# Patient Record
Sex: Male | Born: 1940
Health system: Southern US, Community
[De-identification: ages and names within clinical notes are randomized; demographics above are authoritative.]

## PROBLEM LIST (undated history)

## (undated) DIAGNOSIS — K5790 Diverticulosis of intestine, part unspecified, without perforation or abscess without bleeding: Secondary | ICD-10-CM

## (undated) DIAGNOSIS — H269 Unspecified cataract: Secondary | ICD-10-CM

## (undated) DIAGNOSIS — I2510 Atherosclerotic heart disease of native coronary artery without angina pectoris: Secondary | ICD-10-CM

## (undated) DIAGNOSIS — R7303 Prediabetes: Secondary | ICD-10-CM

## (undated) DIAGNOSIS — F419 Anxiety disorder, unspecified: Secondary | ICD-10-CM

## (undated) DIAGNOSIS — K529 Noninfective gastroenteritis and colitis, unspecified: Secondary | ICD-10-CM

## (undated) DIAGNOSIS — M109 Gout, unspecified: Secondary | ICD-10-CM

## (undated) DIAGNOSIS — J849 Interstitial pulmonary disease, unspecified: Secondary | ICD-10-CM

## (undated) DIAGNOSIS — Z8619 Personal history of other infectious and parasitic diseases: Secondary | ICD-10-CM

## (undated) DIAGNOSIS — M1990 Unspecified osteoarthritis, unspecified site: Secondary | ICD-10-CM

## (undated) DIAGNOSIS — Z87442 Personal history of urinary calculi: Secondary | ICD-10-CM

## (undated) DIAGNOSIS — K141 Geographic tongue: Secondary | ICD-10-CM

## (undated) DIAGNOSIS — E039 Hypothyroidism, unspecified: Secondary | ICD-10-CM

## (undated) DIAGNOSIS — I739 Peripheral vascular disease, unspecified: Secondary | ICD-10-CM

## (undated) DIAGNOSIS — Z87898 Personal history of other specified conditions: Secondary | ICD-10-CM

## (undated) DIAGNOSIS — L509 Urticaria, unspecified: Secondary | ICD-10-CM

## (undated) DIAGNOSIS — Z9861 Coronary angioplasty status: Secondary | ICD-10-CM

## (undated) DIAGNOSIS — R000 Tachycardia, unspecified: Secondary | ICD-10-CM

## (undated) DIAGNOSIS — Z8719 Personal history of other diseases of the digestive system: Secondary | ICD-10-CM

## (undated) DIAGNOSIS — I219 Acute myocardial infarction, unspecified: Secondary | ICD-10-CM

## (undated) DIAGNOSIS — T783XXA Angioneurotic edema, initial encounter: Secondary | ICD-10-CM

## (undated) DIAGNOSIS — N400 Enlarged prostate without lower urinary tract symptoms: Secondary | ICD-10-CM

## (undated) DIAGNOSIS — Z9889 Other specified postprocedural states: Secondary | ICD-10-CM

## (undated) DIAGNOSIS — I10 Essential (primary) hypertension: Secondary | ICD-10-CM

## (undated) DIAGNOSIS — K219 Gastro-esophageal reflux disease without esophagitis: Secondary | ICD-10-CM

## (undated) DIAGNOSIS — E7800 Pure hypercholesterolemia, unspecified: Secondary | ICD-10-CM

## (undated) DIAGNOSIS — C4490 Unspecified malignant neoplasm of skin, unspecified: Secondary | ICD-10-CM

## (undated) DIAGNOSIS — C61 Malignant neoplasm of prostate: Secondary | ICD-10-CM

## (undated) DIAGNOSIS — E118 Type 2 diabetes mellitus with unspecified complications: Secondary | ICD-10-CM

## (undated) DIAGNOSIS — E079 Disorder of thyroid, unspecified: Secondary | ICD-10-CM

## (undated) DIAGNOSIS — I499 Cardiac arrhythmia, unspecified: Secondary | ICD-10-CM

## (undated) DIAGNOSIS — Z8546 Personal history of malignant neoplasm of prostate: Secondary | ICD-10-CM

## (undated) DIAGNOSIS — K5731 Diverticulosis of large intestine without perforation or abscess with bleeding: Secondary | ICD-10-CM

## (undated) HISTORY — DX: Anxiety disorder, unspecified: F41.9

## (undated) HISTORY — DX: Tachycardia, unspecified: R00.0

## (undated) HISTORY — DX: Diverticulosis of large intestine without perforation or abscess with bleeding: K57.31

## (undated) HISTORY — DX: Noninfective gastroenteritis and colitis, unspecified: K52.9

## (undated) HISTORY — DX: Personal history of other specified conditions: Z87.898

## (undated) HISTORY — DX: Atherosclerotic heart disease of native coronary artery without angina pectoris: I25.10

## (undated) HISTORY — DX: Diverticulosis of intestine, part unspecified, without perforation or abscess without bleeding: K57.90

## (undated) HISTORY — DX: Personal history of malignant neoplasm of prostate: Z85.46

## (undated) HISTORY — DX: Coronary angioplasty status: Z98.61

## (undated) HISTORY — DX: Unspecified cataract: H26.9

## (undated) HISTORY — PX: CATARACT EXTRACTION, BILATERAL: SHX1313

## (undated) HISTORY — DX: Acute myocardial infarction, unspecified: I21.9

## (undated) HISTORY — PX: APPENDECTOMY: SHX54

## (undated) HISTORY — DX: Personal history of other diseases of the digestive system: Z87.19

## (undated) HISTORY — PX: NASAL SINUS SURGERY: SHX719

## (undated) HISTORY — PX: PROSTATE BIOPSY: SHX241

## (undated) HISTORY — DX: Geographic tongue: K14.1

## (undated) HISTORY — PX: BACK SURGERY: SHX140

## (undated) HISTORY — DX: Angioneurotic edema, initial encounter: T78.3XXA

## (undated) HISTORY — DX: Urticaria, unspecified: L50.9

## (undated) HISTORY — DX: Essential (primary) hypertension: I10

## (undated) HISTORY — DX: Type 2 diabetes mellitus with unspecified complications: E11.8

## (undated) HISTORY — DX: Malignant neoplasm of prostate: C61

## (undated) HISTORY — DX: Unspecified osteoarthritis, unspecified site: M19.90

## (undated) HISTORY — DX: Gout, unspecified: M10.9

## (undated) HISTORY — PX: OTHER SURGICAL HISTORY: SHX169

---

## 2011-05-09 DIAGNOSIS — H259 Unspecified age-related cataract: Secondary | ICD-10-CM | POA: Diagnosis not present

## 2011-06-20 DIAGNOSIS — E039 Hypothyroidism, unspecified: Secondary | ICD-10-CM | POA: Diagnosis not present

## 2011-06-20 DIAGNOSIS — K589 Irritable bowel syndrome without diarrhea: Secondary | ICD-10-CM | POA: Diagnosis not present

## 2011-06-20 DIAGNOSIS — E785 Hyperlipidemia, unspecified: Secondary | ICD-10-CM | POA: Diagnosis not present

## 2011-06-20 DIAGNOSIS — Z6829 Body mass index (BMI) 29.0-29.9, adult: Secondary | ICD-10-CM | POA: Diagnosis not present

## 2011-07-31 DIAGNOSIS — Z6827 Body mass index (BMI) 27.0-27.9, adult: Secondary | ICD-10-CM | POA: Diagnosis not present

## 2011-07-31 DIAGNOSIS — J019 Acute sinusitis, unspecified: Secondary | ICD-10-CM | POA: Diagnosis not present

## 2011-07-31 DIAGNOSIS — J309 Allergic rhinitis, unspecified: Secondary | ICD-10-CM | POA: Diagnosis not present

## 2011-08-04 DIAGNOSIS — Z6826 Body mass index (BMI) 26.0-26.9, adult: Secondary | ICD-10-CM | POA: Diagnosis not present

## 2011-08-04 DIAGNOSIS — J209 Acute bronchitis, unspecified: Secondary | ICD-10-CM | POA: Diagnosis not present

## 2011-08-09 DIAGNOSIS — J209 Acute bronchitis, unspecified: Secondary | ICD-10-CM | POA: Diagnosis not present

## 2011-08-09 DIAGNOSIS — K1370 Unspecified lesions of oral mucosa: Secondary | ICD-10-CM | POA: Diagnosis not present

## 2011-08-09 DIAGNOSIS — Z6826 Body mass index (BMI) 26.0-26.9, adult: Secondary | ICD-10-CM | POA: Diagnosis not present

## 2011-08-15 DIAGNOSIS — K5900 Constipation, unspecified: Secondary | ICD-10-CM | POA: Diagnosis not present

## 2011-08-30 DIAGNOSIS — R079 Chest pain, unspecified: Secondary | ICD-10-CM | POA: Diagnosis not present

## 2011-08-30 DIAGNOSIS — J209 Acute bronchitis, unspecified: Secondary | ICD-10-CM | POA: Diagnosis not present

## 2011-08-31 DIAGNOSIS — Z6826 Body mass index (BMI) 26.0-26.9, adult: Secondary | ICD-10-CM | POA: Diagnosis not present

## 2011-08-31 DIAGNOSIS — R079 Chest pain, unspecified: Secondary | ICD-10-CM | POA: Diagnosis not present

## 2011-09-27 DIAGNOSIS — K1370 Unspecified lesions of oral mucosa: Secondary | ICD-10-CM | POA: Diagnosis not present

## 2011-09-27 DIAGNOSIS — Z6826 Body mass index (BMI) 26.0-26.9, adult: Secondary | ICD-10-CM | POA: Diagnosis not present

## 2011-11-07 DIAGNOSIS — K1370 Unspecified lesions of oral mucosa: Secondary | ICD-10-CM | POA: Diagnosis not present

## 2011-11-07 DIAGNOSIS — Z6827 Body mass index (BMI) 27.0-27.9, adult: Secondary | ICD-10-CM | POA: Diagnosis not present

## 2011-11-07 DIAGNOSIS — B370 Candidal stomatitis: Secondary | ICD-10-CM | POA: Diagnosis not present

## 2011-12-18 DIAGNOSIS — J384 Edema of larynx: Secondary | ICD-10-CM | POA: Diagnosis not present

## 2011-12-18 DIAGNOSIS — J387 Other diseases of larynx: Secondary | ICD-10-CM | POA: Diagnosis not present

## 2011-12-18 DIAGNOSIS — K1370 Unspecified lesions of oral mucosa: Secondary | ICD-10-CM | POA: Diagnosis not present

## 2011-12-18 DIAGNOSIS — Z8619 Personal history of other infectious and parasitic diseases: Secondary | ICD-10-CM | POA: Diagnosis not present

## 2011-12-18 DIAGNOSIS — R490 Dysphonia: Secondary | ICD-10-CM | POA: Diagnosis not present

## 2012-01-25 DIAGNOSIS — Z79899 Other long term (current) drug therapy: Secondary | ICD-10-CM | POA: Diagnosis not present

## 2012-01-25 DIAGNOSIS — Z0000 Encounter for general adult medical examination without abnormal findings: Secondary | ICD-10-CM | POA: Diagnosis not present

## 2012-01-25 DIAGNOSIS — Z125 Encounter for screening for malignant neoplasm of prostate: Secondary | ICD-10-CM | POA: Diagnosis not present

## 2012-01-25 DIAGNOSIS — E785 Hyperlipidemia, unspecified: Secondary | ICD-10-CM | POA: Diagnosis not present

## 2012-01-25 DIAGNOSIS — R7309 Other abnormal glucose: Secondary | ICD-10-CM | POA: Diagnosis not present

## 2012-01-25 DIAGNOSIS — Z1212 Encounter for screening for malignant neoplasm of rectum: Secondary | ICD-10-CM | POA: Diagnosis not present

## 2012-01-25 DIAGNOSIS — E039 Hypothyroidism, unspecified: Secondary | ICD-10-CM | POA: Diagnosis not present

## 2012-01-29 DIAGNOSIS — L821 Other seborrheic keratosis: Secondary | ICD-10-CM | POA: Diagnosis not present

## 2012-01-29 DIAGNOSIS — L578 Other skin changes due to chronic exposure to nonionizing radiation: Secondary | ICD-10-CM | POA: Diagnosis not present

## 2012-01-29 DIAGNOSIS — L570 Actinic keratosis: Secondary | ICD-10-CM | POA: Diagnosis not present

## 2012-05-09 DIAGNOSIS — R112 Nausea with vomiting, unspecified: Secondary | ICD-10-CM | POA: Diagnosis not present

## 2012-05-09 DIAGNOSIS — E039 Hypothyroidism, unspecified: Secondary | ICD-10-CM | POA: Diagnosis not present

## 2012-05-09 DIAGNOSIS — A045 Campylobacter enteritis: Secondary | ICD-10-CM | POA: Diagnosis not present

## 2012-05-09 DIAGNOSIS — R1115 Cyclical vomiting syndrome unrelated to migraine: Secondary | ICD-10-CM | POA: Diagnosis not present

## 2012-05-09 DIAGNOSIS — R110 Nausea: Secondary | ICD-10-CM | POA: Diagnosis not present

## 2012-05-09 DIAGNOSIS — K219 Gastro-esophageal reflux disease without esophagitis: Secondary | ICD-10-CM | POA: Diagnosis not present

## 2012-05-09 DIAGNOSIS — R5381 Other malaise: Secondary | ICD-10-CM | POA: Diagnosis not present

## 2012-05-09 DIAGNOSIS — R1084 Generalized abdominal pain: Secondary | ICD-10-CM | POA: Diagnosis not present

## 2012-05-09 DIAGNOSIS — A0472 Enterocolitis due to Clostridium difficile, not specified as recurrent: Secondary | ICD-10-CM | POA: Diagnosis not present

## 2012-05-09 DIAGNOSIS — K589 Irritable bowel syndrome without diarrhea: Secondary | ICD-10-CM | POA: Diagnosis not present

## 2012-05-09 DIAGNOSIS — R911 Solitary pulmonary nodule: Secondary | ICD-10-CM | POA: Diagnosis not present

## 2012-05-09 DIAGNOSIS — E876 Hypokalemia: Secondary | ICD-10-CM | POA: Diagnosis not present

## 2012-05-09 DIAGNOSIS — E860 Dehydration: Secondary | ICD-10-CM | POA: Diagnosis not present

## 2012-05-10 DIAGNOSIS — R1084 Generalized abdominal pain: Secondary | ICD-10-CM | POA: Diagnosis not present

## 2012-05-10 DIAGNOSIS — A0472 Enterocolitis due to Clostridium difficile, not specified as recurrent: Secondary | ICD-10-CM | POA: Diagnosis not present

## 2012-05-10 DIAGNOSIS — E039 Hypothyroidism, unspecified: Secondary | ICD-10-CM | POA: Diagnosis not present

## 2012-05-10 DIAGNOSIS — K589 Irritable bowel syndrome without diarrhea: Secondary | ICD-10-CM | POA: Diagnosis present

## 2012-05-10 DIAGNOSIS — R5381 Other malaise: Secondary | ICD-10-CM | POA: Diagnosis not present

## 2012-05-10 DIAGNOSIS — R1115 Cyclical vomiting syndrome unrelated to migraine: Secondary | ICD-10-CM | POA: Diagnosis not present

## 2012-05-10 DIAGNOSIS — A09 Infectious gastroenteritis and colitis, unspecified: Secondary | ICD-10-CM | POA: Diagnosis not present

## 2012-05-10 DIAGNOSIS — R197 Diarrhea, unspecified: Secondary | ICD-10-CM | POA: Diagnosis not present

## 2012-05-10 DIAGNOSIS — Z79899 Other long term (current) drug therapy: Secondary | ICD-10-CM | POA: Diagnosis not present

## 2012-05-10 DIAGNOSIS — K219 Gastro-esophageal reflux disease without esophagitis: Secondary | ICD-10-CM | POA: Diagnosis not present

## 2012-05-10 DIAGNOSIS — R112 Nausea with vomiting, unspecified: Secondary | ICD-10-CM | POA: Diagnosis not present

## 2012-05-10 DIAGNOSIS — K432 Incisional hernia without obstruction or gangrene: Secondary | ICD-10-CM | POA: Diagnosis present

## 2012-05-10 DIAGNOSIS — A045 Campylobacter enteritis: Secondary | ICD-10-CM | POA: Diagnosis not present

## 2012-05-10 DIAGNOSIS — N210 Calculus in bladder: Secondary | ICD-10-CM | POA: Diagnosis present

## 2012-05-10 DIAGNOSIS — E785 Hyperlipidemia, unspecified: Secondary | ICD-10-CM | POA: Diagnosis present

## 2012-05-10 DIAGNOSIS — E860 Dehydration: Secondary | ICD-10-CM | POA: Diagnosis present

## 2012-05-10 DIAGNOSIS — E876 Hypokalemia: Secondary | ICD-10-CM | POA: Diagnosis not present

## 2012-05-10 DIAGNOSIS — Z86010 Personal history of colonic polyps: Secondary | ICD-10-CM | POA: Diagnosis not present

## 2012-05-10 DIAGNOSIS — R110 Nausea: Secondary | ICD-10-CM | POA: Diagnosis not present

## 2012-05-10 DIAGNOSIS — R911 Solitary pulmonary nodule: Secondary | ICD-10-CM | POA: Diagnosis not present

## 2012-05-23 DIAGNOSIS — Z6826 Body mass index (BMI) 26.0-26.9, adult: Secondary | ICD-10-CM | POA: Diagnosis not present

## 2012-05-23 DIAGNOSIS — K5289 Other specified noninfective gastroenteritis and colitis: Secondary | ICD-10-CM | POA: Diagnosis not present

## 2012-05-23 DIAGNOSIS — K1370 Unspecified lesions of oral mucosa: Secondary | ICD-10-CM | POA: Diagnosis not present

## 2012-05-23 DIAGNOSIS — G4700 Insomnia, unspecified: Secondary | ICD-10-CM | POA: Diagnosis not present

## 2012-06-04 DIAGNOSIS — Z1211 Encounter for screening for malignant neoplasm of colon: Secondary | ICD-10-CM | POA: Diagnosis not present

## 2012-06-04 DIAGNOSIS — A0472 Enterocolitis due to Clostridium difficile, not specified as recurrent: Secondary | ICD-10-CM | POA: Diagnosis not present

## 2012-06-04 DIAGNOSIS — K589 Irritable bowel syndrome without diarrhea: Secondary | ICD-10-CM | POA: Diagnosis not present

## 2012-06-04 DIAGNOSIS — Z86010 Personal history of colonic polyps: Secondary | ICD-10-CM | POA: Diagnosis not present

## 2012-06-27 DIAGNOSIS — M79609 Pain in unspecified limb: Secondary | ICD-10-CM | POA: Diagnosis not present

## 2012-06-27 DIAGNOSIS — G4700 Insomnia, unspecified: Secondary | ICD-10-CM | POA: Diagnosis not present

## 2012-06-27 DIAGNOSIS — R7309 Other abnormal glucose: Secondary | ICD-10-CM | POA: Diagnosis not present

## 2012-06-27 DIAGNOSIS — E785 Hyperlipidemia, unspecified: Secondary | ICD-10-CM | POA: Diagnosis not present

## 2012-07-01 DIAGNOSIS — M24573 Contracture, unspecified ankle: Secondary | ICD-10-CM | POA: Diagnosis not present

## 2012-07-01 DIAGNOSIS — M722 Plantar fascial fibromatosis: Secondary | ICD-10-CM | POA: Diagnosis not present

## 2012-07-01 DIAGNOSIS — M24576 Contracture, unspecified foot: Secondary | ICD-10-CM | POA: Diagnosis not present

## 2012-07-01 DIAGNOSIS — M7730 Calcaneal spur, unspecified foot: Secondary | ICD-10-CM | POA: Diagnosis not present

## 2012-07-01 DIAGNOSIS — M7150 Other bursitis, not elsewhere classified, unspecified site: Secondary | ICD-10-CM | POA: Diagnosis not present

## 2012-07-11 DIAGNOSIS — K5730 Diverticulosis of large intestine without perforation or abscess without bleeding: Secondary | ICD-10-CM | POA: Diagnosis not present

## 2012-07-11 DIAGNOSIS — E039 Hypothyroidism, unspecified: Secondary | ICD-10-CM | POA: Diagnosis not present

## 2012-07-11 DIAGNOSIS — Z1211 Encounter for screening for malignant neoplasm of colon: Secondary | ICD-10-CM | POA: Diagnosis not present

## 2012-07-11 DIAGNOSIS — D126 Benign neoplasm of colon, unspecified: Secondary | ICD-10-CM | POA: Diagnosis not present

## 2012-07-11 DIAGNOSIS — Z86010 Personal history of colonic polyps: Secondary | ICD-10-CM | POA: Diagnosis not present

## 2012-07-11 DIAGNOSIS — R197 Diarrhea, unspecified: Secondary | ICD-10-CM | POA: Diagnosis not present

## 2012-07-11 DIAGNOSIS — E785 Hyperlipidemia, unspecified: Secondary | ICD-10-CM | POA: Diagnosis not present

## 2012-07-11 DIAGNOSIS — Z79899 Other long term (current) drug therapy: Secondary | ICD-10-CM | POA: Diagnosis not present

## 2012-07-11 DIAGNOSIS — K648 Other hemorrhoids: Secondary | ICD-10-CM | POA: Diagnosis not present

## 2012-07-11 DIAGNOSIS — K589 Irritable bowel syndrome without diarrhea: Secondary | ICD-10-CM | POA: Diagnosis not present

## 2012-08-08 DIAGNOSIS — L988 Other specified disorders of the skin and subcutaneous tissue: Secondary | ICD-10-CM | POA: Diagnosis not present

## 2012-08-08 DIAGNOSIS — Z6826 Body mass index (BMI) 26.0-26.9, adult: Secondary | ICD-10-CM | POA: Diagnosis not present

## 2012-08-08 DIAGNOSIS — R21 Rash and other nonspecific skin eruption: Secondary | ICD-10-CM | POA: Diagnosis not present

## 2012-09-06 DIAGNOSIS — Z6826 Body mass index (BMI) 26.0-26.9, adult: Secondary | ICD-10-CM | POA: Diagnosis not present

## 2012-09-06 DIAGNOSIS — R21 Rash and other nonspecific skin eruption: Secondary | ICD-10-CM | POA: Diagnosis not present

## 2012-09-09 DIAGNOSIS — I8310 Varicose veins of unspecified lower extremity with inflammation: Secondary | ICD-10-CM | POA: Diagnosis not present

## 2012-09-09 DIAGNOSIS — R609 Edema, unspecified: Secondary | ICD-10-CM | POA: Diagnosis not present

## 2012-09-14 DIAGNOSIS — L259 Unspecified contact dermatitis, unspecified cause: Secondary | ICD-10-CM | POA: Diagnosis not present

## 2012-09-24 DIAGNOSIS — Z6826 Body mass index (BMI) 26.0-26.9, adult: Secondary | ICD-10-CM | POA: Diagnosis not present

## 2012-09-24 DIAGNOSIS — R197 Diarrhea, unspecified: Secondary | ICD-10-CM | POA: Diagnosis not present

## 2012-10-22 DIAGNOSIS — A0472 Enterocolitis due to Clostridium difficile, not specified as recurrent: Secondary | ICD-10-CM | POA: Diagnosis not present

## 2012-10-22 DIAGNOSIS — K589 Irritable bowel syndrome without diarrhea: Secondary | ICD-10-CM | POA: Diagnosis not present

## 2012-10-22 DIAGNOSIS — K648 Other hemorrhoids: Secondary | ICD-10-CM | POA: Diagnosis not present

## 2012-10-27 ENCOUNTER — Emergency Department (HOSPITAL_COMMUNITY): Payer: Medicare Other

## 2012-10-27 ENCOUNTER — Emergency Department (HOSPITAL_COMMUNITY)
Admission: EM | Admit: 2012-10-27 | Discharge: 2012-10-28 | Disposition: A | Discharge status: Home / Self Care | Payer: Medicare Other | Attending: Emergency Medicine | Admitting: Emergency Medicine

## 2012-10-27 ENCOUNTER — Encounter (HOSPITAL_COMMUNITY): Payer: Self-pay | Admitting: Emergency Medicine

## 2012-10-27 DIAGNOSIS — K219 Gastro-esophageal reflux disease without esophagitis: Secondary | ICD-10-CM | POA: Diagnosis not present

## 2012-10-27 DIAGNOSIS — R5381 Other malaise: Secondary | ICD-10-CM | POA: Insufficient documentation

## 2012-10-27 DIAGNOSIS — R109 Unspecified abdominal pain: Secondary | ICD-10-CM

## 2012-10-27 DIAGNOSIS — E079 Disorder of thyroid, unspecified: Secondary | ICD-10-CM | POA: Diagnosis not present

## 2012-10-27 DIAGNOSIS — E7800 Pure hypercholesterolemia, unspecified: Secondary | ICD-10-CM | POA: Insufficient documentation

## 2012-10-27 DIAGNOSIS — R059 Cough, unspecified: Secondary | ICD-10-CM | POA: Insufficient documentation

## 2012-10-27 DIAGNOSIS — K4090 Unilateral inguinal hernia, without obstruction or gangrene, not specified as recurrent: Secondary | ICD-10-CM | POA: Diagnosis not present

## 2012-10-27 DIAGNOSIS — R05 Cough: Secondary | ICD-10-CM | POA: Insufficient documentation

## 2012-10-27 DIAGNOSIS — R5383 Other fatigue: Secondary | ICD-10-CM | POA: Insufficient documentation

## 2012-10-27 DIAGNOSIS — R079 Chest pain, unspecified: Secondary | ICD-10-CM | POA: Diagnosis not present

## 2012-10-27 DIAGNOSIS — IMO0002 Reserved for concepts with insufficient information to code with codable children: Secondary | ICD-10-CM | POA: Insufficient documentation

## 2012-10-27 DIAGNOSIS — R198 Other specified symptoms and signs involving the digestive system and abdomen: Secondary | ICD-10-CM | POA: Diagnosis not present

## 2012-10-27 DIAGNOSIS — R0789 Other chest pain: Secondary | ICD-10-CM | POA: Insufficient documentation

## 2012-10-27 DIAGNOSIS — N201 Calculus of ureter: Secondary | ICD-10-CM | POA: Diagnosis not present

## 2012-10-27 DIAGNOSIS — R1084 Generalized abdominal pain: Secondary | ICD-10-CM | POA: Diagnosis not present

## 2012-10-27 DIAGNOSIS — Z79899 Other long term (current) drug therapy: Secondary | ICD-10-CM | POA: Diagnosis not present

## 2012-10-27 HISTORY — DX: Disorder of thyroid, unspecified: E07.9

## 2012-10-27 HISTORY — DX: Gastro-esophageal reflux disease without esophagitis: K21.9

## 2012-10-27 HISTORY — DX: Benign prostatic hyperplasia without lower urinary tract symptoms: N40.0

## 2012-10-27 HISTORY — DX: Pure hypercholesterolemia, unspecified: E78.00

## 2012-10-27 LAB — HEPATIC FUNCTION PANEL
ALT: 26 U/L (ref 0–53)
AST: 24 U/L (ref 0–37)
Indirect Bilirubin: 0.4 mg/dL (ref 0.3–0.9)
Total Protein: 6.5 g/dL (ref 6.0–8.3)

## 2012-10-27 LAB — URINE MICROSCOPIC-ADD ON

## 2012-10-27 LAB — CBC
HCT: 48.4 % (ref 39.0–52.0)
Hemoglobin: 17 g/dL (ref 13.0–17.0)
MCH: 32.1 pg (ref 26.0–34.0)
MCHC: 35.1 g/dL (ref 30.0–36.0)
MCV: 91.5 fL (ref 78.0–100.0)
Platelets: 152 K/uL (ref 150–400)
RBC: 5.29 MIL/uL (ref 4.22–5.81)
RDW: 12.5 % (ref 11.5–15.5)
WBC: 6.5 10*3/uL (ref 4.0–10.5)

## 2012-10-27 LAB — BASIC METABOLIC PANEL
BUN: 16 mg/dL (ref 6–23)
CO2: 25 mEq/L (ref 19–32)
Chloride: 102 mEq/L (ref 96–112)
Creatinine, Ser: 1.01 mg/dL (ref 0.50–1.35)

## 2012-10-27 LAB — BASIC METABOLIC PANEL WITH GFR
Calcium: 9.1 mg/dL (ref 8.4–10.5)
GFR calc Af Amer: 82 mL/min — ABNORMAL LOW (ref >90)
GFR calc non Af Amer: 71 mL/min — ABNORMAL LOW (ref >90)
Glucose, Bld: 122 mg/dL — ABNORMAL HIGH (ref 70–99)
Potassium: 3.9 meq/L (ref 3.5–5.1)
Sodium: 136 meq/L (ref 135–145)

## 2012-10-27 LAB — LIPASE, BLOOD: Lipase: 30 U/L (ref 11–59)

## 2012-10-27 LAB — PRO B NATRIURETIC PEPTIDE: Pro B Natriuretic peptide (BNP): 35.7 pg/mL (ref 0–125)

## 2012-10-27 LAB — URINALYSIS, ROUTINE W REFLEX MICROSCOPIC
Bilirubin Urine: NEGATIVE
Hgb urine dipstick: NEGATIVE
Nitrite: NEGATIVE
Specific Gravity, Urine: 1.01 (ref 1.005–1.030)
pH: 7.5 (ref 5.0–8.0)

## 2012-10-27 MED ORDER — IOHEXOL 300 MG/ML  SOLN
100.0000 mL | Freq: Once | INTRAMUSCULAR | Status: AC | PRN
  Administered 2012-10-27: 100 mL via INTRAVENOUS

## 2012-10-27 NOTE — ED Notes (Signed)
PT. REPORTS LEFT LOWER CHEST PAIN WITH SLIGHT SOB , OCCASIONAL PRODUCTIVE COUGH ONSET LAST NIGHT , DENIES NAUSEA OR DIAPHORESIS .

## 2012-10-27 NOTE — ED Provider Notes (Signed)
CSN: [id number]     Arrival date & time 10/27/12  2053 History     First MD Initiated Contact with Patient 10/27/12 2204     Chief Complaint  Patient presents with  . Chest Pain   (Consider location/radiation/quality/duration/timing/severity/associated sxs/prior Treatment) Patient is a 72 y.o. male presenting with abdominal pain. The history is provided by the patient and the spouse.  Abdominal Pain This is a new (Left upper quadrant) problem. The current episode started yesterday (Last night). The problem occurs constantly. The problem has been gradually worsening. Associated symptoms include abdominal pain, a change in bowel habit (Patient took Maalox to improve symptoms, but has had no relief and now has soft stool), chest pain and coughing. Pertinent negatives include no anorexia, arthralgias, chills, congestion, diaphoresis, fatigue, fever, headaches, joint swelling, myalgias, nausea, neck pain, numbness, rash, sore throat, swollen glands, urinary symptoms, visual change, vomiting or weakness. Nothing aggravates the symptoms. He has tried nothing for the symptoms.    Past Medical History  Diagnosis Date  . Thyroid disease   . Prostate enlargement   . GERD (gastroesophageal reflux disease)   . High cholesterol    History reviewed. No pertinent past surgical history. No family history on file. History  Substance Use Topics  . Smoking status: Never Smoker   . Smokeless tobacco: Not on file  . Alcohol Use: No    Review of Systems  Constitutional: Negative for fever, chills, diaphoresis, activity change, appetite change and fatigue.  HENT: Negative for congestion, sore throat, rhinorrhea, sneezing, trouble swallowing and neck pain.   Eyes: Negative for pain and redness.  Respiratory: Positive for cough and chest tightness. Negative for choking, shortness of breath, wheezing and stridor.   Cardiovascular: Positive for chest pain. Negative for leg swelling.  Gastrointestinal:  Positive for abdominal pain and change in bowel habit (Patient took Maalox to improve symptoms, but has had no relief and now has soft stool). Negative for nausea, vomiting, diarrhea, constipation, blood in stool, abdominal distention, anal bleeding and anorexia.  Musculoskeletal: Negative for myalgias, back pain, joint swelling and arthralgias.  Skin: Negative for rash.  Neurological: Negative for dizziness, speech difficulty, weakness, light-headedness, numbness and headaches.  Hematological: Negative for adenopathy.  Psychiatric/Behavioral: Negative for confusion.    Allergies  Benadryl; Darvocet; and Darvon  Home Medications   Current Outpatient Rx  Name  Route  Sig  Dispense  Refill  . dutasteride (AVODART) 0.5 MG capsule   Oral   Take 0.5 mg by mouth every evening.         . fish oil-omega-3 fatty acids 1000 MG capsule   Oral   Take 1 g by mouth daily.         . fluticasone (VERAMYST) 27.5 MCG/SPRAY nasal spray   Nasal   Place 1 spray into the nose daily.         [name] levothyroxine (SYNTHROID, LEVOTHROID) 75 MCG tablet   Oral   Take 75 mcg by mouth daily before breakfast.         . nabumetone (RELAFEN) 750 MG tablet   Oral   Take 750 mg by mouth 2 (two) times daily.         [name] omeprazole (PRILOSEC) 20 MG capsule   Oral   Take 20 mg by mouth daily.         . Pitavastatin Calcium (LIVALO) 4 MG TABS   Oral   Take 4 mg by mouth every evening.         [name]  Probiotic Product (PROBIOTIC FORMULA PO)   Oral   Take 1 capsule by mouth daily.          BP 155/95  Pulse 80  Temp(Src) 98.3 F (36.8 C) (Oral)  Resp 12  SpO2 98% Physical Exam  Nursing note and vitals reviewed. Constitutional: He is oriented to person, place, and time. He appears well-developed and well-nourished. No distress.  HENT:  Head: Normocephalic and atraumatic.  Eyes: Conjunctivae and EOM are normal. Right eye exhibits no discharge. Left eye exhibits no discharge.  Neck: Normal range  of motion. Neck supple. No tracheal deviation present.  Cardiovascular: Normal rate, regular rhythm and normal heart sounds.  Exam reveals no friction rub.   No murmur heard. Pulmonary/Chest: Effort normal and breath sounds normal. No stridor. No respiratory distress. He has no wheezes. He has no rales. He exhibits no tenderness.  Abdominal: Soft. He exhibits no distension. There is tenderness (Generalized). There is guarding (voluntary). There is no rebound.  Neurological: He is alert and oriented to person, place, and time.  Skin: Skin is warm.  Psychiatric: He has a normal mood and affect.    ED Course   Procedures (including critical care time)  Labs Reviewed  BASIC METABOLIC PANEL - Abnormal; Notable for the following:    Glucose, Bld 122 (*)    GFR calc non Af Amer 71 (*)    GFR calc Af Amer 82 (*)    All other components within normal limits  URINALYSIS, ROUTINE W REFLEX MICROSCOPIC - Abnormal; Notable for the following:    APPearance CLOUDY (*)    Leukocytes, UA MODERATE (*)    All other components within normal limits  URINE MICROSCOPIC-ADD ON - Abnormal; Notable for the following:    Squamous Epithelial / LPF FEW (*)    Bacteria, UA FEW (*)    All other components within normal limits  URINE CULTURE  CBC  PRO B NATRIURETIC PEPTIDE  LIPASE, BLOOD  HEPATIC FUNCTION PANEL  POCT I-STAT TROPONIN I   Results for orders placed during the hospital encounter of 10/27/12  CBC      Result Value Range   WBC 6.5  4.0 - 10.5 K/uL   RBC 5.29  4.22 - 5.81 MIL/uL   Hemoglobin 17.0  13.0 - 17.0 g/dL   HCT 65.7  84.6 - 96.2 %   MCV 91.5  78.0 - 100.0 fL   MCH 32.1  26.0 - 34.0 pg   MCHC 35.1  30.0 - 36.0 g/dL   RDW 95.2  84.1 - 32.4 %   Platelets 152  150 - 400 K/uL  BASIC METABOLIC PANEL      Result Value Range   Sodium 136  135 - 145 mEq/L   Potassium 3.9  3.5 - 5.1 mEq/L   Chloride 102  96 - 112 mEq/L   CO2 25  19 - 32 mEq/L   Glucose, Bld 122 (*) 70 - 99 mg/dL   BUN  16  6 - 23 mg/dL   Creatinine, Ser 4.01  0.50 - 1.35 mg/dL   Calcium 9.1  8.4 - 02.7 mg/dL   GFR calc non Af Amer 71 (*) >90 mL/min   GFR calc Af Amer 82 (*) >90 mL/min  PRO B NATRIURETIC PEPTIDE      Result Value Range   Pro B Natriuretic peptide (BNP) 35.7  0 - 125 pg/mL  LIPASE, BLOOD      Result Value Range   Lipase 30  11 -  59 U/L  HEPATIC FUNCTION PANEL      Result Value Range   Total Protein 6.5  6.0 - 8.3 g/dL   Albumin 3.9  3.5 - 5.2 g/dL   AST 24  0 - 37 U/L   Herrera 26  0 - 53 U/L   Alkaline Phosphatase 83  39 - 117 U/L   Total Bilirubin 0.5  0.3 - 1.2 mg/dL   Bilirubin, Direct 0.1  0.0 - 0.3 mg/dL   Indirect Bilirubin 0.4  0.3 - 0.9 mg/dL  URINALYSIS, ROUTINE W REFLEX MICROSCOPIC      Result Value Range   Color, Urine YELLOW  YELLOW   APPearance CLOUDY (*) CLEAR   Specific Gravity, Urine 1.010  1.005 - 1.030   pH 7.5  5.0 - 8.0   Glucose, UA NEGATIVE  NEGATIVE mg/dL   Hgb urine dipstick NEGATIVE  NEGATIVE   Bilirubin Urine NEGATIVE  NEGATIVE   Ketones, ur NEGATIVE  NEGATIVE mg/dL   Protein, ur NEGATIVE  NEGATIVE mg/dL   Urobilinogen, UA 0.2  0.0 - 1.0 mg/dL   Nitrite NEGATIVE  NEGATIVE   Leukocytes, UA MODERATE (*) NEGATIVE  URINE MICROSCOPIC-ADD ON      Result Value Range   Squamous Epithelial / LPF FEW (*) RARE   WBC, UA 7-10  <3 WBC/hpf   RBC / HPF 0-2  <3 RBC/hpf   Bacteria, UA FEW (*) RARE  POCT I-STAT TROPONIN I      Result Value Range   Troponin i, poc 0.01  0.00 - 0.08 ng/mL   Comment 3             Dg Chest 2 View  10/27/2012   *RADIOLOGY REPORT*  Clinical Data: The chest and epigastric pain  CHEST - 2 VIEW  Comparison: None.  Findings:  Normal cardiac silhouette and mediastinal contours.  The lungs appear mildly hyperexpanded with flattening of the bilateral hemidiaphragms.  There is grossly symmetric biapical pleural parenchymal thickening.  No focal airspace opacity.  No pleural effusion or pneumothorax.  No evidence of edema.  No acute osseous  abnormalities.  Post right-sided rotator cuff repair  IMPRESSION: 1.  Mild hyperexpansion without acute cardiopulmonary disease. 2.  Grossly symmetric biapical pleural parenchymal thickening.   Original Report Authenticated By: Tacey Ruiz, MD   No diagnosis found.   Date: 10/27/2012  Rate: 98  Rhythm: normal sinus rhythm  QRS Axis: normal  Intervals: normal  ST/T Wave abnormalities: nonspecific ST/T changes  Conduction Disutrbances:none  Narrative Interpretation: NSR with nonspecific changes  Old EKG Reviewed: none available    MDM  Daniel Herrera 72 y.o. presents with weakness and left upper quadrant abdominal pain. Patient is asymptomatic. Cardiac risk factors include age and high cholesterol. Afebrile vital signs otherwise stable. Generalized abdominal tenderness palpation was some voluntary guarding. No rebound. Lipase negative. UA negative for UTI. White blood cell count within normal limits. Will obtain CT scan to rule out acute intra-abdominal etiology of symptoms.   CT scan not definitively suggestive of patient's symptoms. Stone noted in the bladder, which could have passed suggestive of his symptoms. Also noted some changes that could be consistent with an enteritis. No obstruction noted. Patient given Bentyl with improvement of symptoms. Repeat troponin negative. Low suspicion for ACS at this time but work up with cardiology warranted.  After having an extensive discussion with the patient and his family, he indicated that he would prefer to go home and followup with her cardiologist in the  next 48 hours. This is reasonable as patient has minimal risk factors for coronary artery disease and he has a reassuring EKG. he was given strong return cautions including worsening symptoms, chest pain, shortness of breath, or any other alarming or concerning symptoms or issues.  Labs, imaging, EKG reviewed. I dicussed this patient's care with my attending, Dr. Oletta Lamas.  Sena Hitch,  MD 10/28/12 [phone]

## 2012-10-27 NOTE — ED Notes (Signed)
Pt states that the pain he is having started yesterday at 20:00. Pt ate dinner yesterday at 17:00 and then had the pain in his left lower chest, left upper abdomen. Pt states that he tried milk of magnesia and went to the bathroom several times during the night but was still unable to sleep due to the pain. Pt states pain is constant.

## 2012-10-27 NOTE — ED Notes (Signed)
Pt given PO contrast for CT scan, told to call when finished.

## 2012-10-27 NOTE — ED Notes (Signed)
Pt finished CT water, CT aware.

## 2012-10-28 DIAGNOSIS — R109 Unspecified abdominal pain: Secondary | ICD-10-CM | POA: Diagnosis present

## 2012-10-28 LAB — POCT I-STAT TROPONIN I: Troponin i, poc: 0.02 ng/mL (ref 0.00–0.08)

## 2012-10-28 MED ORDER — MORPHINE SULFATE 2 MG/ML IJ SOLN
2.0000 mg | Freq: Once | INTRAMUSCULAR | Status: AC
  Administered 2012-10-28: 2 mg via INTRAVENOUS
  Filled 2012-10-28: qty 1

## 2012-10-28 MED ORDER — DICYCLOMINE HCL 10 MG PO CAPS
10.0000 mg | ORAL_CAPSULE | Freq: Once | ORAL | Status: AC
  Administered 2012-10-28: 10 mg via ORAL
  Filled 2012-10-28: qty 1

## 2012-10-28 MED ORDER — DICYCLOMINE HCL 20 MG PO TABS: 20.0000 mg | ORAL_TABLET | Freq: Two times a day (BID) | ORAL | Status: DC

## 2012-10-28 MED ORDER — MORPHINE SULFATE 4 MG/ML IJ SOLN: 4.0000 mg | Freq: Once | INTRAMUSCULAR | Status: DC

## 2012-10-28 NOTE — ED Provider Notes (Signed)
I saw and evaluated the patient, reviewed the resident's note and I agree with the findings and plan.  I reviewed and agree with ECG interpretation by Dr. Malen Gauze.  Pt with left reproducible upper epigastric pain, mild diffuse abd tenderness including RLQ.  Lungs clear.  No distress, not toxic appearing.  Thus abd/pelvis CT performed, no new acute abn's.  Results discussed with pt and family.  Troponin is negative times 2, serial.  Non specific mild ST depression without reciprocal changes noted.  Discussed observation admission versus close follow up with cardiology, Rx for pain for home.  Pt and family would like to be discharged to home.    Gavin Pound. Oletta Lamas, MD 10/28/12 [phone]

## 2012-10-29 LAB — URINE CULTURE
Colony Count: NO GROWTH
Culture: NO GROWTH

## 2012-11-14 DIAGNOSIS — Z1331 Encounter for screening for depression: Secondary | ICD-10-CM | POA: Diagnosis not present

## 2012-11-14 DIAGNOSIS — Z9181 History of falling: Secondary | ICD-10-CM | POA: Diagnosis not present

## 2012-11-14 DIAGNOSIS — K589 Irritable bowel syndrome without diarrhea: Secondary | ICD-10-CM | POA: Diagnosis not present

## 2012-11-14 DIAGNOSIS — Z6826 Body mass index (BMI) 26.0-26.9, adult: Secondary | ICD-10-CM | POA: Diagnosis not present

## 2012-11-14 DIAGNOSIS — R079 Chest pain, unspecified: Secondary | ICD-10-CM | POA: Diagnosis not present

## 2012-11-26 DIAGNOSIS — R0789 Other chest pain: Secondary | ICD-10-CM | POA: Diagnosis not present

## 2012-11-26 DIAGNOSIS — E7800 Pure hypercholesterolemia, unspecified: Secondary | ICD-10-CM | POA: Diagnosis not present

## 2012-11-26 DIAGNOSIS — R9431 Abnormal electrocardiogram [ECG] [EKG]: Secondary | ICD-10-CM | POA: Diagnosis not present

## 2012-12-10 DIAGNOSIS — G4700 Insomnia, unspecified: Secondary | ICD-10-CM | POA: Diagnosis not present

## 2012-12-10 DIAGNOSIS — Z6826 Body mass index (BMI) 26.0-26.9, adult: Secondary | ICD-10-CM | POA: Diagnosis not present

## 2012-12-18 DIAGNOSIS — R9431 Abnormal electrocardiogram [ECG] [EKG]: Secondary | ICD-10-CM | POA: Diagnosis not present

## 2012-12-18 DIAGNOSIS — R0789 Other chest pain: Secondary | ICD-10-CM | POA: Diagnosis not present

## 2012-12-19 ENCOUNTER — Encounter: Payer: Medicare Other | Admitting: Cardiovascular Disease

## 2012-12-30 DIAGNOSIS — E039 Hypothyroidism, unspecified: Secondary | ICD-10-CM | POA: Diagnosis not present

## 2012-12-30 DIAGNOSIS — E785 Hyperlipidemia, unspecified: Secondary | ICD-10-CM | POA: Diagnosis not present

## 2012-12-30 DIAGNOSIS — Z6826 Body mass index (BMI) 26.0-26.9, adult: Secondary | ICD-10-CM | POA: Diagnosis not present

## 2012-12-30 DIAGNOSIS — K589 Irritable bowel syndrome without diarrhea: Secondary | ICD-10-CM | POA: Diagnosis not present

## 2012-12-30 DIAGNOSIS — R7309 Other abnormal glucose: Secondary | ICD-10-CM | POA: Diagnosis not present

## 2013-05-01 DIAGNOSIS — H52209 Unspecified astigmatism, unspecified eye: Secondary | ICD-10-CM | POA: Diagnosis not present

## 2013-05-01 DIAGNOSIS — H01009 Unspecified blepharitis unspecified eye, unspecified eyelid: Secondary | ICD-10-CM | POA: Diagnosis not present

## 2013-05-01 DIAGNOSIS — H04129 Dry eye syndrome of unspecified lacrimal gland: Secondary | ICD-10-CM | POA: Diagnosis not present

## 2013-05-01 DIAGNOSIS — H259 Unspecified age-related cataract: Secondary | ICD-10-CM | POA: Diagnosis not present

## 2013-05-05 DIAGNOSIS — E785 Hyperlipidemia, unspecified: Secondary | ICD-10-CM | POA: Diagnosis not present

## 2013-05-05 DIAGNOSIS — R197 Diarrhea, unspecified: Secondary | ICD-10-CM | POA: Diagnosis not present

## 2013-05-05 DIAGNOSIS — G4700 Insomnia, unspecified: Secondary | ICD-10-CM | POA: Diagnosis not present

## 2013-05-05 DIAGNOSIS — Z6827 Body mass index (BMI) 27.0-27.9, adult: Secondary | ICD-10-CM | POA: Diagnosis not present

## 2013-05-05 DIAGNOSIS — E039 Hypothyroidism, unspecified: Secondary | ICD-10-CM | POA: Diagnosis not present

## 2013-05-06 DIAGNOSIS — L570 Actinic keratosis: Secondary | ICD-10-CM | POA: Diagnosis not present

## 2013-05-06 DIAGNOSIS — I8310 Varicose veins of unspecified lower extremity with inflammation: Secondary | ICD-10-CM | POA: Diagnosis not present

## 2013-05-06 DIAGNOSIS — L821 Other seborrheic keratosis: Secondary | ICD-10-CM | POA: Diagnosis not present

## 2013-09-10 DIAGNOSIS — L538 Other specified erythematous conditions: Secondary | ICD-10-CM | POA: Diagnosis not present

## 2013-09-10 DIAGNOSIS — B351 Tinea unguium: Secondary | ICD-10-CM | POA: Diagnosis not present

## 2013-09-17 DIAGNOSIS — L538 Other specified erythematous conditions: Secondary | ICD-10-CM | POA: Diagnosis not present

## 2013-10-10 DIAGNOSIS — Z6826 Body mass index (BMI) 26.0-26.9, adult: Secondary | ICD-10-CM | POA: Diagnosis not present

## 2013-10-10 DIAGNOSIS — R7309 Other abnormal glucose: Secondary | ICD-10-CM | POA: Diagnosis not present

## 2013-10-10 DIAGNOSIS — J069 Acute upper respiratory infection, unspecified: Secondary | ICD-10-CM | POA: Diagnosis not present

## 2013-10-10 DIAGNOSIS — J309 Allergic rhinitis, unspecified: Secondary | ICD-10-CM | POA: Diagnosis not present

## 2013-10-10 DIAGNOSIS — E785 Hyperlipidemia, unspecified: Secondary | ICD-10-CM | POA: Diagnosis not present

## 2013-10-16 DIAGNOSIS — I8310 Varicose veins of unspecified lower extremity with inflammation: Secondary | ICD-10-CM | POA: Diagnosis not present

## 2013-10-16 DIAGNOSIS — I83893 Varicose veins of bilateral lower extremities with other complications: Secondary | ICD-10-CM | POA: Diagnosis not present

## 2013-10-16 DIAGNOSIS — I872 Venous insufficiency (chronic) (peripheral): Secondary | ICD-10-CM | POA: Diagnosis not present

## 2013-10-17 DIAGNOSIS — R21 Rash and other nonspecific skin eruption: Secondary | ICD-10-CM | POA: Diagnosis not present

## 2013-10-22 DIAGNOSIS — Z23 Encounter for immunization: Secondary | ICD-10-CM | POA: Diagnosis not present

## 2013-11-10 DIAGNOSIS — L538 Other specified erythematous conditions: Secondary | ICD-10-CM | POA: Diagnosis not present

## 2013-11-10 DIAGNOSIS — L570 Actinic keratosis: Secondary | ICD-10-CM | POA: Diagnosis not present

## 2013-11-10 DIAGNOSIS — L821 Other seborrheic keratosis: Secondary | ICD-10-CM | POA: Diagnosis not present

## 2013-11-10 DIAGNOSIS — L578 Other skin changes due to chronic exposure to nonionizing radiation: Secondary | ICD-10-CM | POA: Diagnosis not present

## 2013-11-24 DIAGNOSIS — R918 Other nonspecific abnormal finding of lung field: Secondary | ICD-10-CM | POA: Diagnosis not present

## 2013-11-24 DIAGNOSIS — R042 Hemoptysis: Secondary | ICD-10-CM | POA: Diagnosis not present

## 2013-11-24 DIAGNOSIS — R5383 Other fatigue: Secondary | ICD-10-CM | POA: Diagnosis not present

## 2013-11-24 DIAGNOSIS — R5381 Other malaise: Secondary | ICD-10-CM | POA: Diagnosis not present

## 2013-11-24 DIAGNOSIS — J438 Other emphysema: Secondary | ICD-10-CM | POA: Diagnosis not present

## 2013-11-24 DIAGNOSIS — Z6826 Body mass index (BMI) 26.0-26.9, adult: Secondary | ICD-10-CM | POA: Diagnosis not present

## 2013-11-26 DIAGNOSIS — R9389 Abnormal findings on diagnostic imaging of other specified body structures: Secondary | ICD-10-CM | POA: Diagnosis not present

## 2013-11-26 DIAGNOSIS — I2510 Atherosclerotic heart disease of native coronary artery without angina pectoris: Secondary | ICD-10-CM | POA: Diagnosis not present

## 2013-11-26 DIAGNOSIS — R918 Other nonspecific abnormal finding of lung field: Secondary | ICD-10-CM | POA: Diagnosis not present

## 2013-11-26 DIAGNOSIS — J90 Pleural effusion, not elsewhere classified: Secondary | ICD-10-CM | POA: Diagnosis not present

## 2013-12-17 ENCOUNTER — Ambulatory Visit: Payer: Medicare Other | Admitting: *Deleted

## 2013-12-17 ENCOUNTER — Ambulatory Visit (INDEPENDENT_AMBULATORY_CARE_PROVIDER_SITE_OTHER): Payer: Medicare Other | Admitting: Pulmonary Disease

## 2013-12-17 ENCOUNTER — Encounter: Payer: Self-pay | Admitting: Pulmonary Disease

## 2013-12-17 VITALS — BP 144/70 | HR 60 | Ht 73.0 in | Wt 199.0 lb

## 2013-12-17 DIAGNOSIS — J84114 Acute interstitial pneumonitis: Secondary | ICD-10-CM | POA: Insufficient documentation

## 2013-12-17 DIAGNOSIS — J849 Interstitial pulmonary disease, unspecified: Secondary | ICD-10-CM

## 2013-12-17 DIAGNOSIS — J8410 Pulmonary fibrosis, unspecified: Secondary | ICD-10-CM

## 2013-12-17 HISTORY — DX: Interstitial pulmonary disease, unspecified: J84.9

## 2013-12-17 NOTE — Patient Instructions (Signed)
Blood work today Breathing test & CXR next visit Call if worse

## 2013-12-17 NOTE — Assessment & Plan Note (Signed)
Unclear cause of interstitial pneumonia at this time. 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 The pattern is not suggestive of  Idiopathic pulmonary fibrosis. The right lower lobe infiltrate does appear infectious, and his history of improvement with antibiotics is encouraging. Will obtain basic serology and PFTs He did not desaturate on exertion today.

## 2013-12-17 NOTE — Progress Notes (Signed)
Subjective:    Patient ID: Daniel Herrera, male    DOB: 1940/04/08, 73 y.o.   MRN: [id number]  HPI  Ref - [name] , [place], [facility]  73 year old never smoker presents for evaluation of abnormal CT imaging. He reports dyspnea and exertion for the past 6 months. He presented in 11/2013 with hemoptysis, reports less than 1 teaspoon of bright red blood on coughing that persisted for a few days. Chest x-ray suggested a nodular infiltrate in the left lung. CT chest without contrast showed interstitial prominence particularly apical with mild bronchiectasis, small patchy infiltrate was noted in the right lower lobe with a tiny effusion. He was treated with antibiotics and his dyspnea improved remarkably. He was given prednisone prior without relief. 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 He underwent evaluation many years ago in [place] and was told that he had scarring in his lungs. He is retired and does not report any other environmental exposures. No skin lesions or arthritis, hemoptysis has not recurred Past medical history-includes recommend C diff colitis and SVTs that he reports stop with vagal maneuvers.   Past Medical History  Diagnosis Date  . Thyroid disease   . Prostate enlargement   . GERD (gastroesophageal reflux disease)   . High cholesterol     Past Surgical History  Procedure Laterality Date  . Back surgery    . Nasal sinus surgery    . Appendectomy      Allergies  Allergen Reactions  . Benadryl [Diphenhydramine Hcl] Swelling  . Darvocet [Propoxyphene N-Acetaminophen]     hallucination  . Darvon [Propoxyphene Hcl]     Hallucination     History   Social History  . Marital Status: Married    Spouse Name: N/A    Number of Children: N/A  . Years of Education: N/A   Occupational History  . retired    Social History Main Topics  . Smoking status: Never Smoker     . Smokeless tobacco: Never Used  . Alcohol Use: No  . Drug Use: No  . Sexual Activity: Not on file   Other Topics Concern  . Not on file   Social History Narrative  . No narrative on file      Review of Systems  Constitutional: Negative for fever and unexpected weight change.  HENT: Negative for congestion, dental problem, ear pain, nosebleeds, postnasal drip, rhinorrhea, sinus pressure, sneezing, sore throat and trouble swallowing.   Eyes: Negative for redness and itching.  Respiratory: Positive for shortness of breath. Negative for cough, chest tightness and wheezing.   Cardiovascular: Negative for palpitations and leg swelling.  Gastrointestinal: Negative for nausea and vomiting.  Genitourinary: Negative for dysuria.  Musculoskeletal: Negative for joint swelling.  Skin: Negative for rash.  Neurological: Negative for headaches.  Hematological: Does not bruise/bleed easily.  Psychiatric/Behavioral: Negative for dysphoric mood. The patient is not nervous/anxious.        Objective:   Physical Exam  Gen. Pleasant, well-nourished, in no distress, normal affect ENT - no lesions, no post nasal drip Neck: No JVD, no thyromegaly, no carotid bruits Lungs: no use of accessory muscles, no dullness to percussion, clear without rales or rhonchi  Cardiovascular: Rhythm regular, heart sounds  normal, no murmurs or gallops, no peripheral edema Abdomen: soft and non-tender, no hepatosplenomegaly, BS normal. Musculoskeletal: No deformities, no cyanosis or clubbing Neuro:  alert, non focal  Assessment & Plan:

## 2013-12-18 LAB — COMPREHENSIVE METABOLIC PANEL
ALK PHOS: 70 U/L (ref 39–117)
ALT: 23 U/L (ref 0–53)
AST: 25 U/L (ref 0–37)
Albumin: 4.2 g/dL (ref 3.5–5.2)
BUN: 23 mg/dL (ref 6–23)
CALCIUM: 9.2 mg/dL (ref 8.4–10.5)
CHLORIDE: 108 meq/L (ref 96–112)
CO2: 27 mEq/L (ref 19–32)
CREATININE: 1.2 mg/dL (ref 0.4–1.5)
GFR: 62.07 mL/min (ref >60.00)
GLUCOSE: 93 mg/dL (ref 70–99)
Potassium: 4 mEq/L (ref 3.5–5.1)
Sodium: 142 mEq/L (ref 135–145)
Total Bilirubin: 1 mg/dL (ref 0.2–1.2)
Total Protein: 6.7 g/dL (ref 6.0–8.3)

## 2013-12-18 LAB — SEDIMENTATION RATE
SED RATE: 5 mm/h (ref 0–22)
Sed Rate: 6 mm/hr (ref 0–22)

## 2013-12-18 LAB — CBC WITH DIFFERENTIAL/PLATELET
BASOS PCT: 0.6 % (ref 0.0–3.0)
Basophils Absolute: 0 10*3/uL (ref 0.0–0.1)
EOS ABS: 0.1 10*3/uL (ref 0.0–0.7)
EOS PCT: 1.7 % (ref 0.0–5.0)
HCT: 43.8 % (ref 39.0–52.0)
HEMOGLOBIN: 14.8 g/dL (ref 13.0–17.0)
Lymphocytes Relative: 23 % (ref 12.0–46.0)
Lymphs Abs: 1.3 10*3/uL (ref 0.7–4.0)
MCHC: 33.7 g/dL (ref 30.0–36.0)
MCV: 94.1 fl (ref 78.0–100.0)
MONO ABS: 0.5 10*3/uL (ref 0.1–1.0)
Monocytes Relative: 8.2 % (ref 3.0–12.0)
NEUTROS ABS: 3.8 10*3/uL (ref 1.4–7.7)
Neutrophils Relative %: 66.5 % (ref 43.0–77.0)
Platelets: 181 10*3/uL (ref 150.0–400.0)
RBC: 4.66 Mil/uL (ref 4.22–5.81)
RDW: 13.4 % (ref 11.5–15.5)
WBC: 5.8 10*3/uL (ref 4.0–10.5)

## 2013-12-18 LAB — CYCLIC CITRUL PEPTIDE ANTIBODY, IGG: Cyclic Citrullin Peptide Ab: <2 U/mL (ref 0.0–5.0)

## 2013-12-18 NOTE — Progress Notes (Signed)
Quick Note:  Called spoke with patient, advised of lab results / recs as stated by RA. Pt verbalized his understanding and denied any questions. ______

## 2014-02-11 ENCOUNTER — Ambulatory Visit (INDEPENDENT_AMBULATORY_CARE_PROVIDER_SITE_OTHER)
Admission: RE | Admit: 2014-02-11 | Discharge: 2014-02-11 | Disposition: A | Discharge status: Home / Self Care | Payer: Medicare Other | Source: Ambulatory Visit | Attending: Pulmonary Disease | Admitting: Pulmonary Disease

## 2014-02-11 ENCOUNTER — Encounter: Payer: Self-pay | Admitting: Pulmonary Disease

## 2014-02-11 ENCOUNTER — Ambulatory Visit (INDEPENDENT_AMBULATORY_CARE_PROVIDER_SITE_OTHER): Payer: Medicare Other | Admitting: Pulmonary Disease

## 2014-02-11 ENCOUNTER — Other Ambulatory Visit: Payer: Self-pay | Admitting: Internal Medicine

## 2014-02-11 VITALS — BP 124/72 | HR 86 | Temp 97.1°F | Ht 72.0 in | Wt 198.0 lb

## 2014-02-11 DIAGNOSIS — J849 Interstitial pulmonary disease, unspecified: Secondary | ICD-10-CM

## 2014-02-11 DIAGNOSIS — J84114 Acute interstitial pneumonitis: Secondary | ICD-10-CM | POA: Diagnosis not present

## 2014-02-11 DIAGNOSIS — J449 Chronic obstructive pulmonary disease, unspecified: Secondary | ICD-10-CM | POA: Diagnosis not present

## 2014-02-11 LAB — PULMONARY FUNCTION TEST
DL/VA % pred: 78 %
DL/VA: 3.7 ml/min/mmHg/L
DLCO UNC % PRED: 65 %
DLCO unc: 22.99 ml/min/mmHg
FEF 25-75 POST: 3.73 L/s
FEF 25-75 Pre: 3.66 L/sec
FEF2575-%Change-Post: 1 %
FEF2575-%PRED-POST: 155 %
FEF2575-%PRED-PRE: 153 %
FEV1-%CHANGE-POST: 1 %
FEV1-%PRED-PRE: 105 %
FEV1-%Pred-Post: 107 %
FEV1-POST: 3.54 L
FEV1-PRE: 3.49 L
FEV1FVC-%CHANGE-POST: 4 %
FEV1FVC-%PRED-PRE: 110 %
FEV6-%CHANGE-POST: -2 %
FEV6-%PRED-POST: 99 %
FEV6-%Pred-Pre: 102 %
FEV6-Post: 4.24 L
FEV6-Pre: 4.37 L
FEV6FVC-%Pred-Post: 106 %
FEV6FVC-%Pred-Pre: 106 %
FVC-%Change-Post: -2 %
FVC-%PRED-POST: 93 %
FVC-%Pred-Pre: 96 %
FVC-POST: 4.24 L
FVC-Pre: 4.37 L
POST FEV1/FVC RATIO: 83 %
Post FEV6/FVC ratio: 100 %
Pre FEV1/FVC ratio: 80 %
Pre FEV6/FVC Ratio: 100 %
RV % PRED: 72 %
RV: 1.92 L
TLC % pred: 81 %
TLC: 6.08 L

## 2014-02-11 MED ORDER — PANTOPRAZOLE SODIUM 40 MG PO TBEC: 40.0000 mg | DELAYED_RELEASE_TABLET | Freq: Two times a day (BID) | ORAL | Status: DC

## 2014-02-11 NOTE — Progress Notes (Signed)
   Subjective:    Patient ID: Daniel Herrera, male    DOB: [birth year], 73 y.o.   MRN: [id number]  HPI  Ref - [name] , [place], [facility]  73 year old never smoker for FU of interstitial pneumonia He reports dyspnea onexertion x 6 months. He presented in 11/2013 with hemoptysis, reports less than 1 teaspoon of bright red blood on coughing that persisted for a few days. Chest x-ray suggested a nodular infiltrate in the left lung. CT chest without contrast showed interstitial prominence particularly apical with mild bronchiectasis, small patchy infiltrate was noted in the right lower lobe with a tiny effusion. He was treated with antibiotics and his dyspnea improved remarkably. He was given prednisone prior without relief. 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 He underwent evaluation many years ago in [place] and was told that he had scarring in his lungs. He is retired and does not report any other environmental exposures. No skin lesions or arthritis, hemoptysis has not recurred Past medical history-includes  C diff colitis and SVTs that he reports stop with vagal maneuvers.  ESR ,CCP neg PFTs 02/2014 - nml FEV1/ FVC & ratio, TLC 81%, DLCO 65% CXR -clear  C/o bad heartburn every night - not relieved by OTC prilosec  Review of Systems neg for any significant sore throat, dysphagia, itching, sneezing, nasal congestion or excess/ purulent secretions, fever, chills, sweats, unintended wt loss, pleuritic or exertional cp, hempoptysis, orthopnea pnd or change in chronic leg swelling. Also denies presyncope, palpitations, heartburn, abdominal pain, nausea, vomiting, diarrhea or change in bowel or urinary habits, dysuria,hematuria, rash, arthralgias, visual complaints, headache, numbness weakness or ataxia.     Objective:   Physical Exam  .exam3 Gen. Pleasant, well-nourished, in no distress ENT - no  lesions, no post nasal drip Neck: No JVD, no thyromegaly, no carotid bruits Lungs: no use of accessory muscles, no dullness to percussion, clear without rales or rhonchi  Cardiovascular: Rhythm regular, heart sounds  normal, no murmurs or gallops, no peripheral edema Musculoskeletal: No deformities, no cyanosis or clubbing        Assessment & Plan:

## 2014-02-11 NOTE — Patient Instructions (Signed)
Your lung function is near normal Rx for protonix 40 twice daily- take instead of prilosec x 6 wks  - until you see dr Lyndel Safe

## 2014-02-11 NOTE — Progress Notes (Signed)
PFT done today. 

## 2014-02-13 ENCOUNTER — Telehealth: Payer: Self-pay | Admitting: Pulmonary Disease

## 2014-02-13 NOTE — Telephone Encounter (Signed)
lmtcb

## 2014-02-14 NOTE — Assessment & Plan Note (Signed)
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

## 2014-02-16 NOTE — Telephone Encounter (Signed)
Called and spoke with pt and he stated that the insurance will not cover the pantoprazole.  He is requesting that RA call something else in.  RA please advise.   Thanks.    Allergies  Allergen Reactions  . Benadryl [Diphenhydramine Hcl] Swelling  . Darvocet [Propoxyphene N-Acetaminophen]     hallucination  . Darvon [Propoxyphene Hcl]     Hallucination     Current Outpatient Prescriptions on File Prior to Visit  Medication Sig Dispense Refill  . dicyclomine (BENTYL) 20 MG tablet Take 1 tablet (20 mg total) by mouth 2 (two) times daily. 20 tablet 0  . docusate sodium (COLACE) 100 MG capsule Take 100 mg by mouth daily.    [name] dutasteride (AVODART) 0.5 MG capsule Take 0.5 mg by mouth every evening.    . fish oil-omega-3 fatty acids 1000 MG capsule Take 1 g by mouth daily.    . fluticasone (VERAMYST) 27.5 MCG/SPRAY nasal spray Place 1 spray into the nose daily.    [name] levothyroxine (SYNTHROID, LEVOTHROID) 75 MCG tablet Take 75 mcg by mouth daily before breakfast.    . nabumetone (RELAFEN) 750 MG tablet Take 750 mg by mouth 2 (two) times daily.    [name] omeprazole (PRILOSEC) 20 MG capsule Take 20 mg by mouth daily.    . pantoprazole (PROTONIX) 40 MG tablet Take 1 tablet (40 mg total) by mouth 2 (two) times daily. 60 tablet 0  . Pitavastatin Calcium (LIVALO) 4 MG TABS Take 4 mg by mouth every evening.    . Probiotic Product (PROBIOTIC FORMULA PO) Take 1 capsule by mouth daily.     No current facility-administered medications on file prior to visit.

## 2014-02-16 NOTE — Telephone Encounter (Signed)
Called, spoke with pt -  Discussed below recs per Dr. Elsworth Soho.  Pt reports he is already taking omeprazole 20 mg bid, and it's not helping.  States he has heartburn "really bad qhs."  He would like to know if RA has additional recs.  Please advise.  Thank you.

## 2014-02-16 NOTE — Telephone Encounter (Signed)
Called spoke with pt. He reports his insurance does not cover generic protonix. He will call his GI doc for recs.

## 2014-02-16 NOTE — Telephone Encounter (Signed)
Can offer protonix  -this is generic Daniel Herrera should contact dr Lyndel Safe - his GI - office.

## 2014-02-16 NOTE — Telephone Encounter (Signed)
Omeprazole 20 bid

## 2014-04-03 DIAGNOSIS — I219 Acute myocardial infarction, unspecified: Secondary | ICD-10-CM

## 2014-04-03 DIAGNOSIS — I209 Angina pectoris, unspecified: Secondary | ICD-10-CM

## 2014-04-03 HISTORY — DX: Angina pectoris, unspecified: I20.9

## 2014-04-03 HISTORY — DX: Acute myocardial infarction, unspecified: I21.9

## 2014-04-16 DIAGNOSIS — R739 Hyperglycemia, unspecified: Secondary | ICD-10-CM | POA: Diagnosis not present

## 2014-04-16 DIAGNOSIS — M1990 Unspecified osteoarthritis, unspecified site: Secondary | ICD-10-CM | POA: Diagnosis not present

## 2014-04-16 DIAGNOSIS — E039 Hypothyroidism, unspecified: Secondary | ICD-10-CM | POA: Diagnosis not present

## 2014-04-16 DIAGNOSIS — R0600 Dyspnea, unspecified: Secondary | ICD-10-CM | POA: Diagnosis not present

## 2014-04-16 DIAGNOSIS — L959 Vasculitis limited to the skin, unspecified: Secondary | ICD-10-CM | POA: Diagnosis not present

## 2014-04-16 DIAGNOSIS — E785 Hyperlipidemia, unspecified: Secondary | ICD-10-CM | POA: Diagnosis not present

## 2014-04-16 DIAGNOSIS — Z125 Encounter for screening for malignant neoplasm of prostate: Secondary | ICD-10-CM | POA: Diagnosis not present

## 2014-04-16 DIAGNOSIS — K589 Irritable bowel syndrome without diarrhea: Secondary | ICD-10-CM | POA: Diagnosis not present

## 2014-04-16 DIAGNOSIS — B370 Candidal stomatitis: Secondary | ICD-10-CM | POA: Diagnosis not present

## 2014-04-16 DIAGNOSIS — Z6828 Body mass index (BMI) 28.0-28.9, adult: Secondary | ICD-10-CM | POA: Diagnosis not present

## 2014-05-06 DIAGNOSIS — D1801 Hemangioma of skin and subcutaneous tissue: Secondary | ICD-10-CM | POA: Diagnosis not present

## 2014-05-06 DIAGNOSIS — L570 Actinic keratosis: Secondary | ICD-10-CM | POA: Diagnosis not present

## 2014-05-14 DIAGNOSIS — R3915 Urgency of urination: Secondary | ICD-10-CM | POA: Diagnosis not present

## 2014-05-14 DIAGNOSIS — R972 Elevated prostate specific antigen [PSA]: Secondary | ICD-10-CM | POA: Diagnosis not present

## 2014-05-14 DIAGNOSIS — N401 Enlarged prostate with lower urinary tract symptoms: Secondary | ICD-10-CM | POA: Diagnosis not present

## 2014-05-14 DIAGNOSIS — R3912 Poor urinary stream: Secondary | ICD-10-CM | POA: Diagnosis not present

## 2014-05-15 DIAGNOSIS — H2513 Age-related nuclear cataract, bilateral: Secondary | ICD-10-CM | POA: Diagnosis not present

## 2014-05-15 DIAGNOSIS — H52203 Unspecified astigmatism, bilateral: Secondary | ICD-10-CM | POA: Diagnosis not present

## 2014-05-15 DIAGNOSIS — H5203 Hypermetropia, bilateral: Secondary | ICD-10-CM | POA: Diagnosis not present

## 2014-05-15 DIAGNOSIS — H524 Presbyopia: Secondary | ICD-10-CM | POA: Diagnosis not present

## 2014-06-09 DIAGNOSIS — H21561 Pupillary abnormality, right eye: Secondary | ICD-10-CM | POA: Diagnosis not present

## 2014-06-09 DIAGNOSIS — H2511 Age-related nuclear cataract, right eye: Secondary | ICD-10-CM | POA: Diagnosis not present

## 2014-06-09 DIAGNOSIS — H25011 Cortical age-related cataract, right eye: Secondary | ICD-10-CM | POA: Diagnosis not present

## 2014-06-09 DIAGNOSIS — H2181 Floppy iris syndrome: Secondary | ICD-10-CM | POA: Diagnosis not present

## 2014-06-09 DIAGNOSIS — H25811 Combined forms of age-related cataract, right eye: Secondary | ICD-10-CM | POA: Diagnosis not present

## 2014-06-16 DIAGNOSIS — H21562 Pupillary abnormality, left eye: Secondary | ICD-10-CM | POA: Diagnosis not present

## 2014-06-16 DIAGNOSIS — H25032 Anterior subcapsular polar age-related cataract, left eye: Secondary | ICD-10-CM | POA: Diagnosis not present

## 2014-06-16 DIAGNOSIS — H5703 Miosis: Secondary | ICD-10-CM | POA: Diagnosis not present

## 2014-06-16 DIAGNOSIS — H25012 Cortical age-related cataract, left eye: Secondary | ICD-10-CM | POA: Diagnosis not present

## 2014-06-16 DIAGNOSIS — H2512 Age-related nuclear cataract, left eye: Secondary | ICD-10-CM | POA: Diagnosis not present

## 2014-06-16 DIAGNOSIS — H25812 Combined forms of age-related cataract, left eye: Secondary | ICD-10-CM | POA: Diagnosis not present

## 2014-06-16 DIAGNOSIS — H2181 Floppy iris syndrome: Secondary | ICD-10-CM | POA: Diagnosis not present

## 2014-06-22 DIAGNOSIS — R972 Elevated prostate specific antigen [PSA]: Secondary | ICD-10-CM | POA: Diagnosis not present

## 2014-06-22 DIAGNOSIS — C61 Malignant neoplasm of prostate: Secondary | ICD-10-CM | POA: Diagnosis not present

## 2014-06-25 ENCOUNTER — Other Ambulatory Visit (HOSPITAL_COMMUNITY): Payer: Self-pay | Admitting: Urology

## 2014-06-25 DIAGNOSIS — C61 Malignant neoplasm of prostate: Secondary | ICD-10-CM

## 2014-07-13 ENCOUNTER — Encounter (HOSPITAL_COMMUNITY): Payer: Self-pay

## 2014-07-13 ENCOUNTER — Encounter (HOSPITAL_COMMUNITY)
Admission: RE | Admit: 2014-07-13 | Discharge: 2014-07-13 | Disposition: A | Discharge status: Home / Self Care | Payer: Medicare Other | Source: Ambulatory Visit | Attending: Urology | Admitting: Urology

## 2014-07-13 DIAGNOSIS — C61 Malignant neoplasm of prostate: Secondary | ICD-10-CM | POA: Diagnosis present

## 2014-07-13 MED ORDER — TECHNETIUM TC 99M MEDRONATE IV KIT
27.2000 | PACK | Freq: Once | INTRAVENOUS | Status: AC | PRN
  Administered 2014-07-13: 27.2 via INTRAVENOUS

## 2014-07-20 DIAGNOSIS — R972 Elevated prostate specific antigen [PSA]: Secondary | ICD-10-CM | POA: Diagnosis not present

## 2014-07-20 DIAGNOSIS — C61 Malignant neoplasm of prostate: Secondary | ICD-10-CM | POA: Diagnosis not present

## 2014-07-20 DIAGNOSIS — N210 Calculus in bladder: Secondary | ICD-10-CM | POA: Diagnosis not present

## 2014-07-20 DIAGNOSIS — K5730 Diverticulosis of large intestine without perforation or abscess without bleeding: Secondary | ICD-10-CM | POA: Diagnosis not present

## 2014-07-20 DIAGNOSIS — K4020 Bilateral inguinal hernia, without obstruction or gangrene, not specified as recurrent: Secondary | ICD-10-CM | POA: Diagnosis not present

## 2014-07-21 DIAGNOSIS — C61 Malignant neoplasm of prostate: Secondary | ICD-10-CM | POA: Diagnosis not present

## 2014-07-23 DIAGNOSIS — M1990 Unspecified osteoarthritis, unspecified site: Secondary | ICD-10-CM | POA: Diagnosis not present

## 2014-07-23 DIAGNOSIS — N400 Enlarged prostate without lower urinary tract symptoms: Secondary | ICD-10-CM | POA: Diagnosis not present

## 2014-07-23 DIAGNOSIS — Z6827 Body mass index (BMI) 27.0-27.9, adult: Secondary | ICD-10-CM | POA: Diagnosis not present

## 2014-07-23 DIAGNOSIS — I10 Essential (primary) hypertension: Secondary | ICD-10-CM | POA: Diagnosis not present

## 2014-08-03 ENCOUNTER — Telehealth: Payer: Self-pay | Admitting: Medical Oncology

## 2014-08-03 NOTE — Telephone Encounter (Signed)
I called pt to introduce myself as the Prostate Nurse Navigator and the Coordinator of the Prostate [place].  1. I confirmed with the patient he is aware of his referral to the clinic 5/13 arriving at 7:30am.  2. I discussed the format of the clinic and the physicians he will be seeing that day.  3. I discussed where the clinic is located and how to contact me.  4. I confirmed his address and informed him I would be mailing a packet of information and forms to be completed. I asked him to bring them with him the day of his appointment.   He voiced understanding of the above. I asked him to call me if he has any questions or concerns regarding his appointments or the forms he needs to complete.

## 2014-08-07 DIAGNOSIS — C61 Malignant neoplasm of prostate: Secondary | ICD-10-CM | POA: Insufficient documentation

## 2014-08-07 HISTORY — DX: Malignant neoplasm of prostate: C61

## 2014-08-07 NOTE — Progress Notes (Signed)
Radiation Oncology         [phone] ________________________________  Multidisciplinary Prostate Cancer Clinic  Initial Radiation Oncology Consultation  Name: Daniel Herrera MRN: [id number]  Date: 08/14/2014  DOB: Apr 10, 1940  FO:YDXA,JOI, NP  Raynelle Bring, MD   REFERRING PHYSICIAN: Raynelle Bring, MD  DIAGNOSIS: 74 y.o. gentleman with stage T1c adenocarcinoma of the prostate with a Gleason's score of 4+4 and a PSA of 8.8 (on finasteride)    ICD-9-CM ICD-10-CM   1. Malignant neoplasm of prostate [place] OF PRESENT ILLNESS::Daniel Herrera is a 74 y.o. gentleman.  He was noted to have an elevated PSA of 7.2 in January by his primary care physician.  Accordingly, he was referred for evaluation in urology by Dr. Janice Norrie and  digital rectal examination was performed at that time revealing no nodules.  The patient proceeded to transrectal ultrasound with 12 biopsies of the prostate on 07/31/14.  The prostate volume measured 22.18 cc.  Out of 12 core biopsies,11 were positive.  The maximum Gleason score was 4+4, and this was seen in the distribution shown below.    The patient reviewed the biopsy results with his urologist and he has kindly been referred today to the multidisciplinary prostate cancer clinic for presentation of pathology and radiology studies in our conference for discussion of potential radiation treatment options and clinical evaluation.  PREVIOUS RADIATION THERAPY: No  PAST MEDICAL HISTORY:  has a past medical history of Thyroid disease; Prostate enlargement; GERD (gastroesophageal reflux disease); and High cholesterol.    PAST SURGICAL HISTORY: Past Surgical History  Procedure Laterality Date  . Back surgery    . Nasal sinus surgery    . Appendectomy      FAMILY HISTORY: family history is not on file.  SOCIAL HISTORY:  reports that he has never smoked. He has never used smokeless tobacco. He reports that he does not drink alcohol or use illicit  drugs.  ALLERGIES: Benadryl; Darvocet; and Darvon  MEDICATIONS:  Current Outpatient Prescriptions  Medication Sig Dispense Refill  . dicyclomine (BENTYL) 20 MG tablet Take 1 tablet (20 mg total) by mouth 2 (two) times daily. 20 tablet 0  . docusate sodium (COLACE) 100 MG capsule Take 100 mg by mouth daily.    [name] dutasteride (AVODART) 0.5 MG capsule Take 0.5 mg by mouth every evening.    . fish oil-omega-3 fatty acids 1000 MG capsule Take 1 g by mouth daily.    . fluticasone (VERAMYST) 27.5 MCG/SPRAY nasal spray Place 1 spray into the nose daily.    [name] levothyroxine (SYNTHROID, LEVOTHROID) 75 MCG tablet Take 75 mcg by mouth daily before breakfast.    . nabumetone (RELAFEN) 750 MG tablet Take 750 mg by mouth 2 (two) times daily.    [name] omeprazole (PRILOSEC) 20 MG capsule Take 20 mg by mouth daily.    . Pitavastatin Calcium (LIVALO) 4 MG TABS Take 4 mg by mouth every evening.    . Probiotic Product (PROBIOTIC FORMULA PO) Take 1 capsule by mouth daily.     No current facility-administered medications for this encounter.    REVIEW OF SYSTEMS:  A 15 point review of systems is documented in the electronic medical record. This was obtained by the nursing staff. However, I reviewed this with the patient to discuss relevant findings and make appropriate changes.  A comprehensive review of systems was negative..  The patient completed an IPSS and IIEF questionnaire.  [name]   PHYSICAL EXAM: This patient  is in no acute distress.  He is alert and oriented.   He exhibits no respiratory distress or labored breathing.  He appears neurologically intact.  His mood is pleasant.  His affect is appropriate.  Please note the digital rectal exam findings described above.  KPS = 100  100 - Normal; no complaints; no evidence of disease. 90   - Able to carry on normal activity; minor signs or symptoms of disease. 80   - Normal activity with effort; some signs or symptoms of disease. 58   - Cares for self; unable to carry  on normal activity or to do active work. 60   - Requires occasional assistance, but is able to care for most of his personal needs. 50   - Requires considerable assistance and frequent medical care. 20   - Disabled; requires special care and assistance. 48   - Severely disabled; hospital admission is indicated although death not imminent. 85   - Very sick; hospital admission necessary; active supportive treatment necessary. 10   - Moribund; fatal processes progressing rapidly. 0     - Dead  Karnofsky DA, Abelmann [facility], Craver LS and Burchenal [facility] [phone]) The use of the nitrogen mustards in the palliative treatment of carcinoma: with particular reference to bronchogenic carcinoma Cancer 1 [id number]   LABORATORY DATA:  Lab Results  Component Value Date   WBC 5.8 12/17/2013   HGB 14.8 12/17/2013   HCT 43.8 12/17/2013   MCV 94.1 12/17/2013   PLT 181.0 12/17/2013   Lab Results  Component Value Date   NA 142 12/17/2013   K 4.0 12/17/2013   CL 108 12/17/2013   CO2 27 12/17/2013   Lab Results  Component Value Date   ALT 23 12/17/2013   AST 25 12/17/2013   ALKPHOS 70 12/17/2013   BILITOT 1.0 12/17/2013     RADIOGRAPHY: Nm Bone Scan Whole Body  07/13/2014   CLINICAL DATA:  Prostate cancer.  Staging.  EXAM: NUCLEAR MEDICINE WHOLE BODY BONE SCAN  TECHNIQUE: Whole body anterior and posterior images were obtained approximately 3 hours after intravenous injection of radiopharmaceutical.  RADIOPHARMACEUTICALS:  27.2 mCi Technetium-99 MDP  COMPARISON:  None.  FINDINGS: Ordinary minimal degenerative changes at the knees and shoulders. No skeletal activity to suggest osseous metastatic disease.  IMPRESSION: Negative for bone scan evidence of osseous metastatic disease.   Electronically Signed   By: Nelson Chimes M.D.   On: 07/13/2014 13:00      IMPRESSION: This gentleman is a 74 y.o. gentleman with stage T1c adenocarcinoma of the prostate with a Gleason's score of 4+4 and a PSA of 8.8 (on finasteride).   His T-Stage, Gleason's Score, and PSA put him into the high risk group.  Accordingly he is eligible for a variety of potential treatment options including prostatectomy and radiotherapy with androgen deprivation.  PLAN: Today I reviewed the findings and workup thus far.  We discussed the natural history of prostate cancer.  We reviewed the the implications of T-stage, Gleason's Score, and PSA on decision-making and outcomes in prostate cancer.  We discussed radiation treatment in the management of prostate cancer with regard to the logistics and delivery of external beam radiation treatment as well as the logistics and delivery of prostate brachytherapy.  We compared and contrasted each of these approaches and also compared these against prostatectomy.  The patient expressed interest in external beam radiotherapy.  I filled out a patient counseling form for him with relevant treatment diagrams and we retained a  copy for our records.   The patient is leaning toward prostate IMRT with 2 years androgen deprivation.  I will share my findings with Dr. Alinda Money.  He may prefer radiation in [place] with Dr. Orlene Erm.     I enjoyed meeting with him today, and will look forward to participating in the care of this very nice gentleman.  I spent 40 minutes face to face with the patient and more than 50% of that time was spent in counseling and/or coordination of care.   This document serves as a record of services personally performed by [name], MD. It was created on his behalf by Jeralene Peters, a trained medical scribe. The creation of this record is based on the scribe's personal observations and the provider's statements to them. This document has been checked and approved by the attending provider.      ------------------------------------------------  [name] [name], M.D.

## 2014-08-11 ENCOUNTER — Encounter: Payer: Self-pay | Admitting: Radiation Oncology

## 2014-08-11 NOTE — Progress Notes (Signed)
GU Location of Tumor / Histology: prostatic adenocarcinoma  If Prostate Cancer, Gleason Score is (4 + 4) and PSA is (8.80)  Daniel Herrera presented in 2008 with obstructive urinary symptoms.  Biopsies of prostate (if applicable) revealed:    Past/Anticipated interventions by urology, if any: prostate biopsy and referral to [facility]  Past/Anticipated interventions by medical oncology, if any: no  Weight changes, if any: no  Bowel/Bladder complaints, if any: urgency, frequency, nocturia x1, erectile dysfunction, urine stream stops and starts   Nausea/Vomiting, if any: no  Pain issues, if any:  no  SAFETY ISSUES:  Prior radiation? no  Pacemaker/ICD? no  Possible current pregnancy? no  Is the patient on methotrexate? no  Current Complaints / other details:  74 year old male. Married. Retired. Prostate volume 22.18 cc.

## 2014-08-12 ENCOUNTER — Ambulatory Visit (INDEPENDENT_AMBULATORY_CARE_PROVIDER_SITE_OTHER)
Admission: RE | Admit: 2014-08-12 | Discharge: 2014-08-12 | Disposition: A | Discharge status: Home / Self Care | Payer: Medicare Other | Source: Ambulatory Visit | Attending: Adult Health | Admitting: Adult Health

## 2014-08-12 ENCOUNTER — Encounter: Payer: Self-pay | Admitting: Adult Health

## 2014-08-12 ENCOUNTER — Ambulatory Visit (INDEPENDENT_AMBULATORY_CARE_PROVIDER_SITE_OTHER): Payer: Medicare Other | Admitting: Adult Health

## 2014-08-12 VITALS — BP 118/74 | HR 62 | Temp 97.7°F | Ht 73.0 in | Wt 203.2 lb

## 2014-08-12 DIAGNOSIS — J849 Interstitial pulmonary disease, unspecified: Secondary | ICD-10-CM

## 2014-08-12 DIAGNOSIS — J449 Chronic obstructive pulmonary disease, unspecified: Secondary | ICD-10-CM | POA: Diagnosis not present

## 2014-08-12 DIAGNOSIS — J84114 Acute interstitial pneumonitis: Secondary | ICD-10-CM | POA: Diagnosis not present

## 2014-08-12 NOTE — Patient Instructions (Signed)
May help to put fish oil in freezer.  Chest xray today . Follow up Dr. Elsworth Soho  In 1 year and As needed   Best of luck with Urology this week.

## 2014-08-12 NOTE — Progress Notes (Signed)
   Subjective:    Patient ID: Daniel Herrera, male    DOB: 1940-11-07, 74 y.o.   MRN: [id number]  HPI  Ref - [name] , [place], [facility]  74 year old never smoker for FU of interstitial pneumonia He reports dyspnea onexertion x 6 months. He presented in 11/2013 with hemoptysis, reports less than 1 teaspoon of bright red blood on coughing that persisted for a few days. Chest x-ray suggested a nodular infiltrate in the left lung. CT chest without contrast showed interstitial prominence particularly apical with mild bronchiectasis, small patchy infiltrate was noted in the right lower lobe with a tiny effusion. He was treated with antibiotics and his dyspnea improved remarkably. He was given prednisone prior without relief. 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 He underwent evaluation many years ago in [place] and was told that he had scarring in his lungs. He is retired and does not report any other environmental exposures. No skin lesions or arthritis, hemoptysis has not recurred Past medical history-includes  C diff colitis and SVTs that he reports stop with vagal maneuvers.  ESR ,CCP neg PFTs 02/2014 - nml FEV1/ FVC & ratio, TLC 81%, DLCO 65% CXR -clear  C/o bad heartburn every night - not relieved by OTC prilosec  08/12/2014 Follow up : Interstitial PNA  Pt returns for 6 month follow up  Says he is doing very well. Denies dyspnea.  Is very active on farm .  No longer working with chickens.  Spirometry today reviewed and shows nml lung function .  CXR today is clear .  Denies chest pain, orthopnea, edema or fever. Wt loss or hemoptysis .   Recently dx with prostate cancer , bone scan neg for mets.  Going to urology this week to discuss treatment options.    From [facility] .   Review of Systems neg for any significant sore throat, dysphagia, itching, sneezing, nasal congestion or excess/  purulent secretions, fever, chills, sweats, unintended wt loss, pleuritic or exertional cp, hempoptysis, orthopnea pnd or change in chronic leg swelling. Also denies presyncope, palpitations, heartburn, abdominal pain, nausea, vomiting, diarrhea or change in bowel or urinary habits, dysuria,hematuria, rash, arthralgias, visual complaints, headache, numbness weakness or ataxia.     Objective:   Physical Exam    Gen. Pleasant, well-nourished, in no distress, elderly  ENT - no lesions, no post nasal drip Neck: No JVD, no thyromegaly, no carotid bruits Lungs: no use of accessory muscles, no dullness to percussion, clear without rales or rhonchi  Cardiovascular: Rhythm regular, heart sounds  normal, no murmurs or gallops, no peripheral edema Musculoskeletal: No deformities, no cyanosis or clubbing   CXR 08/12/14  Clear  Reviewed independently.       Assessment & Plan:

## 2014-08-13 ENCOUNTER — Telehealth: Payer: Self-pay | Admitting: Medical Oncology

## 2014-08-13 NOTE — Assessment & Plan Note (Signed)
Remains resolved ? 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  Lung function on spirometry remains  normal  Use caution with fish oil .   Plan  Cont on current regimen  follow up Dr. Elsworth Soho  In 1 year .

## 2014-08-13 NOTE — Telephone Encounter (Signed)
I called Mr. Daniel Herrera to confirm his appointment for Prostate [facility] 5/13 arriving at 7:30am. He states he will be here and I reminding him to bring his completed paper work. He voiced understanding.    Cira Rue, RN, BSN, [place]  [phone] Fax [phone]

## 2014-08-14 ENCOUNTER — Ambulatory Visit (HOSPITAL_BASED_OUTPATIENT_CLINIC_OR_DEPARTMENT_OTHER): Payer: Medicare Other | Admitting: Oncology

## 2014-08-14 ENCOUNTER — Ambulatory Visit
Admission: RE | Admit: 2014-08-14 | Discharge: 2014-08-14 | Disposition: A | Discharge status: Home / Self Care | Payer: Medicare Other | Source: Ambulatory Visit | Attending: Radiation Oncology | Admitting: Radiation Oncology

## 2014-08-14 ENCOUNTER — Encounter: Payer: Self-pay | Admitting: Medical Oncology

## 2014-08-14 ENCOUNTER — Encounter: Payer: Self-pay | Admitting: *Deleted

## 2014-08-14 ENCOUNTER — Encounter: Payer: Self-pay | Admitting: Radiation Oncology

## 2014-08-14 VITALS — BP 168/92 | HR 77 | Temp 97.9°F | Resp 16 | Ht 73.0 in | Wt 203.6 lb

## 2014-08-14 DIAGNOSIS — R351 Nocturia: Secondary | ICD-10-CM | POA: Diagnosis not present

## 2014-08-14 DIAGNOSIS — Z8042 Family history of malignant neoplasm of prostate: Secondary | ICD-10-CM | POA: Diagnosis not present

## 2014-08-14 DIAGNOSIS — C61 Malignant neoplasm of prostate: Secondary | ICD-10-CM | POA: Insufficient documentation

## 2014-08-14 DIAGNOSIS — Z809 Family history of malignant neoplasm, unspecified: Secondary | ICD-10-CM | POA: Diagnosis not present

## 2014-08-14 HISTORY — DX: Personal history of other infectious and parasitic diseases: Z86.19

## 2014-08-14 HISTORY — DX: Unspecified malignant neoplasm of skin, unspecified: C44.90

## 2014-08-14 HISTORY — DX: Other specified postprocedural states: Z98.890

## 2014-08-14 HISTORY — DX: Personal history of other diseases of the digestive system: Z87.19

## 2014-08-14 NOTE — Progress Notes (Signed)
Reason for Referral: Prostate cancer.   HPI: 74 year old gentleman currently of [place] where he lived the majority of his life. He was found to have an elevated PSA of 7.2 in January 2016 after was elevated from 2.5 in 2013. He was evaluated by Dr. Izora Gala and underwent a biopsy on 06/25/2014 which showed high volume disease including a Gleason score 4+3 = 7 the majority of cores with 2 cores of Gleason score 4+4 = 8. He had 11 out of 12 cores involved with cancer. His PSA as mentioned was 8.8 but he was on Avodart was corrected PSA close to 14. His staging workup including a bone scan and CT scan did not show any evidence of metastatic disease. He is referred today to the prostate cancer multidisciplinary clinic to discuss options. Clinically he is asymptomatic other than slight nocturia and urgency. He does not report any hematuria or dysuria. Does not report any headaches or blurry vision. Does not report any fevers or chills or sweats. Does not report any chest pain or palpitation. Does not report any nausea or vomiting or abdominal pain. Has not reported any hematochezia or melena. Does not report any skeletal complaints. 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 The remaining review of systems unremarkable.   Past Medical History  Diagnosis Date  . Thyroid disease   . Prostate enlargement   . GERD (gastroesophageal reflux disease)   . High cholesterol   . Prostate cancer   . Anxiety   . Arthritis   . History of prolonged Q-T interval on ECG   . Personal history of digestive disease     gastric ulcer  . Gout   :  Past Surgical History  Procedure Laterality Date  . Back surgery    . Nasal sinus surgery    . Appendectomy    . Prostate biopsy    :   Current outpatient prescriptions:  .  dutasteride (AVODART) 0.5 MG capsule, Take 0.5 mg by mouth every evening., Disp: , Rfl:  .  fish oil-omega-3 fatty acids 1000 MG capsule,  Take 1 g by mouth daily., Disp: , Rfl:  .  fluticasone (VERAMYST) 27.5 MCG/SPRAY nasal spray, Place 1 spray into the nose daily., Disp: , Rfl:  .  levothyroxine (SYNTHROID, LEVOTHROID) 75 MCG tablet, Take 75 mcg by mouth daily before breakfast., Disp: , Rfl:  .  nabumetone (RELAFEN) 750 MG tablet, Take 750 mg by mouth 2 (two) times daily., Disp: , Rfl:  .  omeprazole (PRILOSEC) 20 MG capsule, Take 20 mg by mouth daily., Disp: , Rfl: :  Allergies  Allergen Reactions  . Benadryl [Diphenhydramine Hcl] Swelling  . Darvocet [Propoxyphene N-Acetaminophen]     hallucination  . Darvon [Propoxyphene Hcl]     Hallucination   :  Family History  Problem Relation Age of Onset  . Cancer Mother   . Cancer Father     prostate  :  History   Social History  . Marital Status: Married    Spouse Name: N/A  . Number of Children: N/A  . Years of Education: N/A   Occupational History  . retired    Social History Main Topics  . Smoking status: Never Smoker   . Smokeless tobacco: Never Used  . Alcohol Use: No  . Drug Use: No  . Sexual Activity: Not on file   Other Topics Concern  . Not on file   Social History Narrative  :  Pertinent items are noted in HPI.  Exam: ECOG 0 There were no vitals taken for this visit. General appearance: alert and cooperative Head: Normocephalic, without obvious abnormality Throat: lips, mucosa, and tongue normal; teeth and gums normal Neck: no adenopathy Back: negative Resp: clear to auscultation bilaterally Chest wall: no tenderness Cardio: regular rate and rhythm, S1, S2 normal, no murmur, click, rub or gallop GI: soft, non-tender; bowel sounds normal; no masses,  no organomegaly Extremities: extremities normal, atraumatic, no cyanosis or edema Pulses: 2+ and symmetric    Dg Chest 2 View  08/12/2014   CLINICAL DATA:  Interstitial lung disease.  EXAM: CHEST  2 VIEW  COMPARISON:  02/11/2014  FINDINGS: There is biapical fibrosis. The lungs are  hyperinflated likely secondary to COPD. There is no focal parenchymal opacity. There is no pleural effusion or pneumothorax. The heart and mediastinal contours are unremarkable.  The osseous structures are unremarkable.  IMPRESSION: No active cardiopulmonary disease.   Electronically Signed   By: Kathreen Devoid   On: 08/12/2014 13:03   EXAM: NUCLEAR MEDICINE WHOLE BODY BONE SCAN  TECHNIQUE: Whole body anterior and posterior images were obtained approximately 3 hours after intravenous injection of radiopharmaceutical.  RADIOPHARMACEUTICALS: 27.2 mCi Technetium-99 MDP  COMPARISON: None.  FINDINGS: Ordinary minimal degenerative changes at the knees and shoulders. No skeletal activity to suggest osseous metastatic disease.  IMPRESSION: Negative for bone scan evidence of osseous metastatic disease.   Assessment and Plan:   74 year old gentleman diagnosed with prostate cancer after presenting with a PSA of 8.8 and a Gleason score 4+4 = 8. He has high volume disease with 11 out of 12 cores involved of malignancy. Options treatment today were discussed as a part of the prostate cancer multidisciplinary clinic. His imaging studies as well as pathology slides were reviewed. Given his high-risk disease he would be a good candidate for radiation therapy with androgen deprivation. The surgery might leave him with long-term incontinence given the high-volume disease that he has. The logistics of hormonal therapy was discussed today including the complications. These would include hot flashes, weight gain, osteoporosis and potentially cardiovascular complications. He will hear from Dr. Alinda Money as well as Dr. [name] regarding radiation therapy and surgery as well. But  radiation therapy concomitantly with hormone therapy close to years obesity ideal option. I see no role for systemic chemotherapy at this time. He understands he has high risk disease and it could've developed metastatic disease and  certainly will require more systemic therapy at that time.

## 2014-08-14 NOTE — Progress Notes (Signed)
                               Care Plan Summary  Name: Daniel Herrera DOB: [birth year]    Your Medical Team:   Urologist -  Dr. Raynelle Bring, [facility]  Radiation Oncologist - Dr.Matthew Micki [facility]   Medical Oncologist - Dr. Zola Button, [place]  Recommendations: 1) Androgen Deprivation Therapy (hormone therapy) 2) Radiation 3) Surgery  * These recommendations are based on information available as of today's consult.      Recommendations may change depending on the results of further tests or exams.  Next Steps: 1) Will consider his options and contact me with decision.     When appointments need to be scheduled, you will be contacted by [facility] and/or [facility].  Questions?  Please do not hesitate to call Cira Rue, RN, BSN, CRNI at [phone] any questions or concerns.  Shirlean Mylar is your Oncology Nurse Navigator and is available to assist you while you're receiving your medical care at [facility].

## 2014-08-14 NOTE — Addendum Note (Signed)
Encounter addended by: Raynelle Bring, MD on: 08/14/2014  5:46 PM<BR>     Documentation filed: Clinical Notes

## 2014-08-14 NOTE — Addendum Note (Signed)
Encounter addended by: Heywood Footman, RN on: 08/14/2014  4:21 PM<BR>     Documentation filed: Chief Complaint Section, Vitals Section, Demographics Visit, Notes Section, Charges VN, Medications, Flowsheet VN

## 2014-08-14 NOTE — Progress Notes (Signed)
Retired. Married 56 years to [employer]. Three children, Renita, Katharine Look, and Remo Lipps. Reports heartburn. Reports right hand pain related to arthritis.

## 2014-08-14 NOTE — Consult Note (Signed)
Chief Complaint  Prostate Cancer   Reason For Visit  Reason for consult: To discuss treatment options for prostate cancer. Physician requesting consult: Dr. Lowella Bandy PCP: [name], NP Location of consult: [facility]   History of Present Illness      Mr. Schembri is a 74 year old gentleman with a history of atrial fibrillation, hypothyroidism, gastric ulcers, gout, hypercholesterolemia, and BPH treated with Avodart for over 15 years.  He is not anticoagulated and it appears that his atrial fibrillation and been transient during the hospitalization.  He does not have ongoing cardiology follow-up on a regular basis.  He was noted to have a rise in his PSA to 8.8 while on Avodart prompting urologic evaluation and a prostate needle biopsy by Dr. Janice Norrie on 06/22/14 that confirmed Gleason 4+4=8 adenocarcinoma with 11 out of 12 biopsy cores positive for malignancy with evidence of perineural invasion in multiple cores and extraprostatic extension at the right lateral base core.  His DRE was normal.  He had initially informed Dr. Janice Norrie that he had a paternal family history of prostate cancer.  However, more recent discussion with his sister has indicated that his father did not actually have prostate cancer.  His father died at age 33 of unrelated causes and his mother lived to be 28.  His staging studies include a bone scan (07/13/14) and CT scan (07/20/14) both without evidence of metastatic disease.   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  TNM stage: cT3a N0 M0 PSA: 8.8 (on 5 ARI) Gleason score: 4+4=8 Biopsy (06/22/14): 11/12 cores positive    Left: L lateral apex (40%, 3+4=7), L apex (30%, 4+3=7), L lateral mid (40%, 4+3=7), L mid (20%, 4+3=7), L lateral base (20%, 3+4=7, PNI) L base (10%, 4+3=7)    Right: R lateral apex (10%, 3+3=6), R mid (40%, 4+4=8), R lateral mid (70%, 4+3=7, PNI), R base (70%, 4+4=8, PNI), R lateral base (  60%, 4+3=7, PNI, EPE) Prostate volume: 22.2 cc  Urinary function: He has minimal lower urinary tract symptoms.  IPSS is 1. Erectile function: This is a low priority.  SHIM score is 5.   Past Medical History  1. History of Anxiety (F41.9)  2. History of Arthritis  3. History of atrial fibrillation (Z86.79)  4. History of gastric ulcer (Z87.19)  5. History of gout (Z87.39)  6. History of heartburn (Z87.898)  7. History of hypercholesterolemia (Z86.39)  8. History of Primary hypothyroidism (E03.9)    He has a history of basal cell skin cancers. His atrial fibrillation apparently was transient and he does not have ongoing follow-up care.   Surgical History  1. History of Back Surgery  2. History of Hernia Repair  3. History of Shoulder Surgery  4. History of Sinus Surgery  5. History of Surgery Of Male Genitalia Vasectomy  6. History of Varicose Vein Ligation   He has undergone an umbilical hernia repair.  He is unsure whether this was performed with mesh.   Current Meds  1. Avodart 0.5 MG Oral Capsule;  Therapy: (Recorded:11Feb2016) to Recorded  2. Fish Oil CAPS;  Therapy: (Recorded:11Feb2016) to Recorded  3. Levothyroxine Sodium 75 MCG Oral Tablet;  Therapy: (Recorded:11Feb2016) to Recorded  4. Livalo 4 MG Oral Tablet;  Therapy: (Recorded:11Feb2016) to Recorded  5. Nabumetone 750 MG Oral Tablet;  Therapy: (Recorded:11Feb2016) to Recorded  6. Omeprazole 20 MG Oral Capsule Delayed Release;  Therapy: (Recorded:11Feb2016) to Recorded  7. Veramyst  27.5 MCG/SPRAY Nasal Suspension;  Therapy: (Recorded:11Feb2016) to Recorded  Allergies  1. Benadryl Allergy CHEW  2. Darvon  Family History  1. Family history of Cancer : Mother  2. Family history of Heart Disease : Mother  Social History   Alcohol use (Z78.9)   Marital History - Currently Married   Married   Never a smoker   Retired  Review of Systems AU Complete-Male: Genitourinary, constitutional, skin, eye,  otolaryngeal, hematologic/lymphatic, cardiovascular, pulmonary, endocrine, musculoskeletal, gastrointestinal, neurological and psychiatric system(s) were reviewed and pertinent findings if present are noted and are otherwise negative.  Constitutional: no fever and no night sweats.  Hematologic/Lymphatic: no swollen glands.  Cardiovascular: no chest pain and no leg swelling.  Respiratory: no shortness of breath and no cough.    Physical Exam Constitutional: Well nourished and well developed . No acute distress.  ENT:. The ears and nose are normal in appearance.  Neck: The appearance of the neck is normal and no neck mass is present.  Pulmonary: No respiratory distress, normal respiratory rhythm and effort and clear bilateral breath sounds.  Cardiovascular: Heart rate and rhythm are normal . No peripheral edema.  Abdomen: The abdomen is soft and nontender. No masses are palpated. No CVA tenderness. No hernias are palpable. No hepatosplenomegaly noted.  Rectal: Rectal exam demonstrates normal sphincter tone, no tenderness and no masses. Prostate size is estimated to be 40 g. He does have significant nodularity of the right side of the prostate particular toward the base. Although there is no clear extraprostatic extension, this is still highly concerning for the possibility of extraprostatic disease. The prostate is not tender. The left seminal vesicle is nonpalpable. The right seminal vesicle is nonpalpable. The perineum is normal on inspection.  Lymphatics: The femoral and inguinal nodes are not enlarged or tender.  Skin: Normal skin turgor, no visible rash and no visible skin lesions.  Neuro/Psych:. Mood and affect are appropriate.    Results/Data  I have independently reviewed his medical records, PSA results, and pathology report.  We reviewed his pathology slides and his imaging studies including his bone scan and CT scan today in the multidisciplinary conference.     Assessment  1.  Adenocarcinoma of prostate (C61)  Discussion/Summary  1.  Locally advanced prostate cancer: I had a detailed discussion with Mr. Connors and his wife today regarding his prostate cancer and treatment options.  He has no definite evidence for metastatic disease and I did recommend therapy of curative intent.   The patient was counseled about the natural history of prostate cancer and the standard treatment options that are available for prostate cancer. It was explained to him how his age and life expectancy, clinical stage, Gleason score, and PSA affect his prognosis, the decision to proceed with additional staging studies, as well as how that information influences recommended treatment strategies. We discussed the roles for active surveillance, radiation therapy, surgical therapy, androgen deprivation, as well as ablative therapy options for the treatment of prostate cancer as appropriate to his individual cancer situation. We discussed the risks and benefits of these options with regard to their impact on cancer control and also in terms of potential adverse events, complications, and impact on quiality of life particularly related to urinary, bowel, and sexual function. The patient was encouraged to ask questions throughout the discussion today and all questions were answered to his stated satisfaction. In addition, the patient was provided with and/or directed to appropriate resources and literature for further education about prostate cancer  and treatment options.   He initially had been leaning toward proceeding with surgical therapy based on prior discussions and reading.  We discussed his options which included primary surgical therapy with a high likelihood of requiring adjuvant radiation/androgen deprivation versus primary radiotherapy with concomitant long-term androgen deprivation therapy.  We went into the potential pros and cons of each of these approaches in detail.  He understands that  surgical therapy would carry an increased risk of incontinence especially at his advanced age a potentially allow him to avoid androgen deprivation therapy.  He understands that he likely would require radiation therapy after surgery.  He understands that radiation therapy would be optimized with concomitant androgen deprivation therapy and we reviewed the potential side effects of this therapy.  I informed him that either approach would offer an equivalent chance at cure.  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  He will consider his options and understands my preference would be likely to leaning toward radiation therapy with long-term androgen deprivation after our discussion today.  If he did proceed with surgical therapy, but plan would be to perform a non-nerve sparing robot-assisted laparoscopic radical prostatectomy and bilateral pelvic lymphadenectomy considering his locally advanced disease.  He will see Dr. Alen Blew and Dr. [name] later this morning for further discussion.  Cc: Dr. [name] Dr. Zola Button Dr. Lowella Bandy Amy Manson Allan, NP  A total of 85 minutes were spent in the overall care of the patient today with 65 minutes in direct face to face consultation.    Signatures Electronically signed by : Raynelle Bring, M.D.; Aug 14 2014  5:43PM EST

## 2014-08-14 NOTE — Progress Notes (Signed)
[facility]  Prostate Clinic Psychosocial Distress Screening Clinical Social Work  Clinical Social Work met with pt and his daughter, at [facility] to offer support, introduce self and review distress screening protocol. The patient scored a 0 on the Psychosocial Distress Thermometer which indicates no distress. Pt shared he has not been concerned about his diagnosis as he reports to have excellent family support. He feels good about whatever his plan for treatment maybe. CSW discussed how distress could increase or decrease based on current concerns. Pt agrees to reach out as needed. CSW reviewed options for additional support; Prostate Support Group and Pt & Family Support Team. CSW discussed common emotions patients experience and coping techniques. Pt aware to reach out to CSW as needed and was provided contact information.   Loren Racer, [place] Worker [place]  [place] Phone: [phone] Fax: [phone]

## 2014-08-17 ENCOUNTER — Telehealth: Payer: Self-pay | Admitting: Medical Oncology

## 2014-08-17 DIAGNOSIS — C61 Malignant neoplasm of prostate: Secondary | ICD-10-CM | POA: Diagnosis not present

## 2014-08-17 DIAGNOSIS — Z5111 Encounter for antineoplastic chemotherapy: Secondary | ICD-10-CM | POA: Diagnosis not present

## 2014-08-17 NOTE — Telephone Encounter (Signed)
Mr. Daniel Herrera called and states that he would like to proceed with Androgen Deprivation Therapy and radiation. He asked if he could get his injection early Monday 5/16 before he leaves town for a week. I explained that I will contact Dr. Alinda Money and he will have Dr. Janice Norrie schedule him for the injection. I explained that this is not an emergency and if he can get it Monday it will be ok to get it when he returns. He states he really wanted to do he surgery but after talking with the physicians this morning, he feel like the ADT and radiation is the best way to go. I informed Dr. Alinda Money of patient's decision.

## 2014-08-17 NOTE — Progress Notes (Signed)
Reviewed & agree with plan  

## 2014-08-17 NOTE — Addendum Note (Signed)
Encounter addended by: [name], MD on: 08/17/2014 12:09 PM<BR>     Documentation filed: Flowsheet VN, Clinical Notes, Dx Association, Orders

## 2014-09-03 DIAGNOSIS — M1990 Unspecified osteoarthritis, unspecified site: Secondary | ICD-10-CM | POA: Diagnosis not present

## 2014-09-03 DIAGNOSIS — Z9181 History of falling: Secondary | ICD-10-CM | POA: Diagnosis not present

## 2014-09-03 DIAGNOSIS — Z6827 Body mass index (BMI) 27.0-27.9, adult: Secondary | ICD-10-CM | POA: Diagnosis not present

## 2014-09-03 DIAGNOSIS — I10 Essential (primary) hypertension: Secondary | ICD-10-CM | POA: Diagnosis not present

## 2014-09-10 ENCOUNTER — Telehealth: Payer: Self-pay | Admitting: Radiation Oncology

## 2014-09-10 NOTE — Telephone Encounter (Signed)
Spoke with Daniel Herrera with Dr. Janice Norrie and patient is scheduled for gold seed placement on 7/13; arrival time 9:45a; they will mail out information; patient's SIM appt is on 7/22 @ 10am. [name] is agreeable to both.

## 2014-09-11 ENCOUNTER — Other Ambulatory Visit: Payer: Self-pay | Admitting: Radiation Oncology

## 2014-09-11 DIAGNOSIS — C61 Malignant neoplasm of prostate: Secondary | ICD-10-CM

## 2014-09-14 ENCOUNTER — Telehealth: Payer: Self-pay | Admitting: Medical Oncology

## 2014-09-14 ENCOUNTER — Encounter: Payer: Self-pay | Admitting: Medical Oncology

## 2014-09-14 ENCOUNTER — Telehealth: Payer: Self-pay | Admitting: *Deleted

## 2014-09-14 NOTE — Telephone Encounter (Signed)
CALLED PATIENT TO INFORM OF APPT. FOR GOLD SEED PLACEMENT ON 10-14-14- ARRIVAL TIME - 9:45 AM @ DR. BORDEN'S OFFICE AND HIS SIM ON 10/23/14 @ 10 AM @ DR. MANNING'S OFFICE, SPOKE WITH PATIENT AND HE IS AWARE OF THESE APPTS.

## 2014-09-14 NOTE — Telephone Encounter (Signed)
Mr. Verstraete called with concerns about his radiation. He would like to get his CT sim here at [facility] and get his treatments in [place].I explained to him that he has to get his CT sim and treatments at the same place. He has decided that he wants to come to [facility] for all. We reviewed his appointments for gold seed placements with Dr. Alinda Money and CT sim with Dr. [name]. I asked him to call me back with any questions or concerns. He voiced understanding.   Cira Rue, RN, BSN, [place]  [phone]  Fax [phone]

## 2014-09-14 NOTE — Progress Notes (Signed)
Oncology Nurse Navigator Documentation  Oncology Nurse Navigator Flowsheets 09/14/2014  Referral date to RadOnc/MedOnc 08/14/2014  Navigator Encounter Type Telephone- I spoke with Daniel Herrera regarding his radiation. He had wanted to get his CT sim here at [facility] and get his treatments in [place].I explained to him that he has to get his CT sim and treatments at the same place. He has decided that he wants to come to [facility] for all. We reviewed his appointments for gold seed placements with Dr. Alinda Money and CT sim with Dr. [name]. I asked him to call me back with any questions or concerns. He voiced understanding.  Treatment Phase Other  Time Spent with Patient 15

## 2014-09-17 DIAGNOSIS — Z6827 Body mass index (BMI) 27.0-27.9, adult: Secondary | ICD-10-CM | POA: Diagnosis not present

## 2014-09-17 DIAGNOSIS — I10 Essential (primary) hypertension: Secondary | ICD-10-CM | POA: Diagnosis not present

## 2014-10-14 DIAGNOSIS — C61 Malignant neoplasm of prostate: Secondary | ICD-10-CM | POA: Diagnosis not present

## 2014-10-23 ENCOUNTER — Ambulatory Visit
Admission: RE | Admit: 2014-10-23 | Discharge: 2014-10-23 | Disposition: A | Discharge status: Home / Self Care | Payer: Medicare Other | Source: Ambulatory Visit | Attending: Radiation Oncology | Admitting: Radiation Oncology

## 2014-10-23 DIAGNOSIS — C61 Malignant neoplasm of prostate: Secondary | ICD-10-CM

## 2014-10-23 DIAGNOSIS — Z510 Encounter for antineoplastic radiation therapy: Secondary | ICD-10-CM | POA: Insufficient documentation

## 2014-10-23 NOTE — Progress Notes (Signed)
  Radiation Oncology         [phone] ________________________________  Name: MY MADARIAGA MRN: [id number]  Date: 10/23/2014  DOB: [birth year]  SIMULATION AND TREATMENT PLANNING NOTE    ICD-9-CM ICD-10-CM   1. Malignant neoplasm of prostate 185 C61     DIAGNOSIS:  74 y.o. gentleman with stage T1c adenocarcinoma of the prostate with a Gleason's score of 4+4 and a PSA of 8.8 (on finasteride)    ICD-9-CM ICD-10-CM   1. Malignant neoplasm of prostate 185 C61     NARRATIVE:  The patient was brought to the [facility].  Identity was confirmed.  All relevant records and images related to the planned course of therapy were reviewed.  The patient freely provided informed written consent to proceed with treatment after reviewing the details related to the planned course of therapy. The consent form was witnessed and verified by the simulation staff.  Then, the patient was set-up in a stable reproducible supine position for radiation therapy.  A vacuum lock pillow device was custom fabricated to position his legs in a reproducible immobilized position.  Then, I performed a urethrogram under sterile conditions to identify the prostatic apex.  CT images were obtained.  Surface markings were placed.  The CT images were loaded into the planning software.  Then the prostate target and avoidance structures including the rectum, bladder, bowel and hips were contoured.  Treatment planning then occurred.  The radiation prescription was entered and confirmed.  A total of one complex treatment device were fabricated. I have requested : Intensity Modulated Radiotherapy (IMRT) is medically necessary for this case for the following reason:  Rectal sparing.[name]  PLAN:  The patient will receive 75 Gy in 40 fractions with cone down after 45 Gy.  This document serves as a record of services personally performed by [name], MD. It was created on his behalf by Arlyce Harman, a trained medical  scribe. The creation of this record is based on the scribe's personal observations and the provider's statements to them. This document has been checked and approved by the attending provider.    ________________________________  [name]. [name], M.D.

## 2014-10-27 DIAGNOSIS — J309 Allergic rhinitis, unspecified: Secondary | ICD-10-CM | POA: Diagnosis not present

## 2014-10-27 DIAGNOSIS — R739 Hyperglycemia, unspecified: Secondary | ICD-10-CM | POA: Diagnosis not present

## 2014-10-27 DIAGNOSIS — C61 Malignant neoplasm of prostate: Secondary | ICD-10-CM | POA: Diagnosis not present

## 2014-10-27 DIAGNOSIS — I10 Essential (primary) hypertension: Secondary | ICD-10-CM | POA: Diagnosis not present

## 2014-10-27 DIAGNOSIS — Z6827 Body mass index (BMI) 27.0-27.9, adult: Secondary | ICD-10-CM | POA: Diagnosis not present

## 2014-10-27 DIAGNOSIS — M1990 Unspecified osteoarthritis, unspecified site: Secondary | ICD-10-CM | POA: Diagnosis not present

## 2014-10-28 DIAGNOSIS — R739 Hyperglycemia, unspecified: Secondary | ICD-10-CM | POA: Diagnosis not present

## 2014-10-30 DIAGNOSIS — C61 Malignant neoplasm of prostate: Secondary | ICD-10-CM | POA: Diagnosis not present

## 2014-10-30 DIAGNOSIS — Z510 Encounter for antineoplastic radiation therapy: Secondary | ICD-10-CM | POA: Diagnosis not present

## 2014-11-02 ENCOUNTER — Telehealth: Payer: Self-pay | Admitting: Medical Oncology

## 2014-11-02 NOTE — Telephone Encounter (Signed)
Oncology Nurse Navigator Documentation  Oncology Nurse Navigator Flowsheets 09/14/2014 11/02/2014  Referral date to RadOnc/MedOnc 08/14/2014 -  Navigator Encounter Type Telephone Telephone- Mr. Cabiness called asking about his insurance. He is worried what his 20% of the cost will be. I explained to him that I do not do anything with billing but I will a contact person for him to call. He will start his radiation  Treatments in the morning and I will bring this information to him. I explained that I am sure the [facility] will work with him regarding payments. He voiced understanding.  Barriers/Navigation Needs - Financial  Interventions - Other  Time Spent with Patient - 15

## 2014-11-03 ENCOUNTER — Ambulatory Visit
Admission: RE | Admit: 2014-11-03 | Discharge: 2014-11-03 | Disposition: A | Discharge status: Home / Self Care | Payer: Medicare Other | Source: Ambulatory Visit | Attending: Radiation Oncology | Admitting: Radiation Oncology

## 2014-11-03 ENCOUNTER — Encounter: Payer: Self-pay | Admitting: Medical Oncology

## 2014-11-03 DIAGNOSIS — C61 Malignant neoplasm of prostate: Secondary | ICD-10-CM | POA: Diagnosis not present

## 2014-11-03 DIAGNOSIS — Z510 Encounter for antineoplastic radiation therapy: Secondary | ICD-10-CM | POA: Diagnosis not present

## 2014-11-03 NOTE — Progress Notes (Signed)
Oncology Nurse Navigator Documentation  Oncology Nurse Navigator Flowsheets 09/14/2014 11/02/2014 11/03/2014  Referral date to RadOnc/MedOnc 08/14/2014 - -  Navigator Encounter Type Telephone Telephone -  Patient Visit Type - - Radonc  Treatment Phase - - First Radiation Tx- Daniel Herrera here for his first radiation treatment. He is accompanied by his wife and daughter. He is in great spirits without any questions or concerns. We reviewed the importance of having a comfortable full bladder.He states he is not sure if he remembered this but he great bladder filling today per Dr. Valere Dross. He voiced understanding. I asked him to call me if any questions or concerns.   Barriers/Navigation Needs - Financial -  Interventions - Other -  Support Groups/Services - - Friends and Family  Time Spent with Patient - 19 62

## 2014-11-04 ENCOUNTER — Ambulatory Visit
Admission: RE | Admit: 2014-11-04 | Discharge: 2014-11-04 | Disposition: A | Discharge status: Home / Self Care | Payer: Medicare Other | Source: Ambulatory Visit | Attending: Radiation Oncology | Admitting: Radiation Oncology

## 2014-11-04 DIAGNOSIS — Z510 Encounter for antineoplastic radiation therapy: Secondary | ICD-10-CM | POA: Diagnosis not present

## 2014-11-04 DIAGNOSIS — C61 Malignant neoplasm of prostate: Secondary | ICD-10-CM | POA: Diagnosis not present

## 2014-11-05 ENCOUNTER — Ambulatory Visit
Admission: RE | Admit: 2014-11-05 | Discharge: 2014-11-05 | Disposition: A | Discharge status: Home / Self Care | Payer: Medicare Other | Source: Ambulatory Visit | Attending: Radiation Oncology | Admitting: Radiation Oncology

## 2014-11-05 DIAGNOSIS — C61 Malignant neoplasm of prostate: Secondary | ICD-10-CM | POA: Diagnosis not present

## 2014-11-05 DIAGNOSIS — Z510 Encounter for antineoplastic radiation therapy: Secondary | ICD-10-CM | POA: Diagnosis not present

## 2014-11-06 ENCOUNTER — Ambulatory Visit
Admission: RE | Admit: 2014-11-06 | Discharge: 2014-11-06 | Disposition: A | Discharge status: Home / Self Care | Payer: Medicare Other | Source: Ambulatory Visit | Attending: Radiation Oncology | Admitting: Radiation Oncology

## 2014-11-06 ENCOUNTER — Encounter: Payer: Self-pay | Admitting: Radiation Oncology

## 2014-11-06 VITALS — BP 130/56 | HR 71 | Resp 16 | Wt 203.3 lb

## 2014-11-06 DIAGNOSIS — C61 Malignant neoplasm of prostate: Secondary | ICD-10-CM

## 2014-11-06 DIAGNOSIS — Z510 Encounter for antineoplastic radiation therapy: Secondary | ICD-10-CM | POA: Diagnosis not present

## 2014-11-06 NOTE — Addendum Note (Signed)
Encounter addended by: Heywood Footman, RN on: 11/06/2014  9:16 AM<BR>     Documentation filed: Chief Complaint Section, Notes Section

## 2014-11-06 NOTE — Progress Notes (Addendum)
Weight and vitals stable. Denies pain. Reports diarrhea and constipation related to effects of colitis. Reports nocturia x 3. Denies dysuria or hematuria.Oriented patient to staff and routine of the clinic. Provided patient with RADIATION THERAPY AND YOU handbook then, reviewed pertinent information. Educated patient reference potential side effects and management such as, fatigue, diarrhea, and urinary/bladder changes. Encouraged patient to contact this RN with future needs and provided my business card. Patient verbalized understanding of all reviewed.   BP 130/56 mmHg  Pulse 71  Resp 16  Wt 203 lb 4.8 oz (92.216 kg) Wt Readings from Last 3 Encounters:  11/06/14 203 lb 4.8 oz (92.216 kg)  08/14/14 203 lb 9.6 oz (92.352 kg)  08/12/14 203 lb 3.2 oz (92.171 kg)

## 2014-11-06 NOTE — Progress Notes (Signed)
   Department of Radiation Oncology  Phone:  [phone] Fax:        [phone]  Weekly Treatment Note    Name: Daniel Herrera Date: 11/06/2014 MRN: [id number] DOB: 1940/09/24   Current dose: 7.2 Gy  Current fraction: 4   MEDICATIONS: Current Outpatient Prescriptions  Medication Sig Dispense Refill  . clobetasol ointment (TEMOVATE) 0.05 % APPLY EXTERNALLLY AS DIRECTED  1  . dutasteride (AVODART) 0.5 MG capsule Take 0.5 mg by mouth every evening.    . fish oil-omega-3 fatty acids 1000 MG capsule Take 1 g by mouth daily.    . fluticasone (VERAMYST) 27.5 MCG/SPRAY nasal spray Place 1 spray into the nose daily.    [name] levofloxacin (LEVAQUIN) 500 MG tablet   0  . levothyroxine (SYNTHROID, LEVOTHROID) 75 MCG tablet Take 75 mcg by mouth daily before breakfast.    . lisinopril (PRINIVIL,ZESTRIL) 10 MG tablet TK 1 T PO  QD  6  . LIVALO 4 MG TABS     . nabumetone (RELAFEN) 750 MG tablet Take 750 mg by mouth 2 (two) times daily.    [name] omeprazole (PRILOSEC) 20 MG capsule Take 20 mg by mouth daily.    . VOLTAREN 1 % GEL APP EXT AA QID PRN  6   No current facility-administered medications for this encounter.     ALLERGIES: Benadryl; Darvocet; and Darvon   LABORATORY DATA:  Lab Results  Component Value Date   WBC 5.8 12/17/2013   HGB 14.8 12/17/2013   HCT 43.8 12/17/2013   MCV 94.1 12/17/2013   PLT 181.0 12/17/2013   Lab Results  Component Value Date   NA 142 12/17/2013   K 4.0 12/17/2013   CL 108 12/17/2013   CO2 27 12/17/2013   Lab Results  Component Value Date   ALT 23 12/17/2013   AST 25 12/17/2013   ALKPHOS 70 12/17/2013   BILITOT 1.0 12/17/2013     NARRATIVE: Daniel Herrera was seen today for weekly treatment management. The chart was checked and the patient's films were reviewed.  Weight and vitals stable. Denies pain. Reports diarrhea and constipation related to effects of colitis. Reports nocturia x 3. Denies dysuria or hematuria.  PHYSICAL  EXAMINATION: weight is 203 lb 4.8 oz (92.216 kg). His blood pressure is 130/56 and his pulse is 71. His respiration is 16.        ASSESSMENT: The patient is doing satisfactorily with treatment.  PLAN: We will continue with the patient's radiation treatment as planned.     ------------------------------------------------  Jodelle Gross, MD, PhD  This document serves as a record of services personally performed by Kyung Rudd, MD. It was created on his behalf by Derek Mound, a trained medical scribe. The creation of this record is based on the scribe's personal observations and the provider's statements to them. This document has been checked and approved by the attending provider.

## 2014-11-09 ENCOUNTER — Ambulatory Visit
Admission: RE | Admit: 2014-11-09 | Discharge: 2014-11-09 | Disposition: A | Discharge status: Home / Self Care | Payer: Medicare Other | Source: Ambulatory Visit | Attending: Radiation Oncology | Admitting: Radiation Oncology

## 2014-11-09 DIAGNOSIS — Z510 Encounter for antineoplastic radiation therapy: Secondary | ICD-10-CM | POA: Diagnosis not present

## 2014-11-09 DIAGNOSIS — C61 Malignant neoplasm of prostate: Secondary | ICD-10-CM | POA: Diagnosis not present

## 2014-11-10 ENCOUNTER — Ambulatory Visit
Admission: RE | Admit: 2014-11-10 | Discharge: 2014-11-10 | Disposition: A | Discharge status: Home / Self Care | Payer: Medicare Other | Source: Ambulatory Visit | Attending: Radiation Oncology | Admitting: Radiation Oncology

## 2014-11-10 DIAGNOSIS — H43811 Vitreous degeneration, right eye: Secondary | ICD-10-CM | POA: Diagnosis not present

## 2014-11-10 DIAGNOSIS — C61 Malignant neoplasm of prostate: Secondary | ICD-10-CM | POA: Diagnosis not present

## 2014-11-10 DIAGNOSIS — Z510 Encounter for antineoplastic radiation therapy: Secondary | ICD-10-CM | POA: Diagnosis not present

## 2014-11-11 ENCOUNTER — Ambulatory Visit
Admission: RE | Admit: 2014-11-11 | Discharge: 2014-11-11 | Disposition: A | Discharge status: Home / Self Care | Payer: Medicare Other | Source: Ambulatory Visit | Attending: Radiation Oncology | Admitting: Radiation Oncology

## 2014-11-11 DIAGNOSIS — Z510 Encounter for antineoplastic radiation therapy: Secondary | ICD-10-CM | POA: Diagnosis not present

## 2014-11-11 DIAGNOSIS — C61 Malignant neoplasm of prostate: Secondary | ICD-10-CM | POA: Diagnosis not present

## 2014-11-12 ENCOUNTER — Ambulatory Visit
Admission: RE | Admit: 2014-11-12 | Discharge: 2014-11-12 | Disposition: A | Discharge status: Home / Self Care | Payer: Medicare Other | Source: Ambulatory Visit | Attending: Radiation Oncology | Admitting: Radiation Oncology

## 2014-11-12 ENCOUNTER — Encounter: Payer: Self-pay | Admitting: Radiation Oncology

## 2014-11-12 VITALS — BP 156/90 | HR 78 | Resp 16 | Wt 205.1 lb

## 2014-11-12 DIAGNOSIS — Z510 Encounter for antineoplastic radiation therapy: Secondary | ICD-10-CM | POA: Diagnosis not present

## 2014-11-12 DIAGNOSIS — C61 Malignant neoplasm of prostate: Secondary | ICD-10-CM

## 2014-11-12 NOTE — Progress Notes (Signed)
  Radiation Oncology         [phone]   Name: Daniel Herrera MRN: [id number]   Date: 11/12/2014  DOB: 1940-08-04   Weekly Radiation Therapy Management    ICD-9-CM ICD-10-CM   1. Malignant neoplasm of prostate 185 C61     Current Dose: 14.4 Gy  Planned Dose:  75 Gy  Narrative The patient presents for routine under treatment assessment. BP slightly elevated. Denies pain. Denies dysuria. Reports nocturia every two hours last night. Reports that it was his first case of nocturia. He attributes the nocturia to consuming nectarines. Reports urgency. Denies any GI complaints. Denies fatigue. Reports he took AVODART for years, but his insurance stopped covering it so he is now taking finasteride. Patient questions if he should continue to take finasteride. Set-up films were reviewed. The chart was checked.  Physical Findings  weight is 205 lb 1.6 oz (93.033 kg). His blood pressure is 156/90 and his pulse is 78. His respiration is 16.  Weight essentially stable. No significant changes.  Impression The patient is tolerating radiation.  Plan Continue treatment as planned. I advised the pt to continue taking his finasteride as directed.   This document serves as a record of services personally performed by [name], MD. It was created on his behalf by [name], a trained medical scribe. The creation of this record is based on the scribe's personal observations and the provider's statements to them. This document has been checked and approved by the attending provider.      [name] [name], M.D.

## 2014-11-12 NOTE — Progress Notes (Signed)
Weight stable. BP slightly elevated. Denies pain. Denies dysuria. Reports nocturia every two hours last night. Reports urgency. Denies any GI complaints. Denies fatigue. Reports he took AVODART for years than, insurance stopped covering it so he is now taking finasteride. Patient questions if he should continue to take finasteride.  BP 156/90 mmHg  Pulse 78  Resp 16  Wt 205 lb 1.6 oz (93.033 kg) Wt Readings from Last 3 Encounters:  11/12/14 205 lb 1.6 oz (93.033 kg)  11/06/14 203 lb 4.8 oz (92.216 kg)  08/14/14 203 lb 9.6 oz (92.352 kg)

## 2014-11-13 ENCOUNTER — Ambulatory Visit
Admission: RE | Admit: 2014-11-13 | Discharge: 2014-11-13 | Disposition: A | Discharge status: Home / Self Care | Payer: Medicare Other | Source: Ambulatory Visit | Attending: Radiation Oncology | Admitting: Radiation Oncology

## 2014-11-13 DIAGNOSIS — C61 Malignant neoplasm of prostate: Secondary | ICD-10-CM | POA: Diagnosis not present

## 2014-11-13 DIAGNOSIS — Z510 Encounter for antineoplastic radiation therapy: Secondary | ICD-10-CM | POA: Diagnosis not present

## 2014-11-16 ENCOUNTER — Ambulatory Visit
Admission: RE | Admit: 2014-11-16 | Discharge: 2014-11-16 | Disposition: A | Discharge status: Home / Self Care | Payer: Medicare Other | Source: Ambulatory Visit | Attending: Radiation Oncology | Admitting: Radiation Oncology

## 2014-11-16 DIAGNOSIS — C61 Malignant neoplasm of prostate: Secondary | ICD-10-CM | POA: Diagnosis not present

## 2014-11-16 DIAGNOSIS — Z510 Encounter for antineoplastic radiation therapy: Secondary | ICD-10-CM | POA: Diagnosis not present

## 2014-11-17 ENCOUNTER — Ambulatory Visit
Admission: RE | Admit: 2014-11-17 | Discharge: 2014-11-17 | Disposition: A | Discharge status: Home / Self Care | Payer: Medicare Other | Source: Ambulatory Visit | Attending: Radiation Oncology | Admitting: Radiation Oncology

## 2014-11-17 DIAGNOSIS — C61 Malignant neoplasm of prostate: Secondary | ICD-10-CM | POA: Diagnosis not present

## 2014-11-17 DIAGNOSIS — Z510 Encounter for antineoplastic radiation therapy: Secondary | ICD-10-CM | POA: Diagnosis not present

## 2014-11-18 ENCOUNTER — Ambulatory Visit
Admission: RE | Admit: 2014-11-18 | Discharge: 2014-11-18 | Disposition: A | Discharge status: Home / Self Care | Payer: Medicare Other | Source: Ambulatory Visit | Attending: Radiation Oncology | Admitting: Radiation Oncology

## 2014-11-18 ENCOUNTER — Encounter: Payer: Self-pay | Admitting: Medical Oncology

## 2014-11-18 DIAGNOSIS — C61 Malignant neoplasm of prostate: Secondary | ICD-10-CM | POA: Diagnosis not present

## 2014-11-18 DIAGNOSIS — Z510 Encounter for antineoplastic radiation therapy: Secondary | ICD-10-CM | POA: Diagnosis not present

## 2014-11-18 NOTE — Progress Notes (Signed)
Oncology Nurse Navigator Documentation  Oncology Nurse Navigator Flowsheets 11/02/2014 11/03/2014 11/18/2014  Referral date to RadOnc/MedOnc - - -  Navigator Encounter Type Telephone - Treatment- Mr. Pettet states he is doing well except his has increased fatigue and some rectal irritation but other wise doing well. He has been staying in out of the heat and taking naps. We discussed these are normal side effects of the radiation. I asked him to call me with any questions or concerns. He voiced understanding.  Patient Visit Type - Radonc Radonc  Treatment Phase - First Radiation Tx Treatment  Barriers/Navigation Needs Financial - -  Interventions Other - -  Support Groups/Services - Friends and Family Friends and Family  Time Spent with Patient 05 18 33

## 2014-11-19 ENCOUNTER — Ambulatory Visit
Admission: RE | Admit: 2014-11-19 | Discharge: 2014-11-19 | Disposition: A | Discharge status: Home / Self Care | Payer: Medicare Other | Source: Ambulatory Visit | Attending: Radiation Oncology | Admitting: Radiation Oncology

## 2014-11-19 DIAGNOSIS — C61 Malignant neoplasm of prostate: Secondary | ICD-10-CM | POA: Diagnosis not present

## 2014-11-19 DIAGNOSIS — Z510 Encounter for antineoplastic radiation therapy: Secondary | ICD-10-CM | POA: Diagnosis not present

## 2014-11-20 ENCOUNTER — Encounter: Payer: Self-pay | Admitting: Radiation Oncology

## 2014-11-20 ENCOUNTER — Ambulatory Visit
Admission: RE | Admit: 2014-11-20 | Discharge: 2014-11-20 | Disposition: A | Discharge status: Home / Self Care | Payer: Medicare Other | Source: Ambulatory Visit | Attending: Radiation Oncology | Admitting: Radiation Oncology

## 2014-11-20 VITALS — BP 127/66 | HR 75 | Resp 16 | Wt 202.7 lb

## 2014-11-20 DIAGNOSIS — C61 Malignant neoplasm of prostate: Secondary | ICD-10-CM

## 2014-11-20 DIAGNOSIS — Z510 Encounter for antineoplastic radiation therapy: Secondary | ICD-10-CM | POA: Diagnosis not present

## 2014-11-20 NOTE — Progress Notes (Signed)
  Radiation Oncology         [phone]   Name: Daniel Herrera MRN: [id number]   Date: 11/20/2014  DOB: [birth year]   Weekly Radiation Therapy Management    ICD-9-CM ICD-10-CM   1. Malignant neoplasm of prostate 185 C61     Current Dose: 25.2 Gy  Planned Dose:  75 Gy  Narrative The patient presents for routine under treatment assessment. Weight and vitals stable. Denies pain. Reports diarrhea and small stool. Reports he is using nystatin for a fungal infection in his mouth. Reports a burning sensation in his ankles bilaterally when he stands up in the morning. He notes that the sensation goes away after he is up for a bit, he is currently putting ointment on it. He denies rash. Reports mild dysuria. Reports urinary urgency continues. Reports nocturia x 4. Reports mild fatigue. He is currently taking finasteride which he feels does not help as well as avodart. He has reservations about taking flomax because previously his eyes wouldn't dilate while on it. Set-up films were reviewed. The chart was checked.  Physical Findings  weight is 202 lb 11.2 oz (91.944 kg). His blood pressure is 127/66 and his pulse is 75. His respiration is 16 and oxygen saturation is 100%.  Weight essentially stable. No significant changes.  Impression The patient is tolerating radiation.  Plan Continue treatment as planned.    This document serves as a record of services personally performed by [name], MD. It was created on his behalf by Arlyce Harman, a trained medical scribe. The creation of this record is based on the scribe's personal observations and the provider's statements to them. This document has been checked and approved by the attending provider.        [name] [name], M.D.

## 2014-11-20 NOTE — Progress Notes (Signed)
Weight and vitals stable. Denies pain. Reports diarrhea. Reports he is using nystatin for a fungal infection in his mouth. Reports a burning sensation in his ankles when he stands up in the morning. Reports mild dysuria. Reports urinary urgency continues. Reports nocturia x 4. Reports mild fatigue.   BP 127/66 mmHg  Pulse 75  Resp 16  Wt 202 lb 11.2 oz (91.944 kg)  SpO2 100% Wt Readings from Last 3 Encounters:  11/20/14 202 lb 11.2 oz (91.944 kg)  11/12/14 205 lb 1.6 oz (93.033 kg)  11/06/14 203 lb 4.8 oz (92.216 kg)

## 2014-11-23 ENCOUNTER — Ambulatory Visit
Admission: RE | Admit: 2014-11-23 | Discharge: 2014-11-23 | Disposition: A | Discharge status: Home / Self Care | Payer: Medicare Other | Source: Ambulatory Visit | Attending: Radiation Oncology | Admitting: Radiation Oncology

## 2014-11-23 DIAGNOSIS — Z510 Encounter for antineoplastic radiation therapy: Secondary | ICD-10-CM | POA: Diagnosis not present

## 2014-11-23 DIAGNOSIS — C61 Malignant neoplasm of prostate: Secondary | ICD-10-CM | POA: Diagnosis not present

## 2014-11-24 ENCOUNTER — Ambulatory Visit
Admission: RE | Admit: 2014-11-24 | Discharge: 2014-11-24 | Disposition: A | Discharge status: Home / Self Care | Payer: Medicare Other | Source: Ambulatory Visit | Attending: Radiation Oncology | Admitting: Radiation Oncology

## 2014-11-24 ENCOUNTER — Encounter: Payer: Self-pay | Admitting: Medical Oncology

## 2014-11-24 DIAGNOSIS — C61 Malignant neoplasm of prostate: Secondary | ICD-10-CM | POA: Diagnosis not present

## 2014-11-24 DIAGNOSIS — Z510 Encounter for antineoplastic radiation therapy: Secondary | ICD-10-CM | POA: Diagnosis not present

## 2014-11-25 ENCOUNTER — Ambulatory Visit
Admission: RE | Admit: 2014-11-25 | Discharge: 2014-11-25 | Disposition: A | Discharge status: Home / Self Care | Payer: Medicare Other | Source: Ambulatory Visit | Attending: Radiation Oncology | Admitting: Radiation Oncology

## 2014-11-25 DIAGNOSIS — C61 Malignant neoplasm of prostate: Secondary | ICD-10-CM | POA: Diagnosis not present

## 2014-11-25 DIAGNOSIS — Z510 Encounter for antineoplastic radiation therapy: Secondary | ICD-10-CM | POA: Diagnosis not present

## 2014-11-26 ENCOUNTER — Ambulatory Visit
Admission: RE | Admit: 2014-11-26 | Discharge: 2014-11-26 | Disposition: A | Discharge status: Home / Self Care | Payer: Medicare Other | Source: Ambulatory Visit | Attending: Radiation Oncology | Admitting: Radiation Oncology

## 2014-11-26 ENCOUNTER — Encounter: Payer: Self-pay | Admitting: Radiation Oncology

## 2014-11-26 VITALS — BP 138/78 | HR 79 | Resp 16 | Wt 203.4 lb

## 2014-11-26 DIAGNOSIS — C61 Malignant neoplasm of prostate: Secondary | ICD-10-CM | POA: Diagnosis not present

## 2014-11-26 DIAGNOSIS — Z510 Encounter for antineoplastic radiation therapy: Secondary | ICD-10-CM | POA: Diagnosis not present

## 2014-11-26 MED ORDER — DUTASTERIDE 0.5 MG PO CAPS: 0.5000 mg | ORAL_CAPSULE | Freq: Every evening | ORAL | Status: DC

## 2014-11-26 NOTE — Progress Notes (Signed)
  Radiation Oncology         [phone]   Name: Daniel Herrera MRN: [id number]   Date: 11/26/2014  DOB: 11/08/1940   Weekly Radiation Therapy Management    ICD-9-CM ICD-10-CM   1. Malignant neoplasm of prostate 185 C61 dutasteride (AVODART) 0.5 MG capsule    Current Dose: 32.4 Gy  Planned Dose:  75 Gy  Narrative The patient presents for routine under treatment assessment. Weight and vitals stable. Denies pain. Reports diarrhea continue. Reports fungal infection in mouth is clearing. Reports burning of ankles has ceased. Reports mild dysuria continues but, is no worse. Reports urinary urgency continues. Reports rectal irritation related to effect of diarrhea. Plans to use Preparation H to alleviate this irritation. Reports nocturia every two hours. Reports mild fatigue. Patient believes finasteride does not help as much as previously prescribed avodart. Set-up films were reviewed. The chart was checked.  Physical Findings  weight is 203 lb 6.4 oz (92.262 kg). His blood pressure is 138/78 and his pulse is 79. His respiration is 16.  Weight essentially stable. No significant changes.  Impression The patient is tolerating radiation.  Plan Continue treatment as planned. I have advised the patient to try imodium A-D to alleviate his diarrhea. I have written a prescription for avodart 0.5 mg to be taken at night, per patient request. The patient will discontinue finasteride.    This document serves as a record of services personally performed by [name], MD. It was created on his behalf by Arlyce Harman, a trained medical scribe. The creation of this record is based on the scribe's personal observations and the provider's statements to them. This document has been checked and approved by the attending provider.        [name] [name], M.D.

## 2014-11-26 NOTE — Progress Notes (Signed)
Weight and vitals stable. Denies pain. Reports diarrhea continue. Reports fungal infection in mouth is clearing. Reports burning of ankles has ceased. Reports mild dysuria continues but, is no worse. Reports urinary urgency continues. Reports rectal irritation related to effect of diarrhea. Reports nocturia every two hours. Reports mild fatigue.  BP 138/78 mmHg  Pulse 79  Resp 16  Wt 203 lb 6.4 oz (92.262 kg) Wt Readings from Last 3 Encounters:  11/26/14 203 lb 6.4 oz (92.262 kg)  11/20/14 202 lb 11.2 oz (91.944 kg)  11/12/14 205 lb 1.6 oz (93.033 kg)

## 2014-11-27 ENCOUNTER — Ambulatory Visit
Admission: RE | Admit: 2014-11-27 | Discharge: 2014-11-27 | Disposition: A | Discharge status: Home / Self Care | Payer: Medicare Other | Source: Ambulatory Visit | Attending: Radiation Oncology | Admitting: Radiation Oncology

## 2014-11-27 ENCOUNTER — Telehealth: Payer: Self-pay | Admitting: Radiation Oncology

## 2014-11-27 DIAGNOSIS — Z510 Encounter for antineoplastic radiation therapy: Secondary | ICD-10-CM | POA: Diagnosis not present

## 2014-11-27 DIAGNOSIS — C61 Malignant neoplasm of prostate: Secondary | ICD-10-CM | POA: Diagnosis not present

## 2014-11-27 NOTE — Telephone Encounter (Signed)
Patient's insurance does not cover brand name Avodart. Prior authorization for brand name was denied. Generic Avodart was approved by Dr. [name]. Phoned Mickey at [facility] switching brand to generic.

## 2014-11-30 ENCOUNTER — Ambulatory Visit
Admission: RE | Admit: 2014-11-30 | Discharge: 2014-11-30 | Disposition: A | Discharge status: Home / Self Care | Payer: Medicare Other | Source: Ambulatory Visit | Attending: Radiation Oncology | Admitting: Radiation Oncology

## 2014-11-30 DIAGNOSIS — Z510 Encounter for antineoplastic radiation therapy: Secondary | ICD-10-CM | POA: Diagnosis not present

## 2014-11-30 DIAGNOSIS — C61 Malignant neoplasm of prostate: Secondary | ICD-10-CM | POA: Diagnosis not present

## 2014-12-01 ENCOUNTER — Ambulatory Visit
Admission: RE | Admit: 2014-12-01 | Discharge: 2014-12-01 | Disposition: A | Discharge status: Home / Self Care | Payer: Medicare Other | Source: Ambulatory Visit | Attending: Radiation Oncology | Admitting: Radiation Oncology

## 2014-12-01 DIAGNOSIS — G5602 Carpal tunnel syndrome, left upper limb: Secondary | ICD-10-CM | POA: Diagnosis not present

## 2014-12-01 DIAGNOSIS — C61 Malignant neoplasm of prostate: Secondary | ICD-10-CM | POA: Diagnosis not present

## 2014-12-01 DIAGNOSIS — Z510 Encounter for antineoplastic radiation therapy: Secondary | ICD-10-CM | POA: Diagnosis not present

## 2014-12-01 DIAGNOSIS — M65332 Trigger finger, left middle finger: Secondary | ICD-10-CM | POA: Diagnosis not present

## 2014-12-01 DIAGNOSIS — G5601 Carpal tunnel syndrome, right upper limb: Secondary | ICD-10-CM | POA: Diagnosis not present

## 2014-12-01 DIAGNOSIS — M65331 Trigger finger, right middle finger: Secondary | ICD-10-CM | POA: Diagnosis not present

## 2014-12-02 ENCOUNTER — Ambulatory Visit
Admission: RE | Admit: 2014-12-02 | Discharge: 2014-12-02 | Disposition: A | Discharge status: Home / Self Care | Payer: Medicare Other | Source: Ambulatory Visit | Attending: Radiation Oncology | Admitting: Radiation Oncology

## 2014-12-02 DIAGNOSIS — H26493 Other secondary cataract, bilateral: Secondary | ICD-10-CM | POA: Diagnosis not present

## 2014-12-02 DIAGNOSIS — H43811 Vitreous degeneration, right eye: Secondary | ICD-10-CM | POA: Diagnosis not present

## 2014-12-02 DIAGNOSIS — C61 Malignant neoplasm of prostate: Secondary | ICD-10-CM | POA: Diagnosis not present

## 2014-12-02 DIAGNOSIS — Z510 Encounter for antineoplastic radiation therapy: Secondary | ICD-10-CM | POA: Diagnosis not present

## 2014-12-03 ENCOUNTER — Encounter: Payer: Self-pay | Admitting: Medical Oncology

## 2014-12-03 ENCOUNTER — Ambulatory Visit
Admission: RE | Admit: 2014-12-03 | Discharge: 2014-12-03 | Disposition: A | Discharge status: Home / Self Care | Payer: Medicare Other | Source: Ambulatory Visit | Attending: Radiation Oncology | Admitting: Radiation Oncology

## 2014-12-03 DIAGNOSIS — Z510 Encounter for antineoplastic radiation therapy: Secondary | ICD-10-CM | POA: Diagnosis not present

## 2014-12-03 DIAGNOSIS — C61 Malignant neoplasm of prostate: Secondary | ICD-10-CM | POA: Diagnosis not present

## 2014-12-04 ENCOUNTER — Encounter: Payer: Self-pay | Admitting: Radiation Oncology

## 2014-12-04 ENCOUNTER — Ambulatory Visit
Admission: RE | Admit: 2014-12-04 | Discharge: 2014-12-04 | Disposition: A | Discharge status: Home / Self Care | Payer: Medicare Other | Source: Ambulatory Visit | Attending: Radiation Oncology | Admitting: Radiation Oncology

## 2014-12-04 VITALS — BP 146/89 | HR 68 | Resp 16 | Wt 198.2 lb

## 2014-12-04 DIAGNOSIS — C61 Malignant neoplasm of prostate: Secondary | ICD-10-CM | POA: Diagnosis not present

## 2014-12-04 DIAGNOSIS — Z510 Encounter for antineoplastic radiation therapy: Secondary | ICD-10-CM | POA: Diagnosis not present

## 2014-12-04 MED ORDER — HYDROCORTISONE 2.5 % RE CREA: 1.0000 "application " | TOPICAL_CREAM | Freq: Two times a day (BID) | RECTAL | Status: DC

## 2014-12-04 NOTE — Progress Notes (Signed)
Weight stable. BP slightly elevated. Reports a huge difference since starting Avodart on Monday. Reports last night he was up every hour voiding. Reports dysuria associated with urination. Denies hematuria. Reports soft stools. Reports diarrhea has resolved. Reports rectal irritation. Requesting prescription for hydrocortisone acetate 25 mg suppository. Reports fatigue.   BP 146/89 mmHg  Pulse 68  Resp 16  Wt 198 lb 3.2 oz (89.903 kg) Wt Readings from Last 3 Encounters:  12/04/14 198 lb 3.2 oz (89.903 kg)  11/26/14 203 lb 6.4 oz (92.262 kg)  11/20/14 202 lb 11.2 oz (91.944 kg)

## 2014-12-04 NOTE — Progress Notes (Signed)
  Radiation Oncology         [phone]   Name: Daniel Herrera MRN: [id number]   Date: 12/04/2014  DOB: May 20, 1940   Weekly Radiation Therapy Management    ICD-9-CM ICD-10-CM   1. Malignant neoplasm of prostate 185 C61     Current Dose: 43.2 Gy  Planned Dose:  75 Gy  Narrative The patient presents for routine under treatment assessment. Weight stable. BP slightly elevated. Reports a huge difference since starting Avodart on Monday. Reports last night he was up every hour voiding. Reports dysuria associated with urination. Denies hematuria. Reports soft stools. Reports diarrhea has resolved. Reports rectal irritation. Requesting prescription for hydrocortisone acetate 25 mg suppository. Reports fatigue. Patient reports that Tylenol makes his feet swell. Set-up films were reviewed. The chart was checked.  Physical Findings  weight is 198 lb 3.2 oz (89.903 kg). His blood pressure is 146/89 and his pulse is 68. His respiration is 16.  Weight essentially stable. No significant changes.  Impression The patient is tolerating radiation.  Plan Continue treatment as planned. I have advised that Aleve is okay to take for dysuria. I have prescribed Proctosol with hydrocortisone suppository 2x daily.   This document serves as a record of services personally performed by [name], MD. It was created on his behalf by Arlyce Harman, a trained medical scribe. The creation of this record is based on the scribe's personal observations and the provider's statements to them. This document has been checked and approved by the attending provider.        [name] [name], M.D.

## 2014-12-08 ENCOUNTER — Ambulatory Visit
Admission: RE | Admit: 2014-12-08 | Discharge: 2014-12-08 | Disposition: A | Discharge status: Home / Self Care | Payer: Medicare Other | Source: Ambulatory Visit | Attending: Radiation Oncology | Admitting: Radiation Oncology

## 2014-12-08 ENCOUNTER — Telehealth: Payer: Self-pay | Admitting: Medical Oncology

## 2014-12-08 DIAGNOSIS — C61 Malignant neoplasm of prostate: Secondary | ICD-10-CM | POA: Diagnosis not present

## 2014-12-08 DIAGNOSIS — Z510 Encounter for antineoplastic radiation therapy: Secondary | ICD-10-CM | POA: Diagnosis not present

## 2014-12-08 NOTE — Telephone Encounter (Signed)
Mr. Badie here for radiation this morning. He states that he has been having pain since he started his radiation. He has a kidney stone that was seen on his CT scan. He states the radiation therapist told him the pain could be the stone. He asked me what physician should he contact. I asked him to call Dr. Alinda Money and speak with his nurse. He voiced understanding.

## 2014-12-09 ENCOUNTER — Ambulatory Visit (INDEPENDENT_AMBULATORY_CARE_PROVIDER_SITE_OTHER): Payer: Self-pay | Admitting: Neurology

## 2014-12-09 ENCOUNTER — Ambulatory Visit (INDEPENDENT_AMBULATORY_CARE_PROVIDER_SITE_OTHER): Payer: Medicare Other | Admitting: Neurology

## 2014-12-09 ENCOUNTER — Ambulatory Visit
Admission: RE | Admit: 2014-12-09 | Discharge: 2014-12-09 | Disposition: A | Discharge status: Home / Self Care | Payer: Medicare Other | Source: Ambulatory Visit | Attending: Radiation Oncology | Admitting: Radiation Oncology

## 2014-12-09 DIAGNOSIS — C61 Malignant neoplasm of prostate: Secondary | ICD-10-CM | POA: Diagnosis not present

## 2014-12-09 DIAGNOSIS — G5603 Carpal tunnel syndrome, bilateral upper limbs: Secondary | ICD-10-CM

## 2014-12-09 DIAGNOSIS — R300 Dysuria: Secondary | ICD-10-CM | POA: Diagnosis not present

## 2014-12-09 DIAGNOSIS — G5601 Carpal tunnel syndrome, right upper limb: Secondary | ICD-10-CM | POA: Diagnosis not present

## 2014-12-09 DIAGNOSIS — Z510 Encounter for antineoplastic radiation therapy: Secondary | ICD-10-CM | POA: Diagnosis not present

## 2014-12-09 DIAGNOSIS — N401 Enlarged prostate with lower urinary tract symptoms: Secondary | ICD-10-CM | POA: Diagnosis not present

## 2014-12-09 DIAGNOSIS — Z0289 Encounter for other administrative examinations: Secondary | ICD-10-CM

## 2014-12-09 DIAGNOSIS — G5602 Carpal tunnel syndrome, left upper limb: Secondary | ICD-10-CM | POA: Diagnosis not present

## 2014-12-09 NOTE — Progress Notes (Signed)
  [facility]    Provider:  Dr Jaynee Eagles Referring Provider: [name], NP Primary Care Physician:  [name], NP   History:  Daniel Herrera is a 74 y.o. male here as a referral from Dr. Manson Allan for hand pain. Symptoms on the right include numbness and tingling in the first 4 digits. Symptoms in the left hand include numbness and tingling in just digit 3. Describes mostly pain and little weakness, no problems with grip. Has nocturnal awakenings for pain in the hands. No neck pain.   Summary:   Nerve Conduction Studies were performed on the bilateral upper extremities.  The right median APB motor nerve showed prolonged distal onset latency (10 ms, N<4.0) with decreased amplitude (0.84mV, N>3) The right Median 2nd Digit sensory nerve showed no response F Wave studies indicate that the right Median F wave has delayed latency(37, N<59ms).  The left median APB motor nerve showed prolonged distal onset latency (5.6 ms, N<4.0). The left Median 2nd Digit sensory nerve showed prolonged distal peak latency (5.0 ms, N<3.9).  F Wave studies indicate that the left Median F wave has delayed latency(38, N<73ms).   Bilateral Ulnar ADM motor nerves were within normal limits. F Wave studies indicate that the bilateral Ulnar F waves have normal latencies The bilateralUlnar 5th digit sensory nerves were within normal limits.  EMG needle study of selected bilateral upper extremity muscles was performed. The right Opponens Pollicis muscle showed increased spontaneous activity(positive sharp waves and fibrillation potentials), giant motor unit amplitude and diminished recruitment. The Left Opponens Pollicis muscle was within normal limits. The following muscles were normal bilaterally: Deltoid, Triceps, Pronator Teres, First Dorsal Interosseous, C7/C8 paraspinals.  Conclusion: This is an abnormal study. There is electrophysiologic evidence of right severe and left moderately-severe Carpal  Tunnel Syndrome. No suggestion of polyneuropathy or radiculopathy. Clinical correlation recommended.   Sarina Ill, MD  [facility] [address] [place], [place] [address]  Phone [phone] Fax [phone]

## 2014-12-10 ENCOUNTER — Ambulatory Visit
Admission: RE | Admit: 2014-12-10 | Discharge: 2014-12-10 | Disposition: A | Discharge status: Home / Self Care | Payer: Medicare Other | Source: Ambulatory Visit | Attending: Radiation Oncology | Admitting: Radiation Oncology

## 2014-12-10 ENCOUNTER — Encounter: Payer: Self-pay | Admitting: Radiation Oncology

## 2014-12-10 VITALS — BP 132/76 | HR 67 | Resp 16 | Wt 198.8 lb

## 2014-12-10 DIAGNOSIS — C61 Malignant neoplasm of prostate: Secondary | ICD-10-CM | POA: Diagnosis not present

## 2014-12-10 DIAGNOSIS — Z510 Encounter for antineoplastic radiation therapy: Secondary | ICD-10-CM | POA: Diagnosis not present

## 2014-12-10 NOTE — Progress Notes (Signed)
  Radiation Oncology         [phone]   Name: Daniel Herrera MRN: [id number]   Date: 12/10/2014  DOB: November 05, 1940   Weekly Radiation Therapy Management    ICD-9-CM ICD-10-CM   1. Malignant neoplasm of prostate 185 C61     Current Dose: 48.6 Gy  Planned Dose:  75 Gy  Narrative The patient presents for routine under treatment assessment. Weight and vitals stable. Reports dysuria continues without change. Denies hematuria. Reports nocturia x 4. Reports fatigue. Reports hydrocortisone cream is helping with rectal irritation. Reports some diarrhea this week. Reports rectal pain has resolved. Set-up films were reviewed. The chart was checked.  Physical Findings  weight is 198 lb 12.8 oz (90.175 kg). His blood pressure is 132/76 and his pulse is 67. His respiration is 16.  Weight essentially stable. No significant changes.  Impression The patient is tolerating radiation.  Plan Continue treatment as planned.    This document serves as a record of services personally performed by [name], MD. It was created on his behalf by Arlyce Harman, a trained medical scribe. The creation of this record is based on the scribe's personal observations and the provider's statements to them. This document has been checked and approved by the attending provider.        [name] [name], M.D.

## 2014-12-10 NOTE — Progress Notes (Signed)
Weight and vitals stable. Reports dysuria continues without change. Denies hematuria. Reports nocturia x 4. Reports fatigue. Reports hydrocortisone cream is helping with rectal irritation. Reports some diarrhea this week. Report rectal pain has resolved.  BP 132/76 mmHg  Pulse 67  Resp 16  Wt 198 lb 12.8 oz (90.175 kg) Wt Readings from Last 3 Encounters:  12/10/14 198 lb 12.8 oz (90.175 kg)  12/04/14 198 lb 3.2 oz (89.903 kg)  11/26/14 203 lb 6.4 oz (92.262 kg)

## 2014-12-11 ENCOUNTER — Ambulatory Visit
Admission: RE | Admit: 2014-12-11 | Discharge: 2014-12-11 | Disposition: A | Discharge status: Home / Self Care | Payer: Medicare Other | Source: Ambulatory Visit | Attending: Radiation Oncology | Admitting: Radiation Oncology

## 2014-12-11 DIAGNOSIS — C61 Malignant neoplasm of prostate: Secondary | ICD-10-CM | POA: Diagnosis not present

## 2014-12-11 DIAGNOSIS — Z510 Encounter for antineoplastic radiation therapy: Secondary | ICD-10-CM | POA: Diagnosis not present

## 2014-12-12 NOTE — Progress Notes (Signed)
See procedure note.

## 2014-12-13 NOTE — Procedures (Signed)
[facility]    Provider:  Dr Jaynee Eagles Referring Provider: [name], NP Primary Care Physician:  [name], NP   History:  Daniel Herrera is a 74 y.o. male here as a referral from Dr. Manson Allan for hand pain. Symptoms on the right include numbness and tingling in the first 4 digits. Symptoms in the left hand include numbness and tingling in just digit 3. Describes mostly pain and little weakness, no problems with grip. Has nocturnal awakenings for pain in the hands. No neck pain.   Summary:   Nerve Conduction Studies were performed on the bilateral upper extremities.  The right median APB motor nerve showed prolonged distal onset latency (10 ms, N<4.0) with decreased amplitude (0.23mV, N>3) The right Median 2nd Digit sensory nerve showed no response F Wave studies indicate that the right Median F wave has delayed latency(37, N<50ms).  The left median APB motor nerve showed prolonged distal onset latency (5.6 ms, N<4.0). The left Median 2nd Digit sensory nerve showed prolonged distal peak latency (5.0 ms, N<3.9).  F Wave studies indicate that the left Median F wave has delayed latency(38, N<51ms).   Bilateral Ulnar ADM motor nerves were within normal limits. F Wave studies indicate that the bilateral Ulnar F waves have normal latencies The bilateralUlnar 5th digit sensory nerves were within normal limits.  EMG needle study of selected bilateral upper extremity muscles was performed. The right Opponens Pollicis muscle showed increased spontaneous activity(positive sharp waves and fibrillation potentials), giant motor unit amplitude and diminished recruitment. The Left Opponens Pollicis muscle was within normal limits. The following muscles were normal bilaterally: Deltoid, Triceps, Pronator Teres, First Dorsal Interosseous, C7/C8 paraspinals.  Conclusion: This is an abnormal study. There is electrophysiologic evidence of right severe and left moderately-severe Carpal Tunnel  Syndrome. No suggestion of polyneuropathy or radiculopathy. Clinical correlation recommended.   Sarina Ill, MD  [facility] [address] [place], [place] [address]  Phone [phone] Fax [phone]

## 2014-12-14 ENCOUNTER — Ambulatory Visit
Admission: RE | Admit: 2014-12-14 | Discharge: 2014-12-14 | Disposition: A | Discharge status: Home / Self Care | Payer: Medicare Other | Source: Ambulatory Visit | Attending: Radiation Oncology | Admitting: Radiation Oncology

## 2014-12-14 ENCOUNTER — Encounter: Payer: Self-pay | Admitting: Medical Oncology

## 2014-12-14 DIAGNOSIS — Z510 Encounter for antineoplastic radiation therapy: Secondary | ICD-10-CM | POA: Diagnosis not present

## 2014-12-14 DIAGNOSIS — C61 Malignant neoplasm of prostate: Secondary | ICD-10-CM | POA: Diagnosis not present

## 2014-12-14 NOTE — Progress Notes (Signed)
Oncology Nurse Navigator Documentation  Oncology Nurse Navigator Flowsheets 12/03/2014 12/08/2014 12/14/2014  Referral date to RadOnc/MedOnc - - -  Navigator Encounter Type Treatment Treatment -  Patient Visit Type Radonc Radonc Radonc- Daniel Herrera saw [name], NP with [facility] last week with burning during urination.He told him if it worked he would be happy to call in a prescription.  He gave him some samples of Uribel. He states this has really helped him and asked if I would call Daniel Herrera and have him call him in a prescription. I called [facility] and they will give this message to Daniel Herrera and when it has been called in they will contact Daniel Herrera.  Treatment Phase Treatment Treatment Treatment  Barriers/Navigation Needs No barriers at this time No barriers at this time -  Interventions - Other Coordination of Care  Coordination of Care - - Other  Support Groups/Services Friends and Family Friends and Family Friends and Family  Time Spent with Patient [phone]

## 2014-12-15 ENCOUNTER — Ambulatory Visit
Admission: RE | Admit: 2014-12-15 | Discharge: 2014-12-15 | Disposition: A | Discharge status: Home / Self Care | Payer: Medicare Other | Source: Ambulatory Visit | Attending: Radiation Oncology | Admitting: Radiation Oncology

## 2014-12-15 DIAGNOSIS — C61 Malignant neoplasm of prostate: Secondary | ICD-10-CM | POA: Diagnosis not present

## 2014-12-15 DIAGNOSIS — Z510 Encounter for antineoplastic radiation therapy: Secondary | ICD-10-CM | POA: Diagnosis not present

## 2014-12-16 ENCOUNTER — Ambulatory Visit
Admission: RE | Admit: 2014-12-16 | Discharge: 2014-12-16 | Disposition: A | Discharge status: Home / Self Care | Payer: Medicare Other | Source: Ambulatory Visit | Attending: Radiation Oncology | Admitting: Radiation Oncology

## 2014-12-16 DIAGNOSIS — C61 Malignant neoplasm of prostate: Secondary | ICD-10-CM | POA: Diagnosis not present

## 2014-12-16 DIAGNOSIS — Z510 Encounter for antineoplastic radiation therapy: Secondary | ICD-10-CM | POA: Diagnosis not present

## 2014-12-17 ENCOUNTER — Ambulatory Visit
Admission: RE | Admit: 2014-12-17 | Discharge: 2014-12-17 | Disposition: A | Discharge status: Home / Self Care | Payer: Medicare Other | Source: Ambulatory Visit | Attending: Radiation Oncology | Admitting: Radiation Oncology

## 2014-12-17 DIAGNOSIS — Z510 Encounter for antineoplastic radiation therapy: Secondary | ICD-10-CM | POA: Diagnosis not present

## 2014-12-17 DIAGNOSIS — C61 Malignant neoplasm of prostate: Secondary | ICD-10-CM | POA: Diagnosis not present

## 2014-12-18 ENCOUNTER — Ambulatory Visit
Admission: RE | Admit: 2014-12-18 | Discharge: 2014-12-18 | Disposition: A | Discharge status: Home / Self Care | Payer: Medicare Other | Source: Ambulatory Visit | Attending: Radiation Oncology | Admitting: Radiation Oncology

## 2014-12-18 ENCOUNTER — Encounter: Payer: Self-pay | Admitting: Radiation Oncology

## 2014-12-18 VITALS — BP 153/82 | HR 75 | Temp 97.7°F | Resp 20 | Wt 200.1 lb

## 2014-12-18 DIAGNOSIS — Z510 Encounter for antineoplastic radiation therapy: Secondary | ICD-10-CM | POA: Diagnosis not present

## 2014-12-18 DIAGNOSIS — C61 Malignant neoplasm of prostate: Secondary | ICD-10-CM | POA: Diagnosis not present

## 2014-12-18 NOTE — Progress Notes (Signed)
  Radiation Oncology         [phone]   Name: Daniel Herrera MRN: [id number]   Date: 12/18/2014  DOB: July 29, 1940   Weekly Radiation Therapy Management    ICD-9-CM ICD-10-CM   1. Malignant neoplasm of prostate 185 C61    Malignant neoplasm of prostate  Current Dose: Reviewed  Planned Dose:  75 Gy  Narrative The patient presents for routine under treatment assessment for today's radiation oncology appointment. Thirty-three treatments have been completed at this point. "Last night I was up every hour. It's burning. I've had that problem since day one," the patient stated in regards to his most concerning symptom of problem starting urine flow. He confirmed that he cannot take the medication Floxmax because of ocular issues, so he is taking another medication. "The stream is not a problem, just in the penis right near the end, when it gets right there, it hurts really bad." His bowels have improved, his appetite is good, but he reports symptoms of fatigue. He visited Dr. Noberto Herrera for a "urinary test" and everything "was fine." "He gave me some samples of medicine that turned my urine real green," the patient stated in regards to Dr. Hayes Herrera visit and that this did not alleviate his symptom of burning in the tip of the penis. The patients voice was hoarse. The chart was checked. Set-up films were reviewed.  Physical Findings  weight is 200 lb 1.6 oz (90.765 kg). His oral temperature is 97.7 F (36.5 C). His blood pressure is 153/82 and his pulse is 75. His respiration is 20.  Weight essentially stable. There is no significant changes to the status of the paients overall health to be noted at this time.   Impression Daniel Herrera is a 74 year old gentleman presenting to clinic in regards to his malignant neoplasm of prostate. The patient is tolerating radiation. If the patients major complaint worsens or does not improve, moving forward, he may need to be evaluated for injury (upon placement of the  catheter), schedule a follow-up appointment with neurology, or evaluated for a urinary track infection.  Plan The patient is advised to continue treatment as planned. All vocalized questions and concerns have been addressed. The patient has been advised of healthy methods of management in regards to his vocalized symptoms. The patient is aware of his follow-up appointment with radiation oncology to take place next week, as scheduled. If the patient develops any further questions or concerns in regards to his treatment and recovery, he has been encouraged to contact Dr. [name], MD.    This document serves as a record of services personally performed by [name], MD. It was created on his behalf by Lenn Cal, a trained medical scribe. The creation of this record is based on the scribe's personal observations and the provider's statements to them. This document has been checked and approved by the attending provider.  ____________________________________________       [name]. [name], M.D.

## 2014-12-18 NOTE — Progress Notes (Addendum)
weekly rad tx prostate, hard time starting his urine, still dysuria since day one, nocturia last night every 1 hour, bowels have improved, appetite good, fatigued   8:32 AM BP 153/82 mmHg  Pulse 75  Temp(Src) 97.7 F (36.5 C) (Oral)  Resp 20  Wt 200 lb 1.6 oz (90.765 kg)  Wt Readings from Last 3 Encounters:  12/18/14 200 lb 1.6 oz (90.765 kg)  12/10/14 198 lb 12.8 oz (90.175 kg)  12/04/14 198 lb 3.2 oz (89.903 kg)

## 2014-12-21 ENCOUNTER — Ambulatory Visit
Admission: RE | Admit: 2014-12-21 | Discharge: 2014-12-21 | Disposition: A | Discharge status: Home / Self Care | Payer: Medicare Other | Source: Ambulatory Visit | Attending: Radiation Oncology | Admitting: Radiation Oncology

## 2014-12-21 DIAGNOSIS — Z510 Encounter for antineoplastic radiation therapy: Secondary | ICD-10-CM | POA: Diagnosis not present

## 2014-12-21 DIAGNOSIS — C61 Malignant neoplasm of prostate: Secondary | ICD-10-CM | POA: Diagnosis not present

## 2014-12-22 ENCOUNTER — Ambulatory Visit
Admission: RE | Admit: 2014-12-22 | Discharge: 2014-12-22 | Disposition: A | Discharge status: Home / Self Care | Payer: Medicare Other | Source: Ambulatory Visit | Attending: Radiation Oncology | Admitting: Radiation Oncology

## 2014-12-22 DIAGNOSIS — Z510 Encounter for antineoplastic radiation therapy: Secondary | ICD-10-CM | POA: Diagnosis not present

## 2014-12-22 DIAGNOSIS — C61 Malignant neoplasm of prostate: Secondary | ICD-10-CM | POA: Diagnosis not present

## 2014-12-23 ENCOUNTER — Ambulatory Visit
Admission: RE | Admit: 2014-12-23 | Discharge: 2014-12-23 | Disposition: A | Discharge status: Home / Self Care | Payer: Medicare Other | Source: Ambulatory Visit | Attending: Radiation Oncology | Admitting: Radiation Oncology

## 2014-12-23 DIAGNOSIS — Z510 Encounter for antineoplastic radiation therapy: Secondary | ICD-10-CM | POA: Diagnosis not present

## 2014-12-23 DIAGNOSIS — C61 Malignant neoplasm of prostate: Secondary | ICD-10-CM | POA: Diagnosis not present

## 2014-12-24 ENCOUNTER — Ambulatory Visit
Admission: RE | Admit: 2014-12-24 | Discharge: 2014-12-24 | Disposition: A | Discharge status: Home / Self Care | Payer: Medicare Other | Source: Ambulatory Visit | Attending: Radiation Oncology | Admitting: Radiation Oncology

## 2014-12-24 DIAGNOSIS — Z510 Encounter for antineoplastic radiation therapy: Secondary | ICD-10-CM | POA: Diagnosis not present

## 2014-12-24 DIAGNOSIS — C61 Malignant neoplasm of prostate: Secondary | ICD-10-CM | POA: Diagnosis not present

## 2014-12-25 ENCOUNTER — Ambulatory Visit
Admission: RE | Admit: 2014-12-25 | Discharge: 2014-12-25 | Disposition: A | Discharge status: Home / Self Care | Payer: Medicare Other | Source: Ambulatory Visit | Attending: Radiation Oncology | Admitting: Radiation Oncology

## 2014-12-25 ENCOUNTER — Encounter: Payer: Self-pay | Admitting: Radiation Oncology

## 2014-12-25 VITALS — BP 132/83 | HR 85 | Resp 16 | Wt 201.9 lb

## 2014-12-25 DIAGNOSIS — C61 Malignant neoplasm of prostate: Secondary | ICD-10-CM | POA: Diagnosis not present

## 2014-12-25 DIAGNOSIS — Z510 Encounter for antineoplastic radiation therapy: Secondary | ICD-10-CM | POA: Diagnosis not present

## 2014-12-25 NOTE — Progress Notes (Signed)
Weight and vitals stable. Reports dysuria is less with the aid of AZO. Denies hematuria. Reports nocturia x 3. Reports fatigue. Denies diarrhea. Reports rectal irritation continues. Reports he continues to use hydrocortisone and preparation H on affect area. Patient completes on Tuesday thus, one month follow up appointment card given.   BP 132/83 mmHg  Pulse 85  Resp 16  Wt 201 lb 14.4 oz (91.581 kg) Wt Readings from Last 3 Encounters:  12/25/14 201 lb 14.4 oz (91.581 kg)  12/18/14 200 lb 1.6 oz (90.765 kg)  12/10/14 198 lb 12.8 oz (90.175 kg)

## 2014-12-25 NOTE — Progress Notes (Signed)
  Radiation Oncology         [phone]   Name: Daniel Herrera MRN: [id number]   Date: 12/25/2014  DOB: 1940-10-27   Weekly Radiation Therapy Management    ICD-9-CM ICD-10-CM   1. Malignant neoplasm of prostate 185 C61    Malignant neoplasm of prostate  Current Dose: 71 Gy  Planned Dose:  75 Gy  Narrative The patient presents for routine under treatment assessment for today's radiation oncology appointment.  Reports dysuria is less with the aid of AZO. Denies hematuria and diarrhea. Reports nocturia x 3, rectal irritation continuing, and fatigue. Reports he continues to use hydrocortisone and preparation H on affected area. Patient completes on Tuesday, therefore a one month follow up appointment card was given.   The chart was checked. Set-up films were reviewed.  Physical Findings  weight is 201 lb 14.4 oz (91.581 kg). His blood pressure is 132/83 and his pulse is 85. His respiration is 16.  Weight essentially stable. There is no significant changes to the status of the paients overall health to be noted at this time.   Impression Daniel Herrera is a 74 year old gentleman presenting to clinic in regards to his malignant neoplasm of prostate. The patient is tolerating radiation. If the patients major complaint worsens or does not improve, moving forward, he may need to be evaluated for injury (upon placement of the catheter), schedule a follow-up appointment with neurology, or evaluated for a urinary track infection.  Plan The patient is advised to continue treatment as planned. All vocalized questions and concerns have been addressed. The patient has been advised of healthy methods of management in regards to his vocalized symptoms. The patient is aware of his follow-up appointment with radiation oncology to take place next week, as scheduled. If the patient develops any further questions or concerns in regards to his treatment and recovery, he has been encouraged to contact Dr. [name],  MD.    This document serves as a record of services personally performed by [name], MD. It was created on his behalf by [name], a trained medical scribe. The creation of this record is based on the scribe's personal observations and the [name]'s statements to them. This document has been checked and approved by the attending [name].  ____________________________________________       [name]. [name], M.D.

## 2014-12-28 ENCOUNTER — Ambulatory Visit
Admission: RE | Admit: 2014-12-28 | Discharge: 2014-12-28 | Disposition: A | Discharge status: Home / Self Care | Payer: Medicare Other | Source: Ambulatory Visit | Attending: Radiation Oncology | Admitting: Radiation Oncology

## 2014-12-28 DIAGNOSIS — C61 Malignant neoplasm of prostate: Secondary | ICD-10-CM | POA: Diagnosis not present

## 2014-12-28 DIAGNOSIS — Z510 Encounter for antineoplastic radiation therapy: Secondary | ICD-10-CM | POA: Diagnosis not present

## 2014-12-29 ENCOUNTER — Ambulatory Visit
Admission: RE | Admit: 2014-12-29 | Discharge: 2014-12-29 | Disposition: A | Discharge status: Home / Self Care | Payer: Medicare Other | Source: Ambulatory Visit | Attending: Radiation Oncology | Admitting: Radiation Oncology

## 2014-12-29 ENCOUNTER — Encounter: Payer: Self-pay | Admitting: Medical Oncology

## 2014-12-29 ENCOUNTER — Encounter: Payer: Self-pay | Admitting: Radiation Oncology

## 2014-12-29 DIAGNOSIS — Z510 Encounter for antineoplastic radiation therapy: Secondary | ICD-10-CM | POA: Diagnosis not present

## 2014-12-29 DIAGNOSIS — C61 Malignant neoplasm of prostate: Secondary | ICD-10-CM | POA: Diagnosis not present

## 2014-12-29 NOTE — Progress Notes (Signed)
Oncology Nurse Navigator Documentation  Oncology Nurse Navigator Flowsheets 12/08/2014 12/14/2014 12/29/2014  Referral date to RadOnc/MedOnc - - -  Navigator Encounter Type Treatment - Treatment  Patient Visit Type Radonc Radonc Radonc  Treatment Phase Treatment Treatment Final Radiation Tx- Daniel Herrera and his family celebrated the completion of his radiation treatments today with the ringing of the bell. He was so excited to be finished. He will follow up with Dr. [name] on 02/04/15. I asked him to call me with any questions or concerns. He voiced understanding.  Barriers/Navigation Needs No barriers at this time - No barriers at this time  Interventions Other Coordination of Care -  Coordination of Care - Other -  Support Groups/Services Friends and Family Friends and Family Friends and Family  Time Spent with Patient [phone]

## 2015-01-05 DIAGNOSIS — R531 Weakness: Secondary | ICD-10-CM | POA: Diagnosis not present

## 2015-01-12 DIAGNOSIS — D510 Vitamin B12 deficiency anemia due to intrinsic factor deficiency: Secondary | ICD-10-CM | POA: Diagnosis not present

## 2015-01-19 DIAGNOSIS — D510 Vitamin B12 deficiency anemia due to intrinsic factor deficiency: Secondary | ICD-10-CM | POA: Diagnosis not present

## 2015-01-26 DIAGNOSIS — D510 Vitamin B12 deficiency anemia due to intrinsic factor deficiency: Secondary | ICD-10-CM | POA: Diagnosis not present

## 2015-01-28 DIAGNOSIS — G5601 Carpal tunnel syndrome, right upper limb: Secondary | ICD-10-CM | POA: Diagnosis not present

## 2015-01-28 DIAGNOSIS — G5602 Carpal tunnel syndrome, left upper limb: Secondary | ICD-10-CM | POA: Diagnosis not present

## 2015-01-29 DIAGNOSIS — G5601 Carpal tunnel syndrome, right upper limb: Secondary | ICD-10-CM | POA: Diagnosis not present

## 2015-01-29 DIAGNOSIS — M65331 Trigger finger, right middle finger: Secondary | ICD-10-CM | POA: Diagnosis not present

## 2015-02-03 DIAGNOSIS — R739 Hyperglycemia, unspecified: Secondary | ICD-10-CM | POA: Diagnosis not present

## 2015-02-03 DIAGNOSIS — M1990 Unspecified osteoarthritis, unspecified site: Secondary | ICD-10-CM | POA: Diagnosis not present

## 2015-02-03 DIAGNOSIS — E785 Hyperlipidemia, unspecified: Secondary | ICD-10-CM | POA: Diagnosis not present

## 2015-02-03 DIAGNOSIS — E039 Hypothyroidism, unspecified: Secondary | ICD-10-CM | POA: Diagnosis not present

## 2015-02-03 DIAGNOSIS — R531 Weakness: Secondary | ICD-10-CM | POA: Diagnosis not present

## 2015-02-03 DIAGNOSIS — I4940 Unspecified premature depolarization: Secondary | ICD-10-CM | POA: Diagnosis not present

## 2015-02-03 DIAGNOSIS — G4700 Insomnia, unspecified: Secondary | ICD-10-CM | POA: Diagnosis not present

## 2015-02-03 DIAGNOSIS — D510 Vitamin B12 deficiency anemia due to intrinsic factor deficiency: Secondary | ICD-10-CM | POA: Diagnosis not present

## 2015-02-03 DIAGNOSIS — Z6827 Body mass index (BMI) 27.0-27.9, adult: Secondary | ICD-10-CM | POA: Diagnosis not present

## 2015-02-03 DIAGNOSIS — B370 Candidal stomatitis: Secondary | ICD-10-CM | POA: Diagnosis not present

## 2015-02-04 ENCOUNTER — Telehealth: Payer: Self-pay | Admitting: *Deleted

## 2015-02-04 ENCOUNTER — Ambulatory Visit
Admission: RE | Admit: 2015-02-04 | Discharge: 2015-02-04 | Disposition: A | Discharge status: Home / Self Care | Payer: Medicare Other | Source: Ambulatory Visit | Attending: Radiation Oncology | Admitting: Radiation Oncology

## 2015-02-04 ENCOUNTER — Encounter: Payer: Self-pay | Admitting: Radiation Oncology

## 2015-02-04 VITALS — BP 125/85 | HR 74 | Resp 16 | Wt 202.1 lb

## 2015-02-04 DIAGNOSIS — C61 Malignant neoplasm of prostate: Secondary | ICD-10-CM

## 2015-02-04 DIAGNOSIS — G5601 Carpal tunnel syndrome, right upper limb: Secondary | ICD-10-CM | POA: Diagnosis not present

## 2015-02-04 NOTE — Progress Notes (Signed)
Radiation Oncology         ([phone]) [phone] ________________________________  Name: Daniel Herrera MRN: [id number]  Date: 02/04/2015  DOB: [birth year]    Follow-Up Visit Note  CC: [name], NP  Raynelle Bring, MD  Diagnosis:    74 y.o. gentleman with stage T1c adenocarcinoma of the prostate with a Gleason's score of 4+4 and a PSA of 8.8 (on finasteride).    ICD-9-CM ICD-10-CM   1. Malignant neoplasm of prostate (HCC) 185 C61     Interval Since Last Radiation:  1  months  Narrative:  The patient returns today for routine follow-up.  Weight and vitals stable. Denies pain. Reports he had carpal tunnel surgery on his right hand last Friday. Reports his PCP determined he has an abnormal heart rate. Reports nocturia x 2. Denies dysuria, diarrhea, urgency or incontinence. Describes a strong steady urine stream. Continues to take Avodart. Reports diarrhea and rectal irritation have resolved. Reports his energy level is gradually improving. Currently, patient does not have a follow up appointment with his urologist.                              ALLERGIES:  is allergic to benadryl; darvocet; and darvon.  Meds: Current Outpatient Prescriptions  Medication Sig Dispense Refill  . clobetasol ointment (TEMOVATE) 0.05 % APPLY EXTERNALLLY AS DIRECTED  1  . dutasteride (AVODART) 0.5 MG capsule Take 1 capsule (0.5 mg total) by mouth every evening. 30 capsule 5  . fluticasone (VERAMYST) 27.5 MCG/SPRAY nasal spray Place 1 spray into the nose daily.    [name] levothyroxine (SYNTHROID, LEVOTHROID) 75 MCG tablet Take 75 mcg by mouth daily before breakfast.    . lisinopril (PRINIVIL,ZESTRIL) 10 MG tablet TK 1 T PO  QD  6  . LIVALO 4 MG TABS     . omeprazole (PRILOSEC) 20 MG capsule Take 20 mg by mouth daily.    . hydrocortisone (PROCTOSOL HC) 2.5 % rectal cream Place 1 application rectally 2 (two) times daily. (Patient not taking: Reported on 02/04/2015) 30 g 0  . nabumetone (RELAFEN) 750 MG tablet Take 750 mg by  mouth 2 (two) times daily.     No current facility-administered medications for this encounter.    Physical Findings: The patient is in no acute distress. Patient is alert and oriented.  weight is 202 lb 1.6 oz (91.672 kg). His blood pressure is 125/85 and his pulse is 74. His respiration is 16 and oxygen saturation is 100%. .  No significant changes.  Lab Findings: Lab Results  Component Value Date   WBC 5.8 12/17/2013   HGB 14.8 12/17/2013   HCT 43.8 12/17/2013   PLT 181.0 12/17/2013    Lab Results  Component Value Date   NA 142 12/17/2013   K 4.0 12/17/2013   CO2 27 12/17/2013   GLUCOSE 93 12/17/2013   BUN 23 12/17/2013   CREATININE 1.2 12/17/2013   BILITOT 1.0 12/17/2013   ALKPHOS 70 12/17/2013   AST 25 12/17/2013   ALT 23 12/17/2013   PROT 6.7 12/17/2013   ALBUMIN 4.2 12/17/2013   CALCIUM 9.2 12/17/2013    Radiographic Findings: No results found.  Impression:  The patient is recovering from the effects of radiation.  Plan:  He will continue to follow-up with urology for ongoing PSA determinations.  I will look forward to following his response through their correspondence, and be happy to participate in care if clinically indicated.  I  talked to the patient about what to expect in the future, including his risk for erectile dysfunction and rectal bleeding.  I encouraged him to call or return to the office if he has any question about his previous radiation or possible radiation effects.  He was comfortable with this plan.  _____________________________________  [name]. [name], M.D.   This document serves as a record of services personally performed by [name], MD. It was created on his behalf by Lendon Collar, a trained medical scribe. The creation of this record is based on the scribe's personal observations and the provider's statements to them. This document has been checked and approved by the attending provider.

## 2015-02-04 NOTE — Progress Notes (Addendum)
Weight and vitals stable. Denies pain. Reports he had carpal tunnel surgery on his right hand last Friday. Reports his PCP determined he has an abnormal heart rate. Reports nocturia x 2. Denies dysuria, diarrhea, urgency or incontinence. Describes a strong steady urine stream. Continues to take Avodart. Reports diarrhea and rectal irritation have resolved. Reports his energy level is gradually improving. Currently, patient does not have a follow up appointment with his urologist.   BP 125/85 mmHg  Pulse 74  Resp 16  Wt 202 lb 1.6 oz (91.672 kg)  SpO2 100% Wt Readings from Last 3 Encounters:  02/04/15 202 lb 1.6 oz (91.672 kg)  12/25/14 201 lb 14.4 oz (91.581 kg)  12/18/14 200 lb 1.6 oz (90.765 kg)

## 2015-02-04 NOTE — Telephone Encounter (Signed)
Called patient to inform of fu visit with Dr. Alinda Money on 03-03-15- arrival time - 1:30 pm, spoke with patient and he is aware of this appt.

## 2015-02-16 DIAGNOSIS — G5601 Carpal tunnel syndrome, right upper limb: Secondary | ICD-10-CM | POA: Diagnosis not present

## 2015-02-19 NOTE — Progress Notes (Signed)
  Radiation Oncology         [phone] ________________________________  Name: Daniel Herrera MRN: [id number]  Date: 12/29/2014  DOB: 1940/09/11  End of Treatment Note  Diagnosis:   ICD-9-CM ICD-10-CM    1. Malignant neoplasm of prostate 185 C61     DIAGNOSIS: 74 y.o. gentleman with stage T1c adenocarcinoma of the prostate with a Gleason's score of 4+4 and a PSA of 8.8 (on finasteride)     Indication for treatment:  Curative, Definitive Radiotherapy  Radiation treatment dates:  11/03/2014-12/29/2014  Site/dose:  1. The prostate, seminal vesicles, and pelvic lymph nodes were initially treated to 45 Gy in 25 fractions of 1.8 Gy  2. The prostate only was boosted to 75 Gy with 15 additional fractions of 2.0 Gy   Beams/energy:  1. The prostate, seminal vesicles, and pelvic lymph nodes were initially treated using VMAT radiotherapy delivering 6 megavolt photons. Image guidance was performed with CB- CT studies prior to each fraction. He was immobilized with a body fix lower extremity mold.  2. the prostate only was boosted using VMAT radiotherapy delivering 6 megavolt photons. Image guidance was performed with CB- CT studies prior to each fraction. He was immobilized with a body fix lower extremity mold.  Narrative: The patient tolerated radiation treatment relatively well.   The patient experienced some minor urinary irritation and modest fatigue.   Plan: The patient has completed radiation treatment. He will return to radiation oncology clinic for routine followup in one month. I advised him to call or return sooner if he has any questions or concerns related to his recovery or treatment. ________________________________  [name]. [name], M.D.   This document serves as a record of services personally performed by [name], MD. It was created on his behalf by Arlyce Harman, a trained medical scribe. The creation of this record is based on the scribe's personal  observations and the provider's statements to them. This document has been checked and approved by the attending provider.

## 2015-02-21 ENCOUNTER — Emergency Department (HOSPITAL_COMMUNITY): Payer: Medicare Other

## 2015-02-21 ENCOUNTER — Encounter (HOSPITAL_COMMUNITY)
Admission: EM | Disposition: A | Discharge status: Home / Self Care | Payer: Self-pay | Source: Home / Self Care | Attending: Cardiovascular Disease

## 2015-02-21 ENCOUNTER — Inpatient Hospital Stay (HOSPITAL_COMMUNITY)
Admission: EM | Admit: 2015-02-21 | Discharge: 2015-02-24 | DRG: 247 | Disposition: A | Discharge status: Home / Self Care | Payer: Medicare Other | Attending: Cardiovascular Disease | Admitting: Cardiovascular Disease

## 2015-02-21 ENCOUNTER — Encounter (HOSPITAL_COMMUNITY): Payer: Self-pay | Admitting: Emergency Medicine

## 2015-02-21 DIAGNOSIS — I213 ST elevation (STEMI) myocardial infarction of unspecified site: Secondary | ICD-10-CM

## 2015-02-21 DIAGNOSIS — I25119 Atherosclerotic heart disease of native coronary artery with unspecified angina pectoris: Secondary | ICD-10-CM

## 2015-02-21 DIAGNOSIS — I252 Old myocardial infarction: Secondary | ICD-10-CM | POA: Diagnosis present

## 2015-02-21 DIAGNOSIS — E785 Hyperlipidemia, unspecified: Secondary | ICD-10-CM

## 2015-02-21 DIAGNOSIS — Z85828 Personal history of other malignant neoplasm of skin: Secondary | ICD-10-CM | POA: Diagnosis not present

## 2015-02-21 DIAGNOSIS — E039 Hypothyroidism, unspecified: Secondary | ICD-10-CM | POA: Diagnosis present

## 2015-02-21 DIAGNOSIS — R9431 Abnormal electrocardiogram [ECG] [EKG]: Secondary | ICD-10-CM

## 2015-02-21 DIAGNOSIS — I214 Non-ST elevation (NSTEMI) myocardial infarction: Principal | ICD-10-CM | POA: Diagnosis present

## 2015-02-21 DIAGNOSIS — R0602 Shortness of breath: Secondary | ICD-10-CM | POA: Diagnosis not present

## 2015-02-21 DIAGNOSIS — I10 Essential (primary) hypertension: Secondary | ICD-10-CM

## 2015-02-21 DIAGNOSIS — I209 Angina pectoris, unspecified: Secondary | ICD-10-CM

## 2015-02-21 DIAGNOSIS — R079 Chest pain, unspecified: Secondary | ICD-10-CM | POA: Diagnosis not present

## 2015-02-21 DIAGNOSIS — Z8546 Personal history of malignant neoplasm of prostate: Secondary | ICD-10-CM | POA: Diagnosis not present

## 2015-02-21 DIAGNOSIS — Z955 Presence of coronary angioplasty implant and graft: Secondary | ICD-10-CM

## 2015-02-21 HISTORY — PX: CARDIAC CATHETERIZATION: SHX172

## 2015-02-21 HISTORY — DX: Essential (primary) hypertension: I10

## 2015-02-21 HISTORY — DX: Abnormal electrocardiogram (ECG) (EKG): R94.31

## 2015-02-21 HISTORY — DX: Atherosclerotic heart disease of native coronary artery without angina pectoris: I25.10

## 2015-02-21 HISTORY — DX: Hyperlipidemia, unspecified: E78.5

## 2015-02-21 LAB — CBC
HEMATOCRIT: 40 % (ref 39.0–52.0)
HEMOGLOBIN: 13.6 g/dL (ref 13.0–17.0)
MCH: 33.4 pg (ref 26.0–34.0)
MCHC: 34 g/dL (ref 30.0–36.0)
MCV: 98.3 fL (ref 78.0–100.0)
Platelets: 139 10*3/uL — ABNORMAL LOW (ref 150–400)
RBC: 4.07 MIL/uL — AB (ref 4.22–5.81)
RDW: 12.4 % (ref 11.5–15.5)
WBC: 3 10*3/uL — AB (ref 4.0–10.5)

## 2015-02-21 LAB — BASIC METABOLIC PANEL
Anion gap: 8 (ref 5–15)
BUN: 21 mg/dL — ABNORMAL HIGH (ref 6–20)
CO2: 24 mmol/L (ref 22–32)
Calcium: 9.2 mg/dL (ref 8.9–10.3)
Chloride: 110 mmol/L (ref 101–111)
Creatinine, Ser: 1.27 mg/dL — ABNORMAL HIGH (ref 0.61–1.24)
GFR calc Af Amer: >60 mL/min (ref >60)
GFR calc non Af Amer: 53 mL/min — ABNORMAL LOW (ref >60)
Glucose, Bld: 186 mg/dL — ABNORMAL HIGH (ref 65–99)
Potassium: 4 mmol/L (ref 3.5–5.1)
Sodium: 142 mmol/L (ref 135–145)

## 2015-02-21 LAB — I-STAT TROPONIN, ED: Troponin i, poc: 0.71 ng/mL (ref 0.00–0.08)

## 2015-02-21 SURGERY — LEFT HEART CATH
Anesthesia: General

## 2015-02-21 MED ORDER — HEPARIN SODIUM (PORCINE) 5000 UNIT/ML IJ SOLN
INTRAMUSCULAR | Status: AC
  Filled 2015-02-21: qty 1

## 2015-02-21 MED ORDER — NITROGLYCERIN 0.4 MG SL SUBL
0.4000 mg | SUBLINGUAL_TABLET | SUBLINGUAL | Status: AC | PRN
  Administered 2015-02-21 (×3): 0.4 mg via SUBLINGUAL
  Filled 2015-02-21: qty 1

## 2015-02-21 MED ORDER — ASPIRIN 325 MG PO TABS
325.0000 mg | ORAL_TABLET | Freq: Once | ORAL | Status: AC
Start: 2015-02-21 — End: 2015-02-21
  Administered 2015-02-21: 325 mg via ORAL
  Filled 2015-02-21: qty 1

## 2015-02-21 MED ORDER — BIVALIRUDIN 250 MG IV SOLR
INTRAVENOUS | Status: AC
  Filled 2015-02-21: qty 250

## 2015-02-21 MED ORDER — NITROGLYCERIN 1 MG/10 ML FOR IR/CATH LAB
INTRA_ARTERIAL | Status: AC
  Filled 2015-02-21: qty 10

## 2015-02-21 MED ORDER — BIVALIRUDIN BOLUS VIA INFUSION - CUPID
INTRAVENOUS | Status: DC | PRN
  Administered 2015-02-21: 68.7 mg via INTRAVENOUS

## 2015-02-21 MED ORDER — SODIUM CHLORIDE 0.9 % IV SOLN
50000.0000 ug | INTRAVENOUS | Status: DC | PRN
  Administered 2015-02-21: 4 ug/kg/min via INTRAVENOUS

## 2015-02-21 MED ORDER — LIDOCAINE HCL (PF) 1 % IJ SOLN
INTRAMUSCULAR | Status: AC
  Filled 2015-02-21: qty 30

## 2015-02-21 MED ORDER — HEPARIN SODIUM (PORCINE) 5000 UNIT/ML IJ SOLN
4000.0000 [IU] | Freq: Once | INTRAMUSCULAR | Status: AC
  Administered 2015-02-21: 4000 [IU] via INTRAVENOUS

## 2015-02-21 MED ORDER — HEPARIN (PORCINE) IN NACL 100-0.45 UNIT/ML-% IJ SOLN
1250.0000 [IU]/h | INTRAMUSCULAR | Status: DC
  Administered 2015-02-21: 1250 [IU]/h via INTRAVENOUS
  Filled 2015-02-21: qty 250

## 2015-02-21 MED ORDER — NITROGLYCERIN IN D5W 200-5 MCG/ML-% IV SOLN
10.0000 ug/min | INTRAVENOUS | Status: DC
  Administered 2015-02-21: 10 ug/min via INTRAVENOUS
  Filled 2015-02-21: qty 250

## 2015-02-21 MED ORDER — CANGRELOR BOLUS VIA INFUSION
INTRAVENOUS | Status: DC | PRN
  Administered 2015-02-21: 2748 ug via INTRAVENOUS

## 2015-02-21 MED ORDER — HEPARIN (PORCINE) IN NACL 2-0.9 UNIT/ML-% IJ SOLN
INTRAMUSCULAR | Status: AC
  Filled 2015-02-21: qty 1500

## 2015-02-21 MED ORDER — CANGRELOR TETRASODIUM 50 MG IV SOLR
INTRAVENOUS | Status: AC
  Filled 2015-02-21: qty 50

## 2015-02-21 SURGICAL SUPPLY — 17 items
BALLN EMERGE MR 2.0X12 (BALLOONS) ×3
BALLN ~~LOC~~ TREK RX 3.25X15 (BALLOONS) ×3 IMPLANT
BALLOON EMERGE MR 2.0X12 (BALLOONS) ×1 IMPLANT
CATH INFINITI 5FR MULTPACK ANG (CATHETERS) ×3 IMPLANT
CATH VISTA GUIDE 6FR XBLAD3.5 (CATHETERS) ×3 IMPLANT
CATH VISTA GUIDE 6FR XBLAD4 (CATHETERS) ×3 IMPLANT
KIT ENCORE 26 ADVANTAGE (KITS) ×3 IMPLANT
KIT HEART LEFT (KITS) ×3 IMPLANT
PACK CARDIAC CATHETERIZATION (CUSTOM PROCEDURE TRAY) ×3 IMPLANT
SHEATH PINNACLE 6F 10CM (SHEATH) ×3 IMPLANT
STENT SYNERGY DES 2.5X12 (Permanent Stent) ×3 IMPLANT
STENT SYNERGY DES 3X28 (Permanent Stent) ×3 IMPLANT
SYR MEDRAD MARK V 150ML (SYRINGE) ×3 IMPLANT
TRANSDUCER W/STOPCOCK (MISCELLANEOUS) ×3 IMPLANT
TUBING CIL FLEX 10 FLL-RA (TUBING) ×3 IMPLANT
WIRE ASAHI PROWATER 180CM (WIRE) ×3 IMPLANT
WIRE EMERALD 3MM-J .035X150CM (WIRE) ×3 IMPLANT

## 2015-02-21 NOTE — ED Notes (Signed)
This RN called pharmacy to send Heparin and was told they are walking it up now.

## 2015-02-21 NOTE — ED Notes (Signed)
Pt laying comfortably on ED stretcher with two family members at bedside and pt is being monitored by BP cuff, continuous pulse oximetry, 5 lead and zoll.

## 2015-02-21 NOTE — ED Provider Notes (Addendum)
CSN: [id number]     Arrival date & time 02/21/15  2155 History   First MD Initiated Contact with Patient 02/21/15 2206     Chief Complaint  Patient presents with  . Chest Pain     (Consider location/radiation/quality/duration/timing/severity/associated sxs/prior Treatment) The history is provided by the patient.  Daniel Herrera is a 74 y.o. male hx of HTN, HL, prostate cancer here with chest pain, shortness of breath. Patient states that is intermittent chest pain for the last 2 weeks. Patient has worsening pain across his left side is chest around an hour and a half ago and that is constant and not resolving. Patient states that he feels short of breath from the pain. Did not take anything for pain. No hx of CAD.    Past Medical History  Diagnosis Date  . Thyroid disease   . Prostate enlargement   . GERD (gastroesophageal reflux disease)   . High cholesterol   . Prostate cancer ([place])   . Anxiety   . Arthritis   . History of prolonged Q-T interval on ECG   . Personal history of digestive disease     gastric ulcer  . Gout   . Hx of Clostridium difficile infection   . Hx of umbilical hernia repair   . Skin cancer     basal cell carcinoma   Past Surgical History  Procedure Laterality Date  . Back surgery    . Nasal sinus surgery    . Appendectomy    . Prostate biopsy    . Cataract extraction, bilateral    . Right shoulder rotator cuff repair     Family History  Problem Relation Age of Onset  . Cancer Mother   . Cancer Father     prostate  . Cancer Sister    Social History  Substance Use Topics  . Smoking status: Never Smoker   . Smokeless tobacco: Never Used  . Alcohol Use: No    Review of Systems  Cardiovascular: Positive for chest pain.  All other systems reviewed and are negative.     Allergies  Benadryl; Darvocet; and Darvon  Home Medications   Prior to Admission medications   Medication Sig Start Date End Date Taking? Authorizing Provider   clobetasol ointment (TEMOVATE) 0.05 % APPLY EXTERNALLLY AS DIRECTED 10/27/14   Historical Provider, MD  dutasteride (AVODART) 0.5 MG capsule Take 1 capsule (0.5 mg total) by mouth every evening. 11/26/14   [name], MD  fluticasone (VERAMYST) 27.5 MCG/SPRAY nasal spray Place 1 spray into the nose daily.    Historical Provider, MD  hydrocortisone (PROCTOSOL HC) 2.5 % rectal cream Place 1 application rectally 2 (two) times daily. Patient not taking: Reported on 02/04/2015 12/04/14   [name], MD  levothyroxine (SYNTHROID, LEVOTHROID) 75 MCG tablet Take 75 mcg by mouth daily before breakfast.    Historical Provider, MD  lisinopril (PRINIVIL,ZESTRIL) 10 MG tablet TK 1 T PO  QD 09/17/14   Historical Provider, MD  LIVALO 4 MG TABS  11/05/14   Historical Provider, MD  nabumetone (RELAFEN) 750 MG tablet Take 750 mg by mouth 2 (two) times daily.    Historical Provider, MD  omeprazole (PRILOSEC) 20 MG capsule Take 20 mg by mouth daily.    Historical Provider, MD   BP 138/93 mmHg  Pulse 76  Temp(Src) 97.8 F (36.6 C) (Oral)  Resp 18  Ht 6\' 1"  (1.854 m)  Wt 202 lb (91.627 kg)  BMI 26.66 kg/m2  SpO2 98% Physical  Exam  Constitutional: He is oriented to person, place, and time.  Uncomfortable   HENT:  Head: Normocephalic.  Mouth/Throat: Oropharynx is clear and moist.  Eyes: Conjunctivae are normal. Pupils are equal, round, and reactive to light.  Neck: Normal range of motion. Neck supple.  Cardiovascular: Normal rate, regular rhythm and normal heart sounds.   Pulmonary/Chest: Effort normal and breath sounds normal. No respiratory distress. He has no wheezes. He has no rales. He exhibits no tenderness.  Abdominal: Soft. Bowel sounds are normal. He exhibits no distension. There is no tenderness. There is no rebound.  Musculoskeletal: Normal range of motion. He exhibits no edema or tenderness.  Neurological: He is alert and oriented to person, place, and time. No cranial nerve deficit.  Coordination normal.  Skin: Skin is warm and dry.  Psychiatric: He has a normal mood and affect. His behavior is normal. Judgment and thought content normal.  Nursing note and vitals reviewed.   ED Course  Procedures (including critical care time)  CRITICAL CARE Performed by: Darl Householder, DAVID   Total critical care time: 30 minutes  Critical care time was exclusive of separately billable procedures and treating other patients.  Critical care was necessary to treat or prevent imminent or life-threatening deterioration.  Critical care was time spent personally by me on the following activities: development of treatment plan with patient and/or surrogate as well as nursing, discussions with consultants, evaluation of patient's response to treatment, examination of patient, obtaining history from patient or surrogate, ordering and performing treatments and interventions, ordering and review of laboratory studies, ordering and review of radiographic studies, pulse oximetry and re-evaluation of patient's condition.   Labs Review Labs Reviewed  BASIC METABOLIC PANEL  CBC  I-STAT [place], ED    Imaging Review No results found. I have personally reviewed and evaluated these images and lab results as part of my medical decision-making.   EKG Interpretation   Date/Time:  Sunday February 21 2015 22:04:18 EST Ventricular Rate:  76 PR Interval:  194 QRS Duration: 82 QT Interval:  454 QTC Calculation: 510 R Axis:   -4 Text Interpretation:  Normal sinus rhythm ST \\T \ T wave abnormality,  consider anterolateral ischemia Prolonged QT Abnormal ECG TWI new since  previous  Confirmed by YAO  MD, DAVID ([id number]) on 02/21/2015 10:07:52 PM      MDM   Final diagnoses:  None   Daniel Herrera is a 74 y.o. male here with chest pain. Pain constant for the last hour or so. Initial EKG showed deep TWI V2, V3 that is new. Concerned for unstable angina vs STEMI.   10:47 PM Multiple repeat EKGs  now has more ST depressions V3, V4. Posterior lead showed STEMI V8. STEMI activated at 22:28. Ordered heparin, nitro drip. Consulted Dr. Gwenlyn Found, who recommend consult cardiology fellow. Dr. Eula Fried, who is at bedside. He discussed with Dr. Gwenlyn Found. Felt that he has likely anterior MI and will go emergently to cath. Trop 0.71 inittially     Wandra Arthurs, MD 02/21/15 2257  Wandra Arthurs, MD 02/22/15 [phone]

## 2015-02-21 NOTE — ED Notes (Signed)
Pt. reports intermittent pain across his chest onset last month worse today with mild SOB and emesis , denies diaphoresis .

## 2015-02-21 NOTE — ED Notes (Signed)
Dr. Qureshi at bedside.  

## 2015-02-21 NOTE — Progress Notes (Addendum)
ANTICOAGULATION CONSULT NOTE - Initial Consult  Pharmacy Consult for Heparin Indication: chest pain/ACS  Allergies  Allergen Reactions  . Benadryl [Diphenhydramine Hcl] Swelling  . Darvocet [Propoxyphene N-Acetaminophen]     hallucination  . Darvon [Propoxyphene Hcl]     Hallucination     Patient Measurements: Height: 6\' 1"  (185.4 cm) Weight: 202 lb (91.627 kg) IBW/kg (Calculated) : 79.9 Heparin Dosing Weight: 91 kg  Vital Signs: Temp: 97.8 F (36.6 C) (11/20 2205) Temp Source: Oral (11/20 2205) BP: 138/93 mmHg (11/20 2205) Pulse Rate: 76 (11/20 2205)  Labs: No results for input(s): HGB, HCT, PLT, APTT, LABPROT, INR, HEPARINUNFRC, CREATININE, CKTOTAL, CKMB, TROPONINI in the last 72 hours.  CrCl cannot be calculated (Patient has no serum creatinine result on file.).   Medical History: Past Medical History  Diagnosis Date  . Thyroid disease   . Prostate enlargement   . GERD (gastroesophageal reflux disease)   . High cholesterol   . Prostate cancer ([place])   . Anxiety   . Arthritis   . History of prolonged Q-T interval on ECG   . Personal history of digestive disease     gastric ulcer  . Gout   . Hx of Clostridium difficile infection   . Hx of umbilical hernia repair   . Skin cancer     basal cell carcinoma    Medications:  See electronic med rec  Assessment: 74 y.o. M presents with CP.  To begin heparin for r/o ACS. Now CODE STEMI called - being transferred to cath lab.  Goal of Therapy:  Heparin level 0.3-0.7 units/ml Monitor platelets by anticoagulation protocol: Yes   Plan:  Heparin IV bolus 4000 units Heparin gtt at 1250 units/hr Will f/u heparin level in 8 hours or f/u post cath Daily heparin level and CBC  Sherlon Handing, PharmD, BCPS Clinical pharmacist, pager [phone] 02/21/2015,10:29 PM

## 2015-02-21 NOTE — ED Notes (Signed)
Pt being transferred to cath lab now.

## 2015-02-21 NOTE — H&P (Signed)
CARDIOLOGY INPATIENT HISTORY AND PHYSICAL EXAMINATION NOTE  Patient ID: Daniel Herrera MRN: [id number], DOB/AGE: 1940/09/21   Admit date: 02/21/2015   Primary Physician: [name], NP Primary Cardiologist: none  Reason for admission: chest pain  HPI: This is a 74 y.o.Caucasian male with no known history of CAD but has history of hypertension, hyperlipidemia, and prostate cancer on treatment, recently completed radiotherapy who presented with chest pain.  Patient has been having episodes of chest pain for the last one month with more episodes of intermittent chest pain today in the morning. He developed more of chest pain starting at 8 PM when his daughter brought him to the ED. In the ED, he received 2 x tabs of NTG with some improvement of chest pain however he continued to have chest pain. His initial EKG showed T wave inversions in the V1 - V3. Cath lab was activated at that time. His initial troponin was elevated to 0.71.   Problem List: Past Medical History  Diagnosis Date  . Thyroid disease   . Prostate enlargement   . GERD (gastroesophageal reflux disease)   . High cholesterol   . Prostate cancer ([place])   . Anxiety   . Arthritis   . History of prolonged Q-T interval on ECG   . Personal history of digestive disease     gastric ulcer  . Gout   . Hx of Clostridium difficile infection   . Hx of umbilical hernia repair   . Skin cancer     basal cell carcinoma    Past Surgical History  Procedure Laterality Date  . Back surgery    . Nasal sinus surgery    . Appendectomy    . Prostate biopsy    . Cataract extraction, bilateral    . Right shoulder rotator cuff repair       Allergies:  Allergies  Allergen Reactions  . Benadryl [Diphenhydramine Hcl] Swelling  . Darvocet [Propoxyphene N-Acetaminophen]     hallucination  . Darvon [Propoxyphene Hcl]     Hallucination      Home Medications Current Facility-Administered Medications  Medication Dose Route Frequency  Provider Last Rate Last Dose  . heparin ADULT infusion 100 units/mL (25000 units/250 mL)  1,250 Units/hr Intravenous Continuous Franky Macho, RPH 12.5 mL/hr at 02/21/15 2241 1,250 Units/hr at 02/21/15 2241  . [MAR Hold] nitroGLYCERIN 50 mg in dextrose 5 % 250 mL (0.2 mg/mL) infusion  10-200 mcg/min Intravenous Titrated Wandra Arthurs, MD 3 mL/hr at 02/21/15 2251 10 mcg/min at 02/21/15 2251     Family History  Problem Relation Age of Onset  . Cancer Mother   . Cancer Father     prostate  . Cancer Sister      Social History   Social History  . Marital Status: Married    Spouse Name: N/A  . Number of Children: N/A  . Years of Education: N/A   Occupational History  . retired    Social History Main Topics  . Smoking status: Never Smoker   . Smokeless tobacco: Never Used  . Alcohol Use: No  . Drug Use: No  . Sexual Activity: Not on file   Other Topics Concern  . Not on file   Social History Narrative     Review of Systems: General: negative for chills, fever, night sweats or weight changes.  Cardiovascular: chest pain, negative for dyspnea on exertion, edema, orthopnea, palpitations, paroxysmal nocturnal dyspnea or shortness of breath  Dermatological: negative for rash Respiratory: negative for  cough or wheezing Urologic: negative for hematuria Abdominal: negative for nausea, vomiting, diarrhea, bright red blood per rectum, melena, or hematemesis Neurologic: negative for visual changes, syncope, or dizziness Endocrine: no diabetes, no hypothyroidism Immunological: no lymph adenopathy Psych: non homicidal/suicidal  Physical Exam: Vitals: BP 122/82 mmHg  Pulse 85  Temp(Src) 97.8 F (36.6 C) (Oral)  Resp 13  Ht 6\' 1"  (1.854 m)  Wt 91.627 kg (202 lb)  BMI 26.66 kg/m2  SpO2 98% General: not in acute distress Neck: JVP flat, neck supple Heart: regular rate and rhythm, S1, S2, no murmurs  Lungs: CTAB  GI: non tender, non distended, bowel sounds present Extremities:  no edema Neuro: AAO x 3  Psych: normal affect, no anxiety   Labs:   Results for orders placed or performed during the hospital encounter of 02/21/15 (from the past 24 hour(s))  Basic metabolic panel     Status: Abnormal   Collection Time: 02/21/15 10:32 PM  Result Value Ref Range   Sodium 142 135 - 145 mmol/L   Potassium 4.0 3.5 - 5.1 mmol/L   Chloride 110 101 - 111 mmol/L   CO2 24 22 - 32 mmol/L   Glucose, Bld 186 (H) 65 - 99 mg/dL   BUN 21 (H) 6 - 20 mg/dL   Creatinine, Ser 1.27 (H) 0.61 - 1.24 mg/dL   Calcium 9.2 8.9 - 10.3 mg/dL   GFR calc non Af Amer 53 (L) >60 mL/min   GFR calc Af Amer >60 >60 mL/min   Anion gap 8 5 - 15  CBC     Status: Abnormal   Collection Time: 02/21/15 10:32 PM  Result Value Ref Range   WBC 3.0 (L) 4.0 - 10.5 K/uL   RBC 4.07 (L) 4.22 - 5.81 MIL/uL   Hemoglobin 13.6 13.0 - 17.0 g/dL   HCT 40.0 39.0 - 52.0 %   MCV 98.3 78.0 - 100.0 fL   MCH 33.4 26.0 - 34.0 pg   MCHC 34.0 30.0 - 36.0 g/dL   RDW 12.4 11.5 - 15.5 %   Platelets 139 (L) 150 - 400 K/uL  I-stat troponin, ED     Status: Abnormal   Collection Time: 02/21/15 10:38 PM  Result Value Ref Range   Troponin i, poc 0.71 (HH) 0.00 - 0.08 ng/mL   Comment NOTIFIED PHYSICIAN    Comment 3             Radiology/Studies: Dg Chest Port 1 View  02/21/2015  CLINICAL DATA:  Intermittent chest pain onset last month but worse today. Shortness of breath and emesis. EXAM: PORTABLE CHEST 1 VIEW COMPARISON:  08/12/2014 FINDINGS: Emphysematous changes in the lungs. Normal heart size and pulmonary vascularity. No focal airspace disease or consolidation in the lungs. No blunting of costophrenic angles. No pneumothorax. Mediastinal contours appear intact. IMPRESSION: Emphysematous changes in the lungs. No evidence of active pulmonary disease. Electronically Signed   By: Lucienne Capers M.D.   On: 02/21/2015 22:39    EKG: normal sinus rhythm, T wave inversion  Echo: pending  Cardiac cath: pending  Medical  decision making:  Discussed care with the patient Discussed care with the physician on the phone Reviewed labs and imaging personally Reviewed prior records  ASSESSMENT AND PLAN:  This is a 74 y.o. male with no known history of CAD but has history of hypertension, hyperlipidemia, and prostate cancer on treatment, recently completed radiotherapy who presented with chest pain.   Active Problems:   NSTEMI (non-ST elevated myocardial infarction) ([place])  Abnormal EKG   Essential hypertension   Hyperlipidemia  NSTEMI with non specific ST changes and deep wellen's T wave inversions 1. Emergent cardiac catheterization and PCI 2. Risk stratify with HbA1c, lipid profile, TSH 3. Echocardiogram in the morning 4. Aspirin, antiplatelet, high dose statin and beta blocker if tolerated 5. Medication compliance emphasized to the patient  Hyperlipidemia Lipid profile, high dose statin   Hypertension, essential Beta blocker + home meds  Signed, Flossie Dibble, MD MS 02/21/2015, 11:24 PM

## 2015-02-21 NOTE — ED Notes (Signed)
[facility] contacted to page Code Stemi

## 2015-02-22 ENCOUNTER — Encounter (HOSPITAL_COMMUNITY): Payer: Self-pay | Admitting: Cardiovascular Disease

## 2015-02-22 DIAGNOSIS — I252 Old myocardial infarction: Secondary | ICD-10-CM

## 2015-02-22 DIAGNOSIS — E785 Hyperlipidemia, unspecified: Secondary | ICD-10-CM | POA: Diagnosis not present

## 2015-02-22 DIAGNOSIS — Z8546 Personal history of malignant neoplasm of prostate: Secondary | ICD-10-CM | POA: Diagnosis not present

## 2015-02-22 DIAGNOSIS — R079 Chest pain, unspecified: Secondary | ICD-10-CM | POA: Diagnosis not present

## 2015-02-22 DIAGNOSIS — Z85828 Personal history of other malignant neoplasm of skin: Secondary | ICD-10-CM | POA: Diagnosis not present

## 2015-02-22 DIAGNOSIS — I10 Essential (primary) hypertension: Secondary | ICD-10-CM | POA: Diagnosis not present

## 2015-02-22 DIAGNOSIS — I214 Non-ST elevation (NSTEMI) myocardial infarction: Secondary | ICD-10-CM | POA: Diagnosis not present

## 2015-02-22 DIAGNOSIS — E039 Hypothyroidism, unspecified: Secondary | ICD-10-CM | POA: Diagnosis not present

## 2015-02-22 HISTORY — DX: Old myocardial infarction: I25.2

## 2015-02-22 LAB — CBC
HEMATOCRIT: 35.9 % — AB (ref 39.0–52.0)
Hemoglobin: 12.3 g/dL — ABNORMAL LOW (ref 13.0–17.0)
MCH: 33.2 pg (ref 26.0–34.0)
MCHC: 34.3 g/dL (ref 30.0–36.0)
MCV: 97 fL (ref 78.0–100.0)
Platelets: 121 10*3/uL — ABNORMAL LOW (ref 150–400)
RBC: 3.7 MIL/uL — ABNORMAL LOW (ref 4.22–5.81)
RDW: 12.5 % (ref 11.5–15.5)
WBC: 3.8 10*3/uL — AB (ref 4.0–10.5)

## 2015-02-22 LAB — TROPONIN I
TROPONIN I: 10.74 ng/mL — AB (ref <0.031)
Troponin I: 6.71 ng/mL (ref <0.031)

## 2015-02-22 LAB — MRSA PCR SCREENING: MRSA by PCR: NEGATIVE

## 2015-02-22 LAB — BASIC METABOLIC PANEL
Anion gap: 6 (ref 5–15)
BUN: 17 mg/dL (ref 6–20)
CALCIUM: 8.7 mg/dL — AB (ref 8.9–10.3)
CO2: 22 mmol/L (ref 22–32)
CREATININE: 1.03 mg/dL (ref 0.61–1.24)
Chloride: 112 mmol/L — ABNORMAL HIGH (ref 101–111)
GFR calc non Af Amer: >60 mL/min (ref >60)
Glucose, Bld: 128 mg/dL — ABNORMAL HIGH (ref 65–99)
Potassium: 3.9 mmol/L (ref 3.5–5.1)
Sodium: 140 mmol/L (ref 135–145)

## 2015-02-22 LAB — POCT ACTIVATED CLOTTING TIME: Activated Clotting Time: 460 seconds

## 2015-02-22 MED ORDER — IOHEXOL 350 MG/ML SOLN
INTRAVENOUS | Status: DC | PRN
  Administered 2015-02-22: 225 mL via INTRA_ARTERIAL

## 2015-02-22 MED ORDER — PANTOPRAZOLE SODIUM 40 MG PO TBEC
80.0000 mg | DELAYED_RELEASE_TABLET | Freq: Every day | ORAL | Status: DC
  Administered 2015-02-22 – 2015-02-24 (×3): 80 mg via ORAL
  Filled 2015-02-22 (×3): qty 2

## 2015-02-22 MED ORDER — LISINOPRIL 10 MG PO TABS
10.0000 mg | ORAL_TABLET | Freq: Every day | ORAL | Status: DC
  Administered 2015-02-22 – 2015-02-24 (×3): 10 mg via ORAL
  Filled 2015-02-22 (×3): qty 1

## 2015-02-22 MED ORDER — ACETAMINOPHEN 325 MG PO TABS
650.0000 mg | ORAL_TABLET | ORAL | Status: DC | PRN
  Administered 2015-02-22 (×2): 650 mg via ORAL
  Filled 2015-02-22 (×2): qty 2

## 2015-02-22 MED ORDER — ATORVASTATIN CALCIUM 80 MG PO TABS: 80.0000 mg | ORAL_TABLET | Freq: Every day | ORAL | Status: DC

## 2015-02-22 MED ORDER — TICAGRELOR 90 MG PO TABS
ORAL_TABLET | ORAL | Status: AC
  Filled 2015-02-22: qty 2

## 2015-02-22 MED ORDER — SODIUM CHLORIDE 0.9 % IV SOLN
250.0000 mg | INTRAVENOUS | Status: DC | PRN
  Administered 2015-02-21 – 2015-02-22 (×2): 1.75 mg/kg/h via INTRAVENOUS

## 2015-02-22 MED ORDER — HYDROCORTISONE 2.5 % RE CREA
1.0000 "application " | TOPICAL_CREAM | Freq: Two times a day (BID) | RECTAL | Status: DC
  Filled 2015-02-22: qty 28.35

## 2015-02-22 MED ORDER — SODIUM CHLORIDE 0.9 % IJ SOLN: 3.0000 mL | INTRAMUSCULAR | Status: DC | PRN

## 2015-02-22 MED ORDER — FLUTICASONE PROPIONATE 50 MCG/ACT NA SUSP
1.0000 | Freq: Every day | NASAL | Status: DC
  Administered 2015-02-22 – 2015-02-24 (×4): 1 via NASAL
  Filled 2015-02-22: qty 16

## 2015-02-22 MED ORDER — NABUMETONE 750 MG PO TABS
750.0000 mg | ORAL_TABLET | Freq: Two times a day (BID) | ORAL | Status: DC
  Filled 2015-02-22 (×2): qty 1

## 2015-02-22 MED ORDER — HYDRALAZINE HCL 20 MG/ML IJ SOLN
10.0000 mg | INTRAMUSCULAR | Status: DC | PRN
  Administered 2015-02-22 (×2): 10 mg via INTRAVENOUS
  Filled 2015-02-22 (×2): qty 1

## 2015-02-22 MED ORDER — MORPHINE SULFATE (PF) 2 MG/ML IV SOLN: 2.0000 mg | INTRAVENOUS | Status: DC | PRN

## 2015-02-22 MED ORDER — SODIUM CHLORIDE 0.9 % WEIGHT BASED INFUSION
3.0000 mL/kg/h | INTRAVENOUS | Status: AC
  Administered 2015-02-22: 3 mL/kg/h via INTRAVENOUS

## 2015-02-22 MED ORDER — TICAGRELOR 90 MG PO TABS
90.0000 mg | ORAL_TABLET | Freq: Two times a day (BID) | ORAL | Status: DC
  Administered 2015-02-22 – 2015-02-24 (×5): 90 mg via ORAL
  Filled 2015-02-22 (×5): qty 1

## 2015-02-22 MED ORDER — NITROGLYCERIN 1 MG/10 ML FOR IR/CATH LAB
INTRA_ARTERIAL | Status: DC | PRN
  Administered 2015-02-22: 01:00:00

## 2015-02-22 MED ORDER — LEVOTHYROXINE SODIUM 75 MCG PO TABS
75.0000 ug | ORAL_TABLET | Freq: Every day | ORAL | Status: DC
  Administered 2015-02-22 – 2015-02-24 (×3): 75 ug via ORAL
  Filled 2015-02-22 (×3): qty 1

## 2015-02-22 MED ORDER — ASPIRIN 81 MG PO CHEW
81.0000 mg | CHEWABLE_TABLET | Freq: Every day | ORAL | Status: DC
  Administered 2015-02-22 – 2015-02-24 (×3): 81 mg via ORAL
  Filled 2015-02-22 (×3): qty 1

## 2015-02-22 MED ORDER — SODIUM CHLORIDE 0.9 % IV SOLN
1.7500 mg/kg/h | INTRAVENOUS | Status: AC
  Administered 2015-02-22 (×2): 1.75 mg/kg/h via INTRAVENOUS
  Filled 2015-02-22 (×4): qty 250

## 2015-02-22 MED ORDER — ATORVASTATIN CALCIUM 80 MG PO TABS
80.0000 mg | ORAL_TABLET | Freq: Every day | ORAL | Status: DC
  Administered 2015-02-22 – 2015-02-23 (×2): 80 mg via ORAL
  Filled 2015-02-22 (×2): qty 1

## 2015-02-22 MED ORDER — SODIUM CHLORIDE 0.9 % IJ SOLN
3.0000 mL | Freq: Two times a day (BID) | INTRAMUSCULAR | Status: DC
  Administered 2015-02-22 – 2015-02-24 (×4): 3 mL via INTRAVENOUS

## 2015-02-22 MED ORDER — ONDANSETRON HCL 4 MG/2ML IJ SOLN
4.0000 mg | Freq: Four times a day (QID) | INTRAMUSCULAR | Status: DC | PRN
  Administered 2015-02-22: 4 mg via INTRAVENOUS
  Filled 2015-02-22: qty 2

## 2015-02-22 MED ORDER — DUTASTERIDE 0.5 MG PO CAPS
0.5000 mg | ORAL_CAPSULE | Freq: Every evening | ORAL | Status: DC
  Administered 2015-02-22 – 2015-02-23 (×2): 0.5 mg via ORAL
  Filled 2015-02-22 (×3): qty 1

## 2015-02-22 MED ORDER — SODIUM CHLORIDE 0.9 % IV SOLN: 250.0000 mL | INTRAVENOUS | Status: DC | PRN

## 2015-02-22 MED ORDER — METOPROLOL TARTRATE 12.5 MG HALF TABLET
12.5000 mg | ORAL_TABLET | Freq: Two times a day (BID) | ORAL | Status: DC
  Administered 2015-02-22 – 2015-02-24 (×5): 12.5 mg via ORAL
  Filled 2015-02-22 (×5): qty 1

## 2015-02-22 MED ORDER — TICAGRELOR 90 MG PO TABS
ORAL_TABLET | ORAL | Status: DC | PRN
  Administered 2015-02-22: 180 mg via ORAL

## 2015-02-22 NOTE — Progress Notes (Signed)
CARDIAC REHAB PHASE I  MODE:  Ambulation: about 350 ft   POST:  Rate/Rhythm: 88 SR  BP:  Supine: 131/84  Sitting:   Standing:    SaO2:  1415-1510 Just walked with RN. I checked vitals. Pt denied CP. Tolerated well. To bed after walk. MI education completed with pt and wife except for ex ed and CRP 2. Discussed stent and importance of brilinta and he has brilinta card. Discussed MI restrictions, NTG use,diet, risk factors and understanding voiced.    Graylon Good, RN BSN  02/22/2015 3:06 PM

## 2015-02-22 NOTE — Progress Notes (Signed)
6  Fr sheath removed rfa and direct pressure held 20  minuites by Ellison Carwin.  Area now soft without oozing or hematoma.  Distal pulses present.  Pt teaching done with feedback.. Dsg applied and clean, dry and intact.

## 2015-02-22 NOTE — Progress Notes (Signed)
Subjective:  This AM, he denies any complaints, like chest discomfort or dyspnea. He reports intolerance to statin medications in the past, including Crestor, for which he was placed on pitovastatin but is agreeable to taking them so long as he is in the hospital.   Objective:  Vital Signs in the last 24 hours: Temp:  [97.5 F (36.4 C)-98.2 F (36.8 C)] 98 F (36.7 C) (11/21 0750) Pulse Rate:  [0-118] 89 (11/21 1000) Resp:  [0-55] 20 (11/21 1000) BP: (111-139)/(70-98) 133/78 mmHg (11/21 1000) SpO2:  [0 %-100 %] 99 % (11/21 1000) Weight:  [200 lb 9.9 oz (91 kg)-202 lb (91.627 kg)] 200 lb 9.9 oz (91 kg) (11/21 0102)  Intake/Output from previous day: 11/20 0701 - 11/21 0700 In: 1258.9 [I.V.:1258.9] Out: 550 [Urine:550]  Physical Exam: General: elderly Caucasian male, resting in bed, NAD HEENT: PERRL, EOMI, no scleral icterus, oropharynx clear, no JVD Cardiac: RRR, no rubs, murmurs or gallops Pulm: clear to auscultation bilaterally in the anterior lung fields, no wheezes, rales, or rhonchi Abd: soft, nontender, nondistended, BS present Ext: warm and well perfused, no pedal or tibial edema Neuro: responds to questions appropriately; moving all extremities freely  Lab Results:  Recent Labs  02/21/15 2232 02/22/15 0445  WBC 3.0* 3.8*  HGB 13.6 12.3*  PLT 139* 121*    Recent Labs  02/21/15 2232 02/22/15 0445  NA 142 140  K 4.0 3.9  CL 110 112*  CO2 24 22  GLUCOSE 186* 128*  BUN 21* 17  CREATININE 1.27* 1.03    Recent Labs  02/22/15 0445  TROPONINI 10.74*    Cardiac Studies:  Procedures    Left Heart Cath    Conclusion     Prox RCA to Mid RCA lesion, 40% stenosed.  Prox LAD to Mid LAD lesion, 80% stenosed.  Mid LAD lesion, 99% stenosed. Post intervention, there is a 0% residual stenosis.  Ost LAD to Prox LAD lesion, 95% stenosed. Post intervention, there is a 0% residual stenosis.  There is mild to moderate left ventricular systolic  dysfunction.  1st Mrg lesion, 70% stenosed.  Mid Cx lesion, 70% stenosed.     Tele: Occasional PVCs  Assessment/Plan:  Mr. Baton is a 74 year old male with h/o prostate cancer s/p treatment, HTN, HLD, hypothyroidism hospitalized for NSTEMI now s/p DES placement to RCA and LAD.  NSTEMI s/p DES placement to RCA and LAD: Cath findings as noted above. EKG this AM with improved T-wave inversions in V1-V6, avL and Q waves V1-V2. -Continue ASA, Brillinta -Continue Lipitor 80mg  -Start Toprol 12.5mg  twice daily -Echo, A1c, lipid panel pending  Hyperlipidemia: Lipid panel pending. Home medications include pitavastatin 4mg  daily. -Lipitor as noted above  HTN: BP 110s-120s/70s. Home meds include lisinopril 10mg . -Continue lisinopril and metoprolol as noted above.  Hypothyroidism: Home meds include Synthroid 66mcg.  -Continue home meds  Prostate cancer s/p treatment: Continue dutasteride and hydrocortisone.    Charlott Rakes, M.D. 02/22/2015, 10:29 AM   Patient seen, examined. Available data reviewed. Agree with findings, assessment, and plan as outlined by Dr Posey Pronto. Exam reveals an alert, oriented male in no distress. Lung fields are clear. Heart is regular rate and rhythm. There are no carotid bruits. There is no pretibial edema. The right groin site is clear. EKG and lab studies reviewed. Patient is status post PCI of the LAD in the setting of non-ST elevation infarction. As he just was admitted around midnight, will keep him in the CCU today, with plans to  transfer to a telemetry bed tomorrow morning. As long as no complications arise I would anticipate hospital discharge Wednesday morning. Medications reviewed and are appropriate. Inpatient cardiac rehabilitation consult, secondary risk reduction measures reinforced with the patient, will mobilize this afternoon after his bedrest is completed.  Sherren Mocha, M.D. 02/22/2015 11:51 AM

## 2015-02-22 NOTE — Care Management Note (Signed)
Case Management Note  Patient Details  Name: Daniel Herrera MRN: [id number] Date of Birth: [birth year]  Subjective/Objective:         Adm w mi           Action/Plan: lives w wife, pcp dr amy moon   Expected Discharge Date:                  Expected Discharge Plan:  Home/Self Care  In-House Referral:     Discharge planning Services  CM Consult, Medication Assistance  Post Acute Care Choice:    Choice offered to:     DME Arranged:    DME Agency:     HH Arranged:    [place] Agency:     Status of Service:     Medicare Important Message Given:    Date Medicare IM Given:    Medicare IM give by:    Date Additional Medicare IM Given:    Additional Medicare Important Message give by:     If discussed at [place] of Stay Meetings, dates discussed:    Additional Comments: gave pt 30day free brilinta card.  Lacretia Leigh, RN 02/22/2015, 9:59 AM

## 2015-02-22 NOTE — Care Management Note (Signed)
Case Management Note  Patient Details  Name: Daniel Herrera MRN: [id number] Date of Birth: 1941-01-07  Subjective/Objective:       Adm w stemi            Action/Plan: lives w wife   Expected Discharge Date:                  Expected Discharge Plan:     In-House Referral:     Discharge planning Services     Post Acute Care Choice:    Choice offered to:     DME Arranged:    DME Agency:     HH Arranged:    [place] Agency:     Status of Service:     Medicare Important Message Given:    Date Medicare IM Given:    Medicare IM give by:    Date Additional Medicare IM Given:    Additional Medicare Important Message give by:     If discussed at [place] of Stay Meetings, dates discussed:    Additional Comments: ur review done  Lacretia Leigh, RN 02/22/2015, 8:07 AM

## 2015-02-22 NOTE — Progress Notes (Signed)
CRITICAL VALUE ALERT  Critical value received:  Troponin 10.7  Date of notification:  02/22/2015  Time of notification:  0608   Critical value read back:Yes.    Nurse who received alert:  Hadassah Pais RN   Expected lab value MD will be made aware.

## 2015-02-22 NOTE — Interval H&P Note (Signed)
Cath Lab Visit (complete for each Cath Lab visit)  Clinical Evaluation Leading to the Procedure:   ACS: Yes.    Non-ACS:    Anginal Classification: CCS IV  Anti-ischemic medical therapy: No Therapy  Non-Invasive Test Results: No non-invasive testing performed  Prior CABG: No previous CABG      History and Physical Interval Note:  02/22/2015 12:28 AM  Daniel Herrera  has presented today for surgery, with the diagnosis of chest pain  The various methods of treatment have been discussed with the patient and family. After consideration of risks, benefits and other options for treatment, the patient has consented to  Procedure(s): Left Heart Cath (N/A) as a surgical intervention .  The patient's history has been reviewed, patient examined, no change in status, stable for surgery.  I have reviewed the patient's chart and labs.  Questions were answered to the patient's satisfaction.     Quay Burow

## 2015-02-22 NOTE — H&P (View-Only) (Signed)
ANTICOAGULATION CONSULT NOTE - Initial Consult  Pharmacy Consult for Heparin Indication: chest pain/ACS  Allergies  Allergen Reactions  . Benadryl [Diphenhydramine Hcl] Swelling  . Darvocet [Propoxyphene N-Acetaminophen]     hallucination  . Darvon [Propoxyphene Hcl]     Hallucination     Patient Measurements: Height: 6\' 1"  (185.4 cm) Weight: 202 lb (91.627 kg) IBW/kg (Calculated) : 79.9 Heparin Dosing Weight: 91 kg  Vital Signs: Temp: 97.8 F (36.6 C) (11/20 2205) Temp Source: Oral (11/20 2205) BP: 138/93 mmHg (11/20 2205) Pulse Rate: 76 (11/20 2205)  Labs: No results for input(s): HGB, HCT, PLT, APTT, LABPROT, INR, HEPARINUNFRC, CREATININE, CKTOTAL, CKMB, TROPONINI in the last 72 hours.  CrCl cannot be calculated (Patient has no serum creatinine result on file.).   Medical History: Past Medical History  Diagnosis Date  . Thyroid disease   . Prostate enlargement   . GERD (gastroesophageal reflux disease)   . High cholesterol   . Prostate cancer ([place])   . Anxiety   . Arthritis   . History of prolonged Q-T interval on ECG   . Personal history of digestive disease     gastric ulcer  . Gout   . Hx of Clostridium difficile infection   . Hx of umbilical hernia repair   . Skin cancer     basal cell carcinoma    Medications:  See electronic med rec  Assessment: 74 y.o. M presents with CP.  To begin heparin for r/o ACS. Now CODE STEMI called - being transferred to cath lab.  Goal of Therapy:  Heparin level 0.3-0.7 units/ml Monitor platelets by anticoagulation protocol: Yes   Plan:  Heparin IV bolus 4000 units Heparin gtt at 1250 units/hr Will f/u heparin level in 8 hours or f/u post cath Daily heparin level and CBC  Sherlon Handing, PharmD, BCPS Clinical pharmacist, pager [phone] 02/21/2015,10:29 PM

## 2015-02-23 ENCOUNTER — Inpatient Hospital Stay (HOSPITAL_COMMUNITY): Payer: Medicare Other

## 2015-02-23 DIAGNOSIS — R079 Chest pain, unspecified: Secondary | ICD-10-CM

## 2015-02-23 LAB — BASIC METABOLIC PANEL
Anion gap: 9 (ref 5–15)
BUN: 13 mg/dL (ref 6–20)
CALCIUM: 9.3 mg/dL (ref 8.9–10.3)
CO2: 22 mmol/L (ref 22–32)
CREATININE: 1.21 mg/dL (ref 0.61–1.24)
Chloride: 108 mmol/L (ref 101–111)
GFR calc Af Amer: >60 mL/min (ref >60)
GFR, EST NON AFRICAN AMERICAN: 56 mL/min — AB (ref >60)
GLUCOSE: 131 mg/dL — AB (ref 65–99)
Potassium: 3.7 mmol/L (ref 3.5–5.1)
Sodium: 139 mmol/L (ref 135–145)

## 2015-02-23 LAB — LIPID PANEL
Cholesterol: 149 mg/dL (ref 0–200)
HDL: 38 mg/dL — ABNORMAL LOW (ref >40)
LDL CALC: 83 mg/dL (ref 0–99)
Total CHOL/HDL Ratio: 3.9 RATIO
Triglycerides: 138 mg/dL (ref <150)
VLDL: 28 mg/dL (ref 0–40)

## 2015-02-23 LAB — CBC
HEMATOCRIT: 38.9 % — AB (ref 39.0–52.0)
Hemoglobin: 12.9 g/dL — ABNORMAL LOW (ref 13.0–17.0)
MCH: 32.4 pg (ref 26.0–34.0)
MCHC: 33.2 g/dL (ref 30.0–36.0)
MCV: 97.7 fL (ref 78.0–100.0)
PLATELETS: 146 10*3/uL — AB (ref 150–400)
RBC: 3.98 MIL/uL — ABNORMAL LOW (ref 4.22–5.81)
RDW: 12.4 % (ref 11.5–15.5)
WBC: 5.3 10*3/uL (ref 4.0–10.5)

## 2015-02-23 NOTE — Progress Notes (Signed)
CARDIAC REHAB PHASE I   PRE:  Rate/Rhythm: 80 SR  BP:  Supine: 127/82  Sitting:   Standing:    SaO2:   MODE:  Ambulation: 600 ft   POST:  Rate/Rhythm: 94 SR  BP:  Supine:147/90   Sitting:   Standing:    SaO2:  1037-1125 Pt walked 600 ft with steady gait. No CP. Tolerated well. Ex ed completed with pt and family with understanding voiced. Referring to [place] Phase 2 after discussing with pt.   Graylon Good, RN BSN  02/23/2015 11:24 AM

## 2015-02-23 NOTE — Progress Notes (Signed)
Transfer delayed due to cardiac rehab ambulating patient and 2 D Echo performed.

## 2015-02-23 NOTE — Progress Notes (Signed)
    Subjective:  Yesterday, he was able to ambulate out of bed with cardiac rehab. He denies any complaints, like chest discomfort or dyspnea.   Objective:  Vital Signs in the last 24 hours: Temp:  [97.5 F (36.4 C)-98.5 F (36.9 C)] 97.5 F (36.4 C) (11/22 0731) Pulse Rate:  [73-98] 73 (11/21 2300) Resp:  [12-22] 13 (11/22 0700) BP: (120-150)/(66-98) 137/77 mmHg (11/22 0643) SpO2:  [94 %-100 %] 96 % (11/22 0244)  Intake/Output from previous day: 11/21 0701 - 11/22 0700 In: 1050 [P.O.:1050] Out: 1450 [Urine:1450]  Physical Exam: General: elderly Caucasian male, resting in bed, NAD HEENT: PERRL, EOMI, no scleral icterus, oropharynx clear, no JVD Cardiac: RRR, no rubs, murmurs or gallops Pulm: clear to auscultation bilaterally, no wheezes, rales, or rhonchi Abd: soft, nontender, nondistended, BS present Ext: warm and well perfused, no pedal or tibial edema Neuro: responds to questions appropriately; moving all extremities freely  Lab Results:  Recent Labs  02/22/15 0445 02/23/15 0255  WBC 3.8* 5.3  HGB 12.3* 12.9*  PLT 121* 146*    Recent Labs  02/22/15 0445 02/23/15 0255  NA 140 139  K 3.9 3.7  CL 112* 108  CO2 22 22  GLUCOSE 128* 131*  BUN 17 13  CREATININE 1.03 1.21    Recent Labs  02/22/15 0445 02/22/15 1055  TROPONINI 10.74* 6.71*    Cardiac Studies:  Procedures    Left Heart Cath    Conclusion     Prox RCA to Mid RCA lesion, 40% stenosed.  Prox LAD to Mid LAD lesion, 80% stenosed.  Mid LAD lesion, 99% stenosed. Post intervention, there is a 0% residual stenosis.  Ost LAD to Prox LAD lesion, 95% stenosed. Post intervention, there is a 0% residual stenosis.  There is mild to moderate left ventricular systolic dysfunction.  1st Mrg lesion, 70% stenosed.  Mid Cx lesion, 70% stenosed.    Tele: Occasional PVCs   Assessment/Plan:  Mr. Soelberg is a 74 year old male with h/o prostate cancer s/p treatment, HTN, HLD,  hypothyroidism hospitalized for NSTEMI now s/p DES placement to RCA and LAD.  NSTEMI s/p DES placement to RCA and LAD: Cath findings as noted above. Telemetry reassuring. No symptoms as well.  -Continue ASA, Brillinta -Continue Lipitor 80mg  -Transfer to telemetry today -Echo, A1c pending  Hyperlipidemia: Lipid panel with total cholesterol 144, triglycerides 138, HDL 38, LDL 83. Home medications include pitavastatin 4mg  daily. -Lipitor as noted above  HTN: BP 120s-130s/70s-80s. Home meds include lisinopril 10mg . Tolerating metoprolol 12.5mg  twice daily as well. -Continue lisinopril and metoprolol as noted above.  Hypothyroidism: Home meds include Synthroid 82mcg.  -Continue home meds  Prostate cancer s/p treatment: Continue dutasteride and hydrocortisone.    Charlott Rakes, M.D. 02/23/2015, 8:40 AM   Patient seen, examined. Available data reviewed. Agree with findings, assessment, and plan as outlined by Dr Posey Pronto. The patient is independently interviewed and examined. Lung fields are clear. Heart is regular rate and rhythm without murmur. There is no peripheral edema. Family is at the bedside today. The patient is looking much better and he has no residual chest pain. He will transfer to a telemetry bed and I would anticipate discharge tomorrow morning. I have reviewed his medications and he will continue on aspirin, brilinta, lisinopril, metoprolol, and atorvastatin. He has not been able to tolerate traditional statin drugs in the past and has failed multiple agents. Will plan to resume Livalo at discharge.   Sherren Mocha, M.D. 02/23/2015 10:46 AM

## 2015-02-23 NOTE — Progress Notes (Signed)
Report called to [facility] [place] RN on unit 3W. Patient to be transferred via wheelchair to Rm [id number]. Family at bedside and made aware of transfer.

## 2015-02-23 NOTE — Progress Notes (Signed)
  Echocardiogram 2D Echocardiogram has been performed.  Daniel Herrera 02/23/2015, 11:59 AM

## 2015-02-24 ENCOUNTER — Encounter (HOSPITAL_COMMUNITY): Payer: Self-pay | Admitting: Student

## 2015-02-24 ENCOUNTER — Telehealth: Payer: Self-pay | Admitting: Nurse Practitioner

## 2015-02-24 LAB — CBC
HCT: 38.5 % — ABNORMAL LOW (ref 39.0–52.0)
HEMOGLOBIN: 12.8 g/dL — AB (ref 13.0–17.0)
MCH: 32.3 pg (ref 26.0–34.0)
MCHC: 33.2 g/dL (ref 30.0–36.0)
MCV: 97.2 fL (ref 78.0–100.0)
Platelets: 127 10*3/uL — ABNORMAL LOW (ref 150–400)
RBC: 3.96 MIL/uL — ABNORMAL LOW (ref 4.22–5.81)
RDW: 12.3 % (ref 11.5–15.5)
WBC: 4.1 10*3/uL (ref 4.0–10.5)

## 2015-02-24 LAB — BASIC METABOLIC PANEL
Anion gap: 8 (ref 5–15)
BUN: 17 mg/dL (ref 6–20)
CALCIUM: 9.4 mg/dL (ref 8.9–10.3)
CO2: 22 mmol/L (ref 22–32)
CREATININE: 1.16 mg/dL (ref 0.61–1.24)
Chloride: 108 mmol/L (ref 101–111)
GFR calc non Af Amer: 59 mL/min — ABNORMAL LOW (ref >60)
Glucose, Bld: 129 mg/dL — ABNORMAL HIGH (ref 65–99)
Potassium: 4 mmol/L (ref 3.5–5.1)
SODIUM: 138 mmol/L (ref 135–145)

## 2015-02-24 LAB — HEMOGLOBIN A1C
HEMOGLOBIN A1C: 5.6 % (ref 4.8–5.6)
MEAN PLASMA GLUCOSE: 114 mg/dL

## 2015-02-24 MED ORDER — TICAGRELOR 90 MG PO TABS: 90.0000 mg | ORAL_TABLET | Freq: Two times a day (BID) | ORAL | Status: DC

## 2015-02-24 MED ORDER — PRAVASTATIN SODIUM 40 MG PO TABS: 80.0000 mg | ORAL_TABLET | Freq: Every day | ORAL | Status: DC

## 2015-02-24 MED ORDER — METOPROLOL TARTRATE 25 MG PO TABS: 12.5000 mg | ORAL_TABLET | Freq: Two times a day (BID) | ORAL | Status: DC

## 2015-02-24 MED ORDER — ASPIRIN 81 MG PO CHEW: 81.0000 mg | CHEWABLE_TABLET | Freq: Every day | ORAL | Status: DC

## 2015-02-24 NOTE — Progress Notes (Signed)
Patient Name: Daniel Herrera Date of Encounter: 02/24/2015  Active Problems:   NSTEMI (non-ST elevated myocardial infarction) ([place])   Abnormal EKG   Essential hypertension   Hyperlipidemia   Non-STEMI (non-ST elevated myocardial infarction) [facility])    Primary Cardiologist: [facility] - Dr. Burt Knack Patient Profile: 74 yo male w/ PMH of prostate cancer s/p treatment, HTN, HLD, and hypothyroidism hospitalized on 02/21/2015 for NSTEMI.  SUBJECTIVE: Feeling well. Denies any chest pain or palpitations. Ready to go home.  OBJECTIVE Filed Vitals:   02/23/15 1221 02/23/15 1945 02/23/15 2112 02/24/15 0429  BP: 152/86 114/65 131/89 119/64  Pulse: 76 85 88 78  Temp: 98.3 F (36.8 C) 99.4 F (37.4 C)  99 F (37.2 C)  TempSrc: Oral Oral  Oral  Resp: 17     Height:      Weight:    194 lb 8 oz (88.225 kg)  SpO2: 99% 98%  98%   No intake or output data in the 24 hours ending 02/24/15 0824 Filed Weights   02/21/15 2201 02/22/15 0102 02/24/15 0429  Weight: 202 lb (91.627 kg) 200 lb 9.9 oz (91 kg) 194 lb 8 oz (88.225 kg)    PHYSICAL EXAM General: Well developed, well nourished, male in no acute distress. Head: Normocephalic, atraumatic.  Neck: Supple without bruits, JVD not elevated. Lungs:  Resp regular and unlabored, CTA without wheezing or rales. Heart: RRR, S1, S2, no S3, S4, or murmur; no rub. Abdomen: Soft, non-tender, non-distended with normoactive bowel sounds. No hepatomegaly. No rebound/guarding. No obvious abdominal masses. Extremities: No clubbing, cyanosis, or edema. Distal pedal pulses are 2+ bilaterally. Neuro: Alert and oriented X 3. Moves all extremities spontaneously. Psych: Normal affect.   LABS: CBC: Recent Labs  02/23/15 0255 02/24/15 0255  WBC 5.3 4.1  HGB 12.9* 12.8*  HCT 38.9* 38.5*  MCV 97.7 97.2  PLT 146* [id number]*   Basic Metabolic Panel: Recent Labs  02/23/15 0255 02/24/15 0255  NA 139 138  K 3.7 4.0  CL 108 108  CO2 22 22  GLUCOSE 131* 129*    BUN 13 17  CREATININE 1.21 1.16  CALCIUM 9.3 9.4   Cardiac Enzymes: Recent Labs  02/22/15 0445 02/22/15 1055  TROPONINI 10.74* 6.71*    Recent Labs  02/21/15 2238  TROPIPOC 0.71*   BNP: No results found for: BNP PRO B NATRIURETIC PEPTIDE (BNP)  Date/Time Value Ref Range Status  10/27/2012 09:11 PM 35.7 0 - 125 pg/mL Final   Hemoglobin A1C: Recent Labs  02/23/15 0255  HGBA1C 5.6   Fasting Lipid Panel: Recent Labs  02/23/15 0255  CHOL 149  HDL 38*  LDLCALC 83  TRIG 138  CHOLHDL 3.9   TELE:   NSR with rate in 70's - 80's.      Cardiac Catheterization:    Prox RCA to Mid RCA lesion, 40% stenosed.  Prox LAD to Mid LAD lesion, 80% stenosed.  Mid LAD lesion, 99% stenosed. Post intervention, there is a 0% residual stenosis.  Ost LAD to Prox LAD lesion, 95% stenosed. Post intervention, there is a 0% residual stenosis.  There is mild to moderate left ventricular systolic dysfunction.  1st Mrg lesion, 70% stenosed.  Mid Cx lesion, 70% stenosed.  IMPRESSION:successful proximal and mid LAD PCI and stenting using symmetry drug-eluting stents the setting of non-STEMI. The patient was pain-free by the time he reached the Cath Lab. He had an ejection fraction of 45-50% with mild to moderate anteroapical hypokinesia. He does have residual  circumflex obtuse marginal branch and distal AV groove circumflex disease which I plan to treat medically. He will be treated with usual post-MI pharmacology including beta blocker, antiplatelet therapy, ACE inhibitor and high-dose statin drug. He'll be fast tracked and discharged in 72 hours.  ECHO: Results pending  Current Medications:  . aspirin  81 mg Oral Daily  . atorvastatin  80 mg Oral q1800  . dutasteride  0.5 mg Oral QPM  . fluticasone  1 spray Each Nare Daily  . hydrocortisone  1 application Rectal BID  . levothyroxine  75 mcg Oral QAC breakfast  . lisinopril  10 mg Oral Daily  . metoprolol tartrate  12.5 mg Oral BID   . pantoprazole  80 mg Oral Daily  . sodium chloride  3 mL Intravenous Q12H  . ticagrelor  90 mg Oral BID      ASSESSMENT AND PLAN:  1. NSTEMI s/p DES placement to LAD:  - Cath findings as noted above.  - cyclic troponin values were 0.71, 10.74, and 6.71. -Continue ASA, Brillinta, ACE-I, BB, and statin. Plan to resume Livalo at time of discharge as he does not tolerate higher-dose statin medications. Has been given 30-day card for Brilinta. - A1c normal at 5.6. LDL 83 on 02/23/2015. HLD low at 38.  2. Hyperlipidemia:  - LDL 83 on 02/23/2015. HLD low at 38. - resume Livalo at discharge  3. HTN:  - BP has been 114/64 - 152/89 in the past 24 hours. - continue current medications  4. Hypothyroidism - continue home Synthroid 11mcg.   5. Prostate cancer s/p treatment:  - Continue dutasteride and hydrocortisone.   Appears stable for discharge. Close Cardiology follow-up on 03/03/2015 has been arranged.  Arna Medici , PA-C 8:24 AM 02/24/2015 Pager: [phone]  Patient seen, examined. Available data reviewed. Agree with findings, assessment, and plan as outlined by [place]. See my documentation from discharge summary this same date.   Sherren Mocha, M.D. 02/24/2015 10:18 AM

## 2015-02-24 NOTE — Telephone Encounter (Signed)
TCM call.  Office closed on 11/24 and 11/25.  The pt will need TCM call on 03/01/15.

## 2015-02-24 NOTE — Discharge Summary (Signed)
CARDIOLOGY DISCHARGE SUMMARY   Patient ID: Daniel Herrera MRN: [id number] DOB/AGE: [birth year] 74 y.o.  Admit date: 02/21/2015 Discharge date: 02/24/2015  PCP: [name], NP Primary Cardiologist: New - Dr. Burt Knack  Primary Discharge Diagnosis: NSTEMI (non-ST elevated myocardial infarction) [facility]) Secondary Discharge Diagnosis: Abnormal EKG, Essential hypertension, Hyperlipidemia  Consults: None  Procedures: Left Heart Catheterization, Coronary Angiography, Percutaneous Intervention, Transthoracic Echocardiogram  Hospital Course: Daniel Herrera is a 74 y.o. male with past medical history of HTN, HLD, and prostate cancer who presented to [facility] ED on 02/21/2015 for worsening chest pain. He reported having episodes of chest pain for the past month, but became progressively worse that evening. Upon arrival to the ED, he had noted TWI in V1-V3 and his initial troponin was elevated to 0.71.   He continued to have chest pain despite administration of NTG and his repeat EKG showed ST depressions in V3 and V4. He was then taken emergently to the catheterization lab. The risks and benefits of the procedure were discussed with the patient and he agreed to proceed. His catheterization showed 80% stenosis in the proximal-LAD, 90% stenosis in the mid-LAD, and 95% stenosis in the ost-proximal LAD. He underwent successful stenting of the mid-LAD with a DES. The full report is included below.  On the morning of 02/22/2015, he was doing well following his procedure. His right groin cath site was without evidence of a hematoma or bruit. He denied any chest pain or dyspnea. He had been started on ASA, Brilinta, Lipitor and Metoprolol following his cath and was tolerating the medications well. He ambulated over 350 ft with cardiac rehab without any difficulty.  He continued to do well on 02/23/2015 and was transferred from step-down to telemetry. He ambulated another 600 ft without anginal symptoms.  He reported being intolerant to high-dose statins in the past due to severe muscle aches which made it difficult for him to ambulate. It was decided he would be switched back to his home dose of Livalo at the time of discharge.  The patient was last examined by Dr. Burt Knack on 02/24/2015 and deemed stable for discharge. He will be discharged on ASA, Brilinta, BB, ACE-I, and a statin. The importance of medication compliance going forward was discussed with the patient. He was given a printed Rx for Rockne Menghini (one to sue with his coupon and the other with refills for the year). He will have close cardiology follow-up on 03/03/2015.  Labs:   Lab Results  Component Value Date   WBC 4.1 02/24/2015   HGB 12.8* 02/24/2015   HCT 38.5* 02/24/2015   MCV 97.2 02/24/2015   PLT 127* 02/24/2015     Recent Labs Lab 02/24/15 0255  NA 138  K 4.0  CL 108  CO2 22  BUN 17  CREATININE 1.16  CALCIUM 9.4  GLUCOSE 129*    Recent Labs  02/22/15 0445 02/22/15 1055  TROPONINI 10.74* 6.71*   Lipid Panel     Component Value Date/Time   CHOL 149 02/23/2015 0255   TRIG 138 02/23/2015 0255   HDL 38* 02/23/2015 0255   CHOLHDL 3.9 02/23/2015 0255   VLDL 28 02/23/2015 0255   LDLCALC 83 02/23/2015 0255    No results found for: BNP PRO B NATRIURETIC PEPTIDE (BNP)  Date/Time Value Ref Range Status  10/27/2012 09:11 PM 35.7 0 - 125 pg/mL Final   No results for input(s): INR in the last 72 hours.    Radiology:  Dg Chest Port 1  View: 02/21/2015  CLINICAL DATA:  Intermittent chest pain onset last month but worse today. Shortness of breath and emesis. EXAM: PORTABLE CHEST 1 VIEW COMPARISON:  08/12/2014 FINDINGS: Emphysematous changes in the lungs. Normal heart size and pulmonary vascularity. No focal airspace disease or consolidation in the lungs. No blunting of costophrenic angles. No pneumothorax. Mediastinal contours appear intact. IMPRESSION: Emphysematous changes in the lungs. No evidence of active  pulmonary disease. Electronically Signed   By: Lucienne Capers M.D.   On: 02/21/2015 22:39    Cardiac Cath: 02/21/2015  Prox RCA to Mid RCA lesion, 40% stenosed.  Prox LAD to Mid LAD lesion, 80% stenosed.  Mid LAD lesion, 99% stenosed. Post intervention, there is a 0% residual stenosis.  Ost LAD to Prox LAD lesion, 95% stenosed. Post intervention, there is a 0% residual stenosis.  There is mild to moderate left ventricular systolic dysfunction.  1st Mrg lesion, 70% stenosed.  Mid Cx lesion, 70% stenosed.  IMPRESSION:successful proximal and mid LAD PCI and stenting using symmetry drug-eluting stents the setting of non-STEMI. The patient was pain-free by the time he reached the Cath Lab. He had an ejection fraction of 45-50% with mild to moderate anteroapical hypokinesia. He does have residual circumflex obtuse marginal branch and distal AV groove circumflex disease which I plan to treat medically. He will be treated with usual post-MI pharmacology including beta blocker, antiplatelet therapy, ACE inhibitor and high-dose statin drug. He'll be fast tracked and discharged in 72 hours.  EKG: NSR with rate in 70's. TWI in Leads V1 - V3.  FOLLOW UP PLANS AND APPOINTMENTS Allergies  Allergen Reactions  . Benadryl [Diphenhydramine Hcl] Swelling  . Darvocet [Propoxyphene N-Acetaminophen]     hallucination  . Darvon [Propoxyphene Hcl]     Hallucination      Medication List    STOP taking these medications        nabumetone 750 MG tablet  Commonly known as:  RELAFEN      TAKE these medications        aspirin 81 MG chewable tablet  Chew 1 tablet (81 mg total) by mouth daily.     dutasteride 0.5 MG capsule  Commonly known as:  AVODART  Take 1 capsule (0.5 mg total) by mouth every evening.     fluticasone 27.5 MCG/SPRAY nasal spray  Commonly known as:  VERAMYST  Place 1 spray into both nostrils daily.     hydrocortisone 2.5 % rectal cream  Commonly known as:  PROCTOSOL HC    Place 1 application rectally 2 (two) times daily.     levothyroxine 75 MCG tablet  Commonly known as:  SYNTHROID, LEVOTHROID  Take 75 mcg by mouth daily before breakfast.     lisinopril 10 MG tablet  Commonly known as:  PRINIVIL,ZESTRIL  Take 10 mg by mouth daily.     LIVALO 4 MG Tabs  Generic drug:  Pitavastatin Calcium  Take 4 mg by mouth daily.     metoprolol tartrate 25 MG tablet  Commonly known as:  LOPRESSOR  Take 0.5 tablets (12.5 mg total) by mouth 2 (two) times daily.     omeprazole 20 MG capsule  Commonly known as:  PRILOSEC  Take 20 mg by mouth daily.     ticagrelor 90 MG Tabs tablet  Commonly known as:  BRILINTA  Take 1 tablet (90 mg total) by mouth 2 (two) times daily.        Discharge Instructions    Amb Referral to Cardiac Rehabilitation  Complete by:  As directed   Diagnosis:   Myocardial Infarction Comment - referring to [place] PCI            Follow-up Information    Follow up with Truitt Merle, NP On 03/03/2015.   Specialties:  Nurse Practitioner, Interventional Cardiology, Cardiology, Radiology   Why:  [facility] on 03/03/2015 at 2:00PM.   Contact information:   [place] [place] [address] [phone]       BRING ALL MEDICATIONS WITH YOU TO FOLLOW UP APPOINTMENTS  Time spent with patient to include physician time: 40 minutes Signed: Erma Heritage, PA 02/24/2015, 10:08 AM Co-Sign MD   Patient seen, examined. Available data reviewed. Agree with findings, assessment, and plan as outlined by Bernerd Pho, PA-C. Exam as below:  Pt is alert and oriented, NAD  blood pressure 119/64, heart rate 78 , oxygen saturation 98%, respirations 12 HEENT: normal Neck: JVP - normal Lungs: CTA bilaterally CV: RRR without murmur or gallop Abd: soft, NT Ext: no C/C/E Skin: warm/dry no rash   the patient is stable and ready for discharge. We reviewed his medical program. Follow-up has been  arranged. All questions are answered. His daughter is at the bedside with him.   Sherren Mocha, M.D. 02/24/2015 10:16 AM

## 2015-02-24 NOTE — Telephone Encounter (Signed)
New problem    Pt has 7-10 TCm w/Lori on 11.30.16 per [place] calling.

## 2015-02-24 NOTE — Discharge Instructions (Signed)

## 2015-02-24 NOTE — Care Management Note (Addendum)
Case Management Note  Patient Details  Name: Daniel Herrera MRN: [id number] Date of Birth: [birth year]  Subjective/Objective:   Pt admitted with NSTEMI            Action/Plan:  Pt is independent from home with wife.  Pt will be discharged home on Brilinta.  CM submitted benefit check.  Pt stated his preferred pharmacy is [facility] in [place], [place] contacted pharmacy has the inventory to fill 30 day script.  CM verified that pt still has Brilinta free 30 day card given on previous unit.   Expected Discharge Date:                  Expected Discharge Plan:  Home/Self Care  In-House Referral:     Discharge planning Services  CM Consult, Medication Assistance  Post Acute Care Choice:    Choice offered to:     DME Arranged:    DME Agency:     HH Arranged:    [place] Agency:     Status of Service:  Completed, signed off  Medicare Important Message Given:    Date Medicare IM Given:    Medicare IM give by:    Date Additional Medicare IM Given:    Additional Medicare Important Message give by:     If discussed at [place] of Stay Meetings, dates discussed:    Additional Comments: CM assessed pt.  Pt discharge prior to beneift check being completed.  CM contacted pt via phone and informed him: S/W [facility] @ [facility] # [phone]   BRILINTA 90 MG BID FOR 30 DAY SUPPLY   COVER- YES  CO-PAY - ZERO DOLLARS  PRIOR APPROVAL- NO  PHARMACY: [facility], NO:[id number], [place], [place], RN 02/24/2015, 10:47 AM

## 2015-03-01 NOTE — Telephone Encounter (Signed)
Patient contacted regarding discharge from [facility] on 02/24/15.  Patient understands to follow up with provider Ellen Henri on 03/03/15 at 11:30 at [facility]. Patient understands discharge instructions? yes Patient understands medications and regiment? yes Patient understands to bring all medications to this visit? yes  States he feels great.  No c/o chest pain. States his (R) groin cath site is without redness or swelling.  Reviewed all of his medications.  States he is taking as prescribed.  Advised to bring medications to office visit. No questions regarding his care.  Advised to call if has any questions prior to [place] on 11/30.

## 2015-03-03 ENCOUNTER — Encounter: Payer: Medicare Other | Admitting: Nurse Practitioner

## 2015-03-03 ENCOUNTER — Ambulatory Visit (INDEPENDENT_AMBULATORY_CARE_PROVIDER_SITE_OTHER): Payer: Medicare Other | Admitting: Cardiology

## 2015-03-03 ENCOUNTER — Encounter: Payer: Self-pay | Admitting: Cardiology

## 2015-03-03 ENCOUNTER — Other Ambulatory Visit: Payer: Self-pay | Admitting: Cardiology

## 2015-03-03 VITALS — BP 136/74 | HR 67 | Ht 73.0 in | Wt 200.4 lb

## 2015-03-03 DIAGNOSIS — I10 Essential (primary) hypertension: Secondary | ICD-10-CM

## 2015-03-03 DIAGNOSIS — R9431 Abnormal electrocardiogram [ECG] [EKG]: Secondary | ICD-10-CM

## 2015-03-03 DIAGNOSIS — C61 Malignant neoplasm of prostate: Secondary | ICD-10-CM | POA: Diagnosis not present

## 2015-03-03 MED ORDER — NITROGLYCERIN 0.4 MG SL SUBL: 0.4000 mg | SUBLINGUAL_TABLET | SUBLINGUAL | Status: DC | PRN

## 2015-03-03 NOTE — Telephone Encounter (Signed)
REFILL 

## 2015-03-03 NOTE — Patient Instructions (Signed)
Medication Instructions:  Your physician has recommended you make the following change in your medication:  1.  START Nitroglycerin as directed as needed   Labwork: NONE ORDERED  Testing/Procedures: NONE ORDERED  Follow-Up: Your physician recommends that you schedule a follow-up appointment in: [place]   Any Other Special Instructions Will Be Listed Below (If Applicable).     If you need a refill on your cardiac medications before your next appointment, please call your pharmacy.

## 2015-03-03 NOTE — Progress Notes (Signed)
03/03/2015 ZADOK GROMAN   10/11/1940  [id number]  Primary Physician [name], NP Primary Cardiologist: Dr. Burt Knack   Reason for Visit/CC: [facility] F/u for CAD/ s/p NSTEMI.   HPI:  Daniel Herrera is a 74 y.o. male with past medical history of HTN, HLD, and prostate cancer who presented to [facility] ED on 02/21/2015 for worsening chest pain. He reported having episodes of chest pain for the past month, but became progressively worse that evening. Upon arrival to the ED, he had noted TWI in V1-V3 and his initial troponin was elevated to 0.71.   He continued to have chest pain despite administration of NTG and his repeat EKG showed ST depressions in V3 and V4. He was then taken emergently to the catheterization lab. His catheterization showed 80% stenosis in the proximal-LAD, 90% stenosis in the mid-LAD, and 95% stenosis in the ost-proximal LAD. He underwent successful stenting of the proximal and mid-LAD with DES. EF was 45-50%. He had no recurrent CP and no post cath complications.   He was started on ASA, Brilinta, Livalo, Metoprolol and lisinopril following his cath.  He presents to clinic today for post hospital f/u.  He reports that he has done well since discharge. This is the best that he has felt in over 3 years. He denies any recurrent chest discomfort. No dyspnea. He reports full medication compliance and denies any intolerances. He denies any abnormal bleeding with dual antiplatelet therapy. His monthly co-pay for Kary Kos is affordable. He states that he did not receive a prescription for sublingual nitroglycerin time of discharge. He is interested in enrolling in outpatient cardiac rehabilitation.  Today in clinic, his EKG shows normal sinus rhythm. Heart rate is well controlled at 67 bpm. His blood pressure is well-controlled at 136/74.   Current Outpatient Prescriptions  Medication Sig Dispense Refill  . aspirin 81 MG chewable tablet Chew 1 tablet (81 mg total) by  mouth daily.    . clobetasol ointment (TEMOVATE) [id number] % Apply 1 application topically daily.  1  . dutasteride (AVODART) 0.5 MG capsule Take 1 capsule (0.5 mg total) by mouth every evening. 30 capsule 5  . fluticasone (VERAMYST) 27.5 MCG/SPRAY nasal spray Place 1 spray into both nostrils daily.     [name] levothyroxine (SYNTHROID, LEVOTHROID) 75 MCG tablet Take 75 mcg by mouth daily before breakfast.    . lisinopril (PRINIVIL,ZESTRIL) 10 MG tablet Take 10 mg by mouth daily.    . metoprolol tartrate (LOPRESSOR) 25 MG tablet Take 0.5 tablets (12.5 mg total) by mouth 2 (two) times daily. 60 tablet 6  . omeprazole (PRILOSEC) 20 MG capsule Take 20 mg by mouth daily.    . Pitavastatin Calcium (LIVALO) 4 MG TABS Take 4 mg by mouth daily.    . ticagrelor (BRILINTA) 90 MG TABS tablet Take 1 tablet (90 mg total) by mouth 2 (two) times daily. 60 tablet 10   No current facility-administered medications for this visit.    Allergies  Allergen Reactions  . Benadryl [Diphenhydramine Hcl] Swelling  . Darvocet [Propoxyphene N-Acetaminophen]     hallucination  . Darvon [Propoxyphene Hcl]     Hallucination     Social History   Social History  . Marital Status: Married    Spouse Name: N/A  . Number of Children: N/A  . Years of Education: N/A   Occupational History  . retired    Social History Main Topics  . Smoking status: Never Smoker   . Smokeless tobacco: Never Used  .  Alcohol Use: No  . Drug Use: No  . Sexual Activity: Not on file   Other Topics Concern  . Not on file   Social History Narrative     Review of Systems: General: negative for chills, fever, night sweats or weight changes.  Cardiovascular: negative for chest pain, dyspnea on exertion, edema, orthopnea, palpitations, paroxysmal nocturnal dyspnea or shortness of breath Dermatological: negative for rash Respiratory: negative for cough or wheezing Urologic: negative for hematuria Abdominal: negative for nausea, vomiting,  diarrhea, bright red blood per rectum, melena, or hematemesis Neurologic: negative for visual changes, syncope, or dizziness All other systems reviewed and are otherwise negative except as noted above.    Blood pressure 136/74, pulse 67, height 6\' 1"  (1.854 m), weight 200 lb 6.4 oz (90.901 kg).  General appearance: alert, cooperative and no distress Neck: no carotid bruit and no JVD Lungs: clear to auscultation bilaterally Heart: regular rate and rhythm, S1, S2 normal, no murmur, click, rub or gallop Extremities: no LEE Pulses: 2+ and symmetric Skin: warm and dry Neurologic: Grossly normal  EKG NSR. 67 bpm.   ASSESSMENT AND PLAN:   1. CAD: recent NSTEMI. Catheterization showed 80% stenosis in the proximal-LAD, 90% stenosis in the mid-LAD, and 95% stenosis in the ost-proximal LAD. He underwent successful stenting of the proximal and mid-LAD with DES. EF mildly reduced at 45-50%. He has remained stable without any recurrent anginal symptoms. No dyspnea. Volume is stable. Continue medical therapy for secondary prevention. Continue dual antiplatelet therapy with aspirin plus Brilinta for a minimum of one year given newly placed drug-eluting stents. Continue beta blocker, ACE inhibitor and statin therapy.   2. HTN: BP is well controlled on current regimen. Continue metoprolol and lisinopril.   3. HLD: LDL 83 mg/dL on recent lipid panel. Continue Livalo.   PLAN  Continue medical therapy for secondary prevention for CAD. Follow-up with Dr. Burt Knack in 6 weeks.  SIMMONS, BRITTAINY PA-C 03/03/2015 10:50 AM

## 2015-03-08 DIAGNOSIS — D510 Vitamin B12 deficiency anemia due to intrinsic factor deficiency: Secondary | ICD-10-CM | POA: Diagnosis not present

## 2015-03-09 DIAGNOSIS — M65331 Trigger finger, right middle finger: Secondary | ICD-10-CM | POA: Diagnosis not present

## 2015-03-09 DIAGNOSIS — G5601 Carpal tunnel syndrome, right upper limb: Secondary | ICD-10-CM | POA: Diagnosis not present

## 2015-03-15 ENCOUNTER — Telehealth: Payer: Self-pay | Admitting: Cardiovascular Disease

## 2015-03-15 ENCOUNTER — Encounter: Payer: Self-pay | Admitting: Physician Assistant

## 2015-03-15 ENCOUNTER — Ambulatory Visit (INDEPENDENT_AMBULATORY_CARE_PROVIDER_SITE_OTHER): Payer: Medicare Other | Admitting: Physician Assistant

## 2015-03-15 ENCOUNTER — Other Ambulatory Visit: Payer: Self-pay | Admitting: Physician Assistant

## 2015-03-15 ENCOUNTER — Ambulatory Visit (INDEPENDENT_AMBULATORY_CARE_PROVIDER_SITE_OTHER): Payer: Medicare Other

## 2015-03-15 VITALS — BP 122/80 | HR 81 | Ht 73.0 in | Wt 198.9 lb

## 2015-03-15 DIAGNOSIS — I10 Essential (primary) hypertension: Secondary | ICD-10-CM | POA: Diagnosis not present

## 2015-03-15 DIAGNOSIS — R002 Palpitations: Secondary | ICD-10-CM

## 2015-03-15 DIAGNOSIS — I2510 Atherosclerotic heart disease of native coronary artery without angina pectoris: Secondary | ICD-10-CM | POA: Diagnosis not present

## 2015-03-15 DIAGNOSIS — R9431 Abnormal electrocardiogram [ECG] [EKG]: Secondary | ICD-10-CM

## 2015-03-15 DIAGNOSIS — E785 Hyperlipidemia, unspecified: Secondary | ICD-10-CM

## 2015-03-15 HISTORY — DX: Palpitations: R00.2

## 2015-03-15 MED ORDER — METOPROLOL TARTRATE 25 MG PO TABS
25.0000 mg | ORAL_TABLET | Freq: Two times a day (BID) | ORAL | Status: DC
Start: 2015-03-15 — End: 2015-04-08

## 2015-03-15 NOTE — Telephone Encounter (Signed)
New pt of Dr. Gwenlyn Found, hospitalized for STEMI on 11/20. Left heart cath. Conclusions:  Prox RCA to Mid RCA lesion, 40% stenosed.  Prox LAD to Mid LAD lesion, 80% stenosed.  Mid LAD lesion, 99% stenosed. Post intervention, there is a 0% residual stenosis.  Ost LAD to Prox LAD lesion, 95% stenosed. Post intervention, there is a 0% residual stenosis.  There is mild to moderate left ventricular systolic dysfunction.  1st Mrg lesion, 70% stenosed.  Mid Cx lesion, 70% stenosed.   Pt saw [place] for f/u on 11/30. Notes no new problems or concerns until this Friday night, he began to feel heavy heart "fluttering" which has persisted since. No A Fib history noted.  He took BP and pulse today, obtained 145/90 w HR 63, repeat reading 150/80 HR 72  He reports that he can feel pulse speeding up and pausing, and that he is lightheaded on standing. Pt requested evaluation.  Openings on Bryan's schedule - per Mariann Laster due to cancellations, OK to add patient.  Called patient and added for today at 2pm, he was given instructions and directions for appt.  Routed to [place] for his review.

## 2015-03-15 NOTE — Telephone Encounter (Signed)
New message      Pt had and "episode" Friday with his heart beating irregular.  He is very weak and sob with exertion.  It has not happened again since Friday, but he want to talk to a nurse.  Please advise

## 2015-03-15 NOTE — Progress Notes (Signed)
Patient ID: Daniel Herrera, male   DOB: 08-Feb-1941, 74 y.o.   MRN: [id number]    Date:  03/15/2015   ID:  Daniel Herrera, DOB Aug 25, 1940, MRN [id number]  PCP:  [name], NP  Primary Cardiologist:  Burt Knack  Chief Complaint  Patient presents with  . Follow-up    pt states he has some chest pain and feels very weak, just feels like a tightness  . Shortness of Breath    still SOB, pt states that sometimes when he stands up that he has the feeling of passing out, thinks its because he is weak  . Edema    no edema     History of Present Illness: Daniel Herrera is a 74 y.o. male with past medical history of HTN, HLD, and prostate cancer who presented to [facility] ED on 02/21/2015 for worsening chest pain. He reported having episodes of chest pain for the past month, but became progressively worse that evening. Upon arrival to the ED, he had noted TWI in V1-V3 and his initial troponin was elevated to 0.71.   He continued to have chest pain despite administration of NTG and his repeat EKG showed ST depressions in V3 and V4. He was then taken emergently to the catheterization lab. His catheterization showed 80% stenosis in the proximal-LAD, 90% stenosis in the mid-LAD, and 95% stenosis in the ost-proximal LAD. He underwent successful stenting of the proximal and mid-LAD with DES. EF was 45-50%. He had no recurrent CP and no post cath complications. He was started on ASA, Brilinta, Livalo, Metoprolol and lisinopril following his cath.  Patient was seen post hospital follow-up on November 30 at that time he reported doing well since discharge. He said this is the best that he has felt in over 3 years.   He is here today with complaints of fluttering in his chest is occurred on Friday night. He was feeling weak, occasionally pass out, and has had shortness of breath. He also reports light flashing in his eyes.  Noncompliant with Brilinta is denied any chest pain.  He also denies nausea,  vomiting, fever, orthopnea, dizziness, PND, cough, congestion, abdominal pain, hematochezia, melena, lower extremity edema, claudication.  Wt Readings from Last 3 Encounters:  03/15/15 198 lb 14.4 oz (90.22 kg)  03/03/15 200 lb 6.4 oz (90.901 kg)  02/24/15 194 lb 8 oz (88.225 kg)     Past Medical History  Diagnosis Date  . Thyroid disease   . Prostate enlargement   . GERD (gastroesophageal reflux disease)   . High cholesterol   . Prostate cancer ([place])   . Anxiety   . Arthritis   . History of prolonged Q-T interval on ECG   . Personal history of digestive disease     gastric ulcer  . Gout   . Hx of Clostridium difficile infection   . Hx of umbilical hernia repair   . Skin cancer     basal cell carcinoma  . CAD (coronary artery disease)     a. 02/2015: DES to mid-LAD    Current Outpatient Prescriptions  Medication Sig Dispense Refill  . aspirin 81 MG chewable tablet Chew 1 tablet (81 mg total) by mouth daily.    . clobetasol ointment (TEMOVATE) [id number] % Apply 1 application topically daily.  1  . dutasteride (AVODART) 0.5 MG capsule Take 1 capsule (0.5 mg total) by mouth every evening. 30 capsule 5  . fluticasone (VERAMYST) 27.5 MCG/SPRAY nasal spray Place 1 spray into both nostrils  daily.     . levothyroxine (SYNTHROID, LEVOTHROID) 75 MCG tablet Take 75 mcg by mouth daily before breakfast.    . lisinopril (PRINIVIL,ZESTRIL) 10 MG tablet Take 10 mg by mouth daily.    . metoprolol tartrate (LOPRESSOR) 25 MG tablet Take 1 tablet (25 mg total) by mouth 2 (two) times daily. 60 tablet 6  . nitroGLYCERIN (NITROSTAT) 0.4 MG SL tablet DISSOLVE 1 TABLET UNDER TONGUE EVERY 5 MINUTES FOR 3 DOSES AS NEEDED CHEST PAIN. IF NO RELIEF CALL 911 75 tablet 2  . omeprazole (PRILOSEC) 20 MG capsule Take 20 mg by mouth daily.    . Pitavastatin Calcium (LIVALO) 4 MG TABS Take 4 mg by mouth daily.    . ticagrelor (BRILINTA) 90 MG TABS tablet Take 1 tablet (90 mg total) by mouth 2 (two) times daily. 60  tablet 10   No current facility-administered medications for this visit.    Allergies:    Allergies  Allergen Reactions  . Benadryl [Diphenhydramine Hcl] Swelling  . Darvocet [Propoxyphene N-Acetaminophen]     hallucination  . Darvon [Propoxyphene Hcl]     Hallucination     Social History:  The patient  reports that he has never smoked. He has never used smokeless tobacco. He reports that he does not drink alcohol or use illicit drugs.   Family history:   Family History  Problem Relation Age of Onset  . Cancer Mother   . Cancer Father     prostate  . Cancer Sister     ROS:  Please see the history of present illness.  All other systems reviewed and negative.   PHYSICAL EXAM: VS:  BP 122/80 mmHg  Pulse 81  Ht 6\' 1"  (1.854 m)  Wt 198 lb 14.4 oz (90.22 kg)  BMI 26.25 kg/m2 Well nourished, well developed, in no acute distress HEENT: Pupils are equal round react to light accommodation extraocular movements are intact.  Neck: no JVDNo cervical lymphadenopathy. Cardiac: Regular rate and rhythm without murmurs rubs or gallops. Lungs:  clear to auscultation bilaterally, no wheezing, rhonchi or rales Abd: soft, nontender, positive bowel sounds all quadrants, no hepatosplenomegaly Ext: no lower extremity edema.  2+ radial and dorsalis pedis pulses. Skin: warm and dry Neuro:  Grossly normal  EKG:  Sinus rhythm with regularly abberant conduction. T-wave inversion in V2 and 3    ASSESSMENT AND PLAN:  Problem List Items Addressed This Visit    Palpitations   Hyperlipidemia   Relevant Medications   metoprolol tartrate (LOPRESSOR) 25 MG tablet   Essential hypertension   Relevant Medications   metoprolol tartrate (LOPRESSOR) 25 MG tablet    Other Visit Diagnoses    Heart palpitations    -  Primary    Relevant Orders    EKG 12-Lead      Palpitations:  He's having some abberant complexes, which on his EKG are regular, but during exam were sporadic.  This is what he is  feeling.  I have ordered a 7 day event monitor to see if there is more to it and increased metoprolol to 25mg  BID.  Follow up in two weeks.  Essential HTN:  BP is well controlled.   CAD:  Some of his lateral TWI have resolved.  ASA, brilinta, BB, Statin.  No CP.

## 2015-03-15 NOTE — Patient Instructions (Addendum)
Your physician has recommended that you wear an event monitor. Event monitors are medical devices that record the heart's electrical activity. Doctors most often [place] these monitors to diagnose arrhythmias. Arrhythmias are problems with the speed or rhythm of the heartbeat. The monitor is a small, portable device. You can wear one while you do your normal daily activities. This is usually used to diagnose what is causing palpitations/syncope (passing out). This will be worn for 7 days and will be placed on at the [address] office.  Your physician has recommended you make the following change in your medication: the metoprolol has been increased to 1 tablet twice a day. A new prescription has been sent to [facility] to reflect this change.  Your physician recommends that you schedule a follow-up appointment in: 2 weeks with Dr. Burt Knack or Ellen Henri.

## 2015-03-22 DIAGNOSIS — H43813 Vitreous degeneration, bilateral: Secondary | ICD-10-CM | POA: Diagnosis not present

## 2015-03-22 DIAGNOSIS — H52203 Unspecified astigmatism, bilateral: Secondary | ICD-10-CM | POA: Diagnosis not present

## 2015-03-22 DIAGNOSIS — H26493 Other secondary cataract, bilateral: Secondary | ICD-10-CM | POA: Diagnosis not present

## 2015-03-22 DIAGNOSIS — Z961 Presence of intraocular lens: Secondary | ICD-10-CM | POA: Diagnosis not present

## 2015-04-01 ENCOUNTER — Telehealth: Payer: Self-pay | Admitting: Medical Oncology

## 2015-04-01 NOTE — Telephone Encounter (Signed)
Oncology Nurse Navigator Documentation  Oncology Nurse Navigator Flowsheets 12/14/2014 12/29/2014 04/01/2015  Referral date to RadOnc/MedOnc - - 08/14/2014  Navigator Encounter Type - Treatment Telephone;3 month I called Daniel Herrera 3 month post radiation treatments. He states he is doing well from the prostate cancer but had a heart attack 11/20. He has to have a cardiac catheter and 2 stents. He was doing better and then developed arrhythmias. He is currently wearing a heart monitor and hopes to get it off tomorrow.  He followed up with Dr. Alinda Money 11/30 and his PSA was 0.14. He continues his ADT and will continue to be followed by Dr. Alinda Money. I asked him to call me with any questions or concerns. He voiced understanding.  Patient Visit Type Radonc Radonc -  Treatment Phase Treatment Final Radiation Tx -  Barriers/Navigation Needs - No barriers at this time -  Interventions Coordination of Care - -  Coordination of Care Other - -  Support Groups/Services Friends and Family Friends and Family -  Time Spent with Patient [phone] 4

## 2015-04-02 ENCOUNTER — Ambulatory Visit (INDEPENDENT_AMBULATORY_CARE_PROVIDER_SITE_OTHER): Payer: Medicare Other | Admitting: Cardiology

## 2015-04-02 ENCOUNTER — Encounter: Payer: Self-pay | Admitting: Cardiology

## 2015-04-02 VITALS — BP 115/68 | HR 61 | Ht 73.0 in | Wt 198.1 lb

## 2015-04-02 DIAGNOSIS — R002 Palpitations: Secondary | ICD-10-CM | POA: Diagnosis not present

## 2015-04-02 LAB — CBC WITH DIFFERENTIAL/PLATELET
Basophils Absolute: 0 10*3/uL (ref 0.0–0.1)
Basophils Relative: 0 % (ref 0–1)
EOS ABS: 0 10*3/uL (ref 0.0–0.7)
EOS PCT: 1 % (ref 0–5)
HEMATOCRIT: 41.3 % (ref 39.0–52.0)
Hemoglobin: 14.1 g/dL (ref 13.0–17.0)
LYMPHS PCT: 16 % (ref 12–46)
Lymphs Abs: 0.7 10*3/uL (ref 0.7–4.0)
MCH: 32 pg (ref 26.0–34.0)
MCHC: 34.1 g/dL (ref 30.0–36.0)
MCV: 93.7 fL (ref 78.0–100.0)
MONO ABS: 0.4 10*3/uL (ref 0.1–1.0)
MPV: 8.5 fL — AB (ref 8.6–12.4)
Monocytes Relative: 9 % (ref 3–12)
Neutro Abs: 3 10*3/uL (ref 1.7–7.7)
Neutrophils Relative %: 74 % (ref 43–77)
PLATELETS: 157 10*3/uL (ref 150–400)
RBC: 4.41 MIL/uL (ref 4.22–5.81)
RDW: 13.2 % (ref 11.5–15.5)
WBC: 4.1 10*3/uL (ref 4.0–10.5)

## 2015-04-02 LAB — MAGNESIUM: MAGNESIUM: 2.3 mg/dL (ref 1.5–2.5)

## 2015-04-02 LAB — BASIC METABOLIC PANEL
BUN: 21 mg/dL (ref 7–25)
CO2: 26 mmol/L (ref 20–31)
CREATININE: 1.29 mg/dL — AB (ref 0.70–1.18)
Calcium: 9.2 mg/dL (ref 8.6–10.3)
Chloride: 106 mmol/L (ref 98–110)
GLUCOSE: 120 mg/dL — AB (ref 65–99)
Potassium: 4.1 mmol/L (ref 3.5–5.3)
Sodium: 140 mmol/L (ref 135–146)

## 2015-04-02 MED ORDER — METOPROLOL TARTRATE 25 MG PO TABS: 25.0000 mg | ORAL_TABLET | Freq: Three times a day (TID) | ORAL | Status: DC

## 2015-04-02 NOTE — Progress Notes (Signed)
04/02/2015 Daniel Herrera   05/18/1940  [id number]  Primary Physician [name], NP Primary Cardiologist: Dr. Burt Knack   Reason for Visit/CC: F/u for Palpitations  HPI: Daniel Herrera is a 74 y.o. male with past medical history of HTN, HLD, and prostate cancer who presented to [facility] ED on 02/21/2015 for worsening chest pain. He reported having episodes of chest pain for the past month, but became progressively worse that evening. Upon arrival to the ED, he had noted TWI in V1-V3 and his initial troponin was elevated to 0.71.   He continued to have chest pain despite administration of NTG and his repeat EKG showed ST depressions in V3 and V4. He was then taken emergently to the catheterization lab. His catheterization showed 80% stenosis in the proximal-LAD, 90% stenosis in the mid-LAD, and 95% stenosis in the ost-proximal LAD. He underwent successful stenting of the proximal and mid-LAD with DES. EF was 45-50%. He had no recurrent CP and no post cath complications. He was started on ASA, Brilinta, Livalo, Metoprolol and lisinopril following his cath.  I evaluated him on 03/03/15 for post hospital f/u and he was stable without recurrent CP. He called the office 2 weeks later and was seen by Tarri Fuller, PA-C, with a complaint of tachy palpitations and fatigue. Mr. Daniel Herrera increased his metoprolol to 25 mg daily and ordered a 7 day heart monitor.   The patient presents back to clinic for f/u. Monitored showed PVCs but no other arrhthymias. He does not note much improvement since he was last seen. He continues to have episodic tachy palpitations that feel irregular. He denies any associated angina. No dyspnea, syncope/ near syncope but he does note fatigue. He reports full medication compliance.    Current Outpatient Prescriptions  Medication Sig Dispense Refill  . aspirin 81 MG chewable tablet Chew 1 tablet (81 mg total) by mouth daily.    . clobetasol ointment (TEMOVATE) [id number] % Apply  1 application topically daily.  1  . dutasteride (AVODART) 0.5 MG capsule Take 1 capsule (0.5 mg total) by mouth every evening. 30 capsule 5  . fluticasone (VERAMYST) 27.5 MCG/SPRAY nasal spray Place 1 spray into both nostrils daily.     [name] levothyroxine (SYNTHROID, LEVOTHROID) 75 MCG tablet Take 75 mcg by mouth daily before breakfast.    . lisinopril (PRINIVIL,ZESTRIL) 10 MG tablet Take 10 mg by mouth daily.    . metoprolol tartrate (LOPRESSOR) 25 MG tablet Take 1 tablet (25 mg total) by mouth 2 (two) times daily. 60 tablet 6  . nitroGLYCERIN (NITROSTAT) 0.4 MG SL tablet DISSOLVE 1 TABLET UNDER TONGUE EVERY 5 MINUTES FOR 3 DOSES AS NEEDED CHEST PAIN. IF NO RELIEF CALL 911 75 tablet 2  . omeprazole (PRILOSEC) 20 MG capsule Take 20 mg by mouth daily.    . Pitavastatin Calcium (LIVALO) 4 MG TABS Take 4 mg by mouth daily.    . ticagrelor (BRILINTA) 90 MG TABS tablet Take 1 tablet (90 mg total) by mouth 2 (two) times daily. 60 tablet 10   No current facility-administered medications for this visit.    Allergies  Allergen Reactions  . Benadryl [Diphenhydramine Hcl] Swelling  . Darvocet [Propoxyphene N-Acetaminophen]     hallucination  . Darvon [Propoxyphene Hcl]     Hallucination     Social History   Social History  . Marital Status: Married    Spouse Name: N/A  . Number of Children: N/A  . Years of Education: N/A   Occupational History  .  retired    Social History Main Topics  . Smoking status: Never Smoker   . Smokeless tobacco: Never Used  . Alcohol Use: No  . Drug Use: No  . Sexual Activity: Not on file   Other Topics Concern  . Not on file   Social History Narrative     Review of Systems: General: negative for chills, fever, night sweats or weight changes.  Cardiovascular: negative for chest pain, dyspnea on exertion, edema, orthopnea, palpitations, paroxysmal nocturnal dyspnea or shortness of breath Dermatological: negative for rash Respiratory: negative for cough  or wheezing Urologic: negative for hematuria Abdominal: negative for nausea, vomiting, diarrhea, bright red blood per rectum, melena, or hematemesis Neurologic: negative for visual changes, syncope, or dizziness All other systems reviewed and are otherwise negative except as noted above.    Blood pressure 115/68, pulse 61, height 6\' 1"  (1.854 m), weight 198 lb 1.9 oz (89.867 kg).  General appearance: alert, cooperative and no distress Neck: no carotid bruit and no JVD Lungs: clear to auscultation bilaterally Heart: regular rate and rhythm and occasional PVCs Extremities: no LEE Pulses: 2+ and symmetric Skin: warm and dry Neurologic: Grossly normal  EKG not performed  ASSESSMENT AND PLAN:   2. Palpitations: recent cardiac monitor showed PVCs. No other arrhthymias. He continues to be symptomatic with his PVCs. He is on 25 mg of metoprolol BID. His BP today is 115/68 and pulse rate is 61 bpm. I've discussed case with Dr. Angelena Form, DOD. We will increase frequency of metoprolol to 25 mg TID in an effort to suppress his PVCs and reduce symptoms. Will also check a BMP, CBC and TSH (on Synthroid).   1. CAD: recent NSTEMI. Catheterization showed 80% stenosis in the proximal-LAD, 90% stenosis in the mid-LAD, and 95% stenosis in the ost-proximal LAD. He underwent successful stenting of the proximal and mid-LAD with DES. EF mildly reduced at 45-50%. He has remained stable without any recurrent anginal symptoms. No dyspnea. Volume is stable. Continue medical therapy for secondary prevention. Continue dual antiplatelet therapy with aspirin plus Brilinta for a minimum of one year given newly placed drug-eluting stents. Continue beta blocker, ACE inhibitor and statin therapy.   2. HTN: BP is well controlled on current regimen. Continue metoprolol and lisinopril.   3. HLD: LDL 83 mg/dL on recent lipid panel. Continue Livalo.   PLAN Continue medical therapy for secondary prevention for CAD. Increase  metoprolol to TID given symptomatic PVCs. F/u in 2 weeks to reassess symptoms and response to therapy.    Lyda Jester PA-C 04/02/2015 10:46 AM

## 2015-04-02 NOTE — Patient Instructions (Signed)
Medication Instructions:  Your physician has recommended you make the following change in your medication:  1. Start Metoprolol ( 25 mg ) three times a day   Labwork: Your physician recommends that you have lab work today: tsh/free t4/free t3/bmet/cbc/mag   Testing/Procedures: -None  Follow-Up: Your physician recommends that you keep your scheduled  follow-up appointment with Tarri Fuller, PA-C   Any Other Special Instructions Will Be Listed Below (If Applicable).     If you need a refill on your cardiac medications before your next appointment, please call your pharmacy.

## 2015-04-03 LAB — T4, FREE: Free T4: 1.34 ng/dL (ref 0.80–1.80)

## 2015-04-03 LAB — T3, FREE: T3 FREE: 2.6 pg/mL (ref 2.3–4.2)

## 2015-04-03 LAB — TSH: TSH: 0.988 u[IU]/mL (ref 0.350–4.500)

## 2015-04-08 ENCOUNTER — Ambulatory Visit (INDEPENDENT_AMBULATORY_CARE_PROVIDER_SITE_OTHER): Payer: Medicare Other | Admitting: Physician Assistant

## 2015-04-08 ENCOUNTER — Encounter: Payer: Self-pay | Admitting: Physician Assistant

## 2015-04-08 VITALS — BP 132/64 | HR 66 | Ht 73.0 in | Wt 204.4 lb

## 2015-04-08 DIAGNOSIS — R0789 Other chest pain: Secondary | ICD-10-CM

## 2015-04-08 DIAGNOSIS — R0602 Shortness of breath: Secondary | ICD-10-CM | POA: Diagnosis not present

## 2015-04-08 DIAGNOSIS — I2510 Atherosclerotic heart disease of native coronary artery without angina pectoris: Secondary | ICD-10-CM

## 2015-04-08 DIAGNOSIS — R002 Palpitations: Secondary | ICD-10-CM

## 2015-04-08 DIAGNOSIS — E785 Hyperlipidemia, unspecified: Secondary | ICD-10-CM

## 2015-04-08 DIAGNOSIS — I10 Essential (primary) hypertension: Secondary | ICD-10-CM | POA: Diagnosis not present

## 2015-04-08 DIAGNOSIS — R0600 Dyspnea, unspecified: Secondary | ICD-10-CM

## 2015-04-08 DIAGNOSIS — D510 Vitamin B12 deficiency anemia due to intrinsic factor deficiency: Secondary | ICD-10-CM | POA: Diagnosis not present

## 2015-04-08 DIAGNOSIS — I2583 Coronary atherosclerosis due to lipid rich plaque: Secondary | ICD-10-CM

## 2015-04-08 DIAGNOSIS — R079 Chest pain, unspecified: Secondary | ICD-10-CM

## 2015-04-08 NOTE — Progress Notes (Signed)
Patient ID: Daniel Herrera, male   DOB: October 09, 1940, 75 y.o.   MRN: [id number]    Date:  04/08/2015   ID:  Daniel Herrera, DOB 05/22/1940, MRN [id number]  PCP:  [name], NP  Primary Cardiologist:  Burt Knack   Chief Complaint  Patient presents with  . Follow-up    heart rhythm is off, tingling pain across chest, has shortness of breath, no edema, no pain in legs,no cramping in legs, occassional lightheadedness & dizziness     History of Present Illness: Daniel Herrera is a 75 y.o. male with past medical history of HTN, HLD, and prostate cancer who presented to [facility] ED on 02/21/2015 for worsening chest pain. He reported having episodes of chest pain for the past month, but became progressively worse that evening. Upon arrival to the ED, he had noted TWI in V1-V3 and his initial troponin was elevated to 0.71.   He continued to have chest pain despite administration of NTG and his repeat EKG showed ST depressions in V3 and V4. He was then taken emergently to the catheterization lab. His catheterization showed 80% stenosis in the proximal-LAD, 90% stenosis in the mid-LAD, and 95% stenosis in the ost-proximal LAD. He underwent successful stenting of the proximal and mid-LAD with DES. EF was 45-50%. He had no recurrent CP and no post cath complications. He was started on ASA, Brilinta, Livalo, Metoprolol and lisinopril following his cath.  Ms Rosita Fire, [facility], evaluated him on 03/03/15 for post hospital f/u and he was stable without recurrent CP. He called the office 2 weeks later and was seen by me with a complaint of tachy palpitations and fatigue. I increased his metoprolol to 25 mg daily and ordered a 7 day heart monitor.   The patient presented back to clinic for f/u on December 30. Monitored showed PVCs but no other arrhthymias. He did not note much improvement since he was last seen. He continues to have episodic tachy palpitations that feel irregular. He denies any associated angina. No  dyspnea, syncope/ near syncope but he does note fatigue. He reports full medication compliance. Metoprolol was increased to 3 times daily.  CBC and basic metabolic panel were okay.  TSH free T4 and T3 were within normal limits.    Patient is now here for follow-up.  He still reports not much improvement in his symptoms. He is getting extremely fatigued and tired particularly when he walks on his treadmill which he can only do 10 minutes of. He is also developing 6 out of 10 chest tightness which resolves within about 30 minutes after stopping the treadmill. The interesting thing is that he felt really good for the first 20 days after his LAD stenting and then that's when he started to feeling fatigued, short of breath with chest tightness.  He also reports having an episode of dizziness which was severe enough that he fell and hit his elbow. He has a bruise but no residual problems with his elbow and he did not pass out. He did check his blood pressure and it was normal. Objective brought a blood pressure diary for the last 4 days and they range from 105/56-127/62.    The patient currently denies nausea, vomiting, fever, orthopnea, PND, cough, congestion, abdominal pain, hematochezia, melena, lower extremity edema, claudication.  Wt Readings from Last 3 Encounters:  04/08/15 204 lb 6 oz (92.704 kg)  04/02/15 198 lb 1.9 oz (89.867 kg)  03/15/15 198 lb 14.4 oz (90.22 kg)  Past Medical History  Diagnosis Date  . Thyroid disease   . Prostate enlargement   . GERD (gastroesophageal reflux disease)   . High cholesterol   . Prostate cancer ([place])   . Anxiety   . Arthritis   . History of prolonged Q-T interval on ECG   . Personal history of digestive disease     gastric ulcer  . Gout   . Hx of Clostridium difficile infection   . Hx of umbilical hernia repair   . Skin cancer     basal cell carcinoma  . CAD (coronary artery disease)     a. 02/2015: DES to mid-LAD    Current Outpatient  Prescriptions  Medication Sig Dispense Refill  . aspirin 81 MG chewable tablet Chew 1 tablet (81 mg total) by mouth daily.    . clobetasol ointment (TEMOVATE) [id number] % Apply 1 application topically daily.  1  . dutasteride (AVODART) 0.5 MG capsule Take 1 capsule (0.5 mg total) by mouth every evening. 30 capsule 5  . fluticasone (VERAMYST) 27.5 MCG/SPRAY nasal spray Place 1 spray into both nostrils daily.     [name] levothyroxine (SYNTHROID, LEVOTHROID) 75 MCG tablet Take 75 mcg by mouth daily before breakfast.    . lisinopril (PRINIVIL,ZESTRIL) 10 MG tablet Take 10 mg by mouth daily.    . metoprolol tartrate (LOPRESSOR) 25 MG tablet Take 1 tablet (25 mg total) by mouth 3 (three) times daily. 90 tablet 6  . nitroGLYCERIN (NITROSTAT) 0.4 MG SL tablet DISSOLVE 1 TABLET UNDER TONGUE EVERY 5 MINUTES FOR 3 DOSES AS NEEDED CHEST PAIN. IF NO RELIEF CALL 911 75 tablet 2  . omeprazole (PRILOSEC) 20 MG capsule Take 20 mg by mouth daily.    . Pitavastatin Calcium (LIVALO) 4 MG TABS Take 4 mg by mouth daily.    . ticagrelor (BRILINTA) 90 MG TABS tablet Take 1 tablet (90 mg total) by mouth 2 (two) times daily. 60 tablet 10   No current facility-administered medications for this visit.    Allergies:    Allergies  Allergen Reactions  . Benadryl [Diphenhydramine Hcl] Swelling  . Darvocet [Propoxyphene N-Acetaminophen]     hallucination  . Darvon [Propoxyphene Hcl]     Hallucination     Social History:  The patient  reports that he has never smoked. He has never used smokeless tobacco. He reports that he does not drink alcohol or use illicit drugs.   Family history:   Family History  Problem Relation Age of Onset  . Cancer Mother   . Cancer Father     prostate  . Cancer Sister     ROS:  Please see the history of present illness.  All other systems reviewed and negative.   PHYSICAL EXAM: VS:  BP 132/64 mmHg  Pulse 66  Ht 6\' 1"  (1.854 m)  Wt 204 lb 6 oz (92.704 kg)  BMI 26.97 kg/m2 Well  nourished, well developed, in no acute distress HEENT: Pupils are equal round react to light accommodation extraocular movements are intact.  Neck: no JVDNo cervical lymphadenopathy. Cardiac: Regular rate and rhythm without murmurs rubs or gallops. Lungs:  clear to auscultation bilaterally, no wheezing, rhonchi or rales Abd: soft, nontender, positive bowel sounds all quadrants, no hepatosplenomegaly Ext: no lower extremity edema.  2+ radial and dorsalis pedis pulses. Skin: warm and dry Neuro:  Grossly normal  EKG:  Sinus rhythm. Rates it 5 bpm, first-degree AV block, trigeminy/frequent PVCs visit T-wave inversion V1,2    ASSESSMENT AND PLAN:  Problem List Items Addressed This Visit    Palpitations   Hyperlipidemia   Essential hypertension   Dyspnea   Coronary artery disease   Chest pain    Other Visit Diagnoses    Chest tightness    -  Primary    Relevant Orders    EKG 12-Lead    Myocardial Perfusion Imaging    SOB (shortness of breath)        Relevant Orders    EKG 12-Lead    Myocardial Perfusion Imaging       Patient continues to have palpitations. He does have frequent PVCs however, not sure if they're to the extent that they would caused his severe fatigue, dyspnea. He is also having chest tightness which he quantifies as 6 out of 10 in intensity; worse on treadmill. It resolves about 30 minutes after she ceases exercise. He is status post stents to the proximal and mid LAD back in November and has 70% residual circumflex and obtuse marginal branches. It is interesting that he felt so good for the first 20 days and then had a change.  I'll continue his metoprolol 3 times a day will get a [employer]. Thyroid studies were normal as was CBC.he can take in the Brilinta as prescribed. Continue Statin as well as lisinopril.

## 2015-04-08 NOTE — Patient Instructions (Signed)
Your physician has requested that you have a lexiscan myoview. For further information please visit [email or web address]. Please follow instruction sheet, as given.  Your physician recommends that you schedule a follow-up appointment in: NEXT AVAILABLE WITH DR. Gwenlyn Found FOLLOWING THE STRESS TEST

## 2015-04-09 ENCOUNTER — Telehealth (HOSPITAL_COMMUNITY): Payer: Self-pay

## 2015-04-09 NOTE — Telephone Encounter (Signed)
Encounter complete. 

## 2015-04-13 ENCOUNTER — Inpatient Hospital Stay (HOSPITAL_COMMUNITY): Admission: RE | Admit: 2015-04-13 | Payer: Medicare Other | Source: Ambulatory Visit

## 2015-04-14 ENCOUNTER — Ambulatory Visit (HOSPITAL_COMMUNITY)
Admission: RE | Admit: 2015-04-14 | Discharge: 2015-04-14 | Disposition: A | Discharge status: Home / Self Care | Payer: Medicare Other | Source: Ambulatory Visit | Attending: Internal Medicine | Admitting: Internal Medicine

## 2015-04-14 ENCOUNTER — Telehealth: Payer: Self-pay | Admitting: *Deleted

## 2015-04-14 DIAGNOSIS — R0602 Shortness of breath: Secondary | ICD-10-CM

## 2015-04-14 DIAGNOSIS — R5383 Other fatigue: Secondary | ICD-10-CM | POA: Insufficient documentation

## 2015-04-14 DIAGNOSIS — R0789 Other chest pain: Secondary | ICD-10-CM | POA: Diagnosis not present

## 2015-04-14 DIAGNOSIS — R42 Dizziness and giddiness: Secondary | ICD-10-CM | POA: Diagnosis not present

## 2015-04-14 DIAGNOSIS — R0609 Other forms of dyspnea: Secondary | ICD-10-CM | POA: Diagnosis not present

## 2015-04-14 DIAGNOSIS — I10 Essential (primary) hypertension: Secondary | ICD-10-CM | POA: Insufficient documentation

## 2015-04-14 DIAGNOSIS — R002 Palpitations: Secondary | ICD-10-CM | POA: Diagnosis not present

## 2015-04-14 LAB — MYOCARDIAL PERFUSION IMAGING
CSEPPHR: 82 {beats}/min
NUC STRESS TID: 1.37
Rest HR: 60 {beats}/min
SDS: 1
SRS: 4
SSS: 4

## 2015-04-14 MED ORDER — TECHNETIUM TC 99M SESTAMIBI GENERIC - CARDIOLITE
30.6000 | Freq: Once | INTRAVENOUS | Status: AC | PRN
  Administered 2015-04-14: 31 via INTRAVENOUS

## 2015-04-14 MED ORDER — REGADENOSON 0.4 MG/5ML IV SOLN
0.4000 mg | Freq: Once | INTRAVENOUS | Status: AC
  Administered 2015-04-14: 0.4 mg via INTRAVENOUS

## 2015-04-14 MED ORDER — TECHNETIUM TC 99M SESTAMIBI GENERIC - CARDIOLITE
10.3000 | Freq: Once | INTRAVENOUS | Status: AC | PRN
  Administered 2015-04-14: 10 via INTRAVENOUS

## 2015-04-14 NOTE — Telephone Encounter (Signed)
-----   Message from Brett Canales, PA-C sent at 04/14/2015  3:28 PM EST ----- Please let the patient know his stress test was low risk.  Thanks, Tarri Fuller, [facility]

## 2015-04-14 NOTE — Telephone Encounter (Signed)
Called pt, per Tarri Fuller, PA-C, to inform him that his stress test was a low risk.  Pt verbalized understanding.

## 2015-04-22 ENCOUNTER — Telehealth: Payer: Self-pay | Admitting: Adult Health

## 2015-04-22 NOTE — Telephone Encounter (Signed)
I briefly spoke with [name] to make him aware of the survivorship program and how I may be involved in his care. He tells me that aside from his cardiac issues with his recent heart attack, he is doing very well.  Given that he is largely without complaints and cardiology is seeing him very often, he prefers to have me mail him a copy of his Survivorship Care Plan (SCP) in lieu of an in-person visit.  I encouraged him to call me with any questions or concerns, as I am happy to help in his continued journey as a cancer survivor.  He voiced understanding and appreciation for the call.  I will work on completing his SCP and get it mailed to him in the coming weeks.    Daniel Craze, NP [place] [phone]

## 2015-05-06 DIAGNOSIS — H26491 Other secondary cataract, right eye: Secondary | ICD-10-CM | POA: Diagnosis not present

## 2015-05-06 DIAGNOSIS — H2640 Unspecified secondary cataract: Secondary | ICD-10-CM | POA: Diagnosis not present

## 2015-05-10 DIAGNOSIS — I10 Essential (primary) hypertension: Secondary | ICD-10-CM | POA: Diagnosis not present

## 2015-05-10 DIAGNOSIS — E785 Hyperlipidemia, unspecified: Secondary | ICD-10-CM | POA: Diagnosis not present

## 2015-05-10 DIAGNOSIS — C61 Malignant neoplasm of prostate: Secondary | ICD-10-CM | POA: Diagnosis not present

## 2015-05-10 DIAGNOSIS — I2510 Atherosclerotic heart disease of native coronary artery without angina pectoris: Secondary | ICD-10-CM | POA: Diagnosis not present

## 2015-05-10 DIAGNOSIS — D510 Vitamin B12 deficiency anemia due to intrinsic factor deficiency: Secondary | ICD-10-CM | POA: Diagnosis not present

## 2015-05-10 DIAGNOSIS — B370 Candidal stomatitis: Secondary | ICD-10-CM | POA: Diagnosis not present

## 2015-05-10 DIAGNOSIS — R739 Hyperglycemia, unspecified: Secondary | ICD-10-CM | POA: Diagnosis not present

## 2015-05-10 DIAGNOSIS — Z6827 Body mass index (BMI) 27.0-27.9, adult: Secondary | ICD-10-CM | POA: Diagnosis not present

## 2015-05-13 ENCOUNTER — Encounter: Payer: Self-pay | Admitting: Adult Health

## 2015-05-13 DIAGNOSIS — H2640 Unspecified secondary cataract: Secondary | ICD-10-CM | POA: Diagnosis not present

## 2015-05-13 DIAGNOSIS — H26492 Other secondary cataract, left eye: Secondary | ICD-10-CM | POA: Diagnosis not present

## 2015-05-13 NOTE — Progress Notes (Signed)
The Survivorship Care Plan was mailed to Daniel Herrera as he reported not being able to come in to the Survivorship Clinic for an in-person visit at this time. A letter was mailed to him outlining the purpose of the content of the care plan, as well as encouraging him to reach out to me with any questions or concerns.  My business card was provided.  Additional resources regarding healthy eating, recommendations for exercise, [employer] program, and brochures for support services were also included in the mailed packet.  A shorter version of the care plan was also routed/faxed/mailed to [facility], NP, the patient's PCP.  I will not be placing any follow-up appointments to the Survivorship Clinic for Daniel Herrera, but I am happy to see him at any time in the future for any survivorship concerns that may arise. Thank you for allowing me to participate in his care!  Mike Craze, NP [place] [phone]

## 2015-05-21 ENCOUNTER — Ambulatory Visit: Payer: Medicare Other | Admitting: Cardiovascular Disease

## 2015-05-22 ENCOUNTER — Emergency Department (HOSPITAL_COMMUNITY): Payer: Medicare Other

## 2015-05-22 ENCOUNTER — Encounter (HOSPITAL_COMMUNITY): Payer: Self-pay | Admitting: Nurse Practitioner

## 2015-05-22 ENCOUNTER — Inpatient Hospital Stay (HOSPITAL_COMMUNITY)
Admission: EM | Admit: 2015-05-22 | Discharge: 2015-05-25 | DRG: 392 | Disposition: A | Discharge status: Home / Self Care | Payer: Medicare Other | Attending: Internal Medicine | Admitting: Internal Medicine

## 2015-05-22 DIAGNOSIS — I5022 Chronic systolic (congestive) heart failure: Secondary | ICD-10-CM | POA: Diagnosis present

## 2015-05-22 DIAGNOSIS — Z8546 Personal history of malignant neoplasm of prostate: Secondary | ICD-10-CM | POA: Diagnosis present

## 2015-05-22 DIAGNOSIS — N179 Acute kidney failure, unspecified: Secondary | ICD-10-CM | POA: Diagnosis present

## 2015-05-22 DIAGNOSIS — A041 Enterotoxigenic Escherichia coli infection: Secondary | ICD-10-CM

## 2015-05-22 DIAGNOSIS — E039 Hypothyroidism, unspecified: Secondary | ICD-10-CM | POA: Diagnosis present

## 2015-05-22 DIAGNOSIS — I2510 Atherosclerotic heart disease of native coronary artery without angina pectoris: Secondary | ICD-10-CM | POA: Diagnosis present

## 2015-05-22 DIAGNOSIS — C61 Malignant neoplasm of prostate: Secondary | ICD-10-CM | POA: Diagnosis not present

## 2015-05-22 DIAGNOSIS — R197 Diarrhea, unspecified: Secondary | ICD-10-CM | POA: Diagnosis present

## 2015-05-22 DIAGNOSIS — K529 Noninfective gastroenteritis and colitis, unspecified: Principal | ICD-10-CM | POA: Diagnosis present

## 2015-05-22 DIAGNOSIS — A047 Enterocolitis due to Clostridium difficile: Secondary | ICD-10-CM | POA: Diagnosis present

## 2015-05-22 DIAGNOSIS — K4020 Bilateral inguinal hernia, without obstruction or gangrene, not specified as recurrent: Secondary | ICD-10-CM | POA: Diagnosis present

## 2015-05-22 DIAGNOSIS — B9620 Unspecified Escherichia coli [E. coli] as the cause of diseases classified elsewhere: Secondary | ICD-10-CM | POA: Diagnosis not present

## 2015-05-22 DIAGNOSIS — K5730 Diverticulosis of large intestine without perforation or abscess without bleeding: Secondary | ICD-10-CM | POA: Diagnosis present

## 2015-05-22 DIAGNOSIS — R1084 Generalized abdominal pain: Secondary | ICD-10-CM | POA: Diagnosis not present

## 2015-05-22 DIAGNOSIS — Z9861 Coronary angioplasty status: Secondary | ICD-10-CM

## 2015-05-22 DIAGNOSIS — E119 Type 2 diabetes mellitus without complications: Secondary | ICD-10-CM | POA: Diagnosis present

## 2015-05-22 DIAGNOSIS — K449 Diaphragmatic hernia without obstruction or gangrene: Secondary | ICD-10-CM | POA: Diagnosis present

## 2015-05-22 DIAGNOSIS — Z85828 Personal history of other malignant neoplasm of skin: Secondary | ICD-10-CM

## 2015-05-22 DIAGNOSIS — R002 Palpitations: Secondary | ICD-10-CM | POA: Diagnosis present

## 2015-05-22 DIAGNOSIS — A498 Other bacterial infections of unspecified site: Secondary | ICD-10-CM | POA: Diagnosis present

## 2015-05-22 DIAGNOSIS — E118 Type 2 diabetes mellitus with unspecified complications: Secondary | ICD-10-CM | POA: Diagnosis present

## 2015-05-22 LAB — COMPREHENSIVE METABOLIC PANEL
ALK PHOS: 76 U/L (ref 38–126)
ALT: 32 U/L (ref 17–63)
ANION GAP: 14 (ref 5–15)
AST: 27 U/L (ref 15–41)
Albumin: 3.6 g/dL (ref 3.5–5.0)
BUN: 20 mg/dL (ref 6–20)
CALCIUM: 9 mg/dL (ref 8.9–10.3)
CO2: 19 mmol/L — AB (ref 22–32)
Chloride: 104 mmol/L (ref 101–111)
Creatinine, Ser: 1.49 mg/dL — ABNORMAL HIGH (ref 0.61–1.24)
GFR calc non Af Amer: 44 mL/min — ABNORMAL LOW (ref >60)
GFR, EST AFRICAN AMERICAN: 51 mL/min — AB (ref >60)
Glucose, Bld: 282 mg/dL — ABNORMAL HIGH (ref 65–99)
POTASSIUM: 4.4 mmol/L (ref 3.5–5.1)
SODIUM: 137 mmol/L (ref 135–145)
TOTAL PROTEIN: 5.9 g/dL — AB (ref 6.5–8.1)
Total Bilirubin: 0.6 mg/dL (ref 0.3–1.2)

## 2015-05-22 LAB — I-STAT TROPONIN, ED
Troponin i, poc: 0.01 ng/mL (ref 0.00–0.08)
Troponin i, poc: 0.01 ng/mL (ref 0.00–0.08)

## 2015-05-22 LAB — CBC
HCT: 49.6 % (ref 39.0–52.0)
HEMOGLOBIN: 17 g/dL (ref 13.0–17.0)
MCH: 31.4 pg (ref 26.0–34.0)
MCHC: 34.3 g/dL (ref 30.0–36.0)
MCV: 91.7 fL (ref 78.0–100.0)
Platelets: 181 10*3/uL (ref 150–400)
RBC: 5.41 MIL/uL (ref 4.22–5.81)
RDW: 13 % (ref 11.5–15.5)
WBC: 10.5 10*3/uL (ref 4.0–10.5)

## 2015-05-22 LAB — URINALYSIS, ROUTINE W REFLEX MICROSCOPIC
Bilirubin Urine: NEGATIVE
Glucose, UA: NEGATIVE mg/dL
Hgb urine dipstick: NEGATIVE
Ketones, ur: NEGATIVE mg/dL
Leukocytes, UA: NEGATIVE
NITRITE: NEGATIVE
Protein, ur: NEGATIVE mg/dL
SPECIFIC GRAVITY, URINE: 1.016 (ref 1.005–1.030)
pH: 5.5 (ref 5.0–8.0)

## 2015-05-22 LAB — LIPASE, BLOOD: Lipase: 24 U/L (ref 11–51)

## 2015-05-22 MED ORDER — ONDANSETRON HCL 4 MG/2ML IJ SOLN
4.0000 mg | Freq: Once | INTRAMUSCULAR | Status: AC
  Administered 2015-05-22: 4 mg via INTRAVENOUS
  Filled 2015-05-22: qty 2

## 2015-05-22 MED ORDER — IOHEXOL 300 MG/ML  SOLN
100.0000 mL | Freq: Once | INTRAMUSCULAR | Status: AC | PRN
  Administered 2015-05-22: 80 mL via INTRAVENOUS

## 2015-05-22 MED ORDER — MORPHINE SULFATE (PF) 4 MG/ML IV SOLN
4.0000 mg | Freq: Once | INTRAVENOUS | Status: AC
Start: 2015-05-22 — End: 2015-05-22
  Administered 2015-05-22: 4 mg via INTRAVENOUS
  Filled 2015-05-22: qty 1

## 2015-05-22 MED ORDER — SODIUM CHLORIDE 0.9 % IV BOLUS (SEPSIS)
1000.0000 mL | Freq: Once | INTRAVENOUS | Status: AC
  Administered 2015-05-22: 1000 mL via INTRAVENOUS

## 2015-05-22 MED ORDER — SODIUM CHLORIDE 0.9 % IV SOLN
INTRAVENOUS | Status: DC
  Administered 2015-05-22 – 2015-05-24 (×2): via INTRAVENOUS

## 2015-05-22 NOTE — ED Notes (Signed)
Pt was here last week for stent placement, he states hes had some intermittent chest pain since. Today, he began to haVe abd pain, n/v/d. Just pta, Took zofran with relief of vomiting but not the nausea/ A&Ox4, resp e/u

## 2015-05-22 NOTE — ED Notes (Signed)
Dr.Campos to triage to speak with pt.

## 2015-05-22 NOTE — ED Notes (Signed)
Spoke with Lab advised they would add Lipase to labs already sent.

## 2015-05-22 NOTE — ED Provider Notes (Signed)
CSN: [id number]     Arrival date & time 05/22/15  1834 History   First MD Initiated Contact with Patient 05/22/15 1950     Chief Complaint  Patient presents with  . Abdominal Pain    HPI The patient presented to the emergency room for evaluation of vomiting diarrhea and abdominal pain. The patient started having trouble with diarrhea this past week. He has had several episodes per day. However in the last day those episodes of this increased significantly. He is having loose stools approximately every hour. She also started having trouble with vomiting and nausea. He has vomited several times today. Associated with this vomiting is had diffuse abdominal pain as well as pain radiating up into his chest.  Patient does have a history of heart disease. He has been having chest pain ever since he had a stent placed in November. This chest pain today is not than those previous episodes.  He has a history of C. difficile denies any recent antibiotics. He has had prior abdominal surgery. Past Medical History  Diagnosis Date  . Thyroid disease   . Prostate enlargement   . GERD (gastroesophageal reflux disease)   . High cholesterol   . Prostate cancer ([place])   . Anxiety   . Arthritis   . History of prolonged Q-T interval on ECG   . Personal history of digestive disease     gastric ulcer  . Gout   . Hx of Clostridium difficile infection   . Hx of umbilical hernia repair   . Skin cancer     basal cell carcinoma  . CAD (coronary artery disease)     a. 02/2015: DES to mid-LAD   Past Surgical History  Procedure Laterality Date  . Back surgery    . Nasal sinus surgery    . Appendectomy    . Prostate biopsy    . Cataract extraction, bilateral    . Right shoulder rotator cuff repair    . Cardiac catheterization N/A 02/21/2015    Procedure: Left Heart Cath;  Surgeon: Lorretta Harp, MD;  Location: [facility];  Service: Cardiovascular;  Laterality: N/A;   Family History  Problem  Relation Age of Onset  . Cancer Mother   . Cancer Father     prostate  . Cancer Sister    Social History  Substance Use Topics  . Smoking status: Never Smoker   . Smokeless tobacco: Never Used  . Alcohol Use: No    Review of Systems  All other systems reviewed and are negative.     Allergies  Benadryl; Darvocet; Darvon; and Tylenol  Home Medications   Prior to Admission medications   Medication Sig Start Date End Date Taking? Authorizing Provider  aspirin 81 MG chewable tablet Chew 1 tablet (81 mg total) by mouth daily. 02/24/15  Yes Erma Heritage, PA  clobetasol ointment (TEMOVATE) [id number] % Apply 1 application topically daily as needed (cellulitis on legs).  01/26/15  Yes Historical Provider, MD  dutasteride (AVODART) 0.5 MG capsule Take 1 capsule (0.5 mg total) by mouth every evening. Patient taking differently: Take 0.5 mg by mouth at bedtime.  11/26/14  Yes [name], MD  fluticasone (VERAMYST) 27.5 MCG/SPRAY nasal spray Place 1 spray into both nostrils daily.    Yes Historical Provider, MD  levothyroxine (SYNTHROID, LEVOTHROID) 75 MCG tablet Take 75 mcg by mouth daily before breakfast.   Yes Historical Provider, MD  lisinopril (PRINIVIL,ZESTRIL) 10 MG tablet Take 10 mg by  mouth daily.   Yes Historical Provider, MD  metoprolol tartrate (LOPRESSOR) 25 MG tablet Take 1 tablet (25 mg total) by mouth 3 (three) times daily. Patient taking differently: Take 25 mg by mouth 2 (two) times daily.  04/02/15  Yes Brittainy Erie Noe, PA-C  Naproxen Sod-Diphenhydramine (ALEVE PM PO) Take 1 tablet by mouth at bedtime as needed (sleep).   Yes Historical Provider, MD  naproxen sodium (ALEVE) 220 MG tablet Take 220 mg by mouth daily as needed (pain).   Yes Historical Provider, MD  nitroGLYCERIN (NITROSTAT) 0.4 MG SL tablet DISSOLVE 1 TABLET UNDER TONGUE EVERY 5 MINUTES FOR 3 DOSES AS NEEDED CHEST PAIN. IF NO RELIEF CALL 911 03/03/15  Yes Brittainy Erie Noe, PA-C  nystatin  (MYCOSTATIN) 100000 UNIT/ML suspension Take 2.5 mLs by mouth at bedtime as needed (thrush). Swish and spit 05/10/15  Yes Historical Provider, MD  omeprazole (PRILOSEC) 20 MG capsule Take 20 mg by mouth 2 (two) times daily before a meal.    Yes Historical Provider, MD  Pitavastatin Calcium (LIVALO) 4 MG TABS Take 4 mg by mouth at bedtime.    Yes Historical Provider, MD  Polyethyl Glycol-Propyl Glycol (SYSTANE OP) Place 1 drop into both eyes 2 (two) times daily.   Yes Historical Provider, MD  ticagrelor (BRILINTA) 90 MG TABS tablet Take 1 tablet (90 mg total) by mouth 2 (two) times daily. 02/24/15  Yes Erma Heritage, PA  metFORMIN (GLUCOPHAGE-XR) 500 MG 24 hr tablet Take 500 mg by mouth daily as needed (take with the evening meal if CBG >120).  05/11/15   Historical Provider, MD   BP 120/95 mmHg  Pulse 90  Temp(Src) 97.8 F (36.6 C) (Oral)  Resp 14  SpO2 98% Physical Exam  Constitutional: He appears well-developed and well-nourished. No distress.  HENT:  Head: Normocephalic and atraumatic.  Right Ear: External ear normal.  Left Ear: External ear normal.  Eyes: Conjunctivae are normal. Right eye exhibits no discharge. Left eye exhibits no discharge. No scleral icterus.  Neck: Neck supple. No tracheal deviation present.  Cardiovascular: Normal rate, regular rhythm and intact distal pulses.   Pulmonary/Chest: Effort normal and breath sounds normal. No stridor. No respiratory distress. He has no wheezes. He has no rales.  Abdominal: Soft. He exhibits no distension and no mass. Bowel sounds are decreased. There is generalized tenderness. There is no rigidity, no rebound and no guarding. No hernia.  Musculoskeletal: He exhibits no edema or tenderness.  Neurological: He is alert. He has normal strength. No cranial nerve deficit (no facial droop, extraocular movements intact, no slurred speech) or sensory deficit. He exhibits normal muscle tone. He displays no seizure activity. Coordination normal.   Skin: Skin is warm and dry. No rash noted.  Psychiatric: He has a normal mood and affect.  Nursing note and vitals reviewed.   ED Course  Procedures (including critical care time) Labs Review Labs Reviewed  COMPREHENSIVE METABOLIC PANEL - Abnormal; Notable for the following:    CO2 19 (*)    Glucose, Bld 282 (*)    Creatinine, Ser 1.49 (*)    Total Protein 5.9 (*)    GFR calc non Af Amer 44 (*)    GFR calc Af Amer 51 (*)    All other components within normal limits  C DIFFICILE QUICK SCREEN W PCR REFLEX  CBC  URINALYSIS, ROUTINE W REFLEX MICROSCOPIC (NOT AT [facility])  LIPASE, BLOOD  I-STAT TROPOININ, ED  I-STAT TROPOININ, ED    Imaging Review Ct  Abdomen Pelvis W Contrast  05/23/2015  CLINICAL DATA:  Abdominal pain and diarrhea for 2 weeks, increasing over the last week. Coronary stent placed last week. EXAM: CT ABDOMEN AND PELVIS WITH CONTRAST TECHNIQUE: Multidetector CT imaging of the abdomen and pelvis was performed using the standard protocol following bolus administration of intravenous contrast. CONTRAST:  43mL OMNIPAQUE IOHEXOL 300 MG/ML  SOLN COMPARISON:  CT pelvis 07/20/2014. CT abdomen and pelvis 10/27/2012. FINDINGS: Mild dependent changes in lung bases. Small esophageal hiatal hernia. Diffuse fatty infiltration of the liver. No focal liver lesions. The gallbladder, pancreas, spleen, adrenal glands, abdominal aorta, inferior vena cava, and retroperitoneal lymph nodes are unremarkable. Multiple parapelvic cysts in the kidneys. No hydronephrosis or hydroureter. Renal nephrograms are symmetrical. Stomach and small bowel are mostly decompressed. Colon is mostly decompressed but is partially filled with fluid suggesting possible colitis. Free fluid demonstrated throughout the upper abdomen, percolating through the mesentery, along the pericolic gutters, and into the pelvis. This is likely ascites although reactive fluid could have this appearance as well. No free air in the abdomen.  Prominent visceral adipose tissues. Pelvis: The appendix is not identified. There is a stone in the bladder which was present previously. Mild bladder wall thickening possibly indicating cystitis. Left posterior bladder diverticulum. Prostate gland measures 3.6 cm diameter without significant enlargement. Scattered calcifications in the prostate. Prostate seed implants. No pelvic lymphadenopathy. Diverticulosis of the sigmoid colon. No obvious inflammatory changes of diverticulitis. Fluid along the sigmoid colon is probably related to the diffuse free fluid. Small right inguinal hernia containing fat and fluid. Small left inguinal hernia containing fat. Degenerative changes at the lumbosacral interspace. No destructive or sclerotic bone lesions appreciated. IMPRESSION: Decompressed but fluid-filled colon suggesting colitis. Diverticulosis of the sigmoid colon. No definite evidence of diverticulitis. Mild diffuse free fluid in the abdomen and pelvis may represent ascites or reactive fluid. No free air. Small esophageal hiatal hernia. Mild bladder wall thickening may indicate cystitis. Small bladder diverticulum. Parapelvic renal cysts. Bilateral inguinal hernias. Electronically Signed   By: Lucienne Capers M.D.   On: 05/23/2015 00:32   Dg Abd Acute W/chest  05/22/2015  CLINICAL DATA:  Generalized abdominal pain and diarrhea 2 weeks. Vomiting last night. EXAM: DG ABDOMEN ACUTE W/ 1V CHEST COMPARISON:  Chest x-ray 02/21/2015 and KUB 10/14/2014 FINDINGS: Lungs are adequately inflated without consolidation or effusion. Mild chronic changes over the lung apices. Cardiomediastinal silhouette and remainder of the chest is unchanged. Abdominal images demonstrate a relative paucity of bowel gas with mild fecal retention throughout the colon. No free peritoneal air. Minimal degenerate change of the spine and hips. IMPRESSION: Nonobstructive bowel gas pattern. No acute cardiopulmonary disease. Electronically Signed   By:  Marin Olp M.D.   On: 05/22/2015 21:07   I have personally reviewed and evaluated these images and lab results as part of my medical decision-making.   EKG Interpretation   Date/Time:  Saturday May 22 2015 18:51:29 EST Ventricular Rate:  119 PR Interval:  156 QRS Duration: 88 QT Interval:  328 QTC Calculation: 461 R Axis:   4 Text Interpretation:  Sinus tachycardia Left ventricular hypertrophy with  repolarization abnormality ST elevation consider inferior injury or acute  infarct Consider right ventricular involvement in acute inferior infarct  Abnormal ECG st elevation increased from prior tracing Reconfirmed by  Jhordan Kinter  MD-J, Rin Gorton [id number]) on 05/22/2015 8:18:31 PM      MDM   Final diagnoses:  Colitis    EKG does show changes.  First troponin normal.  Discussed case with Dr Scarlette Calico.  Does not feel EKG shows definite STEMI.    Will continue to evaluate  CT scan demonstrates colitis.  Free fluid noted in the abdomen suggestive of ascites or reactive fluid.  No evidence of perforation.  Pt has been treated with IV fluids, start abx.  Admit for further treatment.    Dorie Rank, MD 05/23/15 [phone]

## 2015-05-23 ENCOUNTER — Encounter (HOSPITAL_COMMUNITY): Payer: Self-pay | Admitting: *Deleted

## 2015-05-23 DIAGNOSIS — B9620 Unspecified Escherichia coli [E. coli] as the cause of diseases classified elsewhere: Secondary | ICD-10-CM | POA: Diagnosis present

## 2015-05-23 DIAGNOSIS — R197 Diarrhea, unspecified: Secondary | ICD-10-CM | POA: Diagnosis present

## 2015-05-23 DIAGNOSIS — K449 Diaphragmatic hernia without obstruction or gangrene: Secondary | ICD-10-CM | POA: Diagnosis present

## 2015-05-23 DIAGNOSIS — C61 Malignant neoplasm of prostate: Secondary | ICD-10-CM | POA: Diagnosis present

## 2015-05-23 DIAGNOSIS — I2510 Atherosclerotic heart disease of native coronary artery without angina pectoris: Secondary | ICD-10-CM | POA: Diagnosis not present

## 2015-05-23 DIAGNOSIS — E118 Type 2 diabetes mellitus with unspecified complications: Secondary | ICD-10-CM | POA: Diagnosis present

## 2015-05-23 DIAGNOSIS — Z85828 Personal history of other malignant neoplasm of skin: Secondary | ICD-10-CM | POA: Diagnosis not present

## 2015-05-23 DIAGNOSIS — E119 Type 2 diabetes mellitus without complications: Secondary | ICD-10-CM | POA: Diagnosis present

## 2015-05-23 DIAGNOSIS — K529 Noninfective gastroenteritis and colitis, unspecified: Secondary | ICD-10-CM | POA: Diagnosis not present

## 2015-05-23 DIAGNOSIS — K4020 Bilateral inguinal hernia, without obstruction or gangrene, not specified as recurrent: Secondary | ICD-10-CM | POA: Diagnosis present

## 2015-05-23 DIAGNOSIS — E039 Hypothyroidism, unspecified: Secondary | ICD-10-CM | POA: Diagnosis present

## 2015-05-23 DIAGNOSIS — I5022 Chronic systolic (congestive) heart failure: Secondary | ICD-10-CM | POA: Diagnosis not present

## 2015-05-23 DIAGNOSIS — Z8546 Personal history of malignant neoplasm of prostate: Secondary | ICD-10-CM | POA: Diagnosis present

## 2015-05-23 DIAGNOSIS — A498 Other bacterial infections of unspecified site: Secondary | ICD-10-CM | POA: Diagnosis not present

## 2015-05-23 DIAGNOSIS — A041 Enterotoxigenic Escherichia coli infection: Secondary | ICD-10-CM | POA: Diagnosis not present

## 2015-05-23 DIAGNOSIS — K5730 Diverticulosis of large intestine without perforation or abscess without bleeding: Secondary | ICD-10-CM | POA: Diagnosis present

## 2015-05-23 DIAGNOSIS — N179 Acute kidney failure, unspecified: Secondary | ICD-10-CM | POA: Diagnosis not present

## 2015-05-23 DIAGNOSIS — A047 Enterocolitis due to Clostridium difficile: Secondary | ICD-10-CM | POA: Diagnosis present

## 2015-05-23 DIAGNOSIS — Z9861 Coronary angioplasty status: Secondary | ICD-10-CM

## 2015-05-23 HISTORY — DX: Noninfective gastroenteritis and colitis, unspecified: K52.9

## 2015-05-23 LAB — COMPREHENSIVE METABOLIC PANEL
ALBUMIN: 3 g/dL — AB (ref 3.5–5.0)
ALT: 24 U/L (ref 17–63)
AST: 21 U/L (ref 15–41)
Alkaline Phosphatase: 60 U/L (ref 38–126)
Anion gap: 8 (ref 5–15)
BUN: 22 mg/dL — AB (ref 6–20)
CHLORIDE: 108 mmol/L (ref 101–111)
CO2: 23 mmol/L (ref 22–32)
CREATININE: 1.44 mg/dL — AB (ref 0.61–1.24)
Calcium: 8.6 mg/dL — ABNORMAL LOW (ref 8.9–10.3)
GFR calc Af Amer: 53 mL/min — ABNORMAL LOW (ref >60)
GFR, EST NON AFRICAN AMERICAN: 46 mL/min — AB (ref >60)
GLUCOSE: 197 mg/dL — AB (ref 65–99)
POTASSIUM: 4.8 mmol/L (ref 3.5–5.1)
SODIUM: 139 mmol/L (ref 135–145)
Total Bilirubin: 0.5 mg/dL (ref 0.3–1.2)
Total Protein: 4.7 g/dL — ABNORMAL LOW (ref 6.5–8.1)

## 2015-05-23 LAB — C DIFFICILE QUICK SCREEN W PCR REFLEX
C DIFFICILE (CDIFF) TOXIN: NEGATIVE
C Diff antigen: NEGATIVE
C Diff interpretation: NEGATIVE

## 2015-05-23 LAB — LIPID PANEL
CHOL/HDL RATIO: 3.9 ratio
Cholesterol: 116 mg/dL (ref 0–200)
HDL: 30 mg/dL — ABNORMAL LOW (ref >40)
LDL Cholesterol: 61 mg/dL (ref 0–99)
Triglycerides: 126 mg/dL (ref <150)
VLDL: 25 mg/dL (ref 0–40)

## 2015-05-23 LAB — CBC WITH DIFFERENTIAL/PLATELET
Basophils Absolute: 0 10*3/uL (ref 0.0–0.1)
Basophils Relative: 0 %
EOS ABS: 0 10*3/uL (ref 0.0–0.7)
EOS PCT: 0 %
HCT: 38.9 % — ABNORMAL LOW (ref 39.0–52.0)
HEMOGLOBIN: 12.9 g/dL — AB (ref 13.0–17.0)
LYMPHS ABS: 0.5 10*3/uL — AB (ref 0.7–4.0)
LYMPHS PCT: 10 %
MCH: 30.6 pg (ref 26.0–34.0)
MCHC: 33.2 g/dL (ref 30.0–36.0)
MCV: 92.4 fL (ref 78.0–100.0)
MONOS PCT: 6 %
Monocytes Absolute: 0.3 10*3/uL (ref 0.1–1.0)
Neutro Abs: 4.4 10*3/uL (ref 1.7–7.7)
Neutrophils Relative %: 85 %
PLATELETS: 137 10*3/uL — AB (ref 150–400)
RBC: 4.21 MIL/uL — AB (ref 4.22–5.81)
RDW: 13.2 % (ref 11.5–15.5)
WBC: 5.2 10*3/uL (ref 4.0–10.5)

## 2015-05-23 LAB — GLUCOSE, CAPILLARY
GLUCOSE-CAPILLARY: 112 mg/dL — AB (ref 65–99)
Glucose-Capillary: 93 mg/dL (ref 65–99)
Glucose-Capillary: 98 mg/dL (ref 65–99)
Glucose-Capillary: 90 mg/dL (ref 65–99)

## 2015-05-23 LAB — OCCULT BLOOD X 1 CARD TO LAB, STOOL: Fecal Occult Bld: POSITIVE — AB

## 2015-05-23 MED ORDER — PANTOPRAZOLE SODIUM 40 MG IV SOLR
40.0000 mg | INTRAVENOUS | Status: DC
  Administered 2015-05-23 – 2015-05-24 (×2): 40 mg via INTRAVENOUS
  Filled 2015-05-23 (×2): qty 40

## 2015-05-23 MED ORDER — TICAGRELOR 90 MG PO TABS
90.0000 mg | ORAL_TABLET | Freq: Two times a day (BID) | ORAL | Status: DC
  Administered 2015-05-23 – 2015-05-25 (×5): 90 mg via ORAL
  Filled 2015-05-23 (×5): qty 1

## 2015-05-23 MED ORDER — LEVOTHYROXINE SODIUM 75 MCG PO TABS
75.0000 ug | ORAL_TABLET | Freq: Every day | ORAL | Status: DC
  Administered 2015-05-23 – 2015-05-25 (×3): 75 ug via ORAL
  Filled 2015-05-23 (×5): qty 1

## 2015-05-23 MED ORDER — ONDANSETRON HCL 4 MG/2ML IJ SOLN
4.0000 mg | Freq: Four times a day (QID) | INTRAMUSCULAR | Status: DC | PRN
  Filled 2015-05-23: qty 2

## 2015-05-23 MED ORDER — METRONIDAZOLE IN NACL 5-0.79 MG/ML-% IV SOLN
500.0000 mg | Freq: Once | INTRAVENOUS | Status: AC
  Administered 2015-05-23: 500 mg via INTRAVENOUS

## 2015-05-23 MED ORDER — METRONIDAZOLE IN NACL 5-0.79 MG/ML-% IV SOLN
500.0000 mg | Freq: Three times a day (TID) | INTRAVENOUS | Status: DC
  Administered 2015-05-23 – 2015-05-24 (×4): 500 mg via INTRAVENOUS
  Filled 2015-05-23 (×5): qty 100

## 2015-05-23 MED ORDER — ASPIRIN 81 MG PO CHEW
81.0000 mg | CHEWABLE_TABLET | Freq: Every day | ORAL | Status: DC
  Administered 2015-05-23 – 2015-05-25 (×3): 81 mg via ORAL
  Filled 2015-05-23 (×3): qty 1

## 2015-05-23 MED ORDER — METOPROLOL TARTRATE 25 MG PO TABS
25.0000 mg | ORAL_TABLET | Freq: Two times a day (BID) | ORAL | Status: DC
  Administered 2015-05-23 – 2015-05-25 (×5): 25 mg via ORAL
  Filled 2015-05-23 (×5): qty 1

## 2015-05-23 MED ORDER — CIPROFLOXACIN IN D5W 400 MG/200ML IV SOLN
400.0000 mg | Freq: Once | INTRAVENOUS | Status: AC
  Administered 2015-05-23: 400 mg via INTRAVENOUS
  Filled 2015-05-23: qty 200

## 2015-05-23 MED ORDER — LISINOPRIL 10 MG PO TABS
10.0000 mg | ORAL_TABLET | Freq: Every day | ORAL | Status: DC
  Administered 2015-05-24 – 2015-05-25 (×2): 10 mg via ORAL
  Filled 2015-05-23 (×3): qty 1

## 2015-05-23 MED ORDER — PRAVASTATIN SODIUM 20 MG PO TABS
20.0000 mg | ORAL_TABLET | Freq: Every day | ORAL | Status: DC
  Administered 2015-05-23 – 2015-05-24 (×2): 20 mg via ORAL
  Filled 2015-05-23 (×2): qty 1

## 2015-05-23 MED ORDER — ENOXAPARIN SODIUM 40 MG/0.4ML ~~LOC~~ SOLN
40.0000 mg | Freq: Every day | SUBCUTANEOUS | Status: DC
  Administered 2015-05-23 – 2015-05-25 (×3): 40 mg via SUBCUTANEOUS
  Filled 2015-05-23 (×4): qty 0.4

## 2015-05-23 MED ORDER — CIPROFLOXACIN IN D5W 400 MG/200ML IV SOLN
400.0000 mg | Freq: Two times a day (BID) | INTRAVENOUS | Status: DC
  Administered 2015-05-23 – 2015-05-24 (×3): 400 mg via INTRAVENOUS
  Filled 2015-05-23 (×3): qty 200

## 2015-05-23 MED ORDER — NITROGLYCERIN 0.4 MG SL SUBL: 0.4000 mg | SUBLINGUAL_TABLET | SUBLINGUAL | Status: DC | PRN

## 2015-05-23 MED ORDER — ONDANSETRON HCL 4 MG/2ML IJ SOLN
4.0000 mg | Freq: Once | INTRAMUSCULAR | Status: AC
  Administered 2015-05-23: 4 mg via INTRAVENOUS

## 2015-05-23 MED ORDER — INSULIN ASPART 100 UNIT/ML ~~LOC~~ SOLN
0.0000 [IU] | Freq: Three times a day (TID) | SUBCUTANEOUS | Status: DC
Start: 2015-05-23 — End: 2015-05-25
  Administered 2015-05-25: 1 [IU] via SUBCUTANEOUS

## 2015-05-23 MED ORDER — MORPHINE SULFATE (PF) 2 MG/ML IV SOLN: 2.0000 mg | INTRAVENOUS | Status: DC | PRN

## 2015-05-23 MED ORDER — DUTASTERIDE 0.5 MG PO CAPS
0.5000 mg | ORAL_CAPSULE | Freq: Every day | ORAL | Status: DC
  Administered 2015-05-23 – 2015-05-24 (×2): 0.5 mg via ORAL
  Filled 2015-05-23 (×4): qty 1

## 2015-05-23 MED ORDER — ZOLPIDEM TARTRATE 5 MG PO TABS
5.0000 mg | ORAL_TABLET | Freq: Once | ORAL | Status: AC
  Administered 2015-05-24: 5 mg via ORAL
  Filled 2015-05-23: qty 1

## 2015-05-23 NOTE — Progress Notes (Signed)
New Admission Note:   Arrival Method: Via wheelchair from the ED Mental Orientation:  A & O x 4 Telemetry: Placed on Telemetry Box #12 - NSR Assessment: Completed Skin:  Intact except for ecchymosis to bilateral arms and round bruise to top of right thigh IV:  Right AC Pain: Denies Tubes:  None Safety Measures: Safety Fall Prevention Plan has been given, discussed and signed Admission: Completed [address] [place] Orientation: Patient has been orientated to the room, unit and staff.  Family:  From home with wife  Orders have been reviewed and implemented. Will continue to monitor the patient. Call light has been placed within reach and bed alarm has been activated.   Earleen Reaper RN- [place], [place] Phone number: [phone]

## 2015-05-23 NOTE — ED Notes (Signed)
Patient up to the [facility].  More loose stool with urine

## 2015-05-23 NOTE — ED Notes (Signed)
In CT at this time

## 2015-05-23 NOTE — H&P (Signed)
History and Physical  JR KOVACICH [id number] DOB: 05-22-1940 DOA: 05/22/2015  PCP: [name], NP   Chief Complaint: diarrhea and vomiting   History of Present Illness:  Patient is a 75 yo male with history of CAD, hypothyroidism, C.diff colitis who came with cc of diarrhea for the past week, frequent, non bloody, watery, with "very bad" smell , associated with chills, and periumbilical abdominal pain but without fever. He had non bloody vomiting yesterday. He has had chronic exertional chest pain since his last cath ( scheduled to see his cardiologist next month) but he is chest pain free now. No cough, dyspnea, dysuria. No other complaints.   Review of Systems:  CONSTITUTIONAL:  No night sweats.  No fatigue, malaise, lethargy.  No fever +chills. Eyes:  No visual changes.  No eye pain.  No eye discharge.   ENT:    No epistaxis.  No sinus pain.  No sore throat.  No ear pain.  No congestion. RESPIRATORY:  No cough.  No wheeze.  No hemoptysis.  No shortness of breath. CARDIOVASCULAR:  No chest pains.  No palpitations. GASTROINTESTINAL:  +abdominal pain.  +nausea +vomiting.  +diarrhea .  No hematemesis.  No hematochezia.  No melena. GENITOURINARY:  No urgency.  No frequency.  No dysuria.  No hematuria.  No obstructive symptoms.  No discharge.  No pain.  No significant abnormal bleeding. MUSCULOSKELETAL:  No musculoskeletal pain.  No joint swelling.  No arthritis. NEUROLOGICAL:  No confusion.  No weakness. No headache. No seizure. PSYCHIATRIC:  No depression. No anxiety. No suicidal ideation. SKIN:  No rashes.  No lesions.  No wounds. ENDOCRINE:  No unexplained weight loss.  No polydipsia.  No polyuria.  No polyphagia. HEMATOLOGIC:  No anemia.  No purpura.  No petechiae.  No bleeding.  ALLERGIC AND IMMUNOLOGIC:  No pruritus.  No swelling Other:  Past Medical and Surgical History:   Past Medical History  Diagnosis Date  . Thyroid disease   . Prostate enlargement   .  GERD (gastroesophageal reflux disease)   . High cholesterol   . Prostate cancer ([place])   . Anxiety   . Arthritis   . History of prolonged Q-T interval on ECG   . Personal history of digestive disease     gastric ulcer  . Gout   . Hx of Clostridium difficile infection   . Hx of umbilical hernia repair   . Skin cancer     basal cell carcinoma  . CAD (coronary artery disease)     a. 02/2015: DES to mid-LAD   Past Surgical History  Procedure Laterality Date  . Back surgery    . Nasal sinus surgery    . Appendectomy    . Prostate biopsy    . Cataract extraction, bilateral    . Right shoulder rotator cuff repair    . Cardiac catheterization N/A 02/21/2015    Procedure: Left Heart Cath;  Surgeon: Lorretta Harp, MD;  Location: [facility];  Service: Cardiovascular;  Laterality: N/A;    Social History:   reports that he has never smoked. He has never used smokeless tobacco. He reports that he does not drink alcohol or use illicit drugs.   Allergies  Allergen Reactions  . Benadryl [Diphenhydramine Hcl] Swelling    Facial swelling from high dose (tolerates Advil PM as needed)  . Darvocet [Propoxyphene N-Acetaminophen] Other (See Comments)    hallucination  . Darvon [Propoxyphene Hcl] Other (See Comments)    Hallucination   .  Tylenol [Acetaminophen] Swelling    Foot swelling    Family History  Problem Relation Age of Onset  . Cancer Mother   . Cancer Father     prostate  . Cancer Sister       Prior to Admission medications   Medication Sig Start Date End Date Taking? Authorizing Provider  aspirin 81 MG chewable tablet Chew 1 tablet (81 mg total) by mouth daily. 02/24/15  Yes Erma Heritage, PA  clobetasol ointment (TEMOVATE) [id number] % Apply 1 application topically daily as needed (cellulitis on legs).  01/26/15  Yes Historical Provider, MD  dutasteride (AVODART) 0.5 MG capsule Take 1 capsule (0.5 mg total) by mouth every evening. Patient taking differently:  Take 0.5 mg by mouth at bedtime.  11/26/14  Yes [name], MD  fluticasone (VERAMYST) 27.5 MCG/SPRAY nasal spray Place 1 spray into both nostrils daily.    Yes Historical Provider, MD  levothyroxine (SYNTHROID, LEVOTHROID) 75 MCG tablet Take 75 mcg by mouth daily before breakfast.   Yes Historical Provider, MD  lisinopril (PRINIVIL,ZESTRIL) 10 MG tablet Take 10 mg by mouth daily.   Yes Historical Provider, MD  metoprolol tartrate (LOPRESSOR) 25 MG tablet Take 1 tablet (25 mg total) by mouth 3 (three) times daily. Patient taking differently: Take 25 mg by mouth 2 (two) times daily.  04/02/15  Yes Brittainy Erie Noe, PA-C  Naproxen Sod-Diphenhydramine (ALEVE PM PO) Take 1 tablet by mouth at bedtime as needed (sleep).   Yes Historical Provider, MD  naproxen sodium (ALEVE) 220 MG tablet Take 220 mg by mouth daily as needed (pain).   Yes Historical Provider, MD  nitroGLYCERIN (NITROSTAT) 0.4 MG SL tablet DISSOLVE 1 TABLET UNDER TONGUE EVERY 5 MINUTES FOR 3 DOSES AS NEEDED CHEST PAIN. IF NO RELIEF CALL 911 03/03/15  Yes Brittainy Erie Noe, PA-C  nystatin (MYCOSTATIN) 100000 UNIT/ML suspension Take 2.5 mLs by mouth at bedtime as needed (thrush). Swish and spit 05/10/15  Yes Historical Provider, MD  omeprazole (PRILOSEC) 20 MG capsule Take 20 mg by mouth 2 (two) times daily before a meal.    Yes Historical Provider, MD  Pitavastatin Calcium (LIVALO) 4 MG TABS Take 4 mg by mouth at bedtime.    Yes Historical Provider, MD  Polyethyl Glycol-Propyl Glycol (SYSTANE OP) Place 1 drop into both eyes 2 (two) times daily.   Yes Historical Provider, MD  ticagrelor (BRILINTA) 90 MG TABS tablet Take 1 tablet (90 mg total) by mouth 2 (two) times daily. 02/24/15  Yes Erma Heritage, PA  metFORMIN (GLUCOPHAGE-XR) 500 MG 24 hr tablet Take 500 mg by mouth daily as needed (take with the evening meal if CBG >120).  05/11/15   Historical Provider, MD    Physical Exam: BP 120/95 mmHg  Pulse 90  Temp(Src) 97.8 F (36.6  C) (Oral)  Resp 14  SpO2 98%  GENERAL : Well developed, well nourished, alert and cooperative, and appears to be in no acute distress. HEAD: normocephalic. EYES: PERRL, EOMI.  THROAT: Oral cavity and pharynx normal.  NECK: Neck supple, CARDIAC: Normal S1 and S2. No S3, S4 or murmurs. Rhythm is regular.. LUNGS: Clear to auscultation  ABDOMEN: Positive bowel sounds. Soft, nondistended, mildly tender to palpation . No guarding or rebound. No masses. NEUROLOGICAL: The mental examination revealed the patient was oriented to person, place, and time.CN II-XII intact. SKIN: Skin normal color, texture and turgor with no lesions or eruptions.           Labs on Admission:  Reviewed.   Radiological Exams on Admission: Ct Abdomen Pelvis W Contrast  05/23/2015  CLINICAL DATA:  Abdominal pain and diarrhea for 2 weeks, increasing over the last week. Coronary stent placed last week. EXAM: CT ABDOMEN AND PELVIS WITH CONTRAST TECHNIQUE: Multidetector CT imaging of the abdomen and pelvis was performed using the standard protocol following bolus administration of intravenous contrast. CONTRAST:  35mL OMNIPAQUE IOHEXOL 300 MG/ML  SOLN COMPARISON:  CT pelvis 07/20/2014. CT abdomen and pelvis 10/27/2012. FINDINGS: Mild dependent changes in lung bases. Small esophageal hiatal hernia. Diffuse fatty infiltration of the liver. No focal liver lesions. The gallbladder, pancreas, spleen, adrenal glands, abdominal aorta, inferior vena cava, and retroperitoneal lymph nodes are unremarkable. Multiple parapelvic cysts in the kidneys. No hydronephrosis or hydroureter. Renal nephrograms are symmetrical. Stomach and small bowel are mostly decompressed. Colon is mostly decompressed but is partially filled with fluid suggesting possible colitis. Free fluid demonstrated throughout the upper abdomen, percolating through the mesentery, along the pericolic gutters, and into the pelvis. This is likely ascites although reactive fluid  could have this appearance as well. No free air in the abdomen. Prominent visceral adipose tissues. Pelvis: The appendix is not identified. There is a stone in the bladder which was present previously. Mild bladder wall thickening possibly indicating cystitis. Left posterior bladder diverticulum. Prostate gland measures 3.6 cm diameter without significant enlargement. Scattered calcifications in the prostate. Prostate seed implants. No pelvic lymphadenopathy. Diverticulosis of the sigmoid colon. No obvious inflammatory changes of diverticulitis. Fluid along the sigmoid colon is probably related to the diffuse free fluid. Small right inguinal hernia containing fat and fluid. Small left inguinal hernia containing fat. Degenerative changes at the lumbosacral interspace. No destructive or sclerotic bone lesions appreciated. IMPRESSION: Decompressed but fluid-filled colon suggesting colitis. Diverticulosis of the sigmoid colon. No definite evidence of diverticulitis. Mild diffuse free fluid in the abdomen and pelvis may represent ascites or reactive fluid. No free air. Small esophageal hiatal hernia. Mild bladder wall thickening may indicate cystitis. Small bladder diverticulum. Parapelvic renal cysts. Bilateral inguinal hernias. Electronically Signed   By: Lucienne Capers M.D.   On: 05/23/2015 00:32   Dg Abd Acute W/chest  05/22/2015  CLINICAL DATA:  Generalized abdominal pain and diarrhea 2 weeks. Vomiting last night. EXAM: DG ABDOMEN ACUTE W/ 1V CHEST COMPARISON:  Chest x-ray 02/21/2015 and KUB 10/14/2014 FINDINGS: Lungs are adequately inflated without consolidation or effusion. Mild chronic changes over the lung apices. Cardiomediastinal silhouette and remainder of the chest is unchanged. Abdominal images demonstrate a relative paucity of bowel gas with mild fecal retention throughout the colon. No free peritoneal air. Minimal degenerate change of the spine and hips. IMPRESSION: Nonobstructive bowel gas pattern.  No acute cardiopulmonary disease. Electronically Signed   By: Marin Olp M.D.   On: 05/22/2015 21:07    EKG:  Independently reviewed. No significant ST/T ischemic changes.   Assessment/Plan  Diarrhea/ vomiting/abdominal pain:  CT abdomen shows colitis Possible c.diff ( prior history of c.diff before) but no leukocytosis  Continue cipro and flagyl  IVF, keep NPO, zofran prn, morphine prn, PPI IV.   AKI:  Likely prerenal  Continue IVF  CAD:  Continue BB, asp, statin and brilinta.   DM: glycemic control on low dose correction.    DVT prophylaxis: [place] enoxaparin  GI prophylaxis: PPI  Code Status: Full     Gennaro Africa M.D [facility]

## 2015-05-23 NOTE — Progress Notes (Signed)
Utilization Review Completed.Kirstine Jacquin T2/19/2017  

## 2015-05-23 NOTE — Progress Notes (Signed)
[facility] PROGRESS NOTE  Daniel Herrera [id number] DOB: 29-Oct-1940 DOA: 05/22/2015 PCP: [name], NP HPI/Subjective: 75 yo WM PMHx Anxiety, Prolonged QT interval, CAD of native artery, MI, Chronic Systolic CHF, Hypothyroidism, C.diff Colitis, Basal Cell Carcinoma skin, Prostate cancer T1c Adenocarcinoma of the prostate with a Gleason's score of 4+4 and a PSA of 8.8 (on finasteride) S/P 40 fractions of external radiation. Per patient also received radiation seeding (unable to verify). Last treatment October 2016. Dr.Firas [facility] oncology, Dr [name] radiation oncologist.  Who came with cc of diarrhea for the past week, frequent, non bloody, watery, with "very bad" smell , associated with chills, and periumbilical abdominal pain but without fever. He had non bloody vomiting yesterday. He has had chronic exertional chest pain since his last cath ( scheduled to see his cardiologist next month) but he is chest pain free now. No cough, dyspnea, dysuria. No other complaints.   HPI/Subjective: 2/19 A/O 4, NAD. States diarrhea 3 years. Patient has had multiple reasons for acute cases of diarrhea to include positive C. difficile colitis, radiation colitis? States Chronic Diarrhea has never been fully worked up  Assessment/Plan: Chronic diarrhea/Prostate cancer -Colitis infectious vs radiation induced vs bacterial overgrowth -Stool osmolality pending -Stool sodium and potassium pending -Stool fecal fat pending -Consider GI consult; colonoscopy? -Fecal lactoferrin pending -GI panel by PCR pending  Vomiting/Abdominal pain:  -CT abdomen shows colitis -C. difficile negative  -Continue cipro and flagyl  -Normal saline 75 ml/hr   Chronic Systolic CHF -Strict I&O -Daily weight - Metoprolol 25 mg BID -Brilinta 90 mg BID  CAD native artery:  -See CHF -Pravastatin 20 mg daily  AKI:  -Likely prerenal  -Continue IVF  DM Type 2 unspecified complication -123456  pending -Lipid panel pending.    Code Status: Full Family Communication: Family present  Disposition Plan: Resolution/improvement diarrhea and colitis   Consultants:  NA  Procedures: 02/23/2015 echocardiogram;- Left ventricle:mildly dilated. Mild focal basal and mild concentric hypertrophy. -LVEF=45% to 50% 2/18 CT abdomen pelvis with contrast;:-Decompressed but fluid-filled colon suggesting colitis. -Diverticulosis of the sigmoid colon. No evidence diverticulitis.  -Esophageal hiatal hernia. -Mild bladder wall thickening may indicate cystitis. -Bilateral inguinal hernias.   Cultures 2/19 C. difficile negative 2/19 GI panel by PCR pending   Antibiotics: Ciprofloxacin 2/19>> Flagyl 2/19>>   DVT prophylaxis Lovenox    Objective: Filed Vitals:   05/23/15 0500 05/23/15 0600 05/23/15 0645 05/23/15 1000  BP: 103/57 108/62 125/94 123/80  Pulse: 72 74 71 72  Temp:   98.5 F (36.9 C) 98.2 F (36.8 C)  TempSrc:   Oral Oral  Resp: 18 13 18 14   Height:   6\' 1"  (1.854 m)   Weight:   90.674 kg (199 lb 14.4 oz)   SpO2: 97% 98% 99% 98%    Intake/Output Summary (Last 24 hours) at 05/23/15 1338 Last data filed at 05/23/15 0900  Gross per 24 hour  Intake    300 ml  Output    400 ml  Net   -100 ml   Filed Weights   05/23/15 0645  Weight: 90.674 kg (199 lb 14.4 oz)     Exam: General:A/O 4, NAD. No acute respiratory distress Eyes: Negative headache, eye pain, double vision,negative scleral hemorrhage ENT: Negative Runny nose, negative ear pain, negative tinnitus, negative gingival bleeding, Neck:  Negative scars, masses, torticollis, lymphadenopathy, JVD Lungs: Clear to auscultation bilaterally without wheezes or crackles Cardiovascular: Regular rate and rhythm without murmur gallop or rub normal S1 and S2 Abdomen:negative  abdominal pain, negative dysphagia, nondistended, positive soft, bowel sounds, no rebound, no ascites, no appreciable mass Extremities: No  significant cyanosis, clubbing, or edema bilateral lower extremities Psychiatric:  Negative depression, negative anxiety, negative fatigue, negative mania  Neurologic:  Cranial nerves II through XII intact, tongue/uvula midline, all extremities muscle strength 5/5, sensation intact throughout, negative dysarthria, negative expressive aphasia, negative receptive aphasia.     Data Reviewed: Basic Metabolic Panel:  Recent Labs Lab 05/22/15 1903 05/23/15 0442  NA 137 139  K 4.4 4.8  CL 104 108  CO2 19* 23  GLUCOSE 282* 197*  BUN 20 22*  CREATININE 1.49* 1.44*  CALCIUM 9.0 8.6*   Liver Function Tests:  Recent Labs Lab 05/22/15 1903 05/23/15 0442  AST 27 21  ALT 32 24  ALKPHOS 76 60  BILITOT 0.6 0.5  PROT 5.9* 4.7*  ALBUMIN 3.6 3.0*    Recent Labs Lab 05/22/15 1903  LIPASE 24   No results for input(s): AMMONIA in the last 168 hours. CBC:  Recent Labs Lab 05/22/15 1903 05/23/15 0442  WBC 10.5 5.2  NEUTROABS  --  4.4  HGB 17.0 12.9*  HCT 49.6 38.9*  MCV 91.7 92.4  PLT 181 137*   Cardiac Enzymes: No results for input(s): CKTOTAL, CKMB, CKMBINDEX, TROPONINI in the last 168 hours. BNP (last 3 results) No results for input(s): BNP in the last 8760 hours.  ProBNP (last 3 results) No results for input(s): PROBNP in the last 8760 hours.  CBG:  Recent Labs Lab 05/23/15 0739 05/23/15 1153  GLUCAP 112* 93    Recent Results (from the past 240 hour(s))  C difficile quick scan w PCR reflex     Status: None   Collection Time: 05/23/15 12:20 AM  Result Value Ref Range Status   C Diff antigen NEGATIVE NEGATIVE Final   C Diff toxin NEGATIVE NEGATIVE Final   C Diff interpretation Negative for toxigenic C. difficile  Final     Studies: Ct Abdomen Pelvis W Contrast  05/23/2015  CLINICAL DATA:  Abdominal pain and diarrhea for 2 weeks, increasing over the last week. Coronary stent placed last week. EXAM: CT ABDOMEN AND PELVIS WITH CONTRAST TECHNIQUE: Multidetector  CT imaging of the abdomen and pelvis was performed using the standard protocol following bolus administration of intravenous contrast. CONTRAST:  75mL OMNIPAQUE IOHEXOL 300 MG/ML  SOLN COMPARISON:  CT pelvis 07/20/2014. CT abdomen and pelvis 10/27/2012. FINDINGS: Mild dependent changes in lung bases. Small esophageal hiatal hernia. Diffuse fatty infiltration of the liver. No focal liver lesions. The gallbladder, pancreas, spleen, adrenal glands, abdominal aorta, inferior vena cava, and retroperitoneal lymph nodes are unremarkable. Multiple parapelvic cysts in the kidneys. No hydronephrosis or hydroureter. Renal nephrograms are symmetrical. Stomach and small bowel are mostly decompressed. Colon is mostly decompressed but is partially filled with fluid suggesting possible colitis. Free fluid demonstrated throughout the upper abdomen, percolating through the mesentery, along the pericolic gutters, and into the pelvis. This is likely ascites although reactive fluid could have this appearance as well. No free air in the abdomen. Prominent visceral adipose tissues. Pelvis: The appendix is not identified. There is a stone in the bladder which was present previously. Mild bladder wall thickening possibly indicating cystitis. Left posterior bladder diverticulum. Prostate gland measures 3.6 cm diameter without significant enlargement. Scattered calcifications in the prostate. Prostate seed implants. No pelvic lymphadenopathy. Diverticulosis of the sigmoid colon. No obvious inflammatory changes of diverticulitis. Fluid along the sigmoid colon is probably related to the diffuse free fluid.  Small right inguinal hernia containing fat and fluid. Small left inguinal hernia containing fat. Degenerative changes at the lumbosacral interspace. No destructive or sclerotic bone lesions appreciated. IMPRESSION: Decompressed but fluid-filled colon suggesting colitis. Diverticulosis of the sigmoid colon. No definite evidence of  diverticulitis. Mild diffuse free fluid in the abdomen and pelvis may represent ascites or reactive fluid. No free air. Small esophageal hiatal hernia. Mild bladder wall thickening may indicate cystitis. Small bladder diverticulum. Parapelvic renal cysts. Bilateral inguinal hernias. Electronically Signed   By: Lucienne Capers M.D.   On: 05/23/2015 00:32   Dg Abd Acute W/chest  05/22/2015  CLINICAL DATA:  Generalized abdominal pain and diarrhea 2 weeks. Vomiting last night. EXAM: DG ABDOMEN ACUTE W/ 1V CHEST COMPARISON:  Chest x-ray 02/21/2015 and KUB 10/14/2014 FINDINGS: Lungs are adequately inflated without consolidation or effusion. Mild chronic changes over the lung apices. Cardiomediastinal silhouette and remainder of the chest is unchanged. Abdominal images demonstrate a relative paucity of bowel gas with mild fecal retention throughout the colon. No free peritoneal air. Minimal degenerate change of the spine and hips. IMPRESSION: Nonobstructive bowel gas pattern. No acute cardiopulmonary disease. Electronically Signed   By: Marin Olp M.D.   On: 05/22/2015 21:07    Scheduled Meds: . aspirin  81 mg Oral Daily  . ciprofloxacin  400 mg Intravenous BID  . dutasteride  0.5 mg Oral QHS  . enoxaparin (LOVENOX) injection  40 mg Subcutaneous Daily  . insulin aspart  0-9 Units Subcutaneous TID WC  . levothyroxine  75 mcg Oral QAC breakfast  . lisinopril  10 mg Oral Daily  . metoprolol tartrate  25 mg Oral BID  . metronidazole  500 mg Intravenous Q8H  . pantoprazole (PROTONIX) IV  40 mg Intravenous Q24H  . pravastatin  20 mg Oral q1800  . ticagrelor  90 mg Oral BID   Continuous Infusions: . sodium chloride 125 mL/hr at 05/22/15 2228    Active Problems:   Colitis   Prostate cancer (HCC)   Chronic diarrhea of unknown origin   Acute diarrhea   Chronic systolic CHF (congestive heart failure) (HCC)   CAD in native artery   Acute kidney injury ([place])   Type 2 diabetes mellitus with  unspecified complications ([place])    Time spent: 40 minutes    Lorie Cleckley, [place] [facility] Pager [phone]. If 7PM-7AM, please contact night-coverage at [email or web address], password [facility] 05/23/2015, 1:38 PM  LOS: 0 days    Care during the described time interval was provided by me .  I have reviewed this patient's available data, including medical history, events of note, physical examination, and all test results as part of my evaluation. I have personally reviewed and interpreted all radiology studies.   Dia Crawford, MD [phone] Pager

## 2015-05-23 NOTE — ED Notes (Signed)
Patient requesting something to sleep.  Nothing ordered.  Stated he usually takes either Aleve or Advil PM to sleep and the doesn't usually help him sleep long.  Will follow up

## 2015-05-24 DIAGNOSIS — A041 Enterotoxigenic Escherichia coli infection: Secondary | ICD-10-CM

## 2015-05-24 DIAGNOSIS — A498 Other bacterial infections of unspecified site: Secondary | ICD-10-CM

## 2015-05-24 DIAGNOSIS — K529 Noninfective gastroenteritis and colitis, unspecified: Principal | ICD-10-CM

## 2015-05-24 DIAGNOSIS — N179 Acute kidney failure, unspecified: Secondary | ICD-10-CM

## 2015-05-24 DIAGNOSIS — A043 Enterohemorrhagic Escherichia coli infection: Secondary | ICD-10-CM

## 2015-05-24 DIAGNOSIS — I2510 Atherosclerotic heart disease of native coronary artery without angina pectoris: Secondary | ICD-10-CM

## 2015-05-24 DIAGNOSIS — I5022 Chronic systolic (congestive) heart failure: Secondary | ICD-10-CM

## 2015-05-24 DIAGNOSIS — R197 Diarrhea, unspecified: Secondary | ICD-10-CM

## 2015-05-24 HISTORY — DX: Other bacterial infections of unspecified site: A49.8

## 2015-05-24 HISTORY — DX: Enterotoxigenic Escherichia coli infection: A04.1

## 2015-05-24 LAB — GASTROINTESTINAL PANEL BY PCR, STOOL (REPLACES STOOL CULTURE)
ASTROVIRUS: NOT DETECTED
Adenovirus F40/41: NOT DETECTED
CYCLOSPORA CAYETANENSIS: NOT DETECTED
Campylobacter species: NOT DETECTED
Cryptosporidium: NOT DETECTED
E. COLI O157: NOT DETECTED
ENTAMOEBA HISTOLYTICA: NOT DETECTED
ENTEROAGGREGATIVE E COLI (EAEC): NOT DETECTED
ENTEROTOXIGENIC E COLI (ETEC): DETECTED — AB
Enteropathogenic E coli (EPEC): NOT DETECTED
GIARDIA LAMBLIA: NOT DETECTED
NOROVIRUS GI/GII: NOT DETECTED
Plesimonas shigelloides: NOT DETECTED
Rotavirus A: NOT DETECTED
SALMONELLA SPECIES: NOT DETECTED
SAPOVIRUS (I, II, IV, AND V): NOT DETECTED
Shiga like toxin producing E coli (STEC): DETECTED — AB
Shigella/Enteroinvasive E coli (EIEC): NOT DETECTED
VIBRIO CHOLERAE: NOT DETECTED
VIBRIO SPECIES: NOT DETECTED
Yersinia enterocolitica: NOT DETECTED

## 2015-05-24 LAB — COMPREHENSIVE METABOLIC PANEL
ALT: 23 U/L (ref 17–63)
ANION GAP: 8 (ref 5–15)
AST: 23 U/L (ref 15–41)
Albumin: 3.4 g/dL — ABNORMAL LOW (ref 3.5–5.0)
Alkaline Phosphatase: 55 U/L (ref 38–126)
BUN: 13 mg/dL (ref 6–20)
CHLORIDE: 110 mmol/L (ref 101–111)
CO2: 23 mmol/L (ref 22–32)
CREATININE: 1.19 mg/dL (ref 0.61–1.24)
Calcium: 9 mg/dL (ref 8.9–10.3)
GFR, EST NON AFRICAN AMERICAN: 58 mL/min — AB (ref >60)
Glucose, Bld: 98 mg/dL (ref 65–99)
POTASSIUM: 4.4 mmol/L (ref 3.5–5.1)
SODIUM: 141 mmol/L (ref 135–145)
Total Bilirubin: 0.5 mg/dL (ref 0.3–1.2)
Total Protein: 5.2 g/dL — ABNORMAL LOW (ref 6.5–8.1)

## 2015-05-24 LAB — LIPID PANEL
Cholesterol: 118 mg/dL (ref 0–200)
HDL: 32 mg/dL — AB (ref >40)
LDL CALC: 48 mg/dL (ref 0–99)
TRIGLYCERIDES: 189 mg/dL — AB (ref <150)
Total CHOL/HDL Ratio: 3.7 RATIO
VLDL: 38 mg/dL (ref 0–40)

## 2015-05-24 LAB — CBC WITH DIFFERENTIAL/PLATELET
Basophils Absolute: 0 10*3/uL (ref 0.0–0.1)
Basophils Relative: 0 %
EOS ABS: 0 10*3/uL (ref 0.0–0.7)
EOS PCT: 1 %
HCT: 38 % — ABNORMAL LOW (ref 39.0–52.0)
Hemoglobin: 12.8 g/dL — ABNORMAL LOW (ref 13.0–17.0)
LYMPHS ABS: 0.7 10*3/uL (ref 0.7–4.0)
LYMPHS PCT: 18 %
MCH: 31.8 pg (ref 26.0–34.0)
MCHC: 33.7 g/dL (ref 30.0–36.0)
MCV: 94.3 fL (ref 78.0–100.0)
MONO ABS: 0.4 10*3/uL (ref 0.1–1.0)
Monocytes Relative: 10 %
Neutro Abs: 2.7 10*3/uL (ref 1.7–7.7)
Neutrophils Relative %: 71 %
PLATELETS: 121 10*3/uL — AB (ref 150–400)
RBC: 4.03 MIL/uL — ABNORMAL LOW (ref 4.22–5.81)
RDW: 13.1 % (ref 11.5–15.5)
WBC: 3.9 10*3/uL — AB (ref 4.0–10.5)

## 2015-05-24 LAB — GLUCOSE, CAPILLARY
GLUCOSE-CAPILLARY: 89 mg/dL (ref 65–99)
Glucose-Capillary: 99 mg/dL (ref 65–99)
Glucose-Capillary: 118 mg/dL — ABNORMAL HIGH (ref 65–99)
Glucose-Capillary: 88 mg/dL (ref 65–99)

## 2015-05-24 LAB — HEMOGLOBIN A1C
HEMOGLOBIN A1C: 6.3 % — AB (ref 4.8–5.6)
Mean Plasma Glucose: 134 mg/dL

## 2015-05-24 LAB — C DIFFICILE QUICK SCREEN W PCR REFLEX
C DIFFICILE (CDIFF) TOXIN: NEGATIVE
C Diff antigen: NEGATIVE
C Diff interpretation: NEGATIVE

## 2015-05-24 LAB — MAGNESIUM: MAGNESIUM: 2 mg/dL (ref 1.7–2.4)

## 2015-05-24 MED ORDER — SACCHAROMYCES BOULARDII 250 MG PO CAPS
250.0000 mg | ORAL_CAPSULE | Freq: Two times a day (BID) | ORAL | Status: DC
  Administered 2015-05-24 – 2015-05-25 (×3): 250 mg via ORAL
  Filled 2015-05-24 (×3): qty 1

## 2015-05-24 MED ORDER — FAMOTIDINE 20 MG PO TABS: 20.0000 mg | ORAL_TABLET | Freq: Two times a day (BID) | ORAL | Status: DC | PRN

## 2015-05-24 MED ORDER — ONDANSETRON HCL 4 MG/2ML IJ SOLN: 4.0000 mg | Freq: Four times a day (QID) | INTRAMUSCULAR | Status: DC | PRN

## 2015-05-24 MED ORDER — ZOLPIDEM TARTRATE 5 MG PO TABS
5.0000 mg | ORAL_TABLET | Freq: Once | ORAL | Status: AC
  Administered 2015-05-24: 5 mg via ORAL
  Filled 2015-05-24: qty 1

## 2015-05-24 NOTE — Progress Notes (Signed)
[facility] PROGRESS NOTE  Daniel Herrera [id number] DOB: [birth year] DOA: 05/22/2015 PCP: [name], NP  Brief Summary  75 yo WM PMHx Anxiety, Prolonged QT interval, CAD of native artery, MI, Chronic Systolic CHF, Hypothyroidism, C.diff Colitis, Basal Cell Carcinoma skin, Prostate cancer T1c Adenocarcinoma of the prostate with a Gleason's score of 4+4 and a PSA of 8.8 (on finasteride) S/P 40 fractions of external radiation. Per patient also received radiation seeding (unable to verify). Last treatment October 2016. Dr.Firas [facility] oncology, Dr [name] radiation oncologist.  Who came with cc of diarrhea for the past week, frequent, non bloody, watery, with "very bad" smell , associated with chills, and periumbilical abdominal pain but without fever. He had non bloody vomiting yesterday. He has had chronic exertional chest pain since his last cath ( scheduled to see his cardiologist next month) but he is chest pain free now. No cough, dyspnea, dysuria. No other complaints.   Assessment/Plan  Acute on chronic diarrhea, initially suspicious for a viral infection superimposed on radiation proctitis, however, GI pathogen panel reported today was positive for Enterotoxigenic E. Coli and Shiga-like-toxin producing E. Coli.  This likely explains his abdominal pain and vomiting.   -  Occult positive to be expected with shiga-toxin -  Verified that health department will be notified within 24 hours by lab -  D/c antibiotics -  Supportive care with antiemetics until tolerating diet -  Daily CBC to monitor for MAHA, however, creatinine already trending down  For chronic diarrhea, recommend follow up with gastroenterology -  D/c metformin and omeprazole  -  Use famotidine prn -  TSH was wnl in Dec 2016  Prostate cancer s/p XRT -  F/u with urology and Dr. [name] from radiation oncology  Chronic Systolic CHF, stable, EF [id number] from ECHO in 02/2015 -Strict I&O not strictly  recorded -Daily weight, stable - Metoprolol 25 mg BID - resume ACEI once creatinine stable  CAD native artery:  -Pravastatin 20 mg daily (could consider escalating to high dose statin) -Continue Brilinta 90 mg BID -  Very worried about his exercise tolerance  Frequent PVC -  Tele:  NSR with frequent PVC > being followed by cardiology -  Patient does not want to increase to his prescribed dose of metoprolol because it makes him feel tired, but he also has frequent palpitations if he does not take the mediation -  Advised him to keep his appointment with Dr. Gwenlyn Found in early March and to continue his medications at the current dsoe for now  AKI, resolving with IVF.  Unlikely to have HUS given age -Likely prerenal  -  D/c IVF  DM Type 2 unspecified complication -123456 6.3  - given chronic diarrhea, would not recommend continuing metformin  Mild leukopenia, anemia and thrombocytopenia, likely acute phase reactant -  Repeat CBC in AM -  If thrombocytopenia and anemia drastically worsen, will send for smear for schistocytes and additional work up for DIC and HUS  Diet:  Full liquids and advance as tolerated Access:  PIV IVF:  off Proph:  lovenox  Code Status: full Family Communication: patient alone Disposition Plan: pending able to tolerate diet.  His stools have already started to slow down   Consultants:  none  02/23/2015 echocardiogram;- Left ventricle:mildly dilated. Mild focal basal and mild concentric hypertrophy. -LVEF=45% to 50% 2/18 CT abdomen pelvis with contrast;:-Decompressed but fluid-filled colon suggesting colitis. -Diverticulosis of the sigmoid colon. No evidence diverticulitis.  -Esophageal hiatal hernia. -Mild bladder wall thickening may  indicate cystitis. -Bilateral inguinal hernias.   Cultures 2/19 C. difficile negative 2/19 GI panel by PCR pending   Antibiotics: Ciprofloxacin 2/19>> 2/20 Flagyl 2/19>>2/20  HPI/Subjective:  Stools are slowing  down, 2 in the last 24 hours.  Was having very watery diarrhea prior to admission which was worse than his typical chronic diarrhea.  He also normally does not have vomiting or abdominal pains.    Objective: Filed Vitals:   05/23/15 2022 05/24/15 0500 05/24/15 0550 05/24/15 0805  BP: 127/69  123/76 130/64  Pulse: 69  64 77  Temp: 98 F (36.7 C)  97.8 F (36.6 C) 97.5 F (36.4 C)  TempSrc: Oral  Oral Oral  Resp: 14  16 18   Height:      Weight: 92.307 kg (203 lb 8 oz) 92.307 kg (203 lb 8 oz)    SpO2: 99%  100% 100%    Intake/Output Summary (Last 24 hours) at 05/24/15 1542 Last data filed at 05/24/15 1324  Gross per 24 hour  Intake   2160 ml  Output   1252 ml  Net    908 ml   Filed Weights   05/23/15 1336 05/23/15 2022 05/24/15 0500  Weight: 92.398 kg (203 lb 11.2 oz) 92.307 kg (203 lb 8 oz) 92.307 kg (203 lb 8 oz)   Body mass index is 26.85 kg/(m^2).  Exam:   General:  Adult male, No acute distress  HEENT:  NCAT, MMM  Cardiovascular:  RRR, nl S1, S2 no mrg, 2+ pulses, warm extremities  Respiratory:  CTAB, no increased WOB  Abdomen:   NABS, soft, NT/ND  MSK:   Normal tone and bulk, trace bilateral LEE  Neuro:  Grossly intact  Data Reviewed: Basic Metabolic Panel:  Recent Labs Lab 05/22/15 1903 05/23/15 0442 05/24/15 0725  NA 137 139 141  K 4.4 4.8 4.4  CL 104 108 110  CO2 19* 23 23  GLUCOSE 282* 197* 98  BUN 20 22* 13  CREATININE 1.49* 1.44* 1.19  CALCIUM 9.0 8.6* 9.0  MG  --   --  2.0   Liver Function Tests:  Recent Labs Lab 05/22/15 1903 05/23/15 0442 05/24/15 0725  AST 27 21 23   ALT 32 24 23  ALKPHOS 76 60 55  BILITOT 0.6 0.5 0.5  PROT 5.9* 4.7* 5.2*  ALBUMIN 3.6 3.0* 3.4*    Recent Labs Lab 05/22/15 1903  LIPASE 24   No results for input(s): AMMONIA in the last 168 hours. CBC:  Recent Labs Lab 05/22/15 1903 05/23/15 0442 05/24/15 0725  WBC 10.5 5.2 3.9*  NEUTROABS  --  4.4 2.7  HGB 17.0 12.9* 12.8*  HCT 49.6 38.9* 38.0*   MCV 91.7 92.4 94.3  PLT 181 137* 121*    Recent Results (from the past 240 hour(s))  C difficile quick scan w PCR reflex     Status: None   Collection Time: 05/23/15 12:20 AM  Result Value Ref Range Status   C Diff antigen NEGATIVE NEGATIVE Final   C Diff toxin NEGATIVE NEGATIVE Final   C Diff interpretation Negative for toxigenic C. difficile  Final  C difficile quick scan w PCR reflex     Status: None   Collection Time: 05/23/15  8:18 PM  Result Value Ref Range Status   C Diff antigen NEGATIVE NEGATIVE Final   C Diff toxin NEGATIVE NEGATIVE Final   C Diff interpretation Negative for toxigenic C. difficile  Final  Gastrointestinal Panel by PCR , Stool  Status: Abnormal   Collection Time: 05/23/15  8:18 PM  Result Value Ref Range Status   Campylobacter species NOT DETECTED NOT DETECTED Final   Plesimonas shigelloides NOT DETECTED NOT DETECTED Final   Salmonella species NOT DETECTED NOT DETECTED Final   Yersinia enterocolitica NOT DETECTED NOT DETECTED Final   Vibrio species NOT DETECTED NOT DETECTED Final   Vibrio cholerae NOT DETECTED NOT DETECTED Final   Enteroaggregative E coli (EAEC) NOT DETECTED NOT DETECTED Final   Enteropathogenic E coli (EPEC) NOT DETECTED NOT DETECTED Final   Enterotoxigenic E coli (ETEC) DETECTED (A) NOT DETECTED Final    Comment: CRITICAL RESULT CALLED TO, READ BACK BY AND VERIFIED WITH: GINA HICKS AT 1516 05/24/15 BY [facility]    Shiga like toxin producing E coli (STEC) DETECTED (A) NOT DETECTED Final    Comment: CRITICAL RESULT CALLED TO, READ BACK BY AND VERIFIED WITH: GINA HICKS AT 1516 05/24/15 BY [facility]    E. coli O157 NOT DETECTED NOT DETECTED Final   Shigella/Enteroinvasive E coli (EIEC) NOT DETECTED NOT DETECTED Final   Cryptosporidium NOT DETECTED NOT DETECTED Final   Cyclospora cayetanensis NOT DETECTED NOT DETECTED Final   Entamoeba histolytica NOT DETECTED NOT DETECTED Final   Giardia lamblia NOT DETECTED NOT DETECTED Final   Adenovirus  F40/41 NOT DETECTED NOT DETECTED Final   Astrovirus NOT DETECTED NOT DETECTED Final   Norovirus GI/GII NOT DETECTED NOT DETECTED Final   Rotavirus A NOT DETECTED NOT DETECTED Final   Sapovirus (I, II, IV, and V) NOT DETECTED NOT DETECTED Final     Studies: Ct Abdomen Pelvis W Contrast  05/23/2015  CLINICAL DATA:  Abdominal pain and diarrhea for 2 weeks, increasing over the last week. Coronary stent placed last week. EXAM: CT ABDOMEN AND PELVIS WITH CONTRAST TECHNIQUE: Multidetector CT imaging of the abdomen and pelvis was performed using the standard protocol following bolus administration of intravenous contrast. CONTRAST:  37mL OMNIPAQUE IOHEXOL 300 MG/ML  SOLN COMPARISON:  CT pelvis 07/20/2014. CT abdomen and pelvis 10/27/2012. FINDINGS: Mild dependent changes in lung bases. Small esophageal hiatal hernia. Diffuse fatty infiltration of the liver. No focal liver lesions. The gallbladder, pancreas, spleen, adrenal glands, abdominal aorta, inferior vena cava, and retroperitoneal lymph nodes are unremarkable. Multiple parapelvic cysts in the kidneys. No hydronephrosis or hydroureter. Renal nephrograms are symmetrical. Stomach and small bowel are mostly decompressed. Colon is mostly decompressed but is partially filled with fluid suggesting possible colitis. Free fluid demonstrated throughout the upper abdomen, percolating through the mesentery, along the pericolic gutters, and into the pelvis. This is likely ascites although reactive fluid could have this appearance as well. No free air in the abdomen. Prominent visceral adipose tissues. Pelvis: The appendix is not identified. There is a stone in the bladder which was present previously. Mild bladder wall thickening possibly indicating cystitis. Left posterior bladder diverticulum. Prostate gland measures 3.6 cm diameter without significant enlargement. Scattered calcifications in the prostate. Prostate seed implants. No pelvic lymphadenopathy.  Diverticulosis of the sigmoid colon. No obvious inflammatory changes of diverticulitis. Fluid along the sigmoid colon is probably related to the diffuse free fluid. Small right inguinal hernia containing fat and fluid. Small left inguinal hernia containing fat. Degenerative changes at the lumbosacral interspace. No destructive or sclerotic bone lesions appreciated. IMPRESSION: Decompressed but fluid-filled colon suggesting colitis. Diverticulosis of the sigmoid colon. No definite evidence of diverticulitis. Mild diffuse free fluid in the abdomen and pelvis may represent ascites or reactive fluid. No free air. Small esophageal hiatal hernia. Mild  bladder wall thickening may indicate cystitis. Small bladder diverticulum. Parapelvic renal cysts. Bilateral inguinal hernias. Electronically Signed   By: Lucienne Capers M.D.   On: 05/23/2015 00:32   Dg Abd Acute W/chest  05/22/2015  CLINICAL DATA:  Generalized abdominal pain and diarrhea 2 weeks. Vomiting last night. EXAM: DG ABDOMEN ACUTE W/ 1V CHEST COMPARISON:  Chest x-ray 02/21/2015 and KUB 10/14/2014 FINDINGS: Lungs are adequately inflated without consolidation or effusion. Mild chronic changes over the lung apices. Cardiomediastinal silhouette and remainder of the chest is unchanged. Abdominal images demonstrate a relative paucity of bowel gas with mild fecal retention throughout the colon. No free peritoneal air. Minimal degenerate change of the spine and hips. IMPRESSION: Nonobstructive bowel gas pattern. No acute cardiopulmonary disease. Electronically Signed   By: Marin Olp M.D.   On: 05/22/2015 21:07    Scheduled Meds: . aspirin  81 mg Oral Daily  . dutasteride  0.5 mg Oral QHS  . enoxaparin (LOVENOX) injection  40 mg Subcutaneous Daily  . insulin aspart  0-9 Units Subcutaneous TID WC  . levothyroxine  75 mcg Oral QAC breakfast  . lisinopril  10 mg Oral Daily  . metoprolol tartrate  25 mg Oral BID  . pantoprazole (PROTONIX) IV  40 mg  Intravenous Q24H  . pravastatin  20 mg Oral q1800  . saccharomyces boulardii  250 mg Oral BID  . ticagrelor  90 mg Oral BID   Continuous Infusions: . sodium chloride 75 mL/hr at 05/24/15 0919    Active Problems:   Colitis   Prostate cancer (HCC)   Chronic diarrhea of unknown origin   Acute diarrhea   Chronic systolic CHF (congestive heart failure) (HCC)   CAD in native artery   Acute kidney injury ([place])   Type 2 diabetes mellitus with unspecified complications ([place])    Time spent: 30 min    Vitaly Wanat, [place] [facility] Pager [phone]. If 7PM-7AM, please contact night-coverage at [email or web address], password [facility] 05/24/2015, 3:42 PM  LOS: 1 day

## 2015-05-25 DIAGNOSIS — A498 Other bacterial infections of unspecified site: Secondary | ICD-10-CM

## 2015-05-25 LAB — CBC WITH DIFFERENTIAL/PLATELET
BASOS PCT: 1 %
Basophils Absolute: 0 10*3/uL (ref 0.0–0.1)
EOS ABS: 0 10*3/uL (ref 0.0–0.7)
EOS PCT: 1 %
HCT: 41.8 % (ref 39.0–52.0)
HEMOGLOBIN: 14 g/dL (ref 13.0–17.0)
LYMPHS PCT: 14 %
Lymphs Abs: 0.5 10*3/uL — ABNORMAL LOW (ref 0.7–4.0)
MCH: 30.8 pg (ref 26.0–34.0)
MCHC: 33.5 g/dL (ref 30.0–36.0)
MCV: 92.1 fL (ref 78.0–100.0)
Monocytes Absolute: 0.3 10*3/uL (ref 0.1–1.0)
Monocytes Relative: 9 %
NEUTROS ABS: 2.6 10*3/uL (ref 1.7–7.7)
Neutrophils Relative %: 75 %
PLATELETS: 145 10*3/uL — AB (ref 150–400)
RBC: 4.54 MIL/uL (ref 4.22–5.81)
RDW: 13 % (ref 11.5–15.5)
WBC: 3.5 10*3/uL — ABNORMAL LOW (ref 4.0–10.5)

## 2015-05-25 LAB — HEMOGLOBIN A1C
HEMOGLOBIN A1C: 6.4 % — AB (ref 4.8–5.6)
MEAN PLASMA GLUCOSE: 137 mg/dL

## 2015-05-25 LAB — BASIC METABOLIC PANEL
Anion gap: 13 (ref 5–15)
BUN: 12 mg/dL (ref 6–20)
CHLORIDE: 105 mmol/L (ref 101–111)
CO2: 23 mmol/L (ref 22–32)
CREATININE: 1.3 mg/dL — AB (ref 0.61–1.24)
Calcium: 9.8 mg/dL (ref 8.9–10.3)
GFR calc Af Amer: 60 mL/min — ABNORMAL LOW (ref >60)
GFR calc non Af Amer: 52 mL/min — ABNORMAL LOW (ref >60)
GLUCOSE: 166 mg/dL — AB (ref 65–99)
POTASSIUM: 3.9 mmol/L (ref 3.5–5.1)
Sodium: 141 mmol/L (ref 135–145)

## 2015-05-25 LAB — FECAL LACTOFERRIN, QUANT: Fecal Lactoferrin: NEGATIVE

## 2015-05-25 LAB — MAGNESIUM: MAGNESIUM: 2 mg/dL (ref 1.7–2.4)

## 2015-05-25 LAB — GLUCOSE, CAPILLARY
GLUCOSE-CAPILLARY: 111 mg/dL — AB (ref 65–99)
Glucose-Capillary: 147 mg/dL — ABNORMAL HIGH (ref 65–99)

## 2015-05-25 MED ORDER — METOPROLOL TARTRATE 25 MG PO TABS: 25.0000 mg | ORAL_TABLET | Freq: Two times a day (BID) | ORAL | Status: DC

## 2015-05-25 MED ORDER — FAMOTIDINE 20 MG PO TABS: 20.0000 mg | ORAL_TABLET | Freq: Two times a day (BID) | ORAL | Status: DC | PRN

## 2015-05-25 MED ORDER — SACCHAROMYCES BOULARDII 250 MG PO CAPS
250.0000 mg | ORAL_CAPSULE | Freq: Two times a day (BID) | ORAL | Status: DC
Start: 2015-05-25 — End: 2018-01-02

## 2015-05-25 NOTE — Discharge Summary (Addendum)
Physician Discharge Summary  Daniel Herrera [id number] DOB: 1940/12/19 DOA: 05/22/2015  PCP: [name], NP  Admit date: 05/22/2015 Discharge date: 05/25/2015  Recommendations for Outpatient Follow-up:  1. Follow up with gastroenterology for chronic diarrhea and possible early radiation proctitis.   2. Follow up with Dr. Gwenlyn Found regarding ongoing symptomatic PVCs but poor tolerance for beta blocker.   3. PCP in 1-2 weeks for repeat BMP and CBC.   Discharge Diagnoses:  Principal Problem:   Enterotoxigenic Escherichia coli infection Active Problems:   Palpitations   Colitis   Prostate cancer (HCC)   Chronic diarrhea of unknown origin   Acute diarrhea   Chronic systolic CHF (congestive heart failure) (HCC)   CAD in native artery   Acute kidney injury ([place])   Type 2 diabetes mellitus with unspecified complications (HCC)   STEC (Shiga toxin-producing Escherichia coli) infection   Discharge Condition: stable, improved  Diet recommendation:  Diabetic diet  Wt Readings from Last 3 Encounters:  05/25/15 89.994 kg (198 lb 6.4 oz)  04/14/15 92.534 kg (204 lb)  04/08/15 92.704 kg (204 lb 6 oz)    History of present illness:   75 yo WM PMHx Anxiety, Prolonged QT interval, CAD of native artery, MI, Chronic Systolic CHF, Hypothyroidism, C.diff Colitis, Basal Cell Carcinoma skin, Prostate cancer T1c Adenocarcinoma of the prostate with a Gleason's score of 4+4 and a PSA of 8.8 (on finasteride) S/P 40 fractions of external radiation. Per patient, also received radiation seeding (unable to verify). Last treatment October 2016. Dr.[facility]iras [facility] oncology, Dr [name] radiation oncologist.  Although he has chronic watery diarrhea, his symptoms had worsened a few days prior to admission with frequent, nonbloody, watery, malodorous diarrhea with chills, abdominal pain and nausea and vomiting.    Hospital Course:   Acute on chronic diarrhea, initially suspicious for a viral infection  superimposed on radiation proctitis.  CT demonstrated decompressed but fluid filled colon with diverticulosis, and mild bladder wall thickening, but no evidence of colitis or diverticulitis.  He was started on ciprofloxacin and flagyl empirically upon admission, however, his antibiotics were discontinued because his C. Diff PCR was negative and his GI pathogen panel demonstrated Enterotoxigenic E. Coli and Shiga-like-toxin producing E. Coli. He was occult positive, consistent with Shiga-like toxin producing E. Coli (STEC) infection.  This infection likely explains his acute abdominal pain and vomiting. [place] and has some exposure directly and indirectly with the animals.  The results of his PCR were reported to the health department by the lab.  Although he had some mild AKI on admission, his creatinine trended down with IVF and he did not demonstrated signs of MAHA/HUS.  He was able to tolerate soft foods and his stools had slowed down prior to discharge.    For chronic diarrhea, recommend follow up with gastroenterology.  Recommended stopping metformin and omeprazole.  He may use famotidine prn.  TSH was wnl in Dec 2016  Prostate cancer s/p XRT,  F/u with urology and Dr. [name] from radiation oncology  Chronic Systolic CHF, stable, EF [id number] from ECHO in 02/2015, continued metoprolol 25 mg BID which is what he had been taking at home despite a recommendation to increase that dose by his cardiologist.  Resumed ACEI.    CAD native artery, chest pain free.  Continued pravastatin 20 mg daily (could consider escalating to high dose statin), BB, and Brilinta 90 mg BID.  Patient worried about his exercise tolerance.  Could he resume cardiac rehabilitation?  Frequent PVC, Tele: NSR with frequent PVC.  Continued metoprolol and advised him to keep his appointment with Dr. Gwenlyn Found in early March.  AKI, resolving with IVF. Unlikely to have HUS given age.    DM Type 2 unspecified  complication, 123456 6.3, given chronic diarrhea, discontinued metformin  Mild leukopenia, anemia and thrombocytopenia, likely acute phase reactants.  Anemia resolved.  Platelets have almost completely recovered at [place].  WBC continuing to trend down.  Recommend repeat CBC by PCP.     Consultants:  none  02/23/2015 echocardiogram;- Left ventricle:mildly dilated. Mild focal basal and mild concentric hypertrophy. -LVEF=45% to 50% 2/18 CT abdomen pelvis with contrast;:-Decompressed but fluid-filled colon suggesting colitis. -Diverticulosis of the sigmoid colon. No evidence diverticulitis.  -Esophageal hiatal hernia. -Mild bladder wall thickening may indicate cystitis. -Bilateral inguinal hernias.   Cultures 2/19 C. difficile negative 2/19 GI panel by PCR    Antibiotics: Ciprofloxacin 2/19>> 2/20 Flagyl 2/19>>2/20  Discharge Exam: Filed Vitals:   05/25/15 0500 05/25/15 1111  BP: 127/77 132/78  Pulse: 66 72  Temp: 98.5 F (36.9 C) 98.2 F (36.8 C)  Resp: 20 18   Filed Vitals:   05/24/15 0805 05/24/15 1802 05/25/15 0500 05/25/15 1111  BP: 130/64 135/77 127/77 132/78  Pulse: 77 60 66 72  Temp: 97.5 F (36.4 C) 97.2 F (36.2 C) 98.5 F (36.9 C) 98.2 F (36.8 C)  TempSrc: Oral Oral Oral Oral  Resp: 18 18 20 18   Height:      Weight:   89.994 kg (198 lb 6.4 oz)   SpO2: 100% 100% 97% 100%     General: Adult male, No acute distress  HEENT: NCAT, MMM  Cardiovascular: RRR with occasional missed/dropped beats/PVC, nl S1, S2 no mrg, 2+ pulses, warm extremities  Respiratory: CTAB, no increased WOB  Abdomen: hyperactive BS, soft, NT/ND  MSK: Normal tone and bulk, trace bilateral LEE  Neuro: Grossly intact  Discharge Instructions      Discharge Instructions    Call MD for:  difficulty breathing, headache or visual disturbances    Complete by:  As directed      Call MD for:  extreme fatigue    Complete by:  As directed      Call MD for:  hives     Complete by:  As directed      Call MD for:  persistant dizziness or light-headedness    Complete by:  As directed      Call MD for:  persistant nausea and vomiting    Complete by:  As directed      Call MD for:  severe uncontrolled pain    Complete by:  As directed      Call MD for:  temperature >100.4    Complete by:  As directed      Diet Carb Modified    Complete by:  As directed      Discharge instructions    Complete by:  As directed   Please use tylenol (acetaminophen) as needed for pain and if you need a medication to help you sleep, you may use benadryl (diphenhydramine) or the combination of the diphenhydramine with acetaminophen.  Please stop your omeprazole and narprosyn for now.  Take a probiotic regularly and follow up with your primary care doctor and gastroenterologist in the next few weeks.     Increase activity slowly    Complete by:  As directed             Medication List  STOP taking these medications        ALEVE 220 MG tablet  Generic drug:  naproxen sodium     ALEVE PM PO     metFORMIN 500 MG 24 hr tablet  Commonly known as:  GLUCOPHAGE-XR     omeprazole 20 MG capsule  Commonly known as:  PRILOSEC      TAKE these medications        aspirin 81 MG chewable tablet  Chew 1 tablet (81 mg total) by mouth daily.     clobetasol ointment 0.05 %  Commonly known as:  TEMOVATE  Apply 1 application topically daily as needed (cellulitis on legs).     dutasteride 0.5 MG capsule  Commonly known as:  AVODART  Take 1 capsule (0.5 mg total) by mouth every evening.     famotidine 20 MG tablet  Commonly known as:  PEPCID  Take 1 tablet (20 mg total) by mouth 2 (two) times daily as needed for heartburn or indigestion.     fluticasone 27.5 MCG/SPRAY nasal spray  Commonly known as:  VERAMYST  Place 1 spray into both nostrils daily.     levothyroxine 75 MCG tablet  Commonly known as:  SYNTHROID, LEVOTHROID  Take 75 mcg by mouth daily before breakfast.      lisinopril 10 MG tablet  Commonly known as:  PRINIVIL,ZESTRIL  Take 10 mg by mouth daily.     LIVALO 4 MG Tabs  Generic drug:  Pitavastatin Calcium  Take 4 mg by mouth at bedtime.     metoprolol tartrate 25 MG tablet  Commonly known as:  LOPRESSOR  Take 1 tablet (25 mg total) by mouth 2 (two) times daily.     nitroGLYCERIN 0.4 MG SL tablet  Commonly known as:  NITROSTAT  DISSOLVE 1 TABLET UNDER TONGUE EVERY 5 MINUTES FOR 3 DOSES AS NEEDED CHEST PAIN. IF NO RELIEF CALL 911     nystatin 100000 UNIT/ML suspension  Commonly known as:  MYCOSTATIN  Take 2.5 mLs by mouth at bedtime as needed (thrush). Swish and spit     saccharomyces boulardii 250 MG capsule  Commonly known as:  FLORASTOR  Take 1 capsule (250 mg total) by mouth 2 (two) times daily.     SYSTANE OP  Place 1 drop into both eyes 2 (two) times daily.     ticagrelor 90 MG Tabs tablet  Commonly known as:  BRILINTA  Take 1 tablet (90 mg total) by mouth 2 (two) times daily.       Follow-up Information    Follow up with [facility], NP. Schedule an appointment as soon as possible for a visit in 2 weeks.   Specialty:  Internal Medicine   Contact information:   [place], [place] [address] [phone]       Follow up with Quay Burow, MD On 06/01/2015.   Specialties:  Cardiology, Radiology   Contact information:   [address] [place] [address] [phone]       Follow up with [facility], MD. Schedule an appointment as soon as possible for a visit in 3 weeks.   Specialty:  Specialist   Why:  [phone]   Contact information:   [place]  [address] [phone]        The results of significant diagnostics from this hospitalization (including imaging, microbiology, ancillary and laboratory) are listed below for reference.    Significant Diagnostic Studies: Ct Abdomen Pelvis W Contrast  05/23/2015  CLINICAL DATA:  Abdominal pain and  diarrhea for 2 weeks, increasing over the last week. Coronary stent placed last week. EXAM: CT ABDOMEN AND PELVIS WITH CONTRAST TECHNIQUE: Multidetector CT imaging of the abdomen and pelvis was performed using the standard protocol following bolus administration of intravenous contrast. CONTRAST:  27mL OMNIPAQUE IOHEXOL 300 MG/ML  SOLN COMPARISON:  CT pelvis 07/20/2014. CT abdomen and pelvis 10/27/2012. FINDINGS: Mild dependent changes in lung bases. Small esophageal hiatal hernia. Diffuse fatty infiltration of the liver. No focal liver lesions. The gallbladder, pancreas, spleen, adrenal glands, abdominal aorta, inferior vena cava, and retroperitoneal lymph nodes are unremarkable. Multiple parapelvic cysts in the kidneys. No hydronephrosis or hydroureter. Renal nephrograms are symmetrical. Stomach and small bowel are mostly decompressed. Colon is mostly decompressed but is partially filled with fluid suggesting possible colitis. Free fluid demonstrated throughout the upper abdomen, percolating through the mesentery, along the pericolic gutters, and into the pelvis. This is likely ascites although reactive fluid could have this appearance as well. No free air in the abdomen. Prominent visceral adipose tissues. Pelvis: The appendix is not identified. There is a stone in the bladder which was present previously. Mild bladder wall thickening possibly indicating cystitis. Left posterior bladder diverticulum. Prostate gland measures 3.6 cm diameter without significant enlargement. Scattered calcifications in the prostate. Prostate seed implants. No pelvic lymphadenopathy. Diverticulosis of the sigmoid colon. No obvious inflammatory changes of diverticulitis. Fluid along the sigmoid colon is probably related to the diffuse free fluid. Small right inguinal hernia containing fat and fluid. Small left inguinal hernia containing fat. Degenerative changes at the lumbosacral interspace. No destructive or sclerotic bone lesions  appreciated. IMPRESSION: Decompressed but fluid-filled colon suggesting colitis. Diverticulosis of the sigmoid colon. No definite evidence of diverticulitis. Mild diffuse free fluid in the abdomen and pelvis may represent ascites or reactive fluid. No free air. Small esophageal hiatal hernia. Mild bladder wall thickening may indicate cystitis. Small bladder diverticulum. Parapelvic renal cysts. Bilateral inguinal hernias. Electronically Signed   By: Lucienne Capers M.D.   On: 05/23/2015 00:32   Dg Abd Acute W/chest  05/22/2015  CLINICAL DATA:  Generalized abdominal pain and diarrhea 2 weeks. Vomiting last night. EXAM: DG ABDOMEN ACUTE W/ 1V CHEST COMPARISON:  Chest x-ray 02/21/2015 and KUB 10/14/2014 FINDINGS: Lungs are adequately inflated without consolidation or effusion. Mild chronic changes over the lung apices. Cardiomediastinal silhouette and remainder of the chest is unchanged. Abdominal images demonstrate a relative paucity of bowel gas with mild fecal retention throughout the colon. No free peritoneal air. Minimal degenerate change of the spine and hips. IMPRESSION: Nonobstructive bowel gas pattern. No acute cardiopulmonary disease. Electronically Signed   By: Marin Olp M.D.   On: 05/22/2015 21:07    Microbiology: Recent Results (from the past 240 hour(s))  C difficile quick scan w PCR reflex     Status: None   Collection Time: 05/23/15 12:20 AM  Result Value Ref Range Status   C Diff antigen NEGATIVE NEGATIVE Final   C Diff toxin NEGATIVE NEGATIVE Final   C Diff interpretation Negative for toxigenic C. difficile  Final  C difficile quick scan w PCR reflex     Status: None   Collection Time: 05/23/15  8:18 PM  Result Value Ref Range Status   C Diff antigen NEGATIVE NEGATIVE Final   C Diff toxin NEGATIVE NEGATIVE Final   C Diff interpretation Negative for toxigenic C. difficile  Final  Gastrointestinal Panel by PCR , Stool     Status:  Abnormal   Collection Time: 05/23/15  8:18 PM   Result Value Ref Range Status   Campylobacter species NOT DETECTED NOT DETECTED Final   Plesimonas shigelloides NOT DETECTED NOT DETECTED Final   Salmonella species NOT DETECTED NOT DETECTED Final   Yersinia enterocolitica NOT DETECTED NOT DETECTED Final   Vibrio species NOT DETECTED NOT DETECTED Final   Vibrio cholerae NOT DETECTED NOT DETECTED Final   Enteroaggregative E coli (EAEC) NOT DETECTED NOT DETECTED Final   Enteropathogenic E coli (EPEC) NOT DETECTED NOT DETECTED Final   Enterotoxigenic E coli (ETEC) DETECTED (A) NOT DETECTED Final    Comment: CRITICAL RESULT CALLED TO, READ BACK BY AND VERIFIED WITH: GINA HICKS AT 1516 05/24/15 BY [facility]    Shiga like toxin producing E coli (STEC) DETECTED (A) NOT DETECTED Final    Comment: CRITICAL RESULT CALLED TO, READ BACK BY AND VERIFIED WITH: GINA HICKS AT 1516 05/24/15 BY [facility]    E. coli O157 NOT DETECTED NOT DETECTED Final   Shigella/Enteroinvasive E coli (EIEC) NOT DETECTED NOT DETECTED Final   Cryptosporidium NOT DETECTED NOT DETECTED Final   Cyclospora cayetanensis NOT DETECTED NOT DETECTED Final   Entamoeba histolytica NOT DETECTED NOT DETECTED Final   Giardia lamblia NOT DETECTED NOT DETECTED Final   Adenovirus F40/41 NOT DETECTED NOT DETECTED Final   Astrovirus NOT DETECTED NOT DETECTED Final   Norovirus GI/GII NOT DETECTED NOT DETECTED Final   Rotavirus A NOT DETECTED NOT DETECTED Final   Sapovirus (I, II, IV, and V) NOT DETECTED NOT DETECTED Final     Labs: Basic Metabolic Panel:  Recent Labs Lab 05/22/15 1903 05/23/15 0442 05/24/15 0725 05/25/15 0925  NA 137 139 141 141  K 4.4 4.8 4.4 3.9  CL 104 108 110 105  CO2 19* 23 23 23   GLUCOSE 282* 197* 98 166*  BUN 20 22* 13 12  CREATININE 1.49* 1.44* 1.19 1.30*  CALCIUM 9.0 8.6* 9.0 9.8  MG  --   --  2.0 2.0   Liver Function Tests:  Recent Labs Lab 05/22/15 1903 05/23/15 0442 05/24/15 0725  AST 27 21 23   ALT 32 24 23  ALKPHOS 76 60 55  BILITOT 0.6 0.5 0.5   PROT 5.9* 4.7* 5.2*  ALBUMIN 3.6 3.0* 3.4*    Recent Labs Lab 05/22/15 1903  LIPASE 24   No results for input(s): AMMONIA in the last 168 hours. CBC:  Recent Labs Lab 05/22/15 1903 05/23/15 0442 05/24/15 0725 05/25/15 0925  WBC 10.5 5.2 3.9* 3.5*  NEUTROABS  --  4.4 2.7 2.6  HGB 17.0 12.9* 12.8* 14.0  HCT 49.6 38.9* 38.0* 41.8  MCV 91.7 92.4 94.3 92.1  PLT 181 137* 121* 145*   Cardiac Enzymes: No results for input(s): CKTOTAL, CKMB, CKMBINDEX, TROPONINI in the last 168 hours. BNP: BNP (last 3 results) No results for input(s): BNP in the last 8760 hours.  ProBNP (last 3 results) No results for input(s): PROBNP in the last 8760 hours.  CBG:  Recent Labs Lab 05/24/15 1115 05/24/15 1650 05/24/15 2202 05/25/15 0803 05/25/15 1144  GLUCAP 118* 88 89 111* 147*    Time coordinating discharge: 35 minutes  Signed:  Eain Mullendore  [facility] 05/25/2015, 1:29 PM

## 2015-06-01 DIAGNOSIS — A498 Other bacterial infections of unspecified site: Secondary | ICD-10-CM | POA: Diagnosis not present

## 2015-06-01 DIAGNOSIS — A09 Infectious gastroenteritis and colitis, unspecified: Secondary | ICD-10-CM | POA: Diagnosis not present

## 2015-06-08 ENCOUNTER — Ambulatory Visit (INDEPENDENT_AMBULATORY_CARE_PROVIDER_SITE_OTHER): Payer: Medicare Other | Admitting: Cardiovascular Disease

## 2015-06-08 ENCOUNTER — Encounter: Payer: Self-pay | Admitting: Cardiovascular Disease

## 2015-06-08 VITALS — BP 138/86 | HR 66 | Ht 73.0 in | Wt 198.0 lb

## 2015-06-08 DIAGNOSIS — I2510 Atherosclerotic heart disease of native coronary artery without angina pectoris: Secondary | ICD-10-CM | POA: Diagnosis not present

## 2015-06-08 DIAGNOSIS — R002 Palpitations: Secondary | ICD-10-CM | POA: Diagnosis not present

## 2015-06-08 DIAGNOSIS — I10 Essential (primary) hypertension: Secondary | ICD-10-CM

## 2015-06-08 DIAGNOSIS — E785 Hyperlipidemia, unspecified: Secondary | ICD-10-CM

## 2015-06-08 DIAGNOSIS — I214 Non-ST elevation (NSTEMI) myocardial infarction: Secondary | ICD-10-CM

## 2015-06-08 NOTE — Assessment & Plan Note (Signed)
History of hypertension blood pressure measured at 138/86. He is on lisinopril and metoprolol. Continue current meds at current dosing

## 2015-06-08 NOTE — Assessment & Plan Note (Addendum)
History of hyperlipidemia on Livalo with recent lipid profile performed 05/24/15 revealed a total cholesterol 118, LDL 48 and HDL of 32

## 2015-06-08 NOTE — Assessment & Plan Note (Signed)
History of non-STEMI status post cardiac catheterization by myself 02/22/15 angiographically documented high-grade proximal and mid LAD disease as well as disease in the first obtuse marginal branch and mid to distal AV groove circumflex. I put in 2 drug-eluting stents in his proximal mid LAD. His EF was 45-50% at that time. Subsequent 2-D echo performed 02/23/15 revealed an EF of 45-50% and subsequent Myoview stress test performed 04/14/15 to determine physiological significance of his remaining CAD was nonischemic. He denies chest pain or shortness of breath.

## 2015-06-08 NOTE — Patient Instructions (Addendum)
Medication Instructions:  Your physician recommends that you continue on your current medications as directed. Please refer to the Current Medication list given to you today.   Labwork: none  Testing/Procedures: none  Follow-Up: We request that you follow-up in: 6 months with Tenny Craw, PA and in 12 months with Dr Andria Rhein will receive a reminder letter in the mail two months in advance. If you don't receive a letter, please call our office to schedule the follow-up appointment.    Any Other Special Instructions Will Be Listed Below (If Applicable).     If you need a refill on your cardiac medications before your next appointment, please call your pharmacy.

## 2015-06-08 NOTE — Progress Notes (Signed)
06/08/2015 [name]   06/04/1940  [id number]  Primary Physician [name], NP Primary Cardiologist: Lorretta Harp MD Renae Gloss   HPI:  Daniel Herrera is a 75 year old gentleman without prior heart disease. Does have prostate cancer. He's had been having chest pain for a month, worse yesterday and prolonged the day of presentation 02/22/15.[name] He presented to the emergency room where his EKG showed anterolateral T-wave inversion. His troponin level was 0.7. He was given sublingual nitroglycerin with some improvement in his symptoms but because of ongoing chest pain he was decided to bring him to the Cath Lab for urgent angiography and potential intervention. Problems include history of hypertension and hyperlipidemia. I brought him to the cath lab revealing high-grade proximal and mid LAD disease which I stented using 2 drug-eluting stents. He did have moderate first obtuse marginal branch and mid to distal AV groove circumflex disease with an EF of 45-50%. Myoview stress test performed 04/14/15 was nonischemic. He has complained of palpitations and a event monitor showed only PVCs. He currently denies chest pain or shortness of breath. He remains on antibiotic therapy with aspirin and Brilenta. His lipid profile is excellent on statin therapy.   Current Outpatient Prescriptions  Medication Sig Dispense Refill  . aspirin 81 MG chewable tablet Chew 1 tablet (81 mg total) by mouth daily.    . clobetasol ointment (TEMOVATE) [id number] % Apply 1 application topically daily as needed (cellulitis on legs).   1  . dutasteride (AVODART) 0.5 MG capsule Take 1 capsule (0.5 mg total) by mouth every evening. (Patient taking differently: Take 0.5 mg by mouth at bedtime. ) 30 capsule 5  . famotidine (PEPCID) 20 MG tablet Take 1 tablet (20 mg total) by mouth 2 (two) times daily as needed for heartburn or indigestion. 60 tablet 0  . fluticasone (VERAMYST) 27.5 MCG/SPRAY nasal spray Place 1 spray  into both nostrils daily.     [name] levothyroxine (SYNTHROID, LEVOTHROID) 75 MCG tablet Take 75 mcg by mouth daily before breakfast.    . lisinopril (PRINIVIL,ZESTRIL) 10 MG tablet Take 10 mg by mouth daily.    . metoprolol tartrate (LOPRESSOR) 25 MG tablet Take 1 tablet (25 mg total) by mouth 2 (two) times daily. 90 tablet 6  . nitroGLYCERIN (NITROSTAT) 0.4 MG SL tablet DISSOLVE 1 TABLET UNDER TONGUE EVERY 5 MINUTES FOR 3 DOSES AS NEEDED CHEST PAIN. IF NO RELIEF CALL 911 75 tablet 2  . nystatin (MYCOSTATIN) 100000 UNIT/ML suspension Take 2.5 mLs by mouth at bedtime as needed (thrush). Swish and spit  1  . Pitavastatin Calcium (LIVALO) 4 MG TABS Take 4 mg by mouth at bedtime.     Vladimir Faster Glycol-Propyl Glycol (SYSTANE OP) Place 1 drop into both eyes 2 (two) times daily.    [name] saccharomyces boulardii (FLORASTOR) 250 MG capsule Take 1 capsule (250 mg total) by mouth 2 (two) times daily. 60 capsule 3  . ticagrelor (BRILINTA) 90 MG TABS tablet Take 1 tablet (90 mg total) by mouth 2 (two) times daily. 60 tablet 10   No current facility-administered medications for this visit.    Allergies  Allergen Reactions  . Benadryl [Diphenhydramine Hcl] Swelling    Facial swelling from high dose (tolerates Advil PM as needed)  . Darvocet [Propoxyphene N-Acetaminophen] Other (See Comments)    hallucination  . Darvon [Propoxyphene Hcl] Other (See Comments)    Hallucination   . Tylenol [Acetaminophen] Swelling    Foot swelling    Social History  Social History  . Marital Status: Married    Spouse Name: N/A  . Number of Children: N/A  . Years of Education: N/A   Occupational History  . retired    Social History Main Topics  . Smoking status: Never Smoker   . Smokeless tobacco: Never Used  . Alcohol Use: No  . Drug Use: No  . Sexual Activity: Not on file   Other Topics Concern  . Not on file   Social History Narrative     Review of Systems: General: negative for chills, fever, night  sweats or weight changes.  Cardiovascular: negative for chest pain, dyspnea on exertion, edema, orthopnea, palpitations, paroxysmal nocturnal dyspnea or shortness of breath Dermatological: negative for rash Respiratory: negative for cough or wheezing Urologic: negative for hematuria Abdominal: negative for nausea, vomiting, diarrhea, bright red blood per rectum, melena, or hematemesis Neurologic: negative for visual changes, syncope, or dizziness All other systems reviewed and are otherwise negative except as noted above.    Blood pressure 138/86, pulse 66, height 6\' 1"  (1.854 m), weight 198 lb (89.812 kg).  General appearance: alert and no distress Neck: no adenopathy, no carotid bruit, no JVD, supple, symmetrical, trachea midline and thyroid not enlarged, symmetric, no tenderness/mass/nodules Lungs: clear to auscultation bilaterally Heart: regular rate and rhythm, S1, S2 normal, no murmur, click, rub or gallop Extremities: extremities normal, atraumatic, no cyanosis or edema  EKG not performed today  ASSESSMENT AND PLAN:   NSTEMI (non-ST elevated myocardial infarction) ([place]) History of non-STEMI status post cardiac catheterization by myself 02/22/15 angiographically documented high-grade proximal and mid LAD disease as well as disease in the first obtuse marginal branch and mid to distal AV groove circumflex. I put in 2 drug-eluting stents in his proximal mid LAD. His EF was 45-50% at that time. Subsequent 2-D echo performed 02/23/15 revealed an EF of 45-50% and subsequent Myoview stress test performed 04/14/15 to determine physiological significance of his remaining CAD was nonischemic. He denies chest pain or shortness of breath.  Essential hypertension History of hypertension blood pressure measured at 138/86. He is on lisinopril and metoprolol. Continue current meds at current dosing  Hyperlipidemia History of hyperlipidemia on Livalo with recent lipid profile performed 05/24/15  revealed a total cholesterol 118, LDL 48 and HDL of 32  Palpitations History of palpitations with recent event monitor showing only PVCs. These are intermittent. He is on a beta blocker.      Lorretta Harp MD FACP,FACC,FAHA, [facility] 06/08/2015 3:54 PM

## 2015-06-08 NOTE — Assessment & Plan Note (Signed)
History of palpitations with recent event monitor showing only PVCs. These are intermittent. He is on a beta blocker.

## 2015-06-09 DIAGNOSIS — A058 Other specified bacterial foodborne intoxications: Secondary | ICD-10-CM | POA: Diagnosis not present

## 2015-06-09 DIAGNOSIS — I10 Essential (primary) hypertension: Secondary | ICD-10-CM | POA: Diagnosis not present

## 2015-06-09 DIAGNOSIS — E119 Type 2 diabetes mellitus without complications: Secondary | ICD-10-CM | POA: Diagnosis not present

## 2015-06-09 DIAGNOSIS — I2510 Atherosclerotic heart disease of native coronary artery without angina pectoris: Secondary | ICD-10-CM | POA: Diagnosis not present

## 2015-06-09 DIAGNOSIS — R351 Nocturia: Secondary | ICD-10-CM | POA: Diagnosis not present

## 2015-06-09 DIAGNOSIS — D510 Vitamin B12 deficiency anemia due to intrinsic factor deficiency: Secondary | ICD-10-CM | POA: Diagnosis not present

## 2015-08-11 DIAGNOSIS — I10 Essential (primary) hypertension: Secondary | ICD-10-CM | POA: Diagnosis not present

## 2015-08-11 DIAGNOSIS — E119 Type 2 diabetes mellitus without complications: Secondary | ICD-10-CM | POA: Diagnosis not present

## 2015-08-11 DIAGNOSIS — L959 Vasculitis limited to the skin, unspecified: Secondary | ICD-10-CM | POA: Diagnosis not present

## 2015-08-11 DIAGNOSIS — R1013 Epigastric pain: Secondary | ICD-10-CM | POA: Diagnosis not present

## 2015-08-11 DIAGNOSIS — M1990 Unspecified osteoarthritis, unspecified site: Secondary | ICD-10-CM | POA: Diagnosis not present

## 2015-08-11 DIAGNOSIS — E039 Hypothyroidism, unspecified: Secondary | ICD-10-CM | POA: Diagnosis not present

## 2015-08-11 DIAGNOSIS — D510 Vitamin B12 deficiency anemia due to intrinsic factor deficiency: Secondary | ICD-10-CM | POA: Diagnosis not present

## 2015-08-11 DIAGNOSIS — E785 Hyperlipidemia, unspecified: Secondary | ICD-10-CM | POA: Diagnosis not present

## 2015-08-11 DIAGNOSIS — Z6826 Body mass index (BMI) 26.0-26.9, adult: Secondary | ICD-10-CM | POA: Diagnosis not present

## 2015-08-16 ENCOUNTER — Ambulatory Visit (INDEPENDENT_AMBULATORY_CARE_PROVIDER_SITE_OTHER): Payer: Medicare Other | Admitting: Pulmonary Disease

## 2015-08-16 ENCOUNTER — Encounter: Payer: Self-pay | Admitting: Pulmonary Disease

## 2015-08-16 VITALS — BP 134/82 | HR 59 | Ht 73.0 in | Wt 197.6 lb

## 2015-08-16 DIAGNOSIS — I2510 Atherosclerotic heart disease of native coronary artery without angina pectoris: Secondary | ICD-10-CM | POA: Diagnosis not present

## 2015-08-16 DIAGNOSIS — J84114 Acute interstitial pneumonitis: Secondary | ICD-10-CM | POA: Diagnosis not present

## 2015-08-16 NOTE — Patient Instructions (Signed)
Scarring in your lungs is stable from 2015 Call us for worsening cough, pneumonias or breathing

## 2015-08-16 NOTE — Assessment & Plan Note (Signed)
Scarring in your lungs is stable from 2015 -no evidence of disease progression over the last 2 years Call us for worsening cough, pneumonias or breathing  I reviewed his CT abdomen from February 2017

## 2015-08-16 NOTE — Progress Notes (Signed)
   Subjective:    Patient ID: Daniel Herrera, male    DOB: 05-28-1940, 75 y.o.   MRN: [id number]  HPI  Ref - [name] , [place], [facility]  75 year old never smoker for FU of interstitial pneumonia He reports dyspnea onexertion x 6 months. He presented in 11/2013 with hemoptysis, reports less than 1 teaspoon of bright red blood on coughing that persisted for a few days. Chest x-ray suggested a nodular infiltrate in the left lung.     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 He underwent evaluation many years ago in [place] and was told that he had scarring in his lungs. He is retired and does not report any other environmental exposures. No skin lesions or arthritis, hemoptysis has not recurred Past medical history-includes  C diff colitis and SVTs that he reports stop with vagal maneuvers.    08/16/2015  Chief Complaint  Patient presents with  . Follow-up    Pt feeling fine, no complaints.   One-year follow-up He denies cough wheezing dyspnea or recurrent pneumonia He was admitted for colitis in February 2017 and review of CT abdomen films-shows that the lung bases are clear   He was diagnosed in 2016  with prostate cancer , bone scan neg for mets.   Heartburn continues to be an issue for him  Significant tests/ events  CT chest without contrast 11/2013  showed interstitial prominence particularly apical with mild bronchiectasis, small patchy infiltrate was noted in the right lower lobe with a tiny effusion. ESR ,CCP neg PFTs 02/2014 - nml FEV1/ FVC & ratio, TLC 81%, DLCO 65%  Review of Systems Patient denies significant dyspnea,cough, hemoptysis,  chest pain, palpitations, pedal edema, orthopnea, paroxysmal nocturnal dyspnea, lightheadedness, nausea, vomiting, abdominal or  leg pains      Objective:   Physical Exam  Gen. Pleasant, well-nourished, in no distress ENT - no lesions,  no post nasal drip Neck: No JVD, no thyromegaly, no carotid bruits Lungs: no use of accessory muscles, no dullness to percussion, clear without rales or rhonchi  Cardiovascular: Rhythm regular, heart sounds  normal, no murmurs or gallops, no peripheral edema Musculoskeletal: No deformities, no cyanosis or clubbing        Assessment & Plan:

## 2015-09-08 DIAGNOSIS — C61 Malignant neoplasm of prostate: Secondary | ICD-10-CM | POA: Diagnosis not present

## 2015-11-18 DIAGNOSIS — E785 Hyperlipidemia, unspecified: Secondary | ICD-10-CM | POA: Diagnosis not present

## 2015-11-18 DIAGNOSIS — E119 Type 2 diabetes mellitus without complications: Secondary | ICD-10-CM | POA: Diagnosis not present

## 2015-11-18 DIAGNOSIS — J309 Allergic rhinitis, unspecified: Secondary | ICD-10-CM | POA: Diagnosis not present

## 2015-11-18 DIAGNOSIS — M1990 Unspecified osteoarthritis, unspecified site: Secondary | ICD-10-CM | POA: Diagnosis not present

## 2015-11-18 DIAGNOSIS — I10 Essential (primary) hypertension: Secondary | ICD-10-CM | POA: Diagnosis not present

## 2015-11-18 DIAGNOSIS — Z79899 Other long term (current) drug therapy: Secondary | ICD-10-CM | POA: Diagnosis not present

## 2015-11-18 DIAGNOSIS — I2510 Atherosclerotic heart disease of native coronary artery without angina pectoris: Secondary | ICD-10-CM | POA: Diagnosis not present

## 2015-12-24 ENCOUNTER — Telehealth: Payer: Self-pay | Admitting: Cardiovascular Disease

## 2015-12-24 NOTE — Telephone Encounter (Signed)
Spoke with pt, he is having a nose bleed about once weekly when he blows his nose. He also reports a lot of bruising on his arms and legs and also he has hoarseness all the time. He is wanting a follow up appointment to discuss changing from the brilinta to something else because it is approaching one year from his intervention. Follow up scheduled with PA on a day dr berry is in the office. asvised pt to use saline nasal spray for his nose bleeds. He reports he is on a nasal spray for allergies.

## 2015-12-24 NOTE — Telephone Encounter (Signed)
New Message  Pt call requesting to speak with RN. Pt states he would like to be seen by Dr. Gwenlyn Found as soon as possible. Pt states he is having nose bleeds. Pt also states every time he touches his skin he gets black bruises. Pt states he is on blood thinner, but is not normal to him and would like to be seen. Please call back to discuss

## 2015-12-28 ENCOUNTER — Encounter: Payer: Self-pay | Admitting: Physician Assistant

## 2015-12-28 ENCOUNTER — Ambulatory Visit (INDEPENDENT_AMBULATORY_CARE_PROVIDER_SITE_OTHER): Payer: Medicare Other | Admitting: Physician Assistant

## 2015-12-28 VITALS — BP 130/80 | HR 63 | Ht 73.0 in | Wt 189.8 lb

## 2015-12-28 DIAGNOSIS — I493 Ventricular premature depolarization: Secondary | ICD-10-CM | POA: Diagnosis not present

## 2015-12-28 DIAGNOSIS — I2510 Atherosclerotic heart disease of native coronary artery without angina pectoris: Secondary | ICD-10-CM

## 2015-12-28 DIAGNOSIS — R238 Other skin changes: Secondary | ICD-10-CM

## 2015-12-28 DIAGNOSIS — R233 Spontaneous ecchymoses: Secondary | ICD-10-CM

## 2015-12-28 NOTE — Progress Notes (Signed)
Cardiology Office Note   Date:  12/28/2015   ID:  Daniel Herrera, DOB 06-23-1940, MRN [id number]  PCP:  Daniel Peace, NP  Cardiologist:  Dr Gwenlyn Found, 06/2015  Rosaria Ferries, PA-C   Chief Complaint  Patient presents with  . Follow-up    pt to discuss side effects of blood thinners and having nose bleeds    History of Present Illness: Daniel Herrera is a 75 y.o. male with a history of NSTEMI 02/2015 w/ DES LAD, OM1, EF 45-50% by echo. MV 04/2015 w/ nonischemic, HTN, HLD, PVCs, prostate CA T1c rx w/ XRT, thyroid dz, prolonged QT  Daniel Herrera presents for Cardiology assessment and management  At first Daniel Herrera was doing pretty well. When summer came, he started spending more time outside. He notes that he bruises very easily and is 100. Easily as well. He is wearing compression socks and regular socks over those, but still gets wounds on his legs and multiple bruises. He also has multiple bruised areas on his arms. He feels like he can barely touch his skin and nipple bleed. He was living with all of this, but then developed intermittent nosebleeds. He states he will get a nosebleed approximately once a week. The nosebleed we will not happen in response to any trauma, he has woken up with them at times as well. He feels it is difficult to get the bleeding stopped.  He has not had chest pain or shortness of breath. 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 He is doing well with this.  He still gets frequent PVCs. He will feel the palpitations is 3 normal beats and then skipping a beat. At times, he will have the setting frequently that he feels bad. When he feels bad, he checks his blood pressure and his systolic blood pressure is 90. He will rest and then the next day will feel better. He drinks 3- 1/2 L bottles of green tea daily, no other source of caffeine. He does not use over-the-counter cold medications or other stimulants.  He is  compliant with his medications. He has not been lightheaded or dizzy. He has not had hematuria, melena, or gum bleeding. No bleeding with brushing his teeth.   Past Medical History:  Diagnosis Date  . Anxiety   . Arthritis   . CAD (coronary artery disease)    a. 02/2015: DES to mid-LAD  . GERD (gastroesophageal reflux disease)   . Gout   . High cholesterol   . History of prolonged Q-T interval on ECG   . Hx of Clostridium difficile infection   . Hx of umbilical hernia repair   . Personal history of digestive disease    gastric ulcer  . Prostate cancer ([place])   . Prostate enlargement   . Skin cancer    basal cell carcinoma  . Thyroid disease     Past Surgical History:  Procedure Laterality Date  . APPENDECTOMY    . BACK SURGERY    . CARDIAC CATHETERIZATION N/A 02/21/2015   Procedure: Left Heart Cath;  Surgeon: Lorretta Harp, MD;  Location: [facility];  Service: Cardiovascular;  Laterality: N/A;  . CATARACT EXTRACTION, BILATERAL    . NASAL SINUS SURGERY    . PROSTATE BIOPSY    . right shoulder rotator cuff repair      Current Outpatient Prescriptions  Medication Sig Dispense Refill  . aspirin 81 MG chewable tablet Chew  1 tablet (81 mg total) by mouth daily.    . clobetasol ointment (TEMOVATE) [id number] % Apply 1 application topically daily as needed (cellulitis on legs).   1  . dutasteride (AVODART) 0.5 MG capsule Take 1 capsule (0.5 mg total) by mouth every evening. (Patient taking differently: Take 0.5 mg by mouth at bedtime. ) 30 capsule 5  . famotidine (PEPCID) 20 MG tablet Take 1 tablet (20 mg total) by mouth 2 (two) times daily as needed for heartburn or indigestion. 60 tablet 0  . fluticasone (FLONASE) 50 MCG/ACT nasal spray Place 1 spray into both nostrils daily.    [name] levothyroxine (SYNTHROID, LEVOTHROID) 75 MCG tablet Take 75 mcg by mouth daily before breakfast.    . lisinopril (PRINIVIL,ZESTRIL) 10 MG tablet Take 10 mg by mouth daily.    . metoprolol tartrate  (LOPRESSOR) 25 MG tablet Take 1 tablet (25 mg total) by mouth 2 (two) times daily. 90 tablet 6  . nitroGLYCERIN (NITROSTAT) 0.4 MG SL tablet DISSOLVE 1 TABLET UNDER TONGUE EVERY 5 MINUTES FOR 3 DOSES AS NEEDED CHEST PAIN. IF NO RELIEF CALL 911 75 tablet 2  . nystatin (MYCOSTATIN) 100000 UNIT/ML suspension Take 2.5 mLs by mouth at bedtime as needed (thrush). Swish and spit  1  . oxybutynin (DITROPAN) 5 MG tablet TK 1 T PO  QHS FOR FREQUENT URINATION  1  . Pitavastatin Calcium (LIVALO) 4 MG TABS Take 4 mg by mouth at bedtime.     Daniel Herrera Glycol-Propyl Glycol (SYSTANE OP) Place 1 drop into both eyes 2 (two) times daily.    [name] saccharomyces boulardii (FLORASTOR) 250 MG capsule Take 1 capsule (250 mg total) by mouth 2 (two) times daily. 60 capsule 3  . ticagrelor (BRILINTA) 90 MG TABS tablet Take 1 tablet (90 mg total) by mouth 2 (two) times daily. 60 tablet 10   No current facility-administered medications for this visit.     Allergies:   Benadryl [diphenhydramine hcl]; Darvocet [propoxyphene n-acetaminophen]; Darvon [propoxyphene hcl]; and Tylenol [acetaminophen]    Social History:  The patient  reports that he has never smoked. He has never used smokeless tobacco. He reports that he does not drink alcohol or use drugs.   Family History:  The patient's family history includes Cancer in his father, mother, and sister.    ROS:  Please see the history of present illness. All other systems are reviewed and negative.    PHYSICAL EXAM: VS:  BP 130/80 (BP Location: Right Arm, Patient Position: Sitting, Cuff Size: Normal)   Pulse 63   Ht 6\' 1"  (1.854 m)   Wt 189 lb 12.8 oz (86.1 kg)   SpO2 99%   BMI 25.04 kg/m  , BMI Body mass index is 25.04 kg/m. GEN: Well nourished, well developed, male in no acute distress  HEENT: normal for age  Neck: no JVD, no carotid bruit, no masses Cardiac: RRR; no murmur, no rubs, or gallops Respiratory:  clear to auscultation bilaterally, normal work of  breathing GI: soft, nontender, nondistended, + BS MS: no deformity or atrophy; no edema; distal pulses are 2+ in all 4 extremities   Skin: warm and dry, no rash Neuro:  Strength and sensation are intact Psych: euthymic mood, full affect   EKG:  EKG is ordered today. The ekg ordered today demonstrates sinus rhythm, rate 63 with a first-degree AV block, P-R interval 210 ms Previous P-R intervals 198 ms when not tachycardic   Recent Labs: 04/02/2015: TSH 0.988 05/24/2015: ALT 23 05/25/2015: BUN  12; Creatinine, Ser 1.30; Hemoglobin 14.0; Magnesium 2.0; Platelets 145; Potassium 3.9; Sodium 141    Lipid Panel    Component Value Date/Time   CHOL 118 05/24/2015 0725   TRIG 189 (H) 05/24/2015 0725   HDL 32 (L) 05/24/2015 0725   CHOLHDL 3.7 05/24/2015 0725   VLDL 38 05/24/2015 0725   LDLCALC 48 05/24/2015 0725     Wt Readings from Last 3 Encounters:  12/28/15 189 lb 12.8 oz (86.1 kg)  08/16/15 197 lb 9.6 oz (89.6 kg)  06/08/15 198 lb (89.8 kg)     Other studies Reviewed: Additional studies/ records that were reviewed today include: Hospital records, office notes and testing.  ASSESSMENT AND PLAN:  1.  CAD: He is having no ongoing ischemic symptoms. He is to continue aspirin, metoprolol, lisinopril, pitavastatin, nitroglycerin when necessary and Brilinta.  2. Easy bruising: He has had several nosebleeds, and has several areas of ecchymosis, with minimal to no trauma per the patient. He does not use over-the-counter NSAIDs and takes no aspirin besides his 81 mg tablet. I offered a switch from Brilinta to Plavix, but at one year, he may be able to come off the Brilinta completely. At this time, he prefers to continue the Brilinta for now, call us if the bleeding gets any worse, and discuss with Dr. Gwenlyn Found stopping the Brilinta completely after year. Conjunctiva are pink and he does not have any signs or symptoms of anemia.  3. PVCs: Sometimes they are happening frequently enough to give  him symptoms. Most the time they do not. I offered him an increase in his metoprolol dose to 37.5 mg twice a day, taking one tablet 3 times a day, or taking an extra tablet as needed when he gets the frequent palpitations although his blood pressure may not tolerate it. At this time, he prefers not to make any medication changes. If he continues to get episodes where the PVCs are happening frequently enough that his blood pressure drops, we will get an event monitor and increase his beta blocker.  4. Hyperlipidemia: He is on for tablet a statin and this is followed by his primary care physician. He had lab work done recently and is requested to get that back stop to [place] so we may have a copy.   Current medicines are reviewed at length with the patient today.  The patient does not have concerns regarding medicines.  The following changes have been made:  no change  Labs/ tests ordered today include:  No orders of the defined types were placed in this encounter.    Disposition:   FU with Dr. Gwenlyn Found in November to consider discontinuing Brilinta or changing it to Plavix.  Daniel Herrera  12/28/2015 8:18 AM    [place] Phone: [phone]; Fax: [phone]  This note was written with the assistance of speech recognition software. Please excuse any transcriptional errors.

## 2015-12-28 NOTE — Patient Instructions (Signed)
Medications:  Your physician recommends that you continue on your current medications as directed. Please refer to the Current Medication list given to you today.   Other Instructions:  Please have your Primary doctor fax your recent labwork including cholesterol to [phone] ATTN: Rosaria Ferries, PA-C/ Dr. Gwenlyn Found.   Follow-Up:  Your physician recommends that you schedule a follow-up appointment in: November with Dr. Gwenlyn Found. If you need a refill on your cardiac medications before your next appointment, please call your pharmacy.

## 2016-01-02 DIAGNOSIS — L259 Unspecified contact dermatitis, unspecified cause: Secondary | ICD-10-CM | POA: Diagnosis not present

## 2016-02-09 ENCOUNTER — Encounter: Payer: Self-pay | Admitting: *Deleted

## 2016-02-15 ENCOUNTER — Ambulatory Visit (INDEPENDENT_AMBULATORY_CARE_PROVIDER_SITE_OTHER): Payer: Medicare Other | Admitting: Cardiovascular Disease

## 2016-02-15 ENCOUNTER — Encounter: Payer: Self-pay | Admitting: Cardiovascular Disease

## 2016-02-15 VITALS — BP 138/80 | HR 55 | Ht 73.0 in | Wt 193.0 lb

## 2016-02-15 DIAGNOSIS — I2583 Coronary atherosclerosis due to lipid rich plaque: Secondary | ICD-10-CM | POA: Diagnosis not present

## 2016-02-15 DIAGNOSIS — I10 Essential (primary) hypertension: Secondary | ICD-10-CM

## 2016-02-15 DIAGNOSIS — Z5181 Encounter for therapeutic drug level monitoring: Secondary | ICD-10-CM

## 2016-02-15 DIAGNOSIS — I214 Non-ST elevation (NSTEMI) myocardial infarction: Secondary | ICD-10-CM | POA: Diagnosis not present

## 2016-02-15 DIAGNOSIS — E7800 Pure hypercholesterolemia, unspecified: Secondary | ICD-10-CM

## 2016-02-15 DIAGNOSIS — Z7902 Long term (current) use of antithrombotics/antiplatelets: Secondary | ICD-10-CM | POA: Diagnosis not present

## 2016-02-15 DIAGNOSIS — I2510 Atherosclerotic heart disease of native coronary artery without angina pectoris: Secondary | ICD-10-CM | POA: Diagnosis not present

## 2016-02-15 MED ORDER — CLOPIDOGREL BISULFATE 75 MG PO TABS: 75.0000 mg | ORAL_TABLET | Freq: Every day | ORAL | 3 refills | Status: DC

## 2016-02-15 NOTE — Patient Instructions (Signed)
Medication Instructions: STOP Brilinta  Start Plavix 75 mg daily.   Labwork: Your physician recommends that you have lab work in: 2 weeks at [facility]P2Y12. If you have any problems, please call [phone].   Follow-Up: Your physician wants you to follow-up in: 1 year with Dr. Gwenlyn Found. You will receive a reminder letter in the mail two months in advance. If you don't receive a letter, please call our office to schedule the follow-up appointment.   If you need a refill on your cardiac medications before your next appointment, please call your pharmacy.

## 2016-02-15 NOTE — Assessment & Plan Note (Signed)
History of hyperlipidemia on statin therapy with recent blood work performed by his PCP 11/19/15 revealed a total cholesterol 159, LDL 83 and HDL of 39.

## 2016-02-15 NOTE — Assessment & Plan Note (Signed)
History of CAD status post non-STEMI 02/22/15. He was brought urgently to the cath lab revealing high-grade proximal and mid LAD disease which I stented using 2 drug-eluting stents. He did have moderate first obtuse marginal branch and mid to distal AV groove circumflex disease with an EF of 45-50%. Myoview stress test performed 04/14/15 was nonischemic. He denies chest pain or shortness of breath. He currently is on low-dose aspirin and Brilenta. Since it's been a year since his intervention I'm going to discontinue Brilenta and begin him on Plavix 75 mg daily. We will check a verify now test to assess platelet reactivity to Plavix in 2-3 weeks.

## 2016-02-15 NOTE — Assessment & Plan Note (Signed)
History of hypertension blood pressure measured at 138/80. He is on lisinopril and metoprolol. Continue current meds are current dosing

## 2016-02-15 NOTE — Progress Notes (Signed)
02/15/2016 Daniel Herrera   1941-02-02  [id number]  Primary Physician Daniel Peace, NP Primary Cardiologist: Daniel Harp MD Daniel Herrera  HPI:  Daniel Herrera is a 75 year old gentleman without prior heart disease who I last saw in the office 06/08/15. He does have prostate cancer and has undergone radiation therapy followed by Daniel Herrera.[name] He's had been having chest pain for a month, worse  the day of presentation 02/22/15. He presented to the emergency room where his EKG showed anterolateral T-wave inversion. His troponin level was 0.7. He was given sublingual nitroglycerin with some improvement in his symptoms but because of ongoing chest pain he was decided to bring him to the Cath Lab for urgent angiography and potential intervention. Problems include history of hypertension and hyperlipidemia. I brought him to the cath lab revealing high-grade proximal and mid LAD disease which I stented using 2 drug-eluting stents. He did have moderate first obtuse marginal branch and mid to distal AV groove circumflex disease with an EF of 45-50%. Myoview stress test performed 04/14/15 was nonischemic. He has complained of palpitations and a event monitor showed only PVCs. He currently denies chest pain or shortness of breath. He remains on dual antiplatelet therapy with aspirin and Brilenta. His lipid profile is excellent on statin therapy recently checked by his PCP 11/21/15 revealed a total cholesterol 159, LDL 37 and HDL of 39.   Current Outpatient Prescriptions  Medication Sig Dispense Refill  . aspirin 81 MG chewable tablet Chew 1 tablet (81 mg total) by mouth daily.    . clobetasol ointment (TEMOVATE) [id number] % Apply 1 application topically daily as needed (cellulitis on legs).   1  . dutasteride (AVODART) 0.5 MG capsule Take 1 capsule (0.5 mg total) by mouth every evening. (Patient taking differently: Take 0.5 mg by mouth at bedtime. ) 30 capsule 5  . famotidine (PEPCID) 20 MG tablet  Take 1 tablet (20 mg total) by mouth 2 (two) times daily as needed for heartburn or indigestion. 60 tablet 0  . fluticasone (FLONASE) 50 MCG/ACT nasal spray Place 1 spray into both nostrils daily.    [name] levothyroxine (SYNTHROID, LEVOTHROID) 75 MCG tablet Take 75 mcg by mouth daily before breakfast.    . lisinopril (PRINIVIL,ZESTRIL) 10 MG tablet Take 10 mg by mouth daily.    . metoprolol tartrate (LOPRESSOR) 25 MG tablet Take 1 tablet (25 mg total) by mouth 2 (two) times daily. 90 tablet 6  . nitroGLYCERIN (NITROSTAT) 0.4 MG SL tablet DISSOLVE 1 TABLET UNDER TONGUE EVERY 5 MINUTES FOR 3 DOSES AS NEEDED CHEST PAIN. IF NO RELIEF CALL 911 75 tablet 2  . nystatin (MYCOSTATIN) 100000 UNIT/ML suspension Take 2.5 mLs by mouth at bedtime as needed (thrush). Swish and spit  1  . oxybutynin (DITROPAN) 5 MG tablet TK 1 T PO  QHS FOR FREQUENT URINATION  1  . Pitavastatin Calcium (LIVALO) 4 MG TABS Take 4 mg by mouth at bedtime.     Daniel Herrera Glycol-Propyl Glycol (SYSTANE OP) Place 1 drop into both eyes 2 (two) times daily.    [name] saccharomyces boulardii (FLORASTOR) 250 MG capsule Take 1 capsule (250 mg total) by mouth 2 (two) times daily. 60 capsule 3  . clopidogrel (PLAVIX) 75 MG tablet Take 1 tablet (75 mg total) by mouth daily. 90 tablet 3   No current facility-administered medications for this visit.     Allergies  Allergen Reactions  . Benadryl [Diphenhydramine Hcl] Swelling    Facial swelling  from high dose (tolerates Advil PM as needed)  . Darvocet [Propoxyphene N-Acetaminophen] Other (See Comments)    hallucination  . Darvon [Propoxyphene Hcl] Other (See Comments)    Hallucination   . Tylenol [Acetaminophen] Swelling    Foot swelling    Social History   Social History  . Marital status: Married    Spouse name: N/A  . Number of children: N/A  . Years of education: N/A   Occupational History  . retired    Social History Main Topics  . Smoking status: Never Smoker  . Smokeless  tobacco: Never Used  . Alcohol use No  . Drug use: No  . Sexual activity: Not on file   Other Topics Concern  . Not on file   Social History Narrative  . No narrative on file     Review of Systems: General: negative for chills, fever, night sweats or weight changes.  Cardiovascular: negative for chest pain, dyspnea on exertion, edema, orthopnea, palpitations, paroxysmal nocturnal dyspnea or shortness of breath Dermatological: negative for rash Respiratory: negative for cough or wheezing Urologic: negative for hematuria Abdominal: negative for nausea, vomiting, diarrhea, bright red blood per rectum, melena, or hematemesis Neurologic: negative for visual changes, syncope, or dizziness All other systems reviewed and are otherwise negative except as noted above.    Blood pressure 138/80, pulse (!) 55, height 6\' 1"  (1.854 m), weight 193 lb (87.5 kg).  General appearance: alert and no distress Neck: no adenopathy, no carotid bruit, no JVD, supple, symmetrical, trachea midline and thyroid not enlarged, symmetric, no tenderness/mass/nodules Lungs: clear to auscultation bilaterally Heart: regular rate and rhythm, S1, S2 normal, no murmur, click, rub or gallop Extremities: extremities normal, atraumatic, no cyanosis or edema  EKG not performed today  ASSESSMENT AND PLAN:   NSTEMI (non-ST elevated myocardial infarction) ([place]) History of CAD status post non-STEMI 02/22/15. He was brought urgently to the cath lab revealing high-grade proximal and mid LAD disease which I stented using 2 drug-eluting stents. He did have moderate first obtuse marginal branch and mid to distal AV groove circumflex disease with an EF of 45-50%. Myoview stress test performed 04/14/15 was nonischemic. He denies chest pain or shortness of breath. He currently is on low-dose aspirin and Brilenta. Since it's been a year since his intervention I'm going to discontinue Brilenta and begin him on Plavix 75 mg daily. We will  check a verify now test to assess platelet reactivity to Plavix in 2-3 weeks.  Essential hypertension History of hypertension blood pressure measured at 138/80. He is on lisinopril and metoprolol. Continue current meds are current dosing  Hyperlipidemia History of hyperlipidemia on statin therapy with recent blood work performed by his PCP 11/19/15 revealed a total cholesterol 159, LDL 83 and HDL of 39.      Daniel Harp MD FACP,FACC,FAHA, [facility] 02/15/2016 8:22 AM

## 2016-02-28 DIAGNOSIS — K219 Gastro-esophageal reflux disease without esophagitis: Secondary | ICD-10-CM | POA: Diagnosis not present

## 2016-02-28 DIAGNOSIS — E039 Hypothyroidism, unspecified: Secondary | ICD-10-CM | POA: Diagnosis not present

## 2016-02-28 DIAGNOSIS — Z1389 Encounter for screening for other disorder: Secondary | ICD-10-CM | POA: Diagnosis not present

## 2016-02-28 DIAGNOSIS — R531 Weakness: Secondary | ICD-10-CM | POA: Diagnosis not present

## 2016-02-28 DIAGNOSIS — Z2821 Immunization not carried out because of patient refusal: Secondary | ICD-10-CM | POA: Diagnosis not present

## 2016-02-28 DIAGNOSIS — D510 Vitamin B12 deficiency anemia due to intrinsic factor deficiency: Secondary | ICD-10-CM | POA: Diagnosis not present

## 2016-02-28 DIAGNOSIS — E785 Hyperlipidemia, unspecified: Secondary | ICD-10-CM | POA: Diagnosis not present

## 2016-02-28 DIAGNOSIS — I10 Essential (primary) hypertension: Secondary | ICD-10-CM | POA: Diagnosis not present

## 2016-02-28 DIAGNOSIS — E119 Type 2 diabetes mellitus without complications: Secondary | ICD-10-CM | POA: Diagnosis not present

## 2016-03-01 ENCOUNTER — Other Ambulatory Visit (HOSPITAL_COMMUNITY)
Admission: RE | Admit: 2016-03-01 | Discharge: 2016-03-01 | Disposition: A | Discharge status: Home / Self Care | Payer: Medicare Other | Source: Ambulatory Visit | Attending: Cardiovascular Disease | Admitting: Cardiovascular Disease

## 2016-03-01 DIAGNOSIS — Z5181 Encounter for therapeutic drug level monitoring: Secondary | ICD-10-CM | POA: Insufficient documentation

## 2016-03-01 DIAGNOSIS — Z7902 Long term (current) use of antithrombotics/antiplatelets: Secondary | ICD-10-CM | POA: Insufficient documentation

## 2016-03-01 LAB — PLATELET INHIBITION P2Y12: PLATELET FUNCTION P2Y12: 184 [PRU] — AB (ref 194–418)

## 2016-03-06 DIAGNOSIS — I8310 Varicose veins of unspecified lower extremity with inflammation: Secondary | ICD-10-CM | POA: Diagnosis not present

## 2016-03-06 DIAGNOSIS — L821 Other seborrheic keratosis: Secondary | ICD-10-CM | POA: Diagnosis not present

## 2016-03-06 DIAGNOSIS — L570 Actinic keratosis: Secondary | ICD-10-CM | POA: Diagnosis not present

## 2016-03-06 DIAGNOSIS — L578 Other skin changes due to chronic exposure to nonionizing radiation: Secondary | ICD-10-CM | POA: Diagnosis not present

## 2016-03-09 ENCOUNTER — Telehealth: Payer: Self-pay | Admitting: Cardiovascular Disease

## 2016-03-09 MED ORDER — PANTOPRAZOLE SODIUM 40 MG PO TBEC: 40.0000 mg | DELAYED_RELEASE_TABLET | Freq: Every day | ORAL | 11 refills | Status: DC

## 2016-03-09 NOTE — Telephone Encounter (Signed)
Left detailed message with pt daughter to call back--OK per DPR.  Received fax from [facility] stating pt is taking omeprazole and Plavix.  Per Erasmo Downer, pt needs to switch from Omeprazole (if taking) to pantoprazole 40 mg daily. Drug Interaction between Plavix and omeprazole.

## 2016-03-09 NOTE — Telephone Encounter (Signed)
Spoke to Mr. Basquez. He stated he is taking Omeprazole, but agreed to switching to pantoprazole per Erasmo Downer.  Sent in RX for Pantoprazole 40 mg daily to [facility] in [place].

## 2016-03-15 DIAGNOSIS — C61 Malignant neoplasm of prostate: Secondary | ICD-10-CM | POA: Diagnosis not present

## 2016-03-22 DIAGNOSIS — R351 Nocturia: Secondary | ICD-10-CM | POA: Diagnosis not present

## 2016-03-22 DIAGNOSIS — C61 Malignant neoplasm of prostate: Secondary | ICD-10-CM | POA: Diagnosis not present

## 2016-03-22 DIAGNOSIS — N401 Enlarged prostate with lower urinary tract symptoms: Secondary | ICD-10-CM | POA: Diagnosis not present

## 2016-03-22 DIAGNOSIS — R3915 Urgency of urination: Secondary | ICD-10-CM | POA: Diagnosis not present

## 2016-05-16 DIAGNOSIS — E039 Hypothyroidism, unspecified: Secondary | ICD-10-CM | POA: Diagnosis not present

## 2016-05-16 DIAGNOSIS — R590 Localized enlarged lymph nodes: Secondary | ICD-10-CM | POA: Diagnosis not present

## 2016-05-19 DIAGNOSIS — R599 Enlarged lymph nodes, unspecified: Secondary | ICD-10-CM | POA: Diagnosis not present

## 2016-05-19 DIAGNOSIS — R590 Localized enlarged lymph nodes: Secondary | ICD-10-CM | POA: Diagnosis not present

## 2016-05-30 DIAGNOSIS — Z0000 Encounter for general adult medical examination without abnormal findings: Secondary | ICD-10-CM | POA: Diagnosis not present

## 2016-05-30 DIAGNOSIS — Z1389 Encounter for screening for other disorder: Secondary | ICD-10-CM | POA: Diagnosis not present

## 2016-05-30 DIAGNOSIS — Z125 Encounter for screening for malignant neoplasm of prostate: Secondary | ICD-10-CM | POA: Diagnosis not present

## 2016-05-30 DIAGNOSIS — Z9181 History of falling: Secondary | ICD-10-CM | POA: Diagnosis not present

## 2016-05-30 DIAGNOSIS — Z136 Encounter for screening for cardiovascular disorders: Secondary | ICD-10-CM | POA: Diagnosis not present

## 2016-06-06 DIAGNOSIS — E785 Hyperlipidemia, unspecified: Secondary | ICD-10-CM | POA: Diagnosis not present

## 2016-06-06 DIAGNOSIS — M3500 Sicca syndrome, unspecified: Secondary | ICD-10-CM | POA: Diagnosis not present

## 2016-06-06 DIAGNOSIS — M1990 Unspecified osteoarthritis, unspecified site: Secondary | ICD-10-CM | POA: Diagnosis not present

## 2016-06-06 DIAGNOSIS — I10 Essential (primary) hypertension: Secondary | ICD-10-CM | POA: Diagnosis not present

## 2016-06-06 DIAGNOSIS — E119 Type 2 diabetes mellitus without complications: Secondary | ICD-10-CM | POA: Diagnosis not present

## 2016-06-06 DIAGNOSIS — I4940 Unspecified premature depolarization: Secondary | ICD-10-CM | POA: Diagnosis not present

## 2016-06-06 DIAGNOSIS — D510 Vitamin B12 deficiency anemia due to intrinsic factor deficiency: Secondary | ICD-10-CM | POA: Diagnosis not present

## 2016-06-26 DIAGNOSIS — H43813 Vitreous degeneration, bilateral: Secondary | ICD-10-CM | POA: Diagnosis not present

## 2016-06-26 DIAGNOSIS — M3500 Sicca syndrome, unspecified: Secondary | ICD-10-CM | POA: Diagnosis not present

## 2016-06-26 DIAGNOSIS — H52203 Unspecified astigmatism, bilateral: Secondary | ICD-10-CM | POA: Diagnosis not present

## 2016-06-26 DIAGNOSIS — Z961 Presence of intraocular lens: Secondary | ICD-10-CM | POA: Diagnosis not present

## 2016-07-12 DIAGNOSIS — Z6827 Body mass index (BMI) 27.0-27.9, adult: Secondary | ICD-10-CM | POA: Diagnosis not present

## 2016-07-12 DIAGNOSIS — R591 Generalized enlarged lymph nodes: Secondary | ICD-10-CM | POA: Diagnosis not present

## 2016-07-12 DIAGNOSIS — E663 Overweight: Secondary | ICD-10-CM | POA: Diagnosis not present

## 2016-07-12 DIAGNOSIS — M150 Primary generalized (osteo)arthritis: Secondary | ICD-10-CM | POA: Diagnosis not present

## 2016-09-20 DIAGNOSIS — C61 Malignant neoplasm of prostate: Secondary | ICD-10-CM | POA: Diagnosis not present

## 2016-09-27 DIAGNOSIS — R351 Nocturia: Secondary | ICD-10-CM | POA: Diagnosis not present

## 2016-09-27 DIAGNOSIS — N401 Enlarged prostate with lower urinary tract symptoms: Secondary | ICD-10-CM | POA: Diagnosis not present

## 2016-09-27 DIAGNOSIS — C61 Malignant neoplasm of prostate: Secondary | ICD-10-CM | POA: Diagnosis not present

## 2016-10-18 DIAGNOSIS — E785 Hyperlipidemia, unspecified: Secondary | ICD-10-CM | POA: Diagnosis not present

## 2016-10-18 DIAGNOSIS — I2510 Atherosclerotic heart disease of native coronary artery without angina pectoris: Secondary | ICD-10-CM | POA: Diagnosis not present

## 2016-10-18 DIAGNOSIS — E119 Type 2 diabetes mellitus without complications: Secondary | ICD-10-CM | POA: Diagnosis not present

## 2016-10-18 DIAGNOSIS — E039 Hypothyroidism, unspecified: Secondary | ICD-10-CM | POA: Diagnosis not present

## 2016-10-18 DIAGNOSIS — M3500 Sicca syndrome, unspecified: Secondary | ICD-10-CM | POA: Diagnosis not present

## 2016-10-18 DIAGNOSIS — D510 Vitamin B12 deficiency anemia due to intrinsic factor deficiency: Secondary | ICD-10-CM | POA: Diagnosis not present

## 2016-10-18 DIAGNOSIS — I10 Essential (primary) hypertension: Secondary | ICD-10-CM | POA: Diagnosis not present

## 2016-10-31 DIAGNOSIS — M25551 Pain in right hip: Secondary | ICD-10-CM | POA: Diagnosis not present

## 2017-01-22 DIAGNOSIS — R091 Pleurisy: Secondary | ICD-10-CM | POA: Diagnosis not present

## 2017-01-22 DIAGNOSIS — J069 Acute upper respiratory infection, unspecified: Secondary | ICD-10-CM | POA: Diagnosis not present

## 2017-02-03 ENCOUNTER — Other Ambulatory Visit: Payer: Self-pay | Admitting: Cardiovascular Disease

## 2017-02-14 ENCOUNTER — Ambulatory Visit (INDEPENDENT_AMBULATORY_CARE_PROVIDER_SITE_OTHER): Payer: Medicare Other | Admitting: Cardiovascular Disease

## 2017-02-14 ENCOUNTER — Encounter: Payer: Self-pay | Admitting: Cardiovascular Disease

## 2017-02-14 DIAGNOSIS — I214 Non-ST elevation (NSTEMI) myocardial infarction: Secondary | ICD-10-CM | POA: Diagnosis not present

## 2017-02-14 DIAGNOSIS — E7800 Pure hypercholesterolemia, unspecified: Secondary | ICD-10-CM

## 2017-02-14 NOTE — Assessment & Plan Note (Signed)
History of essential hypertension blood pressure is 114/62. He is on lisinopril and metoprolol. Continue current meds occurred dosing.

## 2017-02-14 NOTE — Assessment & Plan Note (Signed)
History of CAD status post non-STEMI presenting 02/22/15 with chest pain, EKG changes are mildly positive troponins. I took the cath lab and found high-grade proximal and mid LAD disease which I stented using 2 drug-eluting stents. He did have moderate first obtuse marginal branch and mid to distal AV groove circumflex disease with an EF of 45-50%. Subsequent Myoview stress test performed 04/14/15 was nonischemic and he denies chest pain or shortness of breath.

## 2017-02-14 NOTE — Patient Instructions (Signed)
Medication Instructions:  Continue current medications  If you need a refill on your cardiac medications before your next appointment, please call your pharmacy.  Labwork: None Ordered   Testing/Procedures: None Ordered  Follow-Up: Your physician wants you to follow-up in: 1 Year. You should receive a reminder letter in the mail two months in advance. If you do not receive a letter, please call our office [phone].    Thank you for choosing [facility]!!      

## 2017-02-14 NOTE — Progress Notes (Signed)
02/14/2017 Daniel Herrera   [birth year]  [id number]  Primary Physician [name], NP Primary Cardiologist: Lorretta Harp MD FACP, [place], [place], [place]  HPI:  Daniel Herrera is a 76 y.o.  gentleman without prior heart disease who I last saw in the office 02/15/16. He does have prostate cancer and has undergone radiation therapy followed by Dr. Alinda Money.[name] He's had been having chest pain for a month, worse  the day of presentation 02/22/15. He presented to the emergency room where his EKG showed anterolateral T-wave inversion. His troponin level was 0.7. He was given sublingual nitroglycerin with some improvement in his symptoms but because of ongoing chest pain he was decided to bring him to the Cath Lab for urgent angiography and potential intervention. Problems include history of hypertension and hyperlipidemia. I brought him to the cath lab revealing high-grade proximal and mid LAD disease which I stented using 2 drug-eluting stents. He did have moderate first obtuse marginal branch and mid to distal AV groove circumflex disease with an EF of 45-50%. Myoview stress test performed 04/14/15 was nonischemic. He has complained of palpitations and a event monitor showed only PVCs. He currently denies chest pain or shortness of breath. He remains on dual antiplatelet therapy with aspirin and clopidogrel.   Current Meds  Medication Sig  . aspirin 81 MG chewable tablet Chew 1 tablet (81 mg total) by mouth daily.  . clobetasol ointment (TEMOVATE) 7.41 % Apply 1 application topically daily as needed (cellulitis on legs).   . clopidogrel (PLAVIX) 75 MG tablet TAKE 1 TABLET(75 MG) BY MOUTH DAILY  . famotidine (PEPCID) 20 MG tablet Take 1 tablet (20 mg total) by mouth 2 (two) times daily as needed for heartburn or indigestion.  . fluticasone (FLONASE) 50 MCG/ACT nasal spray Place 1 spray into both nostrils daily.  [name] levothyroxine (SYNTHROID, LEVOTHROID) 75 MCG tablet Take 75 mcg by mouth daily before  breakfast.  . lisinopril (PRINIVIL,ZESTRIL) 10 MG tablet Take 10 mg by mouth daily.  . metoprolol tartrate (LOPRESSOR) 25 MG tablet Take 1 tablet (25 mg total) by mouth 2 (two) times daily.  . nabumetone (RELAFEN) 750 MG tablet Take 1 tablet 2 (two) times daily by mouth.  . nitroGLYCERIN (NITROSTAT) 0.4 MG SL tablet DISSOLVE 1 TABLET UNDER TONGUE EVERY 5 MINUTES FOR 3 DOSES AS NEEDED CHEST PAIN. IF NO RELIEF CALL 911  . nystatin (MYCOSTATIN) 100000 UNIT/ML suspension Take 2.5 mLs by mouth at bedtime as needed (thrush). Swish and spit  . pantoprazole (PROTONIX) 40 MG tablet Take 1 tablet (40 mg total) by mouth daily.  . Pitavastatin Calcium (LIVALO) 4 MG TABS Take 4 mg by mouth at bedtime.   Vladimir Faster Glycol-Propyl Glycol (SYSTANE OP) Place 1 drop into both eyes 2 (two) times daily.  [name] saccharomyces boulardii (FLORASTOR) 250 MG capsule Take 1 capsule (250 mg total) by mouth 2 (two) times daily.     Allergies  Allergen Reactions  . Benadryl [Diphenhydramine Hcl] Swelling    Facial swelling from high dose (tolerates Advil PM as needed)  . Darvocet [Propoxyphene N-Acetaminophen] Other (See Comments)    hallucination  . Darvon [Propoxyphene Hcl] Other (See Comments)    Hallucination   . Tylenol [Acetaminophen] Swelling    Foot swelling    Social History   Socioeconomic History  . Marital status: Married    Spouse name: Not on file  . Number of children: Not on file  . Years of education: Not on file  .  Highest education level: Not on file  Social Needs  . Financial resource strain: Not on file  . Food insecurity - worry: Not on file  . Food insecurity - inability: Not on file  . Transportation needs - medical: Not on file  . Transportation needs - non-medical: Not on file  Occupational History  . Occupation: retired  Tobacco Use  . Smoking status: Never Smoker  . Smokeless tobacco: Never Used  Substance and Sexual Activity  . Alcohol use: No  . Drug use: No  . Sexual  activity: Not on file  Other Topics Concern  . Not on file  Social History Narrative  . Not on file     Review of Systems: General: negative for chills, fever, night sweats or weight changes.  Cardiovascular: negative for chest pain, dyspnea on exertion, edema, orthopnea, palpitations, paroxysmal nocturnal dyspnea or shortness of breath Dermatological: negative for rash Respiratory: negative for cough or wheezing Urologic: negative for hematuria Abdominal: negative for nausea, vomiting, diarrhea, bright red blood per rectum, melena, or hematemesis Neurologic: negative for visual changes, syncope, or dizziness All other systems reviewed and are otherwise negative except as noted above.    Blood pressure 114/62, pulse (!) 54, height 6\' 1"  (1.854 m), weight 200 lb 9.6 oz (91 kg).  General appearance: alert and no distress Neck: no adenopathy, no carotid bruit, no JVD, supple, symmetrical, trachea midline and thyroid not enlarged, symmetric, no tenderness/mass/nodules Lungs: clear to auscultation bilaterally Heart: regular rate and rhythm, S1, S2 normal, no murmur, click, rub or gallop Extremities: extremities normal, atraumatic, no cyanosis or edema Pulses: 2+ and symmetric Skin: Skin color, texture, turgor normal. No rashes or lesions Neurologic: Alert and oriented X 3, normal strength and tone. Normal symmetric reflexes. Normal coordination and gait  EKG sinus bradycardia 54 without ST or T-wave changes. I personally reviewed this EKG.  ASSESSMENT AND PLAN:   NSTEMI (non-ST elevated myocardial infarction) ([place]) History of CAD status post non-STEMI presenting 02/22/15 with chest pain, EKG changes are mildly positive troponins. I took the cath lab and found high-grade proximal and mid LAD disease which I stented using 2 drug-eluting stents. He did have moderate first obtuse marginal branch and mid to distal AV groove circumflex disease with an EF of 45-50%. Subsequent Myoview stress  test performed 04/14/15 was nonischemic and he denies chest pain or shortness of breath.  Essential hypertension History of essential hypertension blood pressure is 114/62. He is on lisinopril and metoprolol. Continue current meds occurred dosing.  Hyperlipidemia History of hyperlipidemia on statin therapy followed by his PCP      Lorretta Harp MD [facility], [facility] 02/14/2017 10:01 AM

## 2017-02-14 NOTE — Assessment & Plan Note (Signed)
History of hyperlipidemia on statin therapy followed by his PCP 

## 2017-02-28 DIAGNOSIS — K589 Irritable bowel syndrome without diarrhea: Secondary | ICD-10-CM | POA: Diagnosis not present

## 2017-02-28 DIAGNOSIS — E785 Hyperlipidemia, unspecified: Secondary | ICD-10-CM | POA: Diagnosis not present

## 2017-02-28 DIAGNOSIS — Z2821 Immunization not carried out because of patient refusal: Secondary | ICD-10-CM | POA: Diagnosis not present

## 2017-02-28 DIAGNOSIS — I10 Essential (primary) hypertension: Secondary | ICD-10-CM | POA: Diagnosis not present

## 2017-02-28 DIAGNOSIS — M1990 Unspecified osteoarthritis, unspecified site: Secondary | ICD-10-CM | POA: Diagnosis not present

## 2017-02-28 DIAGNOSIS — K219 Gastro-esophageal reflux disease without esophagitis: Secondary | ICD-10-CM | POA: Diagnosis not present

## 2017-02-28 DIAGNOSIS — Z6827 Body mass index (BMI) 27.0-27.9, adult: Secondary | ICD-10-CM | POA: Diagnosis not present

## 2017-02-28 DIAGNOSIS — E119 Type 2 diabetes mellitus without complications: Secondary | ICD-10-CM | POA: Diagnosis not present

## 2017-04-18 DIAGNOSIS — L570 Actinic keratosis: Secondary | ICD-10-CM | POA: Diagnosis not present

## 2017-04-18 DIAGNOSIS — C44319 Basal cell carcinoma of skin of other parts of face: Secondary | ICD-10-CM | POA: Diagnosis not present

## 2017-04-18 DIAGNOSIS — L578 Other skin changes due to chronic exposure to nonionizing radiation: Secondary | ICD-10-CM | POA: Diagnosis not present

## 2017-04-18 DIAGNOSIS — L821 Other seborrheic keratosis: Secondary | ICD-10-CM | POA: Diagnosis not present

## 2017-04-23 DIAGNOSIS — Z6827 Body mass index (BMI) 27.0-27.9, adult: Secondary | ICD-10-CM | POA: Diagnosis not present

## 2017-04-23 DIAGNOSIS — J069 Acute upper respiratory infection, unspecified: Secondary | ICD-10-CM | POA: Diagnosis not present

## 2017-04-25 DIAGNOSIS — C61 Malignant neoplasm of prostate: Secondary | ICD-10-CM | POA: Diagnosis not present

## 2017-05-01 ENCOUNTER — Ambulatory Visit (INDEPENDENT_AMBULATORY_CARE_PROVIDER_SITE_OTHER): Payer: Medicare Other | Admitting: Pulmonary Disease

## 2017-05-01 ENCOUNTER — Ambulatory Visit (INDEPENDENT_AMBULATORY_CARE_PROVIDER_SITE_OTHER)
Admission: RE | Admit: 2017-05-01 | Discharge: 2017-05-01 | Disposition: A | Discharge status: Home / Self Care | Payer: Medicare Other | Source: Ambulatory Visit | Attending: Pulmonary Disease | Admitting: Pulmonary Disease

## 2017-05-01 ENCOUNTER — Encounter: Payer: Self-pay | Admitting: Pulmonary Disease

## 2017-05-01 VITALS — BP 114/72 | HR 65 | Ht 73.0 in | Wt 200.6 lb

## 2017-05-01 DIAGNOSIS — J849 Interstitial pulmonary disease, unspecified: Secondary | ICD-10-CM

## 2017-05-01 DIAGNOSIS — J84114 Acute interstitial pneumonitis: Secondary | ICD-10-CM | POA: Diagnosis not present

## 2017-05-01 DIAGNOSIS — I5022 Chronic systolic (congestive) heart failure: Secondary | ICD-10-CM | POA: Diagnosis not present

## 2017-05-01 DIAGNOSIS — R0602 Shortness of breath: Secondary | ICD-10-CM | POA: Diagnosis not present

## 2017-05-01 DIAGNOSIS — R05 Cough: Secondary | ICD-10-CM | POA: Diagnosis not present

## 2017-05-01 NOTE — Assessment & Plan Note (Signed)
Appears stable, no evidence of overt heart failure, no cough on lisinopril

## 2017-05-01 NOTE — Progress Notes (Signed)
   Subjective:    Patient ID: Daniel Herrera, male    DOB: Jun 01, 1940, 77 y.o.   MRN: [id number]  HPI  77 yo never smoker for FU of interstitial pneumonia  Past medical history-includes  prostate CA,C diff colitis and SVTs that he reports stop with vagal maneuvers.  He presented in 2015 with dyspnea for a few months, PFTs were normal except for mildly decreased DLCO .  [facility] showed mild interstitial prominence. CT abdomen from 05/2015 showed some dependent changes in both lung bases he has done well and has not required oxygen.  He is just getting over a chest cold, was given Augmentin seems to be helping. Denies cough, medication review shows lisinopril and metoprolol   Significant tests/ events  CT chest without contrast 11/2013  showed interstitial prominence particularly apical with mild bronchiectasis, small patchy infiltrate was noted in the right lower lobe with a tiny effusion. ESR ,CCP neg PFTs 02/2014 - nml FEV1/ FVC & ratio, TLC 81%, DLCO 65%    Review of Systems Patient denies significant dyspnea,cough, hemoptysis,  chest pain, palpitations, pedal edema, orthopnea, paroxysmal nocturnal dyspnea, lightheadedness, nausea, vomiting, abdominal or  leg pains      Objective:   Physical Exam   Gen. Pleasant, well-nourished, in no distress ENT - no thrush, no post nasal drip Neck: No JVD, no thyromegaly, no carotid bruits Lungs: no use of accessory muscles, no dullness to percussion, rt basal rales , no rhonchi  Cardiovascular: Rhythm regular, heart sounds  normal, no murmurs or gallops, no peripheral edema Musculoskeletal: No deformities, no cyanosis or clubbing         Assessment & Plan:

## 2017-05-01 NOTE — Assessment & Plan Note (Signed)
Chest x-ray today. Scarring in  lungs seems to be stable

## 2017-05-01 NOTE — Patient Instructions (Signed)
Chest x-ray today. Scarring in your lungs seems to be stable

## 2017-05-02 DIAGNOSIS — R351 Nocturia: Secondary | ICD-10-CM | POA: Diagnosis not present

## 2017-05-02 DIAGNOSIS — C61 Malignant neoplasm of prostate: Secondary | ICD-10-CM | POA: Diagnosis not present

## 2017-05-04 ENCOUNTER — Other Ambulatory Visit: Payer: Self-pay | Admitting: Cardiovascular Disease

## 2017-05-07 NOTE — Telephone Encounter (Signed)
Rx(s) sent to pharmacy electronically.  

## 2017-05-28 DIAGNOSIS — Z1331 Encounter for screening for depression: Secondary | ICD-10-CM | POA: Diagnosis not present

## 2017-05-28 DIAGNOSIS — E785 Hyperlipidemia, unspecified: Secondary | ICD-10-CM | POA: Diagnosis not present

## 2017-05-28 DIAGNOSIS — Z1211 Encounter for screening for malignant neoplasm of colon: Secondary | ICD-10-CM | POA: Diagnosis not present

## 2017-05-28 DIAGNOSIS — Z136 Encounter for screening for cardiovascular disorders: Secondary | ICD-10-CM | POA: Diagnosis not present

## 2017-05-28 DIAGNOSIS — Z9181 History of falling: Secondary | ICD-10-CM | POA: Diagnosis not present

## 2017-05-28 DIAGNOSIS — Z125 Encounter for screening for malignant neoplasm of prostate: Secondary | ICD-10-CM | POA: Diagnosis not present

## 2017-05-28 DIAGNOSIS — Z0000 Encounter for general adult medical examination without abnormal findings: Secondary | ICD-10-CM | POA: Diagnosis not present

## 2017-06-06 ENCOUNTER — Encounter: Payer: Self-pay | Admitting: Gastroenterology

## 2017-06-29 DIAGNOSIS — H04123 Dry eye syndrome of bilateral lacrimal glands: Secondary | ICD-10-CM | POA: Diagnosis not present

## 2017-06-29 DIAGNOSIS — H52203 Unspecified astigmatism, bilateral: Secondary | ICD-10-CM | POA: Diagnosis not present

## 2017-06-29 DIAGNOSIS — Z961 Presence of intraocular lens: Secondary | ICD-10-CM | POA: Diagnosis not present

## 2017-06-29 DIAGNOSIS — H43813 Vitreous degeneration, bilateral: Secondary | ICD-10-CM | POA: Diagnosis not present

## 2017-07-04 DIAGNOSIS — J309 Allergic rhinitis, unspecified: Secondary | ICD-10-CM | POA: Diagnosis not present

## 2017-07-04 DIAGNOSIS — K589 Irritable bowel syndrome without diarrhea: Secondary | ICD-10-CM | POA: Diagnosis not present

## 2017-07-04 DIAGNOSIS — E039 Hypothyroidism, unspecified: Secondary | ICD-10-CM | POA: Diagnosis not present

## 2017-07-04 DIAGNOSIS — L089 Local infection of the skin and subcutaneous tissue, unspecified: Secondary | ICD-10-CM | POA: Diagnosis not present

## 2017-07-04 DIAGNOSIS — E119 Type 2 diabetes mellitus without complications: Secondary | ICD-10-CM | POA: Diagnosis not present

## 2017-07-04 DIAGNOSIS — I10 Essential (primary) hypertension: Secondary | ICD-10-CM | POA: Diagnosis not present

## 2017-07-04 DIAGNOSIS — I2510 Atherosclerotic heart disease of native coronary artery without angina pectoris: Secondary | ICD-10-CM | POA: Diagnosis not present

## 2017-07-04 DIAGNOSIS — N400 Enlarged prostate without lower urinary tract symptoms: Secondary | ICD-10-CM | POA: Diagnosis not present

## 2017-07-04 DIAGNOSIS — E785 Hyperlipidemia, unspecified: Secondary | ICD-10-CM | POA: Diagnosis not present

## 2017-07-04 DIAGNOSIS — M1990 Unspecified osteoarthritis, unspecified site: Secondary | ICD-10-CM | POA: Diagnosis not present

## 2017-07-17 ENCOUNTER — Ambulatory Visit: Payer: Medicare Other | Admitting: Gastroenterology

## 2017-07-27 DIAGNOSIS — J069 Acute upper respiratory infection, unspecified: Secondary | ICD-10-CM | POA: Diagnosis not present

## 2017-07-27 DIAGNOSIS — K1379 Other lesions of oral mucosa: Secondary | ICD-10-CM | POA: Diagnosis not present

## 2017-07-28 ENCOUNTER — Inpatient Hospital Stay (HOSPITAL_COMMUNITY): Payer: Medicare Other

## 2017-07-28 ENCOUNTER — Encounter (HOSPITAL_COMMUNITY): Payer: Self-pay | Admitting: *Deleted

## 2017-07-28 ENCOUNTER — Observation Stay (HOSPITAL_COMMUNITY): Payer: Medicare Other | Admitting: Certified Registered"

## 2017-07-28 ENCOUNTER — Encounter (HOSPITAL_COMMUNITY)
Admission: EM | Disposition: A | Discharge status: Home Health | Payer: Self-pay | Source: Home / Self Care | Attending: Otolaryngology

## 2017-07-28 ENCOUNTER — Inpatient Hospital Stay (HOSPITAL_COMMUNITY)
Admission: EM | Admit: 2017-07-28 | Discharge: 2017-08-03 | DRG: 916 | Disposition: A | Discharge status: Home Health | Payer: Medicare Other | Attending: Internal Medicine | Admitting: Internal Medicine

## 2017-07-28 ENCOUNTER — Other Ambulatory Visit: Payer: Self-pay

## 2017-07-28 DIAGNOSIS — Z885 Allergy status to narcotic agent status: Secondary | ICD-10-CM | POA: Diagnosis not present

## 2017-07-28 DIAGNOSIS — K219 Gastro-esophageal reflux disease without esophagitis: Secondary | ICD-10-CM | POA: Diagnosis present

## 2017-07-28 DIAGNOSIS — Z7951 Long term (current) use of inhaled steroids: Secondary | ICD-10-CM

## 2017-07-28 DIAGNOSIS — T783XXD Angioneurotic edema, subsequent encounter: Secondary | ICD-10-CM | POA: Diagnosis not present

## 2017-07-28 DIAGNOSIS — Z4659 Encounter for fitting and adjustment of other gastrointestinal appliance and device: Secondary | ICD-10-CM

## 2017-07-28 DIAGNOSIS — Z01818 Encounter for other preprocedural examination: Secondary | ICD-10-CM | POA: Diagnosis not present

## 2017-07-28 DIAGNOSIS — I252 Old myocardial infarction: Secondary | ICD-10-CM | POA: Diagnosis not present

## 2017-07-28 DIAGNOSIS — Z886 Allergy status to analgesic agent status: Secondary | ICD-10-CM | POA: Diagnosis not present

## 2017-07-28 DIAGNOSIS — Z79890 Hormone replacement therapy (postmenopausal): Secondary | ICD-10-CM

## 2017-07-28 DIAGNOSIS — R7989 Other specified abnormal findings of blood chemistry: Secondary | ICD-10-CM | POA: Diagnosis present

## 2017-07-28 DIAGNOSIS — R229 Localized swelling, mass and lump, unspecified: Secondary | ICD-10-CM | POA: Diagnosis not present

## 2017-07-28 DIAGNOSIS — I10 Essential (primary) hypertension: Secondary | ICD-10-CM | POA: Diagnosis not present

## 2017-07-28 DIAGNOSIS — N400 Enlarged prostate without lower urinary tract symptoms: Secondary | ICD-10-CM | POA: Diagnosis present

## 2017-07-28 DIAGNOSIS — J9690 Respiratory failure, unspecified, unspecified whether with hypoxia or hypercapnia: Secondary | ICD-10-CM | POA: Diagnosis not present

## 2017-07-28 DIAGNOSIS — Z9049 Acquired absence of other specified parts of digestive tract: Secondary | ICD-10-CM

## 2017-07-28 DIAGNOSIS — M1990 Unspecified osteoarthritis, unspecified site: Secondary | ICD-10-CM | POA: Diagnosis present

## 2017-07-28 DIAGNOSIS — C61 Malignant neoplasm of prostate: Secondary | ICD-10-CM | POA: Diagnosis not present

## 2017-07-28 DIAGNOSIS — Z8546 Personal history of malignant neoplasm of prostate: Secondary | ICD-10-CM | POA: Diagnosis not present

## 2017-07-28 DIAGNOSIS — Z888 Allergy status to other drugs, medicaments and biological substances status: Secondary | ICD-10-CM

## 2017-07-28 DIAGNOSIS — E785 Hyperlipidemia, unspecified: Secondary | ICD-10-CM | POA: Diagnosis present

## 2017-07-28 DIAGNOSIS — R0689 Other abnormalities of breathing: Secondary | ICD-10-CM

## 2017-07-28 DIAGNOSIS — Z4682 Encounter for fitting and adjustment of non-vascular catheter: Secondary | ICD-10-CM | POA: Diagnosis not present

## 2017-07-28 DIAGNOSIS — Z8042 Family history of malignant neoplasm of prostate: Secondary | ICD-10-CM | POA: Diagnosis not present

## 2017-07-28 DIAGNOSIS — X58XXXA Exposure to other specified factors, initial encounter: Secondary | ICD-10-CM | POA: Diagnosis present

## 2017-07-28 DIAGNOSIS — M109 Gout, unspecified: Secondary | ICD-10-CM | POA: Diagnosis present

## 2017-07-28 DIAGNOSIS — E119 Type 2 diabetes mellitus without complications: Secondary | ICD-10-CM | POA: Diagnosis not present

## 2017-07-28 DIAGNOSIS — Z85828 Personal history of other malignant neoplasm of skin: Secondary | ICD-10-CM

## 2017-07-28 DIAGNOSIS — J384 Edema of larynx: Secondary | ICD-10-CM | POA: Diagnosis present

## 2017-07-28 DIAGNOSIS — T464X5A Adverse effect of angiotensin-converting-enzyme inhibitors, initial encounter: Secondary | ICD-10-CM | POA: Diagnosis present

## 2017-07-28 DIAGNOSIS — D841 Defects in the complement system: Secondary | ICD-10-CM | POA: Diagnosis present

## 2017-07-28 DIAGNOSIS — Z7902 Long term (current) use of antithrombotics/antiplatelets: Secondary | ICD-10-CM

## 2017-07-28 DIAGNOSIS — Z8711 Personal history of peptic ulcer disease: Secondary | ICD-10-CM | POA: Diagnosis not present

## 2017-07-28 DIAGNOSIS — R0603 Acute respiratory distress: Secondary | ICD-10-CM

## 2017-07-28 DIAGNOSIS — I110 Hypertensive heart disease with heart failure: Secondary | ICD-10-CM | POA: Diagnosis not present

## 2017-07-28 DIAGNOSIS — Z9861 Coronary angioplasty status: Secondary | ICD-10-CM

## 2017-07-28 DIAGNOSIS — J392 Other diseases of pharynx: Secondary | ICD-10-CM | POA: Diagnosis present

## 2017-07-28 DIAGNOSIS — T783XXA Angioneurotic edema, initial encounter: Secondary | ICD-10-CM | POA: Diagnosis not present

## 2017-07-28 DIAGNOSIS — E039 Hypothyroidism, unspecified: Secondary | ICD-10-CM | POA: Diagnosis present

## 2017-07-28 DIAGNOSIS — I2510 Atherosclerotic heart disease of native coronary artery without angina pectoris: Secondary | ICD-10-CM | POA: Diagnosis not present

## 2017-07-28 DIAGNOSIS — Z79899 Other long term (current) drug therapy: Secondary | ICD-10-CM

## 2017-07-28 DIAGNOSIS — I5022 Chronic systolic (congestive) heart failure: Secondary | ICD-10-CM | POA: Diagnosis not present

## 2017-07-28 DIAGNOSIS — Z430 Encounter for attention to tracheostomy: Secondary | ICD-10-CM | POA: Diagnosis not present

## 2017-07-28 HISTORY — DX: Defects in the complement system: D84.1

## 2017-07-28 LAB — CBC
HCT: 48.1 % (ref 39.0–52.0)
HEMATOCRIT: 39.1 % (ref 39.0–52.0)
HEMOGLOBIN: 16.3 g/dL (ref 13.0–17.0)
HEMOGLOBIN: 13.3 g/dL (ref 13.0–17.0)
MCH: 32.7 pg (ref 26.0–34.0)
MCH: 32.7 pg (ref 26.0–34.0)
MCHC: 33.9 g/dL (ref 30.0–36.0)
MCHC: 34 g/dL (ref 30.0–36.0)
MCV: 96.4 fL (ref 78.0–100.0)
MCV: 96.1 fL (ref 78.0–100.0)
Platelets: 168 10*3/uL (ref 150–400)
Platelets: 124 10*3/uL — ABNORMAL LOW (ref 150–400)
RBC: 4.99 MIL/uL (ref 4.22–5.81)
RBC: 4.07 MIL/uL — AB (ref 4.22–5.81)
RDW: 13 % (ref 11.5–15.5)
RDW: 12.8 % (ref 11.5–15.5)
WBC: 8.3 10*3/uL (ref 4.0–10.5)
WBC: 6.5 10*3/uL (ref 4.0–10.5)

## 2017-07-28 LAB — URINALYSIS, ROUTINE W REFLEX MICROSCOPIC
Bilirubin Urine: NEGATIVE
Glucose, UA: NEGATIVE mg/dL
Hgb urine dipstick: NEGATIVE
Ketones, ur: 5 mg/dL — AB
Leukocytes, UA: NEGATIVE
Nitrite: NEGATIVE
Protein, ur: NEGATIVE mg/dL
Specific Gravity, Urine: 1.013 (ref 1.005–1.030)
pH: 5 (ref 5.0–8.0)

## 2017-07-28 LAB — MRSA PCR SCREENING: MRSA by PCR: NEGATIVE

## 2017-07-28 LAB — POCT I-STAT 3, ART BLOOD GAS (G3+)
Acid-base deficit: 3 mmol/L — ABNORMAL HIGH (ref 0.0–2.0)
Bicarbonate: 19.3 mmol/L — ABNORMAL LOW (ref 20.0–28.0)
O2 Saturation: 98 %
Patient temperature: 97.9
TCO2: 20 mmol/L — ABNORMAL LOW (ref 22–32)
pCO2 arterial: 25.8 mmHg — ABNORMAL LOW (ref 32.0–48.0)
pH, Arterial: 7.481 — ABNORMAL HIGH (ref 7.350–7.450)
pO2, Arterial: 98 mmHg (ref 83.0–108.0)

## 2017-07-28 LAB — CREATININE, SERUM
CREATININE: 1.01 mg/dL (ref 0.61–1.24)
GFR calc Af Amer: >60 mL/min (ref >60)
GFR calc non Af Amer: >60 mL/min (ref >60)

## 2017-07-28 LAB — BASIC METABOLIC PANEL
Anion gap: 10 (ref 5–15)
BUN: 19 mg/dL (ref 6–20)
CHLORIDE: 103 mmol/L (ref 101–111)
CO2: 25 mmol/L (ref 22–32)
CREATININE: 1.36 mg/dL — AB (ref 0.61–1.24)
Calcium: 9.1 mg/dL (ref 8.9–10.3)
GFR, EST AFRICAN AMERICAN: 55 mL/min — AB (ref >60)
GFR, EST NON AFRICAN AMERICAN: 48 mL/min — AB (ref >60)
Glucose, Bld: 121 mg/dL — ABNORMAL HIGH (ref 65–99)
Potassium: 3.9 mmol/L (ref 3.5–5.1)
SODIUM: 138 mmol/L (ref 135–145)

## 2017-07-28 LAB — GLUCOSE, CAPILLARY: Glucose-Capillary: 189 mg/dL — ABNORMAL HIGH (ref 65–99)

## 2017-07-28 LAB — TRIGLYCERIDES: Triglycerides: 60 mg/dL (ref <150)

## 2017-07-28 SURGERY — EXAM UNDER ANESTHESIA
Anesthesia: General

## 2017-07-28 MED ORDER — LEVOTHYROXINE SODIUM 100 MCG IV SOLR
75.0000 ug | Freq: Every day | INTRAVENOUS | Status: DC
  Administered 2017-07-28: 75 ug via INTRAVENOUS
  Filled 2017-07-28 (×2): qty 5

## 2017-07-28 MED ORDER — 0.9 % SODIUM CHLORIDE (POUR BTL) OPTIME
TOPICAL | Status: DC | PRN
  Administered 2017-07-28: 1000 mL

## 2017-07-28 MED ORDER — MIDAZOLAM HCL 2 MG/2ML IJ SOLN
INTRAMUSCULAR | Status: AC
  Filled 2017-07-28: qty 2

## 2017-07-28 MED ORDER — ONDANSETRON HCL 4 MG/2ML IJ SOLN: 4.0000 mg | Freq: Four times a day (QID) | INTRAMUSCULAR | Status: DC | PRN

## 2017-07-28 MED ORDER — ROCURONIUM BROMIDE 10 MG/ML (PF) SYRINGE
PREFILLED_SYRINGE | INTRAVENOUS | Status: AC
  Filled 2017-07-28: qty 5

## 2017-07-28 MED ORDER — PROPOFOL 10 MG/ML IV BOLUS
INTRAVENOUS | Status: AC
  Filled 2017-07-28: qty 20

## 2017-07-28 MED ORDER — RACEPINEPHRINE HCL 2.25 % IN NEBU
0.5000 mL | INHALATION_SOLUTION | RESPIRATORY_TRACT | Status: DC | PRN
  Filled 2017-07-28: qty 0.5

## 2017-07-28 MED ORDER — FAMOTIDINE IN NACL 20-0.9 MG/50ML-% IV SOLN
20.0000 mg | Freq: Two times a day (BID) | INTRAVENOUS | Status: DC
  Administered 2017-07-28 – 2017-08-01 (×8): 20 mg via INTRAVENOUS
  Filled 2017-07-28 (×8): qty 50

## 2017-07-28 MED ORDER — AMLODIPINE BESYLATE 5 MG PO TABS
5.0000 mg | ORAL_TABLET | Freq: Every day | ORAL | Status: DC
  Administered 2017-07-29 – 2017-08-03 (×6): 5 mg via ORAL
  Filled 2017-07-28 (×7): qty 1

## 2017-07-28 MED ORDER — LIDOCAINE HCL 4 % EX SOLN
Freq: Once | CUTANEOUS | Status: DC
  Filled 2017-07-28: qty 50

## 2017-07-28 MED ORDER — CHLORHEXIDINE GLUCONATE 0.12% ORAL RINSE (MEDLINE KIT): 15.0000 mL | Freq: Two times a day (BID) | OROMUCOSAL | Status: DC

## 2017-07-28 MED ORDER — FLUTICASONE PROPIONATE 50 MCG/ACT NA SUSP
1.0000 | Freq: Every day | NASAL | Status: DC
  Administered 2017-07-29 – 2017-08-03 (×5): 1 via NASAL
  Filled 2017-07-28: qty 16

## 2017-07-28 MED ORDER — CHLORHEXIDINE GLUCONATE 0.12% ORAL RINSE (MEDLINE KIT)
15.0000 mL | Freq: Two times a day (BID) | OROMUCOSAL | Status: DC
  Administered 2017-07-28 – 2017-08-03 (×7): 15 mL via OROMUCOSAL

## 2017-07-28 MED ORDER — ROCURONIUM BROMIDE 10 MG/ML (PF) SYRINGE
PREFILLED_SYRINGE | INTRAVENOUS | Status: DC | PRN
  Administered 2017-07-28: 50 mg via INTRAVENOUS

## 2017-07-28 MED ORDER — ENOXAPARIN SODIUM 40 MG/0.4ML ~~LOC~~ SOLN
40.0000 mg | SUBCUTANEOUS | Status: DC
  Filled 2017-07-28: qty 0.4

## 2017-07-28 MED ORDER — PROPOFOL 500 MG/50ML IV EMUL
INTRAVENOUS | Status: DC | PRN
  Administered 2017-07-28: 50 ug/kg/min via INTRAVENOUS

## 2017-07-28 MED ORDER — DEXAMETHASONE SODIUM PHOSPHATE 10 MG/ML IJ SOLN
10.0000 mg | Freq: Once | INTRAMUSCULAR | Status: AC
  Administered 2017-07-28: 10 mg via INTRAVENOUS
  Filled 2017-07-28: qty 1

## 2017-07-28 MED ORDER — ENOXAPARIN SODIUM 40 MG/0.4ML ~~LOC~~ SOLN
40.0000 mg | SUBCUTANEOUS | Status: DC
  Administered 2017-07-28 – 2017-08-02 (×6): 40 mg via SUBCUTANEOUS
  Filled 2017-07-28 (×7): qty 0.4

## 2017-07-28 MED ORDER — PROPOFOL 1000 MG/100ML IV EMUL
5.0000 ug/kg/min | INTRAVENOUS | Status: DC
  Administered 2017-07-28 – 2017-07-29 (×7): 50 ug/kg/min via INTRAVENOUS
  Administered 2017-07-29: 39.933 ug/kg/min via INTRAVENOUS
  Administered 2017-07-30: 36 ug/kg/min via INTRAVENOUS
  Administered 2017-07-30: 39.933 ug/kg/min via INTRAVENOUS
  Filled 2017-07-28 (×10): qty 100

## 2017-07-28 MED ORDER — DEXAMETHASONE SODIUM PHOSPHATE 10 MG/ML IJ SOLN: 10.0000 mg | Freq: Four times a day (QID) | INTRAMUSCULAR | Status: DC

## 2017-07-28 MED ORDER — DEXAMETHASONE SODIUM PHOSPHATE 10 MG/ML IJ SOLN
10.0000 mg | Freq: Four times a day (QID) | INTRAMUSCULAR | Status: DC
  Administered 2017-07-28 (×2): 10 mg via INTRAVENOUS
  Filled 2017-07-28 (×2): qty 1

## 2017-07-28 MED ORDER — LEVOTHYROXINE SODIUM 75 MCG PO TABS: 75.0000 ug | ORAL_TABLET | Freq: Every day | ORAL | Status: DC

## 2017-07-28 MED ORDER — DEXAMETHASONE SODIUM PHOSPHATE 10 MG/ML IJ SOLN
10.0000 mg | Freq: Four times a day (QID) | INTRAMUSCULAR | Status: AC
  Administered 2017-07-28: 10 mg via INTRAVENOUS
  Filled 2017-07-28: qty 1

## 2017-07-28 MED ORDER — LABETALOL HCL 5 MG/ML IV SOLN: 5.0000 mg | INTRAVENOUS | Status: DC | PRN

## 2017-07-28 MED ORDER — CLOPIDOGREL BISULFATE 75 MG PO TABS
75.0000 mg | ORAL_TABLET | Freq: Every day | ORAL | Status: DC
  Filled 2017-07-28: qty 1

## 2017-07-28 MED ORDER — METOPROLOL TARTRATE 5 MG/5ML IV SOLN
5.0000 mg | Freq: Four times a day (QID) | INTRAVENOUS | Status: DC
  Administered 2017-07-28 – 2017-08-02 (×6): 5 mg via INTRAVENOUS
  Filled 2017-07-28 (×7): qty 5

## 2017-07-28 MED ORDER — METOPROLOL TARTRATE 25 MG PO TABS: 25.0000 mg | ORAL_TABLET | Freq: Two times a day (BID) | ORAL | Status: DC

## 2017-07-28 MED ORDER — FAMOTIDINE IN NACL 20-0.9 MG/50ML-% IV SOLN
20.0000 mg | Freq: Once | INTRAVENOUS | Status: AC
Start: 2017-07-28 — End: 2017-07-28
  Administered 2017-07-28: 20 mg via INTRAVENOUS
  Filled 2017-07-28: qty 50

## 2017-07-28 MED ORDER — LIDOCAINE-EPINEPHRINE 1 %-1:100000 IJ SOLN
INTRAMUSCULAR | Status: AC
  Filled 2017-07-28: qty 1

## 2017-07-28 MED ORDER — SODIUM CHLORIDE 0.9 % IV SOLN
INTRAVENOUS | Status: DC
  Administered 2017-07-28: 16:00:00 via INTRAVENOUS

## 2017-07-28 MED ORDER — FENTANYL CITRATE (PF) 250 MCG/5ML IJ SOLN
INTRAMUSCULAR | Status: AC
  Filled 2017-07-28: qty 5

## 2017-07-28 MED ORDER — RACEPINEPHRINE HCL 2.25 % IN NEBU
0.5000 mL | INHALATION_SOLUTION | Freq: Once | RESPIRATORY_TRACT | Status: AC
  Administered 2017-07-28: 0.5 mL via RESPIRATORY_TRACT
  Filled 2017-07-28: qty 0.5

## 2017-07-28 MED ORDER — SODIUM CHLORIDE 0.9 % IV SOLN: 250.0000 mL | INTRAVENOUS | Status: DC | PRN

## 2017-07-28 MED ORDER — ORAL CARE MOUTH RINSE
15.0000 mL | OROMUCOSAL | Status: DC
  Administered 2017-07-28 – 2017-07-30 (×19): 15 mL via OROMUCOSAL

## 2017-07-28 MED ORDER — FAMOTIDINE IN NACL 20-0.9 MG/50ML-% IV SOLN
20.0000 mg | Freq: Two times a day (BID) | INTRAVENOUS | Status: DC
  Administered 2017-07-28: 20 mg via INTRAVENOUS

## 2017-07-28 MED ORDER — CLOPIDOGREL BISULFATE 75 MG PO TABS
75.0000 mg | ORAL_TABLET | Freq: Every day | ORAL | Status: DC
  Administered 2017-07-28 – 2017-08-03 (×7): 75 mg
  Filled 2017-07-28 (×6): qty 1

## 2017-07-28 MED ORDER — ORAL CARE MOUTH RINSE: 15.0000 mL | Freq: Four times a day (QID) | OROMUCOSAL | Status: DC

## 2017-07-28 SURGICAL SUPPLY — 38 items
BLADE CLIPPER SURG (BLADE) IMPLANT
BLADE SURG 15 STRL LF DISP TIS (BLADE) IMPLANT
BLADE SURG 15 STRL SS (BLADE)
CANISTER SUCT 3000ML PPV (MISCELLANEOUS) ×2 IMPLANT
CLEANER TIP ELECTROSURG 2X2 (MISCELLANEOUS) ×2 IMPLANT
COVER SURGICAL LIGHT HANDLE (MISCELLANEOUS) IMPLANT
CRADLE DONUT ADULT HEAD (MISCELLANEOUS) IMPLANT
DECANTER SPIKE VIAL GLASS SM (MISCELLANEOUS) ×2 IMPLANT
DRAPE HALF SHEET 40X57 (DRAPES) ×2 IMPLANT
ELECT COATED BLADE 2.86 ST (ELECTRODE) IMPLANT
ELECT REM PT RETURN 9FT ADLT (ELECTROSURGICAL)
ELECTRODE REM PT RTRN 9FT ADLT (ELECTROSURGICAL) IMPLANT
GAUZE PETROLATUM 1 X8 (GAUZE/BANDAGES/DRESSINGS) IMPLANT
GAUZE SPONGE 4X4 16PLY XRAY LF (GAUZE/BANDAGES/DRESSINGS) IMPLANT
GLOVE BIO SURGEON STRL SZ 6.5 (GLOVE) IMPLANT
GOWN STRL REUS W/ TWL LRG LVL3 (GOWN DISPOSABLE) ×1 IMPLANT
GOWN STRL REUS W/TWL LRG LVL3 (GOWN DISPOSABLE) ×1
HOLDER TRACH TUBE VELCRO 19.5 (MISCELLANEOUS) IMPLANT
KIT BASIN OR (CUSTOM PROCEDURE TRAY) IMPLANT
KIT SUCTION CATH 14FR (SUCTIONS) IMPLANT
KIT TURNOVER KIT B (KITS) ×2 IMPLANT
NEEDLE HYPO 25GX1X1/2 BEV (NEEDLE) ×2 IMPLANT
NS IRRIG 1000ML POUR BTL (IV SOLUTION) ×2 IMPLANT
PACK GENERAL/GYN (CUSTOM PROCEDURE TRAY) ×2 IMPLANT
PAD ARMBOARD 7.5X6 YLW CONV (MISCELLANEOUS) ×2 IMPLANT
PENCIL BUTTON HOLSTER BLD 10FT (ELECTRODE) IMPLANT
SPONGE DRAIN TRACH 4X4 STRL 2S (GAUZE/BANDAGES/DRESSINGS) IMPLANT
SPONGE INTESTINAL PEANUT (DISPOSABLE) IMPLANT
SUT SILK 0 FSL (SUTURE) IMPLANT
SUT SILK 2 0 REEL (SUTURE) IMPLANT
SUT SILK 2 0 SH CR/8 (SUTURE) IMPLANT
SUT VIC AB 2-0 FS1 27 (SUTURE) IMPLANT
SYR 20ML ECCENTRIC (SYRINGE) IMPLANT
SYR BULB 3OZ (MISCELLANEOUS) ×2 IMPLANT
SYR CONTROL 10ML LL (SYRINGE) ×2 IMPLANT
TRAY ENT MC OR (CUSTOM PROCEDURE TRAY) IMPLANT
TUBE CONNECTING 12X1/4 (SUCTIONS) ×4 IMPLANT
WATER STERILE IRR 1000ML POUR (IV SOLUTION) ×2 IMPLANT

## 2017-07-28 NOTE — Anesthesia Preprocedure Evaluation (Signed)
Anesthesia Evaluation  Patient identified by MRN, date of birth, ID band Patient awake    Reviewed: Allergy & Precautions, NPO status , Patient's Chart, lab work & pertinent test results  History of Anesthesia Complications Negative for: history of anesthetic complications  Airway Mallampati: IV  TM Distance: >3 FB Neck ROM: Limited    Dental  (+) Teeth Intact   Pulmonary shortness of breath,  Angioedema    breath sounds clear to auscultation       Cardiovascular hypertension, Pt. on medications and Pt. on home beta blockers + CAD, + Past MI, + Cardiac Stents and +CHF   Rhythm:Regular     Neuro/Psych PSYCHIATRIC DISORDERS Anxiety negative neurological ROS     GI/Hepatic GERD  ,  Endo/Other  diabetesHypothyroidism   Renal/GU      Musculoskeletal  (+) Arthritis ,   Abdominal   Peds  Hematology   Anesthesia Other Findings Lip edema and hoarse voice,   Reproductive/Obstetrics                             Anesthesia Physical Anesthesia Plan  ASA: III  Anesthesia Plan: General   Post-op Pain Management:    Induction:   PONV Risk Score and Plan: 2 and Treatment may vary due to age or medical condition  Airway Management Planned: Fiberoptic Intubation Planned and Awake Intubation Planned  Additional Equipment:   Intra-op Plan:   Post-operative Plan: Post-operative intubation/ventilation  Informed Consent: I have reviewed the patients History and Physical, chart, labs and discussed the procedure including the risks, benefits and alternatives for the proposed anesthesia with the patient or authorized representative who has indicated his/her understanding and acceptance.   Dental advisory given  Plan Discussed with: CRNA and Surgeon  Anesthesia Plan Comments:         Anesthesia Quick Evaluation

## 2017-07-28 NOTE — Progress Notes (Signed)
Paged CCM  Pearline Cables, MD to let him know that his patient is now back from being intubated in the OR and that I needed orders. Dr. Pearline Cables en route to the bedside. Verbal order for ABG in 1 hr. Condom cath placed. OG placed to catch patient up on his medications from ED. Chest xray placed to verify placement of ET tube and OG tube. Will continue to monitor closely.  Lucius Conn, RN

## 2017-07-28 NOTE — Anesthesia Procedure Notes (Signed)
Procedure Name: Awake intubation Date/Time: 07/28/2017 2:34 PM Performed by: Oleta Mouse, MD Pre-anesthesia Checklist: Patient identified, Emergency Drugs available, Suction available, Patient being monitored and Timeout performed Patient Re-evaluated:Patient Re-evaluated prior to induction Preoxygenation: Pre-oxygenation with 100% oxygen Tube size: 7.5 mm Number of attempts: 1 Airway Equipment and Method: Fiberoptic brochoscope Placement Confirmation: ETT inserted through vocal cords under direct vision,  positive ETCO2 and breath sounds checked- equal and bilateral Secured at: 23 cm Tube secured with: Tape Dental Injury: Teeth and Oropharynx as per pre-operative assessment  Comments: Pretreated with 4% lido x 16ml nebulized

## 2017-07-28 NOTE — Progress Notes (Addendum)
In formed Daniel Cables, MD about patients abg results. MD verbal order to change the rate on the vent to 14 breaths per minute. Will change and monitor closely. Respiratory therapy made aware.  Lucius Conn, RN

## 2017-07-28 NOTE — Procedures (Signed)
Procedure: Transnasal Flexible Laryngoscopy  Preoperative Diagnosis: angioedema Postoperative Diagnosis: same Procedure: Transnasal fiberoptic laryngoscopy.  Estimated Blood Loss: 0 mL.  Complications: None.  Findings: The nasal cavity and nasopharynx are unremarkable. There are no suspicious findings in the nasopharynx or Fossa of [place].  The soft palate and lingual surface of epiglottis demonstrate improved edema. The left AE fold has a very mildly worsening angioedema, but the airway remains widely patent and the cords readily visible. There is no pooling of secretions or aspiration.  Description of Procedure: With the patient in the sitting position, topical  Afrin-lidocaine mixture in an atomizer was applied to the nose. The scope was passed through the nose. Examination was carried out of the nose, nasopharynx, oropharynx, hypopharynx, and larynx with findings as noted above. Scope was removed.  The patient tolerated the procedure well.

## 2017-07-28 NOTE — Consult Note (Signed)
PULMONARY / CRITICAL CARE MEDICINE   Name: Daniel Herrera MRN: [id number] DOB: 12-16-1940    ADMISSION DATE:  07/28/2017 CONSULTATION DATE: 07/28/2017  REFERRING MD: ER  CHIEF COMPLAINT: Dyspnea  HISTORY OF PRESENT ILLNESS:        This is a 77 year old with a history of coronary disease and prostate cancer who presents complaining of dyspnea.  He tells me that he began having some cough for several days followed but by what felt like ulcers in his mouth.  He was seen by primary care physician yesterday who prescribed cough syrup however he had increasing mouth swelling and decided not to take it.  He presented this morning with an increasing sense of swelling in his mouth and change in his vocalization.  He has not had any difficulties in swallowing his own secretions.       He has been examined by ENT feels that there is some boggy angioedema of the soft palate, the left area retinal fold the lingual surface of the epiglottis.  Should be noted that Daniel Herrera has been taking lisinopril for a very long time.  He has had no change in his dose and he has had no new medicines introduced.  He did not have any associated urticaria or wheezing.  There is no family history of angioedema.  PAST MEDICAL HISTORY :  He  has a past medical history of Anxiety, Arthritis, CAD (coronary artery disease), GERD (gastroesophageal reflux disease), Gout, High cholesterol, History of prolonged Q-T interval on ECG, Clostridium difficile infection, umbilical hernia repair, Personal history of digestive disease, Prostate cancer ([place]), Prostate enlargement, Skin cancer, and Thyroid disease.  PAST SURGICAL HISTORY: He  has a past surgical history that includes Back surgery; Nasal sinus surgery; Appendectomy; Prostate biopsy; Cataract extraction, bilateral; right shoulder rotator cuff repair; and Cardiac catheterization (N/A, 02/21/2015).  Allergies  Allergen Reactions  . Lisinopril Swelling    Facial and lip  swelling. And throat swelling.   . Benadryl [Diphenhydramine Hcl] Swelling    Facial swelling from high dose (tolerates Advil PM as needed)  . Darvocet [Propoxyphene N-Acetaminophen] Other (See Comments)    hallucination  . Darvon [Propoxyphene Hcl] Other (See Comments)    Hallucination   . Tylenol [Acetaminophen] Swelling    Foot swelling    No current facility-administered medications on file prior to encounter.    Current Outpatient Medications on File Prior to Encounter  Medication Sig  . clobetasol ointment (TEMOVATE) 9.74 % Apply 1 application topically daily as needed (cellulitis on legs).   . clopidogrel (PLAVIX) 75 MG tablet TAKE 1 TABLET(75 MG) BY MOUTH DAILY  . famotidine (PEPCID) 20 MG tablet Take 1 tablet (20 mg total) by mouth 2 (two) times daily as needed for heartburn or indigestion.  [name] levothyroxine (SYNTHROID, LEVOTHROID) 75 MCG tablet Take 75 mcg by mouth daily before breakfast.  . lisinopril (PRINIVIL,ZESTRIL) 10 MG tablet Take 10 mg by mouth daily.  . metoprolol tartrate (LOPRESSOR) 25 MG tablet Take 1 tablet (25 mg total) by mouth 2 (two) times daily.  . nabumetone (RELAFEN) 750 MG tablet Take 1 tablet by mouth daily.   . pantoprazole (PROTONIX) 40 MG tablet Take 1 tablet (40 mg total) by mouth daily.  . Pitavastatin Calcium (LIVALO) 4 MG TABS Take 4 mg by mouth at bedtime.   Vladimir Faster Glycol-Propyl Glycol (SYSTANE OP) Place 1 drop into both eyes 2 (two) times daily.  [name] saccharomyces boulardii (FLORASTOR) 250 MG capsule Take 1 capsule (250 mg total)  by mouth 2 (two) times daily.  . fluticasone (FLONASE) 50 MCG/ACT nasal spray Place 1 spray into both nostrils daily.  . nitroGLYCERIN (NITROSTAT) 0.4 MG SL tablet DISSOLVE 1 TABLET UNDER TONGUE EVERY 5 MINUTES FOR 3 DOSES AS NEEDED CHEST PAIN. IF NO RELIEF CALL 911  . nystatin (MYCOSTATIN) 100000 UNIT/ML suspension Take 2.5 mLs by mouth at bedtime as needed (thrush). Swish and spit    FAMILY HISTORY:  His  indicated that his mother is deceased. He indicated that his father is deceased. He indicated that the status of his sister is unknown. He indicated that his maternal grandmother is deceased. He indicated that his maternal grandfather is deceased. He indicated that his paternal grandmother is deceased. He indicated that his paternal grandfather is deceased.   SOCIAL HISTORY: He  reports that he has never smoked. He has never used smokeless tobacco. He reports that he does not drink alcohol or use drugs.  REVIEW OF SYSTEMS:   He is generally sedentary.  He has no history of CNS disease.  He does have a history of mild COPD, and a "spot" on his lung which is been stable for many years.  He has a history of coronary artery disease and under went stent to the mid LAD on 02/21/2015.  He tells me he has had exertional chest pain which is been stable since that time.  So has a history of paroxysmal.  He is concerned that he has suprapubic discomfort and it should be noted that he has had radiation to his prostate in the past and a prior ventral hernia repair.  Is currently not having difficulties with dysuria and he has no difficulty with urinary continence but tells me that he frequently has rectal incontinence when he micturates.  He has been diagnosed with C. difficile in the past but is never had any protracted course of antibiotics.  SUBJECTIVE:  As above  VITAL SIGNS: BP (!) 113/91   Pulse 75   Temp 98.2 F (36.8 C) (Oral)   Resp 13   Ht 6\' 1"  (1.854 m)   Wt 193 lb (87.5 kg)   SpO2 99%   BMI 25.46 kg/m   HEMODYNAMICS:    VENTILATOR SETTINGS:    INTAKE / OUTPUT: No intake/output data recorded.  PHYSICAL EXAMINATION: General: He is breathing comfortably on room air but has an obviously raspy voice. Neuro: He is alert and appropriate x3 and moving all limbs. HEENT: I do not appreciate any erythema of the upper airway and the visualized portion of the airway is class  I. Cardiovascular: S1 and S2 are regular without murmur rub or gallop Lungs: Respirations are unlabored, there is no JVD.  There is symmetric air movement, no wheezes. Abdomen: The abdomen is soft without any organomegaly masses or tenderness specifically I do not detect any suprapubic tenderness or mass.   Musculoskeletal: No edema   LABS:  BMET Recent Labs  Lab 07/28/17 0720  NA 138  K 3.9  CL 103  CO2 25  BUN 19  CREATININE 1.36*  GLUCOSE 121*    Electrolytes Recent Labs  Lab 07/28/17 0720  CALCIUM 9.1    CBC Recent Labs  Lab 07/28/17 0720  WBC 8.3  HGB 16.3  HCT 48.1  PLT 168    Coag's No results for input(s): APTT, INR in the last 168 hours.  Sepsis Markers No results for input(s): LATICACIDVEN, PROCALCITON, O2SATVEN in the last 168 hours.  ABG No results for input(s): PHART, PCO2ART, PO2ART  in the last 168 hours.  Liver Enzymes No results for input(s): AST, ALT, ALKPHOS, BILITOT, ALBUMIN in the last 168 hours.  Cardiac Enzymes No results for input(s): TROPONINI, PROBNP in the last 168 hours.  Glucose No results for input(s): GLUCAP in the last 168 hours.  Imaging No results found.   DISCUSSION:     This is a 77 year old who presents with dyspnea and finding of angioedema on examination of the upper airway.  He has been on lisinopril for many years and this is likely the provoking agent.  Of ordered a modest dose of her vascular replacement agent, and ordered as needed C make epinephrine, digital Pepcid and scheduled Decadron.  Should he develop any difficulties with swallowing or controlling his secretions or difficulties with dyspnea we will need to reevaluate his airway and be placing him in ICU for observation overnight.  In addition the patient has complaints of suprapubic discomfort, with a history of ventral hernia repair and radiation of the prostate.  Urinalysis and stool guaiac have been ordered as first steps in evaluation.  I am most  concerned that he has radiation proctitis   Lars Masson, MD [place] Pager: [phone]  07/28/2017, 12:33 PM

## 2017-07-28 NOTE — ED Provider Notes (Signed)
[facility] Provider Note   CSN: [id number] Arrival date & time: 07/28/17  0708     History   Chief Complaint Chief Complaint  Patient presents with  . Angioedema    HPI Daniel Herrera is a 77 y.o. male.  HPI   Presents with concern for lip swelling and sensation of throat swelling Lip swelling began yesterday Has been on lisinopril for 2-3 years No new medications, no new over the counter medications or supplements, no new foods Reports swelling began on portion of lip initially, then continued to worse, tried ice and didn't work Administrator, [employer] had mouth and lip swelling but this morning felt like having throat swelling. Now feels tongue or throat swelling Yesterday went to doctor for cough but did not fill rx Reports difficulty swallowing No shortness of breath Has had abdominal pain ond off for long time, lower abdominal pain. Hx of chronic problems. Had diarrhea Tuesday, gets it frequently. Abdominal pain is currently sharp lower abdomen present for several days. No fevers or continuing diarrhea. No vomiting.  Past Medical History:  Diagnosis Date  . Anxiety   . Arthritis   . CAD (coronary artery disease)    a. 02/2015: DES to mid-LAD  . GERD (gastroesophageal reflux disease)   . Gout   . High cholesterol   . History of prolonged Q-T interval on ECG   . Hx of Clostridium difficile infection   . Hx of umbilical hernia repair   . Personal history of digestive disease    gastric ulcer  . Prostate cancer ([place])   . Prostate enlargement   . Skin cancer    basal cell carcinoma  . Thyroid disease     Patient Active Problem List   Diagnosis Date Noted  . Angioedema 07/28/2017  . Enterotoxigenic Escherichia coli infection 05/24/2015  . STEC (Shiga toxin-producing Escherichia coli) infection 05/24/2015  . Colitis 05/23/2015  . Prostate cancer ([place])   . Chronic diarrhea of unknown origin   . Chronic systolic CHF (congestive heart failure) ([place])    . CAD in native artery   . Type 2 diabetes mellitus with unspecified complications ([place])   . Coronary artery disease 04/08/2015  . Chest pain 04/08/2015  . Dyspnea 04/08/2015  . Palpitations 03/15/2015  . Non-STEMI (non-ST elevated myocardial infarction) ([place]) 02/22/2015  . NSTEMI (non-ST elevated myocardial infarction) ([place]) 02/21/2015  . Abnormal EKG 02/21/2015  . Essential hypertension 02/21/2015  . Hyperlipidemia 02/21/2015  . Malignant neoplasm of prostate ([place]) 08/07/2014  . Acute interstitial pneumonitis ([place]) 12/17/2013    Past Surgical History:  Procedure Laterality Date  . APPENDECTOMY    . BACK SURGERY    . CARDIAC CATHETERIZATION N/A 02/21/2015   Procedure: Left Heart Cath;  Surgeon: Lorretta Harp, MD;  Location: [facility];  Service: Cardiovascular;  Laterality: N/A;  . CATARACT EXTRACTION, BILATERAL    . NASAL SINUS SURGERY    . PROSTATE BIOPSY    . right shoulder rotator cuff repair          Home Medications    Prior to Admission medications   Medication Sig Start Date End Date Taking? Authorizing Provider  clobetasol ointment (TEMOVATE) 0.26 % Apply 1 application topically daily as needed (cellulitis on legs).  01/26/15  Yes [provider]  clopidogrel (PLAVIX) 75 MG tablet TAKE 1 TABLET(75 MG) BY MOUTH DAILY 05/07/17  Yes Lorretta Harp, MD  famotidine (PEPCID) 20 MG tablet Take 1 tablet (20 mg total) by  mouth 2 (two) times daily as needed for heartburn or indigestion. 05/25/15  Yes Short, Noah Delaine, MD  levothyroxine (SYNTHROID, LEVOTHROID) 75 MCG tablet Take 75 mcg by mouth daily before breakfast.   Yes [provider]  lisinopril (PRINIVIL,ZESTRIL) 10 MG tablet Take 10 mg by mouth daily.   Yes [provider]  metoprolol tartrate (LOPRESSOR) 25 MG tablet Take 1 tablet (25 mg total) by mouth 2 (two) times daily. 05/25/15  Yes Short, Noah Delaine, MD  nabumetone (RELAFEN) 750 MG tablet Take 1 tablet by mouth daily.   01/29/17  Yes [provider]  pantoprazole (PROTONIX) 40 MG tablet Take 1 tablet (40 mg total) by mouth daily. 03/09/16  Yes Lorretta Harp, MD  Pitavastatin Calcium (LIVALO) 4 MG TABS Take 4 mg by mouth at bedtime.    Yes [provider]  Polyethyl Glycol-Propyl Glycol (SYSTANE OP) Place 1 drop into both eyes 2 (two) times daily.   Yes [provider]  saccharomyces boulardii (FLORASTOR) 250 MG capsule Take 1 capsule (250 mg total) by mouth 2 (two) times daily. 05/25/15  Yes Short, Noah Delaine, MD  fluticasone (FLONASE) 50 MCG/ACT nasal spray Place 1 spray into both nostrils daily.    [provider]  nitroGLYCERIN (NITROSTAT) 0.4 MG SL tablet DISSOLVE 1 TABLET UNDER TONGUE EVERY 5 MINUTES FOR 3 DOSES AS NEEDED CHEST PAIN. IF NO RELIEF CALL 911 03/03/15   Lyda Jester M, PA-C  nystatin (MYCOSTATIN) 100000 UNIT/ML suspension Take 2.5 mLs by mouth at bedtime as needed (thrush). Swish and spit 05/10/15   [provider]    Family History Family History  Problem Relation Age of Onset  . Cancer Mother   . Cancer Father        prostate  . Cancer Sister     Social History Social History   Tobacco Use  . Smoking status: Never Smoker  . Smokeless tobacco: Never Used  Substance Use Topics  . Alcohol use: No  . Drug use: No     Allergies   Lisinopril; Benadryl [diphenhydramine hcl]; Darvocet [propoxyphene n-acetaminophen]; Darvon [propoxyphene hcl]; and Tylenol [acetaminophen]   Review of Systems Review of Systems  Constitutional: Negative for fever.  HENT: Positive for trouble swallowing. Negative for sore throat.   Eyes: Negative for visual disturbance.  Respiratory: Positive for cough (2-3 weeks). Negative for shortness of breath.   Cardiovascular: Negative for chest pain.  Gastrointestinal: Positive for diarrhea (tuesday afternoon and yesterday, has it often with chronic abdominal symptoms). Negative for abdominal pain (chronic no  new changes).  Genitourinary: Negative for difficulty urinating.  Musculoskeletal: Negative for back pain and neck stiffness.  Skin: Negative for rash.  Neurological: Negative for syncope and headaches.     Physical Exam Updated Vital Signs BP 109/71   Pulse 88   Temp 97.8 F (36.6 C) (Axillary)   Resp 14   Ht '6\' 1"'  (1.854 m)   Wt 88.9 kg (195 lb 15.8 oz)   SpO2 99%   BMI 25.86 kg/m   Physical Exam  Constitutional: He is oriented to person, place, and time. He appears well-developed and well-nourished. No distress.  HENT:  Head: Normocephalic and atraumatic.  Posterior pharyngeal swelling with uvular base exposed, otherwise inability to visualize uvula, mild tongue swelling, moderate-severe lip swelling No pooling of secretions  Eyes: Conjunctivae and EOM are normal.  Neck: Normal range of motion.  No stridor  Cardiovascular: Normal rate, regular rhythm, normal heart sounds and intact distal pulses. Exam reveals no gallop and  no friction rub.  No murmur heard. Pulmonary/Chest: Effort normal and breath sounds normal. No respiratory distress. He has no wheezes. He has no rales.  Abdominal: Soft. He exhibits no distension. There is no tenderness. There is no guarding.  Musculoskeletal: He exhibits no edema.  Neurological: He is alert and oriented to person, place, and time.  Skin: Skin is warm and dry. He is not diaphoretic.  Nursing note and vitals reviewed.    ED Treatments / Results  Labs (all labs ordered are listed, but only abnormal results are displayed) Labs Reviewed  BASIC METABOLIC PANEL - Abnormal; Notable for the following components:      Result Value   Glucose, Bld 121 (*)    Creatinine, Ser 1.36 (*)    GFR calc non Af Amer 48 (*)    GFR calc Af Amer 55 (*)    All other components within normal limits  URINALYSIS, ROUTINE W REFLEX MICROSCOPIC - Abnormal; Notable for the following components:   Ketones, ur 5 (*)    All other components within normal  limits  CBC - Abnormal; Notable for the following components:   RBC 4.07 (*)    Platelets 124 (*)    All other components within normal limits  GLUCOSE, CAPILLARY - Abnormal; Notable for the following components:   Glucose-Capillary 189 (*)    All other components within normal limits  POCT I-STAT 3, ART BLOOD GAS (G3+) - Abnormal; Notable for the following components:   pH, Arterial 7.481 (*)    pCO2 arterial 25.8 (*)    Bicarbonate 19.3 (*)    TCO2 20 (*)    Acid-base deficit 3.0 (*)    All other components within normal limits  MRSA PCR SCREENING  URINE CULTURE  CBC  CREATININE, SERUM  TRIGLYCERIDES  OCCULT BLOOD X 1 CARD TO LAB, STOOL  BLOOD GAS, ARTERIAL  COMPREHENSIVE METABOLIC PANEL  CBC    EKG EKG Interpretation  Date/Time:  Saturday July 28 2017 07:21:22 EDT Ventricular Rate:  83 PR Interval:    QRS Duration: 102 QT Interval:  380 QTC Calculation: 447 R Axis:   -25 Text Interpretation:  Sinus rhythm Abnormal R-wave progression, early transition Left ventricular hypertrophy When compared with ECG of 05/22/2015, HEART RATE has decreased Confirmed by Delora Fuel ([id number]) on 07/28/2017 7:52:38 AM   Radiology Dg Chest Port 1 View  Result Date: 07/28/2017 CLINICAL DATA:  Status post intubation EXAM: PORTABLE CHEST 1 VIEW COMPARISON:  Chest radiograph 05/01/2017 FINDINGS: ET tube terminates in the mid trachea. Normal cardiac and mediastinal contours. No consolidative pulmonary opacities. No pleural effusion or pneumothorax. Bibasilar scarring. Right greater than left apical pleuroparenchymal thickening. IMPRESSION: No acute cardiopulmonary process. Electronically Signed   By: Lovey Newcomer M.D.   On: 07/28/2017 16:17   Dg Abd Portable 1v  Result Date: 07/28/2017 CLINICAL DATA:  NG tube placement. EXAM: PORTABLE ABDOMEN - 1 VIEW COMPARISON:  05/22/2015 FINDINGS: Nasogastric tube passes below the diaphragm. Tip projects in the expected location of the distal stomach or  first portion of the duodenum. IMPRESSION: Well-positioned nasogastric tube. Electronically Signed   By: Lajean Manes M.D.   On: 07/28/2017 16:39    Procedures .Critical Care Performed by: Gareth Morgan, MD Authorized by: Gareth Morgan, MD   Critical care provider statement:    Critical care time (minutes):  30   Critical care was necessary to treat or prevent imminent or life-threatening deterioration of the following conditions:  Respiratory failure   Critical care was  time spent personally by me on the following activities:  Discussions with consultants, evaluation of patient's response to treatment, examination of patient, review of old charts, re-evaluation of patient's condition and ordering and review of laboratory studies   (including critical care time)  Medications Ordered in ED Medications  famotidine (PEPCID) IVPB 20 mg premix ( Intravenous MAR Unhold 07/28/17 1451)  levothyroxine (SYNTHROID, LEVOTHROID) injection 75 mcg (75 mcg Intravenous Given 07/28/17 1543)  metoprolol tartrate (LOPRESSOR) injection 5 mg (5 mg Intravenous Not Given 07/28/17 1658)  enoxaparin (LOVENOX) injection 40 mg (40 mg Subcutaneous Given 07/28/17 1548)  0.9 %  sodium chloride infusion ( Intravenous New Bag/Given 07/28/17 1547)  fluticasone (FLONASE) 50 MCG/ACT nasal spray 1 spray (1 spray Each Nare Not Given 07/28/17 1557)  Racepinephrine HCl 2.25 % nebulizer solution 0.5 mL ( Nebulization MAR Unhold 07/28/17 1451)  0.9 %  sodium chloride infusion ( Intravenous Continued from Pre-op 07/28/17 1521)  ondansetron (ZOFRAN) injection 4 mg ( Intravenous MAR Unhold 07/28/17 1451)  amLODipine (NORVASC) tablet 5 mg (5 mg Oral Not Given 07/28/17 1558)  labetalol (NORMODYNE,TRANDATE) injection 5 mg ( Intravenous MAR Unhold 07/28/17 1451)  dexamethasone (DECADRON) injection 10 mg (has no administration in time range)  clopidogrel (PLAVIX) tablet 75 mg (75 mg Per Tube Given 07/28/17 1658)  propofol (DIPRIVAN) 1000  MG/100ML infusion (50 mcg/kg/min  88.9 kg Intravenous New Bag/Given 07/28/17 1754)  chlorhexidine gluconate (MEDLINE KIT) (PERIDEX) 0.12 % solution 15 mL (has no administration in time range)  MEDLINE mouth rinse (has no administration in time range)  dexamethasone (DECADRON) injection 10 mg (10 mg Intravenous Given 07/28/17 0755)  famotidine (PEPCID) IVPB 20 mg premix (0 mg Intravenous Stopped 07/28/17 0834)  Racepinephrine HCl 2.25 % nebulizer solution 0.5 mL (0.5 mLs Nebulization Given 07/28/17 0944)  dexamethasone (DECADRON) injection 10 mg (10 mg Intravenous Given 07/28/17 0944)     Initial Impression / Assessment and Plan / ED Course  I have reviewed the triage vital signs and the nursing notes.  Pertinent labs & imaging results that were available during my care of the patient were reviewed by me and considered in my medical decision making (see chart for details).     77 year old male with a history of coronary artery disease on lisinopril, presents with concern for lip and throat swelling.  Patient presents with posterior pharyngeal swelling, lip swelling, and dysphonia and dysphasia.  He has no drooling or pooling of secretions, no shortness of breath, no stridor on exam.  He does not have symptoms that suggest anaphylaxis.  There is no sign of infection, Ludwig angina or other.  History and physical exam are most consistent with ACE inhibitor induced angioedema.  He is given 10 mg of IV Decadron.  He reports an allergy to Benadryl. Given famotidine IV.  ICU physician aware of patient. ENT consulted Dr. Blenda Nicely who came to bedside and performed bedside flex scope.  Second scope to be performed in one hour. At this time plan to admit to hospitalist step down if no decline in status.   Patient with slight worsening swelling on repeat bedside scope with ENT.  Will admit to ICU for closer monitoring.  Patient on my reevaluation reports pain with swallowing now, has decreased visible  posterior pharyngeal swelling. No stridor. Will continue to monitor.   While patient waiting for ICU bed, additional scope performed showing worsening swelling of AE folds.  Anesthesia contacted and patient taken to the OR with anesthesia and ENT for fiberoptic intubation.  Final Clinical Impressions(s) / ED Diagnoses   Final diagnoses:  Angioedema, initial encounter    ED Discharge Orders    None       Gareth Morgan, MD 07/28/17 1941

## 2017-07-28 NOTE — Op Note (Addendum)
Patient taken to OR with myself and anesthesia. Patient awake fiberoptically transorally intubated per Anesthsia, Dr. Ermalene Postin. I did not have to make any interventions.   PLAN:   Patient is to remain intubated to allow angioedema to resolve. Please do not let anesthetic wear off. This is a CRITICAL AIRWAY.   Please continue IV decadron 10mg , one dose this evening, and two doses tomorrow.   Will plan for trial extubation on Monday. Please make NPO at midnight on Sunday evening.   Helayne [place]in[place], MD

## 2017-07-28 NOTE — Progress Notes (Signed)
Patient arrived to unit at 13:46.   Lucius Conn, RN

## 2017-07-28 NOTE — H&P (Signed)
TRH H&P   Patient Demographics:    Daniel Herrera, is a 77 y.o. male  MRN: [id number]   DOB - October 07, 1940  Admit Date - 07/28/2017  Outpatient Primary MD for the patient is [name], NP  Referring MD/NP/PA:  Billy Fischer  Outpatient Specialists:     Patient coming from: home  Chief Complaint  Patient presents with  . Angioedema      HPI:    Daniel Herrera  is a 77 y.o. male, w CAD, Hyperlipidemia, Prostate cancer, Gout, ILD , presents with c/o lips swelling yesterday and then sensation of tongue and  throat swelling today.  He has been on lisinopril for some time.   In Ed, ENT consulted and scoped the patient, => moderate angioedema.   Wbc 8.3, Hgb 16.3, Plt 168 Na 138, K 3.9, Bun 19, Creatinine 1.36  Pt will be admitted for angioedema related to ACE-i     Review of systems:    In addition to the HPI above,  No Fever-chills, No Headache, No changes with Vision or hearing, No problems swallowing food or Liquids, No Chest pain, Cough or Shortness of Breath, No Abdominal pain, No Nausea or Vommitting, Bowel movements are regular, No Blood in stool or Urine, No dysuria, No new skin rashes or bruises, No new joints pains-aches,  No new weakness, tingling, numbness in any extremity, No recent weight gain or loss, No polyuria, polydypsia or polyphagia, No significant Mental Stressors.  A full 10 point Review of Systems was done, except as stated above, all other Review of Systems were negative.   With Past History of the following :    Past Medical History:  Diagnosis Date  . Anxiety   . Arthritis   . CAD (coronary artery disease)    a. 02/2015: DES to mid-LAD  . GERD (gastroesophageal reflux disease)   . Gout   . High cholesterol   . History of prolonged Q-T interval on ECG   . Hx of Clostridium difficile infection   . Hx of umbilical hernia repair    . Personal history of digestive disease    gastric ulcer  . Prostate cancer ([name])   . Prostate enlargement   . Skin cancer    basal cell carcinoma  . Thyroid disease       Past Surgical History:  Procedure Laterality Date  . APPENDECTOMY    . BACK SURGERY    . CARDIAC CATHETERIZATION N/A 02/21/2015   Procedure: Left Heart Cath;  Surgeon: Lorretta Harp, MD;  Location: [facility];  Service: Cardiovascular;  Laterality: N/A;  . CATARACT EXTRACTION, BILATERAL    . NASAL SINUS SURGERY    . PROSTATE BIOPSY    . right shoulder rotator cuff repair        Social History:     Social History   Tobacco Use  . Smoking status:  Never Smoker  . Smokeless tobacco: Never Used  Substance Use Topics  . Alcohol use: No     Lives - at home  Mobility - walks by self   Family History :     Family History  Problem Relation Age of Onset  . Cancer Mother   . Cancer Father        prostate  . Cancer Sister       Home Medications:   Prior to Admission medications   Medication Sig Start Date End Date Taking? Authorizing Provider  amoxicillin-clavulanate (AUGMENTIN) 875-125 MG tablet TK 1 T PO BID 04/23/17   [provider]  clobetasol ointment (TEMOVATE) 2.72 % Apply 1 application topically daily as needed (cellulitis on legs).  01/26/15   [provider]  clopidogrel (PLAVIX) 75 MG tablet TAKE 1 TABLET(75 MG) BY MOUTH DAILY 05/07/17   Lorretta Harp, MD  famotidine (PEPCID) 20 MG tablet Take 1 tablet (20 mg total) by mouth 2 (two) times daily as needed for heartburn or indigestion. 05/25/15   Janece Canterbury, MD  fluticasone (FLONASE) 50 MCG/ACT nasal spray Place 1 spray into both nostrils daily.    [provider]  levothyroxine (SYNTHROID, LEVOTHROID) 75 MCG tablet Take 75 mcg by mouth daily before breakfast.    [provider]  lisinopril (PRINIVIL,ZESTRIL) 10 MG tablet Take 10 mg by mouth daily.    [provider]  metoprolol  tartrate (LOPRESSOR) 25 MG tablet Take 1 tablet (25 mg total) by mouth 2 (two) times daily. 05/25/15   Janece Canterbury, MD  nabumetone (RELAFEN) 750 MG tablet Take 1 tablet 2 (two) times daily by mouth. 01/29/17   [provider]  nitroGLYCERIN (NITROSTAT) 0.4 MG SL tablet DISSOLVE 1 TABLET UNDER TONGUE EVERY 5 MINUTES FOR 3 DOSES AS NEEDED CHEST PAIN. IF NO RELIEF CALL 911 03/03/15   Lyda Jester M, PA-C  nystatin (MYCOSTATIN) 100000 UNIT/ML suspension Take 2.5 mLs by mouth at bedtime as needed (thrush). Swish and spit 05/10/15   [provider]  pantoprazole (PROTONIX) 40 MG tablet Take 1 tablet (40 mg total) by mouth daily. 03/09/16   Lorretta Harp, MD  Pitavastatin Calcium (LIVALO) 4 MG TABS Take 4 mg by mouth at bedtime.     [provider]  Polyethyl Glycol-Propyl Glycol (SYSTANE OP) Place 1 drop into both eyes 2 (two) times daily.    [provider]  saccharomyces boulardii (FLORASTOR) 250 MG capsule Take 1 capsule (250 mg total) by mouth 2 (two) times daily. 05/25/15   Janece Canterbury, MD     Allergies:     Allergies  Allergen Reactions  . Benadryl [Diphenhydramine Hcl] Swelling    Facial swelling from high dose (tolerates Advil PM as needed)  . Darvocet [Propoxyphene N-Acetaminophen] Other (See Comments)    hallucination  . Darvon [Propoxyphene Hcl] Other (See Comments)    Hallucination   . Tylenol [Acetaminophen] Swelling    Foot swelling     Physical Exam:   Vitals  Blood pressure (!) 150/83, pulse 74, temperature 98.2 F (36.8 C), temperature source Oral, resp. rate 12, height 6\' 1"  (1.854 m), weight 87.5 kg (193 lb), SpO2 99 %.   1. General lying in bed in NAD  2. Normal affect and insight, Not Suicidal or Homicidal, Awake Alert, Oriented X 3.  3. No F.N deficits, ALL C.Nerves Intact, Strength 5/5 all 4 extremities, Sensation intact all 4 extremities, Plantars down going.  4. Ears and Eyes appear Normal, Conjunctivae  clear, PERRLA. Moist Oral Mucosa., + lip swelling, upper and lower, and slight swelling of back of oropharynx  5. Supple Neck, No JVD, No cervical lymphadenopathy appriciated, No Carotid Bruits.  NO STRIDOR  6. Symmetrical Chest wall movement, Good air movement bilaterally, CTAB.  7. RRR, No Gallops, Rubs or Murmurs, No Parasternal Heave.  8. Positive Bowel Sounds, Abdomen Soft, No tenderness, No organomegaly appriciated,No rebound -guarding or rigidity.  9.  No Cyanosis, Normal Skin Turgor, No Skin Rash or Bruise.  10. Good muscle tone,  joints appear normal , no effusions, Normal ROM.  11. No Palpable Lymph Nodes in Neck or Axillae     Data Review:    CBC Recent Labs  Lab 07/28/17 0720  WBC 8.3  HGB 16.3  HCT 48.1  PLT 168  MCV 96.4  MCH 32.7  MCHC 33.9  RDW 13.0   ------------------------------------------------------------------------------------------------------------------  Chemistries  Recent Labs  Lab 07/28/17 0720  NA 138  K 3.9  CL 103  CO2 25  GLUCOSE 121*  BUN 19  CREATININE 1.36*  CALCIUM 9.1   ------------------------------------------------------------------------------------------------------------------ estimated creatinine clearance is 49.8 mL/min (A) (by C-G formula based on SCr of 1.36 mg/dL (H)). ------------------------------------------------------------------------------------------------------------------ No results for input(s): TSH, T4TOTAL, T3FREE, THYROIDAB in the last 72 hours.  Invalid input(s): FREET3  Coagulation profile No results for input(s): INR, PROTIME in the last 168 hours. ------------------------------------------------------------------------------------------------------------------- No results for input(s): DDIMER in the last 72 hours. -------------------------------------------------------------------------------------------------------------------  Cardiac Enzymes No results for input(s): CKMB, TROPONINI,  MYOGLOBIN in the last 168 hours.  Invalid input(s): CK ------------------------------------------------------------------------------------------------------------------ No results found for: BNP   ---------------------------------------------------------------------------------------------------------------  Urinalysis    Component Value Date/Time   COLORURINE YELLOW 05/22/2015 2110   APPEARANCEUR CLEAR 05/22/2015 2110   LABSPEC 1.016 05/22/2015 2110   PHURINE 5.5 05/22/2015 2110   GLUCOSEU NEGATIVE 05/22/2015 2110   HGBUR NEGATIVE 05/22/2015 2110   [place] NEGATIVE 05/22/2015 2110   KETONESUR NEGATIVE 05/22/2015 2110   PROTEINUR NEGATIVE 05/22/2015 2110   UROBILINOGEN 0.2 10/27/2012 2246   NITRITE NEGATIVE 05/22/2015 2110   LEUKOCYTESUR NEGATIVE 05/22/2015 2110    ----------------------------------------------------------------------------------------------------------------   Imaging Results:    No results found.     Assessment & Plan:    Principal Problem:   Angioedema    Angioedema Decadron 10mg  iv q6h  pepcid 20mg  iv bid ENT consult, much appreciated  Hypertension Lopressor 5mg  iv q6h   Gerd pepcid iv  Hypothyroidism Levothyroxine 75 micrograms iv qday  CAD Hold plavix, resume when can swallow  DVT Prophylaxis   Lovenox - SCDs  AM Labs Ordered, also please review Full Orders  Family Communication: Admission, patients condition and plan of care including tests being ordered have been discussed with the patient  who indicate understanding and agree with the plan and Code Status.  Code Status FU LL CODE  Likely DC to  home  Condition GUARDED    Consults called:  ENT  Admission status: observation   Time spent in minutes : 45   Jani Gravel M.D on 07/28/2017 at 10:06 AM  Between 7am to 7pm - Pager - [phone]  . After 7pm go to [email or web address] - password [facility]  [facility] - Office  [phone]

## 2017-07-28 NOTE — Procedures (Signed)
Procedure: Transnasal Flexible Laryngoscopy  Preoperative Diagnosis: angioedema Postoperative Diagnosis: same Procedure: Transnasal fiberoptic laryngoscopy.  Estimated Blood Loss: 0 mL.  Complications: None.  Findings: The nasal cavity and nasopharynx are unremarkable. There are no suspicious findings in the nasopharynx or Fossa of Rosenmller. The soft palate, posterior pharyngeal walls, and epiglottis demonstrate improved edema compared to two hours ago. The supraglottic swelling, however has progressed. There is angioedema of bilateral AE folds and false folds, with slight hooding over the vocal cords but still with a visible airway, though partial viewing of the true cords is available now.    Description of Procedure: With the patient in the sitting position, topical  Afrin-lidocaine mixture in an atomizer was applied to the nose. The scope was passed through the nose. Examination was carried out of the nose, nasopharynx, oropharynx, hypopharynx, and larynx with findings as noted above. Scope was removed.  The patient tolerated the procedure well.    Plan:  I have discussed an elective awake fiberoptic intubation with possible tracheotomy to be done in the operating room with the patient and his family. Risks and benefits discussed. They are in agreement with plan. Patient in ICU now. Anesthesia and OR contacted.   Helayne Seminole

## 2017-07-28 NOTE — Anesthesia Postprocedure Evaluation (Signed)
Anesthesia Post Note  Patient: Daniel Herrera  Procedure(s) Performed: EXAM UNDER ANESTHESIA; FIBEROPTIC INTUBATION (N/A )     Patient location during evaluation: SICU Anesthesia Type: General Level of consciousness: sedated Pain management: pain level controlled Vital Signs Assessment: post-procedure vital signs reviewed and stable Respiratory status: patient remains intubated per anesthesia plan Cardiovascular status: stable Postop Assessment: no apparent nausea or vomiting Anesthetic complications: no    Last Vitals:  Vitals:   07/28/17 1530 07/28/17 1545  BP: 107/74 116/82  Pulse:    Resp: 18 18  Temp:    SpO2: 99% 99%    Last Pain:  Vitals:   07/28/17 1342  TempSrc:   PainSc: 0-No pain                 Ramir Malerba

## 2017-07-28 NOTE — Progress Notes (Signed)
Pt transported to OR for intubation

## 2017-07-28 NOTE — Transfer of Care (Signed)
Immediate Anesthesia Transfer of Care Note  Patient: Daniel Herrera  Procedure(s) Performed: EXAM UNDER ANESTHESIA; FIBEROPTIC INTUBATION (N/A )  Patient Location: ICU  Anesthesia Type:General  Level of Consciousness: unresponsive and Patient remains intubated per anesthesia plan  Airway & Oxygen Therapy: Patient remains intubated per anesthesia plan and Patient placed on Ventilator (see vital sign flow sheet for setting)  Post-op Assessment: Report given to RN and Post -op Vital signs reviewed and stable  Post vital signs: Reviewed and stable  Last Vitals:  Vitals Value Taken Time  BP 103/78 07/28/2017  3:15 PM  Temp    Pulse    Resp 18 07/28/2017  3:18 PM  SpO2 99 % 07/28/2017  3:18 PM  Vitals shown include unvalidated device data.  Last Pain:  Vitals:   07/28/17 1342  TempSrc:   PainSc: 0-No pain      Patients Stated Pain Goal: 0 (09/62/83 6629)  Complications: No apparent anesthesia complications

## 2017-07-28 NOTE — Consult Note (Signed)
[name]  Referring Physician: Dr. Billy Fischer Primary Care Physician: [name], NP Patient Location at Initial Consult: Emergency Department Chief Complaint/Reason for Consult: angioedema  History of Presenting Illness:  History obtained from the patient Daniel Herrera is a  77 y.o. male presenting with  lip swelling.  The problem began acutely last night and became worse. It began with upper lip swelling and then he began to have some difficulty with throat pain. He has had some pain with swallowing, but is able to tolerate his own secretions. There is no stridor or shortness of breath. He takes lisinopril for HTN at home, most recently took it last night. Never had this problem in the past. Has received 10mg  IV decadron and a dose of famotidine. He is allergic to benadryl. On room air. Lips have improved since arrival, as has his speech articulation.   Here today with his son.   Past Medical History:  Diagnosis Date  . Anxiety   . Arthritis   . CAD (coronary artery disease)    a. 02/2015: DES to mid-LAD  . GERD (gastroesophageal reflux disease)   . Gout   . High cholesterol   . History of prolonged Q-T interval on ECG   . Hx of Clostridium difficile infection   . Hx of umbilical hernia repair   . Personal history of digestive disease    gastric ulcer  . Prostate cancer ([place])   . Prostate enlargement   . Skin cancer    basal cell carcinoma  . Thyroid disease     Past Surgical History:  Procedure Laterality Date  . APPENDECTOMY    . BACK SURGERY    . CARDIAC CATHETERIZATION N/A 02/21/2015   Procedure: Left Heart Cath;  Surgeon: Lorretta Harp, MD;  Location: [facility];  Service: Cardiovascular;  Laterality: N/A;  . CATARACT EXTRACTION, BILATERAL    . NASAL SINUS SURGERY    . PROSTATE BIOPSY    . right shoulder rotator cuff repair      Family History  Problem Relation Age of Onset  . Cancer Mother    . Cancer Father        prostate  . Cancer Sister     Social History   Socioeconomic History  . Marital status: Married    Spouse name: Not on file  . Number of children: Not on file  . Years of education: Not on file  . Highest education level: Not on file  Occupational History  . Occupation: retired  Scientific laboratory technician  . Financial resource strain: Not on file  . Food insecurity:    Worry: Not on file    Inability: Not on file  . Transportation needs:    Medical: Not on file    Non-medical: Not on file  Tobacco Use  . Smoking status: Never Smoker  . Smokeless tobacco: Never Used  Substance and Sexual Activity  . Alcohol use: No  . Drug use: No  . Sexual activity: Not on file  Lifestyle  . Physical activity:    Days per week: Not on file    Minutes per session: Not on file  . Stress: Not on file  Relationships  . Social connections:    Talks on phone: Not on file    Gets together: Not on file    Attends religious service: Not on file    Active member of club or organization: Not on file    Attends  meetings of clubs or organizations: Not on file    Relationship status: Not on file  Other Topics Concern  . Not on file  Social History Narrative  . Not on file    No current facility-administered medications on file prior to encounter.    Current Outpatient Medications on File Prior to Encounter  Medication Sig Dispense Refill  . amoxicillin-clavulanate (AUGMENTIN) 875-125 MG tablet TK 1 T PO BID  0  . clobetasol ointment (TEMOVATE) 5.28 % Apply 1 application topically daily as needed (cellulitis on legs).   1  . clopidogrel (PLAVIX) 75 MG tablet TAKE 1 TABLET(75 MG) BY MOUTH DAILY 90 tablet 2  . famotidine (PEPCID) 20 MG tablet Take 1 tablet (20 mg total) by mouth 2 (two) times daily as needed for heartburn or indigestion. 60 tablet 0  . fluticasone (FLONASE) 50 MCG/ACT nasal spray Place 1 spray into both nostrils daily.    [name] levothyroxine (SYNTHROID, LEVOTHROID) 75  MCG tablet Take 75 mcg by mouth daily before breakfast.    . lisinopril (PRINIVIL,ZESTRIL) 10 MG tablet Take 10 mg by mouth daily.    . metoprolol tartrate (LOPRESSOR) 25 MG tablet Take 1 tablet (25 mg total) by mouth 2 (two) times daily. 90 tablet 6  . nabumetone (RELAFEN) 750 MG tablet Take 1 tablet 2 (two) times daily by mouth.  1  . nitroGLYCERIN (NITROSTAT) 0.4 MG SL tablet DISSOLVE 1 TABLET UNDER TONGUE EVERY 5 MINUTES FOR 3 DOSES AS NEEDED CHEST PAIN. IF NO RELIEF CALL 911 75 tablet 2  . nystatin (MYCOSTATIN) 100000 UNIT/ML suspension Take 2.5 mLs by mouth at bedtime as needed (thrush). Swish and spit  1  . pantoprazole (PROTONIX) 40 MG tablet Take 1 tablet (40 mg total) by mouth daily. 30 tablet 11  . Pitavastatin Calcium (LIVALO) 4 MG TABS Take 4 mg by mouth at bedtime.     Vladimir Faster Glycol-Propyl Glycol (SYSTANE OP) Place 1 drop into both eyes 2 (two) times daily.    [name] saccharomyces boulardii (FLORASTOR) 250 MG capsule Take 1 capsule (250 mg total) by mouth 2 (two) times daily. 60 capsule 3    Allergies  Allergen Reactions  . Benadryl [Diphenhydramine Hcl] Swelling    Facial swelling from high dose (tolerates Advil PM as needed)  . Darvocet [Propoxyphene N-Acetaminophen] Other (See Comments)    hallucination  . Darvon [Propoxyphene Hcl] Other (See Comments)    Hallucination   . Tylenol [Acetaminophen] Swelling    Foot swelling     Review of Systems: Completed, negative except for above HPI   OBJECTIVE: Vital Signs: Vitals:   07/28/17 0800 07/28/17 0911  BP: (!) 154/109 (!) 150/83  Pulse: 75 74  Resp: 11 12  Temp:    SpO2: 99% 99%    I&O  Intake/Output Summary (Last 24 hours) at 07/28/2017 0935 Last data filed at 07/28/2017 0834 Gross per 24 hour  Intake 50 ml  Output -  Net 50 ml    Physical Exam General: Well developed, well nourished. No acute distress. Voice strong, no dypshonia  Head/Face: Normocephalic, atraumatic. No scars or lesions. No sinus  tenderness. Facial nerve intact and equal bilaterally. No facial lacerations. Salivary glands non tender and without palpable masses  Eyes: Globes well positioned, no proptosis Lids: No periorbital edema/ecchymosis. No lid laceration Conjunctiva: No chemosis, hemorrhage PERRL, EOMI  Ears: No gross deformity. Normal external canal. Tympanic membrane intact bilaterally  Hearing: Normal speech reception.  Nose: No gross deformity or lesions. No purulent discharge.  Septum midline. No turbinate hypertrophy.  Mouth/Oropharynx: Lips with mild edema on upper lip. Dentition good. The soft palate and uvula demonstrate boggy edema bilaterally. Tongue demonstrates geographic tongue lesions, no mucosal ulcerations. No FOM swelling.   Larynx: See TFL  Nasopharynx: See TFL  Neck: Trachea midline. No masses. No thyromegaly or nodules palpated. No crepitus.  Lymphatic: No lymphadenopathy in the neck.  Respiratory: No stridor or distress. On room air. Tolerating secretions well.   Cardiovascular: Regular rate and rhythm.  Extremities: No edema or cyanosis. Warm and well-perfused.  Skin: No scars or lesions on face or neck.  Neurologic: CN II-XII intact. Moving all extremities without gross abnormality.  Other:      Labs: Lab Results  Component Value Date   WBC 8.3 07/28/2017   HGB 16.3 07/28/2017   HCT 48.1 07/28/2017   PLT 168 07/28/2017   CHOL 118 05/24/2015   TRIG 189 (H) 05/24/2015   HDL 32 (L) 05/24/2015   ALT 23 05/24/2015   AST 23 05/24/2015   NA 138 07/28/2017   K 3.9 07/28/2017   CL 103 07/28/2017   CREATININE 1.36 (H) 07/28/2017   BUN 19 07/28/2017   CO2 25 07/28/2017   TSH 0.988 04/02/2015   HGBA1C 6.4 (H) 05/24/2015     Review of Ancillary Data / Diagnostic Tests: Labs reviewed Discussed case with Dr. Billy Fischer.   Procedure: Transnasal Flexible Laryngoscopy  Preoperative Diagnosis: angioedema Postoperative Diagnosis: same Procedure: Transnasal fiberoptic laryngoscopy.   Estimated Blood Loss: 0 mL.  Complications: None.  Findings: The nasal cavity and nasopharynx are unremarkable. There are no suspicious findings in the nasopharynx or Fossa of Rosenmller. The soft palate bilaterally demonstrates boggy angioedema. The laryngeal surface of the epiglottis appears normal. The lingual surface of the epiglottis demonstrates mild angioedema. Posterior pharyngeal walls with mild angioedema. The left AE fold demonstrates mild angioedema. The airway remains patent. The bilateral false and true folds are patent. There is no pooling of secretions or aspiration.  Description of Procedure: With the patient in the sitting position, topical  Afrin-lidocaine mixture in an atomizer was applied to the nose. The scope was passed through the nose. Examination was carried out of the nose, nasopharynx, oropharynx, hypopharynx, and larynx with findings as noted above. Scope was removed.  The patient tolerated the procedure well.      ASSESSMENT:  77 y.o. male with lisinopril-related angioedema. Oropharynx demonstrates moderate angioedema based on scope examination.   RECOMMENDATIONS: -Patient is to receive another dose of IV Decadron now. Patient needs to complete a series of at least 3-4 doses of 10mg  IV decadron -Racemic epinephrine -Continue H2 blocker -I will repeat TFL examination in 1 hour, sooner if symptoms worsen. At this point, no acute airway interventions are required. Patient may wait to be transferred until repeat scope examination -Internal Medicine/Intensivist to determine alternate HTN medications -Keep NPO for now   Gavin Pound, MD  [facility], [place] Office phone [phone]

## 2017-07-28 NOTE — ED Triage Notes (Signed)
Pt reports lips and face started to swell on Friday . Pt came today Ed this AM because his throat felt like swelling started. Pt is currently taking lisinopril . Last dose of Lisinopril was 2000 last night. Pt A/O and maintaining air way.  Pt also reports all to benadryl.

## 2017-07-29 DIAGNOSIS — T783XXD Angioneurotic edema, subsequent encounter: Secondary | ICD-10-CM

## 2017-07-29 LAB — COMPREHENSIVE METABOLIC PANEL
ALT: 20 U/L (ref 17–63)
AST: 18 U/L (ref 15–41)
Albumin: 3 g/dL — ABNORMAL LOW (ref 3.5–5.0)
Alkaline Phosphatase: 54 U/L (ref 38–126)
Anion gap: 11 (ref 5–15)
BUN: 23 mg/dL — ABNORMAL HIGH (ref 6–20)
CO2: 21 mmol/L — ABNORMAL LOW (ref 22–32)
Calcium: 8.7 mg/dL — ABNORMAL LOW (ref 8.9–10.3)
Chloride: 106 mmol/L (ref 101–111)
Creatinine, Ser: 1.26 mg/dL — ABNORMAL HIGH (ref 0.61–1.24)
GFR calc Af Amer: >60 mL/min (ref >60)
GFR calc non Af Amer: 52 mL/min — ABNORMAL LOW (ref >60)
Glucose, Bld: 171 mg/dL — ABNORMAL HIGH (ref 65–99)
Potassium: 4 mmol/L (ref 3.5–5.1)
Sodium: 138 mmol/L (ref 135–145)
Total Bilirubin: 0.7 mg/dL (ref 0.3–1.2)
Total Protein: 5.1 g/dL — ABNORMAL LOW (ref 6.5–8.1)

## 2017-07-29 LAB — CBC
HCT: 39 % (ref 39.0–52.0)
Hemoglobin: 13.2 g/dL (ref 13.0–17.0)
MCH: 32 pg (ref 26.0–34.0)
MCHC: 33.8 g/dL (ref 30.0–36.0)
MCV: 94.7 fL (ref 78.0–100.0)
Platelets: 139 10*3/uL — ABNORMAL LOW (ref 150–400)
RBC: 4.12 MIL/uL — ABNORMAL LOW (ref 4.22–5.81)
RDW: 12.5 % (ref 11.5–15.5)
WBC: 4.9 10*3/uL (ref 4.0–10.5)

## 2017-07-29 LAB — GLUCOSE, CAPILLARY: Glucose-Capillary: 157 mg/dL — ABNORMAL HIGH (ref 65–99)

## 2017-07-29 MED ORDER — LEVOTHYROXINE SODIUM 75 MCG PO TABS
75.0000 ug | ORAL_TABLET | Freq: Every day | ORAL | Status: DC
  Administered 2017-07-29 – 2017-08-03 (×6): 75 ug
  Filled 2017-07-29 (×7): qty 1

## 2017-07-29 MED ORDER — KCL IN DEXTROSE-NACL 30-5-0.45 MEQ/L-%-% IV SOLN
INTRAVENOUS | Status: DC
  Administered 2017-07-29 – 2017-07-31 (×4): via INTRAVENOUS
  Filled 2017-07-29 (×6): qty 1000

## 2017-07-29 MED ORDER — DEXAMETHASONE SODIUM PHOSPHATE 10 MG/ML IJ SOLN
10.0000 mg | Freq: Three times a day (TID) | INTRAMUSCULAR | Status: AC
  Administered 2017-07-29 – 2017-07-30 (×2): 10 mg via INTRAVENOUS
  Filled 2017-07-29 (×2): qty 1

## 2017-07-29 NOTE — Progress Notes (Signed)
PULMONARY / CRITICAL CARE MEDICINE   Name: Daniel Herrera MRN: [id number] DOB: 12-26-1940    ADMISSION DATE:  07/28/2017 CONSULTATION DATE:  07/28/2017  REFERRING MD:  Daniel Herrera (ED)  CHIEF COMPLAINT:  Dyspnea  HISTORY OF PRESENT ILLNESS:   This is a 77 year old with a history of coronary disease and prostate cancer who presents complaining of dyspnea.  He began having some cough for several days followed but by what felt like ulcers in his mouth.  He was seen by primary care physician the day pta who prescribed cough syrup however he had increasing mouth swelling and decided not to take it.  He presented on the AM of admission with an increasing sense of swelling in his mouth and change in his vocalization.  He had not had any difficulties in swallowing his own secretions.       He was examined by ENT who felt that there was some boggy angioedema of the soft palate, the left area retinal fold the lingual surface of the epiglottis.  Should be noted that Daniel Herrera has been taking lisinopril for a very long time.  He has had no change in his dose and he has had no new medicines introduced.  He did not have any associated urticaria or wheezing.  There is no family history of angioedema He underwent repeat laryngoscopy by ENT two hours after their initial examination and found that the soft palate, posterior pharyngeal walls, and epiglottis demonstrated improved edema compared to two hours prior. The supraglottic swelling, however had progressed. There was angioedema of bilateral AE folds and false folds, with slight hooding over the vocal cords but still with a visible airway, though partial viewing of the true cords was available. Based on this changed finding, it was decided to electively intubate the patient in the OR with plans to continue IV Decadron and planned extubation on 4/29.  PAST MEDICAL HISTORY :  He  has a past medical history of Anxiety, Arthritis, CAD (coronary artery disease),  GERD (gastroesophageal reflux disease), Gout, High cholesterol, History of prolonged Q-T interval on ECG, Clostridium difficile infection, umbilical hernia repair, Personal history of digestive disease, Prostate cancer ([place]), Prostate enlargement, Skin cancer, and Thyroid disease.  PAST SURGICAL HISTORY: He  has a past surgical history that includes Back surgery; Nasal sinus surgery; Appendectomy; Prostate biopsy; Cataract extraction, bilateral; right shoulder rotator cuff repair; and Cardiac catheterization (N/A, 02/21/2015).  Allergies  Allergen Reactions  . Lisinopril Swelling    Facial and lip swelling. And throat swelling.   . Benadryl [Diphenhydramine Hcl] Swelling    Facial swelling from high dose (tolerates Advil PM as needed)  . Darvocet [Propoxyphene N-Acetaminophen] Other (See Comments)    hallucination  . Darvon [Propoxyphene Hcl] Other (See Comments)    Hallucination   . Tylenol [Acetaminophen] Swelling    Foot swelling    No current facility-administered medications on file prior to encounter.    Current Outpatient Medications on File Prior to Encounter  Medication Sig  . clobetasol ointment (TEMOVATE) 9.41 % Apply 1 application topically daily as needed (cellulitis on legs).   . clopidogrel (PLAVIX) 75 MG tablet TAKE 1 TABLET(75 MG) BY MOUTH DAILY  . famotidine (PEPCID) 20 MG tablet Take 1 tablet (20 mg total) by mouth 2 (two) times daily as needed for heartburn or indigestion.  [name] levothyroxine (SYNTHROID, LEVOTHROID) 75 MCG tablet Take 75 mcg by mouth daily before breakfast.  . lisinopril (PRINIVIL,ZESTRIL) 10 MG tablet Take 10 mg by mouth daily.  [name]  metoprolol tartrate (LOPRESSOR) 25 MG tablet Take 1 tablet (25 mg total) by mouth 2 (two) times daily.  . nabumetone (RELAFEN) 750 MG tablet Take 1 tablet by mouth daily.   . pantoprazole (PROTONIX) 40 MG tablet Take 1 tablet (40 mg total) by mouth daily.  . Pitavastatin Calcium (LIVALO) 4 MG TABS Take 4 mg by mouth at  bedtime.   Daniel Herrera Glycol-Propyl Glycol (SYSTANE OP) Place 1 drop into both eyes 2 (two) times daily.  [name] saccharomyces boulardii (FLORASTOR) 250 MG capsule Take 1 capsule (250 mg total) by mouth 2 (two) times daily.  . fluticasone (FLONASE) 50 MCG/ACT nasal spray Place 1 spray into both nostrils daily.  . nitroGLYCERIN (NITROSTAT) 0.4 MG SL tablet DISSOLVE 1 TABLET UNDER TONGUE EVERY 5 MINUTES FOR 3 DOSES AS NEEDED CHEST PAIN. IF NO RELIEF CALL 911  . nystatin (MYCOSTATIN) 100000 UNIT/ML suspension Take 2.5 mLs by mouth at bedtime as needed (thrush). Swish and spit    FAMILY HISTORY:  His indicated that his mother is deceased. He indicated that his father is deceased. He indicated that the status of his sister is unknown. He indicated that his maternal grandmother is deceased. He indicated that his maternal grandfather is deceased. He indicated that his paternal grandmother is deceased. He indicated that his paternal grandfather is deceased.   SOCIAL HISTORY: He  reports that he has never smoked. He has never used smokeless tobacco. He reports that he does not drink alcohol or use drugs.  SUBJECTIVE:  Patient is sedated on propofol  VITAL SIGNS: BP (!) 151/82   Pulse 81   Temp 97.7 F (36.5 C) (Oral)   Resp 14   Ht 6\' 1"  (1.854 m)   Wt 90.4 kg (199 lb 4.7 oz)   SpO2 100%   BMI 26.29 kg/m   VENTILATOR SETTINGS: Vent Mode: PRVC FiO2 (%):  [40 %] 40 % Set Rate:  [14 bmp-18 bmp] 14 bmp Vt Set:  [600 mL] 600 mL PEEP:  [5 cmH20] 5 cmH20 Plateau Pressure:  [10 cmH20-16 cmH20] 15 cmH20  INTAKE / OUTPUT: I/O last 3 completed shifts: In: 1526.8 [I.V.:1426.8; IV Piggyback:100] Out: 1000 [Urine:1000]  PHYSICAL EXAMINATION: General:  WD/WD WM no apparent respiratory distress Neuro:  Unable to evaluate due to sedation HEENT:  Small pupils but RRL; ETT in place Cardiovascular:  Nl S1 and S2. No m/r/g Lungs:  Clear bilaterally Abdomen:  Supple, no guarding, +BS, no  organomegaly Musculoskeletal:  No active joints Skin:  No C/C/E  LABS:  BMET Recent Labs  Lab 07/28/17 0720 07/28/17 1332 07/29/17 0458  NA 138  --  138  K 3.9  --  4.0  CL 103  --  106  CO2 25  --  21*  BUN 19  --  23*  CREATININE 1.36* 1.01 1.26*  GLUCOSE 121*  --  171*    Electrolytes Recent Labs  Lab 07/28/17 0720 07/29/17 0458  CALCIUM 9.1 8.7*    CBC Recent Labs  Lab 07/28/17 0720 07/28/17 1332 07/29/17 0458  WBC 8.3 6.5 4.9  HGB 16.3 13.3 13.2  HCT 48.1 39.1 39.0  PLT 168 124* 139*    Coag's No results for input(s): APTT, INR in the last 168 hours.  Sepsis Markers No results for input(s): LATICACIDVEN, PROCALCITON, O2SATVEN in the last 168 hours.  ABG Recent Labs  Lab 07/28/17 1629  PHART 7.481*  PCO2ART 25.8*  PO2ART 98.0    Liver Enzymes Recent Labs  Lab 07/29/17 0458  AST  18  ALT 20  ALKPHOS 54  BILITOT 0.7  ALBUMIN 3.0*    Cardiac Enzymes No results for input(s): TROPONINI, PROBNP in the last 168 hours.  Glucose Recent Labs  Lab 07/28/17 1616 07/29/17 0742  GLUCAP 189* 157*    Imaging Dg Chest Port 1 View  Result Date: 07/28/2017 CLINICAL DATA:  Status post intubation EXAM: PORTABLE CHEST 1 VIEW COMPARISON:  Chest radiograph 05/01/2017 FINDINGS: ET tube terminates in the mid trachea. Normal cardiac and mediastinal contours. No consolidative pulmonary opacities. No pleural effusion or pneumothorax. Bibasilar scarring. Right greater than left apical pleuroparenchymal thickening. IMPRESSION: No acute cardiopulmonary process. Electronically Signed   By: Lovey Newcomer M.D.   On: 07/28/2017 16:17   Dg Abd Portable 1v  Result Date: 07/28/2017 CLINICAL DATA:  NG tube placement. EXAM: PORTABLE ABDOMEN - 1 VIEW COMPARISON:  05/22/2015 FINDINGS: Nasogastric tube passes below the diaphragm. Tip projects in the expected location of the distal stomach or first portion of the duodenum. IMPRESSION: Well-positioned nasogastric tube.  Electronically Signed   By: Lajean Manes M.D.   On: 07/28/2017 16:39     STUDIES:  Laryngoscopy  CULTURES: None  ANTIBIOTICS: None  SIGNIFICANT EVENTS: Increasing angioedema of the upper airway requiring intubation to protect  LINES/TUBES: ETT Foley Periph IV  DISCUSSION: Angioedema of the upper airway possibly related to lisinopril, although he had been on this med for years and had no recent increase in dosage. ENT plan is for trial extubation tomorrow.  ASSESSMENT / PLAN:  PULMONARY A: No acute pulm problem P:   Allow to wake up today on vent with SBT but not extubate  CARDIOVASCULAR A:  No issues P:  Telemetry  RENAL A:   Mild azotemia P:   Increase fluids slightly and change to maintenance D5 1/2 NS  GASTROINTESTINAL A:   No acute problem P:   Prophylaxis while on vent  HEMATOLOGIC A:   Nl H&H P:  Trend  ENDOCRINE A:   Hx hypothyroid   P:   Cont Synthroid   NEUROLOGIC A:   Sedation to minimize trauma to airway P:   RASS goal: -1   Critical care time: 30 min  Pulmonary and [place] Pager: [phone]  07/29/2017, 8:16 AM

## 2017-07-30 ENCOUNTER — Encounter (HOSPITAL_COMMUNITY): Payer: Self-pay | Admitting: Certified Registered Nurse Anesthetist

## 2017-07-30 ENCOUNTER — Inpatient Hospital Stay (HOSPITAL_COMMUNITY): Payer: Medicare Other | Admitting: Certified Registered Nurse Anesthetist

## 2017-07-30 ENCOUNTER — Encounter (HOSPITAL_COMMUNITY)
Admission: EM | Disposition: A | Discharge status: Home Health | Payer: Self-pay | Source: Home / Self Care | Attending: Otolaryngology

## 2017-07-30 ENCOUNTER — Other Ambulatory Visit: Payer: Self-pay

## 2017-07-30 DIAGNOSIS — R0689 Other abnormalities of breathing: Secondary | ICD-10-CM

## 2017-07-30 DIAGNOSIS — E119 Type 2 diabetes mellitus without complications: Secondary | ICD-10-CM | POA: Diagnosis not present

## 2017-07-30 DIAGNOSIS — I2510 Atherosclerotic heart disease of native coronary artery without angina pectoris: Secondary | ICD-10-CM | POA: Diagnosis not present

## 2017-07-30 DIAGNOSIS — Z430 Encounter for attention to tracheostomy: Secondary | ICD-10-CM | POA: Diagnosis not present

## 2017-07-30 DIAGNOSIS — I252 Old myocardial infarction: Secondary | ICD-10-CM | POA: Diagnosis not present

## 2017-07-30 SURGERY — CREATION, TRACHEOSTOMY
Anesthesia: General

## 2017-07-30 MED ORDER — ATROPINE SULFATE 1 MG/10ML IJ SOSY
PREFILLED_SYRINGE | INTRAMUSCULAR | Status: AC
  Filled 2017-07-30: qty 10

## 2017-07-30 MED ORDER — DEXAMETHASONE SODIUM PHOSPHATE 10 MG/ML IJ SOLN
10.0000 mg | Freq: Four times a day (QID) | INTRAMUSCULAR | Status: DC
  Filled 2017-07-30 (×2): qty 1

## 2017-07-30 MED ORDER — LIDOCAINE VISCOUS 2 % MT SOLN
15.0000 mL | Freq: Once | OROMUCOSAL | Status: AC
  Administered 2017-07-30: 15 mL via OROMUCOSAL
  Filled 2017-07-30: qty 15

## 2017-07-30 MED ORDER — ORAL CARE MOUTH RINSE: 15.0000 mL | OROMUCOSAL | Status: DC

## 2017-07-30 MED ORDER — MIDAZOLAM HCL 2 MG/2ML IJ SOLN
INTRAMUSCULAR | Status: AC
  Filled 2017-07-30: qty 2

## 2017-07-30 MED ORDER — DEXAMETHASONE SODIUM PHOSPHATE 10 MG/ML IJ SOLN
10.0000 mg | Freq: Once | INTRAMUSCULAR | Status: AC
  Administered 2017-07-30: 10 mg via INTRAVENOUS
  Filled 2017-07-30: qty 1

## 2017-07-30 MED ORDER — ORAL CARE MOUTH RINSE: 15.0000 mL | Freq: Four times a day (QID) | OROMUCOSAL | Status: DC

## 2017-07-30 NOTE — Progress Notes (Signed)
Aljishi, MD verbal order to change pt's fluid rate to 50 ml/hr.   Lucius Conn, RN

## 2017-07-30 NOTE — Procedures (Signed)
Procedure Note  Pre-operative Diagnosis: angioedema  Post-operative Diagnosis: same  Anesthesia:none/local only  Endoscopy Type:  Flexible Laryngoscopy with extubation  Procedure Details:   The patient was placed in the sitting position.  After topical anesthesia and decongestion, the 4 mm laryngoscope was passed.  The nasal cavities, nasopharynx, oropharynx, hypopharynx, and larynx were all examined.  The 7.5 ETT was removed over a Cook catheter. Vocal cords were examined during respiration and phonation  without edema. There was mild posterior pharyngeal edema, improved from 2 days ago.  True cords without edema. Epiglottic edema 90% resolved. Cook catheter was removed. Patient monitored by anesthesia and myself at the bedside and tolerated extubation well.   Condition: Stable.  Patient tolerated procedure well.  Complications: None

## 2017-07-30 NOTE — Progress Notes (Signed)
Pt requesting abdominal CT while he is in the hospital. Patient states, "I had diarrhea for 3 days before I came in."   Lucius Conn, RN

## 2017-07-30 NOTE — Progress Notes (Signed)
PULMONARY / CRITICAL CARE MEDICINE   Name: Daniel Herrera MRN: [id number] DOB: [birth year]    ADMISSION DATE:  07/28/2017 CONSULTATION DATE:  07/28/2017  REFERRING MD:  Billy Fischer (ED)  Interval Hx: Remains intubated, sedation is weaned down following commands. Family at bedside. Has good cuff leak   CHIEF COMPLAINT:  Dyspnea  HISTORY OF PRESENT ILLNESS:   This is a 77 year old with a history of coronary disease and prostate cancer who presents complaining of dyspnea.  He began having some cough for several days followed but by what felt like ulcers in his mouth.  He was seen by primary care physician the day pta who prescribed cough syrup however he had increasing mouth swelling and decided not to take it.  He presented on the AM of admission with an increasing sense of swelling in his mouth and change in his vocalization.  He had not had any difficulties in swallowing his own secretions.       He was examined by ENT who felt that there was some boggy angioedema of the soft palate, the left area retinal fold the lingual surface of the epiglottis.  Should be noted that Daniel Herrera has been taking lisinopril for a very long time.  He has had no change in his dose and he has had no new medicines introduced.  He did not have any associated urticaria or wheezing.  There is no family history of angioedema He underwent repeat laryngoscopy by ENT two hours after their initial examination and found that the soft palate, posterior pharyngeal walls, and epiglottis demonstrated improved edema compared to two hours prior. The supraglottic swelling, however had progressed. There was angioedema of bilateral AE folds and false folds, with slight hooding over the vocal cords but still with a visible airway, though partial viewing of the true cords was available. Based on this changed finding, it was decided to electively intubate the patient in the OR with plans to continue IV Decadron and planned extubation on  4/29.  PAST MEDICAL HISTORY :  He  has a past medical history of Anxiety, Arthritis, CAD (coronary artery disease), GERD (gastroesophageal reflux disease), Gout, High cholesterol, History of prolonged Q-T interval on ECG, Clostridium difficile infection, umbilical hernia repair, Personal history of digestive disease, Prostate cancer ([place]), Prostate enlargement, Skin cancer, and Thyroid disease.  PAST SURGICAL HISTORY: He  has a past surgical history that includes Back surgery; Nasal sinus surgery; Appendectomy; Prostate biopsy; Cataract extraction, bilateral; right shoulder rotator cuff repair; and Cardiac catheterization (N/A, 02/21/2015).  Allergies  Allergen Reactions  . Lisinopril Swelling    Facial and lip swelling. And throat swelling.   . Benadryl [Diphenhydramine Hcl] Swelling    Facial swelling from high dose (tolerates Advil PM as needed)  . Darvocet [Propoxyphene N-Acetaminophen] Other (See Comments)    hallucination  . Darvon [Propoxyphene Hcl] Other (See Comments)    Hallucination   . Tylenol [Acetaminophen] Swelling    Foot swelling    No current facility-administered medications on file prior to encounter.    Current Outpatient Medications on File Prior to Encounter  Medication Sig  . clobetasol ointment (TEMOVATE) 4.74 % Apply 1 application topically daily as needed (cellulitis on legs).   . clopidogrel (PLAVIX) 75 MG tablet TAKE 1 TABLET(75 MG) BY MOUTH DAILY  . famotidine (PEPCID) 20 MG tablet Take 1 tablet (20 mg total) by mouth 2 (two) times daily as needed for heartburn or indigestion.  [name] levothyroxine (SYNTHROID, LEVOTHROID) 75 MCG tablet Take 75 mcg  by mouth daily before breakfast.  . lisinopril (PRINIVIL,ZESTRIL) 10 MG tablet Take 10 mg by mouth daily.  . metoprolol tartrate (LOPRESSOR) 25 MG tablet Take 1 tablet (25 mg total) by mouth 2 (two) times daily.  . nabumetone (RELAFEN) 750 MG tablet Take 1 tablet by mouth daily.   . pantoprazole (PROTONIX) 40 MG  tablet Take 1 tablet (40 mg total) by mouth daily.  . Pitavastatin Calcium (LIVALO) 4 MG TABS Take 4 mg by mouth at bedtime.   Vladimir Faster Glycol-Propyl Glycol (SYSTANE OP) Place 1 drop into both eyes 2 (two) times daily.  [name] saccharomyces boulardii (FLORASTOR) 250 MG capsule Take 1 capsule (250 mg total) by mouth 2 (two) times daily.  . fluticasone (FLONASE) 50 MCG/ACT nasal spray Place 1 spray into both nostrils daily.  . nitroGLYCERIN (NITROSTAT) 0.4 MG SL tablet DISSOLVE 1 TABLET UNDER TONGUE EVERY 5 MINUTES FOR 3 DOSES AS NEEDED CHEST PAIN. IF NO RELIEF CALL 911  . nystatin (MYCOSTATIN) 100000 UNIT/ML suspension Take 2.5 mLs by mouth at bedtime as needed (thrush). Swish and spit    FAMILY HISTORY:  His indicated that his mother is deceased. He indicated that his father is deceased. He indicated that the status of his sister is unknown. He indicated that his maternal grandmother is deceased. He indicated that his maternal grandfather is deceased. He indicated that his paternal grandmother is deceased. He indicated that his paternal grandfather is deceased.   SOCIAL HISTORY: He  reports that he has never smoked. He has never used smokeless tobacco. He reports that he does not drink alcohol or use drugs.  SUBJECTIVE:  Patient is sedated on propofol  VITAL SIGNS: BP (!) 154/88   Pulse 61   Temp (!) 97.5 F (36.4 C) (Oral)   Resp 14   Ht 6\' 1"  (1.854 m)   Wt 89.2 kg (196 lb 10.4 oz)   SpO2 100%   BMI 25.94 kg/m   VENTILATOR SETTINGS: Vent Mode: PRVC FiO2 (%):  [40 %] 40 % Set Rate:  [14 bmp] 14 bmp Vt Set:  [600 mL] 600 mL PEEP:  [5 cmH20] 5 cmH20 Plateau Pressure:  [15 cmH20-16 cmH20] 15 cmH20  INTAKE / OUTPUT: I/O last 3 completed shifts: In: 4268.7 [I.V.:4088.7; NG/GT:30; IV Piggyback:150] Out: 2800 [Urine:2600; Emesis/NG output:200]  PHYSICAL EXAMINATION: General:  Acutely ill intubated  Neuro:  Following commands moving all extremities  HEENT:  Small pupils but RRL;  ETT in place tongue mildly swollen  Cardiovascular:  Nl S1 and S2. No m/r/g Lungs:  Clear bilaterally Abdomen:  Supple, no guarding, +BS, no organomegaly Musculoskeletal:  No active joints Skin:  No C/C/E  LABS:  BMET Recent Labs  Lab 07/28/17 0720 07/28/17 1332 07/29/17 0458  NA 138  --  138  K 3.9  --  4.0  CL 103  --  106  CO2 25  --  21*  BUN 19  --  23*  CREATININE 1.36* 1.01 1.26*  GLUCOSE 121*  --  171*    Electrolytes Recent Labs  Lab 07/28/17 0720 07/29/17 0458  CALCIUM 9.1 8.7*    CBC Recent Labs  Lab 07/28/17 0720 07/28/17 1332 07/29/17 0458  WBC 8.3 6.5 4.9  HGB 16.3 13.3 13.2  HCT 48.1 39.1 39.0  PLT 168 124* 139*    Coag's No results for input(s): APTT, INR in the last 168 hours.  Sepsis Markers No results for input(s): LATICACIDVEN, PROCALCITON, O2SATVEN in the last 168 hours.  ABG Recent Labs  Lab 07/28/17 1629  PHART 7.481*  PCO2ART 25.8*  PO2ART 98.0    Liver Enzymes Recent Labs  Lab 07/29/17 0458  AST 18  ALT 20  ALKPHOS 54  BILITOT 0.7  ALBUMIN 3.0*    Cardiac Enzymes No results for input(s): TROPONINI, PROBNP in the last 168 hours.  Glucose Recent Labs  Lab 07/28/17 1616 07/29/17 0742  GLUCAP 189* 157*    Imaging No results found.   STUDIES:  Laryngoscopy  CULTURES: None  ANTIBIOTICS: None  SIGNIFICANT EVENTS: Increasing angioedema of the upper airway requiring intubation to protect  LINES/TUBES: ETT Foley Periph IV  DISCUSSION: Angioedema of the upper airway possibly related to lisinopril, although he had been on this med for years and had no recent increase in dosage. ENT plan is for trial extubation tomorrow.  ASSESSMENT / PLAN:  PULMONARY A: Acute respiratory insufficiency requiring mechanical ventilation  angioedema P:   Wean sedation off today Cuff leak positive Trial of extubation today with ENT presence  Restart decadron   CARDIOVASCULAR A:  No issues P:   Telemetry  RENAL A:   Mild azotemia P:   On IVF   GASTROINTESTINAL A:   No acute problem P:   Prophylaxis while on vent  HEMATOLOGIC A:   Nl H&H P:  Trend  ENDOCRINE A:   Hx hypothyroid   P:   Cont Synthroid   NEUROLOGIC A:   Wean sedation  P:   RASS goal: -1     Pulmonary and Critical Care Medicine [facility] Pager: [phone]  07/30/2017, 9:27 AM

## 2017-07-30 NOTE — Anesthesia Preprocedure Evaluation (Signed)
Anesthesia Evaluation  Patient identified by MRN, date of birth, ID band Patient awake    Reviewed: Allergy & Precautions, NPO status , Patient's Chart, lab work & pertinent test results  History of Anesthesia Complications Negative for: history of anesthetic complications  Airway Mallampati: IV  TM Distance: >3 FB Neck ROM: Limited    Dental  (+) Teeth Intact   Pulmonary shortness of breath,  Angioedema    breath sounds clear to auscultation       Cardiovascular hypertension, Pt. on medications and Pt. on home beta blockers + CAD, + Past MI, + Cardiac Stents and +CHF   Rhythm:Regular     Neuro/Psych PSYCHIATRIC DISORDERS Anxiety negative neurological ROS     GI/Hepatic GERD  ,  Endo/Other  diabetesHypothyroidism   Renal/GU      Musculoskeletal  (+) Arthritis ,   Abdominal   Peds  Hematology   Anesthesia Other Findings Lip edema and hoarse voice,   Reproductive/Obstetrics                             Anesthesia Physical  Anesthesia Plan  ASA: III  Anesthesia Plan:    Post-op Pain Management:    Induction:   PONV Risk Score and Plan: Treatment may vary due to age or medical condition  Airway Management Planned: Natural Airway, Nasal Cannula and Simple Face Mask  Additional Equipment:   Intra-op Plan:   Post-operative Plan: Extubation in OR  Informed Consent: I have reviewed the patients History and Physical, chart, labs and discussed the procedure including the risks, benefits and alternatives for the proposed anesthesia with the patient or authorized representative who has indicated his/her understanding and acceptance.   Dental advisory given  Plan Discussed with: CRNA, Surgeon and Anesthesiologist  Anesthesia Plan Comments: (TRIAL EXTUBATION IN OR)        Anesthesia Quick Evaluation

## 2017-07-30 NOTE — Progress Notes (Signed)
ENT Consult Progress Note  Subjective:  Minimal vent settings, remains calm RT to perform cuff leak this morning.  Beginning propofol weaning this morning  Objective: Vitals:   07/30/17 0802 07/30/17 0803  BP:    Pulse:    Resp: 14 14  Temp:    SpO2: 100% 100%    Physical Exam: CONSTITUTIONAL: well developed, nourished, no distress, intubated and sedated CARDIOVASCULAR: normal rate and regular rhythm PULMONARY/CHEST WALL: effort normal and no stridor, no stertor, no dysphonia 7.5 ETT in place with cuff leak around it Vent Mode: PRVC FiO2 (%):  [40 %] 40 % Set Rate:  [14 bmp] 14 bmp Vt Set:  [600 mL] 600 mL PEEP:  [5 cmH20] 5 cmH20 Plateau Pressure:  [15 cmH20-16 cmH20] 15 cmH20  HENT: Head : normocephalic and atraumatic Nose: nose normal and no purulence Mouth/Throat:  Mouth: uvula midline, soft palate edema improving, but not completely resolved Throat: oropharynx clear and moist Mucous membranes: normal EYES: conjunctiva normal, EOM normal and PERRL NECK: supple, trachea normal and no thyromegaly or cervical LAD   Data Review/Consults/Discussions: Discussed with Critical Care Team  CXR report reviewed- IMPRESSION: No acute cardiopulmonary process.  Procedure: Transnasal Flexible Laryngoscopy  Preoperative Diagnosis: angioedema Postoperative Diagnosis: same Procedure: Transnasal fiberoptic laryngoscopy.  Estimated Blood Loss: 0 mL.  Complications: None.  Findings: The nasal cavity and nasopharynx are unremarkable. There are no suspicious findings in the nasopharynx or Fossa of [place]. The tongue base, pharyngeal walls, piriform sinuses, vallecula, epiglottis region are normal in appearance. Cannot fully visualize AE or false folds 2/2 secretions and tube size.   Description of Procedure: With the patient in the sitting position, topical  Afrin-lidocaine mixture in an atomizer was applied to the nose. The scope was passed through the nose. Examination was  carried out of the nose, nasopharynx, oropharynx, hypopharynx, and larynx with findings as noted above. Scope was removed.  The patient tolerated the procedure well.    Assessment: Daniel Herrera 77 y.o. male who presents with lisinopril-related angioedema with significant supraglottic edema requiring emergent awake fiberoptic intubation in the OR.   Plan:  -Remains on famotidine -Completed 2 doses 10mg  IV decadron in last 24 hours -RT demonstrated large cuff leak this morning.  -Continue to wean sedation as tolerated -Will go to the OR at 11:30am for trial extubation.  *please have consent form at bedside.    Thank you for involving [facility] in the care of this patient. Should you need further assistance, please call our office at [phone].    Gavin Pound, MD

## 2017-07-31 LAB — CBC WITH DIFFERENTIAL/PLATELET
Basophils Absolute: 0 10*3/uL (ref 0.0–0.1)
Basophils Relative: 0 %
EOS PCT: 0 %
Eosinophils Absolute: 0 10*3/uL (ref 0.0–0.7)
HCT: 39.7 % (ref 39.0–52.0)
Hemoglobin: 13.3 g/dL (ref 13.0–17.0)
Lymphocytes Relative: 7 %
Lymphs Abs: 0.5 10*3/uL — ABNORMAL LOW (ref 0.7–4.0)
MCH: 32 pg (ref 26.0–34.0)
MCHC: 33.5 g/dL (ref 30.0–36.0)
MCV: 95.7 fL (ref 78.0–100.0)
Monocytes Absolute: 0.6 10*3/uL (ref 0.1–1.0)
Monocytes Relative: 8 %
Neutro Abs: 6.6 10*3/uL (ref 1.7–7.7)
Neutrophils Relative %: 85 %
PLATELETS: 156 10*3/uL (ref 150–400)
RBC: 4.15 MIL/uL — AB (ref 4.22–5.81)
RDW: 12.5 % (ref 11.5–15.5)
WBC: 7.7 10*3/uL (ref 4.0–10.5)

## 2017-07-31 LAB — BASIC METABOLIC PANEL
Anion gap: 8 (ref 5–15)
BUN: 17 mg/dL (ref 6–20)
CHLORIDE: 106 mmol/L (ref 101–111)
CO2: 24 mmol/L (ref 22–32)
Calcium: 9 mg/dL (ref 8.9–10.3)
Creatinine, Ser: 1.14 mg/dL (ref 0.61–1.24)
GFR calc Af Amer: >60 mL/min (ref >60)
GFR, EST NON AFRICAN AMERICAN: 59 mL/min — AB (ref >60)
GLUCOSE: 141 mg/dL — AB (ref 65–99)
POTASSIUM: 4.8 mmol/L (ref 3.5–5.1)
Sodium: 138 mmol/L (ref 135–145)

## 2017-07-31 LAB — TRIGLYCERIDES: Triglycerides: 324 mg/dL — ABNORMAL HIGH (ref <150)

## 2017-07-31 NOTE — Progress Notes (Signed)
PULMONARY / CRITICAL CARE MEDICINE   Name: Daniel Herrera MRN: [id number] DOB: 19-Sep-1940    ADMISSION DATE:  07/28/2017 CONSULTATION DATE:  07/28/2017  REFERRING MD:  Billy Fischer (ED)  Interval Hx: Extubated yesterday doing well sitting on a chair at the side of the bed. No complaints   CHIEF COMPLAINT:  Dyspnea  HISTORY OF PRESENT ILLNESS:   This is a 77 year old with a history of coronary disease and prostate cancer who presents complaining of dyspnea.  He began having some cough for several days followed but by what felt like ulcers in his mouth.  He was seen by primary care physician the day pta who prescribed cough syrup however he had increasing mouth swelling and decided not to take it.  He presented on the AM of admission with an increasing sense of swelling in his mouth and change in his vocalization.  He had not had any difficulties in swallowing his own secretions.       He was examined by ENT who felt that there was some boggy angioedema of the soft palate, the left area retinal fold the lingual surface of the epiglottis.  Should be noted that Daniel Herrera has been taking lisinopril for a very long time.  He has had no change in his dose and he has had no new medicines introduced.  He did not have any associated urticaria or wheezing.  There is no family history of angioedema He underwent repeat laryngoscopy by ENT two hours after their initial examination and found that the soft palate, posterior pharyngeal walls, and epiglottis demonstrated improved edema compared to two hours prior. The supraglottic swelling, however had progressed. There was angioedema of bilateral AE folds and false folds, with slight hooding over the vocal cords but still with a visible airway, though partial viewing of the true cords was available. Based on this changed finding, it was decided to electively intubate the patient in the OR with plans to continue IV Decadron and planned extubation on  4/29.  PAST MEDICAL HISTORY :  He  has a past medical history of Anxiety, Arthritis, CAD (coronary artery disease), GERD (gastroesophageal reflux disease), Gout, High cholesterol, History of prolonged Q-T interval on ECG, Clostridium difficile infection, umbilical hernia repair, Personal history of digestive disease, Prostate cancer ([place]), Prostate enlargement, Skin cancer, and Thyroid disease.  PAST SURGICAL HISTORY: He  has a past surgical history that includes Back surgery; Nasal sinus surgery; Appendectomy; Prostate biopsy; Cataract extraction, bilateral; right shoulder rotator cuff repair; and Cardiac catheterization (N/A, 02/21/2015).  Allergies  Allergen Reactions  . Lisinopril Swelling    Facial and lip swelling. And throat swelling.   . Benadryl [Diphenhydramine Hcl] Swelling    Facial swelling from high dose (tolerates Advil PM as needed)  . Darvocet [Propoxyphene N-Acetaminophen] Other (See Comments)    hallucination  . Darvon [Propoxyphene Hcl] Other (See Comments)    Hallucination   . Tylenol [Acetaminophen] Swelling    Foot swelling    No current facility-administered medications on file prior to encounter.    Current Outpatient Medications on File Prior to Encounter  Medication Sig  . clobetasol ointment (TEMOVATE) 3.55 % Apply 1 application topically daily as needed (cellulitis on legs).   . clopidogrel (PLAVIX) 75 MG tablet TAKE 1 TABLET(75 MG) BY MOUTH DAILY  . famotidine (PEPCID) 20 MG tablet Take 1 tablet (20 mg total) by mouth 2 (two) times daily as needed for heartburn or indigestion.  [name] levothyroxine (SYNTHROID, LEVOTHROID) 75 MCG tablet Take 75  mcg by mouth daily before breakfast.  . lisinopril (PRINIVIL,ZESTRIL) 10 MG tablet Take 10 mg by mouth daily.  . metoprolol tartrate (LOPRESSOR) 25 MG tablet Take 1 tablet (25 mg total) by mouth 2 (two) times daily.  . nabumetone (RELAFEN) 750 MG tablet Take 1 tablet by mouth daily.   . pantoprazole (PROTONIX) 40 MG  tablet Take 1 tablet (40 mg total) by mouth daily.  . Pitavastatin Calcium (LIVALO) 4 MG TABS Take 4 mg by mouth at bedtime.   Vladimir Faster Glycol-Propyl Glycol (SYSTANE OP) Place 1 drop into both eyes 2 (two) times daily.  [name] saccharomyces boulardii (FLORASTOR) 250 MG capsule Take 1 capsule (250 mg total) by mouth 2 (two) times daily.  . fluticasone (FLONASE) 50 MCG/ACT nasal spray Place 1 spray into both nostrils daily.  . nitroGLYCERIN (NITROSTAT) 0.4 MG SL tablet DISSOLVE 1 TABLET UNDER TONGUE EVERY 5 MINUTES FOR 3 DOSES AS NEEDED CHEST PAIN. IF NO RELIEF CALL 911  . nystatin (MYCOSTATIN) 100000 UNIT/ML suspension Take 2.5 mLs by mouth at bedtime as needed (thrush). Swish and spit    FAMILY HISTORY:  His indicated that his mother is deceased. He indicated that his father is deceased. He indicated that the status of his sister is unknown. He indicated that his maternal grandmother is deceased. He indicated that his maternal grandfather is deceased. He indicated that his paternal grandmother is deceased. He indicated that his paternal grandfather is deceased.   SOCIAL HISTORY: He  reports that he has never smoked. He has never used smokeless tobacco. He reports that he does not drink alcohol or use drugs.  SUBJECTIVE:  Patient is sedated on propofol  VITAL SIGNS: BP 108/74   Pulse 61   Temp 97.8 F (36.6 C) (Oral)   Resp 15   Ht 6\' 1"  (1.854 m)   Wt 88.4 kg (194 lb 14.2 oz)   SpO2 100%   BMI 25.71 kg/m   VENTILATOR SETTINGS:    INTAKE / OUTPUT: I/O last 3 completed shifts: In: 3682.2 [P.O.:350; I.V.:3202.2; NG/GT:30; IV Piggyback:100] Out: 1607 [Urine:3925; Emesis/NG output:220]  PHYSICAL EXAMINATION: General:  On [place] alert not in distress  Neuro:  Alert oriented x3 Following commands moving all extremities power normal  HEENT: tongue normal  Cardiovascular:  Nl S1 and S2. No m/r/g Lungs:  Clear bilaterally Abdomen:  Supple, no guarding, +BS, no  organomegaly Musculoskeletal:  No active joints Skin:  No C/C/E  LABS:  BMET Recent Labs  Lab 07/28/17 0720 07/28/17 1332 07/29/17 0458 07/31/17 0221  NA 138  --  138 138  K 3.9  --  4.0 4.8  CL 103  --  106 106  CO2 25  --  21* 24  BUN 19  --  23* 17  CREATININE 1.36* 1.01 1.26* 1.14  GLUCOSE 121*  --  171* 141*    Electrolytes Recent Labs  Lab 07/28/17 0720 07/29/17 0458 07/31/17 0221  CALCIUM 9.1 8.7* 9.0    CBC Recent Labs  Lab 07/28/17 1332 07/29/17 0458 07/31/17 0221  WBC 6.5 4.9 7.7  HGB 13.3 13.2 13.3  HCT 39.1 39.0 39.7  PLT 124* 139* 156    Coag's No results for input(s): APTT, INR in the last 168 hours.  Sepsis Markers No results for input(s): LATICACIDVEN, PROCALCITON, O2SATVEN in the last 168 hours.  ABG Recent Labs  Lab 07/28/17 1629  PHART 7.481*  PCO2ART 25.8*  PO2ART 98.0    Liver Enzymes Recent Labs  Lab 07/29/17 0458  AST  18  ALT 20  ALKPHOS 54  BILITOT 0.7  ALBUMIN 3.0*    Cardiac Enzymes No results for input(s): TROPONINI, PROBNP in the last 168 hours.  Glucose Recent Labs  Lab 07/28/17 1616 07/29/17 0742  GLUCAP 189* 157*    Imaging No results found.   STUDIES:  Laryngoscopy  CULTURES: None  ANTIBIOTICS: None  SIGNIFICANT EVENTS: Increasing angioedema of the upper airway requiring intubation to protect  LINES/TUBES: ETT Foley Periph IV  DISCUSSION: Angioedema of the upper airway possibly related to lisinopril, although he had been on this med for years and had no recent increase in dosage.   ASSESSMENT / PLAN:  PULMONARY A: Acute respiratory insufficiency now off mechanical ventilation  angioedema P:   Doing well  Stable  No complaints  CARDIOVASCULAR A:  No issues P:  Telemetry  RENAL A:   Mild azotemia P:   On IVF   GASTROINTESTINAL A:   No acute problem P:     HEMATOLOGIC A:   Nl H&H P:  Trend  ENDOCRINE A:   Hx hypothyroid   P:   Cont Synthroid    NEUROLOGIC A:   Wean sedation  P:   RASS goal: -1  Patient is stable from critical care standpoint. We appreciate the consult and we will sign off. Please reconsult if needed.      Pulmonary and [place] Pager: [phone]  07/31/2017, 9:32 AM

## 2017-08-01 MED ORDER — MENTHOL 3 MG MT LOZG
1.0000 | LOZENGE | OROMUCOSAL | Status: DC | PRN
  Administered 2017-08-01 – 2017-08-02 (×3): 3 mg via ORAL
  Filled 2017-08-01 (×3): qty 9

## 2017-08-01 MED ORDER — WHITE PETROLATUM EX OINT
TOPICAL_OINTMENT | CUTANEOUS | Status: AC
  Filled 2017-08-01: qty 28.35

## 2017-08-01 NOTE — Progress Notes (Signed)
PULMONARY / CRITICAL CARE MEDICINE   Name: Daniel Herrera MRN: [id number] DOB: 04-Mar-1941    ADMISSION DATE:  07/28/2017 CONSULTATION DATE:  07/28/2017  REFERRING MD:  Billy Fischer (ED)  Interval Hx: Extubated yesterday doing well sitting on a chair at the side of the bed. No complaints   CHIEF COMPLAINT:  Dyspnea  HISTORY OF PRESENT ILLNESS:   This is a 77 year old with a history of coronary disease and prostate cancer who presents complaining of dyspnea.  He began having some cough for several days followed but by what felt like ulcers in his mouth.  He was seen by primary care physician the day pta who prescribed cough syrup however he had increasing mouth swelling and decided not to take it.  He presented on the AM of admission with an increasing sense of swelling in his mouth and change in his vocalization.  He had not had any difficulties in swallowing his own secretions.       He was examined by ENT who felt that there was some boggy angioedema of the soft palate, the left area retinal fold the lingual surface of the epiglottis.  Should be noted that [name] has been taking lisinopril for a very long time.  He has had no change in his dose and he has had no new medicines introduced.  He did not have any associated urticaria or wheezing.  There is no family history of angioedema He underwent repeat laryngoscopy by ENT two hours after their initial examination and found that the soft palate, posterior pharyngeal walls, and epiglottis demonstrated improved edema compared to two hours prior. The supraglottic swelling, however had progressed. There was angioedema of bilateral AE folds and false folds, with slight hooding over the vocal cords but still with a visible airway, though partial viewing of the true cords was available. Based on this changed finding, it was decided to electively intubate the patient in the OR with plans to continue IV Decadron and planned extubation on  4/29.  PAST ME SUBJECTIVE:  Awake alert no acute distress will transfer to floor  VITAL SIGNS: BP 140/90   Pulse 61   Temp 97.7 F (36.5 C) (Oral)   Resp 14   Ht 6\' 1"  (1.854 m)   Wt 89.4 kg (197 lb 1.5 oz)   SpO2 100%   BMI 26.00 kg/m   VENTILATOR SETTINGS:    INTAKE / OUTPUT: I/O last 3 completed shifts: In: 2250 [P.O.:350; I.V.:1750; IV Piggyback:150] Out: 4000 [Urine:4000]  PHYSICAL EXAMINATION: General: Well-nourished well-developed male no acute distress HEENT: MM pink/moist Neuro: Intact CV: s1s2 rrr, no m/r/g PULM: even/non-labored, lungs bilaterally clear, mild hoarseness XV:QMGQ, non-tender, bsx4 active  Extremities: warm/dry, negative edema  Skin: no rashes or lesions   LABS:  BMET Recent Labs  Lab 07/28/17 0720 07/28/17 1332 07/29/17 0458 07/31/17 0221  NA 138  --  138 138  K 3.9  --  4.0 4.8  CL 103  --  106 106  CO2 25  --  21* 24  BUN 19  --  23* 17  CREATININE 1.36* 1.01 1.26* 1.14  GLUCOSE 121*  --  171* 141*    Electrolytes Recent Labs  Lab 07/28/17 0720 07/29/17 0458 07/31/17 0221  CALCIUM 9.1 8.7* 9.0    CBC Recent Labs  Lab 07/28/17 1332 07/29/17 0458 07/31/17 0221  WBC 6.5 4.9 7.7  HGB 13.3 13.2 13.3  HCT 39.1 39.0 39.7  PLT 124* 139* 156    Coag's No results  for input(s): APTT, INR in the last 168 hours.  Sepsis Markers No results for input(s): LATICACIDVEN, PROCALCITON, O2SATVEN in the last 168 hours.  ABG Recent Labs  Lab 07/28/17 1629  PHART 7.481*  PCO2ART 25.8*  PO2ART 98.0    Liver Enzymes Recent Labs  Lab 07/29/17 0458  AST 18  ALT 20  ALKPHOS 54  BILITOT 0.7  ALBUMIN 3.0*    Cardiac Enzymes No results for input(s): TROPONINI, PROBNP in the last 168 hours.  Glucose Recent Labs  Lab 07/28/17 1616 07/29/17 0742  GLUCAP 189* 157*    Imaging No results found.   STUDIES:  Laryngoscopy  CULTURES: None  ANTIBIOTICS: None  SIGNIFICANT EVENTS: Increasing angioedema of  the upper airway requiring intubation to protect 08/01/2017 transfer to floor  LINES/TUBES: ETT out Foley Periph IV  DISCUSSION: Angioedema of the upper airway possibly related to lisinopril, although he had been on this med for years and had no recent increase in dosage.  08/01/2017 hemodynamically stable no airway issues to be transferred to the floor  ASSESSMENT / PLAN:  PULMONARY A: Acute respiratory insufficiency now off mechanical ventilation  angioedema P:   No acute distress Stable  CARDIOVASCULAR A:  No issues P:  No issues  RENAL A:   Mild azotemia P:   IV fluids as needed  GASTROINTESTINAL A:   No acute problem P:   GI protection  HEMATOLOGIC A:   Nl H&H P:  Trend  ENDOCRINE A:   Hx hypothyroid   P:    Synthroid  NEUROLOGIC A:   Neurologic intact no need for sedation P:   0  08/01/2017 patient is stable.  We will move him to the floor and triad will pick him up on 08/02/2017    [facility] Jaquaya Coyle ACNP Maryanna Shape PCCM Pager [phone] till 1 pm If no answer page [phone] 08/01/2017, 12:44 PM

## 2017-08-01 NOTE — Op Note (Addendum)
I was present for Anesthesia performing an awake transoral flexible intubation. Dr. Ermalene Postin. I did not perform the procedure but was present for assistance and on standby for possible trach, which was not needed.   Helayne Seminole, MD

## 2017-08-01 NOTE — Evaluation (Signed)
Physical Therapy Evaluation Patient Details Name: Daniel Herrera MRN: [id number] DOB: 08-01-1940 Today's Date: 08/01/2017   History of Present Illness  Pt is a 77 y.o. male admitted 07/28/17 with increased mouth swelling. ENT felt that there was some boggy angioedema of the soft palate,the left area retinal fold the lingual surface of the epiglottis. Pt intubated 4/27-29. PMH includes CAD, gout, arthritis, prostate CA, thyroid disease.     Clinical Impression  Pt presents with an overall decrease in functional mobility secondary to above. PTA, pt indep and lives with wife; enjoys being active with yardwork. Today, pt required min guard-minA for multiple sit<>stands and short amb distance with RW. Pt with decreased activity tolerance and easily fatigued. C/o "haziness" upon standing, which subsided while marching in place (see BP values below). HR up to 130s while standing. Discussed recommendation for SNF vs. HHPT services upon d/c; pt motivated for SNF-level therapies to maximize functional mobility and independence prior to return home. Will follow acutely to address established goals.   Supine BP 139/89 Sitting BP 151/90 Standing BP 118/93 Standing 3-min (post-marching) BP 133/77    Follow Up Recommendations SNF;Supervision for mobility/OOB    Equipment Recommendations  Rolling walker with 5" wheels    Recommendations for Other Services       Precautions / Restrictions Precautions Precautions: Fall Restrictions Weight Bearing Restrictions: No      Mobility  Bed Mobility Overal bed mobility: Needs Assistance Bed Mobility: Supine to Sit     Supine to sit: Min guard     General bed mobility comments: Increased time and effort; no physical assist required  Transfers Overall transfer level: Needs assistance Equipment used: Rolling walker (2 wheeled) Transfers: Sit to/from Stand Sit to Stand: Min assist;Min guard         General transfer comment: Performed  sit<>stand 3x with initial minA to assist trunk elevation, progressing to min guard; repeated cues each trial for correct hand placement. Performed ~60 sec marching due to drop in BP upon standing; pt asymptomatic. Easily fatigued requiring 2x seated rest break with prolonged standing  Ambulation/Gait Ambulation/Gait assistance: Min guard Ambulation Distance (Feet): 5 Feet Assistive device: Rolling walker (2 wheeled) Gait Pattern/deviations: Step-to pattern;Leaning posteriorly Gait velocity: Decreased   General Gait Details: Slow, slightly unsteady amb to recliner with RW and min guard for balance. Pt declining further amb distance secondary to fatigue  Stairs            Wheelchair Mobility    Modified Rankin (Stroke Patients Only)       Balance Overall balance assessment: Needs assistance   Sitting balance-Leahy Scale: Good       Standing balance-Leahy Scale: Poor Standing balance comment: Reliant on UE support                             Pertinent Vitals/Pain Pain Assessment: Faces Faces Pain Scale: Hurts little more Pain Location: Throat, head Pain Descriptors / Indicators: Sore;Headache Pain Intervention(s): Monitored during session    Home Living Family/patient expects to be discharged to:: Private residence Living Arrangements: Spouse/significant other Available Help at Discharge: Family;Available 24 hours/day Type of Home: House Home Access: Stairs to enter Entrance Stairs-Rails: Right Entrance Stairs-Number of Steps: 2 Home Layout: Two level;Laundry or work area in basement;Able to live on main level with [employer]: None      Prior Function Level of Independence: Independent         Comments: Enjoys  being active with yardwork     Hand Dominance        Extremity/Trunk Assessment   Upper Extremity Assessment Upper Extremity Assessment: Overall WFL for tasks assessed    Lower Extremity Assessment Lower  Extremity Assessment: Generalized weakness    Cervical / Trunk Assessment Cervical / Trunk Assessment: Kyphotic  Communication   Communication: No difficulties  Cognition Arousal/Alertness: Awake/alert Behavior During Therapy: WFL for tasks assessed/performed Overall Cognitive Status: No family/caregiver present to determine baseline cognitive functioning Area of Impairment: Problem solving                             Problem Solving: Requires verbal cues        General Comments General comments (skin integrity, edema, etc.): SpO2 >90% on RA, HR up to 131 standing    Exercises     Assessment/Plan    PT Assessment Patient needs continued PT services  PT Problem List Decreased strength;Decreased activity tolerance;Decreased balance;Decreased mobility;Decreased knowledge of use of DME;Cardiopulmonary status limiting activity       PT Treatment Interventions DME instruction;Gait training;Stair training;Functional mobility training;Therapeutic activities;Therapeutic exercise;Balance training;Patient/family education    PT Goals (Current goals can be found in the Care Plan section)  Acute Rehab PT Goals Patient Stated Goal: Get stronger at SNF prior to return home PT Goal Formulation: With patient Time For Goal Achievement: 08/15/17 Potential to Achieve Goals: Good    Frequency Min 2X/week   Barriers to discharge        Co-evaluation               AM-PAC PT "6 Clicks" Daily Activity  Outcome Measure Difficulty turning over in bed (including adjusting bedclothes, sheets and blankets)?: A Little Difficulty moving from lying on back to sitting on the side of the bed? : A Little Difficulty sitting down on and standing up from a chair with arms (e.g., wheelchair, bedside commode, etc,.)?: Unable Help needed moving to and from a bed to chair (including a wheelchair)?: A Little Help needed walking in hospital room?: A Little Help needed climbing 3-5 steps  with a railing? : A Lot 6 Click Score: 15    End of Session Equipment Utilized During Treatment: Gait belt Activity Tolerance: Patient tolerated treatment well;Patient limited by fatigue Patient left: in chair;with call bell/phone within reach Nurse Communication: Mobility status PT Visit Diagnosis: Other abnormalities of gait and mobility (R26.89)    Time: 0737-1062 PT Time Calculation (min) (ACUTE ONLY): 35 min   Charges:   PT Evaluation $PT Eval Moderate Complexity: 1 Mod PT Treatments $Therapeutic Activity: 8-22 mins   PT G Codes:       Mabeline Caras, PT, DPT Acute Rehab Services  Pager: [phone] 08/01/2017, 5:29 PM

## 2017-08-02 ENCOUNTER — Inpatient Hospital Stay (HOSPITAL_COMMUNITY): Payer: Medicare Other

## 2017-08-02 DIAGNOSIS — I2510 Atherosclerotic heart disease of native coronary artery without angina pectoris: Secondary | ICD-10-CM

## 2017-08-02 DIAGNOSIS — E039 Hypothyroidism, unspecified: Secondary | ICD-10-CM

## 2017-08-02 DIAGNOSIS — I10 Essential (primary) hypertension: Secondary | ICD-10-CM

## 2017-08-02 LAB — BASIC METABOLIC PANEL
ANION GAP: 8 (ref 5–15)
BUN: 16 mg/dL (ref 6–20)
CO2: 28 mmol/L (ref 22–32)
Calcium: 9 mg/dL (ref 8.9–10.3)
Chloride: 103 mmol/L (ref 101–111)
Creatinine, Ser: 1.26 mg/dL — ABNORMAL HIGH (ref 0.61–1.24)
GFR calc Af Amer: >60 mL/min (ref >60)
GFR, EST NON AFRICAN AMERICAN: 52 mL/min — AB (ref >60)
GLUCOSE: 130 mg/dL — AB (ref 65–99)
POTASSIUM: 4.1 mmol/L (ref 3.5–5.1)
Sodium: 139 mmol/L (ref 135–145)

## 2017-08-02 LAB — PHOSPHORUS: Phosphorus: 3.9 mg/dL (ref 2.5–4.6)

## 2017-08-02 LAB — MAGNESIUM: Magnesium: 1.9 mg/dL (ref 1.7–2.4)

## 2017-08-02 MED ORDER — METOPROLOL TARTRATE 12.5 MG HALF TABLET
12.5000 mg | ORAL_TABLET | Freq: Two times a day (BID) | ORAL | Status: DC
  Administered 2017-08-02 – 2017-08-03 (×3): 12.5 mg via ORAL
  Filled 2017-08-02 (×3): qty 1

## 2017-08-02 NOTE — Care Management Note (Signed)
Case Management Note Marvetta Gibbons RN, BSN Unit 4E-Case Manager-- [place] coverage [phone]  Patient Details  Name: GEO SLONE MRN: [id number] Date of Birth: 1940-07-28  Subjective/Objective:  Pt admitted with angioedema- intubated on admit- ext. 4/29                  Action/Plan: PTA pt lived at home- PT eval pending- CM to follow for recommendations   Expected Discharge Date:                  Expected Discharge Plan:  Skilled Nursing Facility  In-House Referral:  Clinical Social Work  Discharge planning Services  CM Consult  Post Acute Care Choice:    Choice offered to:     DME Arranged:    DME Agency:     HH Arranged:    [place] Agency:     Status of Service:  In process, will continue to follow  If discussed at Long Length of Stay Meetings, dates discussed:  5/2  Discharge Disposition:   Additional Comments:  08/02/17- 1030- Oiva Dibari RN, CM- per PT eval recommendation for SNF- CSW to see pt for possible placement- spoke with 2W CM and updated on pt (need to watch for transition to orals and tolerating diet for transition readiness per [facility] mtg discussion).   Dawayne Patricia, RN 08/02/2017, 10:29 AM

## 2017-08-02 NOTE — Clinical Social Work Note (Signed)
Clinical Social Work Assessment  Patient Details  Name: Daniel Herrera MRN: [id number] Date of Birth: Sep 12, 1940  Date of referral:  08/02/17               Reason for consult:  Facility Placement                Permission sought to share information with:  Chartered certified accountant granted to share information::  Yes, Verbal Permission Granted  Name::     Merchant navy officer::  SNF  Relationship::  dtr  Contact Information:     Housing/Transportation Living arrangements for the past 2 months:  [place] of Information:  Patient Patient Interpreter Needed:  None Criminal Activity/Legal Involvement Pertinent to Current Situation/Hospitalization:  No - Comment as needed Significant Relationships:  Adult Children Lives with:    Do you feel safe going back to the place where you live?  No Need for family participation in patient care:  No (Coment)  Care giving concerns:  Pt lives at home without assistance- currently weak after acute hospital stay unsure if he can manage at home.   Social Worker assessment / plan:  CSW spoke with pt concerning PT recommendation for SNF.  Explained SNF and SNF referral process.  Employment status:  Retired Forensic scientist:  Medicare PT Recommendations:  [place] / Referral to community resources:  [place]  Patient/Family's Response to care:  Pt agreeable to CSW looking into SNF but is interested in home services if possible.  States sister will be living with him for awhile and can provide physical assistance.  Patient/Family's Understanding of and Emotional Response to Diagnosis, Current Treatment, and Prognosis:  Pt very pleasant and optimistic regarding recovery.  Emotional Assessment Appearance:  Appears stated age Attitude/Demeanor/Rapport:    Affect (typically observed):  Pleasant, Appropriate Orientation:  Oriented to Self, Oriented to Place, Oriented  to  Time, Oriented to Situation Alcohol / Substance use:  Not Applicable Psych involvement (Current and /or in the community):  No (Comment)  Discharge Needs  Concerns to be addressed:  Care Coordination Readmission within the last 30 days:  No Current discharge risk:  Physical Impairment Barriers to Discharge:  Continued Medical Work up   Jorge Ny, LCSW 08/02/2017, 4:04 PM

## 2017-08-02 NOTE — [place] FL2 (Signed)
[place] MEDICAID FL2 LEVEL OF CARE SCREENING TOOL     IDENTIFICATION  Patient Name: Daniel Herrera Birthdate: [birth year] Sex: male Admission Date (Current Location): 07/28/2017  [facility] and [place] Number:  Herbalist and Address:  [facility], [place] [address], [place], [place] [address]      Provider Number: [id number]  Attending Physician Name and Address:  Desiree Hane, MD  Relative Name and Phone Number:       Current Level of Care: Hospital Recommended Level of Care: [place] Prior Approval Number:    Date Approved/Denied:   PASRR Number: [id number]  Discharge Plan: SNF    Current Diagnoses: Patient Active Problem List   Diagnosis Date Noted  . Acute respiratory insufficiency   . Angioedema 07/28/2017  . Enterotoxigenic Escherichia coli infection 05/24/2015  . STEC (Shiga toxin-producing Escherichia coli) infection 05/24/2015  . Colitis 05/23/2015  . Prostate cancer ([place])   . Chronic diarrhea of unknown origin   . Chronic systolic CHF (congestive heart failure) ([place])   . CAD in native artery   . Type 2 diabetes mellitus with unspecified complications ([place])   . Coronary artery disease 04/08/2015  . Chest pain 04/08/2015  . Dyspnea 04/08/2015  . Palpitations 03/15/2015  . Non-STEMI (non-ST elevated myocardial infarction) ([place]) 02/22/2015  . NSTEMI (non-ST elevated myocardial infarction) ([place]) 02/21/2015  . Abnormal EKG 02/21/2015  . Essential hypertension 02/21/2015  . Hyperlipidemia 02/21/2015  . Malignant neoplasm of prostate ([place]) 08/07/2014  . Acute interstitial pneumonitis ([place]) 12/17/2013    Orientation RESPIRATION BLADDER Height & Weight     Self, Situation, Time, Place  Normal Continent Weight: 188 lb 4.4 oz (85.4 kg) Height:  6' 1" (185.4 cm)  BEHAVIORAL SYMPTOMS/MOOD NEUROLOGICAL BOWEL NUTRITION STATUS      Continent Diet(see DC summary)  AMBULATORY STATUS COMMUNICATION OF NEEDS Skin    Limited Assist Verbally Normal                       Personal Care Assistance Level of Assistance  Bathing, Dressing Bathing Assistance: Limited assistance   Dressing Assistance: Limited assistance     Functional Limitations Info             SPECIAL CARE FACTORS FREQUENCY  PT (By licensed PT), OT (By licensed OT)     PT Frequency: 5/wk OT Frequency: 5/wk            Contractures      Additional Factors Info  Code Status, Allergies Code Status Info: FULL Allergies Info: Lisinopril, Benadryl Diphenhydramine Hcl, Darvocet Propoxyphene N-acetaminophen, Darvon Propoxyphene Hcl, Tylenol Acetaminophen           Current Medications (08/02/2017):  This is the current hospital active medication list Current Facility-Administered Medications  Medication Dose Route Frequency Provider Last Rate Last Dose  . 0.9 %  sodium chloride infusion  250 mL Intravenous PRN Helayne Seminole, MD      . amLODipine (NORVASC) tablet 5 mg  5 mg Oral Daily Helayne Seminole, MD   5 mg at 08/02/17 0843  . chlorhexidine gluconate (MEDLINE KIT) (PERIDEX) 0.12 % solution 15 mL  15 mL Mouth Rinse BID Helayne Seminole, MD   15 mL at 08/02/17 0848  . clopidogrel (PLAVIX) tablet 75 mg  75 mg Per Tube Daily Helayne Seminole, MD   75 mg at 08/02/17 0843  . enoxaparin (LOVENOX) injection 40 mg  40 mg Subcutaneous Q24H Marcellino,  San Jetty, MD   40 mg at 08/01/17 1453  . fluticasone (FLONASE) 50 MCG/ACT nasal spray 1 spray  1 spray Each Nare Daily Helayne Seminole, MD   1 spray at 08/02/17 [phone]  . labetalol (NORMODYNE,TRANDATE) injection 5 mg  5 mg Intravenous Q2H PRN Helayne Seminole, MD      . levothyroxine (SYNTHROID, LEVOTHROID) tablet 75 mcg  75 mcg Per Tube QAC breakfast Helayne Seminole, MD   75 mcg at 08/02/17 0530  . menthol-cetylpyridinium (CEPACOL) lozenge 3 mg  1 lozenge Oral PRN Fuller Plan A, MD   3 mg at 08/02/17 0450  . metoprolol tartrate (LOPRESSOR)  injection 5 mg  5 mg Intravenous Q6H Marcellino, San Jetty, MD   5 mg at 08/02/17 0450  . Racepinephrine HCl 2.25 % nebulizer solution 0.5 mL  0.5 mL Nebulization Q4H PRN Marcellino, San Jetty, MD         Discharge Medications: Please see discharge summary for a list of discharge medications.  Relevant Imaging Results:  Relevant Lab Results:   Additional Information SS#: [id number]  Jorge Ny, LCSW

## 2017-08-02 NOTE — Care Management Important Message (Signed)
Important Message  Patient Details  Name: Daniel Herrera MRN: [id number] Date of Birth: Jul 11, 1940   Medicare Important Message Given:  Yes    Orbie Pyo 08/02/2017, 1:27 PM

## 2017-08-02 NOTE — Progress Notes (Signed)
PROGRESS NOTE  Daniel Herrera YNW:[id number] DOB: 08-24-1940 DOA: 07/28/2017 PCP: [name], NP  HPI  Daniel Herrera is a 77 y.o. year old male with medical history significant for CAD, prostate cancer who presented on 07/28/2017 with mild swelling difficulty swallowing,  and was found to have angioedema suspect related to lisinopril. No hives or urticaria on presentation. Given IV pepci, IV decadron 10 mg x3on initial presentation before undergoing elective intubation on 4/27-->extubated 4/29. Monitored in ICU and transferred to floor on 5/2  Interval History Transferred out of ICU to regular floor overnight after extubation. No acute events overnight   ROS: no SOB or CP. No dsyphagia    Subjective Ready to eat regular food. Some sore throat no dyspnea  Assessment/Plan: Principal Problem:   Angioedema Active Problems:   Essential hypertension   CAD in native artery   Acute respiratory insufficiency   #Angioedema, possibly related to chronic lisinopril, resolved Elective intubation on 4/27 for airway protection, tolerated extubation on 4/29 with improvement in posterior pharyngeal swelling on ENT evaluation. Remained stable since. Clear liquid diet, advance to regular diet  #Hypothyroidism, stable.  Continue Synthroid  # CAD. No chest pain. Continue plavix  #HTN. Controlled. Continue amlodipine 5 mg, IV labetalol PRN, holding home lisinopril in light of angioedema. Home metoprolol ( currently on IV) will resume as expect able to tolerate oral meds now   #GERD, stable. Resume home protonix  Code Status: FULL    Family Communication: Family updated at bedside  Disposition Plan: PT recommends SNF. Family able to provide 24 h assistance   Consultants:  ENT, Critical Care  Procedures: Intubated 4/27 Flexible Laryngoscopy with extubation 4/29- mild posterior pharyngeal edema(improved from prior)    Antimicrobials:  none    Cultures:  none  Telemetry:No  DVT prophylaxis:  Lovenox   Objective: Vitals:   08/01/17 2319 08/02/17 0530 08/02/17 0734 08/02/17 0843  BP: 112/72  95/66 (!) 142/71  Pulse: 75  76   Resp: 20  20   Temp: 98.7 F (37.1 C)  98.1 F (36.7 C)   TempSrc: Oral     SpO2: 96%  99%   Weight:  85.4 kg (188 lb 4.4 oz)    Height:        Intake/Output Summary (Last 24 hours) at 08/02/2017 1049 Last data filed at 08/02/2017 0525 Gross per 24 hour  Intake 518 ml  Output 1040 ml  Net -522 ml   Filed Weights   07/31/17 0500 08/01/17 0500 08/02/17 0530  Weight: 88.4 kg (194 lb 14.2 oz) 89.4 kg (197 lb 1.5 oz) 85.4 kg (188 lb 4.4 oz)    Exam:  Constitutional:normal appearing pleasant elderly male Eyes: EOMI, anicteric, normal conjunctivae ENMT: Oropharynx with moist mucous membranes, normal dentition, no visualized swelling Cardiovascular: RRR no MRGs, with no peripheral edema Respiratory: Normal respiratory effort on room air, clear breath sounds  Skin: No rash or ulcers. Without skin tenting  Neurologic: Grossly no focal neuro deficit. Psychiatric:Appropriate affect, and mood. Mental status AAOx3  Data Reviewed: CBC: Recent Labs  Lab 07/28/17 0720 07/28/17 1332 07/29/17 0458 07/31/17 0221  WBC 8.3 6.5 4.9 7.7  NEUTROABS  --   --   --  6.6  HGB 16.3 13.3 13.2 13.3  HCT 48.1 39.1 39.0 39.7  MCV 96.4 96.1 94.7 95.7  PLT 168 124* 139* 657   Basic Metabolic Panel: Recent Labs  Lab 07/28/17 0720 07/28/17 1332 07/29/17 0458 07/31/17 0221 08/02/17 0205  NA 138  --  138 138 139  K 3.9  --  4.0 4.8 4.1  CL 103  --  106 106 103  CO2 25  --  21* 24 28  GLUCOSE 121*  --  171* 141* 130*  BUN 19  --  23* 17 16  CREATININE 1.36* 1.01 1.26* 1.14 1.26*  CALCIUM 9.1  --  8.7* 9.0 9.0  MG  --   --   --   --  1.9  PHOS  --   --   --   --  3.9   GFR: Estimated Creatinine Clearance: 53.7 mL/min (A) (by C-G formula based on SCr of 1.26 mg/dL (H)). Liver Function  Tests: Recent Labs  Lab 07/29/17 0458  AST 18  ALT 20  ALKPHOS 54  BILITOT 0.7  PROT 5.1*  ALBUMIN 3.0*   No results for input(s): LIPASE, AMYLASE in the last 168 hours. No results for input(s): AMMONIA in the last 168 hours. Coagulation Profile: No results for input(s): INR, PROTIME in the last 168 hours. Cardiac Enzymes: No results for input(s): CKTOTAL, CKMB, CKMBINDEX, TROPONINI in the last 168 hours. BNP (last 3 results) No results for input(s): PROBNP in the last 8760 hours. HbA1C: No results for input(s): HGBA1C in the last 72 hours. CBG: Recent Labs  Lab 07/28/17 1616 07/29/17 0742  GLUCAP 189* 157*   Lipid Profile: Recent Labs    07/31/17 1529  TRIG 324*   Thyroid Function Tests: No results for input(s): TSH, T4TOTAL, FREET4, T3FREE, THYROIDAB in the last 72 hours. Anemia Panel: No results for input(s): VITAMINB12, FOLATE, FERRITIN, TIBC, IRON, RETICCTPCT in the last 72 hours. Urine analysis:    Component Value Date/Time   COLORURINE YELLOW 07/28/2017 1402   APPEARANCEUR CLEAR 07/28/2017 1402   LABSPEC 1.013 07/28/2017 1402   PHURINE 5.0 07/28/2017 1402   GLUCOSEU NEGATIVE 07/28/2017 1402   HGBUR NEGATIVE 07/28/2017 1402   BILIRUBINUR NEGATIVE 07/28/2017 1402   KETONESUR 5 (A) 07/28/2017 1402   PROTEINUR NEGATIVE 07/28/2017 1402   UROBILINOGEN 0.2 10/27/2012 2246   NITRITE NEGATIVE 07/28/2017 1402   LEUKOCYTESUR NEGATIVE 07/28/2017 1402   Sepsis Labs: '@LABRCNTIP' (procalcitonin:4,lacticidven:4)  ) Recent Results (from the past 240 hour(s))  MRSA PCR Screening     Status: None   Collection Time: 07/28/17  2:03 PM  Result Value Ref Range Status   MRSA by PCR NEGATIVE NEGATIVE Final    Comment:        The GeneXpert MRSA Assay (FDA approved for NASAL specimens only), is one component of a comprehensive MRSA colonization surveillance program. It is not intended to diagnose MRSA infection nor to guide or monitor treatment for MRSA  infections. Performed at [facility], [place] [address], [place], [place] [address]       Studies: Dg Chest Port 1 View  Result Date: 08/02/2017 CLINICAL DATA:  Respiratory distress EXAM: PORTABLE CHEST 1 VIEW COMPARISON:  07/28/2017 FINDINGS: Large lung volumes. Stable biapical pleural based scar like appearance. Mild reticulation at the bases, chronic especially when compared to 2016 chest x-ray. There is no edema, consolidation, effusion, or pneumothorax. Normal heart size and mediastinal contours. Coronary stenting. IMPRESSION: No evidence of active disease when compared to priors. Electronically Signed   By: Monte Fantasia M.D.   On: 08/02/2017 09:10    Scheduled Meds: . amLODipine  5 mg Oral Daily  . chlorhexidine gluconate (MEDLINE KIT)  15 mL Mouth Rinse BID  . clopidogrel  75 mg Per Tube Daily  . enoxaparin (LOVENOX) injection  40 mg Subcutaneous Q24H  . fluticasone  1 spray Each Nare Daily  . levothyroxine  75 mcg Per Tube QAC breakfast  . metoprolol tartrate  5 mg Intravenous Q6H    Continuous Infusions: . sodium chloride       LOS: 5 days     Desiree Hane, MD [facility] Pager [phone]  If 7PM-7AM, please contact night-coverage [email or web address] Password [facility] 08/02/2017, 10:49 AM

## 2017-08-02 NOTE — Progress Notes (Deleted)
RT called to assess patient.  Patient satting 86-89 on 8L Venturi mask, RR in the 30's , fine crackles heard on auscultation.  RT turned up Venti Mask to 12L 50% Patient still satting 89-91.  RT then placed Patient on 15L Non-rebreather.  Patients Sats steady between 92-94% with an increased WOB. Lasix have been ordered by MD.  RT will continue to monitor.

## 2017-08-02 NOTE — Care Management Note (Signed)
Case Management Note  Patient Details  Name: Daniel Herrera MRN: [id number] Date of Birth: 1940/10/06  Subjective/Objective:   NCM spoke with patient, NCM was informed by CSW and MD that patient wants to go home with [facility].  NCM offered choice to patient from the [employer], he chose [facility] for HHPT, he states he does not want a Therapist, sports , or OT, he just wants HHPT.  Referral given to [facility] with [facility] for HH[facility].  Soc will begin 24-48 hrs post dc.                  Action/Plan: DC home with HHPT with [facility] when ready, will need HHPT orders with face to face when dc.  Expected Discharge Date:                  Expected Discharge Plan:  [place]  In-House Referral:  Clinical Social Work  Discharge planning Services  CM Consult  Post Acute Care Choice:  Home Health Choice offered to:  Patient  DME Arranged:    DME Agency:     HH Arranged:  PT HH Agency:  [facility]  Status of Service:  Completed, signed off  If discussed at [place] of Stay Meetings, dates discussed:    Additional Comments:  Zenon Mayo, RN 08/02/2017, 3:36 PM

## 2017-08-03 DIAGNOSIS — Z01818 Encounter for other preprocedural examination: Secondary | ICD-10-CM

## 2017-08-03 MED ORDER — AMLODIPINE BESYLATE 5 MG PO TABS: 5.0000 mg | ORAL_TABLET | Freq: Every day | ORAL | 0 refills | Status: DC

## 2017-08-03 NOTE — Discharge Summary (Signed)
Discharge Summary  Daniel Herrera FHL:[id number] DOB: 01/25/1941  PCP: [name], NP  Admit date: 07/28/2017 Discharge date:    Time spent: < 25 minutes  Admitted From: Home Disposition:  Home  Recommendations for Outpatient Follow-up:  1. Follow up with PCP in 1-2 weeks.  2. Discontinue further Lisinopril to avoid angioedema in future 3. Added amlodipine for HTN control    Home Health:HHPT Equipment/Devices:Rolling walker  Discharge Diagnoses:  Active Hospital Problems   Diagnosis Date Noted  . Angioedema 07/28/2017  . Acute respiratory insufficiency   . CAD in native artery   . Essential hypertension 02/21/2015    Resolved Hospital Problems  No resolved problems to display.    Discharge Condition: Stable CODE STATUS:FULL Diet recommendation: Heart Healthy    Vitals:   08/03/17 0815 08/03/17 1122  BP: (!) 123/95 139/89  Pulse: (!) 105 87  Resp: 20   Temp: 98.8 F (37.1 C)   SpO2: 97%     History of present illness:  Daniel Herrera is a 77 y.o. year old male with medical history significant for, prostate cancer who presented on 07/28/2017 with mild swelling difficulty swallowing,  and was found to have angioedema suspect related to lisinopril. Remaining hospital course addressed in problem based format below:   Hospital Course:  Principal Problem:   Angioedema Active Problems:   Essential hypertension   CAD in native artery   Acute respiratory insufficiency  1.  Angioedema.  Resolved suspect most likely related to chronic lisinopril use.  Lisinopril was discontinued during hospitalization.  Patient was monitored during elective intubation for airway protection given significant posterior pharyngeal swelling on ENT evaluation.  After extubation patient was monitored over 24 hours with no worsening swelling.  Patient to continue off of lisinopril on discharge.  Able to tolerate regular diet prior to leaving hospital with resolution of symptoms  2.   Hypertension.  Discontinue lisinopril.  Continue amlodipine and metoprolol on discharge.   Antibiotics: None  Microbiology: None  Consultations:  ENT, critical care   Procedures/Studies: Intubated 4/27  Flexible Laryngoscopy with extubation 4/29- mild posterior pharyngeal edema(improved from prior)   Dg Chest Port 1 View  Result Date: 08/02/2017 CLINICAL DATA:  Respiratory distress EXAM: PORTABLE CHEST 1 VIEW COMPARISON:  07/28/2017 FINDINGS: Large lung volumes. Stable biapical pleural based scar like appearance. Mild reticulation at the bases, chronic especially when compared to 2016 chest x-ray. There is no edema, consolidation, effusion, or pneumothorax. Normal heart size and mediastinal contours. Coronary stenting. IMPRESSION: No evidence of active disease when compared to priors. Electronically Signed   By: Monte Fantasia M.D.   On: 08/02/2017 09:10   Dg Chest Port 1 View  Result Date: 07/28/2017 CLINICAL DATA:  Status post intubation EXAM: PORTABLE CHEST 1 VIEW COMPARISON:  Chest radiograph 05/01/2017 FINDINGS: ET tube terminates in the mid trachea. Normal cardiac and mediastinal contours. No consolidative pulmonary opacities. No pleural effusion or pneumothorax. Bibasilar scarring. Right greater than left apical pleuroparenchymal thickening. IMPRESSION: No acute cardiopulmonary process. Electronically Signed   By: Lovey Newcomer M.D.   On: 07/28/2017 16:17   Dg Abd Portable 1v  Result Date: 07/28/2017 CLINICAL DATA:  NG tube placement. EXAM: PORTABLE ABDOMEN - 1 VIEW COMPARISON:  05/22/2015 FINDINGS: Nasogastric tube passes below the diaphragm. Tip projects in the expected location of the distal stomach or first portion of the duodenum. IMPRESSION: Well-positioned nasogastric tube. Electronically Signed   By: Lajean Manes M.D.   On: 07/28/2017 16:39  Discharge Exam: BP 139/89   Pulse 87   Temp 98.8 F (37.1 C) (Oral)   Resp 20   Ht 6\' 1"  (1.854 m)   Wt 85.6 kg (188 lb  11.2 oz)   SpO2 97%   BMI 24.90 kg/m   General: Lying in bed, no apparent distress Eyes: EOMI, anicteric ENT: Oral Mucosa clear and moist, no visualized swelling in oropharynx Cardiovascular: regular rate and rhythm, no murmurs, rubs or gallops, Peripheral Pulses Present, no edema, no JVD Respiratory: Normal respiratory effort on room air, lungs clear to auscultation bilaterally Abdomen: soft, non-distended, non-tender, normal bowel sounds Skin: No Rash Musculoskeletal:Good ROM, no contractures. Normal muscle tone Neurologic: Grossly no focal neuro deficit.Mental status AAOx3, speech normal, Psychiatric:Appropriate affect, and mood   Discharge Instructions You were cared for by a hospitalist during your hospital stay. If you have any questions about your discharge medications or the care you received while you were in the hospital after you are discharged, you can call the unit and asked to speak with the hospitalist on call if the hospitalist that took care of you is not available. Once you are discharged, your primary care physician will handle any further medical issues. Please note that NO REFILLS for any discharge medications will be authorized once you are discharged, as it is imperative that you return to your primary care physician (or establish a relationship with a primary care physician if you do not have one) for your aftercare needs so that they can reassess your need for medications and monitor your lab values.  Discharge Instructions    Diet - low sodium heart healthy   Complete by:  As directed    Increase activity slowly   Complete by:  As directed      Allergies as of 08/03/2017      Reactions   Lisinopril Swelling   Facial and lip swelling. And throat swelling.    Benadryl [diphenhydramine Hcl] Swelling   Facial swelling from high dose (tolerates Advil PM as needed)   Darvocet [propoxyphene N-acetaminophen] Other (See Comments)   hallucination   Darvon  [propoxyphene Hcl] Other (See Comments)   Hallucination   Tylenol [acetaminophen] Swelling   Foot swelling      Medication List    STOP taking these medications   lisinopril 10 MG tablet Commonly known as:  PRINIVIL,ZESTRIL     TAKE these medications   amLODipine 5 MG tablet Commonly known as:  NORVASC Take 1 tablet (5 mg total) by mouth daily. Start taking on:  08/04/2017   clobetasol ointment 0.05 % Commonly known as:  TEMOVATE Apply 1 application topically daily as needed (cellulitis on legs).   clopidogrel 75 MG tablet Commonly known as:  PLAVIX TAKE 1 TABLET(75 MG) BY MOUTH DAILY   famotidine 20 MG tablet Commonly known as:  PEPCID Take 1 tablet (20 mg total) by mouth 2 (two) times daily as needed for heartburn or indigestion.   fluticasone 50 MCG/ACT nasal spray Commonly known as:  FLONASE Place 1 spray into both nostrils daily.   levothyroxine 75 MCG tablet Commonly known as:  SYNTHROID, LEVOTHROID Take 75 mcg by mouth daily before breakfast.   LIVALO 4 MG Tabs Generic drug:  Pitavastatin Calcium Take 4 mg by mouth at bedtime.   metoprolol tartrate 25 MG tablet Commonly known as:  LOPRESSOR Take 1 tablet (25 mg total) by mouth 2 (two) times daily.   nabumetone 750 MG tablet Commonly known as:  RELAFEN Take 1 tablet by  mouth daily.   nitroGLYCERIN 0.4 MG SL tablet Commonly known as:  NITROSTAT DISSOLVE 1 TABLET UNDER TONGUE EVERY 5 MINUTES FOR 3 DOSES AS NEEDED CHEST PAIN. IF NO RELIEF CALL 911   nystatin 100000 UNIT/ML suspension Commonly known as:  MYCOSTATIN Take 2.5 mLs by mouth at bedtime as needed (thrush). Swish and spit   pantoprazole 40 MG tablet Commonly known as:  PROTONIX Take 1 tablet (40 mg total) by mouth daily.   saccharomyces boulardii 250 MG capsule Commonly known as:  FLORASTOR Take 1 capsule (250 mg total) by mouth 2 (two) times daily.   SYSTANE OP Place 1 drop into both eyes 2 (two) times daily.      Allergies  Allergen  Reactions  . Lisinopril Swelling    Facial and lip swelling. And throat swelling.   . Benadryl [Diphenhydramine Hcl] Swelling    Facial swelling from high dose (tolerates Advil PM as needed)  . Darvocet [Propoxyphene N-Acetaminophen] Other (See Comments)    hallucination  . Darvon [Propoxyphene Hcl] Other (See Comments)    Hallucination   . Tylenol [Acetaminophen] Swelling    Foot swelling      The results of significant diagnostics from this hospitalization (including imaging, microbiology, ancillary and laboratory) are listed below for reference.    Significant Diagnostic Studies: Dg Chest Port 1 View  Result Date: 08/02/2017 CLINICAL DATA:  Respiratory distress EXAM: PORTABLE CHEST 1 VIEW COMPARISON:  07/28/2017 FINDINGS: Large lung volumes. Stable biapical pleural based scar like appearance. Mild reticulation at the bases, chronic especially when compared to 2016 chest x-ray. There is no edema, consolidation, effusion, or pneumothorax. Normal heart size and mediastinal contours. Coronary stenting. IMPRESSION: No evidence of active disease when compared to priors. Electronically Signed   By: Monte Fantasia M.D.   On: 08/02/2017 09:10   Dg Chest Port 1 View  Result Date: 07/28/2017 CLINICAL DATA:  Status post intubation EXAM: PORTABLE CHEST 1 VIEW COMPARISON:  Chest radiograph 05/01/2017 FINDINGS: ET tube terminates in the mid trachea. Normal cardiac and mediastinal contours. No consolidative pulmonary opacities. No pleural effusion or pneumothorax. Bibasilar scarring. Right greater than left apical pleuroparenchymal thickening. IMPRESSION: No acute cardiopulmonary process. Electronically Signed   By: Lovey Newcomer M.D.   On: 07/28/2017 16:17   Dg Abd Portable 1v  Result Date: 07/28/2017 CLINICAL DATA:  NG tube placement. EXAM: PORTABLE ABDOMEN - 1 VIEW COMPARISON:  05/22/2015 FINDINGS: Nasogastric tube passes below the diaphragm. Tip projects in the expected location of the distal  stomach or first portion of the duodenum. IMPRESSION: Well-positioned nasogastric tube. Electronically Signed   By: Lajean Manes M.D.   On: 07/28/2017 16:39    Microbiology: Recent Results (from the past 240 hour(s))  MRSA PCR Screening     Status: None   Collection Time: 07/28/17  2:03 PM  Result Value Ref Range Status   MRSA by PCR NEGATIVE NEGATIVE Final    Comment:        The GeneXpert MRSA Assay (FDA approved for NASAL specimens only), is one component of a comprehensive MRSA colonization surveillance program. It is not intended to diagnose MRSA infection nor to guide or monitor treatment for MRSA infections. Performed at [facility], [place] [address], [place], [place] [address]      Labs: Basic Metabolic Panel: Recent Labs  Lab 07/28/17 0720 07/28/17 1332 07/29/17 0458 07/31/17 0221 08/02/17 0205  NA 138  --  138 138 139  K 3.9  --  4.0 4.8 4.1  CL 103  --  106 106 103  CO2 25  --  21* 24 28  GLUCOSE 121*  --  171* 141* 130*  BUN 19  --  23* 17 16  CREATININE 1.36* 1.01 1.26* 1.14 1.26*  CALCIUM 9.1  --  8.7* 9.0 9.0  MG  --   --   --   --  1.9  PHOS  --   --   --   --  3.9   Liver Function Tests: Recent Labs  Lab 07/29/17 0458  AST 18  ALT 20  ALKPHOS 54  BILITOT 0.7  PROT 5.1*  ALBUMIN 3.0*   No results for input(s): LIPASE, AMYLASE in the last 168 hours. No results for input(s): AMMONIA in the last 168 hours. CBC: Recent Labs  Lab 07/28/17 0720 07/28/17 1332 07/29/17 0458 07/31/17 0221  WBC 8.3 6.5 4.9 7.7  NEUTROABS  --   --   --  6.6  HGB 16.3 13.3 13.2 13.3  HCT 48.1 39.1 39.0 39.7  MCV 96.4 96.1 94.7 95.7  PLT 168 124* 139* 156   Cardiac Enzymes: No results for input(s): CKTOTAL, CKMB, CKMBINDEX, TROPONINI in the last 168 hours. BNP: BNP (last 3 results) No results for input(s): BNP in the last 8760 hours.  ProBNP (last 3 results) No results for input(s): PROBNP in the last 8760 hours.  CBG: Recent Labs  Lab  07/28/17 1616 07/29/17 0742  GLUCAP 189* 157*       Signed:  Desiree Hane, MD [facility] 08/03/2017, 11:38 AM

## 2017-08-03 NOTE — Progress Notes (Addendum)
Physical Therapy Treatment & Discharge Patient Details Name: Daniel Herrera MRN: [id number] DOB: [birth year] Today's Date: 08/03/2017    History of Present Illness Pt is a 77 y.o. male admitted 07/28/17 with increased mouth swelling. ENT felt that there was some boggy angioedema of the soft palate,the left area retinal fold the lingual surface of the epiglottis. Pt intubated 4/27-29. PMH includes CAD, gout, arthritis, prostate CA, thyroid disease.    PT Comments    Pt has progressed very well with mobility. Amb 600' mod indep with RW; can amb throughout room with intermittent UE support and supervision for safety. Will have 24/7 assist available at home. Pt has met short-term acute PT goals. Recommend HHPT services to maximize functional mobility and independence. Will d/c acute PT.    Follow Up Recommendations  Home health PT;Supervision - Intermittent     Equipment Recommendations  Rolling walker with 5" wheels    Recommendations for Other Services       Precautions / Restrictions Precautions Precautions: Fall Restrictions Weight Bearing Restrictions: No    Mobility  Bed Mobility Overal bed mobility: Independent                Transfers Overall transfer level: Modified independent Equipment used: Rolling walker (2 wheeled) Transfers: Sit to/from Stand              Ambulation/Gait Ambulation/Gait assistance: Modified independent (Device/Increase time) Ambulation Distance (Feet): 600 Feet Assistive device: Rolling walker (2 wheeled) Gait Pattern/deviations: Step-through pattern;Decreased stride length Gait velocity: Decreased       Stairs Stairs: (Pt reports confidence ascending 2 steps into home)           Wheelchair Mobility    Modified Rankin (Stroke Patients Only)       Balance Overall balance assessment: Needs assistance   Sitting balance-Leahy Scale: Good       Standing balance-Leahy Scale: Fair Standing balance comment: Can  amb short distance with no DME; stability improved with RW                            Cognition Arousal/Alertness: Awake/alert Behavior During Therapy: WFL for tasks assessed/performed Overall Cognitive Status: Within Functional Limits for tasks assessed                                        Exercises      General Comments        Pertinent Vitals/Pain Pain Assessment: No/denies pain    Home Living                      Prior Function            PT Goals (current goals can now be found in the care plan section) Progress towards PT goals: Goals met/education completed, patient discharged from PT    Frequency    Min 3X/week      PT Plan Discharge plan needs to be updated;Frequency needs to be updated    Co-evaluation              AM-PAC PT "6 Clicks" Daily Activity  Outcome Measure  Difficulty turning over in bed (including adjusting bedclothes, sheets and blankets)?: None Difficulty moving from lying on back to sitting on the side of the bed? : None Difficulty sitting down on and standing up from a chair  with arms (e.g., wheelchair, bedside commode, etc,.)?: None Help needed moving to and from a bed to chair (including a wheelchair)?: A Little Help needed walking in hospital room?: A Little Help needed climbing 3-5 steps with a railing? : A Little 6 Click Score: 21    End of Session Equipment Utilized During Treatment: Gait belt Activity Tolerance: Patient tolerated treatment well Patient left: in chair;with call bell/phone within reach Nurse Communication: Mobility status PT Visit Diagnosis: Other abnormalities of gait and mobility (R26.89)     Time: 6387-5643 PT Time Calculation (min) (ACUTE ONLY): 22 min  Charges:  $Gait Training: 8-22 mins                    G Codes:      Mabeline Caras, PT, DPT Acute Rehab Services  Pager: [place] 08/03/2017, 1:11 PM

## 2017-08-03 NOTE — Consult Note (Signed)
            [facility] CM Primary Care Navigator  08/03/2017  ELIGAH ANELLO 1940-06-14 [id number]    Went to seepatient at the bedside to identify possible discharge needsbutshe was already dischargedperRNreport. Patient was discharged home today with home health services.  Per MD note, patientpresented with complaint of lips swelling with sensation of tongue and throat swelling. Patient was admitted for angioedema (suspect related to Lisinopril).  Primary care provider's officeis listed as providingtransition of care (TOC)follow-up.   Patient has discharge instruction to follow-up withprimary care provider in 1- 2 weeks after discharge.                                                            For additional questions please contact:  Edwena Felty A. Mollee Neer, BSN, RN-BC [facility] PRIMARY CARE Navigator Cell: [phone]

## 2017-08-07 ENCOUNTER — Ambulatory Visit: Payer: Medicare Other | Admitting: Gastroenterology

## 2017-08-09 DIAGNOSIS — I10 Essential (primary) hypertension: Secondary | ICD-10-CM | POA: Diagnosis not present

## 2017-08-09 DIAGNOSIS — Z79899 Other long term (current) drug therapy: Secondary | ICD-10-CM | POA: Diagnosis not present

## 2017-08-09 DIAGNOSIS — E538 Deficiency of other specified B group vitamins: Secondary | ICD-10-CM | POA: Diagnosis not present

## 2017-08-09 DIAGNOSIS — T783XXA Angioneurotic edema, initial encounter: Secondary | ICD-10-CM | POA: Diagnosis not present

## 2017-08-17 DIAGNOSIS — J309 Allergic rhinitis, unspecified: Secondary | ICD-10-CM | POA: Diagnosis not present

## 2017-08-17 DIAGNOSIS — R21 Rash and other nonspecific skin eruption: Secondary | ICD-10-CM | POA: Diagnosis not present

## 2017-08-31 ENCOUNTER — Ambulatory Visit: Payer: Medicare Other | Admitting: Gastroenterology

## 2017-09-12 ENCOUNTER — Encounter: Payer: Self-pay | Admitting: Allergy and Immunology

## 2017-09-12 ENCOUNTER — Encounter

## 2017-09-12 ENCOUNTER — Ambulatory Visit (INDEPENDENT_AMBULATORY_CARE_PROVIDER_SITE_OTHER): Payer: Medicare Other | Admitting: Allergy and Immunology

## 2017-09-12 VITALS — BP 142/80 | HR 63 | Temp 97.7°F | Resp 20 | Ht 70.0 in | Wt 201.6 lb

## 2017-09-12 DIAGNOSIS — D72810 Lymphocytopenia: Secondary | ICD-10-CM | POA: Diagnosis not present

## 2017-09-12 DIAGNOSIS — J849 Interstitial pulmonary disease, unspecified: Secondary | ICD-10-CM | POA: Diagnosis not present

## 2017-09-12 DIAGNOSIS — J3089 Other allergic rhinitis: Secondary | ICD-10-CM

## 2017-09-12 DIAGNOSIS — T783XXA Angioneurotic edema, initial encounter: Secondary | ICD-10-CM

## 2017-09-12 DIAGNOSIS — K219 Gastro-esophageal reflux disease without esophagitis: Secondary | ICD-10-CM | POA: Diagnosis not present

## 2017-09-12 DIAGNOSIS — L500 Allergic urticaria: Secondary | ICD-10-CM

## 2017-09-12 MED ORDER — FAMOTIDINE 20 MG PO TABS: 40.0000 mg | ORAL_TABLET | Freq: Every day | ORAL | 5 refills | Status: DC

## 2017-09-12 MED ORDER — AZELASTINE HCL 0.1 % NA SOLN: 1.0000 | Freq: Two times a day (BID) | NASAL | 5 refills | Status: DC

## 2017-09-12 MED ORDER — PANTOPRAZOLE SODIUM 40 MG PO TBEC: 40.0000 mg | DELAYED_RELEASE_TABLET | Freq: Two times a day (BID) | ORAL | 5 refills | Status: DC

## 2017-09-12 MED ORDER — MONTELUKAST SODIUM 10 MG PO TABS: 10.0000 mg | ORAL_TABLET | Freq: Every day | ORAL | 5 refills | Status: DC

## 2017-09-12 MED ORDER — AZELASTINE HCL 0.1 % NA SOLN: 2.0000 | Freq: Two times a day (BID) | NASAL | 5 refills | Status: DC

## 2017-09-12 NOTE — Progress Notes (Signed)
Dear Daniel Herrera,  Thank you for referring Daniel Herrera to the [facility] on 09/12/2017.   Below is a summation of this patient's evaluation and recommendations.  Thank you for your referral. I will keep you informed about this patient's response to treatment.   If you have any questions please do not hesitate to contact me.   Sincerely,  Jiles Prows, MD Allergy / Immunology [place]   ______________________________________________________________________    NEW PATIENT NOTE  Referring Provider: [name], NP Primary Provider: [name], NP Date of office visit: 09/12/2017    Subjective:   Chief Complaint:  Daniel Herrera (DOB: October 20, 1940) is a 77 y.o. male who presents to the clinic on 09/12/2017 with a chief complaint of Urticaria and Allergic Reaction .     HPI: Daniel Herrera presents to this clinic in evaluation of several distinct issues.  First, he has developed a rather significant oral facial swelling episode requiring intubation this spring while utilizing lisinopril.  As well, about 6 years ago while having dental work performed he developed swelling of his mouth for which he was given a shot of Benadryl and then developed very significant swelling of his oral facial cavity requiring hospitalization again for several days.  He also has a very long history of developing hand swelling and foot swelling for which he apparently went to [facility] at around the age of 27 in investigation of this issue but no diagnosis was ever provided for this problem except for possible pressure induced angioedema..  It does appear that he resolved his hand swelling and foot swelling around the age of 24.  Second, he has chronic raspy voice and coughing that has been significantly worse since his intubation this spring.  He wakes up in the morning and he has a cough and gagging.  He feels as though  there is constant drainage in his throat.  He has seen an ENT doctor about 2 years ago who apparently looked at his throat and told him that everything was okay.  He does have gastroesophageal reflux disease and is presently using a combination of a proton pump inhibitor and an H2 receptor blocker.  He has seen Dr. Lyndel Safe for an upper endoscopy.  He does consume green tea all day.  Third, he has a "tongue coating and soreness in his mouth" all the time.  He uses nystatin swish and spit on a daily basis.  Fourth, he has nasal congestion and sneezing for which he uses nasal fluticasone on a daily basis which does help this issue.  He does not have any anosmia or ugly nasal discharge or recurrent sinus infections.  There is not really an obvious provoking factor giving rise to this issue.  Fifth, he sees Dr. Elsworth Soho for what sounds like interstitial lung disease manifested as coughing.  He has been given inhalers in the past which have never helped him.  Odors appear to precipitate his coughing episodes and he feels as though he is choking when he gets around very strong odors.  Sixth, he had hives about 3 weeks ago requiring the administration of systemic steroids with no associated systemic or constitutional symptoms.  His lesions did not heal with scar or hyperpigmentation and resolved within a few days.  There was no obvious provoking factor giving rise to this issue.  Past Medical History:  Diagnosis Date  . Angio-edema   .  Anxiety   . Arthritis   . CAD (coronary artery disease)    a. 02/2015: DES to mid-LAD  . GERD (gastroesophageal reflux disease)   . Gout   . High cholesterol   . History of prolonged Q-T interval on ECG   . Hx of Clostridium difficile infection   . Hx of umbilical hernia repair   . Personal history of digestive disease    gastric ulcer  . Prostate cancer ([place])   . Prostate enlargement   . Skin cancer    basal cell carcinoma  . Thyroid disease   . Urticaria     Past  Surgical History:  Procedure Laterality Date  . APPENDECTOMY    . BACK SURGERY    . CARDIAC CATHETERIZATION N/A 02/21/2015   Procedure: Left Heart Cath;  Surgeon: Lorretta Harp, MD;  Location: [facility];  Service: Cardiovascular;  Laterality: N/A;  . CATARACT EXTRACTION, BILATERAL    . NASAL SINUS SURGERY    . PROSTATE BIOPSY    . right shoulder rotator cuff repair      Allergies as of 09/12/2017      Reactions   Lisinopril Swelling   Facial and lip swelling. And throat swelling.    Benadryl [diphenhydramine Hcl] Swelling   Facial swelling from high dose (tolerates Advil PM as needed)   Darvocet [propoxyphene N-acetaminophen] Other (See Comments)   hallucination   Darvon [propoxyphene Hcl] Other (See Comments)   Hallucination   Tylenol [acetaminophen] Swelling   Foot swelling      Medication List      amLODipine 5 MG tablet Commonly known as:  NORVASC Take 1 tablet (5 mg total) by mouth daily.   clobetasol ointment 0.05 % Commonly known as:  TEMOVATE Apply 1 application topically daily as needed (cellulitis on legs).   clopidogrel 75 MG tablet Commonly known as:  PLAVIX TAKE 1 TABLET(75 MG) BY MOUTH DAILY   EPINEPHrine 0.3 mg/0.3 mL Soaj injection Commonly known as:  EPI-PEN INJECT INTRAMUSCULARLY AS DIRECTED   famotidine 20 MG tablet Commonly known as:  PEPCID Take 1 tablet (20 mg total) by mouth 2 (two) times daily as needed for heartburn or indigestion.   fluticasone 50 MCG/ACT nasal spray Commonly known as:  FLONASE Place 1 spray into both nostrils daily.   levothyroxine 75 MCG tablet Commonly known as:  SYNTHROID, LEVOTHROID Take 75 mcg by mouth daily before breakfast.   LIVALO 4 MG Tabs Generic drug:  Pitavastatin Calcium Take 4 mg by mouth at bedtime.   metoprolol tartrate 25 MG tablet Commonly known as:  LOPRESSOR Take 1 tablet (25 mg total) by mouth 2 (two) times daily.   nabumetone 750 MG tablet Commonly known as:  RELAFEN Take 1  tablet by mouth daily.   nitroGLYCERIN 0.4 MG SL tablet Commonly known as:  NITROSTAT DISSOLVE 1 TABLET UNDER TONGUE EVERY 5 MINUTES FOR 3 DOSES AS NEEDED CHEST PAIN. IF NO RELIEF CALL 911   nystatin 100000 UNIT/ML suspension Commonly known as:  MYCOSTATIN Take 2.5 mLs by mouth at bedtime as needed (thrush). Swish and spit   pantoprazole 40 MG tablet Commonly known as:  PROTONIX Take 1 tablet (40 mg total) by mouth daily.   saccharomyces boulardii 250 MG capsule Commonly known as:  FLORASTOR Take 1 capsule (250 mg total) by mouth 2 (two) times daily.   SYSTANE OP Place 1 drop into both eyes 2 (two) times daily.        Review of systems negative  except as noted in HPI / PMHx or noted below:  Review of Systems  Constitutional: Negative.   HENT: Negative.   Eyes: Negative.   Respiratory: Negative.   Cardiovascular: Negative.   Gastrointestinal: Negative.   Genitourinary: Negative.   Musculoskeletal: Negative.   Skin: Negative.   Neurological: Negative.   Endo/Heme/Allergies: Negative.   Psychiatric/Behavioral: Negative.     Family History  Problem Relation Age of Onset  . Cancer Mother   . Cancer Father        prostate  . Cancer Sister     Social History   Socioeconomic History  . Marital status: Married    Spouse name: Not on file  . Number of children: Not on file  . Years of education: Not on file  . Highest education level: Not on file  Occupational History  . Occupation: retired  Scientific laboratory technician  . Financial resource strain: Not on file  . Food insecurity:    Worry: Not on file    Inability: Not on file  . Transportation needs:    Medical: Not on file    Non-medical: Not on file  Tobacco Use  . Smoking status: Never Smoker  . Smokeless tobacco: Never Used  Substance and Sexual Activity  . Alcohol use: No  . Drug use: No  . Sexual activity: Not on file  Lifestyle  . Physical activity:    Days per week: Not on file    Minutes per session: Not  on file  . Stress: Not on file  Relationships  . Social connections:    Talks on phone: Not on file    Gets together: Not on file    Attends religious service: Not on file    Active member of club or organization: Not on file    Attends meetings of clubs or organizations: Not on file    Relationship status: Not on file  . Intimate partner violence:    Fear of current or ex partner: Not on file    Emotionally abused: Not on file    Physically abused: Not on file    Forced sexual activity: Not on file  Other Topics Concern  . Not on file  Social History Narrative  . Not on file    Environmental and Social history  Lives in a house with a dry environment, no animals located inside the household, carpet in the bedroom, plastic on the bed, plastic on the pillow, and no smokers located inside the household.  Objective:   Vitals:   09/12/17 0853  BP: (!) 142/80  Pulse: 63  Resp: 20  Temp: 97.7 F (36.5 C)  SpO2: 96%   Height: 5\' 10"  (177.8 cm) Weight: 201 lb 9.6 oz (91.4 kg)  Physical Exam  Constitutional:  Very raspy voice, throat clearing  HENT:  Head: Normocephalic. Head is without right periorbital erythema and without left periorbital erythema.  Right Ear: Tympanic membrane, external ear and ear canal normal.  Left Ear: Tympanic membrane, external ear and ear canal normal.  Nose: Nose normal. No mucosal edema or rhinorrhea.  Mouth/Throat: Oropharynx is clear and moist and mucous membranes are normal. No oropharyngeal exudate.  Eyes: Pupils are equal, round, and reactive to light. Conjunctivae and lids are normal.  Neck: Trachea normal. No tracheal deviation present. No thyromegaly present.  Cardiovascular: Normal rate, regular rhythm, S1 normal, S2 normal and normal heart sounds.  No murmur heard. Pulmonary/Chest: Effort normal. No stridor. No respiratory distress. He has no wheezes. He  has no rales. He exhibits no tenderness.  Abdominal: Soft. He exhibits no  distension and no mass. There is no hepatosplenomegaly. There is no tenderness. There is no rebound and no guarding.  Musculoskeletal: He exhibits no edema or tenderness.  Lymphadenopathy:       Head (right side): No tonsillar adenopathy present.       Head (left side): No tonsillar adenopathy present.    He has no cervical adenopathy.    He has no axillary adenopathy.  Neurological: He is alert.  Skin: Rash (Multiple ecchymotic areas forearms and upper arm) noted. He is not diaphoretic. No erythema. No pallor. Nails show no clubbing.    Diagnostics: Allergy skin tests were performed.  He demonstrated hypersensitivity against house dust mite.  Spirometry was performed and demonstrated an FEV1 of 2.95 @ 114 % of predicted. FEV1/FVC = 0.87  Results of blood tests obtained 29 July 2017 identified normal hepatic and renal function, total protein 5.1 g/DL ,WBC 4.9, Absolute eosinophils 0, absolute lymphocyte 500, hemoglobin 13.2, platelet 139   Assessment and Plan:    1. Angioedema, initial encounter   2. Allergic urticaria   3. Other allergic rhinitis   4. LPRD (laryngopharyngeal reflux disease)   5. ILD (interstitial lung disease) ([place])   6. Lymphopenia     1.  Allergen avoidance measures  2.  Treat and prevent inflammation:   A.  Flonase - 1 spray each nostril twice a day  B.  Azelastine - 1 spray each nostril twice a day  C.  Montelukast 10 mg - 1 tablet 1 time per day  3.  Treat and prevent reflux:   A.  Slowly eliminate all caffeine consumption  B.  Increase pantoprazole 40 mg twice a day  C.  Increase famotidine 40 mg in evening  4.  If needed:   A.  Nasal saline spray  B.  OTC antihistamine - loratadine 10 mg 1 time per day  C.  OTC Mucinex DM - 2 tablets twice a day  D.  EpiPen  5.  Blood -C1 esterase inhibitor level and function, C4, C1q, CBC w/diff, SPEP with Immunofixation, ANA w/reflex  6.  Return to clinic in 4 weeks or earlier if problem  Daniel Herrera has had  several episodes of angioedema throughout his life and although his most recent episode was probably tied up with ACE inhibitor use we will obtain studies looking for a complement abnormality that may account for his recurrent episodes.  As well, he does have lymphopenia detected this spring and we will follow that up with the blood tests noted above.  He had an isolated episode of urticaria within the past month with unknown etiologic factor.  This did not appear to be atopic in nature based upon his skin test results today.  I have given him some anti-inflammatory agents for his respiratory tract as noted above and his history is very consistent with LPR for which we will treat him aggressively for reflux as noted above.  His ILD is being followed by Dr. Elsworth Soho and does not require any further evaluation or treatment at this point.  I will see him back in this clinic in 4 weeks or earlier if there is a problem.  Jiles Prows, MD Allergy / Immunology [facility]

## 2017-09-12 NOTE — Patient Instructions (Addendum)
  1.  Allergen avoidance measures  2.  Treat and prevent inflammation:   A.  Flonase - 1 spray each nostril twice a day  B.  Azelastine - 1 spray each nostril twice a day  C.  Montelukast 10 mg - 1 tablet 1 time per day  3.  Treat and prevent reflux:   A.  Slowly eliminate all caffeine consumption  B.  Increase pantoprazole 40 mg twice a day  C.  Increase famotidine 40 mg in evening  4.  If needed:   A.  Nasal saline spray  B.  OTC antihistamine - loratadine 10 mg 1 time per day  C.  OTC Mucinex DM - 2 tablets twice a day  D.  EpiPen  5.  Blood -C1 esterase inhibitor level and function, C4, C1q, CBC w/diff, SPEP with Immunofixation, ANA w/reflex  6.  Return to clinic in 4 weeks or earlier if problem

## 2017-09-13 ENCOUNTER — Encounter: Payer: Self-pay | Admitting: Allergy and Immunology

## 2017-09-17 ENCOUNTER — Other Ambulatory Visit: Payer: Self-pay | Admitting: *Deleted

## 2017-09-17 DIAGNOSIS — D841 Defects in the complement system: Secondary | ICD-10-CM

## 2017-09-18 DIAGNOSIS — T783XXA Angioneurotic edema, initial encounter: Secondary | ICD-10-CM | POA: Diagnosis not present

## 2017-09-18 LAB — PROTEIN ELECTROPHORESIS, SERUM, WITH REFLEX
A/G RATIO SPE: 1.6 (ref 0.7–1.7)
ALBUMIN ELP: 3.9 g/dL (ref 2.9–4.4)
ALPHA 2: 0.8 g/dL (ref 0.4–1.0)
Alpha 1: 0.3 g/dL (ref 0.0–0.4)
BETA: 1 g/dL (ref 0.7–1.3)
GAMMA GLOBULIN: 0.4 g/dL (ref 0.4–1.8)
Globulin, Total: 2.5 g/dL (ref 2.2–3.9)
TOTAL PROTEIN: 6.4 g/dL (ref 6.0–8.5)

## 2017-09-18 LAB — COMPLEMENT COMPONENT C1Q: COMPLEMENT C1Q: 17.3 mg/dL (ref 11.8–23.8)

## 2017-09-18 LAB — CBC WITH DIFFERENTIAL/PLATELET
BASOS ABS: 0 10*3/uL (ref 0.0–0.2)
Basos: 0 %
EOS (ABSOLUTE): 0.1 10*3/uL (ref 0.0–0.4)
EOS: 2 %
HEMATOCRIT: 46.3 % (ref 37.5–51.0)
HEMOGLOBIN: 15.4 g/dL (ref 13.0–17.7)
IMMATURE GRANS (ABS): 0 10*3/uL (ref 0.0–0.1)
Immature Granulocytes: 0 %
LYMPHS ABS: 1 10*3/uL (ref 0.7–3.1)
LYMPHS: 20 %
MCH: 31.8 pg (ref 26.6–33.0)
MCHC: 33.3 g/dL (ref 31.5–35.7)
MCV: 96 fL (ref 79–97)
MONOCYTES: 10 %
Monocytes Absolute: 0.5 10*3/uL (ref 0.1–0.9)
NEUTROS ABS: 3.5 10*3/uL (ref 1.4–7.0)
Neutrophils: 68 %
Platelets: 172 10*3/uL (ref 150–450)
RBC: 4.85 x10E6/uL (ref 4.14–5.80)
RDW: 14 % (ref 12.3–15.4)
WBC: 5.2 10*3/uL (ref 3.4–10.8)

## 2017-09-18 LAB — C1 ESTERASE INHIBITOR: C1INH SerPl-mCnc: 79 mg/dL — ABNORMAL HIGH (ref 21–39)

## 2017-09-18 LAB — C1 ESTERASE INHIBITOR, FUNCTIONAL: C1 ESTERASE INH FUNCT: 26 %{normal} — AB

## 2017-09-18 LAB — ANA W/REFLEX: Anti Nuclear Antibody(ANA): NEGATIVE

## 2017-09-18 LAB — C4 COMPLEMENT: Complement C4, Serum: 11 mg/dL — ABNORMAL LOW (ref 14–44)

## 2017-09-18 NOTE — Addendum Note (Signed)
Addended by: Orlene Erm on: 09/18/2017 09:31 AM   Modules accepted: Orders

## 2017-09-21 LAB — C4 COMPLEMENT: COMPLEMENT C4, SERUM: 11 mg/dL — AB (ref 14–44)

## 2017-09-21 LAB — C1 ESTERASE INHIBITOR: C1INH SerPl-mCnc: 73 mg/dL — ABNORMAL HIGH (ref 21–39)

## 2017-09-21 LAB — C1 ESTERASE INHIBITOR, FUNCTIONAL: C1INH Functional/C1INH Total MFr SerPl: 16 %mean normal — ABNORMAL LOW

## 2017-09-21 NOTE — Addendum Note (Signed)
Addended by: Herbie Drape on: 09/21/2017 04:51 PM   Modules accepted: Orders

## 2017-09-24 NOTE — Addendum Note (Signed)
Addended by: Lucrezia Starch I on: 09/24/2017 07:36 AM   Modules accepted: Orders

## 2017-09-26 ENCOUNTER — Encounter: Payer: Self-pay | Admitting: *Deleted

## 2017-10-16 ENCOUNTER — Encounter: Payer: Self-pay | Admitting: Allergy and Immunology

## 2017-10-16 ENCOUNTER — Ambulatory Visit (INDEPENDENT_AMBULATORY_CARE_PROVIDER_SITE_OTHER): Payer: Medicare Other | Admitting: Allergy and Immunology

## 2017-10-16 VITALS — BP 148/86 | HR 80 | Resp 20

## 2017-10-16 DIAGNOSIS — L500 Allergic urticaria: Secondary | ICD-10-CM | POA: Diagnosis not present

## 2017-10-16 DIAGNOSIS — J849 Interstitial pulmonary disease, unspecified: Secondary | ICD-10-CM | POA: Diagnosis not present

## 2017-10-16 DIAGNOSIS — D841 Defects in the complement system: Secondary | ICD-10-CM

## 2017-10-16 DIAGNOSIS — J3089 Other allergic rhinitis: Secondary | ICD-10-CM

## 2017-10-16 DIAGNOSIS — K219 Gastro-esophageal reflux disease without esophagitis: Secondary | ICD-10-CM | POA: Diagnosis not present

## 2017-10-16 NOTE — Patient Instructions (Addendum)
  1.  Allergen avoidance measures - Dust Mite  2.  Continue to Treat and prevent inflammation:    A.  Flonase - 1 spray each nostril twice a day  B.  Azelastine - 1 spray each nostril twice a day  3.  Continue to Treat and prevent reflux:   A.  Slowly eliminate all caffeine consumption  B.  pantoprazole 40 mg twice a day  C.  famotidine 40 mg in evening  4.  If needed:   A.  Nasal saline spray  B.  OTC antihistamine - loratadine 10 mg 1 time per day  C.  OTC Mucinex DM - 2 tablets twice a day  D.  Firazyr injection for swelling reaction  5. Return to clinic in 8 weeks or earlier if problem  6. Obtain fall flu vaccine

## 2017-10-16 NOTE — Progress Notes (Signed)
Follow-up Note  Referring Provider: [name], NP Primary Provider: [name], NP Date of Office Visit: 10/16/2017  Subjective:   Daniel Herrera (DOB: Aug 09, 1940) is a 77 y.o. male who returns to the [facility] on 10/16/2017 in re-evaluation of the following:  HPI: Daniel Herrera returns to this clinic in reevaluation of his angioedema, LPR, and a history of interstitial lung disease and allergic rhinitis and urticaria.  His last visit to this clinic was his initial evaluation of 12 September 2017.  During subsequent evaluation he had blood test results consistent with type II hereditary angioedema and we have been attempting to start him on icatibant injections to be used as needed as his swelling reactions are relatively intermittent.  To date, there has been a logistical problem obtaining this medication.  We started therapy for LPR during his last evaluation and he has noticed a rather significant improvement regarding his chronic cough and his throat clearing and his hoarseness.  We had him aggressively treat his upper airway inflammatory condition and that has also improved as well and he has very little nasal congestion and sneezing.  He has not been having any issues with hives.   Allergies as of 10/16/2017      Reactions   Lisinopril Swelling   Facial and lip swelling. And throat swelling.    Benadryl [diphenhydramine Hcl] Swelling   Facial swelling from high dose (tolerates Advil PM as needed)   Darvocet [propoxyphene N-acetaminophen] Other (See Comments)   hallucination   Darvon [propoxyphene Hcl] Other (See Comments)   Hallucination   Tylenol [acetaminophen] Swelling   Foot swelling      Medication List      azelastine 0.1 % nasal spray Commonly known as:  ASTELIN Place 1 spray into both nostrils 2 (two) times daily.   clobetasol ointment 0.05 % Commonly known as:  TEMOVATE Apply 1 application topically daily as needed (cellulitis on legs).     clopidogrel 75 MG tablet Commonly known as:  PLAVIX TAKE 1 TABLET(75 MG) BY MOUTH DAILY   EPINEPHrine 0.3 mg/0.3 mL Soaj injection Commonly known as:  EPI-PEN INJECT INTRAMUSCULARLY AS DIRECTED   famotidine 20 MG tablet Commonly known as:  PEPCID Take 2 tablets (40 mg total) by mouth at bedtime.   fluticasone 50 MCG/ACT nasal spray Commonly known as:  FLONASE Place 1 spray into both nostrils daily.   levothyroxine 75 MCG tablet Commonly known as:  SYNTHROID, LEVOTHROID Take 75 mcg by mouth daily before breakfast.   LIVALO 4 MG Tabs Generic drug:  Pitavastatin Calcium Take 4 mg by mouth at bedtime.   metoprolol tartrate 25 MG tablet Commonly known as:  LOPRESSOR Take 1 tablet (25 mg total) by mouth 2 (two) times daily.   montelukast 10 MG tablet Commonly known as:  SINGULAIR Take 1 tablet (10 mg total) by mouth at bedtime.   nabumetone 750 MG tablet Commonly known as:  RELAFEN Take 1 tablet by mouth daily.   nitroGLYCERIN 0.4 MG SL tablet Commonly known as:  NITROSTAT DISSOLVE 1 TABLET UNDER TONGUE EVERY 5 MINUTES FOR 3 DOSES AS NEEDED CHEST PAIN. IF NO RELIEF CALL 911   nystatin 100000 UNIT/ML suspension Commonly known as:  MYCOSTATIN Take 2.5 mLs by mouth at bedtime as needed (thrush). Swish and spit   pantoprazole 40 MG tablet Commonly known as:  PROTONIX Take 1 tablet (40 mg total) by mouth 2 (two) times daily.   saccharomyces boulardii 250 MG capsule Commonly  known as:  FLORASTOR Take 1 capsule (250 mg total) by mouth 2 (two) times daily.   SYSTANE OP Place 1 drop into both eyes 2 (two) times daily.       Past Medical History:  Diagnosis Date  . Angio-edema   . Anxiety   . Arthritis   . CAD (coronary artery disease)    a. 02/2015: DES to mid-LAD  . GERD (gastroesophageal reflux disease)   . Gout   . High cholesterol   . History of prolonged Q-T interval on ECG   . Hx of Clostridium difficile infection   . Hx of umbilical hernia repair   .  Personal history of digestive disease    gastric ulcer  . Prostate cancer ([place])   . Prostate enlargement   . Skin cancer    basal cell carcinoma  . Thyroid disease   . Urticaria     Past Surgical History:  Procedure Laterality Date  . APPENDECTOMY    . BACK SURGERY    . CARDIAC CATHETERIZATION N/A 02/21/2015   Procedure: Left Heart Cath;  Surgeon: Lorretta Harp, MD;  Location: [facility];  Service: Cardiovascular;  Laterality: N/A;  . CATARACT EXTRACTION, BILATERAL    . NASAL SINUS SURGERY    . PROSTATE BIOPSY    . right shoulder rotator cuff repair      Review of systems negative except as noted in HPI / PMHx or noted below:  Review of Systems  Constitutional: Negative.   HENT: Negative.   Eyes: Negative.   Respiratory: Negative.   Cardiovascular: Negative.   Gastrointestinal: Negative.   Genitourinary: Negative.   Musculoskeletal: Negative.   Skin: Negative.   Neurological: Negative.   Endo/Heme/Allergies: Negative.   Psychiatric/Behavioral: Negative.      Objective:   Vitals:   10/16/17 0849  BP: (!) 148/86  Pulse: 80  Resp: 20          Physical Exam  Constitutional:  Slightly raspy voice  HENT:  Head: Normocephalic.  Right Ear: Tympanic membrane, external ear and ear canal normal.  Left Ear: Tympanic membrane, external ear and ear canal normal.  Nose: Nose normal. No mucosal edema or rhinorrhea.  Mouth/Throat: Uvula is midline, oropharynx is clear and moist and mucous membranes are normal. No oropharyngeal exudate.  Eyes: Conjunctivae are normal.  Neck: Trachea normal. No tracheal tenderness present. No tracheal deviation present. No thyromegaly present.  Cardiovascular: Normal rate, regular rhythm, S1 normal, S2 normal and normal heart sounds.  No murmur heard. Pulmonary/Chest: Breath sounds normal. No stridor. No respiratory distress. He has no wheezes. He has no rales.  Musculoskeletal: He exhibits no edema.  Lymphadenopathy:        Head (right side): No tonsillar adenopathy present.       Head (left side): No tonsillar adenopathy present.    He has no cervical adenopathy.  Neurological: He is alert.  Skin: No rash noted. He is not diaphoretic. No erythema. Nails show no clubbing.    Diagnostics:    Results of blood tests obtained 12 September 2017 identified C1 esterase inhibitor function 26%, C1 esterase inhibitor level 79 mg/DL, C4 11 mg/DL, C1q 17.3 mg/DL, normal SPEP without M spike, WBC 5.2, absolute eosinophil 100, absolute lymphocyte 1000, hemoglobin 15.4, platelet 172.  Results of blood tests obtained 18 September 2017 identified C1 esterase inhibitor function 16%, C1 esterase inhibitor level 73 mg/DL, C4 11 mg/DL  Assessment and Plan:   1. Hereditary angioedema type 2 ([place])  2. Allergic urticaria   3. Other allergic rhinitis   4. LPRD (laryngopharyngeal reflux disease)   5. ILD (interstitial lung disease) ([place])     1.  Allergen avoidance measures - Dust Mite  2.  Continue to Treat and prevent inflammation:    A.  Flonase - 1 spray each nostril twice a day  B.  Azelastine - 1 spray each nostril twice a day  3.  Continue to Treat and prevent reflux:   A.  Slowly eliminate all caffeine consumption  B.  pantoprazole 40 mg twice a day  C.  famotidine 40 mg in evening  4.  If needed:   A.  Nasal saline spray  B.  OTC antihistamine - loratadine 10 mg 1 time per day  C.  OTC Mucinex DM - 2 tablets twice a day  D.  Firazyr injection for swelling reaction  5. Return to clinic in 8 weeks or earlier if problem  6. Obtain fall flu vaccine  Daniel Herrera has type II hereditary angioedema.  I had a long talk with him today about this condition.  He needs to be very careful about traumatic events including manipulation of his oral cavity with dental work.  He should use icatibant injection if he ever develops a swelling reaction.  Epinephrine will not help these forms of swelling.  If his frequency of swelling episodes  increases then we may need to place him on a preventative therapy.  I have asked him to discuss this issue with all of his children and extended family so they can be screened for this inherited disorder.  His cough and throat issue and nose issue appears to be coming under very good control with therapy directed against respiratory tract inflammation and reflux as noted above.  I would like to see him back in his clinic after he utilizes a full 12 weeks of therapy and thus I will see him back in this clinic in 8 weeks.  Allena Katz, MD Allergy / Immunology [place]

## 2017-10-23 ENCOUNTER — Ambulatory Visit (INDEPENDENT_AMBULATORY_CARE_PROVIDER_SITE_OTHER): Payer: Medicare Other | Admitting: Allergy and Immunology

## 2017-10-23 ENCOUNTER — Encounter: Payer: Self-pay | Admitting: Allergy and Immunology

## 2017-10-23 VITALS — BP 136/86 | HR 68 | Resp 16

## 2017-10-23 DIAGNOSIS — J849 Interstitial pulmonary disease, unspecified: Secondary | ICD-10-CM

## 2017-10-23 DIAGNOSIS — K219 Gastro-esophageal reflux disease without esophagitis: Secondary | ICD-10-CM

## 2017-10-23 DIAGNOSIS — D841 Defects in the complement system: Secondary | ICD-10-CM

## 2017-10-23 DIAGNOSIS — J3089 Other allergic rhinitis: Secondary | ICD-10-CM

## 2017-10-23 DIAGNOSIS — L500 Allergic urticaria: Secondary | ICD-10-CM | POA: Diagnosis not present

## 2017-10-23 MED ORDER — FAMOTIDINE 40 MG PO TABS: 40.0000 mg | ORAL_TABLET | Freq: Every evening | ORAL | 5 refills | Status: DC

## 2017-10-23 NOTE — Patient Instructions (Addendum)
  1.  Start Takhzryo every 14 days. First dose ASAP  2.  Continue to Treat and prevent inflammation:    A.  Flonase - 1 spray each nostril twice a day  B.  Azelastine - 1 spray each nostril twice a day  3.  Continue to Treat and prevent reflux:   A.  Slowly eliminate all caffeine consumption  B.  pantoprazole 40 mg twice a day  C.  famotidine 40 mg in evening  4.  If needed:   A.  Nasal saline spray  B.  OTC antihistamine - loratadine 10 mg 1 time per day  C.  OTC Mucinex DM - 2 tablets twice a day  D.  Firazyr injection for swelling reaction  5. Return to clinic in 8 weeks or earlier if problem

## 2017-10-23 NOTE — Progress Notes (Signed)
Follow-up Note  Referring Provider: [name], NP Primary Provider: [name], NP Date of Office Visit: 10/23/2017  Subjective:   Daniel Herrera (DOB: 23-May-1940) is a 77 y.o. male who returns to the [place] on 10/23/2017 in re-evaluation of the following:  HPI: [name] returns to this clinic in reevaluation of his type II hereditary angioedema, LPR, history of interstitial lung disease and allergic rhinitis and urticaria.  His last visit to this clinic was 16 October 2017.  On further questioning of his recurrent angioedema he now admits to having lots of episodes of angioedema.  The issue is that he just does not tell anyone about his angioedema.  He has had a lifelong history of recurrent episodes of neck and face swelling and recurrent issues with scrotal swelling and hand swelling.  He spends more days than not with some degree of swelling.  He had face and neck swelling starting last Tuesday that became pretty significant to the point that he was thinking about going to the emergency room but fortunately over the course of the past 36 hours or so his swelling has improved.  He finally submitted his paperwork for the Firazyr injection that we were going to use for what appeared to be intermittent angioedema upon initial evaluation.  However, he has not acquired this medication yet.  In addition, he has had this right neck pain that developed over the course of the past few days.  It only appears to occur when he turns his neck to the right.  He has had no other associated symptoms with this issue.  All his other issues are under good control as noted during his last visit of 16 October 2017.  Allergies as of 10/23/2017      Reactions   Lisinopril Swelling   Facial and lip swelling. And throat swelling.    Benadryl [diphenhydramine Hcl] Swelling   Facial swelling from high dose (tolerates Advil PM as needed)   Darvocet [propoxyphene N-acetaminophen] Other (See  Comments)   hallucination   Darvon [propoxyphene Hcl] Other (See Comments)   Hallucination   Tylenol [acetaminophen] Swelling   Foot swelling      Medication List      azelastine 0.1 % nasal spray Commonly known as:  ASTELIN Place 1 spray into both nostrils 2 (two) times daily.   clobetasol ointment 0.05 % Commonly known as:  TEMOVATE Apply 1 application topically daily as needed (cellulitis on legs).   clopidogrel 75 MG tablet Commonly known as:  PLAVIX TAKE 1 TABLET(75 MG) BY MOUTH DAILY   EPINEPHrine 0.3 mg/0.3 mL Soaj injection Commonly known as:  EPI-PEN INJECT INTRAMUSCULARLY AS DIRECTED   famotidine 40 MG tablet Commonly known as:  PEPCID Take 1 tablet (40 mg total) by mouth every evening.   fluticasone 50 MCG/ACT nasal spray Commonly known as:  FLONASE Place 1 spray into both nostrils daily.   levothyroxine 75 MCG tablet Commonly known as:  SYNTHROID, LEVOTHROID Take 75 mcg by mouth daily before breakfast.   LIVALO 4 MG Tabs Generic drug:  Pitavastatin Calcium Take 4 mg by mouth at bedtime.   metoprolol tartrate 25 MG tablet Commonly known as:  LOPRESSOR Take 1 tablet (25 mg total) by mouth 2 (two) times daily.   montelukast 10 MG tablet Commonly known as:  SINGULAIR Take 1 tablet (10 mg total) by mouth at bedtime.   nabumetone 750 MG tablet Commonly known as:  RELAFEN Take 1 tablet by  mouth daily.   nitroGLYCERIN 0.4 MG SL tablet Commonly known as:  NITROSTAT DISSOLVE 1 TABLET UNDER TONGUE EVERY 5 MINUTES FOR 3 DOSES AS NEEDED CHEST PAIN. IF NO RELIEF CALL 911   nystatin 100000 UNIT/ML suspension Commonly known as:  MYCOSTATIN Take 2.5 mLs by mouth at bedtime as needed (thrush). Swish and spit   pantoprazole 40 MG tablet Commonly known as:  PROTONIX Take 1 tablet (40 mg total) by mouth 2 (two) times daily.   saccharomyces boulardii 250 MG capsule Commonly known as:  FLORASTOR Take 1 capsule (250 mg total) by mouth 2 (two) times daily.     SYSTANE OP Place 1 drop into both eyes 2 (two) times daily.       Past Medical History:  Diagnosis Date  . Angio-edema   . Anxiety   . Arthritis   . CAD (coronary artery disease)    a. 02/2015: DES to mid-LAD  . GERD (gastroesophageal reflux disease)   . Gout   . High cholesterol   . History of prolonged Q-T interval on ECG   . Hx of Clostridium difficile infection   . Hx of umbilical hernia repair   . Personal history of digestive disease    gastric ulcer  . Prostate cancer ([place])   . Prostate enlargement   . Skin cancer    basal cell carcinoma  . Thyroid disease   . Urticaria     Past Surgical History:  Procedure Laterality Date  . APPENDECTOMY    . BACK SURGERY    . CARDIAC CATHETERIZATION N/A 02/21/2015   Procedure: Left Heart Cath;  Surgeon: Lorretta Harp, MD;  Location: [facility];  Service: Cardiovascular;  Laterality: N/A;  . CATARACT EXTRACTION, BILATERAL    . NASAL SINUS SURGERY    . PROSTATE BIOPSY    . right shoulder rotator cuff repair      Review of systems negative except as noted in HPI / PMHx or noted below:  Review of Systems  Constitutional: Negative.   HENT: Negative.   Eyes: Negative.   Respiratory: Negative.   Cardiovascular: Negative.   Gastrointestinal: Negative.   Genitourinary: Negative.   Musculoskeletal: Negative.   Skin: Negative.   Neurological: Negative.   Endo/Heme/Allergies: Negative.   Psychiatric/Behavioral: Negative.      Objective:   Vitals:   10/23/17 0926  BP: 136/86  Pulse: 68  Resp: 16          Physical Exam  HENT:  Head: Normocephalic.  Right Ear: Tympanic membrane, external ear and ear canal normal.  Left Ear: Tympanic membrane, external ear and ear canal normal.  Nose: Nose normal. No mucosal edema or rhinorrhea.  Mouth/Throat: Uvula is midline, oropharynx is clear and moist and mucous membranes are normal. No oropharyngeal exudate.  Eyes: Conjunctivae are normal.  Neck: Trachea  normal. No tracheal tenderness present. No tracheal deviation present. No thyromegaly present.  Cardiovascular: Normal rate, regular rhythm, S1 normal, S2 normal and normal heart sounds.  No murmur heard. Pulmonary/Chest: Breath sounds normal. No stridor. No respiratory distress. He has no wheezes. He has no rales.  Musculoskeletal: He exhibits no edema.  Lymphadenopathy:       Head (right side): No tonsillar adenopathy present.       Head (left side): No tonsillar adenopathy present.    He has no cervical adenopathy.  Neurological: He is alert.  Skin: No rash noted. He is not diaphoretic. No erythema. Nails show no clubbing.    Diagnostics:  none  Assessment and Plan:   1. Hereditary angioedema type 2 ([place])   2. Allergic urticaria   3. Other allergic rhinitis   4. LPRD (laryngopharyngeal reflux disease)   5. ILD (interstitial lung disease) ([place])     1.  Start Takhzryo every 14 days. First dose ASAP  2.  Continue to Treat and prevent inflammation:    A.  Flonase - 1 spray each nostril twice a day  B.  Azelastine - 1 spray each nostril twice a day  3.  Continue to Treat and prevent reflux:   A.  Slowly eliminate all caffeine consumption  B.  pantoprazole 40 mg twice a day  C.  famotidine 40 mg in evening  4.  If needed:   A.  Nasal saline spray  B.  OTC antihistamine - loratadine 10 mg 1 time per day  C.  OTC Mucinex DM - 2 tablets twice a day  D.  Firazyr injection for swelling reaction  5. Return to clinic in 8 weeks or earlier if problem  [name]'s presentation has changed based upon a retrospective analysis of how often he actually does develop swelling and we now need to place him on a controller agent for his type II hereditary angioedema and we will try to get him on Takhzryo as soon as possible to be utilized every 14 days.  He can still use his Firazyr for breakthrough episodes of angioedema.  Allena Katz, MD Allergy / Immunology [place]

## 2017-10-24 ENCOUNTER — Encounter: Payer: Self-pay | Admitting: Allergy and Immunology

## 2017-11-05 DIAGNOSIS — E119 Type 2 diabetes mellitus without complications: Secondary | ICD-10-CM | POA: Diagnosis not present

## 2017-11-05 DIAGNOSIS — E785 Hyperlipidemia, unspecified: Secondary | ICD-10-CM | POA: Diagnosis not present

## 2017-11-05 DIAGNOSIS — Z1339 Encounter for screening examination for other mental health and behavioral disorders: Secondary | ICD-10-CM | POA: Diagnosis not present

## 2017-11-05 DIAGNOSIS — I2510 Atherosclerotic heart disease of native coronary artery without angina pectoris: Secondary | ICD-10-CM | POA: Diagnosis not present

## 2017-11-14 DIAGNOSIS — L821 Other seborrheic keratosis: Secondary | ICD-10-CM | POA: Diagnosis not present

## 2017-11-14 DIAGNOSIS — C44311 Basal cell carcinoma of skin of nose: Secondary | ICD-10-CM | POA: Diagnosis not present

## 2017-11-14 DIAGNOSIS — L578 Other skin changes due to chronic exposure to nonionizing radiation: Secondary | ICD-10-CM | POA: Diagnosis not present

## 2017-11-14 DIAGNOSIS — L570 Actinic keratosis: Secondary | ICD-10-CM | POA: Diagnosis not present

## 2017-11-16 ENCOUNTER — Telehealth: Payer: Self-pay

## 2017-11-16 NOTE — Telephone Encounter (Signed)
Mr. Rabine called letting Dr. Neldon Mc know that he was receiving training from a representative of [employer] contracted through [facility] named Robinson for [place] and Icatibant. Mrs. Earnest Bailey stated Mr. Hoeffner woke up this AM with facial swelling and neck swelling. She also stated once he receive his first injection of Icatibant his swelling decreased by the PM. Patient has two injections left and it seems to be helping with his swelling. Patient is taking Takhzryo for every 14 days for self maintenance. Please advise if patient needs to continue his 2nd dose of Icatibant and any further doses. Can call Mr Dalzell with the info.

## 2017-11-18 NOTE — Telephone Encounter (Signed)
I think the issue is if he does not respond to [facility] then we need to switch him to another agent. He should NOT be having swelling if this medication works. When did he receive first dose and when did he receive second dose?

## 2017-11-19 NOTE — Telephone Encounter (Signed)
He said he has follow-up appt and will stay with plan until seen by Dr Neldon Mc unless somwthing else comes up and at that time will contact clinic

## 2017-11-19 NOTE — Telephone Encounter (Signed)
T/C to patient he advised he has had 2 doses of the Takhzyro and not any issues.  He thinks that facial swelling was not from his HAE but he had been to dermatologist that day and had some spots frozen on face/neck and thinks that attributed to his swelling episode

## 2017-11-19 NOTE — Telephone Encounter (Signed)
Will certainly trauma can precipitate a swelling episode.  Just let him know that we will hold the course and see what happens as we move forward the next several weeks to months.

## 2017-11-27 DIAGNOSIS — C61 Malignant neoplasm of prostate: Secondary | ICD-10-CM | POA: Diagnosis not present

## 2017-12-04 DIAGNOSIS — R351 Nocturia: Secondary | ICD-10-CM | POA: Diagnosis not present

## 2017-12-04 DIAGNOSIS — C61 Malignant neoplasm of prostate: Secondary | ICD-10-CM | POA: Diagnosis not present

## 2017-12-12 ENCOUNTER — Ambulatory Visit (INDEPENDENT_AMBULATORY_CARE_PROVIDER_SITE_OTHER): Payer: Medicare Other | Admitting: Allergy and Immunology

## 2017-12-12 ENCOUNTER — Encounter: Payer: Self-pay | Admitting: Allergy and Immunology

## 2017-12-12 VITALS — BP 102/62 | HR 69 | Resp 16 | Ht 73.0 in | Wt 204.8 lb

## 2017-12-12 DIAGNOSIS — K219 Gastro-esophageal reflux disease without esophagitis: Secondary | ICD-10-CM | POA: Diagnosis not present

## 2017-12-12 DIAGNOSIS — J3089 Other allergic rhinitis: Secondary | ICD-10-CM

## 2017-12-12 DIAGNOSIS — D841 Defects in the complement system: Secondary | ICD-10-CM | POA: Diagnosis not present

## 2017-12-12 MED ORDER — PANTOPRAZOLE SODIUM 40 MG PO TBEC: 40.0000 mg | DELAYED_RELEASE_TABLET | Freq: Two times a day (BID) | ORAL | 5 refills | Status: DC

## 2017-12-12 MED ORDER — FAMOTIDINE 40 MG PO TABS: 40.0000 mg | ORAL_TABLET | Freq: Every evening | ORAL | 5 refills | Status: DC

## 2017-12-12 MED ORDER — AZELASTINE HCL 0.1 % NA SOLN: 1.0000 | Freq: Two times a day (BID) | NASAL | 5 refills | Status: DC

## 2017-12-12 MED ORDER — FLUTICASONE PROPIONATE 50 MCG/ACT NA SUSP: 1.0000 | Freq: Every day | NASAL | 4 refills | Status: DC

## 2017-12-12 NOTE — Progress Notes (Signed)
Follow-up Note  Referring Provider: [name], NP Primary Provider: [name], NP Date of Office Visit: 12/12/2017  Subjective:   Daniel Herrera (DOB: 24-Feb-1941) is a 77 y.o. male who returns to the [facility] [place] on 12/12/2017 in re-evaluation of the following:  HPI: Daniel Herrera returns to this clinic in reevaluation of his type II hereditary angioedema treated with [employer] as well as allergic rhinitis and LPR.  His last visit to this clinic was 23 October 2017.  He has not had any swelling episodes.  When he wakes up in the morning he notices a little bit of redness on his face and on his skin of his neck but no swelling.  He has not had any oral pharyngeal swelling or swelling of his extremities.  He continues on [name] every 2 weeks  His nose has really been doing quite well.  He has not been having good control of his reflux.  He still has some throat clearing and mucus in his throat and raspy voice and he does have regurgitation.  He has eliminated all caffeine consumption and is using pantoprazole and famotidine consistently.  Allergies as of 12/12/2017      Reactions   Lisinopril Swelling   Facial and lip swelling. And throat swelling.    Benadryl [diphenhydramine Hcl] Swelling   Facial swelling from high dose (tolerates Advil PM as needed)   Darvocet [propoxyphene N-acetaminophen] Other (See Comments)   hallucination   Darvon [propoxyphene Hcl] Other (See Comments)   Hallucination   Tylenol [acetaminophen] Swelling   Foot swelling      Medication List      azelastine 0.1 % nasal spray Commonly known as:  ASTELIN Place 1 spray into both nostrils 2 (two) times daily.   clobetasol ointment 0.05 % Commonly known as:  TEMOVATE Apply 1 application topically daily as needed (cellulitis on legs).   clopidogrel 75 MG tablet Commonly known as:  PLAVIX TAKE 1 TABLET(75 MG) BY MOUTH DAILY   EPINEPHrine 0.3 mg/0.3 mL Soaj injection Commonly known as:   EPI-PEN INJECT INTRAMUSCULARLY AS DIRECTED   famotidine 40 MG tablet Commonly known as:  PEPCID Take 1 tablet (40 mg total) by mouth every evening.   fluticasone 50 MCG/ACT nasal spray Commonly known as:  FLONASE Place 1 spray into both nostrils daily.   levothyroxine 75 MCG tablet Commonly known as:  SYNTHROID, LEVOTHROID Take 75 mcg by mouth daily before breakfast.   LIVALO 4 MG Tabs Generic drug:  Pitavastatin Calcium Take 4 mg by mouth at bedtime.   metoprolol tartrate 25 MG tablet Commonly known as:  LOPRESSOR Take 1 tablet (25 mg total) by mouth 2 (two) times daily.   montelukast 10 MG tablet Commonly known as:  SINGULAIR Take 1 tablet (10 mg total) by mouth at bedtime.   nabumetone 750 MG tablet Commonly known as:  RELAFEN Take 1 tablet by mouth daily.   nitroGLYCERIN 0.4 MG SL tablet Commonly known as:  NITROSTAT DISSOLVE 1 TABLET UNDER TONGUE EVERY 5 MINUTES FOR 3 DOSES AS NEEDED CHEST PAIN. IF NO RELIEF CALL 911   nystatin 100000 UNIT/ML suspension Commonly known as:  MYCOSTATIN Take 2.5 mLs by mouth at bedtime as needed (thrush). Swish and spit   pantoprazole 40 MG tablet Commonly known as:  PROTONIX Take 1 tablet (40 mg total) by mouth 2 (two) times daily.   saccharomyces boulardii 250 MG capsule Commonly known as:  FLORASTOR Take 1 capsule (250 mg total)  by mouth 2 (two) times daily.   SYSTANE OP Place 1 drop into both eyes 2 (two) times daily.       Past Medical History:  Diagnosis Date  . Angio-edema   . Anxiety   . Arthritis   . CAD (coronary artery disease)    a. 02/2015: DES to mid-LAD  . GERD (gastroesophageal reflux disease)   . Gout   . High cholesterol   . History of prolonged Q-T interval on ECG   . Hx of Clostridium difficile infection   . Hx of umbilical hernia repair   . Personal history of digestive disease    gastric ulcer  . Prostate cancer ([place])   . Prostate enlargement   . Skin cancer    basal cell carcinoma  .  Thyroid disease   . Urticaria     Past Surgical History:  Procedure Laterality Date  . APPENDECTOMY    . BACK SURGERY    . CARDIAC CATHETERIZATION N/A 02/21/2015   Procedure: Left Heart Cath;  Surgeon: Lorretta Harp, MD;  Location: [place] [facility];  Service: Cardiovascular;  Laterality: N/A;  . CATARACT EXTRACTION, BILATERAL    . NASAL SINUS SURGERY    . PROSTATE BIOPSY    . right shoulder rotator cuff repair      Review of systems negative except as noted in HPI / PMHx or noted below:  Review of Systems  Constitutional: Negative.   HENT: Negative.   Eyes: Negative.   Respiratory: Negative.   Cardiovascular: Negative.   Gastrointestinal: Negative.   Genitourinary: Negative.   Musculoskeletal: Negative.   Skin: Negative.   Neurological: Negative.   Endo/Heme/Allergies: Negative.   Psychiatric/Behavioral: Negative.      Objective:   Vitals:   12/12/17 0952  BP: 102/62  Pulse: 69  Resp: 16  SpO2: 97%   Height: 6\' 1"  (185.4 cm)  Weight: 204 lb 12.8 oz (92.9 kg)   Physical Exam  Constitutional:  Raspy voice  HENT:  Head: Normocephalic.  Right Ear: Tympanic membrane, external ear and ear canal normal.  Left Ear: Tympanic membrane, external ear and ear canal normal.  Nose: Nose normal. No mucosal edema or rhinorrhea.  Mouth/Throat: Uvula is midline, oropharynx is clear and moist and mucous membranes are normal. No oropharyngeal exudate.  Eyes: Conjunctivae are normal.  Neck: Trachea normal. No tracheal tenderness present. No tracheal deviation present. No thyromegaly present.  Cardiovascular: Normal rate, regular rhythm, S1 normal, S2 normal and normal heart sounds.  No murmur heard. Pulmonary/Chest: Breath sounds normal. No stridor. No respiratory distress. He has no wheezes. He has no rales.  Musculoskeletal: He exhibits no edema.  Lymphadenopathy:       Head (right side): No tonsillar adenopathy present.       Head (left side): No tonsillar adenopathy  present.    He has no cervical adenopathy.  Neurological: He is alert.  Skin: No rash noted. He is not diaphoretic. No erythema. Nails show no clubbing.    Diagnostics: none  Assessment and Plan:   1. Hereditary angioedema type 2 ([place])   2. Perennial allergic rhinitis   3. LPRD (laryngopharyngeal reflux disease)     1.  Continue Takhzryo every 14 days.   2.  Continue to Treat and prevent inflammation:    A.  Flonase - 1 spray each nostril twice a day  B.  Azelastine - 1 spray each nostril twice a day  3.  Continue to Treat and prevent reflux:  A.  Slowly eliminate all caffeine consumption  B.  pantoprazole 40 mg twice a day  C.  famotidine 40 mg in evening  4.  If needed:   A.  Nasal saline spray  B.  OTC antihistamine - loratadine 10 mg 1 time per day  C.  OTC Mucinex DM - 2 tablets twice a day  D.  Firazyr injection for swelling reaction  5. Evaluation with ENT for LPR  6. Return to clinic in December 2019 or earlier if problem  7. 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  His nose appears to be doing well.  His throat and LPR are still active.  We will have his throat evaluated by ENT to make sure were not dealing with some other significant issue other than LPR.  I will see him back in this clinic in December 2019 or earlier if there is a problem.  Allena Katz, MD Allergy / Immunology [place]

## 2017-12-12 NOTE — Patient Instructions (Addendum)
  1.  Continue Takhzryo every 14 days.   2.  Continue to Treat and prevent inflammation:    A.  Flonase - 1 spray each nostril twice a day  B.  Azelastine - 1 spray each nostril twice a day  3.  Continue to Treat and prevent reflux:   A.  Slowly eliminate all caffeine consumption  B.  pantoprazole 40 mg twice a day  C.  famotidine 40 mg in evening  4.  If needed:   A.  Nasal saline spray  B.  OTC antihistamine - loratadine 10 mg 1 time per day  C.  OTC Mucinex DM - 2 tablets twice a day  D.  Firazyr injection for swelling reaction  5. Evaluation with ENT for LPR  6. Return to clinic in December 2019 or earlier if problem  7. Obtain fall flu vaccine

## 2017-12-13 ENCOUNTER — Encounter: Payer: Self-pay | Admitting: Allergy and Immunology

## 2017-12-13 ENCOUNTER — Telehealth: Payer: Self-pay

## 2017-12-13 NOTE — Telephone Encounter (Signed)
-----   Message from Rosalio Loud, [place] sent at 12/12/2017 10:31 AM EDT ----- Regarding: Referral Dr. Neldon Mc would like patient to see ENT for LPR. Please and thank you

## 2017-12-13 NOTE — Telephone Encounter (Signed)
Referral has been placed in proficient for ENT.

## 2017-12-21 ENCOUNTER — Encounter (HOSPITAL_COMMUNITY): Payer: Self-pay | Admitting: Emergency Medicine

## 2017-12-21 ENCOUNTER — Emergency Department (HOSPITAL_COMMUNITY): Payer: Medicare Other

## 2017-12-21 ENCOUNTER — Other Ambulatory Visit: Payer: Self-pay

## 2017-12-21 ENCOUNTER — Observation Stay (HOSPITAL_COMMUNITY)
Admission: EM | Admit: 2017-12-21 | Discharge: 2017-12-23 | Disposition: A | Discharge status: Home / Self Care | Payer: Medicare Other | Attending: Internal Medicine | Admitting: Internal Medicine

## 2017-12-21 DIAGNOSIS — E039 Hypothyroidism, unspecified: Secondary | ICD-10-CM | POA: Diagnosis not present

## 2017-12-21 DIAGNOSIS — Z955 Presence of coronary angioplasty implant and graft: Secondary | ICD-10-CM | POA: Diagnosis not present

## 2017-12-21 DIAGNOSIS — Z79899 Other long term (current) drug therapy: Secondary | ICD-10-CM | POA: Insufficient documentation

## 2017-12-21 DIAGNOSIS — Z7901 Long term (current) use of anticoagulants: Secondary | ICD-10-CM | POA: Insufficient documentation

## 2017-12-21 DIAGNOSIS — Z8719 Personal history of other diseases of the digestive system: Secondary | ICD-10-CM | POA: Insufficient documentation

## 2017-12-21 DIAGNOSIS — Z85828 Personal history of other malignant neoplasm of skin: Secondary | ICD-10-CM | POA: Insufficient documentation

## 2017-12-21 DIAGNOSIS — Z886 Allergy status to analgesic agent status: Secondary | ICD-10-CM | POA: Insufficient documentation

## 2017-12-21 DIAGNOSIS — F419 Anxiety disorder, unspecified: Secondary | ICD-10-CM | POA: Diagnosis not present

## 2017-12-21 DIAGNOSIS — N400 Enlarged prostate without lower urinary tract symptoms: Secondary | ICD-10-CM | POA: Insufficient documentation

## 2017-12-21 DIAGNOSIS — Z923 Personal history of irradiation: Secondary | ICD-10-CM | POA: Insufficient documentation

## 2017-12-21 DIAGNOSIS — K921 Melena: Secondary | ICD-10-CM

## 2017-12-21 DIAGNOSIS — Z66 Do not resuscitate: Secondary | ICD-10-CM | POA: Insufficient documentation

## 2017-12-21 DIAGNOSIS — I700 Atherosclerosis of aorta: Secondary | ICD-10-CM | POA: Diagnosis not present

## 2017-12-21 DIAGNOSIS — E118 Type 2 diabetes mellitus with unspecified complications: Secondary | ICD-10-CM | POA: Diagnosis present

## 2017-12-21 DIAGNOSIS — K219 Gastro-esophageal reflux disease without esophagitis: Secondary | ICD-10-CM | POA: Diagnosis not present

## 2017-12-21 DIAGNOSIS — T783XXD Angioneurotic edema, subsequent encounter: Secondary | ICD-10-CM

## 2017-12-21 DIAGNOSIS — Z8546 Personal history of malignant neoplasm of prostate: Secondary | ICD-10-CM | POA: Diagnosis not present

## 2017-12-21 DIAGNOSIS — E1122 Type 2 diabetes mellitus with diabetic chronic kidney disease: Secondary | ICD-10-CM | POA: Diagnosis not present

## 2017-12-21 DIAGNOSIS — N183 Chronic kidney disease, stage 3 unspecified: Secondary | ICD-10-CM

## 2017-12-21 DIAGNOSIS — I10 Essential (primary) hypertension: Secondary | ICD-10-CM | POA: Diagnosis present

## 2017-12-21 DIAGNOSIS — I5022 Chronic systolic (congestive) heart failure: Secondary | ICD-10-CM | POA: Insufficient documentation

## 2017-12-21 DIAGNOSIS — I130 Hypertensive heart and chronic kidney disease with heart failure and stage 1 through stage 4 chronic kidney disease, or unspecified chronic kidney disease: Secondary | ICD-10-CM | POA: Insufficient documentation

## 2017-12-21 DIAGNOSIS — D841 Defects in the complement system: Secondary | ICD-10-CM | POA: Diagnosis present

## 2017-12-21 DIAGNOSIS — K922 Gastrointestinal hemorrhage, unspecified: Secondary | ICD-10-CM | POA: Diagnosis present

## 2017-12-21 DIAGNOSIS — E7800 Pure hypercholesterolemia, unspecified: Secondary | ICD-10-CM | POA: Diagnosis not present

## 2017-12-21 DIAGNOSIS — R05 Cough: Secondary | ICD-10-CM | POA: Diagnosis not present

## 2017-12-21 DIAGNOSIS — K625 Hemorrhage of anus and rectum: Principal | ICD-10-CM | POA: Insufficient documentation

## 2017-12-21 DIAGNOSIS — K5730 Diverticulosis of large intestine without perforation or abscess without bleeding: Secondary | ICD-10-CM | POA: Diagnosis not present

## 2017-12-21 DIAGNOSIS — M109 Gout, unspecified: Secondary | ICD-10-CM | POA: Diagnosis not present

## 2017-12-21 DIAGNOSIS — I2510 Atherosclerotic heart disease of native coronary artery without angina pectoris: Secondary | ICD-10-CM | POA: Insufficient documentation

## 2017-12-21 DIAGNOSIS — Z888 Allergy status to other drugs, medicaments and biological substances status: Secondary | ICD-10-CM | POA: Insufficient documentation

## 2017-12-21 HISTORY — DX: Gastrointestinal hemorrhage, unspecified: K92.2

## 2017-12-21 HISTORY — DX: Melena: K92.1

## 2017-12-21 HISTORY — DX: Chronic kidney disease, stage 3 unspecified: N18.30

## 2017-12-21 LAB — CBC WITH DIFFERENTIAL/PLATELET
ABS IMMATURE GRANULOCYTES: 0 10*3/uL (ref 0.0–0.1)
Abs Immature Granulocytes: 0 10*3/uL (ref 0.0–0.1)
BASOS ABS: 0 10*3/uL (ref 0.0–0.1)
BASOS ABS: 0 10*3/uL (ref 0.0–0.1)
BASOS PCT: 1 %
BASOS PCT: 1 %
EOS ABS: 0.1 10*3/uL (ref 0.0–0.7)
Eosinophils Absolute: 0.1 10*3/uL (ref 0.0–0.7)
Eosinophils Relative: 2 %
Eosinophils Relative: 1 %
HCT: 43.6 % (ref 39.0–52.0)
HEMATOCRIT: 41.6 % (ref 39.0–52.0)
HEMOGLOBIN: 13.6 g/dL (ref 13.0–17.0)
HEMOGLOBIN: 14.1 g/dL (ref 13.0–17.0)
Immature Granulocytes: 1 %
Immature Granulocytes: 1 %
LYMPHS PCT: 21 %
Lymphocytes Relative: 16 %
Lymphs Abs: 0.8 10*3/uL (ref 0.7–4.0)
Lymphs Abs: 1.2 10*3/uL (ref 0.7–4.0)
MCH: 31.5 pg (ref 26.0–34.0)
MCH: 31.1 pg (ref 26.0–34.0)
MCHC: 32.7 g/dL (ref 30.0–36.0)
MCHC: 32.3 g/dL (ref 30.0–36.0)
MCV: 96.3 fL (ref 78.0–100.0)
MCV: 96.2 fL (ref 78.0–100.0)
MONO ABS: 0.4 10*3/uL (ref 0.1–1.0)
Monocytes Absolute: 0.6 10*3/uL (ref 0.1–1.0)
Monocytes Relative: 9 %
Monocytes Relative: 10 %
NEUTROS ABS: 3.4 10*3/uL (ref 1.7–7.7)
NEUTROS PCT: 73 %
Neutro Abs: 3.9 10*3/uL (ref 1.7–7.7)
Neutrophils Relative %: 66 %
PLATELETS: 197 10*3/uL (ref 150–400)
Platelets: 176 10*3/uL (ref 150–400)
RBC: 4.32 MIL/uL (ref 4.22–5.81)
RBC: 4.53 MIL/uL (ref 4.22–5.81)
RDW: 12 % (ref 11.5–15.5)
RDW: 12.1 % (ref 11.5–15.5)
WBC: 4.7 10*3/uL (ref 4.0–10.5)
WBC: 5.8 10*3/uL (ref 4.0–10.5)

## 2017-12-21 LAB — COMPREHENSIVE METABOLIC PANEL
ALBUMIN: 3.8 g/dL (ref 3.5–5.0)
ALK PHOS: 87 U/L (ref 38–126)
ALT: 24 U/L (ref 0–44)
AST: 29 U/L (ref 15–41)
Anion gap: 10 (ref 5–15)
BILIRUBIN TOTAL: 0.7 mg/dL (ref 0.3–1.2)
BUN: 24 mg/dL — AB (ref 8–23)
CALCIUM: 8.8 mg/dL — AB (ref 8.9–10.3)
CO2: 21 mmol/L — ABNORMAL LOW (ref 22–32)
Chloride: 112 mmol/L — ABNORMAL HIGH (ref 98–111)
Creatinine, Ser: 1.46 mg/dL — ABNORMAL HIGH (ref 0.61–1.24)
GFR calc Af Amer: 51 mL/min — ABNORMAL LOW (ref >60)
GFR calc non Af Amer: 44 mL/min — ABNORMAL LOW (ref >60)
GLUCOSE: 169 mg/dL — AB (ref 70–99)
Potassium: 3.8 mmol/L (ref 3.5–5.1)
Sodium: 143 mmol/L (ref 135–145)
TOTAL PROTEIN: 5.8 g/dL — AB (ref 6.5–8.1)

## 2017-12-21 LAB — POC OCCULT BLOOD, ED: FECAL OCCULT BLD: POSITIVE — AB

## 2017-12-21 LAB — PROTIME-INR
INR: 0.97
Prothrombin Time: 12.8 seconds (ref 11.4–15.2)

## 2017-12-21 LAB — ABO/RH: ABO/RH(D): O NEG

## 2017-12-21 LAB — PREPARE RBC (CROSSMATCH)

## 2017-12-21 MED ORDER — SODIUM CHLORIDE 0.9 % IV BOLUS
1000.0000 mL | Freq: Once | INTRAVENOUS | Status: AC
  Administered 2017-12-21: 1000 mL via INTRAVENOUS

## 2017-12-21 MED ORDER — IOHEXOL 300 MG/ML  SOLN
100.0000 mL | Freq: Once | INTRAMUSCULAR | Status: AC | PRN
  Administered 2017-12-21: 100 mL via INTRAVENOUS

## 2017-12-21 MED ORDER — SODIUM CHLORIDE 0.9% IV SOLUTION: Freq: Once | INTRAVENOUS | Status: DC

## 2017-12-21 NOTE — H&P (Signed)
History and Physical    ALF DOYLE OMV:[id number] DOB: 1940/11/28 DOA: 12/21/2017  PCP: [name], NP  Patient coming from: home   Chief Complaint: rectal bleeding  HPI: Daniel Herrera is a 77 y.o. male with medical history significant for hypothyroid, prostate cancer s/p radiation and seeding, gout, cad/nstemi s/p DEX x2 2016, sCHF ef 45-50% 2016, hereditary angioedema, ckd, who presents w/ above.  Denies history rectal bleeding. No pain. Began this afternoon. Has had 6 moderate volume brb bowel movements. No diarrhea. No vomiting. No abdominal pain. Takes clopidogrel for stents placed in 2016. History colonoscopy "at least several" years ago, unaware of any abnormalities. No drinking or drug use. No daily nsaids. No chest pain or palpitations or fatigue.  ED Course: 1 L NS, labs, gi consult  Review of Systems: As per HPI otherwise 10 point review of systems negative.    Past Medical History:  Diagnosis Date  . Angio-edema   . Anxiety   . Arthritis   . CAD (coronary artery disease)    a. 02/2015: DES to mid-LAD  . GERD (gastroesophageal reflux disease)   . Gout   . High cholesterol   . History of prolonged Q-T interval on ECG   . Hx of Clostridium difficile infection   . Hx of umbilical hernia repair   . Personal history of digestive disease    gastric ulcer  . Prostate cancer ([place])   . Prostate enlargement   . Skin cancer    basal cell carcinoma  . Thyroid disease   . Urticaria     Past Surgical History:  Procedure Laterality Date  . APPENDECTOMY    . BACK SURGERY    . CARDIAC CATHETERIZATION N/A 02/21/2015   Procedure: Left Heart Cath;  Surgeon: Lorretta Harp, MD;  Location: [facility];  Service: Cardiovascular;  Laterality: N/A;  . CATARACT EXTRACTION, BILATERAL    . NASAL SINUS SURGERY    . PROSTATE BIOPSY    . right shoulder rotator cuff repair       reports that he has never smoked. He has never used smokeless tobacco. He reports  that he does not drink alcohol or use drugs.  Allergies  Allergen Reactions  . Lisinopril Swelling    Facial and lip swelling. And throat swelling.   . Benadryl [Diphenhydramine Hcl] Swelling    Facial swelling from high dose (tolerates Advil PM as needed)  . Darvocet [Propoxyphene N-Acetaminophen] Other (See Comments)    hallucination  . Darvon [Propoxyphene Hcl] Other (See Comments)    Hallucination   . Tylenol [Acetaminophen] Swelling    Foot swelling    Family History  Problem Relation Age of Onset  . Cancer Mother   . Cancer Father        prostate  . Cancer Sister     Prior to Admission medications   Medication Sig Start Date End Date Taking? Authorizing Provider  azelastine (ASTELIN) 0.1 % nasal spray Place 1 spray into both nostrils 2 (two) times daily. 12/12/17  Yes Kozlow, Donnamarie Poag, MD  clopidogrel (PLAVIX) 75 MG tablet TAKE 1 TABLET(75 MG) BY MOUTH DAILY Patient taking differently: Take 75 mg by mouth daily.  05/07/17  Yes Lorretta Harp, MD  EPINEPHrine 0.3 mg/0.3 mL IJ SOAJ injection Inject 0.3 mg into the muscle as needed (for allergic reaction).  08/21/17  Yes [provider]  famotidine (PEPCID) 40 MG tablet Take 1 tablet (40 mg total) by mouth every evening. 12/12/17  Yes Kozlow, Donnamarie Poag, MD  fluticasone (FLONASE) 50 MCG/ACT nasal spray Place 1 spray into both nostrils daily. 12/12/17  Yes Kozlow, Donnamarie Poag, MD  levothyroxine (SYNTHROID, LEVOTHROID) 75 MCG tablet Take 75 mcg by mouth daily before breakfast.   Yes [provider]  metoprolol tartrate (LOPRESSOR) 25 MG tablet Take 1 tablet (25 mg total) by mouth 2 (two) times daily. 05/25/15  Yes Short, Nguyen Todorov Delaine, MD  montelukast (SINGULAIR) 10 MG tablet Take 1 tablet (10 mg total) by mouth at bedtime. 09/12/17  Yes Kozlow, Donnamarie Poag, MD  nitroGLYCERIN (NITROSTAT) 0.4 MG SL tablet DISSOLVE 1 TABLET UNDER TONGUE EVERY 5 MINUTES FOR 3 DOSES AS NEEDED CHEST PAIN. IF NO RELIEF CALL 911 Patient taking differently:  Place 0.4 mg under the tongue every 5 (five) minutes as needed for chest pain.  03/03/15  Yes Simmons, Brittainy M, PA-C  pantoprazole (PROTONIX) 40 MG tablet Take 1 tablet (40 mg total) by mouth 2 (two) times daily. 12/12/17  Yes Kozlow, Donnamarie Poag, MD  Pitavastatin Calcium (LIVALO) 4 MG TABS Take 4 mg by mouth at bedtime.    Yes [provider]  Polyethyl Glycol-Propyl Glycol (SYSTANE OP) Place 1 drop into both eyes 2 (two) times daily.   Yes [provider]  saccharomyces boulardii (FLORASTOR) 250 MG capsule Take 1 capsule (250 mg total) by mouth 2 (two) times daily. Patient not taking: Reported on 12/21/2017 05/25/15   Janece Canterbury, MD    Physical Exam: Vitals:   12/21/17 2003 12/21/17 2131 12/21/17 2145 12/21/17 2200  BP: (!) 157/108 (!) 154/93 (!) 156/92 (!) 158/86  Pulse: 79 75 69 73  Resp: 20 16 14 12   Temp: 98.2 F (36.8 C)     TempSrc: Oral     SpO2: 98% 100% 100% 100%  Weight:      Height:        Constitutional: No acute distress Head: Atraumatic Eyes: Conjunctiva clear ENM: Moist mucous membranes. Normal dentition.  Neck: Supple Respiratory: Clear to auscultation bilaterally, no wheezing/rales/rhonchi. Normal respiratory effort. No accessory muscle use. . Cardiovascular: Regular rate and rhythm. No murmurs/rubs/gallops. Abdomen: Non-tender, non-distended. No masses. No rebound or guarding. Positive bowel sounds. Musculoskeletal: No joint deformity upper and lower extremities. Normal ROM, no contractures. Normal muscle tone.  Skin: No rashes, lesions, or ulcers.  Extremities: No peripheral edema. Palpable peripheral pulses. Neurologic: Alert, moving all 4 extremities. Psychiatric: Normal insight and judgement.   Labs on Admission: I have personally reviewed following labs and imaging studies  CBC: Recent Labs  Lab 12/21/17 1630 12/21/17 2131  WBC 4.7 5.8  NEUTROABS 3.4 3.9  HGB 13.6 14.1  HCT 41.6 43.6  MCV 96.3 96.2  PLT 176 789   Basic  Metabolic Panel: Recent Labs  Lab 12/21/17 1630  NA 143  K 3.8  CL 112*  CO2 21*  GLUCOSE 169*  BUN 24*  CREATININE 1.46*  CALCIUM 8.8*   GFR: Estimated Creatinine Clearance: 45 mL/min (A) (by C-G formula based on SCr of 1.46 mg/dL (H)). Liver Function Tests: Recent Labs  Lab 12/21/17 1630  AST 29  ALT 24  ALKPHOS 87  BILITOT 0.7  PROT 5.8*  ALBUMIN 3.8   No results for input(s): LIPASE, AMYLASE in the last 168 hours. No results for input(s): AMMONIA in the last 168 hours. Coagulation Profile: Recent Labs  Lab 12/21/17 1630  INR 0.97   Cardiac Enzymes: No results for input(s): CKTOTAL, CKMB, CKMBINDEX, TROPONINI in the last 168 hours. BNP (last 3 results)  No results for input(s): PROBNP in the last 8760 hours. HbA1C: No results for input(s): HGBA1C in the last 72 hours. CBG: No results for input(s): GLUCAP in the last 168 hours. Lipid Profile: No results for input(s): CHOL, HDL, LDLCALC, TRIG, CHOLHDL, LDLDIRECT in the last 72 hours. Thyroid Function Tests: No results for input(s): TSH, T4TOTAL, FREET4, T3FREE, THYROIDAB in the last 72 hours. Anemia Panel: No results for input(s): VITAMINB12, FOLATE, FERRITIN, TIBC, IRON, RETICCTPCT in the last 72 hours. Urine analysis:    Component Value Date/Time   COLORURINE YELLOW 07/28/2017 1402   APPEARANCEUR CLEAR 07/28/2017 1402   LABSPEC 1.013 07/28/2017 1402   PHURINE 5.0 07/28/2017 1402   GLUCOSEU NEGATIVE 07/28/2017 1402   HGBUR NEGATIVE 07/28/2017 1402   BILIRUBINUR NEGATIVE 07/28/2017 1402   KETONESUR 5 (A) 07/28/2017 1402   PROTEINUR NEGATIVE 07/28/2017 1402   UROBILINOGEN 0.2 10/27/2012 2246   NITRITE NEGATIVE 07/28/2017 1402   LEUKOCYTESUR NEGATIVE 07/28/2017 1402    Radiological Exams on Admission: Dg Chest 2 View  Result Date: 12/21/2017 CLINICAL DATA:  Productive cough EXAM: CHEST - 2 VIEW COMPARISON:  08/02/2017, 07/28/2017 FINDINGS: Diffuse bronchitic changes. No focal consolidation or  effusion. Stable cardiomediastinal silhouette. No pneumothorax. Biapical pleural and parenchymal scarring. Surgical changes in the right humeral head. IMPRESSION: 1. Findings suspicious for acute on chronic bronchitic changes. No focal pulmonary infiltrate is seen. Electronically Signed   By: Donavan Foil M.D.   On: 12/21/2017 18:21   Ct Abdomen Pelvis W Contrast  Result Date: 12/21/2017 CLINICAL DATA:  Pt st's he went to use the bathroom and had bright red bleeding coming from rectum. Pt is currently on blood thinners. Pt denies any abd pain or discomfort EXAM: CT ABDOMEN AND PELVIS WITH CONTRAST TECHNIQUE: Multidetector CT imaging of the abdomen and pelvis was performed using the standard protocol following bolus administration of intravenous contrast. CONTRAST:  75 mL OMNIPAQUE IOHEXOL 300 MG/ML  SOLN COMPARISON:  05/23/2015 FINDINGS: Lower chest: No acute abnormality. Hepatobiliary: No focal liver abnormality is seen. No gallstones, gallbladder wall thickening, or biliary dilatation. Pancreas: Unremarkable. No pancreatic ductal dilatation or surrounding inflammatory changes. Spleen: Normal in size without focal abnormality. Adrenals/Urinary Tract: No adrenal masses. Bilateral renal cortical thinning. Bilateral renal sinus cysts. No stones. No hydronephrosis. Symmetric renal enhancement and excretion. Normal ureters. 8 mm dependent stone in the bladder. Left posterior bladder diverticulum. Stone is larger than it was on the prior CT. Diverticulum is stable. Stomach/Bowel: Small hiatal hernia. Stomach otherwise unremarkable. Normal small bowel. There are numerous colonic diverticula predominantly along the sigmoid and descending colon. No evidence of diverticulitis. No colonic wall thickening or other inflammatory process. Vascular/Lymphatic: Aortic atherosclerosis. No enlarged abdominal or pelvic lymph nodes. Reproductive: Prostate normal in size. Other: No abdominal wall hernia or abnormality. No  abdominopelvic ascites. Musculoskeletal: No fracture or acute finding. No osteoblastic or osteolytic lesions. IMPRESSION: 1. No acute findings within the abdomen or pelvis. 2. Multiple colonic diverticula most prominent along the sigmoid and lower descending colon. No evidence of diverticulitis 3. Bilateral renal sinus cysts. 4. 8 mm dependent bladder stone. Small stable left posterior bladder diverticulum. 5. Aortic atherosclerosis. Electronically Signed   By: Lajean Manes M.D.   On: 12/21/2017 18:30     Assessment/Plan Active Problems:   NSTEMI (non-ST elevated myocardial infarction) [facility])   Essential hypertension   Coronary artery disease   Type 2 diabetes mellitus with unspecified complications (HCC)   Angioedema   Hematochezia   CKD (chronic kidney disease) stage 3,  GFR 30-59 ml/min (HCC)   GI bleed   # Acute GI bleed - likely lower. Hemodynamically stable, H/H still wnl, creatinine at baseline. Plavix likely contributory.  - have asked nursing to place 2nd IV - ns @ 100/hr - appreciate GI recs - NPO - ppi - 2 units prbcs on hold - tele - as has been several years since DES placement, likely indefinite hold - hold home bb for now, re- start once stabilized  # T2DM - diet controlled, a1c 6.4 05/2015. Initial glucose 169 - am glucose  # CAD # NSTEMI # Hx DES x2 - holding home metop and plavix as above  # hereditary angioedema - followed by heme. Takes lanadelumab every 2 wks, last dose was 9/13    DVT prophylaxis: scds, holding on pharmacologic Code Status: dnr, confirmed w/ patient  Family Communication: wife  Disposition Plan: tbd  Consults called: GI  Admission status: tele    Desma Maxim MD [facility] Pager [phone]  If 7PM-7AM, please contact night-coverage [email or web address] Password [email or web address]  12/21/2017, 11:00 PM

## 2017-12-21 NOTE — ED Provider Notes (Signed)
[facility] EMERGENCY DEPARTMENT Provider Note   CSN: [id number] Arrival date & time: 12/21/17  1500     History   Chief Complaint Chief Complaint  Patient presents with  . Rectal Bleeding    HPI Daniel Herrera is a 77 y.o. male hx of CAD, HL, here presenting with possible lower GI bleed.  Patient states that he had 2 episodes of bright red blood per rectum prior to arrival.  Denies any abdominal pain.  Patient states that he is currently on Plavix due to previous CAD with stents.  Denies being on any blood thinners.  He had a colonoscopy previously with Dr. Lyndel Safe from [facility]. His primary care doctor is in [place].  Patient denies any previous history of GI bleeds. Patient had 4 more episodes of bright red blood per rectum.   The history is provided by the patient.    Past Medical History:  Diagnosis Date  . Angio-edema   . Anxiety   . Arthritis   . CAD (coronary artery disease)    a. 02/2015: DES to mid-LAD  . GERD (gastroesophageal reflux disease)   . Gout   . High cholesterol   . History of prolonged Q-T interval on ECG   . Hx of Clostridium difficile infection   . Hx of umbilical hernia repair   . Personal history of digestive disease    gastric ulcer  . Prostate cancer ([place])   . Prostate enlargement   . Skin cancer    basal cell carcinoma  . Thyroid disease   . Urticaria     Patient Active Problem List   Diagnosis Date Noted  . Acute respiratory insufficiency   . Angioedema 07/28/2017  . Enterotoxigenic Escherichia coli infection 05/24/2015  . STEC (Shiga toxin-producing Escherichia coli) infection 05/24/2015  . Colitis 05/23/2015  . Prostate cancer ([place])   . Chronic diarrhea of unknown origin   . Chronic systolic CHF (congestive heart failure) ([place])   . CAD in native artery   . Type 2 diabetes mellitus with unspecified complications ([place])   . Coronary artery disease 04/08/2015  . Chest pain 04/08/2015  . Dyspnea 04/08/2015    . Palpitations 03/15/2015  . Non-STEMI (non-ST elevated myocardial infarction) ([place]) 02/22/2015  . NSTEMI (non-ST elevated myocardial infarction) ([place]) 02/21/2015  . Abnormal EKG 02/21/2015  . Essential hypertension 02/21/2015  . Hyperlipidemia 02/21/2015  . Malignant neoplasm of prostate ([place]) 08/07/2014  . Acute interstitial pneumonitis ([place]) 12/17/2013    Past Surgical History:  Procedure Laterality Date  . APPENDECTOMY    . BACK SURGERY    . CARDIAC CATHETERIZATION N/A 02/21/2015   Procedure: Left Heart Cath;  Surgeon: Lorretta Harp, MD;  Location: [facility];  Service: Cardiovascular;  Laterality: N/A;  . CATARACT EXTRACTION, BILATERAL    . NASAL SINUS SURGERY    . PROSTATE BIOPSY    . right shoulder rotator cuff repair          Home Medications    Prior to Admission medications   Medication Sig Start Date End Date Taking? Authorizing Provider  azelastine (ASTELIN) 0.1 % nasal spray Place 1 spray into both nostrils 2 (two) times daily. 12/12/17  Yes Kozlow, Donnamarie Poag, MD  clopidogrel (PLAVIX) 75 MG tablet TAKE 1 TABLET(75 MG) BY MOUTH DAILY Patient taking differently: Take 75 mg by mouth daily.  05/07/17  Yes Lorretta Harp, MD  EPINEPHrine 0.3 mg/0.3 mL IJ SOAJ injection Inject 0.3 mg into the muscle as  needed (for allergic reaction).  08/21/17  Yes [provider]  famotidine (PEPCID) 40 MG tablet Take 1 tablet (40 mg total) by mouth every evening. 12/12/17  Yes Kozlow, Donnamarie Poag, MD  fluticasone (FLONASE) 50 MCG/ACT nasal spray Place 1 spray into both nostrils daily. 12/12/17  Yes Kozlow, Donnamarie Poag, MD  levothyroxine (SYNTHROID, LEVOTHROID) 75 MCG tablet Take 75 mcg by mouth daily before breakfast.   Yes [provider]  metoprolol tartrate (LOPRESSOR) 25 MG tablet Take 1 tablet (25 mg total) by mouth 2 (two) times daily. 05/25/15  Yes Short, Noah Delaine, MD  montelukast (SINGULAIR) 10 MG tablet Take 1 tablet (10 mg total) by mouth at bedtime. 09/12/17  Yes  Kozlow, Donnamarie Poag, MD  nitroGLYCERIN (NITROSTAT) 0.4 MG SL tablet DISSOLVE 1 TABLET UNDER TONGUE EVERY 5 MINUTES FOR 3 DOSES AS NEEDED CHEST PAIN. IF NO RELIEF CALL 911 Patient taking differently: Place 0.4 mg under the tongue every 5 (five) minutes as needed for chest pain.  03/03/15  Yes Simmons, Brittainy M, PA-C  pantoprazole (PROTONIX) 40 MG tablet Take 1 tablet (40 mg total) by mouth 2 (two) times daily. 12/12/17  Yes Kozlow, Donnamarie Poag, MD  Pitavastatin Calcium (LIVALO) 4 MG TABS Take 4 mg by mouth at bedtime.    Yes [provider]  Polyethyl Glycol-Propyl Glycol (SYSTANE OP) Place 1 drop into both eyes 2 (two) times daily.   Yes [provider]  saccharomyces boulardii (FLORASTOR) 250 MG capsule Take 1 capsule (250 mg total) by mouth 2 (two) times daily. Patient not taking: Reported on 12/21/2017 05/25/15   Janece Canterbury, MD    Family History Family History  Problem Relation Age of Onset  . Cancer Mother   . Cancer Father        prostate  . Cancer Sister     Social History Social History   Tobacco Use  . Smoking status: Never Smoker  . Smokeless tobacco: Never Used  Substance Use Topics  . Alcohol use: No  . Drug use: No     Allergies   Lisinopril; Benadryl [diphenhydramine hcl]; Darvocet [propoxyphene n-acetaminophen]; Darvon [propoxyphene hcl]; and Tylenol [acetaminophen]   Review of Systems Review of Systems  Gastrointestinal: Positive for blood in stool.  All other systems reviewed and are negative.    Physical Exam Updated Vital Signs BP (!) 158/86   Pulse 73   Temp 98.2 F (36.8 C) (Oral)   Resp 12   Ht 6' (1.829 m)   Wt 90.7 kg   SpO2 100%   BMI 27.12 kg/m   Physical Exam  Constitutional: He is oriented to person, place, and time. He appears well-developed.  HENT:  Head: Normocephalic.  Mouth/Throat: Oropharynx is clear and moist.  Eyes: Pupils are equal, round, and reactive to light. Conjunctivae and EOM are normal.  Neck:  Normal range of motion. Neck supple.  Cardiovascular: Normal rate, regular rhythm and normal heart sounds.  Pulmonary/Chest: Effort normal and breath sounds normal. No stridor. No respiratory distress.  Abdominal: Soft. Bowel sounds are normal. He exhibits no distension. There is no tenderness.  Genitourinary:  Genitourinary Comments: Rectal- bright red blood on exam. No obvious hemorrhoid   Musculoskeletal: Normal range of motion.  Neurological: He is alert and oriented to person, place, and time. No cranial nerve deficit. Coordination normal.  Skin: Skin is warm.  Psychiatric: He has a normal mood and affect.  Nursing note and vitals reviewed.    ED Treatments / Results  Labs (all labs  ordered are listed, but only abnormal results are displayed) Labs Reviewed  COMPREHENSIVE METABOLIC PANEL - Abnormal; Notable for the following components:      Result Value   Chloride 112 (*)    CO2 21 (*)    Glucose, Bld 169 (*)    BUN 24 (*)    Creatinine, Ser 1.46 (*)    Calcium 8.8 (*)    Total Protein 5.8 (*)    GFR calc non Af Amer 44 (*)    GFR calc Af Amer 51 (*)    All other components within normal limits  POC OCCULT BLOOD, ED - Abnormal; Notable for the following components:   Fecal Occult Bld POSITIVE (*)    All other components within normal limits  CBC WITH DIFFERENTIAL/PLATELET  PROTIME-INR  CBC WITH DIFFERENTIAL/PLATELET  TYPE AND SCREEN  ABO/RH  PREPARE RBC (CROSSMATCH)    EKG None  Radiology Dg Chest 2 View  Result Date: 12/21/2017 CLINICAL DATA:  Productive cough EXAM: CHEST - 2 VIEW COMPARISON:  08/02/2017, 07/28/2017 FINDINGS: Diffuse bronchitic changes. No focal consolidation or effusion. Stable cardiomediastinal silhouette. No pneumothorax. Biapical pleural and parenchymal scarring. Surgical changes in the right humeral head. IMPRESSION: 1. Findings suspicious for acute on chronic bronchitic changes. No focal pulmonary infiltrate is seen. Electronically Signed    By: Donavan Foil M.D.   On: 12/21/2017 18:21   Ct Abdomen Pelvis W Contrast  Result Date: 12/21/2017 CLINICAL DATA:  Pt st's he went to use the bathroom and had bright red bleeding coming from rectum. Pt is currently on blood thinners. Pt denies any abd pain or discomfort EXAM: CT ABDOMEN AND PELVIS WITH CONTRAST TECHNIQUE: Multidetector CT imaging of the abdomen and pelvis was performed using the standard protocol following bolus administration of intravenous contrast. CONTRAST:  75 mL OMNIPAQUE IOHEXOL 300 MG/ML  SOLN COMPARISON:  05/23/2015 FINDINGS: Lower chest: No acute abnormality. Hepatobiliary: No focal liver abnormality is seen. No gallstones, gallbladder wall thickening, or biliary dilatation. Pancreas: Unremarkable. No pancreatic ductal dilatation or surrounding inflammatory changes. Spleen: Normal in size without focal abnormality. Adrenals/Urinary Tract: No adrenal masses. Bilateral renal cortical thinning. Bilateral renal sinus cysts. No stones. No hydronephrosis. Symmetric renal enhancement and excretion. Normal ureters. 8 mm dependent stone in the bladder. Left posterior bladder diverticulum. Stone is larger than it was on the prior CT. Diverticulum is stable. Stomach/Bowel: Small hiatal hernia. Stomach otherwise unremarkable. Normal small bowel. There are numerous colonic diverticula predominantly along the sigmoid and descending colon. No evidence of diverticulitis. No colonic wall thickening or other inflammatory process. Vascular/Lymphatic: Aortic atherosclerosis. No enlarged abdominal or pelvic lymph nodes. Reproductive: Prostate normal in size. Other: No abdominal wall hernia or abnormality. No abdominopelvic ascites. Musculoskeletal: No fracture or acute finding. No osteoblastic or osteolytic lesions. IMPRESSION: 1. No acute findings within the abdomen or pelvis. 2. Multiple colonic diverticula most prominent along the sigmoid and lower descending colon. No evidence of diverticulitis 3.  Bilateral renal sinus cysts. 4. 8 mm dependent bladder stone. Small stable left posterior bladder diverticulum. 5. Aortic atherosclerosis. Electronically Signed   By: Lajean Manes M.D.   On: 12/21/2017 18:30    Procedures Procedures (including critical care time)  Medications Ordered in ED Medications  0.9 %  sodium chloride infusion (Manually program via Guardrails IV Fluids) (has no administration in time range)  iohexol (OMNIPAQUE) 300 MG/ML solution 100 mL (100 mLs Intravenous Contrast Given 12/21/17 1755)  sodium chloride 0.9 % bolus 1,000 mL (1,000 mLs Intravenous New Bag/Given 12/21/17  2138)     Initial Impression / Assessment and Plan / ED Course  I have reviewed the triage vital signs and the nursing notes.  Pertinent labs & imaging results that were available during my care of the patient were reviewed by me and considered in my medical decision making (see chart for details).     Daniel Herrera is a 77 y.o. male here with bright red blood per rectum. Likely lower GI bleed. Will get labs, CT ab/pel.   10:39 PM CT showed diverticulosis no diverticulitis. Hg 13. I called Dr. Oletta Lamas from GI to see patient. Will admit for lower GI bleed.     Final Clinical Impressions(s) / ED Diagnoses   Final diagnoses:  Lower GI bleed    ED Discharge Orders    None       Drenda Freeze, MD 12/21/17 2239

## 2017-12-21 NOTE — ED Notes (Signed)
Dr. Wouk at bedside 

## 2017-12-21 NOTE — ED Provider Notes (Signed)
Patient placed in Quick Look pathway, seen and evaluated   Chief Complaint: rectal bleeding  HPI:  Daniel Herrera is a 77 y.o. male who presents to the ED with sudden onset of lower abdominal cramping followed by rectal bleeding. Patient reports that before he could get to the bathroom he had blood running down his legs and all in his clothes. He reports cleaning up and putting on a depends and going back outside. While outside patient reports having another episode. Patient reports that he passed blood clots that were black. Patient also reports having a productive cough x 3 weeks with brown sputum. Patient currently taking Plavix.   ROS: GI: abdominal cramping  Rectal bleeding  Physical Exam:  BP (!) 142/88   Pulse 85   Temp 98.1 F (36.7 C) (Oral)   Resp 16   Ht 6' (1.829 m)   Wt 90.7 kg   SpO2 97%   BMI 27.12 kg/m    Gen: No distress  Neuro: Awake and Alert  Skin: Warm and dry  Abdomen: non tender on palpation    Initiation of care has begun. The patient has been counseled on the process, plan, and necessity for staying for the completion/evaluation, and the remainder of the medical screening examination    Ashley Murrain, NP 12/21/17 1629    Drenda Freeze, MD 12/21/17 [id number]

## 2017-12-21 NOTE — ED Triage Notes (Addendum)
Pt st's he went to use the bathroom and had bright red bleeding coming from rectum.  Pt is currently on blood thinners.  Pt denies any abd pain or discomfort

## 2017-12-22 ENCOUNTER — Other Ambulatory Visit: Payer: Self-pay

## 2017-12-22 DIAGNOSIS — I10 Essential (primary) hypertension: Secondary | ICD-10-CM

## 2017-12-22 DIAGNOSIS — K922 Gastrointestinal hemorrhage, unspecified: Secondary | ICD-10-CM

## 2017-12-22 DIAGNOSIS — N183 Chronic kidney disease, stage 3 (moderate): Secondary | ICD-10-CM

## 2017-12-22 DIAGNOSIS — T783XXA Angioneurotic edema, initial encounter: Secondary | ICD-10-CM

## 2017-12-22 LAB — BASIC METABOLIC PANEL
ANION GAP: 7 (ref 5–15)
BUN: 18 mg/dL (ref 8–23)
CHLORIDE: 112 mmol/L — AB (ref 98–111)
CO2: 22 mmol/L (ref 22–32)
CREATININE: 1.18 mg/dL (ref 0.61–1.24)
Calcium: 8.2 mg/dL — ABNORMAL LOW (ref 8.9–10.3)
GFR calc non Af Amer: 57 mL/min — ABNORMAL LOW (ref >60)
Glucose, Bld: 114 mg/dL — ABNORMAL HIGH (ref 70–99)
Potassium: 3.6 mmol/L (ref 3.5–5.1)
SODIUM: 141 mmol/L (ref 135–145)

## 2017-12-22 LAB — CBC
HEMATOCRIT: 34.8 % — AB (ref 39.0–52.0)
HEMOGLOBIN: 11.6 g/dL — AB (ref 13.0–17.0)
MCH: 31.3 pg (ref 26.0–34.0)
MCHC: 33.3 g/dL (ref 30.0–36.0)
MCV: 93.8 fL (ref 78.0–100.0)
PLATELETS: 152 10*3/uL (ref 150–400)
RBC: 3.71 MIL/uL — AB (ref 4.22–5.81)
RDW: 12.1 % (ref 11.5–15.5)
WBC: 3.7 10*3/uL — AB (ref 4.0–10.5)

## 2017-12-22 MED ORDER — LEVOTHYROXINE SODIUM 75 MCG PO TABS
75.0000 ug | ORAL_TABLET | Freq: Every day | ORAL | Status: DC
  Administered 2017-12-22 – 2017-12-23 (×2): 75 ug via ORAL
  Filled 2017-12-22 (×2): qty 1

## 2017-12-22 MED ORDER — PANTOPRAZOLE SODIUM 40 MG PO TBEC
40.0000 mg | DELAYED_RELEASE_TABLET | Freq: Two times a day (BID) | ORAL | Status: DC
  Administered 2017-12-22 – 2017-12-23 (×2): 40 mg via ORAL
  Filled 2017-12-22 (×3): qty 1

## 2017-12-22 MED ORDER — MONTELUKAST SODIUM 10 MG PO TABS
10.0000 mg | ORAL_TABLET | Freq: Every day | ORAL | Status: DC
  Administered 2017-12-22 (×2): 10 mg via ORAL
  Filled 2017-12-22 (×2): qty 1

## 2017-12-22 MED ORDER — POLYVINYL ALCOHOL 1.4 % OP SOLN
Freq: Two times a day (BID) | OPHTHALMIC | Status: DC
  Administered 2017-12-22 (×2): via OPHTHALMIC
  Administered 2017-12-22: 1 [drp] via OPHTHALMIC
  Administered 2017-12-23: 11:00:00 via OPHTHALMIC
  Filled 2017-12-22: qty 15

## 2017-12-22 MED ORDER — SODIUM CHLORIDE 0.9 % IV SOLN
INTRAVENOUS | Status: DC
  Administered 2017-12-22: 12:00:00 via INTRAVENOUS

## 2017-12-22 MED ORDER — METOPROLOL TARTRATE 25 MG PO TABS
25.0000 mg | ORAL_TABLET | Freq: Two times a day (BID) | ORAL | Status: DC
  Administered 2017-12-22 – 2017-12-23 (×2): 25 mg via ORAL
  Filled 2017-12-22 (×3): qty 1

## 2017-12-22 MED ORDER — FLUTICASONE PROPIONATE 50 MCG/ACT NA SUSP
1.0000 | Freq: Every day | NASAL | Status: DC
  Administered 2017-12-22 – 2017-12-23 (×3): 1 via NASAL
  Filled 2017-12-22: qty 16

## 2017-12-22 MED ORDER — SODIUM CHLORIDE 0.9 % IV SOLN
INTRAVENOUS | Status: DC
  Administered 2017-12-22: 01:00:00 via INTRAVENOUS

## 2017-12-22 MED ORDER — NITROGLYCERIN 0.4 MG SL SUBL: 0.4000 mg | SUBLINGUAL_TABLET | SUBLINGUAL | Status: DC | PRN

## 2017-12-22 MED ORDER — PANTOPRAZOLE SODIUM 40 MG IV SOLR
40.0000 mg | Freq: Two times a day (BID) | INTRAVENOUS | Status: DC
  Administered 2017-12-22 (×2): 40 mg via INTRAVENOUS
  Filled 2017-12-22 (×2): qty 40

## 2017-12-22 MED ORDER — FLUTICASONE PROPIONATE 50 MCG/ACT NA SUSP
1.0000 | Freq: Every day | NASAL | Status: DC
  Filled 2017-12-22: qty 16

## 2017-12-22 MED ORDER — AZELASTINE HCL 0.1 % NA SOLN
1.0000 | Freq: Two times a day (BID) | NASAL | Status: DC
  Administered 2017-12-22 – 2017-12-23 (×4): 1 via NASAL
  Filled 2017-12-22: qty 30

## 2017-12-22 NOTE — Care Management Obs Status (Signed)
[place] NOTIFICATION   Patient Details  Name: Daniel Herrera MRN: [id number] Date of Birth: 1941-01-26   Medicare Observation Status Notification Given:  Yes    Zenon Mayo, RN 12/22/2017, 12:35 PM

## 2017-12-22 NOTE — Care Management CC44 (Signed)
Condition Code 44 Documentation Completed  Patient Details  Name: Daniel Herrera MRN: [id number] Date of Birth: [birth year]   Condition Code 44 given:  Yes Patient signature on Condition Code 44 notice:  Yes Documentation of 2 MD's agreement:  Yes Code 44 added to claim:  Yes    Zenon Mayo, RN 12/22/2017, 12:35 PM

## 2017-12-22 NOTE — Progress Notes (Signed)
Daniel Herrera was admitted to 5w19 from the ED via stretcher.  The patient ambulated from the stretcher to the bed without incident.  The patient is alert and oriented x4.  Bed is in the lowest position, bed alarm activated.  Call bell and telephone are within reach.  Explained to patient how to use call bell and telephone, patient indicated understanding.  Admission booklet given.  Patient denies any further needs at this time.  Will continue to monitor.

## 2017-12-22 NOTE — Progress Notes (Signed)
@IPLOG @        PROGRESS NOTE                                                                                                                                                                                                             Patient Demographics:    Daniel Herrera, is a 77 y.o. male, DOB - March 20, 1941, VEL:[id number]  Admit date - 12/21/2017   Admitting Physician Gwynne Edinger, MD  Outpatient Primary MD for the patient is [name], NP  LOS - 1  Chief Complaint  Patient presents with  . Rectal Bleeding       Brief Narrative  Daniel Herrera is a 77 y.o. male with medical history significant for hypothyroid, prostate cancer s/p radiation and seeding, gout, cad/nstemi s/p DEX x2 2016, sCHF ef 45-50% 2016, hereditary angioedema, ckd, who presented with multiple episodes of bright red blood in stool ongoing for 6 to 10 hours.   Subjective:    Daniel Herrera today has, No headache, No chest pain, No abdominal pain - No Nausea, No new weakness tingling or numbness, No Cough - SOB.     Assessment  & Plan :     1.  Bright red blood per rectum.  This most likely is internal hemorrhoids or diverticular bleed, no significant drop in H&H at all, hemodynamically stable, placed on clear liquid diet and oral PPI, monitor H&H, no transfusion needed, continue to hold Plavix.  GI on board.  2.  History of CAD with DES several years ago.  Resume beta-blocker, Plavix currently on hold due to #1 above.  No acute issues.  3.  DM type II.  Diet controlled.  4.  Hereditary angioedema.  No acute issues, takes lanadelumab every 2 wks.     Family Communication  :  None  Code Status :  Full  Disposition Plan  :  Home in am  Consults  :  GI  Procedures  :    CT - 1. No acute findings within the abdomen or pelvis. 2. Multiple colonic diverticula most prominent along the sigmoid and lower descending colon. No evidence of diverticulitis 3. Bilateral renal sinus cysts. 4. 8 mm  dependent bladder stone. Small stable left posterior bladder diverticulum. 5. Aortic atherosclerosis  DVT Prophylaxis  :   SCDs    Lab Results  Component Value Date   PLT 152 12/22/2017    Diet :  Diet Order            Diet clear liquid  Room service appropriate? Yes; Fluid consistency: Thin  Diet effective now               Inpatient Medications Scheduled Meds: . azelastine  1 spray Each Nare BID  . fluticasone  1 spray Each Nare Daily  . levothyroxine  75 mcg Oral QAC breakfast  . metoprolol tartrate  25 mg Oral BID  . montelukast  10 mg Oral QHS  . pantoprazole  40 mg Oral BID  . polyvinyl alcohol   Both Eyes BID   Continuous Infusions: PRN Meds:.nitroGLYCERIN  Antibiotics  :   Anti-infectives (From admission, onward)   None          Objective:   Vitals:   12/21/17 2345 12/22/17 0023 12/22/17 0024 12/22/17 0603  BP: (!) 147/89 (!) 169/110 (!) 156/82 (!) 146/88  Pulse: 70 73 66 74  Resp: 11 19  18   Temp:  98.1 F (36.7 C)  97.6 F (36.4 C)  TempSrc:  Oral  Oral  SpO2: 100% 99% 100% 98%  Weight:   91.2 kg   Height:   6\' 1"  (1.854 m)     Wt Readings from Last 3 Encounters:  12/22/17 91.2 kg  12/12/17 92.9 kg  09/12/17 91.4 kg     Intake/Output Summary (Last 24 hours) at 12/22/2017 1205 Last data filed at 12/22/2017 0305 Gross per 24 hour  Intake 181.5 ml  Output -  Net 181.5 ml     Physical Exam  Awake Alert, Oriented X 3, No new F.N deficits, Normal affect [place].AT,PERRAL Supple Neck,No JVD, No cervical lymphadenopathy appriciated.  Symmetrical Chest wall movement, Good air movement bilaterally, CTAB RRR,No Gallops,Rubs or new Murmurs, No Parasternal Heave +ve B.Sounds, Abd Soft, No tenderness, No organomegaly appriciated, No rebound - guarding or rigidity. No Cyanosis, Clubbing or edema, No new Rash or bruise       Data Review:    CBC Recent Labs  Lab 12/21/17 1630 12/21/17 2131 12/22/17 0624  WBC 4.7 5.8 3.7*  HGB 13.6 14.1  11.6*  HCT 41.6 43.6 34.8*  PLT 176 197 152  MCV 96.3 96.2 93.8  MCH 31.5 31.1 31.3  MCHC 32.7 32.3 33.3  RDW 12.0 12.1 12.1  LYMPHSABS 0.8 1.2  --   MONOABS 0.4 0.6  --   EOSABS 0.1 0.1  --   BASOSABS 0.0 0.0  --     Chemistries  Recent Labs  Lab 12/21/17 1630 12/22/17 0624  NA 143 141  K 3.8 3.6  CL 112* 112*  CO2 21* 22  GLUCOSE 169* 114*  BUN 24* 18  CREATININE 1.46* 1.18  CALCIUM 8.8* 8.2*  AST 29  --   ALT 24  --   ALKPHOS 87  --   BILITOT 0.7  --    ------------------------------------------------------------------------------------------------------------------ No results for input(s): CHOL, HDL, LDLCALC, TRIG, CHOLHDL, LDLDIRECT in the last 72 hours.  Lab Results  Component Value Date   HGBA1C 6.4 (H) 05/24/2015   ------------------------------------------------------------------------------------------------------------------ No results for input(s): TSH, T4TOTAL, T3FREE, THYROIDAB in the last 72 hours.  Invalid input(s): FREET3 ------------------------------------------------------------------------------------------------------------------ No results for input(s): VITAMINB12, FOLATE, FERRITIN, TIBC, IRON, RETICCTPCT in the last 72 hours.  Coagulation profile Recent Labs  Lab 12/21/17 1630  INR 0.97    No results for input(s): DDIMER in the last 72 hours.  Cardiac Enzymes No results for input(s): CKMB, TROPONINI, MYOGLOBIN in the last 168 hours.  Invalid input(s): CK ------------------------------------------------------------------------------------------------------------------ No results found for: BNP  Micro Results No results found for  this or any previous visit (from the past 240 hour(s)).  Radiology Reports Dg Chest 2 View  Result Date: 12/21/2017 CLINICAL DATA:  Productive cough EXAM: CHEST - 2 VIEW COMPARISON:  08/02/2017, 07/28/2017 FINDINGS: Diffuse bronchitic changes. No focal consolidation or effusion. Stable cardiomediastinal  silhouette. No pneumothorax. Biapical pleural and parenchymal scarring. Surgical changes in the right humeral head. IMPRESSION: 1. Findings suspicious for acute on chronic bronchitic changes. No focal pulmonary infiltrate is seen. Electronically Signed   By: Donavan Foil M.D.   On: 12/21/2017 18:21   Ct Abdomen Pelvis W Contrast  Result Date: 12/21/2017 CLINICAL DATA:  Pt st's he went to use the bathroom and had bright red bleeding coming from rectum. Pt is currently on blood thinners. Pt denies any abd pain or discomfort EXAM: CT ABDOMEN AND PELVIS WITH CONTRAST TECHNIQUE: Multidetector CT imaging of the abdomen and pelvis was performed using the standard protocol following bolus administration of intravenous contrast. CONTRAST:  75 mL OMNIPAQUE IOHEXOL 300 MG/ML  SOLN COMPARISON:  05/23/2015 FINDINGS: Lower chest: No acute abnormality. Hepatobiliary: No focal liver abnormality is seen. No gallstones, gallbladder wall thickening, or biliary dilatation. Pancreas: Unremarkable. No pancreatic ductal dilatation or surrounding inflammatory changes. Spleen: Normal in size without focal abnormality. Adrenals/Urinary Tract: No adrenal masses. Bilateral renal cortical thinning. Bilateral renal sinus cysts. No stones. No hydronephrosis. Symmetric renal enhancement and excretion. Normal ureters. 8 mm dependent stone in the bladder. Left posterior bladder diverticulum. Stone is larger than it was on the prior CT. Diverticulum is stable. Stomach/Bowel: Small hiatal hernia. Stomach otherwise unremarkable. Normal small bowel. There are numerous colonic diverticula predominantly along the sigmoid and descending colon. No evidence of diverticulitis. No colonic wall thickening or other inflammatory process. Vascular/Lymphatic: Aortic atherosclerosis. No enlarged abdominal or pelvic lymph nodes. Reproductive: Prostate normal in size. Other: No abdominal wall hernia or abnormality. No abdominopelvic ascites. Musculoskeletal: No  fracture or acute finding. No osteoblastic or osteolytic lesions. IMPRESSION: 1. No acute findings within the abdomen or pelvis. 2. Multiple colonic diverticula most prominent along the sigmoid and lower descending colon. No evidence of diverticulitis 3. Bilateral renal sinus cysts. 4. 8 mm dependent bladder stone. Small stable left posterior bladder diverticulum. 5. Aortic atherosclerosis. Electronically Signed   By: Lajean Manes M.D.   On: 12/21/2017 18:30    Time Spent in minutes  30   Lala Lund M.D on 12/22/2017 at 12:05 PM  To page go to [email or web address] - password [email or web address]

## 2017-12-22 NOTE — Consult Note (Signed)
[facility] CONSULT Reason for consult: Hematochezia Referring Physician: [name], NP.  Primary GI: From [place].  Daniel Herrera is an 77 y.o. male.  HPI: He has a medical history significant for prostate cancer with prior radiation, history of CAD with history of congestive heart failure with ejection fraction 45 to 50%.  He apparently has had C. difficile in the past as well as previous ulcers.  He had a colonoscopy he states about 5 years ago down in [place] was told everything was okay.  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  He has stents in his heart and is on chronic clopidogrel.  He has had reflux in the ulcers and is on chronic famotidine.He also takes pantoprazole 40 mg twice daily given to him by Dr. Neldon Mc.  He takes chronic saccharomyces boulardii for prevention of C. difficile.  The patient's C1 esterase defect is treated with the medication calledTakzryo given to him by Dr. Neldon Mc He was referred from [facility] because of rectal bleeding 2 episodes of bright red blood.  He is on the Plavix but does not take any other blood thinners.  He has had no abdominal pain.  CT scan of the abdomen done in the emergency room last night showed significant diverticular disease of the left colonPatient's hemoglobin upon arrival in the emergency room was 13.6 and is down to 11.6 this morning he has had no further bleeding and has not had a transfusion.. Past Medical History:  Diagnosis Date  . Angio-edema   . Anxiety   . Arthritis   . CAD (coronary artery disease)    a. 02/2015: DES to mid-LAD  . GERD (gastroesophageal reflux disease)   . Gout   . High cholesterol   . History of prolonged Q-T interval on ECG   . Hx of Clostridium difficile infection   . Hx of  umbilical hernia repair   . Personal history of digestive disease    gastric ulcer  . Prostate cancer ([place])   . Prostate enlargement   . Skin cancer    basal cell carcinoma  . Thyroid disease   . Urticaria     Past Surgical History:  Procedure Laterality Date  . APPENDECTOMY    . BACK SURGERY    . CARDIAC CATHETERIZATION N/A 02/21/2015   Procedure: Left Heart Cath;  Surgeon: Lorretta Harp, MD;  Location: [facility];  Service: Cardiovascular;  Laterality: N/A;  . CATARACT EXTRACTION, BILATERAL    . NASAL SINUS SURGERY    . PROSTATE BIOPSY    . right shoulder rotator cuff repair      Family History  Problem Relation Age of Onset  . Cancer Mother   . Cancer Father        prostate  . Cancer Sister     Social History:  reports that he has never smoked. He has never used smokeless tobacco. He reports that he does not drink alcohol or use drugs.  Allergies:  Allergies  Allergen Reactions  . Lisinopril Swelling    Facial and lip swelling. And throat swelling.   . Benadryl [Diphenhydramine Hcl] Swelling    Facial swelling from high dose (tolerates Advil PM as needed)  . Darvocet [Propoxyphene N-Acetaminophen] Other (See Comments)    hallucination  . Darvon [Propoxyphene Hcl] Other (  See Comments)    Hallucination   . Tylenol [Acetaminophen] Swelling    Foot swelling    Medications; Prior to Admission medications   Medication Sig Start Date End Date Taking? Authorizing Provider  azelastine (ASTELIN) 0.1 % nasal spray Place 1 spray into both nostrils 2 (two) times daily. 12/12/17  Yes Kozlow, Donnamarie Poag, MD  clopidogrel (PLAVIX) 75 MG tablet TAKE 1 TABLET(75 MG) BY MOUTH DAILY Patient taking differently: Take 75 mg by mouth daily.  05/07/17  Yes Lorretta Harp, MD  EPINEPHrine 0.3 mg/0.3 mL IJ SOAJ injection Inject 0.3 mg into the muscle as needed (for allergic reaction).  08/21/17  Yes [provider]  famotidine (PEPCID) 40 MG tablet Take 1 tablet (40 mg  total) by mouth every evening. 12/12/17  Yes Kozlow, Donnamarie Poag, MD  fluticasone (FLONASE) 50 MCG/ACT nasal spray Place 1 spray into both nostrils daily. 12/12/17  Yes Kozlow, Donnamarie Poag, MD  levothyroxine (SYNTHROID, LEVOTHROID) 75 MCG tablet Take 75 mcg by mouth daily before breakfast.   Yes [provider]  metoprolol tartrate (LOPRESSOR) 25 MG tablet Take 1 tablet (25 mg total) by mouth 2 (two) times daily. 05/25/15  Yes Short, Noah Delaine, MD  montelukast (SINGULAIR) 10 MG tablet Take 1 tablet (10 mg total) by mouth at bedtime. 09/12/17  Yes Kozlow, Donnamarie Poag, MD  nitroGLYCERIN (NITROSTAT) 0.4 MG SL tablet DISSOLVE 1 TABLET UNDER TONGUE EVERY 5 MINUTES FOR 3 DOSES AS NEEDED CHEST PAIN. IF NO RELIEF CALL 911 Patient taking differently: Place 0.4 mg under the tongue every 5 (five) minutes as needed for chest pain.  03/03/15  Yes Simmons, Brittainy M, PA-C  pantoprazole (PROTONIX) 40 MG tablet Take 1 tablet (40 mg total) by mouth 2 (two) times daily. 12/12/17  Yes Kozlow, Donnamarie Poag, MD  Pitavastatin Calcium (LIVALO) 4 MG TABS Take 4 mg by mouth at bedtime.    Yes [provider]  Polyethyl Glycol-Propyl Glycol (SYSTANE OP) Place 1 drop into both eyes 2 (two) times daily.   Yes [provider]  saccharomyces boulardii (FLORASTOR) 250 MG capsule Take 1 capsule (250 mg total) by mouth 2 (two) times daily. Patient not taking: Reported on 12/21/2017 05/25/15   Janece Canterbury, MD   . sodium chloride   Intravenous Once  . azelastine  1 spray Each Nare BID  . fluticasone  1 spray Each Nare Daily  . levothyroxine  75 mcg Oral QAC breakfast  . montelukast  10 mg Oral QHS  . pantoprazole  40 mg Intravenous Q12H  . polyvinyl alcohol   Both Eyes BID   PRN Meds  Results for orders placed or performed during the hospital encounter of 12/21/17 (from the past 48 hour(s))  CBC with Differential/Platelet     Status: None   Collection Time: 12/21/17  4:30 PM  Result Value Ref Range   WBC 4.7 4.0 -  10.5 K/uL   RBC 4.32 4.22 - 5.81 MIL/uL   Hemoglobin 13.6 13.0 - 17.0 g/dL   HCT 41.6 39.0 - 52.0 %   MCV 96.3 78.0 - 100.0 fL   MCH 31.5 26.0 - 34.0 pg   MCHC 32.7 30.0 - 36.0 g/dL   RDW 12.0 11.5 - 15.5 %   Platelets 176 150 - 400 K/uL   Neutrophils Relative % 73 %   Neutro Abs 3.4 1.7 - 7.7 K/uL   Lymphocytes Relative 16 %   Lymphs Abs 0.8 0.7 - 4.0 K/uL   Monocytes Relative 9 %  Monocytes Absolute 0.4 0.1 - 1.0 K/uL   Eosinophils Relative 2 %   Eosinophils Absolute 0.1 0.0 - 0.7 K/uL   Basophils Relative 1 %   Basophils Absolute 0.0 0.0 - 0.1 K/uL   Immature Granulocytes 1 %   Abs Immature Granulocytes 0.0 0.0 - 0.1 K/uL    Comment: Performed at [facility] [address] [place], [place] [address]  Comprehensive metabolic panel     Status: Abnormal   Collection Time: 12/21/17  4:30 PM  Result Value Ref Range   Sodium 143 135 - 145 mmol/L   Potassium 3.8 3.5 - 5.1 mmol/L   Chloride 112 (H) 98 - 111 mmol/L   CO2 21 (L) 22 - 32 mmol/L   Glucose, Bld 169 (H) 70 - 99 mg/dL   BUN 24 (H) 8 - 23 mg/dL   Creatinine, Ser 1.46 (H) 0.61 - 1.24 mg/dL   Calcium 8.8 (L) 8.9 - 10.3 mg/dL   Total Protein 5.8 (L) 6.5 - 8.1 g/dL   Albumin 3.8 3.5 - 5.0 g/dL   AST 29 15 - 41 U/L   ALT 24 0 - 44 U/L   Alkaline Phosphatase 87 38 - 126 U/L   Total Bilirubin 0.7 0.3 - 1.2 mg/dL   GFR calc non Af Amer 44 (L) >60 mL/min   GFR calc Af Amer 51 (L) >60 mL/min    Comment: (NOTE) The eGFR has been calculated using the CKD EPI equation. This calculation has not been validated in all clinical situations. eGFR's persistently <60 mL/min signify possible Chronic Kidney Disease.    Anion gap 10 5 - 15    Comment: Performed at [facility] [address], [place], [place] [address]  Protime-INR     Status: None   Collection Time: 12/21/17  4:30 PM  Result Value Ref Range   Prothrombin Time 12.8 11.4 - 15.2 seconds   INR 0.97     Comment: Performed at [facility] [address] [place], [place] [address]  Type and screen [place]     Status: None (Preliminary result)   Collection Time: 12/21/17  4:30 PM  Result Value Ref Range   ABO/RH(D) O NEG    Antibody Screen NEG    Sample Expiration      12/24/2017 Performed at [facility], [place] [address], [place], [place] [address]    Unit Number [id number]    Blood Component Type RED CELLS,LR    Unit division 00    Status of Unit ALLOCATED    Transfusion Status OK TO TRANSFUSE    Crossmatch Result Compatible    Unit Number [id number]    Blood Component Type RBC LR PHER2    Unit division 00    Status of Unit ALLOCATED    Transfusion Status OK TO TRANSFUSE    Crossmatch Result Compatible   ABO/Rh     Status: None   Collection Time: 12/21/17  4:30 PM  Result Value Ref Range   ABO/RH(D)      O NEG Performed at [facility] [address], [place], [place] [address]   CBC with Differential/Platelet     Status: None   Collection Time: 12/21/17  9:31 PM  Result Value Ref Range   WBC 5.8 4.0 - 10.5 K/uL   RBC 4.53 4.22 - 5.81 MIL/uL   Hemoglobin 14.1 13.0 - 17.0 g/dL   HCT 43.6 39.0 - 52.0 %  MCV 96.2 78.0 - 100.0 fL   MCH 31.1 26.0 - 34.0 pg   MCHC 32.3 30.0 - 36.0 g/dL   RDW 12.1 11.5 - 15.5 %   Platelets 197 150 - 400 K/uL   Neutrophils Relative % 66 %   Neutro Abs 3.9 1.7 - 7.7 K/uL   Lymphocytes Relative 21 %   Lymphs Abs 1.2 0.7 - 4.0 K/uL   Monocytes Relative 10 %   Monocytes Absolute 0.6 0.1 - 1.0 K/uL   Eosinophils Relative 1 %   Eosinophils Absolute 0.1 0.0 - 0.7 K/uL   Basophils Relative 1 %   Basophils Absolute 0.0 0.0 - 0.1 K/uL   Immature Granulocytes 1 %   Abs Immature Granulocytes 0.0 0.0 - 0.1 K/uL    Comment: Performed at [facility] [address] [place], [place] [address]  POC occult blood, ED     Status: Abnormal   Collection Time: 12/21/17  9:38 PM  Result Value Ref Range   Fecal Occult Bld POSITIVE (A) NEGATIVE   Prepare RBC     Status: None   Collection Time: 12/21/17 10:43 PM  Result Value Ref Range   Order Confirmation      ORDER PROCESSED BY BLOOD BANK Performed at [facility], [place] [address] [place], [place] [address]   Basic metabolic panel     Status: Abnormal   Collection Time: 12/22/17  6:24 AM  Result Value Ref Range   Sodium 141 135 - 145 mmol/L   Potassium 3.6 3.5 - 5.1 mmol/L   Chloride 112 (H) 98 - 111 mmol/L   CO2 22 22 - 32 mmol/L   Glucose, Bld 114 (H) 70 - 99 mg/dL   BUN 18 8 - 23 mg/dL   Creatinine, Ser 1.18 0.61 - 1.24 mg/dL   Calcium 8.2 (L) 8.9 - 10.3 mg/dL   GFR calc non Af Amer 57 (L) >60 mL/min   GFR calc Af Amer >60 >60 mL/min    Comment: (NOTE) The eGFR has been calculated using the CKD EPI equation. This calculation has not been validated in all clinical situations. eGFR's persistently <60 mL/min signify possible Chronic Kidney Disease.    Anion gap 7 5 - 15    Comment: Performed at [facility] [address] [place], [place] [address]  CBC     Status: Abnormal   Collection Time: 12/22/17  6:24 AM  Result Value Ref Range   WBC 3.7 (L) 4.0 - 10.5 K/uL   RBC 3.71 (L) 4.22 - 5.81 MIL/uL   Hemoglobin 11.6 (L) 13.0 - 17.0 g/dL   HCT 34.8 (L) 39.0 - 52.0 %   MCV 93.8 78.0 - 100.0 fL   MCH 31.3 26.0 - 34.0 pg   MCHC 33.3 30.0 - 36.0 g/dL   RDW 12.1 11.5 - 15.5 %   Platelets 152 150 - 400 K/uL    Comment: Performed at [facility], [place] [address], [place], [place] [address]    Dg Chest 2 View  Result Date: 12/21/2017 CLINICAL DATA:  Productive cough EXAM: CHEST - 2 VIEW COMPARISON:  08/02/2017, 07/28/2017 FINDINGS: Diffuse bronchitic changes. No focal consolidation or effusion. Stable cardiomediastinal silhouette. No pneumothorax. Biapical pleural and parenchymal scarring. Surgical changes in the right humeral head. IMPRESSION: 1. Findings suspicious for acute on chronic bronchitic changes. No focal pulmonary infiltrate is seen.  Electronically Signed   By: Donavan Foil M.D.   On: 12/21/2017 18:21   Ct  Abdomen Pelvis W Contrast  Result Date: 12/21/2017 CLINICAL DATA:  Pt st's he went to use the bathroom and had bright red bleeding coming from rectum. Pt is currently on blood thinners. Pt denies any abd pain or discomfort EXAM: CT ABDOMEN AND PELVIS WITH CONTRAST TECHNIQUE: Multidetector CT imaging of the abdomen and pelvis was performed using the standard protocol following bolus administration of intravenous contrast. CONTRAST:  75 mL OMNIPAQUE IOHEXOL 300 MG/ML  SOLN COMPARISON:  05/23/2015 FINDINGS: Lower chest: No acute abnormality. Hepatobiliary: No focal liver abnormality is seen. No gallstones, gallbladder wall thickening, or biliary dilatation. Pancreas: Unremarkable. No pancreatic ductal dilatation or surrounding inflammatory changes. Spleen: Normal in size without focal abnormality. Adrenals/Urinary Tract: No adrenal masses. Bilateral renal cortical thinning. Bilateral renal sinus cysts. No stones. No hydronephrosis. Symmetric renal enhancement and excretion. Normal ureters. 8 mm dependent stone in the bladder. Left posterior bladder diverticulum. Stone is larger than it was on the prior CT. Diverticulum is stable. Stomach/Bowel: Small hiatal hernia. Stomach otherwise unremarkable. Normal small bowel. There are numerous colonic diverticula predominantly along the sigmoid and descending colon. No evidence of diverticulitis. No colonic wall thickening or other inflammatory process. Vascular/Lymphatic: Aortic atherosclerosis. No enlarged abdominal or pelvic lymph nodes. Reproductive: Prostate normal in size. Other: No abdominal wall hernia or abnormality. No abdominopelvic ascites. Musculoskeletal: No fracture or acute finding. No osteoblastic or osteolytic lesions. IMPRESSION: 1. No acute findings within the abdomen or pelvis. 2. Multiple colonic diverticula most prominent along the sigmoid and lower descending colon. No  evidence of diverticulitis 3. Bilateral renal sinus cysts. 4. 8 mm dependent bladder stone. Small stable left posterior bladder diverticulum. 5. Aortic atherosclerosis. Electronically Signed   By: Lajean Manes M.D.   On: 12/21/2017 18:30               Blood pressure (!) 146/88, pulse 74, temperature 97.6 F (36.4 C), temperature source Oral, resp. rate 18, height _0  (1.854 m), weight 91.2 kg, SpO2 98 %.  Physical exam:   General--white male in no acute distress ENT--nonicteric no signs in the throat or neck of angioedema Neck--no signs of angioedema Heart--regular rate and rhythm without murmurs or gallops Lungs--clear Abdomen--soft and nontender Psych--alert and oriented answers questions appropriately   Assessment: 1.  Hematochezia.  This is almost certainly due to hemorrhoids or more likely diverticulosis.  His hemoglobin is relatively stable.  He did have a colonoscopy about 5 years ago and reports that this was okay. 2.  History of hereditary angioedema.  He has had significant problems with airway compromise but seems to be doing fine now. 3.  CAD with history of MI 4.  Diabetes type 2  Plan: We will go ahead and give him clear liquids.  He appears quite stable on I would not do another colonoscopy given the fact that he just had one 5 years ago and that his hemoglobin is stable.  Should he drop the hemoglobin much we could always cleaning out and do another:.  We will follow him with you.   Nancy Fetter 12/22/2017, 8:33 AM   This note was created using voice recognition software and minor errors may Have occurred unintentionally. Pager: [phone] If no answer or after hours call [phone]

## 2017-12-23 DIAGNOSIS — K922 Gastrointestinal hemorrhage, unspecified: Secondary | ICD-10-CM | POA: Diagnosis not present

## 2017-12-23 LAB — TYPE AND SCREEN
ABO/RH(D): O NEG
ANTIBODY SCREEN: NEGATIVE
Unit division: 0
Unit division: 0

## 2017-12-23 LAB — BPAM RBC
BLOOD PRODUCT EXPIRATION DATE: 201910192359
Blood Product Expiration Date: 201910192359
ISSUE DATE / TIME: 201909211105
UNIT TYPE AND RH: 9500
Unit Type and Rh: 9500

## 2017-12-23 LAB — CBC
HEMATOCRIT: 34.2 % — AB (ref 39.0–52.0)
HEMOGLOBIN: 11.6 g/dL — AB (ref 13.0–17.0)
MCH: 31.8 pg (ref 26.0–34.0)
MCHC: 33.9 g/dL (ref 30.0–36.0)
MCV: 93.7 fL (ref 78.0–100.0)
Platelets: 154 10*3/uL (ref 150–400)
RBC: 3.65 MIL/uL — ABNORMAL LOW (ref 4.22–5.81)
RDW: 12 % (ref 11.5–15.5)
WBC: 3.2 10*3/uL — ABNORMAL LOW (ref 4.0–10.5)

## 2017-12-23 MED ORDER — CETIRIZINE HCL 10 MG PO CAPS: 1.0000 | ORAL_CAPSULE | Freq: Every evening | ORAL | 0 refills | Status: DC | PRN

## 2017-12-23 MED ORDER — CLOPIDOGREL BISULFATE 75 MG PO TABS: 75.0000 mg | ORAL_TABLET | Freq: Every day | ORAL | Status: DC

## 2017-12-23 MED ORDER — POLYETHYLENE GLYCOL 3350 17 G PO PACK: 17.0000 g | PACK | Freq: Every day | ORAL | 0 refills | Status: DC

## 2017-12-23 MED ORDER — DOCUSATE SODIUM 100 MG PO CAPS: 100.0000 mg | ORAL_CAPSULE | Freq: Every day | ORAL | 0 refills | Status: AC

## 2017-12-23 MED ORDER — PANTOPRAZOLE SODIUM 40 MG PO TBEC: 40.0000 mg | DELAYED_RELEASE_TABLET | Freq: Two times a day (BID) | ORAL | 0 refills | Status: DC

## 2017-12-23 NOTE — Discharge Instructions (Signed)
Follow with Primary MD Manson Allan, [name], NP in 3 days   Get CBC, CMP, by Primary MD or SNF MD in 3 days    Activity: As tolerated with Full fall precautions use walker/cane & assistance as needed  Disposition Home    Diet: Heart Healthy    For Heart failure patients - Check your Weight same time everyday, if you gain over 2 pounds, or you develop in leg swelling, experience more shortness of breath or chest pain, call your Primary MD immediately. Follow Cardiac Low Salt Diet and 1.5 lit/day fluid restriction.  Special Instructions: If you have smoked or chewed Tobacco  in the last 2 yrs please stop smoking, stop any regular Alcohol  and or any Recreational drug use.  On your next visit with your primary care physician please Get Medicines reviewed and adjusted.  Please request your Prim.MD to go over all Hospital Tests and Procedure/Radiological results at the follow up, please get all Hospital records sent to your Prim MD by signing hospital release before you go home.  If you experience worsening of your admission symptoms, develop shortness of breath, life threatening emergency, suicidal or homicidal thoughts you must seek medical attention immediately by calling 911 or calling your MD immediately  if symptoms less severe.  You Must read complete instructions/literature along with all the possible adverse reactions/side effects for all the Medicines you take and that have been prescribed to you. Take any new Medicines after you have completely understood and accpet all the possible adverse reactions/side effects.

## 2017-12-23 NOTE — Discharge Summary (Signed)
Daniel Herrera EQA:[id number] DOB: 09-Oct-1940 DOA: 12/21/2017  PCP: [name], NP  Admit date: 12/21/2017  Discharge date: 12/24/2017  Admitted From: Home   Disposition:  Home   Recommendations for Outpatient Follow-up:   Follow up with PCP in 1-2 weeks  PCP Please obtain BMP/CBC, 2 view CXR in 1week,  (see Discharge instructions)   PCP Please follow up on the following pending results:    Home Health: None   Equipment/Devices: None  Consultations: GI Discharge Condition: Stable   CODE STATUS: Full   Diet Recommendation: Heart Healthy   Diet Order            Diet - low sodium heart healthy               Chief Complaint  Patient presents with  . Rectal Bleeding     Brief history of present illness from the day of admission and additional interim summary    Daniel C Montgomeryis a 77 y.o.malewith medical history significant forhypothyroid, prostate cancer s/p radiation and seeding, gout, cad/nstemi s/p DEX x2 2016, sCHF ef 45-50% 2016, hereditary angioedema, ckd, who presented with multiple episodes of bright red blood in stool ongoing for 6 to 10 hours.                                                                 Hospital Course      1.  Bright red blood per rectum.  This most likely is internal hemorrhoids or diverticular bleed, no significant drop in H&H at all, hemodynamically stable, seen by GI, H&H remained stable without requiring any transfusions, no further bleeding will be discharged home with Lasix to be held for a week from the time of discharge, case discussed with GI physician Dr. Oletta Lamas, patient will be placed on stool softeners and discharged home with outpatient PCP follow-up and monitoring.  2.  History of CAD with DES several years ago.  Resume beta-blocker, Plavix  currently on hold due to #1 above.  No acute issues.  3.  DM type II.  Diet controlled.  4.  Hereditary angioedema.  No acute issues, takes lanadelumab every 2 wks.    Discharge diagnosis     Active Problems:   NSTEMI (non-ST elevated myocardial infarction) [facility])   Essential hypertension   Coronary artery disease   Type 2 diabetes mellitus with unspecified complications (HCC)   Angioedema   Hematochezia   CKD (chronic kidney disease) stage 3, GFR 30-59 ml/min (HCC)   GI bleed   Lower GI bleed    Discharge instructions    Discharge Instructions    Diet - low sodium heart healthy   Complete by:  As directed    Discharge instructions   Complete by:  As directed    Follow with Primary  MD Manson Allan, [name], NP in 3 days   Get CBC, CMP, by Primary MD or SNF MD in 3 days    Activity: As tolerated with Full fall precautions use walker/cane & assistance as needed  Disposition Home    Diet: Heart Healthy    For Heart failure patients - Check your Weight same time everyday, if you gain over 2 pounds, or you develop in leg swelling, experience more shortness of breath or chest pain, call your Primary MD immediately. Follow Cardiac Low Salt Diet and 1.5 lit/day fluid restriction.  Special Instructions: If you have smoked or chewed Tobacco  in the last 2 yrs please stop smoking, stop any regular Alcohol  and or any Recreational drug use.  On your next visit with your primary care physician please Get Medicines reviewed and adjusted.  Please request your Prim.MD to go over all Hospital Tests and Procedure/Radiological results at the follow up, please get all Hospital records sent to your Prim MD by signing hospital release before you go home.  If you experience worsening of your admission symptoms, develop shortness of breath, life threatening emergency, suicidal or homicidal thoughts you must seek medical attention immediately by calling 911 or calling your MD immediately  if symptoms  less severe.  You Must read complete instructions/literature along with all the possible adverse reactions/side effects for all the Medicines you take and that have been prescribed to you. Take any new Medicines after you have completely understood and accpet all the possible adverse reactions/side effects.   Increase activity slowly   Complete by:  As directed       Discharge Medications   Allergies as of 12/23/2017      Reactions   Lisinopril Swelling   Facial and lip swelling. And throat swelling.    Benadryl [diphenhydramine Hcl] Swelling   Facial swelling from high dose (tolerates Advil PM as needed)   Darvocet [propoxyphene N-acetaminophen] Other (See Comments)   hallucination   Darvon [propoxyphene Hcl] Other (See Comments)   Hallucination   Tylenol [acetaminophen] Swelling   Foot swelling      Medication List    STOP taking these medications   famotidine 40 MG tablet Commonly known as:  PEPCID     TAKE these medications   azelastine 0.1 % nasal spray Commonly known as:  ASTELIN Place 1 spray into both nostrils 2 (two) times daily.   Cetirizine HCl 10 MG Caps Take 1 capsule (10 mg total) by mouth at bedtime and may repeat dose one time if needed.   clopidogrel 75 MG tablet Commonly known as:  PLAVIX Take 1 tablet (75 mg total) by mouth daily. Start taking on:  12/26/2017 What changed:    See the new instructions.  These instructions start on 12/26/2017. If you are unsure what to do until then, ask your doctor or other care provider.   docusate sodium 100 MG capsule Commonly known as:  COLACE Take 1 capsule (100 mg total) by mouth daily.   EPINEPHrine 0.3 mg/0.3 mL Soaj injection Commonly known as:  EPI-PEN Inject 0.3 mg into the muscle as needed (for allergic reaction).   fluticasone 50 MCG/ACT nasal spray Commonly known as:  FLONASE Place 1 spray into both nostrils daily.   levothyroxine 75 MCG tablet Commonly known as:  SYNTHROID, LEVOTHROID Take  75 mcg by mouth daily before breakfast.   LIVALO 4 MG Tabs Generic drug:  Pitavastatin Calcium Take 4 mg by mouth at bedtime.   metoprolol tartrate 25  MG tablet Commonly known as:  LOPRESSOR Take 1 tablet (25 mg total) by mouth 2 (two) times daily.   montelukast 10 MG tablet Commonly known as:  SINGULAIR Take 1 tablet (10 mg total) by mouth at bedtime.   nitroGLYCERIN 0.4 MG SL tablet Commonly known as:  NITROSTAT DISSOLVE 1 TABLET UNDER TONGUE EVERY 5 MINUTES FOR 3 DOSES AS NEEDED CHEST PAIN. IF NO RELIEF CALL 911 What changed:  See the new instructions.   pantoprazole 40 MG tablet Commonly known as:  PROTONIX Take 1 tablet (40 mg total) by mouth 2 (two) times daily.   polyethylene glycol packet Commonly known as:  MIRALAX / GLYCOLAX Take 17 g by mouth daily.   saccharomyces boulardii 250 MG capsule Commonly known as:  FLORASTOR Take 1 capsule (250 mg total) by mouth 2 (two) times daily.   SYSTANE OP Place 1 drop into both eyes 2 (two) times daily.       Follow-up Information    [name], [name], NP. Schedule an appointment as soon as possible for a visit in 3 day(s).   Specialty:  Internal Medicine Why:  and your GI MD of choice in 1 week Contact information: [place] [phone]           Major procedures and Radiology Reports - PLEASE review detailed and final reports thoroughly  -        Dg Chest 2 View  Result Date: 12/21/2017 CLINICAL DATA:  Productive cough EXAM: CHEST - 2 VIEW COMPARISON:  08/02/2017, 07/28/2017 FINDINGS: Diffuse bronchitic changes. No focal consolidation or effusion. Stable cardiomediastinal silhouette. No pneumothorax. Biapical pleural and parenchymal scarring. Surgical changes in the right humeral head. IMPRESSION: 1. Findings suspicious for acute on chronic bronchitic changes. No focal pulmonary infiltrate is seen. Electronically Signed   By: Donavan Foil M.D.   On: 12/21/2017 18:21   Ct  Abdomen Pelvis W Contrast  Result Date: 12/21/2017 CLINICAL DATA:  Pt st's he went to use the bathroom and had bright red bleeding coming from rectum. Pt is currently on blood thinners. Pt denies any abd pain or discomfort EXAM: CT ABDOMEN AND PELVIS WITH CONTRAST TECHNIQUE: Multidetector CT imaging of the abdomen and pelvis was performed using the standard protocol following bolus administration of intravenous contrast. CONTRAST:  75 mL OMNIPAQUE IOHEXOL 300 MG/ML  SOLN COMPARISON:  05/23/2015 FINDINGS: Lower chest: No acute abnormality. Hepatobiliary: No focal liver abnormality is seen. No gallstones, gallbladder wall thickening, or biliary dilatation. Pancreas: Unremarkable. No pancreatic ductal dilatation or surrounding inflammatory changes. Spleen: Normal in size without focal abnormality. Adrenals/Urinary Tract: No adrenal masses. Bilateral renal cortical thinning. Bilateral renal sinus cysts. No stones. No hydronephrosis. Symmetric renal enhancement and excretion. Normal ureters. 8 mm dependent stone in the bladder. Left posterior bladder diverticulum. Stone is larger than it was on the prior CT. Diverticulum is stable. Stomach/Bowel: Small hiatal hernia. Stomach otherwise unremarkable. Normal small bowel. There are numerous colonic diverticula predominantly along the sigmoid and descending colon. No evidence of diverticulitis. No colonic wall thickening or other inflammatory process. Vascular/Lymphatic: Aortic atherosclerosis. No enlarged abdominal or pelvic lymph nodes. Reproductive: Prostate normal in size. Other: No abdominal wall hernia or abnormality. No abdominopelvic ascites. Musculoskeletal: No fracture or acute finding. No osteoblastic or osteolytic lesions. IMPRESSION: 1. No acute findings within the abdomen or pelvis. 2. Multiple colonic diverticula most prominent along the sigmoid and lower descending colon. No evidence of diverticulitis 3. Bilateral renal sinus cysts. 4. 8  mm dependent bladder  stone. Small stable left posterior bladder diverticulum. 5. Aortic atherosclerosis. Electronically Signed   By: Lajean Manes M.D.   On: 12/21/2017 18:30    Micro Results    No results found for this or any previous visit (from the past 240 hour(s)).  Today   Subjective    Daniel Herrera today has no headache,no chest abdominal pain,no new weakness tingling or numbness, feels much better wants to go home today.    Objective   Blood pressure 117/83, pulse 67, temperature 98 F (36.7 C), temperature source Oral, resp. rate 18, height 6\' 1"  (1.854 m), weight 91.2 kg, SpO2 98 %.  No intake or output data in the 24 hours ending 12/24/17 1403  Exam Awake Alert, Oriented x 3, No new F.N deficits, Normal affect [place].AT,PERRAL Supple Neck,No JVD, No cervical lymphadenopathy appriciated.  Symmetrical Chest wall movement, Good air movement bilaterally, CTAB RRR,No Gallops,Rubs or new Murmurs, No Parasternal Heave +ve B.Sounds, Abd Soft, Non tender, No organomegaly appriciated, No rebound -guarding or rigidity. No Cyanosis, Clubbing or edema, No new Rash or bruise   Data Review   CBC w Diff:  Lab Results  Component Value Date   WBC 3.2 (L) 12/23/2017   HGB 11.6 (L) 12/23/2017   HGB 15.4 09/12/2017   HCT 34.2 (L) 12/23/2017   HCT 46.3 09/12/2017   PLT 154 12/23/2017   PLT 172 09/12/2017   LYMPHOPCT 21 12/21/2017   MONOPCT 10 12/21/2017   EOSPCT 1 12/21/2017   BASOPCT 1 12/21/2017    CMP:  Lab Results  Component Value Date   NA 141 12/22/2017   K 3.6 12/22/2017   CL 112 (H) 12/22/2017   CO2 22 12/22/2017   BUN 18 12/22/2017   CREATININE 1.18 12/22/2017   CREATININE 1.29 (H) 04/02/2015   PROT 5.8 (L) 12/21/2017   PROT 6.4 09/12/2017   ALBUMIN 3.8 12/21/2017   BILITOT 0.7 12/21/2017   ALKPHOS 87 12/21/2017   AST 29 12/21/2017   ALT 24 12/21/2017  .   Total Time in preparing paper work, data evaluation and todays exam - 77 minutes  Lala Lund M.D on 12/24/2017  at 2:03 PM  [facility]   Office  [phone]

## 2017-12-25 DIAGNOSIS — Z79899 Other long term (current) drug therapy: Secondary | ICD-10-CM | POA: Diagnosis not present

## 2017-12-25 DIAGNOSIS — E538 Deficiency of other specified B group vitamins: Secondary | ICD-10-CM | POA: Diagnosis not present

## 2017-12-25 DIAGNOSIS — K625 Hemorrhage of anus and rectum: Secondary | ICD-10-CM | POA: Diagnosis not present

## 2017-12-25 DIAGNOSIS — J069 Acute upper respiratory infection, unspecified: Secondary | ICD-10-CM | POA: Diagnosis not present

## 2017-12-25 DIAGNOSIS — Z6827 Body mass index (BMI) 27.0-27.9, adult: Secondary | ICD-10-CM | POA: Diagnosis not present

## 2017-12-31 ENCOUNTER — Telehealth: Payer: Self-pay | Admitting: Cardiovascular Disease

## 2017-12-31 NOTE — Telephone Encounter (Signed)
New Message:     Patient stated he was told to stop taking the medication: clopidogrel (PLAVIX) 75 MG tablet   He has a questions if he should stop and if he should schedule a sooner appointment

## 2017-12-31 NOTE — Telephone Encounter (Signed)
Returned pt call.[email or web address] 

## 2018-01-01 NOTE — Telephone Encounter (Signed)
Spoke with pt who states he was recently discharged from the hospital on 12/23/17 for a GI and instructed to stop Plavix until his is seen by his Cardiologist. Pt reports he has not had anymore bleeding and is scheduled to see Dr. Gwenlyn Found on 02/19/18. Appointment moved up to tomorrow 01/02/18 at 8 am with Kerin Ransom, PA for follow up.

## 2018-01-01 NOTE — Telephone Encounter (Signed)
Called no answer , left message to call back   patient recently in hospital for gi bleed. See d/c note

## 2018-01-02 ENCOUNTER — Ambulatory Visit (INDEPENDENT_AMBULATORY_CARE_PROVIDER_SITE_OTHER): Payer: Medicare Other | Admitting: Cardiology

## 2018-01-02 ENCOUNTER — Encounter: Payer: Self-pay | Admitting: Cardiology

## 2018-01-02 VITALS — BP 124/72 | HR 72 | Ht 73.0 in | Wt 205.0 lb

## 2018-01-02 DIAGNOSIS — K922 Gastrointestinal hemorrhage, unspecified: Secondary | ICD-10-CM

## 2018-01-02 DIAGNOSIS — Z9861 Coronary angioplasty status: Secondary | ICD-10-CM | POA: Diagnosis not present

## 2018-01-02 DIAGNOSIS — E785 Hyperlipidemia, unspecified: Secondary | ICD-10-CM

## 2018-01-02 DIAGNOSIS — N183 Chronic kidney disease, stage 3 unspecified: Secondary | ICD-10-CM

## 2018-01-02 DIAGNOSIS — I2510 Atherosclerotic heart disease of native coronary artery without angina pectoris: Secondary | ICD-10-CM | POA: Diagnosis not present

## 2018-01-02 DIAGNOSIS — Z01818 Encounter for other preprocedural examination: Secondary | ICD-10-CM

## 2018-01-02 DIAGNOSIS — I10 Essential (primary) hypertension: Secondary | ICD-10-CM | POA: Diagnosis not present

## 2018-01-02 DIAGNOSIS — K921 Melena: Secondary | ICD-10-CM | POA: Diagnosis not present

## 2018-01-02 DIAGNOSIS — I252 Old myocardial infarction: Secondary | ICD-10-CM | POA: Diagnosis not present

## 2018-01-02 HISTORY — DX: Encounter for other preprocedural examination: Z01.818

## 2018-01-02 MED ORDER — ASPIRIN EC 81 MG PO TBEC: 81.0000 mg | DELAYED_RELEASE_TABLET | Freq: Every day | ORAL | 3 refills | Status: DC

## 2018-01-02 NOTE — Assessment & Plan Note (Signed)
NSTEMI treated with LAD PCI with DES x 03 Feb 2015 Myoview low risk Nov 2017. Pt is active- no recent chest pain

## 2018-01-02 NOTE — Assessment & Plan Note (Signed)
Admitted Sept 2019 with lower GI bleeding felt to be secondary to hemorrhoids

## 2018-01-02 NOTE — Assessment & Plan Note (Signed)
Controlled.  

## 2018-01-02 NOTE — Progress Notes (Signed)
01/02/2018 Daniel Herrera   1941/02/27  [id number]  Primary Physician [name], NP Primary Cardiologist: Dr Gwenlyn Found  HPI:  Daniel Herrera 77 y/o male, [place] in [place], followed by Dr Gwenlyn Found with a history of NSTEMI Nov 2016 treated with LADS DES x 2. He had residual moderate CFX disease and an EF of 45-50%. His LOV was Nov 2018. He was hospitalized this September for lower GI bleeding felt to be secondary to hemorrhoids. Since then his ASA and Plavix have been stopped.  He has done well since discharge- no bleeding. He will need to have a colonoscopy with Dr Lyndel Safe. The patient asked if he could stay off his Plavix. He has issues with easy bruising as well as suspected hemorrhoidal bleeding. He says he doesn't tolerate ASA well but is agreeable to take a coated baby ASA daily. From a cardiac standpoint he has done well, he denies any chest pain. 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    Current Outpatient Medications  Medication Sig Dispense Refill  . azelastine (ASTELIN) 0.1 % nasal spray Place 1 spray into both nostrils 2 (two) times daily. 30 mL 5  . Cetirizine HCl (ZYRTEC ALLERGY) 10 MG CAPS Take 1 capsule (10 mg total) by mouth at bedtime and may repeat dose one time if needed. 30 capsule 0  . docusate sodium (COLACE) 100 MG capsule Take 1 capsule (100 mg total) by mouth daily. 30 capsule 0  . EPINEPHrine 0.3 mg/0.3 mL IJ SOAJ injection Inject 0.3 mg into the muscle as needed (for allergic reaction).   0  . fluticasone (FLONASE) 50 MCG/ACT nasal spray Place 1 spray into both nostrils daily. 16 g 4  . levothyroxine (SYNTHROID, LEVOTHROID) 75 MCG tablet Take 75 mcg by mouth daily before breakfast.    . metoprolol tartrate (LOPRESSOR) 25 MG tablet Take 1 tablet (25 mg total) by mouth 2 (two) times daily. 90 tablet 6  . montelukast (SINGULAIR) 10 MG tablet Take 1 tablet (10 mg total) by mouth at bedtime. 30 tablet 5  . nitroGLYCERIN (NITROSTAT) 0.4 MG SL  tablet DISSOLVE 1 TABLET UNDER TONGUE EVERY 5 MINUTES FOR 3 DOSES AS NEEDED CHEST PAIN. IF NO RELIEF CALL 911 75 tablet 2  . pantoprazole (PROTONIX) 40 MG tablet Take 1 tablet (40 mg total) by mouth 2 (two) times daily. 60 tablet 0  . Pitavastatin Calcium (LIVALO) 4 MG TABS Take 4 mg by mouth at bedtime.     Vladimir Faster Glycol-Propyl Glycol (SYSTANE OP) Place 1 drop into both eyes 2 (two) times daily.    . polyethylene glycol (MIRALAX) packet Take 17 g by mouth daily. 28 each 0  . aspirin EC 81 MG tablet Take 1 tablet (81 mg total) by mouth daily. 90 tablet 3   No current facility-administered medications for this visit.     Allergies  Allergen Reactions  . Lisinopril Swelling    Facial and lip swelling. And throat swelling.   . Benadryl [Diphenhydramine Hcl] Swelling    Facial swelling from high dose (tolerates Advil PM as needed)  . Darvocet [Propoxyphene N-Acetaminophen] Other (See Comments)    hallucination  . Darvon [Propoxyphene Hcl] Other (See Comments)    Hallucination   . Tylenol [Acetaminophen] Swelling    Foot swelling    Past Medical History:  Diagnosis Date  . Angio-edema   . Anxiety   . Arthritis   . CAD (coronary artery disease)  a. 02/2015: DES to mid-LAD  . GERD (gastroesophageal reflux disease)   . Gout   . High cholesterol   . History of prolonged Q-T interval on ECG   . Hx of Clostridium difficile infection   . Hx of umbilical hernia repair   . Personal history of digestive disease    gastric ulcer  . Prostate cancer ([place])   . Prostate enlargement   . Skin cancer    basal cell carcinoma  . Thyroid disease   . Urticaria     Social History   Socioeconomic History  . Marital status: Married    Spouse name: Not on file  . Number of children: Not on file  . Years of education: Not on file  . Highest education level: Not on file  Occupational History  . Occupation: retired  Scientific laboratory technician  . Financial resource strain: Not on file  . Food  insecurity:    Worry: Not on file    Inability: Not on file  . Transportation needs:    Medical: Not on file    Non-medical: Not on file  Tobacco Use  . Smoking status: Never Smoker  . Smokeless tobacco: Never Used  Substance and Sexual Activity  . Alcohol use: No  . Drug use: No  . Sexual activity: Not on file  Lifestyle  . Physical activity:    Days per week: Not on file    Minutes per session: Not on file  . Stress: Not on file  Relationships  . Social connections:    Talks on phone: Not on file    Gets together: Not on file    Attends religious service: Not on file    Active member of club or organization: Not on file    Attends meetings of clubs or organizations: Not on file    Relationship status: Not on file  . Intimate partner violence:    Fear of current or ex partner: Not on file    Emotionally abused: Not on file    Physically abused: Not on file    Forced sexual activity: Not on file  Other Topics Concern  . Not on file  Social History Narrative  . Not on file     Family History  Problem Relation Age of Onset  . Cancer Mother   . Cancer Father        prostate  . Cancer Sister      Review of Systems: General: negative for chills, fever, night sweats or weight changes.  Cardiovascular: negative for chest pain, dyspnea on exertion, edema, orthopnea, palpitations, paroxysmal nocturnal dyspnea or shortness of breath Dermatological: negative for rash Respiratory: negative for cough or wheezing Urologic: negative for hematuria Abdominal: negative for nausea, vomiting, diarrhea, bright red blood per rectum, melena, or hematemesis Neurologic: negative for visual changes, syncope, or dizziness All other systems reviewed and are otherwise negative except as noted above.    Blood pressure 124/72, pulse 72, height 6\' 1"  (1.854 m), weight 205 lb (93 kg), SpO2 98 %.  General appearance: alert, cooperative and no distress Neck: no carotid bruit and no  JVD Lungs: clear to auscultation bilaterally Heart: regular rate and rhythm Extremities: no edema Skin: warm and dry Neurologic: Grossly normal  EKG NSR, SB 57  ASSESSMENT AND PLAN:   Hematochezia Admitted Sept 2019 with lower GI bleeding felt to be secondary to hemorrhoids  CAD S/P percutaneous coronary angioplasty NSTEMI treated with LAD PCI with DES x 03 Feb 2015 Myoview low  risk Nov 2017. Pt is active- no recent chest pain  CKD (chronic kidney disease) stage 3, GFR 30-59 ml/min (HCC) GFR 57  Essential hypertension Controlled  Dyslipidemia, goal LDL below 70 On Livalo- followed by PCP  Pre-operative clearance Cleared from cardiac standpoint for colonoscopy   PLAN  OK to stop ASA.  Cleared for colonoscopy. He is agreeable to try a baby ASA daily, I stressed the importance of being on either an ASA or Plavix. F/U Dr Gwenlyn Found in one year.  Kerin Ransom PA-C 01/02/2018 8:26 AM

## 2018-01-02 NOTE — Patient Instructions (Addendum)
Medication Instructions:  Start Aspirin 81 mg daily  Follow-Up: Your physician wants you to follow-up in: 12 months with Dr. Gwenlyn Found. You will receive a reminder letter in the mail two months in advance. If you don't receive a letter, please call our office to schedule the follow-up appointment.   Any Other Special Instructions Will Be Listed Below (If Applicable).     If you need a refill on your cardiac medications before your next appointment, please call your pharmacy.

## 2018-01-02 NOTE — Assessment & Plan Note (Signed)
GFR 57 

## 2018-01-02 NOTE — Assessment & Plan Note (Signed)
Cleared from cardiac standpoint for colonoscopy

## 2018-01-02 NOTE — Assessment & Plan Note (Signed)
On Livalo- followed by PCP

## 2018-01-02 NOTE — Addendum Note (Signed)
Addended by: Paulla Dolly on: 01/02/2018 08:38 AM   Modules accepted: Orders

## 2018-01-07 NOTE — Progress Notes (Signed)
Cleared by cardiology. Please see the note

## 2018-01-08 DIAGNOSIS — J069 Acute upper respiratory infection, unspecified: Secondary | ICD-10-CM | POA: Diagnosis not present

## 2018-01-08 DIAGNOSIS — I10 Essential (primary) hypertension: Secondary | ICD-10-CM | POA: Diagnosis not present

## 2018-01-08 DIAGNOSIS — K625 Hemorrhage of anus and rectum: Secondary | ICD-10-CM | POA: Diagnosis not present

## 2018-01-08 DIAGNOSIS — Z6828 Body mass index (BMI) 28.0-28.9, adult: Secondary | ICD-10-CM | POA: Diagnosis not present

## 2018-01-10 ENCOUNTER — Encounter: Payer: Self-pay | Admitting: Gastroenterology

## 2018-01-10 ENCOUNTER — Ambulatory Visit (INDEPENDENT_AMBULATORY_CARE_PROVIDER_SITE_OTHER): Payer: Medicare Other | Admitting: Gastroenterology

## 2018-01-10 VITALS — BP 112/78 | HR 75 | Ht 73.0 in | Wt 206.2 lb

## 2018-01-10 DIAGNOSIS — Z86010 Personal history of colonic polyps: Secondary | ICD-10-CM

## 2018-01-10 DIAGNOSIS — K625 Hemorrhage of anus and rectum: Secondary | ICD-10-CM | POA: Diagnosis not present

## 2018-01-10 NOTE — Patient Instructions (Addendum)
If you are age 79 or older, your body mass index should be between 23-30. Your Body mass index is 27.21 kg/m. If this is out of the aforementioned range listed, please consider follow up with your Primary Care Provider.  If you are age 25 or younger, your body mass index should be between 19-25. Your Body mass index is 27.21 kg/m. If this is out of the aformentioned range listed, please consider follow up with your Primary Care Provider.   You will be due for a recall colonoscopy in 04/2018. We will send you a reminder in the mail when it gets closer to that time.   Thank you,  Dr. Jackquline Denmark

## 2018-01-10 NOTE — Progress Notes (Signed)
IMPRESSION and PLAN:    #1. Rectal bleeding -resolved. (likely due to diverticular bleed, taken off plavix by cardiology). Neg colon 07/2012 for diverticular disease. #2. Diarrhea (better) #3.H/O polyps     Plan: - CBC results from Amy done yesterday. - Proceed with colonoscopy with miralax.  I have discussed the risks and benefits.  The risks including risk of perforation requiring laparotomy, bleeding after polypectomy requiring blood transfusions and risks of anesthesia/sedation were discussed.  Rare risks of missing colorectal neoplasms were also discussed.  Alternatives were given.  Patient is fully aware and agrees to proceed. All the questions were answered. Colonoscopy will be scheduled in upcoming days.  Patient is to report immediately if there is any significant weight loss or excessive bleeding until then. Consent forms were given for review. - Stop miralax. - Can continue probiotics. - Minimize antibiotics for sinusitis as much as possible.  Watch for recurrent C. difficile. - If still with problems, stop stool sofners.  HPI:    Chief Complaint:   Daniel Herrera is a 77 y.o. male  FU from admission to [facility] 2 weeks ago with lower GI bleeding thought to be diverticular bleed.  He was managed conservatively.  Hemoglobin remained stable. Did drop from 14 to 11.  Has been seen by cardiology for follow-up visit- Plavix has been switched to aspirin. No further bleeding. Taking Augmentin for acute sinusitis. The diarrhea is better after probiotics Denies having any GI complaints currently. No abdominal pain No fever or chills.  Previous records reviewed: -Colonoscopy 07/11/2012: Pancolonic diverticulosis predominantly in the left colon, small internal hemorrhoids.  Otherwise normal colonoscopy.  No radiation-induced changes in the rectum.  Has previous history of colonic polyps.   Past Medical History:  Diagnosis Date  . Angio-edema   . Anxiety   . Arthritis    . CAD (coronary artery disease)    a. 02/2015: DES to mid-LAD  . GERD (gastroesophageal reflux disease)   . Gout   . High cholesterol   . History of prolonged Q-T interval on ECG   . Hx of Clostridium difficile infection   . Hx of umbilical hernia repair   . Personal history of digestive disease    gastric ulcer  . Prostate cancer ([place])   . Prostate enlargement   . Skin cancer    basal cell carcinoma  . Thyroid disease   . Urticaria     Current Outpatient Medications  Medication Sig Dispense Refill  . aspirin EC 81 MG tablet Take 1 tablet (81 mg total) by mouth daily. 90 tablet 3  . azelastine (ASTELIN) 0.1 % nasal spray Place 1 spray into both nostrils 2 (two) times daily. 30 mL 5  . Cetirizine HCl (ZYRTEC ALLERGY) 10 MG CAPS Take 1 capsule (10 mg total) by mouth at bedtime and may repeat dose one time if needed. 30 capsule 0  . docusate sodium (COLACE) 100 MG capsule Take 1 capsule (100 mg total) by mouth daily. 30 capsule 0  . EPINEPHrine 0.3 mg/0.3 mL IJ SOAJ injection Inject 0.3 mg into the muscle as needed (for allergic reaction).   0  . fluticasone (FLONASE) 50 MCG/ACT nasal spray Place 1 spray into both nostrils daily. 16 g 4  . levothyroxine (SYNTHROID, LEVOTHROID) 75 MCG tablet Take 75 mcg by mouth daily before breakfast.    . metoprolol tartrate (LOPRESSOR) 25 MG tablet Take 1 tablet (25 mg total) by mouth 2 (two) times daily. 90 tablet 6  .  montelukast (SINGULAIR) 10 MG tablet Take 1 tablet (10 mg total) by mouth at bedtime. 30 tablet 5  . nitroGLYCERIN (NITROSTAT) 0.4 MG SL tablet DISSOLVE 1 TABLET UNDER TONGUE EVERY 5 MINUTES FOR 3 DOSES AS NEEDED CHEST PAIN. IF NO RELIEF CALL 911 75 tablet 2  . pantoprazole (PROTONIX) 40 MG tablet Take 1 tablet (40 mg total) by mouth 2 (two) times daily. 60 tablet 0  . Pitavastatin Calcium (LIVALO) 4 MG TABS Take 4 mg by mouth at bedtime.     Vladimir Faster Glycol-Propyl Glycol (SYSTANE OP) Place 1 drop into both eyes 2 (two) times  daily.    . polyethylene glycol (MIRALAX) packet Take 17 g by mouth daily. 28 each 0   No current facility-administered medications for this visit.     Past Surgical History:  Procedure Laterality Date  . APPENDECTOMY    . BACK SURGERY    . CARDIAC CATHETERIZATION N/A 02/21/2015   Procedure: Left Heart Cath;  Surgeon: Lorretta Harp, MD;  Location: [place] [facility];  Service: Cardiovascular;  Laterality: N/A;  . CATARACT EXTRACTION, BILATERAL    . NASAL SINUS SURGERY    . PROSTATE BIOPSY    . right shoulder rotator cuff repair      Family History  Problem Relation Age of Onset  . Cancer Mother   . Cancer Father        prostate  . Cancer Sister     Social History   Tobacco Use  . Smoking status: Never Smoker  . Smokeless tobacco: Never Used  Substance Use Topics  . Alcohol use: No  . Drug use: No    Allergies  Allergen Reactions  . Lisinopril Swelling    Facial and lip swelling. And throat swelling.   . Benadryl [Diphenhydramine Hcl] Swelling    Facial swelling from high dose (tolerates Advil PM as needed)  . Darvocet [Propoxyphene N-Acetaminophen] Other (See Comments)    hallucination  . Darvon [Propoxyphene Hcl] Other (See Comments)    Hallucination   . Tylenol [Acetaminophen] Swelling    Foot swelling     Review of Systems: All systems reviewed and negative except where noted in HPI.    Physical Exam:     BP 112/78   Pulse 75   Ht 6\' 1"  (1.854 m)   Wt 206 lb 4 oz (93.6 kg)   SpO2 97%   BMI 27.21 kg/m  @WEIGHTLAST3 @ GENERAL:  Alert, oriented, cooperative, not in acute distress. PSYCH: :Pleasant, normal mood and affect. HEENT:  conjunctiva pink, mucous membranes moist, neck supple without masses. No jaundice. CARDIAC:  S1 S2 normal. No murmers. PULM: Normal respiratory effort, lungs CTA bilaterally, no wheezing. ABDOMEN: Inspection: No visible peristalsis, no abnormal pulsations, skin normal.  Palpation/percussion: Soft, nontender,  nondistended, no rigidity, no abnormal dullness to percussion, no hepatosplenomegaly and no palpable abdominal masses.  Auscultation: Normal bowel sounds, no abdominal bruits. Rectal exam: Deferred SKIN:  turgor, no lesions seen. Musculoskeletal:  Normal muscle tone, normal strength. NEURO: Alert and oriented x 3, no focal neurologic deficits.   Data Reviewed: I have personally reviewed following labs and imaging studies CBC Latest Ref Rng & Units 12/23/2017 12/22/2017 12/21/2017  WBC 4.0 - 10.5 K/uL 3.2(L) 3.7(L) 5.8  Hemoglobin 13.0 - 17.0 g/dL 11.6(L) 11.6(L) 14.1  Hematocrit 39.0 - 52.0 % 34.2(L) 34.8(L) 43.6  Platelets 150 - 400 K/uL 154 152 197   CMP Latest Ref Rng & Units 12/22/2017 12/21/2017 09/12/2017  Glucose 70 - 99 mg/dL  114(H) 169(H) -  BUN 8 - 23 mg/dL 18 24(H) -  Creatinine 0.61 - 1.24 mg/dL 1.18 1.46(H) -  Sodium 135 - 145 mmol/L 141 143 -  Potassium 3.5 - 5.1 mmol/L 3.6 3.8 -  Chloride 98 - 111 mmol/L 112(H) 112(H) -  CO2 22 - 32 mmol/L 22 21(L) -  Calcium 8.9 - 10.3 mg/dL 8.2(L) 8.8(L) -  Total Protein 6.5 - 8.1 g/dL - 5.8(L) 6.4  Total Bilirubin 0.3 - 1.2 mg/dL - 0.7 -  Alkaline Phos 38 - 126 U/L - 87 -  AST 15 - 41 U/L - 29 -  ALT 0 - 44 U/L - 24 -       Radiology Studies: Dg Chest 2 View  Result Date: 12/21/2017 CLINICAL DATA:  Productive cough EXAM: CHEST - 2 VIEW COMPARISON:  08/02/2017, 07/28/2017 FINDINGS: Diffuse bronchitic changes. No focal consolidation or effusion. Stable cardiomediastinal silhouette. No pneumothorax. Biapical pleural and parenchymal scarring. Surgical changes in the right humeral head. IMPRESSION: 1. Findings suspicious for acute on chronic bronchitic changes. No focal pulmonary infiltrate is seen. Electronically Signed   By: Donavan Foil M.D.   On: 12/21/2017 18:21   Ct Abdomen Pelvis W Contrast  Result Date: 12/21/2017 CLINICAL DATA:  Pt st's he went to use the bathroom and had bright red bleeding coming from rectum. Pt is currently  on blood thinners. Pt denies any abd pain or discomfort EXAM: CT ABDOMEN AND PELVIS WITH CONTRAST TECHNIQUE: Multidetector CT imaging of the abdomen and pelvis was performed using the standard protocol following bolus administration of intravenous contrast. CONTRAST:  75 mL OMNIPAQUE IOHEXOL 300 MG/ML  SOLN COMPARISON:  05/23/2015 FINDINGS: Lower chest: No acute abnormality. Hepatobiliary: No focal liver abnormality is seen. No gallstones, gallbladder wall thickening, or biliary dilatation. Pancreas: Unremarkable. No pancreatic ductal dilatation or surrounding inflammatory changes. Spleen: Normal in size without focal abnormality. Adrenals/Urinary Tract: No adrenal masses. Bilateral renal cortical thinning. Bilateral renal sinus cysts. No stones. No hydronephrosis. Symmetric renal enhancement and excretion. Normal ureters. 8 mm dependent stone in the bladder. Left posterior bladder diverticulum. Stone is larger than it was on the prior CT. Diverticulum is stable. Stomach/Bowel: Small hiatal hernia. Stomach otherwise unremarkable. Normal small bowel. There are numerous colonic diverticula predominantly along the sigmoid and descending colon. No evidence of diverticulitis. No colonic wall thickening or other inflammatory process. Vascular/Lymphatic: Aortic atherosclerosis. No enlarged abdominal or pelvic lymph nodes. Reproductive: Prostate normal in size. Other: No abdominal wall hernia or abnormality. No abdominopelvic ascites. Musculoskeletal: No fracture or acute finding. No osteoblastic or osteolytic lesions. IMPRESSION: 1. No acute findings within the abdomen or pelvis. 2. Multiple colonic diverticula most prominent along the sigmoid and lower descending colon. No evidence of diverticulitis 3. Bilateral renal sinus cysts. 4. 8 mm dependent bladder stone. Small stable left posterior bladder diverticulum. 5. Aortic atherosclerosis. Electronically Signed   By: Lajean Manes M.D.   On: 12/21/2017 18:30   CT  reviewed independently-diverticular disease    Hancel Ion,MD 01/10/2018, 3:14 PM   CC [name], [name], NP

## 2018-01-17 ENCOUNTER — Telehealth: Payer: Self-pay | Admitting: Allergy and Immunology

## 2018-01-17 NOTE — Telephone Encounter (Signed)
Pt called and wanted to talk with dr Neldon Mc about the Daniel Herrera injection bacause of the swelling. [phone].

## 2018-01-17 NOTE — Telephone Encounter (Signed)
Called and spoke with Daniel Herrera, he stated that yesterday he was having swelling in his left leg and some swelling in his face. He is continuing to use the [place] every 14 days. He did want to call and advise that he gave himself his first injection of Firazyr yesterday and wanted to let Dr. Neldon Mc know that he had started his first injection. He did state today that his swelling in his leg and face today were much better and he has tolerated the first injection of Firazyr well. He does think that his swelling was more attributed from Antibiotics and Mucinex DM this time rather than his Hereditary Angioedema.

## 2018-01-17 NOTE — Telephone Encounter (Signed)
Spoke to patient and advised him to make sure he keeps doses on hand. He currently has 2-3 doses on hand.  Also advised if he had increase in swelling reactions to let us know also.

## 2018-01-17 NOTE — Telephone Encounter (Signed)
OK, Please inform patient that he should always have a firzyr injectable available in case he ever does develop significant swelling.

## 2018-01-23 DIAGNOSIS — R0982 Postnasal drip: Secondary | ICD-10-CM | POA: Diagnosis not present

## 2018-01-23 DIAGNOSIS — R490 Dysphonia: Secondary | ICD-10-CM | POA: Diagnosis not present

## 2018-01-23 DIAGNOSIS — J381 Polyp of vocal cord and larynx: Secondary | ICD-10-CM | POA: Diagnosis not present

## 2018-01-23 DIAGNOSIS — J310 Chronic rhinitis: Secondary | ICD-10-CM | POA: Diagnosis not present

## 2018-01-30 NOTE — Telephone Encounter (Signed)
Visit was scheduled for 01/23/2018 with Dr Benjamine Mola

## 2018-02-09 ENCOUNTER — Emergency Department (HOSPITAL_COMMUNITY)
Admission: EM | Admit: 2018-02-09 | Discharge: 2018-02-09 | Disposition: A | Discharge status: Home / Self Care | Payer: Medicare Other | Attending: Emergency Medicine | Admitting: Emergency Medicine

## 2018-02-09 ENCOUNTER — Encounter (HOSPITAL_COMMUNITY): Payer: Self-pay | Admitting: *Deleted

## 2018-02-09 ENCOUNTER — Other Ambulatory Visit: Payer: Self-pay

## 2018-02-09 ENCOUNTER — Emergency Department (HOSPITAL_COMMUNITY): Payer: Medicare Other

## 2018-02-09 DIAGNOSIS — W01198A Fall on same level from slipping, tripping and stumbling with subsequent striking against other object, initial encounter: Secondary | ICD-10-CM | POA: Insufficient documentation

## 2018-02-09 DIAGNOSIS — Z8546 Personal history of malignant neoplasm of prostate: Secondary | ICD-10-CM | POA: Diagnosis not present

## 2018-02-09 DIAGNOSIS — N183 Chronic kidney disease, stage 3 (moderate): Secondary | ICD-10-CM | POA: Diagnosis not present

## 2018-02-09 DIAGNOSIS — S42032A Displaced fracture of lateral end of left clavicle, initial encounter for closed fracture: Secondary | ICD-10-CM | POA: Diagnosis not present

## 2018-02-09 DIAGNOSIS — I252 Old myocardial infarction: Secondary | ICD-10-CM | POA: Insufficient documentation

## 2018-02-09 DIAGNOSIS — F419 Anxiety disorder, unspecified: Secondary | ICD-10-CM | POA: Diagnosis not present

## 2018-02-09 DIAGNOSIS — R9431 Abnormal electrocardiogram [ECG] [EKG]: Secondary | ICD-10-CM | POA: Diagnosis not present

## 2018-02-09 DIAGNOSIS — Z7982 Long term (current) use of aspirin: Secondary | ICD-10-CM | POA: Diagnosis not present

## 2018-02-09 DIAGNOSIS — W19XXXA Unspecified fall, initial encounter: Secondary | ICD-10-CM

## 2018-02-09 DIAGNOSIS — Z79899 Other long term (current) drug therapy: Secondary | ICD-10-CM | POA: Diagnosis not present

## 2018-02-09 DIAGNOSIS — E1122 Type 2 diabetes mellitus with diabetic chronic kidney disease: Secondary | ICD-10-CM | POA: Insufficient documentation

## 2018-02-09 DIAGNOSIS — Y998 Other external cause status: Secondary | ICD-10-CM | POA: Insufficient documentation

## 2018-02-09 DIAGNOSIS — S0990XA Unspecified injury of head, initial encounter: Secondary | ICD-10-CM | POA: Diagnosis not present

## 2018-02-09 DIAGNOSIS — Z85828 Personal history of other malignant neoplasm of skin: Secondary | ICD-10-CM | POA: Diagnosis not present

## 2018-02-09 DIAGNOSIS — Z23 Encounter for immunization: Secondary | ICD-10-CM | POA: Diagnosis not present

## 2018-02-09 DIAGNOSIS — Y929 Unspecified place or not applicable: Secondary | ICD-10-CM | POA: Insufficient documentation

## 2018-02-09 DIAGNOSIS — Y9389 Activity, other specified: Secondary | ICD-10-CM | POA: Diagnosis not present

## 2018-02-09 DIAGNOSIS — I129 Hypertensive chronic kidney disease with stage 1 through stage 4 chronic kidney disease, or unspecified chronic kidney disease: Secondary | ICD-10-CM | POA: Insufficient documentation

## 2018-02-09 DIAGNOSIS — S0993XA Unspecified injury of face, initial encounter: Secondary | ICD-10-CM | POA: Diagnosis not present

## 2018-02-09 DIAGNOSIS — S199XXA Unspecified injury of neck, initial encounter: Secondary | ICD-10-CM | POA: Diagnosis not present

## 2018-02-09 DIAGNOSIS — S0003XA Contusion of scalp, initial encounter: Secondary | ICD-10-CM | POA: Insufficient documentation

## 2018-02-09 DIAGNOSIS — I2510 Atherosclerotic heart disease of native coronary artery without angina pectoris: Secondary | ICD-10-CM | POA: Insufficient documentation

## 2018-02-09 DIAGNOSIS — S4992XA Unspecified injury of left shoulder and upper arm, initial encounter: Secondary | ICD-10-CM | POA: Diagnosis present

## 2018-02-09 LAB — CBC WITH DIFFERENTIAL/PLATELET
Abs Immature Granulocytes: 0.02 10*3/uL (ref 0.00–0.07)
Basophils Absolute: 0 10*3/uL (ref 0.0–0.1)
Basophils Relative: 1 %
Eosinophils Absolute: 0.1 10*3/uL (ref 0.0–0.5)
Eosinophils Relative: 3 %
HCT: 39.8 % (ref 39.0–52.0)
Hemoglobin: 13 g/dL (ref 13.0–17.0)
Immature Granulocytes: 1 %
Lymphocytes Relative: 12 %
Lymphs Abs: 0.5 10*3/uL — ABNORMAL LOW (ref 0.7–4.0)
MCH: 30.9 pg (ref 26.0–34.0)
MCHC: 32.7 g/dL (ref 30.0–36.0)
MCV: 94.5 fL (ref 80.0–100.0)
Monocytes Absolute: 0.4 10*3/uL (ref 0.1–1.0)
Monocytes Relative: 9 %
Neutro Abs: 3.1 10*3/uL (ref 1.7–7.7)
Neutrophils Relative %: 74 %
Platelets: 150 10*3/uL (ref 150–400)
RBC: 4.21 MIL/uL — ABNORMAL LOW (ref 4.22–5.81)
RDW: 13 % (ref 11.5–15.5)
WBC: 4.2 10*3/uL (ref 4.0–10.5)
nRBC: 0 % (ref 0.0–0.2)

## 2018-02-09 LAB — BASIC METABOLIC PANEL
Anion gap: 8 (ref 5–15)
BUN: 21 mg/dL (ref 8–23)
CO2: 20 mmol/L — ABNORMAL LOW (ref 22–32)
Calcium: 8.7 mg/dL — ABNORMAL LOW (ref 8.9–10.3)
Chloride: 111 mmol/L (ref 98–111)
Creatinine, Ser: 1.38 mg/dL — ABNORMAL HIGH (ref 0.61–1.24)
GFR calc Af Amer: 55 mL/min — ABNORMAL LOW (ref >60)
GFR calc non Af Amer: 47 mL/min — ABNORMAL LOW (ref >60)
Glucose, Bld: 173 mg/dL — ABNORMAL HIGH (ref 70–99)
Potassium: 3.3 mmol/L — ABNORMAL LOW (ref 3.5–5.1)
Sodium: 139 mmol/L (ref 135–145)

## 2018-02-09 MED ORDER — TETANUS-DIPHTH-ACELL PERTUSSIS 5-2.5-18.5 LF-MCG/0.5 IM SUSP
0.5000 mL | Freq: Once | INTRAMUSCULAR | Status: AC
  Administered 2018-02-09: 0.5 mL via INTRAMUSCULAR
  Filled 2018-02-09: qty 0.5

## 2018-02-09 MED ORDER — NAPROXEN 375 MG PO TABS: 375.0000 mg | ORAL_TABLET | Freq: Two times a day (BID) | ORAL | 0 refills | Status: DC

## 2018-02-09 MED ORDER — ACETAMINOPHEN 500 MG PO TABS: 500.0000 mg | ORAL_TABLET | Freq: Four times a day (QID) | ORAL | 0 refills | Status: DC | PRN

## 2018-02-09 MED ORDER — FENTANYL CITRATE (PF) 100 MCG/2ML IJ SOLN
50.0000 ug | Freq: Once | INTRAMUSCULAR | Status: AC
  Administered 2018-02-09: 50 ug via INTRAVENOUS
  Filled 2018-02-09: qty 2

## 2018-02-09 NOTE — ED Notes (Signed)
Pt verbalized understanding of discharge paperwork, prescriptions and follow-up care 

## 2018-02-09 NOTE — Discharge Instructions (Addendum)
You may want to consult with your doctor for which pain medications will be best for your condition.  Please follow-up with your orthopedic doctor, Dr. Ninfa Linden, or with orthopedic doctor for further evaluation of your clavicle fracture.  Wear your sling at all times except when bathing.  Please return the emergency department if you develop any new or worsening symptoms.

## 2018-02-09 NOTE — ED Triage Notes (Signed)
Pt reports he fell coming out of the utility building this Am. Pt reports he hit the Lt side of head and Lt shoulder. Pt reports no LOC with fall.  Pt also has  Hereditary angioedema and needs Firazyr  Injections for swelling.

## 2018-02-09 NOTE — ED Notes (Signed)
Patient transported to X-ray 

## 2018-02-09 NOTE — ED Provider Notes (Signed)
[facility] EMERGENCY DEPARTMENT Provider Note   CSN: [id number] Arrival date & time: 02/09/18  1201     History   Chief Complaint Chief Complaint  Patient presents with  . Fall    HPI Daniel Herrera is a 77 y.o. male with history of CAD, GERD, gout, hereditary angioedema who presents following fall.  Patient reports he believes he tripped and fell while trying to get out of the car and hit his left side of the car and fell to the ground.  He did not lose consciousness.  He has pain to his head and left shoulder.  He also skinned his left knee.  He denies feeling dizzy or lightheaded prior to falling.  He reports he has cataracts and wears glasses that make him have difficulty seeing where he is walking sometimes.  He has most of the pain in his left shoulder.  No medication given prior to arrival.  Tetanus is not up-to-date.  He denies any chest pain, shortness of breath, abdominal pain, nausea, vomiting, neck, back pain.  HPI  Past Medical History:  Diagnosis Date  . Angio-edema   . Anxiety   . Arthritis   . CAD (coronary artery disease)    a. 02/2015: DES to mid-LAD  . GERD (gastroesophageal reflux disease)   . Gout   . High cholesterol   . History of prolonged Q-T interval on ECG   . Hx of Clostridium difficile infection   . Hx of umbilical hernia repair   . Personal history of digestive disease    gastric ulcer  . Prostate cancer ([place])   . Prostate enlargement   . Skin cancer    basal cell carcinoma  . Thyroid disease   . Urticaria     Patient Active Problem List   Diagnosis Date Noted  . Pre-operative clearance 01/02/2018  . Hematochezia 12/21/2017  . CKD (chronic kidney disease) stage 3, GFR 30-59 ml/min (HCC) 12/21/2017  . GI bleed 12/21/2017  . Acute respiratory insufficiency   . Angioedema 07/28/2017  . Enterotoxigenic Escherichia coli infection 05/24/2015  . STEC (Shiga toxin-producing Escherichia coli) infection 05/24/2015  .  Colitis 05/23/2015  . History of prostate cancer   . Chronic diarrhea of unknown origin   . CAD S/P percutaneous coronary angioplasty   . Type 2 diabetes mellitus with unspecified complications ([place])   . Chest pain 04/08/2015  . Dyspnea 04/08/2015  . Palpitations 03/15/2015  . History of non-ST elevation myocardial infarction (NSTEMI) 02/22/2015  . Abnormal EKG 02/21/2015  . Essential hypertension 02/21/2015  . Dyslipidemia, goal LDL below 70 02/21/2015  . Malignant neoplasm of prostate ([place]) 08/07/2014  . Acute interstitial pneumonitis ([place]) 12/17/2013    Past Surgical History:  Procedure Laterality Date  . APPENDECTOMY    . BACK SURGERY    . CARDIAC CATHETERIZATION N/A 02/21/2015   Procedure: Left Heart Cath;  Surgeon: Lorretta Harp, MD;  Location: [facility];  Service: Cardiovascular;  Laterality: N/A;  . CATARACT EXTRACTION, BILATERAL    . NASAL SINUS SURGERY    . PROSTATE BIOPSY    . right shoulder rotator cuff repair          Home Medications    Prior to Admission medications   Medication Sig Start Date End Date Taking? Authorizing Provider  acetaminophen (TYLENOL) 500 MG tablet Take 1 tablet (500 mg total) by mouth every 6 (six) hours as needed. 02/09/18   Frederica Kuster, PA-C  aspirin EC 81  MG tablet Take 1 tablet (81 mg total) by mouth daily. 01/02/18   Erlene Quan, PA-C  azelastine (ASTELIN) 0.1 % nasal spray Place 1 spray into both nostrils 2 (two) times daily. 12/12/17   Kozlow, Donnamarie Poag, MD  Cetirizine HCl (ZYRTEC ALLERGY) 10 MG CAPS Take 1 capsule (10 mg total) by mouth at bedtime and may repeat dose one time if needed. 12/23/17   Thurnell Lose, MD  EPINEPHrine 0.3 mg/0.3 mL IJ SOAJ injection Inject 0.3 mg into the muscle as needed (for allergic reaction).  08/21/17   [provider]  fluticasone (FLONASE) 50 MCG/ACT nasal spray Place 1 spray into both nostrils daily. 12/12/17   Kozlow, Donnamarie Poag, MD  levothyroxine (SYNTHROID, LEVOTHROID) 75  MCG tablet Take 75 mcg by mouth daily before breakfast.    [provider]  metoprolol tartrate (LOPRESSOR) 25 MG tablet Take 1 tablet (25 mg total) by mouth 2 (two) times daily. 05/25/15   Janece Canterbury, MD  montelukast (SINGULAIR) 10 MG tablet Take 1 tablet (10 mg total) by mouth at bedtime. 09/12/17   Kozlow, Donnamarie Poag, MD  naproxen (NAPROSYN) 375 MG tablet Take 1 tablet (375 mg total) by mouth 2 (two) times daily. 02/09/18   Velvet Moomaw, Bea Graff, PA-C  nitroGLYCERIN (NITROSTAT) 0.4 MG SL tablet DISSOLVE 1 TABLET UNDER TONGUE EVERY 5 MINUTES FOR 3 DOSES AS NEEDED CHEST PAIN. IF NO RELIEF CALL 911 03/03/15   Lyda Jester M, PA-C  pantoprazole (PROTONIX) 40 MG tablet Take 1 tablet (40 mg total) by mouth 2 (two) times daily. 12/23/17   Thurnell Lose, MD  Pitavastatin Calcium (LIVALO) 4 MG TABS Take 4 mg by mouth at bedtime.     [provider]  Polyethyl Glycol-Propyl Glycol (SYSTANE OP) Place 1 drop into both eyes 2 (two) times daily.    [provider]  polyethylene glycol (MIRALAX) packet Take 17 g by mouth daily. 12/23/17   Thurnell Lose, MD    Family History Family History  Problem Relation Age of Onset  . Cancer Mother   . Cancer Father        prostate  . Cancer Sister     Social History Social History   Tobacco Use  . Smoking status: Never Smoker  . Smokeless tobacco: Never Used  Substance Use Topics  . Alcohol use: No  . Drug use: No     Allergies   Lisinopril; Benadryl [diphenhydramine hcl]; Darvocet [propoxyphene n-acetaminophen]; Darvon [propoxyphene hcl]; and Tylenol [acetaminophen]   Review of Systems Review of Systems  Constitutional: Negative for chills and fever.  HENT: Positive for facial swelling. Negative for sore throat.   Respiratory: Negative for shortness of breath.   Cardiovascular: Negative for chest pain.  Gastrointestinal: Negative for abdominal pain, nausea and vomiting.  Genitourinary: Negative for dysuria.    Musculoskeletal: Positive for arthralgias and joint swelling. Negative for back pain.  Skin: Negative for rash and wound.  Neurological: Positive for headaches. Negative for syncope.  Psychiatric/Behavioral: The patient is not nervous/anxious.      Physical Exam Updated Vital Signs BP (!) 171/87   Pulse 61   Temp (!) 97.4 F (36.3 C) (Oral)   Resp 19   Ht 6\' 1"  (1.854 m)   Wt 97.1 kg   SpO2 100%   BMI 28.24 kg/m   Physical Exam  Constitutional: He appears well-developed and well-nourished. No distress.  HENT:  Head: Normocephalic and atraumatic.  Mouth/Throat: Oropharynx is clear and moist. No oropharyngeal exudate.  Large hematoma to the occipital through the crown head with tenderness; no break in skin Swelling to the left preauricular area with some underlying tenderness  Eyes: Pupils are equal, round, and reactive to light. Conjunctivae and EOM are normal. Right eye exhibits no discharge. Left eye exhibits no discharge. No scleral icterus.  Neck: Normal range of motion. Neck supple. No thyromegaly present.  Cardiovascular: Normal rate, regular rhythm, normal heart sounds and intact distal pulses. Exam reveals no gallop and no friction rub.  No murmur heard. Pulmonary/Chest: Effort normal and breath sounds normal. No stridor. No respiratory distress. He has no wheezes. He has no rales.  Abdominal: Soft. Bowel sounds are normal. He exhibits no distension. There is no tenderness. There is no rebound and no guarding.  Musculoskeletal: He exhibits no edema.  Swelling and tenderness over the left shoulder No midline cervical, thoracic, or lumbar tenderness  Lymphadenopathy:    He has no cervical adenopathy.  Neurological: He is alert. Coordination normal.  CN 3-12 intact; normal sensation throughout; 5/5 strength in all 4 extremities; equal bilateral grip strength  Skin: Skin is warm and dry. No rash noted. He is not diaphoretic. No pallor.  Very superficial, small abrasion  to the left knee without underlying bony tenderness  Psychiatric: He has a normal mood and affect.  Nursing note and vitals reviewed.    ED Treatments / Results  Labs (all labs ordered are listed, but only abnormal results are displayed) Labs Reviewed  BASIC METABOLIC PANEL - Abnormal; Notable for the following components:      Result Value   Potassium 3.3 (*)    CO2 20 (*)    Glucose, Bld 173 (*)    Creatinine, Ser 1.38 (*)    Calcium 8.7 (*)    GFR calc non Af Amer 47 (*)    GFR calc Af Amer 55 (*)    All other components within normal limits  CBC WITH DIFFERENTIAL/PLATELET - Abnormal; Notable for the following components:   RBC 4.21 (*)    Lymphs Abs 0.5 (*)    All other components within normal limits    EKG EKG Interpretation  Date/Time:  Saturday February 09 2018 12:14:19 EST Ventricular Rate:  60 PR Interval:    QRS Duration: 108 QT Interval:  425 QTC Calculation: 425 R Axis:   -36 Text Interpretation:  Sinus rhythm Borderline prolonged PR interval Left axis deviation Abnormal R-wave progression, early transition Baseline wander in lead(s) II III aVF similar to prior 4/19 Confirmed by Aletta Edouard [phone]) on 02/09/2018 12:18:50 PM   Radiology Dg Clavicle Left  Result Date: 02/09/2018 CLINICAL DATA:  Acute left shoulder pain after fall. EXAM: LEFT CLAVICLE - 2+ VIEWS COMPARISON:  None. FINDINGS: Moderately displaced and comminuted distal left clavicular fracture is noted. IMPRESSION: Moderately displaced and comminuted distal left clavicular fracture. Electronically Signed   By: Marijo Conception, M.D.   On: 02/09/2018 14:29   Ct Head Wo Contrast  Result Date: 02/09/2018 CLINICAL DATA:  Head injury after fall.  No loss of consciousness. EXAM: CT HEAD WITHOUT CONTRAST CT MAXILLOFACIAL WITHOUT CONTRAST CT CERVICAL SPINE WITHOUT CONTRAST TECHNIQUE: Multidetector CT imaging of the head, cervical spine, and maxillofacial structures were performed using the standard  protocol without intravenous contrast. Multiplanar CT image reconstructions of the cervical spine and maxillofacial structures were also generated. COMPARISON:  None. FINDINGS: CT HEAD FINDINGS Brain: Mild diffuse cortical atrophy is noted. Mild chronic ischemic white matter disease is noted. No mass effect or midline  shift is noted. Ventricular size is within normal limits. There is no evidence of mass lesion, hemorrhage or acute infarction. Vascular: No hyperdense vessel or unexpected calcification. Skull: Normal. Negative for fracture or focal lesion. Other: Large left parietal scalp hematoma is noted. CT MAXILLOFACIAL FINDINGS Osseous: No fracture or mandibular dislocation. No destructive process. Orbits: Negative. No traumatic or inflammatory finding. Sinuses: Bilateral maxillary sinusitis is noted. Soft tissues: Negative. CT CERVICAL SPINE FINDINGS Alignment: Normal. Skull base and vertebrae: No acute fracture. No primary bone lesion or focal pathologic process. Soft tissues and spinal canal: No prevertebral fluid or swelling. No visible canal hematoma. Disc levels: Severe degenerative disc disease is noted at C4-5, C5-6 and C6-7 with anterior posterior osteophyte formation. Upper chest: Negative. Other: None. IMPRESSION: Mild diffuse cortical atrophy. Mild chronic ischemic white matter disease. Large left parietal scalp hematoma. No acute intracranial abnormality seen. Bilateral maxillary sinusitis is noted. No traumatic injury seen in maxillofacial region. Severe multilevel degenerative disc disease. No acute abnormality seen in the cervical spine. Electronically Signed   By: Marijo Conception, M.D.   On: 02/09/2018 14:47   Ct Cervical Spine Wo Contrast  Result Date: 02/09/2018 CLINICAL DATA:  Head injury after fall.  No loss of consciousness. EXAM: CT HEAD WITHOUT CONTRAST CT MAXILLOFACIAL WITHOUT CONTRAST CT CERVICAL SPINE WITHOUT CONTRAST TECHNIQUE: Multidetector CT imaging of the head, cervical spine,  and maxillofacial structures were performed using the standard protocol without intravenous contrast. Multiplanar CT image reconstructions of the cervical spine and maxillofacial structures were also generated. COMPARISON:  None. FINDINGS: CT HEAD FINDINGS Brain: Mild diffuse cortical atrophy is noted. Mild chronic ischemic white matter disease is noted. No mass effect or midline shift is noted. Ventricular size is within normal limits. There is no evidence of mass lesion, hemorrhage or acute infarction. Vascular: No hyperdense vessel or unexpected calcification. Skull: Normal. Negative for fracture or focal lesion. Other: Large left parietal scalp hematoma is noted. CT MAXILLOFACIAL FINDINGS Osseous: No fracture or mandibular dislocation. No destructive process. Orbits: Negative. No traumatic or inflammatory finding. Sinuses: Bilateral maxillary sinusitis is noted. Soft tissues: Negative. CT CERVICAL SPINE FINDINGS Alignment: Normal. Skull base and vertebrae: No acute fracture. No primary bone lesion or focal pathologic process. Soft tissues and spinal canal: No prevertebral fluid or swelling. No visible canal hematoma. Disc levels: Severe degenerative disc disease is noted at C4-5, C5-6 and C6-7 with anterior posterior osteophyte formation. Upper chest: Negative. Other: None. IMPRESSION: Mild diffuse cortical atrophy. Mild chronic ischemic white matter disease. Large left parietal scalp hematoma. No acute intracranial abnormality seen. Bilateral maxillary sinusitis is noted. No traumatic injury seen in maxillofacial region. Severe multilevel degenerative disc disease. No acute abnormality seen in the cervical spine. Electronically Signed   By: Marijo Conception, M.D.   On: 02/09/2018 14:47   Dg Shoulder Left  Result Date: 02/09/2018 CLINICAL DATA:  Left shoulder pain after fall. EXAM: LEFT SHOULDER - 2+ VIEW COMPARISON:  None. FINDINGS: Moderately displaced and comminuted distal left clavicular fracture is  noted. Visualized ribs are unremarkable. Glenohumeral joint appears normal. Visualized humerus appears normal. IMPRESSION: Moderately displaced and comminuted distal left clavicular fracture. Electronically Signed   By: Marijo Conception, M.D.   On: 02/09/2018 14:29   Ct Maxillofacial Wo Contrast  Result Date: 02/09/2018 CLINICAL DATA:  Head injury after fall.  No loss of consciousness. EXAM: CT HEAD WITHOUT CONTRAST CT MAXILLOFACIAL WITHOUT CONTRAST CT CERVICAL SPINE WITHOUT CONTRAST TECHNIQUE: Multidetector CT imaging of the head, cervical spine, and  maxillofacial structures were performed using the standard protocol without intravenous contrast. Multiplanar CT image reconstructions of the cervical spine and maxillofacial structures were also generated. COMPARISON:  None. FINDINGS: CT HEAD FINDINGS Brain: Mild diffuse cortical atrophy is noted. Mild chronic ischemic white matter disease is noted. No mass effect or midline shift is noted. Ventricular size is within normal limits. There is no evidence of mass lesion, hemorrhage or acute infarction. Vascular: No hyperdense vessel or unexpected calcification. Skull: Normal. Negative for fracture or focal lesion. Other: Large left parietal scalp hematoma is noted. CT MAXILLOFACIAL FINDINGS Osseous: No fracture or mandibular dislocation. No destructive process. Orbits: Negative. No traumatic or inflammatory finding. Sinuses: Bilateral maxillary sinusitis is noted. Soft tissues: Negative. CT CERVICAL SPINE FINDINGS Alignment: Normal. Skull base and vertebrae: No acute fracture. No primary bone lesion or focal pathologic process. Soft tissues and spinal canal: No prevertebral fluid or swelling. No visible canal hematoma. Disc levels: Severe degenerative disc disease is noted at C4-5, C5-6 and C6-7 with anterior posterior osteophyte formation. Upper chest: Negative. Other: None. IMPRESSION: Mild diffuse cortical atrophy. Mild chronic ischemic white matter disease. Large  left parietal scalp hematoma. No acute intracranial abnormality seen. Bilateral maxillary sinusitis is noted. No traumatic injury seen in maxillofacial region. Severe multilevel degenerative disc disease. No acute abnormality seen in the cervical spine. Electronically Signed   By: Marijo Conception, M.D.   On: 02/09/2018 14:47    Procedures Procedures (including critical care time)  Medications Ordered in ED Medications  fentaNYL (SUBLIMAZE) injection 50 mcg (50 mcg Intravenous Given 02/09/18 1308)  Tdap (BOOSTRIX) injection 0.5 mL (0.5 mLs Intramuscular Given 02/09/18 1308)     Initial Impression / Assessment and Plan / ED Course  I have reviewed the triage vital signs and the nursing notes.  Pertinent labs & imaging results that were available during my care of the patient were reviewed by me and considered in my medical decision making (see chart for details).  Clinical Course as of Feb 10 1712  Sat Feb 09, 4030  [id number] 77 year old male had a mechanical fall getting out of his car which he struck his head and face and left shoulder.  Is complaining of a moderate amount of left shoulder pain and he feels is probably broken.  He has a history of some angioedema and is got some swelling on his face.  He is needed ICU level of care and intubation in the past will need to be watched for this.   [MB]    Clinical Course User Index [MB] Hayden Rasmussen, MD    Patient presenting after fall with left shoulder pain.  Patient found to have left clavicular fracture.  He is neurovascularly intact.  I discussed patient case with orthopedic doctor on-call, Dr. Ninfa Linden, who advised sling and outpatient follow-up.  Labs are stable for the patient.  CT head, C-spine, maxillofacial shows large, left parietal hematoma, but otherwise no acute findings.  Patient has difficulty tolerating pain medication due to his angioedema, however he believes he tolerates Aleve and Tylenol.  Also suggested ice.  Patient to  follow-up with outpatient orthopedics.  Arm sling provided.  Return precautions discussed.  Patient understands and agrees with plan.  Patient vitals stable throughout ED course and discharged in satisfactory condition.  Patient also evaluated by my attending, Dr. Melina Copa, who guided the patient's management and agrees with plan.  Final Clinical Impressions(s) / ED Diagnoses   Final diagnoses:  Closed displaced fracture of acromial end of left  clavicle, initial encounter    ED Discharge Orders         Ordered    naproxen (NAPROSYN) 375 MG tablet  2 times daily     02/09/18 1536    acetaminophen (TYLENOL) 500 MG tablet  Every 6 hours PRN     02/09/18 1536           Kellee Sittner, Bea Graff, PA-C 02/09/18 1714    Hayden Rasmussen, MD 02/10/18 [phone]

## 2018-02-11 ENCOUNTER — Telehealth (INDEPENDENT_AMBULATORY_CARE_PROVIDER_SITE_OTHER): Payer: Self-pay | Admitting: Radiology

## 2018-02-11 NOTE — Telephone Encounter (Signed)
Patient aware of the below message  

## 2018-02-11 NOTE — Telephone Encounter (Signed)
We certainly need to see him before making a final decision about whether or not surgery is appropriate.  A lot will depend on his clinical exam in terms of if there is any issues with the skin or soft tissue around the clavicle and what type of pain he is having.  A lot of clavicles can heal completely without any surgery at all and usually in his age group we would try to avoid surgery at all costs unless there is an issue.  We will see what he looks like tomorrow.

## 2018-02-11 NOTE — Telephone Encounter (Signed)
No surgery correct?

## 2018-02-11 NOTE — Telephone Encounter (Signed)
Patient was seen at [facility] ED over the weekend for clavicle fracture. Appointment made for Tuesday morning at [place] to follow up with Dr. Ninfa Linden. Patient would like to know, if possible, if Dr. Ninfa Linden could look at his x-rays and advise whether he is going to have to have surgery?  CB for patient [phone].

## 2018-02-12 ENCOUNTER — Ambulatory Visit (INDEPENDENT_AMBULATORY_CARE_PROVIDER_SITE_OTHER): Payer: Medicare Other | Admitting: Orthopaedic Surgery

## 2018-02-12 ENCOUNTER — Encounter (INDEPENDENT_AMBULATORY_CARE_PROVIDER_SITE_OTHER): Payer: Self-pay | Admitting: Orthopaedic Surgery

## 2018-02-12 DIAGNOSIS — I2510 Atherosclerotic heart disease of native coronary artery without angina pectoris: Secondary | ICD-10-CM | POA: Diagnosis not present

## 2018-02-12 DIAGNOSIS — S42022A Displaced fracture of shaft of left clavicle, initial encounter for closed fracture: Secondary | ICD-10-CM | POA: Diagnosis not present

## 2018-02-12 DIAGNOSIS — Z9861 Coronary angioplasty status: Secondary | ICD-10-CM | POA: Diagnosis not present

## 2018-02-12 NOTE — Progress Notes (Signed)
Office Visit Note   Patient: Daniel Herrera           Date of Birth: May 14, 1940           MRN: [id number] Visit Date: 02/12/2018              Requested by: [name], NP [place], [place] [id number] PCP: [name], NP   Assessment & Plan: Visit Diagnoses:  1. Closed displaced fracture of shaft of left clavicle, initial encounter     Plan: This is a fracture that can be treated conservatively.  He agrees with this as well.  He understands he is going to hurting for a while and he should use his body as a guide in terms of increasing activities.  He will wear the sling and come in and out of it as comfort allows.  He said he is fine from a pain medication standpoint.  All question concerns were answered and addressed.  I will see him back in 3 weeks with a repeat 2 views of his left clavicle.  Follow-Up Instructions: Return in about 3 weeks (around 03/05/2018).   Orders:  No orders of the defined types were placed in this encounter.  No orders of the defined types were placed in this encounter.     Procedures: No procedures performed   Clinical Data: No additional findings.   Subjective: Chief Complaint  Patient presents with  . 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  He sustained a  mechanical fall when he tripped getting out of a car on 02/09/2018.  He  was found to have a lateral clavicle fracture with no significant soft tissue compromise.  Is been very painful to him and he is in the sling this is doing well overall.  He is been putting some type of length cement on his shoulder at night and it helps him sleep.  He denies any neck pain and denies any numbness and tingling in his hand.  He denies any other injuries from the fall.  He only reports left shoulder pain.  HPI  Review of Systems He currently denies any headache, chest pain, shortness of breath, fever, chills, nausea, vomiting.  Objective: Vital Signs: There were no vitals taken for this visit.  Physical Exam Is alert and oriented x3 and in no acute distress Ortho Exam Examination of his left shoulder does show bruising around the shoulder itself.  There is no soft tissue compromise from the clavicle fracture.  There is pain over the fracture site itself.  His neck exam is normal and from the elbow down to his left hand his exam is normal. Specialty Comments:  No specialty comments available.  Imaging: No results found. X-rays independently reviewed on the canopy system of the left shoulder and clavicle show a comminuted clavicle fracture but this lateral.  The AC joint is congruent.  PMFS History: Patient Active Problem List   Diagnosis Date Noted  . Pre-operative clearance 01/02/2018  . Hematochezia 12/21/2017  . CKD (chronic kidney disease) stage 3, GFR 30-59 ml/min (HCC) 12/21/2017  . GI bleed 12/21/2017  . Acute respiratory insufficiency   . Angioedema 07/28/2017  . Enterotoxigenic Escherichia coli infection 05/24/2015  . STEC (Shiga toxin-producing Escherichia coli) infection 05/24/2015  . Colitis 05/23/2015  . History of prostate cancer   . Chronic diarrhea of unknown origin   . CAD S/P percutaneous coronary angioplasty   . Type 2 diabetes mellitus with unspecified complications ([place])   .  Chest pain 04/08/2015  . Dyspnea 04/08/2015  . Palpitations 03/15/2015  . History of non-ST elevation myocardial infarction (NSTEMI) 02/22/2015  . Abnormal EKG 02/21/2015  . Essential hypertension 02/21/2015  . Dyslipidemia, goal LDL below 70 02/21/2015  . Malignant neoplasm of prostate ([place]) 08/07/2014  . Acute interstitial pneumonitis ([place]) 12/17/2013   Past Medical History:  Diagnosis Date  . Angio-edema   . Anxiety   . Arthritis   . CAD (coronary artery disease)    a. 02/2015: DES to mid-LAD  . GERD (gastroesophageal reflux disease)   . Gout   . High cholesterol   . History of prolonged Q-T interval on ECG   . Hx of Clostridium difficile infection   . Hx of umbilical hernia repair   . Personal history of digestive disease    gastric ulcer  . Prostate cancer ([place])   . Prostate enlargement   . Skin cancer    basal cell carcinoma  . Thyroid disease   . Urticaria     Family History  Problem Relation Age of Onset  . Cancer Mother   . Cancer Father        prostate  . Cancer Sister     Past Surgical History:  Procedure Laterality Date  . APPENDECTOMY    . BACK SURGERY    . CARDIAC CATHETERIZATION N/A 02/21/2015   Procedure: Left Heart Cath;  Surgeon: Lorretta Harp, MD;  Location: [facility];  Service: Cardiovascular;  Laterality: N/A;  . CATARACT EXTRACTION, BILATERAL    . NASAL SINUS SURGERY    . PROSTATE BIOPSY    .  right shoulder rotator cuff repair     Social History   Occupational History  . Occupation: retired  Tobacco Use  . Smoking status: Never Smoker  . Smokeless tobacco: Never Used  Substance and Sexual Activity  . Alcohol use: No  . Drug use: No  . Sexual activity: Not on file

## 2018-02-19 ENCOUNTER — Ambulatory Visit: Payer: Medicare Other | Admitting: Cardiovascular Disease

## 2018-03-12 ENCOUNTER — Ambulatory Visit (INDEPENDENT_AMBULATORY_CARE_PROVIDER_SITE_OTHER): Payer: Medicare Other | Admitting: Allergy and Immunology

## 2018-03-12 ENCOUNTER — Encounter: Payer: Self-pay | Admitting: Allergy and Immunology

## 2018-03-12 VITALS — BP 110/68 | HR 62 | Resp 18

## 2018-03-12 DIAGNOSIS — J3089 Other allergic rhinitis: Secondary | ICD-10-CM

## 2018-03-12 DIAGNOSIS — J849 Interstitial pulmonary disease, unspecified: Secondary | ICD-10-CM | POA: Diagnosis not present

## 2018-03-12 DIAGNOSIS — D841 Defects in the complement system: Secondary | ICD-10-CM

## 2018-03-12 DIAGNOSIS — Z9861 Coronary angioplasty status: Secondary | ICD-10-CM | POA: Diagnosis not present

## 2018-03-12 DIAGNOSIS — K219 Gastro-esophageal reflux disease without esophagitis: Secondary | ICD-10-CM

## 2018-03-12 DIAGNOSIS — I2510 Atherosclerotic heart disease of native coronary artery without angina pectoris: Secondary | ICD-10-CM

## 2018-03-12 NOTE — Patient Instructions (Signed)
  1.  Continue Takhzryo every 14 days.   2.  Continue to Treat and prevent inflammation:    A.  Flonase - 1 spray each nostril twice a day  B.  Azelastine - 1 spray each nostril twice a day  3.  Continue to Treat and prevent reflux:   A.  Slowly eliminate all caffeine consumption  B.  pantoprazole 40 mg twice a day  C.  famotidine 40 mg in evening  D.  OTC Mylanta  4.  If needed:   A.  Nasal saline spray  B.  OTC antihistamine - loratadine 10 mg 1 time per day  C.  OTC Mucinex DM - 2 tablets twice a day  D.  Firazyr injection for swelling reaction  5. Return to clinic in 6 months or earlier if problem

## 2018-03-12 NOTE — Progress Notes (Signed)
Follow-up Note  Referring Provider: [name], NP Primary Provider: [name], NP Date of Office Visit: 03/12/2018  Subjective:   Daniel Herrera (DOB: 1940/11/09) is a 77 y.o. male who returns to the [facility] on 03/12/2018 in re-evaluation of the following:  HPI: Daniel Herrera presents to this clinic in reevaluation of his type II hereditary angioedema treated with [employer] every 2 weeks and issues with allergic rhinitis and LPR and history of interstitial lung disease.  His last visit to this clinic was 11 September 2017.  He fractured his left shoulder and did develop localized swelling after that trauma requiring him to use Firazyr.  As well, he did have another episode of slight facial and leg swelling requiring him to use another injection of Firazyr mid October 2019.  Otherwise, he has not had any significant swelling reactions and does not appear to have any adverse effects from utilizing his biological agent.  He still occasionally gets some issues with nasal congestion and nose blowing but overall is doing better while utilizing his to nasal sprays.  His reflux is better at this point in time while utilizing a combination of proton pump inhibitor and H2 receptor blocker and adding in Mylanta.  He did have a ENT evaluation of his throat and apparently he has vocal cord nodules giving rise to some of his raspiness.  He refuses to receive the flu vaccine.  Allergies as of 03/12/2018      Reactions   Lisinopril Swelling   Facial and lip swelling. And throat swelling.    Benadryl [diphenhydramine Hcl] Swelling   Facial swelling from high dose (tolerates Advil PM as needed)   Darvocet [propoxyphene N-acetaminophen] Other (See Comments)   hallucination   Darvon [propoxyphene Hcl] Other (See Comments)   Hallucination   Tylenol [acetaminophen] Swelling   Foot swelling      Medication List      acetaminophen 500 MG tablet Commonly known as:  TYLENOL Take 1  tablet (500 mg total) by mouth every 6 (six) hours as needed.   aspirin EC 81 MG tablet Take 1 tablet (81 mg total) by mouth daily.   azelastine 0.1 % nasal spray Commonly known as:  ASTELIN Place 1 spray into both nostrils 2 (two) times daily.   Cetirizine HCl 10 MG Caps Take 1 capsule (10 mg total) by mouth at bedtime and may repeat dose one time if needed.   EPINEPHrine 0.3 mg/0.3 mL Soaj injection Commonly known as:  EPI-PEN Inject 0.3 mg into the muscle as needed (for allergic reaction).   FIRAZYR 30 MG/3ML injection Generic drug:  icatibant Inject 30 mg into the skin once as needed (edema).   fluticasone 50 MCG/ACT nasal spray Commonly known as:  FLONASE Place 1 spray into both nostrils daily.   levothyroxine 75 MCG tablet Commonly known as:  SYNTHROID, LEVOTHROID Take 75 mcg by mouth daily before breakfast.   LIVALO 4 MG Tabs Generic drug:  Pitavastatin Calcium Take 4 mg by mouth at bedtime.   metoprolol tartrate 25 MG tablet Commonly known as:  LOPRESSOR Take 1 tablet (25 mg total) by mouth 2 (two) times daily.   montelukast 10 MG tablet Commonly known as:  SINGULAIR Take 1 tablet (10 mg total) by mouth at bedtime.   naproxen 375 MG tablet Commonly known as:  NAPROSYN Take 1 tablet (375 mg total) by mouth 2 (two) times daily.   nitroGLYCERIN 0.4 MG SL tablet Commonly known as:  NITROSTAT DISSOLVE 1 TABLET UNDER TONGUE EVERY 5 MINUTES FOR 3 DOSES AS NEEDED CHEST PAIN. IF NO RELIEF CALL 911   pantoprazole 40 MG tablet Commonly known as:  PROTONIX Take 1 tablet (40 mg total) by mouth 2 (two) times daily.   polyethylene glycol packet Commonly known as:  MIRALAX / GLYCOLAX Take 17 g by mouth daily.   SYSTANE OP Place 1 drop into both eyes 2 (two) times daily.   UNABLE TO FIND Takhzryo injected into the skin every 2 weeks       Past Medical History:  Diagnosis Date  . Angio-edema   . Anxiety   . Arthritis   . CAD (coronary artery disease)    a.  02/2015: DES to mid-LAD  . GERD (gastroesophageal reflux disease)   . Gout   . High cholesterol   . History of prolonged Q-T interval on ECG   . Hx of Clostridium difficile infection   . Hx of umbilical hernia repair   . Personal history of digestive disease    gastric ulcer  . Prostate cancer ([place])   . Prostate enlargement   . Skin cancer    basal cell carcinoma  . Thyroid disease   . Urticaria     Past Surgical History:  Procedure Laterality Date  . APPENDECTOMY    . BACK SURGERY    . CARDIAC CATHETERIZATION N/A 02/21/2015   Procedure: Left Heart Cath;  Surgeon: Lorretta Harp, MD;  Location: [facility];  Service: Cardiovascular;  Laterality: N/A;  . CATARACT EXTRACTION, BILATERAL    . NASAL SINUS SURGERY    . PROSTATE BIOPSY    . right shoulder rotator cuff repair      Review of systems negative except as noted in HPI / PMHx or noted below:  Review of Systems  Constitutional: Negative.   HENT: Negative.   Eyes: Negative.   Respiratory: Negative.   Cardiovascular: Negative.   Gastrointestinal: Negative.   Genitourinary: Negative.   Musculoskeletal: Negative.   Skin: Negative.   Neurological: Negative.   Endo/Heme/Allergies: Negative.   Psychiatric/Behavioral: Negative.      Objective:   Vitals:   03/12/18 1035  BP: 110/68  Pulse: 62  Resp: 18  SpO2: 98%          Physical Exam  HENT:  Head: Normocephalic.  Right Ear: Tympanic membrane, external ear and ear canal normal.  Left Ear: Tympanic membrane, external ear and ear canal normal.  Nose: Nose normal. No mucosal edema or rhinorrhea.  Mouth/Throat: Uvula is midline, oropharynx is clear and moist and mucous membranes are normal. No oropharyngeal exudate.  Eyes: Conjunctivae are normal.  Neck: Trachea normal. No tracheal tenderness present. No tracheal deviation present. No thyromegaly present.  Cardiovascular: Normal rate, regular rhythm, S1 normal, S2 normal and normal heart sounds.  No  murmur heard. Pulmonary/Chest: Breath sounds normal. No stridor. No respiratory distress. He has no wheezes. He has no rales.  Musculoskeletal: He exhibits no edema.  Left arm brace  Lymphadenopathy:       Head (right side): No tonsillar adenopathy present.       Head (left side): No tonsillar adenopathy present.    He has no cervical adenopathy.  Neurological: He is alert.  Skin: No rash noted. He is not diaphoretic. No erythema. Nails show no clubbing.    Diagnostics: none   Assessment and Plan:   1. Hereditary angioedema type 2 ([place])   2. Perennial allergic rhinitis   3. LPRD (  laryngopharyngeal reflux disease)   4. ILD (interstitial lung disease) ([place])     1.  Continue Takhzryo every 14 days.   2.  Continue to Treat and prevent inflammation:    A.  Flonase - 1 spray each nostril twice a day  B.  Azelastine - 1 spray each nostril twice a day  3.  Continue to Treat and prevent reflux:   A.  Slowly eliminate all caffeine consumption  B.  pantoprazole 40 mg twice a day  C.  famotidine 40 mg in evening  D.  OTC Mylanta  4.  If needed:   A.  Nasal saline spray  B.  OTC antihistamine - loratadine 10 mg 1 time per day  C.  OTC Mucinex DM - 2 tablets twice a day  D.  Firazyr injection for swelling reaction  5. Return to clinic in 6 months or earlier if problem  Daniel Herrera is doing relatively well regarding all of his issues including his type II hereditary angioedema and his allergic rhinitis and LPR and he will maintain his current plan as noted above to address these issues.  I have not addressed his interstitial lung disease as he sees Dr. Elsworth Soho for this issue.  I will see him back in this clinic in 6 months or earlier if there is a problem.  Allena Katz, MD [facility]

## 2018-03-13 ENCOUNTER — Ambulatory Visit (INDEPENDENT_AMBULATORY_CARE_PROVIDER_SITE_OTHER): Payer: Medicare Other

## 2018-03-13 ENCOUNTER — Encounter: Payer: Self-pay | Admitting: Allergy and Immunology

## 2018-03-13 ENCOUNTER — Ambulatory Visit (INDEPENDENT_AMBULATORY_CARE_PROVIDER_SITE_OTHER): Payer: Medicare Other | Admitting: Orthopaedic Surgery

## 2018-03-13 ENCOUNTER — Encounter (INDEPENDENT_AMBULATORY_CARE_PROVIDER_SITE_OTHER): Payer: Self-pay | Admitting: Orthopaedic Surgery

## 2018-03-13 DIAGNOSIS — S42022D Displaced fracture of shaft of left clavicle, subsequent encounter for fracture with routine healing: Secondary | ICD-10-CM | POA: Diagnosis not present

## 2018-03-13 NOTE — Progress Notes (Signed)
The patient is a very active 77 year old gentleman who is 5 weeks status post a left clavicle fracture.  He does wear a sling when he sleeps at night and occasionally during the day.  He says is not too painful unless he reaches way overhead or behind him.  He is very active 77 year old gentleman.  On exam, there is no soft tissue compromise of the left shoulder at all with the clavicle fracture is.  He is got good motion motion of the shoulder as well.  2 views of the left clavicle are seen and show comminution of the fracture with no significant displacement.  It is difficult to tell if there is been interval healing.  Based on clinical exam he is having healing though.  He can come out of the sling in the next week or 2 as comfort allows.  He will continue increase his activities as well as comfort allows.  We will see him back for potentially final visit in 6 weeks with a repeat 3 views of his left clavicle.

## 2018-03-14 DIAGNOSIS — E785 Hyperlipidemia, unspecified: Secondary | ICD-10-CM | POA: Diagnosis not present

## 2018-03-14 DIAGNOSIS — E119 Type 2 diabetes mellitus without complications: Secondary | ICD-10-CM | POA: Diagnosis not present

## 2018-03-14 DIAGNOSIS — M3500 Sicca syndrome, unspecified: Secondary | ICD-10-CM | POA: Diagnosis not present

## 2018-03-14 DIAGNOSIS — I2510 Atherosclerotic heart disease of native coronary artery without angina pectoris: Secondary | ICD-10-CM | POA: Diagnosis not present

## 2018-03-19 DIAGNOSIS — L918 Other hypertrophic disorders of the skin: Secondary | ICD-10-CM | POA: Diagnosis not present

## 2018-03-19 DIAGNOSIS — C44311 Basal cell carcinoma of skin of nose: Secondary | ICD-10-CM | POA: Diagnosis not present

## 2018-03-19 DIAGNOSIS — L219 Seborrheic dermatitis, unspecified: Secondary | ICD-10-CM | POA: Diagnosis not present

## 2018-03-19 DIAGNOSIS — L570 Actinic keratosis: Secondary | ICD-10-CM | POA: Diagnosis not present

## 2018-03-25 DIAGNOSIS — Z23 Encounter for immunization: Secondary | ICD-10-CM | POA: Diagnosis not present

## 2018-04-03 DIAGNOSIS — I509 Heart failure, unspecified: Secondary | ICD-10-CM | POA: Insufficient documentation

## 2018-04-03 HISTORY — DX: Heart failure, unspecified: I50.9

## 2018-04-06 DIAGNOSIS — Z23 Encounter for immunization: Secondary | ICD-10-CM | POA: Diagnosis not present

## 2018-04-24 ENCOUNTER — Ambulatory Visit (INDEPENDENT_AMBULATORY_CARE_PROVIDER_SITE_OTHER): Payer: Medicare Other | Admitting: Orthopaedic Surgery

## 2018-04-24 ENCOUNTER — Ambulatory Visit (INDEPENDENT_AMBULATORY_CARE_PROVIDER_SITE_OTHER): Payer: Medicare Other

## 2018-04-24 ENCOUNTER — Encounter (INDEPENDENT_AMBULATORY_CARE_PROVIDER_SITE_OTHER): Payer: Self-pay | Admitting: Orthopaedic Surgery

## 2018-04-24 DIAGNOSIS — S42022D Displaced fracture of shaft of left clavicle, subsequent encounter for fracture with routine healing: Secondary | ICD-10-CM

## 2018-04-24 NOTE — Progress Notes (Signed)
The patient is here today for continued follow-up for lateral clavicle fracture he 78 years old.  He denies any shoulder pain at all at this point but says the shoulder does pop on him but he has no issues with it.  On exam his range of motion is full in his left shoulder.  There is some pain to stressing the lateral clavicle of the left shoulder but there is no soft tissue compromise and there is some slight popping in that area.  X-rays of the left clavicle show a comminuted lateral clavicle fracture with some interval healing.  At this point since he is pain-free he will follow-up as needed.  There is no issues with him and on his not an athletic 78 year old and he says is not bothering his day-to-day activities.

## 2018-05-02 ENCOUNTER — Telehealth: Payer: Self-pay

## 2018-05-02 NOTE — Telephone Encounter (Signed)
Patient called requesting information about his Haegarda injection and how to set that up.  After consulting with Eustaquio Maize, we have the patients contact information and are going to reach out to [name] to see if she has information on this patient.  Patient is aware that someone will be calling him back with information.

## 2018-05-14 DIAGNOSIS — L570 Actinic keratosis: Secondary | ICD-10-CM | POA: Diagnosis not present

## 2018-05-14 DIAGNOSIS — L821 Other seborrheic keratosis: Secondary | ICD-10-CM | POA: Diagnosis not present

## 2018-05-14 DIAGNOSIS — L219 Seborrheic dermatitis, unspecified: Secondary | ICD-10-CM | POA: Diagnosis not present

## 2018-05-14 DIAGNOSIS — L578 Other skin changes due to chronic exposure to nonionizing radiation: Secondary | ICD-10-CM | POA: Diagnosis not present

## 2018-05-15 ENCOUNTER — Telehealth: Payer: Self-pay | Admitting: Gastroenterology

## 2018-05-15 NOTE — Telephone Encounter (Signed)
Pt called wanting to schedule his colonoscopy but he was under the impression that it needed to be at the hospital. Could you pls advise on scheduling? Pt is aware that Dr. Lyndel Safe will be back next week and we will contact him then.

## 2018-05-16 ENCOUNTER — Ambulatory Visit (INDEPENDENT_AMBULATORY_CARE_PROVIDER_SITE_OTHER): Payer: Medicare Other | Admitting: Pulmonary Disease

## 2018-05-16 ENCOUNTER — Encounter: Payer: Self-pay | Admitting: Pulmonary Disease

## 2018-05-16 DIAGNOSIS — D841 Defects in the complement system: Secondary | ICD-10-CM | POA: Diagnosis not present

## 2018-05-16 DIAGNOSIS — J84114 Acute interstitial pneumonitis: Secondary | ICD-10-CM | POA: Diagnosis not present

## 2018-05-16 DIAGNOSIS — J849 Interstitial pulmonary disease, unspecified: Secondary | ICD-10-CM

## 2018-05-16 NOTE — Telephone Encounter (Signed)
Please review previous message and advise 

## 2018-05-16 NOTE — Progress Notes (Signed)
   Subjective:    Patient ID: Daniel Herrera, male    DOB: 01-05-1941, 78 y.o.   MRN: [id number]  HPI  78 yo never smoker for FU of interstitial pneumonia  Past medical history-includes prostate CA,C diff colitis and SVTs that stop with vagal maneuvers, had a jittery angioedema    Chief Complaint  Patient presents with  . Follow-up    1 yr f/u for ILD. States his breathing has been ok since last visit.    He presented in 2015 with dyspnea for a few months, PFTs were normal except for mildly decreased DLCO .  CT chest at [facility] showed mild interstitial prominence. This was activated to check chicken farm exposure or GERD related pneumonitis  CT abdomen from 05/2015 showed  dependent changes in both lung bases and mild bronchiectasis. He has been well from a pulmonary standpoint complains of mild dyspnea but had an uneventful year otherwise. He had a significant episode of angioedema requiring hospitalization to the ICU, has follow-up with allergist Kozlow, saw ENT and was told that he had vocal nodules but did not need surgery Lisinopril was stopped  He also had a fall and left shoulder fracture   Significant tests/ events reviewed CT chest without contrast 11/2013 showed interstitial prominence particularly apical with mild bronchiectasis, small patchy infiltrate was noted in the right lower lobe with a tiny effusion. ESR ,CCP neg PFTs 02/2014 - nml FEV1/ FVC & ratio, TLC 81%, DLCO 65%  Review of Systems Patient denies significant dyspnea,cough, hemoptysis,  chest pain, palpitations, pedal edema, orthopnea, paroxysmal nocturnal dyspnea, lightheadedness, nausea, vomiting, abdominal or  leg pains      Objective:   Physical Exam  Gen. Pleasant, well-nourished, in no distress ENT - no thrush, no pallor/icterus,no post nasal drip Neck: No JVD, no thyromegaly, no carotid bruits Lungs: no use of accessory muscles, no dullness to percussion, fine rt basal rales no rhonchi    Cardiovascular: Rhythm regular, heart sounds  normal, no murmurs or gallops, no peripheral edema Musculoskeletal: No deformities, no cyanosis or clubbing        Assessment & Plan:

## 2018-05-16 NOTE — Assessment & Plan Note (Signed)
Given persistent right lower lobe crackles, will pursue another high-resolution CT chest to clarify whether there is any ILD or not.  He may have mild bronchiectasis on his prior CT abdomen. His acute episode in 2015 seems to have been related to an acute pneumonitis

## 2018-05-16 NOTE — Assessment & Plan Note (Signed)
Lisinopril has been stopped

## 2018-05-16 NOTE — Patient Instructions (Signed)
Schedule high-resolution CT chest without contrast

## 2018-05-20 ENCOUNTER — Other Ambulatory Visit: Payer: Self-pay | Admitting: *Deleted

## 2018-05-20 ENCOUNTER — Other Ambulatory Visit: Payer: Self-pay

## 2018-05-20 ENCOUNTER — Encounter (HOSPITAL_COMMUNITY): Payer: Self-pay | Admitting: *Deleted

## 2018-05-20 ENCOUNTER — Emergency Department (HOSPITAL_COMMUNITY): Payer: Medicare Other

## 2018-05-20 ENCOUNTER — Inpatient Hospital Stay (HOSPITAL_COMMUNITY)
Admission: EM | Admit: 2018-05-20 | Discharge: 2018-05-30 | DRG: 234 | Disposition: A | Discharge status: Home Health | Payer: Medicare Other | Attending: Surgery | Admitting: Surgery

## 2018-05-20 DIAGNOSIS — I480 Paroxysmal atrial fibrillation: Secondary | ICD-10-CM | POA: Diagnosis present

## 2018-05-20 DIAGNOSIS — D62 Acute posthemorrhagic anemia: Secondary | ICD-10-CM | POA: Diagnosis not present

## 2018-05-20 DIAGNOSIS — I129 Hypertensive chronic kidney disease with stage 1 through stage 4 chronic kidney disease, or unspecified chronic kidney disease: Secondary | ICD-10-CM | POA: Diagnosis present

## 2018-05-20 DIAGNOSIS — D696 Thrombocytopenia, unspecified: Secondary | ICD-10-CM | POA: Diagnosis present

## 2018-05-20 DIAGNOSIS — I214 Non-ST elevation (NSTEMI) myocardial infarction: Secondary | ICD-10-CM | POA: Diagnosis present

## 2018-05-20 DIAGNOSIS — Z7952 Long term (current) use of systemic steroids: Secondary | ICD-10-CM

## 2018-05-20 DIAGNOSIS — Z955 Presence of coronary angioplasty implant and graft: Secondary | ICD-10-CM

## 2018-05-20 DIAGNOSIS — I2510 Atherosclerotic heart disease of native coronary artery without angina pectoris: Secondary | ICD-10-CM

## 2018-05-20 DIAGNOSIS — Z791 Long term (current) use of non-steroidal anti-inflammatories (NSAID): Secondary | ICD-10-CM

## 2018-05-20 DIAGNOSIS — Z01810 Encounter for preprocedural cardiovascular examination: Secondary | ICD-10-CM

## 2018-05-20 DIAGNOSIS — E7800 Pure hypercholesterolemia, unspecified: Secondary | ICD-10-CM | POA: Diagnosis not present

## 2018-05-20 DIAGNOSIS — K219 Gastro-esophageal reflux disease without esophagitis: Secondary | ICD-10-CM | POA: Diagnosis present

## 2018-05-20 DIAGNOSIS — R079 Chest pain, unspecified: Secondary | ICD-10-CM | POA: Diagnosis not present

## 2018-05-20 DIAGNOSIS — Z85828 Personal history of other malignant neoplasm of skin: Secondary | ICD-10-CM

## 2018-05-20 DIAGNOSIS — Z951 Presence of aortocoronary bypass graft: Secondary | ICD-10-CM

## 2018-05-20 DIAGNOSIS — E039 Hypothyroidism, unspecified: Secondary | ICD-10-CM | POA: Diagnosis not present

## 2018-05-20 DIAGNOSIS — Z79890 Hormone replacement therapy (postmenopausal): Secondary | ICD-10-CM

## 2018-05-20 DIAGNOSIS — E1122 Type 2 diabetes mellitus with diabetic chronic kidney disease: Secondary | ICD-10-CM | POA: Diagnosis present

## 2018-05-20 DIAGNOSIS — R51 Headache: Secondary | ICD-10-CM | POA: Diagnosis not present

## 2018-05-20 DIAGNOSIS — Z7982 Long term (current) use of aspirin: Secondary | ICD-10-CM

## 2018-05-20 DIAGNOSIS — N400 Enlarged prostate without lower urinary tract symptoms: Secondary | ICD-10-CM | POA: Diagnosis present

## 2018-05-20 DIAGNOSIS — E785 Hyperlipidemia, unspecified: Secondary | ICD-10-CM | POA: Diagnosis present

## 2018-05-20 DIAGNOSIS — Y831 Surgical operation with implant of artificial internal device as the cause of abnormal reaction of the patient, or of later complication, without mention of misadventure at the time of the procedure: Secondary | ICD-10-CM | POA: Diagnosis present

## 2018-05-20 DIAGNOSIS — T82855A Stenosis of coronary artery stent, initial encounter: Secondary | ICD-10-CM | POA: Diagnosis not present

## 2018-05-20 DIAGNOSIS — Z79899 Other long term (current) drug therapy: Secondary | ICD-10-CM

## 2018-05-20 DIAGNOSIS — N183 Chronic kidney disease, stage 3 (moderate): Secondary | ICD-10-CM | POA: Diagnosis present

## 2018-05-20 DIAGNOSIS — Z8546 Personal history of malignant neoplasm of prostate: Secondary | ICD-10-CM

## 2018-05-20 DIAGNOSIS — E781 Pure hyperglyceridemia: Secondary | ICD-10-CM | POA: Diagnosis present

## 2018-05-20 DIAGNOSIS — D841 Defects in the complement system: Secondary | ICD-10-CM | POA: Diagnosis present

## 2018-05-20 LAB — BASIC METABOLIC PANEL
Anion gap: 10 (ref 5–15)
BUN: 22 mg/dL (ref 8–23)
CHLORIDE: 106 mmol/L (ref 98–111)
CO2: 20 mmol/L — ABNORMAL LOW (ref 22–32)
CREATININE: 1.59 mg/dL — AB (ref 0.61–1.24)
Calcium: 9.3 mg/dL (ref 8.9–10.3)
GFR calc Af Amer: 47 mL/min — ABNORMAL LOW (ref >60)
GFR calc non Af Amer: 41 mL/min — ABNORMAL LOW (ref >60)
Glucose, Bld: 240 mg/dL — ABNORMAL HIGH (ref 70–99)
POTASSIUM: 3.7 mmol/L (ref 3.5–5.1)
SODIUM: 136 mmol/L (ref 135–145)

## 2018-05-20 LAB — CBC
HEMATOCRIT: 45.5 % (ref 39.0–52.0)
HEMOGLOBIN: 14.6 g/dL (ref 13.0–17.0)
MCH: 29.6 pg (ref 26.0–34.0)
MCHC: 32.1 g/dL (ref 30.0–36.0)
MCV: 92.1 fL (ref 80.0–100.0)
Platelets: 142 10*3/uL — ABNORMAL LOW (ref 150–400)
RBC: 4.94 MIL/uL (ref 4.22–5.81)
RDW: 12.8 % (ref 11.5–15.5)
WBC: 5.1 10*3/uL (ref 4.0–10.5)
nRBC: 0 % (ref 0.0–0.2)

## 2018-05-20 LAB — I-STAT TROPONIN, ED: Troponin i, poc: 0.04 ng/mL (ref 0.00–0.08)

## 2018-05-20 MED ORDER — SODIUM CHLORIDE 0.9% FLUSH
3.0000 mL | Freq: Once | INTRAVENOUS | Status: AC
  Administered 2018-05-23: 3 mL via INTRAVENOUS

## 2018-05-20 MED ORDER — EPINEPHRINE 0.3 MG/0.3ML IJ SOAJ: 0.3000 mg | INTRAMUSCULAR | 0 refills | Status: DC | PRN

## 2018-05-20 NOTE — Telephone Encounter (Signed)
Just by reviewing his chart-should qualify for [place] for colon Can you also run it by Jenny Reichmann Art therapist) to make sure Thx

## 2018-05-20 NOTE — Telephone Encounter (Signed)
Will you please review this patient's chart and inform if the patient can have his procedure at [facility] or if it needs to be scheduled at [facility]?

## 2018-05-20 NOTE — Telephone Encounter (Signed)
Brianna,  This pt is cleared for anesthetic care at [facility].  Thanks,  Osvaldo Angst CRNA

## 2018-05-20 NOTE — Telephone Encounter (Signed)
[facility] advised patient had stated to them that his epipen had expired and he is set receive his Haegarda to get started on. I called patient and advised him I would send rx to [facility]

## 2018-05-20 NOTE — ED Triage Notes (Signed)
Pt started having sharp centralized chest pain 2 hours ago that has improved with medication. Reports SOB at the time of the pain. Hx of MI, takes 81mg  ASA daily

## 2018-05-21 DIAGNOSIS — E785 Hyperlipidemia, unspecified: Secondary | ICD-10-CM | POA: Diagnosis not present

## 2018-05-21 DIAGNOSIS — Z8546 Personal history of malignant neoplasm of prostate: Secondary | ICD-10-CM | POA: Diagnosis not present

## 2018-05-21 DIAGNOSIS — Z951 Presence of aortocoronary bypass graft: Secondary | ICD-10-CM | POA: Diagnosis not present

## 2018-05-21 DIAGNOSIS — Z7952 Long term (current) use of systemic steroids: Secondary | ICD-10-CM | POA: Diagnosis not present

## 2018-05-21 DIAGNOSIS — I2510 Atherosclerotic heart disease of native coronary artery without angina pectoris: Secondary | ICD-10-CM | POA: Diagnosis not present

## 2018-05-21 DIAGNOSIS — Z79899 Other long term (current) drug therapy: Secondary | ICD-10-CM | POA: Diagnosis not present

## 2018-05-21 DIAGNOSIS — R000 Tachycardia, unspecified: Secondary | ICD-10-CM | POA: Diagnosis not present

## 2018-05-21 DIAGNOSIS — I214 Non-ST elevation (NSTEMI) myocardial infarction: Principal | ICD-10-CM

## 2018-05-21 DIAGNOSIS — N400 Enlarged prostate without lower urinary tract symptoms: Secondary | ICD-10-CM | POA: Diagnosis present

## 2018-05-21 DIAGNOSIS — R079 Chest pain, unspecified: Secondary | ICD-10-CM | POA: Diagnosis not present

## 2018-05-21 DIAGNOSIS — I25110 Atherosclerotic heart disease of native coronary artery with unstable angina pectoris: Secondary | ICD-10-CM | POA: Diagnosis not present

## 2018-05-21 DIAGNOSIS — Z85828 Personal history of other malignant neoplasm of skin: Secondary | ICD-10-CM | POA: Diagnosis not present

## 2018-05-21 DIAGNOSIS — D62 Acute posthemorrhagic anemia: Secondary | ICD-10-CM | POA: Diagnosis not present

## 2018-05-21 DIAGNOSIS — N183 Chronic kidney disease, stage 3 (moderate): Secondary | ICD-10-CM | POA: Diagnosis not present

## 2018-05-21 DIAGNOSIS — D72819 Decreased white blood cell count, unspecified: Secondary | ICD-10-CM | POA: Diagnosis present

## 2018-05-21 DIAGNOSIS — E7800 Pure hypercholesterolemia, unspecified: Secondary | ICD-10-CM | POA: Diagnosis not present

## 2018-05-21 DIAGNOSIS — E1122 Type 2 diabetes mellitus with diabetic chronic kidney disease: Secondary | ICD-10-CM | POA: Diagnosis not present

## 2018-05-21 DIAGNOSIS — E1159 Type 2 diabetes mellitus with other circulatory complications: Secondary | ICD-10-CM | POA: Diagnosis not present

## 2018-05-21 DIAGNOSIS — I129 Hypertensive chronic kidney disease with stage 1 through stage 4 chronic kidney disease, or unspecified chronic kidney disease: Secondary | ICD-10-CM | POA: Diagnosis not present

## 2018-05-21 DIAGNOSIS — R51 Headache: Secondary | ICD-10-CM | POA: Diagnosis not present

## 2018-05-21 DIAGNOSIS — Z79890 Hormone replacement therapy (postmenopausal): Secondary | ICD-10-CM | POA: Diagnosis not present

## 2018-05-21 DIAGNOSIS — T82855A Stenosis of coronary artery stent, initial encounter: Secondary | ICD-10-CM | POA: Diagnosis not present

## 2018-05-21 DIAGNOSIS — J9811 Atelectasis: Secondary | ICD-10-CM | POA: Diagnosis not present

## 2018-05-21 DIAGNOSIS — E039 Hypothyroidism, unspecified: Secondary | ICD-10-CM | POA: Diagnosis not present

## 2018-05-21 DIAGNOSIS — I340 Nonrheumatic mitral (valve) insufficiency: Secondary | ICD-10-CM | POA: Diagnosis not present

## 2018-05-21 DIAGNOSIS — Y831 Surgical operation with implant of artificial internal device as the cause of abnormal reaction of the patient, or of later complication, without mention of misadventure at the time of the procedure: Secondary | ICD-10-CM | POA: Diagnosis present

## 2018-05-21 DIAGNOSIS — K219 Gastro-esophageal reflux disease without esophagitis: Secondary | ICD-10-CM | POA: Diagnosis present

## 2018-05-21 DIAGNOSIS — D696 Thrombocytopenia, unspecified: Secondary | ICD-10-CM | POA: Diagnosis present

## 2018-05-21 DIAGNOSIS — Z7951 Long term (current) use of inhaled steroids: Secondary | ICD-10-CM | POA: Diagnosis not present

## 2018-05-21 DIAGNOSIS — I480 Paroxysmal atrial fibrillation: Secondary | ICD-10-CM | POA: Diagnosis present

## 2018-05-21 DIAGNOSIS — Z01810 Encounter for preprocedural cardiovascular examination: Secondary | ICD-10-CM | POA: Diagnosis not present

## 2018-05-21 DIAGNOSIS — Z7982 Long term (current) use of aspirin: Secondary | ICD-10-CM | POA: Diagnosis not present

## 2018-05-21 DIAGNOSIS — J90 Pleural effusion, not elsewhere classified: Secondary | ICD-10-CM | POA: Diagnosis not present

## 2018-05-21 DIAGNOSIS — Z955 Presence of coronary angioplasty implant and graft: Secondary | ICD-10-CM | POA: Diagnosis not present

## 2018-05-21 HISTORY — DX: Non-ST elevation (NSTEMI) myocardial infarction: I21.4

## 2018-05-21 LAB — I-STAT TROPONIN, ED: Troponin i, poc: 0.59 ng/mL (ref 0.00–0.08)

## 2018-05-21 LAB — TROPONIN I
Troponin I: 0.59 ng/mL (ref <0.03)
Troponin I: 0.38 ng/mL (ref <0.03)

## 2018-05-21 LAB — TSH: TSH: 3.205 u[IU]/mL (ref 0.350–4.500)

## 2018-05-21 LAB — HEPARIN LEVEL (UNFRACTIONATED): Heparin Unfractionated: 0.48 IU/mL (ref 0.30–0.70)

## 2018-05-21 MED ORDER — SODIUM CHLORIDE 0.9 % WEIGHT BASED INFUSION
1.0000 mL/kg/h | INTRAVENOUS | Status: DC
  Administered 2018-05-21 – 2018-05-22 (×2): 1 mL/kg/h via INTRAVENOUS

## 2018-05-21 MED ORDER — PANTOPRAZOLE SODIUM 40 MG PO TBEC
40.0000 mg | DELAYED_RELEASE_TABLET | Freq: Two times a day (BID) | ORAL | Status: DC
  Administered 2018-05-21 – 2018-05-23 (×5): 40 mg via ORAL
  Filled 2018-05-21 (×5): qty 1

## 2018-05-21 MED ORDER — SODIUM CHLORIDE 0.9 % IV SOLN: 250.0000 mL | INTRAVENOUS | Status: DC | PRN

## 2018-05-21 MED ORDER — SODIUM CHLORIDE 0.9 % IV SOLN
INTRAVENOUS | Status: DC
  Administered 2018-05-21 – 2018-05-24 (×3): via INTRAVENOUS

## 2018-05-21 MED ORDER — SODIUM CHLORIDE 0.9% FLUSH: 3.0000 mL | INTRAVENOUS | Status: DC | PRN

## 2018-05-21 MED ORDER — LEVOTHYROXINE SODIUM 75 MCG PO TABS
75.0000 ug | ORAL_TABLET | Freq: Every day | ORAL | Status: DC
  Administered 2018-05-22 – 2018-05-30 (×9): 75 ug via ORAL
  Filled 2018-05-21 (×9): qty 1

## 2018-05-21 MED ORDER — ASPIRIN 81 MG PO CHEW
81.0000 mg | CHEWABLE_TABLET | ORAL | Status: AC
  Administered 2018-05-22: 81 mg via ORAL
  Filled 2018-05-21: qty 1

## 2018-05-21 MED ORDER — HEPARIN BOLUS VIA INFUSION
4000.0000 [IU] | Freq: Once | INTRAVENOUS | Status: AC
  Administered 2018-05-21: 4000 [IU] via INTRAVENOUS
  Filled 2018-05-21: qty 4000

## 2018-05-21 MED ORDER — FLUTICASONE PROPIONATE 50 MCG/ACT NA SUSP
1.0000 | Freq: Every day | NASAL | Status: DC
  Administered 2018-05-22 – 2018-05-30 (×8): 1 via NASAL
  Filled 2018-05-21 (×2): qty 16

## 2018-05-21 MED ORDER — ASPIRIN 81 MG PO CHEW
324.0000 mg | CHEWABLE_TABLET | Freq: Once | ORAL | Status: AC
  Administered 2018-05-21: 324 mg via ORAL
  Filled 2018-05-21: qty 4

## 2018-05-21 MED ORDER — AZELASTINE HCL 0.1 % NA SOLN
1.0000 | Freq: Two times a day (BID) | NASAL | Status: DC
  Administered 2018-05-21 – 2018-05-30 (×11): 1 via NASAL
  Filled 2018-05-21 (×2): qty 30

## 2018-05-21 MED ORDER — SODIUM CHLORIDE 0.9% FLUSH: 3.0000 mL | Freq: Two times a day (BID) | INTRAVENOUS | Status: DC

## 2018-05-21 MED ORDER — NITROGLYCERIN 0.4 MG SL SUBL: 0.4000 mg | SUBLINGUAL_TABLET | SUBLINGUAL | Status: DC | PRN

## 2018-05-21 MED ORDER — HEPARIN SODIUM (PORCINE) 5000 UNIT/ML IJ SOLN: 4000.0000 [IU] | Freq: Once | INTRAMUSCULAR | Status: DC

## 2018-05-21 MED ORDER — PRAVASTATIN SODIUM 40 MG PO TABS
80.0000 mg | ORAL_TABLET | Freq: Every day | ORAL | Status: DC
  Administered 2018-05-21 – 2018-05-29 (×8): 80 mg via ORAL
  Filled 2018-05-21 (×8): qty 2

## 2018-05-21 MED ORDER — ONDANSETRON HCL 4 MG/2ML IJ SOLN: 4.0000 mg | Freq: Four times a day (QID) | INTRAMUSCULAR | Status: DC | PRN

## 2018-05-21 MED ORDER — METOPROLOL TARTRATE 25 MG PO TABS
25.0000 mg | ORAL_TABLET | Freq: Two times a day (BID) | ORAL | Status: DC
  Administered 2018-05-21 – 2018-05-23 (×5): 25 mg via ORAL
  Filled 2018-05-21 (×5): qty 1

## 2018-05-21 MED ORDER — HEPARIN (PORCINE) 25000 UT/250ML-% IV SOLN
1200.0000 [IU]/h | INTRAVENOUS | Status: DC
  Administered 2018-05-21 – 2018-05-22 (×2): 1200 [IU]/h via INTRAVENOUS
  Filled 2018-05-21 (×2): qty 250

## 2018-05-21 MED ORDER — ASPIRIN EC 81 MG PO TBEC
81.0000 mg | DELAYED_RELEASE_TABLET | Freq: Every day | ORAL | Status: DC
  Administered 2018-05-22 – 2018-05-23 (×2): 81 mg via ORAL
  Filled 2018-05-21 (×2): qty 1

## 2018-05-21 MED ORDER — NABUMETONE 500 MG PO TABS
750.0000 mg | ORAL_TABLET | Freq: Every day | ORAL | Status: DC
  Administered 2018-05-22: 750 mg via ORAL
  Filled 2018-05-21: qty 2

## 2018-05-21 MED ORDER — ONDANSETRON HCL 4 MG PO TABS: 4.0000 mg | ORAL_TABLET | Freq: Four times a day (QID) | ORAL | Status: DC | PRN

## 2018-05-21 NOTE — H&P (Addendum)
CARDIOLOGY HISTORY AND PHYSICAL   Patient ID: Daniel Herrera MRN: [id number]  DOB/AGE: [id number] 80 y.o. Admit date: 05/20/2018  Primary Care Physician: [name], NP Primary Cardiologist: Dr. Quay Burow  Clinical Summary Daniel Herrera is a 78 y.o.male with known CAD status post drug-eluting stent to the mid and proximal LAD in 2016 who presented to the emergency department with severe substernal chest pressure. He states that on 2/17 around 5 PM he began to experience substernal chest pain associated with palpitations. He took his blood pressure and heart rate and both were elevated. He does not remember the exact numbers. Then called his daughter who brought him to the emergency department for further evaluation. The pain persisted until approximately 11 o'clock last night when spontaneously resolved. He is not had any recurrence of the pain since then. His initial troponin will in triage was negative however repeat troponin nine hours later was elevated to 0.59.  He has been under a lot of stress recently with one daughter being diagnosed with cancer and the other in critical condition from bilateral pneumonia. He denies any exertional shortness of breath or chest pain at baseline. He is typically able to do everything he wants without any pain or discomfort. He is a never smoker or drinker. He takes all his medications consistently.  Allergies  Allergen Reactions  . Lisinopril Swelling    Facial and lip swelling. And throat swelling.   . Benadryl [Diphenhydramine Hcl] Swelling    Facial swelling from high dose (tolerates Advil PM as needed)  . Darvocet [Propoxyphene N-Acetaminophen] Other (See Comments)    hallucination  . Darvon [Propoxyphene Hcl] Other (See Comments)    Hallucination   . Tylenol [Acetaminophen] Swelling    Foot swelling   Home Medications No current facility-administered medications on file prior to encounter.    Current Outpatient Medications on  File Prior to Encounter  Medication Sig Dispense Refill  . aspirin EC 81 MG tablet Take 1 tablet (81 mg total) by mouth daily. 90 tablet 3  . azelastine (ASTELIN) 0.1 % nasal spray Place 1 spray into both nostrils 2 (two) times daily. 30 mL 5  . EPINEPHrine 0.3 mg/0.3 mL IJ SOAJ injection Inject 0.3 mLs (0.3 mg total) into the muscle as needed (for allergic reaction). 2 Device 0  . fluticasone (FLONASE) 50 MCG/ACT nasal spray Place 1 spray into both nostrils daily. 16 g 4  . icatibant (FIRAZYR) 30 MG/3ML injection Inject 30 mg into the skin once as needed (edema).    [name] ketoconazole (NIZORAL) 2 % shampoo Apply 1 application topically 2 (two) times daily.    [name] levothyroxine (SYNTHROID, LEVOTHROID) 75 MCG tablet Take 75 mcg by mouth daily before breakfast.    . metoprolol tartrate (LOPRESSOR) 25 MG tablet Take 1 tablet (25 mg total) by mouth 2 (two) times daily. 90 tablet 6  . nabumetone (RELAFEN) 750 MG tablet Take 1 tablet by mouth daily.    . nitroGLYCERIN (NITROSTAT) 0.4 MG SL tablet DISSOLVE 1 TABLET UNDER TONGUE EVERY 5 MINUTES FOR 3 DOSES AS NEEDED CHEST PAIN. IF NO RELIEF CALL 911 (Patient taking differently: Place 0.4 mg under the tongue every 5 (five) minutes as needed for chest pain. ) 75 tablet 2  . pantoprazole (PROTONIX) 40 MG tablet Take 1 tablet (40 mg total) by mouth 2 (two) times daily. 60 tablet 0  . Pitavastatin Calcium (LIVALO) 4 MG TABS Take 4 mg by mouth at bedtime.     [name]  Polyethyl Glycol-Propyl Glycol (SYSTANE OP) Place 1 drop into both eyes 2 (two) times daily.    [name] triamcinolone cream (KENALOG) 0.5 % Apply 1 application topically daily.    [name] UNABLE TO FIND [place] injected into the skin every 2 weeks    . Cetirizine HCl (ZYRTEC ALLERGY) 10 MG CAPS Take 1 capsule (10 mg total) by mouth at bedtime and may repeat dose one time if needed. (Patient not taking: Reported on 05/21/2018) 30 capsule 0  . montelukast (SINGULAIR) 10 MG tablet Take 1 tablet (10 mg total) by mouth at  bedtime. (Patient not taking: Reported on 05/21/2018) 30 tablet 5  . naproxen (NAPROSYN) 375 MG tablet Take 1 tablet (375 mg total) by mouth 2 (two) times daily. (Patient not taking: Reported on 05/21/2018) 20 tablet 0  . polyethylene glycol (MIRALAX) packet Take 17 g by mouth daily. (Patient not taking: Reported on 05/21/2018) 28 each 0   Scheduled Medications . sodium chloride flush  3 mL Intravenous Once    Infusions . sodium chloride 75 mL/hr at 05/21/18 0910  . heparin 1,200 Units/hr (05/21/18 0919)     Past Medical History:  Diagnosis Date  . Angio-edema   . Anxiety   . Arthritis   . CAD (coronary artery disease)    a. 02/2015: DES to mid-LAD  . GERD (gastroesophageal reflux disease)   . Gout   . High cholesterol   . History of prolonged Q-T interval on ECG   . Hx of Clostridium difficile infection   . Hx of umbilical hernia repair   . Personal history of digestive disease    gastric ulcer  . Prostate cancer ([place])   . Prostate enlargement   . Skin cancer    basal cell carcinoma  . Thyroid disease   . Urticaria    Past Surgical History:  Procedure Laterality Date  . APPENDECTOMY    . BACK SURGERY    . CARDIAC CATHETERIZATION N/A 02/21/2015   Procedure: Left Heart Cath;  Surgeon: Lorretta Harp, MD;  Location: [place] [facility];  Service: Cardiovascular;  Laterality: N/A;  . CATARACT EXTRACTION, BILATERAL    . NASAL SINUS SURGERY    . PROSTATE BIOPSY    . right shoulder rotator cuff repair     Family History  Problem Relation Age of Onset  . Cancer Mother   . Cancer Father        prostate  . Cancer Sister    Social History Daniel Herrera reports that he has never smoked. He has never used smokeless tobacco. Daniel Herrera reports no history of alcohol use.  Review of Systems Otherwise reviewed and negative except as outlined.  Physical Examination Temp:  [97.6 F (36.4 C)-97.7 F (36.5 C)] 97.7 F (36.5 C) (02/18 0531) Pulse Rate:  [65-107] 69  (02/18 0929) Resp:  [8-26] 9 (02/18 1000) BP: (124-157)/(71-96) 152/84 (02/18 1000) SpO2:  [97 %-100 %] 98 % (02/18 0929) No intake or output data in the 24 hours ending 05/21/18 1027  Gen: No acute distress. HEENT: Conjunctiva and lids normal, oropharynx clear with moist mucosa. Neck: Supple, no elevated JVP or carotid bruits, no thyromegaly. Lungs: Clear to auscultation, nonlabored breathing at rest. Cardiac: Regular rate and rhythm, no S3 or significant systolic murmur, no pericardial rub. Abdomen: Soft, nontender, no hepatomegaly, bowel sounds present, no guarding or rebound. Extremities: Trace LLE edmea, distal pulses 2+. Skin: Warm and dry. Musculoskeletal: No kyphosis. Neuropsychiatric: Alert and oriented x3, affect grossly appropriate.  Lab Results  Basic Metabolic Panel: Recent Labs  Lab 05/20/18 2240  NA 136  K 3.7  CL 106  CO2 20*  GLUCOSE 240*  BUN 22  CREATININE 1.59*  CALCIUM 9.3   Liver Function Tests: No results for input(s): AST, ALT, ALKPHOS, BILITOT, PROT, ALBUMIN in the last 168 hours.  CBC: Recent Labs  Lab 05/20/18 2240  WBC 5.1  HGB 14.6  HCT 45.5  MCV 92.1  PLT 142*   Cardiac Enzymes: No results for input(s): CKTOTAL, CKMB, CKMBINDEX, TROPONINI in the last 168 hours.  BNP: Invalid input(s): POCBNP  Radiology Dg Chest 2 View  Result Date: 05/20/2018 CLINICAL DATA:  Chest pain EXAM: CHEST - 2 VIEW COMPARISON:  12/21/2017, 03/13/2018 FINDINGS: Diffuse reticular opacity consistent with chronic interstitial disease. No acute consolidation or effusion. Normal cardiomediastinal silhouette. No pneumothorax. Apical pleural and parenchymal scarring. Displaced fracture involving the distal left clavicle. IMPRESSION: No active cardiopulmonary disease. Displaced fracture involving the distal left clavicle, noted on comparison study from 03/13/2018 Electronically Signed   By: Donavan Foil M.D.   On: 05/20/2018 23:13   Prior Cardiac Testing/Procedures:     Left Heart Cath 02/21/2015   Prox RCA to Mid RCA lesion, 40% stenosed.  Prox LAD to Mid LAD lesion, 80% stenosed.  Mid LAD lesion, 99% stenosed. Post intervention, there is a 0% residual stenosis.  Ost LAD to Prox LAD lesion, 95% stenosed. Post intervention, there is a 0% residual stenosis.  There is mild to moderate left ventricular systolic dysfunction.  1st Mrg lesion, 70% stenosed.  Mid Cx lesion, 70% stenosed.  ECG: Normal sinus rhythm with left ventricular hypertrophy. No acute ST elevation, new T-wave immersion, or new conduction abnormalities. Stable compared to prior.  Impression and Recommendations  Daniel Herrera is a 78 y.o.male with known CAD status post drug-eluting stent to the mid and proximal LAD in 2016 who presented to the emergency department with severe substernal chest pressure.  Elevated Troponin  CAD s/p DES to the mid and proximal LAD in 2016 - Initial EKG without ischemic changes - Point-of-care troponin elevated to 0.59 - Started on heparin in the ED  - Continue ASA and metoprolol tartrate 25 mg BID - Not on lisinopril due to angioedema  - Repeat echocardiogram  - Patient reports a history of paroxysmal atrial fibrillation. Prior to the onset of his chest pain he did note palpitations and a heart rate of around 150 bpm. If the cardiac cath is negative he will need outpatient EKG monitoring to assess for arrhythmias.  - Patient is high risk for NSTE-ACS and would warrant left heart cath for diagnostic and therapeutic purposes.   CKD Stage II-III - Creatinine at baseline  - Will need pre- and post cath hydration   Hypertension  - Continue Metoprolol Tartrate 25 mg BID   HLD - Continue pitavastatin  Will discuss case further with Dr. Tamala Julian.   Signed: Ina Homes, MD 05/21/2018, 10:27 AM

## 2018-05-21 NOTE — ED Provider Notes (Signed)
[facility] EMERGENCY DEPARTMENT Provider Note   CSN: [id number] Arrival date & time: 05/20/18  2221    History   Chief Complaint Chief Complaint  Patient presents with  . Chest Pain    HPI Daniel Herrera is a 78 y.o. male.     Patient followed by Dr. Alvester Chou from cardiology.  Patient status post cardiac stent and cardiac catheterization 2016.  Patient with onset of substernal chest pain at 1700 yesterday lasted until about 2300 this evening.  Associated with palpitations and rapid heart rate.  And also associated with some shortness of breath.  Since 2300 patient's been completely asymptomatic.  Patient's initial troponin in the waiting room was normal.  Initial EKG without any acute changes.  Patient is remained asymptomatic since the pain resolved at 2300.     Past Medical History:  Diagnosis Date  . Angio-edema   . Anxiety   . Arthritis   . CAD (coronary artery disease)    a. 02/2015: DES to mid-LAD  . GERD (gastroesophageal reflux disease)   . Gout   . High cholesterol   . History of prolonged Q-T interval on ECG   . Hx of Clostridium difficile infection   . Hx of umbilical hernia repair   . Personal history of digestive disease    gastric ulcer  . Prostate cancer ([place])   . Prostate enlargement   . Skin cancer    basal cell carcinoma  . Thyroid disease   . Urticaria     Patient Active Problem List   Diagnosis Date Noted  . Pre-operative clearance 01/02/2018  . Hematochezia 12/21/2017  . CKD (chronic kidney disease) stage 3, GFR 30-59 ml/min (HCC) 12/21/2017  . GI bleed 12/21/2017  . Hereditary angioedema type 2 ([place]) 07/28/2017  . Enterotoxigenic Escherichia coli infection 05/24/2015  . STEC (Shiga toxin-producing Escherichia coli) infection 05/24/2015  . Colitis 05/23/2015  . History of prostate cancer   . Chronic diarrhea of unknown origin   . CAD S/P percutaneous coronary angioplasty   . Type 2 diabetes mellitus with unspecified  complications ([place])   . Chest pain 04/08/2015  . Dyspnea 04/08/2015  . Palpitations 03/15/2015  . History of non-ST elevation myocardial infarction (NSTEMI) 02/22/2015  . Abnormal EKG 02/21/2015  . Essential hypertension 02/21/2015  . Dyslipidemia, goal LDL below 70 02/21/2015  . Malignant neoplasm of prostate ([place]) 08/07/2014  . Acute interstitial pneumonitis ([place]) 12/17/2013    Past Surgical History:  Procedure Laterality Date  . APPENDECTOMY    . BACK SURGERY    . CARDIAC CATHETERIZATION N/A 02/21/2015   Procedure: Left Heart Cath;  Surgeon: Lorretta Harp, MD;  Location: [place] [facility];  Service: Cardiovascular;  Laterality: N/A;  . CATARACT EXTRACTION, BILATERAL    . NASAL SINUS SURGERY    . PROSTATE BIOPSY    . right shoulder rotator cuff repair          Home Medications    Prior to Admission medications   Medication Sig Start Date End Date Taking? Authorizing Provider  aspirin EC 81 MG tablet Take 1 tablet (81 mg total) by mouth daily. 01/02/18  Yes Kilroy, Luke K, PA-C  azelastine (ASTELIN) 0.1 % nasal spray Place 1 spray into both nostrils 2 (two) times daily. 12/12/17  Yes Kozlow, Donnamarie Poag, MD  EPINEPHrine 0.3 mg/0.3 mL IJ SOAJ injection Inject 0.3 mLs (0.3 mg total) into the muscle as needed (for allergic reaction). 05/20/18  Yes Kozlow, Donnamarie Poag, MD  fluticasone (FLONASE) 50 MCG/ACT nasal spray Place 1 spray into both nostrils daily. 12/12/17  Yes Kozlow, Donnamarie Poag, MD  icatibant [facility]) 30 MG/3ML injection Inject 30 mg into the skin once as needed (edema).   Yes [provider]  ketoconazole (NIZORAL) 2 % shampoo Apply 1 application topically 2 (two) times daily. 05/14/18  Yes [provider]  levothyroxine (SYNTHROID, LEVOTHROID) 75 MCG tablet Take 75 mcg by mouth daily before breakfast.   Yes [provider]  metoprolol tartrate (LOPRESSOR) 25 MG tablet Take 1 tablet (25 mg total) by mouth 2 (two) times daily. 05/25/15  Yes Short, Noah Delaine,  MD  nabumetone (RELAFEN) 750 MG tablet Take 1 tablet by mouth daily. 05/08/18  Yes [provider]  nitroGLYCERIN (NITROSTAT) 0.4 MG SL tablet DISSOLVE 1 TABLET UNDER TONGUE EVERY 5 MINUTES FOR 3 DOSES AS NEEDED CHEST PAIN. IF NO RELIEF CALL 911 Patient taking differently: Place 0.4 mg under the tongue every 5 (five) minutes as needed for chest pain.  03/03/15  Yes Simmons, Brittainy M, PA-C  pantoprazole (PROTONIX) 40 MG tablet Take 1 tablet (40 mg total) by mouth 2 (two) times daily. 12/23/17  Yes Thurnell Lose, MD  Pitavastatin Calcium (LIVALO) 4 MG TABS Take 4 mg by mouth at bedtime.    Yes [provider]  Polyethyl Glycol-Propyl Glycol (SYSTANE OP) Place 1 drop into both eyes 2 (two) times daily.   Yes [provider]  triamcinolone cream (KENALOG) 0.5 % Apply 1 application topically daily. 04/06/18  Yes [provider]  UNABLE TO [place] injected into the skin every 2 weeks   Yes [provider]  Cetirizine HCl (ZYRTEC ALLERGY) 10 MG CAPS Take 1 capsule (10 mg total) by mouth at bedtime and may repeat dose one time if needed. Patient not taking: Reported on 05/21/2018 12/23/17   Thurnell Lose, MD  montelukast (SINGULAIR) 10 MG tablet Take 1 tablet (10 mg total) by mouth at bedtime. Patient not taking: Reported on 05/21/2018 09/12/17   Jiles Prows, MD  naproxen (NAPROSYN) 375 MG tablet Take 1 tablet (375 mg total) by mouth 2 (two) times daily. Patient not taking: Reported on 05/21/2018 02/09/18   Frederica Kuster, PA-C  polyethylene glycol [facility]) packet Take 17 g by mouth daily. Patient not taking: Reported on 05/21/2018 12/23/17   Thurnell Lose, MD    Family History Family History  Problem Relation Age of Onset  . Cancer Mother   . Cancer Father        prostate  . Cancer Sister     Social History Social History   Tobacco Use  . Smoking status: Never Smoker  . Smokeless tobacco: Never Used  Substance Use Topics  .  Alcohol use: No  . Drug use: No     Allergies   Lisinopril; Benadryl [diphenhydramine hcl]; Darvocet [propoxyphene n-acetaminophen]; Darvon [propoxyphene hcl]; and Tylenol [acetaminophen]   Review of Systems Review of Systems  Constitutional: Negative for chills and fever.  HENT: Negative for rhinorrhea and sore throat.   Eyes: Negative for visual disturbance.  Respiratory: Positive for shortness of breath. Negative for cough.   Cardiovascular: Positive for chest pain and palpitations. Negative for leg swelling.  Gastrointestinal: Negative for abdominal pain, diarrhea, nausea and vomiting.  Genitourinary: Negative for dysuria.  Musculoskeletal: Negative for back pain and neck pain.  Skin: Negative for rash.  Neurological: Negative for dizziness, light-headedness and headaches.  Hematological: Does not bruise/bleed easily.  Psychiatric/Behavioral: Negative for confusion.  Physical Exam Updated Vital Signs BP (!) 143/84 (BP Location: Right Arm)   Pulse 65   Temp 97.7 F (36.5 C) (Oral)   Resp 18   SpO2 98%   Physical Exam Vitals signs and nursing note reviewed.  Constitutional:      Appearance: Normal appearance. He is well-developed.  HENT:     Head: Normocephalic and atraumatic.     Mouth/Throat:     Mouth: Mucous membranes are moist.  Eyes:     Extraocular Movements: Extraocular movements intact.     Conjunctiva/sclera: Conjunctivae normal.     Pupils: Pupils are equal, round, and reactive to light.  Neck:     Musculoskeletal: Normal range of motion and neck supple.  Cardiovascular:     Rate and Rhythm: Normal rate and regular rhythm.     Heart sounds: Normal heart sounds. No murmur.  Pulmonary:     Effort: Pulmonary effort is normal. No respiratory distress.     Breath sounds: Normal breath sounds.  Abdominal:     General: Bowel sounds are normal.     Palpations: Abdomen is soft.     Tenderness: There is no abdominal tenderness.  Musculoskeletal: Normal  range of motion.        General: No swelling.  Skin:    General: Skin is warm and dry.  Neurological:     General: No focal deficit present.     Mental Status: He is alert and oriented to person, place, and time.      ED Treatments / Results  Labs (all labs ordered are listed, but only abnormal results are displayed) Labs Reviewed  BASIC METABOLIC PANEL - Abnormal; Notable for the following components:      Result Value   CO2 20 (*)    Glucose, Bld 240 (*)    Creatinine, Ser 1.59 (*)    GFR calc non Af Amer 41 (*)    GFR calc Af Amer 47 (*)    All other components within normal limits  CBC - Abnormal; Notable for the following components:   Platelets 142 (*)    All other components within normal limits  I-STAT TROPONIN, ED - Abnormal; Notable for the following components:   Troponin i, poc 0.59 (*)    All other components within normal limits  I-STAT TROPONIN, ED    EKG EKG Interpretation  Date/Time:  Monday May 20 2018 22:26:15 EST Ventricular Rate:  108 PR Interval:  214 QRS Duration: 94 QT Interval:  328 QTC Calculation: 439 R Axis:   -22 Text Interpretation:  Sinus tachycardia with 1st degree A-V block Incomplete right bundle branch block Left ventricular hypertrophy Abnormal ECG Confirmed by Fredia Sorrow [phone]) on 05/21/2018 7:20:37 AM   Radiology Dg Chest 2 View  Result Date: 05/20/2018 CLINICAL DATA:  Chest pain EXAM: CHEST - 2 VIEW COMPARISON:  12/21/2017, 03/13/2018 FINDINGS: Diffuse reticular opacity consistent with chronic interstitial disease. No acute consolidation or effusion. Normal cardiomediastinal silhouette. No pneumothorax. Apical pleural and parenchymal scarring. Displaced fracture involving the distal left clavicle. IMPRESSION: No active cardiopulmonary disease. Displaced fracture involving the distal left clavicle, noted on comparison study from 03/13/2018 Electronically Signed   By: Donavan Foil M.D.   On: 05/20/2018 23:13     Procedures Procedures (including critical care time)  CRITICAL CARE Performed by: Fredia Sorrow Total critical care time: 30 minutes Critical care time was exclusive of separately billable procedures and treating other patients. Critical care was necessary to treat or prevent imminent or  life-threatening deterioration. Critical care was time spent personally by me on the following activities: development of treatment plan with patient and/or surrogate as well as nursing, discussions with consultants, evaluation of patient's response to treatment, examination of patient, obtaining history from patient or surrogate, ordering and performing treatments and interventions, ordering and review of laboratory studies, ordering and review of radiographic studies, pulse oximetry and re-evaluation of patient's condition.   Medications Ordered in ED Medications  sodium chloride flush (NS) 0.9 % injection 3 mL (has no administration in time range)  aspirin chewable tablet 324 mg (has no administration in time range)  heparin injection 4,000 Units (has no administration in time range)  heparin ADULT infusion 100 units/mL (25000 units/237mL sodium chloride 0.45%) (has no administration in time range)  0.9 %  sodium chloride infusion (has no administration in time range)     Initial Impression / Assessment and Plan / ED Course  I have reviewed the triage vital signs and the nursing notes.  Pertinent labs & imaging results that were available during my care of the patient were reviewed by me and considered in my medical decision making (see chart for details).        Patient with known history of coronary disease.  Status post stent and cardiac catheterization in 2016.  Patient with onset of rapid heart rate palpitations and chest pain at around 1700 yesterday.  It stopped around 2300.  Is been pain-free since no palpitations since.  Initial troponin when he first got here at around 2200 was  normal.  Repeat here this morning is elevated at 0.5.  Patient still asymptomatic.  Will start on heparin.  We will give aspirin.  And will talk to cardiology.   Final Clinical Impressions(s) / ED Diagnoses   Final diagnoses:  NSTEMI (non-ST elevated myocardial infarction) [facility])    ED Discharge Orders    None       Fredia Sorrow, MD 05/21/18 [phone]

## 2018-05-21 NOTE — ED Notes (Signed)
Cardiac PA into speak to pt

## 2018-05-21 NOTE — Progress Notes (Signed)
ANTICOAGULATION CONSULT NOTE - Initial Consult  Pharmacy Consult for heparin Indication: chest pain/ACS  Allergies  Allergen Reactions  . Lisinopril Swelling    Facial and lip swelling. And throat swelling.   . Benadryl [Diphenhydramine Hcl] Swelling    Facial swelling from high dose (tolerates Advil PM as needed)  . Darvocet [Propoxyphene N-Acetaminophen] Other (See Comments)    hallucination  . Darvon [Propoxyphene Hcl] Other (See Comments)    Hallucination   . Tylenol [Acetaminophen] Swelling    Foot swelling    Patient Measurements:   Heparin Dosing Weight: 94.7 kg  Vital Signs: Temp: 97.7 F (36.5 C) (02/18 0531) Temp Source: Oral (02/18 0531) BP: 143/84 (02/18 0531) Pulse Rate: 65 (02/18 0531)  Labs: Recent Labs    05/20/18 2240  HGB 14.6  HCT 45.5  PLT 142*  CREATININE 1.59*    Estimated Creatinine Clearance: 42.6 mL/min (A) (by C-G formula based on SCr of 1.59 mg/dL (H)).   Medical History: Past Medical History:  Diagnosis Date  . Angio-edema   . Anxiety   . Arthritis   . CAD (coronary artery disease)    a. 02/2015: DES to mid-LAD  . GERD (gastroesophageal reflux disease)   . Gout   . High cholesterol   . History of prolonged Q-T interval on ECG   . Hx of Clostridium difficile infection   . Hx of umbilical hernia repair   . Personal history of digestive disease    gastric ulcer  . Prostate cancer ([place])   . Prostate enlargement   . Skin cancer    basal cell carcinoma  . Thyroid disease   . Urticaria     Assessment: 78 yo M presents with CP. Pharmacy consulted for heparin. Hgb good, plts borderline low at 142.  Goal of Therapy:  Heparin level 0.3-0.7 units/ml Monitor platelets by anticoagulation protocol: Yes   Plan:  Give heparin 4,000 unit bolus Start heparin gtt at 1,200 units/hr Monitor daily heparin level, CBC, s/s of bleed   Donis Kotowski J 05/21/2018,8:33 AM

## 2018-05-21 NOTE — Progress Notes (Signed)
[place] for heparin Indication: chest pain/ACS  Allergies  Allergen Reactions  . Lisinopril Swelling    Facial and lip swelling. And throat swelling.   . Benadryl [Diphenhydramine Hcl] Swelling    Facial swelling from high dose (tolerates Advil PM as needed)  . Darvocet [Propoxyphene N-Acetaminophen] Other (See Comments)    hallucination  . Darvon [Propoxyphene Hcl] Other (See Comments)    Hallucination   . Tylenol [Acetaminophen] Swelling    Foot swelling    Patient Measurements: Height: 6\' 1"  (185.4 cm) Weight: 203 lb 8 oz (92.3 kg) IBW/kg (Calculated) : 79.9 Heparin Dosing Weight: 94.7 kg  Vital Signs: Temp: 97.8 F (36.6 C) (02/18 1900) Temp Source: Oral (02/18 1900) BP: 163/97 (02/18 1900) Pulse Rate: 65 (02/18 1730)  Labs: Recent Labs    05/20/18 2240 05/21/18 1058 05/21/18 1643 05/21/18 1700  HGB 14.6  --   --   --   HCT 45.5  --   --   --   PLT 142*  --   --   --   HEPARINUNFRC  --   --   --  0.48  CREATININE 1.59*  --   --   --   TROPONINI  --  0.59* 0.38*  --     Estimated Creatinine Clearance: 42.6 mL/min (A) (by C-G formula based on SCr of 1.59 mg/dL (H)).   Medical History: Past Medical History:  Diagnosis Date  . Angio-edema   . Anxiety   . Arthritis   . CAD (coronary artery disease)    a. 02/2015: DES to mid-LAD  . GERD (gastroesophageal reflux disease)   . Gout   . High cholesterol   . History of prolonged Q-T interval on ECG   . Hx of Clostridium difficile infection   . Hx of umbilical hernia repair   . Personal history of digestive disease    gastric ulcer  . Prostate cancer ([place])   . Prostate enlargement   . Skin cancer    basal cell carcinoma  . Thyroid disease   . Urticaria     Assessment: 78 yo M presents with CP. Pharmacy consulted for heparin. Hgb good, plts borderline low at 142.  Initial heparin level at goal this evening. No bleeding issues noted. Recheck level with am labs.    Goal of Therapy:  Heparin level 0.3-0.7 units/ml Monitor platelets by anticoagulation protocol: Yes   Plan:  Continue heparin gtt at 1,200 units/hr Monitor daily heparin level, CBC, s/s of bleed  Erin Hearing PharmD., BCPS Clinical Pharmacist 05/21/2018 7:54 PM

## 2018-05-21 NOTE — ED Notes (Signed)
Karen(SR)Lunch Tray Ordered @ 1120-per Cindy, RN-called by Levada Dy

## 2018-05-21 NOTE — ED Notes (Signed)
Attempted report 

## 2018-05-21 NOTE — ED Notes (Signed)
Pt is npo 

## 2018-05-22 ENCOUNTER — Other Ambulatory Visit: Payer: Self-pay | Admitting: *Deleted

## 2018-05-22 ENCOUNTER — Encounter (HOSPITAL_COMMUNITY): Payer: Self-pay | Admitting: Interventional Cardiology

## 2018-05-22 ENCOUNTER — Inpatient Hospital Stay (HOSPITAL_COMMUNITY): Payer: Medicare Other

## 2018-05-22 ENCOUNTER — Encounter (HOSPITAL_COMMUNITY)
Admission: EM | Disposition: A | Discharge status: Home Health | Payer: Self-pay | Source: Home / Self Care | Attending: Interventional Cardiology

## 2018-05-22 ENCOUNTER — Other Ambulatory Visit (HOSPITAL_COMMUNITY): Payer: Medicare Other

## 2018-05-22 DIAGNOSIS — E785 Hyperlipidemia, unspecified: Secondary | ICD-10-CM

## 2018-05-22 DIAGNOSIS — E1159 Type 2 diabetes mellitus with other circulatory complications: Secondary | ICD-10-CM

## 2018-05-22 DIAGNOSIS — I25110 Atherosclerotic heart disease of native coronary artery with unstable angina pectoris: Secondary | ICD-10-CM

## 2018-05-22 DIAGNOSIS — I2510 Atherosclerotic heart disease of native coronary artery without angina pectoris: Secondary | ICD-10-CM

## 2018-05-22 DIAGNOSIS — R079 Chest pain, unspecified: Secondary | ICD-10-CM

## 2018-05-22 DIAGNOSIS — Z01810 Encounter for preprocedural cardiovascular examination: Secondary | ICD-10-CM

## 2018-05-22 HISTORY — PX: LEFT HEART CATH AND CORONARY ANGIOGRAPHY: CATH118249

## 2018-05-22 LAB — ECHOCARDIOGRAM COMPLETE
HEIGHTINCHES: 73 in
Weight: 3259.2 oz

## 2018-05-22 LAB — CBC
HCT: 39.5 % (ref 39.0–52.0)
Hemoglobin: 13 g/dL (ref 13.0–17.0)
MCH: 29.9 pg (ref 26.0–34.0)
MCHC: 32.9 g/dL (ref 30.0–36.0)
MCV: 90.8 fL (ref 80.0–100.0)
NRBC: 0 % (ref 0.0–0.2)
Platelets: 143 10*3/uL — ABNORMAL LOW (ref 150–400)
RBC: 4.35 MIL/uL (ref 4.22–5.81)
RDW: 12.8 % (ref 11.5–15.5)
WBC: 3.8 10*3/uL — ABNORMAL LOW (ref 4.0–10.5)

## 2018-05-22 LAB — BASIC METABOLIC PANEL
ANION GAP: 6 (ref 5–15)
BUN: 19 mg/dL (ref 8–23)
CO2: 26 mmol/L (ref 22–32)
Calcium: 8.8 mg/dL — ABNORMAL LOW (ref 8.9–10.3)
Chloride: 109 mmol/L (ref 98–111)
Creatinine, Ser: 1.38 mg/dL — ABNORMAL HIGH (ref 0.61–1.24)
GFR calc Af Amer: 56 mL/min — ABNORMAL LOW (ref >60)
GFR calc non Af Amer: 48 mL/min — ABNORMAL LOW (ref >60)
Glucose, Bld: 139 mg/dL — ABNORMAL HIGH (ref 70–99)
Potassium: 4 mmol/L (ref 3.5–5.1)
Sodium: 141 mmol/L (ref 135–145)

## 2018-05-22 LAB — TROPONIN I: Troponin I: 0.25 ng/mL (ref <0.03)

## 2018-05-22 LAB — HEPARIN LEVEL (UNFRACTIONATED): Heparin Unfractionated: 0.38 IU/mL (ref 0.30–0.70)

## 2018-05-22 SURGERY — LEFT HEART CATH AND CORONARY ANGIOGRAPHY
Anesthesia: LOCAL

## 2018-05-22 MED ORDER — HEPARIN SODIUM (PORCINE) 1000 UNIT/ML IJ SOLN
INTRAMUSCULAR | Status: AC
  Filled 2018-05-22: qty 1

## 2018-05-22 MED ORDER — HEPARIN (PORCINE) IN NACL 1000-0.9 UT/500ML-% IV SOLN
INTRAVENOUS | Status: AC
  Filled 2018-05-22: qty 1000

## 2018-05-22 MED ORDER — FENTANYL CITRATE (PF) 100 MCG/2ML IJ SOLN
INTRAMUSCULAR | Status: DC | PRN
  Administered 2018-05-22: 25 ug via INTRAVENOUS

## 2018-05-22 MED ORDER — MIDAZOLAM HCL 2 MG/2ML IJ SOLN
INTRAMUSCULAR | Status: DC | PRN
  Administered 2018-05-22: 2 mg via INTRAVENOUS

## 2018-05-22 MED ORDER — HEPARIN SODIUM (PORCINE) 1000 UNIT/ML IJ SOLN
INTRAMUSCULAR | Status: DC | PRN
  Administered 2018-05-22: 3500 [IU] via INTRAVENOUS

## 2018-05-22 MED ORDER — HEPARIN (PORCINE) IN NACL 1000-0.9 UT/500ML-% IV SOLN
INTRAVENOUS | Status: DC | PRN
  Administered 2018-05-22 (×2): 500 mL

## 2018-05-22 MED ORDER — VERAPAMIL HCL 2.5 MG/ML IV SOLN
INTRAVENOUS | Status: AC
  Filled 2018-05-22: qty 2

## 2018-05-22 MED ORDER — IOHEXOL 350 MG/ML SOLN
INTRAVENOUS | Status: DC | PRN
  Administered 2018-05-22: 45 mL via INTRA_ARTERIAL

## 2018-05-22 MED ORDER — LIDOCAINE HCL (PF) 1 % IJ SOLN
INTRAMUSCULAR | Status: DC | PRN
  Administered 2018-05-22: 2 mL

## 2018-05-22 MED ORDER — ONDANSETRON HCL 4 MG/2ML IJ SOLN: 4.0000 mg | Freq: Four times a day (QID) | INTRAMUSCULAR | Status: DC | PRN

## 2018-05-22 MED ORDER — ACETAMINOPHEN 325 MG PO TABS: 650.0000 mg | ORAL_TABLET | ORAL | Status: DC | PRN

## 2018-05-22 MED ORDER — SODIUM CHLORIDE 0.9 % IV SOLN: 250.0000 mL | INTRAVENOUS | Status: DC | PRN

## 2018-05-22 MED ORDER — SODIUM CHLORIDE 0.9 % IV SOLN: INTRAVENOUS | Status: AC

## 2018-05-22 MED ORDER — SODIUM CHLORIDE 0.9% FLUSH: 3.0000 mL | INTRAVENOUS | Status: DC | PRN

## 2018-05-22 MED ORDER — LIDOCAINE HCL (PF) 1 % IJ SOLN
INTRAMUSCULAR | Status: AC
  Filled 2018-05-22: qty 30

## 2018-05-22 MED ORDER — VERAPAMIL HCL 2.5 MG/ML IV SOLN
INTRAVENOUS | Status: DC | PRN
  Administered 2018-05-22: 10 mL via INTRA_ARTERIAL

## 2018-05-22 MED ORDER — MIDAZOLAM HCL 2 MG/2ML IJ SOLN
INTRAMUSCULAR | Status: AC
  Filled 2018-05-22: qty 2

## 2018-05-22 MED ORDER — FENTANYL CITRATE (PF) 100 MCG/2ML IJ SOLN
INTRAMUSCULAR | Status: AC
  Filled 2018-05-22: qty 2

## 2018-05-22 MED ORDER — SODIUM CHLORIDE 0.9% FLUSH
3.0000 mL | Freq: Two times a day (BID) | INTRAVENOUS | Status: DC
  Administered 2018-05-23 (×2): 3 mL via INTRAVENOUS

## 2018-05-22 MED ORDER — HEPARIN (PORCINE) 25000 UT/250ML-% IV SOLN
1400.0000 [IU]/h | INTRAVENOUS | Status: DC
  Administered 2018-05-22: 1200 [IU]/h via INTRAVENOUS
  Administered 2018-05-24: 1400 [IU]/h via INTRAVENOUS
  Filled 2018-05-22 (×3): qty 250

## 2018-05-22 SURGICAL SUPPLY — 9 items
CATH 5FR JL3.5 JR4 ANG PIG MP (CATHETERS) ×2 IMPLANT
DEVICE RAD COMP TR BAND LRG (VASCULAR PRODUCTS) ×2 IMPLANT
GLIDESHEATH SLEND SS 6F .021 (SHEATH) ×2 IMPLANT
GUIDEWIRE INQWIRE 1.5J.035X260 (WIRE) ×1 IMPLANT
INQWIRE 1.5J .035X260CM (WIRE) ×2
KIT HEART LEFT (KITS) ×2 IMPLANT
PACK CARDIAC CATHETERIZATION (CUSTOM PROCEDURE TRAY) ×2 IMPLANT
TRANSDUCER W/STOPCOCK (MISCELLANEOUS) ×2 IMPLANT
TUBING CIL FLEX 10 FLL-RA (TUBING) ×2 IMPLANT

## 2018-05-22 NOTE — Consult Note (Signed)
[name] Medical Record #[id number] Date of Birth: 15-Apr-1940  Referring: No ref. provider found Primary Care: [name], NP Primary Cardiologist:Jonathan Gwenlyn Found, MD  Chief Complaint:    Chief Complaint  Patient presents with  . Chest Pain    History of Present Illness:   Patient is a 78 year old male with a known history of coronary artery disease status post drug-eluting stent to the mid and proximal LAD in 2016 who presented to the emergency department with severe substernal chest pressure.  On 2/17 at approximately 5 PM he began to experience substernal chest pain associated with palpitations.  He took his blood pressure and heart rate and they were both elevated.  Did not know the exact numbers.  He is brought to the emergency room for further evaluation.  The pain persisted for several hours and spontaneously resolved.  Troponins were noted to be elevated and peaked at 0.59.  It is currently trending down. He was admitted for further evaluation treatment to include cardiology consultation.  It was felt he should undergo cardiac catheterization which was done on today's date and findings are as listed below.  He has severe three-vessel coronary artery disease and we are asked to see in cardiothoracic surgical consultation for consideration of CABG.  Echocardiogram has been performed and is listed below.  Section fraction is 60 to 65%.He has a history of hypercholesterolemia and hypertension.  He says that his blood pressure is poorly controlled..  Does not peer to be on any statins.  He has never smoked cigarettes.He is under significant amount of recent family stress with a daughter who was diagnosed with cancer and another who is in critical condition from bilateral pneumonia.  His baseline activity tolerance is good with imtermit shortness  of breath but no chest pain.      Current Activity/ Functional Status: Patient is independent with mobility/ambulation, transfers, ADL's, IADL's.   Zubrod Score: At the time of surgery this patient's most appropriate activity status/level should be described as: []     0    Normal activity, no symptoms [x]     1    Restricted in physical strenuous activity but ambulatory, able to do out light work []     2    Ambulatory and capable of self care, unable to do work activities, up and about                 more than 50%  Of the time                            []     3    Only limited self care, in bed greater than 50% of waking hours []     4    Completely disabled, no self care, confined to bed or chair []     5    Moribund  Past Medical History:  Diagnosis Date  . Angio-edema   . Anxiety   . Arthritis   . CAD (coronary artery disease)    a. 02/2015: DES to mid-LAD  . GERD (gastroesophageal reflux disease)   . Gout   . High cholesterol   . History of prolonged Q-T interval on ECG   . Hx of Clostridium difficile  infection   . Hx of umbilical hernia repair   . Personal history of digestive disease    gastric ulcer  . Prostate cancer ([place])   . Prostate enlargement   . Skin cancer    basal cell carcinoma  . Thyroid disease   . Urticaria     Past Surgical History:  Procedure Laterality Date  . APPENDECTOMY    . BACK SURGERY    . CARDIAC CATHETERIZATION N/A 02/21/2015   Procedure: Left Heart Cath;  Surgeon: Lorretta Harp, MD;  Location: [facility];  Service: Cardiovascular;  Laterality: N/A;  . CATARACT EXTRACTION, BILATERAL    . NASAL SINUS SURGERY    . PROSTATE BIOPSY    . right shoulder rotator cuff repair      Social History   Tobacco Use  Smoking Status Never Smoker  Smokeless Tobacco Never Used    Social History   Substance and Sexual Activity  Alcohol Use No     Allergies  Allergen Reactions  . Lisinopril Swelling    Facial and lip swelling. And throat swelling.   . Benadryl  [Diphenhydramine Hcl] Swelling    Facial swelling from high dose (tolerates Advil PM as needed)  . Darvocet [Propoxyphene N-Acetaminophen] Other (See Comments)    hallucination  . Darvon [Propoxyphene Hcl] Other (See Comments)    Hallucination   . Tylenol [Acetaminophen] Swelling    Foot swelling    Current Facility-Administered Medications  Medication Dose Route Frequency Provider Last Rate Last Dose  . 0.9 %  sodium chloride infusion   Intravenous Continuous Jettie Booze, MD 94.7 mL/hr at 05/22/18 0600    . 0.9 %  sodium chloride infusion   Intravenous Continuous Jettie Booze, MD 100 mL/hr at 05/22/18 1030    . 0.9 %  sodium chloride infusion  250 mL Intravenous PRN Jettie Booze, MD      . acetaminophen (TYLENOL) tablet 650 mg  650 mg Oral Q4H PRN Jettie Booze, MD      . aspirin EC tablet 81 mg  81 mg Oral Daily Jettie Booze, MD   81 mg at 05/22/18 1046  . azelastine (ASTELIN) 0.1 % nasal spray 1 spray  1 spray Each Nare BID Jettie Booze, MD   1 spray at 05/22/18 1048  . fluticasone (FLONASE) 50 MCG/ACT nasal spray 1 spray  1 spray Each Nare Daily Jettie Booze, MD   1 spray at 05/22/18 1048  . heparin ADULT infusion 100 units/mL (25000 units/272mL sodium chloride 0.45%)  1,200 Units/hr Intravenous Continuous Kris Mouton, RPH      . levothyroxine (SYNTHROID, LEVOTHROID) tablet 75 mcg  75 mcg Oral QAC breakfast Jettie Booze, MD   75 mcg at 05/22/18 0540  . metoprolol tartrate (LOPRESSOR) tablet 25 mg  25 mg Oral BID Jettie Booze, MD   25 mg at 05/22/18 1046  . nitroGLYCERIN (NITROSTAT) SL tablet 0.4 mg  0.4 mg Sublingual Q5 min PRN Jettie Booze, MD      . ondansetron [facility]) injection 4 mg  4 mg Intravenous Q6H PRN Jettie Booze, MD      . ondansetron [facility]) tablet 4 mg  4 mg Oral Q6H PRN Jettie Booze, MD      . pantoprazole (PROTONIX) EC tablet 40 mg  40 mg Oral BID Jettie Booze,  MD   40 mg at 05/22/18 1046  . pravastatin (PRAVACHOL) tablet 80 mg  80 mg Oral  [place], MD   80 mg at 05/21/18 1902  . sodium chloride flush (NS) 0.9 % injection 3 mL  3 mL Intravenous Once Larae Grooms S, MD      . sodium chloride flush (NS) 0.9 % injection 3 mL  3 mL Intravenous Q12H Larae Grooms S, MD      . sodium chloride flush (NS) 0.9 % injection 3 mL  3 mL Intravenous PRN Jettie Booze, MD        Medications Prior to Admission  Medication Sig Dispense Refill Last Dose  . aspirin EC 81 MG tablet Take 1 tablet (81 mg total) by mouth daily. 90 tablet 3 05/20/2018 at 0700  . azelastine (ASTELIN) 0.1 % nasal spray Place 1 spray into both nostrils 2 (two) times daily. 30 mL 5 05/20/2018 at Unknown time  . EPINEPHrine 0.3 mg/0.3 mL IJ SOAJ injection Inject 0.3 mLs (0.3 mg total) into the muscle as needed (for allergic reaction). 2 Device 0 unk  . fluticasone (FLONASE) 50 MCG/ACT nasal spray Place 1 spray into both nostrils daily. 16 g 4 05/20/2018 at Unknown time  . icatibant (FIRAZYR) 30 MG/3ML injection Inject 30 mg into the skin once as needed (edema).   Past Month at Unknown time  . ketoconazole (NIZORAL) 2 % shampoo Apply 1 application topically 2 (two) times daily.   05/20/2018 at Unknown time  . levothyroxine (SYNTHROID, LEVOTHROID) 75 MCG tablet Take 75 mcg by mouth daily before breakfast.   05/20/2018 at Unknown time  . metoprolol tartrate (LOPRESSOR) 25 MG tablet Take 1 tablet (25 mg total) by mouth 2 (two) times daily. 90 tablet 6 05/20/2018 at 1900  . nabumetone (RELAFEN) 750 MG tablet Take 1 tablet by mouth daily.   05/20/2018 at Unknown time  . nitroGLYCERIN (NITROSTAT) 0.4 MG SL tablet DISSOLVE 1 TABLET UNDER TONGUE EVERY 5 MINUTES FOR 3 DOSES AS NEEDED CHEST PAIN. IF NO RELIEF CALL 911 (Patient taking differently: Place 0.4 mg under the tongue every 5 (five) minutes as needed for chest pain. ) 75 tablet 2 unk  . pantoprazole (PROTONIX) 40 MG tablet  Take 1 tablet (40 mg total) by mouth 2 (two) times daily. 60 tablet 0 05/20/2018 at Unknown time  . Pitavastatin Calcium (LIVALO) 4 MG TABS Take 4 mg by mouth at bedtime.    05/20/2018 at Unknown time  . Polyethyl Glycol-Propyl Glycol (SYSTANE OP) Place 1 drop into both eyes 2 (two) times daily.   05/20/2018 at Unknown time  . triamcinolone cream (KENALOG) 0.5 % Apply 1 application topically daily.   05/20/2018 at Unknown time  . UNABLE TO FIND Takhzryo injected into the skin every 2 weeks   Past Month at Unknown time  . Cetirizine HCl (ZYRTEC ALLERGY) 10 MG CAPS Take 1 capsule (10 mg total) by mouth at bedtime and may repeat dose one time if needed. (Patient not taking: Reported on 05/21/2018) 30 capsule 0 Not Taking at Unknown time  . montelukast (SINGULAIR) 10 MG tablet Take 1 tablet (10 mg total) by mouth at bedtime. (Patient not taking: Reported on 05/21/2018) 30 tablet 5 Not Taking at Unknown time  . naproxen (NAPROSYN) 375 MG tablet Take 1 tablet (375 mg total) by mouth 2 (two) times daily. (Patient not taking: Reported on 05/21/2018) 20 tablet 0 Not Taking at Unknown time  . polyethylene glycol (MIRALAX) packet Take 17 g by mouth daily. (Patient not taking: Reported on 05/21/2018) 28 each 0 Not Taking at Unknown time  Family History  Problem Relation Age of Onset  . Cancer Mother   . Cancer Father        prostate  . Cancer Sister      Review of Systems:   Review of Systems  Constitutional: Positive for malaise/fatigue. Negative for chills, diaphoresis, fever and weight loss.  HENT: Positive for congestion, hearing loss, nosebleeds, sinus pain and tinnitus. Negative for ear discharge, ear pain and sore throat.   Eyes: Negative for blurred vision, double vision, photophobia, pain, discharge and redness.       Cataract implant  Respiratory: Positive for sputum production and shortness of breath. Negative for cough, hemoptysis, wheezing and stridor.   Cardiovascular: Positive for chest  pain, palpitations, claudication and leg swelling. Negative for orthopnea and PND.  Gastrointestinal: Positive for blood in stool and diarrhea. Negative for abdominal pain, constipation, heartburn, melena, nausea and vomiting.       GI bleed in 2019- source not identified at that time  Genitourinary: Positive for flank pain and frequency. Negative for dysuria, hematuria and urgency.       "hundreds" of kidney stones  Musculoskeletal: Positive for back pain, falls, joint pain and neck pain. Negative for myalgias.       Broke collr bone last November after a fall  Skin: Positive for itching. Negative for rash.       + basal cell skin cancer  Neurological: Negative for dizziness, tingling, tremors, sensory change, speech change, focal weakness, seizures, loss of consciousness, weakness and headaches.  Endo/Heme/Allergies: Positive for environmental allergies.  Psychiatric/Behavioral: Negative.         Physical Exam: BP (!) 143/84 (BP Location: Left Arm)   Pulse 69   Temp (!) 97.5 F (36.4 C) (Axillary)   Resp 20   Ht 6\' 1"  (1.854 m)   Wt 92.4 kg   SpO2 98%   BMI 26.87 kg/m    Physical Exam  Constitutional: No distress.  HENT:  Nose: No nasal discharge.  Mouth/Throat: Dentition is normal. No dental caries. Oropharynx is clear. Pharynx is normal.  Eyes: Pupils are equal, round, and reactive to light. Conjunctivae are normal.  Neck: Normal range of motion and thyroid normal. Neck supple. No JVD present. No neck adenopathy. No thyromegaly present.  Cardiovascular: Regular rhythm, S1 normal, S2 normal and normal heart sounds. Exam reveals decreased pulses. Exam reveals no gallop.  No murmur heard. Pulses:      Dorsalis pedis pulses are 1+ on the right side and 1+ on the left side.       Posterior tibial pulses are 1+ on the right side and 1+ on the left side.  Spider veins and some varicosities  Pulmonary/Chest: Breath sounds normal. He has no wheezes. He has no rales. He exhibits  no tenderness.  Abdominal: Soft. Bowel sounds are normal. He exhibits no distension and no mass. There is no hepatomegaly. There is no abdominal tenderness.  Musculoskeletal: Normal range of motion.        General: No tenderness, deformity or edema.  Neurological: He is alert and oriented to person, place, and time. He has normal motor skills.  Skin: Skin is warm and dry. No rash noted. No cyanosis. No jaundice or pallor. Nails show no clubbing.    Diagnostic Studies & Laboratory data:   Conclusion     Previously placed Prox LAD to Mid LAD stent (unknown type) is widely patent.  1st Mrg lesion is 70% stenosed.  Prox RCA to Mid RCA lesion is 75%  stenosed.  Prox Cx lesion is 100% stenosed.  Ost LAD to Prox LAD lesion is 75% stenosed.  Mid LAD lesion is 50% stenosed.  LV end diastolic pressure is normal.  There is no aortic valve stenosis.   Three vessel CAD.  Plan for surgery consultation for CABG.  Check echo.  Consider restarting IV heparin after 8 hours if wrist site is ok.    Indications   Non-ST elevation (NSTEMI) myocardial infarction ([place]) [I21.4 (ICD-10-CM)]  Procedural Details   Technical Details The risks, benefits, and details of the procedure were explained to the patient. The patient verbalized understanding and wanted to proceed. Informed written consent was obtained.  PROCEDURE TECHNIQUE: After Xylocaine anesthesia a 33F slender sheath was placed in the right radial artery with a single anterior needle wall stick. IV Heparin was given. Right coronary angiography was done using a Judkins R4 guide catheter. Left coronary angiography was done using a Judkins L3.5 guide catheter. Left heart cath was done using a JR4 catheter. A TR band was used for hemostasis.  Contrast: 35 cc    Estimated blood loss <50 mL.   During this procedure medications were administered to achieve and maintain moderate conscious sedation while the patient's heart rate, blood  pressure, and oxygen saturation were continuously monitored and I was present face-to-face 100% of this time.  Medications  (Filter: Administrations occurring from 05/22/18 0819 to 05/22/18 0908)  Medication Rate/Dose/Volume Action  Date Time   midazolam (VERSED) injection (mg) 2 mg Given 05/22/18 0831   Total dose as of 05/22/18 1310        2 mg        fentaNYL (SUBLIMAZE) injection (mcg) 25 mcg Given 05/22/18 0831   Total dose as of 05/22/18 1310        25 mcg        Heparin (Porcine) in NaCl 1000-0.9 UT/500ML-% SOLN (mL) 500 mL Given 05/22/18 0832   Total dose as of 05/22/18 1310 500 mL Given 0832   1,000 mL        lidocaine (PF) (XYLOCAINE) 1 % injection (mL) 2 mL Given 05/22/18 0841   Total dose as of 05/22/18 1310        2 mL        Radial Cocktail/Verapamil only (mL) 10 mL Given 05/22/18 0842   Total dose as of 05/22/18 1310        10 mL        heparin injection (Units) 3,500 Units Given 05/22/18 0845   Total dose as of 05/22/18 1310        3,500 Units        iohexol (OMNIPAQUE) 350 MG/ML injection (mL) 45 mL Given 05/22/18 0859   Total dose as of 05/22/18 1310        45 mL        Sedation Time   Sedation Time Physician-1: 27 minutes 35 seconds  Complications   Complications documented before study signed (05/22/2018 9:14 AM EST)    No complications were associated with this study.  Documented by Jettie Booze, MD - 05/22/2018 9:13 AM EST    Coronary Findings   Diagnostic  Dominance: Right  Left Anterior Descending  Ost LAD to Prox LAD lesion 75% stenosed  Ost LAD to Prox LAD lesion is 75% stenosed. The lesion was previously treated using a drug eluting stent over 2 years ago.  Prox LAD to Mid LAD lesion 0% stenosed  Previously placed Prox LAD to Mid LAD  stent (unknown type) is widely patent.  Mid LAD lesion 50% stenosed  Mid LAD lesion is 50% stenosed.  Left Circumflex  Collaterals  Dist Cx filled by collaterals from Post Atrio.    Prox Cx lesion 100%  stenosed  Prox Cx lesion is 100% stenosed.  First Obtuse Marginal Branch  1st Mrg lesion 70% stenosed  1st Mrg lesion is 70% stenosed.  Right Coronary Artery  Prox RCA to Mid RCA lesion 75% stenosed  Prox RCA to Mid RCA lesion is 75% stenosed.  Intervention   No interventions have been documented.  Left Heart   Left Ventricle LV end diastolic pressure is normal.  Aortic Valve There is no aortic valve stenosis.  Coronary Diagrams   Diagnostic  Dominance: Right    Intervention   Patient Name:   Daniel Herrera Date of Exam: 05/22/2018 Medical Rec #:  [id number]          Height:       73.0 in Accession #:    [id number]         Weight:       203.7 lb Date of Birth:  [birth year]          BSA:          2.17 m Patient Age:    55 years           BP:           143/84 mmHg Patient Gender: M                  HR:           63 bpm. Exam Location:  Inpatient    Procedure: 2D Echo  Indications:    Chest Pain 786.50 / R07.9   History:        Patient has prior history of Echocardiogram examinations, most                 recent 02/23/2015. CAD and NSTEMI, Palpitations; Signs/Symptoms:                 Dyspnea; Risk Factors: Diabetes and Hypertension. CKD.   Sonographer:    Talmage Coin Referring Phys: [place]    1. The left ventricle has normal systolic function with an ejection fraction of 60-65%. The cavity size was normal. Left ventricular diastolic parameters were normal.  2. The right ventricle has normal systolic function. The cavity was normal. There is no increase in right ventricular wall thickness.  3. The mitral valve is normal in structure. Mild thickening of the mitral valve leaflet.  4. The tricuspid valve is normal in structure.  5. The aortic valve is tricuspid Mild thickening of the aortic valve Mild calcification of the aortic valve.  6. The pulmonic valve was normal in structure.  7. Mild ascending aortic root dilatation 3.8  cm.  FINDINGS  Left Ventricle: The left ventricle has normal systolic function, with an ejection fraction of 60-65%. The cavity size was normal. There is no increase in left ventricular wall thickness. Left ventricular diastolic parameters were normal Right Ventricle: The right ventricle has normal systolic function. The cavity was normal. There is no increase in right ventricular wall thickness.     Left Atrium: left atrial size was normal in size Right Atrium: right atrial size was normal in size.   Interatrial Septum: No atrial level shunt detected by color flow Doppler. Pericardium: There is no evidence of pericardial effusion. Mitral Valve: The  mitral valve is normal in structure. Mild thickening of the mitral valve leaflet. Mitral valve regurgitation is trivial by color flow Doppler. Tricuspid Valve: The tricuspid valve is normal in structure. Tricuspid valve regurgitation was not visualized by color flow Doppler. Aortic Valve: The aortic valve is tricuspid Mild thickening of the aortic valve and Mild calcification of the aortic valve Aortic valve regurgitation was not visualized by color flow Doppler. There is no evidence of aortic valve stenosis. Pulmonic Valve: The pulmonic valve was normal in structure. Pulmonic valve regurgitation is not visualized by color flow Doppler. Aorta: Mild ascending aortic root dilatation 3.8 cm. Venous: The inferior vena cava is normal in size with greater than 50% respiratory variability.   LEFT VENTRICLE PLAX 2D (Teich) LV EF:          69.2 %   Diastology LVIDd:          4.40 cm  LV e' lateral:   7.05 cm/s LVIDs:          2.70 cm  LV E/e' lateral: 9.5 LV PW:          1.10 cm  LV e' medial:    5.19 cm/s LV IVS:         1.10 cm  LV E/e' medial:  12.9 LVOT diam:      2.30 cm LV SV:          61 ml LVOT Area:      4.15 cm  RIGHT VENTRICLE RV S prime:     11.70 cm/s TAPSE (M-mode): 1.6 cm  LEFT ATRIUM             Index       RIGHT ATRIUM            Index LA diam:        3.60 cm 1.66 cm/m  RA Area:     13.90 cm LA Vol (A2C):   63.6 ml 29.33 ml/m RA Volume:   31.60 ml  14.57 ml/m LA Vol (A4C):   42.7 ml 19.69 ml/m LA Biplane Vol: 51.8 ml 23.89 ml/m  AORTIC VALVE LVOT Vmax:   86.80 cm/s LVOT Vmean:  56.100 cm/s LVOT VTI:    0.202 m   AORTA Ao Root diam: 3.10 cm  MITRAL VALVE MV Area (PHT): 2.24 cm MV PHT:        98.02 msec MV Decel Time: 338 msec MV E velocity: 66.80 cm/s MV A velocity: 87.40 cm/s MV E/A ratio:  0.76    Jenkins Rouge MD Electronically signed by Jenkins Rouge MD Signature Date/Time: 05/22/2018/1:12:00 PM         Recent Radiology Findings:   Dg Chest 2 View  Result Date: 05/20/2018 CLINICAL DATA:  Chest pain EXAM: CHEST - 2 VIEW COMPARISON:  12/21/2017, 03/13/2018 FINDINGS: Diffuse reticular opacity consistent with chronic interstitial disease. No acute consolidation or effusion. Normal cardiomediastinal silhouette. No pneumothorax. Apical pleural and parenchymal scarring. Displaced fracture involving the distal left clavicle. IMPRESSION: No active cardiopulmonary disease. Displaced fracture involving the distal left clavicle, noted on comparison study from 03/13/2018 Electronically Signed   By: Donavan Foil M.D.   On: 05/20/2018 23:13     I have independently reviewed the above radiologic studies and discussed with the patient   Recent Lab Findings: Lab Results  Component Value Date   WBC 3.8 (L) 05/22/2018   HGB 13.0 05/22/2018   HCT 39.5 05/22/2018   PLT 143 (L) 05/22/2018   GLUCOSE 139 (H) 05/22/2018  CHOL 118 05/24/2015   TRIG 324 (H) 07/31/2017   HDL 32 (L) 05/24/2015   LDLCALC 48 05/24/2015   ALT 24 12/21/2017   AST 29 12/21/2017   NA 141 05/22/2018   K 4.0 05/22/2018   CL 109 05/22/2018   CREATININE 1.38 (H) 05/22/2018   BUN 19 05/22/2018   CO2 26 05/22/2018   TSH 3.205 05/21/2018   INR 0.97 12/21/2017   HGBA1C 6.4 (H) 05/24/2015      Assessment / Plan: Severe  three-vessel coronary artery disease with positive troponin I Hypertension Hypothyroidism History of prostate cancer History of prolonged QT interval on EKG History of hypercholesterolemia(hypertriglyceridemia), low HDL History of kidney stones History of gout Street of GERD Arthritis History of angioedema/urticaria Elevated hemoglobin A1c, 6.4 Thrombocytopenia 143,000 Low WBC count 3.8  Plan: CABG    I  spent 60 minutes counseling the patient face to face.   John Giovanni, PA-C 05/22/2018 1:08 PM

## 2018-05-22 NOTE — Plan of Care (Signed)
  Problem: Education: Goal: Understanding of CV disease, CV risk reduction, and recovery process will improve Outcome: Progressing Goal: Individualized Educational Video(s) Outcome: Progressing   Problem: Activity: Goal: Ability to return to baseline activity level will improve Outcome: Progressing   

## 2018-05-22 NOTE — Interval H&P Note (Signed)
Cath Lab Visit (complete for each Cath Lab visit)  Clinical Evaluation Leading to the Procedure:   ACS: Yes.    Non-ACS:    Anginal Classification: CCS IV  Anti-ischemic medical therapy: Minimal Therapy (1 class of medications)  Non-Invasive Test Results: No non-invasive testing performed  Prior CABG: No previous CABG      History and Physical Interval Note:  05/22/2018 8:23 AM  Daniel Herrera  has presented today for surgery, with the diagnosis of cp  The various methods of treatment have been discussed with the patient and family. After consideration of risks, benefits and other options for treatment, the patient has consented to  Procedure(s): LEFT HEART CATH AND CORONARY ANGIOGRAPHY (N/A) as a surgical intervention .  The patient's history has been reviewed, patient examined, no change in status, stable for surgery.  I have reviewed the patient's chart and labs.  Questions were answered to the patient's satisfaction.     Larae Grooms

## 2018-05-22 NOTE — Progress Notes (Signed)
  Echocardiogram 2D Echocardiogram has been performed.  Daniel Herrera 05/22/2018, 12:03 PM

## 2018-05-22 NOTE — Progress Notes (Signed)
[place] for heparin Indication: chest pain/ACS  Allergies  Allergen Reactions  . Lisinopril Swelling    Facial and lip swelling. And throat swelling.   . Benadryl [Diphenhydramine Hcl] Swelling    Facial swelling from high dose (tolerates Advil PM as needed)  . Darvocet [Propoxyphene N-Acetaminophen] Other (See Comments)    hallucination  . Darvon [Propoxyphene Hcl] Other (See Comments)    Hallucination   . Tylenol [Acetaminophen] Swelling    Foot swelling    Patient Measurements: Height: 6\' 1"  (185.4 cm) Weight: 203 lb 11.2 oz (92.4 kg) IBW/kg (Calculated) : 79.9 Heparin Dosing Weight: 94.7 kg  Vital Signs: Temp: 97.8 F (36.6 C) (02/19 0527) Temp Source: Oral (02/19 0527) BP: 143/84 (02/19 0859) Pulse Rate: 65 (02/19 0859)  Labs: Recent Labs    05/20/18 2240 05/21/18 1058 05/21/18 1643 05/21/18 1700 05/22/18 0027  HGB 14.6  --   --   --  13.0  HCT 45.5  --   --   --  39.5  PLT 142*  --   --   --  143*  HEPARINUNFRC  --   --   --  0.48 0.38  CREATININE 1.59*  --   --   --  1.38*  TROPONINI  --  0.59* 0.38*  --  0.25*    Estimated Creatinine Clearance: 49.1 mL/min (A) (by C-G formula based on SCr of 1.38 mg/dL (H)).   Medical History: Past Medical History:  Diagnosis Date  . Angio-edema   . Anxiety   . Arthritis   . CAD (coronary artery disease)    a. 02/2015: DES to mid-LAD  . GERD (gastroesophageal reflux disease)   . Gout   . High cholesterol   . History of prolonged Q-T interval on ECG   . Hx of Clostridium difficile infection   . Hx of umbilical hernia repair   . Personal history of digestive disease    gastric ulcer  . Prostate cancer ([place])   . Prostate enlargement   . Skin cancer    basal cell carcinoma  . Thyroid disease   . Urticaria     Assessment: 78 yo M presents with CP. He is now s/p cath with 3VCAD and for CABG consult. Pharmacy to restart heparin (sheath removed ~ 9am; TR band  applied) -hg= 13.0, plt= 143  Goal of Therapy:  Heparin level 0.3-0.7 units/ml Monitor platelets by anticoagulation protocol: Yes   Plan:  Restart heparin gtt at 1,200 units/hr at 5pm Monitor daily heparin level, CBC  Hildred Laser, PharmD Clinical Pharmacist **Pharmacist phone directory can now be found on [email or web address] ([email or web address]).  Listed under [place].

## 2018-05-22 NOTE — Progress Notes (Signed)
Pre CABG has been completed.   Preliminary results in CV Proc.   Daniel Herrera 05/22/2018 4:07 PM

## 2018-05-22 NOTE — Progress Notes (Signed)
TCTS consulted for CABG evaluation. °

## 2018-05-22 NOTE — Progress Notes (Addendum)
The patient has been seen in conjunction with Daniel Homes, MD. All aspects of care have been considered and discussed. The patient has been personally interviewed, examined, and all clinical data has been reviewed.   Severe three-vessel disease including total occlusion of the mid to distal circumflex, high-grade obstruction first obtuse marginal, in-stent restenosis in proximal/ostial LAD stent, and significant progression with proximal segmental 70% RCA.  T CTS consultation to consider surgical revascularization.  Discontinue nonsteroidal anti-inflammatory therapy.  Could potentially discharge home and be treated electively if needed.  I believe his presentation was precipitated by an arrhythmia, possibly atrial fibrillation.  This is not yet been documented.   Progress Note  Patient Name: Daniel Herrera Date of Encounter: 05/22/2018  Primary Cardiologist: Daniel Burow, MD   Subjective   Patient feeling well s/p cath. He is not having any CP or SOB. Discussed the results of his left heart cath. Voices understanding.   Of note his daughter is in the ICU in [place] with PNA and he tells me that her situation is not looking good.   Inpatient Medications    Scheduled Meds: . aspirin EC  81 mg Oral Daily  . azelastine  1 spray Each Nare BID  . fluticasone  1 spray Each Nare Daily  . levothyroxine  75 mcg Oral QAC breakfast  . metoprolol tartrate  25 mg Oral BID  . nabumetone  750 mg Oral Daily  . pantoprazole  40 mg Oral BID  . pravastatin  80 mg Oral q1800  . sodium chloride flush  3 mL Intravenous Once   Continuous Infusions: . sodium chloride 94.7 mL/hr at 05/22/18 0600  . heparin 1,200 Units/hr (05/22/18 0354)   PRN Meds: nitroGLYCERIN, ondansetron **OR** ondansetron (ZOFRAN) IV   Vital Signs    Vitals:   05/22/18 0844 05/22/18 0849 05/22/18 0854 05/22/18 0859  BP: 139/86 138/84 (!) 158/87 (!) 143/84  Pulse: 66 66 64 65  Resp: 11 12 (!) 71 18    Temp:      TempSrc:      SpO2: 99% 97% 98% 99%  Weight:      Height:        Intake/Output Summary (Last 24 hours) at 05/22/2018 0941 Last data filed at 05/22/2018 0600 Gross per 24 hour  Intake 995.34 ml  Output 1350 ml  Net -354.66 ml   Filed Weights   05/21/18 1856 05/22/18 0527  Weight: 92.3 kg 92.4 kg    Telemetry    NSR - Personally Reviewed  ECG    NSR with left axis deviation - Personally Reviewed  Physical Exam   Today's Vitals   05/22/18 0854 05/22/18 0855 05/22/18 0859 05/22/18 0901  BP: (!) 158/87  (!) 143/84   Pulse: 64  65   Resp: (!) 71  18   Temp:      TempSrc:      SpO2: 98%  99%   Weight:      Height:      PainSc:  Asleep  0-No pain   Body mass index is 26.87 kg/m.  GEN: No acute distress.   Neck: No JVD Cardiac: RRR, no murmurs, rubs, or gallops.  Respiratory: Clear to auscultation bilaterally. GI: Soft, nontender, non-distended  MS: No edema; No deformity. Neuro:  Nonfocal  Psych: Normal affect   Labs    Chemistry Recent Labs  Lab 05/20/18 2240 05/22/18 0027  NA 136 141  K 3.7 4.0  CL 106 109  CO2 20* 26  GLUCOSE 240* 139*  BUN 22 19  CREATININE 1.59* 1.38*  CALCIUM 9.3 8.8*  GFRNONAA 41* 48*  GFRAA 47* 56*  ANIONGAP 10 6    Hematology Recent Labs  Lab 05/20/18 2240 05/22/18 0027  WBC 5.1 3.8*  RBC 4.94 4.35  HGB 14.6 13.0  HCT 45.5 39.5  MCV 92.1 90.8  MCH 29.6 29.9  MCHC 32.1 32.9  RDW 12.8 12.8  PLT 142* 143*   Cardiac Enzymes Recent Labs  Lab 05/21/18 1058 05/21/18 1643 05/22/18 0027  TROPONINI 0.59* 0.38* 0.25*    Recent Labs  Lab 05/20/18 2251 05/21/18 0811  TROPIPOC 0.04 0.59*     BNPNo results for input(s): BNP, PROBNP in the last 168 hours.   DDimer No results for input(s): DDIMER in the last 168 hours.   Radiology    Dg Chest 2 View  Result Date: 05/20/2018 CLINICAL DATA:  Chest pain EXAM: CHEST - 2 VIEW COMPARISON:  12/21/2017, 03/13/2018 FINDINGS: Diffuse reticular opacity  consistent with chronic interstitial disease. No acute consolidation or effusion. Normal cardiomediastinal silhouette. No pneumothorax. Apical pleural and parenchymal scarring. Displaced fracture involving the distal left clavicle. IMPRESSION: No active cardiopulmonary disease. Displaced fracture involving the distal left clavicle, noted on comparison study from 03/13/2018 Electronically Signed   By: Donavan Foil M.D.   On: 05/20/2018 23:13   Cardiac Studies   Left Heart Cath 05/22/2018  Previously placed Prox LAD to Mid LAD stent (unknown type) is widely patent.  1st Mrg lesion is 70% stenosed.  Prox RCA to Mid RCA lesion is 75% stenosed.  Prox Cx lesion is 100% stenosed.  Ost LAD to Prox LAD lesion is 75% stenosed.  Mid LAD lesion is 50% stenosed.  LV end diastolic pressure is normal.  There is no aortic valve stenosis.  Patient Profile     Daniel Herrera is a 78 y.o.male with known CAD status post drug-eluting stent to the mid and proximal LAD in 2016 who presented to the emergency department with severe substernal chest pressure.  Assessment & Plan    Elevated Troponin  CAD s/p DES to the mid and proximal LAD in 2016 - Chest pain improved.  - S/p left heart cath that illustrated multi-vessel disease  - Restart heparin ggt 8 hours post cath  - Continue ASA and metoprolol tartrate 25 mg BID - Not on lisinopril due to angioedema  - Hold off on DAPT until CT surgery evaluation  - Consult CT surgery for evaluation of CABG  CKD Stage II-III - Creatinine at baseline  - Post cath hydration   Hypertension  - Continue Metoprolol Tartrate 25 mg BID   HLD - Continue pitavastatin - Will need to change to high intensity statin   Will discuss case further with Dr. Tamala Herrera.   For questions or updates, please contact Daniel Herrera Please consult [email or web address] for contact info under Cardiology/STEMI.   Signed, Daniel Homes, MD  05/22/2018, 9:41 AM

## 2018-05-23 ENCOUNTER — Inpatient Hospital Stay (HOSPITAL_COMMUNITY): Payer: Medicare Other

## 2018-05-23 ENCOUNTER — Inpatient Hospital Stay (HOSPITAL_COMMUNITY): Payer: Self-pay

## 2018-05-23 ENCOUNTER — Encounter (HOSPITAL_COMMUNITY): Payer: Self-pay | Admitting: Anesthesiology

## 2018-05-23 DIAGNOSIS — Z01810 Encounter for preprocedural cardiovascular examination: Secondary | ICD-10-CM

## 2018-05-23 LAB — CBC
HCT: 38.6 % — ABNORMAL LOW (ref 39.0–52.0)
Hemoglobin: 12.7 g/dL — ABNORMAL LOW (ref 13.0–17.0)
MCH: 30 pg (ref 26.0–34.0)
MCHC: 32.9 g/dL (ref 30.0–36.0)
MCV: 91 fL (ref 80.0–100.0)
PLATELETS: 147 10*3/uL — AB (ref 150–400)
RBC: 4.24 MIL/uL (ref 4.22–5.81)
RDW: 12.8 % (ref 11.5–15.5)
WBC: 3.6 10*3/uL — AB (ref 4.0–10.5)
nRBC: 0 % (ref 0.0–0.2)

## 2018-05-23 LAB — SURGICAL PCR SCREEN
MRSA, PCR: NEGATIVE
Staphylococcus aureus: POSITIVE — AB

## 2018-05-23 LAB — URINALYSIS, ROUTINE W REFLEX MICROSCOPIC
Bilirubin Urine: NEGATIVE
Glucose, UA: NEGATIVE mg/dL
Hgb urine dipstick: NEGATIVE
Ketones, ur: NEGATIVE mg/dL
LEUKOCYTE UA: NEGATIVE
Nitrite: NEGATIVE
Protein, ur: NEGATIVE mg/dL
Specific Gravity, Urine: 1.008 (ref 1.005–1.030)
pH: 7 (ref 5.0–8.0)

## 2018-05-23 LAB — PULMONARY FUNCTION TEST
DL/VA % pred: 79 %
DL/VA: 3.05 ml/min/mmHg/L
DLCO COR: 16.58 ml/min/mmHg
DLCO UNC: 15.62 ml/min/mmHg
DLCO cor % pred: 61 %
DLCO unc % pred: 58 %
FEF 25-75 Post: 3.66 L/sec
FEF 25-75 Pre: 3.02 L/sec
FEF2575-%Change-Post: 21 %
FEF2575-%Pred-Post: 158 %
FEF2575-%Pred-Pre: 130 %
FEV1-%Change-Post: 3 %
FEV1-%PRED-POST: 100 %
FEV1-%Pred-Pre: 97 %
FEV1-Post: 3.33 L
FEV1-Pre: 3.22 L
FEV1FVC-%Change-Post: 2 %
FEV1FVC-%Pred-Pre: 110 %
FEV6-%Change-Post: 0 %
FEV6-%Pred-Post: 94 %
FEV6-%Pred-Pre: 94 %
FEV6-Post: 4.06 L
FEV6-Pre: 4.05 L
FEV6FVC-%Change-Post: 0 %
FEV6FVC-%Pred-Post: 105 %
FEV6FVC-%Pred-Pre: 106 %
FVC-%Change-Post: 0 %
FVC-%Pred-Post: 89 %
FVC-%Pred-Pre: 88 %
FVC-Post: 4.09 L
FVC-Pre: 4.07 L
PRE FEV6/FVC RATIO: 100 %
Post FEV1/FVC ratio: 81 %
Post FEV6/FVC ratio: 99 %
Pre FEV1/FVC ratio: 79 %

## 2018-05-23 LAB — BASIC METABOLIC PANEL
Anion gap: 6 (ref 5–15)
BUN: 16 mg/dL (ref 8–23)
CO2: 25 mmol/L (ref 22–32)
Calcium: 8.9 mg/dL (ref 8.9–10.3)
Chloride: 108 mmol/L (ref 98–111)
Creatinine, Ser: 1.49 mg/dL — ABNORMAL HIGH (ref 0.61–1.24)
GFR calc non Af Amer: 44 mL/min — ABNORMAL LOW (ref >60)
GFR, EST AFRICAN AMERICAN: 51 mL/min — AB (ref >60)
Glucose, Bld: 128 mg/dL — ABNORMAL HIGH (ref 70–99)
POTASSIUM: 4.1 mmol/L (ref 3.5–5.1)
Sodium: 139 mmol/L (ref 135–145)

## 2018-05-23 LAB — BLOOD GAS, ARTERIAL
Acid-base deficit: 0.8 mmol/L (ref 0.0–2.0)
Bicarbonate: 22.9 mmol/L (ref 20.0–28.0)
DRAWN BY: 51185
FIO2: 21
O2 Saturation: 98 %
Patient temperature: 98.6
pCO2 arterial: 34.8 mmHg (ref 32.0–48.0)
pH, Arterial: 7.433 (ref 7.350–7.450)
pO2, Arterial: 99.6 mmHg (ref 83.0–108.0)

## 2018-05-23 LAB — HEMOGLOBIN A1C
Hgb A1c MFr Bld: 6.5 % — ABNORMAL HIGH (ref 4.8–5.6)
MEAN PLASMA GLUCOSE: 140 mg/dL

## 2018-05-23 LAB — HEPARIN LEVEL (UNFRACTIONATED)
Heparin Unfractionated: 0.26 IU/mL — ABNORMAL LOW (ref 0.30–0.70)
Heparin Unfractionated: 0.5 IU/mL (ref 0.30–0.70)

## 2018-05-23 LAB — TYPE AND SCREEN
ABO/RH(D): O NEG
Antibody Screen: NEGATIVE

## 2018-05-23 LAB — APTT: aPTT: 104 seconds — ABNORMAL HIGH (ref 24–36)

## 2018-05-23 MED ORDER — NOREPINEPHRINE 4 MG/250ML-% IV SOLN
0.0000 ug/min | INTRAVENOUS | Status: DC
  Filled 2018-05-23: qty 250

## 2018-05-23 MED ORDER — PLASMA-LYTE 148 IV SOLN
INTRAVENOUS | Status: AC
  Administered 2018-05-24: 500 mL
  Filled 2018-05-23: qty 2.5

## 2018-05-23 MED ORDER — NITROGLYCERIN IN D5W 200-5 MCG/ML-% IV SOLN
2.0000 ug/min | INTRAVENOUS | Status: AC
  Administered 2018-05-24: 15 ug/min via INTRAVENOUS
  Filled 2018-05-23: qty 250

## 2018-05-23 MED ORDER — EPINEPHRINE PF 1 MG/ML IJ SOLN
0.0000 ug/min | INTRAVENOUS | Status: DC
  Filled 2018-05-23: qty 4

## 2018-05-23 MED ORDER — METOPROLOL TARTRATE 12.5 MG HALF TABLET
12.5000 mg | ORAL_TABLET | Freq: Once | ORAL | Status: AC
  Administered 2018-05-24: 12.5 mg via ORAL
  Filled 2018-05-23: qty 1

## 2018-05-23 MED ORDER — INSULIN REGULAR(HUMAN) IN NACL 100-0.9 UT/100ML-% IV SOLN
INTRAVENOUS | Status: AC
  Administered 2018-05-24: 1 [IU]/h via INTRAVENOUS
  Filled 2018-05-23: qty 100

## 2018-05-23 MED ORDER — POTASSIUM CHLORIDE 2 MEQ/ML IV SOLN
80.0000 meq | INTRAVENOUS | Status: DC
  Filled 2018-05-23: qty 40

## 2018-05-23 MED ORDER — TRANEXAMIC ACID 1000 MG/10ML IV SOLN
1.5000 mg/kg/h | INTRAVENOUS | Status: AC
  Administered 2018-05-24: 1.5 mg/kg/h via INTRAVENOUS
  Filled 2018-05-23: qty 25

## 2018-05-23 MED ORDER — MUPIROCIN 2 % EX OINT
1.0000 "application " | TOPICAL_OINTMENT | Freq: Two times a day (BID) | CUTANEOUS | Status: DC
  Administered 2018-05-23 – 2018-05-27 (×8): 1 via NASAL
  Filled 2018-05-23 (×3): qty 22

## 2018-05-23 MED ORDER — MAGNESIUM SULFATE 50 % IJ SOLN
40.0000 meq | INTRAMUSCULAR | Status: DC
  Filled 2018-05-23: qty 9.85

## 2018-05-23 MED ORDER — TEMAZEPAM 7.5 MG PO CAPS
15.0000 mg | ORAL_CAPSULE | Freq: Once | ORAL | Status: AC | PRN
  Administered 2018-05-23: 15 mg via ORAL
  Filled 2018-05-23: qty 2

## 2018-05-23 MED ORDER — CHLORHEXIDINE GLUCONATE CLOTH 2 % EX PADS: 6.0000 | MEDICATED_PAD | Freq: Once | CUTANEOUS | Status: DC

## 2018-05-23 MED ORDER — VANCOMYCIN HCL 10 G IV SOLR
1500.0000 mg | INTRAVENOUS | Status: AC
  Administered 2018-05-24: 1500 mg via INTRAVENOUS
  Filled 2018-05-23: qty 1500

## 2018-05-23 MED ORDER — DIAZEPAM 5 MG PO TABS
5.0000 mg | ORAL_TABLET | Freq: Once | ORAL | Status: AC
  Administered 2018-05-24: 5 mg via ORAL
  Filled 2018-05-23: qty 1

## 2018-05-23 MED ORDER — DEXMEDETOMIDINE HCL IN NACL 400 MCG/100ML IV SOLN
0.1000 ug/kg/h | INTRAVENOUS | Status: AC
  Administered 2018-05-24: .4 ug/kg/h via INTRAVENOUS
  Filled 2018-05-23: qty 100

## 2018-05-23 MED ORDER — SODIUM CHLORIDE 0.9 % IV SOLN
INTRAVENOUS | Status: DC
  Filled 2018-05-23: qty 30

## 2018-05-23 MED ORDER — BISACODYL 5 MG PO TBEC
5.0000 mg | DELAYED_RELEASE_TABLET | Freq: Once | ORAL | Status: AC
  Administered 2018-05-23: 5 mg via ORAL
  Filled 2018-05-23: qty 1

## 2018-05-23 MED ORDER — TRANEXAMIC ACID (OHS) PUMP PRIME SOLUTION
2.0000 mg/kg | INTRAVENOUS | Status: DC
  Filled 2018-05-23: qty 1.84

## 2018-05-23 MED ORDER — DOPAMINE-DEXTROSE 3.2-5 MG/ML-% IV SOLN
0.0000 ug/kg/min | INTRAVENOUS | Status: AC
  Administered 2018-05-24: 2 ug/kg/min via INTRAVENOUS
  Filled 2018-05-23: qty 250

## 2018-05-23 MED ORDER — PHENYLEPHRINE HCL-NACL 20-0.9 MG/250ML-% IV SOLN
30.0000 ug/min | INTRAVENOUS | Status: AC
  Administered 2018-05-24: 25 ug/min via INTRAVENOUS
  Filled 2018-05-23: qty 250

## 2018-05-23 MED ORDER — SODIUM CHLORIDE 0.9 % IV SOLN
750.0000 mg | INTRAVENOUS | Status: DC
  Filled 2018-05-23: qty 750

## 2018-05-23 MED ORDER — SODIUM CHLORIDE 0.9 % IV SOLN
1.5000 g | INTRAVENOUS | Status: AC
  Administered 2018-05-24: 1.5 g via INTRAVENOUS
  Filled 2018-05-23: qty 1.5

## 2018-05-23 MED ORDER — CHLORHEXIDINE GLUCONATE CLOTH 2 % EX PADS
6.0000 | MEDICATED_PAD | Freq: Once | CUTANEOUS | Status: AC
  Administered 2018-05-23: 6 via TOPICAL

## 2018-05-23 MED ORDER — TRANEXAMIC ACID (OHS) BOLUS VIA INFUSION
15.0000 mg/kg | INTRAVENOUS | Status: AC
  Administered 2018-05-24: 1383 mg via INTRAVENOUS
  Filled 2018-05-23: qty 1383

## 2018-05-23 MED ORDER — ALBUTEROL SULFATE (2.5 MG/3ML) 0.083% IN NEBU
2.5000 mg | INHALATION_SOLUTION | Freq: Once | RESPIRATORY_TRACT | Status: AC
  Administered 2018-05-23: 2.5 mg via RESPIRATORY_TRACT

## 2018-05-23 MED ORDER — MILRINONE LACTATE IN DEXTROSE 20-5 MG/100ML-% IV SOLN
0.3000 ug/kg/min | INTRAVENOUS | Status: DC
  Filled 2018-05-23: qty 100

## 2018-05-23 MED ORDER — CHLORHEXIDINE GLUCONATE 0.12 % MT SOLN
15.0000 mL | Freq: Once | OROMUCOSAL | Status: AC
  Administered 2018-05-24: 15 mL via OROMUCOSAL
  Filled 2018-05-23: qty 15

## 2018-05-23 NOTE — Progress Notes (Signed)
[place] for heparin Indication: 3V CAD, awaiting CABG  Allergies  Allergen Reactions  . Lisinopril Swelling    Facial and lip swelling. And throat swelling.   . Benadryl [Diphenhydramine Hcl] Swelling    Facial swelling from high dose (tolerates Advil PM as needed)  . Darvocet [Propoxyphene N-Acetaminophen] Other (See Comments)    hallucination  . Darvon [Propoxyphene Hcl] Other (See Comments)    Hallucination   . Tylenol [Acetaminophen] Swelling    Foot swelling    Patient Measurements: Height: 6\' 1"  (185.4 cm) Weight: 203 lb 11.2 oz (92.4 kg) IBW/kg (Calculated) : 79.9 Heparin Dosing Weight: 94.7 kg  Vital Signs: Temp: 97.6 F (36.4 C) (02/19 2118) Temp Source: Oral (02/19 2118) BP: 148/72 (02/19 2118) Pulse Rate: 58 (02/19 2118)  Labs: Recent Labs    05/20/18 2240 05/21/18 1058 05/21/18 1643 05/21/18 1700 05/22/18 0027 05/23/18 0110  HGB 14.6  --   --   --  13.0 12.7*  HCT 45.5  --   --   --  39.5 38.6*  PLT 142*  --   --   --  143* 147*  HEPARINUNFRC  --   --   --  0.48 0.38 0.26*  CREATININE 1.59*  --   --   --  1.38* 1.49*  TROPONINI  --  0.59* 0.38*  --  0.25*  --     Estimated Creatinine Clearance: 45.4 mL/min (A) (by C-G formula based on SCr of 1.49 mg/dL (H)).  Assessment: 78 yo M presents with CP. He is now s/p cath with 3VCAD and for CABG consult. Heparin restarted post-cath. Plan for surgery Friday morning.  Heparin level slightly subtherapeutic (0.26) on gtt at 1200 units/hr. CBC ok. No issues with line or bleeding reported per RN.  Goal of Therapy:  Heparin level 0.3-0.7 units/ml Monitor platelets by anticoagulation protocol: Yes   Plan:  Increase heparin gtt to 1400 units/hr  Will f/u 8hr heparin level  Sherlon Handing, PharmD, BCPS Clinical pharmacist  **Pharmacist phone directory can now be found on [email or web address] ([email or web address]).  Listed under [place]. 05/23/2018 1:55 AM

## 2018-05-23 NOTE — Anesthesia Preprocedure Evaluation (Addendum)
Anesthesia Evaluation  Patient identified by MRN, date of birth, ID band Patient awake    Reviewed: Allergy & Precautions, H&P , NPO status , Patient's Chart, lab work & pertinent test results  Airway Mallampati: II   Neck ROM: full    Dental   Pulmonary shortness of breath, pneumonia,    breath sounds clear to auscultation       Cardiovascular hypertension, Pt. on home beta blockers + CAD, + Past MI and + Cardiac Stents   Rhythm:regular Rate:Normal     Neuro/Psych Anxiety negative neurological ROS     GI/Hepatic Neg liver ROS, GERD  Medicated,  Endo/Other  diabetes, Type 2  Renal/GU Renal InsufficiencyRenal disease     Musculoskeletal  (+) Arthritis ,   Abdominal   Peds  Hematology negative hematology ROS (+)   Anesthesia Other Findings   Reproductive/Obstetrics                            Lab Results  Component Value Date   WBC 3.6 (L) 05/23/2018   HGB 12.7 (L) 05/23/2018   HCT 38.6 (L) 05/23/2018   MCV 91.0 05/23/2018   PLT 147 (L) 05/23/2018   Lab Results  Component Value Date   CREATININE 1.49 (H) 05/23/2018   BUN 16 05/23/2018   NA 139 05/23/2018   K 4.1 05/23/2018   CL 108 05/23/2018   CO2 25 05/23/2018   Lab Results  Component Value Date   INR 0.97 12/21/2017   Echo: 1. The left ventricle has normal systolic function with an ejection fraction of 60-65%. The cavity size was normal. Left ventricular diastolic parameters were normal.  2. The right ventricle has normal systolic function. The cavity was normal. There is no increase in right ventricular wall thickness.  3. The mitral valve is normal in structure. Mild thickening of the mitral valve leaflet.  4. The tricuspid valve is normal in structure.  5. The aortic valve is tricuspid Mild thickening of the aortic valve Mild calcification of the aortic valve.  6. The pulmonic valve was normal in structure.  7. Mild  ascending aortic root dilatation 3.8 cm.   Anesthesia Physical Anesthesia Plan  ASA: IV  Anesthesia Plan: General   Post-op Pain Management:    Induction: Intravenous  PONV Risk Score and Plan: 2 and Treatment may vary due to age or medical condition, Ondansetron, Dexamethasone and Midazolam  Airway Management Planned: Oral ETT  Additional Equipment: Arterial line, CVP, PA Cath, TEE and Ultrasound Guidance Line Placement  Intra-op Plan:   Post-operative Plan: Post-operative intubation/ventilation  Informed Consent: I have reviewed the patients History and Physical, chart, labs and discussed the procedure including the risks, benefits and alternatives for the proposed anesthesia with the patient or authorized representative who has indicated his/her understanding and acceptance.       Plan Discussed with: CRNA, Anesthesiologist and Surgeon  Anesthesia Plan Comments:        Anesthesia Quick Evaluation

## 2018-05-23 NOTE — Care Management Important Message (Signed)
Important Message  Patient Details  Name: Daniel Herrera MRN: [id number] Date of Birth: Feb 27, 1941   Medicare Important Message Given:  Yes    [name] P [place] 05/23/2018, 12:07 PM

## 2018-05-23 NOTE — Progress Notes (Addendum)
The patient has been seen in conjunction with Ina Homes, M.D.. All aspects of care have been considered and discussed. The patient has been personally interviewed, examined, and all clinical data has been reviewed.   Agree with contents of this note. Awaiting CABG on 05/24/2018.   Progress Note  Patient Name: Daniel Herrera Date of Encounter: 05/23/2018  Primary Cardiologist: Quay Burow, MD   Subjective   Feeling well this AM. No CP or SOB. Understands plan for CABG. No questions or concerns.   Inpatient Medications    Scheduled Meds: . aspirin EC  81 mg Oral Daily  . azelastine  1 spray Each Nare BID  . bisacodyl  5 mg Oral Once  . [START ON 05/24/2018] chlorhexidine  15 mL Mouth/Throat Once  . Chlorhexidine Gluconate Cloth  6 each Topical Once   And  . Chlorhexidine Gluconate Cloth  6 each Topical Once  . [START ON 05/24/2018] diazepam  5 mg Oral Once  . fluticasone  1 spray Each Nare Daily  . levothyroxine  75 mcg Oral QAC breakfast  . [START ON 05/24/2018] metoprolol tartrate  12.5 mg Oral Once  . metoprolol tartrate  25 mg Oral BID  . pantoprazole  40 mg Oral BID  . pravastatin  80 mg Oral q1800  . sodium chloride flush  3 mL Intravenous Once  . sodium chloride flush  3 mL Intravenous Q12H   Continuous Infusions: . sodium chloride 94.7 mL/hr at 05/22/18 0600  . sodium chloride    . heparin 1,400 Units/hr (05/23/18 0200)   PRN Meds: sodium chloride, acetaminophen, nitroGLYCERIN, ondansetron (ZOFRAN) IV, ondansetron **OR** [DISCONTINUED] ondansetron (ZOFRAN) IV, sodium chloride flush, temazepam   Vital Signs    Vitals:   05/22/18 0859 05/22/18 1212 05/22/18 2118 05/23/18 0608  BP: (!) 143/84 (!) 143/84 (!) 148/72 (!) 166/91  Pulse: 65 69 (!) 58 60  Resp: 18 20 20 16   Temp:  (!) 97.5 F (36.4 C) 97.6 F (36.4 C) 97.6 F (36.4 C)  TempSrc:  Axillary Oral Oral  SpO2: 99% 98% 98% 98%  Weight:    92.2 kg  Height:        Intake/Output Summary  (Last 24 hours) at 05/23/2018 0919 Last data filed at 05/23/2018 0500 Gross per 24 hour  Intake 717.18 ml  Output 950 ml  Net -232.82 ml   Filed Weights   05/21/18 1856 05/22/18 0527 05/23/18 0608  Weight: 92.3 kg 92.4 kg 92.2 kg    Telemetry    NSR - Personally Reviewed  ECG    NSR with left axis deviation - Personally Reviewed  Physical Exam   Today's Vitals   05/22/18 2030 05/22/18 2118 05/23/18 0608 05/23/18 0831  BP:  (!) 148/72 (!) 166/91   Pulse:  (!) 58 60   Resp:  20 16   Temp:  97.6 F (36.4 C) 97.6 F (36.4 C)   TempSrc:  Oral Oral   SpO2:  98% 98%   Weight:   92.2 kg   Height:      PainSc: 0-No pain   0-No pain   Body mass index is 26.82 kg/m.  GEN: No acute distress.   Neck: No JVD Cardiac: RRR, no murmurs, rubs, or gallops.  Respiratory: Clear to auscultation bilaterally. GI: Soft, nontender, non-distended  MS: No edema; No deformity. Neuro:  Nonfocal  Psych: Normal affect   Labs    Chemistry Recent Labs  Lab 05/20/18 2240 05/22/18 0027 05/23/18 0110  NA 136 141  139  K 3.7 4.0 4.1  CL 106 109 108  CO2 20* 26 25  GLUCOSE 240* 139* 128*  BUN 22 19 16   CREATININE 1.59* 1.38* 1.49*  CALCIUM 9.3 8.8* 8.9  GFRNONAA 41* 48* 44*  GFRAA 47* 56* 51*  ANIONGAP 10 6 6     Hematology Recent Labs  Lab 05/20/18 2240 05/22/18 0027 05/23/18 0110  WBC 5.1 3.8* 3.6*  RBC 4.94 4.35 4.24  HGB 14.6 13.0 12.7*  HCT 45.5 39.5 38.6*  MCV 92.1 90.8 91.0  MCH 29.6 29.9 30.0  MCHC 32.1 32.9 32.9  RDW 12.8 12.8 12.8  PLT 142* 143* 147*   Cardiac Enzymes Recent Labs  Lab 05/21/18 1058 05/21/18 1643 05/22/18 0027  TROPONINI 0.59* 0.38* 0.25*    Recent Labs  Lab 05/20/18 2251 05/21/18 0811  TROPIPOC 0.04 0.59*     BNPNo results for input(s): BNP, PROBNP in the last 168 hours.   DDimer No results for input(s): DDIMER in the last 168 hours.   Radiology    Vas US Doppler Pre Cabg  Result Date: 05/22/2018 PREOPERATIVE VASCULAR  EVALUATION  Indications:  Pre cabg. Risk Factors: Hypertension. Performing Technologist: Abram Sander RVS  Examination Guidelines: A complete evaluation includes B-mode imaging, spectral Doppler, color Doppler, and power Doppler as needed of all accessible portions of each vessel. Bilateral testing is considered an integral part of a complete examination. Limited examinations for reoccurring indications may be performed as noted.  Right Carotid Findings: +----------+--------+--------+--------+-----------+--------+           PSV cm/sEDV cm/sStenosisDescribe   Comments +----------+--------+--------+--------+-----------+--------+ CCA Prox  69      12              homogeneous         +----------+--------+--------+--------+-----------+--------+ CCA Distal62      16              homogeneous         +----------+--------+--------+--------+-----------+--------+ ICA Prox  62      16      1-39%   homogeneous         +----------+--------+--------+--------+-----------+--------+ ICA Distal68      23                                  +----------+--------+--------+--------+-----------+--------+ ECA       71      8                                   +----------+--------+--------+--------+-----------+--------+ Portions of this table do not appear on this page. +----------+--------+-------+--------+------------+           PSV cm/sEDV cmsDescribeArm Pressure +----------+--------+-------+--------+------------+ Subclavian93                     137          +----------+--------+-------+--------+------------+ +---------+--------+--+--------+--+---------+ VertebralPSV cm/s51EDV cm/s14Antegrade +---------+--------+--+--------+--+---------+ Left Carotid Findings: +----------+--------+--------+--------+-----------+--------+           PSV cm/sEDV cm/sStenosisDescribe   Comments +----------+--------+--------+--------+-----------+--------+ CCA Prox  79      20               homogeneous         +----------+--------+--------+--------+-----------+--------+ CCA Distal71      20              homogeneous         +----------+--------+--------+--------+-----------+--------+  ICA Prox  84      22      1-39%   homogeneous         +----------+--------+--------+--------+-----------+--------+ ICA Distal90      29                                  +----------+--------+--------+--------+-----------+--------+ ECA       82      10                                  +----------+--------+--------+--------+-----------+--------+ +----------+--------+--------+--------+------------+ SubclavianPSV cm/sEDV cm/sDescribeArm Pressure +----------+--------+--------+--------+------------+           86                      137          +----------+--------+--------+--------+------------+ +---------+--------+--+--------+--+---------+ VertebralPSV cm/s42EDV cm/s11Antegrade +---------+--------+--+--------+--+---------+  ABI Findings: +--------+------------------+-----+---------+--------+ Right   Rt Pressure (mmHg)IndexWaveform Comment  +--------+------------------+-----+---------+--------+ [id number]                    triphasic         +--------+------------------+-----+---------+--------+ PTA     157               1.15 triphasic         +--------+------------------+-----+---------+--------+ DP      152               1.11 triphasic         +--------+------------------+-----+---------+--------+ +--------+------------------+-----+---------+-------+ Left    Lt Pressure (mmHg)IndexWaveform Comment +--------+------------------+-----+---------+-------+ [id number]                    triphasic        +--------+------------------+-----+---------+-------+ PTA     174               1.27 triphasic        +--------+------------------+-----+---------+-------+ DP      157               1.15 triphasic         +--------+------------------+-----+---------+-------+ +-------+---------------+----------------+ ABI/TBIToday's ABI/TBIPrevious ABI/TBI +-------+---------------+----------------+ Right  1.15                            +-------+---------------+----------------+ Left   1.27                            +-------+---------------+----------------+  Right Doppler Findings: +--------+--------+-----+---------+--------+ Site    PressureIndexDoppler  Comments +--------+--------+-----+---------+--------+ [id number]          triphasic         +--------+--------+-----+---------+--------+ Radial               triphasic         +--------+--------+-----+---------+--------+ Ulnar                triphasic         +--------+--------+-----+---------+--------+  Left Doppler Findings: +--------+--------+-----+---------+--------+ Site    PressureIndexDoppler  Comments +--------+--------+-----+---------+--------+ [id number]          triphasic         +--------+--------+-----+---------+--------+ Radial               triphasic         +--------+--------+-----+---------+--------+ Ulnar  triphasic         +--------+--------+-----+---------+--------+  Summary: Right Carotid: Velocities in the right ICA are consistent with a 1-39% stenosis. Left Carotid: Velocities in the left ICA are consistent with a 1-39% stenosis. Vertebrals: Bilateral vertebral arteries demonstrate antegrade flow. Right ABI: Resting right ankle-brachial index is within normal range. No evidence of significant right lower extremity arterial disease. Left ABI: Resting left ankle-brachial index is within normal range. No evidence of significant left lower extremity arterial disease. Right Upper Extremity: Unable to obtain due to tr band. Left Upper Extremity: Doppler waveforms remain within normal limits with left radial compression. Doppler waveforms decrease 50% with left ulnar compression.   Electronically signed by Servando Snare MD on 05/22/2018 at 5:31:06 PM.    Final    Cardiac Studies   Left Heart Cath 05/22/2018  Previously placed Prox LAD to Mid LAD stent (unknown type) is widely patent.  1st Mrg lesion is 70% stenosed.  Prox RCA to Mid RCA lesion is 75% stenosed.  Prox Cx lesion is 100% stenosed.  Ost LAD to Prox LAD lesion is 75% stenosed.  Mid LAD lesion is 50% stenosed.  LV end diastolic pressure is normal.  There is no aortic valve stenosis.  Patient Profile     Mr. Rieth is a 78 y.o.male with known CAD status post drug-eluting stent to the mid and proximal LAD in 2016 who presented to the emergency department with severe substernal chest pressure.  Assessment & Plan    Elevated Troponin  CAD s/p DES to the mid and proximal LAD in 2016 - Chest pain improved.  - S/p left heart cath that illustrated multi-vessel disease  - Continue heparin ggt  - Continue ASA and metoprolol tartrate 25 mg BID - Not on lisinopril due to angioedema  - CT surgery planning for CABG on 05/24/2018  CKD Stage II-III - Creatinine at baseline   Hypertension  - Continue Metoprolol Tartrate 25 mg BID   HLD - Continue pitavastatin - Will need to change to high intensity statin   Will discuss case further with Dr. Tamala Julian.   For questions or updates, please contact [place] Please consult [email or web address] for contact info under Cardiology/STEMI.   Signed, Ina Homes, MD  05/23/2018, 9:19 AM

## 2018-05-23 NOTE — Progress Notes (Signed)
Bilateral lower extremity vein mapping completed. Refer to "CV Proc" under chart review to view preliminary results.  05/23/2018 11:01 AM Maudry Mayhew, MHA, RVT, RDCS, RDMS

## 2018-05-23 NOTE — Progress Notes (Signed)
1 Day Post-Op Procedure(s) (LRB): LEFT HEART CATH AND CORONARY ANGIOGRAPHY (N/A) Subjective:  No chest pain or shortness of breath overnight. On heparin drip.  Objective: Vital signs in last 24 hours: Temp:  [97.5 F (36.4 C)-97.6 F (36.4 C)] 97.6 F (36.4 C) (02/20 0608) Pulse Rate:  [58-69] 60 (02/20 3790) Cardiac Rhythm: Normal sinus rhythm (02/20 0700) Resp:  [11-71] 16 (02/20 0608) BP: (138-173)/(72-94) 166/91 (02/20 0608) SpO2:  [97 %-100 %] 98 % (02/20 0608) Weight:  [92.2 kg] 92.2 kg (02/20 0608)  Hemodynamic parameters for last 24 hours:    Intake/Output from previous day: 02/19 0701 - 02/20 0700 In: 717.2 [I.V.:717.2] Out: 950 [Urine:950] Intake/Output this shift: No intake/output data recorded.  General appearance: alert and cooperative Heart: regular rate and rhythm, S1, S2 normal, no murmur, click, rub or gallop Lungs: clear to auscultation bilaterally  Lab Results: Recent Labs    05/22/18 0027 05/23/18 0110  WBC 3.8* 3.6*  HGB 13.0 12.7*  HCT 39.5 38.6*  PLT 143* 147*   BMET:  Recent Labs    05/22/18 0027 05/23/18 0110  NA 141 139  K 4.0 4.1  CL 109 108  CO2 26 25  GLUCOSE 139* 128*  BUN 19 16  CREATININE 1.38* 1.49*  CALCIUM 8.8* 8.9    PT/INR: No results for input(s): LABPROT, INR in the last 72 hours. ABG    Component Value Date/Time   PHART 7.481 (H) 07/28/2017 1629   HCO3 19.3 (L) 07/28/2017 1629   TCO2 20 (L) 07/28/2017 1629   ACIDBASEDEF 3.0 (H) 07/28/2017 1629   O2SAT 98.0 07/28/2017 1629   CBG (last 3)  No results for input(s): GLUCAP in the last 72 hours.  Assessment/Plan: S/P Procedure(s) (LRB): LEFT HEART CATH AND CORONARY ANGIOGRAPHY (N/A)  Severe 3 vessel CAD presenting with NSTEMI. Plan CABG tomorrow. Will use bilateral IMA and probably left radial artery. Still does not have red armband on left arm although I signed radial artery order set last night. Will order again. I think it is ok to leave the current IV in  left arm but do not start new IV or draw blood from that arm. GSV mapping to be done today. Patient has no further questions about surgery.   LOS: 2 days    Gaye Pollack 05/23/2018

## 2018-05-23 NOTE — Care Management Note (Signed)
Case Management Note  Patient Details  Name: Daniel Herrera MRN: [id number] Date of Birth: 07-27-1940  Subjective/Objective:  Pt presented for Left heat cath- revealed severe 3 vessel CAD. Plan for CABG 05-24-18. PTA Independent from home with spouse. Per wife they have [employer].                Action/Plan: CM will continue to monitor post procedure to see if he needs anything to transition home.   Expected Discharge Date:                  Expected Discharge Plan:  [place]  In-House Referral:  NA  Discharge planning Services  CM Consult  Post Acute Care Choice:    Choice offered to:     DME Arranged:    DME Agency:     HH Arranged:    HH Agency:     Status of Service:  In process, will continue to follow  If discussed at Long Length of Stay Meetings, dates discussed:    Additional Comments:  Bethena Roys, RN 05/23/2018, 10:36 AM

## 2018-05-23 NOTE — Progress Notes (Signed)
[place] for heparin Indication: 3V CAD, awaiting CABG  Allergies  Allergen Reactions  . Lisinopril Swelling    Facial and lip swelling. And throat swelling.   . Benadryl [Diphenhydramine Hcl] Swelling    Facial swelling from high dose (tolerates Advil PM as needed)  . Darvocet [Propoxyphene N-Acetaminophen] Other (See Comments)    hallucination  . Darvon [Propoxyphene Hcl] Other (See Comments)    Hallucination   . Tylenol [Acetaminophen] Swelling    Foot swelling    Patient Measurements: Height: 6\' 1"  (185.4 cm) Weight: 203 lb 4.8 oz (92.2 kg) IBW/kg (Calculated) : 79.9 Heparin Dosing Weight: 94.7 kg  Vital Signs: Temp: 97.6 F (36.4 C) (02/20 0608) Temp Source: Oral (02/20 0608) BP: 166/91 (02/20 0608) Pulse Rate: 60 (02/20 0608)  Labs: Recent Labs    05/20/18 2240 05/21/18 1058 05/21/18 1643  05/22/18 0027 05/23/18 0110 05/23/18 0925  HGB 14.6  --   --   --  13.0 12.7*  --   HCT 45.5  --   --   --  39.5 38.6*  --   PLT 142*  --   --   --  143* 147*  --   HEPARINUNFRC  --   --   --    < > 0.38 0.26* 0.50  CREATININE 1.59*  --   --   --  1.38* 1.49*  --   TROPONINI  --  0.59* 0.38*  --  0.25*  --   --    < > = values in this interval not displayed.    Estimated Creatinine Clearance: 45.4 mL/min (A) (by C-G formula based on SCr of 1.49 mg/dL (H)).  Assessment: 78 yo M presents with CP. He is now s/p cath with 3VCAD and for CABG consult. Heparin restarted post-cath. Plan for surgery Friday morning.  Heparin level therapeutic, CBC stable, CABG tomorrow.  Goal of Therapy:  Heparin level 0.3-0.7 units/ml Monitor platelets by anticoagulation protocol: Yes   Plan:  -Continue heparin 1400 units/h -Daily heparin level and CBC  Arrie Senate, PharmD, BCPS Clinical Pharmacist [phone] Please check AMION for all [place] numbers 05/23/2018

## 2018-05-23 NOTE — Progress Notes (Signed)
CARDIAC REHAB PHASE I   PRE:  Rate/Rhythm: 78 SR    BP: sitting 134/80    SaO2: 93 RA  MODE:  Ambulation: 400 ft   POST:  Rate/Rhythm: 88 SR    BP: sitting 152/82     SaO2: 96 RA  Tolerated well, felt good. Discussed sternal precautions, IS (2500 mL), mobility post op, and d/c planning. Voiced understanding and his wife will be with him at d/c. Gave resources and preop video. Pt very pleasant.  [id number]   [place], ACSM 05/23/2018 2:57 PM

## 2018-05-24 ENCOUNTER — Inpatient Hospital Stay (HOSPITAL_COMMUNITY): Payer: Medicare Other | Admitting: Certified Registered"

## 2018-05-24 ENCOUNTER — Inpatient Hospital Stay (HOSPITAL_COMMUNITY)
Admission: EM | Disposition: A | Discharge status: Home Health | Payer: Self-pay | Source: Home / Self Care | Attending: Interventional Cardiology

## 2018-05-24 ENCOUNTER — Inpatient Hospital Stay (HOSPITAL_COMMUNITY): Payer: Medicare Other

## 2018-05-24 HISTORY — PX: TEE WITHOUT CARDIOVERSION: SHX5443

## 2018-05-24 HISTORY — PX: CORONARY ARTERY BYPASS GRAFT: SHX141

## 2018-05-24 HISTORY — PX: RADIAL ARTERY HARVEST: SHX5067

## 2018-05-24 LAB — POCT I-STAT 4, (NA,K, GLUC, HGB,HCT)
Glucose, Bld: 115 mg/dL — ABNORMAL HIGH (ref 70–99)
Glucose, Bld: 109 mg/dL — ABNORMAL HIGH (ref 70–99)
Glucose, Bld: 117 mg/dL — ABNORMAL HIGH (ref 70–99)
Glucose, Bld: 131 mg/dL — ABNORMAL HIGH (ref 70–99)
Glucose, Bld: 112 mg/dL — ABNORMAL HIGH (ref 70–99)
Glucose, Bld: 194 mg/dL — ABNORMAL HIGH (ref 70–99)
Glucose, Bld: 178 mg/dL — ABNORMAL HIGH (ref 70–99)
HCT: 35 % — ABNORMAL LOW (ref 39.0–52.0)
HCT: 33 % — ABNORMAL LOW (ref 39.0–52.0)
HCT: 34 % — ABNORMAL LOW (ref 39.0–52.0)
HCT: 34 % — ABNORMAL LOW (ref 39.0–52.0)
HCT: 29 % — ABNORMAL LOW (ref 39.0–52.0)
HCT: 28 % — ABNORMAL LOW (ref 39.0–52.0)
HCT: 33 % — ABNORMAL LOW (ref 39.0–52.0)
HEMOGLOBIN: 11.6 g/dL — AB (ref 13.0–17.0)
Hemoglobin: 11.9 g/dL — ABNORMAL LOW (ref 13.0–17.0)
Hemoglobin: 11.2 g/dL — ABNORMAL LOW (ref 13.0–17.0)
Hemoglobin: 11.6 g/dL — ABNORMAL LOW (ref 13.0–17.0)
Hemoglobin: 9.9 g/dL — ABNORMAL LOW (ref 13.0–17.0)
Hemoglobin: 9.5 g/dL — ABNORMAL LOW (ref 13.0–17.0)
Hemoglobin: 11.2 g/dL — ABNORMAL LOW (ref 13.0–17.0)
POTASSIUM: 3.9 mmol/L (ref 3.5–5.1)
Potassium: 4.2 mmol/L (ref 3.5–5.1)
Potassium: 4.4 mmol/L (ref 3.5–5.1)
Potassium: 4.5 mmol/L (ref 3.5–5.1)
Potassium: 4.9 mmol/L (ref 3.5–5.1)
Potassium: 5.8 mmol/L — ABNORMAL HIGH (ref 3.5–5.1)
Potassium: 4.8 mmol/L (ref 3.5–5.1)
SODIUM: 141 mmol/L (ref 135–145)
SODIUM: 135 mmol/L (ref 135–145)
Sodium: 140 mmol/L (ref 135–145)
Sodium: 139 mmol/L (ref 135–145)
Sodium: 140 mmol/L (ref 135–145)
Sodium: 138 mmol/L (ref 135–145)
Sodium: 138 mmol/L (ref 135–145)

## 2018-05-24 LAB — POCT I-STAT 7, (LYTES, BLD GAS, ICA,H+H)
Acid-base deficit: 2 mmol/L (ref 0.0–2.0)
Acid-base deficit: 4 mmol/L — ABNORMAL HIGH (ref 0.0–2.0)
Acid-base deficit: 2 mmol/L (ref 0.0–2.0)
Acid-base deficit: 3 mmol/L — ABNORMAL HIGH (ref 0.0–2.0)
Bicarbonate: 26.8 mmol/L (ref 20.0–28.0)
Bicarbonate: 22.4 mmol/L (ref 20.0–28.0)
Bicarbonate: 21.3 mmol/L (ref 20.0–28.0)
Bicarbonate: 22.5 mmol/L (ref 20.0–28.0)
Bicarbonate: 22 mmol/L (ref 20.0–28.0)
Calcium, Ion: 0.95 mmol/L — ABNORMAL LOW (ref 1.15–1.40)
Calcium, Ion: 1.1 mmol/L — ABNORMAL LOW (ref 1.15–1.40)
Calcium, Ion: 1.2 mmol/L (ref 1.15–1.40)
Calcium, Ion: 1.21 mmol/L (ref 1.15–1.40)
Calcium, Ion: 1.2 mmol/L (ref 1.15–1.40)
HCT: 24 % — ABNORMAL LOW (ref 39.0–52.0)
HCT: 29 % — ABNORMAL LOW (ref 39.0–52.0)
HCT: 33 % — ABNORMAL LOW (ref 39.0–52.0)
HCT: 32 % — ABNORMAL LOW (ref 39.0–52.0)
HCT: 32 % — ABNORMAL LOW (ref 39.0–52.0)
HEMOGLOBIN: 8.2 g/dL — AB (ref 13.0–17.0)
HEMOGLOBIN: 11.2 g/dL — AB (ref 13.0–17.0)
Hemoglobin: 9.9 g/dL — ABNORMAL LOW (ref 13.0–17.0)
Hemoglobin: 10.9 g/dL — ABNORMAL LOW (ref 13.0–17.0)
Hemoglobin: 10.9 g/dL — ABNORMAL LOW (ref 13.0–17.0)
O2 SAT: 100 %
O2 Saturation: 100 %
O2 Saturation: 99 %
O2 Saturation: 98 %
O2 Saturation: 99 %
PO2 ART: 395 mmHg — AB (ref 83.0–108.0)
PO2 ART: 101 mmHg (ref 83.0–108.0)
POTASSIUM: 3.9 mmol/L (ref 3.5–5.1)
Patient temperature: 35.9
Patient temperature: 37.1
Potassium: 4.9 mmol/L (ref 3.5–5.1)
Potassium: 4.8 mmol/L (ref 3.5–5.1)
Potassium: 4.7 mmol/L (ref 3.5–5.1)
Potassium: 4.7 mmol/L (ref 3.5–5.1)
SODIUM: 138 mmol/L (ref 135–145)
SODIUM: 140 mmol/L (ref 135–145)
Sodium: 138 mmol/L (ref 135–145)
Sodium: 140 mmol/L (ref 135–145)
Sodium: 139 mmol/L (ref 135–145)
TCO2: 28 mmol/L (ref 22–32)
TCO2: 24 mmol/L (ref 22–32)
TCO2: 22 mmol/L (ref 22–32)
TCO2: 24 mmol/L (ref 22–32)
TCO2: 23 mmol/L (ref 22–32)
pCO2 arterial: 53.7 mmHg — ABNORMAL HIGH (ref 32.0–48.0)
pCO2 arterial: 37.7 mmHg (ref 32.0–48.0)
pCO2 arterial: 38.1 mmHg (ref 32.0–48.0)
pCO2 arterial: 38.7 mmHg (ref 32.0–48.0)
pCO2 arterial: 38 mmHg (ref 32.0–48.0)
pH, Arterial: 7.307 — ABNORMAL LOW (ref 7.350–7.450)
pH, Arterial: 7.383 (ref 7.350–7.450)
pH, Arterial: 7.35 (ref 7.350–7.450)
pH, Arterial: 7.373 (ref 7.350–7.450)
pH, Arterial: 7.37 (ref 7.350–7.450)
pO2, Arterial: 176 mmHg — ABNORMAL HIGH (ref 83.0–108.0)
pO2, Arterial: 125 mmHg — ABNORMAL HIGH (ref 83.0–108.0)
pO2, Arterial: 122 mmHg — ABNORMAL HIGH (ref 83.0–108.0)

## 2018-05-24 LAB — CBC
HCT: 34.9 % — ABNORMAL LOW (ref 39.0–52.0)
HCT: 34.9 % — ABNORMAL LOW (ref 39.0–52.0)
HEMATOCRIT: 43.1 % (ref 39.0–52.0)
Hemoglobin: 14.8 g/dL (ref 13.0–17.0)
Hemoglobin: 11.8 g/dL — ABNORMAL LOW (ref 13.0–17.0)
Hemoglobin: 11.6 g/dL — ABNORMAL LOW (ref 13.0–17.0)
MCH: 31.2 pg (ref 26.0–34.0)
MCH: 30.9 pg (ref 26.0–34.0)
MCH: 30.1 pg (ref 26.0–34.0)
MCHC: 34.3 g/dL (ref 30.0–36.0)
MCHC: 33.8 g/dL (ref 30.0–36.0)
MCHC: 33.2 g/dL (ref 30.0–36.0)
MCV: 90.9 fL (ref 80.0–100.0)
MCV: 91.4 fL (ref 80.0–100.0)
MCV: 90.6 fL (ref 80.0–100.0)
NRBC: 0 % (ref 0.0–0.2)
Platelets: 181 10*3/uL (ref 150–400)
Platelets: 143 10*3/uL — ABNORMAL LOW (ref 150–400)
Platelets: 139 10*3/uL — ABNORMAL LOW (ref 150–400)
RBC: 4.74 MIL/uL (ref 4.22–5.81)
RBC: 3.82 MIL/uL — ABNORMAL LOW (ref 4.22–5.81)
RBC: 3.85 MIL/uL — ABNORMAL LOW (ref 4.22–5.81)
RDW: 13 % (ref 11.5–15.5)
RDW: 12.9 % (ref 11.5–15.5)
RDW: 13 % (ref 11.5–15.5)
WBC: 4.8 10*3/uL (ref 4.0–10.5)
WBC: 9.3 10*3/uL (ref 4.0–10.5)
WBC: 9 10*3/uL (ref 4.0–10.5)
nRBC: 0 % (ref 0.0–0.2)
nRBC: 0 % (ref 0.0–0.2)

## 2018-05-24 LAB — GLUCOSE, CAPILLARY
GLUCOSE-CAPILLARY: 109 mg/dL — AB (ref 70–99)
Glucose-Capillary: 120 mg/dL — ABNORMAL HIGH (ref 70–99)
Glucose-Capillary: 123 mg/dL — ABNORMAL HIGH (ref 70–99)
Glucose-Capillary: 113 mg/dL — ABNORMAL HIGH (ref 70–99)
Glucose-Capillary: 121 mg/dL — ABNORMAL HIGH (ref 70–99)
Glucose-Capillary: 120 mg/dL — ABNORMAL HIGH (ref 70–99)
Glucose-Capillary: 160 mg/dL — ABNORMAL HIGH (ref 70–99)

## 2018-05-24 LAB — ECHO INTRAOPERATIVE TEE
Height: 73 in
Weight: 3212.8 oz

## 2018-05-24 LAB — CREATININE, SERUM
CREATININE: 1.36 mg/dL — AB (ref 0.61–1.24)
GFR calc Af Amer: 57 mL/min — ABNORMAL LOW (ref >60)
GFR calc non Af Amer: 49 mL/min — ABNORMAL LOW (ref >60)

## 2018-05-24 LAB — BASIC METABOLIC PANEL
Anion gap: 11 (ref 5–15)
BUN: 16 mg/dL (ref 8–23)
CO2: 24 mmol/L (ref 22–32)
Calcium: 9.6 mg/dL (ref 8.9–10.3)
Chloride: 105 mmol/L (ref 98–111)
Creatinine, Ser: 1.52 mg/dL — ABNORMAL HIGH (ref 0.61–1.24)
GFR calc non Af Amer: 43 mL/min — ABNORMAL LOW (ref >60)
GFR, EST AFRICAN AMERICAN: 50 mL/min — AB (ref >60)
Glucose, Bld: 138 mg/dL — ABNORMAL HIGH (ref 70–99)
Potassium: 4.2 mmol/L (ref 3.5–5.1)
Sodium: 140 mmol/L (ref 135–145)

## 2018-05-24 LAB — MAGNESIUM: MAGNESIUM: 2.9 mg/dL — AB (ref 1.7–2.4)

## 2018-05-24 LAB — PROTIME-INR
INR: 1.36
PROTHROMBIN TIME: 16.6 s — AB (ref 11.4–15.2)

## 2018-05-24 LAB — PLATELET COUNT: Platelets: 137 K/uL — ABNORMAL LOW (ref 150–400)

## 2018-05-24 LAB — HEPARIN LEVEL (UNFRACTIONATED): Heparin Unfractionated: 0.8 IU/mL — ABNORMAL HIGH (ref 0.30–0.70)

## 2018-05-24 LAB — APTT: aPTT: 28 seconds (ref 24–36)

## 2018-05-24 SURGERY — CORONARY ARTERY BYPASS GRAFTING (CABG)
Anesthesia: General | Site: Chest

## 2018-05-24 MED ORDER — LACTATED RINGERS IV SOLN
INTRAVENOUS | Status: DC
  Administered 2018-05-24: 15:00:00 via INTRAVENOUS

## 2018-05-24 MED ORDER — MIDAZOLAM HCL (PF) 10 MG/2ML IJ SOLN
INTRAMUSCULAR | Status: AC
  Filled 2018-05-24: qty 2

## 2018-05-24 MED ORDER — PROPOFOL 10 MG/ML IV BOLUS
INTRAVENOUS | Status: AC
  Filled 2018-05-24: qty 20

## 2018-05-24 MED ORDER — NITROGLYCERIN 0.2 MG/ML ON CALL CATH LAB
INTRAVENOUS | Status: DC | PRN
  Administered 2018-05-24 (×2): 40 ug via INTRAVENOUS

## 2018-05-24 MED ORDER — DEXAMETHASONE SODIUM PHOSPHATE 10 MG/ML IJ SOLN
INTRAMUSCULAR | Status: DC | PRN
  Administered 2018-05-24: 10 mg via INTRAVENOUS

## 2018-05-24 MED ORDER — ARTIFICIAL TEARS OPHTHALMIC OINT
TOPICAL_OINTMENT | OPHTHALMIC | Status: DC | PRN
  Administered 2018-05-24: 1 via OPHTHALMIC

## 2018-05-24 MED ORDER — ONDANSETRON HCL 4 MG/2ML IJ SOLN
INTRAMUSCULAR | Status: AC
  Filled 2018-05-24: qty 2

## 2018-05-24 MED ORDER — MIDAZOLAM HCL 2 MG/2ML IJ SOLN: 2.0000 mg | INTRAMUSCULAR | Status: DC | PRN

## 2018-05-24 MED ORDER — HEPARIN SODIUM (PORCINE) 1000 UNIT/ML IJ SOLN
INTRAMUSCULAR | Status: AC
  Filled 2018-05-24: qty 1

## 2018-05-24 MED ORDER — ASPIRIN EC 325 MG PO TBEC
325.0000 mg | DELAYED_RELEASE_TABLET | Freq: Every day | ORAL | Status: DC
  Administered 2018-05-25 – 2018-05-28 (×3): 325 mg via ORAL
  Filled 2018-05-24 (×4): qty 1

## 2018-05-24 MED ORDER — SODIUM CHLORIDE 0.9 % IV SOLN
INTRAVENOUS | Status: DC | PRN
  Administered 2018-05-24: 750 mg via INTRAVENOUS

## 2018-05-24 MED ORDER — SODIUM CHLORIDE 0.9 % IV SOLN
INTRAVENOUS | Status: DC | PRN
  Administered 2018-05-24: 25 ug/min via INTRAVENOUS

## 2018-05-24 MED ORDER — METOCLOPRAMIDE HCL 5 MG/ML IJ SOLN
10.0000 mg | Freq: Four times a day (QID) | INTRAMUSCULAR | Status: AC
  Administered 2018-05-24 – 2018-05-25 (×4): 10 mg via INTRAVENOUS
  Filled 2018-05-24 (×3): qty 2

## 2018-05-24 MED ORDER — LACTATED RINGERS IV SOLN
INTRAVENOUS | Status: DC | PRN
  Administered 2018-05-24: 07:00:00 via INTRAVENOUS

## 2018-05-24 MED ORDER — BISACODYL 10 MG RE SUPP: 10.0000 mg | Freq: Every day | RECTAL | Status: DC

## 2018-05-24 MED ORDER — ROCURONIUM BROMIDE 50 MG/5ML IV SOSY
PREFILLED_SYRINGE | INTRAVENOUS | Status: AC
  Filled 2018-05-24: qty 20

## 2018-05-24 MED ORDER — ROCURONIUM BROMIDE 50 MG/5ML IV SOSY
PREFILLED_SYRINGE | INTRAVENOUS | Status: DC | PRN
  Administered 2018-05-24 (×2): 50 mg via INTRAVENOUS
  Administered 2018-05-24: 100 mg via INTRAVENOUS

## 2018-05-24 MED ORDER — BACITRACIN ZINC 500 UNIT/GM EX OINT
TOPICAL_OINTMENT | CUTANEOUS | Status: DC | PRN
  Administered 2018-05-24: 1 via TOPICAL

## 2018-05-24 MED ORDER — HOME MED STORE IN PYXIS
1.0000 | Status: DC | PRN
  Filled 2018-05-24: qty 1

## 2018-05-24 MED ORDER — DOPAMINE-DEXTROSE 3.2-5 MG/ML-% IV SOLN: 0.0000 ug/kg/min | INTRAVENOUS | Status: DC

## 2018-05-24 MED ORDER — CHLORHEXIDINE GLUCONATE 0.12 % MT SOLN: 15.0000 mL | Freq: Two times a day (BID) | OROMUCOSAL | Status: DC

## 2018-05-24 MED ORDER — SODIUM CHLORIDE 0.9 % IV SOLN: 250.0000 mL | INTRAVENOUS | Status: DC

## 2018-05-24 MED ORDER — INSULIN REGULAR BOLUS VIA INFUSION
0.0000 [IU] | Freq: Three times a day (TID) | INTRAVENOUS | Status: DC
  Filled 2018-05-24: qty 10

## 2018-05-24 MED ORDER — SODIUM CHLORIDE 0.9% FLUSH
3.0000 mL | Freq: Two times a day (BID) | INTRAVENOUS | Status: DC
  Administered 2018-05-25 – 2018-05-28 (×5): 3 mL via INTRAVENOUS

## 2018-05-24 MED ORDER — LACTATED RINGERS IV SOLN: 500.0000 mL | Freq: Once | INTRAVENOUS | Status: DC | PRN

## 2018-05-24 MED ORDER — HEMOSTATIC AGENTS (NO CHARGE) OPTIME
TOPICAL | Status: DC | PRN
  Administered 2018-05-24: 1 via TOPICAL

## 2018-05-24 MED ORDER — SODIUM CHLORIDE 0.45 % IV SOLN
INTRAVENOUS | Status: DC | PRN
  Administered 2018-05-24: 15:00:00 via INTRAVENOUS

## 2018-05-24 MED ORDER — DEXTROSE 50 % IV SOLN
INTRAVENOUS | Status: AC
  Filled 2018-05-24: qty 50

## 2018-05-24 MED ORDER — PROPOFOL 10 MG/ML IV BOLUS
INTRAVENOUS | Status: DC | PRN
  Administered 2018-05-24: 100 mg via INTRAVENOUS
  Administered 2018-05-24: 70 mg via INTRAVENOUS

## 2018-05-24 MED ORDER — DEXMEDETOMIDINE HCL IN NACL 200 MCG/50ML IV SOLN
0.0000 ug/kg/h | INTRAVENOUS | Status: DC
  Administered 2018-05-24: 0.3 ug/kg/h via INTRAVENOUS
  Filled 2018-05-24: qty 50

## 2018-05-24 MED ORDER — BISACODYL 5 MG PO TBEC
10.0000 mg | DELAYED_RELEASE_TABLET | Freq: Every day | ORAL | Status: DC
  Administered 2018-05-25 – 2018-05-28 (×4): 10 mg via ORAL
  Filled 2018-05-24 (×4): qty 2

## 2018-05-24 MED ORDER — MIDAZOLAM HCL 5 MG/5ML IJ SOLN
INTRAMUSCULAR | Status: DC | PRN
  Administered 2018-05-24: 2 mg via INTRAVENOUS
  Administered 2018-05-24: 1 mg via INTRAVENOUS
  Administered 2018-05-24: 3 mg via INTRAVENOUS
  Administered 2018-05-24: 2 mg via INTRAVENOUS

## 2018-05-24 MED ORDER — LACTATED RINGERS IV SOLN
INTRAVENOUS | Status: DC | PRN
  Administered 2018-05-24 (×2): via INTRAVENOUS

## 2018-05-24 MED ORDER — HEPARIN SODIUM (PORCINE) 1000 UNIT/ML IJ SOLN
INTRAMUSCULAR | Status: DC | PRN
  Administered 2018-05-24: 29000 [IU] via INTRAVENOUS
  Administered 2018-05-24: 3000 [IU] via INTRAVENOUS

## 2018-05-24 MED ORDER — PHENYLEPHRINE HCL-NACL 20-0.9 MG/250ML-% IV SOLN: 0.0000 ug/min | INTRAVENOUS | Status: DC

## 2018-05-24 MED ORDER — ORAL CARE MOUTH RINSE
15.0000 mL | Freq: Two times a day (BID) | OROMUCOSAL | Status: DC
  Administered 2018-05-24: 15 mL via OROMUCOSAL

## 2018-05-24 MED ORDER — ACETAMINOPHEN 650 MG RE SUPP
650.0000 mg | Freq: Once | RECTAL | Status: AC
  Administered 2018-05-24: 650 mg via RECTAL

## 2018-05-24 MED ORDER — SODIUM CHLORIDE (PF) 0.9 % IJ SOLN
INTRAMUSCULAR | Status: AC
  Filled 2018-05-24: qty 10

## 2018-05-24 MED ORDER — ACETAMINOPHEN 160 MG/5ML PO SOLN: 650.0000 mg | Freq: Once | ORAL | Status: AC

## 2018-05-24 MED ORDER — SODIUM CHLORIDE 0.9 % IV SOLN
INTRAVENOUS | Status: DC | PRN
  Administered 2018-05-24: 14:00:00 via INTRAVENOUS

## 2018-05-24 MED ORDER — ONDANSETRON HCL 4 MG/2ML IJ SOLN
INTRAMUSCULAR | Status: DC | PRN
  Administered 2018-05-24: 4 mg via INTRAVENOUS

## 2018-05-24 MED ORDER — ASPIRIN 81 MG PO CHEW
324.0000 mg | CHEWABLE_TABLET | Freq: Every day | ORAL | Status: DC
  Administered 2018-05-27: 324 mg
  Filled 2018-05-24: qty 4

## 2018-05-24 MED ORDER — POTASSIUM CHLORIDE 10 MEQ/50ML IV SOLN
10.0000 meq | INTRAVENOUS | Status: AC
  Administered 2018-05-24 (×3): 10 meq via INTRAVENOUS

## 2018-05-24 MED ORDER — SODIUM BICARBONATE 8.4 % IV SOLN
50.0000 meq | Freq: Once | INTRAVENOUS | Status: AC
  Administered 2018-05-24: 50 meq via INTRAVENOUS

## 2018-05-24 MED ORDER — SODIUM CHLORIDE 0.9 % IV SOLN
INTRAVENOUS | Status: DC
  Administered 2018-05-24: 15:00:00 via INTRAVENOUS

## 2018-05-24 MED ORDER — DEXAMETHASONE SODIUM PHOSPHATE 10 MG/ML IJ SOLN
INTRAMUSCULAR | Status: AC
  Filled 2018-05-24: qty 1

## 2018-05-24 MED ORDER — TRAMADOL HCL 50 MG PO TABS
50.0000 mg | ORAL_TABLET | ORAL | Status: DC | PRN
  Administered 2018-05-25: 100 mg via ORAL
  Filled 2018-05-24: qty 2

## 2018-05-24 MED ORDER — PANTOPRAZOLE SODIUM 40 MG PO TBEC
40.0000 mg | DELAYED_RELEASE_TABLET | Freq: Every day | ORAL | Status: DC
  Administered 2018-05-26 – 2018-05-28 (×3): 40 mg via ORAL
  Filled 2018-05-24 (×3): qty 1

## 2018-05-24 MED ORDER — LACTATED RINGERS IV SOLN
INTRAVENOUS | Status: DC
  Administered 2018-05-24: 19:00:00 via INTRAVENOUS

## 2018-05-24 MED ORDER — VANCOMYCIN HCL IN DEXTROSE 1-5 GM/200ML-% IV SOLN
1000.0000 mg | Freq: Once | INTRAVENOUS | Status: AC
  Administered 2018-05-24: 1000 mg via INTRAVENOUS

## 2018-05-24 MED ORDER — PHENYLEPHRINE 40 MCG/ML (10ML) SYRINGE FOR IV PUSH (FOR BLOOD PRESSURE SUPPORT)
PREFILLED_SYRINGE | INTRAVENOUS | Status: AC
  Filled 2018-05-24: qty 10

## 2018-05-24 MED ORDER — PHENYLEPHRINE 40 MCG/ML (10ML) SYRINGE FOR IV PUSH (FOR BLOOD PRESSURE SUPPORT)
PREFILLED_SYRINGE | INTRAVENOUS | Status: DC | PRN
  Administered 2018-05-24: 80 ug via INTRAVENOUS
  Administered 2018-05-24: 60 ug via INTRAVENOUS
  Administered 2018-05-24: 40 ug via INTRAVENOUS
  Administered 2018-05-24: 80 ug via INTRAVENOUS
  Administered 2018-05-24: 40 ug via INTRAVENOUS

## 2018-05-24 MED ORDER — INSULIN REGULAR(HUMAN) IN NACL 100-0.9 UT/100ML-% IV SOLN: INTRAVENOUS | Status: DC

## 2018-05-24 MED ORDER — CHLORHEXIDINE GLUCONATE 0.12 % MT SOLN
15.0000 mL | OROMUCOSAL | Status: AC
  Administered 2018-05-24: 15 mL via OROMUCOSAL

## 2018-05-24 MED ORDER — THROMBIN (RECOMBINANT) 20000 UNITS EX SOLR
CUTANEOUS | Status: AC
  Filled 2018-05-24: qty 20000

## 2018-05-24 MED ORDER — OXYCODONE HCL 5 MG PO TABS: 5.0000 mg | ORAL_TABLET | ORAL | Status: DC | PRN

## 2018-05-24 MED ORDER — SODIUM CHLORIDE 0.9% FLUSH: 3.0000 mL | INTRAVENOUS | Status: DC | PRN

## 2018-05-24 MED ORDER — METOPROLOL TARTRATE 25 MG/10 ML ORAL SUSPENSION: 12.5000 mg | Freq: Two times a day (BID) | ORAL | Status: DC

## 2018-05-24 MED ORDER — ICATIBANT ACETATE 30 MG/3ML ~~LOC~~ SOLN
30.0000 mg | SUBCUTANEOUS | Status: DC | PRN
  Administered 2018-05-24 – 2018-05-28 (×4): 30 mg via SUBCUTANEOUS

## 2018-05-24 MED ORDER — THROMBIN 20000 UNITS EX SOLR
OROMUCOSAL | Status: DC | PRN
  Administered 2018-05-24 (×3): 4 mL via TOPICAL

## 2018-05-24 MED ORDER — ACETAMINOPHEN 500 MG PO TABS: 1000.0000 mg | ORAL_TABLET | Freq: Four times a day (QID) | ORAL | Status: DC

## 2018-05-24 MED ORDER — PROTAMINE SULFATE 10 MG/ML IV SOLN
INTRAVENOUS | Status: DC | PRN
  Administered 2018-05-24: 100 mg via INTRAVENOUS
  Administered 2018-05-24 (×2): 50 mg via INTRAVENOUS
  Administered 2018-05-24: 120 mg via INTRAVENOUS

## 2018-05-24 MED ORDER — THROMBIN 20000 UNITS EX SOLR
CUTANEOUS | Status: DC | PRN
  Administered 2018-05-24: 20000 [IU] via TOPICAL

## 2018-05-24 MED ORDER — FENTANYL CITRATE (PF) 250 MCG/5ML IJ SOLN
INTRAMUSCULAR | Status: DC | PRN
  Administered 2018-05-24: 150 ug via INTRAVENOUS
  Administered 2018-05-24 (×2): 100 ug via INTRAVENOUS
  Administered 2018-05-24: 250 ug via INTRAVENOUS
  Administered 2018-05-24: 50 ug via INTRAVENOUS
  Administered 2018-05-24: 100 ug via INTRAVENOUS
  Administered 2018-05-24: 200 ug via INTRAVENOUS
  Administered 2018-05-24 (×3): 100 ug via INTRAVENOUS

## 2018-05-24 MED ORDER — ORAL CARE MOUTH RINSE
15.0000 mL | Freq: Two times a day (BID) | OROMUCOSAL | Status: DC
  Administered 2018-05-25 – 2018-05-27 (×3): 15 mL via OROMUCOSAL

## 2018-05-24 MED ORDER — MORPHINE SULFATE (PF) 2 MG/ML IV SOLN
1.0000 mg | INTRAVENOUS | Status: DC | PRN
  Administered 2018-05-24: 2 mg via INTRAVENOUS
  Administered 2018-05-24 – 2018-05-25 (×5): 4 mg via INTRAVENOUS
  Administered 2018-05-25 – 2018-05-27 (×11): 2 mg via INTRAVENOUS
  Filled 2018-05-24 (×3): qty 1
  Filled 2018-05-24: qty 2
  Filled 2018-05-24 (×3): qty 1
  Filled 2018-05-24: qty 2
  Filled 2018-05-24 (×6): qty 1
  Filled 2018-05-24 (×3): qty 2

## 2018-05-24 MED ORDER — 0.9 % SODIUM CHLORIDE (POUR BTL) OPTIME
TOPICAL | Status: DC | PRN
  Administered 2018-05-24: 5000 mL

## 2018-05-24 MED ORDER — ALBUMIN HUMAN 5 % IV SOLN
INTRAVENOUS | Status: DC | PRN
  Administered 2018-05-24: 13:00:00 via INTRAVENOUS

## 2018-05-24 MED ORDER — MAGNESIUM SULFATE 4 GM/100ML IV SOLN
4.0000 g | Freq: Once | INTRAVENOUS | Status: AC
  Administered 2018-05-24: 4 g via INTRAVENOUS
  Filled 2018-05-24: qty 100

## 2018-05-24 MED ORDER — PROTAMINE SULFATE 10 MG/ML IV SOLN
INTRAVENOUS | Status: AC
  Filled 2018-05-24: qty 5

## 2018-05-24 MED ORDER — DOUBLE ANTIBIOTIC 500-10000 UNIT/GM EX OINT
TOPICAL_OINTMENT | CUTANEOUS | Status: AC
  Filled 2018-05-24: qty 1

## 2018-05-24 MED ORDER — ALBUMIN HUMAN 5 % IV SOLN
250.0000 mL | INTRAVENOUS | Status: DC | PRN
  Administered 2018-05-24 (×2): 12.5 g via INTRAVENOUS

## 2018-05-24 MED ORDER — ONDANSETRON HCL 4 MG/2ML IJ SOLN: 4.0000 mg | Freq: Four times a day (QID) | INTRAMUSCULAR | Status: DC | PRN

## 2018-05-24 MED ORDER — FENTANYL CITRATE (PF) 250 MCG/5ML IJ SOLN
INTRAMUSCULAR | Status: AC
  Filled 2018-05-24: qty 25

## 2018-05-24 MED ORDER — NITROGLYCERIN IN D5W 200-5 MCG/ML-% IV SOLN: 0.0000 ug/min | INTRAVENOUS | Status: DC

## 2018-05-24 MED ORDER — PROTAMINE SULFATE 10 MG/ML IV SOLN
INTRAVENOUS | Status: AC
  Filled 2018-05-24: qty 25

## 2018-05-24 MED ORDER — ACETAMINOPHEN 160 MG/5ML PO SOLN: 1000.0000 mg | Freq: Four times a day (QID) | ORAL | Status: DC

## 2018-05-24 MED ORDER — BACITRACIN ZINC 500 UNIT/GM EX OINT
TOPICAL_OINTMENT | CUTANEOUS | Status: AC
  Filled 2018-05-24: qty 28.35

## 2018-05-24 MED ORDER — METOPROLOL TARTRATE 5 MG/5ML IV SOLN
2.5000 mg | INTRAVENOUS | Status: DC | PRN
  Administered 2018-05-26: 5 mg via INTRAVENOUS
  Filled 2018-05-24: qty 5

## 2018-05-24 MED ORDER — METOPROLOL TARTRATE 12.5 MG HALF TABLET
12.5000 mg | ORAL_TABLET | Freq: Two times a day (BID) | ORAL | Status: DC
  Administered 2018-05-25 (×2): 12.5 mg via ORAL
  Filled 2018-05-24 (×2): qty 1

## 2018-05-24 MED ORDER — FAMOTIDINE IN NACL 20-0.9 MG/50ML-% IV SOLN
20.0000 mg | Freq: Two times a day (BID) | INTRAVENOUS | Status: AC
  Administered 2018-05-24: 20 mg via INTRAVENOUS

## 2018-05-24 MED ORDER — DOCUSATE SODIUM 100 MG PO CAPS
200.0000 mg | ORAL_CAPSULE | Freq: Every day | ORAL | Status: DC
  Administered 2018-05-25 – 2018-05-28 (×4): 200 mg via ORAL
  Filled 2018-05-24 (×4): qty 2

## 2018-05-24 MED ORDER — SODIUM CHLORIDE 0.9 % IV SOLN
1.5000 g | Freq: Two times a day (BID) | INTRAVENOUS | Status: AC
  Administered 2018-05-24 – 2018-05-26 (×4): 1.5 g via INTRAVENOUS
  Filled 2018-05-24 (×4): qty 1.5

## 2018-05-24 SURGICAL SUPPLY — 98 items
BAG DECANTER FOR FLEXI CONT (MISCELLANEOUS) ×5 IMPLANT
BANDAGE ACE 4X5 VEL STRL LF (GAUZE/BANDAGES/DRESSINGS) ×5 IMPLANT
BANDAGE ACE 6X5 VEL STRL LF (GAUZE/BANDAGES/DRESSINGS) ×5 IMPLANT
BASKET HEART  (ORDER IN 25'S) (MISCELLANEOUS) ×1
BASKET HEART (ORDER IN 25'S) (MISCELLANEOUS) ×1
BASKET HEART (ORDER IN 25S) (MISCELLANEOUS) ×3 IMPLANT
BLADE STERNUM SYSTEM 6 (BLADE) ×5 IMPLANT
BLADE SURG 11 STRL SS (BLADE) ×5 IMPLANT
BLADE SURG 15 STRL LF DISP TIS (BLADE) ×3 IMPLANT
BLADE SURG 15 STRL SS (BLADE) ×2
BNDG GAUZE ELAST 4 BULKY (GAUZE/BANDAGES/DRESSINGS) ×5 IMPLANT
CANISTER SUCT 3000ML PPV (MISCELLANEOUS) ×5 IMPLANT
CATH ROBINSON RED A/P 18FR (CATHETERS) ×10 IMPLANT
CATH THORACIC 28FR (CATHETERS) ×5 IMPLANT
CATH THORACIC 36FR (CATHETERS) ×5 IMPLANT
CATH THORACIC 36FR RT ANG (CATHETERS) ×5 IMPLANT
CLIP VESOCCLUDE MED 24/CT (CLIP) IMPLANT
CLIP VESOCCLUDE SM WIDE 24/CT (CLIP) ×20 IMPLANT
COVER MAYO STAND STRL (DRAPES) ×5 IMPLANT
COVER SURGICAL LIGHT HANDLE (MISCELLANEOUS) ×5 IMPLANT
COVER WAND RF STERILE (DRAPES) ×5 IMPLANT
CRADLE DONUT ADULT HEAD (MISCELLANEOUS) ×5 IMPLANT
DRAPE CARDIOVASCULAR INCISE (DRAPES) ×2
DRAPE EXTREMITY T 121X128X90 (DISPOSABLE) ×5 IMPLANT
DRAPE HALF SHEET 40X57 (DRAPES) ×10 IMPLANT
DRAPE SLUSH/WARMER DISC (DRAPES) ×5 IMPLANT
DRAPE SRG 135X102X78XABS (DRAPES) ×3 IMPLANT
DRSG COVADERM 4X14 (GAUZE/BANDAGES/DRESSINGS) ×5 IMPLANT
ELECT CAUTERY BLADE 6.4 (BLADE) ×5 IMPLANT
ELECT REM PT RETURN 9FT ADLT (ELECTROSURGICAL) ×10
ELECTRODE REM PT RTRN 9FT ADLT (ELECTROSURGICAL) ×6 IMPLANT
FELT TEFLON 1X6 (MISCELLANEOUS) ×10 IMPLANT
GAUZE SPONGE 4X4 12PLY STRL (GAUZE/BANDAGES/DRESSINGS) ×10 IMPLANT
GAUZE XEROFORM 5X9 LF (GAUZE/BANDAGES/DRESSINGS) ×5 IMPLANT
GEL ULTRASOUND 20GR AQUASONIC (MISCELLANEOUS) ×5 IMPLANT
GLOVE BIO SURGEON STRL SZ 6 (GLOVE) ×25 IMPLANT
GLOVE BIO SURGEON STRL SZ 6.5 (GLOVE) ×20 IMPLANT
GLOVE BIO SURGEONS STRL SZ 6.5 (GLOVE) ×5
GLOVE BIOGEL PI IND STRL 6 (GLOVE) ×6 IMPLANT
GLOVE BIOGEL PI IND STRL 6.5 (GLOVE) ×15 IMPLANT
GLOVE BIOGEL PI INDICATOR 6 (GLOVE) ×4
GLOVE BIOGEL PI INDICATOR 6.5 (GLOVE) ×10
GLOVE EUDERMIC 7 POWDERFREE (GLOVE) ×10 IMPLANT
GOWN STRL REUS W/ TWL LRG LVL3 (GOWN DISPOSABLE) ×12 IMPLANT
GOWN STRL REUS W/ TWL XL LVL3 (GOWN DISPOSABLE) ×3 IMPLANT
GOWN STRL REUS W/TWL LRG LVL3 (GOWN DISPOSABLE) ×8
GOWN STRL REUS W/TWL XL LVL3 (GOWN DISPOSABLE) ×2
HARMONIC SHEARS 14CM COAG (MISCELLANEOUS) ×5 IMPLANT
HEMOSTAT POWDER SURGIFOAM 1G (HEMOSTASIS) ×15 IMPLANT
HEMOSTAT SURGICEL 2X14 (HEMOSTASIS) ×5 IMPLANT
IV CATH 22GX1 FEP (IV SOLUTION) ×5 IMPLANT
KIT BASIN OR (CUSTOM PROCEDURE TRAY) ×5 IMPLANT
KIT CATH CPB BARTLE (MISCELLANEOUS) ×5 IMPLANT
KIT SUCTION CATH 14FR (SUCTIONS) ×5 IMPLANT
KIT TURNOVER KIT B (KITS) ×5 IMPLANT
KIT VASOVIEW HEMOPRO 2 VH 4000 (KITS) ×5 IMPLANT
NS IRRIG 1000ML POUR BTL (IV SOLUTION) ×25 IMPLANT
PACK E OPEN HEART (SUTURE) ×5 IMPLANT
PACK OPEN HEART (CUSTOM PROCEDURE TRAY) ×5 IMPLANT
PAD ARMBOARD 7.5X6 YLW CONV (MISCELLANEOUS) ×10 IMPLANT
PAD ELECT DEFIB RADIOL ZOLL (MISCELLANEOUS) ×5 IMPLANT
PENCIL BUTTON HOLSTER BLD 10FT (ELECTRODE) ×5 IMPLANT
PUNCH AORTIC ROTATE  4.5MM 8IN (MISCELLANEOUS) ×5 IMPLANT
PUNCH AORTIC ROTATE 4.5MM 8IN (MISCELLANEOUS) ×5 IMPLANT
SET CARDIOPLEGIA MPS 5001102 (MISCELLANEOUS) ×5 IMPLANT
SHEARS HARMONIC 9CM CVD (BLADE) ×5 IMPLANT
SPONGE LAP 18X18 RF (DISPOSABLE) IMPLANT
SPONGE LAP 18X18 X RAY DECT (DISPOSABLE) ×5 IMPLANT
SPONGE LAP 4X18 RFD (DISPOSABLE) ×5 IMPLANT
STAPLER VISISTAT 35W (STAPLE) ×5 IMPLANT
SUT BONE WAX W31G (SUTURE) ×5 IMPLANT
SUT MNCRL AB 4-0 PS2 18 (SUTURE) ×5 IMPLANT
SUT PROLENE 3 0 SH DA (SUTURE) IMPLANT
SUT PROLENE 3 0 SH1 36 (SUTURE) ×5 IMPLANT
SUT PROLENE 6 0 CC (SUTURE) ×5 IMPLANT
SUT PROLENE 7 0 BV1 MDA (SUTURE) ×5 IMPLANT
SUT PROLENE 8 0 BV175 6 (SUTURE) ×10 IMPLANT
SUT SILK  1 MH (SUTURE) ×2
SUT SILK 1 MH (SUTURE) ×3 IMPLANT
SUT SILK 2 0 SH (SUTURE) ×5 IMPLANT
SUT SILK 2 0 SH CR/8 (SUTURE) ×5 IMPLANT
SUT STEEL 6MS V (SUTURE) ×10 IMPLANT
SUT STEEL SZ 6 DBL 3X14 BALL (SUTURE) IMPLANT
SUT VIC AB 1 CTX 36 (SUTURE) ×6
SUT VIC AB 1 CTX36XBRD ANBCTR (SUTURE) ×9 IMPLANT
SUT VIC AB 2-0 CT1 27 (SUTURE) ×2
SUT VIC AB 2-0 CT1 TAPERPNT 27 (SUTURE) ×3 IMPLANT
SUT VIC AB 3-0 X1 27 (SUTURE) ×5 IMPLANT
SYR 50ML SLIP (SYRINGE) ×5 IMPLANT
SYSTEM SAHARA CHEST DRAIN ATS (WOUND CARE) ×5 IMPLANT
TAPE CLOTH SOFT 2X10 (GAUZE/BANDAGES/DRESSINGS) ×5 IMPLANT
TAPE CLOTH SURG 4X10 WHT LF (GAUZE/BANDAGES/DRESSINGS) ×5 IMPLANT
TOWEL GREEN STERILE (TOWEL DISPOSABLE) ×5 IMPLANT
TOWEL GREEN STERILE FF (TOWEL DISPOSABLE) ×5 IMPLANT
TRAY FOLEY SLVR 16FR TEMP STAT (SET/KITS/TRAYS/PACK) ×5 IMPLANT
TUBING INSUFFLATION (TUBING) ×5 IMPLANT
UNDERPAD 30X30 (UNDERPADS AND DIAPERS) ×5 IMPLANT
WATER STERILE IRR 1000ML POUR (IV SOLUTION) ×10 IMPLANT

## 2018-05-24 NOTE — Anesthesia Procedure Notes (Addendum)
Procedure Name: Intubation Date/Time: 05/24/2018 8:02 AM Performed by: Imagene Riches, CRNA Pre-anesthesia Checklist: Patient identified, Emergency Drugs available, Suction available and Patient being monitored Patient Re-evaluated:Patient Re-evaluated prior to induction Oxygen Delivery Method: Circle System Utilized Preoxygenation: Pre-oxygenation with 100% oxygen Induction Type: IV induction Ventilation: Mask ventilation without difficulty and Oral airway inserted - appropriate to patient size Laryngoscope Size: Mac and 3 Grade View: Grade III Tube type: Oral Tube size: 8.0 mm Number of attempts: 1 Airway Equipment and Method: Stylet and Oral airway Placement Confirmation: ETT inserted through vocal cords under direct vision,  positive ETCO2 and breath sounds checked- equal and bilateral Secured at: 23 cm Tube secured with: Tape Dental Injury: Teeth and Oropharynx as per pre-operative assessment  Difficulty Due To: Difficult Airway- due to reduced neck mobility and Difficult Airway- due to anterior larynx Comments: First DL by Sharyn Dross, CRNA with Sabra Heck 3. Grade IV view. Patient mask ventilated. Second DL by Dr. Marcie Bal with Mac 3, grade 3 view. ETT passed through cords. BBS and + ETCO2.

## 2018-05-24 NOTE — Anesthesia Procedure Notes (Signed)
Arterial Line Insertion Start/End2/21/2020 6:40 AM, 05/24/2018 6:00 AM Performed by: Imagene Riches, CRNA, CRNA  Patient location: Pre-op. Preanesthetic checklist: patient identified, IV checked, site marked, risks and benefits discussed, surgical consent, monitors and equipment checked, pre-op evaluation, timeout performed and anesthesia consent Right was placed Catheter size: 20 G Hand hygiene performed  and maximum sterile barriers used  Allen's test indicative of satisfactory collateral circulation Attempts: 1 Procedure performed without using ultrasound guided technique. Following insertion, dressing applied and Biopatch. Post procedure assessment: normal  Patient tolerated the procedure well with no immediate complications.

## 2018-05-24 NOTE — Op Note (Addendum)
CARDIOVASCULAR SURGERY OPERATIVE NOTE  05/24/2018  Surgeon:  Gaye Pollack, MD  First Assistant: Ellwood Handler,  PA-C   Preoperative Diagnosis:  Severe multi-vessel coronary artery disease   Postoperative Diagnosis:  Same   Procedure:  1. Median Sternotomy 2. Extracorporeal circulation 3.   Coronary artery bypass grafting x 3   Left internal mammary artery graft to the LAD  Right internal mammary artery graft to the RCA  Left radial artery graft to the OM    Anesthesia:  General Endotracheal   Clinical History/Surgical Indication:   This 78 year old gentleman has a history of PCI with drug-eluting stents to the proximal and mid LAD in 2016.  He said that he felt great for about 1 month and then started slowly going downhill.  Over the past year he reports intermittent episodes of substernal chest discomfort and shortness of breath with exertion.  His stamina has decreased.  He is now admitted with acute onset of substernal chest pain that persisted for several hours and he was brought to the emergency room by his wife.  His troponin was elevated and peaked at 0.59.  Cardiac catheterization today shows a 75% ostial-proximal LAD stenosis in the area that was treated previously with a drug-eluting stent.  The other previously placed stent in the proximal and mid LAD was widely patent.  There is also about 50% mid LAD stenosis.  The proximal left circumflex was occluded after the first marginal branch with a 70% first marginal stenosis.  The right coronary artery has 75% proximal stenosis.  A 2D echocardiogram shows normal left ventricular ejection fraction of 60 to 65%.  There is no significant valvular abnormality.  I agree that coronary bypass graft surgery is the best treatment for this patient.  Unfortunately he has had prior surgical removal of varicose veins in both lower extremities  through multiple small incisions about 40 years ago and said that he underwent laser ablation of veins in both legs about 5 years ago which I have to think are probably the greater saphenous veins.  We will have vein mapping done tomorrow.  He needs 3 bypasses so I think we could use both internal mammary arteries and the left radial artery.  His upper extremity arterial Dopplers showed that the left hand is supplied by the ulnar artery and there is no change in the palmar arch flow with radial artery compression.  I will plan on doing surgery on Friday morning. I discussed the operative procedure with the patient and his daughter and son-in-law including alternatives, benefits and risks; including but not limited to bleeding, blood transfusion, infection, stroke, myocardial infarction, graft failure, heart block requiring a permanent pacemaker, organ dysfunction, and death.  Ruthell Rummage understands and agrees to proceed.    Preparation:  The patient was seen in the preoperative holding area and the correct patient, correct operation were confirmed with the patient after reviewing the medical record and catheterization. The consent was signed by me. Preoperative antibiotics were given. A pulmonary arterial line and radial arterial line were placed by the anesthesia team. The patient was taken back to the operating room and positioned supine on the operating room table. After being placed under general endotracheal anesthesia by the anesthesia team a foley catheter was placed. The neck, chest, abdomen, and both legs were prepped with betadine soap and solution and draped in the usual sterile manner. A surgical time-out was taken and the correct patient and operative procedure were confirmed with the nursing and  anesthesia staff.   Cardiopulmonary Bypass:  A median sternotomy was performed. The pericardium was opened in the midline. Right ventricular function appeared normal. The ascending aorta was of  normal size and had no palpable plaque. There were no contraindications to aortic cannulation or cross-clamping. The patient was fully systemically heparinized and the ACT was maintained > 400 sec. The proximal aortic arch was cannulated with a 20 F aortic cannula for arterial inflow. Venous cannulation was performed via the right atrial appendage using a two-staged venous cannula. An antegrade cardioplegia/vent cannula was inserted into the mid-ascending aorta. Aortic occlusion was performed with a single cross-clamp. Systemic cooling to 32 degrees Centigrade and topical cooling of the heart with iced saline were used. Hyperkalemic antegrade cold blood cardioplegia was used to induce diastolic arrest and was then given at about 20 minute intervals throughout the period of arrest to maintain myocardial temperature at or below 10 degrees centigrade. A temperature probe was inserted into the interventricular septum and an insulating pad was placed in the pericardium.   Left internal mammary artery harvest:  The left side of the sternum was retracted using the Rultract retractor. The left internal mammary artery was harvested as a pedicle graft. All side branches were clipped. It was a medium-sized vessel of good quality with excellent blood flow. It was ligated distally and divided. It was sprayed with topical papaverine solution to prevent vasospasm.  Right internal mammary artery harvest:  The right side of the sternum was retracted using the Rultract retractor. The right internal mammary artery was harvested as a pedicle graft. All side branches were clipped. It was a medium-sized vessel of good quality with excellent blood flow. It was ligated distally and divided. It was sprayed with topical papaverine solution to prevent vasospasm.  Left radial artery harvest:  The left radial artery was harvested through a longitudinal incision over the artery. It was a large artery of good quality. After exposing  it proximal to the wrist it was occluded and there was still a good doppler signal in the hand. The artery was exposed from the wrist to the antecubital fossa and the side branches divided with the harmonic scalpel. The patient was given 3000 units of heparin and after two minutes the artery was transected distally and the stump ligated with 2-0 silk suture. The proximal end was then divided and the stump suture ligated with 2-0 silk. The artery was flushed with papaverine saline solution and the large side branches clipped. The vessel was stored in a cup of papaverine saline solution until needed. The incision was closed in layers with 3-0 vicryl suture and staples used for the skin since the skin was so thin and fragile.  Coronary arteries:  The coronary arteries were examined.   LAD:  Large vessel that was heavily diseased proximally but the mid and distal vessel were free of disease.  LCX:  Large OM that had proximal disease but mid and distal vessel appeared free of disease. The distal LCX terminated as a second marginal that was long but very small caliber and not graftable.   RCA:  Large vessel in the mid portion with no disease.   Grafts:  1. LIMA to the LAD: 2.5 mm. It was sewn end to side using 8-0 prolene continuous suture. 2. RIMA to the RCA:  3.0 mm. It was sewn end to side using 8-0 prolene continuous suture. 3. Left radial artery to the OM:  1.75 mm. It was sewn end to side using  8-0 prolene continuous suture.   The proximal radial artery graft anastomosis was performed to the mid-ascending aorta using continuous 6-0 prolene suture. A graft marker was placed around the proximal anastomosis.   Completion:  The patient was rewarmed to 37 degrees Centigrade. The clamp was removed from the LIMA pedicle and there was rapid warming of the septum and return of ventricular fibrillation. The crossclamp was removed with a time of 83 minutes. There was spontaneous return of sinus  rhythm. The distal and proximal anastomoses were checked for hemostasis. The position of the grafts was satisfactory. Two temporary epicardial pacing wires were placed on the right atrium and two on the right ventricle. The patient was weaned from CPB without difficulty on dopamine 2 mcg which was started at the beginning of CPB for renal perfusion.  CPB time was 101 minutes. Cardiac output was 5 LPM. TEE showed normal LV systolic function. Heparin was fully reversed with protamine and the aortic and venous cannulas removed. Hemostasis was achieved. Mediastinal and left pleural drainage tubes were placed. The sternum was closed with  #6 stainless steel wires. The fascia was closed with continuous # 1 vicryl suture. The subcutaneous tissue was closed with 2-0 vicryl continuous suture. The skin was closed with 3-0 vicryl subcuticular suture. All sponge, needle, and instrument counts were reported correct at the end of the case. Dry sterile dressings were placed over the incisions and around the chest tubes which were connected to pleurevac suction. The patient was then transported to the surgical intensive care unit in stable condition.

## 2018-05-24 NOTE — Progress Notes (Signed)
Patient ID: Daniel Herrera, male   DOB: 1941/01/05, 78 y.o.   MRN: [id number]  TCTS Evening Rounds:   Hemodynamically stable  CI = 2.3  Extubated. Complained of some swelling around mouth and lips. He has hereditary angioedema. He was given his icatibant with improvement in how he felt. Suspect the swelling was due to ETT and TEE.  Urine output good  CT output low  CBC    Component Value Date/Time   WBC 9.0 05/24/2018 2053   RBC 3.85 (L) 05/24/2018 2053   HGB 11.6 (L) 05/24/2018 2053   HGB 15.4 09/12/2017 1034   HCT 34.9 (L) 05/24/2018 2053   HCT 46.3 09/12/2017 1034   PLT 139 (L) 05/24/2018 2053   PLT 172 09/12/2017 1034   MCV 90.6 05/24/2018 2053   MCV 96 09/12/2017 1034   MCH 30.1 05/24/2018 2053   MCHC 33.2 05/24/2018 2053   RDW 13.0 05/24/2018 2053   RDW 14.0 09/12/2017 1034   LYMPHSABS 0.5 (L) 02/09/2018 1246   LYMPHSABS 1.0 09/12/2017 1034   MONOABS 0.4 02/09/2018 1246   EOSABS 0.1 02/09/2018 1246   EOSABS 0.1 09/12/2017 1034   BASOSABS 0.0 02/09/2018 1246   BASOSABS 0.0 09/12/2017 1034     BMET    Component Value Date/Time   NA 139 05/24/2018 2047   K 4.7 05/24/2018 2047   CL 105 05/24/2018 0434   CO2 24 05/24/2018 0434   GLUCOSE 178 (H) 05/24/2018 1345   BUN 16 05/24/2018 0434   CREATININE 1.36 (H) 05/24/2018 2053   CREATININE 1.29 (H) 04/02/2015 1146   CALCIUM 9.6 05/24/2018 0434   GFRNONAA 49 (L) 05/24/2018 2053   GFRAA 57 (L) 05/24/2018 2053     A/P:  Stable postop course. Continue current plans

## 2018-05-24 NOTE — Anesthesia Procedure Notes (Signed)
Central Venous Catheter Insertion Performed by: Albertha Ghee, MD, anesthesiologist Start/End2/21/2020 7:11 AM, 05/24/2018 7:21 AM Patient location: Pre-op. Preanesthetic checklist: patient identified, IV checked, site marked, risks and benefits discussed, surgical consent, monitors and equipment checked, pre-op evaluation, timeout performed and anesthesia consent Position: Trendelenburg Lidocaine 1% used for infiltration and patient sedated Hand hygiene performed , maximum sterile barriers used  and Seldinger technique used Catheter size: 8.5 Fr Central line and PA cath was placed.Sheath introducer Swan type:thermodilation Procedure performed using ultrasound guided technique. Ultrasound Notes:anatomy identified, needle tip was noted to be adjacent to the nerve/plexus identified, no ultrasound evidence of intravascular and/or intraneural injection and image(s) printed for medical record Attempts: 1 Following insertion, line sutured, dressing applied and Biopatch. Post procedure assessment: blood return through all ports, free fluid flow and no air  Patient tolerated the procedure well with no immediate complications.

## 2018-05-24 NOTE — Procedures (Signed)
Extubation Procedure Note  Patient Details:   Name: Daniel Herrera DOB: [birth year] MRN: [id number]   Airway Documentation:  Airway 8 mm (Active)  Secured at (cm) 23 cm 05/24/2018  3:05 PM  Measured From Lips 05/24/2018  3:05 PM  Secured Location Left 05/24/2018  3:05 PM  Secured By Pink Tape 05/24/2018  3:05 PM   Vent end date: (not recorded) Vent end time: (not recorded)   Evaluation  O2 sats: stable throughout Complications: No apparent complications Patient did tolerate procedure well. Bilateral Breath Sounds: Clear   Yes  Patient had a NIF of -40 and a VC of 1.0 pre extubation. Patient extubated to 4L nasal canula. Oriented to time and place with productive cough. Daniel Herrera 05/24/2018, 6:51 PM

## 2018-05-24 NOTE — Brief Op Note (Signed)
05/20/2018 - 05/24/2018  7:40 PM  PATIENT:  Daniel Herrera  78 y.o. male  PRE-OPERATIVE DIAGNOSIS:  CAD  POST-OPERATIVE DIAGNOSIS:  CAD  PROCEDURE:  Procedure(s):  CORONARY ARTERY BYPASS GRAFTING x 3 -LIMA to LAD -RIMA to RCA -RADIAL ARTERY TO OM  OPEN LEFT RADIAL ARTERY HARVEST  TRANSESOPHAGEAL ECHOCARDIOGRAM (TEE) (N/A)  SURGEON:  Surgeon(s) and Role:    * Bartle, Fernande Boyden, MD - Primary  PHYSICIAN ASSISTANT: Laelah Siravo PA-C  ANESTHESIA:   general  EBL:  500 mL   BLOOD ADMINISTERED:CELLSAVER  DRAINS: Left and Right Pleural Chest Tube, Mediastinal Chest Drains   LOCAL MEDICATIONS USED:  NONE  SPECIMEN:  No Specimen  DISPOSITION OF SPECIMEN:  N/A  COUNTS:  YES  TOURNIQUET:  * No tourniquets in log *  DICTATION: .Dragon Dictation  PLAN OF CARE: Admit to inpatient   PATIENT DISPOSITION:  ICU - intubated and hemodynamically stable.   Delay start of Pharmacological VTE agent (>24hrs) due to surgical blood loss or risk of bleeding: yes

## 2018-05-24 NOTE — Transfer of Care (Signed)
Immediate Anesthesia Transfer of Care Note  Patient: Daniel Herrera  Procedure(s) Performed: CORONARY ARTERY BYPASS GRAFTING (CABG), ON PUMP, TIMES THREE, USING BILATERAL INTERNAL MAMMARY ARTERY AND HARVESTED LEFT RADIAL ARTERY (N/A Chest) RADIAL ARTERY HARVEST (Left Arm Lower) TRANSESOPHAGEAL ECHOCARDIOGRAM (TEE) (N/A )  Patient Location: ICU  Anesthesia Type:General  Level of Consciousness: Patient remains intubated per anesthesia plan  Airway & Oxygen Therapy: Patient remains intubated per anesthesia plan and Patient placed on Ventilator (see vital sign flow sheet for setting)  Post-op Assessment: Report given to RN and Post -op Vital signs reviewed and stable  Post vital signs: Reviewed and stable  Last Vitals:  Vitals Value Taken Time  BP    Temp    Pulse    Resp    SpO2      Last Pain:  Vitals:   05/24/18 0448  TempSrc: Oral  PainSc:          Complications: No apparent anesthesia complications

## 2018-05-24 NOTE — Progress Notes (Signed)
  Echocardiogram Echocardiogram Transesophageal has been performed.  Guadalupe Nickless L Androw 05/24/2018, 8:21 AM

## 2018-05-24 NOTE — Progress Notes (Signed)
Patient extubated at 1850 without difficulty.  Patient now complaining of lips starting to swell and panicking that this is the beginning of his anaphylaxis and that he needs his medicine.  Asked RN to call his family to have them to bring his medicine up for anaphylaxis.  Daughter Renita called and updated on status and that patient felt like his lips were swelling.  States she will bring medicine up for him.  RN noted that he does have swelling in his lips, but no more than what he did when he first came in from the OR.  Anesthesia noted that they had difficulty intubating him and that they nicked his lips during intubation.  Dr. Cyndia Bent also made aware of patient's status.

## 2018-05-25 ENCOUNTER — Inpatient Hospital Stay (HOSPITAL_COMMUNITY): Payer: Medicare Other

## 2018-05-25 LAB — CBC
HCT: 32.4 % — ABNORMAL LOW (ref 39.0–52.0)
HCT: 29.8 % — ABNORMAL LOW (ref 39.0–52.0)
Hemoglobin: 10.5 g/dL — ABNORMAL LOW (ref 13.0–17.0)
Hemoglobin: 9.6 g/dL — ABNORMAL LOW (ref 13.0–17.0)
MCH: 30 pg (ref 26.0–34.0)
MCH: 30.4 pg (ref 26.0–34.0)
MCHC: 32.4 g/dL (ref 30.0–36.0)
MCHC: 32.2 g/dL (ref 30.0–36.0)
MCV: 92.6 fL (ref 80.0–100.0)
MCV: 94.3 fL (ref 80.0–100.0)
Platelets: 139 10*3/uL — ABNORMAL LOW (ref 150–400)
Platelets: 133 10*3/uL — ABNORMAL LOW (ref 150–400)
RBC: 3.5 MIL/uL — ABNORMAL LOW (ref 4.22–5.81)
RBC: 3.16 MIL/uL — ABNORMAL LOW (ref 4.22–5.81)
RDW: 13.2 % (ref 11.5–15.5)
RDW: 13.5 % (ref 11.5–15.5)
WBC: 10.2 10*3/uL (ref 4.0–10.5)
WBC: 8.8 10*3/uL (ref 4.0–10.5)
nRBC: 0 % (ref 0.0–0.2)
nRBC: 0 % (ref 0.0–0.2)

## 2018-05-25 LAB — BASIC METABOLIC PANEL
ANION GAP: 8 (ref 5–15)
Anion gap: 8 (ref 5–15)
BUN: 19 mg/dL (ref 8–23)
BUN: 20 mg/dL (ref 8–23)
CO2: 23 mmol/L (ref 22–32)
CO2: 22 mmol/L (ref 22–32)
Calcium: 8.3 mg/dL — ABNORMAL LOW (ref 8.9–10.3)
Calcium: 8.5 mg/dL — ABNORMAL LOW (ref 8.9–10.3)
Chloride: 108 mmol/L (ref 98–111)
Chloride: 108 mmol/L (ref 98–111)
Creatinine, Ser: 1.35 mg/dL — ABNORMAL HIGH (ref 0.61–1.24)
Creatinine, Ser: 1.46 mg/dL — ABNORMAL HIGH (ref 0.61–1.24)
GFR calc Af Amer: 57 mL/min — ABNORMAL LOW (ref >60)
GFR calc Af Amer: 52 mL/min — ABNORMAL LOW (ref >60)
GFR calc non Af Amer: 50 mL/min — ABNORMAL LOW (ref >60)
GFR calc non Af Amer: 45 mL/min — ABNORMAL LOW (ref >60)
Glucose, Bld: 114 mg/dL — ABNORMAL HIGH (ref 70–99)
Glucose, Bld: 160 mg/dL — ABNORMAL HIGH (ref 70–99)
POTASSIUM: 4.4 mmol/L (ref 3.5–5.1)
Potassium: 4.5 mmol/L (ref 3.5–5.1)
Sodium: 139 mmol/L (ref 135–145)
Sodium: 138 mmol/L (ref 135–145)

## 2018-05-25 LAB — GLUCOSE, CAPILLARY
GLUCOSE-CAPILLARY: 118 mg/dL — AB (ref 70–99)
GLUCOSE-CAPILLARY: 126 mg/dL — AB (ref 70–99)
GLUCOSE-CAPILLARY: 160 mg/dL — AB (ref 70–99)
Glucose-Capillary: 104 mg/dL — ABNORMAL HIGH (ref 70–99)
Glucose-Capillary: 108 mg/dL — ABNORMAL HIGH (ref 70–99)
Glucose-Capillary: 112 mg/dL — ABNORMAL HIGH (ref 70–99)
Glucose-Capillary: 117 mg/dL — ABNORMAL HIGH (ref 70–99)
Glucose-Capillary: 118 mg/dL — ABNORMAL HIGH (ref 70–99)
Glucose-Capillary: 119 mg/dL — ABNORMAL HIGH (ref 70–99)
Glucose-Capillary: 119 mg/dL — ABNORMAL HIGH (ref 70–99)
Glucose-Capillary: 125 mg/dL — ABNORMAL HIGH (ref 70–99)
Glucose-Capillary: 126 mg/dL — ABNORMAL HIGH (ref 70–99)
Glucose-Capillary: 135 mg/dL — ABNORMAL HIGH (ref 70–99)
Glucose-Capillary: 155 mg/dL — ABNORMAL HIGH (ref 70–99)
Glucose-Capillary: 158 mg/dL — ABNORMAL HIGH (ref 70–99)
Glucose-Capillary: 178 mg/dL — ABNORMAL HIGH (ref 70–99)

## 2018-05-25 LAB — HEMOGLOBIN AND HEMATOCRIT, BLOOD
HCT: 29.5 % — ABNORMAL LOW (ref 39.0–52.0)
HEMOGLOBIN: 9.9 g/dL — AB (ref 13.0–17.0)

## 2018-05-25 LAB — MAGNESIUM
Magnesium: 2.7 mg/dL — ABNORMAL HIGH (ref 1.7–2.4)
Magnesium: 2.5 mg/dL — ABNORMAL HIGH (ref 1.7–2.4)

## 2018-05-25 MED ORDER — FUROSEMIDE 10 MG/ML IJ SOLN
20.0000 mg | Freq: Two times a day (BID) | INTRAMUSCULAR | Status: DC
  Administered 2018-05-25: 20 mg via INTRAVENOUS
  Filled 2018-05-25: qty 2

## 2018-05-25 MED ORDER — CHLORHEXIDINE GLUCONATE CLOTH 2 % EX PADS
6.0000 | MEDICATED_PAD | Freq: Every day | CUTANEOUS | Status: DC
  Administered 2018-05-25 – 2018-05-28 (×3): 6 via TOPICAL

## 2018-05-25 MED ORDER — INSULIN DETEMIR 100 UNIT/ML ~~LOC~~ SOLN
8.0000 [IU] | Freq: Two times a day (BID) | SUBCUTANEOUS | Status: DC
  Administered 2018-05-25 (×2): 8 [IU] via SUBCUTANEOUS
  Filled 2018-05-25 (×3): qty 0.08

## 2018-05-25 MED ORDER — TRAMADOL HCL 50 MG PO TABS
50.0000 mg | ORAL_TABLET | Freq: Four times a day (QID) | ORAL | Status: DC | PRN
  Administered 2018-05-27 – 2018-05-28 (×2): 50 mg via ORAL
  Filled 2018-05-25 (×2): qty 1

## 2018-05-25 MED ORDER — SODIUM CHLORIDE 0.9% FLUSH: 10.0000 mL | INTRAVENOUS | Status: DC | PRN

## 2018-05-25 MED ORDER — ISOSORBIDE MONONITRATE ER 30 MG PO TB24
30.0000 mg | ORAL_TABLET | Freq: Every day | ORAL | Status: DC
  Administered 2018-05-25 – 2018-05-30 (×6): 30 mg via ORAL
  Filled 2018-05-25 (×6): qty 1

## 2018-05-25 MED ORDER — SODIUM CHLORIDE 0.9% FLUSH
10.0000 mL | Freq: Two times a day (BID) | INTRAVENOUS | Status: DC
  Administered 2018-05-25 – 2018-05-28 (×4): 10 mL

## 2018-05-25 MED ORDER — INSULIN ASPART 100 UNIT/ML ~~LOC~~ SOLN
0.0000 [IU] | SUBCUTANEOUS | Status: DC
  Administered 2018-05-25: 2 [IU] via SUBCUTANEOUS
  Administered 2018-05-25: 4 [IU] via SUBCUTANEOUS
  Administered 2018-05-25 – 2018-05-26 (×4): 2 [IU] via SUBCUTANEOUS

## 2018-05-25 NOTE — Progress Notes (Signed)
Patient ID: Daniel Herrera, male   DOB: 23-Jan-1941, 78 y.o.   MRN: [id number] TCTS Evening Rounds:  Hemodynamically stable. Sinus rhythm Not much response to 20 mg lasix IV today and creat up slightly tonight to 1.46. It was 1.52 preop. Will follow for now. May need higher dose lasix.  BMET    Component Value Date/Time   NA 138 05/25/2018 1646   K 4.4 05/25/2018 1646   CL 108 05/25/2018 1646   CO2 22 05/25/2018 1646   GLUCOSE 160 (H) 05/25/2018 1646   BUN 20 05/25/2018 1646   CREATININE 1.46 (H) 05/25/2018 1646   CREATININE 1.29 (H) 04/02/2015 1146   CALCIUM 8.5 (L) 05/25/2018 1646   GFRNONAA 45 (L) 05/25/2018 1646   GFRAA 52 (L) 05/25/2018 1646   CBC    Component Value Date/Time   WBC 8.8 05/25/2018 1646   RBC 3.16 (L) 05/25/2018 1646   HGB 9.6 (L) 05/25/2018 1646   HGB 15.4 09/12/2017 1034   HCT 29.8 (L) 05/25/2018 1646   HCT 46.3 09/12/2017 1034   PLT 133 (L) 05/25/2018 1646   PLT 172 09/12/2017 1034   MCV 94.3 05/25/2018 1646   MCV 96 09/12/2017 1034   MCH 30.4 05/25/2018 1646   MCHC 32.2 05/25/2018 1646   RDW 13.5 05/25/2018 1646   RDW 14.0 09/12/2017 1034   LYMPHSABS 0.5 (L) 02/09/2018 1246   LYMPHSABS 1.0 09/12/2017 1034   MONOABS 0.4 02/09/2018 1246   EOSABS 0.1 02/09/2018 1246   EOSABS 0.1 09/12/2017 1034   BASOSABS 0.0 02/09/2018 1246   BASOSABS 0.0 09/12/2017 1034   Chest tubes out today.

## 2018-05-26 ENCOUNTER — Inpatient Hospital Stay (HOSPITAL_COMMUNITY): Payer: Medicare Other

## 2018-05-26 ENCOUNTER — Other Ambulatory Visit: Payer: Self-pay

## 2018-05-26 ENCOUNTER — Telehealth: Payer: Self-pay | Admitting: Allergy & Immunology

## 2018-05-26 LAB — CBC
HCT: 25.9 % — ABNORMAL LOW (ref 39.0–52.0)
Hemoglobin: 8.1 g/dL — ABNORMAL LOW (ref 13.0–17.0)
MCH: 29.9 pg (ref 26.0–34.0)
MCHC: 31.3 g/dL (ref 30.0–36.0)
MCV: 95.6 fL (ref 80.0–100.0)
Platelets: 115 10*3/uL — ABNORMAL LOW (ref 150–400)
RBC: 2.71 MIL/uL — ABNORMAL LOW (ref 4.22–5.81)
RDW: 13.5 % (ref 11.5–15.5)
WBC: 6.9 10*3/uL (ref 4.0–10.5)
nRBC: 0 % (ref 0.0–0.2)

## 2018-05-26 LAB — BASIC METABOLIC PANEL
Anion gap: 8 (ref 5–15)
BUN: 21 mg/dL (ref 8–23)
CO2: 21 mmol/L — ABNORMAL LOW (ref 22–32)
Calcium: 7.6 mg/dL — ABNORMAL LOW (ref 8.9–10.3)
Chloride: 106 mmol/L (ref 98–111)
Creatinine, Ser: 1.15 mg/dL (ref 0.61–1.24)
GFR calc Af Amer: >60 mL/min (ref >60)
GFR calc non Af Amer: >60 mL/min (ref >60)
Glucose, Bld: 117 mg/dL — ABNORMAL HIGH (ref 70–99)
Potassium: 4.1 mmol/L (ref 3.5–5.1)
Sodium: 135 mmol/L (ref 135–145)

## 2018-05-26 LAB — GLUCOSE, CAPILLARY
GLUCOSE-CAPILLARY: 148 mg/dL — AB (ref 70–99)
GLUCOSE-CAPILLARY: 141 mg/dL — AB (ref 70–99)
Glucose-Capillary: 122 mg/dL — ABNORMAL HIGH (ref 70–99)
Glucose-Capillary: 152 mg/dL — ABNORMAL HIGH (ref 70–99)
Glucose-Capillary: 145 mg/dL — ABNORMAL HIGH (ref 70–99)

## 2018-05-26 MED ORDER — METOPROLOL TARTRATE 25 MG/10 ML ORAL SUSPENSION
25.0000 mg | Freq: Two times a day (BID) | ORAL | Status: DC
  Filled 2018-05-26: qty 10

## 2018-05-26 MED ORDER — AMIODARONE LOAD VIA INFUSION
150.0000 mg | Freq: Once | INTRAVENOUS | Status: AC
  Administered 2018-05-26: 150 mg via INTRAVENOUS
  Filled 2018-05-26: qty 83.34

## 2018-05-26 MED ORDER — AMIODARONE HCL IN DEXTROSE 360-4.14 MG/200ML-% IV SOLN
60.0000 mg/h | INTRAVENOUS | Status: AC
  Administered 2018-05-26 (×2): 60 mg/h via INTRAVENOUS
  Filled 2018-05-26 (×2): qty 200

## 2018-05-26 MED ORDER — FUROSEMIDE 10 MG/ML IJ SOLN
40.0000 mg | Freq: Once | INTRAMUSCULAR | Status: AC
  Administered 2018-05-26: 40 mg via INTRAVENOUS
  Filled 2018-05-26: qty 4

## 2018-05-26 MED ORDER — INSULIN DETEMIR 100 UNIT/ML ~~LOC~~ SOLN
10.0000 [IU] | Freq: Every day | SUBCUTANEOUS | Status: DC
  Administered 2018-05-26 – 2018-05-28 (×3): 10 [IU] via SUBCUTANEOUS
  Filled 2018-05-26 (×3): qty 0.1

## 2018-05-26 MED ORDER — POTASSIUM CHLORIDE CRYS ER 20 MEQ PO TBCR
20.0000 meq | EXTENDED_RELEASE_TABLET | Freq: Two times a day (BID) | ORAL | Status: AC
  Administered 2018-05-26 (×2): 20 meq via ORAL
  Filled 2018-05-26 (×2): qty 1

## 2018-05-26 MED ORDER — METOPROLOL TARTRATE 25 MG PO TABS
25.0000 mg | ORAL_TABLET | Freq: Two times a day (BID) | ORAL | Status: DC
  Administered 2018-05-26 – 2018-05-28 (×5): 25 mg via ORAL
  Filled 2018-05-26 (×5): qty 1

## 2018-05-26 MED ORDER — AMIODARONE IV BOLUS ONLY 150 MG/100ML
150.0000 mg | Freq: Once | INTRAVENOUS | Status: DC
  Filled 2018-05-26: qty 100

## 2018-05-26 MED ORDER — INSULIN ASPART 100 UNIT/ML ~~LOC~~ SOLN
0.0000 [IU] | Freq: Three times a day (TID) | SUBCUTANEOUS | Status: DC
  Administered 2018-05-26 – 2018-05-27 (×3): 2 [IU] via SUBCUTANEOUS
  Administered 2018-05-27: 4 [IU] via SUBCUTANEOUS
  Administered 2018-05-27 – 2018-05-28 (×2): 2 [IU] via SUBCUTANEOUS

## 2018-05-26 MED ORDER — FE FUMARATE-B12-VIT C-FA-IFC PO CAPS
1.0000 | ORAL_CAPSULE | Freq: Two times a day (BID) | ORAL | Status: DC
  Administered 2018-05-26 – 2018-05-30 (×8): 1 via ORAL
  Filled 2018-05-26 (×8): qty 1

## 2018-05-26 MED ORDER — AMIODARONE HCL IN DEXTROSE 360-4.14 MG/200ML-% IV SOLN
30.0000 mg/h | INTRAVENOUS | Status: DC
  Administered 2018-05-27: 30 mg/h via INTRAVENOUS
  Filled 2018-05-26 (×2): qty 200

## 2018-05-26 NOTE — Progress Notes (Signed)
Patient ID: Daniel Herrera, male   DOB: 05-11-1940, 78 y.o.   MRN: [id number] TCTS Evening Rounds:  Hemodynamically stable but still in atrial fib with rate 120's. Will give him another bolus  of amiodarone and continue drip.  sats 92% on [place].

## 2018-05-26 NOTE — Progress Notes (Signed)
Patient experienced change in HR and rhythm to AFib with RVR (rate 110-150) after ambulating at approximately 0615 this am.  Amiodarone gtt started at 0730 after bolus. Over course of shift, patient remained in A-fib, with HR 100-120.  Patient states he "feels better".  B/P 110/60 - 130/70.  No respiratory SE noted.

## 2018-05-26 NOTE — Progress Notes (Signed)
  Amiodarone Drug - Drug Interaction Consult Note  Recommendations: - No medication changes warranted at this time.   - Amiodarone can interact with tramadol (ordered prn, has used once), monitor patient for respiratory depression if use increases.  - Of note, no expected interaction with home med icatibant (verified on UptoDate and Micromedex).   - Monitor for bradycardia and myalgias  Amiodarone is metabolized by the cytochrome P450 system and therefore has the potential to cause many drug interactions. Amiodarone has an average plasma half-life of 50 days (range 20 to 100 days).   There is potential for drug interactions to occur several weeks or months after stopping treatment and the onset of drug interactions may be slow after initiating amiodarone.   [x]  Statins: Increased risk of myopathy. Simvastatin- restrict dose to 20mg  daily. Other statins: counsel patients to report any muscle pain or weakness immediately.  []  Anticoagulants: Amiodarone can increase anticoagulant effect. Consider warfarin dose reduction. Patients should be monitored closely and the dose of anticoagulant altered accordingly, remembering that amiodarone levels take several weeks to stabilize.  []  Antiepileptics: Amiodarone can increase plasma concentration of phenytoin, the dose should be reduced. Note that small changes in phenytoin dose can result in large changes in levels. Monitor patient and counsel on signs of toxicity.  [x]  Beta blockers: increased risk of bradycardia, AV block and myocardial depression. Sotalol - avoid concomitant use.  []   Calcium channel blockers (diltiazem and verapamil): increased risk of bradycardia, AV block and myocardial depression.  []   Cyclosporine: Amiodarone increases levels of cyclosporine. Reduced dose of cyclosporine is recommended.  []  Digoxin dose should be halved when amiodarone is started.  []  Diuretics: increased risk of cardiotoxicity if hypokalemia  occurs.  []  Oral hypoglycemic agents (glyburide, glipizide, glimepiride): increased risk of hypoglycemia. Patient's glucose levels should be monitored closely when initiating amiodarone therapy.   []  Drugs that prolong the QT interval:  Torsades de pointes risk may be increased with concurrent use - avoid if possible.  Monitor QTc, also keep magnesium/potassium WNL if concurrent therapy can't be avoided. [name] Antibiotics: e.g. fluoroquinolones, erythromycin. . Antiarrhythmics: e.g. quinidine, procainamide, disopyramide, sotalol. . Antipsychotics: e.g. phenothiazines, haloperidol.  . Lithium, tricyclic antidepressants, and methadone. Thank You,   Claiborne Billings, PharmD PGY2 Cardiology Pharmacy Resident Please check AMION for all Pharmacist numbers by unit 05/26/2018 8:00 AM

## 2018-05-26 NOTE — Telephone Encounter (Signed)
I received a call from Mr. Messimer's daughter requesting refills on the [place]. Evidently they needed some of it after his bypass surgery. She does still have two injections left. I am unsure how to order over the weekend, but will route this to [name].  The patient's daughter mentioned that there was a mail order pharmacy that took care of this, but I do not see that as an option when I reorder it.   Salvatore Marvel, MD [facility]

## 2018-05-26 NOTE — Progress Notes (Signed)
2 Days Post-Op Procedure(s) (LRB): CORONARY ARTERY BYPASS GRAFTING (CABG), ON PUMP, TIMES THREE, USING BILATERAL INTERNAL MAMMARY ARTERY AND HARVESTED LEFT RADIAL ARTERY (N/A) RADIAL ARTERY HARVEST (Left) TRANSESOPHAGEAL ECHOCARDIOGRAM (TEE) (N/A) Subjective: Doesn't feel well today since in atrial fib. Ambulated this am and then went into rapid atrial fib. Started on amio.  Objective: Vital signs in last 24 hours: Temp:  [97.5 F (36.4 C)-98.7 F (37.1 C)] 98.7 F (37.1 C) (02/23 0755) Pulse Rate:  [73-117] 117 (02/23 0900) Cardiac Rhythm: Atrial fibrillation (02/23 0900) Resp:  [10-25] 25 (02/23 0900) BP: (104-146)/(49-86) 140/82 (02/23 0900) SpO2:  [90 %-100 %] 97 % (02/23 0900) Arterial Line BP: (147-180)/(59-73) 178/63 (02/22 1200) Weight:  [94.7 kg] 94.7 kg (02/23 0500)  Hemodynamic parameters for last 24 hours: PAP: (20-28)/(11-20) 28/15  Intake/Output from previous day: 02/22 0701 - 02/23 0700 In: 1770.5 [P.O.:1040; I.V.:530.5; IV Piggyback:200] Out: 1500 [Urine:1360; Chest Tube:140] Intake/Output this shift: Total I/O In: 120 [P.O.:120] Out: -   General appearance: alert and cooperative Neurologic: intact Heart: irregularly irregular rhythm Lungs: diminished breath sounds bibasilar Extremities: edema mild Wound: dressings dry  Lab Results: Recent Labs    05/25/18 1646 05/26/18 0400  WBC 8.8 6.9  HGB 9.6* 8.1*  HCT 29.8* 25.9*  PLT 133* 115*   BMET:  Recent Labs    05/25/18 1646 05/26/18 0400  NA 138 135  K 4.4 4.1  CL 108 106  CO2 22 21*  GLUCOSE 160* 117*  BUN 20 21  CREATININE 1.46* 1.15  CALCIUM 8.5* 7.6*    PT/INR:  Recent Labs    05/24/18 1530  LABPROT 16.6*  INR 1.36   ABG    Component Value Date/Time   PHART 7.370 05/24/2018 2047   HCO3 22.0 05/24/2018 2047   TCO2 23 05/24/2018 2047   ACIDBASEDEF 3.0 (H) 05/24/2018 2047   O2SAT 99.0 05/24/2018 2047   CBG (last 3)  Recent Labs    05/25/18 1915 05/25/18 2305  05/26/18 0328  GLUCAP 160* 155* 122*   CXR: bibasilar atelectasis and may be small effusions.  Assessment/Plan: S/P Procedure(s) (LRB): CORONARY ARTERY BYPASS GRAFTING (CABG), ON PUMP, TIMES THREE, USING BILATERAL INTERNAL MAMMARY ARTERY AND HARVESTED LEFT RADIAL ARTERY (N/A) RADIAL ARTERY HARVEST (Left) TRANSESOPHAGEAL ECHOCARDIOGRAM (TEE) (N/A)  POD 2 Hemodynamically stable   Postop atrial fib with RVR: started on amiodarone. Continue Lopressor.   Expected acute postop blood loss anemia: start iron and follow.  Mild volume excess. He is about 8 lbs over preop. Diurese and replace K+  DM: preop Hgb A1c was 6.5. No hx of DM. Will continue Levemir and SSI for now and decide about need for oral med vs diet modification alone. He has problems with a lot of meds so hesitant to start anything that he does not absolutely need.  Hyperlipidemia: on statin.  Continue Imdur for radial artery graft for 1 month.   Continue IS and ambulation.   LOS: 5 days    Gaye Pollack 05/26/2018

## 2018-05-27 ENCOUNTER — Telehealth: Payer: Self-pay | Admitting: *Deleted

## 2018-05-27 ENCOUNTER — Encounter (HOSPITAL_COMMUNITY): Payer: Self-pay | Admitting: Surgery

## 2018-05-27 DIAGNOSIS — I480 Paroxysmal atrial fibrillation: Secondary | ICD-10-CM

## 2018-05-27 LAB — CBC
HCT: 27.5 % — ABNORMAL LOW (ref 39.0–52.0)
Hemoglobin: 8.7 g/dL — ABNORMAL LOW (ref 13.0–17.0)
MCH: 29.8 pg (ref 26.0–34.0)
MCHC: 31.6 g/dL (ref 30.0–36.0)
MCV: 94.2 fL (ref 80.0–100.0)
Platelets: 124 10*3/uL — ABNORMAL LOW (ref 150–400)
RBC: 2.92 MIL/uL — ABNORMAL LOW (ref 4.22–5.81)
RDW: 13.5 % (ref 11.5–15.5)
WBC: 7.3 10*3/uL (ref 4.0–10.5)
nRBC: 0 % (ref 0.0–0.2)

## 2018-05-27 LAB — GLUCOSE, CAPILLARY
GLUCOSE-CAPILLARY: 147 mg/dL — AB (ref 70–99)
Glucose-Capillary: 170 mg/dL — ABNORMAL HIGH (ref 70–99)
Glucose-Capillary: 151 mg/dL — ABNORMAL HIGH (ref 70–99)
Glucose-Capillary: 140 mg/dL — ABNORMAL HIGH (ref 70–99)

## 2018-05-27 LAB — BASIC METABOLIC PANEL
Anion gap: 7 (ref 5–15)
BUN: 23 mg/dL (ref 8–23)
CO2: 24 mmol/L (ref 22–32)
CREATININE: 1.35 mg/dL — AB (ref 0.61–1.24)
Calcium: 7.8 mg/dL — ABNORMAL LOW (ref 8.9–10.3)
Chloride: 103 mmol/L (ref 98–111)
GFR calc Af Amer: 57 mL/min — ABNORMAL LOW (ref >60)
GFR calc non Af Amer: 50 mL/min — ABNORMAL LOW (ref >60)
Glucose, Bld: 189 mg/dL — ABNORMAL HIGH (ref 70–99)
Potassium: 3.9 mmol/L (ref 3.5–5.1)
Sodium: 134 mmol/L — ABNORMAL LOW (ref 135–145)

## 2018-05-27 MED ORDER — FUROSEMIDE 40 MG PO TABS
40.0000 mg | ORAL_TABLET | Freq: Every day | ORAL | Status: DC
  Administered 2018-05-27 – 2018-05-28 (×2): 40 mg via ORAL
  Filled 2018-05-27 (×2): qty 1

## 2018-05-27 MED ORDER — POTASSIUM CHLORIDE CRYS ER 20 MEQ PO TBCR
20.0000 meq | EXTENDED_RELEASE_TABLET | Freq: Every day | ORAL | Status: AC
  Administered 2018-05-28 – 2018-05-30 (×3): 20 meq via ORAL
  Filled 2018-05-27 (×3): qty 1

## 2018-05-27 MED ORDER — AMIODARONE HCL 200 MG PO TABS
400.0000 mg | ORAL_TABLET | Freq: Two times a day (BID) | ORAL | Status: DC
  Administered 2018-05-27 – 2018-05-30 (×7): 400 mg via ORAL
  Filled 2018-05-27 (×7): qty 2

## 2018-05-27 MED ORDER — POTASSIUM CHLORIDE CRYS ER 20 MEQ PO TBCR
20.0000 meq | EXTENDED_RELEASE_TABLET | Freq: Two times a day (BID) | ORAL | Status: AC
  Administered 2018-05-27 (×2): 20 meq via ORAL
  Filled 2018-05-27 (×2): qty 1

## 2018-05-27 MED FILL — Thrombin (Recombinant) For Soln 20000 Unit: CUTANEOUS | Qty: 1 | Status: AC

## 2018-05-27 NOTE — Progress Notes (Addendum)
Progress Note  Patient Name: Daniel Herrera Date of Encounter: 05/27/2018  Primary Cardiologist: Quay Burow, MD   Subjective   Patient feeling better this AM. Chest pain is 4/10. His SHOB has improved. He feels very weak.   Inpatient Medications    Scheduled Meds: . aspirin EC  325 mg Oral Daily   Or  . aspirin  324 mg Per Tube Daily  . azelastine  1 spray Each Nare BID  . bisacodyl  10 mg Oral Daily   Or  . bisacodyl  10 mg Rectal Daily  . Chlorhexidine Gluconate Cloth  6 each Topical Daily  . docusate sodium  200 mg Oral Daily  . ferrous [id number] C-folic acid  1 capsule Oral BID PC  . fluticasone  1 spray Each Nare Daily  . insulin aspart  0-24 Units Subcutaneous TID WC  . insulin detemir  10 Units Subcutaneous Daily  . isosorbide mononitrate  30 mg Oral Daily  . levothyroxine  75 mcg Oral QAC breakfast  . mouth rinse  15 mL Mouth Rinse q12n4p  . metoprolol tartrate  25 mg Oral BID   Or  . metoprolol tartrate  25 mg Per Tube BID  . mupirocin ointment  1 application Nasal BID  . pantoprazole  40 mg Oral Daily  . pravastatin  80 mg Oral q1800  . sodium chloride flush  10-40 mL Intracatheter Q12H  . sodium chloride flush  3 mL Intravenous Q12H   Continuous Infusions: . amiodarone 30 mg/hr (05/27/18 0657)  . amiodarone 600 mL/hr at 05/26/18 1746  . lactated ringers 20 mL/hr at 05/25/18 0300  . lactated ringers 10 mL/hr (05/26/18 1900)   PRN Meds: home med stored in pyxis, icatibant, metoprolol tartrate, morphine injection, ondansetron (ZOFRAN) IV, oxyCODONE, sodium chloride flush, sodium chloride flush, traMADol   Vital Signs    Vitals:   05/27/18 0650 05/27/18 0700 05/27/18 0800 05/27/18 0803  BP:  118/81 136/86   Pulse:  (!) 125 (!) 120   Resp:  (!) 21 19   Temp: 98.7 F (37.1 C)   98.7 F (37.1 C)  TempSrc: Oral   Oral  SpO2:  93% 94%   Weight:      Height:        Intake/Output Summary (Last 24 hours) at 05/27/2018 0853 Last data  filed at 05/27/2018 0800 Gross per 24 hour  Intake 1636.08 ml  Output 2210 ml  Net -573.92 ml   Filed Weights   05/25/18 0500 05/26/18 0500 05/27/18 0500  Weight: 94 kg 94.7 kg 93.4 kg   Telemetry    Currently in NSR but flipping in and out of A-fib - Personally Reviewed  ECG   No new EKG  Physical Exam   Today's Vitals   05/27/18 0650 05/27/18 0700 05/27/18 0800 05/27/18 0803  BP:  118/81 136/86   Pulse:  (!) 125 (!) 120   Resp:  (!) 21 19   Temp: 98.7 F (37.1 C)   98.7 F (37.1 C)  TempSrc: Oral   Oral  SpO2:  93% 94%   Weight:      Height:      PainSc:    2    Body mass index is 27.17 kg/m.  GEN: No acute distress.   Neck: No JVD Cardiac: RRR, no murmurs, rubs, or gallops. Bandage clean and dry  Respiratory: Clear to auscultation bilaterally. GI: Soft, nontender, non-distended  MS: No edema; No deformity. Neuro:  Nonfocal  Psych: Normal affect  Labs    Chemistry Recent Labs  Lab 05/25/18 1646 05/26/18 0400 05/27/18 0424  NA 138 135 134*  K 4.4 4.1 3.9  CL 108 106 103  CO2 22 21* 24  GLUCOSE 160* 117* 189*  BUN 20 21 23   CREATININE 1.46* 1.15 1.35*  CALCIUM 8.5* 7.6* 7.8*  GFRNONAA 45* >60 50*  GFRAA 52* >60 57*  ANIONGAP 8 8 7     Hematology Recent Labs  Lab 05/25/18 1646 05/26/18 0400 05/27/18 0424  WBC 8.8 6.9 7.3  RBC 3.16* 2.71* 2.92*  HGB 9.6* 8.1* 8.7*  HCT 29.8* 25.9* 27.5*  MCV 94.3 95.6 94.2  MCH 30.4 29.9 29.8  MCHC 32.2 31.3 31.6  RDW 13.5 13.5 13.5  PLT 133* 115* 124*   Cardiac Enzymes Recent Labs  Lab 05/21/18 1058 05/21/18 1643 05/22/18 0027  TROPONINI 0.59* 0.38* 0.25*    Recent Labs  Lab 05/20/18 2251 05/21/18 0811  TROPIPOC 0.04 0.59*    BNPNo results for input(s): BNP, PROBNP in the last 168 hours.   DDimer No results for input(s): DDIMER in the last 168 hours.   Radiology    Dg Chest Port 1 View  Result Date: 05/26/2018 CLINICAL DATA:  History of CABG. EXAM: PORTABLE CHEST 1 VIEW COMPARISON:   05/25/2018 FINDINGS: Postsurgical changes from CABG. Mediastinal/thoracic drains have been removed. The right IJ venous sheath remains. Mildly enlarged cardiac silhouette. Bilateral pleural effusions. Bibasilar atelectasis versus airspace consolidation. IMPRESSION: Bilateral pleural effusions and bibasilar atelectasis versus airspace consolidation. Electronically Signed   By: Fidela Salisbury M.D.   On: 05/26/2018 09:06   Cardiac Studies   Left Heart Cath 05/22/2018   Previously placed Prox LAD to Mid LAD stent (unknown type) is widely patent.  1st Mrg lesion is 70% stenosed.  Prox RCA to Mid RCA lesion is 75% stenosed.  Prox Cx lesion is 100% stenosed.  Ost LAD to Prox LAD lesion is 75% stenosed.  Mid LAD lesion is 50% stenosed.  LV end diastolic pressure is normal.  There is no aortic valve stenosis.  TEE 05/24/2018  Left Ventricle: The left ventricle has low normal systolic function, with an ejection fraction of 50-55%. The cavity size was normal. There is moderate concentric left ventricular hypertrophy. No evidence of left ventricular regional wall motion  abnormalities. Right Ventricle: The right ventricle has normal systolic function. The cavity was normal. There is no increase in right ventricular wall thickness. Left Atrium: Left atrial size was normal in size. The left atrial appendage is well visualized and there is no evidence of thrombus present. Right Atrium: Right atrial size was normal in size. Right atrial pressure is estimated at 10 mmHg. Interatrial Septum: Evidence of atrial level shunting detected by color flow Doppler. Agitated saline contrast was given intravenously to evaluate for intracardiac shunting. Saline contrast bubble study was positive, with evidence of interatrial  shunting. A small patent foramen ovale is detected with predominantly left to right shunting across the atrial septum. Pericardium: There is no evidence of pericardial effusion. Mitral  Valve: The mitral valve is normal in structure. Mitral valve regurgitation is mild by color flow Doppler. Tricuspid Valve: The tricuspid valve was normal in structure. Tricuspid valve regurgitation was not visualized by color flow Doppler. Aortic Valve: The aortic valve is normal in structure. Aortic valve regurgitation was not visualized by color flow Doppler. There is no stenosis of the aortic valve. Pulmonic Valve: The pulmonic valve was normal in structure. Pulmonic valve regurgitation is not visualized by color flow  Doppler. Aorta: The aortic root, ascending aorta and aortic arch are normal in size and structure.  Patient Profile     Mr.Montgomeryis a 78 y.o.malewith known CAD status post drug-eluting stent to the mid and proximal LAD in 2016 who presented to the emergency department with severe substernal chest pressure.  Assessment & Plan    CAD s/p CABG 08/03/8880 - Post-op complicated by A-fib. Currently in NSR on amiodarone  - Continue ASA and metoprolol tartrate 25 mg BID - Continue Imdur  - Continue pravastatin  - Not on lisinopril due to angioedema   PAF s/p CABG  - Currently in NSR  - On amiodarone   CKD Stage II-III - Creatinine at baseline   Hypertension  - Continue Metoprolol Tartrate 25 mg BID   HLD - Continue pravastatin   Will discuss case further with Dr. Irish Lack.   For questions or updates, please contact [facility] Please consult [email or web address] for contact info under Cardiology/STEMI.   Signed, Ina Homes, MD  05/27/2018, 8:53 AM    I have examined the patient and reviewed assessment and plan and discussed with patient.  Agree with above as stated.  Amio for AFib.  Will hopefully not be a longterm medicine.   Larae Grooms

## 2018-05-27 NOTE — Progress Notes (Signed)
3 Days Post-Op Procedure(s) (LRB): CORONARY ARTERY BYPASS GRAFTING (CABG), ON PUMP, TIMES THREE, USING BILATERAL INTERNAL MAMMARY ARTERY AND HARVESTED LEFT RADIAL ARTERY (N/A) RADIAL ARTERY HARVEST (Left) TRANSESOPHAGEAL ECHOCARDIOGRAM (TEE) (N/A) Subjective: Feels better today. Walked a little this am but legs felt weak. He did not walk much yesterday due to atrial fib.  Objective: Vital signs in last 24 hours: Temp:  [98.2 F (36.8 C)-98.7 F (37.1 C)] 98.7 F (37.1 C) (02/24 0803) Pulse Rate:  [75-130] 120 (02/24 0800) Cardiac Rhythm: Atrial fibrillation (02/24 0800) Resp:  [10-28] 19 (02/24 0800) BP: (96-150)/(57-97) 136/86 (02/24 0800) SpO2:  [90 %-95 %] 94 % (02/24 0800) Weight:  [93.4 kg] 93.4 kg (02/24 0500)  Hemodynamic parameters for last 24 hours:    Intake/Output from previous day: 02/23 0701 - 02/24 0700 In: 1739.4 [P.O.:620; I.V.:1119.4] Out: 2210 [Urine:2210] Intake/Output this shift: Total I/O In: 16.7 [I.V.:16.7] Out: -   General appearance: alert and cooperative Neurologic: intact Heart: regular rate and rhythm, S1, S2 normal, no murmur, click, rub or gallop Lungs: clear to auscultation bilaterally Extremities: edema mild Wound: dressings dry  Lab Results: Recent Labs    05/26/18 0400 05/27/18 0424  WBC 6.9 7.3  HGB 8.1* 8.7*  HCT 25.9* 27.5*  PLT 115* 124*   BMET:  Recent Labs    05/26/18 0400 05/27/18 0424  NA 135 134*  K 4.1 3.9  CL 106 103  CO2 21* 24  GLUCOSE 117* 189*  BUN 21 23  CREATININE 1.15 1.35*  CALCIUM 7.6* 7.8*    PT/INR:  Recent Labs    05/24/18 1530  LABPROT 16.6*  INR 1.36   ABG    Component Value Date/Time   PHART 7.370 05/24/2018 2047   HCO3 22.0 05/24/2018 2047   TCO2 23 05/24/2018 2047   ACIDBASEDEF 3.0 (H) 05/24/2018 2047   O2SAT 99.0 05/24/2018 2047   CBG (last 3)  Recent Labs    05/26/18 1639 05/26/18 2217 05/27/18 0648  GLUCAP 148* 141* 147*    Assessment/Plan: S/P Procedure(s)  (LRB): CORONARY ARTERY BYPASS GRAFTING (CABG), ON PUMP, TIMES THREE, USING BILATERAL INTERNAL MAMMARY ARTERY AND HARVESTED LEFT RADIAL ARTERY (N/A) RADIAL ARTERY HARVEST (Left) TRANSESOPHAGEAL ECHOCARDIOGRAM (TEE) (N/A)  POD 3  Hemodynamically stable   Postop atrial fib with RVR: started on amiodarone IV. He is in sinus at 80 this am so will start oral amio and DC drip 2 hrs later. Continue Lopressor. Check ECG tomorrow for intervals.  Expected acute postop blood loss anemia: Hgb improving. Continue iron.  Mild volume excess. He is about 5 lbs over preop. Diurese and replace K+  DM: preop Hgb A1c was 6.5. No hx of DM. Will continue Levemir and SSI for now and decide about need for oral med vs diet modification alone.   Hyperlipidemia: on statin.  Continue Imdur for radial artery graft for 1 month.   Continue IS and ambulation.  Plan transfer to 4E if he maintains sinus on oral amio.   LOS: 6 days    Gaye Pollack 05/27/2018

## 2018-05-27 NOTE — Telephone Encounter (Signed)
[name], please order and also check on status of Haegarda delivery and use.

## 2018-05-27 NOTE — Telephone Encounter (Signed)
Spoke to patient daughter to take Haegarda today to [facility] and message to Dr Tarri Abernethy regarding medication and dosing.  I advised him that patient may need help with infusing since this will be his first time with medication since changing from [facility] to [facility]

## 2018-05-27 NOTE — Telephone Encounter (Signed)
Daniel Herrera, I am the biological coordinator for the [facility] and I know you had previously talked with Dr. Neldon Mc regarding Daniel Herrera.  I just wanted to clarify Herrera's medication for his HAE.  He has been taking Firazyr for his acute attacks and I have given his daughter info for specialty pharmacy to reorder same.  His long term prophylaxis therapy was Asencion Islam until this year his insurance changed and he has to use Haegarda.  His first dose of Haegarda was delivered last week and nursing had not came out to instruct him on self infusing yet due to his hospitalization.  He will probably need help by nursing to infuse his new therapy subq since it is little different from the [facility].  His dose should be 5400 units every 3 days which will be around 62ml subq.  I have spoken to his daughter and she is planning on bringing same to the hospital later this afternoon.  If you can get Herrera started as soon as possible this may stop his acute attacks he has been experiencing since last week. If nursing has any questions regarding the infusion I can put them in contact with [facility].  If you have any questions feel free to contact me direct at [phone].   Arboriculturist

## 2018-05-27 NOTE — Telephone Encounter (Signed)
Patient has received his Haegarda last week to start but he had heart attack last week and open heart surgery and has been unable to start.  I did give number to daughter to refill his Daniel Herrera since she advised he has used 2 injections since his surgery.

## 2018-05-27 NOTE — Telephone Encounter (Signed)
Please inform patient that he needs to start [place], ASAP. Please make this happen today.

## 2018-05-27 NOTE — Progress Notes (Signed)
[facility] BRIEF SICU PROGRESS NOTE  3 Days Post-Op  S/P Procedure(s) (LRB): CORONARY ARTERY BYPASS GRAFTING (CABG), ON PUMP, TIMES THREE, USING BILATERAL INTERNAL MAMMARY ARTERY AND HARVESTED LEFT RADIAL ARTERY (N/A) RADIAL ARTERY HARVEST (Left) TRANSESOPHAGEAL ECHOCARDIOGRAM (TEE) (N/A)   Stable day Maintaining NSR Breathing comfortably on room air  Plan: Continue current plan  Rexene Alberts, MD 05/27/2018 6:28 PM

## 2018-05-27 NOTE — Telephone Encounter (Signed)
Thank you. I am going to pass your information along to pharmacy in case they have questions.

## 2018-05-27 NOTE — Progress Notes (Signed)
Spoke with Dr. Neldon Mc today. Patient with hereditary angioedema. Unfortunately he has not received the Haegarda in the mail. He does still have Takhzryo at home.   After speaking with Dr. Neldon Mc the patient will have his [employer] brought to the hospital and take this today. In the setting of an acute angioedema episode he should get Firazyr.

## 2018-05-28 DIAGNOSIS — Z951 Presence of aortocoronary bypass graft: Secondary | ICD-10-CM

## 2018-05-28 HISTORY — DX: Presence of aortocoronary bypass graft: Z95.1

## 2018-05-28 LAB — BASIC METABOLIC PANEL
Anion gap: 5 (ref 5–15)
BUN: 21 mg/dL (ref 8–23)
CO2: 24 mmol/L (ref 22–32)
Calcium: 7.8 mg/dL — ABNORMAL LOW (ref 8.9–10.3)
Chloride: 106 mmol/L (ref 98–111)
Creatinine, Ser: 1.19 mg/dL (ref 0.61–1.24)
GFR calc Af Amer: >60 mL/min (ref >60)
GFR calc non Af Amer: 58 mL/min — ABNORMAL LOW (ref >60)
GLUCOSE: 114 mg/dL — AB (ref 70–99)
Potassium: 3.5 mmol/L (ref 3.5–5.1)
Sodium: 135 mmol/L (ref 135–145)

## 2018-05-28 LAB — GLUCOSE, CAPILLARY
Glucose-Capillary: 116 mg/dL — ABNORMAL HIGH (ref 70–99)
Glucose-Capillary: 157 mg/dL — ABNORMAL HIGH (ref 70–99)
Glucose-Capillary: 135 mg/dL — ABNORMAL HIGH (ref 70–99)
Glucose-Capillary: 118 mg/dL — ABNORMAL HIGH (ref 70–99)

## 2018-05-28 MED ORDER — SODIUM CHLORIDE 0.9% FLUSH
3.0000 mL | Freq: Two times a day (BID) | INTRAVENOUS | Status: DC
  Administered 2018-05-28 – 2018-05-30 (×4): 3 mL via INTRAVENOUS

## 2018-05-28 MED ORDER — ONDANSETRON HCL 4 MG/2ML IJ SOLN
4.0000 mg | Freq: Four times a day (QID) | INTRAMUSCULAR | Status: DC | PRN
  Administered 2018-05-29: 4 mg via INTRAVENOUS
  Filled 2018-05-28: qty 2

## 2018-05-28 MED ORDER — METOPROLOL TARTRATE 25 MG PO TABS
25.0000 mg | ORAL_TABLET | Freq: Two times a day (BID) | ORAL | Status: DC
  Administered 2018-05-28 – 2018-05-30 (×4): 25 mg via ORAL
  Filled 2018-05-28 (×4): qty 1

## 2018-05-28 MED ORDER — ONDANSETRON HCL 4 MG PO TABS: 4.0000 mg | ORAL_TABLET | Freq: Four times a day (QID) | ORAL | Status: DC | PRN

## 2018-05-28 MED ORDER — INSULIN ASPART 100 UNIT/ML ~~LOC~~ SOLN
0.0000 [IU] | Freq: Three times a day (TID) | SUBCUTANEOUS | Status: DC
  Administered 2018-05-28: 2 [IU] via SUBCUTANEOUS

## 2018-05-28 MED ORDER — MOVING RIGHT ALONG BOOK
Freq: Once | Status: DC
  Filled 2018-05-28: qty 1

## 2018-05-28 MED ORDER — ASPIRIN EC 325 MG PO TBEC
325.0000 mg | DELAYED_RELEASE_TABLET | Freq: Every day | ORAL | Status: DC
  Administered 2018-05-29 – 2018-05-30 (×2): 325 mg via ORAL
  Filled 2018-05-28 (×2): qty 1

## 2018-05-28 MED ORDER — SODIUM CHLORIDE 0.9 % IV SOLN: 250.0000 mL | INTRAVENOUS | Status: DC | PRN

## 2018-05-28 MED ORDER — TRAMADOL HCL 50 MG PO TABS
50.0000 mg | ORAL_TABLET | Freq: Four times a day (QID) | ORAL | Status: DC | PRN
  Administered 2018-05-29: 50 mg via ORAL
  Filled 2018-05-28: qty 1

## 2018-05-28 MED ORDER — ACETAMINOPHEN 325 MG PO TABS: 650.0000 mg | ORAL_TABLET | Freq: Four times a day (QID) | ORAL | Status: DC | PRN

## 2018-05-28 MED ORDER — SODIUM CHLORIDE 0.9% FLUSH: 3.0000 mL | INTRAVENOUS | Status: DC | PRN

## 2018-05-28 MED ORDER — POTASSIUM CHLORIDE CRYS ER 20 MEQ PO TBCR: 20.0000 meq | EXTENDED_RELEASE_TABLET | Freq: Once | ORAL | Status: AC

## 2018-05-28 MED ORDER — PANTOPRAZOLE SODIUM 40 MG PO TBEC
40.0000 mg | DELAYED_RELEASE_TABLET | Freq: Every day | ORAL | Status: DC
  Administered 2018-05-29 – 2018-05-30 (×2): 40 mg via ORAL
  Filled 2018-05-28 (×2): qty 1

## 2018-05-28 MED FILL — Magnesium Sulfate Inj 50%: INTRAMUSCULAR | Qty: 10 | Status: AC

## 2018-05-28 MED FILL — Sodium Bicarbonate IV Soln 8.4%: INTRAVENOUS | Qty: 50 | Status: AC

## 2018-05-28 MED FILL — Mannitol IV Soln 20%: INTRAVENOUS | Qty: 500 | Status: AC

## 2018-05-28 MED FILL — Potassium Chloride Inj 2 mEq/ML: INTRAVENOUS | Qty: 40 | Status: AC

## 2018-05-28 MED FILL — Heparin Sodium (Porcine) Inj 1000 Unit/ML: INTRAMUSCULAR | Qty: 10 | Status: AC

## 2018-05-28 MED FILL — Sodium Chloride IV Soln 0.9%: INTRAVENOUS | Qty: 3000 | Status: AC

## 2018-05-28 MED FILL — Electrolyte-R (PH 7.4) Solution: INTRAVENOUS | Qty: 3000 | Status: AC

## 2018-05-28 MED FILL — Heparin Sodium (Porcine) Inj 1000 Unit/ML: INTRAMUSCULAR | Qty: 30 | Status: AC

## 2018-05-28 MED FILL — Lidocaine HCl(Cardiac) IV PF Soln Pref Syr 100 MG/5ML (2%): INTRAVENOUS | Qty: 5 | Status: AC

## 2018-05-28 NOTE — Progress Notes (Signed)
4 Days Post-Op Procedure(s) (LRB): CORONARY ARTERY BYPASS GRAFTING (CABG), ON PUMP, TIMES THREE, USING BILATERAL INTERNAL MAMMARY ARTERY AND HARVESTED LEFT RADIAL ARTERY (N/A) RADIAL ARTERY HARVEST (Left) TRANSESOPHAGEAL ECHOCARDIOGRAM (TEE) (N/A) Subjective: No complaints  Objective: Vital signs in last 24 hours: Temp:  [98.2 F (36.8 C)-98.7 F (37.1 C)] 98.2 F (36.8 C) (02/25 1113) Pulse Rate:  [76-118] 85 (02/25 1200) Cardiac Rhythm: Normal sinus rhythm (02/25 1200) Resp:  [7-25] 18 (02/25 1200) BP: (129-161)/(59-122) 140/83 (02/25 1200) SpO2:  [90 %-99 %] 96 % (02/25 1200) Weight:  [93 kg] 93 kg (02/25 0500)  Hemodynamic parameters for last 24 hours:    Intake/Output from previous day: 02/24 0701 - 02/25 0700 In: 66.7 [I.V.:66.7] Out: 1400 [Urine:1400] Intake/Output this shift: Total I/O In: -  Out: 200 [Urine:200]  General appearance: alert and cooperative Neurologic: intact Heart: regular rate and rhythm, S1, S2 normal, no murmur, click, rub or gallop Lungs: clear to auscultation bilaterally Extremities: extremities normal, atraumatic, no cyanosis or edema Wound: incisions ok  Lab Results: Recent Labs    05/26/18 0400 05/27/18 0424  WBC 6.9 7.3  HGB 8.1* 8.7*  HCT 25.9* 27.5*  PLT 115* 124*   BMET:  Recent Labs    05/27/18 0424 05/28/18 0500  NA 134* 135  K 3.9 3.5  CL 103 106  CO2 24 24  GLUCOSE 189* 114*  BUN 23 21  CREATININE 1.35* 1.19  CALCIUM 7.8* 7.8*    PT/INR: No results for input(s): LABPROT, INR in the last 72 hours. ABG    Component Value Date/Time   PHART 7.370 05/24/2018 2047   HCO3 22.0 05/24/2018 2047   TCO2 23 05/24/2018 2047   ACIDBASEDEF 3.0 (H) 05/24/2018 2047   O2SAT 99.0 05/24/2018 2047   CBG (last 3)  Recent Labs    05/27/18 2202 05/28/18 0657 05/28/18 1115  GLUCAP 140* 116* 157*    Assessment/Plan: S/P Procedure(s) (LRB): CORONARY ARTERY BYPASS GRAFTING (CABG), ON PUMP, TIMES THREE, USING BILATERAL  INTERNAL MAMMARY ARTERY AND HARVESTED LEFT RADIAL ARTERY (N/A) RADIAL ARTERY HARVEST (Left) TRANSESOPHAGEAL ECHOCARDIOGRAM (TEE) (N/A)  POD 4 Hemodynamically stable in sinus rhythm on amio and lopressor.  Postop atrial fib converted with amio.  Mild volume excess: continue lasix and KCL  DM: glucose under control. Will DC Levemir and continue SSI.  DC sleeve.   Transfer to 4E and continue mobilization.  Home in the next day or two if rhythm remains stable.   LOS: 7 days    Daniel Herrera 05/28/2018

## 2018-05-28 NOTE — Discharge Summary (Signed)
Physician Discharge Summary  Patient ID: Daniel Herrera MRN: [id number] DOB/AGE: 80-Jun-1942 80 y.o.  Admit date: 05/20/2018 Discharge date: 05/30/2018  Admission Diagnoses: Patient Active Problem List   Diagnosis Date Noted  . S/P CABG x 3 05/28/2018  . NSTEMI (non-ST elevated myocardial infarction) ([place]) 05/21/2018  . Tachycardia determined by examination of pulse   . Pre-operative clearance 01/02/2018  . Hematochezia 12/21/2017  . CKD (chronic kidney disease) stage 3, GFR 30-59 ml/min (HCC) 12/21/2017  . GI bleed 12/21/2017  . Hereditary angioedema type 2 ([place]) 07/28/2017  . Enterotoxigenic Escherichia coli infection 05/24/2015  . STEC (Shiga toxin-producing Escherichia coli) infection 05/24/2015  . Colitis 05/23/2015  . History of prostate cancer   . Chronic diarrhea of unknown origin   . CAD S/P percutaneous coronary angioplasty   . Type 2 diabetes mellitus with unspecified complications ([place])   . Chest pain 04/08/2015  . Dyspnea 04/08/2015  . Palpitations 03/15/2015  . History of non-ST elevation myocardial infarction (NSTEMI) 02/22/2015  . Abnormal EKG 02/21/2015  . Essential hypertension 02/21/2015  . Dyslipidemia, goal LDL below 70 02/21/2015  . Malignant neoplasm of prostate ([place]) 08/07/2014  . Acute interstitial pneumonitis ([place]) 12/17/2013    Discharge Diagnoses:  Patient Active Problem List   Diagnosis Date Noted  . S/P CABG x 3 05/28/2018  . NSTEMI (non-ST elevated myocardial infarction) ([place]) 05/21/2018  . Tachycardia determined by examination of pulse   . Pre-operative clearance 01/02/2018  . Hematochezia 12/21/2017  . CKD (chronic kidney disease) stage 3, GFR 30-59 ml/min (HCC) 12/21/2017  . GI bleed 12/21/2017  . Hereditary angioedema type 2 ([place]) 07/28/2017  . Enterotoxigenic Escherichia coli infection 05/24/2015  . STEC (Shiga toxin-producing Escherichia coli) infection 05/24/2015  . Colitis 05/23/2015  . History of prostate cancer   . Chronic  diarrhea of unknown origin   . CAD S/P percutaneous coronary angioplasty   . Type 2 diabetes mellitus with unspecified complications ([place])   . Chest pain 04/08/2015  . Dyspnea 04/08/2015  . Palpitations 03/15/2015  . History of non-ST elevation myocardial infarction (NSTEMI) 02/22/2015  . Abnormal EKG 02/21/2015  . Essential hypertension 02/21/2015  . Dyslipidemia, goal LDL below 70 02/21/2015  . Malignant neoplasm of prostate ([place]) 08/07/2014  . Acute interstitial pneumonitis ([place]) 12/17/2013    Discharged Condition:   History of Present Illness:   Patient is a 78 year old male with a known history of coronary artery disease status post drug-eluting stent to the mid and proximal LAD in 2016 who presented to the emergency department with severe substernal chest pressure.  On 2/17 at approximately 5 PM he began to experience substernal chest pain associated with palpitations.  He took his blood pressure and heart rate and they were both elevated.  Did not know the exact numbers.  He was brought to the emergency room for further evaluation.  The pain persisted for several hours and spontaneously resolved.  Troponins were noted to be elevated and peaked at 0.59. He was admitted for further evaluation treatment to include cardiology consultation.  It was felt he should undergo cardiac catheterization.  Study showed severe multivessel coronary artery disease with in-stent stenosis within the proximal LAD stent.  LV function was well preserved with EF of 60 to 65%.  We were asked to see in cardiothoracic surgical consultation for consideration of CABG.   Hospital Course:  Daniel Herrera remained stable over the next few days with no further chest pain.  He was taken to the operating room on 05/24/2018  where three-vessel coronary bypass grafting was accomplished.  Both the right and left internal mammary arteries were utilized as well as the left radial artery due to his history of bilateral lower  extremity venous ablation.  He tolerated the procedure well and separated from cardiopulmonary bypass on dopamine at renal dose only.  He was transferred to the ICU intubated and hemodynamically stable.  He was extubated routinely.  Vital signs and hemodynamics remained stable.  He developed atrial fibrillation on the first postoperative day and was started on amiodarone.  This resulted in conversion to back to sinus rhythm by the third postoperative day.  He was converted to oral amiodarone.  In addition he was maintained on oral Imdur postoperatively in order to optimize radial artery patency.  This should be continued for 30 days following surgery.  His glucose was monitored and sliding scale insulin was provided as appropriate.  On the fourth postoperative day, he was transferred to intermediate care on 4 E.  Diet and activity were advanced and well-tolerated.  He remains in NSR.  He requested home health nursing and PT which has been arranged.  His incisions are healing without evidence of infection.  He is medically stable for discharge home today.  Significant Diagnostic Studies:   Diagnostic  Dominance: Right  Left Anterior Descending  Ost LAD to Prox LAD lesion 75% stenosed  Ost LAD to Prox LAD lesion is 75% stenosed. The lesion was previously treated using a drug eluting stent over 2 years ago.  Prox LAD to Mid LAD lesion 0% stenosed  Previously placed Prox LAD to Mid LAD stent (unknown type) is widely patent.  Mid LAD lesion 50% stenosed  Mid LAD lesion is 50% stenosed.  Left Circumflex  Collaterals  Dist Cx filled by collaterals from Post Atrio.    Prox Cx lesion 100% stenosed  Prox Cx lesion is 100% stenosed.  First Obtuse Marginal Branch  1st Mrg lesion 70% stenosed  1st Mrg lesion is 70% stenosed.  Right Coronary Artery  Prox RCA to Mid RCA lesion 75% stenosed  Prox RCA to Mid RCA lesion is 75% stenosed.  Intervention   No interventions have been documented.  Left Heart    Left Ventricle LV end diastolic pressure is normal.  Aortic Valve There is no aortic valve stenosis.  Coronary Diagrams   Diagnostic  Dominance: Right       ECHOCARDIOGRAM REPORT       Patient Name:   Daniel Herrera Date of Exam: 05/22/2018 Medical Rec #:  [id number]          Height:       73.0 in Accession #:    [id number]         Weight:       203.7 lb Date of Birth:  [birth year]          BSA:          2.17 m Patient Age:    49 years           BP:           143/84 mmHg Patient Gender: M                  HR:           63 bpm. Exam Location:  Inpatient    Procedure: 2D Echo  Indications:    Chest Pain 786.50 / R07.9   History:        Patient has prior history of  Echocardiogram examinations, most                 recent 02/23/2015. CAD and NSTEMI, Palpitations; Signs/Symptoms:                 Dyspnea; Risk Factors: Diabetes and Hypertension. CKD.   Sonographer:    Talmage Coin Referring Phys: [place]    1. The left ventricle has normal systolic function with an ejection fraction of 60-65%. The cavity size was normal. Left ventricular diastolic parameters were normal.  2. The right ventricle has normal systolic function. The cavity was normal. There is no increase in right ventricular wall thickness.  3. The mitral valve is normal in structure. Mild thickening of the mitral valve leaflet.  4. The tricuspid valve is normal in structure.  5. The aortic valve is tricuspid Mild thickening of the aortic valve Mild calcification of the aortic valve.  6. The pulmonic valve was normal in structure.  7. Mild ascending aortic root dilatation 3.8 cm.  FINDINGS  Left Ventricle: The left ventricle has normal systolic function, with an ejection fraction of 60-65%. The cavity size was normal. There is no increase in left ventricular wall thickness. Left ventricular diastolic parameters were normal Right Ventricle: The right ventricle has normal  systolic function. The cavity was normal. There is no increase in right ventricular wall thickness.     Left Atrium: left atrial size was normal in size Right Atrium: right atrial size was normal in size.   Interatrial Septum: No atrial level shunt detected by color flow Doppler. Pericardium: There is no evidence of pericardial effusion. Mitral Valve: The mitral valve is normal in structure. Mild thickening of the mitral valve leaflet. Mitral valve regurgitation is trivial by color flow Doppler. Tricuspid Valve: The tricuspid valve is normal in structure. Tricuspid valve regurgitation was not visualized by color flow Doppler. Aortic Valve: The aortic valve is tricuspid Mild thickening of the aortic valve and Mild calcification of the aortic valve Aortic valve regurgitation was not visualized by color flow Doppler. There is no evidence of aortic valve stenosis. Pulmonic Valve: The pulmonic valve was normal in structure. Pulmonic valve regurgitation is not visualized by color flow Doppler. Aorta: Mild ascending aortic root dilatation 3.8 cm. Venous: The inferior vena cava is normal in size with greater than 50% respiratory variability.   LEFT VENTRICLE PLAX 2D (Te[place]) LV EF:          69.2 %   Diastology LVIDd:          4.40 cm  LV e' lateral:   7.05 cm/s LVIDs:          2.70 cm  LV E/e' lateral: 9.5 LV PW:          1.10 cm  LV e' medial:    5.19 cm/s LV IVS:         1.10 cm  LV E/e' medial:  12.9 LVOT diam:      2.30 cm LV SV:          61 ml LVOT Area:      4.15 cm  RIGHT VENTRICLE RV S prime:     11.70 cm/s TAPSE (M-mode): 1.6 cm  LEFT ATRIUM             Index       RIGHT ATRIUM           Index LA diam:        3.60 cm 1.66 cm/m  RA Area:     13.90 cm LA Vol (A2C):   63.6 ml 29.33 ml/m RA Volume:   31.60 ml  14.57 ml/m LA Vol (A4C):   42.7 ml 19.69 ml/m LA Biplane Vol: 51.8 ml 23.89 ml/m  AORTIC VALVE LVOT Vmax:   86.80 cm/s LVOT Vmean:  56.100 cm/s LVOT VTI:    0.202  m   AORTA Ao Root diam: 3.10 cm  MITRAL VALVE MV Area (PHT): 2.24 cm MV PHT:        98.02 msec MV Decel Time: 338 msec MV E velocity: 66.80 cm/s MV A velocity: 87.40 cm/s MV E/A ratio:  0.76    Jenkins Rouge MD Electronically signed by Jenkins Rouge MD Signature Date/Time: 05/22/2018/1:12:00 PM    Treatments: Surgery  CARDIOVASCULAR SURGERY OPERATIVE NOTE  05/24/2018  Surgeon:  Gaye Pollack, MD  First Assistant: Ellwood Handler,  PA-C   Preoperative Diagnosis:  Severe multi-vessel coronary artery disease   Postoperative Diagnosis:  Same   Procedure:  1. Median Sternotomy 2. Extracorporeal circulation 3.   Coronary artery bypass grafting x 3   Left internal mammary artery graft to the LAD  Right internal mammary artery graft to the RCA  Left radial artery graft to the OM   Discharge Exam: Blood pressure 131/76, pulse 81, temperature 98.2 F (36.8 C), temperature source Oral, resp. rate 18, height 6\' 1"  (1.854 m), weight 90.9 kg, SpO2 96 %.  General appearance: alert, cooperative and no distress Heart: regular rate and rhythm Lungs: clear to auscultation bilaterally Abdomen: soft, non-tender; bowel sounds normal; no masses,  no organomegaly Extremities: extremities normal, atraumatic, no cyanosis or edema Wound: clean and dry, staples in place of LUE, ecchymosis present  Discharge disposition: 01-Home or Self Care    Discharge Medications: Allergies as of 05/30/2018      Reactions   Lisinopril Swelling   Facial and lip swelling. And throat swelling.    Benadryl [diphenhydramine Hcl] Swelling   Facial swelling from high dose (tolerates Advil PM as needed)   Darvocet [propoxyphene N-acetaminophen] Other (See Comments)   hallucination   Darvon [propoxyphene Hcl] Other (See Comments)   Hallucination   Tylenol [acetaminophen] Swelling   Foot swelling      Medication List    TAKE these medications   acetaminophen 325 MG  tablet Commonly known as:  TYLENOL Take 2 tablets (650 mg total) by mouth every 6 (six) hours as needed for mild pain.   amiodarone 200 MG tablet Commonly known as:  PACERONE Take 1 tablet (200 mg total) by mouth 2 (two) times daily. For 7 days, then decrease to 200 mg daily   aspirin EC 81 MG tablet Take 1 tablet (81 mg total) by mouth daily.   azelastine 0.1 % nasal spray Commonly known as:  ASTELIN Place 1 spray into both nostrils 2 (two) times daily.   Cetirizine HCl 10 MG Caps Commonly known as:  ZYRTEC ALLERGY Take 1 capsule (10 mg total) by mouth at bedtime and may repeat dose one time if needed.   EPINEPHrine 0.3 mg/0.3 mL Soaj injection Commonly known as:  EPI-PEN Inject 0.3 mLs (0.3 mg total) into the muscle as needed (for allergic reaction).   fluticasone 50 MCG/ACT nasal spray Commonly known as:  FLONASE Place 1 spray into both nostrils daily.   HAEGARDA 3000 units Solr Generic drug:  C1 Esterase Inhibitor (Human) Inject 5,500 Units into the skin as directed. Every 2-5 days   isosorbide mononitrate 30 MG 24 hr  tablet Commonly known as:  IMDUR Take 1 tablet (30 mg total) by mouth daily.   ketoconazole 2 % shampoo Commonly known as:  NIZORAL Apply 1 application topically 2 (two) times daily.   levothyroxine 75 MCG tablet Commonly known as:  SYNTHROID, LEVOTHROID Take 75 mcg by mouth daily before breakfast.   LIVALO 4 MG Tabs Generic drug:  Pitavastatin Calcium Take 4 mg by mouth at bedtime.   metoprolol tartrate 25 MG tablet Commonly known as:  LOPRESSOR Take 1 tablet (25 mg total) by mouth 2 (two) times daily.   montelukast 10 MG tablet Commonly known as:  SINGULAIR Take 1 tablet (10 mg total) by mouth at bedtime.   nabumetone 750 MG tablet Commonly known as:  RELAFEN Take 1 tablet by mouth daily.   naproxen 375 MG tablet Commonly known as:  NAPROSYN Take 1 tablet (375 mg total) by mouth 2 (two) times daily.   nitroGLYCERIN 0.4 MG SL  tablet Commonly known as:  NITROSTAT DISSOLVE 1 TABLET UNDER TONGUE EVERY 5 MINUTES FOR 3 DOSES AS NEEDED CHEST PAIN. IF NO RELIEF CALL 911 What changed:  See the new instructions.   pantoprazole 40 MG tablet Commonly known as:  PROTONIX Take 1 tablet (40 mg total) by mouth 2 (two) times daily.   polyethylene glycol packet Commonly known as:  MIRALAX Take 17 g by mouth daily.   SYSTANE OP Place 1 drop into both eyes 2 (two) times daily.   traMADol 50 MG tablet Commonly known as:  ULTRAM Take 1 tablet (50 mg total) by mouth every 6 (six) hours as needed for moderate pain.   triamcinolone cream 0.5 % Commonly known as:  KENALOG Apply 1 application topically daily.   UNABLE TO FIND Takhzryo injected into the skin every 2 weeks      Follow-up Information    [facility] [place] Follow up on 06/06/2018.   Specialty:  Cardiothoracic Surgery Why:  Nurse visit for staple removal has been scheduled for Thursday, June 06, 2018 at 10:00am. Contact information: [place], [place] [phone]       [facility] Follow up on 06/24/2018.   Specialty:  Cardiothoracic Surgery Why:  Post-operative follow up has been scheduled for Monday 06/24/18 at 1:30pm.  Please arrive at 1:00pm for a chest X-ray to be done on the first floor of the same building. Contact information: [place], [place] [phone]       Erlene Quan, PA-C Follow up on 06/13/2018.   Specialties:  Cardiology, Radiology Why:  Cardiology follow up has been scheduled for Thursday 06/13/18 at 3:00pm. Contact information: [place] [address] [phone]          The patient has been discharged on:   1.Beta Blocker:  Yes [ X  ]                              No   [   ]                              If No,  reason:  2.Ace Inhibitor/ARB: Yes [   ]  No  [ X   ]                                     If No, reason:  3.Statin:   Yes [ X  ]                  No  [   ]                  If No, reason:  4.Ecasa:  Yes  [ X  ]                  No   [   ]                  If No, reason:    Signed:  Original note by Enid Cutter PA- C  Update by: Ellwood Handler, PA-C 05/30/2018, 8:56 AM

## 2018-05-28 NOTE — Discharge Instructions (Signed)

## 2018-05-28 NOTE — Progress Notes (Signed)
Patient ID: Daniel Herrera, male   DOB: 01-04-1941, 78 y.o.   MRN: [id number] EVENING ROUNDS NOTE :     [address]       [place] [address]             [phone]                 4 Days Post-Op Procedure(s) (LRB): CORONARY ARTERY BYPASS GRAFTING (CABG), ON PUMP, TIMES THREE, USING BILATERAL INTERNAL MAMMARY ARTERY AND HARVESTED LEFT RADIAL ARTERY (N/A) RADIAL ARTERY HARVEST (Left) TRANSESOPHAGEAL ECHOCARDIOGRAM (TEE) (N/A)  Total Length of Stay:  LOS: 7 days  BP 125/71   Pulse 78   Temp 97.8 F (36.6 C) (Oral)   Resp 17   Ht 6\' 1"  (1.854 m)   Wt 93 kg   SpO2 94%   BMI 27.06 kg/m   .Intake/Output      02/24 0701 - 02/25 0700 02/25 0701 - 02/26 0700   P.O.     I.V. (mL/kg) 66.7 (0.7)    IV Piggyback     Total Intake(mL/kg) 66.7 (0.7)    Urine (mL/kg/hr) 1400 (0.6) 650 (0.6)   Total Output 1400 650   Net -1333.3 -650          . sodium chloride       Lab Results  Component Value Date   WBC 7.3 05/27/2018   HGB 8.7 (L) 05/27/2018   HCT 27.5 (L) 05/27/2018   PLT 124 (L) 05/27/2018   GLUCOSE 114 (H) 05/28/2018   CHOL 118 05/24/2015   TRIG 324 (H) 07/31/2017   HDL 32 (L) 05/24/2015   LDLCALC 48 05/24/2015   ALT 24 12/21/2017   AST 29 12/21/2017   NA 135 05/28/2018   K 3.5 05/28/2018   CL 106 05/28/2018   CREATININE 1.19 05/28/2018   BUN 21 05/28/2018   CO2 24 05/28/2018   TSH 3.205 05/21/2018   INR 1.36 05/24/2018   HGBA1C 6.5 (H) 05/22/2018   Stable waiting for transfer to progressive care Stable day   Grace Isaac MD  Beeper [phone] Office [phone] 05/28/2018 6:55 PM

## 2018-05-29 LAB — GLUCOSE, CAPILLARY
Glucose-Capillary: 130 mg/dL — ABNORMAL HIGH (ref 70–99)
Glucose-Capillary: 128 mg/dL — ABNORMAL HIGH (ref 70–99)

## 2018-05-29 NOTE — Progress Notes (Signed)
5 Days Post-Op Procedure(s) (LRB): CORONARY ARTERY BYPASS GRAFTING (CABG), ON PUMP, TIMES THREE, USING BILATERAL INTERNAL MAMMARY ARTERY AND HARVESTED LEFT RADIAL ARTERY (N/A) RADIAL ARTERY HARVEST (Left) TRANSESOPHAGEAL ECHOCARDIOGRAM (TEE) (N/A) Subjective: Does not feel as well today as yesterday but did not sleep as well.  Objective: Vital signs in last 24 hours: Temp:  [97.8 F (36.6 C)-99.1 F (37.3 C)] 99.1 F (37.3 C) (02/26 0732) Pulse Rate:  [76-94] 78 (02/26 0500) Cardiac Rhythm: Normal sinus rhythm (02/26 0600) Resp:  [12-25] 17 (02/26 0600) BP: (110-160)/(59-90) 127/67 (02/26 0600) SpO2:  [90 %-99 %] 95 % (02/26 0600) Weight:  [91.3 kg] 91.3 kg (02/26 0500)  Hemodynamic parameters for last 24 hours:    Intake/Output from previous day: 02/25 0701 - 02/26 0700 In: -  Out: 750 [Urine:750] Intake/Output this shift: No intake/output data recorded.  General appearance: alert and cooperative Heart: regular rate and rhythm, S1, S2 normal, no murmur, click, rub or gallop Lungs: clear to auscultation bilaterally Extremities: extremities normal, atraumatic, no cyanosis or edema Wound: incisions ok  Lab Results: Recent Labs    05/27/18 0424  WBC 7.3  HGB 8.7*  HCT 27.5*  PLT 124*   BMET:  Recent Labs    05/27/18 0424 05/28/18 0500  NA 134* 135  K 3.9 3.5  CL 103 106  CO2 24 24  GLUCOSE 189* 114*  BUN 23 21  CREATININE 1.35* 1.19  CALCIUM 7.8* 7.8*    PT/INR: No results for input(s): LABPROT, INR in the last 72 hours. ABG    Component Value Date/Time   PHART 7.370 05/24/2018 2047   HCO3 22.0 05/24/2018 2047   TCO2 23 05/24/2018 2047   ACIDBASEDEF 3.0 (H) 05/24/2018 2047   O2SAT 99.0 05/24/2018 2047   CBG (last 3)  Recent Labs    05/28/18 2138 05/29/18 0648 05/29/18 0733  GLUCAP 118* 130* 128*    Assessment/Plan: S/P Procedure(s) (LRB): CORONARY ARTERY BYPASS GRAFTING (CABG), ON PUMP, TIMES THREE, USING BILATERAL INTERNAL MAMMARY ARTERY  AND HARVESTED LEFT RADIAL ARTERY (N/A) RADIAL ARTERY HARVEST (Left) TRANSESOPHAGEAL ECHOCARDIOGRAM (TEE) (N/A)  Hemodynamically stable in sinus rhythm. Continue amio and Lopressor. Will decrease dose to 200 bid at discharge for a week and then 200 daily.  DM: no hx but preop Hgb A1c was 6.5. He needs dietary education and modification and hopefully can be controlled with medication. Will need to follow up with his PCP.  Wt is below preop and no edema. Stop diuretic.   Awaiting bed on 4E. Plan home tomorrow with his wife. I would keep the staples in his arm and I will see in the office next week to remove.  Continue Imdur for one month for radial artery graft.   LOS: 8 days    Gaye Pollack 05/29/2018

## 2018-05-29 NOTE — Anesthesia Postprocedure Evaluation (Signed)
Anesthesia Post Note  Patient: Daniel Herrera  Procedure(s) Performed: CORONARY ARTERY BYPASS GRAFTING (CABG), ON PUMP, TIMES THREE, USING BILATERAL INTERNAL MAMMARY ARTERY AND HARVESTED LEFT RADIAL ARTERY (N/A Chest) RADIAL ARTERY HARVEST (Left Arm Lower) TRANSESOPHAGEAL ECHOCARDIOGRAM (TEE) (N/A )     Patient location during evaluation: SICU Anesthesia Type: General Level of consciousness: sedated Pain management: pain level controlled Vital Signs Assessment: post-procedure vital signs reviewed and stable Respiratory status: patient remains intubated per anesthesia plan Cardiovascular status: stable Postop Assessment: no apparent nausea or vomiting Anesthetic complications: no    Last Vitals:  Vitals:   05/29/18 0600 05/29/18 0732  BP: 127/67   Pulse:    Resp: 17   Temp:  37.3 C  SpO2: 95%     Last Pain:  Vitals:   05/29/18 0732  TempSrc: Oral  PainSc:                  [place] S

## 2018-05-29 NOTE — Progress Notes (Signed)
Pt arrived to 4E-26 from [facility] via wheelchair. Pt walked to chair. Pt given CHG bath. Tele applied, CCMD notified. Pt oriented to call bell, room, and bed. Call bell within reach. Pt tray ordered. VSS. Will continue to monitor.  Amanda Cockayne, RN

## 2018-05-30 ENCOUNTER — Other Ambulatory Visit (HOSPITAL_COMMUNITY): Payer: Self-pay | Admitting: Physician Assistant

## 2018-05-30 DIAGNOSIS — Z951 Presence of aortocoronary bypass graft: Secondary | ICD-10-CM

## 2018-05-30 MED ORDER — AMIODARONE HCL 200 MG PO TABS: 200.0000 mg | ORAL_TABLET | Freq: Two times a day (BID) | ORAL | 1 refills | Status: DC

## 2018-05-30 MED ORDER — ACETAMINOPHEN 325 MG PO TABS: 650.0000 mg | ORAL_TABLET | Freq: Four times a day (QID) | ORAL | Status: DC | PRN

## 2018-05-30 MED ORDER — ISOSORBIDE MONONITRATE ER 30 MG PO TB24: 30.0000 mg | ORAL_TABLET | Freq: Every day | ORAL | 0 refills | Status: DC

## 2018-05-30 MED ORDER — TRAMADOL HCL 50 MG PO TABS: 50.0000 mg | ORAL_TABLET | Freq: Four times a day (QID) | ORAL | 0 refills | Status: DC | PRN

## 2018-05-30 NOTE — Progress Notes (Signed)
CARDIAC REHAB PHASE I   Pt has been walking independently with RW, which he has at home. Ed completed with pt and family. Will refer to [place], ACSM 05/30/2018 1:52 PM

## 2018-05-30 NOTE — Progress Notes (Signed)
      [address]       [place] [address]             [phone]      6 Days Post-Op Procedure(s) (LRB): CORONARY ARTERY BYPASS GRAFTING (CABG), ON PUMP, TIMES THREE, USING BILATERAL INTERNAL MAMMARY ARTERY AND HARVESTED LEFT RADIAL ARTERY (N/A) RADIAL ARTERY HARVEST (Left) TRANSESOPHAGEAL ECHOCARDIOGRAM (TEE) (N/A)   Subjective:  Patient doing okay. Complains of headache which has been present since surgery.  He states its a little worse since surgery.  Objective: Vital signs in last 24 hours: Temp:  [98 F (36.7 C)-98.8 F (37.1 C)] 98.2 F (36.8 C) (02/27 0737) Pulse Rate:  [72-89] 81 (02/27 0737) Cardiac Rhythm: Normal sinus rhythm (02/27 0700) Resp:  [12-19] 18 (02/27 0737) BP: (122-154)/(73-88) 131/76 (02/27 0737) SpO2:  [93 %-97 %] 96 % (02/27 0737) Weight:  [90.9 kg] 90.9 kg (02/27 0343)  Intake/Output from previous day: 02/26 0701 - 02/27 0700 In: 1200 [P.O.:1200] Out: 850 [Urine:850]  General appearance: alert, cooperative and no distress Heart: regular rate and rhythm Lungs: clear to auscultation bilaterally Abdomen: soft, non-tender; bowel sounds normal; no masses,  no organomegaly Extremities: extremities normal, atraumatic, no cyanosis or edema Wound: clean and dry, staples in place of LUE, ecchymosis present  Lab Results: No results for input(s): WBC, HGB, HCT, PLT in the last 72 hours. BMET:  Recent Labs    05/28/18 0500  NA 135  K 3.5  CL 106  CO2 24  GLUCOSE 114*  BUN 21  CREATININE 1.19  CALCIUM 7.8*    PT/INR: No results for input(s): LABPROT, INR in the last 72 hours. ABG    Component Value Date/Time   PHART 7.370 05/24/2018 2047   HCO3 22.0 05/24/2018 2047   TCO2 23 05/24/2018 2047   ACIDBASEDEF 3.0 (H) 05/24/2018 2047   O2SAT 99.0 05/24/2018 2047   CBG (last 3)  Recent Labs    05/28/18 2138 05/29/18 0648 05/29/18 0733  GLUCAP 118* 130* 128*    Assessment/Plan: S/P Procedure(s) (LRB): CORONARY  ARTERY BYPASS GRAFTING (CABG), ON PUMP, TIMES THREE, USING BILATERAL INTERNAL MAMMARY ARTERY AND HARVESTED LEFT RADIAL ARTERY (N/A) RADIAL ARTERY HARVEST (Left) TRANSESOPHAGEAL ECHOCARDIOGRAM (TEE) (N/A)  1. CV- NSR, on Lopressor 25 mg BID, continue Imdur for radial artery graft 2. Pulm- no acute issues, off oxygen, continue IS 3. Renal- creatinine has been stable, no edema on exam, no lasix or potassium indicated 4. Mild Deconditioning-will arrange H/H 5. Dispo- patient stable, headache is chronic, likely worsened with Imdur, will only have a month of treatment will continue, d/c EPW today, will observe and if has no arrhyhthmia will d/c home today   LOS: 9 days    Ellwood Handler 05/30/2018

## 2018-05-30 NOTE — Progress Notes (Addendum)
Progress Note  Patient Name: Daniel Herrera Date of Encounter: 05/30/2018  Primary Cardiologist: Quay Burow, MD   Subjective   Ready to go home.   Inpatient Medications    Scheduled Meds: . amiodarone  400 mg Oral BID  . aspirin EC  325 mg Oral Daily  . azelastine  1 spray Each Nare BID  . ferrous [id number] C-folic acid  1 capsule Oral BID PC  . fluticasone  1 spray Each Nare Daily  . isosorbide mononitrate  30 mg Oral Daily  . levothyroxine  75 mcg Oral QAC breakfast  . metoprolol tartrate  25 mg Oral BID  . moving right along book   Does not apply Once  . pantoprazole  40 mg Oral QAC breakfast  . potassium chloride  20 mEq Oral Daily  . pravastatin  80 mg Oral q1800  . sodium chloride flush  3 mL Intravenous Q12H   Continuous Infusions: . sodium chloride     PRN Meds: sodium chloride, acetaminophen, icatibant, ondansetron **OR** ondansetron (ZOFRAN) IV, sodium chloride flush, traMADol   Vital Signs    Vitals:   05/29/18 2330 05/30/18 0338 05/30/18 0343 05/30/18 0737  BP:  (!) 146/76  131/76  Pulse:  86  81  Resp: 16 16  18   Temp:  98.4 F (36.9 C)  98.2 F (36.8 C)  TempSrc:  Oral  Oral  SpO2:  93%  96%  Weight:   90.9 kg   Height:        Intake/Output Summary (Last 24 hours) at 05/30/2018 0935 Last data filed at 05/30/2018 0700 Gross per 24 hour  Intake 960 ml  Output 750 ml  Net 210 ml   Last 3 Weights 05/30/2018 05/29/2018 05/28/2018  Weight (lbs) 200 lb 6.4 oz 201 lb 4.5 oz 205 lb 1.6 oz  Weight (kg) 90.9 kg 91.3 kg 93.033 kg      Telemetry    SR - Personally Reviewed  ECG    N/a - Personally Reviewed  Physical Exam   GEN: No acute distress.   Neck: No JVD Cardiac: RRR, no murmurs, rubs, or gallops. Clean, dry sternotomy incision  Respiratory: Clear to auscultation bilaterally. GI: Soft, nontender, non-distended  MS: No edema; No deformity. Staples to left forearm Neuro:  Nonfocal  Psych: Normal affect   Labs      Chemistry Recent Labs  Lab 05/26/18 0400 05/27/18 0424 05/28/18 0500  NA 135 134* 135  K 4.1 3.9 3.5  CL 106 103 106  CO2 21* 24 24  GLUCOSE 117* 189* 114*  BUN 21 23 21   CREATININE 1.15 1.35* 1.19  CALCIUM 7.6* 7.8* 7.8*  GFRNONAA >60 50* 58*  GFRAA >60 57* >60  ANIONGAP 8 7 5      Hematology Recent Labs  Lab 05/25/18 1646 05/26/18 0400 05/27/18 0424  WBC 8.8 6.9 7.3  RBC 3.16* 2.71* 2.92*  HGB 9.6* 8.1* 8.7*  HCT 29.8* 25.9* 27.5*  MCV 94.3 95.6 94.2  MCH 30.4 29.9 29.8  MCHC 32.2 31.3 31.6  RDW 13.5 13.5 13.5  PLT 133* 115* 124*    Cardiac EnzymesNo results for input(s): TROPONINI in the last 168 hours. No results for input(s): TROPIPOC in the last 168 hours.   BNPNo results for input(s): BNP, PROBNP in the last 168 hours.   DDimer No results for input(s): DDIMER in the last 168 hours.   Radiology    No results found.  Cardiac Studies   Cath: 05/22/2018   Previously  placed Prox LAD to Mid LAD stent (unknown type) is widely patent.  1st Mrg lesion is 70% stenosed.  Prox RCA to Mid RCA lesion is 75% stenosed.  Prox Cx lesion is 100% stenosed.  Ost LAD to Prox LAD lesion is 75% stenosed.  Mid LAD lesion is 50% stenosed.  LV end diastolic pressure is normal.  There is no aortic valve stenosis.   Three vessel CAD.  Plan for surgery consultation for CABG.  TTE: 05/22/2018  IMPRESSIONS    1. The left ventricle has normal systolic function with an ejection fraction of 60-65%. The cavity size was normal. Left ventricular diastolic parameters were normal.  2. The right ventricle has normal systolic function. The cavity was normal. There is no increase in right ventricular wall thickness.  3. The mitral valve is normal in structure. Mild thickening of the mitral valve leaflet.  4. The tricuspid valve is normal in structure.  5. The aortic valve is tricuspid Mild thickening of the aortic valve Mild calcification of the aortic valve.  6. The  pulmonic valve was normal in structure.  7. Mild ascending aortic root dilatation 3.8 cm.  Patient Profile     78 y.o. male with PMH of CAD, HTN, HL, GERD and angio-edema who presented with chest pain. Found to have multivessel CAD and underwent CABG.   Assessment & Plan    1. NSTEMI: s/p 3v CABG. Doing well and planned for discharge today with H/H.  -- medications include ASA, statin, BB. Given he was a NSTEMI, could consider DAPT with ASA/plavix. Will defer to attending.   2. Post op PAF: Converted with IV amiodarone. Now on 400mg  BID. Switched to 200mg  BID at discharge for 7 days, then 200mg  daily.   3. CKD III: stable at 1.19  4. HL: on statin  CARDIOLOGY RECOMMENDATIONS:  Discharge is anticipated in the next 48 hours. Recommendations for medications and follow up:  Discharge Medications: Noted above Continue medications as they are currently listed in the [facility]. Exceptions to the above:  none  Follow Up: The patient's Primary Cardiologist is Quay Burow, MD   Follow up in the office in 2 week(s). Appt made  Signed,  Reino Bellis, NP  9:44 AM 05/30/2018  [facility]   For questions or updates, please contact [place] Please consult [email or web address] for contact info under        Signed, Reino Bellis, NP  05/30/2018, 9:35 AM    I have examined the patient and reviewed assessment and plan and discussed with patient.  Agree with above as stated.  Would normally recommend DAPT with Plavix and aspirin.  He had severe bruising on plavix in the past.  WOuld consider addidng back plavix after his Hbg has recovered.  He is willing to take it for 1 year.    Back in NSR.  Amio for now for PAF.  Likely temporary.  Larae Grooms

## 2018-05-30 NOTE — Care Management Note (Signed)
Case Management Note Daniel Gibbons RN, BSN Transitions of Care Unit 4E- RN Case Manager [phone]  Patient Details  Name: Daniel Herrera MRN: [id number] Date of Birth: 08/31/1940  Subjective/Objective:    Pt admitted with NSTEMI, s/p CABG x3                Action/Plan: PTA pt lived at home with wife, plan to return home, orders have been placed for HHRN/PT. CM in to speak with pt at bedside. List provided to pt for [facility] choice Per CMS guidelines from medicare.gov website with star ratings (copy placed in shadow chart), per pt he does not have a preference really, and has picked [facility] to start with and given CM the freedom that if [facility] can not accept to move forward with the next agency. Pt reports that he has all needed DME at home. Call made to [facility] with [facility] for HHRN/PT referral- referral has been accepted.   Expected Discharge Date:  05/30/18               Expected Discharge Plan:  [place]  In-House Referral:  NA  Discharge planning Services  CM Consult  Post Acute Care Choice:  Home Health Choice offered to:  Patient  DME Arranged:    DME Agency:     HH Arranged:  RN, PT [place] Agency:  [facility]  Status of Service:  Completed, signed off  If discussed at [place] of Stay Meetings, dates discussed:    Discharge Disposition: home/home health   Additional Comments:  Dawayne Patricia, RN 05/30/2018, 11:36 AM

## 2018-05-31 ENCOUNTER — Encounter: Payer: Self-pay | Admitting: Gastroenterology

## 2018-06-01 DIAGNOSIS — D841 Defects in the complement system: Secondary | ICD-10-CM | POA: Diagnosis not present

## 2018-06-01 DIAGNOSIS — I2510 Atherosclerotic heart disease of native coronary artery without angina pectoris: Secondary | ICD-10-CM | POA: Diagnosis not present

## 2018-06-01 DIAGNOSIS — Z951 Presence of aortocoronary bypass graft: Secondary | ICD-10-CM | POA: Diagnosis not present

## 2018-06-01 DIAGNOSIS — Z8546 Personal history of malignant neoplasm of prostate: Secondary | ICD-10-CM | POA: Diagnosis not present

## 2018-06-01 DIAGNOSIS — Z7982 Long term (current) use of aspirin: Secondary | ICD-10-CM | POA: Diagnosis not present

## 2018-06-01 DIAGNOSIS — E785 Hyperlipidemia, unspecified: Secondary | ICD-10-CM | POA: Diagnosis not present

## 2018-06-01 DIAGNOSIS — I252 Old myocardial infarction: Secondary | ICD-10-CM | POA: Diagnosis not present

## 2018-06-01 DIAGNOSIS — Z48812 Encounter for surgical aftercare following surgery on the circulatory system: Secondary | ICD-10-CM | POA: Diagnosis not present

## 2018-06-01 DIAGNOSIS — E1122 Type 2 diabetes mellitus with diabetic chronic kidney disease: Secondary | ICD-10-CM | POA: Diagnosis not present

## 2018-06-01 DIAGNOSIS — I1310 Hypertensive heart and chronic kidney disease without heart failure, with stage 1 through stage 4 chronic kidney disease, or unspecified chronic kidney disease: Secondary | ICD-10-CM | POA: Diagnosis not present

## 2018-06-01 DIAGNOSIS — N183 Chronic kidney disease, stage 3 (moderate): Secondary | ICD-10-CM | POA: Diagnosis not present

## 2018-06-01 DIAGNOSIS — I4891 Unspecified atrial fibrillation: Secondary | ICD-10-CM | POA: Diagnosis not present

## 2018-06-01 DIAGNOSIS — Z9181 History of falling: Secondary | ICD-10-CM | POA: Diagnosis not present

## 2018-06-01 DIAGNOSIS — I214 Non-ST elevation (NSTEMI) myocardial infarction: Secondary | ICD-10-CM | POA: Diagnosis not present

## 2018-06-01 DIAGNOSIS — Z9489 Other transplanted organ and tissue status: Secondary | ICD-10-CM | POA: Diagnosis not present

## 2018-06-03 DIAGNOSIS — I1310 Hypertensive heart and chronic kidney disease without heart failure, with stage 1 through stage 4 chronic kidney disease, or unspecified chronic kidney disease: Secondary | ICD-10-CM | POA: Diagnosis not present

## 2018-06-03 DIAGNOSIS — Z48812 Encounter for surgical aftercare following surgery on the circulatory system: Secondary | ICD-10-CM | POA: Diagnosis not present

## 2018-06-03 DIAGNOSIS — I214 Non-ST elevation (NSTEMI) myocardial infarction: Secondary | ICD-10-CM | POA: Diagnosis not present

## 2018-06-03 DIAGNOSIS — E1122 Type 2 diabetes mellitus with diabetic chronic kidney disease: Secondary | ICD-10-CM | POA: Diagnosis not present

## 2018-06-03 DIAGNOSIS — I2510 Atherosclerotic heart disease of native coronary artery without angina pectoris: Secondary | ICD-10-CM | POA: Diagnosis not present

## 2018-06-03 DIAGNOSIS — I4891 Unspecified atrial fibrillation: Secondary | ICD-10-CM | POA: Diagnosis not present

## 2018-06-04 DIAGNOSIS — Z48812 Encounter for surgical aftercare following surgery on the circulatory system: Secondary | ICD-10-CM | POA: Diagnosis not present

## 2018-06-04 DIAGNOSIS — E1122 Type 2 diabetes mellitus with diabetic chronic kidney disease: Secondary | ICD-10-CM | POA: Diagnosis not present

## 2018-06-04 DIAGNOSIS — I1310 Hypertensive heart and chronic kidney disease without heart failure, with stage 1 through stage 4 chronic kidney disease, or unspecified chronic kidney disease: Secondary | ICD-10-CM | POA: Diagnosis not present

## 2018-06-04 DIAGNOSIS — I214 Non-ST elevation (NSTEMI) myocardial infarction: Secondary | ICD-10-CM | POA: Diagnosis not present

## 2018-06-04 DIAGNOSIS — I4891 Unspecified atrial fibrillation: Secondary | ICD-10-CM | POA: Diagnosis not present

## 2018-06-04 DIAGNOSIS — I2510 Atherosclerotic heart disease of native coronary artery without angina pectoris: Secondary | ICD-10-CM | POA: Diagnosis not present

## 2018-06-05 ENCOUNTER — Encounter: Payer: Medicare Other | Admitting: Gastroenterology

## 2018-06-06 ENCOUNTER — Ambulatory Visit (INDEPENDENT_AMBULATORY_CARE_PROVIDER_SITE_OTHER): Payer: Self-pay

## 2018-06-06 DIAGNOSIS — Z951 Presence of aortocoronary bypass graft: Secondary | ICD-10-CM

## 2018-06-06 DIAGNOSIS — Z4802 Encounter for removal of sutures: Secondary | ICD-10-CM

## 2018-06-06 NOTE — Progress Notes (Signed)
Removed 4 sutures from chest tube sites, no signs of infection. Removed 15 staples from left radial artery harvest site, every other staple. Their are 16 staples remaining and patient will return next week 06/13/18 to have the other staples removed. No signs of infection and patient tolerated well.

## 2018-06-07 ENCOUNTER — Other Ambulatory Visit: Payer: Self-pay | Admitting: *Deleted

## 2018-06-07 DIAGNOSIS — I214 Non-ST elevation (NSTEMI) myocardial infarction: Secondary | ICD-10-CM | POA: Diagnosis not present

## 2018-06-07 DIAGNOSIS — Z79899 Other long term (current) drug therapy: Secondary | ICD-10-CM | POA: Diagnosis not present

## 2018-06-07 DIAGNOSIS — Z951 Presence of aortocoronary bypass graft: Secondary | ICD-10-CM | POA: Diagnosis not present

## 2018-06-07 DIAGNOSIS — I2510 Atherosclerotic heart disease of native coronary artery without angina pectoris: Secondary | ICD-10-CM | POA: Diagnosis not present

## 2018-06-08 DIAGNOSIS — I1310 Hypertensive heart and chronic kidney disease without heart failure, with stage 1 through stage 4 chronic kidney disease, or unspecified chronic kidney disease: Secondary | ICD-10-CM | POA: Diagnosis not present

## 2018-06-08 DIAGNOSIS — I214 Non-ST elevation (NSTEMI) myocardial infarction: Secondary | ICD-10-CM | POA: Diagnosis not present

## 2018-06-08 DIAGNOSIS — I4891 Unspecified atrial fibrillation: Secondary | ICD-10-CM | POA: Diagnosis not present

## 2018-06-08 DIAGNOSIS — E1122 Type 2 diabetes mellitus with diabetic chronic kidney disease: Secondary | ICD-10-CM | POA: Diagnosis not present

## 2018-06-08 DIAGNOSIS — I2510 Atherosclerotic heart disease of native coronary artery without angina pectoris: Secondary | ICD-10-CM | POA: Diagnosis not present

## 2018-06-08 DIAGNOSIS — Z48812 Encounter for surgical aftercare following surgery on the circulatory system: Secondary | ICD-10-CM | POA: Diagnosis not present

## 2018-06-11 DIAGNOSIS — E1122 Type 2 diabetes mellitus with diabetic chronic kidney disease: Secondary | ICD-10-CM | POA: Diagnosis not present

## 2018-06-11 DIAGNOSIS — I4891 Unspecified atrial fibrillation: Secondary | ICD-10-CM | POA: Diagnosis not present

## 2018-06-11 DIAGNOSIS — I214 Non-ST elevation (NSTEMI) myocardial infarction: Secondary | ICD-10-CM | POA: Diagnosis not present

## 2018-06-11 DIAGNOSIS — I1310 Hypertensive heart and chronic kidney disease without heart failure, with stage 1 through stage 4 chronic kidney disease, or unspecified chronic kidney disease: Secondary | ICD-10-CM | POA: Diagnosis not present

## 2018-06-11 DIAGNOSIS — Z48812 Encounter for surgical aftercare following surgery on the circulatory system: Secondary | ICD-10-CM | POA: Diagnosis not present

## 2018-06-11 DIAGNOSIS — I2510 Atherosclerotic heart disease of native coronary artery without angina pectoris: Secondary | ICD-10-CM | POA: Diagnosis not present

## 2018-06-13 ENCOUNTER — Encounter: Payer: Self-pay | Admitting: Cardiology

## 2018-06-13 ENCOUNTER — Other Ambulatory Visit: Payer: Self-pay

## 2018-06-13 ENCOUNTER — Ambulatory Visit (INDEPENDENT_AMBULATORY_CARE_PROVIDER_SITE_OTHER): Payer: Self-pay

## 2018-06-13 ENCOUNTER — Ambulatory Visit (INDEPENDENT_AMBULATORY_CARE_PROVIDER_SITE_OTHER): Payer: Medicare Other | Admitting: Cardiology

## 2018-06-13 VITALS — BP 126/78 | HR 79 | Ht 73.0 in | Wt 192.2 lb

## 2018-06-13 DIAGNOSIS — Z9861 Coronary angioplasty status: Secondary | ICD-10-CM | POA: Diagnosis not present

## 2018-06-13 DIAGNOSIS — I480 Paroxysmal atrial fibrillation: Secondary | ICD-10-CM

## 2018-06-13 DIAGNOSIS — I2510 Atherosclerotic heart disease of native coronary artery without angina pectoris: Secondary | ICD-10-CM

## 2018-06-13 DIAGNOSIS — Z951 Presence of aortocoronary bypass graft: Secondary | ICD-10-CM | POA: Diagnosis not present

## 2018-06-13 DIAGNOSIS — Z4802 Encounter for removal of sutures: Secondary | ICD-10-CM

## 2018-06-13 HISTORY — DX: Paroxysmal atrial fibrillation: I48.0

## 2018-06-13 NOTE — Progress Notes (Signed)
Patient arrived for nurse visit to remove 14 staples post- procedure CABG x3 with Dr. Cyndia Bent on 05/24/2018.  Staples removed with no signs/ symptoms of infection noted.  Patient tolerated procedure well.  Placed 3 steri-strips to patient's forearm to prevent wound dehiscence.  Patient made aware to keep strips in place until they curl up and fall off.   Patient/ family instructed to keep the incision sites clean and dry.  Patient/ family acknowledged instructions given.   Advised patient if he noticed any changes to the incision site color with temperature (for example increase in drainage, swelling, redness, red streaks, puss, foul odor or smell) to call the office for further evaluation.  Patient acknowledged receipt.   Patient and family are aware of his follow-up appointment 06/24/2018.

## 2018-06-13 NOTE — Assessment & Plan Note (Signed)
LIMA-LAD, RIMA-RCA, and Lt radial to OM.

## 2018-06-13 NOTE — Patient Instructions (Addendum)
Medication Instructions:  Your physician recommends that you continue on your current medications as directed. Please refer to the Current Medication list given to you today. If you need a refill on your cardiac medications before your next appointment, please call your pharmacy.   Lab work: NONE  If you have labs (blood work) drawn today and your tests are completely normal, you will receive your results only by: [name] MyChart Message (if you have MyChart) OR . A paper copy in the mail If you have any lab test that is abnormal or we need to change your treatment, we will call you to review the results.  Testing/Procedures: NONE  Follow-Up: At [facility], you and your health needs are our priority.  As part of our continuing mission to provide you with exceptional heart care, we have created designated Provider Care Teams.  These Care Teams include your primary Cardiologist (physician) and Advanced Practice Providers (APPs -  Physician Assistants and Nurse Practitioners) who all work together to provide you with the care you need, when you need it.  . Your physician recommends that you schedule a follow-up appointment in: EARLY NEXT MONTH WITH DR Daniel Herrera   Any Other Special Instructions Will Be Listed Below (If Applicable).

## 2018-06-13 NOTE — Assessment & Plan Note (Signed)
Post CABG- discharged on Amiodarone, he will probably not need this long term

## 2018-06-13 NOTE — Progress Notes (Signed)
06/13/2018 Daniel Herrera   Mar 13, 1941  [id number]  Primary Physician [name], NP Primary Cardiologist: Dr Gwenlyn Found  HPI:  Pleasant 78 y/o male, [place] in [place], followed by Dr Gwenlyn Found with a history of NSTEMI Nov 2016 treated with LADS DES x 2.  At that time he had residual moderate CFX disease and an EF of 45-50%.  He was seen in the office in Oct 2019.  He was doing well though he had been off ASA and Plavix secondary to lower GI bleeding.  We resumed his ASA in Oct.  He presented to the ED 05/20/2018 [place] (troponin 0.5).  Cath showed progression of CAD.  He underwent CABG x 3 with an LIMA-LAD, RIMA-RCA, and Lt radial to OM.  He tolerated this well.  He did have PAF post op and was discharged on Amiodarone.  He is in the office today accompanied by his sister for follow-up.  He is doing well, he is actually surprised at how well he is doing.  He says he has not taken a pain pill.  He had a little constipation early on but that resolved.  His heart rhythm is regular.  His appetite is good.   Current Outpatient Medications  Medication Sig Dispense Refill  . amiodarone (PACERONE) 200 MG tablet Take 1 tablet (200 mg total) by mouth 2 (two) times daily. For 7 days, then decrease to 200 mg daily 60 tablet 1  . aspirin EC 81 MG tablet Take 1 tablet (81 mg total) by mouth daily. 90 tablet 3  . azelastine (ASTELIN) 0.1 % nasal spray Place 1 spray into both nostrils 2 (two) times daily. 30 mL 5  . C1 Esterase Inhibitor, Human, (HAEGARDA) 3000 units SOLR Inject 5,500 Units into the skin as directed. Every 2-5 days    . EPINEPHrine 0.3 mg/0.3 mL IJ SOAJ injection Inject 0.3 mLs (0.3 mg total) into the muscle as needed (for allergic reaction). 2 Device 0  . fluticasone (FLONASE) 50 MCG/ACT nasal spray Place 1 spray into both nostrils daily. 16 g 4  . isosorbide mononitrate (IMDUR) 30 MG 24 hr tablet Take 1 tablet (30 mg total) by mouth daily. 30 tablet 0  . ketoconazole (NIZORAL) 2 %  shampoo Apply 1 application topically 2 (two) times daily.    [name] levothyroxine (SYNTHROID, LEVOTHROID) 75 MCG tablet Take 75 mcg by mouth daily before breakfast.    . metoprolol tartrate (LOPRESSOR) 25 MG tablet Take 1 tablet (25 mg total) by mouth 2 (two) times daily. 90 tablet 6  . montelukast (SINGULAIR) 10 MG tablet Take 1 tablet (10 mg total) by mouth at bedtime. 30 tablet 5  . nabumetone (RELAFEN) 750 MG tablet Take 1 tablet by mouth daily.    . naproxen (NAPROSYN) 375 MG tablet Take 1 tablet (375 mg total) by mouth 2 (two) times daily. 20 tablet 0  . nitroGLYCERIN (NITROSTAT) 0.4 MG SL tablet DISSOLVE 1 TABLET UNDER TONGUE EVERY 5 MINUTES FOR 3 DOSES AS NEEDED CHEST PAIN. IF NO RELIEF CALL 911 (Patient taking differently: Place 0.4 mg under the tongue every 5 (five) minutes as needed for chest pain. ) 75 tablet 2  . pantoprazole (PROTONIX) 40 MG tablet Take 1 tablet (40 mg total) by mouth 2 (two) times daily. 60 tablet 0  . Pitavastatin Calcium (LIVALO) 4 MG TABS Take 4 mg by mouth at bedtime.     Vladimir Faster Glycol-Propyl Glycol (SYSTANE OP) Place 1 drop into both eyes  2 (two) times daily.    . polyethylene glycol (MIRALAX) packet Take 17 g by mouth daily. 28 each 0  . traMADol (ULTRAM) 50 MG tablet Take 1 tablet (50 mg total) by mouth every 6 (six) hours as needed for moderate pain. 30 tablet 0  . triamcinolone cream (KENALOG) 0.5 % Apply 1 application topically daily.    [name] UNABLE TO FIND Takhzryo injected into the skin every 2 weeks     No current facility-administered medications for this visit.     Allergies  Allergen Reactions  . Lisinopril Swelling    Facial and lip swelling. And throat swelling.   . Benadryl [Diphenhydramine Hcl] Swelling    Facial swelling from high dose (tolerates Advil PM as needed)  . Darvocet [Propoxyphene N-Acetaminophen] Other (See Comments)    hallucination  . Darvon [Propoxyphene Hcl] Other (See Comments)    Hallucination   . Tylenol  [Acetaminophen] Swelling    Foot swelling    Past Medical History:  Diagnosis Date  . Angio-edema   . Anxiety   . Arthritis   . CAD (coronary artery disease)    a. 02/2015: DES to mid-LAD  . GERD (gastroesophageal reflux disease)   . Gout   . High cholesterol   . History of prolonged Q-T interval on ECG   . Hx of Clostridium difficile infection   . Hx of umbilical hernia repair   . Personal history of digestive disease    gastric ulcer  . Prostate cancer ([place])   . Prostate enlargement   . Skin cancer    basal cell carcinoma  . Thyroid disease   . Urticaria     Social History   Socioeconomic History  . Marital status: Married    Spouse name: Not on file  . Number of children: Not on file  . Years of education: Not on file  . Highest education level: Not on file  Occupational History  . Occupation: retired  Scientific laboratory technician  . Financial resource strain: Not on file  . Food insecurity:    Worry: Not on file    Inability: Not on file  . Transportation needs:    Medical: Not on file    Non-medical: Not on file  Tobacco Use  . Smoking status: Never Smoker  . Smokeless tobacco: Never Used  Substance and Sexual Activity  . Alcohol use: No  . Drug use: No  . Sexual activity: Not on file  Lifestyle  . Physical activity:    Days per week: Not on file    Minutes per session: Not on file  . Stress: Not on file  Relationships  . Social connections:    Talks on phone: Not on file    Gets together: Not on file    Attends religious service: Not on file    Active member of club or organization: Not on file    Attends meetings of clubs or organizations: Not on file    Relationship status: Not on file  . Intimate partner violence:    Fear of current or ex partner: Not on file    Emotionally abused: Not on file    Physically abused: Not on file    Forced sexual activity: Not on file  Other Topics Concern  . Not on file  Social History Narrative  . Not on file      Family History  Problem Relation Age of Onset  . Cancer Mother   . Cancer Father  prostate  . Cancer Sister      Review of Systems: General: negative for chills, fever, night sweats or weight changes.  Cardiovascular: negative for chest pain, dyspnea on exertion, edema, orthopnea, palpitations, paroxysmal nocturnal dyspnea or shortness of breath Dermatological: negative for rash Respiratory: negative for cough or wheezing Urologic: negative for hematuria Abdominal: negative for nausea, vomiting, diarrhea, bright red blood per rectum, melena, or hematemesis Neurologic: negative for visual changes, syncope, or dizziness All other systems reviewed and are otherwise negative except as noted above.    Blood pressure 126/78, pulse 79, height 6\' 1"  (1.854 m), weight 192 lb 3.2 oz (87.2 kg).  General appearance: alert, cooperative and no distress Lungs: clear to auscultation bilaterally Heart: regular rate and rhythm Extremities: no edema, Lt radial site healing Skin: pale, warm and dry Neurologic: Grossly normal  EKG NSR, NSST changes  ASSESSMENT AND PLAN:   S/P CABG x 3  LIMA-LAD, RIMA-RCA, and Lt radial to OM.  PAF (paroxysmal atrial fibrillation) (HCC) Post CABG- discharged on Amiodarone, he will probably not need this long term   PLAN  Same Rx- F/U Dr Gwenlyn Found in April  Maia Handa PA-C 06/13/2018 3:21 PM

## 2018-06-14 DIAGNOSIS — I1310 Hypertensive heart and chronic kidney disease without heart failure, with stage 1 through stage 4 chronic kidney disease, or unspecified chronic kidney disease: Secondary | ICD-10-CM | POA: Diagnosis not present

## 2018-06-14 DIAGNOSIS — E1122 Type 2 diabetes mellitus with diabetic chronic kidney disease: Secondary | ICD-10-CM | POA: Diagnosis not present

## 2018-06-14 DIAGNOSIS — I4891 Unspecified atrial fibrillation: Secondary | ICD-10-CM | POA: Diagnosis not present

## 2018-06-14 DIAGNOSIS — I2510 Atherosclerotic heart disease of native coronary artery without angina pectoris: Secondary | ICD-10-CM | POA: Diagnosis not present

## 2018-06-14 DIAGNOSIS — Z48812 Encounter for surgical aftercare following surgery on the circulatory system: Secondary | ICD-10-CM | POA: Diagnosis not present

## 2018-06-14 DIAGNOSIS — I214 Non-ST elevation (NSTEMI) myocardial infarction: Secondary | ICD-10-CM | POA: Diagnosis not present

## 2018-06-15 ENCOUNTER — Emergency Department (HOSPITAL_COMMUNITY): Payer: Medicare Other

## 2018-06-15 ENCOUNTER — Encounter (HOSPITAL_COMMUNITY): Payer: Self-pay | Admitting: Emergency Medicine

## 2018-06-15 ENCOUNTER — Observation Stay (HOSPITAL_COMMUNITY)
Admission: EM | Admit: 2018-06-15 | Discharge: 2018-06-16 | Disposition: A | Discharge status: Home / Self Care | Payer: Medicare Other | Attending: Internal Medicine | Admitting: Internal Medicine

## 2018-06-15 ENCOUNTER — Other Ambulatory Visit: Payer: Self-pay

## 2018-06-15 DIAGNOSIS — Z951 Presence of aortocoronary bypass graft: Secondary | ICD-10-CM

## 2018-06-15 DIAGNOSIS — I4891 Unspecified atrial fibrillation: Secondary | ICD-10-CM | POA: Diagnosis not present

## 2018-06-15 DIAGNOSIS — I480 Paroxysmal atrial fibrillation: Secondary | ICD-10-CM | POA: Diagnosis present

## 2018-06-15 DIAGNOSIS — R079 Chest pain, unspecified: Secondary | ICD-10-CM | POA: Diagnosis not present

## 2018-06-15 DIAGNOSIS — I25810 Atherosclerosis of coronary artery bypass graft(s) without angina pectoris: Secondary | ICD-10-CM | POA: Diagnosis not present

## 2018-06-15 DIAGNOSIS — C61 Malignant neoplasm of prostate: Secondary | ICD-10-CM | POA: Diagnosis not present

## 2018-06-15 DIAGNOSIS — N183 Chronic kidney disease, stage 3 unspecified: Secondary | ICD-10-CM | POA: Diagnosis present

## 2018-06-15 DIAGNOSIS — R0789 Other chest pain: Secondary | ICD-10-CM | POA: Diagnosis not present

## 2018-06-15 DIAGNOSIS — Z8546 Personal history of malignant neoplasm of prostate: Secondary | ICD-10-CM | POA: Insufficient documentation

## 2018-06-15 DIAGNOSIS — R42 Dizziness and giddiness: Secondary | ICD-10-CM | POA: Diagnosis not present

## 2018-06-15 LAB — I-STAT TROPONIN, ED: TROPONIN I, POC: 0.01 ng/mL (ref 0.00–0.08)

## 2018-06-15 LAB — BASIC METABOLIC PANEL
Anion gap: 7 (ref 5–15)
BUN: 23 mg/dL (ref 8–23)
CALCIUM: 8.7 mg/dL — AB (ref 8.9–10.3)
CO2: 24 mmol/L (ref 22–32)
Chloride: 105 mmol/L (ref 98–111)
Creatinine, Ser: 1.53 mg/dL — ABNORMAL HIGH (ref 0.61–1.24)
GFR calc Af Amer: 49 mL/min — ABNORMAL LOW (ref >60)
GFR calc non Af Amer: 43 mL/min — ABNORMAL LOW (ref >60)
Glucose, Bld: 183 mg/dL — ABNORMAL HIGH (ref 70–99)
Potassium: 4 mmol/L (ref 3.5–5.1)
SODIUM: 136 mmol/L (ref 135–145)

## 2018-06-15 LAB — CBC
HCT: 36.1 % — ABNORMAL LOW (ref 39.0–52.0)
Hemoglobin: 11.2 g/dL — ABNORMAL LOW (ref 13.0–17.0)
MCH: 29.1 pg (ref 26.0–34.0)
MCHC: 31 g/dL (ref 30.0–36.0)
MCV: 93.8 fL (ref 80.0–100.0)
Platelets: 273 10*3/uL (ref 150–400)
RBC: 3.85 MIL/uL — ABNORMAL LOW (ref 4.22–5.81)
RDW: 13.4 % (ref 11.5–15.5)
WBC: 6.3 10*3/uL (ref 4.0–10.5)
nRBC: 0 % (ref 0.0–0.2)

## 2018-06-15 MED ORDER — MELATONIN 3 MG PO TABS
3.0000 mg | ORAL_TABLET | Freq: Every day | ORAL | Status: DC
  Administered 2018-06-15: 3 mg via ORAL
  Filled 2018-06-15: qty 1

## 2018-06-15 MED ORDER — ONDANSETRON HCL 4 MG/2ML IJ SOLN: 4.0000 mg | Freq: Four times a day (QID) | INTRAMUSCULAR | Status: DC | PRN

## 2018-06-15 MED ORDER — PANTOPRAZOLE SODIUM 40 MG PO TBEC
40.0000 mg | DELAYED_RELEASE_TABLET | Freq: Two times a day (BID) | ORAL | Status: AC
  Administered 2018-06-15 – 2018-06-16 (×2): 40 mg via ORAL
  Filled 2018-06-15 (×2): qty 1

## 2018-06-15 MED ORDER — ACETAMINOPHEN 325 MG PO TABS: 650.0000 mg | ORAL_TABLET | ORAL | Status: DC | PRN

## 2018-06-15 MED ORDER — SODIUM CHLORIDE 0.9% FLUSH: 3.0000 mL | Freq: Once | INTRAVENOUS | Status: DC

## 2018-06-15 MED ORDER — ENOXAPARIN SODIUM 100 MG/ML ~~LOC~~ SOLN
1.0000 mg/kg | Freq: Two times a day (BID) | SUBCUTANEOUS | Status: DC
  Administered 2018-06-15 – 2018-06-16 (×2): 90 mg via SUBCUTANEOUS
  Filled 2018-06-15 (×2): qty 0.9

## 2018-06-15 NOTE — Consult Note (Signed)
Cardiology Consultation:   Patient ID: AAKASH HOLLOMON MRN: [id number]; DOB: 1940/06/17  Admit date: 06/15/2018 Date of Consult: 06/15/2018  Primary Care Provider: [name], NP Primary Cardiologist: Quay Burow, MD    Patient Profile:   RANDI COLLEGE is a 78 y.o. male with a hx of CAD with multiple prior PCI and recent CABG, HFrEF, HLD, who is being seen today for the evaluation of chest pain at the request of Dr. Truman Hayward.  History of Present Illness:   HARSHAL SIRMON is a 78 y.o. male with a hx of CAD with multiple prior PCI and recent CABG, HFrEF, HLD, who is being seen today for the evaluation of chest pain.   The patient has CAD and was hospitalized in February 2020 with NSTEMI. LHC showed progressive CAD, and he underwent CABG on 05/20/18 with LIMA-LAD, RIMA-RCA, and Lt radial to OM.  He tolerated this well.  He did have PAF post op and was discharged on Amiodarone. He was seen for follow up in clinic on 06/13/18 and was doing well.   He presented to the ED today with chest pain. In the ED, HR 70s, SBP 130s. ECG showed NSR with first degree AV block, incomplete RBBB, LAFB, borderline inferior Q waves (minimal R). Troponin negative x1. Cr 1.53 (last measured to be 1.19). ED physician performed bedside US that was described to show small pericardial effusion. He is being admitted to the hospitalist service, and cardiology was consulted for further recommendations.  On my evaluation, the patient is resting comfortably in bed. He states that yesterday he had a short episode of sharp chest pain in his upper chest that resolved spontaneously. Today he had another episode of sharp chest pain in his lower chest that never completely resolved. He states that this pain is different from his prior angina and comes without trigger. He tried NTG x2 without improvement. He denies all associated symptoms.   Past Medical History:  Diagnosis Date  . Angio-edema   . Anxiety   . Arthritis    . CAD (coronary artery disease)    a. 02/2015: DES to mid-LAD  . GERD (gastroesophageal reflux disease)   . Gout   . High cholesterol   . History of prolonged Q-T interval on ECG   . Hx of Clostridium difficile infection   . Hx of umbilical hernia repair   . Personal history of digestive disease    gastric ulcer  . Prostate cancer ([place])   . Prostate enlargement   . Skin cancer    basal cell carcinoma  . Thyroid disease   . Urticaria     Past Surgical History:  Procedure Laterality Date  . APPENDECTOMY    . BACK SURGERY    . CARDIAC CATHETERIZATION N/A 02/21/2015   Procedure: Left Heart Cath;  Surgeon: Lorretta Harp, MD;  Location: [facility];  Service: Cardiovascular;  Laterality: N/A;  . CATARACT EXTRACTION, BILATERAL    . CORONARY ARTERY BYPASS GRAFT N/A 05/24/2018   Procedure: CORONARY ARTERY BYPASS GRAFTING (CABG), ON PUMP, TIMES THREE, USING BILATERAL INTERNAL MAMMARY ARTERY AND HARVESTED LEFT RADIAL ARTERY;  Surgeon: Gaye Pollack, MD;  Location: [facility];  Service: Open Heart Surgery;  Laterality: N/A;  . LEFT HEART CATH AND CORONARY ANGIOGRAPHY N/A 05/22/2018   Procedure: LEFT HEART CATH AND CORONARY ANGIOGRAPHY;  Surgeon: Jettie Booze, MD;  Location: [facility];  Service: Cardiovascular;  Laterality: N/A;  . NASAL SINUS SURGERY    .  PROSTATE BIOPSY    . RADIAL ARTERY HARVEST Left 05/24/2018   Procedure: RADIAL ARTERY HARVEST;  Surgeon: Gaye Pollack, MD;  Location: [facility];  Service: Open Heart Surgery;  Laterality: Left;  . right shoulder rotator cuff repair    . TEE WITHOUT CARDIOVERSION N/A 05/24/2018   Procedure: TRANSESOPHAGEAL ECHOCARDIOGRAM (TEE);  Surgeon: Gaye Pollack, MD;  Location: [facility];  Service: Open Heart Surgery;  Laterality: N/A;     Home Medications:  Prior to Admission medications   Medication Sig Start Date End Date Taking? Authorizing Provider  amiodarone (PACERONE) 200 MG tablet Take 1 tablet (200 mg total) by mouth 2  (two) times daily. For 7 days, then decrease to 200 mg daily 05/30/18   Barrett, Lodema Hong, PA-C  aspirin EC 81 MG tablet Take 1 tablet (81 mg total) by mouth daily. 01/02/18   Erlene Quan, PA-C  azelastine (ASTELIN) 0.1 % nasal spray Place 1 spray into both nostrils 2 (two) times daily. 12/12/17   Kozlow, Donnamarie Poag, MD  C1 Esterase Inhibitor, Human, (HAEGARDA) 3000 units SOLR Inject 5,500 Units into the skin as directed. Every 2-5 days    [provider]  EPINEPHrine 0.3 mg/0.3 mL IJ SOAJ injection Inject 0.3 mLs (0.3 mg total) into the muscle as needed (for allergic reaction). 05/20/18   Kozlow, Donnamarie Poag, MD  fluticasone (FLONASE) 50 MCG/ACT nasal spray Place 1 spray into both nostrils daily. 12/12/17   Kozlow, Donnamarie Poag, MD  isosorbide mononitrate (IMDUR) 30 MG 24 hr tablet Take 1 tablet (30 mg total) by mouth daily. 05/30/18   Barrett, Erin R, PA-C  ketoconazole (NIZORAL) 2 % shampoo Apply 1 application topically 2 (two) times daily. 05/14/18   [provider]  levothyroxine (SYNTHROID, LEVOTHROID) 75 MCG tablet Take 75 mcg by mouth daily before breakfast.    [provider]  metoprolol tartrate (LOPRESSOR) 25 MG tablet Take 1 tablet (25 mg total) by mouth 2 (two) times daily. 05/25/15   Janece Canterbury, MD  montelukast (SINGULAIR) 10 MG tablet Take 1 tablet (10 mg total) by mouth at bedtime. 09/12/17   Kozlow, Donnamarie Poag, MD  nabumetone (RELAFEN) 750 MG tablet Take 1 tablet by mouth daily. 05/08/18   [provider]  naproxen (NAPROSYN) 375 MG tablet Take 1 tablet (375 mg total) by mouth 2 (two) times daily. 02/09/18   Law, Bea Graff, PA-C  nitroGLYCERIN (NITROSTAT) 0.4 MG SL tablet DISSOLVE 1 TABLET UNDER TONGUE EVERY 5 MINUTES FOR 3 DOSES AS NEEDED CHEST PAIN. IF NO RELIEF CALL 911 Patient taking differently: Place 0.4 mg under the tongue every 5 (five) minutes as needed for chest pain.  03/03/15   Lyda Jester M, PA-C  pantoprazole (PROTONIX) 40 MG tablet Take 1 tablet (40  mg total) by mouth 2 (two) times daily. 12/23/17   Thurnell Lose, MD  Pitavastatin Calcium (LIVALO) 4 MG TABS Take 4 mg by mouth at bedtime.     [provider]  Polyethyl Glycol-Propyl Glycol (SYSTANE OP) Place 1 drop into both eyes 2 (two) times daily.    [provider]  polyethylene glycol (MIRALAX) packet Take 17 g by mouth daily. 12/23/17   Thurnell Lose, MD  traMADol (ULTRAM) 50 MG tablet Take 1 tablet (50 mg total) by mouth every 6 (six) hours as needed for moderate pain. 05/30/18   Barrett, Erin R, PA-C  triamcinolone cream (KENALOG) 0.5 % Apply 1 application topically daily. 04/06/18   [provider]  Karen Kays  TO FIND Takhzryo injected into the skin every 2 weeks    [provider]    Inpatient Medications: Scheduled Meds: . sodium chloride flush  3 mL Intravenous Once   Continuous Infusions:  PRN Meds:   Allergies:    Allergies  Allergen Reactions  . Lisinopril Swelling    Facial and lip swelling. And throat swelling.   . Benadryl [Diphenhydramine Hcl] Swelling    Facial swelling from high dose (tolerates Advil PM as needed)  . Darvocet [Propoxyphene N-Acetaminophen] Other (See Comments)    hallucination  . Darvon [Propoxyphene Hcl] Other (See Comments)    Hallucination   . Tylenol [Acetaminophen] Swelling    Foot swelling    Social History:   Social History   Socioeconomic History  . Marital status: Married    Spouse name: Not on file  . Number of children: Not on file  . Years of education: Not on file  . Highest education level: Not on file  Occupational History  . Occupation: retired  Scientific laboratory technician  . Financial resource strain: Not on file  . Food insecurity:    Worry: Not on file    Inability: Not on file  . Transportation needs:    Medical: Not on file    Non-medical: Not on file  Tobacco Use  . Smoking status: Never Smoker  . Smokeless tobacco: Never Used  Substance and Sexual Activity  . Alcohol use: No   . Drug use: No  . Sexual activity: Not on file  Lifestyle  . Physical activity:    Days per week: Not on file    Minutes per session: Not on file  . Stress: Not on file  Relationships  . Social connections:    Talks on phone: Not on file    Gets together: Not on file    Attends religious service: Not on file    Active member of club or organization: Not on file    Attends meetings of clubs or organizations: Not on file    Relationship status: Not on file  . Intimate partner violence:    Fear of current or ex partner: Not on file    Emotionally abused: Not on file    Physically abused: Not on file    Forced sexual activity: Not on file  Other Topics Concern  . Not on file  Social History Narrative  . Not on file    Family History:     Family History  Problem Relation Age of Onset  . Cancer Mother   . Cancer Father        prostate  . Cancer Sister    Melodie Bouillon of any family history of heart disease.   ROS:  Please see the history of present illness.  All other ROS reviewed and negative.     Physical Exam/Data:   Vitals:   06/15/18 1707 06/15/18 1730 06/15/18 1745 06/15/18 1745  BP: 129/88 130/84 130/72   Pulse: 78 78 76   Resp: 14 14 19    Temp: 98.5 F (36.9 C)     TempSrc: Oral     SpO2: 99% 98% 98%   Weight:    87.5 kg  Height:    6\' 1"  (1.854 m)   No intake or output data in the 24 hours ending 06/15/18 1854 Last 3 Weights 06/15/2018 06/13/2018 05/30/2018  Weight (lbs) 193 lb 192 lb 3.2 oz 200 lb 6.4 oz  Weight (kg) 87.544 kg 87.181 kg 90.9 kg  Body mass index is 25.46 kg/m.  General:  Well nourished, well developed, in no acute distress  HEENT: wearing mask Neck: no JVD Cardiac:  normal S1, S2; RRR; no murmur   Lungs:  clear to auscultation bilaterally, no wheezing, rhonchi or rales  Abd: soft, nontender  Ext: no edema Musculoskeletal:  No deformities, BUE and BLE strength normal and equal Skin: warm and dry. Well healing sternotomy scar.  Neuro:   No focal abnormalities noted Psych:  Normal affect   EKG:  The EKG was personally reviewed and demonstrates:  NSR with first degree AV block, incomplete RBBB, LAFB, borderline inferior Q waves (minimal R).  Telemetry:  Telemetry was personally reviewed and demonstrates:  NSR with no events-  Relevant CV Studies:  TTE 05/2018: 1. The left ventricle has normal systolic function with an ejection fraction of 60-65%. The cavity size was normal. Left ventricular diastolic parameters were normal.  2. The right ventricle has normal systolic function. The cavity was normal. There is no increase in right ventricular wall thickness.  3. The mitral valve is normal in structure. Mild thickening of the mitral valve leaflet.  4. The tricuspid valve is normal in structure.  5. The aortic valve is tricuspid Mild thickening of the aortic valve Mild calcification of the aortic valve.  6. The pulmonic valve was normal in structure.  7. Mild ascending aortic root dilatation 3.8 cm.  [place] 05/2018:  Previously placed Prox LAD to Mid LAD stent (unknown type) is widely patent.  1st Mrg lesion is 70% stenosed.  Prox RCA to Mid RCA lesion is 75% stenosed.  Prox Cx lesion is 100% stenosed.  Ost LAD to Prox LAD lesion is 75% stenosed.  Mid LAD lesion is 50% stenosed.  LV end diastolic pressure is normal.  There is no aortic valve stenosis.  Laboratory Data:  Chemistry Recent Labs  Lab 06/15/18 1717  NA 136  K 4.0  CL 105  CO2 24  GLUCOSE 183*  BUN 23  CREATININE 1.53*  CALCIUM 8.7*  GFRNONAA 43*  GFRAA 49*  ANIONGAP 7    No results for input(s): PROT, ALBUMIN, AST, ALT, ALKPHOS, BILITOT in the last 168 hours. Hematology Recent Labs  Lab 06/15/18 1717  WBC 6.3  RBC 3.85*  HGB 11.2*  HCT 36.1*  MCV 93.8  MCH 29.1  MCHC 31.0  RDW 13.4  PLT 273   Cardiac EnzymesNo results for input(s): TROPONINI in the last 168 hours.  Recent Labs  Lab 06/15/18 1722  TROPIPOC 0.01    BNPNo  results for input(s): BNP, PROBNP in the last 168 hours.  DDimer No results for input(s): DDIMER in the last 168 hours.  Radiology/Studies:  Dg Chest 2 View  Result Date: 06/15/2018 CLINICAL DATA:  Left chest pain for 1 day. Status post CABG on 05/24/2018. EXAM: CHEST - 2 VIEW COMPARISON:  05/26/2018. FINDINGS: Normal sized heart with an interval decrease in size. Tortuous aorta. Post CABG changes. The right jugular catheter sheath has been removed. The lungs are hyperexpanded with stable mild prominence of the interstitial markings and biapical pleural and parenchymal scarring. No residual atelectasis or pleural fluid is seen. Diffuse osteopenia. Right shoulder fixation anchor. IMPRESSION: 1. No acute abnormality. 2. Stable changes of COPD. Electronically Signed   By: Claudie Revering M.D.   On: 06/15/2018 18:27    Assessment and Plan:   Chest pain CAD with recent CABG The patient has CAD with recent uneventful CABG. He presents with new chest pain that  is different than his prior chest pain and atypical for angina by description. ECG shows no acute abnormalities, and troponin is negative x1. His pain is therefore unlikely to be due to ACS. Query possible pericarditis (especially given ED report of small effusion), although he has no significant rub or classic ECG findings. His pain may be due to other MSK etiology in the setting of his sternotomy. At this time, rule out of ACS is reasonable with evaluation for other causes of symptoms. -Continue to trend troponin -Can consider treatment for pericarditis, although will need to use caution with NSAIDS, colchicine given AoCKD -Continue home ASA -Continue pitavastatin; he states he has not tolerated many other statins in the past -Continue isosorbide -Continue metoprolol   Pericardial effusion ED physician performed bedside echo and described very small pericardial effusion. This is likely due to postoperative changes. He is HD stable, which is  reassuring against tamponade. -Formal echocardiogram ordered  AF Currently in NSR -Continue amiodarone -Not currently on anticoagulation. Should discuss this in outpatient setting. - HFrEF with normalization of EF -Continue above management  HLD -Continue pitavastatin       For questions or updates, please contact [facility] Please consult [email or web address] for contact info under     Signed, Nila Nephew, MD  06/15/2018 6:54 PM

## 2018-06-15 NOTE — ED Triage Notes (Signed)
Pt c/o left sided chest pain x 1 day. Denies shortness of breath. Hx CABG 2/21.

## 2018-06-15 NOTE — H&P (Signed)
History and Physical   Daniel Herrera:[id number] DOB: [birth year] DOA: 06/15/2018  Referring MD/NP/PA: Dr. Shirlyn Goltz  PCP: [name], NP   Outpatient Specialists: Dr Daneen Schick, Cardiology   Patient coming from: Home  Chief Complaint: Chest pain  HPI: Daniel Herrera is a 78 y.o. male with medical history significant of Coronary artery disease status post coronary artery bypass graft x3 recently, history of GERD, paroxysmal atrial fibrillation, osteoarthritis, anxiety disorder, hyperlipidemia, prostate cancer and hypothyroidism who had coronary artery bypass grafting about a month ago.  Patient has done very well postoperatively and has been discharged home.  He actually went for his postop visit 2 days ago.  Today he started experiencing left-sided chest pain and pressure.  Associated with some nausea.  Pain is in the left chest.  Slight radiation.  Denied any diaphoresis.  It was described as abdominal pain.  Patient was seen in the ER and initial enzymes and EKG appeared to be stable. .  ED Course: Temperature 98.5 blood pressure 152/80 pulse 84, respiratory rate of 90 and oxygen sat 98% room air.  CBC essentially within normal except for hemoglobin 11.3.  Electrolytes showed creatinine of 1.53 and calcium of 8.7.  Chest x-ray showed no acute findings.  Bedside ultrasound showed minimal pericardial effusion.  Cardiology consulted and patient is being admitted for evaluation.  Review of Systems: As per HPI otherwise 10 point review of systems negative.    Past Medical History:  Diagnosis Date  . Angio-edema   . Anxiety   . Arthritis   . CAD (coronary artery disease)    a. 02/2015: DES to mid-LAD  . GERD (gastroesophageal reflux disease)   . Gout   . High cholesterol   . History of prolonged Q-T interval on ECG   . Hx of Clostridium difficile infection   . Hx of umbilical hernia repair   . Personal history of digestive disease    gastric ulcer  . Prostate cancer ([place])    . Prostate enlargement   . Skin cancer    basal cell carcinoma  . Thyroid disease   . Urticaria     Past Surgical History:  Procedure Laterality Date  . APPENDECTOMY    . BACK SURGERY    . CARDIAC CATHETERIZATION N/A 02/21/2015   Procedure: Left Heart Cath;  Surgeon: Lorretta Harp, MD;  Location: [facility];  Service: Cardiovascular;  Laterality: N/A;  . CATARACT EXTRACTION, BILATERAL    . CORONARY ARTERY BYPASS GRAFT N/A 05/24/2018   Procedure: CORONARY ARTERY BYPASS GRAFTING (CABG), ON PUMP, TIMES THREE, USING BILATERAL INTERNAL MAMMARY ARTERY AND HARVESTED LEFT RADIAL ARTERY;  Surgeon: Gaye Pollack, MD;  Location: [facility];  Service: Open Heart Surgery;  Laterality: N/A;  . LEFT HEART CATH AND CORONARY ANGIOGRAPHY N/A 05/22/2018   Procedure: LEFT HEART CATH AND CORONARY ANGIOGRAPHY;  Surgeon: Jettie Booze, MD;  Location: [facility] [facility];  Service: Cardiovascular;  Laterality: N/A;  . NASAL SINUS SURGERY    . PROSTATE BIOPSY    . RADIAL ARTERY HARVEST Left 05/24/2018   Procedure: RADIAL ARTERY HARVEST;  Surgeon: Gaye Pollack, MD;  Location: [facility];  Service: Open Heart Surgery;  Laterality: Left;  . right shoulder rotator cuff repair    . TEE WITHOUT CARDIOVERSION N/A 05/24/2018   Procedure: TRANSESOPHAGEAL ECHOCARDIOGRAM (TEE);  Surgeon: Gaye Pollack, MD;  Location: [place];  Service: Open Heart Surgery;  Laterality: N/A;     reports that  he has never smoked. He has never used smokeless tobacco. He reports that he does not drink alcohol or use drugs.  Allergies  Allergen Reactions  . Lisinopril Swelling    Facial and lip swelling. And throat swelling.   . Benadryl [Diphenhydramine Hcl] Swelling    Facial swelling from high dose (tolerates Advil PM as needed)  . Darvocet [Propoxyphene N-Acetaminophen] Other (See Comments)    hallucination  . Darvon [Propoxyphene Hcl] Other (See Comments)    Hallucination   . Tylenol [Acetaminophen] Swelling    Foot  swelling    Family History  Problem Relation Age of Onset  . Cancer Mother   . Cancer Father        prostate  . Cancer Sister      Prior to Admission medications   Medication Sig Start Date End Date Taking? Authorizing Provider  amiodarone (PACERONE) 200 MG tablet Take 1 tablet (200 mg total) by mouth 2 (two) times daily. For 7 days, then decrease to 200 mg daily Patient taking differently: Take 200 mg by mouth daily.  05/30/18  Yes Barrett, Lodema Hong, PA-C  aspirin EC 81 MG tablet Take 1 tablet (81 mg total) by mouth daily. 01/02/18  Yes Kilroy, Luke K, PA-C  azelastine (ASTELIN) 0.1 % nasal spray Place 1 spray into both nostrils 2 (two) times daily. 12/12/17  Yes Kozlow, Donnamarie Poag, MD  C1 Esterase Inhibitor, Human, (HAEGARDA) 3000 units SOLR Inject 5,500 Units into the skin as directed. Every 2-5 days   Yes [provider]  EPINEPHrine 0.3 mg/0.3 mL IJ SOAJ injection Inject 0.3 mLs (0.3 mg total) into the muscle as needed (for allergic reaction). 05/20/18  Yes Kozlow, Donnamarie Poag, MD  fluticasone (FLONASE) 50 MCG/ACT nasal spray Place 1 spray into both nostrils daily. 12/12/17  Yes Kozlow, Donnamarie Poag, MD  isosorbide mononitrate (IMDUR) 30 MG 24 hr tablet Take 1 tablet (30 mg total) by mouth daily. 05/30/18  Yes Barrett, Erin R, PA-C  ketoconazole (NIZORAL) 2 % shampoo Apply 1 application topically 2 (two) times daily. 05/14/18  Yes [provider]  levothyroxine (SYNTHROID, LEVOTHROID) 75 MCG tablet Take 75 mcg by mouth daily before breakfast.   Yes [provider]  metoprolol tartrate (LOPRESSOR) 25 MG tablet Take 1 tablet (25 mg total) by mouth 2 (two) times daily. 05/25/15  Yes Short, Noah Delaine, MD  montelukast (SINGULAIR) 10 MG tablet Take 1 tablet (10 mg total) by mouth at bedtime. 09/12/17  Yes Kozlow, Donnamarie Poag, MD  nabumetone (RELAFEN) 750 MG tablet Take 1 tablet by mouth daily. 05/08/18  Yes [provider]  naproxen (NAPROSYN) 375 MG tablet Take 1 tablet (375 mg total)  by mouth 2 (two) times daily. 02/09/18  Yes Law, [place] M, PA-C  nitroGLYCERIN (NITROSTAT) 0.4 MG SL tablet DISSOLVE 1 TABLET UNDER TONGUE EVERY 5 MINUTES FOR 3 DOSES AS NEEDED CHEST PAIN. IF NO RELIEF CALL 911 Patient taking differently: Place 0.4 mg under the tongue every 5 (five) minutes as needed for chest pain.  03/03/15  Yes Simmons, Brittainy M, PA-C  OVER THE COUNTER MEDICATION Take 1-2 capsules by mouth See admin instructions. Health Plus - Super Colon Cleanse 1 capsule in the morning and 2 capsules at night as needed for bowel movement   Yes [provider]  pantoprazole (PROTONIX) 40 MG tablet Take 1 tablet (40 mg total) by mouth 2 (two) times daily. 12/23/17  Yes Thurnell Lose, MD  Pitavastatin Calcium (LIVALO) 4 MG TABS Take 4 mg  by mouth at bedtime.    Yes [provider]  Polyethyl Glycol-Propyl Glycol (SYSTANE OP) Place 1 drop into both eyes 2 (two) times daily.   Yes [provider]  traMADol (ULTRAM) 50 MG tablet Take 1 tablet (50 mg total) by mouth every 6 (six) hours as needed for moderate pain. 05/30/18  Yes Barrett, Erin R, PA-C  triamcinolone cream (KENALOG) 0.5 % Apply 1 application topically daily. 04/06/18  Yes [provider]  UNABLE TO [place] injected into the skin every 2 weeks   Yes [provider]  polyethylene glycol (MIRALAX) packet Take 17 g by mouth daily. Patient not taking: Reported on 06/15/2018 12/23/17   Thurnell Lose, MD    Physical Exam: Vitals:   06/15/18 1745 06/15/18 1745 06/15/18 1900 06/15/18 2104  BP: 130/72  125/78 (!) 152/80  Pulse: 76  81 84  Resp: 19  18 18   Temp:    98.1 F (36.7 C)  TempSrc:    Oral  SpO2: 98%  99% 99%  Weight:  87.5 kg    Height:  6\' 1"  (1.854 m)        Constitutional: NAD, calm, comfortable Vitals:   06/15/18 1745 06/15/18 1745 06/15/18 1900 06/15/18 2104  BP: 130/72  125/78 (!) 152/80  Pulse: 76  81 84  Resp: 19  18 18   Temp:    98.1 F (36.7 C)   TempSrc:    Oral  SpO2: 98%  99% 99%  Weight:  87.5 kg    Height:  6\' 1"  (1.854 m)     Eyes: PERRL, lids and conjunctivae normal ENMT: Mucous membranes are moist. Posterior pharynx clear of any exudate or lesions.Normal dentition.  Neck: normal, supple, no masses, no thyromegaly Respiratory: CABG scar visible, reproducible chest wall pain.   Clear to auscultation bilaterally, no wheezing, no crackles. Normal respiratory effort. No accessory muscle use.  Cardiovascular: Regular rate and rhythm, no murmurs / rubs / gallops. No extremity edema. 2+ pedal pulses. No carotid bruits.  Abdomen: no tenderness, no masses palpated. No hepatosplenomegaly. Bowel sounds positive.  Musculoskeletal: no clubbing / cyanosis. No joint deformity upper and lower extremities. Good ROM, no contractures. Normal muscle tone.  Skin: no rashes, lesions, ulcers. No induration Neurologic: CN 2-12 grossly intact. Sensation intact, DTR normal. Strength 5/5 in all 4.  Psychiatric: Normal judgment and insight. Alert and oriented x 3. Normal mood.     Labs on Admission: I have personally reviewed following labs and imaging studies  CBC: Recent Labs  Lab 06/15/18 1717  WBC 6.3  HGB 11.2*  HCT 36.1*  MCV 93.8  PLT 790   Basic Metabolic Panel: Recent Labs  Lab 06/15/18 1717  NA 136  K 4.0  CL 105  CO2 24  GLUCOSE 183*  BUN 23  CREATININE 1.53*  CALCIUM 8.7*   GFR: Estimated Creatinine Clearance: 44.2 mL/min (A) (by C-G formula based on SCr of 1.53 mg/dL (H)). Liver Function Tests: No results for input(s): AST, ALT, ALKPHOS, BILITOT, PROT, ALBUMIN in the last 168 hours. No results for input(s): LIPASE, AMYLASE in the last 168 hours. No results for input(s): AMMONIA in the last 168 hours. Coagulation Profile: No results for input(s): INR, PROTIME in the last 168 hours. Cardiac Enzymes: No results for input(s): CKTOTAL, CKMB, CKMBINDEX, TROPONINI in the last 168 hours. BNP (last 3 results) No results  for input(s): PROBNP in the last 8760 hours. HbA1C: No results for input(s): HGBA1C in the last 72 hours.  CBG: No results for input(s): GLUCAP in the last 168 hours. Lipid Profile: No results for input(s): CHOL, HDL, LDLCALC, TRIG, CHOLHDL, LDLDIRECT in the last 72 hours. Thyroid Function Tests: No results for input(s): TSH, T4TOTAL, FREET4, T3FREE, THYROIDAB in the last 72 hours. Anemia Panel: No results for input(s): VITAMINB12, FOLATE, FERRITIN, TIBC, IRON, RETICCTPCT in the last 72 hours. Urine analysis:    Component Value Date/Time   COLORURINE STRAW (A) 05/23/2018 1511   APPEARANCEUR CLEAR 05/23/2018 1511   LABSPEC 1.008 05/23/2018 1511   PHURINE 7.0 05/23/2018 1511   GLUCOSEU NEGATIVE 05/23/2018 1511   HGBUR NEGATIVE 05/23/2018 1511   BILIRUBINUR NEGATIVE 05/23/2018 1511   KETONESUR NEGATIVE 05/23/2018 1511   PROTEINUR NEGATIVE 05/23/2018 1511   UROBILINOGEN 0.2 10/27/2012 2246   NITRITE NEGATIVE 05/23/2018 1511   LEUKOCYTESUR NEGATIVE 05/23/2018 1511   Sepsis Labs: @LABRCNTIP (procalcitonin:4,lacticidven:4) )No results found for this or any previous visit (from the past 240 hour(s)).   Radiological Exams on Admission: Dg Chest 2 View  Result Date: 06/15/2018 CLINICAL DATA:  Left chest pain for 1 day. Status post CABG on 05/24/2018. EXAM: CHEST - 2 VIEW COMPARISON:  05/26/2018. FINDINGS: Normal sized heart with an interval decrease in size. Tortuous aorta. Post CABG changes. The right jugular catheter sheath has been removed. The lungs are hyperexpanded with stable mild prominence of the interstitial markings and biapical pleural and parenchymal scarring. No residual atelectasis or pleural fluid is seen. Diffuse osteopenia. Right shoulder fixation anchor. IMPRESSION: 1. No acute abnormality. 2. Stable changes of COPD. Electronically Signed   By: Claudie Revering M.D.   On: 06/15/2018 18:27    EKG: Independently reviewed.  It showed normal sinus rhythm with a rate of 77,  prolonged PR interval.  Bifascicular block and ST elevation in the inferior leads which is not new.  Assessment/Plan Principal Problem:   Chest pain Active Problems:   Malignant neoplasm of prostate (HCC)   CKD (chronic kidney disease) stage 3, GFR 30-59 ml/min (HCC)   S/P CABG x 3   PAF (paroxysmal atrial fibrillation) (HCC)     #1 chest pain: Suspected Dressler syndrome versus pericarditis.  Patient has pericardial fluid apparently on bedside echo.  Will admit the patient for observation.  Cycle enzymes.  Check proper echocardiogram in the morning.  Cardiology consulted and will follow recommendations.  #2 paroxysmal atrial fibrillation: Currently in sinus rhythm.  Continue monitoring.  #3 status post coronary artery bypass graft: Patient is still recovering from his graft that happened about a month ago.  Vascular consultation if needed.  #4 history of prostate cancer: Currently stable.   DVT prophylaxis: Lovenox Code Status: Full code Family Communication: Wife and daughter at bedside Disposition Plan: Home Consults called: Cardiology Admission status: Observation  Severity of Illness: The appropriate patient status for this patient is OBSERVATION. Observation status is judged to be reasonable and necessary in order to provide the required intensity of service to ensure the patient's safety. The patient's presenting symptoms, physical exam findings, and initial radiographic and laboratory data in the context of their medical condition is felt to place them at decreased risk for further clinical deterioration. Furthermore, it is anticipated that the patient will be medically stable for discharge from the hospital within 2 midnights of admission. The following factors support the patient status of observation.   " The patient's presenting symptoms include chest pain. " The physical exam findings include reproducible chest wall pain. " The initial radiographic and laboratory data  are abnormal EKG.  Barbette Merino MD [facility] Pager [phone]  If 7PM-7AM, please contact night-coverage [email or web address] Password [email or web address]  06/15/2018, 10:03 PM

## 2018-06-15 NOTE — ED Notes (Signed)
Report attempted 

## 2018-06-15 NOTE — ED Notes (Signed)
ED TO INPATIENT HANDOFF REPORT  ED Nurse Name and Phone #: Benjamine Mola [phone]  S Name/Age/Gender Daniel Herrera 78 y.o. male Room/Bed: 045C/045C  Code Status   Code Status: Full Code  Home/SNF/Other Home Patient oriented to: self, place, time and situation Is this baseline? Yes   Triage Complete: Triage complete  Chief Complaint Chest Pain, Heart Surgery 2/21  Triage Note Pt c/o left sided chest pain x 1 day. Denies shortness of breath. Hx CABG 2/21.   Allergies Allergies  Allergen Reactions  . Lisinopril Swelling    Facial and lip swelling. And throat swelling.   . Benadryl [Diphenhydramine Hcl] Swelling    Facial swelling from high dose (tolerates Advil PM as needed)  . Darvocet [Propoxyphene N-Acetaminophen] Other (See Comments)    hallucination  . Darvon [Propoxyphene Hcl] Other (See Comments)    Hallucination   . Tylenol [Acetaminophen] Swelling    Foot swelling    Level of Care/Admitting Diagnosis ED Disposition    ED Disposition Condition [facility]: [place] [[id number]]  Level of Care: Telemetry Cardiac [103]  I expect the patient will be discharged within 24 hours: Yes  LOW acuity---Tx typically complete <24 hrs---ACUTE conditions typically can be evaluated <24 hours---LABS likely to return to acceptable levels <24 hours---IS near functional baseline---EXPECTED to return to current living arrangement---NOT newly hypoxic: Meets criteria for 5C-Observation unit  Diagnosis: Chest pain [295284]  Admitting Physician: Elwyn Reach [[id number]]  Attending Physician: Elwyn Reach [[id number]]  PT Class (Do Not Modify): Observation [104]  PT Acc Code (Do Not Modify): Observation [10022]       B Medical/Surgery History Past Medical History:  Diagnosis Date  . Angio-edema   . Anxiety   . Arthritis   . CAD (coronary artery disease)    a. 02/2015: DES to mid-LAD  . GERD (gastroesophageal reflux disease)   . Gout    . High cholesterol   . History of prolonged Q-T interval on ECG   . Hx of Clostridium difficile infection   . Hx of umbilical hernia repair   . Personal history of digestive disease    gastric ulcer  . Prostate cancer ([place])   . Prostate enlargement   . Skin cancer    basal cell carcinoma  . Thyroid disease   . Urticaria    Past Surgical History:  Procedure Laterality Date  . APPENDECTOMY    . BACK SURGERY    . CARDIAC CATHETERIZATION N/A 02/21/2015   Procedure: Left Heart Cath;  Surgeon: Lorretta Harp, MD;  Location: [facility];  Service: Cardiovascular;  Laterality: N/A;  . CATARACT EXTRACTION, BILATERAL    . CORONARY ARTERY BYPASS GRAFT N/A 05/24/2018   Procedure: CORONARY ARTERY BYPASS GRAFTING (CABG), ON PUMP, TIMES THREE, USING BILATERAL INTERNAL MAMMARY ARTERY AND HARVESTED LEFT RADIAL ARTERY;  Surgeon: Gaye Pollack, MD;  Location: [facility];  Service: Open Heart Surgery;  Laterality: N/A;  . LEFT HEART CATH AND CORONARY ANGIOGRAPHY N/A 05/22/2018   Procedure: LEFT HEART CATH AND CORONARY ANGIOGRAPHY;  Surgeon: Jettie Booze, MD;  Location: [place] [facility];  Service: Cardiovascular;  Laterality: N/A;  . NASAL SINUS SURGERY    . PROSTATE BIOPSY    . RADIAL ARTERY HARVEST Left 05/24/2018   Procedure: RADIAL ARTERY HARVEST;  Surgeon: Gaye Pollack, MD;  Location: [facility];  Service: Open Heart Surgery;  Laterality: Left;  . right shoulder rotator cuff repair    .  TEE WITHOUT CARDIOVERSION N/A 05/24/2018   Procedure: TRANSESOPHAGEAL ECHOCARDIOGRAM (TEE);  Surgeon: Gaye Pollack, MD;  Location: [place];  Service: Open Heart Surgery;  Laterality: N/A;     A IV Location/Drains/Wounds Patient Lines/Drains/Airways Status   Active Line/Drains/Airways    Name:   Placement date:   Placement time:   Site:   Days:   Peripheral IV 05/24/18 Right;Lateral Forearm   05/24/18    0540    Forearm   22   Peripheral IV 06/15/18 Right Antecubital   06/15/18    1911     Antecubital   less than 1   Incision (Closed) 05/24/18 Arm Left   05/24/18    1042     22   Incision (Closed) 05/24/18 Chest Other (Comment)   05/24/18    1231     22          Intake/Output Last 24 hours No intake or output data in the 24 hours ending 06/15/18 1917  Labs/Imaging Results for orders placed or performed during the hospital encounter of 06/15/18 (from the past 48 hour(s))  Basic metabolic panel     Status: Abnormal   Collection Time: 06/15/18  5:17 PM  Result Value Ref Range   Sodium 136 135 - 145 mmol/L   Potassium 4.0 3.5 - 5.1 mmol/L   Chloride 105 98 - 111 mmol/L   CO2 24 22 - 32 mmol/L   Glucose, Bld 183 (H) 70 - 99 mg/dL   BUN 23 8 - 23 mg/dL   Creatinine, Ser 1.53 (H) 0.61 - 1.24 mg/dL   Calcium 8.7 (L) 8.9 - 10.3 mg/dL   GFR calc non Af Amer 43 (L) >60 mL/min   GFR calc Af Amer 49 (L) >60 mL/min   Anion gap 7 5 - 15    Comment: Performed at [facility], [address] [place], [place] [address]  CBC     Status: Abnormal   Collection Time: 06/15/18  5:17 PM  Result Value Ref Range   WBC 6.3 4.0 - 10.5 K/uL   RBC 3.85 (L) 4.22 - 5.81 MIL/uL   Hemoglobin 11.2 (L) 13.0 - 17.0 g/dL   HCT 36.1 (L) 39.0 - 52.0 %   MCV 93.8 80.0 - 100.0 fL   MCH 29.1 26.0 - 34.0 pg   MCHC 31.0 30.0 - 36.0 g/dL   RDW 13.4 11.5 - 15.5 %   Platelets 273 150 - 400 K/uL   nRBC 0.0 0.0 - 0.2 %    Comment: Performed at [facility], [employer] [address] [place], [place] [address]  I-stat troponin, ED     Status: None   Collection Time: 06/15/18  5:22 PM  Result Value Ref Range   Troponin i, poc 0.01 0.00 - 0.08 ng/mL   Comment 3            Comment: Due to the release kinetics of cTnI, a negative result within the first hours of the onset of symptoms does not rule out myocardial infarction with certainty. If myocardial infarction is still suspected, repeat the test at appropriate intervals.    Dg Chest 2 View  Result Date: 06/15/2018 CLINICAL DATA:  Left chest  pain for 1 day. Status post CABG on 05/24/2018. EXAM: CHEST - 2 VIEW COMPARISON:  05/26/2018. FINDINGS: Normal sized heart with an interval decrease in size. Tortuous aorta. Post CABG changes. The right jugular catheter sheath has been removed. The lungs are hyperexpanded with stable mild  prominence of the interstitial markings and biapical pleural and parenchymal scarring. No residual atelectasis or pleural fluid is seen. Diffuse osteopenia. Right shoulder fixation anchor. IMPRESSION: 1. No acute abnormality. 2. Stable changes of COPD. Electronically Signed   By: Claudie Revering M.D.   On: 06/15/2018 18:27    Pending Labs Unresulted Labs (From admission, onward)    Start     Ordered   06/22/18 0500  Creatinine, serum  (enoxaparin (LOVENOX) full dose)  Weekly,   R    Comments:  while on enoxaparin therapy.    06/15/18 1855   06/15/18 1855  CBC  (enoxaparin (LOVENOX) full dose)  Once,   R    Comments:  Baseline for enoxaparin therapy IF NOT ALREADY DRAWN.  Notify MD if PLT < 100 K.    06/15/18 1855   06/15/18 1855  Creatinine, serum  (enoxaparin (LOVENOX) full dose)  Once,   R    Comments:  Baseline for enoxaparin therapy IF NOT ALREADY DRAWN.    06/15/18 1855          Vitals/Pain Today's Vitals   06/15/18 1745 06/15/18 1745 06/15/18 1900 06/15/18 1912  BP: 130/72  125/78   Pulse: 76  81   Resp: 19  18   Temp:      TempSrc:      SpO2: 98%  99%   Weight:  87.5 kg    Height:  6\' 1"  (1.854 m)    PainSc:    6     Isolation Precautions No active isolations  Medications Medications  sodium chloride flush (NS) 0.9 % injection 3 mL (has no administration in time range)  acetaminophen (TYLENOL) tablet 650 mg (has no administration in time range)  ondansetron (ZOFRAN) injection 4 mg (has no administration in time range)  enoxaparin (LOVENOX) injection 90 mg (has no administration in time range)    Mobility walks Low fall risk   Focused Assessments Cardiac Assessment  Handoff:  Cardiac Rhythm: Normal sinus rhythm Lab Results  Component Value Date   TROPONINI 0.25 ([place]) 05/22/2018   No results found for: DDIMER Does the Patient currently have chest pain? No     R Recommendations: See Admitting Provider Note  Report given to:   Additional Notes:

## 2018-06-15 NOTE — ED Provider Notes (Signed)
[facility] EMERGENCY DEPARTMENT Provider Note   CSN: [id number] Arrival date & time: 06/15/18  1650    History   Chief Complaint Chief Complaint  Patient presents with   Chest Pain    HPI Daniel Herrera is a 78 y.o. male w/ PMH of CAD s/p CABGx3, PAF and GERD presenting with chest pain. He was in his usual state of health when he experienced brief episode of sharp chest pain on upper chest while resting in his chair last night. The pain passed without any intervention. This morning he began to experience dull pain under his left breast and pain worsened despite taking two of his Nitrostat. He describes it as sharp pain on his left chest without radiation worsened with coughing. He denies any fevers, chills, diaphoresis, dyspnea, nausea, or vomiting. He mentions that he was able to participate in cardiac rehab yesterday without any difficulty. HPI: A 78 year old patient with a history of peripheral artery disease and hypertension presents for evaluation of chest pain. Initial onset of pain was more than 6 hours ago. The patient's chest pain is well-localized, is sharp and is not worse with exertion. The patient's chest pain is middle- or left-sided, is not described as heaviness/pressure/tightness and does not radiate to the arms/jaw/neck. The patient does not complain of nausea and denies diaphoresis. The patient has no history of stroke, has not smoked in the past 90 days, denies any history of treated diabetes, has no relevant family history of coronary artery disease (first degree relative at less than age 68), has no history of hypercholesterolemia and does not have an elevated BMI (>=30).   Past Medical History:  Diagnosis Date   Angio-edema    Anxiety    Arthritis    CAD (coronary artery disease)    a. 02/2015: DES to mid-LAD   GERD (gastroesophageal reflux disease)    Gout    High cholesterol    History of prolonged Q-T interval on ECG    Hx of  Clostridium difficile infection    Hx of umbilical hernia repair    Personal history of digestive disease    gastric ulcer   Prostate cancer ([place])    Prostate enlargement    Skin cancer    basal cell carcinoma   Thyroid disease    Urticaria    Patient Active Problem List   Diagnosis Date Noted   PAF (paroxysmal atrial fibrillation) ([place]) 06/13/2018   S/P CABG x 3 05/28/2018   NSTEMI (non-ST elevated myocardial infarction) ([place]) 05/21/2018   Tachycardia determined by examination of pulse    Pre-operative clearance 01/02/2018   Hematochezia 12/21/2017   CKD (chronic kidney disease) stage 3, GFR 30-59 ml/min (HCC) 12/21/2017   GI bleed 12/21/2017   Hereditary angioedema type 2 ([place]) 07/28/2017   Enterotoxigenic Escherichia coli infection 05/24/2015   STEC (Shiga toxin-producing Escherichia coli) infection 05/24/2015   Colitis 05/23/2015   History of prostate cancer    Chronic diarrhea of unknown origin    CAD S/P percutaneous coronary angioplasty    Type 2 diabetes mellitus with unspecified complications ([place])    Chest pain 04/08/2015   Dyspnea 04/08/2015   Palpitations 03/15/2015   History of non-ST elevation myocardial infarction (NSTEMI) 02/22/2015   Abnormal EKG 02/21/2015   Essential hypertension 02/21/2015   Dyslipidemia, goal LDL below 70 02/21/2015   Malignant neoplasm of prostate ([place]) 08/07/2014   Acute interstitial pneumonitis ([place]) 12/17/2013    Past Surgical History:  Procedure Laterality Date  APPENDECTOMY     BACK SURGERY     CARDIAC CATHETERIZATION N/A 02/21/2015   Procedure: Left Heart Cath;  Surgeon: Lorretta Harp, MD;  Location: [facility];  Service: Cardiovascular;  Laterality: N/A;   CATARACT EXTRACTION, BILATERAL     CORONARY ARTERY BYPASS GRAFT N/A 05/24/2018   Procedure: CORONARY ARTERY BYPASS GRAFTING (CABG), ON PUMP, TIMES THREE, USING BILATERAL INTERNAL MAMMARY ARTERY AND HARVESTED LEFT RADIAL  ARTERY;  Surgeon: Gaye Pollack, MD;  Location: [facility];  Service: Open Heart Surgery;  Laterality: N/A;   LEFT HEART CATH AND CORONARY ANGIOGRAPHY N/A 05/22/2018   Procedure: LEFT HEART CATH AND CORONARY ANGIOGRAPHY;  Surgeon: Jettie Booze, MD;  Location: [facility];  Service: Cardiovascular;  Laterality: N/A;   NASAL SINUS SURGERY     PROSTATE BIOPSY     RADIAL ARTERY HARVEST Left 05/24/2018   Procedure: RADIAL ARTERY HARVEST;  Surgeon: Gaye Pollack, MD;  Location: [facility];  Service: Open Heart Surgery;  Laterality: Left;   right shoulder rotator cuff repair     TEE WITHOUT CARDIOVERSION N/A 05/24/2018   Procedure: TRANSESOPHAGEAL ECHOCARDIOGRAM (TEE);  Surgeon: Gaye Pollack, MD;  Location: [facility];  Service: Open Heart Surgery;  Laterality: N/A;        Home Medications    Prior to Admission medications   Medication Sig Start Date End Date Taking? Authorizing Provider  amiodarone (PACERONE) 200 MG tablet Take 1 tablet (200 mg total) by mouth 2 (two) times daily. For 7 days, then decrease to 200 mg daily Patient taking differently: Take 200 mg by mouth daily.  05/30/18  Yes Barrett, Lodema Hong, PA-C  aspirin EC 81 MG tablet Take 1 tablet (81 mg total) by mouth daily. 01/02/18  Yes Kilroy, Luke K, PA-C  azelastine (ASTELIN) 0.1 % nasal spray Place 1 spray into both nostrils 2 (two) times daily. 12/12/17  Yes Kozlow, Donnamarie Poag, MD  C1 Esterase Inhibitor, Human, (HAEGARDA) 3000 units SOLR Inject 5,500 Units into the skin as directed. Every 2-5 days   Yes [provider]  EPINEPHrine 0.3 mg/0.3 mL IJ SOAJ injection Inject 0.3 mLs (0.3 mg total) into the muscle as needed (for allergic reaction). 05/20/18  Yes Kozlow, Donnamarie Poag, MD  fluticasone (FLONASE) 50 MCG/ACT nasal spray Place 1 spray into both nostrils daily. 12/12/17  Yes Kozlow, Donnamarie Poag, MD  isosorbide mononitrate (IMDUR) 30 MG 24 hr tablet Take 1 tablet (30 mg total) by mouth daily. 05/30/18  Yes Barrett, Erin R, PA-C    ketoconazole (NIZORAL) 2 % shampoo Apply 1 application topically 2 (two) times daily. 05/14/18  Yes [provider]  levothyroxine (SYNTHROID, LEVOTHROID) 75 MCG tablet Take 75 mcg by mouth daily before breakfast.   Yes [provider]  metoprolol tartrate (LOPRESSOR) 25 MG tablet Take 1 tablet (25 mg total) by mouth 2 (two) times daily. 05/25/15  Yes Short, Noah Delaine, MD  montelukast (SINGULAIR) 10 MG tablet Take 1 tablet (10 mg total) by mouth at bedtime. 09/12/17  Yes Kozlow, Donnamarie Poag, MD  nabumetone (RELAFEN) 750 MG tablet Take 1 tablet by mouth daily. 05/08/18  Yes [provider]  naproxen (NAPROSYN) 375 MG tablet Take 1 tablet (375 mg total) by mouth 2 (two) times daily. 02/09/18  Yes Law, [place] M, PA-C  nitroGLYCERIN (NITROSTAT) 0.4 MG SL tablet DISSOLVE 1 TABLET UNDER TONGUE EVERY 5 MINUTES FOR 3 DOSES AS NEEDED CHEST PAIN. IF NO RELIEF CALL 911 Patient taking differently: Place 0.4  mg under the tongue every 5 (five) minutes as needed for chest pain.  03/03/15  Yes Simmons, Brittainy M, PA-C  OVER THE COUNTER MEDICATION Take 1-2 capsules by mouth See admin instructions. Health Plus - Super Colon Cleanse 1 capsule in the morning and 2 capsules at night as needed for bowel movement   Yes [provider]  pantoprazole (PROTONIX) 40 MG tablet Take 1 tablet (40 mg total) by mouth 2 (two) times daily. 12/23/17  Yes Thurnell Lose, MD  Pitavastatin Calcium (LIVALO) 4 MG TABS Take 4 mg by mouth at bedtime.    Yes [provider]  Polyethyl Glycol-Propyl Glycol (SYSTANE OP) Place 1 drop into both eyes 2 (two) times daily.   Yes [provider]  traMADol (ULTRAM) 50 MG tablet Take 1 tablet (50 mg total) by mouth every 6 (six) hours as needed for moderate pain. 05/30/18  Yes Barrett, Erin R, PA-C  triamcinolone cream (KENALOG) 0.5 % Apply 1 application topically daily. 04/06/18  Yes [provider]  UNABLE TO [place] injected into the  skin every 2 weeks   Yes [provider]  polyethylene glycol (MIRALAX) packet Take 17 g by mouth daily. Patient not taking: Reported on 06/15/2018 12/23/17   Thurnell Lose, MD    Family History Family History  Problem Relation Age of Onset   Cancer Mother    Cancer Father        prostate   Cancer Sister     Social History Social History   Tobacco Use   Smoking status: Never Smoker   Smokeless tobacco: Never Used  Substance Use Topics   Alcohol use: No   Drug use: No     Allergies   Lisinopril; Benadryl [diphenhydramine hcl]; Darvocet [propoxyphene n-acetaminophen]; Darvon [propoxyphene hcl]; and Tylenol [acetaminophen]   Review of Systems Review of Systems  Constitutional: Negative for chills, fatigue and fever.  Respiratory: Negative for cough, shortness of breath and wheezing.   Cardiovascular: Positive for chest pain. Negative for palpitations and leg swelling.  Gastrointestinal: Negative for constipation, diarrhea, nausea and vomiting.  Neurological: Positive for light-headedness and headaches. Negative for dizziness, weakness and numbness.  All other systems reviewed and are negative.    Physical Exam Updated Vital Signs BP (!) 152/80 (BP Location: Right Arm)    Pulse 84    Temp 98.1 F (36.7 C) (Oral)    Resp 18    Ht 6\' 1"  (1.854 m)    Wt 87.5 kg    SpO2 99%    BMI 25.46 kg/m   Physical Exam Constitutional:      General: He is not in acute distress.    Appearance: He is normal weight.  HENT:     Head: Normocephalic and atraumatic.  Neck:     Musculoskeletal: Normal range of motion and neck supple.     Vascular: No JVD.  Cardiovascular:     Rate and Rhythm: Normal rate and regular rhythm.     Heart sounds: Heart sounds are distant. No murmur. No friction rub.  Pulmonary:     Effort: Pulmonary effort is normal.     Breath sounds: Normal breath sounds. No wheezing or rhonchi.  Chest:     Chest wall: Tenderness (point tenderness to  palpation on LSB) present.  Abdominal:     General: Bowel sounds are normal.     Palpations: Abdomen is soft.  Musculoskeletal: Normal range of motion.     Right lower leg: No edema.  Left lower leg: No edema.  Skin:    General: Skin is warm and dry.     Comments: Surgical scars with tenderness to palpation without surrounding erythema, edema or drainage  Neurological:     Mental Status: He is alert.    ED Treatments / Results  Labs (all labs ordered are listed, but only abnormal results are displayed) Labs Reviewed  BASIC METABOLIC PANEL - Abnormal; Notable for the following components:      Result Value   Glucose, Bld 183 (*)    Creatinine, Ser 1.53 (*)    Calcium 8.7 (*)    GFR calc non Af Amer 43 (*)    GFR calc Af Amer 49 (*)    All other components within normal limits  CBC - Abnormal; Notable for the following components:   RBC 3.85 (*)    Hemoglobin 11.2 (*)    HCT 36.1 (*)    All other components within normal limits  I-STAT TROPONIN, ED    EKG None No significant change from prior  Radiology Dg Chest 2 View  Result Date: 06/15/2018 CLINICAL DATA:  Left chest pain for 1 day. Status post CABG on 05/24/2018. EXAM: CHEST - 2 VIEW COMPARISON:  05/26/2018. FINDINGS: Normal sized heart with an interval decrease in size. Tortuous aorta. Post CABG changes. The right jugular catheter sheath has been removed. The lungs are hyperexpanded with stable mild prominence of the interstitial markings and biapical pleural and parenchymal scarring. No residual atelectasis or pleural fluid is seen. Diffuse osteopenia. Right shoulder fixation anchor. IMPRESSION: 1. No acute abnormality. 2. Stable changes of COPD. Electronically Signed   By: Claudie Revering M.D.   On: 06/15/2018 18:27    Procedures Procedures (including critical care time)  Medications Ordered in ED Medications  sodium chloride flush (NS) 0.9 % injection 3 mL (has no administration in time range)  acetaminophen  (TYLENOL) tablet 650 mg (has no administration in time range)  ondansetron (ZOFRAN) injection 4 mg (has no administration in time range)  enoxaparin (LOVENOX) injection 90 mg (has no administration in time range)     Initial Impression / Assessment and Plan / ED Course  I have reviewed the triage vital signs and the nursing notes.  Pertinent labs & imaging results that were available during my care of the patient were reviewed by me and considered in my medical decision making (see chart for details).  Mr.Suski is a 78 yo M w/ PMH of CAD s/p CABG on 05/24/18 presenting with chest pain. Appears atypical in nature but patient is high risk due to recent CABG. POC ultrasound performed showing mild pericardial effusion. Normal trops and EKG similar to prior. Will consult cardiology.  Discussed with cardiology about the case. Requesting admission to medicine with cards as consult.  Final Clinical Impressions(s) / ED Diagnoses   Final diagnoses:  Nonspecific chest pain   Mr.Severe is a 78 yo M w/ PMH of CAD s/p CABG on 05/24/18 presenting with atypical chest pain. Will be admit to medicine with cardiology as consult.  ED Discharge Orders    None       Mosetta Anis, MD 06/15/18 2200    Drenda Freeze, MD 06/15/18 [phone]

## 2018-06-16 ENCOUNTER — Observation Stay (HOSPITAL_BASED_OUTPATIENT_CLINIC_OR_DEPARTMENT_OTHER): Payer: Medicare Other

## 2018-06-16 DIAGNOSIS — I480 Paroxysmal atrial fibrillation: Secondary | ICD-10-CM | POA: Diagnosis not present

## 2018-06-16 DIAGNOSIS — I340 Nonrheumatic mitral (valve) insufficiency: Secondary | ICD-10-CM | POA: Diagnosis not present

## 2018-06-16 DIAGNOSIS — Z951 Presence of aortocoronary bypass graft: Secondary | ICD-10-CM | POA: Diagnosis not present

## 2018-06-16 DIAGNOSIS — E785 Hyperlipidemia, unspecified: Secondary | ICD-10-CM | POA: Diagnosis not present

## 2018-06-16 DIAGNOSIS — I25708 Atherosclerosis of coronary artery bypass graft(s), unspecified, with other forms of angina pectoris: Secondary | ICD-10-CM

## 2018-06-16 DIAGNOSIS — R079 Chest pain, unspecified: Secondary | ICD-10-CM

## 2018-06-16 DIAGNOSIS — I313 Pericardial effusion (noninflammatory): Secondary | ICD-10-CM | POA: Diagnosis not present

## 2018-06-16 LAB — ECHOCARDIOGRAM COMPLETE
Height: 73 in
WEIGHTICAEL: 3040 [oz_av]

## 2018-06-16 MED ORDER — AMIODARONE HCL 200 MG PO TABS: 200.0000 mg | ORAL_TABLET | Freq: Every day | ORAL | 1 refills | Status: DC

## 2018-06-16 MED ORDER — LEVOTHYROXINE SODIUM 75 MCG PO TABS
75.0000 ug | ORAL_TABLET | Freq: Every day | ORAL | Status: DC
  Administered 2018-06-16: 75 ug via ORAL
  Filled 2018-06-16: qty 1

## 2018-06-16 NOTE — Progress Notes (Signed)
  Echocardiogram 2D Echocardiogram has been performed.  Daniel Herrera 06/16/2018, 11:06 AM

## 2018-06-16 NOTE — Progress Notes (Signed)
Progress Note  Patient Name: Daniel Herrera Date of Encounter: 06/16/2018  Primary Cardiologist: Quay Burow, MD   Subjective   He denies chest pain, palpitations, and shortness of breath.  He is eager to eat and go home.  Inpatient Medications    Scheduled Meds: . enoxaparin (LOVENOX) injection  1 mg/kg Subcutaneous Q12H  . Melatonin  3 mg Oral QHS  . sodium chloride flush  3 mL Intravenous Once   Continuous Infusions:  PRN Meds: acetaminophen, ondansetron (ZOFRAN) IV   Vital Signs    Vitals:   06/15/18 1900 06/15/18 2104 06/16/18 0455 06/16/18 0459  BP: 125/78 (!) 152/80  108/62  Pulse: 81 84  64  Resp: 18 18 17    Temp:  98.1 F (36.7 C)  98.6 F (37 C)  TempSrc:  Oral  Oral  SpO2: 99% 99%  98%  Weight:    86.2 kg  Height:        Intake/Output Summary (Last 24 hours) at 06/16/2018 1218 Last data filed at 06/16/2018 0800 Gross per 24 hour  Intake -  Output 80 ml  Net -80 ml   Filed Weights   06/15/18 1745 06/16/18 0459  Weight: 87.5 kg 86.2 kg    Telemetry    Sinus rhythm with excess artifact- Personally Reviewed  ECG    No new tracings- Personally Reviewed  Physical Exam   GEN: No acute distress.   Neck: No JVD Cardiac: RRR, no murmurs, rubs, or gallops.  Respiratory: Clear to auscultation bilaterally. GI: Soft, nontender, non-distended  MS: No edema; No deformity. Neuro:  Nonfocal  Psych: Normal affect   Labs    Chemistry Recent Labs  Lab 06/15/18 1717  NA 136  K 4.0  CL 105  CO2 24  GLUCOSE 183*  BUN 23  CREATININE 1.53*  CALCIUM 8.7*  GFRNONAA 43*  GFRAA 49*  ANIONGAP 7     Hematology Recent Labs  Lab 06/15/18 1717  WBC 6.3  RBC 3.85*  HGB 11.2*  HCT 36.1*  MCV 93.8  MCH 29.1  MCHC 31.0  RDW 13.4  PLT 273    Cardiac EnzymesNo results for input(s): TROPONINI in the last 168 hours.  Recent Labs  Lab 06/15/18 1722  TROPIPOC 0.01     BNPNo results for input(s): BNP, PROBNP in the last 168 hours.   DDimer No results for input(s): DDIMER in the last 168 hours.   Radiology    Dg Chest 2 View  Result Date: 06/15/2018 CLINICAL DATA:  Left chest pain for 1 day. Status post CABG on 05/24/2018. EXAM: CHEST - 2 VIEW COMPARISON:  05/26/2018. FINDINGS: Normal sized heart with an interval decrease in size. Tortuous aorta. Post CABG changes. The right jugular catheter sheath has been removed. The lungs are hyperexpanded with stable mild prominence of the interstitial markings and biapical pleural and parenchymal scarring. No residual atelectasis or pleural fluid is seen. Diffuse osteopenia. Right shoulder fixation anchor. IMPRESSION: 1. No acute abnormality. 2. Stable changes of COPD. Electronically Signed   By: Claudie Revering M.D.   On: 06/15/2018 18:27    Cardiac Studies   Echocardiogram 06/16/2018:  IMPRESSIONS    1. The left ventricle has normal systolic function with an ejection fraction of 60-65%. The cavity size was normal. Left ventricular diastolic Doppler parameters are consistent with impaired relaxation. Indeterminate filling pressures No evidence of  left ventricular regional wall motion abnormalities.  2. The right ventricle has normal systolic function. The cavity was normal. There is  no increase in right ventricular wall thickness.  3. The mitral valve is grossly normal.  4. The tricuspid valve is grossly normal.  5. The aortic valve is grossly normal.  6. The aortic root is normal in size and structure.  7. No significant pericardial effusion. Prominent pericardial fat pad. No hemodynamic compromise.  Patient Profile     78 y.o. male with coronary artery disease and recent CABG who was admitted with chest pain.  Assessment & Plan    1.  Chest pain with recent CABG: Symptoms have resolved.  They were atypical for an ischemic etiology.  No signs of pericarditis on ECG or physical exam.  Initial troponin was normal.  Continue aspirin, metoprolol, nitrates, and statin therapy at  time of discharge.  2.  Pericardial effusion: ED physician performed a bedside echo and described a very small pericardial effusion.  He is hemodynamically stable.  Full 2D echocardiogram with Doppler does not demonstrate any significant effusion or constriction.  There is also no evidence of tamponade physiology.  3.  Atrial fibrillation: Currently in normal sinus rhythm and on amiodarone.  Not currently on anticoagulation.  4.  Hyperlipidemia: Continue statin.   [facility] will sign off.   Medication Recommendations: Continue outpatient cardiac medication regimen Other recommendations (labs, testing, etc): None Follow up as an outpatient: We will arrange  For questions or updates, please contact [facility] Please consult [email or web address] for contact info under Cardiology/STEMI.      Signed, Kate Sable, MD  06/16/2018, 12:18 PM

## 2018-06-16 NOTE — Care Management Obs Status (Signed)
[place] NOTIFICATION   Patient Details  Name: Daniel Herrera MRN: [id number] Date of Birth: 10/28/1940   Medicare Observation Status Notification Given:  Yes    Royston Bake, RN 06/16/2018, 12:20 PM

## 2018-06-16 NOTE — Discharge Summary (Signed)
Physician Discharge Summary  Daniel Herrera ASN:[id number] DOB: 01/16/1941 DOA: 06/15/2018  PCP: [name], NP  Admit date: 06/15/2018 Discharge date: 06/16/2018  Time spent: 45 minutes  Recommendations for Outpatient Follow-up:  1. Cardiology will contact for follow up appointment  Discharge Diagnoses:  Principal Problem:   Chest pain Active Problems:   Malignant neoplasm of prostate (HCC)   CKD (chronic kidney disease) stage 3, GFR 30-59 ml/min (HCC)   S/P CABG x 3   PAF (paroxysmal atrial fibrillation) ([place])   Discharge Condition: stable pain free  Diet recommendation: heart healthy  Filed Weights   06/15/18 1745 06/16/18 0459  Weight: 87.5 kg 86.2 kg    History of present illness:  78 y.o. male with coronary artery disease and recent CABG who was admitted with chest pain. Developed left sided chest pain/pressure. No nausea/diaphoresis or sob.  Hospital Course:  #1 chest pain: pain free at discharge. Evaluated by cardiology who opine chest pain atypical for ischemic etiology with no signs of pericarditis on ECG or physical exam. Trop negative. Recommended continuing asa, BB, nitrites and statin.   #2 paroxysmal atrial fibrillation: remained in sinus rhythm.  Continue monitoring.  #3 status post coronary artery bypass graft: stable. Staples healing  #4 history of prostate cancer: Currently stable  #5. Pericardial effusion. Evidently ED MD performed bedside echo and described small pericardial effusion. 2decho does no demonstrate any significant effusion or constriction per cardiology  Procedures: Echo with 1. The left ventricle has normal systolic function with an ejection fraction of 60-65%. The cavity size was normal. Left ventricular diastolic Doppler parameters are consistent with impaired relaxation. Indeterminate filling pressures No evidence of  left ventricular regional wall motion abnormalities. 2. The right ventricle has normal systolic function. The  cavity was normal. There is no increase in right ventricular wall thickness. 3. The mitral valve is grossly normal. 4. The tricuspid valve is grossly normal. 5. The aortic valve is grossly normal. 6. The aortic root is normal in size and structure. 7. No significant pericardial effusion. Prominent pericardial fat pad. No hemodynamic compromise.  Consultations:  [facility] cardiology  Discharge Exam: Vitals:   06/16/18 0455 06/16/18 0459  BP:  108/62  Pulse:  64  Resp: 17   Temp:  98.6 F (37 C)  SpO2:  98%    General: awake alert oriented x3 Cardiovascular: rrr no mgr no LE edema Respiratory: normal effort BS clear bilaterally no wheeze  Discharge Instructions    Allergies as of 06/16/2018      Reactions   Lisinopril Swelling   Facial and lip swelling. And throat swelling.    Benadryl [diphenhydramine Hcl] Swelling   Facial swelling from high dose (tolerates Advil PM as needed)   Darvocet [propoxyphene N-acetaminophen] Other (See Comments)   hallucination   Darvon [propoxyphene Hcl] Other (See Comments)   Hallucination   Tylenol [acetaminophen] Swelling   Foot swelling      Medication List    STOP taking these medications   polyethylene glycol packet Commonly known as:  MiraLax     TAKE these medications   amiodarone 200 MG tablet Commonly known as:  PACERONE Take 1 tablet (200 mg total) by mouth daily.   aspirin EC 81 MG tablet Take 1 tablet (81 mg total) by mouth daily.   azelastine 0.1 % nasal spray Commonly known as:  ASTELIN Place 1 spray into both nostrils 2 (two) times daily.   EPINEPHrine 0.3 mg/0.3 mL Soaj injection Commonly known as:  EPI-PEN  Inject 0.3 mLs (0.3 mg total) into the muscle as needed (for allergic reaction).   fluticasone 50 MCG/ACT nasal spray Commonly known as:  FLONASE Place 1 spray into both nostrils daily.   Haegarda 3000 units Solr Generic drug:  C1 Esterase Inhibitor (Human) Inject 5,500 Units into the skin as  directed. Every 2-5 days   isosorbide mononitrate 30 MG 24 hr tablet Commonly known as:  IMDUR Take 1 tablet (30 mg total) by mouth daily.   ketoconazole 2 % shampoo Commonly known as:  NIZORAL Apply 1 application topically 2 (two) times daily.   levothyroxine 75 MCG tablet Commonly known as:  SYNTHROID, LEVOTHROID Take 75 mcg by mouth daily before breakfast.   Livalo 4 MG Tabs Generic drug:  Pitavastatin Calcium Take 4 mg by mouth at bedtime.   metoprolol tartrate 25 MG tablet Commonly known as:  LOPRESSOR Take 1 tablet (25 mg total) by mouth 2 (two) times daily.   montelukast 10 MG tablet Commonly known as:  SINGULAIR Take 1 tablet (10 mg total) by mouth at bedtime.   nabumetone 750 MG tablet Commonly known as:  RELAFEN Take 1 tablet by mouth daily.   naproxen 375 MG tablet Commonly known as:  NAPROSYN Take 1 tablet (375 mg total) by mouth 2 (two) times daily.   nitroGLYCERIN 0.4 MG SL tablet Commonly known as:  NITROSTAT DISSOLVE 1 TABLET UNDER TONGUE EVERY 5 MINUTES FOR 3 DOSES AS NEEDED CHEST PAIN. IF NO RELIEF CALL 911 What changed:  See the new instructions.   OVER THE COUNTER MEDICATION Take 1-2 capsules by mouth See admin instructions. Health Plus - Super Colon Cleanse 1 capsule in the morning and 2 capsules at night as needed for bowel movement   pantoprazole 40 MG tablet Commonly known as:  PROTONIX Take 1 tablet (40 mg total) by mouth 2 (two) times daily.   SYSTANE OP Place 1 drop into both eyes 2 (two) times daily.   traMADol 50 MG tablet Commonly known as:  ULTRAM Take 1 tablet (50 mg total) by mouth every 6 (six) hours as needed for moderate pain.   triamcinolone cream 0.5 % Commonly known as:  KENALOG Apply 1 application topically daily.   UNABLE TO FIND Takhzryo injected into the skin every 2 weeks      Allergies  Allergen Reactions  . Lisinopril Swelling    Facial and lip swelling. And throat swelling.   . Benadryl [Diphenhydramine  Hcl] Swelling    Facial swelling from high dose (tolerates Advil PM as needed)  . Darvocet [Propoxyphene N-Acetaminophen] Other (See Comments)    hallucination  . Darvon [Propoxyphene Hcl] Other (See Comments)    Hallucination   . Tylenol [Acetaminophen] Swelling    Foot swelling      The results of significant diagnostics from this hospitalization (including imaging, microbiology, ancillary and laboratory) are listed below for reference.    Significant Diagnostic Studies: Dg Chest 2 View  Result Date: 06/15/2018 CLINICAL DATA:  Left chest pain for 1 day. Status post CABG on 05/24/2018. EXAM: CHEST - 2 VIEW COMPARISON:  05/26/2018. FINDINGS: Normal sized heart with an interval decrease in size. Tortuous aorta. Post CABG changes. The right jugular catheter sheath has been removed. The lungs are hyperexpanded with stable mild prominence of the interstitial markings and biapical pleural and parenchymal scarring. No residual atelectasis or pleural fluid is seen. Diffuse osteopenia. Right shoulder fixation anchor. IMPRESSION: 1. No acute abnormality. 2. Stable changes of COPD. Electronically Signed   By:  Claudie Revering M.D.   On: 06/15/2018 18:27   Dg Chest 2 View  Result Date: 05/20/2018 CLINICAL DATA:  Chest pain EXAM: CHEST - 2 VIEW COMPARISON:  12/21/2017, 03/13/2018 FINDINGS: Diffuse reticular opacity consistent with chronic interstitial disease. No acute consolidation or effusion. Normal cardiomediastinal silhouette. No pneumothorax. Apical pleural and parenchymal scarring. Displaced fracture involving the distal left clavicle. IMPRESSION: No active cardiopulmonary disease. Displaced fracture involving the distal left clavicle, noted on comparison study from 03/13/2018 Electronically Signed   By: Donavan Foil M.D.   On: 05/20/2018 23:13   Dg Chest Port 1 View  Result Date: 05/26/2018 CLINICAL DATA:  History of CABG. EXAM: PORTABLE CHEST 1 VIEW COMPARISON:  05/25/2018 FINDINGS: Postsurgical  changes from CABG. Mediastinal/thoracic drains have been removed. The right IJ venous sheath remains. Mildly enlarged cardiac silhouette. Bilateral pleural effusions. Bibasilar atelectasis versus airspace consolidation. IMPRESSION: Bilateral pleural effusions and bibasilar atelectasis versus airspace consolidation. Electronically Signed   By: Fidela Salisbury M.D.   On: 05/26/2018 09:06   Dg Chest Port 1 View  Result Date: 05/25/2018 CLINICAL DATA:  Status post CABG x3. EXAM: PORTABLE CHEST 1 VIEW COMPARISON:  05/24/2018 FINDINGS: Right IJ Swan-Ganz catheter unchanged with tip at pulmonary outflow tract. Mediastinal drain. Left chest tube. Extubation. Removal of nasogastric tube. Right chest tube unchanged. Cardiomegaly accentuated by AP portable technique. Tortuous thoracic aorta. Trace left pleural fluid or thickening, similar. No pneumothorax. No congestive failure. Improved left base atelectasis. IMPRESSION: Removal of some support apparatus, as detailed above. Similar trace left pleural fluid or thickening with improved left base atelectasis. Electronically Signed   By: Abigail Miyamoto M.D.   On: 05/25/2018 08:44   Dg Chest Port 1 View  Result Date: 05/24/2018 CLINICAL DATA:  Status post CABG x3.  Status post intubation. EXAM: PORTABLE CHEST 1 VIEW COMPARISON:  May 20, 2018 FINDINGS: Patient status post prior median sternotomy and CABG. Endotracheal tube is identified with distal tip 6.5 cm from carina. Mediastinal drain and bilateral chest tubes are in good position. Swan-Ganz catheter is identified in good position. Nasogastric tube is identified with distal tip probably at the GE junction/ proximal stomach. There is no pneumothorax. Mild left lung base atelectasis with left pleural effusion is identified. Bony structures are stable. IMPRESSION: Left supporting devices as described. No pneumothorax. Mild left lung base atelectasis with left pleural effusion. Electronically Signed   By: Abelardo Diesel M.D.   On: 05/24/2018 15:59   Vas [place] Lower Extremity Saphenous Vein Mapping  Result Date: 05/23/2018 LOWER EXTREMITY VEIN MAPPING Indications:        Pre-op Risk Factors:       Coronary artery disease. Other Risk Factors: History of bilateral lower extremity venous ablation.  Performing Technologist: Maudry Mayhew MHA, RDMS, RVT, RDCS  Examination Guidelines: A complete evaluation includes B-mode imaging, spectral Doppler, color Doppler, and power Doppler as needed of all accessible portions of each vessel. Bilateral testing is considered an integral part of a complete examination. Limited examinations for reoccurring indications may be performed as noted. +----------------+-----------+--------------+----------------+--------------+ RT Diameter (cm)RT FindingsVaricose Vein LT Diameter (cm) LT Findings   +----------------+-----------+--------------+----------------+--------------+       0.20                 Proximal thigh                not visualized +----------------+-----------+--------------+----------------+--------------+       0.23       branching   Mid thigh  not visualized +----------------+-----------+--------------+----------------+--------------+       0.19       branching  Distal thigh                 not visualized +----------------+-----------+--------------+----------------+--------------+       0.17                      Knee           0.17                     +----------------+-----------+--------------+----------------+--------------+       0.17                   Prox calf         0.24        branching    +----------------+-----------+--------------+----------------+--------------+       0.22       branching    Mid calf         0.16                     +----------------+-----------+--------------+----------------+--------------+       0.30       branching  Distal calf        0.19                      +----------------+-----------+--------------+----------------+--------------+ Summary:   Native GSV was not visualized bilaterally.   A superficial vein, likely a varicose vein, was visualized  bilaterally, and were noted to be tortuous with multiple branches.  *See table(s) above for measurements and observations. Diagnosing physician: Harold Barban MD Electronically signed by Harold Barban MD on 05/23/2018 at 4:10:49 PM.    Final    Vas US Doppler Pre Cabg  Result Date: 05/22/2018 PREOPERATIVE VASCULAR EVALUATION  Indications:  Pre cabg. Risk Factors: Hypertension. Performing Technologist: Abram Sander RVS  Examination Guidelines: A complete evaluation includes B-mode imaging, spectral Doppler, color Doppler, and power Doppler as needed of all accessible portions of each vessel. Bilateral testing is considered an integral part of a complete examination. Limited examinations for reoccurring indications may be performed as noted.  Right Carotid Findings: +----------+--------+--------+--------+-----------+--------+           PSV cm/sEDV cm/sStenosisDescribe   Comments +----------+--------+--------+--------+-----------+--------+ CCA Prox  69      12              homogeneous         +----------+--------+--------+--------+-----------+--------+ CCA Distal62      16              homogeneous         +----------+--------+--------+--------+-----------+--------+ ICA Prox  62      16      1-39%   homogeneous         +----------+--------+--------+--------+-----------+--------+ ICA Distal68      23                                  +----------+--------+--------+--------+-----------+--------+ ECA       71      8                                   +----------+--------+--------+--------+-----------+--------+ Portions of this table do not appear on this page. +----------+--------+-------+--------+------------+  PSV cm/sEDV cmsDescribeArm Pressure  +----------+--------+-------+--------+------------+ Subclavian93                     137          +----------+--------+-------+--------+------------+ +---------+--------+--+--------+--+---------+ VertebralPSV cm/s51EDV cm/s14Antegrade +---------+--------+--+--------+--+---------+ Left Carotid Findings: +----------+--------+--------+--------+-----------+--------+           PSV cm/sEDV cm/sStenosisDescribe   Comments +----------+--------+--------+--------+-----------+--------+ CCA Prox  79      20              homogeneous         +----------+--------+--------+--------+-----------+--------+ CCA Distal71      20              homogeneous         +----------+--------+--------+--------+-----------+--------+ ICA Prox  84      22      1-39%   homogeneous         +----------+--------+--------+--------+-----------+--------+ ICA Distal90      29                                  +----------+--------+--------+--------+-----------+--------+ ECA       82      10                                  +----------+--------+--------+--------+-----------+--------+ +----------+--------+--------+--------+------------+ SubclavianPSV cm/sEDV cm/sDescribeArm Pressure +----------+--------+--------+--------+------------+           86                      137          +----------+--------+--------+--------+------------+ +---------+--------+--+--------+--+---------+ VertebralPSV cm/s42EDV cm/s11Antegrade +---------+--------+--+--------+--+---------+  ABI Findings: +--------+------------------+-----+---------+--------+ Right   Rt Pressure (mmHg)IndexWaveform Comment  +--------+------------------+-----+---------+--------+ [id number]                    triphasic         +--------+------------------+-----+---------+--------+ PTA     157               1.15 triphasic         +--------+------------------+-----+---------+--------+ DP      152               1.11  triphasic         +--------+------------------+-----+---------+--------+ +--------+------------------+-----+---------+-------+ Left    Lt Pressure (mmHg)IndexWaveform Comment +--------+------------------+-----+---------+-------+ [id number]                    triphasic        +--------+------------------+-----+---------+-------+ PTA     174               1.27 triphasic        +--------+------------------+-----+---------+-------+ DP      157               1.15 triphasic        +--------+------------------+-----+---------+-------+ +-------+---------------+----------------+ ABI/TBIToday's ABI/TBIPrevious ABI/TBI +-------+---------------+----------------+ Right  1.15                            +-------+---------------+----------------+ Left   1.27                            +-------+---------------+----------------+  Right Doppler Findings: +--------+--------+-----+---------+--------+ Site    PressureIndexDoppler  Comments +--------+--------+-----+---------+--------+ [id number]          triphasic         +--------+--------+-----+---------+--------+  Radial               triphasic         +--------+--------+-----+---------+--------+ Ulnar                triphasic         +--------+--------+-----+---------+--------+  Left Doppler Findings: +--------+--------+-----+---------+--------+ Site    PressureIndexDoppler  Comments +--------+--------+-----+---------+--------+ [id number]          triphasic         +--------+--------+-----+---------+--------+ Radial               triphasic         +--------+--------+-----+---------+--------+ Ulnar                triphasic         +--------+--------+-----+---------+--------+  Summary: Right Carotid: Velocities in the right ICA are consistent with a 1-39% stenosis. Left Carotid: Velocities in the left ICA are consistent with a 1-39% stenosis. Vertebrals: Bilateral vertebral arteries demonstrate  antegrade flow. Right ABI: Resting right ankle-brachial index is within normal range. No evidence of significant right lower extremity arterial disease. Left ABI: Resting left ankle-brachial index is within normal range. No evidence of significant left lower extremity arterial disease. Right Upper Extremity: Unable to obtain due to tr band. Left Upper Extremity: Doppler waveforms remain within normal limits with left radial compression. Doppler waveforms decrease 50% with left ulnar compression.  Electronically signed by Servando Snare MD on 05/22/2018 at 5:31:06 PM.    Final     Microbiology: No results found for this or any previous visit (from the past 240 hour(s)).   Labs: Basic Metabolic Panel: Recent Labs  Lab 06/15/18 1717  NA 136  K 4.0  CL 105  CO2 24  GLUCOSE 183*  BUN 23  CREATININE 1.53*  CALCIUM 8.7*   Liver Function Tests: No results for input(s): AST, ALT, ALKPHOS, BILITOT, PROT, ALBUMIN in the last 168 hours. No results for input(s): LIPASE, AMYLASE in the last 168 hours. No results for input(s): AMMONIA in the last 168 hours. CBC: Recent Labs  Lab 06/15/18 1717  WBC 6.3  HGB 11.2*  HCT 36.1*  MCV 93.8  PLT 273   Cardiac Enzymes: No results for input(s): CKTOTAL, CKMB, CKMBINDEX, TROPONINI in the last 168 hours. BNP: BNP (last 3 results) No results for input(s): BNP in the last 8760 hours.  ProBNP (last 3 results) No results for input(s): PROBNP in the last 8760 hours.  CBG: No results for input(s): GLUCAP in the last 168 hours.     SignedRadene Gunning NP [facility] 06/16/2018, 1:16 PM

## 2018-06-17 DIAGNOSIS — I4891 Unspecified atrial fibrillation: Secondary | ICD-10-CM | POA: Diagnosis not present

## 2018-06-17 DIAGNOSIS — I214 Non-ST elevation (NSTEMI) myocardial infarction: Secondary | ICD-10-CM | POA: Diagnosis not present

## 2018-06-17 DIAGNOSIS — I1310 Hypertensive heart and chronic kidney disease without heart failure, with stage 1 through stage 4 chronic kidney disease, or unspecified chronic kidney disease: Secondary | ICD-10-CM | POA: Diagnosis not present

## 2018-06-17 DIAGNOSIS — E1122 Type 2 diabetes mellitus with diabetic chronic kidney disease: Secondary | ICD-10-CM | POA: Diagnosis not present

## 2018-06-17 DIAGNOSIS — I2510 Atherosclerotic heart disease of native coronary artery without angina pectoris: Secondary | ICD-10-CM | POA: Diagnosis not present

## 2018-06-17 DIAGNOSIS — Z48812 Encounter for surgical aftercare following surgery on the circulatory system: Secondary | ICD-10-CM | POA: Diagnosis not present

## 2018-06-18 DIAGNOSIS — I214 Non-ST elevation (NSTEMI) myocardial infarction: Secondary | ICD-10-CM | POA: Diagnosis not present

## 2018-06-18 DIAGNOSIS — I4891 Unspecified atrial fibrillation: Secondary | ICD-10-CM | POA: Diagnosis not present

## 2018-06-18 DIAGNOSIS — I2510 Atherosclerotic heart disease of native coronary artery without angina pectoris: Secondary | ICD-10-CM | POA: Diagnosis not present

## 2018-06-18 DIAGNOSIS — Z48812 Encounter for surgical aftercare following surgery on the circulatory system: Secondary | ICD-10-CM | POA: Diagnosis not present

## 2018-06-18 DIAGNOSIS — E1122 Type 2 diabetes mellitus with diabetic chronic kidney disease: Secondary | ICD-10-CM | POA: Diagnosis not present

## 2018-06-18 DIAGNOSIS — I1310 Hypertensive heart and chronic kidney disease without heart failure, with stage 1 through stage 4 chronic kidney disease, or unspecified chronic kidney disease: Secondary | ICD-10-CM | POA: Diagnosis not present

## 2018-06-20 ENCOUNTER — Telehealth: Payer: Self-pay | Admitting: Physician Assistant

## 2018-06-20 NOTE — Telephone Encounter (Signed)
      [address]       [place] [address]             [phone]    [name]  [id number]      Today, I had the pleasure of speaking with Daniel Herrera  2-3 weeks status post  CABG x 3   Overall, the patient is doing well.  He did have an episode of chest pain that prompted the patient to report to the ED on 3/14.  He was admitted for observation.  Echocardiogram was obtained and showed normal EF and no evidence of pericardial effusion.  CXR was also obtained and I personally reviewed showed no evidence of pleural effusion, sternal wires were intact, on evidence of pneumonia or pleural effusion.  The patient states he is doing well and has had no further episodes of chest discomfort.    1. Incisions- healing well, no evidence infection.  Patient states have healed beautifully 2. Denies any episodes of fevers 3. Denies any further chest pain, no shortness of breath, palpitations, dizziness 4. Ambulation- several times per day.  He is ambulating without difficulty.   Patient is working with home PT.  Home health nursing is also monitoring the patient.    All questions were answered to the patient's satisfaction. The patient is aware that they may call our office if new concerns arise. Our phone number was provided to the patient for convenience.    Ellwood Handler, PA-C

## 2018-06-22 DIAGNOSIS — I4891 Unspecified atrial fibrillation: Secondary | ICD-10-CM | POA: Diagnosis not present

## 2018-06-22 DIAGNOSIS — E1122 Type 2 diabetes mellitus with diabetic chronic kidney disease: Secondary | ICD-10-CM | POA: Diagnosis not present

## 2018-06-22 DIAGNOSIS — I214 Non-ST elevation (NSTEMI) myocardial infarction: Secondary | ICD-10-CM | POA: Diagnosis not present

## 2018-06-22 DIAGNOSIS — I1310 Hypertensive heart and chronic kidney disease without heart failure, with stage 1 through stage 4 chronic kidney disease, or unspecified chronic kidney disease: Secondary | ICD-10-CM | POA: Diagnosis not present

## 2018-06-22 DIAGNOSIS — I2510 Atherosclerotic heart disease of native coronary artery without angina pectoris: Secondary | ICD-10-CM | POA: Diagnosis not present

## 2018-06-22 DIAGNOSIS — Z48812 Encounter for surgical aftercare following surgery on the circulatory system: Secondary | ICD-10-CM | POA: Diagnosis not present

## 2018-06-23 ENCOUNTER — Other Ambulatory Visit: Payer: Self-pay | Admitting: Physician Assistant

## 2018-06-24 ENCOUNTER — Ambulatory Visit: Payer: Medicare Other

## 2018-06-24 ENCOUNTER — Other Ambulatory Visit: Payer: Self-pay | Admitting: Cardiovascular Disease

## 2018-06-29 DIAGNOSIS — I214 Non-ST elevation (NSTEMI) myocardial infarction: Secondary | ICD-10-CM | POA: Diagnosis not present

## 2018-06-29 DIAGNOSIS — I4891 Unspecified atrial fibrillation: Secondary | ICD-10-CM | POA: Diagnosis not present

## 2018-06-29 DIAGNOSIS — E1122 Type 2 diabetes mellitus with diabetic chronic kidney disease: Secondary | ICD-10-CM | POA: Diagnosis not present

## 2018-06-29 DIAGNOSIS — I1310 Hypertensive heart and chronic kidney disease without heart failure, with stage 1 through stage 4 chronic kidney disease, or unspecified chronic kidney disease: Secondary | ICD-10-CM | POA: Diagnosis not present

## 2018-06-29 DIAGNOSIS — Z48812 Encounter for surgical aftercare following surgery on the circulatory system: Secondary | ICD-10-CM | POA: Diagnosis not present

## 2018-06-29 DIAGNOSIS — I2510 Atherosclerotic heart disease of native coronary artery without angina pectoris: Secondary | ICD-10-CM | POA: Diagnosis not present

## 2018-07-10 ENCOUNTER — Telehealth: Payer: Self-pay | Admitting: Physician Assistant

## 2018-07-10 NOTE — Telephone Encounter (Signed)
      [address]       [place] [address]             [phone]    WILLIA LAMPERT  [id number]      Today, I had the pleasure of speaking with Mr. Strozier who is 8 weeks status post CABG x 3 to see how the patient's post-operative period was going.   Overall, the patient is doing very well. He was working in his garden at the time of this call. He is pleased with his progress and had no problems to discuss.  He denies pain, palpitations,  or shortness of breath. His appetite is good and he is walking without limitations. His incisions are healing without signs of complication. He denies any lower extremity swelling. Medications were reviewed and no changes were made.  He could likely discontinue his amiodarone that was started post operatively for atrial fibrillation but he has not had cardiology follow up yet.  He is tolerating the amiodarone fine and I think it is prudent to continue it until he can be evaluated to assure he is maintaining SR.  Sternal precautions were reviewed. All questions answered.  He understands the reasons for delaying his office visit minimize risk of COVID-19 exposure. He also was assured that we were available to assist him should any problems arise before his next visit   Antony Odea, PA-C

## 2018-07-16 ENCOUNTER — Ambulatory Visit: Payer: Medicare Other

## 2018-07-17 ENCOUNTER — Telehealth: Payer: Self-pay | Admitting: *Deleted

## 2018-07-17 ENCOUNTER — Encounter: Payer: Self-pay | Admitting: *Deleted

## 2018-07-17 NOTE — Telephone Encounter (Signed)
   Cardiac Questionnaire:    Since your last visit or hospitalization:    1. Have you been having new or worsening chest pain? NO   2. Have you been having new or worsening shortness of breath? NO 3. Have you been having new or worsening leg swelling, wt gain, or increase in abdominal girth (pants fitting more tightly)? NO   4. Have you had any passing out spells? NO    *A YES to any of these questions would result in the appointment being kept. *If all the answers to these questions are NO, we should indicate that given the current situation regarding the worldwide coronarvirus pandemic, at the recommendation of the CDC, we are looking to limit gatherings in our waiting area, and thus will reschedule their appointment beyond four weeks from today.   _____________   COVID-19 Pre-Screening Questions:  . Do you currently have a fever? NO . Have you recently travelled on a cruise, internationally, or to [place], [place], [place], [place], [place], or [place], [place] ([employer])? NO . Have you been in contact with someone that is currently pending confirmation of Covid19 testing or has been confirmed to have the Covid19 virus?  NO Are you currently experiencing fatigue or cough?NO          

## 2018-07-18 ENCOUNTER — Telehealth: Payer: Self-pay | Admitting: Cardiovascular Disease

## 2018-07-18 NOTE — Telephone Encounter (Signed)
Smartphone/no computer/consent obtained/pre reg complete/dc/04.14.2020

## 2018-07-19 ENCOUNTER — Encounter: Payer: Self-pay | Admitting: Cardiovascular Disease

## 2018-07-19 ENCOUNTER — Telehealth (INDEPENDENT_AMBULATORY_CARE_PROVIDER_SITE_OTHER): Payer: Medicare Other | Admitting: Cardiovascular Disease

## 2018-07-19 ENCOUNTER — Telehealth: Payer: Self-pay

## 2018-07-19 DIAGNOSIS — E1169 Type 2 diabetes mellitus with other specified complication: Secondary | ICD-10-CM | POA: Diagnosis not present

## 2018-07-19 DIAGNOSIS — Z6827 Body mass index (BMI) 27.0-27.9, adult: Secondary | ICD-10-CM | POA: Diagnosis not present

## 2018-07-19 DIAGNOSIS — E785 Hyperlipidemia, unspecified: Secondary | ICD-10-CM

## 2018-07-19 DIAGNOSIS — I2510 Atherosclerotic heart disease of native coronary artery without angina pectoris: Secondary | ICD-10-CM

## 2018-07-19 DIAGNOSIS — C61 Malignant neoplasm of prostate: Secondary | ICD-10-CM | POA: Diagnosis not present

## 2018-07-19 DIAGNOSIS — Z951 Presence of aortocoronary bypass graft: Secondary | ICD-10-CM

## 2018-07-19 DIAGNOSIS — Z9861 Coronary angioplasty status: Secondary | ICD-10-CM | POA: Diagnosis not present

## 2018-07-19 DIAGNOSIS — I252 Old myocardial infarction: Secondary | ICD-10-CM | POA: Diagnosis not present

## 2018-07-19 DIAGNOSIS — I480 Paroxysmal atrial fibrillation: Secondary | ICD-10-CM | POA: Diagnosis not present

## 2018-07-19 DIAGNOSIS — I10 Essential (primary) hypertension: Secondary | ICD-10-CM | POA: Diagnosis not present

## 2018-07-19 DIAGNOSIS — Z9181 History of falling: Secondary | ICD-10-CM | POA: Diagnosis not present

## 2018-07-19 NOTE — Patient Instructions (Signed)
Medication Instructions:  Your physician recommends that you continue on your current medications as directed. Please refer to the Current Medication list given to you today.  If you need a refill on your cardiac medications before your next appointment, please call your pharmacy.   Lab work: NONE If you have labs (blood work) drawn today and your tests are completely normal, you will receive your results only by: [name] MyChart Message (if you have MyChart) OR . A paper copy in the mail If you have any lab test that is abnormal or we need to change your treatment, we will call you to review the results.  Testing/Procedures: NONE  Follow-Up: At [facility], you and your health needs are our priority.  As part of our continuing mission to provide you with exceptional heart care, we have created designated Provider Care Teams.  These Care Teams include your primary Cardiologist (physician) and Advanced Practice Providers (APPs -  Physician Assistants and Nurse Practitioners) who all work together to provide you with the care you need, when you need it. You will need a follow up appointment in 3 months with Dr. Gwenlyn Found.

## 2018-07-19 NOTE — Progress Notes (Signed)
Virtual Visit via Telephone Note   This visit type was conducted due to national recommendations for restrictions regarding the COVID-19 Pandemic (e.g. social distancing) in an effort to limit this patient's exposure and mitigate transmission in our community.  Due to his co-morbid illnesses, this patient is at least at moderate risk for complications without adequate follow up.  This format is felt to be most appropriate for this patient at this time.  The patient did not have access to video technology/had technical difficulties with video requiring transitioning to audio format only (telephone).  All issues noted in this document were discussed and addressed.  No physical exam could be performed with this format.  Please refer to the patient's chart for his  consent to telehealth for [facility]   Evaluation Performed:  Follow-up visit  Date:  07/19/2018   ID:  Daniel Herrera, Daniel Herrera 07-Feb-1941, MRN [id number]  Patient Location: Home Provider Location: Office  PCP:  [name], NP  Cardiologist:  Quay Burow, MD  Electrophysiologist:  None   Chief Complaint: Chest pain  History of Present Illness:    Daniel Herrera is a 78 y.o.   married Caucasian male father of 3 children, grandfather of 6 grandchildren without prior heart diseasewho I last saw in the office  02/14/2017. He doeshave prostate cancerand has undergone radiation therapy followed by Dr. Alinda Money.[name] He's had been having chest pain for a month, worse the day of presentation 02/22/15. He presented to the emergency room where his EKG showed anterolateral T-wave inversion. His troponin level was 0.7. He was given sublingual nitroglycerin with some improvement in his symptoms but because of ongoing chest pain he was decided to bring him to the Cath Lab for urgent angiography and potential intervention. Problems include history of hypertension and hyperlipidemia. I brought him to the cath lab revealing high-grade proximal  and mid LAD disease which I stented using 2 drug-eluting stents. He did have moderate first obtuse marginal branch and mid to distal AV groove circumflex disease with an EF of 45-50%. Myoview stress test performed 04/14/15 was nonischemic. He has complained of palpitations and a event monitor showed only PVCs.   He had an admission for unstable angina back in February and underwent cardiac catheterization by Dr. Irish Lack revealing three-vessel disease.  2 days later he underwent CABG x3 by Dr. Cyndia Bent with a LIMA to his LAD, left radial to an OM branch and a RIMA to the RCA.  His EF was normal.  He had some PAF on postop day 1 and was treated with amiodarone.  His postop course was otherwise fairly unremarkable.  He was recently seen in the ER 06/15/2018 for atypical chest pain and we were consulted.  His enzymes were negative and he was discharged home the following day.  He says that he feels the best he is felt in years since his bypass operation.  He currently denies chest pain or shortness of breath.  The patient does not have symptoms concerning for COVID-19 infection (fever, chills, cough, or new shortness of breath).    Past Medical History:  Diagnosis Date  . Angio-edema   . Anxiety   . Arthritis   . CAD (coronary artery disease)    a. 02/2015: DES to mid-LAD  . GERD (gastroesophageal reflux disease)   . Gout   . High cholesterol   . History of prolonged Q-T interval on ECG   . Hx of Clostridium difficile infection   . Hx of umbilical  hernia repair   . Personal history of digestive disease    gastric ulcer  . Prostate cancer (Diagonal)   . Prostate enlargement   . Skin cancer    basal cell carcinoma  . Thyroid disease   . Urticaria    Past Surgical History:  Procedure Laterality Date  . APPENDECTOMY    . BACK SURGERY    . CARDIAC CATHETERIZATION N/A 02/21/2015   Procedure: Left Heart Cath;  Surgeon: Lorretta Harp, MD;  Location: [place] [facility];  Service: Cardiovascular;   Laterality: N/A;  . CATARACT EXTRACTION, BILATERAL    . CORONARY ARTERY BYPASS GRAFT N/A 05/24/2018   Procedure: CORONARY ARTERY BYPASS GRAFTING (CABG), ON PUMP, TIMES THREE, USING BILATERAL INTERNAL MAMMARY ARTERY AND HARVESTED LEFT RADIAL ARTERY;  Surgeon: Gaye Pollack, MD;  Location: [place];  Service: Open Heart Surgery;  Laterality: N/A;  . LEFT HEART CATH AND CORONARY ANGIOGRAPHY N/A 05/22/2018   Procedure: LEFT HEART CATH AND CORONARY ANGIOGRAPHY;  Surgeon: Jettie Booze, MD;  Location: [place] [facility];  Service: Cardiovascular;  Laterality: N/A;  . NASAL SINUS SURGERY    . PROSTATE BIOPSY    . RADIAL ARTERY HARVEST Left 05/24/2018   Procedure: RADIAL ARTERY HARVEST;  Surgeon: Gaye Pollack, MD;  Location: [place];  Service: Open Heart Surgery;  Laterality: Left;  . right shoulder rotator cuff repair    . TEE WITHOUT CARDIOVERSION N/A 05/24/2018   Procedure: TRANSESOPHAGEAL ECHOCARDIOGRAM (TEE);  Surgeon: Gaye Pollack, MD;  Location: [place];  Service: Open Heart Surgery;  Laterality: N/A;     Current Meds  Medication Sig  . amiodarone (PACERONE) 200 MG tablet Take 1 tablet (200 mg total) by mouth daily.  [name] aspirin EC 81 MG tablet Take 1 tablet (81 mg total) by mouth daily.  [name] azelastine (ASTELIN) 0.1 % nasal spray Place 1 spray into both nostrils 2 (two) times daily.  [name] EPINEPHrine 0.3 mg/0.3 mL IJ SOAJ injection Inject 0.3 mLs (0.3 mg total) into the muscle as needed (for allergic reaction).  . fluticasone (FLONASE) 50 MCG/ACT nasal spray Place 1 spray into both nostrils daily.  . isosorbide mononitrate (IMDUR) 30 MG 24 hr tablet TAKE 1 TABLET(30 MG) BY MOUTH DAILY  . ketoconazole (NIZORAL) 2 % shampoo Apply 1 application topically 2 (two) times daily.  [name] levothyroxine (SYNTHROID, LEVOTHROID) 75 MCG tablet Take 75 mcg by mouth daily before breakfast.  . metoprolol tartrate (LOPRESSOR) 25 MG tablet Take 1 tablet (25 mg total) by mouth 2 (two) times daily.  . montelukast  (SINGULAIR) 10 MG tablet Take 1 tablet (10 mg total) by mouth at bedtime.  . nitroGLYCERIN (NITROSTAT) 0.4 MG SL tablet DISSOLVE 1 TABLET UNDER TONGUE EVERY 5 MINUTES FOR 3 DOSES AS NEEDED CHEST PAIN. IF NO RELIEF CALL 911 (Patient taking differently: Place 0.4 mg under the tongue every 5 (five) minutes as needed for chest pain. )  . OVER THE COUNTER MEDICATION Take 1-2 capsules by mouth See admin instructions. Health Plus - Super Colon Cleanse 1 capsule in the morning and 2 capsules at night as needed for bowel movement  . pantoprazole (PROTONIX) 40 MG tablet Take 1 tablet (40 mg total) by mouth 2 (two) times daily.  . Pitavastatin Calcium (LIVALO) 4 MG TABS Take 4 mg by mouth at bedtime.   Vladimir Faster Glycol-Propyl Glycol (SYSTANE OP) Place 1 drop into both eyes 2 (two) times daily.  [name] triamcinolone cream (KENALOG) 0.5 % Apply 1 application topically  daily.     Allergies:   Lisinopril; Benadryl [diphenhydramine hcl]; Darvocet [propoxyphene n-acetaminophen]; Darvon [propoxyphene hcl]; and Tylenol [acetaminophen]   Social History   Tobacco Use  . Smoking status: Never Smoker  . Smokeless tobacco: Never Used  Substance Use Topics  . Alcohol use: No  . Drug use: No     Family Hx: The patient's family history includes Cancer in his father, mother, and sister.  ROS:   Please see the history of present illness.     All other systems reviewed and are negative.   Prior CV studies:   The following studies were reviewed today:  None  Labs/Other Tests and Data Reviewed:    EKG:  An ECG dated 07/19/2018 was personally reviewed today and demonstrated:  Sinus bradycardia at 59 without ST or T wave changes.  I personally reviewed this EKG  Recent Labs: 12/21/2017: ALT 24 05/21/2018: TSH 3.205 05/25/2018: Magnesium 2.5 06/15/2018: BUN 23; Creatinine, Ser 1.53; Hemoglobin 11.2; Platelets 273; Potassium 4.0; Sodium 136   Recent Lipid Panel Lab Results  Component Value Date/Time   CHOL 118  05/24/2015 07:25 AM   TRIG 324 (H) 07/31/2017 03:29 PM   HDL 32 (L) 05/24/2015 07:25 AM   CHOLHDL 3.7 05/24/2015 07:25 AM   LDLCALC 48 05/24/2015 07:25 AM    Wt Readings from Last 3 Encounters:  07/19/18 193 lb (87.5 kg)  06/16/18 190 lb (86.2 kg)  06/13/18 192 lb 3.2 oz (87.2 kg)     Objective:    Vital Signs:  BP 131/68   Pulse 66   Ht 6\' 1"  (1.854 m)   Wt 193 lb (87.5 kg)   BMI 25.46 kg/m    VITAL SIGNS:  reviewed   A full physical exam was not performed since this was a virtual telemedicine telephone visit.  ASSESSMENT & PLAN:    1. Coronary artery disease- history of CAD status post non-STEMI 02/22/2015 at which time I performed stenting of the high-grade proximal and mid LAD stenoses with 2 drug-eluting stents.  He did have moderate first first obtuse marginal branch stenosis as well as distal AV groove circumflex stenosis with an EF of 45 to 50% at that time.  Myoview performed 04/14/2015 was nonischemic.  He was admitted with chest pain and non-STEMI in February and underwent catheterization by Dr. Irish Lack on 05/22/2018 revealing three-vessel disease.  2 days later he underwent CABG x3 by Dr. Cyndia Bent with a LIMA to the LAD, RIMA to the RCA and and a left radial to an obtuse marginal branch.  He has recuperated nicely. 2. Paroxysmal atrial fibrillation-he developed PAF on postop day 1 and was placed on amiodarone.  He is currently in sinus rhythm.  I told him to discontinue his amiodarone. 3. Essential hypertension- history of essential hypertension with blood pressure measured today by the patient at home of 130/68 with a pulse of 68.  He is on Lopressor. 4. Hyperlipidemia- history of hyperlipidemia on Livalo followed by his PCP  COVID-19 Education: The signs and symptoms of COVID-19 were discussed with the patient and how to seek care for testing (follow up with PCP or arrange E-visit).  The importance of social distancing was discussed today.  Time:   Today, I have spent  11 minutes with the patient with telehealth technology discussing the above problems.     Medication Adjustments/Labs and Tests Ordered: Current medicines are reviewed at length with the patient today.  Concerns regarding medicines are outlined above.   Tests Ordered: No  orders of the defined types were placed in this encounter.   Medication Changes: No orders of the defined types were placed in this encounter.   Disposition:  Follow up in 3 month(s)  Signed, Quay Burow, MD  07/19/2018 3:13 PM    [place]

## 2018-07-19 NOTE — Telephone Encounter (Signed)
Patient and/or DPR-approved person aware of AVS instructions and verbalized understanding. AVS MAILED TO PT ADDRESS ON FILE

## 2018-07-19 NOTE — Telephone Encounter (Signed)
Virtual Visit Pre-Appointment Phone Call  Steps For Call:  1. Confirm consent - "In the setting of the current Covid19 crisis, you are scheduled for a (phone or video) visit with your provider on (date) at (time).  Just as we do with many in-office visits, in order for you to participate in this visit, we must obtain consent.  If you'd like, I can send this to your mychart (if signed up) or email for you to review.  Otherwise, I can obtain your verbal consent now.  All virtual visits are billed to your insurance company just like a normal visit would be.  By agreeing to a virtual visit, we'd like you to understand that the technology does not allow for your provider to perform an examination, and thus may limit your provider's ability to fully assess your condition. If your provider identifies any concerns that need to be evaluated in person, we will make arrangements to do so.  Finally, though the technology is pretty good, we cannot assure that it will always work on either your or our end, and in the setting of a video visit, we may have to convert it to a phone-only visit.  In either situation, we cannot ensure that we have a secure connection.  Are you willing to proceed?" STAFF: Did the patient verbally acknowledge consent to telehealth visit? Document YES/NO here: YES  2. Confirm the BEST phone number to call the day of the visit by including in appointment notes  3. Give patient instructions for WebEx/MyChart download to smartphone as below or Doximity/Doxy.me if video visit (depending on what platform provider is using)  4. Advise patient to be prepared with their blood pressure, heart rate, weight, any heart rhythm information, their current medicines, and a piece of paper and pen handy for any instructions they may receive the day of their visit  5. Inform patient they will receive a phone call 15 minutes prior to their appointment time (may be from unknown caller ID) so they should be  prepared to answer  6. Confirm that appointment type is correct in Epic appointment notes (VIDEO vs PHONE)     TELEPHONE CALL NOTE  Daniel Herrera has been deemed a candidate for a follow-up tele-health visit to limit community exposure during the Covid-19 pandemic. I spoke with the patient via phone to ensure availability of phone/video source, confirm preferred email & phone number, and discuss instructions and expectations.  I reminded Daniel Herrera to be prepared with any vital sign and/or heart rhythm information that could potentially be obtained via home monitoring, at the time of his visit. I reminded Daniel Herrera to expect a phone call at the time of his visit if his visit.  Calaya Gildner Dawna Part, RN 07/19/2018 2:47 PM   INSTRUCTIONS FOR DOWNLOADING THE [place] APP TO SMARTPHONE  - If Apple, ask patient to go to [employer] and type in WebEx in the search bar. [employer], the blue/green circle. If Android, go to [employer] and type in [employer] in the search bar. The app is free but as with any other app downloads, their phone may require them to verify saved payment information or Apple/Android password.  - The patient does NOT have to create an account. - On the day of the visit, the assist will walk the patient through joining the meeting with the meeting number/password.  INSTRUCTIONS FOR DOWNLOADING THE MYCHART APP TO SMARTPHONE  - The patient must first  make sure to have activated MyChart and know their login information - If Apple, go to [employer] and type in MyChart in the search bar and download the app. If Android, ask patient to go to [employer] and type in [place] in the search bar and download the app. The app is free but as with any other app downloads, their phone may require them to verify saved payment information or Apple/Android password.  - The patient will need to then log into the app with their MyChart username and password,  and select [facility] as their healthcare provider to link the account. When it is time for your visit, go to the MyChart app, find appointments, and click Begin Video Visit. Be sure to Select Allow for your device to access the Microphone and Camera for your visit. You will then be connected, and your provider will be with you shortly.  **If they have any issues connecting, or need assistance please contact MyChart service desk [phone])**  **If using a computer, in order to ensure the best quality for their visit they will need to use either of the following Internet Browsers: [employer], or Google Chrome**  IF USING DOXIMITY or DOXY.ME - The patient will receive a link just prior to their visit, either by text or email (to be determined day of appointment depending on if it's doxy.me or Doximity).     FULL LENGTH CONSENT FOR TELE-HEALTH VISIT   I hereby voluntarily request, consent and authorize [facility] and its employed or contracted physicians, physician assistants, nurse practitioners or other licensed health care professionals (the Practitioner), to provide me with telemedicine health care services (the "Services") as deemed necessary by the treating Practitioner. I acknowledge and consent to receive the Services by the Practitioner via telemedicine. I understand that the telemedicine visit will involve communicating with the Practitioner through live audiovisual communication technology and the disclosure of certain medical information by electronic transmission. I acknowledge that I have been given the opportunity to request an in-person assessment or other available alternative prior to the telemedicine visit and am voluntarily participating in the telemedicine visit.  I understand that I have the right to withhold or withdraw my consent to the use of telemedicine in the course of my care at any time, without affecting my right to future care or treatment, and  that the Practitioner or I may terminate the telemedicine visit at any time. I understand that I have the right to inspect all information obtained and/or recorded in the course of the telemedicine visit and may receive copies of available information for a reasonable fee.  I understand that some of the potential risks of receiving the Services via telemedicine include:  [name] Delay or interruption in medical evaluation due to technological equipment failure or disruption; . Information transmitted may not be sufficient (e.g. poor resolution of images) to allow for appropriate medical decision making by the Practitioner; and/or  . In rare instances, security protocols could fail, causing a breach of personal health information.  Furthermore, I acknowledge that it is my responsibility to provide information about my medical history, conditions and care that is complete and accurate to the best of my ability. I acknowledge that Practitioner's advice, recommendations, and/or decision may be based on factors not within their control, such as incomplete or inaccurate data provided by me or distortions of diagnostic images or specimens that may result from electronic transmissions. I understand that the practice of medicine is not an  exact science and that Practitioner makes no warranties or guarantees regarding treatment outcomes. I acknowledge that I will receive a copy of this consent concurrently upon execution via email to the email address I last provided but may also request a printed copy by calling the office of [facility].    I understand that my insurance will be billed for this visit.   I have read or had this consent read to me. . I understand the contents of this consent, which adequately explains the benefits and risks of the Services being provided via telemedicine.  . I have been provided ample opportunity to ask questions regarding this consent and the Services and have had my questions  answered to my satisfaction. . I give my informed consent for the services to be provided through the use of telemedicine in my medical care  By participating in this telemedicine visit I agree to the above.

## 2018-08-09 DIAGNOSIS — E538 Deficiency of other specified B group vitamins: Secondary | ICD-10-CM | POA: Diagnosis not present

## 2018-08-09 DIAGNOSIS — K1379 Other lesions of oral mucosa: Secondary | ICD-10-CM | POA: Diagnosis not present

## 2018-08-09 DIAGNOSIS — Z6827 Body mass index (BMI) 27.0-27.9, adult: Secondary | ICD-10-CM | POA: Diagnosis not present

## 2018-09-10 ENCOUNTER — Other Ambulatory Visit: Payer: Self-pay

## 2018-09-10 ENCOUNTER — Other Ambulatory Visit: Payer: Self-pay | Admitting: Surgery

## 2018-09-10 ENCOUNTER — Encounter: Payer: Self-pay | Admitting: Allergy and Immunology

## 2018-09-10 ENCOUNTER — Ambulatory Visit (INDEPENDENT_AMBULATORY_CARE_PROVIDER_SITE_OTHER): Payer: Medicare Other | Admitting: Allergy and Immunology

## 2018-09-10 VITALS — BP 130/80 | HR 64 | Resp 16

## 2018-09-10 DIAGNOSIS — I2510 Atherosclerotic heart disease of native coronary artery without angina pectoris: Secondary | ICD-10-CM | POA: Diagnosis not present

## 2018-09-10 DIAGNOSIS — D841 Defects in the complement system: Secondary | ICD-10-CM | POA: Diagnosis not present

## 2018-09-10 DIAGNOSIS — J849 Interstitial pulmonary disease, unspecified: Secondary | ICD-10-CM | POA: Diagnosis not present

## 2018-09-10 DIAGNOSIS — Z9861 Coronary angioplasty status: Secondary | ICD-10-CM

## 2018-09-10 DIAGNOSIS — J3089 Other allergic rhinitis: Secondary | ICD-10-CM | POA: Diagnosis not present

## 2018-09-10 DIAGNOSIS — K219 Gastro-esophageal reflux disease without esophagitis: Secondary | ICD-10-CM | POA: Diagnosis not present

## 2018-09-10 DIAGNOSIS — Z951 Presence of aortocoronary bypass graft: Secondary | ICD-10-CM

## 2018-09-10 MED ORDER — PANTOPRAZOLE SODIUM 40 MG PO TBEC: 40.0000 mg | DELAYED_RELEASE_TABLET | Freq: Two times a day (BID) | ORAL | 0 refills | Status: DC

## 2018-09-10 MED ORDER — MONTELUKAST SODIUM 10 MG PO TABS: 10.0000 mg | ORAL_TABLET | Freq: Every day | ORAL | 5 refills | Status: DC

## 2018-09-10 MED ORDER — AZELASTINE HCL 0.1 % NA SOLN: 1.0000 | Freq: Two times a day (BID) | NASAL | 5 refills | Status: DC

## 2018-09-10 MED ORDER — PANTOPRAZOLE SODIUM 40 MG PO TBEC: 40.0000 mg | DELAYED_RELEASE_TABLET | Freq: Two times a day (BID) | ORAL | 5 refills | Status: DC

## 2018-09-10 MED ORDER — FLUTICASONE PROPIONATE 50 MCG/ACT NA SUSP: 1.0000 | Freq: Every day | NASAL | 4 refills | Status: DC

## 2018-09-10 NOTE — Progress Notes (Signed)
[facility] [place] [facility] [place]   Follow-up Note  Referring Provider: [name], NP  Primary Provider: [name], NP Date of Office Visit: 09/10/2018  Subjective:   Daniel Herrera (DOB: 1940/08/26) is a 78 y.o. male who returns to the [place] on 09/10/2018 in re-evaluation of the following:  HPI: Daniel Herrera returns to this clinic in reevaluation of his type II hereditary angioedema and history of allergic rhinitis and LPR and a history of interstitial lung disease.  His last visit to this clinic was 12 March 2018 at which point in time he was doing quite well while using Takhzryo to treat his swelling disorder.  He required coronary artery bypass surgery 24 May 2018 and apparently around that point in time he required 2 injections of Firazyr utilized in a preventative manner prior to surgical instrumentation during his hospitalization.  He had to discontinue his Asencion Islam secondary to insurance not covering this medication and we switched him over to [facility] in February 2020 but unfortunately he has not started this medication.  Fortunately he has not had any swelling episodes since his surgery.  He believes that his nose is doing relatively well at this point in time.  He believes that his reflux is doing well at this point in time.  His interstitial lung disease is being followed by [facility] health pulmonology.  Allergies as of 09/10/2018      Reactions   Lisinopril Swelling   Facial and lip swelling. And throat swelling.    Benadryl [diphenhydramine Hcl] Swelling   Facial swelling from high dose (tolerates Advil PM as needed)   Darvocet [propoxyphene N-acetaminophen] Other (See Comments)   hallucination   Darvon [propoxyphene Hcl] Other (See Comments)   Hallucination   Tylenol [acetaminophen] Swelling   Foot swelling      Medication List    amiodarone 200 MG tablet Commonly known as:  PACERONE Take 1 tablet (200 mg  total) by mouth daily.   aspirin EC 81 MG tablet Take 1 tablet (81 mg total) by mouth daily.   azelastine 0.1 % nasal spray Commonly known as:  ASTELIN Place 1 spray into both nostrils 2 (two) times daily.   EPINEPHrine 0.3 mg/0.3 mL Soaj injection Commonly known as:  EPI-PEN Inject 0.3 mLs (0.3 mg total) into the muscle as needed (for allergic reaction).   fluticasone 50 MCG/ACT nasal spray Commonly known as:  FLONASE Place 1 spray into both nostrils daily.   isosorbide mononitrate 30 MG 24 hr tablet Commonly known as:  IMDUR TAKE 1 TABLET(30 MG) BY MOUTH DAILY   ketoconazole 2 % shampoo Commonly known as:  NIZORAL Apply 1 application topically 2 (two) times daily.   levothyroxine 75 MCG tablet Commonly known as:  SYNTHROID Take 75 mcg by mouth daily before breakfast.   Livalo 4 MG Tabs Generic drug:  Pitavastatin Calcium Take 4 mg by mouth at bedtime.   metoprolol tartrate 25 MG tablet Commonly known as:  LOPRESSOR Take 1 tablet (25 mg total) by mouth 2 (two) times daily.   montelukast 10 MG tablet Commonly known as:  SINGULAIR Take 1 tablet (10 mg total) by mouth at bedtime.   nitroGLYCERIN 0.4 MG SL tablet Commonly known as:  NITROSTAT DISSOLVE 1 TABLET UNDER TONGUE EVERY 5 MINUTES FOR 3 DOSES AS NEEDED CHEST PAIN. IF NO RELIEF CALL 911 What changed:  See the new instructions.   OVER THE COUNTER MEDICATION Take 1-2 capsules by mouth  See admin instructions. Health Plus - Super Colon Cleanse 1 capsule in the morning and 2 capsules at night as needed for bowel movement   pantoprazole 40 MG tablet Commonly known as:  PROTONIX Take 1 tablet (40 mg total) by mouth 2 (two) times daily.   triamcinolone cream 0.5 % Commonly known as:  KENALOG Apply 1 application topically daily.   Vitamin B-12 5000 MCG Tbdp Take 1,000 mcg by mouth daily.       Past Medical History:  Diagnosis Date  . Angio-edema   . Anxiety   . Arthritis   . CAD (coronary artery  disease)    a. 02/2015: DES to mid-LAD  . GERD (gastroesophageal reflux disease)   . Gout   . High cholesterol   . History of prolonged Q-T interval on ECG   . Hx of Clostridium difficile infection   . Hx of umbilical hernia repair   . Personal history of digestive disease    gastric ulcer  . Prostate cancer ([place])   . Prostate enlargement   . Skin cancer    basal cell carcinoma  . Thyroid disease   . Urticaria     Past Surgical History:  Procedure Laterality Date  . APPENDECTOMY    . BACK SURGERY    . CARDIAC CATHETERIZATION N/A 02/21/2015   Procedure: Left Heart Cath;  Surgeon: Lorretta Harp, MD;  Location: [facility];  Service: Cardiovascular;  Laterality: N/A;  . CATARACT EXTRACTION, BILATERAL    . CORONARY ARTERY BYPASS GRAFT N/A 05/24/2018   Procedure: CORONARY ARTERY BYPASS GRAFTING (CABG), ON PUMP, TIMES THREE, USING BILATERAL INTERNAL MAMMARY ARTERY AND HARVESTED LEFT RADIAL ARTERY;  Surgeon: Gaye Pollack, MD;  Location: [facility] [facility];  Service: Open Heart Surgery;  Laterality: N/A;  . LEFT HEART CATH AND CORONARY ANGIOGRAPHY N/A 05/22/2018   Procedure: LEFT HEART CATH AND CORONARY ANGIOGRAPHY;  Surgeon: Jettie Booze, MD;  Location: [place] [facility];  Service: Cardiovascular;  Laterality: N/A;  . NASAL SINUS SURGERY    . PROSTATE BIOPSY    . RADIAL ARTERY HARVEST Left 05/24/2018   Procedure: RADIAL ARTERY HARVEST;  Surgeon: Gaye Pollack, MD;  Location: [facility];  Service: Open Heart Surgery;  Laterality: Left;  . right shoulder rotator cuff repair    . TEE WITHOUT CARDIOVERSION N/A 05/24/2018   Procedure: TRANSESOPHAGEAL ECHOCARDIOGRAM (TEE);  Surgeon: Gaye Pollack, MD;  Location: [facility];  Service: Open Heart Surgery;  Laterality: N/A;    Review of systems negative except as noted in HPI / PMHx or noted below:  Review of Systems  Constitutional: Negative.   HENT: Negative.   Eyes: Negative.   Respiratory: Negative.   Cardiovascular: Negative.    Gastrointestinal: Negative.   Genitourinary: Negative.   Musculoskeletal: Negative.   Skin: Negative.   Neurological: Negative.   Endo/Heme/Allergies: Negative.   Psychiatric/Behavioral: Negative.      Objective:   Vitals:   09/10/18 1043  BP: 130/80  Pulse: 64  Resp: 16  SpO2: 94%          Physical Exam Constitutional:      Appearance: He is not diaphoretic.  HENT:     Head: Normocephalic.     Right Ear: External ear normal.     Left Ear: External ear normal.     Ears:     Comments: Hearing aid    Nose: Nose normal. No mucosal edema or rhinorrhea.     Mouth/Throat:  Pharynx: Uvula midline. No oropharyngeal exudate.  Eyes:     Conjunctiva/sclera: Conjunctivae normal.  Neck:     Thyroid: No thyromegaly.     Trachea: Trachea normal. No tracheal tenderness or tracheal deviation.  Cardiovascular:     Rate and Rhythm: Normal rate and regular rhythm.     Heart sounds: Normal heart sounds, S1 normal and S2 normal. No murmur.  Pulmonary:     Effort: No respiratory distress.     Breath sounds: Normal breath sounds. No stridor. No wheezing or rales.  Lymphadenopathy:     Head:     Right side of head: No tonsillar adenopathy.     Left side of head: No tonsillar adenopathy.     Cervical: No cervical adenopathy.  Skin:    Findings: No erythema or rash.     Nails: There is no clubbing.   Neurological:     Mental Status: He is alert.     Diagnostics:   Assessment and Plan:   1. Hereditary angioedema type 2 ([place])   2. Perennial allergic rhinitis   3. LPRD (laryngopharyngeal reflux disease)   4. ILD (interstitial lung disease) ([place])     1.  Haegarda injections 2-3 times a week.  First dose in clinic today  2.  Continue to Treat and prevent inflammation:    A.  Flonase - 1 spray each nostril twice a day  B.  Azelastine - 1 spray each nostril twice a day  3.  Continue to Treat and prevent reflux:   A.  eliminate all caffeine consumption  B.   pantoprazole 40 mg twice a day  C.  famotidine 40 mg in evening  D.  OTC Mylanta  4.  If needed:   A.  Nasal saline spray  B.  OTC antihistamine - loratadine 10 mg 1 time per day  C.  OTC Mucinex DM - 2 tablets twice a day  D.  Firazyr injection for swelling reaction  5. Return to clinic in 6 months or earlier if problem  6. Obtain fall flu vaccine  We need to have [name] utilize Haegarda on a preventative basis and he can certainly use Firazyr should he develop a acute swelling episode.  I would like for him to continue to use anti-inflammatory agents for his upper airway and therapy directed against reflux as noted above.  I will see him back in this clinic in 6 months or earlier if there is a problem.  Allena Katz, MD Allergy / Immunology [place]

## 2018-09-10 NOTE — Patient Instructions (Addendum)
  1.  Haegarda injections 2-3 times a week.  First dose in clinic today  2.  Continue to Treat and prevent inflammation:    A.  Flonase - 1 spray each nostril twice a day  B.  Azelastine - 1 spray each nostril twice a day  3.  Continue to Treat and prevent reflux:   A.  eliminate all caffeine consumption  B.  pantoprazole 40 mg twice a day  C.  famotidine 40 mg in evening  D.  OTC Mylanta  4.  If needed:   A.  Nasal saline spray  B.  OTC antihistamine - loratadine 10 mg 1 time per day  C.  OTC Mucinex DM - 2 tablets twice a day  D.  Firazyr injection for swelling reaction  5. Return to clinic in 6 months or earlier if problem  6. Obtain fall flu vaccine

## 2018-09-11 ENCOUNTER — Encounter: Payer: Self-pay | Admitting: Surgery

## 2018-09-11 ENCOUNTER — Encounter: Payer: Self-pay | Admitting: Allergy and Immunology

## 2018-09-11 ENCOUNTER — Ambulatory Visit
Admission: RE | Admit: 2018-09-11 | Discharge: 2018-09-11 | Disposition: A | Discharge status: Home / Self Care | Payer: Medicare Other | Source: Ambulatory Visit | Attending: Surgery | Admitting: Surgery

## 2018-09-11 ENCOUNTER — Ambulatory Visit (INDEPENDENT_AMBULATORY_CARE_PROVIDER_SITE_OTHER): Payer: Self-pay | Admitting: Surgery

## 2018-09-11 VITALS — BP 160/90 | HR 73 | Temp 97.5°F | Resp 20 | Ht 73.0 in | Wt 201.0 lb

## 2018-09-11 DIAGNOSIS — Z951 Presence of aortocoronary bypass graft: Secondary | ICD-10-CM

## 2018-09-11 DIAGNOSIS — I2510 Atherosclerotic heart disease of native coronary artery without angina pectoris: Secondary | ICD-10-CM

## 2018-09-11 NOTE — Progress Notes (Signed)
HPI: Patient returns for routine postoperative follow-up having undergone coronary bypass graft surgery x3 on 05/24/2018. The patient's early postoperative recovery while in the hospital was notable for development of postoperative atrial fibrillation converted with amiodarone.  He had no recurrence.. Since hospital discharge the patient reports that he has been feeling well.  He is working on his farm and ambulating without any chest pain or shortness of breath.  He said that he felt poorly for about 3 years prior to his heart surgery but feels dramatically better now.  He had a telephone visit with Dr. Gwenlyn Found on 07/19/2018 and his amiodarone was discontinued at that time.  He has been checking his pulse with his pulse oximeter and it has been perfectly regular in the 70s.   Current Outpatient Medications  Medication Sig Dispense Refill  . amiodarone (PACERONE) 200 MG tablet Take 1 tablet (200 mg total) by mouth daily. 30 tablet 1  . aspirin EC 81 MG tablet Take 1 tablet (81 mg total) by mouth daily. 90 tablet 3  . azelastine (ASTELIN) 0.1 % nasal spray Place 1 spray into both nostrils 2 (two) times daily. 30 mL 5  . C1 Esterase Inhibitor, Human, (HAEGARDA) 2000 units SOLR Inject into the skin.    . Cyanocobalamin (VITAMIN B-12) 5000 MCG TBDP Take 1,000 mcg by mouth daily.    [name] EPINEPHrine 0.3 mg/0.3 mL IJ SOAJ injection Inject 0.3 mLs (0.3 mg total) into the muscle as needed (for allergic reaction). 2 Device 0  . fluticasone (FLONASE) 50 MCG/ACT nasal spray Place 1 spray into both nostrils daily. 16 g 4  . isosorbide mononitrate (IMDUR) 30 MG 24 hr tablet TAKE 1 TABLET(30 MG) BY MOUTH DAILY 90 tablet 0  . ketoconazole (NIZORAL) 2 % shampoo Apply 1 application topically 2 (two) times daily.    [name] levothyroxine (SYNTHROID, LEVOTHROID) 75 MCG tablet Take 75 mcg by mouth daily before breakfast.    . metoprolol tartrate (LOPRESSOR) 25 MG tablet Take 1 tablet (25 mg total) by mouth 2 (two) times  daily. 90 tablet 6  . montelukast (SINGULAIR) 10 MG tablet Take 1 tablet (10 mg total) by mouth at bedtime. 30 tablet 5  . nitroGLYCERIN (NITROSTAT) 0.4 MG SL tablet DISSOLVE 1 TABLET UNDER TONGUE EVERY 5 MINUTES FOR 3 DOSES AS NEEDED CHEST PAIN. IF NO RELIEF CALL 911 (Patient taking differently: Place 0.4 mg under the tongue every 5 (five) minutes as needed for chest pain. ) 75 tablet 2  . OVER THE COUNTER MEDICATION Take 1-2 capsules by mouth See admin instructions. Health Plus - Super Colon Cleanse 1 capsule in the morning and 2 capsules at night as needed for bowel movement    . pantoprazole (PROTONIX) 40 MG tablet Take 1 tablet (40 mg total) by mouth 2 (two) times daily. 60 tablet 5  . Pitavastatin Calcium (LIVALO) 4 MG TABS Take 4 mg by mouth at bedtime.     . triamcinolone cream (KENALOG) 0.5 % Apply 1 application topically daily.     No current facility-administered medications for this visit.     Physical Exam: BP (!) 160/90   Pulse 73   Temp (!) 97.5 F (36.4 C) (Skin)   Resp 20   Ht 6\' 1"  (1.854 m)   Wt 201 lb (91.2 kg)   SpO2 96% Comment: RA  BMI 26.52 kg/m  He looks well. Cardiac exam shows regular rate and rhythm with normal heart sounds. Lung exam is clear. Chest incision is  healing well and the sternum is stable. His left forearm incision is well-healed and his left hand is neurovascularly intact. There is no peripheral edema.  Diagnostic Tests:  CLINICAL DATA:  Follow-up CABG.  EXAM: CHEST - 2 VIEW  COMPARISON:  June 15, 2018  FINDINGS: The patient is status post CABG. Sternotomy wires are intact. The heart, hila, and mediastinum are normal. Pleuroparenchymal thickening in the apices, right greater than left, are stable since at least January 2019, likely scarring. No nodules or masses. No focal infiltrates. A chronic left clavicular fractures again noted. No other acute abnormalities.  IMPRESSION: Status post CABG.  No acute interval change or  acute abnormality.   Electronically Signed   By: Dorise Bullion III M.D   On: 09/11/2018 12:50   Impression:  Overall I think he is making a great recovery following coronary bypass surgery.  He is already back to normal activity.  He is starting cardiac rehab in [facility] next week.  I told him he could discontinue the Imdur which was started to prevent radial artery spasm postoperatively.  Plan:  He will continue to follow-up with Dr.Berry and his PCP and will return to see me if he has any problems with his incisions.   Gaye Pollack, MD [facility] [phone]

## 2018-09-16 DIAGNOSIS — H52203 Unspecified astigmatism, bilateral: Secondary | ICD-10-CM | POA: Diagnosis not present

## 2018-09-16 DIAGNOSIS — H02054 Trichiasis without entropian left upper eyelid: Secondary | ICD-10-CM | POA: Diagnosis not present

## 2018-09-16 DIAGNOSIS — E119 Type 2 diabetes mellitus without complications: Secondary | ICD-10-CM | POA: Diagnosis not present

## 2018-09-16 DIAGNOSIS — H04123 Dry eye syndrome of bilateral lacrimal glands: Secondary | ICD-10-CM | POA: Diagnosis not present

## 2018-09-17 ENCOUNTER — Ambulatory Visit (INDEPENDENT_AMBULATORY_CARE_PROVIDER_SITE_OTHER): Payer: Medicare Other | Admitting: Allergy and Immunology

## 2018-09-17 ENCOUNTER — Telehealth: Payer: Self-pay | Admitting: *Deleted

## 2018-09-17 ENCOUNTER — Other Ambulatory Visit: Payer: Self-pay

## 2018-09-17 ENCOUNTER — Encounter: Payer: Self-pay | Admitting: Allergy and Immunology

## 2018-09-17 VITALS — BP 110/68 | HR 78 | Temp 98.0°F | Resp 16 | Ht 73.0 in | Wt 203.0 lb

## 2018-09-17 DIAGNOSIS — Z9861 Coronary angioplasty status: Secondary | ICD-10-CM

## 2018-09-17 DIAGNOSIS — L308 Other specified dermatitis: Secondary | ICD-10-CM | POA: Diagnosis not present

## 2018-09-17 DIAGNOSIS — L989 Disorder of the skin and subcutaneous tissue, unspecified: Secondary | ICD-10-CM

## 2018-09-17 DIAGNOSIS — I2510 Atherosclerotic heart disease of native coronary artery without angina pectoris: Secondary | ICD-10-CM

## 2018-09-17 MED ORDER — METHYLPREDNISOLONE ACETATE 80 MG/ML IJ SUSP
80.0000 mg | Freq: Once | INTRAMUSCULAR | Status: AC
  Administered 2018-09-17: 80 mg via INTRAMUSCULAR

## 2018-09-17 NOTE — Telephone Encounter (Signed)
Patient called upset states he is breaking out in hives and rash is getting worse. States he stopped the medication per his cardiologist to see if this would help but it did not would like to come in and be evaluated by Dr Neldon Mc. Advised to come at 4pm we would work him in patient lives 30 miles away but is heading to [place].

## 2018-09-17 NOTE — Progress Notes (Signed)
[place] [facility] [place] [facility] [place]   Follow-up Note  Referring Provider: [name], NP Primary Provider: [name], NP Date of Office Visit: 09/17/2018  Subjective:   Daniel Herrera (DOB: [birth year]) is a 78 y.o. male who returns to the [facility] on 09/17/2018 in re-evaluation of the following:  HPI:   Daniel Herrera presents to this clinic in evaluation of a dermatitis.  He has developed a relatively nonpruritic red dermatitis involving the scalp and neck and a little bit of his trunk over the course of the past several weeks.  This occurred even prior to starting his Haegarda during his last visit of 10 September 2018.  He has no associated systemic or constitutional symptoms.  He did stop his nitrates thinking that this might have been 1 of the agents giving rise to this reaction as it was a relatively new medication.  Allergies as of 09/17/2018      Reactions   Lisinopril Swelling   Facial and lip swelling. And throat swelling.    Benadryl [diphenhydramine Hcl] Swelling   Facial swelling from high dose (tolerates Advil PM as needed)   Darvocet [propoxyphene N-acetaminophen] Other (See Comments)   hallucination   Darvon [propoxyphene Hcl] Other (See Comments)   Hallucination   Tylenol [acetaminophen] Swelling   Foot swelling      Medication List      amiodarone 200 MG tablet Commonly known as: PACERONE Take 1 tablet (200 mg total) by mouth daily.   aspirin EC 81 MG tablet Take 1 tablet (81 mg total) by mouth daily.   azelastine 0.1 % nasal spray Commonly known as: ASTELIN Place 1 spray into both nostrils 2 (two) times daily.   EPINEPHrine 0.3 mg/0.3 mL Soaj injection Commonly known as: EPI-PEN Inject 0.3 mLs (0.3 mg total) into the muscle as needed (for allergic reaction).   fluticasone 50 MCG/ACT nasal spray Commonly known as: FLONASE Place 1 spray into both nostrils daily.   Haegarda 2000 units Solr Generic drug: C1 Esterase  Inhibitor (Human) Inject into the skin.   isosorbide mononitrate 30 MG 24 hr tablet Commonly known as: IMDUR TAKE 1 TABLET(30 MG) BY MOUTH DAILY   ketoconazole 2 % shampoo Commonly known as: NIZORAL Apply 1 application topically 2 (two) times daily.   levothyroxine 75 MCG tablet Commonly known as: SYNTHROID Take 75 mcg by mouth daily before breakfast.   Livalo 4 MG Tabs Generic drug: Pitavastatin Calcium Take 4 mg by mouth at bedtime.   metoprolol tartrate 25 MG tablet Commonly known as: LOPRESSOR Take 1 tablet (25 mg total) by mouth 2 (two) times daily.   montelukast 10 MG tablet Commonly known as: SINGULAIR Take 1 tablet (10 mg total) by mouth at bedtime.   nitroGLYCERIN 0.4 MG SL tablet Commonly known as: NITROSTAT DISSOLVE 1 TABLET UNDER TONGUE EVERY 5 MINUTES FOR 3 DOSES AS NEEDED CHEST PAIN. IF NO RELIEF CALL 911 What changed: See the new instructions.   OVER THE COUNTER MEDICATION Take 1-2 capsules by mouth See admin instructions. Health Plus - Super Colon Cleanse 1 capsule in the morning and 2 capsules at night as needed for bowel movement   pantoprazole 40 MG tablet Commonly known as: PROTONIX Take 1 tablet (40 mg total) by mouth 2 (two) times daily.   triamcinolone cream 0.5 % Commonly known as: KENALOG Apply 1 application topically daily.   Vitamin B-12 5000 MCG Tbdp Take 1,000 mcg by mouth daily.  Past Medical History:  Diagnosis Date  . Angio-edema   . Anxiety   . Arthritis   . CAD (coronary artery disease)    a. 02/2015: DES to mid-LAD  . GERD (gastroesophageal reflux disease)   . Gout   . High cholesterol   . History of prolonged Q-T interval on ECG   . Hx of Clostridium difficile infection   . Hx of umbilical hernia repair   . Personal history of digestive disease    gastric ulcer  . Prostate cancer ([place])   . Prostate enlargement   . Skin cancer    basal cell carcinoma  . Thyroid disease   . Urticaria     Past Surgical  History:  Procedure Laterality Date  . APPENDECTOMY    . BACK SURGERY    . CARDIAC CATHETERIZATION N/A 02/21/2015   Procedure: Left Heart Cath;  Surgeon: Lorretta Harp, MD;  Location: [facility];  Service: Cardiovascular;  Laterality: N/A;  . CATARACT EXTRACTION, BILATERAL    . CORONARY ARTERY BYPASS GRAFT N/A 05/24/2018   Procedure: CORONARY ARTERY BYPASS GRAFTING (CABG), ON PUMP, TIMES THREE, USING BILATERAL INTERNAL MAMMARY ARTERY AND HARVESTED LEFT RADIAL ARTERY;  Surgeon: Gaye Pollack, MD;  Location: [facility];  Service: Open Heart Surgery;  Laterality: N/A;  . LEFT HEART CATH AND CORONARY ANGIOGRAPHY N/A 05/22/2018   Procedure: LEFT HEART CATH AND CORONARY ANGIOGRAPHY;  Surgeon: Jettie Booze, MD;  Location: [facility];  Service: Cardiovascular;  Laterality: N/A;  . NASAL SINUS SURGERY    . PROSTATE BIOPSY    . RADIAL ARTERY HARVEST Left 05/24/2018   Procedure: RADIAL ARTERY HARVEST;  Surgeon: Gaye Pollack, MD;  Location: [facility];  Service: Open Heart Surgery;  Laterality: Left;  . right shoulder rotator cuff repair    . TEE WITHOUT CARDIOVERSION N/A 05/24/2018   Procedure: TRANSESOPHAGEAL ECHOCARDIOGRAM (TEE);  Surgeon: Gaye Pollack, MD;  Location: [facility];  Service: Open Heart Surgery;  Laterality: N/A;    Review of systems negative except as noted in HPI / PMHx or noted below:  Review of Systems  Constitutional: Negative.   HENT: Negative.   Eyes: Negative.   Respiratory: Negative.   Cardiovascular: Negative.   Gastrointestinal: Negative.   Genitourinary: Negative.   Musculoskeletal: Negative.   Skin: Negative.   Neurological: Negative.   Endo/Heme/Allergies: Negative.   Psychiatric/Behavioral: Negative.      Objective:   Vitals:   09/17/18 1622  BP: 110/68  Pulse: 78  Resp: 16  Temp: 98 F (36.7 C)  SpO2: 97%   Height: 6\' 1"  (185.4 cm)  Weight: 203 lb (92.1 kg)   Physical Exam Skin:    Findings: Rash (Diffuse erythroderma scalp,  blotchy erythema neck upper back.) present.     Diagnostics: none  Assessment and Plan:   1. Inflammatory dermatosis     1. Depo-Medrol 80 IM delivered in clinic today  It is not obvious why Daniel Herrera is having an inflammatory dermatosis.  There is not really a new medication that has been administered that may be responsible for this reaction and he has no associated systemic or constitutional symptoms and feels good in general.  We will give him an injection of a systemic steroid and he will keep in contact with [place] noting his response to this approach.  Certainly he is going to require further evaluation and treatment if this progresses or he develops associated systemic or constitutional symptoms.  Allena Katz, MD Allergy /  Immunology [facility] [place]

## 2018-09-17 NOTE — Patient Instructions (Signed)
  Depo-Medrol 80 IM delivered in clinic today

## 2018-09-18 ENCOUNTER — Encounter: Payer: Self-pay | Admitting: Allergy and Immunology

## 2018-09-25 DIAGNOSIS — Z125 Encounter for screening for malignant neoplasm of prostate: Secondary | ICD-10-CM | POA: Diagnosis not present

## 2018-09-25 DIAGNOSIS — E785 Hyperlipidemia, unspecified: Secondary | ICD-10-CM | POA: Diagnosis not present

## 2018-09-25 DIAGNOSIS — Z0000 Encounter for general adult medical examination without abnormal findings: Secondary | ICD-10-CM | POA: Diagnosis not present

## 2018-09-25 DIAGNOSIS — Z1331 Encounter for screening for depression: Secondary | ICD-10-CM | POA: Diagnosis not present

## 2018-09-25 DIAGNOSIS — Z9181 History of falling: Secondary | ICD-10-CM | POA: Diagnosis not present

## 2018-09-26 DIAGNOSIS — L570 Actinic keratosis: Secondary | ICD-10-CM | POA: Diagnosis not present

## 2018-09-26 DIAGNOSIS — L821 Other seborrheic keratosis: Secondary | ICD-10-CM | POA: Diagnosis not present

## 2018-09-26 DIAGNOSIS — C44311 Basal cell carcinoma of skin of nose: Secondary | ICD-10-CM | POA: Diagnosis not present

## 2018-09-26 DIAGNOSIS — L578 Other skin changes due to chronic exposure to nonionizing radiation: Secondary | ICD-10-CM | POA: Diagnosis not present

## 2018-09-30 DIAGNOSIS — Z951 Presence of aortocoronary bypass graft: Secondary | ICD-10-CM | POA: Diagnosis not present

## 2018-10-02 DIAGNOSIS — Z951 Presence of aortocoronary bypass graft: Secondary | ICD-10-CM | POA: Diagnosis not present

## 2018-10-03 DIAGNOSIS — Z951 Presence of aortocoronary bypass graft: Secondary | ICD-10-CM | POA: Diagnosis not present

## 2018-10-07 DIAGNOSIS — Z951 Presence of aortocoronary bypass graft: Secondary | ICD-10-CM | POA: Diagnosis not present

## 2018-10-09 DIAGNOSIS — Z951 Presence of aortocoronary bypass graft: Secondary | ICD-10-CM | POA: Diagnosis not present

## 2018-10-10 DIAGNOSIS — Z951 Presence of aortocoronary bypass graft: Secondary | ICD-10-CM | POA: Diagnosis not present

## 2018-10-14 DIAGNOSIS — Z951 Presence of aortocoronary bypass graft: Secondary | ICD-10-CM | POA: Diagnosis not present

## 2018-10-16 ENCOUNTER — Telehealth: Payer: Self-pay | Admitting: *Deleted

## 2018-10-16 DIAGNOSIS — Z951 Presence of aortocoronary bypass graft: Secondary | ICD-10-CM | POA: Diagnosis not present

## 2018-10-16 NOTE — Telephone Encounter (Signed)
Called and left detailed message in regards to change and tammy being out of office until Monday

## 2018-10-16 NOTE — Telephone Encounter (Signed)
Patient called would like Haegarda be switched to Firazyr due to the [place] taking to long to put together and admin. States he spoke to [employer] and they would now approved it call [phone] [name] please help. Dr Neldon Mc advise

## 2018-10-16 NOTE — Telephone Encounter (Signed)
Please inform patient that when [name] returns from vacation next week she will get this arranged.

## 2018-10-17 DIAGNOSIS — Z951 Presence of aortocoronary bypass graft: Secondary | ICD-10-CM | POA: Diagnosis not present

## 2018-10-18 DIAGNOSIS — Z20828 Contact with and (suspected) exposure to other viral communicable diseases: Secondary | ICD-10-CM | POA: Diagnosis not present

## 2018-10-21 DIAGNOSIS — Z951 Presence of aortocoronary bypass graft: Secondary | ICD-10-CM | POA: Diagnosis not present

## 2018-10-21 NOTE — Telephone Encounter (Signed)
Will work on approval and be in touch with patient

## 2018-10-23 DIAGNOSIS — Z951 Presence of aortocoronary bypass graft: Secondary | ICD-10-CM | POA: Diagnosis not present

## 2018-10-24 DIAGNOSIS — Z951 Presence of aortocoronary bypass graft: Secondary | ICD-10-CM | POA: Diagnosis not present

## 2018-10-28 DIAGNOSIS — Z951 Presence of aortocoronary bypass graft: Secondary | ICD-10-CM | POA: Diagnosis not present

## 2018-10-30 ENCOUNTER — Telehealth: Payer: Self-pay | Admitting: *Deleted

## 2018-10-30 DIAGNOSIS — Z951 Presence of aortocoronary bypass graft: Secondary | ICD-10-CM | POA: Diagnosis not present

## 2018-10-30 MED ORDER — FLUCONAZOLE 150 MG PO TABS: 150.0000 mg | ORAL_TABLET | Freq: Once | ORAL | 0 refills | Status: AC

## 2018-10-30 NOTE — Telephone Encounter (Signed)
Called patient to see if he had gotten restarted on hi Tahkzyro and he advised he had and he is happy with it.  He did advise that he is having issue with thrush and has seen Dr Benjamine Mola but he did not help him.  Per Dr Neldon Mc we can try giving him Diflucan 150mg  tablet today then repeat in one week. Rx sent and advised patient if he doesn't improve to let us know

## 2018-11-04 DIAGNOSIS — Z951 Presence of aortocoronary bypass graft: Secondary | ICD-10-CM | POA: Diagnosis not present

## 2018-11-06 DIAGNOSIS — Z951 Presence of aortocoronary bypass graft: Secondary | ICD-10-CM | POA: Diagnosis not present

## 2018-11-07 DIAGNOSIS — Z951 Presence of aortocoronary bypass graft: Secondary | ICD-10-CM | POA: Diagnosis not present

## 2018-11-11 DIAGNOSIS — Z951 Presence of aortocoronary bypass graft: Secondary | ICD-10-CM | POA: Diagnosis not present

## 2018-11-13 DIAGNOSIS — Z951 Presence of aortocoronary bypass graft: Secondary | ICD-10-CM | POA: Diagnosis not present

## 2018-11-14 DIAGNOSIS — Z951 Presence of aortocoronary bypass graft: Secondary | ICD-10-CM | POA: Diagnosis not present

## 2018-11-18 DIAGNOSIS — Z951 Presence of aortocoronary bypass graft: Secondary | ICD-10-CM | POA: Diagnosis not present

## 2018-11-19 DIAGNOSIS — E1169 Type 2 diabetes mellitus with other specified complication: Secondary | ICD-10-CM | POA: Diagnosis not present

## 2018-11-19 DIAGNOSIS — E039 Hypothyroidism, unspecified: Secondary | ICD-10-CM | POA: Diagnosis not present

## 2018-11-19 DIAGNOSIS — Z139 Encounter for screening, unspecified: Secondary | ICD-10-CM | POA: Diagnosis not present

## 2018-11-19 DIAGNOSIS — B379 Candidiasis, unspecified: Secondary | ICD-10-CM | POA: Diagnosis not present

## 2018-11-19 DIAGNOSIS — E785 Hyperlipidemia, unspecified: Secondary | ICD-10-CM | POA: Diagnosis not present

## 2018-11-24 ENCOUNTER — Other Ambulatory Visit: Payer: Self-pay | Admitting: Allergy and Immunology

## 2018-11-25 IMAGING — CT CT CERVICAL SPINE W/O CM
4 of 10 series · 9 of 33 positions shown, 10 images · non-contrast
Comparison: None.

CLINICAL DATA: Head injury after fall.  No loss of consciousness.

EXAM:
CT HEAD WITHOUT CONTRAST
CT MAXILLOFACIAL WITHOUT CONTRAST
CT CERVICAL SPINE WITHOUT CONTRAST
TECHNIQUE: Multidetector CT imaging of the head, cervical spine, and
maxillofacial structures were performed using the standard protocol
without intravenous contrast. Multiplanar CT image reconstructions
of the cervical spine and maxillofacial structures were also
generated.

[Series 4: head bone · axial · 0.43mm/px · z∈[+1238,+1296]mm · 2 of 89 slices shown]
[im 30/89  bone]
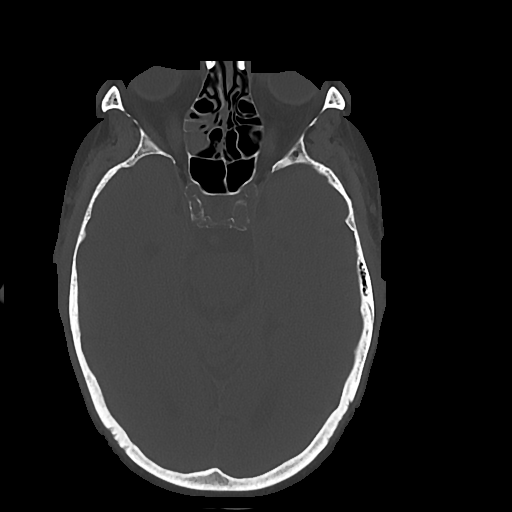
[im 59/89  bone]
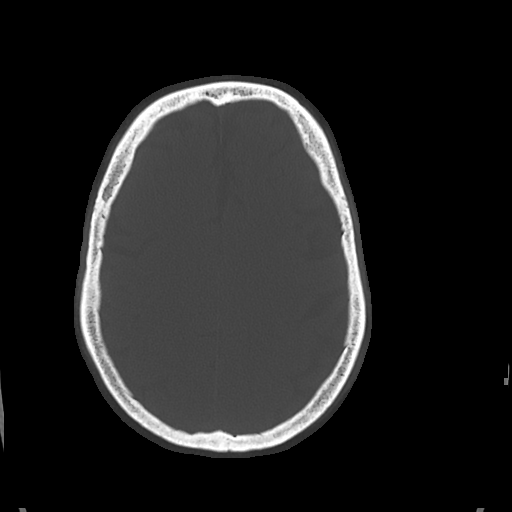

[Series 7: maxilllofacial 2.0 hr40 3 · axial · 0.37mm/px · z∈[+1177,+1237]mm · 2 of 92 slices shown, 3 images]
[im 31/92  soft-tissue]
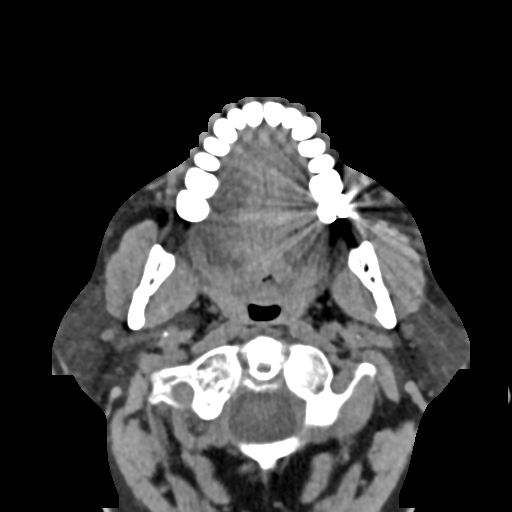
[im 31/92  bone]
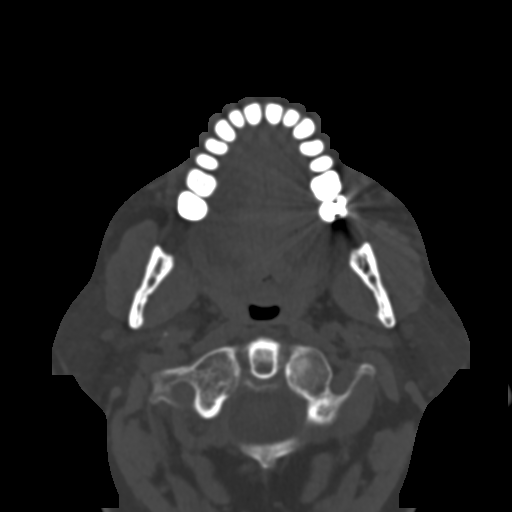
[im 61/92  bone]
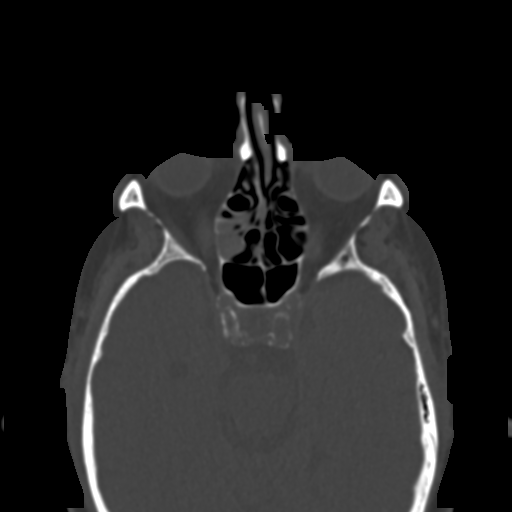

[Series 9: maxilllofacial 2.0 hr59 3 · axial · 0.37mm/px · z∈[+1177,+1237]mm · 2 of 92 slices shown]
[im 31/92  bone]
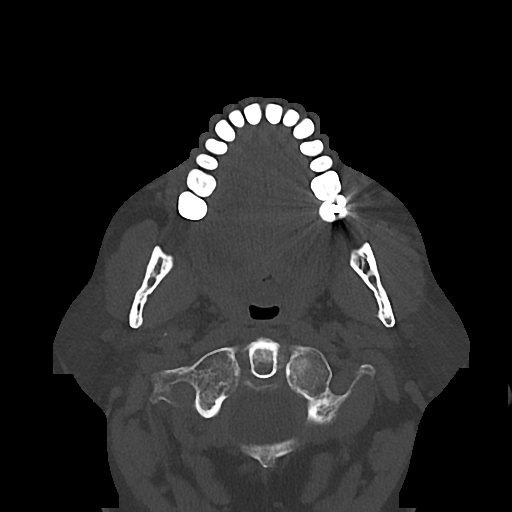
[im 61/92  bone]
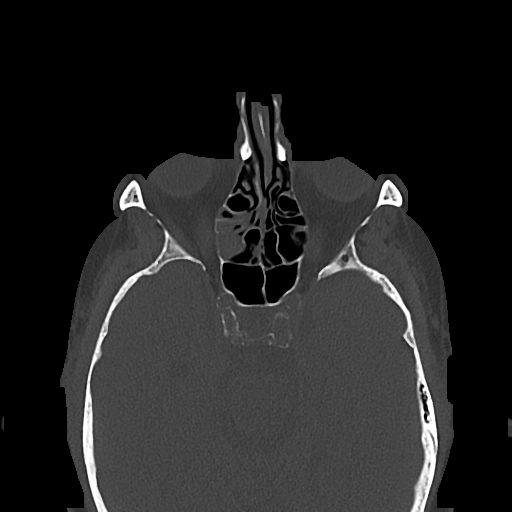

[Series 12: st sag · sagittal · 0.32mm/px · 3 of 76 slices shown]
[im 19/76  bone]
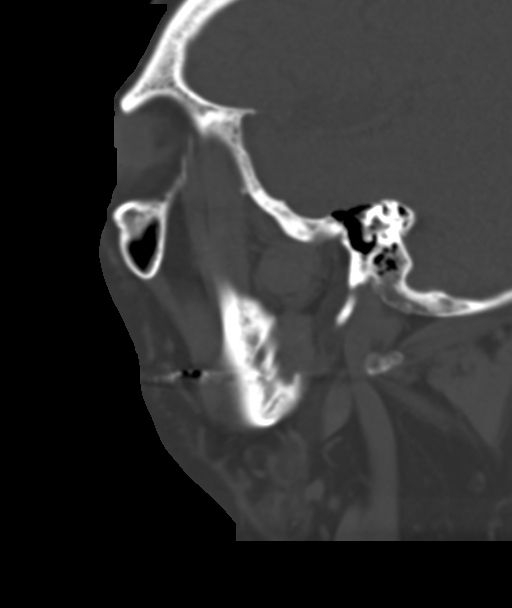
[im 38/76  bone]
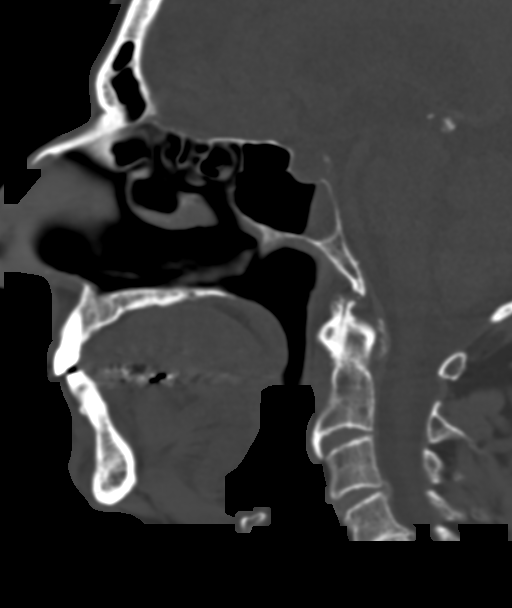
[im 57/76  bone]
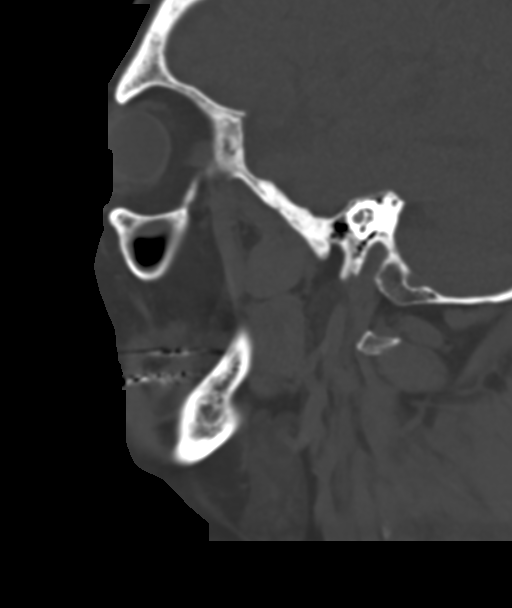

[9 of 33 positions shown; findings below may reference images not displayed]

FINDINGS: CT HEAD FINDINGS

Brain: Mild diffuse cortical atrophy is noted. Mild chronic ischemic
white matter disease is noted. No mass effect or midline shift is
noted. Ventricular size is within normal limits. There is no
evidence of mass lesion, hemorrhage or acute infarction.

Vascular: No hyperdense vessel or unexpected calcification.

Skull: Normal. Negative for fracture or focal lesion.

Other: Large left parietal scalp hematoma is noted.

CT MAXILLOFACIAL FINDINGS

Osseous: No fracture or mandibular dislocation. No destructive
process.

Orbits: Negative. No traumatic or inflammatory finding.

Sinuses: Bilateral maxillary sinusitis is noted.

Soft tissues: Negative.

CT CERVICAL SPINE FINDINGS

Alignment: Normal.

Skull base and vertebrae: No acute fracture. No primary bone lesion
or focal pathologic process.

Soft tissues and spinal canal: No prevertebral fluid or swelling. No
visible canal hematoma.

Disc levels: Severe degenerative disc disease is noted at C4-5, C5-6
and C6-7 with anterior posterior osteophyte formation.

Upper chest: Negative.

Other: None.
IMPRESSION: Mild diffuse cortical atrophy. Mild chronic ischemic white matter
disease. Large left parietal scalp hematoma. No acute intracranial
abnormality seen.

Bilateral maxillary sinusitis is noted. No traumatic injury seen in
maxillofacial region.

Severe multilevel degenerative disc disease. No acute abnormality
seen in the cervical spine.

## 2018-11-26 DIAGNOSIS — L570 Actinic keratosis: Secondary | ICD-10-CM | POA: Diagnosis not present

## 2018-11-26 DIAGNOSIS — L219 Seborrheic dermatitis, unspecified: Secondary | ICD-10-CM | POA: Diagnosis not present

## 2018-12-04 DIAGNOSIS — K219 Gastro-esophageal reflux disease without esophagitis: Secondary | ICD-10-CM | POA: Diagnosis not present

## 2018-12-04 DIAGNOSIS — K141 Geographic tongue: Secondary | ICD-10-CM

## 2018-12-04 DIAGNOSIS — R490 Dysphonia: Secondary | ICD-10-CM

## 2018-12-04 DIAGNOSIS — J383 Other diseases of vocal cords: Secondary | ICD-10-CM | POA: Diagnosis not present

## 2018-12-04 DIAGNOSIS — Z955 Presence of coronary angioplasty implant and graft: Secondary | ICD-10-CM | POA: Diagnosis not present

## 2018-12-04 HISTORY — DX: Geographic tongue: K14.1

## 2018-12-04 HISTORY — DX: Dysphonia: R49.0

## 2018-12-30 DIAGNOSIS — H1132 Conjunctival hemorrhage, left eye: Secondary | ICD-10-CM | POA: Diagnosis not present

## 2019-01-02 ENCOUNTER — Encounter: Payer: Self-pay | Admitting: Gastroenterology

## 2019-01-02 ENCOUNTER — Ambulatory Visit (INDEPENDENT_AMBULATORY_CARE_PROVIDER_SITE_OTHER): Payer: Medicare Other | Admitting: Gastroenterology

## 2019-01-02 ENCOUNTER — Other Ambulatory Visit: Payer: Self-pay

## 2019-01-02 VITALS — BP 128/74 | HR 62 | Temp 97.6°F | Ht 73.0 in | Wt 207.0 lb

## 2019-01-02 DIAGNOSIS — K219 Gastro-esophageal reflux disease without esophagitis: Secondary | ICD-10-CM

## 2019-01-02 NOTE — Patient Instructions (Signed)
If you are age 80 or older, your body mass index should be between 23-30. Your Body mass index is 27.31 kg/m. If this is out of the aforementioned range listed, please consider follow up with your Primary Care Provider.  If you are age 19 or younger, your body mass index should be between 19-25. Your Body mass index is 27.31 kg/m. If this is out of the aformentioned range listed, please consider follow up with your Primary Care Provider.   You have been scheduled for an endoscopy. Please follow written instructions given to you at your visit today. If you use inhalers (even only as needed), please bring them with you on the day of your procedure. Your physician has requested that you go to www.startemmi.com and enter the access code given to you at your visit today. This web site gives a general overview about your procedure. However, you should still follow specific instructions given to you by our office regarding your preparation for the procedure.  Thank you,  Dr. Jackquline Denmark

## 2019-01-02 NOTE — Progress Notes (Signed)
IMPRESSION and PLAN:    #1. GERD with possible extra esophageal manifestations of reflux. Had ENT eval by Dr Elyse Jarvis and allergy evaluation by Dr. Neldon Mc #2. Rectal bleeding -resolved. (likely due to diverticular bleed, taken off plavix by cardiology). Neg colon 07/2012 for diverticular disease. #2. Diarrhea (better) #3.H/O polyps     Plan: - EGD.  I discussed risks and benefits. - Protonix 40mg  po bid to continue.  HPI:    Chief Complaint:   Daniel Herrera is a 78 y.o. male  For follow-up visit. Seen by Dr. Neldon Mc and ENT Has been having "phlegm in the throat" specially in the morning. Told that he has reflux with extra esophageal manifestations. He was referred here for possible further evaluation by means of EGD. No odynophagia or dysphagia. Denies use of citrus drinks, excessive sodas, chocolates or mints.  No nonsteroidals.  This is despite of Protonix twice daily.  Occasional heartburn.  No abdominal pain  No fever or chills.  Previous records reviewed: -Colonoscopy 07/11/2012: Pancolonic diverticulosis predominantly in the left colon, small internal hemorrhoids.  Otherwise normal colonoscopy.  No radiation-induced changes in the rectum.  Has previous history of colonic polyps.   Past Medical History:  Diagnosis Date  . Angio-edema   . Anxiety   . Arthritis   . CAD (coronary artery disease)    a. 02/2015: DES to mid-LAD  . Geographic tongue   . GERD (gastroesophageal reflux disease)   . Gout   . High cholesterol   . History of prolonged Q-T interval on ECG   . Hx of Clostridium difficile infection   . Hx of umbilical hernia repair   . Personal history of digestive disease    gastric ulcer  . Prostate cancer ([place])   . Prostate enlargement   . Skin cancer    basal cell carcinoma  . Thyroid disease   . Urticaria     Current Outpatient Medications  Medication Sig Dispense Refill  . aspirin EC 81 MG tablet Take 1 tablet (81 mg total) by mouth  daily. 90 tablet 3  . azelastine (ASTELIN) 0.1 % nasal spray Place 1 spray into both nostrils 2 (two) times daily. 30 mL 5  . C1 Esterase Inhibitor, Human, (HAEGARDA) 2000 units SOLR Inject into the skin.    . Cyanocobalamin (VITAMIN B-12) 5000 MCG TBDP Take 1,000 mcg by mouth daily.    [name] EPINEPHRINE 0.3 mg/0.3 mL IJ SOAJ injection INJECT 0.3 ML(0.3MG ) INTO THE MUSCLE AS NEEDED FOR ALLERGIC REACTION 2 each 1  . fluticasone (FLONASE) 50 MCG/ACT nasal spray Place 1 spray into both nostrils daily. 16 g 4  . levothyroxine (SYNTHROID, LEVOTHROID) 75 MCG tablet Take 75 mcg by mouth daily before breakfast.    . metoprolol tartrate (LOPRESSOR) 25 MG tablet Take 1 tablet (25 mg total) by mouth 2 (two) times daily. 90 tablet 6  . nitroGLYCERIN (NITROSTAT) 0.4 MG SL tablet DISSOLVE 1 TABLET UNDER TONGUE EVERY 5 MINUTES FOR 3 DOSES AS NEEDED CHEST PAIN. IF NO RELIEF CALL 911 (Patient taking differently: Place 0.4 mg under the tongue every 5 (five) minutes as needed for chest pain. ) 75 tablet 2  . OVER THE COUNTER MEDICATION Take 1-2 capsules by mouth See admin instructions. Health Plus - Super Colon Cleanse 1 capsule in the morning and 2 capsules at night as needed for bowel movement    . pantoprazole (PROTONIX) 40 MG tablet Take 1 tablet (40 mg total) by mouth 2 (two) times  daily. 60 tablet 5  . Pitavastatin Calcium (LIVALO) 4 MG TABS Take 4 mg by mouth at bedtime.     [name] TAKHZYRO 300 MG/2ML SOLN every 14 (fourteen) days. Injection ever 14 days    . montelukast (SINGULAIR) 10 MG tablet Take 1 tablet (10 mg total) by mouth at bedtime. (Patient not taking: Reported on 01/02/2019) 30 tablet 5   No current facility-administered medications for this visit.     Past Surgical History:  Procedure Laterality Date  . APPENDECTOMY    . BACK SURGERY    . CARDIAC CATHETERIZATION N/A 02/21/2015   Procedure: Left Heart Cath;  Surgeon: Lorretta Harp, MD;  Location: [facility];  Service: Cardiovascular;   Laterality: N/A;  . CATARACT EXTRACTION, BILATERAL    . CORONARY ARTERY BYPASS GRAFT N/A 05/24/2018   Procedure: CORONARY ARTERY BYPASS GRAFTING (CABG), ON PUMP, TIMES THREE, USING BILATERAL INTERNAL MAMMARY ARTERY AND HARVESTED LEFT RADIAL ARTERY;  Surgeon: Gaye Pollack, MD;  Location: [facility];  Service: Open Heart Surgery;  Laterality: N/A;  . LEFT HEART CATH AND CORONARY ANGIOGRAPHY N/A 05/22/2018   Procedure: LEFT HEART CATH AND CORONARY ANGIOGRAPHY;  Surgeon: Jettie Booze, MD;  Location: [facility];  Service: Cardiovascular;  Laterality: N/A;  . NASAL SINUS SURGERY    . PROSTATE BIOPSY    . RADIAL ARTERY HARVEST Left 05/24/2018   Procedure: RADIAL ARTERY HARVEST;  Surgeon: Gaye Pollack, MD;  Location: [facility];  Service: Open Heart Surgery;  Laterality: Left;  . right shoulder rotator cuff repair    . TEE WITHOUT CARDIOVERSION N/A 05/24/2018   Procedure: TRANSESOPHAGEAL ECHOCARDIOGRAM (TEE);  Surgeon: Gaye Pollack, MD;  Location: [facility];  Service: Open Heart Surgery;  Laterality: N/A;    Family History  Problem Relation Age of Onset  . Cancer Mother   . Cancer Father        prostate  . Cancer Sister     Social History   Tobacco Use  . Smoking status: Never Smoker  . Smokeless tobacco: Never Used  Substance Use Topics  . Alcohol use: Never    Frequency: Never  . Drug use: No    Allergies  Allergen Reactions  . Lisinopril Swelling    Facial and lip swelling. And throat swelling.   . Benadryl [Diphenhydramine Hcl] Swelling    Facial swelling from high dose (tolerates Advil PM as needed)  . Darvocet [Propoxyphene N-Acetaminophen] Other (See Comments)    hallucination  . Darvon [Propoxyphene Hcl] Other (See Comments)    Hallucination   . Tylenol [Acetaminophen] Swelling    Foot swelling     Review of Systems: All systems reviewed and negative except where noted in HPI.    Physical Exam:     BP 128/74   Pulse 62   Temp 97.6 F (36.4 C)   Ht  6\' 1"  (1.854 m)   Wt 207 lb (93.9 kg)   BMI 27.31 kg/m  GENERAL:  Alert, oriented, cooperative, not in acute distress. PSYCH: :Pleasant, normal mood and affect. HEENT:  conjunctiva pink, mucous membranes moist, neck supple without masses. No jaundice. CARDIAC:  S1 S2 normal. No murmers. PULM: Normal respiratory effort, lungs CTA bilaterally, no wheezing. ABDOMEN: Inspection: No visible peristalsis, no abnormal pulsations, skin normal.  Palpation/percussion: Soft, nontender, nondistended, no rigidity, no abnormal dullness to percussion, no hepatosplenomegaly and no palpable abdominal masses.  Auscultation: Normal bowel sounds, no abdominal bruits. Rectal exam: Deferred SKIN:  turgor, no lesions  seen. Musculoskeletal:  Normal muscle tone, normal strength. NEURO: Alert and oriented x 3, no focal neurologic deficits.   Data Reviewed: I have personally reviewed following labs and imaging studies CBC Latest Ref Rng & Units 06/15/2018 05/27/2018 05/26/2018  WBC 4.0 - 10.5 K/uL 6.3 7.3 6.9  Hemoglobin 13.0 - 17.0 g/dL 11.2(L) 8.7(L) 8.1(L)  Hematocrit 39.0 - 52.0 % 36.1(L) 27.5(L) 25.9(L)  Platelets 150 - 400 K/uL 273 124(L) 115(L)   CMP Latest Ref Rng & Units 06/15/2018 05/28/2018 05/27/2018  Glucose 70 - 99 mg/dL 183(H) 114(H) 189(H)  BUN 8 - 23 mg/dL 23 21 23   Creatinine 0.61 - 1.24 mg/dL 1.53(H) 1.19 1.35(H)  Sodium 135 - 145 mmol/L 136 135 134(L)  Potassium 3.5 - 5.1 mmol/L 4.0 3.5 3.9  Chloride 98 - 111 mmol/L 105 106 103  CO2 22 - 32 mmol/L 24 24 24   Calcium 8.9 - 10.3 mg/dL 8.7(L) 7.8(L) 7.8(L)  Total Protein 6.5 - 8.1 g/dL - - -  Total Bilirubin 0.3 - 1.2 mg/dL - - -  Alkaline Phos 38 - 126 U/L - - -  AST 15 - 41 U/L - - -  ALT 0 - 44 U/L - - -       Radiology Studies: No results found. CT reviewed independently-diverticular disease I spent 15 minutes of face-to-face time with the patient. Greater than 50% of the time was spent counseling and coordinating care.    Devlyn Parish,MD 01/02/2019, 10:45 AM   CC Jolene Provost, PA-C

## 2019-01-06 ENCOUNTER — Telehealth: Payer: Self-pay | Admitting: Gastroenterology

## 2019-01-06 NOTE — Telephone Encounter (Signed)
Please see previous message -Is it appropriate to add on a colonoscopy to this patient's EGD?

## 2019-01-07 NOTE — Telephone Encounter (Signed)
That should be fine Lets proceed with EGD and colonoscopy with MiraLAX preparation He is off Plavix RE: diarrhea and rectal bleeding   Thx  RG

## 2019-01-08 DIAGNOSIS — Z860100 Personal history of colon polyps, unspecified: Secondary | ICD-10-CM

## 2019-01-08 DIAGNOSIS — K219 Gastro-esophageal reflux disease without esophagitis: Secondary | ICD-10-CM

## 2019-01-08 DIAGNOSIS — K625 Hemorrhage of anus and rectum: Secondary | ICD-10-CM

## 2019-01-08 DIAGNOSIS — R197 Diarrhea, unspecified: Secondary | ICD-10-CM

## 2019-01-08 MED ORDER — POLYETHYLENE GLYCOL 3350 17 GM/SCOOP PO POWD: 1.0000 | Freq: Every day | ORAL | 3 refills | Status: DC

## 2019-01-08 MED ORDER — BISACODYL EC 5 MG PO TBEC: DELAYED_RELEASE_TABLET | ORAL | 0 refills | Status: DC

## 2019-01-08 NOTE — Telephone Encounter (Signed)
Called and spoke with patient-patient informed of result note and MD recommendations; patient is agreeable with plan of care; Patient has been rescheduled for EGD and colonoscopy on 01/31/2019 at 3:00 pm; revised instructions will be mailed to the patient;  Patient verbalized understanding of information/instructions;  Patient was advised to call the office at [phone] if questions/concerns arise;

## 2019-01-15 ENCOUNTER — Other Ambulatory Visit: Payer: Self-pay | Admitting: Allergy and Immunology

## 2019-01-20 ENCOUNTER — Encounter: Payer: Medicare Other | Admitting: Gastroenterology

## 2019-01-20 DIAGNOSIS — C61 Malignant neoplasm of prostate: Secondary | ICD-10-CM | POA: Diagnosis not present

## 2019-01-31 ENCOUNTER — Ambulatory Visit (AMBULATORY_SURGERY_CENTER): Payer: Medicare Other | Admitting: Gastroenterology

## 2019-01-31 ENCOUNTER — Encounter: Payer: Self-pay | Admitting: Gastroenterology

## 2019-01-31 ENCOUNTER — Other Ambulatory Visit: Payer: Self-pay | Admitting: Gastroenterology

## 2019-01-31 ENCOUNTER — Other Ambulatory Visit: Payer: Self-pay

## 2019-01-31 VITALS — BP 113/70 | HR 65 | Temp 97.4°F | Resp 9 | Ht 73.0 in | Wt 207.0 lb

## 2019-01-31 DIAGNOSIS — K228 Other specified diseases of esophagus: Secondary | ICD-10-CM

## 2019-01-31 DIAGNOSIS — K2950 Unspecified chronic gastritis without bleeding: Secondary | ICD-10-CM

## 2019-01-31 DIAGNOSIS — K219 Gastro-esophageal reflux disease without esophagitis: Secondary | ICD-10-CM | POA: Diagnosis not present

## 2019-01-31 DIAGNOSIS — K5730 Diverticulosis of large intestine without perforation or abscess without bleeding: Secondary | ICD-10-CM | POA: Diagnosis not present

## 2019-01-31 DIAGNOSIS — K625 Hemorrhage of anus and rectum: Secondary | ICD-10-CM

## 2019-01-31 DIAGNOSIS — D124 Benign neoplasm of descending colon: Secondary | ICD-10-CM | POA: Diagnosis not present

## 2019-01-31 DIAGNOSIS — K648 Other hemorrhoids: Secondary | ICD-10-CM | POA: Diagnosis not present

## 2019-01-31 DIAGNOSIS — I2510 Atherosclerotic heart disease of native coronary artery without angina pectoris: Secondary | ICD-10-CM | POA: Diagnosis not present

## 2019-01-31 DIAGNOSIS — K449 Diaphragmatic hernia without obstruction or gangrene: Secondary | ICD-10-CM | POA: Diagnosis not present

## 2019-01-31 MED ORDER — HYDROCORTISONE (PERIANAL) 2.5 % EX CREA: 1.0000 "application " | TOPICAL_CREAM | Freq: Two times a day (BID) | CUTANEOUS | 2 refills | Status: DC

## 2019-01-31 MED ORDER — SODIUM CHLORIDE 0.9 % IV SOLN: 500.0000 mL | INTRAVENOUS | Status: DC

## 2019-01-31 NOTE — Op Note (Signed)
[facility] Patient Name: Daniel Herrera Procedure Date: 01/31/2019 2:50 PM MRN: [id number] Endoscopist: Jackquline Denmark , MD Age: 78 Referring MD:  Date of Birth: [birth year] Gender: Male Account #: [id number] Procedure:                Colonoscopy Indications:              Rectal bleeding Medicines:                Monitored Anesthesia Care Procedure:                Pre-Anesthesia Assessment:                           - Prior to the procedure, a History and Physical                            was performed, and patient medications and                            allergies were reviewed. The patient's tolerance of                            previous anesthesia was also reviewed. The risks                            and benefits of the procedure and the sedation                            options and risks were discussed with the patient.                            All questions were answered, and informed consent                            was obtained. Prior Anticoagulants: The patient has                            taken no previous anticoagulant or antiplatelet                            agents. ASA Grade Assessment: III - A patient with                            severe systemic disease. After reviewing the risks                            and benefits, the patient was deemed in                            satisfactory condition to undergo the procedure.                           After obtaining informed consent, the colonoscope  was passed under direct vision. Throughout the                            procedure, the patient's blood pressure, pulse, and                            oxygen saturations were monitored continuously. The                            Colonoscope was introduced through the anus and                            advanced to the 2 cm into the ileum. The                            colonoscopy was performed without difficulty. The                            patient tolerated the procedure well. The quality                            of the bowel preparation was adequate to identify                            polyps. The terminal ileum, ileocecal valve,                            appendiceal orifice, and rectum were photographed. Scope In: 3:05:12 PM Scope Out: 3:18:52 PM Scope Withdrawal Time: 0 hours 9 minutes 13 seconds  Total Procedure Duration: 0 hours 13 minutes 40 seconds  Findings:                 Mild melanosis coli was noted throughout the colon.                            Multiple random biopsies were obtained to rule out                            microscopic colitis. Terminal ileum was normal.                           A 8 mm polyp was found in the distal descending                            colon. The polyp was sessile. The polyp was removed                            with a cold snare. Resection and retrieval were                            complete.                           Multiple small and large-mouthed diverticula were  found in the sigmoid colon and few in ascending                            colon. It would give sigmoid colon a "Swiss cheese                            appearance". There was some degree of luminal                            narrowing consistent with muscular hypertrophy.                            Several diverticula had stool impacted. There was                            no endoscopic evidence of diverticulitis. No SCAD.                           Non-bleeding internal hemorrhoids were found during                            retroflexion. The hemorrhoids were small. No                            radiation-induced changes in the rectum.                           The exam was otherwise without abnormality on                            direct and retroflexion views. Complications:            No immediate complications. Estimated Blood Loss:     Estimated  blood loss: none. Impression:               -One 8 mm polyp in the distal descending colon,                            removed with a cold snare. Resected and retrieved.                           -Moderate to severe predominantly sigmoid                            diverticulosis.                           -Mild melanosis coli.                           -Internal hemorrhoids.                           -Otherwise grossly normal colonoscopy to TI. Recommendation:           - Patient has a contact number available for  emergencies. The signs and symptoms of potential                            delayed complications were discussed with the                            patient. Return to normal activities tomorrow.                            Written discharge instructions were provided to the                            patient.                           - High fiber diet.                           - Continue present medications.                           - Await pathology results.                           - Repeat colonoscopy for surveillance based on                            pathology results.                           - Use Generic HC Cream 2.5%: Apply externally BID                            for 2 weeks (2 refills)                           - FU in the GI clinic in 12 weeks. Jackquline Denmark, MD 01/31/2019 3:25:35 PM This report has been signed electronically.

## 2019-01-31 NOTE — Op Note (Signed)
[facility] Patient Name: Daniel Herrera Procedure Date: 01/31/2019 2:50 PM MRN: [id number] Endoscopist: Jackquline Denmark , MD Age: 78 Referring MD:  Date of Birth: 1940/08/30 Gender: Male Account #: [id number] Procedure:                Upper GI endoscopy Indications:              Heartburn with possible extra esophageal                            manifestations. Medicines:                Monitored Anesthesia Care Procedure:                Pre-Anesthesia Assessment:                           - Prior to the procedure, a History and Physical                            was performed, and patient medications and                            allergies were reviewed. The patient's tolerance of                            previous anesthesia was also reviewed. The risks                            and benefits of the procedure and the sedation                            options and risks were discussed with the patient.                            All questions were answered, and informed consent                            was obtained. Prior Anticoagulants: The patient has                            taken no previous anticoagulant or antiplatelet                            agents. ASA Grade Assessment: III - A patient with                            severe systemic disease. After reviewing the risks                            and benefits, the patient was deemed in                            satisfactory condition to undergo the procedure.                           -  Prior to the procedure, a History and Physical                            was performed, and patient medications and                            allergies were reviewed. The patient's tolerance of                            previous anesthesia was also reviewed. The risks                            and benefits of the procedure and the sedation                            options and risks were discussed with the patient.                           All questions were answered, and informed consent                            was obtained. Prior Anticoagulants: The patient has                            taken no previous anticoagulant or antiplatelet                            agents. ASA Grade Assessment: III - A patient with                            severe systemic disease. After reviewing the risks                            and benefits, the patient was deemed in                            satisfactory condition to undergo the procedure.                           After obtaining informed consent, the endoscope was                            passed under direct vision. Throughout the                            procedure, the patient's blood pressure, pulse, and                            oxygen saturations were monitored continuously. The                            Endoscope was introduced through the mouth, and  advanced to the second part of duodenum. The upper                            GI endoscopy was accomplished without difficulty.                            The patient tolerated the procedure well. Scope In: Scope Out: Findings:                 The examined esophagus was mildly tortuous but                            normal. Hence not biopsied.                           The Z-line was regular and was found 40 cm from the                            incisors.                           A 2 cm hiatal hernia was present.                           Localized minimal inflammation characterized by                            erythema was found in the gastric antrum. Biopsies                            were taken with a cold forceps for histology.                           The examined duodenum was normal. Biopsies were                            taken with a cold forceps for histology. Complications:            No immediate complications. Estimated Blood Loss:     Estimated blood  loss: none. Impression:               -Presbyesophagus.                           -Small hiatal hernia.                           -Minimal gastritis. Recommendation:           - Patient has a contact number available for                            emergencies. The signs and symptoms of potential                            delayed complications were discussed with the  patient. Return to normal activities tomorrow.                            Written discharge instructions were provided to the                            patient.                           - Resume previous diet.                           - Continue present medications.                           - Await pathology results.                           - Brochures regarding GERD. Nonpharmacologic means                            of reflux control.                           - Return to my office in 12 weeks. Jackquline Denmark, MD 01/31/2019 3:29:13 PM This report has been signed electronically.

## 2019-01-31 NOTE — Progress Notes (Signed)
Report given to PACU, vss 

## 2019-01-31 NOTE — Progress Notes (Signed)
Called to room to assist during endoscopic procedure.  Patient ID and intended procedure confirmed with present staff. Received instructions for my participation in the procedure from the performing physician.  

## 2019-01-31 NOTE — Patient Instructions (Signed)
Information on polyps, diverticulosis, hemorrhoids and hiatal hernias given to you today.  Await pathology results.  High fiber diet.  Use generic hydrocortisone cream 2.5%.  Apply externally twice a day for 2 weeks.  Return to GI clinic in 12 weeks.  YOU HAD AN ENDOSCOPIC PROCEDURE TODAY AT [facility]:   Refer to the procedure report that was given to you for any specific questions about what was found during the examination.  If the procedure report does not answer your questions, please call your gastroenterologist to clarify.  If you requested that your care partner not be given the details of your procedure findings, then the procedure report has been included in a sealed envelope for you to review at your convenience later.  YOU SHOULD EXPECT: Some feelings of bloating in the abdomen. Passage of more gas than usual.  Walking can help get rid of the air that was put into your GI tract during the procedure and reduce the bloating. If you had a lower endoscopy (such as a colonoscopy or flexible sigmoidoscopy) you may notice spotting of blood in your stool or on the toilet paper. If you underwent a bowel prep for your procedure, you may not have a normal bowel movement for a few days.  Please Note:  You might notice some irritation and congestion in your nose or some drainage.  This is from the oxygen used during your procedure.  There is no need for concern and it should clear up in a day or so.  SYMPTOMS TO REPORT IMMEDIATELY:   Following lower endoscopy (colonoscopy or flexible sigmoidoscopy):  Excessive amounts of blood in the stool  Significant tenderness or worsening of abdominal pains  Swelling of the abdomen that is new, acute  Fever of 100F or higher   Following upper endoscopy (EGD)  Vomiting of blood or coffee ground material  New chest pain or pain under the shoulder blades  Painful or persistently difficult swallowing  New shortness of breath  Fever of  100F or higher  Black, tarry-looking stools  For urgent or emergent issues, a gastroenterologist can be reached at any hour by calling [phone].   DIET:  We do recommend a small meal at first, but then you may proceed to your regular diet.  Drink plenty of fluids but you should avoid alcoholic beverages for 24 hours.  ACTIVITY:  You should plan to take it easy for the rest of today and you should NOT DRIVE or use heavy machinery until tomorrow (because of the sedation medicines used during the test).    FOLLOW UP: Our staff will call the number listed on your records 48-72 hours following your procedure to check on you and address any questions or concerns that you may have regarding the information given to you following your procedure. If we do not reach you, we will leave a message.  We will attempt to reach you two times.  During this call, we will ask if you have developed any symptoms of COVID 19. If you develop any symptoms (ie: fever, flu-like symptoms, shortness of breath, cough etc.) before then, please call [phone].  If you test positive for Covid 19 in the 2 weeks post procedure, please call and report this information to [place].    If any biopsies were taken you will be contacted by phone or by letter within the next 1-3 weeks.  Please call us at [phone] if you have not heard about the biopsies in 3  weeks.    SIGNATURES/CONFIDENTIALITY: You and/or your care partner have signed paperwork which will be entered into your electronic medical record.  These signatures attest to the fact that that the information above on your After Visit Summary has been reviewed and is understood.  Full responsibility of the confidentiality of this discharge information lies with you and/or your care-partner.

## 2019-02-04 ENCOUNTER — Telehealth: Payer: Self-pay

## 2019-02-04 NOTE — Telephone Encounter (Signed)
  Follow up Call-  Call back number 01/31/2019  Post procedure Call Back phone  # [phone]  Permission to leave phone message Yes  Some recent data might be hidden     Patient questions:  Do you have a fever, pain , or abdominal swelling? No. Pain Score  0 *  Have you tolerated food without any problems? Yes.    Have you been able to return to your normal activities? Yes.    Do you have any questions about your discharge instructions: Diet   No. Medications  No. Follow up visit  No.  Do you have questions or concerns about your Care? No.  Actions: * If pain score is 4 or above: No action needed, pain <4.  1. Have you developed a fever since your procedure? no  2.   Have you had an respiratory symptoms (SOB or cough) since your procedure? no  3.   Have you tested positive for COVID 19 since your procedure? no  4.   Have you had any family members/close contacts diagnosed with the COVID 19 since your procedure?  no   If yes to any of these questions please route to Joylene John, RN and Alphonsa Gin, Therapist, sports.

## 2019-02-07 ENCOUNTER — Encounter: Payer: Self-pay | Admitting: Gastroenterology

## 2019-02-20 ENCOUNTER — Other Ambulatory Visit: Payer: Self-pay | Admitting: Allergy and Immunology

## 2019-02-20 DIAGNOSIS — Z23 Encounter for immunization: Secondary | ICD-10-CM | POA: Diagnosis not present

## 2019-02-24 ENCOUNTER — Ambulatory Visit (INDEPENDENT_AMBULATORY_CARE_PROVIDER_SITE_OTHER): Payer: Medicare Other | Admitting: Cardiovascular Disease

## 2019-02-24 ENCOUNTER — Encounter: Payer: Self-pay | Admitting: Cardiovascular Disease

## 2019-02-24 ENCOUNTER — Other Ambulatory Visit: Payer: Self-pay

## 2019-02-24 VITALS — BP 134/64 | HR 63 | Ht 73.0 in | Wt 203.0 lb

## 2019-02-24 DIAGNOSIS — I2510 Atherosclerotic heart disease of native coronary artery without angina pectoris: Secondary | ICD-10-CM | POA: Diagnosis not present

## 2019-02-24 DIAGNOSIS — E785 Hyperlipidemia, unspecified: Secondary | ICD-10-CM | POA: Diagnosis not present

## 2019-02-24 DIAGNOSIS — Z9861 Coronary angioplasty status: Secondary | ICD-10-CM

## 2019-02-24 DIAGNOSIS — Z951 Presence of aortocoronary bypass graft: Secondary | ICD-10-CM

## 2019-02-24 DIAGNOSIS — I10 Essential (primary) hypertension: Secondary | ICD-10-CM | POA: Diagnosis not present

## 2019-02-24 MED ORDER — EZETIMIBE 10 MG PO TABS: 10.0000 mg | ORAL_TABLET | Freq: Every day | ORAL | 3 refills | Status: DC

## 2019-02-24 NOTE — Assessment & Plan Note (Signed)
History of essential hypertension with blood pressure measured today 134/64.  He is on metoprolol.  Continue current meds

## 2019-02-24 NOTE — Patient Instructions (Signed)
Medication Instructions:  Start taking 10mg  Zetia Daily.   If you need a refill on your cardiac medications before your next appointment, please call your pharmacy.   Lab work: Lipids and Hepatic Function in 2 months.  If you have labs (blood work) drawn today and your tests are completely normal, you will receive your results only by: [place] (if you have MyChart) OR A paper copy in the mail If you have any lab test that is abnormal or we need to change your treatment, we will call you to review the results.  Testing/Procedures: NONE  Follow-Up: At [facility], you and your health needs are our priority.  As part of our continuing mission to provide you with exceptional heart care, we have created designated Provider Care Teams.  These Care Teams include your primary Cardiologist (physician) and Advanced Practice Providers (APPs -  Physician Assistants and Nurse Practitioners) who all work together to provide you with the care you need, when you need it. You may see Quay Burow, MD or one of the following Advanced Practice Providers on your designated Care Team:    Kerin Ransom, PA-C  [place], [place]  Coletta Memos, [place]  Your physician wants you to follow-up in:1 year. You will receive a reminder letter in the mail two months in advance. If you don't receive a letter, please call our office to schedule the follow-up appointment.

## 2019-02-24 NOTE — Progress Notes (Signed)
02/24/2019 Daniel Herrera   April 30, 1940  [id number]  Primary Physician [name], NP Primary Cardiologist: Lorretta Harp MD Garret Reddish, [place], [place]  HPI:  Daniel Herrera is a 78 y.o.  married Caucasian male father of 3 children, grandfather of 6 grandchildren without prior heart diseasewho I last spoke to for a virtual telemedicine phone visit 07/19/2018.[name] He doeshave prostate cancerand has undergone radiation therapy followed by Dr. Alinda Money.[name] He's had been having chest pain for a month, worse the day of presentation 02/22/15. He presented to the emergency room where his EKG showed anterolateral T-wave inversion. His troponin level was 0.7. He was given sublingual nitroglycerin with some improvement in his symptoms but because of ongoing chest pain he was decided to bring him to the Cath Lab for urgent angiography and potential intervention. Problems include history of hypertension and hyperlipidemia. I brought him to the cath lab revealing high-grade proximal and mid LAD disease which I stented using 2 drug-eluting stents. He did have moderate first obtuse marginal branch and mid to distal AV groove circumflex disease with an EF of 45-50%. Myoview stress test performed 04/14/15 was nonischemic. He has complained of palpitations and a event monitor showed only PVCs.   He had an admission for unstable angina back in February and underwent cardiac catheterization by Dr. Irish Lack 05/22/2018 revealing three-vessel disease.  2 days later he underwent CABG x3 by Dr. Cyndia Bent with a LIMA to his LAD, left radial to an OM branch and a RIMA to the RCA.  His EF was normal.  He had some PAF on postop day 1 and was treated with amiodarone.  His postop course was otherwise fairly unremarkable.  He was recently seen in the ER 06/15/2018 for atypical chest pain and we were consulted.  His enzymes were negative and he was discharged home the following day.  He says that he feels the best he is felt in years  since his bypass operation.  He currently denies chest pain or shortness of breath.  Since I last spoke to him over 6 months ago he continues to do well.  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  His most recent lipid profile performed 11/19/2018 revealed total cholesterol 168, LDL 100 and HDL 39.   Current Meds  Medication Sig  . aspirin EC 81 MG tablet Take 1 tablet (81 mg total) by mouth daily.  [name] azelastine (ASTELIN) 0.1 % nasal spray Place 1 spray into both nostrils 2 (two) times daily.  . Cyanocobalamin (VITAMIN B-12) 5000 MCG TBDP Take 1,000 mcg by mouth daily.  [name] EPINEPHRINE 0.3 mg/0.3 mL IJ SOAJ injection INJECT 0.3 ML(0.3MG ) INTO THE MUSCLE AS NEEDED FOR ALLERGIC REACTION  . fluticasone (FLONASE) 50 MCG/ACT nasal spray SHAKE LIQUID AND USE 1 SPRAY IN EACH NOSTRIL DAILY  . levothyroxine (SYNTHROID, LEVOTHROID) 75 MCG tablet Take 75 mcg by mouth daily before breakfast.  . metoprolol tartrate (LOPRESSOR) 25 MG tablet Take 1 tablet (25 mg total) by mouth 2 (two) times daily.  . montelukast (SINGULAIR) 10 MG tablet Take 1 tablet (10 mg total) by mouth at bedtime.  . nitroGLYCERIN (NITROSTAT) 0.4 MG SL tablet DISSOLVE 1 TABLET UNDER TONGUE EVERY 5 MINUTES FOR 3 DOSES AS NEEDED CHEST PAIN. IF NO RELIEF CALL 911  . OVER THE COUNTER MEDICATION Take 1-2 capsules by mouth See admin instructions. Health Plus - Super Colon Cleanse 1 capsule in the morning and 2  capsules at night as needed for bowel movement  . pantoprazole (PROTONIX) 40 MG tablet Take 1 tablet (40 mg total) by mouth 2 (two) times daily.  . Pitavastatin Calcium (LIVALO) 4 MG TABS Take 4 mg by mouth at bedtime.   [name] TAKHZYRO 300 MG/2ML SOLN every 14 (fourteen) days. Injection ever 14 days     Allergies  Allergen Reactions  . Lisinopril Swelling    Facial and lip swelling. And throat swelling.   . Benadryl [Diphenhydramine Hcl] Swelling    Facial swelling from high dose (tolerates  Advil PM as needed)  . Darvocet [Propoxyphene N-Acetaminophen] Other (See Comments)    hallucination  . Darvon [Propoxyphene Hcl] Other (See Comments)    Hallucination   . Tylenol [Acetaminophen] Swelling    Foot swelling    Social History   Socioeconomic History  . Marital status: Married    Spouse name: Not on file  . Number of children: Not on file  . Years of education: Not on file  . Highest education level: Not on file  Occupational History  . Occupation: retired  Scientific laboratory technician  . Financial resource strain: Not on file  . Food insecurity    Worry: Not on file    Inability: Not on file  . Transportation needs    Medical: Not on file    Non-medical: Not on file  Tobacco Use  . Smoking status: Never Smoker  . Smokeless tobacco: Never Used  Substance and Sexual Activity  . Alcohol use: Never    Frequency: Never  . Drug use: No  . Sexual activity: Not on file  Lifestyle  . Physical activity    Days per week: Not on file    Minutes per session: Not on file  . Stress: Not on file  Relationships  . Social Herbalist on phone: Not on file    Gets together: Not on file    Attends religious service: Not on file    Active member of club or organization: Not on file    Attends meetings of clubs or organizations: Not on file    Relationship status: Not on file  . Intimate partner violence    Fear of current or ex partner: Not on file    Emotionally abused: Not on file    Physically abused: Not on file    Forced sexual activity: Not on file  Other Topics Concern  . Not on file  Social History Narrative  . Not on file     Review of Systems: General: negative for chills, fever, night sweats or weight changes.  Cardiovascular: negative for chest pain, dyspnea on exertion, edema, orthopnea, palpitations, paroxysmal nocturnal dyspnea or shortness of breath Dermatological: negative for rash Respiratory: negative for cough or wheezing Urologic: negative for  hematuria Abdominal: negative for nausea, vomiting, diarrhea, bright red blood per rectum, melena, or hematemesis Neurologic: negative for visual changes, syncope, or dizziness All other systems reviewed and are otherwise negative except as noted above.    Blood pressure 134/64, pulse 63, height 6\' 1"  (1.854 m), weight 203 lb (92.1 kg), SpO2 98 %.  General appearance: alert and no distress Neck: no adenopathy, no carotid bruit, no JVD, supple, symmetrical, trachea midline and thyroid not enlarged, symmetric, no tenderness/mass/nodules Lungs: clear to auscultation bilaterally Heart: regular rate and rhythm, S1, S2 normal, no murmur, click, rub or gallop Extremities: extremities normal, atraumatic, no cyanosis or edema Pulses: 2+ and symmetric Skin: Skin color, texture, turgor normal.  No rashes or lesions Neurologic: Alert and oriented X 3, normal strength and tone. Normal symmetric reflexes. Normal coordination and gait  EKG not performed today  ASSESSMENT AND PLAN:   Essential hypertension History of essential hypertension with blood pressure measured today 134/64.  He is on metoprolol.  Continue current meds  Dyslipidemia, goal LDL below 70 History of dyslipidemia on Livalo 4 mg a day with lipid profile performed 11/19/2018 revealing total cholesterol 168, LDL 100 and HDL 39.  I am going to add Zetia 10 mg a day and we will recheck a lipid liver profile in 2 months.  If he does not reach goal he may be a candidate for Repatha.  S/P CABG x 3 History of CAD status post proximal LAD stenting by myself using 2 drug-eluting stents 11/16.  He did have moderate first obtuse marginal branch as well as mid and distal AV groove circumflex disease with an EF of 45 to 50% at that time.  Myoview performed 04/14/2015 was nonischemic.  He was admitted with unstable angina in February of this year and underwent cardiac catheterization by Dr. Irish Lack 05/22/2018 revealing surgical disease.  He had CABG by  Dr. Darcey Nora with a LIMA to his LAD, RIMA to the RCA and a left radial to LM branch.  His postop course was unremarkable.  He has completely recuperated.  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      Lorretta Harp MD FACP,FACC,FAHA, [facility] 02/24/2019 10:45 AM

## 2019-02-24 NOTE — Assessment & Plan Note (Signed)
History of dyslipidemia on Livalo 4 mg a day with lipid profile performed 11/19/2018 revealing total cholesterol 168, LDL 100 and HDL 39.  I am going to add Zetia 10 mg a day and we will recheck a lipid liver profile in 2 months.  If he does not reach goal he may be a candidate for Repatha.

## 2019-02-24 NOTE — Assessment & Plan Note (Signed)
History of CAD status post proximal LAD stenting by myself using 2 drug-eluting stents 11/16.  He did have moderate first obtuse marginal branch as well as mid and distal AV groove circumflex disease with an EF of 45 to 50% at that time.  Myoview performed 04/14/2015 was nonischemic.  He was admitted with unstable angina in February of this year and underwent cardiac catheterization by Dr. Irish Lack 05/22/2018 revealing surgical disease.  He had CABG by Dr. Darcey Nora with a LIMA to his LAD, RIMA to the RCA and a left radial to LM branch.  His postop course was unremarkable.  He has completely recuperated.  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

## 2019-03-10 ENCOUNTER — Telehealth: Payer: Self-pay | Admitting: Radiology

## 2019-03-10 NOTE — Telephone Encounter (Signed)
Patient called lmom that he has hurt his back and he is in severe pain. Please call him to advise.

## 2019-03-11 ENCOUNTER — Ambulatory Visit: Payer: Medicare Other | Admitting: Allergy and Immunology

## 2019-03-11 NOTE — Telephone Encounter (Signed)
Please advise. Does not look like this patient has seen you before- not in [facility]. Currently scheduling new patients 12/23.

## 2019-03-11 NOTE — Telephone Encounter (Signed)
Scheduled with Dr. Junius Roads for 1520 on 12/9.

## 2019-03-11 NOTE — Telephone Encounter (Signed)
See if Dr. Junius Roads can see or other ortho if quicker and we have not seen him before

## 2019-03-12 ENCOUNTER — Other Ambulatory Visit: Payer: Self-pay

## 2019-03-12 ENCOUNTER — Ambulatory Visit (INDEPENDENT_AMBULATORY_CARE_PROVIDER_SITE_OTHER): Payer: Medicare Other | Admitting: Family Medicine

## 2019-03-12 ENCOUNTER — Encounter: Payer: Self-pay | Admitting: Family Medicine

## 2019-03-12 DIAGNOSIS — M5441 Lumbago with sciatica, right side: Secondary | ICD-10-CM

## 2019-03-12 MED ORDER — PREDNISONE 10 MG PO TABS: ORAL_TABLET | ORAL | 0 refills | Status: DC

## 2019-03-12 NOTE — Progress Notes (Signed)
Office Visit Note   Patient: Daniel Herrera           Date of Birth: [birth year]           MRN: [id number] Visit Date: 03/12/2019 Requested by: [name], NP [place],  [place] [id number] PCP: [name], NP  Subjective: Chief Complaint  Patient presents with  . Lower Back - Pain    Pain in the right buttock/hip and down the lateral right leg to the foot. Throbbing pain the leg "like a toothache." Some numbness in the lower leg. This pain started 1 week ago with moving some furniture.    HPI: He is here with low back and right leg pain.  Symptoms started last week after moving furniture.  Pain from the posterior lateral hip down the leg to the lateral calf.  Constant throbbing pain, like a toothache.  He is status post 3 different back surgeries in the past for disc herniations.  He was doing fine until recently.  He works on the farm, stays very active.              ROS: No bowel or bladder dysfunction, no fevers or chills.  All other systems were reviewed and are negative.  Objective: Vital Signs: There were no vitals taken for this visit.  Physical Exam:  General:  Alert and oriented, in no acute distress. Pulm:  Breathing unlabored. Psy:  Normal mood, congruent affect. Skin: No rash on his skin. Low back: No midline tenderness, moderate tenderness over the right greater trochanter.  No pain with internal hip rotation, negative straight leg raise.  Lower extremity strength and reflexes are still normal.  Imaging: None today.  Assessment & Plan: 1.  Low back pain with right-sided sciatica -Prednisone taper.  If severe pain persist, we could possibly do a one-time epidural steroid injection.  Physical therapy would be another consideration.     Procedures: No procedures performed  No notes on file     PMFS History: Patient Active Problem List   Diagnosis Date Noted  . PAF (paroxysmal atrial fibrillation) ([place]) 06/13/2018  . S/P CABG x 3  05/28/2018  . NSTEMI (non-ST elevated myocardial infarction) ([place]) 05/21/2018  . Tachycardia determined by examination of pulse   . Pre-operative clearance 01/02/2018  . Hematochezia 12/21/2017  . CKD (chronic kidney disease) stage 3, GFR 30-59 ml/min 12/21/2017  . GI bleed 12/21/2017  . Hereditary angioedema type 2 ([place]) 07/28/2017  . Enterotoxigenic Escherichia coli infection 05/24/2015  . STEC (Shiga toxin-producing Escherichia coli) infection 05/24/2015  . Colitis 05/23/2015  . History of prostate cancer   . Chronic diarrhea of unknown origin   . CAD S/P percutaneous coronary angioplasty   . Type 2 diabetes mellitus with unspecified complications ([place])   . Chest pain 04/08/2015  . Dyspnea 04/08/2015  . Palpitations 03/15/2015  . History of non-ST elevation myocardial infarction (NSTEMI) 02/22/2015  . Abnormal EKG 02/21/2015  . Essential hypertension 02/21/2015  . Dyslipidemia, goal LDL below 70 02/21/2015  . Malignant neoplasm of prostate ([place]) 08/07/2014  . Acute interstitial pneumonitis ([place]) 12/17/2013   Past Medical History:  Diagnosis Date  . Angio-edema   . Anxiety   . Arthritis   . CAD (coronary artery disease)    a. 02/2015: DES to mid-LAD  . Cataract   . Geographic tongue   . GERD (gastroesophageal reflux disease)   . Gout   . High cholesterol   . History  of prolonged Q-T interval on ECG   . Hx of Clostridium difficile infection   . Hx of umbilical hernia repair   . Hypertension   . Myocardial infarction ([place])   . Personal history of digestive disease    gastric ulcer  . Prostate cancer ([place])   . Prostate enlargement   . Skin cancer    basal cell carcinoma  . Thyroid disease   . Urticaria     Family History  Problem Relation Age of Onset  . Cancer Mother   . Stomach cancer Mother   . Cancer Father        prostate  . Cancer Sister     Past Surgical History:  Procedure Laterality Date  . APPENDECTOMY    . BACK SURGERY    . CARDIAC  CATHETERIZATION N/A 02/21/2015   Procedure: Left Heart Cath;  Surgeon: Lorretta Harp, MD;  Location: [facility];  Service: Cardiovascular;  Laterality: N/A;  . CATARACT EXTRACTION, BILATERAL    . CORONARY ARTERY BYPASS GRAFT N/A 05/24/2018   Procedure: CORONARY ARTERY BYPASS GRAFTING (CABG), ON PUMP, TIMES THREE, USING BILATERAL INTERNAL MAMMARY ARTERY AND HARVESTED LEFT RADIAL ARTERY;  Surgeon: Gaye Pollack, MD;  Location: [place];  Service: Open Heart Surgery;  Laterality: N/A;  . LEFT HEART CATH AND CORONARY ANGIOGRAPHY N/A 05/22/2018   Procedure: LEFT HEART CATH AND CORONARY ANGIOGRAPHY;  Surgeon: Jettie Booze, MD;  Location: [facility];  Service: Cardiovascular;  Laterality: N/A;  . NASAL SINUS SURGERY    . PROSTATE BIOPSY    . RADIAL ARTERY HARVEST Left 05/24/2018   Procedure: RADIAL ARTERY HARVEST;  Surgeon: Gaye Pollack, MD;  Location: [facility];  Service: Open Heart Surgery;  Laterality: Left;  . right shoulder rotator cuff repair    . TEE WITHOUT CARDIOVERSION N/A 05/24/2018   Procedure: TRANSESOPHAGEAL ECHOCARDIOGRAM (TEE);  Surgeon: Gaye Pollack, MD;  Location: [facility];  Service: Open Heart Surgery;  Laterality: N/A;   Social History   Occupational History  . Occupation: retired  Tobacco Use  . Smoking status: Never Smoker  . Smokeless tobacco: Never Used  Substance and Sexual Activity  . Alcohol use: Never    Frequency: Never  . Drug use: No  . Sexual activity: Not on file

## 2019-03-18 ENCOUNTER — Other Ambulatory Visit: Payer: Self-pay | Admitting: Allergy and Immunology

## 2019-03-20 ENCOUNTER — Telehealth: Payer: Self-pay | Admitting: Radiology

## 2019-03-20 DIAGNOSIS — M5441 Lumbago with sciatica, right side: Secondary | ICD-10-CM

## 2019-03-20 MED ORDER — BACLOFEN 10 MG PO TABS: 5.0000 mg | ORAL_TABLET | Freq: Three times a day (TID) | ORAL | 3 refills | Status: DC | PRN

## 2019-03-20 NOTE — Addendum Note (Signed)
Addended by: Hortencia Pilar on: 03/20/2019 12:14 PM   Modules accepted: Orders

## 2019-03-20 NOTE — Telephone Encounter (Signed)
Baclofen Rx sent - use caution, may cause drowsiness.  Referral requested to [facility] in [place].  Referral to [facility] for lumbar ESI requested.

## 2019-03-20 NOTE — Telephone Encounter (Signed)
I called and advised the patient of the plan. He agrees to try these (has had ESIs in the past without problem). The patient also stated he has had stomach issues when taking medications, but he will try the baclofen and see if it will help without causing any problems with his stomach.

## 2019-03-20 NOTE — Telephone Encounter (Signed)
Patient called and left message on voicemail, states that he saw Dr. Junius Roads on 03/12/2019 and that he has done steroid pak bu that it did not help and he is still hurts in the Right leg and hip and he wants someone to help him---Can Dr. Junius Roads advise on this?

## 2019-04-08 ENCOUNTER — Encounter: Payer: Self-pay | Admitting: Allergy and Immunology

## 2019-04-08 ENCOUNTER — Ambulatory Visit (INDEPENDENT_AMBULATORY_CARE_PROVIDER_SITE_OTHER): Payer: Medicare Other | Admitting: Allergy and Immunology

## 2019-04-08 ENCOUNTER — Telehealth: Payer: Self-pay

## 2019-04-08 ENCOUNTER — Other Ambulatory Visit: Payer: Self-pay

## 2019-04-08 VITALS — BP 102/70 | HR 69 | Temp 97.9°F | Resp 19 | Ht 73.0 in

## 2019-04-08 DIAGNOSIS — R140 Abdominal distension (gaseous): Secondary | ICD-10-CM

## 2019-04-08 DIAGNOSIS — K219 Gastro-esophageal reflux disease without esophagitis: Secondary | ICD-10-CM | POA: Diagnosis not present

## 2019-04-08 DIAGNOSIS — D841 Defects in the complement system: Secondary | ICD-10-CM | POA: Diagnosis not present

## 2019-04-08 DIAGNOSIS — J3089 Other allergic rhinitis: Secondary | ICD-10-CM

## 2019-04-08 NOTE — Telephone Encounter (Signed)
Called pt and LVM to ask what the appt was needed for tomorrow 04/09/19.

## 2019-04-08 NOTE — Progress Notes (Signed)
[facility] [place] [facility] [place]   Follow-up Note  Referring Provider: [name], NP Primary Provider: [name], NP Date of Office Visit: 04/08/2019  Subjective:   Daniel Herrera (DOB: April 04, 1940) is a 79 y.o. male who returns to the [facility] [place] on 04/08/2019 in re-evaluation of the following:  HPI: Daniel Herrera returns to this clinic in reevaluation of type II hereditary angioedema treated with [employer]ina [employer] and history of allergic rhinitis and LPR and a history of interstitial lung disease.  His last visit to this clinic was 17 September 2018 at which point in time he presented with a unexplained inflammatory dermatosis that was treated with a systemic steroid..  Overall he has done very well regarding his hereditary angioedema and has not had any swelling episodes.  He has changed from [place] to [facility] [place] because of several different side effects that developed with Ha[place] and a logistical issue that developed with Haegarda.  He has had evaluation for his hoarseness with ENT and GI and it appears as though most of his issues tied up with LPR which at this point he thinks is under pretty good control using his pantoprazole.  His nose is doing quite well at this point.  He has not been having any lower airway symptoms such as cough or wheezing or shortness of breath.  He has not required a systemic steroid or an antibiotic to treat any type of airway issue.  He has been having some abdominal bloating recently.  He has been placed on a probiotic which has not really helped this issue.  He has received the flu vaccine.  Allergies as of 04/08/2019      Reactions   Lisinopril Swelling   Facial and lip swelling. And throat swelling.    Benadryl [diphenhydramine Hcl] Swelling   Facial swelling from high dose (tolerates Advil PM as needed)   Darvocet [propoxyphene N-acetaminophen] Other (See Comments)   hallucination   Darvon [propoxyphene Hcl] Other (See  Comments)   Hallucination   Tylenol [acetaminophen] Swelling   Foot swelling      Medication List      aspirin EC 81 MG tablet Take 1 tablet (81 mg total) by mouth daily.   azelastine 0.1 % nasal spray Commonly known as: ASTELIN Place 1 spray into both nostrils 2 (two) times daily.   baclofen 10 MG tablet Commonly known as: LIORESAL Take 0.5-1 tablets (5-10 mg total) by mouth 3 (three) times daily as needed for muscle spasms.   EPINEPHrine 0.3 mg/0.3 mL Soaj injection Commonly known as: EPI-PEN INJECT 0.3 ML(0.3MG ) INTO THE MUSCLE AS NEEDED FOR ALLERGIC REACTION   ezetimibe 10 MG tablet Commonly known as: ZETIA Take 1 tablet (10 mg total) by mouth daily.   fluticasone 50 MCG/ACT nasal spray Commonly known as: FLONASE SHAKE LIQUID AND USE 1 SPRAY IN EACH NOSTRIL DAILY   icatibant 30 MG/3ML injection Commonly known as: FIRAZYR   levothyroxine 88 MCG tablet Commonly known as: SYNTHROID TK 1 T PO D   Livalo 4 MG Tabs Generic drug: Pitavastatin Calcium Take 4 mg by mouth at bedtime.   metoprolol tartrate 25 MG tablet Commonly known as: LOPRESSOR Take 1 tablet (25 mg total) by mouth 2 (two) times daily.   montelukast 10 MG tablet Commonly known as: SINGULAIR Take 1 tablet (10 mg total) by mouth at bedtime.   nitroGLYCERIN 0.4 MG SL tablet Commonly known as: NITROSTAT DISSOLVE 1 TABLET UNDER TONGUE EVERY 5 MINUTES  FOR 3 DOSES AS NEEDED CHEST PAIN. IF NO RELIEF CALL 911   OVER THE COUNTER MEDICATION Take 1-2 capsules by mouth See admin instructions. Health Plus - Super Colon Cleanse 1 capsule in the morning and 2 capsules at night as needed for bowel movement   pantoprazole 40 MG tablet Commonly known as: PROTONIX TAKE 1 TABLET(40 MG) BY MOUTH TWICE DAILY     Takhzyro 300 MG/2ML Soln Generic drug: Lanadelumab-flyo every 14 (fourteen) days. Injection ever 14 days   Vitamin B-12 5000 MCG Tbdp Take 1,000 mcg by mouth daily.       Past Medical History:    Diagnosis Date  . Angio-edema   . Anxiety   . Arthritis   . CAD (coronary artery disease)    a. 02/2015: DES to mid-LAD  . Cataract   . Geographic tongue   . GERD (gastroesophageal reflux disease)   . Gout   . High cholesterol   . History of prolonged Q-T interval on ECG   . Hx of Clostridium difficile infection   . Hx of umbilical hernia repair   . Hypertension   . Myocardial infarction ([place])   . Personal history of digestive disease    gastric ulcer  . Prostate cancer ([place])   . Prostate enlargement   . Skin cancer    basal cell carcinoma  . Thyroid disease   . Urticaria     Past Surgical History:  Procedure Laterality Date  . APPENDECTOMY    . BACK SURGERY    . CARDIAC CATHETERIZATION N/A 02/21/2015   Procedure: Left Heart Cath;  Surgeon: Lorretta Harp, MD;  Location: [facility];  Service: Cardiovascular;  Laterality: N/A;  . CATARACT EXTRACTION, BILATERAL    . CORONARY ARTERY BYPASS GRAFT N/A 05/24/2018   Procedure: CORONARY ARTERY BYPASS GRAFTING (CABG), ON PUMP, TIMES THREE, USING BILATERAL INTERNAL MAMMARY ARTERY AND HARVESTED LEFT RADIAL ARTERY;  Surgeon: Gaye Pollack, MD;  Location: [facility];  Service: Open Heart Surgery;  Laterality: N/A;  . LEFT HEART CATH AND CORONARY ANGIOGRAPHY N/A 05/22/2018   Procedure: LEFT HEART CATH AND CORONARY ANGIOGRAPHY;  Surgeon: Jettie Booze, MD;  Location: [facility];  Service: Cardiovascular;  Laterality: N/A;  . NASAL SINUS SURGERY    . PROSTATE BIOPSY    . RADIAL ARTERY HARVEST Left 05/24/2018   Procedure: RADIAL ARTERY HARVEST;  Surgeon: Gaye Pollack, MD;  Location: [facility];  Service: Open Heart Surgery;  Laterality: Left;  . right shoulder rotator cuff repair    . TEE WITHOUT CARDIOVERSION N/A 05/24/2018   Procedure: TRANSESOPHAGEAL ECHOCARDIOGRAM (TEE);  Surgeon: Gaye Pollack, MD;  Location: [facility];  Service: Open Heart Surgery;  Laterality: N/A;    Review of systems negative except as noted in  HPI / PMHx or noted below:  Review of Systems  Constitutional: Negative.   HENT: Negative.   Eyes: Negative.   Respiratory: Negative.   Cardiovascular: Negative.   Gastrointestinal: Negative.   Genitourinary: Negative.   Musculoskeletal: Negative.   Skin: Negative.   Neurological: Negative.   Endo/Heme/Allergies: Negative.   Psychiatric/Behavioral: Negative.      Objective:   Vitals:   04/08/19 0853  BP: 102/70  Pulse: 69  Resp: 19  Temp: 97.9 F (36.6 C)  SpO2: 97%   Height: 6\' 1"  (185.4 cm)      Physical Exam Constitutional:      Appearance: He is not diaphoretic.  HENT:     Head: Normocephalic.  Right Ear: Tympanic membrane, ear canal and external ear normal.     Left Ear: Tympanic membrane, ear canal and external ear normal.     Nose: Nose normal. No mucosal edema or rhinorrhea.     Mouth/Throat:     Pharynx: Uvula midline. No oropharyngeal exudate.  Eyes:     Conjunctiva/sclera: Conjunctivae normal.  Neck:     Thyroid: No thyromegaly.     Trachea: Trachea normal. No tracheal tenderness or tracheal deviation.  Cardiovascular:     Rate and Rhythm: Normal rate and regular rhythm.     Heart sounds: Normal heart sounds, S1 normal and S2 normal. No murmur.  Pulmonary:     Effort: No respiratory distress.     Breath sounds: Normal breath sounds. No stridor. No wheezing or rales.  Lymphadenopathy:     Head:     Right side of head: No tonsillar adenopathy.     Left side of head: No tonsillar adenopathy.     Cervical: No cervical adenopathy.  Skin:    Findings: No erythema or rash.     Nails: There is no clubbing.  Neurological:     Mental Status: He is alert.     Diagnostics: none  Assessment and Plan:   1. Hereditary angioedema type 2 (Spr[place])   2. Perennial allergic rhinitis   3. LPRD (laryngopharyngeal reflux disease)   4. Abdominal bloating     1.  Continue Takhzryo every 14 days.   2.  Continue to Treat and prevent inflammation:    A.   Flonase - 1 spray each nostril twice a day  B.  Azelastine - 1 spray each nostril twice a day  3.  Continue to Treat and prevent reflux:   A.  pantoprazole 40 mg twice a day  4.  If needed:   A.  Nasal saline spray  B.  OTC antihistamine - loratadine 10 mg 1 time per day  C.  OTC Mucinex DM - 2 tablets twice a day  D.  Firazyr injection for swelling reaction  5. Return to clinic in 6 months or earlier if problem  6.  Obtain Covid vaccine  Daniel Herrera appears to be doing quite well with his therapy regarding his hereditary angioedema type II and he will continue on Takhzryo and utilize [employer] if required.  He can continue on therapy for his upper airway and therapy for reflux as noted above.  He did mention to me that he has had bloating issues and this may be secondary to his pantoprazole use.  The only way to tell if that is the case is to stop pantoprazole for a day or 2 to see if his bloating gets better.  If that is the case then he will need a substitute for his reflux disease.  I will see him back in his clinic in 6 months or earlier if there is a problem.  Allena Katz, MD Allergy / Immunology [place]

## 2019-04-08 NOTE — Patient Instructions (Addendum)
  1.  Continue Takhzryo every 14 days.   2.  Continue to Treat and prevent inflammation:    A.  Flonase - 1 spray each nostril twice a day  B.  Azelastine - 1 spray each nostril twice a day  3.  Continue to Treat and prevent reflux:   A.  pantoprazole 40 mg twice a day  4.  If needed:   A.  Nasal saline spray  B.  OTC antihistamine - loratadine 10 mg 1 time per day  C.  OTC Mucinex DM - 2 tablets twice a day  D.  Firazyr injection for swelling reaction  5. Return to clinic in 6 months or earlier if problem  6.  Obtain Covid vaccine

## 2019-04-09 ENCOUNTER — Ambulatory Visit (INDEPENDENT_AMBULATORY_CARE_PROVIDER_SITE_OTHER): Payer: Medicare Other | Admitting: Cardiovascular Disease

## 2019-04-09 ENCOUNTER — Encounter: Payer: Self-pay | Admitting: Cardiovascular Disease

## 2019-04-09 ENCOUNTER — Encounter: Payer: Self-pay | Admitting: Allergy and Immunology

## 2019-04-09 VITALS — BP 169/91 | HR 60 | Temp 97.1°F | Ht 73.0 in | Wt 204.6 lb

## 2019-04-09 DIAGNOSIS — E785 Hyperlipidemia, unspecified: Secondary | ICD-10-CM

## 2019-04-09 DIAGNOSIS — I10 Essential (primary) hypertension: Secondary | ICD-10-CM

## 2019-04-09 NOTE — Assessment & Plan Note (Signed)
History of essential hypertension blood pressure measured today at 169/91 although at home he says his blood pressure runs 120/60.  He is on metoprolol.

## 2019-04-09 NOTE — Progress Notes (Signed)
04/09/2019 Daniel Herrera   01-Oct-1940  [id number]  Primary Physician [name], Herrera Primary Cardiologist: Lorretta Harp MD Lupe Carney, [place]  HPI:  Daniel Herrera is a 79 y.o.  married Caucasian male father of 3 children, grandfather of 6 grandchildrenwithout prior heart diseasewho I last spoke to for a virtual telemedicine phone visit 02/24/2019.[name] He doeshave prostate cancerand has undergone radiation therapy followed by Dr. Alinda Money.[name] He's had been having chest pain for a month, worse the day of presentation 02/22/15. He presented to the emergency room where his EKG showed anterolateral T-wave inversion. His troponin level was 0.7. He was given sublingual nitroglycerin with some improvement in his symptoms but because of ongoing chest pain he was decided to bring him to the Cath Lab for urgent angiography and potential intervention. Problems include history of hypertension and hyperlipidemia. I brought him to the cath lab revealing high-grade proximal and mid LAD disease which I stented using 2 drug-eluting stents. He did have moderate first obtuse marginal branch and mid to distal AV groove circumflex disease with an EF of 45-50%. Myoview stress test performed 04/14/15 was nonischemic. He has complained of palpitations and a event monitor showed only PVCs.  He had an admission for unstable angina back in February and underwent cardiac catheterization by Dr. Irish Lack 05/22/2018 revealing three-vessel disease. 2 days later he underwent CABG x3 by Dr. Cyndia Bent with a LIMA to his LAD, left radial to an OM branch and a RIMA to the RCA. His EF was normal. He had some PAF on postop day 1 and was treated with amiodarone. His postop course was otherwise fairly unremarkable. He was recently seen in the ER 06/15/2018 for atypical chest pain and we were consulted. His enzymes were negative and he was discharged home the following day. He says that he feels the best he is felt in years  since his bypass operation. He currently denies chest pain or shortness of breath.  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  His most recent lipid profile performed 03/31/2019 revealed a total cholesterol of 182, LDL of 100 and HDL 47.  He was recently started on Zetia.  We will recheck a lipid liver profile in 1 month.  Apparently his primary provider Daniel Herrera wanted him to have an EKG for unclear reasons although today he is in sinus rhythm  Current Meds  Medication Sig  . aspirin EC 81 MG tablet Take 1 tablet (81 mg total) by mouth daily.  [name] azelastine (ASTELIN) 0.1 % nasal spray Place 1 spray into both nostrils 2 (two) times daily.  . baclofen (LIORESAL) 10 MG tablet Take 0.5-1 tablets (5-10 mg total) by mouth 3 (three) times daily as needed for muscle spasms.  . Cyanocobalamin (VITAMIN B-12) 5000 MCG TBDP Take 1,000 mcg by mouth daily.  [name] EPINEPHRINE 0.3 mg/0.3 mL IJ SOAJ injection INJECT 0.3 ML(0.3MG ) INTO THE MUSCLE AS NEEDED FOR ALLERGIC REACTION  . ezetimibe (ZETIA) 10 MG tablet Take 1 tablet (10 mg total) by mouth daily.  . fluticasone (FLONASE) 50 MCG/ACT nasal spray SHAKE LIQUID AND USE 1 SPRAY IN EACH NOSTRIL DAILY  . icatibant (FIRAZYR) 30 MG/3ML injection   . Levothyroxine Sodium 100 MCG CAPS 100 mcg.   . metoprolol tartrate (LOPRESSOR) 25 MG tablet Take 1 tablet (25 mg total) by mouth 2 (two) times daily.  . montelukast (SINGULAIR) 10 MG tablet Take 1 tablet (10 mg total)  by mouth at bedtime.  . nitroGLYCERIN (NITROSTAT) 0.4 MG SL tablet DISSOLVE 1 TABLET UNDER TONGUE EVERY 5 MINUTES FOR 3 DOSES AS NEEDED CHEST PAIN. IF NO RELIEF CALL 911  . OVER THE COUNTER MEDICATION Take 1-2 capsules by mouth See admin instructions. Health Plus - Super Colon Cleanse 1 capsule in the morning and 2 capsules at night as needed for bowel movement  . pantoprazole (PROTONIX) 40 MG tablet TAKE 1 TABLET(40 MG) BY MOUTH TWICE DAILY  . Pitavastatin Calcium  (LIVALO) 4 MG TABS Take 4 mg by mouth at bedtime.   . prednisoLONE (ORAPRED) 15 MG/5ML solution   . predniSONE (DELTASONE) 10 MG tablet Take as directed for 12 days.  Daily dose 6,6,5,5,4,4,3,3,2,2,1,1.  [name] TAKHZYRO 300 MG/2ML SOLN every 14 (fourteen) days. Injection ever 14 days     Allergies  Allergen Reactions  . Lisinopril Swelling    Facial and lip swelling. And throat swelling.   . Benadryl [Diphenhydramine Hcl] Swelling    Facial swelling from high dose (tolerates Advil PM as needed)  . Darvocet [Propoxyphene N-Acetaminophen] Other (See Comments)    hallucination  . Darvon [Propoxyphene Hcl] Other (See Comments)    Hallucination   . Tylenol [Acetaminophen] Swelling    Foot swelling    Social History   Socioeconomic History  . Marital status: Married    Spouse name: Not on file  . Number of children: Not on file  . Years of education: Not on file  . Highest education level: Not on file  Occupational History  . Occupation: retired  Tobacco Use  . Smoking status: Never Smoker  . Smokeless tobacco: Never Used  Substance and Sexual Activity  . Alcohol use: Never  . Drug use: No  . Sexual activity: Not on file  Other Topics Concern  . Not on file  Social History Narrative  . Not on file   Social Determinants of Health   Financial Resource Strain:   . Difficulty of Paying Living Expenses: Not on file  Food Insecurity:   . Worried About Charity fundraiser in the Last Year: Not on file  . Ran Out of Food in the Last Year: Not on file  Transportation Needs:   . Lack of Transportation (Medical): Not on file  . Lack of Transportation (Non-Medical): Not on file  Physical Activity:   . Days of Exercise per Week: Not on file  . Minutes of Exercise per Session: Not on file  Stress:   . Feeling of Stress : Not on file  Social Connections:   . Frequency of Communication with Friends and Family: Not on file  . Frequency of Social Gatherings with Friends and Family: Not  on file  . Attends Religious Services: Not on file  . Active Member of Clubs or Organizations: Not on file  . Attends Archivist Meetings: Not on file  . Marital Status: Not on file  Intimate Partner Violence:   . Fear of Current or Ex-Partner: Not on file  . Emotionally Abused: Not on file  . Physically Abused: Not on file  . Sexually Abused: Not on file     Review of Systems: General: negative for chills, fever, night sweats or weight changes.  Cardiovascular: negative for chest pain, dyspnea on exertion, edema, orthopnea, palpitations, paroxysmal nocturnal dyspnea or shortness of breath Dermatological: negative for rash Respiratory: negative for cough or wheezing Urologic: negative for hematuria Abdominal: negative for nausea, vomiting, diarrhea, bright red blood per rectum, melena, or  hematemesis Neurologic: negative for visual changes, syncope, or dizziness All other systems reviewed and are otherwise negative except as noted above.    Blood pressure (!) 169/91, pulse 60, temperature (!) 97.1 F (36.2 C), height 6\' 1"  (1.854 m), weight 204 lb 9.6 oz (92.8 kg), SpO2 99 %.  General appearance: alert and no distress Neck: no adenopathy, no carotid bruit, no JVD, supple, symmetrical, trachea midline and thyroid not enlarged, symmetric, no tenderness/mass/nodules Lungs: clear to auscultation bilaterally Heart: regular rate and rhythm, S1, S2 normal, no murmur, click, rub or gallop Extremities: extremities normal, atraumatic, no cyanosis or edema Pulses: 2+ and symmetric Skin: Skin color, texture, turgor normal. No rashes or lesions Neurologic: Alert and oriented X 3, normal strength and tone. Normal symmetric reflexes. Normal coordination and gait  EKG sinus rhythm at 60 with voltage evidence of LVH.  I personally reviewed this EKG.  ASSESSMENT AND PLAN:   Essential hypertension History of essential hypertension blood pressure measured today at 169/91 although at  home he says his blood pressure runs 120/60.  He is on metoprolol.  Dyslipidemia, goal LDL below 70 History of dyslipidemia on Livalo 4 mg.  I added Zetia when I saw him 6 weeks ago.  I have asked him to have his PCP recheck a lipid liver profile in 1 month.      Lorretta Harp MD FACP,FACC,FAHA, [facility] 04/09/2019 10:24 AM

## 2019-04-09 NOTE — Patient Instructions (Signed)
Medication Instructions:  Your physician recommends that you continue on your current medications as directed. Please refer to the Current Medication list given to you today.  If you need a refill on your cardiac medications before your next appointment, please call your pharmacy.   Lab work: NONE  Testing/Procedures: NONE  Follow-Up: At [facility], you and your health needs are our priority.  As part of our continuing mission to provide you with exceptional heart care, we have created designated Provider Care Teams.  These Care Teams include your primary Cardiologist (physician) and Advanced Practice Providers (APPs -  Physician Assistants and Nurse Practitioners) who all work together to provide you with the care you need, when you need it. You may see Quay Burow, MD  or one of the following Advanced Practice Providers on your designated Care Team:    Kerin Ransom, PA-C  [place], [place]  Coletta Memos, [place]  Your physician wants you to follow-up in: 6 months with a Physicians Assistant Your physician wants you to follow-up in: 1 year with Dr Gwenlyn Found

## 2019-04-09 NOTE — Assessment & Plan Note (Signed)
History of dyslipidemia on Livalo 4 mg.  I added Zetia when I saw him 6 weeks ago.  I have asked him to have his PCP recheck a lipid liver profile in 1 month.

## 2019-04-14 ENCOUNTER — Other Ambulatory Visit: Payer: Self-pay

## 2019-04-14 ENCOUNTER — Ambulatory Visit (INDEPENDENT_AMBULATORY_CARE_PROVIDER_SITE_OTHER): Payer: Medicare Other | Admitting: Physical Medicine and Rehabilitation

## 2019-04-14 ENCOUNTER — Ambulatory Visit: Payer: Self-pay

## 2019-04-14 ENCOUNTER — Encounter: Payer: Self-pay | Admitting: Physical Medicine and Rehabilitation

## 2019-04-14 VITALS — BP 148/84 | HR 65

## 2019-04-14 DIAGNOSIS — M5416 Radiculopathy, lumbar region: Secondary | ICD-10-CM | POA: Diagnosis not present

## 2019-04-14 MED ORDER — METHYLPREDNISOLONE ACETATE 80 MG/ML IJ SUSP
40.0000 mg | Freq: Once | INTRAMUSCULAR | Status: AC
  Administered 2019-04-14: 10:00:00 40 mg

## 2019-04-14 NOTE — Progress Notes (Signed)
 .  Numeric Pain Rating Scale and Functional Assessment Average Pain 8   In the last MONTH (on 0-10 scale) has pain interfered with the following?  1. General activity like being  able to carry out your everyday physical activities such as walking, climbing stairs, carrying groceries, or moving a chair?  Rating(8)   +Driver, -BT, -Dye Allergies.  

## 2019-04-14 NOTE — Procedures (Signed)
S1 Lumbosacral Transforaminal Epidural Steroid Injection - Sub-Pedicular Approach with Fluoroscopic Guidance   Patient: Daniel Herrera      Date of Birth: 1940-05-28 MRN: [id number] PCP: [name], NP      Visit Date: 04/14/2019   Universal Protocol:    Date/Time: 04/13/2109:58 AM  Consent Given By: the patient  Position:  PRONE  Additional Comments: Vital signs were monitored before and after the procedure. Patient was prepped and draped in the usual sterile fashion. The correct patient, procedure, and site was verified.   Injection Procedure Details:  Procedure Site One Meds Administered:  Meds ordered this encounter  Medications  . methylPREDNISolone acetate (DEPO-MEDROL) injection 40 mg    Laterality: Right  Location/Site:  S1 Foramen   Needle size: 22 ga.  Needle type: Spinal  Needle Placement: Transforaminal  Findings:   -Comments: Excellent flow of contrast along the nerve and into the epidural space.  Epidurogram: Contrast epidurogram showed no nerve root cut off or restricted flow pattern.  Procedure Details: After squaring off the sacral end-plate to get a true AP view, the C-arm was positioned so that the best possible view of the S1 foramen was visualized. The soft tissues overlying this structure were infiltrated with 2-3 ml. of 1% Lidocaine without Epinephrine.    The spinal needle was inserted toward the target using a "trajectory" view along the fluoroscope beam.  Under AP and lateral visualization, the needle was advanced so it did not puncture dura. Biplanar projections were used to confirm position. Aspiration was confirmed to be negative for CSF and/or blood. A 1-2 ml. volume of Isovue-250 was injected and flow of contrast was noted at each level. Radiographs were obtained for documentation purposes.   After attaining the desired flow of contrast documented above, a 0.5 to 1.0 ml test dose of 0.25% Marcaine was injected into each respective  transforaminal space.  The patient was observed for 90 seconds post injection.  After no sensory deficits were reported, and normal lower extremity motor function was noted,   the above injectate was administered so that equal amounts of the injectate were placed at each foramen (level) into the transforaminal epidural space.   Additional Comments:  The patient tolerated the procedure well Dressing: Band-Aid with 2 x 2 sterile gauze    Post-procedure details: Patient was observed during the procedure. Post-procedure instructions were reviewed.  Patient left the clinic in stable condition.

## 2019-04-14 NOTE — Progress Notes (Signed)
EULAN BOURQUIN - 79 y.o. male MRN [id number]  Date of birth: [birth year]  Office Visit Note: Visit Date: 04/14/2019 PCP: [name], NP Referred by: [name], NP  Subjective: Chief Complaint  Patient presents with  . Lower Back - Pain  . Right Leg - Pain   HPI: YONG ZAHORIK is a 79 y.o. male who comes in today For interventional spine procedure as requested by Dr. Eunice Blase.  Patient complains of right radicular leg pain and a pretty classic S1 distribution.  He has had fairly abrupt onset recently of severe nerve type pain in the leg.  He is failed conservative care with medication management and exercise and time.  He has had physical therapy.  He has a history of remote lumbar spine surgery over 20 years ago x3.  These sound like they were discectomies.  He does not remember what level.  Do to the severity of symptoms are going to complete a diagnostic S1 transforaminal injection today with epidurography.  Depending on relief if it was just not lasting very long or not beneficial patient would need MRI of the lumbar spine versus CT myelogram.  ROS Otherwise per HPI.  Assessment & Plan: Visit Diagnoses:  1. Lumbar radiculopathy     Plan: No additional findings.   Meds & Orders:  Meds ordered this encounter  Medications  . methylPREDNISolone acetate (DEPO-MEDROL) injection 40 mg    Orders Placed This Encounter  Procedures  . XR C-ARM NO REPORT  . Epidural Steroid injection    Follow-up: Return for visit to requesting physician as needed.   Procedures: No procedures performed  S1 Lumbosacral Transforaminal Epidural Steroid Injection - Sub-Pedicular Approach with Fluoroscopic Guidance   Patient: DEMARIEN SERPE      Date of Birth: 08-29-1940 MRN: [id number] PCP: [name], NP      Visit Date: 04/14/2019   Universal Protocol:    Date/Time: 04/13/2109:58 AM  Consent Given By: the patient  Position:  PRONE  Additional Comments: Vital signs were  monitored before and after the procedure. Patient was prepped and draped in the usual sterile fashion. The correct patient, procedure, and site was verified.   Injection Procedure Details:  Procedure Site One Meds Administered:  Meds ordered this encounter  Medications  . methylPREDNISolone acetate (DEPO-MEDROL) injection 40 mg    Laterality: Right  Location/Site:  S1 Foramen   Needle size: 22 ga.  Needle type: Spinal  Needle Placement: Transforaminal  Findings:   -Comments: Excellent flow of contrast along the nerve and into the epidural space.  Epidurogram: Contrast epidurogram showed no nerve root cut off or restricted flow pattern.  Procedure Details: After squaring off the sacral end-plate to get a true AP view, the C-arm was positioned so that the best possible view of the S1 foramen was visualized. The soft tissues overlying this structure were infiltrated with 2-3 ml. of 1% Lidocaine without Epinephrine.    The spinal needle was inserted toward the target using a "trajectory" view along the fluoroscope beam.  Under AP and lateral visualization, the needle was advanced so it did not puncture dura. Biplanar projections were used to confirm position. Aspiration was confirmed to be negative for CSF and/or blood. A 1-2 ml. volume of Isovue-250 was injected and flow of contrast was noted at each level. Radiographs were obtained for documentation purposes.   After attaining the desired flow of contrast documented above, a 0.5 to 1.0 ml test dose of 0.25% Marcaine  was injected into each respective transforaminal space.  The patient was observed for 90 seconds post injection.  After no sensory deficits were reported, and normal lower extremity motor function was noted,   the above injectate was administered so that equal amounts of the injectate were placed at each foramen (level) into the transforaminal epidural space.   Additional Comments:  The patient tolerated the  procedure well Dressing: Band-Aid with 2 x 2 sterile gauze    Post-procedure details: Patient was observed during the procedure. Post-procedure instructions were reviewed.  Patient left the clinic in stable condition.     Clinical History: No specialty comments available.   He reports that he has never smoked. He has never used smokeless tobacco.  Recent Labs    05/22/18 0950  HGBA1C 6.5*    Objective:  VS:  HT:    WT:   BMI:     BP:(!) 148/84  HR:65bpm  TEMP: ( )  RESP:  Physical Exam  Ortho Exam Imaging: No results found.  Past Medical/Family/Surgical/Social History: Medications & Allergies reviewed per EMR, new medications updated. Patient Active Problem List   Diagnosis Date Noted  . PAF (paroxysmal atrial fibrillation) ([place]) 06/13/2018  . S/P CABG x 3 05/28/2018  . NSTEMI (non-ST elevated myocardial infarction) ([place]) 05/21/2018  . Tachycardia determined by examination of pulse   . Pre-operative clearance 01/02/2018  . Hematochezia 12/21/2017  . CKD (chronic kidney disease) stage 3, GFR 30-59 ml/min 12/21/2017  . GI bleed 12/21/2017  . Hereditary angioedema type 2 ([place]) 07/28/2017  . Enterotoxigenic Escherichia coli infection 05/24/2015  . STEC (Shiga toxin-producing Escherichia coli) infection 05/24/2015  . Colitis 05/23/2015  . History of prostate cancer   . Chronic diarrhea of unknown origin   . CAD S/P percutaneous coronary angioplasty   . Type 2 diabetes mellitus with unspecified complications ([place])   . Chest pain 04/08/2015  . Dyspnea 04/08/2015  . Palpitations 03/15/2015  . History of non-ST elevation myocardial infarction (NSTEMI) 02/22/2015  . Abnormal EKG 02/21/2015  . Essential hypertension 02/21/2015  . Dyslipidemia, goal LDL below 70 02/21/2015  . Malignant neoplasm of prostate ([place]) 08/07/2014  . Acute interstitial pneumonitis ([place]) 12/17/2013   Past Medical History:  Diagnosis Date  . Angio-edema   . Anxiety   . Arthritis   . CAD  (coronary artery disease)    a. 02/2015: DES to mid-LAD  . Cataract   . Geographic tongue   . GERD (gastroesophageal reflux disease)   . Gout   . High cholesterol   . History of prolonged Q-T interval on ECG   . Hx of Clostridium difficile infection   . Hx of umbilical hernia repair   . Hypertension   . Myocardial infarction ([place])   . Personal history of digestive disease    gastric ulcer  . Prostate cancer ([place])   . Prostate enlargement   . Skin cancer    basal cell carcinoma  . Thyroid disease   . Urticaria    Family History  Problem Relation Age of Onset  . Cancer Mother   . Stomach cancer Mother   . Cancer Father        prostate  . Cancer Sister    Past Surgical History:  Procedure Laterality Date  . APPENDECTOMY    . BACK SURGERY    . CARDIAC CATHETERIZATION N/A 02/21/2015   Procedure: Left Heart Cath;  Surgeon: Lorretta Harp, MD;  Location: [facility];  Service: Cardiovascular;  Laterality: N/A;  .  CATARACT EXTRACTION, BILATERAL    . CORONARY ARTERY BYPASS GRAFT N/A 05/24/2018   Procedure: CORONARY ARTERY BYPASS GRAFTING (CABG), ON PUMP, TIMES THREE, USING BILATERAL INTERNAL MAMMARY ARTERY AND HARVESTED LEFT RADIAL ARTERY;  Surgeon: Gaye Pollack, MD;  Location: [facility];  Service: Open Heart Surgery;  Laterality: N/A;  . LEFT HEART CATH AND CORONARY ANGIOGRAPHY N/A 05/22/2018   Procedure: LEFT HEART CATH AND CORONARY ANGIOGRAPHY;  Surgeon: Jettie Booze, MD;  Location: [facility];  Service: Cardiovascular;  Laterality: N/A;  . NASAL SINUS SURGERY    . PROSTATE BIOPSY    . RADIAL ARTERY HARVEST Left 05/24/2018   Procedure: RADIAL ARTERY HARVEST;  Surgeon: Gaye Pollack, MD;  Location: [facility];  Service: Open Heart Surgery;  Laterality: Left;  . right shoulder rotator cuff repair    . TEE WITHOUT CARDIOVERSION N/A 05/24/2018   Procedure: TRANSESOPHAGEAL ECHOCARDIOGRAM (TEE);  Surgeon: Gaye Pollack, MD;  Location: [facility];  Service: Open Heart  Surgery;  Laterality: N/A;   Social History   Occupational History  . Occupation: retired  Tobacco Use  . Smoking status: Never Smoker  . Smokeless tobacco: Never Used  Substance and Sexual Activity  . Alcohol use: Never  . Drug use: No  . Sexual activity: Not on file

## 2019-05-19 ENCOUNTER — Other Ambulatory Visit: Payer: Self-pay

## 2019-05-19 ENCOUNTER — Ambulatory Visit
Admission: RE | Admit: 2019-05-19 | Discharge: 2019-05-19 | Disposition: A | Discharge status: Home / Self Care | Payer: Medicare Other | Source: Ambulatory Visit | Attending: Pulmonary Disease | Admitting: Pulmonary Disease

## 2019-05-19 DIAGNOSIS — J849 Interstitial pulmonary disease, unspecified: Secondary | ICD-10-CM

## 2019-05-19 DIAGNOSIS — R918 Other nonspecific abnormal finding of lung field: Secondary | ICD-10-CM | POA: Diagnosis not present

## 2019-05-23 ENCOUNTER — Other Ambulatory Visit: Payer: Self-pay | Admitting: Cardiovascular Disease

## 2019-05-23 DIAGNOSIS — E785 Hyperlipidemia, unspecified: Secondary | ICD-10-CM | POA: Diagnosis not present

## 2019-05-23 DIAGNOSIS — I10 Essential (primary) hypertension: Secondary | ICD-10-CM | POA: Diagnosis not present

## 2019-05-24 LAB — HEPATIC FUNCTION PANEL
ALT: 19 IU/L (ref 0–44)
AST: 22 IU/L (ref 0–40)
Albumin: 4.6 g/dL (ref 3.7–4.7)
Alkaline Phosphatase: 93 IU/L (ref 39–117)
Bilirubin Total: 0.3 mg/dL (ref 0.0–1.2)
Bilirubin, Direct: 0.1 mg/dL (ref 0.00–0.40)
Total Protein: 6.5 g/dL (ref 6.0–8.5)

## 2019-05-24 LAB — LIPID PANEL
Chol/HDL Ratio: 4.2 ratio (ref 0.0–5.0)
Cholesterol, Total: 183 mg/dL (ref 100–199)
HDL: 44 mg/dL (ref >39)
LDL Chol Calc (NIH): 108 mg/dL — ABNORMAL HIGH (ref 0–99)
Triglycerides: 179 mg/dL — ABNORMAL HIGH (ref 0–149)
VLDL Cholesterol Cal: 31 mg/dL (ref 5–40)

## 2019-05-26 DIAGNOSIS — C61 Malignant neoplasm of prostate: Secondary | ICD-10-CM | POA: Diagnosis not present

## 2019-05-27 ENCOUNTER — Other Ambulatory Visit: Payer: Self-pay

## 2019-05-27 ENCOUNTER — Encounter (HOSPITAL_COMMUNITY): Payer: Self-pay

## 2019-05-27 ENCOUNTER — Ambulatory Visit (INDEPENDENT_AMBULATORY_CARE_PROVIDER_SITE_OTHER): Payer: Medicare Other | Admitting: Pulmonary Disease

## 2019-05-27 ENCOUNTER — Encounter: Payer: Self-pay | Admitting: Pulmonary Disease

## 2019-05-27 ENCOUNTER — Inpatient Hospital Stay (HOSPITAL_COMMUNITY)
Admission: EM | Admit: 2019-05-27 | Discharge: 2019-05-29 | DRG: 378 | Disposition: A | Discharge status: Home / Self Care | Payer: Medicare Other | Attending: Internal Medicine | Admitting: Internal Medicine

## 2019-05-27 DIAGNOSIS — I129 Hypertensive chronic kidney disease with stage 1 through stage 4 chronic kidney disease, or unspecified chronic kidney disease: Secondary | ICD-10-CM | POA: Diagnosis present

## 2019-05-27 DIAGNOSIS — J849 Interstitial pulmonary disease, unspecified: Secondary | ICD-10-CM | POA: Diagnosis present

## 2019-05-27 DIAGNOSIS — I10 Essential (primary) hypertension: Secondary | ICD-10-CM | POA: Diagnosis present

## 2019-05-27 DIAGNOSIS — K5731 Diverticulosis of large intestine without perforation or abscess with bleeding: Secondary | ICD-10-CM | POA: Diagnosis not present

## 2019-05-27 DIAGNOSIS — E7800 Pure hypercholesterolemia, unspecified: Secondary | ICD-10-CM | POA: Diagnosis present

## 2019-05-27 DIAGNOSIS — K439 Ventral hernia without obstruction or gangrene: Secondary | ICD-10-CM | POA: Diagnosis present

## 2019-05-27 DIAGNOSIS — Z86010 Personal history of colonic polyps: Secondary | ICD-10-CM

## 2019-05-27 DIAGNOSIS — K921 Melena: Secondary | ICD-10-CM | POA: Diagnosis present

## 2019-05-27 DIAGNOSIS — Z20822 Contact with and (suspected) exposure to covid-19: Secondary | ICD-10-CM | POA: Diagnosis present

## 2019-05-27 DIAGNOSIS — F419 Anxiety disorder, unspecified: Secondary | ICD-10-CM | POA: Diagnosis present

## 2019-05-27 DIAGNOSIS — Z85828 Personal history of other malignant neoplasm of skin: Secondary | ICD-10-CM

## 2019-05-27 DIAGNOSIS — N183 Chronic kidney disease, stage 3 unspecified: Secondary | ICD-10-CM | POA: Diagnosis present

## 2019-05-27 DIAGNOSIS — N1832 Chronic kidney disease, stage 3b: Secondary | ICD-10-CM | POA: Diagnosis present

## 2019-05-27 DIAGNOSIS — M109 Gout, unspecified: Secondary | ICD-10-CM | POA: Diagnosis present

## 2019-05-27 DIAGNOSIS — I2510 Atherosclerotic heart disease of native coronary artery without angina pectoris: Secondary | ICD-10-CM

## 2019-05-27 DIAGNOSIS — K219 Gastro-esophageal reflux disease without esophagitis: Secondary | ICD-10-CM | POA: Diagnosis present

## 2019-05-27 DIAGNOSIS — D500 Iron deficiency anemia secondary to blood loss (chronic): Secondary | ICD-10-CM | POA: Diagnosis present

## 2019-05-27 DIAGNOSIS — D841 Defects in the complement system: Secondary | ICD-10-CM | POA: Diagnosis present

## 2019-05-27 DIAGNOSIS — Z7982 Long term (current) use of aspirin: Secondary | ICD-10-CM

## 2019-05-27 DIAGNOSIS — Z87442 Personal history of urinary calculi: Secondary | ICD-10-CM

## 2019-05-27 DIAGNOSIS — Z888 Allergy status to other drugs, medicaments and biological substances status: Secondary | ICD-10-CM

## 2019-05-27 DIAGNOSIS — R002 Palpitations: Secondary | ICD-10-CM

## 2019-05-27 DIAGNOSIS — K922 Gastrointestinal hemorrhage, unspecified: Secondary | ICD-10-CM | POA: Diagnosis not present

## 2019-05-27 DIAGNOSIS — Z951 Presence of aortocoronary bypass graft: Secondary | ICD-10-CM

## 2019-05-27 DIAGNOSIS — Z8546 Personal history of malignant neoplasm of prostate: Secondary | ICD-10-CM

## 2019-05-27 DIAGNOSIS — H9190 Unspecified hearing loss, unspecified ear: Secondary | ICD-10-CM | POA: Diagnosis present

## 2019-05-27 DIAGNOSIS — I252 Old myocardial infarction: Secondary | ICD-10-CM

## 2019-05-27 DIAGNOSIS — Z885 Allergy status to narcotic agent status: Secondary | ICD-10-CM

## 2019-05-27 DIAGNOSIS — Z923 Personal history of irradiation: Secondary | ICD-10-CM

## 2019-05-27 DIAGNOSIS — Z79899 Other long term (current) drug therapy: Secondary | ICD-10-CM

## 2019-05-27 DIAGNOSIS — E039 Hypothyroidism, unspecified: Secondary | ICD-10-CM | POA: Diagnosis present

## 2019-05-27 DIAGNOSIS — K5790 Diverticulosis of intestine, part unspecified, without perforation or abscess without bleeding: Secondary | ICD-10-CM

## 2019-05-27 LAB — CBC
HCT: 41.1 % (ref 39.0–52.0)
Hemoglobin: 13.4 g/dL (ref 13.0–17.0)
MCH: 30.2 pg (ref 26.0–34.0)
MCHC: 32.6 g/dL (ref 30.0–36.0)
MCV: 92.8 fL (ref 80.0–100.0)
Platelets: 187 10*3/uL (ref 150–400)
RBC: 4.43 MIL/uL (ref 4.22–5.81)
RDW: 13.6 % (ref 11.5–15.5)
WBC: 6.4 10*3/uL (ref 4.0–10.5)
nRBC: 0 % (ref 0.0–0.2)

## 2019-05-27 LAB — COMPREHENSIVE METABOLIC PANEL
ALT: 23 U/L (ref 0–44)
AST: 22 U/L (ref 15–41)
Albumin: 3.8 g/dL (ref 3.5–5.0)
Alkaline Phosphatase: 59 U/L (ref 38–126)
Anion gap: 10 (ref 5–15)
BUN: 21 mg/dL (ref 8–23)
CO2: 22 mmol/L (ref 22–32)
Calcium: 9.2 mg/dL (ref 8.9–10.3)
Chloride: 108 mmol/L (ref 98–111)
Creatinine, Ser: 1.62 mg/dL — ABNORMAL HIGH (ref 0.61–1.24)
GFR calc Af Amer: 46 mL/min — ABNORMAL LOW (ref >60)
GFR calc non Af Amer: 39 mL/min — ABNORMAL LOW (ref >60)
Glucose, Bld: 161 mg/dL — ABNORMAL HIGH (ref 70–99)
Potassium: 4.1 mmol/L (ref 3.5–5.1)
Sodium: 140 mmol/L (ref 135–145)
Total Bilirubin: 1 mg/dL (ref 0.3–1.2)
Total Protein: 6 g/dL — ABNORMAL LOW (ref 6.5–8.1)

## 2019-05-27 LAB — TYPE AND SCREEN
ABO/RH(D): O NEG
Antibody Screen: NEGATIVE

## 2019-05-27 NOTE — Assessment & Plan Note (Addendum)
Previously attributed to acute interstitial pneumonitis but based on HRCT this is likely IPF, "probable UIP" Lung function seems to be relatively stable He would certainly not tolerate GI side effects of antifibrotic's given his GI issues already   Schedule PFTs next visit Ambulatory saturation today. We will monitor him more closely for deterioration   Medications that we can consider for this condition include pirfenidone and O FEV Side effects include nausea, vomiting and liver toxicity ExTensive discussion regarding these medications

## 2019-05-27 NOTE — ED Triage Notes (Signed)
Pt came in POV c/o of rectal bleeding. Pt stated "I went to the bathroom around 1700 and noticed blood and b,lood clots in the toilet, then I went back around 2000 and had another episode of blood and blood clots in the toilet". Pt states he has been seen before for rectal bleeding but denies any blood transfusions.

## 2019-05-27 NOTE — Patient Instructions (Signed)
We discussed that you may have early stage pulmonary fibrosis  Schedule PFTs next visit Ambulatory saturation today.   Medications that we can consider for this condition include pirfenidone and O FEV Side effects include nausea, vomiting and liver toxicity

## 2019-05-27 NOTE — Progress Notes (Signed)
   Subjective:    Patient ID: Daniel Herrera, male    DOB: 1940-11-02, 79 y.o.   MRN: [id number]  HPI  79 yo never smoker for FU of interstitial pneumonia  Past medical history-includesprostate CA,C diff colitis and SVTs that stop with vagal maneuvers,  angioedema, CABG 06/2018  He presented in 2015 with dyspnea for a few months,PFTs were normal except for mildly decreased DLCO.CT chest at [facility] showed mild interstitial prominence. This was attributed to check chicken farm exposure or GERD related pneumonitis  We reviewed prior history and course and current CT HRCT chest 05/2019 -probable UIP, no definite honeycombing  Also reviewed prior PFTs from 2015 and 2020  Medication review including those for angioedema He reports chronic hoarseness for many years, ENT evaluation has been negative in the past  He and his wife have received both vaccines against Covid  Past Medical History:  Diagnosis Date  . Angio-edema   . Anxiety   . Arthritis   . CAD (coronary artery disease)    a. 02/2015: DES to mid-LAD  . Cataract   . Geographic tongue   . GERD (gastroesophageal reflux disease)   . Gout   . High cholesterol   . History of prolonged Q-T interval on ECG   . Hx of Clostridium difficile infection   . Hx of umbilical hernia repair   . Hypertension   . Myocardial infarction ([place])   . Personal history of digestive disease    gastric ulcer  . Prostate cancer ([place])   . Prostate enlargement   . Skin cancer    basal cell carcinoma  . Thyroid disease   . Urticaria      Significant tests/ events reviewed  CT chest without contrast 11/2013 showed interstitial prominence particularly apical with mild bronchiectasis, small patchy infiltrate was noted in the right lower lobe with a tiny effusion. ESR ,CCP neg PFTs 02/2014 - nml FEV1/ FVC & ratio, TLC 81%, DLCO 65%  PFTs 05/2018 no airway obstruction, FVC 4.07/88%, DLCO 15.6/58%  CT abdomen from 05/2015 showed   dependent changes in both lung bases and mild bronchiectasis.   Review of Systems neg for any significant sore throat, dysphagia, itching, sneezing, nasal congestion or excess/ purulent secretions, fever, chills, sweats, unintended wt loss, pleuritic or exertional cp, hempoptysis, orthopnea pnd or change in chronic leg swelling. Also denies presyncope, palpitations, heartburn, abdominal pain, nausea, vomiting, diarrhea or change in bowel or urinary habits, dysuria,hematuria, rash, arthralgias, visual complaints, headache, numbness weakness or ataxia.     Objective:   Physical Exam   Gen. Pleasant, well-nourished, in no distress, normal affect ENT - no pallor,icterus, no post nasal drip Neck: No JVD, no thyromegaly, no carotid bruits Lungs: no use of accessory muscles, no dullness to percussion, LT basal crackles  Cardiovascular: Rhythm regular, heart sounds  normal, no murmurs or gallops, no peripheral edema Abdomen: soft and non-tender, no hepatosplenomegaly, BS normal. Musculoskeletal: No deformities, no cyanosis or clubbing Neuro:  alert, non focal        Assessment & Plan:

## 2019-05-28 DIAGNOSIS — E7800 Pure hypercholesterolemia, unspecified: Secondary | ICD-10-CM | POA: Diagnosis present

## 2019-05-28 DIAGNOSIS — Z885 Allergy status to narcotic agent status: Secondary | ICD-10-CM | POA: Diagnosis not present

## 2019-05-28 DIAGNOSIS — I252 Old myocardial infarction: Secondary | ICD-10-CM | POA: Diagnosis not present

## 2019-05-28 DIAGNOSIS — Z8546 Personal history of malignant neoplasm of prostate: Secondary | ICD-10-CM | POA: Diagnosis not present

## 2019-05-28 DIAGNOSIS — D841 Defects in the complement system: Secondary | ICD-10-CM | POA: Diagnosis present

## 2019-05-28 DIAGNOSIS — K5790 Diverticulosis of intestine, part unspecified, without perforation or abscess without bleeding: Secondary | ICD-10-CM | POA: Diagnosis not present

## 2019-05-28 DIAGNOSIS — I129 Hypertensive chronic kidney disease with stage 1 through stage 4 chronic kidney disease, or unspecified chronic kidney disease: Secondary | ICD-10-CM | POA: Diagnosis present

## 2019-05-28 DIAGNOSIS — Z7982 Long term (current) use of aspirin: Secondary | ICD-10-CM | POA: Diagnosis not present

## 2019-05-28 DIAGNOSIS — K219 Gastro-esophageal reflux disease without esophagitis: Secondary | ICD-10-CM | POA: Diagnosis present

## 2019-05-28 DIAGNOSIS — E039 Hypothyroidism, unspecified: Secondary | ICD-10-CM | POA: Diagnosis present

## 2019-05-28 DIAGNOSIS — K5731 Diverticulosis of large intestine without perforation or abscess with bleeding: Secondary | ICD-10-CM | POA: Diagnosis present

## 2019-05-28 DIAGNOSIS — Z79899 Other long term (current) drug therapy: Secondary | ICD-10-CM | POA: Diagnosis not present

## 2019-05-28 DIAGNOSIS — K922 Gastrointestinal hemorrhage, unspecified: Secondary | ICD-10-CM

## 2019-05-28 DIAGNOSIS — K439 Ventral hernia without obstruction or gangrene: Secondary | ICD-10-CM | POA: Diagnosis present

## 2019-05-28 DIAGNOSIS — H9190 Unspecified hearing loss, unspecified ear: Secondary | ICD-10-CM | POA: Diagnosis present

## 2019-05-28 DIAGNOSIS — M109 Gout, unspecified: Secondary | ICD-10-CM | POA: Diagnosis present

## 2019-05-28 DIAGNOSIS — I2510 Atherosclerotic heart disease of native coronary artery without angina pectoris: Secondary | ICD-10-CM

## 2019-05-28 DIAGNOSIS — K921 Melena: Secondary | ICD-10-CM

## 2019-05-28 DIAGNOSIS — D500 Iron deficiency anemia secondary to blood loss (chronic): Secondary | ICD-10-CM | POA: Diagnosis present

## 2019-05-28 DIAGNOSIS — Z951 Presence of aortocoronary bypass graft: Secondary | ICD-10-CM | POA: Diagnosis not present

## 2019-05-28 DIAGNOSIS — Z888 Allergy status to other drugs, medicaments and biological substances status: Secondary | ICD-10-CM | POA: Diagnosis not present

## 2019-05-28 DIAGNOSIS — F419 Anxiety disorder, unspecified: Secondary | ICD-10-CM | POA: Diagnosis present

## 2019-05-28 DIAGNOSIS — Z85828 Personal history of other malignant neoplasm of skin: Secondary | ICD-10-CM | POA: Diagnosis not present

## 2019-05-28 DIAGNOSIS — N1832 Chronic kidney disease, stage 3b: Secondary | ICD-10-CM | POA: Diagnosis present

## 2019-05-28 DIAGNOSIS — Z86010 Personal history of colonic polyps: Secondary | ICD-10-CM | POA: Diagnosis not present

## 2019-05-28 DIAGNOSIS — J849 Interstitial pulmonary disease, unspecified: Secondary | ICD-10-CM | POA: Diagnosis present

## 2019-05-28 DIAGNOSIS — Z20822 Contact with and (suspected) exposure to covid-19: Secondary | ICD-10-CM | POA: Diagnosis present

## 2019-05-28 HISTORY — DX: Atherosclerotic heart disease of native coronary artery without angina pectoris: I25.10

## 2019-05-28 HISTORY — DX: Gastrointestinal hemorrhage, unspecified: K92.2

## 2019-05-28 LAB — CBC
HCT: 34.3 % — ABNORMAL LOW (ref 39.0–52.0)
Hemoglobin: 11.8 g/dL — ABNORMAL LOW (ref 13.0–17.0)
MCH: 31.1 pg (ref 26.0–34.0)
MCHC: 34.4 g/dL (ref 30.0–36.0)
MCV: 90.5 fL (ref 80.0–100.0)
Platelets: 144 10*3/uL — ABNORMAL LOW (ref 150–400)
RBC: 3.79 MIL/uL — ABNORMAL LOW (ref 4.22–5.81)
RDW: 13.7 % (ref 11.5–15.5)
WBC: 3.7 10*3/uL — ABNORMAL LOW (ref 4.0–10.5)
nRBC: 0 % (ref 0.0–0.2)

## 2019-05-28 LAB — HEMOGLOBIN AND HEMATOCRIT, BLOOD
HCT: 36.6 % — ABNORMAL LOW (ref 39.0–52.0)
HCT: 36.4 % — ABNORMAL LOW (ref 39.0–52.0)
Hemoglobin: 12.2 g/dL — ABNORMAL LOW (ref 13.0–17.0)
Hemoglobin: 12.1 g/dL — ABNORMAL LOW (ref 13.0–17.0)

## 2019-05-28 LAB — SARS CORONAVIRUS 2 (TAT 6-24 HRS): SARS Coronavirus 2: NEGATIVE

## 2019-05-28 MED ORDER — LEVOTHYROXINE SODIUM 88 MCG PO TABS
88.0000 ug | ORAL_TABLET | Freq: Every day | ORAL | Status: DC
  Administered 2019-05-28 – 2019-05-29 (×2): 88 ug via ORAL
  Filled 2019-05-28 (×2): qty 1

## 2019-05-28 MED ORDER — BACLOFEN 10 MG PO TABS: 5.0000 mg | ORAL_TABLET | Freq: Three times a day (TID) | ORAL | Status: DC | PRN

## 2019-05-28 MED ORDER — EZETIMIBE 10 MG PO TABS
10.0000 mg | ORAL_TABLET | Freq: Every day | ORAL | Status: DC
  Administered 2019-05-28 – 2019-05-29 (×2): 10 mg via ORAL
  Filled 2019-05-28 (×2): qty 1

## 2019-05-28 MED ORDER — AZELASTINE HCL 0.1 % NA SOLN
1.0000 | Freq: Two times a day (BID) | NASAL | Status: DC
  Administered 2019-05-28 (×2): 1 via NASAL
  Filled 2019-05-28: qty 30

## 2019-05-28 MED ORDER — MONTELUKAST SODIUM 10 MG PO TABS
10.0000 mg | ORAL_TABLET | Freq: Every day | ORAL | Status: DC
  Administered 2019-05-28: 10 mg via ORAL
  Filled 2019-05-28: qty 1

## 2019-05-28 MED ORDER — VITAMIN B-12 1000 MCG PO TABS
1000.0000 ug | ORAL_TABLET | Freq: Every day | ORAL | Status: DC
  Administered 2019-05-28 – 2019-05-29 (×2): 1000 ug via ORAL
  Filled 2019-05-28 (×2): qty 1

## 2019-05-28 MED ORDER — FLUTICASONE PROPIONATE 50 MCG/ACT NA SUSP
1.0000 | Freq: Every day | NASAL | Status: DC
  Administered 2019-05-28 – 2019-05-29 (×2): 1 via NASAL
  Filled 2019-05-28: qty 16

## 2019-05-28 MED ORDER — METOPROLOL TARTRATE 12.5 MG HALF TABLET
12.5000 mg | ORAL_TABLET | Freq: Two times a day (BID) | ORAL | Status: DC
  Administered 2019-05-28 – 2019-05-29 (×3): 12.5 mg via ORAL
  Filled 2019-05-28 (×3): qty 1

## 2019-05-28 MED ORDER — PANTOPRAZOLE SODIUM 40 MG PO TBEC
40.0000 mg | DELAYED_RELEASE_TABLET | Freq: Two times a day (BID) | ORAL | Status: DC
  Administered 2019-05-28 – 2019-05-29 (×3): 40 mg via ORAL
  Filled 2019-05-28 (×3): qty 1

## 2019-05-28 MED ORDER — SODIUM CHLORIDE 0.9 % IV SOLN: INTRAVENOUS | Status: AC

## 2019-05-28 NOTE — Progress Notes (Addendum)
Progress Note    Daniel Herrera  [id number] DOB: 10-01-1940  DOA: 05/27/2019 PCP: [name], NP    Brief Narrative:   Chief complaint: hematochezia  Medical records reviewed and are as summarized below:  Daniel Herrera is an 79 y.o. male with a past medical history significant for very hard of hearing, CAD status post CABG, hyperlipidemia, hypertension, hypothyroidism, hiatal hernia, colon polyp, diverticulosis, internal hemorrhoids presented to the emergency department February 23 chief complaint of bright red blood per rectum.  He had 2 more episodes in the emergency department.  Case discussed with GI recommended medicine admit for observation and they would consult in the morning  Assessment/Plan:   Principal Problem:   Hematochezia Active Problems:   Essential hypertension   Acute lower GI bleeding   CAD (coronary artery disease)   ILD (interstitial lung disease) (HCC)   CKD (chronic kidney disease) stage 3, GFR 30-59 ml/min   History of prostate cancer  #1.  Hematochezia/acute lower GI bleed.  Patient had 2 episodes of bright red blood per rectum prior to presentation and 2 since presentation.  Hemoglobin drifting down slowly but remains at 12.  No shortness of breath no dizziness no palpitations.  No abdominal pain.  Evaluated by gastroenterology who opined likely diverticular bleed and had a colonoscopy 4 months ago.  Recommend observation and full liquid diet. -Monitor intake and output -Serial CBCs -Full liquid diet  #2.  Hypertension.  Blood pressure high end of normal.  Home medications include metoprolol -Will resume home metoprolol at a lower dose for now -Monitor closely  #3.  Chronic kidney disease stage III.  Appears to be stable at baseline. -Gentle IV fluids -Monitor intake and output  #4.  CAD status post CABG.  No chest pain.  Home medications include aspirin. -Hold aspirin for now    Family Communication/Anticipated D/C date and  plan/Code Status   DVT prophylaxis: scd Code Status: Full Code.  Family Communication: patient Disposition Plan: From home, suspect can be discharged home in the a.m. if hemoglobin stable   Medical Consultants:   gastroenterology    Subjective:   Ambulating in room with steady gait.  Denies dizziness shortness of breath palpitations.  A hard of hearing  Objective:    Vitals:   05/28/19 0400 05/28/19 0430 05/28/19 0555 05/28/19 0822  BP: (!) 146/75 122/69 (!) 145/116 (!) 136/91  Pulse: 76 75 84 91  Resp: 10 15 20 18   Temp:    97.8 F (36.6 C)  TempSrc:    Oral  SpO2: 99% 96% 98% 100%  Weight:      Height:        Intake/Output Summary (Last 24 hours) at 05/28/2019 1221 Last data filed at 05/28/2019 1000 Gross per 24 hour  Intake 3.94 ml  Output 1 ml  Net 2.94 ml   Filed Weights   05/27/19 2143  Weight: 90.7 kg    Exam: General: Slightly pale but no acute distress CV: Regular rate and rhythm no murmur gallop or rub no lower extremity edema Respiratory: No increased work of breathing breath sounds are clear bilaterally throughout I hear no wheezes no crackles Abdomen: Soft nondistended positive bowel sounds throughout nontender to palpation no guarding or rebounding Musculoskeletal: Joints without swelling/erythema full range of motion Neuro: Alert and oriented x3 speech clear facial symmetry  Data Reviewed:   I have personally reviewed following labs and imaging studies:  Labs: Labs show the following:   Basic Metabolic  Panel: Recent Labs  Lab 05/27/19 2217  NA 140  K 4.1  CL 108  CO2 22  GLUCOSE 161*  BUN 21  CREATININE 1.62*  CALCIUM 9.2   GFR Estimated Creatinine Clearance: 41.1 mL/min (A) (by C-G formula based on SCr of 1.62 mg/dL (H)). Liver Function Tests: Recent Labs  Lab 05/23/19 0826 05/27/19 2217  AST 22 22  ALT 19 23  ALKPHOS 93 59  BILITOT 0.3 1.0  PROT 6.5 6.0*  ALBUMIN 4.6 3.8   No results for input(s): LIPASE, AMYLASE  in the last 168 hours. No results for input(s): AMMONIA in the last 168 hours. Coagulation profile No results for input(s): INR, PROTIME in the last 168 hours.  CBC: Recent Labs  Lab 05/27/19 2217 05/28/19 0252 05/28/19 0743  WBC 6.4  --   --   HGB 13.4 12.2* 12.1*  HCT 41.1 36.6* 36.4*  MCV 92.8  --   --   PLT 187  --   --    Cardiac Enzymes: No results for input(s): CKTOTAL, CKMB, CKMBINDEX, TROPONINI in the last 168 hours. BNP (last 3 results) No results for input(s): PROBNP in the last 8760 hours. CBG: No results for input(s): GLUCAP in the last 168 hours. D-Dimer: No results for input(s): DDIMER in the last 72 hours. Hgb A1c: No results for input(s): HGBA1C in the last 72 hours. Lipid Profile: No results for input(s): CHOL, HDL, LDLCALC, TRIG, CHOLHDL, LDLDIRECT in the last 72 hours. Thyroid function studies: No results for input(s): TSH, T4TOTAL, T3FREE, THYROIDAB in the last 72 hours.  Invalid input(s): FREET3 Anemia work up: No results for input(s): VITAMINB12, FOLATE, FERRITIN, TIBC, IRON, RETICCTPCT in the last 72 hours. Sepsis Labs: Recent Labs  Lab 05/27/19 2217  WBC 6.4    Microbiology Recent Results (from the past 240 hour(s))  SARS CORONAVIRUS 2 (TAT 6-24 HRS) Nasopharyngeal Nasopharyngeal Swab     Status: None   Collection Time: 05/28/19  5:48 AM   Specimen: Nasopharyngeal Swab  Result Value Ref Range Status   SARS Coronavirus 2 NEGATIVE NEGATIVE Final    Comment: (NOTE) SARS-CoV-2 target nucleic acids are NOT DETECTED. The SARS-CoV-2 RNA is generally detectable in upper and lower respiratory specimens during the acute phase of infection. Negative results do not preclude SARS-CoV-2 infection, do not rule out co-infections with other pathogens, and should not be used as the sole basis for treatment or other patient management decisions. Negative results must be combined with clinical observations, patient history, and epidemiological  information. The expected result is Negative. Fact Sheet for Patients: SugarRoll.be Fact Sheet for Healthcare Providers: https://www.woods-mathews.com/ This test is not yet approved or cleared by the [place] FDA and  has been authorized for detection and/or diagnosis of SARS-CoV-2 by FDA under an Emergency Use Authorization (EUA). This EUA will remain  in effect (meaning this test can be used) for the duration of the COVID-19 declaration under Section 56 4(b)(1) of the Act, 21 U.S.C. section 360bbb-3(b)(1), unless the authorization is terminated or revoked sooner. Performed at [facility], [place] [address], [place], [place] [address]     Procedures and diagnostic studies:  No results found.  Medications:     Continuous Infusions:  sodium chloride 75 mL/hr at 05/28/19 1000     LOS: 0 days   Radene Gunning NP [facility]   How to contact the [facility] Attending or Consulting provider [place] or covering provider during after hours [place], for this patient?  Check  the care team in [facility] and look for a) attending/consulting [place] provider listed and b) the [facility] team listed Log into [email or web address] and use [place]'s universal password to access. If you do not have the password, please contact the hospital operator. Locate the [facility] [place] provider you are looking for under [facility] and page to a number that you can be directly reached. If you still have difficulty reaching the provider, please page the [facility] (Director on Call) for the Hospitalists listed on amion for assistance.  05/28/2019, 12:21 PM

## 2019-05-28 NOTE — Plan of Care (Signed)
  Problem: Education: Goal: Ability to identify signs and symptoms of gastrointestinal bleeding will improve Outcome: Progressing   Problem: Bowel/Gastric: Goal: Will show no signs and symptoms of gastrointestinal bleeding Outcome: Progressing

## 2019-05-28 NOTE — Consult Note (Signed)
[facility] Consult: 9:23 AM 05/28/2019  LOS: 0 days    Referring Provider: Dr Eliseo Squires  Primary Care Physician:  [name], NP Primary Gastroenterologist:  Dr. Lyndel Safe     Reason for Consultation: Rectal bleeding.   HPI: Daniel Herrera is a 79 y.o. male.  Hx CAD.  NSTEMI, cardiac stenting 2016. CABG 05/2018.  PAF.  Hereditary angioedema treated with Takhzyro shots twice a month.  Interstitial lung disease, CT scan on 05/19/2019.  Prostate cancer treated with radiation 2016.  Kidney stones.  Renal cysts.  CKD 3.  Htn.  Hypothyroidism.  DM  2. remote appendectomy.  Cholelithiasis vs biliary sludge on chest CT 05/19/2019 2019 diverticular bleed at which point Plavix discontinued.  C. Difficile.  Normocytic anemia and thrombocytopenia in 05/2018.    01/31/2019 colonoscopy for rectal bleeding, chronic diarrhea..  Removed 8 mm polyp in descending colon (tubular adenoma, no HGD).  Moderate to severe diverticulosis predominantly in sigmoid.  Mild melanosis coli.  Random colon biopsies (benign colonic mucosa no evidence for inflammation or microscopic colitis) 01/31/2019 EGD for heartburn, excessive clearing of phlegm.  Tortuous esophagus.  2 cm HH.  Minor gastritis, biopsied (mild chronic gastritis, no H. pylori, metaplasia, dysplasia etc).  Examined duodenum normal, biopsied obtained for histology (benign, no villous blunting or increased lymphocytes)  Home meds include twice daily Protonix and low-dose aspirin.  No blood thinners, Plavix etc.  Presented to the ED last night with 2 episodes painless hematochezia starting 1700 yesterday.  Had 2 more episodes in the ED, the last was about 2 AM this morning.  Other than the bleeding, he feels fine.  No associated dizziness, abdominal pain, nausea, vomiting. Vital signs stable,  heart rate initially in the low 100s, now in the 90s.  Somewhat hypertensive diastolic pressures.  Excellent O2 sats. Hgb 13.4 >> 12.1.  Was 11.2 in 06/2018, between 8 and 9.5 in 05/2018  Fm Hx: Mother with stomach cancer.    Past Medical History:  Diagnosis Date  . Angio-edema   . Anxiety   . Arthritis   . CAD (coronary artery disease)    a. 02/2015: DES to mid-LAD  . Cataract   . Geographic tongue   . GERD (gastroesophageal reflux disease)   . Gout   . High cholesterol   . History of prolonged Q-T interval on ECG   . Hx of Clostridium difficile infection   . Hx of umbilical hernia repair   . Hypertension   . Myocardial infarction ([place])   . Personal history of digestive disease    gastric ulcer  . Prostate cancer ([place])   . Prostate enlargement   . Skin cancer    basal cell carcinoma  . Thyroid disease   . Urticaria     Past Surgical History:  Procedure Laterality Date  . APPENDECTOMY    . BACK SURGERY    . CARDIAC CATHETERIZATION N/A 02/21/2015   Procedure: Left Heart Cath;  Surgeon: Lorretta Harp, MD;  Location: [place] [facility];  Service: Cardiovascular;  Laterality: N/A;  .  CATARACT EXTRACTION, BILATERAL    . CORONARY ARTERY BYPASS GRAFT N/A 05/24/2018   Procedure: CORONARY ARTERY BYPASS GRAFTING (CABG), ON PUMP, TIMES THREE, USING BILATERAL INTERNAL MAMMARY ARTERY AND HARVESTED LEFT RADIAL ARTERY;  Surgeon: Gaye Pollack, MD;  Location: [facility];  Service: Open Heart Surgery;  Laterality: N/A;  . LEFT HEART CATH AND CORONARY ANGIOGRAPHY N/A 05/22/2018   Procedure: LEFT HEART CATH AND CORONARY ANGIOGRAPHY;  Surgeon: Jettie Booze, MD;  Location: [place] [facility];  Service: Cardiovascular;  Laterality: N/A;  . NASAL SINUS SURGERY    . PROSTATE BIOPSY    . RADIAL ARTERY HARVEST Left 05/24/2018   Procedure: RADIAL ARTERY HARVEST;  Surgeon: Gaye Pollack, MD;  Location: [place];  Service: Open Heart Surgery;  Laterality: Left;  . right shoulder rotator cuff  repair    . TEE WITHOUT CARDIOVERSION N/A 05/24/2018   Procedure: TRANSESOPHAGEAL ECHOCARDIOGRAM (TEE);  Surgeon: Gaye Pollack, MD;  Location: [place];  Service: Open Heart Surgery;  Laterality: N/A;    Prior to Admission medications   Medication Sig Start Date End Date Taking? Authorizing Provider  aspirin EC 81 MG tablet Take 1 tablet (81 mg total) by mouth daily. 01/02/18  Yes Kilroy, Luke K, PA-C  azelastine (ASTELIN) 0.1 % nasal spray Place 1 spray into both nostrils 2 (two) times daily. 09/10/18  Yes Kozlow, Donnamarie Poag, MD  baclofen (LIORESAL) 10 MG tablet Take 0.5-1 tablets (5-10 mg total) by mouth 3 (three) times daily as needed for muscle spasms. 03/20/19  Yes Hilts, Legrand Como, MD  Cyanocobalamin (VITAMIN B-12) 5000 MCG TBDP Take 1,000 mcg by mouth daily.   Yes [provider]  EPINEPHRINE 0.3 mg/0.3 mL IJ SOAJ injection INJECT 0.3 ML(0.3MG ) INTO THE MUSCLE AS NEEDED FOR ALLERGIC REACTION Patient taking differently: Inject 0.3 mg into the muscle as needed for anaphylaxis.  11/25/18  Yes Kozlow, Donnamarie Poag, MD  ezetimibe (ZETIA) 10 MG tablet Take 1 tablet (10 mg total) by mouth daily. 02/24/19 05/27/28 Yes Lorretta Harp, MD  fluticasone (FLONASE) 50 MCG/ACT nasal spray SHAKE LIQUID AND USE 1 SPRAY IN EACH NOSTRIL DAILY Patient taking differently: Place 1 spray into both nostrils daily.  02/20/19  Yes Kozlow, Donnamarie Poag, MD  levothyroxine (SYNTHROID) 88 MCG tablet Take 88 mcg by mouth daily. 02/19/19  Yes [provider]  metoprolol tartrate (LOPRESSOR) 25 MG tablet Take 1 tablet (25 mg total) by mouth 2 (two) times daily. 05/25/15  Yes Short, Noah Delaine, MD  montelukast (SINGULAIR) 10 MG tablet Take 1 tablet (10 mg total) by mouth at bedtime. 09/10/18  Yes Kozlow, Donnamarie Poag, MD  nabumetone (RELAFEN) 750 MG tablet Take 1,500 mg by mouth daily. 05/16/19  Yes [provider]  nitroGLYCERIN (NITROSTAT) 0.4 MG SL tablet DISSOLVE 1 TABLET UNDER TONGUE EVERY 5 MINUTES FOR 3 DOSES AS NEEDED  CHEST PAIN. IF NO RELIEF CALL 911 Patient taking differently: Place 0.4 mg under the tongue every 5 (five) minutes as needed for chest pain.  03/03/15  Yes Simmons, Brittainy M, PA-C  OVER THE COUNTER MEDICATION Take 1-2 capsules by mouth See admin instructions. Health Plus - Super Colon Cleanse 1 capsule in the morning and 2 capsules at night as needed for bowel movement   Yes [provider]  pantoprazole (PROTONIX) 40 MG tablet TAKE 1 TABLET(40 MG) BY MOUTH TWICE DAILY Patient taking differently: Take 40 mg by mouth 2 (two) times daily.  03/18/19  Yes Kozlow, Donnamarie Poag, MD  Pitavastatin Calcium (LIVALO)  4 MG TABS Take 4 mg by mouth at bedtime.    Yes [provider]  TAKHZYRO 300 MG/2ML SOLN Inject 300 mg into the muscle every 14 (fourteen) days. Injection ever 14 days 12/12/18  Yes [provider]    Scheduled Meds:  Infusions: . sodium chloride     PRN Meds:    Allergies as of 05/27/2019 - Review Complete 05/27/2019  Allergen Reaction Noted  . Lisinopril Swelling 07/28/2017  . Benadryl [diphenhydramine hcl] Swelling 10/27/2012  . Darvocet [propoxyphene n-acetaminophen] Other (See Comments) 10/27/2012  . Darvon [propoxyphene hcl] Other (See Comments) 10/27/2012  . Tylenol [acetaminophen] Swelling 05/22/2015    Family History  Problem Relation Age of Onset  . Cancer Mother   . Stomach cancer Mother   . Cancer Father        prostate  . Cancer Sister     Social History   Socioeconomic History  . Marital status: Married    Spouse name: Not on file  . Number of children: Not on file  . Years of education: Not on file  . Highest education level: Not on file  Occupational History  . Occupation: retired  Tobacco Use  . Smoking status: Never Smoker  . Smokeless tobacco: Never Used  Substance and Sexual Activity  . Alcohol use: Never  . Drug use: No  . Sexual activity: Not on file  Other Topics Concern  . Not on file  Social History Narrative    . Not on file   Social Determinants of Health   Financial Resource Strain:   . Difficulty of Paying Living Expenses: Not on file  Food Insecurity:   . Worried About Charity fundraiser in the Last Year: Not on file  . Ran Out of Food in the Last Year: Not on file  Transportation Needs:   . Lack of Transportation (Medical): Not on file  . Lack of Transportation (Non-Medical): Not on file  Physical Activity:   . Days of Exercise per Week: Not on file  . Minutes of Exercise per Session: Not on file  Stress:   . Feeling of Stress : Not on file  Social Connections:   . Frequency of Communication with Friends and Family: Not on file  . Frequency of Social Gatherings with Friends and Family: Not on file  . Attends Religious Services: Not on file  . Active Member of Clubs or Organizations: Not on file  . Attends Archivist Meetings: Not on file  . Marital Status: Not on file  Intimate Partner Violence:   . Fear of Current or Ex-Partner: Not on file  . Emotionally Abused: Not on file  . Physically Abused: Not on file  . Sexually Abused: Not on file    REVIEW OF SYSTEMS: Constitutional: No weakness, no fatigue.  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  Keeping a garden. ENT:  No nose bleeds.  Sinus congestion is chronic. Pulm: Although he has interstitial lung disease, he denies shortness of breath and can easily walk for 20 minutes without an issue.  Chronic, occasionally productive cough. CV:  No palpitations, no LE edema.  GU:  No hematuria, no frequency GI: See HPI. Heme: Denies excessive or unusual bleeding other than the recent onset lower GI bleeding. Transfusions: No record of transfusions in epic Neuro:  No headaches, no peripheral tingling or numbness.  No dizziness.  No seizures.  No syncope. Derm:  No itching, no rash or sores.  Endocrine:  No  sweats or chills.  No polyuria or dysuria Immunization: Reviewed.  Current on Pneumovax and flu vaccine.   Completed the 2 series of COVID-19 vaccination, last shot was 2 or 3 weeks ago. Travel:  None beyond local counties in last few months.    PHYSICAL EXAM: Vital signs in last 24 hours: Vitals:   05/28/19 0555 05/28/19 0822  BP: (!) 145/116 (!) 136/91  Pulse: 84 91  Resp: 20 18  Temp:  97.8 F (36.6 C)  SpO2: 98% 100%   Wt Readings from Last 3 Encounters:  05/27/19 90.7 kg  05/27/19 93.9 kg  04/09/19 92.8 kg    General: Pleasant, well appearing, comfortable elderly WM. Head: No facial asymmetry or swelling.  No signs of head trauma. Eyes: No scleral icterus.  No conjunctival pallor.  EOMI. Ears: Not HOH Nose: No congestion or discharge Mouth: Tongue midline.  Mucosa moist, clear, pink. Neck: No JVD, no masses, no thyromegaly Lungs: Crackles at the bases.  No labored breathing, no cough.  No hoarse vocal quality. Heart: RRR.  No MRG.  S1, S2 present. Abdomen: Not tender.  Active bowel sounds.  No distention.  No HSM, masses, hernias, bruits.   Rectal: Deferred Musc/Skeltl: No joint redness, swelling or gross deformity. Extremities: No CCE. Neurologic: Fully alert and oriented.  Excellent historian.  No tremors.  Moves all 4 limbs, strength not tested.  No gross neurologic deficits. Skin: No telangiectasia, sores, rashes. Tattoos: None observed Nodes: No cervical adenopathy Psych: Pleasant, cooperative, calm, fluid speech.  Intake/Output from previous day: No intake/output data recorded. Intake/Output this shift: No intake/output data recorded.  LAB RESULTS: Recent Labs    05/27/19 2217 05/28/19 0252 05/28/19 0743  WBC 6.4  --   --   HGB 13.4 12.2* 12.1*  HCT 41.1 36.6* 36.4*  PLT 187  --   --    BMET Lab Results  Component Value Date   NA 140 05/27/2019   NA 136 06/15/2018   NA 135 05/28/2018   K 4.1 05/27/2019   K 4.0 06/15/2018   K 3.5 05/28/2018   CL 108 05/27/2019   CL 105 06/15/2018   CL 106 05/28/2018   CO2 22 05/27/2019   CO2 24 06/15/2018    CO2 24 05/28/2018   GLUCOSE 161 (H) 05/27/2019   GLUCOSE 183 (H) 06/15/2018   GLUCOSE 114 (H) 05/28/2018   BUN 21 05/27/2019   BUN 23 06/15/2018   BUN 21 05/28/2018   CREATININE 1.62 (H) 05/27/2019   CREATININE 1.53 (H) 06/15/2018   CREATININE 1.19 05/28/2018   CALCIUM 9.2 05/27/2019   CALCIUM 8.7 (L) 06/15/2018   CALCIUM 7.8 (L) 05/28/2018   LFT Recent Labs    05/27/19 2217  PROT 6.0*  ALBUMIN 3.8  AST 22  ALT 23  ALKPHOS 59  BILITOT 1.0   PT/INR Lab Results  Component Value Date   INR 1.36 05/24/2018   INR 0.97 12/21/2017   Hepatitis Panel No results for input(s): HEPBSAG, HCVAB, HEPAIGM, HEPBIGM in the last 72 hours. C-Diff No components found for: CDIFF Lipase     Component Value Date/Time   LIPASE 24 05/22/2015 1903    Drugs of Abuse  No results found for: LABOPIA, COCAINSCRNUR, LABBENZ, AMPHETMU, THCU, LABBARB   RADIOLOGY STUDIES: No results found.    IMPRESSION:   *    Painless hematochezia.  Presumed diverticular bleed.  Last episode 8 hours ago. Presumed diverticular bleed in 12/2017.   Most recent colonoscopy in 01/2019 revealed diverticulosis, adenomatous  colon polyp and no specific source for chronic diarrhea.  *    CAD, cardiac stent 2016.  Three-vessel CABG 05/2018.  Hx PAF.  Takes baby aspirin, no other thinners/platelet disrupting medication  *   CKD stage 3b  *   Hx prostate cancer (?year), treated with radiation.  *   Completed 2 shot COVID-19 immunization.   *   Stable, not particularly symptomatic interstitial lung disease  *     Hereditary angioedema.  PLAN:     *   Observe.  No plans for nuclear medicine bleeding scan, CT angio or colonoscopy. Next CBC planned for 1400.  Spoke with hospitalist, Dr. Lucianne Lei and will start full liquid diet.   Azucena Freed  05/28/2019, 9:23 AM Phone [phone]

## 2019-05-28 NOTE — H&P (Signed)
History and Physical    Daniel Herrera [id number] DOB: February 04, 1941 DOA: 05/27/2019  PCP: [name], NP Patient coming from: Home  Chief Complaint: Rectal bleeding  HPI: Daniel Herrera is a 79 y.o. male with medical history significant of CAD status post CABG, hypertension, hyperlipidemia, hypothyroidism, gastritis, hiatal hernia, colon polyp, diverticulosis, internal hemorrhoids presenting to the ED for evaluation of rectal bleeding.  Patient states since yesterday 5 PM he has had 3 episodes of rectal bleeding.  Each time he has noticed a lot of bright red blood in the toilet with clots mixed in.  No abdominal pain or hematemesis.  He takes a baby aspirin daily and no other blood thinners.  No other complaints.  Denies lightheadedness/dizziness, chest pain, palpitations, or shortness of breath.  ED Course: Hemodynamically stable.  Hemoglobin stable at 13.4.  FOBT pending. ED provider discussed the case with Dr. Hilarie Fredrickson from GI who recommended admission for observation, GI will consult in a.m.  Review of Systems:  All systems reviewed and apart from history of presenting illness, are negative.  Past Medical History:  Diagnosis Date  . Angio-edema   . Anxiety   . Arthritis   . CAD (coronary artery disease)    a. 02/2015: DES to mid-LAD  . Cataract   . Geographic tongue   . GERD (gastroesophageal reflux disease)   . Gout   . High cholesterol   . History of prolonged Q-T interval on ECG   . Hx of Clostridium difficile infection   . Hx of umbilical hernia repair   . Hypertension   . Myocardial infarction ([place])   . Personal history of digestive disease    gastric ulcer  . Prostate cancer ([place])   . Prostate enlargement   . Skin cancer    basal cell carcinoma  . Thyroid disease   . Urticaria     Past Surgical History:  Procedure Laterality Date  . APPENDECTOMY    . BACK SURGERY    . CARDIAC CATHETERIZATION N/A 02/21/2015   Procedure: Left Heart Cath;  Surgeon:  Lorretta Harp, MD;  Location: [facility];  Service: Cardiovascular;  Laterality: N/A;  . CATARACT EXTRACTION, BILATERAL    . CORONARY ARTERY BYPASS GRAFT N/A 05/24/2018   Procedure: CORONARY ARTERY BYPASS GRAFTING (CABG), ON PUMP, TIMES THREE, USING BILATERAL INTERNAL MAMMARY ARTERY AND HARVESTED LEFT RADIAL ARTERY;  Surgeon: Gaye Pollack, MD;  Location: [facility];  Service: Open Heart Surgery;  Laterality: N/A;  . LEFT HEART CATH AND CORONARY ANGIOGRAPHY N/A 05/22/2018   Procedure: LEFT HEART CATH AND CORONARY ANGIOGRAPHY;  Surgeon: Jettie Booze, MD;  Location: [facility];  Service: Cardiovascular;  Laterality: N/A;  . NASAL SINUS SURGERY    . PROSTATE BIOPSY    . RADIAL ARTERY HARVEST Left 05/24/2018   Procedure: RADIAL ARTERY HARVEST;  Surgeon: Gaye Pollack, MD;  Location: [facility];  Service: Open Heart Surgery;  Laterality: Left;  . right shoulder rotator cuff repair    . TEE WITHOUT CARDIOVERSION N/A 05/24/2018   Procedure: TRANSESOPHAGEAL ECHOCARDIOGRAM (TEE);  Surgeon: Gaye Pollack, MD;  Location: [facility];  Service: Open Heart Surgery;  Laterality: N/A;     reports that he has never smoked. He has never used smokeless tobacco. He reports that he does not drink alcohol or use drugs.  Allergies  Allergen Reactions  . Lisinopril Swelling    Facial and lip swelling. And throat swelling.   . Benadryl [Diphenhydramine Hcl]  Swelling    Facial swelling from high dose (tolerates Advil PM as needed)  . Darvocet [Propoxyphene N-Acetaminophen] Other (See Comments)    hallucination  . Darvon [Propoxyphene Hcl] Other (See Comments)    Hallucination   . Tylenol [Acetaminophen] Swelling    Foot swelling    Family History  Problem Relation Age of Onset  . Cancer Mother   . Stomach cancer Mother   . Cancer Father        prostate  . Cancer Sister     Prior to Admission medications   Medication Sig Start Date End Date Taking? Authorizing Provider  aspirin EC 81 MG  tablet Take 1 tablet (81 mg total) by mouth daily. 01/02/18   Erlene Quan, PA-C  azelastine (ASTELIN) 0.1 % nasal spray Place 1 spray into both nostrils 2 (two) times daily. 09/10/18   Kozlow, Donnamarie Poag, MD  baclofen (LIORESAL) 10 MG tablet Take 0.5-1 tablets (5-10 mg total) by mouth 3 (three) times daily as needed for muscle spasms. 03/20/19   Hilts, Legrand Como, MD  Cyanocobalamin (VITAMIN B-12) 5000 MCG TBDP Take 1,000 mcg by mouth daily.    [provider]  EPINEPHRINE 0.3 mg/0.3 mL IJ SOAJ injection INJECT 0.3 ML(0.3MG ) INTO THE MUSCLE AS NEEDED FOR ALLERGIC REACTION 11/25/18   Kozlow, Donnamarie Poag, MD  ezetimibe (ZETIA) 10 MG tablet Take 1 tablet (10 mg total) by mouth daily. 02/24/19 05/25/19  Lorretta Harp, MD  fluticasone (FLONASE) 50 MCG/ACT nasal spray SHAKE LIQUID AND USE 1 SPRAY IN [facility] NOSTRIL DAILY 02/20/19   Kozlow, Donnamarie Poag, MD  icatibant [facility]) 30 MG/3ML injection  09/25/18   [provider]  Levothyroxine Sodium 100 MCG CAPS 100 mcg.  11/21/18   [provider]  metoprolol tartrate (LOPRESSOR) 25 MG tablet Take 1 tablet (25 mg total) by mouth 2 (two) times daily. 05/25/15   Janece Canterbury, MD  montelukast (SINGULAIR) 10 MG tablet Take 1 tablet (10 mg total) by mouth at bedtime. 09/10/18   Kozlow, Donnamarie Poag, MD  nitroGLYCERIN (NITROSTAT) 0.4 MG SL tablet DISSOLVE 1 TABLET UNDER TONGUE EVERY 5 MINUTES FOR 3 DOSES AS NEEDED CHEST PAIN. IF NO RELIEF CALL 911 03/03/15   Lyda Jester M, PA-C  OVER THE COUNTER MEDICATION Take 1-2 capsules by mouth See admin instructions. [facility] - Super Colon Cleanse 1 capsule in the morning and 2 capsules at night as needed for bowel movement    [provider]  pantoprazole (PROTONIX) 40 MG tablet TAKE 1 TABLET(40 MG) BY MOUTH TWICE DAILY 03/18/19   Kozlow, Donnamarie Poag, MD  Pitavastatin Calcium (LIVALO) 4 MG TABS Take 4 mg by mouth at bedtime.     [provider]  TAKHZYRO 300 MG/2ML SOLN every 14 (fourteen) days.  Injection ever 14 days 12/12/18   [provider]    Physical Exam: Vitals:   05/28/19 0315 05/28/19 0330 05/28/19 0400 05/28/19 0430  BP: (!) 151/85 (!) 147/96 (!) 146/75 122/69  Pulse: 82 91 76 75  Resp: 14 11 10 15   Temp:      TempSrc:      SpO2: 96% 94% 99% 96%  Weight:      Height:        Physical Exam  Constitutional: He is oriented to person, place, and time. He appears well-developed and well-nourished. No distress.  HENT:  Head: Normocephalic.  Eyes: Right eye exhibits no discharge. Left eye exhibits no discharge.  Cardiovascular: Normal rate, regular rhythm and intact distal  pulses.  Pulmonary/Chest: Effort normal and breath sounds normal. No respiratory distress. He has no wheezes. He has no rales.  Abdominal: Soft. Bowel sounds are normal. He exhibits no distension. There is no abdominal tenderness. There is no guarding.  Musculoskeletal:        General: No edema.     Cervical back: Neck supple.  Neurological: He is alert and oriented to person, place, and time.  Skin: Skin is warm and dry. He is not diaphoretic.     Labs on Admission: I have personally reviewed following labs and imaging studies  CBC: Recent Labs  Lab 05/27/19 2217 05/28/19 0252  WBC 6.4  --   HGB 13.4 12.2*  HCT 41.1 36.6*  MCV 92.8  --   PLT 187  --    Basic Metabolic Panel: Recent Labs  Lab 05/27/19 2217  NA 140  K 4.1  CL 108  CO2 22  GLUCOSE 161*  BUN 21  CREATININE 1.62*  CALCIUM 9.2   GFR: Estimated Creatinine Clearance: 41.1 mL/min (A) (by C-G formula based on SCr of 1.62 mg/dL (H)). Liver Function Tests: Recent Labs  Lab 05/23/19 0826 05/27/19 2217  AST 22 22  ALT 19 23  ALKPHOS 93 59  BILITOT 0.3 1.0  PROT 6.5 6.0*  ALBUMIN 4.6 3.8   No results for input(s): LIPASE, AMYLASE in the last 168 hours. No results for input(s): AMMONIA in the last 168 hours. Coagulation Profile: No results for input(s): INR, PROTIME in the last 168 hours. Cardiac  Enzymes: No results for input(s): CKTOTAL, CKMB, CKMBINDEX, TROPONINI in the last 168 hours. BNP (last 3 results) No results for input(s): PROBNP in the last 8760 hours. HbA1C: No results for input(s): HGBA1C in the last 72 hours. CBG: No results for input(s): GLUCAP in the last 168 hours. Lipid Profile: No results for input(s): CHOL, HDL, LDLCALC, TRIG, CHOLHDL, LDLDIRECT in the last 72 hours. Thyroid Function Tests: No results for input(s): TSH, T4TOTAL, FREET4, T3FREE, THYROIDAB in the last 72 hours. Anemia Panel: No results for input(s): VITAMINB12, FOLATE, FERRITIN, TIBC, IRON, RETICCTPCT in the last 72 hours. Urine analysis:    Component Value Date/Time   COLORURINE STRAW (A) 05/23/2018 1511   APPEARANCEUR CLEAR 05/23/2018 1511   LABSPEC 1.008 05/23/2018 1511   PHURINE 7.0 05/23/2018 1511   GLUCOSEU NEGATIVE 05/23/2018 1511   HGBUR NEGATIVE 05/23/2018 1511   BILIRUBINUR NEGATIVE 05/23/2018 1511   KETONESUR NEGATIVE 05/23/2018 1511   PROTEINUR NEGATIVE 05/23/2018 1511   UROBILINOGEN 0.2 10/27/2012 2246   NITRITE NEGATIVE 05/23/2018 1511   LEUKOCYTESUR NEGATIVE 05/23/2018 1511    Radiological Exams on Admission: No results found.  EKG: Independently reviewed.  Sinus rhythm, nonspecific T wave abnormality.  Assessment/Plan Principal Problem:   Acute lower GI bleeding Active Problems:   Essential hypertension   CKD (chronic kidney disease) stage 3, GFR 30-59 ml/min   CAD (coronary artery disease)   Acute lower GI bleed/ rectal bleeding: Hemodynamically stable.  Hemoglobin stable at 13.4 on initial labs.  FOBT pending.  Suspect bleeding is diverticular. Colonoscopy done October 2020 showing a 8 mm polyp in the distal descending colon (tubular adenoma), moderate to severe predominantly sigmoid diverticulosis, mild melanosis coli, and internal hemorrhoids. ED provider discussed the case with Dr. Hilarie Fredrickson from GI who recommended admission for observation, GI will consult in  a.m. Plan: Type and screen.  Monitor H&H and hemodynamics closely.  Transfuse PRBCs if significant drop in hemoglobin.  Keep n.p.o. and give IV fluid hydration.  CKD stage III: Creatinine 1.6, stable since labs done approximately a year ago.  Hypertension: Hold antihypertensives at this time given acute GI bleed.  CAD status post CABG: Stable.  Hold aspirin given acute GI bleed.  Pharmacy med rec pending.  DVT prophylaxis: SCDs Code Status: Patient wishes to be full code. Family Communication: No family available at this time. Disposition Plan: Anticipate discharge after GI evaluation and work-up for acute lower GI bleed has been completed. Consults called: GI Admission status: It is my clinical opinion that referral for OBSERVATION is reasonable and necessary in this patient based on the above information provided. The aforementioned taken together are felt to place the patient at high risk for further clinical deterioration. However it is anticipated that the patient may be medically stable for discharge from the hospital within 24 to 48 hours.  The medical decision making on this patient was of high complexity and the patient is at high risk for clinical deterioration, therefore this is a level 3 visit.  Shela Leff MD [facility]  If 7PM-7AM, please contact night-coverage [email or web address] Password [facility]  05/28/2019, 5:52 AM

## 2019-05-28 NOTE — ED Notes (Signed)
Pt refused second IV. States " I don't like being tied down."

## 2019-05-28 NOTE — Plan of Care (Signed)
  Problem: Bowel/Gastric: Goal: Will show no signs and symptoms of gastrointestinal bleeding Outcome: Progressing   Problem: Clinical Measurements: Goal: Complications related to the disease process, condition or treatment will be avoided or minimized Outcome: Progressing   

## 2019-05-28 NOTE — ED Provider Notes (Signed)
[place] EMERGENCY DEPARTMENT Provider Note   CSN: [id number] Arrival date & time: 05/27/19  2102     History No chief complaint on file.   Daniel Herrera is a 79 y.o. male.  Patient is an 79 year old male with history of coronary artery disease with CABG, hypertension.  He presents today for evaluation of rectal bleeding.  Patient states he had 2 bowel movements at home this evening that were both a large amount of blood.  He has had 2 additional episodes here.  These bowel movements consist of clots and bright red blood.  He denies any pain.  He denies any fevers or chills.  He denies to me that he is having shortness of breath, chest pain, or lightheadedness.  Patient did have a colonoscopy performed in October 2020.  This showed diverticulosis and there was one polyp which was removed.  The history is provided by the patient.       Past Medical History:  Diagnosis Date  . Angio-edema   . Anxiety   . Arthritis   . CAD (coronary artery disease)    a. 02/2015: DES to mid-LAD  . Cataract   . Geographic tongue   . GERD (gastroesophageal reflux disease)   . Gout   . High cholesterol   . History of prolonged Q-T interval on ECG   . Hx of Clostridium difficile infection   . Hx of umbilical hernia repair   . Hypertension   . Myocardial infarction ([place])   . Personal history of digestive disease    gastric ulcer  . Prostate cancer ([place])   . Prostate enlargement   . Skin cancer    basal cell carcinoma  . Thyroid disease   . Urticaria     Patient Active Problem List   Diagnosis Date Noted  . PAF (paroxysmal atrial fibrillation) ([place]) 06/13/2018  . S/P CABG x 3 05/28/2018  . NSTEMI (non-ST elevated myocardial infarction) ([place]) 05/21/2018  . Tachycardia determined by examination of pulse   . Pre-operative clearance 01/02/2018  . Hematochezia 12/21/2017  . CKD (chronic kidney disease) stage 3, GFR 30-59 ml/min 12/21/2017  . GI bleed 12/21/2017    . Hereditary angioedema type 2 ([place]) 07/28/2017  . Enterotoxigenic Escherichia coli infection 05/24/2015  . STEC (Shiga toxin-producing Escherichia coli) infection 05/24/2015  . Colitis 05/23/2015  . History of prostate cancer   . Chronic diarrhea of unknown origin   . CAD S/P percutaneous coronary angioplasty   . Type 2 diabetes mellitus with unspecified complications ([place])   . Palpitations 03/15/2015  . History of non-ST elevation myocardial infarction (NSTEMI) 02/22/2015  . Abnormal EKG 02/21/2015  . Essential hypertension 02/21/2015  . Dyslipidemia, goal LDL below 70 02/21/2015  . Malignant neoplasm of prostate ([place]) 08/07/2014  . ILD (interstitial lung disease) ([place]) 12/17/2013    Past Surgical History:  Procedure Laterality Date  . APPENDECTOMY    . BACK SURGERY    . CARDIAC CATHETERIZATION N/A 02/21/2015   Procedure: Left Heart Cath;  Surgeon: Lorretta Harp, MD;  Location: [place] [facility];  Service: Cardiovascular;  Laterality: N/A;  . CATARACT EXTRACTION, BILATERAL    . CORONARY ARTERY BYPASS GRAFT N/A 05/24/2018   Procedure: CORONARY ARTERY BYPASS GRAFTING (CABG), ON PUMP, TIMES THREE, USING BILATERAL INTERNAL MAMMARY ARTERY AND HARVESTED LEFT RADIAL ARTERY;  Surgeon: Gaye Pollack, MD;  Location: [facility];  Service: Open Heart Surgery;  Laterality: N/A;  . LEFT HEART CATH AND CORONARY ANGIOGRAPHY N/A 05/22/2018  Procedure: LEFT HEART CATH AND CORONARY ANGIOGRAPHY;  Surgeon: Jettie Booze, MD;  Location: [facility];  Service: Cardiovascular;  Laterality: N/A;  . NASAL SINUS SURGERY    . PROSTATE BIOPSY    . RADIAL ARTERY HARVEST Left 05/24/2018   Procedure: RADIAL ARTERY HARVEST;  Surgeon: Gaye Pollack, MD;  Location: [facility];  Service: Open Heart Surgery;  Laterality: Left;  . right shoulder rotator cuff repair    . TEE WITHOUT CARDIOVERSION N/A 05/24/2018   Procedure: TRANSESOPHAGEAL ECHOCARDIOGRAM (TEE);  Surgeon: Gaye Pollack, MD;  Location: [facility];  Service: Open Heart Surgery;  Laterality: N/A;       Family History  Problem Relation Age of Onset  . Cancer Mother   . Stomach cancer Mother   . Cancer Father        prostate  . Cancer Sister     Social History   Tobacco Use  . Smoking status: Never Smoker  . Smokeless tobacco: Never Used  Substance Use Topics  . Alcohol use: Never  . Drug use: No    Home Medications Prior to Admission medications   Medication Sig Start Date End Date Taking? Authorizing Provider  aspirin EC 81 MG tablet Take 1 tablet (81 mg total) by mouth daily. 01/02/18   Erlene Quan, PA-C  azelastine (ASTELIN) 0.1 % nasal spray Place 1 spray into both nostrils 2 (two) times daily. 09/10/18   Kozlow, Donnamarie Poag, MD  baclofen (LIORESAL) 10 MG tablet Take 0.5-1 tablets (5-10 mg total) by mouth 3 (three) times daily as needed for muscle spasms. 03/20/19   Hilts, Legrand Como, MD  Cyanocobalamin (VITAMIN B-12) 5000 MCG TBDP Take 1,000 mcg by mouth daily.    [provider]  EPINEPHRINE 0.3 mg/0.3 mL IJ SOAJ injection INJECT 0.3 ML(0.3MG ) INTO THE MUSCLE AS NEEDED FOR ALLERGIC REACTION 11/25/18   Kozlow, Donnamarie Poag, MD  ezetimibe (ZETIA) 10 MG tablet Take 1 tablet (10 mg total) by mouth daily. 02/24/19 05/25/19  Lorretta Harp, MD  fluticasone (FLONASE) 50 MCG/ACT nasal spray SHAKE LIQUID AND USE 1 SPRAY IN [facility] NOSTRIL DAILY 02/20/19   Kozlow, Donnamarie Poag, MD  icatibant [facility]) 30 MG/3ML injection  09/25/18   [provider]  Levothyroxine Sodium 100 MCG CAPS 100 mcg.  11/21/18   [provider]  metoprolol tartrate (LOPRESSOR) 25 MG tablet Take 1 tablet (25 mg total) by mouth 2 (two) times daily. 05/25/15   Janece Canterbury, MD  montelukast (SINGULAIR) 10 MG tablet Take 1 tablet (10 mg total) by mouth at bedtime. 09/10/18   Kozlow, Donnamarie Poag, MD  nitroGLYCERIN (NITROSTAT) 0.4 MG SL tablet DISSOLVE 1 TABLET UNDER TONGUE EVERY 5 MINUTES FOR 3 DOSES AS NEEDED CHEST PAIN. IF NO RELIEF CALL 911 03/03/15    Lyda Jester M, PA-C  OVER THE COUNTER MEDICATION Take 1-2 capsules by mouth See admin instructions. [facility] - Super Colon Cleanse 1 capsule in the morning and 2 capsules at night as needed for bowel movement    [provider]  pantoprazole (PROTONIX) 40 MG tablet TAKE 1 TABLET(40 MG) BY MOUTH TWICE DAILY 03/18/19   Kozlow, Donnamarie Poag, MD  Pitavastatin Calcium (LIVALO) 4 MG TABS Take 4 mg by mouth at bedtime.     [provider]  TAKHZYRO 300 MG/2ML SOLN every 14 (fourteen) days. Injection ever 14 days 12/12/18   [provider]    Allergies    Lisinopril, Benadryl [diphenhydramine hcl], Darvocet [propoxyphene n-acetaminophen], Darvon [  propoxyphene hcl], and Tylenol [acetaminophen]  Review of Systems   Review of Systems  All other systems reviewed and are negative.   Physical Exam Updated Vital Signs BP (!) 162/99 (BP Location: Left Arm)   Pulse (!) 102   Temp 98.2 F (36.8 C) (Oral)   Resp 18   Ht 6\' 1"  (1.854 m)   Wt 90.7 kg   SpO2 97%   BMI 26.39 kg/m   Physical Exam Vitals and nursing note reviewed.  Constitutional:      General: He is not in acute distress.    Appearance: He is well-developed. He is not diaphoretic.  HENT:     Head: Normocephalic and atraumatic.  Cardiovascular:     Rate and Rhythm: Normal rate and regular rhythm.     Heart sounds: No murmur. No friction rub.  Pulmonary:     Effort: Pulmonary effort is normal. No respiratory distress.     Breath sounds: Normal breath sounds. No wheezing or rales.  Abdominal:     General: Bowel sounds are normal. There is no distension.     Palpations: Abdomen is soft.     Tenderness: There is no abdominal tenderness.  Musculoskeletal:        General: Normal range of motion.     Cervical back: Normal range of motion and neck supple.  Skin:    General: Skin is warm and dry.  Neurological:     Mental Status: He is alert and oriented to person, place, and time.     Coordination:  Coordination normal.     ED Results / Procedures / Treatments   Labs (all labs ordered are listed, but only abnormal results are displayed) Labs Reviewed  COMPREHENSIVE METABOLIC PANEL - Abnormal; Notable for the following components:      Result Value   Glucose, Bld 161 (*)    Creatinine, Ser 1.62 (*)    Total Protein 6.0 (*)    GFR calc non Af Amer 39 (*)    GFR calc Af Amer 46 (*)    All other components within normal limits  CBC  HEMOGLOBIN AND HEMATOCRIT, BLOOD  POC OCCULT BLOOD, ED  TYPE AND SCREEN    EKG None  Radiology No results found.  Procedures Procedures (including critical care time)  Medications Ordered in ED Medications - No data to display  ED Course  I have reviewed the triage vital signs and the nursing notes.  Pertinent labs & imaging results that were available during my care of the patient were reviewed by me and considered in my medical decision making (see chart for details).    MDM Rules/Calculators/A&P  Patient presents here with complaints of rectal bleeding.  He has had multiple episodes of bright red blood with clotting this evening.  His initial hemoglobin was 13.4, then rechecked at 12.2.  His most recent in October 2020 showed diverticuli and I suspect his bleeding is related to this.  His abdomen is benign and he is hemodynamically stable at this point.  Care was discussed with Dr. Hilarie Fredrickson from gastroenterology.  He recommends admission for observation.  I have spoken with Dr. Marlowe Sax who agrees to admit.  Final Clinical Impression(s) / ED Diagnoses Final diagnoses:  None    Rx / DC Orders ED Discharge Orders    None       Veryl Speak, MD 05/28/19 [phone]

## 2019-05-29 LAB — CBC
HCT: 37.7 % — ABNORMAL LOW (ref 39.0–52.0)
Hemoglobin: 12.4 g/dL — ABNORMAL LOW (ref 13.0–17.0)
MCH: 30.3 pg (ref 26.0–34.0)
MCHC: 32.9 g/dL (ref 30.0–36.0)
MCV: 92.2 fL (ref 80.0–100.0)
Platelets: 154 10*3/uL (ref 150–400)
RBC: 4.09 MIL/uL — ABNORMAL LOW (ref 4.22–5.81)
RDW: 13.7 % (ref 11.5–15.5)
WBC: 4 10*3/uL (ref 4.0–10.5)
nRBC: 0 % (ref 0.0–0.2)

## 2019-05-29 MED ORDER — METOPROLOL TARTRATE 25 MG PO TABS: 12.5000 mg | ORAL_TABLET | Freq: Two times a day (BID) | ORAL | 6 refills | Status: DC

## 2019-05-29 MED ORDER — ASPIRIN EC 81 MG PO TBEC: 81.0000 mg | DELAYED_RELEASE_TABLET | Freq: Every day | ORAL | 3 refills | Status: DC

## 2019-05-29 NOTE — Progress Notes (Signed)
DISCHARGE NOTE HOME [facility] REI to be discharged Home per MD order. Discussed prescriptions and follow up appointments with the patient. Prescriptions given to patient; medication list explained in detail. Patient verbalized understanding.  Skin clean, dry and intact without evidence of skin break down, no evidence of skin tears noted. IV catheter discontinued intact. Site without signs and symptoms of complications. Dressing and pressure applied. Pt denies pain at the site currently. No complaints noted.  Patient free of lines, drains, and wounds.   An After Visit Summary (AVS) was printed and given to the patient. Patient escorted via wheelchair, and discharged home via private auto.  Arlyss Repress, RN

## 2019-05-29 NOTE — Progress Notes (Signed)
          Daily Rounding Note  05/29/2019, 8:28 AM  LOS: 1 day   SUBJECTIVE:   Chief complaint: Painless hematochezia. BP, heart rate stable. Smear of dark, old bloody, stool yesterday evening.  Nothing since.  Feels well.  Not dizzy.  Tolerating full liquids and wondering if he can eat solid food. Is ready to discharge back home. " I know the way back here".  I.e. he knows to come back if he has recurrent bleeding.  OBJECTIVE:         Vital signs in last 24 hours:    Temp:  [97.9 F (36.6 C)-98.1 F (36.7 C)] 98.1 F (36.7 C) (02/25 0429) Pulse Rate:  [74-79] 75 (02/25 0429) Resp:  [16-18] 16 (02/25 0429) BP: (123-143)/(78-87) 129/78 (02/25 0429) SpO2:  [97 %-100 %] 98 % (02/25 0429) Weight:  [94.5 kg] 94.5 kg (02/24 2016) Last BM Date: 05/28/19 Filed Weights   05/27/19 2143 05/28/19 2016  Weight: 90.7 kg 94.5 kg   General: Looks well.  Alert.  Comfortable. Heart: RRR. Chest: Clear bilaterally. Abdomen: Soft.  Not tender.  Active bowel sounds.  Ventral hernia. Extremities: No CCE. Neuro/Psych: Fully alert and oriented.  No gross deficits.  Fluid speech.  Appropriate.  Intake/Output from previous day: 02/24 0701 - 02/25 0700 In: 1477.8 [P.O.:960; I.V.:517.8] Out: 711 [Urine:711]  Intake/Output this shift: No intake/output data recorded.  Lab Results: Recent Labs    05/27/19 2217 05/28/19 0252 05/28/19 0743 05/28/19 1447 05/29/19 0430  WBC 6.4  --   --  3.7* 4.0  HGB 13.4   < > 12.1* 11.8* 12.4*  HCT 41.1   < > 36.4* 34.3* 37.7*  PLT 187  --   --  144* 154   < > = values in this interval not displayed.   BMET Recent Labs    05/27/19 2217  NA 140  K 4.1  CL 108  CO2 22  GLUCOSE 161*  BUN 21  CREATININE 1.62*  CALCIUM 9.2   LFT Recent Labs    05/27/19 2217  PROT 6.0*  ALBUMIN 3.8  AST 22  ALT 23  ALKPHOS 59  BILITOT 1.0   Scheduled Meds: . azelastine  1 spray Each Nare BID  . ezetimibe   10 mg Oral Daily  . fluticasone  1 spray Each Nare Daily  . levothyroxine  88 mcg Oral Q0600  . metoprolol tartrate  12.5 mg Oral BID  . montelukast  10 mg Oral QHS  . pantoprazole  40 mg Oral BID  . vitamin B-12  1,000 mcg Oral Daily   Continuous Infusions: PRN Meds:.baclofen   ASSESMENT:   *   Painless hematochezia.  Presumed diverticular bleed. Established diagnosis per 01/2019 colonoscopy.  *     Blood loss anemia.  Hgb 13.4 >> 12.4.  *    CAD, s/p CABG.  Low-dose aspirin on hold.[name]   PLAN   *   From GI standpoint he is okay for discharge.  Does not need office GI follow-up.  Patient aware that if he develops recurrent large-volume hematochezia, needs to return to the ED.  *    Resume 79 ASA on 3/1  *    Carb modified diet    Azucena Freed  05/29/2019, 8:28 AM Phone [phone]

## 2019-05-29 NOTE — Discharge Summary (Signed)
Physician Discharge Summary  EDOUARD Herrera [id number] DOB: 08-May-1940 DOA: 05/27/2019  PCP: [name], NP  Admit date: 05/27/2019 Discharge date: 05/29/2019  Admitted From: Home Disposition:  Home  Discharge Condition:Stable CODE STATUS:FULL Diet recommendation: Heart Healthy  Brief/Interim Summary: Daniel Herrera is an 79 y.o. male with a past medical history significant for very hard of hearing, CAD status post CABG, hyperlipidemia, hypertension, hypothyroidism, hiatal hernia, colon polyp, diverticulosis, internal hemorrhoids presented to the emergency department February 23 with chief complaint of bright red blood per rectum.  He had 2 more episodes in the emergency department.  Case discussed with GI .  Plan was to observe without any intervention.  His hemoglobin remained stable.  Hematochezia stopped.  GI has cleared him for discharge.  Following problems were addressed during his  Hospitalization:  #1.  Hematochezia/acute lower GI bleed.  Patient had 2 episodes of bright red blood per rectum prior to presentation and 2 since presentation.  Hemoglobin drifting down slowly but remains at 12.  No shortness of breath no dizziness no palpitations.  No abdominal pain.  Evaluated by gastroenterology who opined likely diverticular bleed and had a colonoscopy 4 months ago.  Recommend observation .  Currently H&H stable.  Plan for discharge today.  Tolerated diet. Check CBC in a week.  #2.  Hypertension.  Blood pressure stable -Will resume home metoprolol at a lower dose   #3.  Chronic kidney disease stage III.  Appears to be stable at baseline.  #4.  CAD status post CABG.  No chest pain.  Home medications include aspirin.  Resume aspirin on 06/02/2019   Discharge Diagnoses:  Principal Problem:   Hematochezia Active Problems:   ILD (interstitial lung disease) ([place])   Essential hypertension   History of prostate cancer   CKD (chronic kidney disease) stage 3, GFR 30-59  ml/min   GI bleed   Acute lower GI bleeding   CAD (coronary artery disease)   Diverticulosis    Discharge Instructions  Discharge Instructions    Diet - low sodium heart healthy   Complete by: As directed    Discharge instructions   Complete by: As directed    1)Please follow up with your PCP in a week.  Do a CBC , BMP test during the follow-up 2)Take your medications as instructed.   Increase activity slowly   Complete by: As directed      Allergies as of 05/29/2019      Reactions   Lisinopril Swelling   Facial and lip swelling. And throat swelling.    Benadryl [diphenhydramine Hcl] Swelling   Facial swelling from high dose (tolerates Advil PM as needed)   Darvocet [propoxyphene N-acetaminophen] Other (See Comments)   hallucination   Darvon [propoxyphene Hcl] Other (See Comments)   Hallucination   Tylenol [acetaminophen] Swelling   Foot swelling      Medication List    TAKE these medications   aspirin EC 81 MG tablet Take 1 tablet (81 mg total) by mouth daily. Resume on 06/02/19 What changed: additional instructions   azelastine 0.1 % nasal spray Commonly known as: ASTELIN Place 1 spray into both nostrils 2 (two) times daily.   baclofen 10 MG tablet Commonly known as: LIORESAL Take 0.5-1 tablets (5-10 mg total) by mouth 3 (three) times daily as needed for muscle spasms.   EPINEPHrine 0.3 mg/0.3 mL Soaj injection Commonly known as: EPI-PEN INJECT 0.3 ML(0.3MG ) INTO THE MUSCLE AS NEEDED FOR ALLERGIC REACTION What changed: See the new  instructions.   ezetimibe 10 MG tablet Commonly known as: ZETIA Take 1 tablet (10 mg total) by mouth daily.   fluticasone 50 MCG/ACT nasal spray Commonly known as: FLONASE SHAKE LIQUID AND USE 1 SPRAY IN EACH NOSTRIL DAILY What changed: See the new instructions.   levothyroxine 88 MCG tablet Commonly known as: SYNTHROID Take 88 mcg by mouth daily.   Livalo 4 MG Tabs Generic drug: Pitavastatin Calcium Take 4 mg by mouth at  bedtime.   metoprolol tartrate 25 MG tablet Commonly known as: LOPRESSOR Take 0.5 tablets (12.5 mg total) by mouth 2 (two) times daily. What changed: how much to take   montelukast 10 MG tablet Commonly known as: SINGULAIR Take 1 tablet (10 mg total) by mouth at bedtime.   nabumetone 750 MG tablet Commonly known as: RELAFEN Take 1,500 mg by mouth daily.   nitroGLYCERIN 0.4 MG SL tablet Commonly known as: NITROSTAT DISSOLVE 1 TABLET UNDER TONGUE EVERY 5 MINUTES FOR 3 DOSES AS NEEDED CHEST PAIN. IF NO RELIEF CALL 911 What changed: See the new instructions.   OVER THE COUNTER MEDICATION Take 1-2 capsules by mouth See admin instructions. Health Plus - Super Colon Cleanse 1 capsule in the morning and 2 capsules at night as needed for bowel movement   pantoprazole 40 MG tablet Commonly known as: PROTONIX TAKE 1 TABLET(40 MG) BY MOUTH TWICE DAILY What changed: See the new instructions.   Takhzyro 300 MG/2ML Soln Generic drug: Lanadelumab-flyo Inject 300 mg into the muscle every 14 (fourteen) days. Injection ever 14 days   Vitamin B-12 5000 MCG Tbdp Take 1,000 mcg by mouth daily.      Follow-up Information    [name], [name], NP. Schedule an appointment as soon as possible for a visit in 1 week(s).   Specialty: Internal Medicine Contact information: [place] [address] [phone]          Allergies  Allergen Reactions  . Lisinopril Swelling    Facial and lip swelling. And throat swelling.   . Benadryl [Diphenhydramine Hcl] Swelling    Facial swelling from high dose (tolerates Advil PM as needed)  . Darvocet [Propoxyphene N-Acetaminophen] Other (See Comments)    hallucination  . Darvon [Propoxyphene Hcl] Other (See Comments)    Hallucination   . Tylenol [Acetaminophen] Swelling    Foot swelling    Consultations:  Gastroenterology   Procedures/Studies: CT Chest High Resolution  Result Date: 05/19/2019 CLINICAL DATA:  79 year old  male with history of persistent crackles in the lungs. Evaluate for interstitial lung disease. EXAM: CT CHEST WITHOUT CONTRAST TECHNIQUE: Multidetector CT imaging of the chest was performed following the standard protocol without intravenous contrast. High resolution imaging of the lungs, as well as inspiratory and expiratory imaging, was performed. COMPARISON:  No priors. FINDINGS: Cardiovascular: Heart size is normal. There is no significant pericardial fluid, thickening or pericardial calcification. There is aortic atherosclerosis, as well as atherosclerosis of the great vessels of the mediastinum and the coronary arteries, including calcified atherosclerotic plaque in the left anterior descending, left circumflex and right coronary arteries. Status post median sternotomy for CABG including LIMA to the LAD. Mediastinum/Nodes: No pathologically enlarged mediastinal or hilar lymph nodes. Please note that accurate exclusion of hilar adenopathy is limited on noncontrast CT scans. Small to moderate hiatal hernia. No axillary lymphadenopathy. Lungs/Pleura: High-resolution images demonstrate patchy areas of ground-glass attenuation, peripheral predominant septal thickening, very mild subpleural reticulation and peripheral traction bronchiectasis. Findings have a definitive craniocaudal gradient, most  severe in the extreme lung bases. No definite honeycombing. Inspiratory and expiratory imaging is unremarkable. No acute consolidative airspace disease. No pleural effusions. No definite suspicious appearing pulmonary nodules or masses are noted. Bilateral apical pleuroparenchymal thickening and architectural distortion, most compatible with chronic post infectious or inflammatory scarring. Upper Abdomen: High attenuation material lying dependently in the gallbladder may represent noncalcified gallstones and/or biliary sludge. Incompletely imaged calculus in the upper pole collecting system of the right kidney measuring at  least 3 mm. Multiple parapelvic cysts in the right kidney, similar to prior CT the abdomen and pelvis 12/21/2017. Musculoskeletal: Median sternotomy wires. There are no aggressive appearing lytic or blastic lesions noted in the visualized portions of the skeleton. IMPRESSION: 1. The appearance of the lungs is compatible with interstitial lung disease, with a spectrum of findings considered probable usual interstitial pneumonia (UIP) per current ATS guidelines. Repeat high-resolution chest CT is recommended in 12 months to assess for temporal changes in the appearance of the lung parenchyma. 2. Aortic atherosclerosis, in addition to three-vessel coronary artery disease. Status post median sternotomy for CABG including LIMA to the LAD. 3. Nonobstructive calculus measuring at least 3 mm in the upper pole collecting system of the right kidney. 4. Cholelithiasis versus biliary sludge lying dependently in the gallbladder. Aortic Atherosclerosis (ICD10-I70.0). Electronically Signed   By: Vinnie Langton M.D.   On: 05/19/2019 10:32       Subjective: Patient seen and examined at the bedside this morning.  Hemodynamically stable for discharge today.  Discharge Exam: Vitals:   05/29/19 0429 05/29/19 0922  BP: 129/78 123/73  Pulse: 75 79  Resp: 16 18  Temp: 98.1 F (36.7 C) 97.8 F (36.6 C)  SpO2: 98% 99%   Vitals:   05/28/19 1655 05/28/19 2016 05/29/19 0429 05/29/19 0922  BP: (!) 143/87 123/78 129/78 123/73  Pulse: 74 79 75 79  Resp: 18 18 16 18   Temp: 97.9 F (36.6 C) 97.9 F (36.6 C) 98.1 F (36.7 C) 97.8 F (36.6 C)  TempSrc: Oral Oral Oral Oral  SpO2: 100% 97% 98% 99%  Weight:  94.5 kg    Height:        General: Pt is alert, awake, not in acute distress Cardiovascular: RRR, S1/S2 +, no rubs, no gallops Respiratory: CTA bilaterally, no wheezing, no rhonchi Abdominal: Soft, NT, ND, bowel sounds + Extremities: no edema, no cyanosis    The results of significant diagnostics from  this hospitalization (including imaging, microbiology, ancillary and laboratory) are listed below for reference.     Microbiology: Recent Results (from the past 240 hour(s))  SARS CORONAVIRUS 2 (TAT 6-24 HRS) Nasopharyngeal Nasopharyngeal Swab     Status: None   Collection Time: 05/28/19  5:48 AM   Specimen: Nasopharyngeal Swab  Result Value Ref Range Status   SARS Coronavirus 2 NEGATIVE NEGATIVE Final    Comment: (NOTE) SARS-CoV-2 target nucleic acids are NOT DETECTED. The SARS-CoV-2 RNA is generally detectable in upper and lower respiratory specimens during the acute phase of infection. Negative results do not preclude SARS-CoV-2 infection, do not rule out co-infections with other pathogens, and should not be used as the sole basis for treatment or other patient management decisions. Negative results must be combined with clinical observations, patient history, and epidemiological information. The expected result is Negative. Fact Sheet for Patients: SugarRoll.be Fact Sheet for Healthcare Providers: https://www.woods-mathews.com/ This test is not yet approved or cleared by the [place] FDA and  has been authorized for detection and/or diagnosis of  SARS-CoV-2 by FDA under an Emergency Use Authorization (EUA). This EUA will remain  in effect (meaning this test can be used) for the duration of the COVID-19 declaration under Section 56 4(b)(1) of the Act, 21 U.S.C. section 360bbb-3(b)(1), unless the authorization is terminated or revoked sooner. Performed at [facility], [place] [address], [place], [place] [address]      Labs: BNP (last 3 results) No results for input(s): BNP in the last 8760 hours. Basic Metabolic Panel: Recent Labs  Lab 05/27/19 2217  NA 140  K 4.1  CL 108  CO2 22  GLUCOSE 161*  BUN 21  CREATININE 1.62*  CALCIUM 9.2   Liver Function Tests: Recent Labs  Lab 05/23/19 0826 05/27/19 2217  AST 22 22   ALT 19 23  ALKPHOS 93 59  BILITOT 0.3 1.0  PROT 6.5 6.0*  ALBUMIN 4.6 3.8   No results for input(s): LIPASE, AMYLASE in the last 168 hours. No results for input(s): AMMONIA in the last 168 hours. CBC: Recent Labs  Lab 05/27/19 2217 05/28/19 0252 05/28/19 0743 05/28/19 1447 05/29/19 0430  WBC 6.4  --   --  3.7* 4.0  HGB 13.4 12.2* 12.1* 11.8* 12.4*  HCT 41.1 36.6* 36.4* 34.3* 37.7*  MCV 92.8  --   --  90.5 92.2  PLT 187  --   --  144* 154   Cardiac Enzymes: No results for input(s): CKTOTAL, CKMB, CKMBINDEX, TROPONINI in the last 168 hours. BNP: Invalid input(s): POCBNP CBG: No results for input(s): GLUCAP in the last 168 hours. D-Dimer No results for input(s): DDIMER in the last 72 hours. Hgb A1c No results for input(s): HGBA1C in the last 72 hours. Lipid Profile No results for input(s): CHOL, HDL, LDLCALC, TRIG, CHOLHDL, LDLDIRECT in the last 72 hours. Thyroid function studies No results for input(s): TSH, T4TOTAL, T3FREE, THYROIDAB in the last 72 hours.  Invalid input(s): FREET3 Anemia work up No results for input(s): VITAMINB12, FOLATE, FERRITIN, TIBC, IRON, RETICCTPCT in the last 72 hours. Urinalysis    Component Value Date/Time   COLORURINE STRAW (A) 05/23/2018 1511   APPEARANCEUR CLEAR 05/23/2018 1511   LABSPEC 1.008 05/23/2018 1511   PHURINE 7.0 05/23/2018 1511   GLUCOSEU NEGATIVE 05/23/2018 1511   HGBUR NEGATIVE 05/23/2018 1511   BILIRUBINUR NEGATIVE 05/23/2018 1511   KETONESUR NEGATIVE 05/23/2018 1511   PROTEINUR NEGATIVE 05/23/2018 1511   UROBILINOGEN 0.2 10/27/2012 2246   NITRITE NEGATIVE 05/23/2018 1511   LEUKOCYTESUR NEGATIVE 05/23/2018 1511   Sepsis Labs Invalid input(s): PROCALCITONIN,  WBC,  LACTICIDVEN Microbiology Recent Results (from the past 240 hour(s))  SARS CORONAVIRUS 2 (TAT 6-24 HRS) Nasopharyngeal Nasopharyngeal Swab     Status: None   Collection Time: 05/28/19  5:48 AM   Specimen: Nasopharyngeal Swab  Result Value Ref Range  Status   SARS Coronavirus 2 NEGATIVE NEGATIVE Final    Comment: (NOTE) SARS-CoV-2 target nucleic acids are NOT DETECTED. The SARS-CoV-2 RNA is generally detectable in upper and lower respiratory specimens during the acute phase of infection. Negative results do not preclude SARS-CoV-2 infection, do not rule out co-infections with other pathogens, and should not be used as the sole basis for treatment or other patient management decisions. Negative results must be combined with clinical observations, patient history, and epidemiological information. The expected result is Negative. Fact Sheet for Patients: SugarRoll.be Fact Sheet for Healthcare Providers: https://www.woods-mathews.com/ This test is not yet approved or cleared by the [place] FDA and  has been authorized for detection  and/or diagnosis of SARS-CoV-2 by FDA under an Emergency Use Authorization (EUA). This EUA will remain  in effect (meaning this test can be used) for the duration of the COVID-19 declaration under Section 56 4(b)(1) of the Act, 21 U.S.C. section 360bbb-3(b)(1), unless the authorization is terminated or revoked sooner. Performed at [facility], [place] [address] [place], [place] [address]     Please note: You were cared for by a hospitalist during your hospital stay. Once you are discharged, your primary care physician will handle any further medical issues. Please note that NO REFILLS for any discharge medications will be authorized once you are discharged, as it is imperative that you return to your primary care physician (or establish a relationship with a primary care physician if you do not have one) for your post hospital discharge needs so that they can reassess your need for medications and monitor your lab values.    Time coordinating discharge: 40 minutes  SIGNED:   Shelly Coss, MD  [facility] 05/29/2019, 10:52 AM Pager  [phone]  If 7PM-7AM, please contact night-coverage [email or web address] Password [email or web address]

## 2019-05-30 ENCOUNTER — Telehealth: Payer: Self-pay

## 2019-05-30 NOTE — Telephone Encounter (Signed)
Called and spoke to pt. Pt already taking Zetia. Spoke with Dr. Gwenlyn Found, he recommended that pharmacy reach out to pt about starting Repatha. Will route to anticoag.

## 2019-05-30 NOTE — Telephone Encounter (Signed)
-----   Message from Lorretta Harp, MD sent at 05/29/2019  3:37 PM EST ----- Fasting lipid profile not at goal for secondary prevention on Livalo 4 mg a day.  Add Zetia 10 mg a day and recheck lipid liver in 2 months

## 2019-06-02 ENCOUNTER — Encounter: Payer: Self-pay | Admitting: *Deleted

## 2019-06-02 ENCOUNTER — Other Ambulatory Visit: Payer: Self-pay | Admitting: *Deleted

## 2019-06-02 NOTE — Patient Outreach (Signed)
[place] [facility]) [place] Telephone Outreach PCP office completes Transition of Care follow up post-hospital discharge Post-hospital discharge day # 4 EMMI Red Alert notification/ General Discharge  06/02/2019  Daniel Herrera 1940-10-03 [id number]  EMMI Red Alert notification: General Discharge EMMI Call date/ day #: May 31, 2019; day # 1 Red Alert reason(s):  "wounds not healing well"  Successful telephone outreach to Daniel Herrera, 79 y/o male referred to [place] by [place] 06/02/19 after EMMI red Alert notification received as above; patient has recent hospitalization February 23-25, 2021 for GI/ rectal bleeding; patient was discharged home to self-care without home health services in place.  Patient has history including, but not limited to, [facility]; CAD- NSTEMI with CABG (2020); HTN/ HLD; and hypothyroidism.  HIPAA/ identity verified with patient and [facility] CM services were discussed with patient who provides verbal consent for completion of EMMI Red Alert screening; screening call completed.    Patient reports doing well post-hospital discharge and denies pain, new/ recent falls, clinical concerns/ issues/ problems.  He reports that he and his wife went down to their garden this morning and picked greens from the garden and have just finished freezing all of the produce.  Reports that he does not have any wounds, and states that the EMMI call was difficult to navigate as he is HOH, and the "call just cut off on me."  Patient sounds to be in no distress throughout 30 minute phone call, and he asks that I have the automated EMMI calls to him stopped.  Patient further reports:  -- no concerns/ questions around medications; reports has all and takes all as prescribed; able to independently verbalize post-hospital discharge changes to established medications without prompting; self-manages medications  -- has hospital follow up appointments scheduled  with PCP, cardiologist, and allergist; all discharge instructions reviewed with patient today, who verbalizes good understanding of same; patient drives self to all provider appointments  -- No community resource needs: patient reports he is independent in all aspects of ADL's and iADL's; supportive family members available to assist if needed; patient states his daughter lives directly beside he and his wife SDOH completed for: depression/ transportation/ food insecurity: no concerns identified  -- no signs/ symptoms ongoing GI bleeding: patient is able to accurately verbalize signs/ symptoms GI bleeding along with appropriate action plan  Patient denies further issues, concerns, or problems today.  Patient declines [facility] CM services today, stating no needs.  I provided patient with my direct phone number, the main [facility] CM office phone number, and the [facility] CM 24-hour nurse advice phone number should issues arise in the future and he wish to participate in [facility] CM program.  Plan:  Will mail patient [facility] CM successful outreach letter for future reference  Will make patient's PCP aware that patient completed EMMI screening call and has declined ongoing care coordination needs  Oneta Rack, RN, BSN, [place] Coordinator [facility] Care Management  [phone]

## 2019-06-03 ENCOUNTER — Inpatient Hospital Stay (HOSPITAL_COMMUNITY)
Admission: EM | Admit: 2019-06-03 | Discharge: 2019-06-06 | DRG: 378 | Disposition: A | Discharge status: Home / Self Care | Payer: Medicare Other | Attending: Internal Medicine | Admitting: Internal Medicine

## 2019-06-03 ENCOUNTER — Other Ambulatory Visit: Payer: Self-pay

## 2019-06-03 ENCOUNTER — Telehealth: Payer: Self-pay | Admitting: Gastroenterology

## 2019-06-03 ENCOUNTER — Encounter (HOSPITAL_COMMUNITY): Payer: Self-pay

## 2019-06-03 DIAGNOSIS — Z888 Allergy status to other drugs, medicaments and biological substances status: Secondary | ICD-10-CM | POA: Diagnosis not present

## 2019-06-03 DIAGNOSIS — N179 Acute kidney failure, unspecified: Secondary | ICD-10-CM | POA: Diagnosis not present

## 2019-06-03 DIAGNOSIS — K635 Polyp of colon: Secondary | ICD-10-CM | POA: Diagnosis present

## 2019-06-03 DIAGNOSIS — Z8546 Personal history of malignant neoplasm of prostate: Secondary | ICD-10-CM

## 2019-06-03 DIAGNOSIS — F419 Anxiety disorder, unspecified: Secondary | ICD-10-CM | POA: Diagnosis present

## 2019-06-03 DIAGNOSIS — K5731 Diverticulosis of large intestine without perforation or abscess with bleeding: Secondary | ICD-10-CM | POA: Diagnosis present

## 2019-06-03 DIAGNOSIS — D841 Defects in the complement system: Secondary | ICD-10-CM | POA: Diagnosis present

## 2019-06-03 DIAGNOSIS — M109 Gout, unspecified: Secondary | ICD-10-CM | POA: Diagnosis present

## 2019-06-03 DIAGNOSIS — Z79890 Hormone replacement therapy (postmenopausal): Secondary | ICD-10-CM

## 2019-06-03 DIAGNOSIS — Z951 Presence of aortocoronary bypass graft: Secondary | ICD-10-CM

## 2019-06-03 DIAGNOSIS — I129 Hypertensive chronic kidney disease with stage 1 through stage 4 chronic kidney disease, or unspecified chronic kidney disease: Secondary | ICD-10-CM

## 2019-06-03 DIAGNOSIS — R7301 Impaired fasting glucose: Secondary | ICD-10-CM | POA: Diagnosis not present

## 2019-06-03 DIAGNOSIS — Z886 Allergy status to analgesic agent status: Secondary | ICD-10-CM | POA: Diagnosis not present

## 2019-06-03 DIAGNOSIS — E119 Type 2 diabetes mellitus without complications: Secondary | ICD-10-CM | POA: Diagnosis present

## 2019-06-03 DIAGNOSIS — E039 Hypothyroidism, unspecified: Secondary | ICD-10-CM | POA: Diagnosis present

## 2019-06-03 DIAGNOSIS — K2970 Gastritis, unspecified, without bleeding: Secondary | ICD-10-CM | POA: Diagnosis present

## 2019-06-03 DIAGNOSIS — Z20822 Contact with and (suspected) exposure to covid-19: Secondary | ICD-10-CM | POA: Diagnosis present

## 2019-06-03 DIAGNOSIS — E873 Alkalosis: Secondary | ICD-10-CM | POA: Diagnosis present

## 2019-06-03 DIAGNOSIS — Z91048 Other nonmedicinal substance allergy status: Secondary | ICD-10-CM

## 2019-06-03 DIAGNOSIS — Z8042 Family history of malignant neoplasm of prostate: Secondary | ICD-10-CM

## 2019-06-03 DIAGNOSIS — Z7982 Long term (current) use of aspirin: Secondary | ICD-10-CM

## 2019-06-03 DIAGNOSIS — Z800 Family history of malignant neoplasm of digestive organs: Secondary | ICD-10-CM

## 2019-06-03 DIAGNOSIS — K922 Gastrointestinal hemorrhage, unspecified: Secondary | ICD-10-CM | POA: Diagnosis not present

## 2019-06-03 DIAGNOSIS — I2510 Atherosclerotic heart disease of native coronary artery without angina pectoris: Secondary | ICD-10-CM

## 2019-06-03 DIAGNOSIS — I480 Paroxysmal atrial fibrillation: Secondary | ICD-10-CM | POA: Diagnosis present

## 2019-06-03 DIAGNOSIS — D62 Acute posthemorrhagic anemia: Secondary | ICD-10-CM | POA: Diagnosis present

## 2019-06-03 DIAGNOSIS — E785 Hyperlipidemia, unspecified: Secondary | ICD-10-CM | POA: Diagnosis present

## 2019-06-03 DIAGNOSIS — K449 Diaphragmatic hernia without obstruction or gangrene: Secondary | ICD-10-CM | POA: Diagnosis present

## 2019-06-03 DIAGNOSIS — Z03818 Encounter for observation for suspected exposure to other biological agents ruled out: Secondary | ICD-10-CM | POA: Diagnosis not present

## 2019-06-03 DIAGNOSIS — Z85828 Personal history of other malignant neoplasm of skin: Secondary | ICD-10-CM

## 2019-06-03 DIAGNOSIS — Z923 Personal history of irradiation: Secondary | ICD-10-CM | POA: Diagnosis not present

## 2019-06-03 DIAGNOSIS — K219 Gastro-esophageal reflux disease without esophagitis: Secondary | ICD-10-CM | POA: Diagnosis present

## 2019-06-03 DIAGNOSIS — N1831 Chronic kidney disease, stage 3a: Secondary | ICD-10-CM | POA: Diagnosis present

## 2019-06-03 DIAGNOSIS — K921 Melena: Secondary | ICD-10-CM | POA: Diagnosis not present

## 2019-06-03 DIAGNOSIS — Z79899 Other long term (current) drug therapy: Secondary | ICD-10-CM

## 2019-06-03 DIAGNOSIS — N183 Chronic kidney disease, stage 3 unspecified: Secondary | ICD-10-CM

## 2019-06-03 DIAGNOSIS — K141 Geographic tongue: Secondary | ICD-10-CM | POA: Diagnosis present

## 2019-06-03 DIAGNOSIS — Z8711 Personal history of peptic ulcer disease: Secondary | ICD-10-CM

## 2019-06-03 DIAGNOSIS — K5733 Diverticulitis of large intestine without perforation or abscess with bleeding: Secondary | ICD-10-CM | POA: Diagnosis not present

## 2019-06-03 DIAGNOSIS — I252 Old myocardial infarction: Secondary | ICD-10-CM

## 2019-06-03 HISTORY — DX: Gastrointestinal hemorrhage, unspecified: K92.2

## 2019-06-03 LAB — CBC
HCT: 37.5 % — ABNORMAL LOW (ref 39.0–52.0)
HCT: 33.6 % — ABNORMAL LOW (ref 39.0–52.0)
Hemoglobin: 12.2 g/dL — ABNORMAL LOW (ref 13.0–17.0)
Hemoglobin: 11.3 g/dL — ABNORMAL LOW (ref 13.0–17.0)
MCH: 30.6 pg (ref 26.0–34.0)
MCH: 30.9 pg (ref 26.0–34.0)
MCHC: 32.5 g/dL (ref 30.0–36.0)
MCHC: 33.6 g/dL (ref 30.0–36.0)
MCV: 94 fL (ref 80.0–100.0)
MCV: 91.8 fL (ref 80.0–100.0)
Platelets: 191 10*3/uL (ref 150–400)
Platelets: 194 10*3/uL (ref 150–400)
RBC: 3.99 MIL/uL — ABNORMAL LOW (ref 4.22–5.81)
RBC: 3.66 MIL/uL — ABNORMAL LOW (ref 4.22–5.81)
RDW: 13.7 % (ref 11.5–15.5)
RDW: 13.9 % (ref 11.5–15.5)
WBC: 5.1 10*3/uL (ref 4.0–10.5)
WBC: 4.6 10*3/uL (ref 4.0–10.5)
nRBC: 0 % (ref 0.0–0.2)
nRBC: 0 % (ref 0.0–0.2)

## 2019-06-03 LAB — SARS CORONAVIRUS 2 (TAT 6-24 HRS): SARS Coronavirus 2: NEGATIVE

## 2019-06-03 LAB — COMPREHENSIVE METABOLIC PANEL
ALT: 18 U/L (ref 0–44)
AST: 21 U/L (ref 15–41)
Albumin: 3.7 g/dL (ref 3.5–5.0)
Alkaline Phosphatase: 63 U/L (ref 38–126)
Anion gap: 11 (ref 5–15)
BUN: 20 mg/dL (ref 8–23)
CO2: 21 mmol/L — ABNORMAL LOW (ref 22–32)
Calcium: 9.2 mg/dL (ref 8.9–10.3)
Chloride: 110 mmol/L (ref 98–111)
Creatinine, Ser: 1.67 mg/dL — ABNORMAL HIGH (ref 0.61–1.24)
GFR calc Af Amer: 44 mL/min — ABNORMAL LOW (ref >60)
GFR calc non Af Amer: 38 mL/min — ABNORMAL LOW (ref >60)
Glucose, Bld: 120 mg/dL — ABNORMAL HIGH (ref 70–99)
Potassium: 3.7 mmol/L (ref 3.5–5.1)
Sodium: 142 mmol/L (ref 135–145)
Total Bilirubin: 1.3 mg/dL — ABNORMAL HIGH (ref 0.3–1.2)
Total Protein: 6.1 g/dL — ABNORMAL LOW (ref 6.5–8.1)

## 2019-06-03 LAB — TYPE AND SCREEN
ABO/RH(D): O NEG
Antibody Screen: NEGATIVE

## 2019-06-03 LAB — POC OCCULT BLOOD, ED: Fecal Occult Bld: POSITIVE — AB

## 2019-06-03 MED ORDER — LANADELUMAB-FLYO 300 MG/2ML ~~LOC~~ SOLN: 300.0000 mg | SUBCUTANEOUS | Status: DC

## 2019-06-03 MED ORDER — FLUTICASONE PROPIONATE 50 MCG/ACT NA SUSP
1.0000 | Freq: Every day | NASAL | Status: DC
  Administered 2019-06-04 – 2019-06-06 (×3): 1 via NASAL
  Filled 2019-06-03: qty 16

## 2019-06-03 MED ORDER — SODIUM CHLORIDE 0.9 % IV SOLN: INTRAVENOUS | Status: AC

## 2019-06-03 MED ORDER — PRAVASTATIN SODIUM 40 MG PO TABS
40.0000 mg | ORAL_TABLET | Freq: Every day | ORAL | Status: DC
  Administered 2019-06-03 – 2019-06-05 (×3): 40 mg via ORAL
  Filled 2019-06-03 (×3): qty 1

## 2019-06-03 MED ORDER — OXYCODONE HCL 5 MG PO TABS: 5.0000 mg | ORAL_TABLET | ORAL | Status: DC | PRN

## 2019-06-03 MED ORDER — VITAMIN B-12 1000 MCG PO TABS
5000.0000 ug | ORAL_TABLET | Freq: Every day | ORAL | Status: DC
  Filled 2019-06-03: qty 5

## 2019-06-03 MED ORDER — EZETIMIBE 10 MG PO TABS
10.0000 mg | ORAL_TABLET | Freq: Every day | ORAL | Status: DC
  Administered 2019-06-03 – 2019-06-05 (×3): 10 mg via ORAL
  Filled 2019-06-03 (×4): qty 1

## 2019-06-03 MED ORDER — AZELASTINE HCL 0.1 % NA SOLN
1.0000 | Freq: Two times a day (BID) | NASAL | Status: DC
  Administered 2019-06-04 – 2019-06-05 (×2): 1 via NASAL
  Filled 2019-06-03: qty 30

## 2019-06-03 MED ORDER — METOPROLOL TARTRATE 25 MG PO TABS: 12.5000 mg | ORAL_TABLET | Freq: Two times a day (BID) | ORAL | Status: DC

## 2019-06-03 MED ORDER — LEVOTHYROXINE SODIUM 100 MCG PO TABS
100.0000 ug | ORAL_TABLET | Freq: Every day | ORAL | Status: DC
  Administered 2019-06-04 – 2019-06-06 (×3): 100 ug via ORAL
  Filled 2019-06-03 (×3): qty 1

## 2019-06-03 MED ORDER — PANTOPRAZOLE SODIUM 40 MG PO TBEC
40.0000 mg | DELAYED_RELEASE_TABLET | Freq: Two times a day (BID) | ORAL | Status: DC
  Administered 2019-06-03 – 2019-06-06 (×6): 40 mg via ORAL
  Filled 2019-06-03 (×6): qty 1

## 2019-06-03 MED ORDER — MONTELUKAST SODIUM 10 MG PO TABS
10.0000 mg | ORAL_TABLET | Freq: Every day | ORAL | Status: DC
  Administered 2019-06-03 – 2019-06-05 (×3): 10 mg via ORAL
  Filled 2019-06-03 (×3): qty 1

## 2019-06-03 MED ORDER — METOPROLOL TARTRATE 5 MG/5ML IV SOLN: 2.5000 mg | Freq: Four times a day (QID) | INTRAVENOUS | Status: DC | PRN

## 2019-06-03 MED ORDER — BACLOFEN 5 MG HALF TABLET
5.0000 mg | ORAL_TABLET | Freq: Three times a day (TID) | ORAL | Status: DC | PRN
  Filled 2019-06-03: qty 2

## 2019-06-03 NOTE — Progress Notes (Signed)
New Admission Note:  Arrival Method: Via wheelchair to 67m13 Mental Orientation: Alert & Oriented x4 Telemetry: CCMD verifed  Assessment: Completed Skin: Refer to flowsheet IV: Left FA Pain: 0/10 Tubes: Safety Measures: Safety Fall Prevention Plan discussed with patient. Admission: Completed [facility] [place] Orientation: Patient has been orientated to the room, unit and the staff.  Orders have been reviewed and are being implemented. Will continue to monitor the patient. Call light has been placed within reach.   Vassie Moselle, RN  Phone Number: [phone]

## 2019-06-03 NOTE — H&P (Signed)
History and Physical    Daniel Herrera [id number] DOB: 12-Jun-1940 DOA: 06/03/2019  PCP: [name], NP   Patient coming from: Home  I have personally briefly reviewed patient's old medical records in [place]  Chief Complaint: Rectal bleed  HPI: Daniel Herrera is a 79 y.o. male with medical history significant of recurrent lower GI bleed secondary to alkalosis, CAD status post CABG, hypertension, hyperlipidemia, hypothyroidism, gastritis, hiatal hernia, colon polyp, presented to the ED for evaluation of rectal bleeding.  Patient was just discharged from hospital last week for similar episodes, and on that occasion bleeding was able to stop spontaneously.  Remained stable until this morning, he woke up with 2 large right blood per rectum, Denies lightheadedness/dizziness, chest pain, palpitations, or shortness of breath. He takes a baby aspirin daily and no other blood thinners.  ED Course: Had 1 large bright red hematochezia, vital signs remained stable, hemoglobin level remained stable compared to last time.  Review of Systems: As per HPI otherwise 10 point review of systems negative.   Past Medical History:  Diagnosis Date  . Angio-edema   . Anxiety   . Arthritis   . CAD (coronary artery disease)    a. 02/2015: DES to mid-LAD  . Cataract   . Geographic tongue   . GERD (gastroesophageal reflux disease)   . Gout   . High cholesterol   . History of prolonged Q-T interval on ECG   . Hx of Clostridium difficile infection   . Hx of umbilical hernia repair   . Hypertension   . Myocardial infarction ([place])   . Personal history of digestive disease    gastric ulcer  . Prostate cancer ([place])   . Prostate enlargement   . Skin cancer    basal cell carcinoma  . Thyroid disease   . Urticaria     Past Surgical History:  Procedure Laterality Date  . APPENDECTOMY    . BACK SURGERY    . CARDIAC CATHETERIZATION N/A 02/21/2015   Procedure: Left Heart Cath;  Surgeon:  Lorretta Harp, MD;  Location: [facility];  Service: Cardiovascular;  Laterality: N/A;  . CATARACT EXTRACTION, BILATERAL    . CORONARY ARTERY BYPASS GRAFT N/A 05/24/2018   Procedure: CORONARY ARTERY BYPASS GRAFTING (CABG), ON PUMP, TIMES THREE, USING BILATERAL INTERNAL MAMMARY ARTERY AND HARVESTED LEFT RADIAL ARTERY;  Surgeon: Gaye Pollack, MD;  Location: [facility];  Service: Open Heart Surgery;  Laterality: N/A;  . LEFT HEART CATH AND CORONARY ANGIOGRAPHY N/A 05/22/2018   Procedure: LEFT HEART CATH AND CORONARY ANGIOGRAPHY;  Surgeon: Jettie Booze, MD;  Location: [facility];  Service: Cardiovascular;  Laterality: N/A;  . NASAL SINUS SURGERY    . PROSTATE BIOPSY    . RADIAL ARTERY HARVEST Left 05/24/2018   Procedure: RADIAL ARTERY HARVEST;  Surgeon: Gaye Pollack, MD;  Location: [facility];  Service: Open Heart Surgery;  Laterality: Left;  . right shoulder rotator cuff repair    . TEE WITHOUT CARDIOVERSION N/A 05/24/2018   Procedure: TRANSESOPHAGEAL ECHOCARDIOGRAM (TEE);  Surgeon: Gaye Pollack, MD;  Location: [facility];  Service: Open Heart Surgery;  Laterality: N/A;     reports that he has never smoked. He has never used smokeless tobacco. He reports that he does not drink alcohol or use drugs.  Allergies  Allergen Reactions  . Lisinopril Swelling    Facial and lip swelling. And throat swelling.   . Benadryl [Diphenhydramine Hcl] Swelling  Facial swelling from high dose (tolerates Advil PM as needed)  . Darvocet [Propoxyphene N-Acetaminophen] Other (See Comments)    hallucination  . Darvon [Propoxyphene Hcl] Other (See Comments)    Hallucination   . Tylenol [Acetaminophen] Swelling    Foot swelling    Family History  Problem Relation Age of Onset  . Cancer Mother   . Stomach cancer Mother   . Cancer Father        prostate  . Cancer Sister      Prior to Admission medications   Medication Sig Start Date End Date Taking? Authorizing Provider  aspirin EC 81  MG tablet Take 1 tablet (81 mg total) by mouth daily. Resume on 06/02/19 05/29/19   Shelly Coss, MD  azelastine (ASTELIN) 0.1 % nasal spray Place 1 spray into both nostrils 2 (two) times daily. 09/10/18   Kozlow, Donnamarie Poag, MD  baclofen (LIORESAL) 10 MG tablet Take 0.5-1 tablets (5-10 mg total) by mouth 3 (three) times daily as needed for muscle spasms. 03/20/19   Hilts, Legrand Como, MD  Cyanocobalamin (VITAMIN B-12) 5000 MCG TBDP Take 1,000 mcg by mouth daily.    [provider]  EPINEPHRINE 0.3 mg/0.3 mL IJ SOAJ injection INJECT 0.3 ML(0.3MG ) INTO THE MUSCLE AS NEEDED FOR ALLERGIC REACTION Patient taking differently: Inject 0.3 mg into the muscle as needed for anaphylaxis.  11/25/18   Kozlow, Donnamarie Poag, MD  ezetimibe (ZETIA) 10 MG tablet Take 1 tablet (10 mg total) by mouth daily. 02/24/19 05/27/28  Lorretta Harp, MD  fluticasone (FLONASE) 50 MCG/ACT nasal spray SHAKE LIQUID AND USE 1 SPRAY IN EACH NOSTRIL DAILY Patient taking differently: Place 1 spray into both nostrils daily.  02/20/19   Kozlow, Donnamarie Poag, MD  levothyroxine (SYNTHROID) 88 MCG tablet Take 88 mcg by mouth daily. 02/19/19   [provider]  metoprolol tartrate (LOPRESSOR) 25 MG tablet Take 0.5 tablets (12.5 mg total) by mouth 2 (two) times daily. 05/29/19   Shelly Coss, MD  montelukast (SINGULAIR) 10 MG tablet Take 1 tablet (10 mg total) by mouth at bedtime. 09/10/18   Kozlow, Donnamarie Poag, MD  nabumetone (RELAFEN) 750 MG tablet Take 1,500 mg by mouth daily. 05/16/19   [provider]  nitroGLYCERIN (NITROSTAT) 0.4 MG SL tablet DISSOLVE 1 TABLET UNDER TONGUE EVERY 5 MINUTES FOR 3 DOSES AS NEEDED CHEST PAIN. IF NO RELIEF CALL 911 Patient taking differently: Place 0.4 mg under the tongue every 5 (five) minutes as needed for chest pain.  03/03/15   Lyda Jester M, PA-C  OVER THE COUNTER MEDICATION Take 1-2 capsules by mouth See admin instructions. Health Plus - Super Colon Cleanse 1 capsule in the morning and 2 capsules  at night as needed for bowel movement    [provider]  pantoprazole (PROTONIX) 40 MG tablet TAKE 1 TABLET(40 MG) BY MOUTH TWICE DAILY Patient taking differently: Take 40 mg by mouth 2 (two) times daily.  03/18/19   Kozlow, Donnamarie Poag, MD  Pitavastatin Calcium (LIVALO) 4 MG TABS Take 4 mg by mouth at bedtime.     [provider]  TAKHZYRO 300 MG/2ML SOLN Inject 300 mg into the muscle every 14 (fourteen) days. Injection ever 14 days 12/12/18   [provider]    Physical Exam: Vitals:   06/03/19 1620 06/03/19 1645 06/03/19 1715 06/03/19 1800  BP: (!) 155/80 135/74 112/77 (!) 145/81  Pulse: 78 77 78 79  Resp: 15 14 17 14   Temp:      TempSrc:  SpO2: 100% 98% 98% 99%  Weight:      Height:        Constitutional: NAD, calm, comfortable Vitals:   06/03/19 1620 06/03/19 1645 06/03/19 1715 06/03/19 1800  BP: (!) 155/80 135/74 112/77 (!) 145/81  Pulse: 78 77 78 79  Resp: 15 14 17 14   Temp:      TempSrc:      SpO2: 100% 98% 98% 99%  Weight:      Height:       Eyes: PERRL, lids and conjunctivae normal ENMT: Mucous membranes are moist. Posterior pharynx clear of any exudate or lesions.Normal dentition.  Neck: normal, supple, no masses, no thyromegaly Respiratory: clear to auscultation bilaterally, no wheezing, no crackles. Normal respiratory effort. No accessory muscle use.  Cardiovascular: Regular rate and rhythm, no murmurs / rubs / gallops. No extremity edema. 2+ pedal pulses. No carotid bruits.  Abdomen: no tenderness, no masses palpated. No hepatosplenomegaly. Bowel sounds positive.  Musculoskeletal: no clubbing / cyanosis. No joint deformity upper and lower extremities. Good ROM, no contractures. Normal muscle tone.  Skin: no rashes, lesions, ulcers. No induration Neurologic: CN 2-12 grossly intact. Sensation intact, DTR normal. Strength 5/5 in all 4.  Psychiatric: Normal judgment and insight. Alert and oriented x 3. Normal mood.     Labs on  Admission: I have personally reviewed following labs and imaging studies  CBC: Recent Labs  Lab 05/27/19 2217 05/27/19 2217 05/28/19 0252 05/28/19 0743 05/28/19 1447 05/29/19 0430 06/03/19 1409  WBC 6.4  --   --   --  3.7* 4.0 5.1  HGB 13.4   < > 12.2* 12.1* 11.8* 12.4* 12.2*  HCT 41.1   < > 36.6* 36.4* 34.3* 37.7* 37.5*  MCV 92.8  --   --   --  90.5 92.2 94.0  PLT 187  --   --   --  144* 154 191   < > = values in this interval not displayed.   Basic Metabolic Panel: Recent Labs  Lab 05/27/19 2217 06/03/19 1409  NA 140 142  K 4.1 3.7  CL 108 110  CO2 22 21*  GLUCOSE 161* 120*  BUN 21 20  CREATININE 1.62* 1.67*  CALCIUM 9.2 9.2   GFR: Estimated Creatinine Clearance: 39.9 mL/min (A) (by C-G formula based on SCr of 1.67 mg/dL (H)). Liver Function Tests: Recent Labs  Lab 05/27/19 2217 06/03/19 1409  AST 22 21  ALT 23 18  ALKPHOS 59 63  BILITOT 1.0 1.3*  PROT 6.0* 6.1*  ALBUMIN 3.8 3.7   No results for input(s): LIPASE, AMYLASE in the last 168 hours. No results for input(s): AMMONIA in the last 168 hours. Coagulation Profile: No results for input(s): INR, PROTIME in the last 168 hours. Cardiac Enzymes: No results for input(s): CKTOTAL, CKMB, CKMBINDEX, TROPONINI in the last 168 hours. BNP (last 3 results) No results for input(s): PROBNP in the last 8760 hours. HbA1C: No results for input(s): HGBA1C in the last 72 hours. CBG: No results for input(s): GLUCAP in the last 168 hours. Lipid Profile: No results for input(s): CHOL, HDL, LDLCALC, TRIG, CHOLHDL, LDLDIRECT in the last 72 hours. Thyroid Function Tests: No results for input(s): TSH, T4TOTAL, FREET4, T3FREE, THYROIDAB in the last 72 hours. Anemia Panel: No results for input(s): VITAMINB12, FOLATE, FERRITIN, TIBC, IRON, RETICCTPCT in the last 72 hours. Urine analysis:    Component Value Date/Time   COLORURINE STRAW (A) 05/23/2018 1511   APPEARANCEUR CLEAR 05/23/2018 1511   LABSPEC 1.008 05/23/2018  [place] 7.0 05/23/2018 1511   [place] 05/23/2018 1511   [place] 05/23/2018 1511   BILIRUBINUR NEGATIVE 05/23/2018 1511   KETONESUR NEGATIVE 05/23/2018 1511   PROTEINUR NEGATIVE 05/23/2018 1511   UROBILINOGEN 0.2 10/27/2012 2246   NITRITE NEGATIVE 05/23/2018 1511   LEUKOCYTESUR NEGATIVE 05/23/2018 1511    Radiological Exams on Admission: No results found.  EKG: Ordered  Assessment/Plan Active Problems:   GI bleed   Lower GI bleed  Reurrent lower GI bleed Likely from sigmoid diverticulosis Type and screen, monitor H&H and hemodynamics, as needed transfusion for drop of hemoglobin and symptoms. Patient desires clear liquid, will also start low rate of IV fluid GI recommend conservative management, if there is a significant rebleed and hemoglobin drop or hematuria become unstable, stat consult to IR for CT guided embolization. Hold aspirin  CKd stage III Creatinine level remained stable, no significant uremia  Hypertension Change beta-blocker to as needed  CAD status post CABG No acute chest pain, EKG pending, hold aspirin.    DVT prophylaxis: SCD Code Status: Full Code Family Communication: Daughter over the phone Disposition Plan: Likely will need over two nights hospital stay given frequent GI bleed in past two weeks Consults called: Dr. Henrene Pastor from GI Admission status: Telemetry admission   Lequita Halt MD [facility] Pager [phone]    06/03/2019, 6:21 PM

## 2019-06-03 NOTE — ED Triage Notes (Signed)
Pt arrives POV for eval of rectal bleeding. Pt was seen here last week for same and d/c'd on 2/24. Pt reports the issue had resolved and then restarted this AM. Pt reports "lots of blood w/ clots". Not anticoagulated, no associated abd pain, hematemesis, fever/chills.

## 2019-06-03 NOTE — Consult Note (Signed)
GI ON CALL ATTENDING. Courtesy note  Called by ED PA regarding rectal bleeding (painless and hemodynamically stable). In the hospital last week for the same. Very likely diverticular (Colon 01-2009 with severe sigmoid diverticulosis). Hg 05-28-2018 = 12.4. Today = 12.2  RECOMMENDED 1. Admit to hospitalist team. 2. Supportive care 3. GI inpatient team will see in am (Dr. Fuller Plan is our attending) for full formal consultation 4. If becomes unstable due to bleeding, proceed with CT angiogram and Interventional Radiology consultation for possible embolization therapy.  Discussed with PA Phylliss Bob. Thanks,  Docia Chuck. Geri Seminole., M.D. [facility] Division of Gastroenterology

## 2019-06-03 NOTE — Plan of Care (Signed)
  Problem: Education: Goal: Ability to identify signs and symptoms of gastrointestinal bleeding will improve Outcome: Progressing   

## 2019-06-03 NOTE — Telephone Encounter (Signed)
Looks like he has brisk bleeding Previous history of diverticular bleed Needs to go to ED Please make follow-up appointment with [facility] in 1 to 2 weeks as well.  RG

## 2019-06-03 NOTE — ED Provider Notes (Signed)
[facility] EMERGENCY DEPARTMENT Provider Note   CSN: [id number] Arrival date & time: 06/03/19  1355     History Chief Complaint  Patient presents with  . Rectal Bleeding    Daniel Herrera is a 79 y.o. male with a past medical history of interstitial lung disease, CAD status post CABG x3, A. fib, prostate cancer, GERD, hypertension, who presents today for evaluation of rectal bleeding. He had 3 episodes today of large-volume bright red blood per rectum with clots.  He does not take any blood thinning medications.  He denies any abdominal pain.  No hematemesis.  He denies any nausea, vomiting, fevers or chills.  No fevers at home.  He denies any recent trauma.  No Abdominal bloating.  He called Dr. Steve Rattler office when this happened who referred him to the emergency room.  He was admitted on 05/28/2019 and discharged 05/29/2019 for the same. He reports that this is very similar in volume to previous admission.    Chart review shows that on previous admission he had his hemoglobin trended and was discharged home. He reports concern about going home reporting anxiety as he lives 45 minutes away from the hospital and is concerned about the large volume blood loss.  He denies any feelings of lightheaded or dizziness with position changes.  His last colonoscopy was 01/31/2019 showing a polyp that was removed and he had moderate to severe diverticulosis in the sigmoid region.  HPI     Past Medical History:  Diagnosis Date  . Angio-edema   . Anxiety   . Arthritis   . CAD (coronary artery disease)    a. 02/2015: DES to mid-LAD  . Cataract   . Geographic tongue   . GERD (gastroesophageal reflux disease)   . Gout   . High cholesterol   . History of prolonged Q-T interval on ECG   . Hx of Clostridium difficile infection   . Hx of umbilical hernia repair   . Hypertension   . Myocardial infarction ([place])   . Personal history of digestive disease    gastric ulcer    . Prostate cancer ([place])   . Prostate enlargement   . Skin cancer    basal cell carcinoma  . Thyroid disease   . Urticaria     Patient Active Problem List   Diagnosis Date Noted  . Acute lower GI bleeding 05/28/2019  . CAD (coronary artery disease) 05/28/2019  . Diverticulosis   . PAF (paroxysmal atrial fibrillation) ([place]) 06/13/2018  . S/P CABG x 3 05/28/2018  . NSTEMI (non-ST elevated myocardial infarction) ([place]) 05/21/2018  . Tachycardia determined by examination of pulse   . Pre-operative clearance 01/02/2018  . Hematochezia 12/21/2017  . CKD (chronic kidney disease) stage 3, GFR 30-59 ml/min 12/21/2017  . GI bleed 12/21/2017  . Hereditary angioedema type 2 ([place]) 07/28/2017  . Enterotoxigenic Escherichia coli infection 05/24/2015  . STEC (Shiga toxin-producing Escherichia coli) infection 05/24/2015  . Colitis 05/23/2015  . History of prostate cancer   . Chronic diarrhea of unknown origin   . CAD S/P percutaneous coronary angioplasty   . Type 2 diabetes mellitus with unspecified complications ([place])   . Palpitations 03/15/2015  . History of non-ST elevation myocardial infarction (NSTEMI) 02/22/2015  . Abnormal EKG 02/21/2015  . Essential hypertension 02/21/2015  . Dyslipidemia, goal LDL below 70 02/21/2015  . Malignant neoplasm of prostate ([place]) 08/07/2014  . ILD (interstitial lung disease) ([place]) 12/17/2013    Past Surgical History:  Procedure Laterality  Date  . APPENDECTOMY    . BACK SURGERY    . CARDIAC CATHETERIZATION N/A 02/21/2015   Procedure: Left Heart Cath;  Surgeon: Lorretta Harp, MD;  Location: [facility];  Service: Cardiovascular;  Laterality: N/A;  . CATARACT EXTRACTION, BILATERAL    . CORONARY ARTERY BYPASS GRAFT N/A 05/24/2018   Procedure: CORONARY ARTERY BYPASS GRAFTING (CABG), ON PUMP, TIMES THREE, USING BILATERAL INTERNAL MAMMARY ARTERY AND HARVESTED LEFT RADIAL ARTERY;  Surgeon: Gaye Pollack, MD;  Location: [facility];  Service: Open Heart  Surgery;  Laterality: N/A;  . LEFT HEART CATH AND CORONARY ANGIOGRAPHY N/A 05/22/2018   Procedure: LEFT HEART CATH AND CORONARY ANGIOGRAPHY;  Surgeon: Jettie Booze, MD;  Location: [facility];  Service: Cardiovascular;  Laterality: N/A;  . NASAL SINUS SURGERY    . PROSTATE BIOPSY    . RADIAL ARTERY HARVEST Left 05/24/2018   Procedure: RADIAL ARTERY HARVEST;  Surgeon: Gaye Pollack, MD;  Location: [facility];  Service: Open Heart Surgery;  Laterality: Left;  . right shoulder rotator cuff repair    . TEE WITHOUT CARDIOVERSION N/A 05/24/2018   Procedure: TRANSESOPHAGEAL ECHOCARDIOGRAM (TEE);  Surgeon: Gaye Pollack, MD;  Location: [facility];  Service: Open Heart Surgery;  Laterality: N/A;       Family History  Problem Relation Age of Onset  . Cancer Mother   . Stomach cancer Mother   . Cancer Father        prostate  . Cancer Sister     Social History   Tobacco Use  . Smoking status: Never Smoker  . Smokeless tobacco: Never Used  Substance Use Topics  . Alcohol use: Never  . Drug use: No    Home Medications Prior to Admission medications   Medication Sig Start Date End Date Taking? Authorizing Provider  aspirin EC 81 MG tablet Take 1 tablet (81 mg total) by mouth daily. Resume on 06/02/19 05/29/19   Shelly Coss, MD  azelastine (ASTELIN) 0.1 % nasal spray Place 1 spray into both nostrils 2 (two) times daily. 09/10/18   Kozlow, Donnamarie Poag, MD  baclofen (LIORESAL) 10 MG tablet Take 0.5-1 tablets (5-10 mg total) by mouth 3 (three) times daily as needed for muscle spasms. 03/20/19   Hilts, Legrand Como, MD  Cyanocobalamin (VITAMIN B-12) 5000 MCG TBDP Take 1,000 mcg by mouth daily.    [provider]  EPINEPHRINE 0.3 mg/0.3 mL IJ SOAJ injection INJECT 0.3 ML(0.3MG ) INTO THE MUSCLE AS NEEDED FOR ALLERGIC REACTION Patient taking differently: Inject 0.3 mg into the muscle as needed for anaphylaxis.  11/25/18   Kozlow, Donnamarie Poag, MD  ezetimibe (ZETIA) 10 MG tablet Take 1 tablet (10 mg  total) by mouth daily. 02/24/19 05/27/28  Lorretta Harp, MD  fluticasone (FLONASE) 50 MCG/ACT nasal spray SHAKE LIQUID AND USE 1 SPRAY IN EACH NOSTRIL DAILY Patient taking differently: Place 1 spray into both nostrils daily.  02/20/19   Kozlow, Donnamarie Poag, MD  levothyroxine (SYNTHROID) 88 MCG tablet Take 88 mcg by mouth daily. 02/19/19   [provider]  metoprolol tartrate (LOPRESSOR) 25 MG tablet Take 0.5 tablets (12.5 mg total) by mouth 2 (two) times daily. 05/29/19   Shelly Coss, MD  montelukast (SINGULAIR) 10 MG tablet Take 1 tablet (10 mg total) by mouth at bedtime. 09/10/18   Kozlow, Donnamarie Poag, MD  nabumetone (RELAFEN) 750 MG tablet Take 1,500 mg by mouth daily. 05/16/19   [provider]  nitroGLYCERIN (NITROSTAT) 0.4 MG SL  tablet DISSOLVE 1 TABLET UNDER TONGUE EVERY 5 MINUTES FOR 3 DOSES AS NEEDED CHEST PAIN. IF NO RELIEF CALL 911 Patient taking differently: Place 0.4 mg under the tongue every 5 (five) minutes as needed for chest pain.  03/03/15   Lyda Jester M, PA-C  OVER THE COUNTER MEDICATION Take 1-2 capsules by mouth See admin instructions. Health Plus - Super Colon Cleanse 1 capsule in the morning and 2 capsules at night as needed for bowel movement    [provider]  pantoprazole (PROTONIX) 40 MG tablet TAKE 1 TABLET(40 MG) BY MOUTH TWICE DAILY Patient taking differently: Take 40 mg by mouth 2 (two) times daily.  03/18/19   Kozlow, Donnamarie Poag, MD  Pitavastatin Calcium (LIVALO) 4 MG TABS Take 4 mg by mouth at bedtime.     [provider]  TAKHZYRO 300 MG/2ML SOLN Inject 300 mg into the muscle every 14 (fourteen) days. Injection ever 14 days 12/12/18   [provider]    Allergies    Lisinopril, Benadryl [diphenhydramine hcl], Darvocet [propoxyphene n-acetaminophen], Darvon [propoxyphene hcl], and Tylenol [acetaminophen]  Review of Systems   Review of Systems  Constitutional: Negative for chills and fever.  HENT: Negative for  congestion.   Eyes: Negative for visual disturbance.  Respiratory: Negative for cough, chest tightness and shortness of breath.   Cardiovascular: Negative for chest pain.  Gastrointestinal: Positive for blood in stool. Negative for abdominal pain, diarrhea, nausea and vomiting.  Endocrine: Negative for polyuria.  Genitourinary: Negative for dysuria.  Musculoskeletal: Negative for back pain and neck pain.  Neurological: Negative for weakness and headaches.  All other systems reviewed and are negative.   Physical Exam Updated Vital Signs BP 112/77   Pulse 78   Temp (!) 97.5 F (36.4 C) (Oral)   Resp 17   Ht 6\' 1"  (1.854 m)   Wt 95 kg   SpO2 98%   BMI 27.63 kg/m   Physical Exam Vitals and nursing note reviewed. Exam conducted with a chaperone present.  Constitutional:      General: He is not in acute distress.    Appearance: He is well-developed. He is not diaphoretic.  HENT:     Head: Normocephalic and atraumatic.  Eyes:     General: No scleral icterus.       Right eye: No discharge.        Left eye: No discharge.     Conjunctiva/sclera: Conjunctivae normal.  Cardiovascular:     Rate and Rhythm: Normal rate and regular rhythm.     Pulses: Normal pulses.     Heart sounds: Normal heart sounds.  Pulmonary:     Effort: Pulmonary effort is normal. No respiratory distress.     Breath sounds: No stridor.  Abdominal:     General: Bowel sounds are normal. There is no distension.     Palpations: Abdomen is soft.     Tenderness: There is no abdominal tenderness. There is no guarding.  Genitourinary:    Comments: DRE with frank red blood, empty rectal vault.  Musculoskeletal:        General: No deformity.     Cervical back: Normal range of motion and neck supple.     Right lower leg: No edema.     Left lower leg: No edema.  Skin:    General: Skin is warm and dry.  Neurological:     General: No focal deficit present.     Mental Status: He is alert.     Cranial  Nerves: No  cranial nerve deficit.     Motor: No abnormal muscle tone.  Psychiatric:        Mood and Affect: Mood normal.        Behavior: Behavior normal.     ED Results / Procedures / Treatments   Labs (all labs ordered are listed, but only abnormal results are displayed) Labs Reviewed  COMPREHENSIVE METABOLIC PANEL - Abnormal; Notable for the following components:      Result Value   CO2 21 (*)    Glucose, Bld 120 (*)    Creatinine, Ser 1.67 (*)    Total Protein 6.1 (*)    Total Bilirubin 1.3 (*)    GFR calc non Af Amer 38 (*)    GFR calc Af Amer 44 (*)    All other components within normal limits  CBC - Abnormal; Notable for the following components:   RBC 3.99 (*)    Hemoglobin 12.2 (*)    HCT 37.5 (*)    All other components within normal limits  POC OCCULT BLOOD, ED - Abnormal; Notable for the following components:   Fecal Occult Bld POSITIVE (*)    All other components within normal limits  SARS CORONAVIRUS 2 (TAT 6-24 HRS)  TYPE AND SCREEN    EKG None  Radiology No results found.  Procedures Procedures (including critical care time)  Medications Ordered in ED Medications - No data to display  ED Course  I have reviewed the triage vital signs and the nursing notes.  Pertinent labs & imaging results that were available during my care of the patient were reviewed by me and considered in my medical decision making (see chart for details).  Clinical Course as of Jun 02 1798  Tue Jun 03, 2019  1744 Spoke with Dr. Henrene Pastor who recommends medical admission.    [EH]  1754 Spoke with hospitalist who will see patient for admission.    [EH]    Clinical Course User Index [EH] Lorin Glass, PA-C   While in the ED patient had a 4th large volume BRBPR BM.   MDM Rules/Calculators/A&P                     Patient is an 79 year old man who presents today for evaluation of bright red blood per rectum.  He was admitted and discharged 6 days ago for the same.  He has not  had any events until today.  He had three bright red blood per rectum bowel movements prior to coming to the ED and one while in the ED.  Here he is hemodynamically stable.  His hemoglobin has not significantly changed from his last labs about 1 week ago.  His creatinine is elevated however this appears consistent with his baseline. Rectal exam with empty rectal vault with bright red blood.  I spoke with Dr. Henrene Pastor from GI who recommends medical admission, states that GI will see patient as a consult in the morning.  I spoke with hospitalist who will see patient for admission.  Note: Portions of this report may have been transcribed using voice recognition software. Every effort was made to ensure accuracy; however, inadvertent computerized transcription errors may be present   Final Clinical Impression(s) / ED Diagnoses Final diagnoses:  Diverticular hemorrhage    Rx / DC Orders ED Discharge Orders    None       Ollen Gross 06/03/19 2218    Deno Etienne, DO 06/03/19 2228

## 2019-06-03 NOTE — Telephone Encounter (Signed)
Called and spoke with patient-patient reports he is being taken to the ER by his daughter as "the bleeding has gotten worse and I just did not know what else to do but to go to the hospital because it just got bad fast"- Advised patient that a message would be sent to Dr. Lyndel Safe

## 2019-06-04 DIAGNOSIS — K5731 Diverticulosis of large intestine without perforation or abscess with bleeding: Principal | ICD-10-CM

## 2019-06-04 DIAGNOSIS — K921 Melena: Secondary | ICD-10-CM

## 2019-06-04 DIAGNOSIS — K922 Gastrointestinal hemorrhage, unspecified: Secondary | ICD-10-CM

## 2019-06-04 LAB — CBC
HCT: 30.3 % — ABNORMAL LOW (ref 39.0–52.0)
HCT: 32.9 % — ABNORMAL LOW (ref 39.0–52.0)
Hemoglobin: 10.2 g/dL — ABNORMAL LOW (ref 13.0–17.0)
Hemoglobin: 11.1 g/dL — ABNORMAL LOW (ref 13.0–17.0)
MCH: 30.9 pg (ref 26.0–34.0)
MCH: 30.7 pg (ref 26.0–34.0)
MCHC: 33.7 g/dL (ref 30.0–36.0)
MCHC: 33.7 g/dL (ref 30.0–36.0)
MCV: 91.8 fL (ref 80.0–100.0)
MCV: 91.1 fL (ref 80.0–100.0)
Platelets: 163 10*3/uL (ref 150–400)
Platelets: 176 10*3/uL (ref 150–400)
RBC: 3.3 MIL/uL — ABNORMAL LOW (ref 4.22–5.81)
RBC: 3.61 MIL/uL — ABNORMAL LOW (ref 4.22–5.81)
RDW: 13.8 % (ref 11.5–15.5)
RDW: 13.5 % (ref 11.5–15.5)
WBC: 3.5 10*3/uL — ABNORMAL LOW (ref 4.0–10.5)
WBC: 4.6 10*3/uL (ref 4.0–10.5)
nRBC: 0 % (ref 0.0–0.2)
nRBC: 0 % (ref 0.0–0.2)

## 2019-06-04 MED ORDER — VITAMIN B-12 1000 MCG PO TABS
1000.0000 ug | ORAL_TABLET | Freq: Every day | ORAL | Status: DC
  Administered 2019-06-05 – 2019-06-06 (×2): 1000 ug via ORAL
  Filled 2019-06-04 (×2): qty 1

## 2019-06-04 NOTE — Consult Note (Addendum)
[facility] Consult: 8:54 AM 06/04/2019  LOS: 1 day    Referring Provider: [id number] Primary Care Physician:  [name], NP Primary Gastroenterologist:  Dr. Lyndel Safe   Reason for Consultation: Painless hematochezia.   HPI: Daniel Herrera is a 79 y.o. male.  PMH hereditary angioedema, treated with Takhhzyro shots 2 x monthly.  Prostate cancer, radiation 2016.  CAD.  Drug-eluting stent placed 02/2015.  CABG 05/2018.  Interstitial lung disease.  CKD  3.  Diverticular bleed in 2019, Plavix stopped then.  C. difficile infection.  Basal cell skin cancer.  Normocytic anemia, thrombocytopenia 05/2018.  Surgeries include appendectomy, spine surgery, sinus surgery, right rotator cuff. 01/2019 EGD.  For heartburn.  Tortuous esophagus consistent with presbyesophagus.  Small, 2 cm, HH.  Minor antral gastritis. 01/2019 colonoscopy for rectal bleeding.  Melanosis coli, random colon biopsies.  Sessile, 8 mm distal descending polyp.  Sigmoid diverticulosis associated with luminal narrowing, muscular hypertrophy.  Small, non-bleeding int hemorrhoids.  No radiation changes in the rectum.   Hospital admission 2/23 -05/29/2019 w painless hematochezia presumed diverticular.  Dr. Loletha Carrow did not pursue repeat colonoscopy or radiologic investigation.  Bleeding resolved spontaneously.  Did not require PRBCs. Hgb 13.4 >> 11.8.  Resumed twice daily Protonix but did not resume low-dose aspirin at discharge..  Return to ED last night after episode of painless hematochezia.[name] 2 episodes at home.  3 in the hospital.  This morning past dark, old blood no associated chest/abdominal pain, tachycardia, dyspnea, dizziness.  Feels well.  He is hungry on the clear liquid diet. Hgb 12.2 >> 11.2.   BUN not elevated.  Family history Daniel Herrera had stomach cancer.   Daniel Herrera had prostate cancer.  Social history.  Patient is a nonalcohol consumer.  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  Past Medical History:  Diagnosis Date  . Angio-edema   . Anxiety   . Arthritis   . CAD (coronary artery disease)    a. 02/2015: DES to mid-LAD  . Cataract   . Geographic tongue   . GERD (gastroesophageal reflux disease)   . Gout   . High cholesterol   . History of prolonged Q-T interval on ECG   . Hx of Clostridium difficile infection   . Hx of umbilical hernia repair   . Hypertension   . Myocardial infarction ([place])   . Personal history of digestive disease    gastric ulcer  . Prostate cancer ([place])   . Prostate enlargement   . Skin cancer    basal cell carcinoma  . Thyroid disease   . Urticaria     Past Surgical History:  Procedure Laterality Date  . APPENDECTOMY    . BACK SURGERY    . CARDIAC CATHETERIZATION N/A 02/21/2015   Procedure: Left Heart Cath;  Surgeon: Lorretta Harp, MD;  Location: [facility];  Service: Cardiovascular;  Laterality: N/A;  . CATARACT EXTRACTION, BILATERAL    . CORONARY ARTERY BYPASS GRAFT N/A 05/24/2018   Procedure: CORONARY ARTERY BYPASS GRAFTING (CABG), ON PUMP, TIMES THREE, USING BILATERAL INTERNAL MAMMARY ARTERY AND HARVESTED  LEFT RADIAL ARTERY;  Surgeon: Gaye Pollack, MD;  Location: [facility];  Service: Open Heart Surgery;  Laterality: N/A;  . LEFT HEART CATH AND CORONARY ANGIOGRAPHY N/A 05/22/2018   Procedure: LEFT HEART CATH AND CORONARY ANGIOGRAPHY;  Surgeon: Jettie Booze, MD;  Location: [facility];  Service: Cardiovascular;  Laterality: N/A;  . NASAL SINUS SURGERY    . PROSTATE BIOPSY    . RADIAL ARTERY HARVEST Left 05/24/2018   Procedure: RADIAL ARTERY HARVEST;  Surgeon: Gaye Pollack, MD;  Location: [facility];  Service: Open Heart Surgery;  Laterality: Left;  . right shoulder rotator cuff repair    . TEE WITHOUT CARDIOVERSION N/A 05/24/2018   Procedure: TRANSESOPHAGEAL ECHOCARDIOGRAM (TEE);  Surgeon:  Gaye Pollack, MD;  Location: [facility];  Service: Open Heart Surgery;  Laterality: N/A;    Prior to Admission medications   Medication Sig Start Date End Date Taking? Authorizing Provider  azelastine (ASTELIN) 0.1 % nasal spray Place 1 spray into both nostrils 2 (two) times daily. Patient taking differently: Place 1 spray into both nostrils 2 (two) times daily as needed for rhinitis or allergies.  09/10/18  Yes Kozlow, Donnamarie Poag, MD  baclofen (LIORESAL) 10 MG tablet Take 0.5-1 tablets (5-10 mg total) by mouth 3 (three) times daily as needed for muscle spasms. 03/20/19  Yes Hilts, Legrand Como, MD  Cyanocobalamin (VITAMIN B-12) 5000 MCG TBDP Take 5,000 mcg by mouth daily.    Yes [provider]  dextromethorphan-guaiFENesin (MUCINEX DM) 30-600 MG 12hr tablet Take 1 tablet by mouth in the morning and at bedtime.   Yes [provider]  EPINEPHRINE 0.3 mg/0.3 mL IJ SOAJ injection INJECT 0.3 ML(0.3MG ) INTO THE MUSCLE AS NEEDED FOR ALLERGIC REACTION Patient taking differently: Inject 0.3 mg into the muscle once as needed for anaphylaxis (or as directed for severe allergic reaction).  11/25/18  Yes Kozlow, Donnamarie Poag, MD  ezetimibe (ZETIA) 10 MG tablet Take 1 tablet (10 mg total) by mouth daily. Patient taking differently: Take 10 mg by mouth at bedtime.  02/24/19 05/27/28 Yes Lorretta Harp, MD  fluticasone (FLONASE) 50 MCG/ACT nasal spray SHAKE LIQUID AND USE 1 SPRAY IN EACH NOSTRIL DAILY Patient taking differently: Place 2 sprays into both nostrils daily.  02/20/19  Yes Kozlow, Donnamarie Poag, MD  icatibant [facility]) 30 MG/3ML injection Inject 30 mg into the skin once as needed (for sudden attacks of hereditary angioedema).    Yes [provider]  levothyroxine (SYNTHROID) 100 MCG tablet Take 100 mcg by mouth daily before breakfast.   Yes [provider]  metoprolol tartrate (LOPRESSOR) 25 MG tablet Take 0.5 tablets (12.5 mg total) by mouth 2 (two) times daily. 05/29/19  Yes Shelly Coss, MD  montelukast (SINGULAIR) 10 MG tablet Take 1 tablet (10 mg total) by mouth at bedtime. 09/10/18  Yes Kozlow, Donnamarie Poag, MD  nabumetone (RELAFEN) 750 MG tablet Take 750 mg by mouth 2 (two) times daily.  05/16/19  Yes [provider]  nitroGLYCERIN (NITROSTAT) 0.4 MG SL tablet DISSOLVE 1 TABLET UNDER TONGUE EVERY 5 MINUTES FOR 3 DOSES AS NEEDED CHEST PAIN. IF NO RELIEF CALL 911 Patient taking differently: Place 0.4 mg under the tongue every 5 (five) minutes as needed for chest pain.  03/03/15  Yes Lyda Jester M, PA-C  NONFORMULARY OR COMPOUNDED ITEM Use as directed 5-15 mLs in the mouth or throat See admin instructions. Compounded mouth wash/gargle:1/1/1/1/1 (80 ml's of each) Purified water; Lidocaine Viscous; Nystatin suspension; Prednisolone 15/5;  Maalox: Swish and spit using 5-15 ml's every 6 hours as needed for irritation   Yes [provider]  OVER THE COUNTER MEDICATION Take 2 capsules by mouth See admin instructions. Health Plus/Super Colon Cleanse: Take 2 capsules by mouth in the morning   Yes [provider]  pantoprazole (PROTONIX) 40 MG tablet TAKE 1 TABLET(40 MG) BY MOUTH TWICE DAILY Patient taking differently: Take 40 mg by mouth 2 (two) times daily.  03/18/19  Yes Kozlow, Donnamarie Poag, MD  Pitavastatin Calcium (LIVALO) 4 MG TABS Take 4 mg by mouth at bedtime.    Yes [provider]  TAKHZYRO 300 MG/2ML SOLN Inject 300 mg into the muscle every 14 (fourteen) days.  12/12/18  Yes [provider]  aspirin EC 81 MG tablet Take 1 tablet (81 mg total) by mouth daily. Resume on 06/02/19 05/29/19   Shelly Coss, MD    Scheduled Meds: . azelastine  1 spray Each Nare BID  . ezetimibe  10 mg Oral Daily  . fluticasone  1 spray Each Nare Daily  . levothyroxine  100 mcg Oral QAC breakfast  . montelukast  10 mg Oral QHS  . pantoprazole  40 mg Oral BID  . pravastatin  40 mg Oral q1800  . vitamin B-12  5,000 mcg Oral Daily   Infusions: . sodium  chloride 50 mL/hr at 06/03/19 2048   PRN Meds: baclofen, metoprolol tartrate, oxyCODONE   Allergies as of 06/03/2019 - Review Complete 06/03/2019  Allergen Reaction Noted  . Lisinopril Anaphylaxis, Swelling, and Other (See Comments) 07/28/2017  . Tape Other (See Comments) 06/03/2019  . Benadryl [diphenhydramine hcl] Swelling and Other (See Comments) 10/27/2012  . Darvocet [propoxyphene n-acetaminophen] Other (See Comments) 10/27/2012  . Darvon [propoxyphene hcl] Other (See Comments) 10/27/2012  . Tylenol [acetaminophen] Swelling and Other (See Comments) 05/22/2015    Family History  Problem Relation Age of Onset  . Cancer Daniel Herrera   . Stomach cancer Daniel Herrera   . Cancer Daniel Herrera        prostate  . Cancer Sister     Social History   Socioeconomic History  . Marital status: Married    Spouse name: Not on file  . Number of children: Not on file  . Years of education: Not on file  . Highest education level: Not on file  Occupational History  . Occupation: retired  Tobacco Use  . Smoking status: Never Smoker  . Smokeless tobacco: Never Used  Substance and Sexual Activity  . Alcohol use: Never  . Drug use: No  . Sexual activity: Not on file  Other Topics Concern  . Not on file  Social History Narrative  . Not on file   Social Determinants of Health   Financial Resource Strain:   . Difficulty of Paying Living Expenses: Not on file  Food Insecurity: No Food Insecurity  . Worried About Charity fundraiser in the Last Year: Never true  . Ran Out of Food in the Last Year: Never true  Transportation Needs: No Transportation Needs  . Lack of Transportation (Medical): No  . Lack of Transportation (Non-Medical): No  Physical Activity:   . Days of Exercise per Week: Not on file  . Minutes of Exercise per Session: Not on file  Stress:   . Feeling of Stress : Not on file  Social Connections:   . Frequency of Communication with Friends and Family: Not on file  . Frequency of  Social Gatherings with Friends and Family: Not on  file  . Attends Religious Services: Not on file  . Active Member of Clubs or Organizations: Not on file  . Attends Archivist Meetings: Not on file  . Marital Status: Not on file  Intimate Partner Violence:   . Fear of Current or Ex-Partner: Not on file  . Emotionally Abused: Not on file  . Physically Abused: Not on file  . Sexually Abused: Not on file    REVIEW OF SYSTEMS: Constitutional: No weakness, no fatigue.  Active on his farm. ENT:  No nose bleeds Pulm: Stable dyspnea with intense exertion but as long as he paces himself it is not an issue. CV:  No palpitations, no LE edema.  No chest pain GU:  No hematuria, no frequency GI: Good appetite.  Normally has brown stools.  No dysphagia.  No nausea vomiting. Heme: No unusual bleeding or bruising.  However due to recent hospitalization and blood draws has some purpura at phlebotomy sites. Transfusions: None. Neuro:  No headaches.  No dizziness.  No presyncope.  No blurry vision.  This morning had a few minutes of tingling/numbness in his feet.  Thinks his feet fell asleep. Derm:  No itching, no rash or sores.  Endocrine:  No sweats or chills.  No polyuria or dysuria Immunization: Completed to face COVID-19 vaccination, last shot was about 3 to 4 weeks ago. Travel:  None beyond local counties in last few months.    PHYSICAL EXAM: Vital signs in last 24 hours: Vitals:   06/03/19 2247 06/04/19 0505  BP: (!) 153/85 134/78  Pulse: 91 87  Resp: 18 18  Temp: 97.8 F (36.6 C) 98.1 F (36.7 C)  SpO2: 97% 98%   Wt Readings from Last 3 Encounters:  06/03/19 95 kg  05/28/19 94.5 kg  05/27/19 93.9 kg    General: Does not, looks well.  Actually looks robust.  Comfortable.  Alert. Head: No signs of head trauma.  No facial asymmetry or swelling. Eyes: No conjunctival pallor.  EOMI.  No scleral icterus Ears: Not hard of hearing Nose: No congestion or discharge. Mouth: Good  dentition.  Tongue midline.  Mucosa moist, pink, clear. Neck: No JVD, no masses, no thyromegaly. Lungs: Clear bilaterally.  No labored breathing, no cough. Heart: RRR.  No MRG.  S1, S2 present. Abdomen: Not distended or tender.  Active bowel sounds.  No HSM, masses, bruits, hernias..   Rectal: Deferred Musc/Skeltl: No joint redness, swelling or gross deformity. Extremities: No CCE.   Neurologic: Fully alert and oriented x3.  Excellent historian.  Moves all 4 limbs without gross weakness, strength not tested.  No tremors or gross deficits. Skin: Fading purpura on arms bilaterally.  No rash, no sores. Tattoos: None Nodes: No cervical adenopathy. Psych: Calm, cooperative, congenial.  Intake/Output from previous day: 03/02 0701 - 03/03 0700 In: 880.8 [P.O.:420; I.V.:460.8] Out: -  Intake/Output this shift: No intake/output data recorded.  LAB RESULTS: Recent Labs    06/03/19 1409 06/03/19 2226 06/04/19 0520  WBC 5.1 4.6 3.5*  HGB 12.2* 11.3* 10.2*  HCT 37.5* 33.6* 30.3*  PLT 191 194 163   BMET Lab Results  Component Value Date   NA 142 06/03/2019   NA 140 05/27/2019   NA 136 06/15/2018   K 3.7 06/03/2019   K 4.1 05/27/2019   K 4.0 06/15/2018   CL 110 06/03/2019   CL 108 05/27/2019   CL 105 06/15/2018   CO2 21 (L) 06/03/2019   CO2 22 05/27/2019   CO2 24  06/15/2018   GLUCOSE 120 (H) 06/03/2019   GLUCOSE 161 (H) 05/27/2019   GLUCOSE 183 (H) 06/15/2018   BUN 20 06/03/2019   BUN 21 05/27/2019   BUN 23 06/15/2018   CREATININE 1.67 (H) 06/03/2019   CREATININE 1.62 (H) 05/27/2019   CREATININE 1.53 (H) 06/15/2018   CALCIUM 9.2 06/03/2019   CALCIUM 9.2 05/27/2019   CALCIUM 8.7 (L) 06/15/2018   LFT Recent Labs    06/03/19 1409  PROT 6.1*  ALBUMIN 3.7  AST 21  ALT 18  ALKPHOS 63  BILITOT 1.3*   PT/INR Lab Results  Component Value Date   INR 1.36 05/24/2018   INR 0.97 12/21/2017     RADIOLOGY STUDIES: No results found.    IMPRESSION:   *   Painless  hematochezia.  Suspect diverticular source. Leading once again appears to be subsiding within 24 hours as was the case last week.  *    Blood loss anemia, not in need of blood transfusion.    PLAN:     *   Suspect he missed the window for for nuclear medicine RBC bleeding scan to detect active bleeding since the bleeding appears to be resolving.  If and when he has recurrent acute bleeding, should go to stat/rapid nuclear medicine bleeding scan.  *    I canceled CBC planned for 930 this morning.  Ordered CBC at 5 PM and 5 AM.  *    Advance to full liquid diet.   Azucena Freed  06/04/2019, 8:54 AM Phone [phone]   Attending physician's note   I have taken a history, examined the patient and reviewed the chart. I agree with the Advanced Practitioner's note, impression and recommendations.  79 year old male with history of prostate CA s/p radiation Recurrent hematochezia likely secondary to diverticular hemorrhage Currently not on anticoagulation  Recent colonoscopy in October 2020 for hematochezia showed melanosis coli, sigmoid diverticulosis and subcentimeter polyp was removed.  No radiation proctitis.  Slight decrease in hemoglobin compared to baseline, continue to monitor Continue liquid diet for today, if no further bleeding for tomorrow morning can advance to regular diet  If has acute recurrent bleed consider nuclear med RBC tag scan or rapid bowel prep with flexible sigmoidoscopy given he has predominantly left-sided diverticular disease  GI will continue to follow.  Damaris Hippo , MD [phone]

## 2019-06-04 NOTE — Plan of Care (Signed)
  Problem: Bowel/Gastric: Goal: Will show no signs and symptoms of gastrointestinal bleeding Outcome: Progressing   Problem: Fluid Volume: Goal: Will show no signs and symptoms of excessive bleeding Outcome: Progressing   Problem: Clinical Measurements: Goal: Complications related to the disease process, condition or treatment will be avoided or minimized Outcome: Progressing   

## 2019-06-04 NOTE — Progress Notes (Signed)
Progress Note    Daniel Herrera  [id number] DOB: March 11, 1941  DOA: 06/03/2019 PCP: [name], NP      Brief Narrative:    Medical records reviewed and are as summarized below:  Daniel Herrera is an 79 y.o. male with medical history significant of recurrent lower GI bleed secondary to sigmoid diverticulosis,CAD status post CABG, hypertension, hyperlipidemia, hypothyroidism, gastritis, hiatal hernia, colon polyp. Patient was just discharged from hospital on 05/29/2019 for similar episodes, and on that occasion bleeding was able to stop spontaneously.  He presented to the hospital with rectal bleeding.   He reported having 2 large right blood per rectum. He takes a baby aspirin daily and no other blood thinners.       Assessment/Plan:   Active Problems:   GI bleed   Lower GI bleed   Recurrent acute lower GI bleeding: Possibly from sigmoid diverticulosis.  Patient has been seen by gastroenterologist who recommended conservative management for now.  Per GI, nuclear RBC tagged scan or flex sig would be considered if bleeding recurs.  Acute blood loss anemia: Hemoglobin dropped from 12.2-10.2.  No indication for blood transfusion at this time.  Monitor CBC.  CKD stage IIIa: Creatinine stable.  CAD with history of CABG: Aspirin on hold.  Hypothyroidism: On Synthroid   Body mass index is 27.63 kg/m.   Family Communication/Anticipated D/C date and plan/Code Status   DVT prophylaxis: SCDs Code Status: Full code Family Communication: Plan discussed with the patient Disposition Plan: Patient is from home.  Possible discharge to home home tomorrow if bleeding resolves and he is cleared by gastroenterologist for discharge.      Subjective:   C/o Black stools this morning.  He thinks it might be old blood.  No dizziness, shortness of breath, chest pain, vomiting, hematemesis or abdominal pain.  Objective:    Vitals:   06/03/19 2030 06/03/19 2247 06/04/19  0505 06/04/19 0940  BP: 123/62 (!) 153/85 134/78 (!) 144/81  Pulse: 71 91 87 80  Resp: 13 18 18 18   Temp:  97.8 F (36.6 C) 98.1 F (36.7 C) 97.9 F (36.6 C)  TempSrc:  Oral Oral Oral  SpO2: 97% 97% 98% 96%  Weight:      Height:        Intake/Output Summary (Last 24 hours) at 06/04/2019 1722 Last data filed at 06/04/2019 1300 Gross per 24 hour  Intake 1360.83 ml  Output --  Net 1360.83 ml   Filed Weights   06/03/19 1402  Weight: 95 kg    Exam:  GEN: NAD SKIN: No rash EYES: EOMI ENT: MMM CV: RRR PULM: CTA B ABD: soft, ND, NT, +BS CNS: AAO x 3, non focal EXT: No edema or tenderness   Data Reviewed:   I have personally reviewed following labs and imaging studies:  Labs: Labs show the following:   Basic Metabolic Panel: Recent Labs  Lab 06/03/19 1409  NA 142  K 3.7  CL 110  CO2 21*  GLUCOSE 120*  BUN 20  CREATININE 1.67*  CALCIUM 9.2   GFR Estimated Creatinine Clearance: 39.9 mL/min (A) (by C-G formula based on SCr of 1.67 mg/dL (H)). Liver Function Tests: Recent Labs  Lab 06/03/19 1409  AST 21  ALT 18  ALKPHOS 63  BILITOT 1.3*  PROT 6.1*  ALBUMIN 3.7   No results for input(s): LIPASE, AMYLASE in the last 168 hours. No results for input(s): AMMONIA in the last 168 hours. Coagulation profile No  results for input(s): INR, PROTIME in the last 168 hours.  CBC: Recent Labs  Lab 05/29/19 0430 06/03/19 1409 06/03/19 2226 06/04/19 0520  WBC 4.0 5.1 4.6 3.5*  HGB 12.4* 12.2* 11.3* 10.2*  HCT 37.7* 37.5* 33.6* 30.3*  MCV 92.2 94.0 91.8 91.8  PLT 154 191 194 163   Cardiac Enzymes: No results for input(s): CKTOTAL, CKMB, CKMBINDEX, TROPONINI in the last 168 hours. BNP (last 3 results) No results for input(s): PROBNP in the last 8760 hours. CBG: No results for input(s): GLUCAP in the last 168 hours. D-Dimer: No results for input(s): DDIMER in the last 72 hours. Hgb A1c: No results for input(s): HGBA1C in the last 72 hours. Lipid  Profile: No results for input(s): CHOL, HDL, LDLCALC, TRIG, CHOLHDL, LDLDIRECT in the last 72 hours. Thyroid function studies: No results for input(s): TSH, T4TOTAL, T3FREE, THYROIDAB in the last 72 hours.  Invalid input(s): FREET3 Anemia work up: No results for input(s): VITAMINB12, FOLATE, FERRITIN, TIBC, IRON, RETICCTPCT in the last 72 hours. Sepsis Labs: Recent Labs  Lab 05/29/19 0430 06/03/19 1409 06/03/19 2226 06/04/19 0520  WBC 4.0 5.1 4.6 3.5*    Microbiology Recent Results (from the past 240 hour(s))  SARS CORONAVIRUS 2 (TAT 6-24 HRS) Nasopharyngeal Nasopharyngeal Swab     Status: None   Collection Time: 05/28/19  5:48 AM   Specimen: Nasopharyngeal Swab  Result Value Ref Range Status   SARS Coronavirus 2 NEGATIVE NEGATIVE Final    Comment: (NOTE) SARS-CoV-2 target nucleic acids are NOT DETECTED. The SARS-CoV-2 RNA is generally detectable in upper and lower respiratory specimens during the acute phase of infection. Negative results do not preclude SARS-CoV-2 infection, do not rule out co-infections with other pathogens, and should not be used as the sole basis for treatment or other patient management decisions. Negative results must be combined with clinical observations, patient history, and epidemiological information. The expected result is Negative. Fact Sheet for Patients: SugarRoll.be Fact Sheet for Healthcare Providers: https://www.woods-mathews.com/ This test is not yet approved or cleared by the [place] FDA and  has been authorized for detection and/or diagnosis of SARS-CoV-2 by FDA under an Emergency Use Authorization (EUA). This EUA will remain  in effect (meaning this test can be used) for the duration of the COVID-19 declaration under Section 56 4(b)(1) of the Act, 21 U.S.C. section 360bbb-3(b)(1), unless the authorization is terminated or revoked sooner. Performed at [facility], [place] [address] [place], [place] [address]   SARS CORONAVIRUS 2 (TAT 6-24 HRS) Nasopharyngeal Nasopharyngeal Swab     Status: None   Collection Time: 06/03/19  5:53 PM   Specimen: Nasopharyngeal Swab  Result Value Ref Range Status   SARS Coronavirus 2 NEGATIVE NEGATIVE Final    Comment: (NOTE) SARS-CoV-2 target nucleic acids are NOT DETECTED. The SARS-CoV-2 RNA is generally detectable in upper and lower respiratory specimens during the acute phase of infection. Negative results do not preclude SARS-CoV-2 infection, do not rule out co-infections with other pathogens, and should not be used as the sole basis for treatment or other patient management decisions. Negative results must be combined with clinical observations, patient history, and epidemiological information. The expected result is Negative. Fact Sheet for Patients: SugarRoll.be Fact Sheet for Healthcare Providers: https://www.woods-mathews.com/ This test is not yet approved or cleared by the [place] FDA and  has been authorized for detection and/or diagnosis of SARS-CoV-2 by FDA under an Emergency Use Authorization (EUA). This EUA will remain  in effect (meaning this test  can be used) for the duration of the COVID-19 declaration under Section 56 4(b)(1) of the Act, 21 U.S.C. section 360bbb-3(b)(1), unless the authorization is terminated or revoked sooner. Performed at [facility], [place] [address] [place], [place] [address]     Procedures and diagnostic studies:  No results found.  Medications:   . azelastine  1 spray Each Nare BID  . ezetimibe  10 mg Oral Daily  . fluticasone  1 spray Each Nare Daily  . levothyroxine  100 mcg Oral QAC breakfast  . montelukast  10 mg Oral QHS  . pantoprazole  40 mg Oral BID  . pravastatin  40 mg Oral q1800  . [START ON 06/05/2019] vitamin B-12  1,000 mcg Oral Daily   Continuous Infusions: . sodium chloride 50 mL/hr at 06/04/19 1717      LOS: 1 day   Brennen Gardiner  [facility]     06/04/2019, 5:22 PM

## 2019-06-05 DIAGNOSIS — R7301 Impaired fasting glucose: Secondary | ICD-10-CM

## 2019-06-05 DIAGNOSIS — N179 Acute kidney failure, unspecified: Secondary | ICD-10-CM

## 2019-06-05 LAB — URINALYSIS, ROUTINE W REFLEX MICROSCOPIC
Bilirubin Urine: NEGATIVE
Glucose, UA: NEGATIVE mg/dL
Hgb urine dipstick: NEGATIVE
Ketones, ur: NEGATIVE mg/dL
Leukocytes,Ua: NEGATIVE
Nitrite: NEGATIVE
Protein, ur: NEGATIVE mg/dL
Specific Gravity, Urine: 1.012 (ref 1.005–1.030)
pH: 6 (ref 5.0–8.0)

## 2019-06-05 LAB — BASIC METABOLIC PANEL
Anion gap: 10 (ref 5–15)
BUN: 13 mg/dL (ref 8–23)
CO2: 19 mmol/L — ABNORMAL LOW (ref 22–32)
Calcium: 8.6 mg/dL — ABNORMAL LOW (ref 8.9–10.3)
Chloride: 110 mmol/L (ref 98–111)
Creatinine, Ser: 1.36 mg/dL — ABNORMAL HIGH (ref 0.61–1.24)
GFR calc Af Amer: 57 mL/min — ABNORMAL LOW (ref >60)
GFR calc non Af Amer: 49 mL/min — ABNORMAL LOW (ref >60)
Glucose, Bld: 162 mg/dL — ABNORMAL HIGH (ref 70–99)
Potassium: 3.5 mmol/L (ref 3.5–5.1)
Sodium: 139 mmol/L (ref 135–145)

## 2019-06-05 LAB — CBC
HCT: 30.9 % — ABNORMAL LOW (ref 39.0–52.0)
Hemoglobin: 10.5 g/dL — ABNORMAL LOW (ref 13.0–17.0)
MCH: 30.8 pg (ref 26.0–34.0)
MCHC: 34 g/dL (ref 30.0–36.0)
MCV: 90.6 fL (ref 80.0–100.0)
Platelets: 158 10*3/uL (ref 150–400)
RBC: 3.41 MIL/uL — ABNORMAL LOW (ref 4.22–5.81)
RDW: 13.7 % (ref 11.5–15.5)
WBC: 3.5 10*3/uL — ABNORMAL LOW (ref 4.0–10.5)
nRBC: 0 % (ref 0.0–0.2)

## 2019-06-05 MED ORDER — LACTATED RINGERS IV SOLN: INTRAVENOUS | Status: AC

## 2019-06-05 NOTE — Progress Notes (Addendum)
Daily Rounding Note  06/05/2019, 9:55 AM  LOS: 2 days   SUBJECTIVE:   Chief complaint: Hematochezia, presumed diverticular bleed.     A couple of soft, semi-formed black stools did not see any blood.  No abdominal pain.  No dizziness.  No shortness of breath.  He feels fine just a little bit weak from being in bed for more than 24 hours. No hypotension or tachycardia. Frustrated because his daughter who has been treated for breast cancer before and has been having what sounds like chronic abdominal pain/GI complaints now possibly is diagnosed with appendicitis and has been sitting in the ED for more than 24 hours.  Family feels the communication between providers and her family has been lacking.  She is going to exploratory surgery today.  OBJECTIVE:         Vital signs in last 24 hours:    Temp:  [98 F (36.7 C)-98.6 F (37 C)] 98 F (36.7 C) (03/04 0945) Pulse Rate:  [76-84] 81 (03/04 0945) Resp:  [17-18] 18 (03/04 0945) BP: (103-137)/(58-99) 123/99 (03/04 0945) SpO2:  [95 %-99 %] 98 % (03/04 0945) Last BM Date: 06/04/19 Filed Weights   06/03/19 1402  Weight: 95 kg   General: Looks well.  Comfortable.  Alert. Heart: RRR.  Rate in 80s.   Chest: Clear bilaterally though overall reduced BS.  No cough or labored breathing Abdomen: Nontender, active bowel sounds, soft. Extremities: No CCE. Neuro/Psych: Initially a bit frustrated but after talking for a bit he is calm.  Pleasant.  Good communicator.  No gross deficits.  Intake/Output from previous day: 03/03 0701 - 03/04 0700 In: 900 [P.O.:900] Out: 0   Intake/Output this shift: No intake/output data recorded.  Lab Results: Recent Labs    06/03/19 2226 06/04/19 0520 06/04/19 1703  WBC 4.6 3.5* 4.6  HGB 11.3* 10.2* 11.1*  HCT 33.6* 30.3* 32.9*  PLT 194 163 176   BMET Recent Labs    06/03/19 1409  NA 142  K 3.7  CL 110  CO2 21*  GLUCOSE 120*  BUN 20    CREATININE 1.67*  CALCIUM 9.2   LFT Recent Labs    06/03/19 1409  PROT 6.1*  ALBUMIN 3.7  AST 21  ALT 18  ALKPHOS 63  BILITOT 1.3*   PT/INR No results for input(s): LABPROT, INR in the last 72 hours. Hepatitis Panel No results for input(s): HEPBSAG, HCVAB, HEPAIGM, HEPBIGM in the last 72 hours.  Studies/Results: No results found.   Scheduled Meds: . azelastine  1 spray Each Nare BID  . ezetimibe  10 mg Oral Daily  . fluticasone  1 spray Each Nare Daily  . levothyroxine  100 mcg Oral QAC breakfast  . montelukast  10 mg Oral QHS  . pantoprazole  40 mg Oral BID  . pravastatin  40 mg Oral q1800  . vitamin B-12  1,000 mcg Oral Daily   Continuous Infusions: . lactated ringers 50 mL/hr at 06/05/19 0746   PRN Meds:.baclofen, metoprolol tartrate, oxyCODONE   ASSESMENT:   *    Painless hematochezia, presumed diverticular bleed.  Admitted for limited diverticular bleed 1 week ago.  *    Minor blood loss anemia.  Normal MCV. Hgb 12.2 >> 10.2 >> 11.1.  Has not required transfusion during current or last week's admission  *    CKD 3.   PLAN   *   ?  Home today?  Reiterated to patient  that he may very well have recurrent bleeding at any point.  In the event of recurrent hematochezia, he should go straight to nuclear medicine tagged RBC scan.  Unfortunately when he was initially admitted this was not ordered.  *   Advance diet to [place]  06/05/2019, 9:55 AM Phone [phone]    Attending Physician Note   I have taken an interval history, reviewed the chart and examined the patient. I agree with the Advanced Practitioner's note, impression and recommendations.   Suspected minor recurrent diverticular bleed which has resolved. Advance diet. If stable consider discharge tomorrow. GI signing off, available if needed. GI follow up with Dr. Carmell Austria.   Lucio Edward, MD [facility]

## 2019-06-05 NOTE — Plan of Care (Signed)
  Problem: Education: Goal: Knowledge of General Education information will improve Description Including pain rating scale, medication(s)/side effects and non-pharmacologic comfort measures Outcome: Progressing   

## 2019-06-05 NOTE — Progress Notes (Signed)
PROGRESS NOTE  Daniel Herrera EXH:[id number] DOB: 06-09-1940 DOA: 06/03/2019 PCP: [name], NP   Brief summary:  Daniel Herrera is an 79 y.o. male with medical history significant ofrecurrent lower GI bleed secondary to sigmoid diverticulosis,CAD status post CABG, hypertension, hyperlipidemia, hypothyroidism, gastritis, hiatal hernia, colon polyp. Patientwas just discharged from hospital on 05/29/2019 for similar episodes, and on that occasion bleeding was able to stop spontaneously.  He presented to the hospital with rectal bleeding.  He reported having 2 large right blood per rectum. He takes a baby aspirin daily and no other blood thinners.  HPI/Recap of past 24 hours:  No active bleed, no ab pain, no fever  Assessment/Plan: Active Problems:   GI bleed   Lower GI bleed  Recurrent acute lower GI bleeding: Possibly from sigmoid diverticulosis.  Patient has been seen by gastroenterologist who recommended conservative management for now.  Per GI, nuclear RBC tagged scan or flex sig would be considered if bleeding recurs.  Acute blood loss anemia: Hemoglobin dropped from 12.2-10.05-14-08.5.  No indication for blood transfusion at this time.  Monitor CBC.  AKI on CKD stage IIIa: Creatinine 1.67 on presentation, cr has improved , today 1.36, repeat bmp in am. ua unremarkable, start lr due to poor oral intake  Impaired fasting blood glucose, a1c 6.5 from  A year ago, likely has borderline diabetes, will discuss with patient about diet control  CAD with history of CABG in 05/2018:s/p DEX x2 2016  Aspirin on hold. Continue statin, Denies chest pain  Hypothyroidism: On Synthroid  H/o prostate cancer s/p XRT  H/o hereditary angioedema, no active issues.  DVT Prophylaxis:scd's  Code Status: scd  Family Communication: patient   Disposition Plan:    Patient came from:           home                                                                                                Anticipated d/c place: home  Barriers to d/c OR conditions which need to be met to effect a safe d/c: need gi clearance   Consultants:  gi  Procedures:  none  Antibiotics:  none   Objective: BP (!) 123/99 (BP Location: Left Arm)   Pulse 81   Temp 98 F (36.7 C) (Oral)   Resp 18   Ht '6\' 1"'  (1.854 m)   Wt 95 kg   SpO2 98%   BMI 27.63 kg/m   Intake/Output Summary (Last 24 hours) at 06/05/2019 1630 Last data filed at 06/05/2019 1400 Gross per 24 hour  Intake 970.93 ml  Output 0 ml  Net 970.93 ml   Filed Weights   06/03/19 1402  Weight: 95 kg    Exam: Patient is examined daily including today on 06/05/2019, exams remain the same as of yesterday except that has changed    General:  NAD  Cardiovascular: RRR  Respiratory: CTABL  Abdomen: Soft/ND/NT, positive BS  Musculoskeletal: No Edema  Neuro: alert, oriented   Data Reviewed: Basic Metabolic Panel: Recent Labs  Lab 06/03/19 1409 06/05/19 0933  NA 142  139  K 3.7 3.5  CL 110 110  CO2 21* 19*  GLUCOSE 120* 162*  BUN 20 13  CREATININE 1.67* 1.36*  CALCIUM 9.2 8.6*   Liver Function Tests: Recent Labs  Lab 06/03/19 1409  AST 21  ALT 18  ALKPHOS 63  BILITOT 1.3*  PROT 6.1*  ALBUMIN 3.7   No results for input(s): LIPASE, AMYLASE in the last 168 hours. No results for input(s): AMMONIA in the last 168 hours. CBC: Recent Labs  Lab 06/03/19 1409 06/03/19 2226 06/04/19 0520 06/04/19 1703 06/05/19 0933  WBC 5.1 4.6 3.5* 4.6 3.5*  HGB 12.2* 11.3* 10.2* 11.1* 10.5*  HCT 37.5* 33.6* 30.3* 32.9* 30.9*  MCV 94.0 91.8 91.8 91.1 90.6  PLT 191 194 163 176 158   Cardiac Enzymes:   No results for input(s): CKTOTAL, CKMB, CKMBINDEX, TROPONINI in the last 168 hours. BNP (last 3 results) No results for input(s): BNP in the last 8760 hours.  ProBNP (last 3 results) No results for input(s): PROBNP in the last 8760 hours.  CBG: No results for input(s): GLUCAP in the last 168 hours.  Recent  Results (from the past 240 hour(s))  SARS CORONAVIRUS 2 (TAT 6-24 HRS) Nasopharyngeal Nasopharyngeal Swab     Status: None   Collection Time: 05/28/19  5:48 AM   Specimen: Nasopharyngeal Swab  Result Value Ref Range Status   SARS Coronavirus 2 NEGATIVE NEGATIVE Final    Comment: (NOTE) SARS-CoV-2 target nucleic acids are NOT DETECTED. The SARS-CoV-2 RNA is generally detectable in upper and lower respiratory specimens during the acute phase of infection. Negative results do not preclude SARS-CoV-2 infection, do not rule out co-infections with other pathogens, and should not be used as the sole basis for treatment or other patient management decisions. Negative results must be combined with clinical observations, patient history, and epidemiological information. The expected result is Negative. Fact Sheet for Patients: SugarRoll.be Fact Sheet for Healthcare Providers: https://www.woods-mathews.com/ This test is not yet approved or cleared by the [place] FDA and  has been authorized for detection and/or diagnosis of SARS-CoV-2 by FDA under an Emergency Use Authorization (EUA). This EUA will remain  in effect (meaning this test can be used) for the duration of the COVID-19 declaration under Section 56 4(b)(1) of the Act, 21 U.S.C. section 360bbb-3(b)(1), unless the authorization is terminated or revoked sooner. Performed at [facility], [place] [address], [place], [place] [address]   SARS CORONAVIRUS 2 (TAT 6-24 HRS) Nasopharyngeal Nasopharyngeal Swab     Status: None   Collection Time: 06/03/19  5:53 PM   Specimen: Nasopharyngeal Swab  Result Value Ref Range Status   SARS Coronavirus 2 NEGATIVE NEGATIVE Final    Comment: (NOTE) SARS-CoV-2 target nucleic acids are NOT DETECTED. The SARS-CoV-2 RNA is generally detectable in upper and lower respiratory specimens during the acute phase of infection. Negative results do not preclude  SARS-CoV-2 infection, do not rule out co-infections with other pathogens, and should not be used as the sole basis for treatment or other patient management decisions. Negative results must be combined with clinical observations, patient history, and epidemiological information. The expected result is Negative. Fact Sheet for Patients: SugarRoll.be Fact Sheet for Healthcare Providers: https://www.woods-mathews.com/ This test is not yet approved or cleared by the [place] FDA and  has been authorized for detection and/or diagnosis of SARS-CoV-2 by FDA under an Emergency Use Authorization (EUA). This EUA will remain  in effect (meaning this test can be used) for the duration  of the COVID-19 declaration under Section 56 4(b)(1) of the Act, 21 U.S.C. section 360bbb-3(b)(1), unless the authorization is terminated or revoked sooner. Performed at [facility], [place] [address], [place], [place] [address]      Studies: No results found.  Scheduled Meds: . azelastine  1 spray Each Nare BID  . ezetimibe  10 mg Oral Daily  . fluticasone  1 spray Each Nare Daily  . levothyroxine  100 mcg Oral QAC breakfast  . montelukast  10 mg Oral QHS  . pantoprazole  40 mg Oral BID  . pravastatin  40 mg Oral q1800  . vitamin B-12  1,000 mcg Oral Daily    Continuous Infusions: . lactated ringers 50 mL/hr at 06/05/19 0746     Time spent: 24mns I have personally reviewed and interpreted on  06/05/2019 daily labs,  imagings as discussed above under date review session and assessment and plans.  I reviewed all nursing notes, pharmacy notes, consultant notes,  vitals, pertinent old records  I have discussed plan of care as described above with RN , patient  on 06/05/2019   FFlorencia ReasonsMD, PhD, FACP  [facility]  Available via Epic secure chat 7am-7pm for nonurgent issues Please page for urgent issues, pager number available through [email or web address]  .   06/05/2019, 4:30 PM  LOS: 2 days

## 2019-06-06 LAB — CBC WITH DIFFERENTIAL/PLATELET
Abs Immature Granulocytes: 0 10*3/uL (ref 0.00–0.07)
Basophils Absolute: 0 10*3/uL (ref 0.0–0.1)
Basophils Relative: 1 %
Eosinophils Absolute: 0.1 10*3/uL (ref 0.0–0.5)
Eosinophils Relative: 2 %
HCT: 31.9 % — ABNORMAL LOW (ref 39.0–52.0)
Hemoglobin: 10.5 g/dL — ABNORMAL LOW (ref 13.0–17.0)
Immature Granulocytes: 0 %
Lymphocytes Relative: 17 %
Lymphs Abs: 0.7 10*3/uL (ref 0.7–4.0)
MCH: 30.3 pg (ref 26.0–34.0)
MCHC: 32.9 g/dL (ref 30.0–36.0)
MCV: 92.2 fL (ref 80.0–100.0)
Monocytes Absolute: 0.4 10*3/uL (ref 0.1–1.0)
Monocytes Relative: 9 %
Neutro Abs: 2.9 10*3/uL (ref 1.7–7.7)
Neutrophils Relative %: 71 %
Platelets: 181 10*3/uL (ref 150–400)
RBC: 3.46 MIL/uL — ABNORMAL LOW (ref 4.22–5.81)
RDW: 13.6 % (ref 11.5–15.5)
WBC: 4 10*3/uL (ref 4.0–10.5)
nRBC: 0 % (ref 0.0–0.2)

## 2019-06-06 LAB — COMPREHENSIVE METABOLIC PANEL
ALT: 20 U/L (ref 0–44)
AST: 20 U/L (ref 15–41)
Albumin: 3.2 g/dL — ABNORMAL LOW (ref 3.5–5.0)
Alkaline Phosphatase: 59 U/L (ref 38–126)
Anion gap: 11 (ref 5–15)
BUN: 14 mg/dL (ref 8–23)
CO2: 22 mmol/L (ref 22–32)
Calcium: 8.7 mg/dL — ABNORMAL LOW (ref 8.9–10.3)
Chloride: 108 mmol/L (ref 98–111)
Creatinine, Ser: 1.48 mg/dL — ABNORMAL HIGH (ref 0.61–1.24)
GFR calc Af Amer: 51 mL/min — ABNORMAL LOW (ref >60)
GFR calc non Af Amer: 44 mL/min — ABNORMAL LOW (ref >60)
Glucose, Bld: 153 mg/dL — ABNORMAL HIGH (ref 70–99)
Potassium: 3.5 mmol/L (ref 3.5–5.1)
Sodium: 141 mmol/L (ref 135–145)
Total Bilirubin: 0.8 mg/dL (ref 0.3–1.2)
Total Protein: 5.3 g/dL — ABNORMAL LOW (ref 6.5–8.1)

## 2019-06-06 MED ORDER — ASPIRIN EC 81 MG PO TBEC: 81.0000 mg | DELAYED_RELEASE_TABLET | Freq: Every day | ORAL | 3 refills | Status: DC

## 2019-06-06 NOTE — Progress Notes (Signed)
DISCHARGE NOTE HOME AVARI SWANK to be discharged Home per MD order. Discussed prescriptions and follow up appointments with the patient. Prescriptions given to patient; medication list explained in detail. Patient verbalized understanding.  Skin clean, dry and intact without evidence of skin break down, no evidence of skin tears noted. IV catheter discontinued intact. Site without signs and symptoms of complications. Dressing and pressure applied. Pt denies pain at the site currently. No complaints noted.  Patient free of lines, drains, and wounds.   An After Visit Summary (AVS) was printed and given to the patient. Patient escorted via wheelchair, and discharged home via private auto.  Arlyss Repress, RN

## 2019-06-06 NOTE — Discharge Summary (Signed)
Discharge Summary  Daniel Herrera [id number] DOB: 1941-03-02  PCP: [name], NP  Admit date: 06/03/2019 Discharge date: 06/06/2019  Time spent: 67mins  Recommendations for Outpatient Follow-up:  1. F/u with PCP within a week  for hospital discharge follow up, repeat cbc/bmp at follow up.  PCP to monitor anemia and renal function.  PCP to monitor blood glucose as well. 2. Follow-up with gastroenterology Dr. Lyndel Safe 3. Patient is instructed to continue to hold aspirin, resume on Monday if no more bleeding.  Avoid NSAIDs.  Discharge Diagnoses:  Active Hospital Problems   Diagnosis Date Noted  . Lower GI bleed 06/03/2019  . GI bleed 12/21/2017    Resolved Hospital Problems  No resolved problems to display.    Discharge Condition: stable  Diet recommendation: heart healthy  Filed Weights   06/03/19 1402 06/05/19 2035  Weight: 95 kg 91.1 kg    History of present illness: (Per admitting MD Dr. Roosevelt Locks)  PCP: [name], NP   Patient coming from: Home  I have personally briefly reviewed patient's old medical records in [facility]  Chief Complaint: Rectal bleed  HPI: Daniel Herrera is a 79 y.o. male with medical history significant of recurrent lower GI bleed secondary to alkalosis,CAD status post CABG, hypertension, hyperlipidemia, hypothyroidism, gastritis, hiatal hernia, colon polyp, presented to the ED for evaluation of rectal bleeding.Patient was just discharged from hospital last week for similar episodes, and on that occasion bleeding was able to stop spontaneously.  Remained stable until this morning, he woke up with 2 large right blood per rectum, Denies lightheadedness/dizziness, chest pain, palpitations, or shortness of breath. He takes a baby aspirin daily and no other blood thinners.  ED Course: Had 1 large bright red hematochezia, vital signs remained stable, hemoglobin level remained stable compared to last time. Hospital Course:  Active  Problems:   GI bleed   Lower GI bleed   Recurrent acute lower GI bleeding: Possibly from sigmoid diverticulosis. Patient has been seen by gastroenterologist who recommended conservative management for now.  No more bleeding, had a brown bowel movement this morning, hemoglobin stable, is cleared to discharge home per GI Per GI, nuclear RBC tagged scan orflex sig would be considered if bleeding recurs.  He is to follow-up with GI Dr. Lyndel Safe Home med aspirin on hold since admission, no more bleeding.  Acute blood loss anemia: Hemoglobin dropped from 12.2-10.05-14-08.5-10.5. No indication for blood transfusion at this time. PCP to repeat CBC at discharge follow-up.  AKI on CKD stage IIIa: Creatinine 1.67 on presentation, cr has improved , today 1.36, repeat bmp in am. ua unremarkable, start lr due to poor oral intake PCP to repeat BMP at hospital discharge follow-up.  Impaired fasting blood glucose, a1c 6.5 from  A year ago, likely has borderline diabetes, advised diet control Follow-up with PCP  CAD with history of CABG in 05/2018:s/p DEX x2 2016  Aspirin on hold, resume on Monday if no more bleeding  Continue statin, Denies chest pain Follow-up with cardiology Dr. Alvester Chou  Hypothyroidism: On Synthroid  H/o prostate cancer s/p XRT  H/o hereditary angioedema, no active issues.  DVT Prophylaxis:scd's  Code Status: scd  Family Communication: patient   Disposition Plan:    Patient came from:home  Anticipated d/c place:home with GI clearance    Consultants:  LB gi  Procedures:  none  Antibiotics:  none   Discharge Exam: BP 140/78 (BP Location: Left Arm)   Pulse 82   Temp 97.8 F (  36.6 C) (Oral)   Resp 18   Ht 6\' 1"  (1.854 m)   Wt 91.1 kg   SpO2 97%   BMI 26.51 kg/m    General:  NAD, very pleasant  Cardiovascular: RRR  Respiratory:  CTABL  Abdomen: Soft/ND/NT, positive BS  Musculoskeletal: No Edema  Neuro: alert, oriented    Discharge Instructions You were cared for by a hospitalist during your hospital stay. If you have any questions about your discharge medications or the care you received while you were in the hospital after you are discharged, you can call the unit and asked to speak with the hospitalist on call if the hospitalist that took care of you is not available. Once you are discharged, your primary care physician will handle any further medical issues. Please note that NO REFILLS for any discharge medications will be authorized once you are discharged, as it is imperative that you return to your primary care physician (or establish a relationship with a primary care physician if you do not have one) for your aftercare needs so that they can reassess your need for medications and monitor your lab values.  Discharge Instructions    Diet - low sodium heart healthy   Complete by: As directed    Diet - low sodium heart healthy   Complete by: As directed    Carb modified   Increase activity slowly   Complete by: As directed    Increase activity slowly   Complete by: As directed      Allergies as of 06/06/2019      Reactions   Lisinopril Anaphylaxis, Swelling, Other (See Comments)   Face, lips, and throat swell   Tape Other (See Comments)   PLASTIC TAPE TEARS OFF THE SKIN- Please use an alternative!!   Benadryl [diphenhydramine Hcl] Swelling, Other (See Comments)   Facial swelling from high dose (tolerates Advil PM as needed)   Darvocet [propoxyphene N-acetaminophen] Other (See Comments)   Hallucinations   Darvon [propoxyphene Hcl] Other (See Comments)   Hallucination   Tylenol [acetaminophen] Swelling, Other (See Comments)   Foot swelling      Medication List    STOP taking these medications   nabumetone 750 MG tablet Commonly known as: RELAFEN     TAKE these medications   aspirin EC 81 MG  tablet Take 1 tablet (81 mg total) by mouth daily. Resume on 06/09/19 What changed: additional instructions   azelastine 0.1 % nasal spray Commonly known as: ASTELIN Place 1 spray into both nostrils 2 (two) times daily. What changed:   when to take this  reasons to take this   baclofen 10 MG tablet Commonly known as: LIORESAL Take 0.5-1 tablets (5-10 mg total) by mouth 3 (three) times daily as needed for muscle spasms.   dextromethorphan-guaiFENesin 30-600 MG 12hr tablet Commonly known as: MUCINEX DM Take 1 tablet by mouth in the morning and at bedtime.   EPINEPHrine 0.3 mg/0.3 mL Soaj injection Commonly known as: EPI-PEN INJECT 0.3 ML(0.3MG ) INTO THE MUSCLE AS NEEDED FOR ALLERGIC REACTION What changed: See the new instructions.   ezetimibe 10 MG tablet Commonly known as: ZETIA Take 1 tablet (10 mg total) by mouth daily. What changed: when to take this   fluticasone 50 MCG/ACT nasal spray Commonly known as: FLONASE SHAKE LIQUID AND USE 1 SPRAY IN EACH NOSTRIL DAILY What changed: See the new instructions.   icatibant 30 MG/3ML injection Commonly known as: FIRAZYR Inject 30 mg into the skin once as needed (for  sudden attacks of hereditary angioedema).   levothyroxine 100 MCG tablet Commonly known as: SYNTHROID Take 100 mcg by mouth daily before breakfast.   Livalo 4 MG Tabs Generic drug: Pitavastatin Calcium Take 4 mg by mouth at bedtime.   metoprolol tartrate 25 MG tablet Commonly known as: LOPRESSOR Take 0.5 tablets (12.5 mg total) by mouth 2 (two) times daily.   montelukast 10 MG tablet Commonly known as: SINGULAIR Take 1 tablet (10 mg total) by mouth at bedtime.   nitroGLYCERIN 0.4 MG SL tablet Commonly known as: NITROSTAT DISSOLVE 1 TABLET UNDER TONGUE EVERY 5 MINUTES FOR 3 DOSES AS NEEDED CHEST PAIN. IF NO RELIEF CALL 911 What changed: See the new instructions.   NONFORMULARY OR COMPOUNDED ITEM Use as directed 5-15 mLs in the mouth or throat See admin  instructions. Compounded mouth wash/gargle:1/1/1/1/1 (80 ml's of each) Purified water; Lidocaine Viscous; Nystatin suspension; Prednisolone 15/5; Maalox: Swish and spit using 5-15 ml's every 6 hours as needed for irritation   OVER THE COUNTER MEDICATION Take 2 capsules by mouth See admin instructions. Health Plus/Super Colon Cleanse: Take 2 capsules by mouth in the morning   pantoprazole 40 MG tablet Commonly known as: PROTONIX TAKE 1 TABLET(40 MG) BY MOUTH TWICE DAILY What changed: See the new instructions.   Takhzyro 300 MG/2ML Soln Generic drug: Lanadelumab-flyo Inject 300 mg into the muscle every 14 (fourteen) days.   Vitamin B-12 5000 MCG Tbdp Take 5,000 mcg by mouth daily.      Allergies  Allergen Reactions  . Lisinopril Anaphylaxis, Swelling and Other (See Comments)    Face, lips, and throat swell  . Tape Other (See Comments)    PLASTIC TAPE TEARS OFF THE SKIN- Please use an alternative!!  . Benadryl [Diphenhydramine Hcl] Swelling and Other (See Comments)    Facial swelling from high dose (tolerates Advil PM as needed)  . Darvocet [Propoxyphene N-Acetaminophen] Other (See Comments)    Hallucinations   . Darvon [Propoxyphene Hcl] Other (See Comments)    Hallucination   . Tylenol [Acetaminophen] Swelling and Other (See Comments)    Foot swelling   Follow-up Information    [name], [name], NP Follow up in 1 week(s).   Specialty: Internal Medicine Why: Hospital discharge follow-up, repeat CBC and BMP at follow-up.  Monitor anemia and kidney function. Contact information: [place] [address] [id number]        Lorretta Harp, MD .   Specialties: Cardiology, Radiology Contact information: [address] [place] [address] [phone]        Jackquline Denmark, MD Follow up.   Specialties: Gastroenterology, Internal Medicine Contact information: [place]  [id number] [phone]            The results of significant diagnostics from this hospitalization (including imaging, microbiology, ancillary and laboratory) are listed below for reference.    Significant Diagnostic Studies: CT Chest High Resolution  Result Date: 05/19/2019 CLINICAL DATA:  79 year old male with history of persistent crackles in the lungs. Evaluate for interstitial lung disease. EXAM: CT CHEST WITHOUT CONTRAST TECHNIQUE: Multidetector CT imaging of the chest was performed following the standard protocol without intravenous contrast. High resolution imaging of the lungs, as well as inspiratory and expiratory imaging, was performed. COMPARISON:  No priors. FINDINGS: Cardiovascular: Heart size is normal. There is no significant pericardial fluid, thickening or pericardial calcification. There is aortic atherosclerosis, as well as atherosclerosis of the great vessels of the  mediastinum and the coronary arteries, including calcified atherosclerotic plaque in the left anterior descending, left circumflex and right coronary arteries. Status post median sternotomy for CABG including LIMA to the LAD. Mediastinum/Nodes: No pathologically enlarged mediastinal or hilar lymph nodes. Please note that accurate exclusion of hilar adenopathy is limited on noncontrast CT scans. Small to moderate hiatal hernia. No axillary lymphadenopathy. Lungs/Pleura: High-resolution images demonstrate patchy areas of ground-glass attenuation, peripheral predominant septal thickening, very mild subpleural reticulation and peripheral traction bronchiectasis. Findings have a definitive craniocaudal gradient, most severe in the extreme lung bases. No definite honeycombing. Inspiratory and expiratory imaging is unremarkable. No acute consolidative airspace disease. No pleural effusions. No definite suspicious appearing pulmonary nodules or masses are noted. Bilateral apical pleuroparenchymal thickening and architectural  distortion, most compatible with chronic post infectious or inflammatory scarring. Upper Abdomen: High attenuation material lying dependently in the gallbladder may represent noncalcified gallstones and/or biliary sludge. Incompletely imaged calculus in the upper pole collecting system of the right kidney measuring at least 3 mm. Multiple parapelvic cysts in the right kidney, similar to prior CT the abdomen and pelvis 12/21/2017. Musculoskeletal: Median sternotomy wires. There are no aggressive appearing lytic or blastic lesions noted in the visualized portions of the skeleton. IMPRESSION: 1. The appearance of the lungs is compatible with interstitial lung disease, with a spectrum of findings considered probable usual interstitial pneumonia (UIP) per current ATS guidelines. Repeat high-resolution chest CT is recommended in 12 months to assess for temporal changes in the appearance of the lung parenchyma. 2. Aortic atherosclerosis, in addition to three-vessel coronary artery disease. Status post median sternotomy for CABG including LIMA to the LAD. 3. Nonobstructive calculus measuring at least 3 mm in the upper pole collecting system of the right kidney. 4. Cholelithiasis versus biliary sludge lying dependently in the gallbladder. Aortic Atherosclerosis (ICD10-I70.0). Electronically Signed   By: Vinnie Langton M.D.   On: 05/19/2019 10:32    Microbiology: Recent Results (from the past 240 hour(s))  SARS CORONAVIRUS 2 (TAT 6-24 HRS) Nasopharyngeal Nasopharyngeal Swab     Status: None   Collection Time: 05/28/19  5:48 AM   Specimen: Nasopharyngeal Swab  Result Value Ref Range Status   SARS Coronavirus 2 NEGATIVE NEGATIVE Final    Comment: (NOTE) SARS-CoV-2 target nucleic acids are NOT DETECTED. The SARS-CoV-2 RNA is generally detectable in upper and lower respiratory specimens during the acute phase of infection. Negative results do not preclude SARS-CoV-2 infection, do not rule out co-infections with  other pathogens, and should not be used as the sole basis for treatment or other patient management decisions. Negative results must be combined with clinical observations, patient history, and epidemiological information. The expected result is Negative. Fact Sheet for Patients: SugarRoll.be Fact Sheet for Healthcare Providers: https://www.woods-mathews.com/ This test is not yet approved or cleared by the [place] FDA and  has been authorized for detection and/or diagnosis of SARS-CoV-2 by FDA under an Emergency Use Authorization (EUA). This EUA will remain  in effect (meaning this test can be used) for the duration of the COVID-19 declaration under Section 56 4(b)(1) of the Act, 21 U.S.C. section 360bbb-3(b)(1), unless the authorization is terminated or revoked sooner. Performed at [facility], [place] [address] [place], [place] [address]   SARS CORONAVIRUS 2 (TAT 6-24 HRS) Nasopharyngeal Nasopharyngeal Swab     Status: None   Collection Time: 06/03/19  5:53 PM   Specimen: Nasopharyngeal Swab  Result Value Ref Range Status   SARS Coronavirus 2 NEGATIVE NEGATIVE Final  Comment: (NOTE) SARS-CoV-2 target nucleic acids are NOT DETECTED. The SARS-CoV-2 RNA is generally detectable in upper and lower respiratory specimens during the acute phase of infection. Negative results do not preclude SARS-CoV-2 infection, do not rule out co-infections with other pathogens, and should not be used as the sole basis for treatment or other patient management decisions. Negative results must be combined with clinical observations, patient history, and epidemiological information. The expected result is Negative. Fact Sheet for Patients: SugarRoll.be Fact Sheet for Healthcare Providers: https://www.woods-mathews.com/ This test is not yet approved or cleared by the [place] FDA and  has been authorized  for detection and/or diagnosis of SARS-CoV-2 by FDA under an Emergency Use Authorization (EUA). This EUA will remain  in effect (meaning this test can be used) for the duration of the COVID-19 declaration under Section 56 4(b)(1) of the Act, 21 U.S.C. section 360bbb-3(b)(1), unless the authorization is terminated or revoked sooner. Performed at [facility], [place] [address], [place], [place] [address]      Labs: Basic Metabolic Panel: Recent Labs  Lab 06/03/19 1409 06/05/19 0933 06/06/19 0613  NA 142 139 141  K 3.7 3.5 3.5  CL 110 110 108  CO2 21* 19* 22  GLUCOSE 120* 162* 153*  BUN 20 13 14   CREATININE 1.67* 1.36* 1.48*  CALCIUM 9.2 8.6* 8.7*   Liver Function Tests: Recent Labs  Lab 06/03/19 1409 06/06/19 0613  AST 21 20  ALT 18 20  ALKPHOS 63 59  BILITOT 1.3* 0.8  PROT 6.1* 5.3*  ALBUMIN 3.7 3.2*   No results for input(s): LIPASE, AMYLASE in the last 168 hours. No results for input(s): AMMONIA in the last 168 hours. CBC: Recent Labs  Lab 06/03/19 2226 06/04/19 0520 06/04/19 1703 06/05/19 0933 06/06/19 0613  WBC 4.6 3.5* 4.6 3.5* 4.0  NEUTROABS  --   --   --   --  2.9  HGB 11.3* 10.2* 11.1* 10.5* 10.5*  HCT 33.6* 30.3* 32.9* 30.9* 31.9*  MCV 91.8 91.8 91.1 90.6 92.2  PLT 194 163 176 158 181   Cardiac Enzymes: No results for input(s): CKTOTAL, CKMB, CKMBINDEX, TROPONINI in the last 168 hours. BNP: BNP (last 3 results) No results for input(s): BNP in the last 8760 hours.  ProBNP (last 3 results) No results for input(s): PROBNP in the last 8760 hours.  CBG: No results for input(s): GLUCAP in the last 168 hours.     Signed:  Florencia Reasons MD, PhD, FACP  [facility] 06/06/2019, 10:52 AM

## 2019-06-09 NOTE — Telephone Encounter (Signed)
Called and spoke with patient-patient reports he is "Doing good"; patient has been scheduled for a f/u appt on 06/23/2019 at 1:30 pm; Patient advised to call back to the office at [phone] should questions/concerns arise;  Patient verbalized understanding of information/instructions;

## 2019-06-10 ENCOUNTER — Ambulatory Visit (INDEPENDENT_AMBULATORY_CARE_PROVIDER_SITE_OTHER): Payer: Medicare Other | Admitting: Internal Medicine

## 2019-06-12 ENCOUNTER — Encounter: Payer: Self-pay | Admitting: Gastroenterology

## 2019-06-12 DIAGNOSIS — Z6828 Body mass index (BMI) 28.0-28.9, adult: Secondary | ICD-10-CM | POA: Diagnosis not present

## 2019-06-18 DIAGNOSIS — K5731 Diverticulosis of large intestine without perforation or abscess with bleeding: Secondary | ICD-10-CM

## 2019-06-19 ENCOUNTER — Ambulatory Visit: Payer: Medicare Other

## 2019-06-23 ENCOUNTER — Other Ambulatory Visit: Payer: Self-pay

## 2019-06-23 ENCOUNTER — Encounter: Payer: Self-pay | Admitting: Gastroenterology

## 2019-06-23 ENCOUNTER — Ambulatory Visit (INDEPENDENT_AMBULATORY_CARE_PROVIDER_SITE_OTHER): Payer: Medicare Other | Admitting: Gastroenterology

## 2019-06-23 VITALS — BP 132/76 | HR 74 | Temp 97.3°F | Ht 73.0 in | Wt 205.0 lb

## 2019-06-23 DIAGNOSIS — K5731 Diverticulosis of large intestine without perforation or abscess with bleeding: Secondary | ICD-10-CM | POA: Diagnosis not present

## 2019-06-23 MED ORDER — HEMOCYTE-PLUS 106-1 MG PO TABS: 1.0000 | ORAL_TABLET | Freq: Every day | ORAL | 0 refills | Status: AC

## 2019-06-23 NOTE — Progress Notes (Signed)
IMPRESSION and PLAN:    #1. Rectal bleeding -resolved. (likely due to diverticular bleed, taken off plavix by cardiology in past). Neg colon 01/2019, 07/2012 for diverticular disease. #2. GERD with possible extra esophageal manifestations of reflux. Had ENT eval by Dr Elyse Jarvis and allergy evaluation by Dr. Neldon Mc     Plan: - CBC, CMP results from [facility]. - If any recurrent bleeding, CTA AP - Start iron supplement. Hemocyte plus 1 tab po qd x 4 weeks. - Continue probiotics. - Can decrease Protonix 40mg  po qd. - FU with Amy (has appt). Recheck CBC, CMP at that time - FU PRN  HPI:    Chief Complaint:   Daniel Herrera is a 79 y.o. male  For follow-up visit. ED 2/23 and then admitted 3/2 with painless hematochezia Hemoglobin remained stable He did not require CT or repeat colonoscopy.  Dx with ILD on basis of CT chest.  Hemoglobin has been stable at 10.5-10.6.  No further bleeding ever since discharge.  Feels much better.  He would normally have diarrhea but lately has been having normal bowel movements.  Previous records reviewed: -Colonoscopy 01/31/2019- 13mm colonic polyp s/p polypectomy, pancolonic diverticulosis (Bx- TA), 07/11/2012: Pancolonic diverticulosis predominantly in the left colon, small internal hemorrhoids.  Otherwise normal colonoscopy.  No radiation-induced changes in the rectum. -EGD 02/01/2019 neg see report in procedures tab.   Past Medical History:  Diagnosis Date  . Angio-edema   . Anxiety   . Arthritis   . CAD (coronary artery disease)    a. 02/2015: DES to mid-LAD  . Cataract   . Geographic tongue   . GERD (gastroesophageal reflux disease)   . Gout   . High cholesterol   . History of prolonged Q-T interval on ECG   . Hx of Clostridium difficile infection   . Hx of umbilical hernia repair   . Hypertension   . Myocardial infarction ([place])   . Personal history of digestive disease    gastric ulcer  . Prostate cancer ([employer])   . Prostate  enlargement   . Skin cancer    basal cell carcinoma  . Thyroid disease   . Urticaria     Current Outpatient Medications  Medication Sig Dispense Refill  . aspirin EC 81 MG tablet Take 1 tablet (81 mg total) by mouth daily. Resume on 06/09/19 90 tablet 3  . azelastine (ASTELIN) 0.1 % nasal spray Place 1 spray into both nostrils 2 (two) times daily. (Patient taking differently: Place 1 spray into both nostrils 2 (two) times daily as needed for rhinitis or allergies. ) 30 mL 5  . Cyanocobalamin (VITAMIN B-12) 5000 MCG TBDP Take 5,000 mcg by mouth daily.     [name] dextromethorphan-guaiFENesin (MUCINEX DM) 30-600 MG 12hr tablet Take 1 tablet by mouth in the morning and at bedtime.    [name] EPINEPHRINE 0.3 mg/0.3 mL IJ SOAJ injection INJECT 0.3 ML(0.3MG ) INTO THE MUSCLE AS NEEDED FOR ALLERGIC REACTION (Patient taking differently: Inject 0.3 mg into the muscle once as needed for anaphylaxis (or as directed for severe allergic reaction). ) 2 each 1  . ezetimibe (ZETIA) 10 MG tablet Take 1 tablet (10 mg total) by mouth daily. (Patient taking differently: Take 10 mg by mouth at bedtime. ) 90 tablet 3  . fluticasone (FLONASE) 50 MCG/ACT nasal spray SHAKE LIQUID AND USE 1 SPRAY IN EACH NOSTRIL DAILY (Patient taking differently: Place 2 sprays into both nostrils daily. ) 16 g 5  . icatibant (FIRAZYR)  30 MG/3ML injection Inject 30 mg into the skin once as needed (for sudden attacks of hereditary angioedema).     [name] levothyroxine (SYNTHROID) 100 MCG tablet Take 100 mcg by mouth daily before breakfast.    . metoprolol tartrate (LOPRESSOR) 25 MG tablet Take 0.5 tablets (12.5 mg total) by mouth 2 (two) times daily. 90 tablet 6  . montelukast (SINGULAIR) 10 MG tablet Take 1 tablet (10 mg total) by mouth at bedtime. 30 tablet 5  . nitroGLYCERIN (NITROSTAT) 0.4 MG SL tablet DISSOLVE 1 TABLET UNDER TONGUE EVERY 5 MINUTES FOR 3 DOSES AS NEEDED CHEST PAIN. IF NO RELIEF CALL 911 (Patient taking differently: Place 0.4 mg under the  tongue every 5 (five) minutes as needed for chest pain. ) 75 tablet 2  . NONFORMULARY OR COMPOUNDED ITEM Use as directed 5-15 mLs in the mouth or throat See admin instructions. Compounded mouth wash/gargle:1/1/1/1/1 (80 ml's of each) Purified water; Lidocaine Viscous; Nystatin suspension; Prednisolone 15/5; Maalox: Swish and spit using 5-15 ml's every 6 hours as needed for irritation    . OVER THE COUNTER MEDICATION Take 2 capsules by mouth See admin instructions. Health Plus/Super Colon Cleanse: Take 2 capsules by mouth in the morning    . pantoprazole (PROTONIX) 40 MG tablet TAKE 1 TABLET(40 MG) BY MOUTH TWICE DAILY (Patient taking differently: Take 40 mg by mouth 2 (two) times daily. ) 60 tablet 5  . Pitavastatin Calcium (LIVALO) 4 MG TABS Take 4 mg by mouth at bedtime.     [name] TAKHZYRO 300 MG/2ML SOLN Inject 300 mg into the muscle every 14 (fourteen) days.     . baclofen (LIORESAL) 10 MG tablet Take 0.5-1 tablets (5-10 mg total) by mouth 3 (three) times daily as needed for muscle spasms. (Patient not taking: Reported on 06/23/2019) 30 each 3   No current facility-administered medications for this visit.    Past Surgical History:  Procedure Laterality Date  . APPENDECTOMY    . BACK SURGERY    . CARDIAC CATHETERIZATION N/A 02/21/2015   Procedure: Left Heart Cath;  Surgeon: Lorretta Harp, MD;  Location: [place] [facility];  Service: Cardiovascular;  Laterality: N/A;  . CATARACT EXTRACTION, BILATERAL    . CORONARY ARTERY BYPASS GRAFT N/A 05/24/2018   Procedure: CORONARY ARTERY BYPASS GRAFTING (CABG), ON PUMP, TIMES THREE, USING BILATERAL INTERNAL MAMMARY ARTERY AND HARVESTED LEFT RADIAL ARTERY;  Surgeon: Gaye Pollack, MD;  Location: [place];  Service: Open Heart Surgery;  Laterality: N/A;  . LEFT HEART CATH AND CORONARY ANGIOGRAPHY N/A 05/22/2018   Procedure: LEFT HEART CATH AND CORONARY ANGIOGRAPHY;  Surgeon: Jettie Booze, MD;  Location: [place] [facility];  Service: Cardiovascular;   Laterality: N/A;  . NASAL SINUS SURGERY    . PROSTATE BIOPSY    . RADIAL ARTERY HARVEST Left 05/24/2018   Procedure: RADIAL ARTERY HARVEST;  Surgeon: Gaye Pollack, MD;  Location: [place];  Service: Open Heart Surgery;  Laterality: Left;  . right shoulder rotator cuff repair    . TEE WITHOUT CARDIOVERSION N/A 05/24/2018   Procedure: TRANSESOPHAGEAL ECHOCARDIOGRAM (TEE);  Surgeon: Gaye Pollack, MD;  Location: [place];  Service: Open Heart Surgery;  Laterality: N/A;    Family History  Problem Relation Age of Onset  . Cancer Mother   . Stomach cancer Mother   . Cancer Father        prostate  . Cancer Sister     Social History   Tobacco Use  . Smoking status:  Never Smoker  . Smokeless tobacco: Never Used  Substance Use Topics  . Alcohol use: Never  . Drug use: No    Allergies  Allergen Reactions  . Lisinopril Anaphylaxis, Swelling and Other (See Comments)    Face, lips, and throat swell  . Tape Other (See Comments)    PLASTIC TAPE TEARS OFF THE SKIN- Please use an alternative!!  . Benadryl [Diphenhydramine Hcl] Swelling and Other (See Comments)    Facial swelling from high dose (tolerates Advil PM as needed)  . Darvocet [Propoxyphene N-Acetaminophen] Other (See Comments)    Hallucinations   . Darvon [Propoxyphene Hcl] Other (See Comments)    Hallucination   . Tylenol [Acetaminophen] Swelling and Other (See Comments)    Foot swelling     Review of Systems: All systems reviewed and negative except where noted in HPI.    Physical Exam:     BP 132/76   Pulse 74   Temp (!) 97.3 F (36.3 C)   Ht 6\' 1"  (1.854 m)   Wt 205 lb (93 kg)   BMI 27.05 kg/m  GENERAL:  Alert, oriented, cooperative, not in acute distress. PSYCH: :Pleasant, normal mood and affect. HEENT:  conjunctiva pink, mucous membranes moist, neck supple without masses. No jaundice. CARDIAC:  S1 S2 normal. No murmers. PULM: Normal respiratory effort, lungs CTA bilaterally, no wheezing. ABDOMEN:  Inspection: No visible peristalsis, no abnormal pulsations, skin normal.  Palpation/percussion: Soft, nontender, nondistended, no rigidity, no abnormal dullness to percussion, no hepatosplenomegaly and no palpable abdominal masses.  Auscultation: Normal bowel sounds, no abdominal bruits. Rectal exam: Deferred SKIN:  turgor, no lesions seen. Musculoskeletal:  Normal muscle tone, normal strength. NEURO: Alert and oriented x 3, no focal neurologic deficits.   Data Reviewed: I have personally reviewed following labs and imaging studies CBC Latest Ref Rng & Units 06/06/2019 06/05/2019 06/04/2019  WBC 4.0 - 10.5 K/uL 4.0 3.5(L) 4.6  Hemoglobin 13.0 - 17.0 g/dL 10.5(L) 10.5(L) 11.1(L)  Hematocrit 39.0 - 52.0 % 31.9(L) 30.9(L) 32.9(L)  Platelets 150 - 400 K/uL 181 158 176   CMP Latest Ref Rng & Units 06/06/2019 06/05/2019 06/03/2019  Glucose 70 - 99 mg/dL 153(H) 162(H) 120(H)  BUN 8 - 23 mg/dL 14 13 20   Creatinine 0.61 - 1.24 mg/dL 1.48(H) 1.36(H) 1.67(H)  Sodium 135 - 145 mmol/L 141 139 142  Potassium 3.5 - 5.1 mmol/L 3.5 3.5 3.7  Chloride 98 - 111 mmol/L 108 110 110  CO2 22 - 32 mmol/L 22 19(L) 21(L)  Calcium 8.9 - 10.3 mg/dL 8.7(L) 8.6(L) 9.2  Total Protein 6.5 - 8.1 g/dL 5.3(L) - 6.1(L)  Total Bilirubin 0.3 - 1.2 mg/dL 0.8 - 1.3(H)  Alkaline Phos 38 - 126 U/L 59 - 63  AST 15 - 41 U/L 20 - 21  ALT 0 - 44 U/L 20 - 18   Extensive notes were reviewed    Jaeger Trueheart,MD 06/23/2019, 1:42 PM   CC [name], [name], NP

## 2019-06-23 NOTE — Patient Instructions (Signed)
If you are age 81 or older, your body mass index should be between 23-30. Your Body mass index is 27.05 kg/m. If this is out of the aforementioned range listed, please consider follow up with your Primary Care Provider.  If you are age 19 or younger, your body mass index should be between 19-25. Your Body mass index is 27.05 kg/m. If this is out of the aformentioned range listed, please consider follow up with your Primary Care Provider.   We have sent the following medications to your pharmacy for you to pick up at your convenience: Hemocyte Plus once daily x 4 weeks.   Continue Probiotics.   Recheck CBC and CMP at your appointment with [facility].   Follow up as needed.   Thank you,  Dr. Jackquline Denmark

## 2019-07-14 NOTE — Progress Notes (Signed)
Interstitial Lung Disease Multidisciplinary Conference   FITZ MATSUO    MRN [id number]    DOB [birth year]  Primary Care Physician:[name], [name], NP  Referring Physician: Dr Elsworth Soho  Time of Conference: 7.30am- 8.30am Date of conference: 06/10/19 Location of Conference: -  Virtual  Participating Pulmonary: Dr. Brand Males, MD - yes,  Dr Marshell Garfinkel, MD - yes Pathology: Dr Jaquita Folds, MD - no , Dr Enid Cutter - no Radiology: Dr Salvatore Marvel MD - no, Dr Vinnie Langton MD - yes,  Dr Lorin Picket, MD - no , Dr Eddie Candle - no Others: Ledell Noss  Brief History: 78 met by Davonna Belling few years ago. Farm and possible exposure. Then symptoms got better and PFT not bad and on expectant followup. Symptoms under control except mild dry cough.   Serology: Results for WORTHINGTON, CRUZAN (MRN [id number]) as of 07/14/2019 08:44  Ref. Range 12/17/2013 15:57 09/12/2017 10:34  Anti Nuclear Antibody (ANA) Latest Ref Range: Negative   Negative  Cyclic Citrullin Peptide Ab Latest Ref Range: 0.0 - 5.0 U/mL <2.0     MDD discussion of CT scan    - Date or time period of scan: feb 2021 without priors. Official read was probable UIP    - What is the final conclusion per 2018 ATS/Fleischner Criteria -  Radiology: No air trapping. Classification: CG +. Prbab UIP given TB. e. Trace maybe emphyema.  Pathology discussion of biopsy *- none present  PFTs: appear stable fvc but dlco declined over 5 years. Last PFT in Feb 2020 Results for JUDITH, CAMPILLO (MRN [id number]) as of 07/14/2019 08:44  Ref. Range 02/11/2014 12:46 05/23/2018 12:36  FVC-Pre Latest Units: L 4.37 4.07  FVC-%Pred-Pre Latest Units: % 96 88   Results for AMAURIE, WANDEL (MRN [id number]) as of 07/14/2019 08:44  Ref. Range 02/11/2014 12:46 05/23/2018 12:36  DLCO unc Latest Units: ml/min/mmHg 22.99 15.62  DLCO unc % pred Latest Units: % 65 58    Labs: x  MDD Impression/Recs: Clinical Imp:   1) Given age and negative serology  - this UIP/IPF given probable UIP on CT and progression with DLCO over time.   2) Rec repeat PFT   3) Rec conversation about anti fibrotic, +/- bx - balanced with ecog/age.            Time Spent in preparation and discussion:  > 30 min    SIGNATURE   Dr. Brand Males, M.D., F.C.C.P,  Pulmonary and Critical Care Medicine Staff Physician, [place] Director - Interstitial Lung Disease  Program  Pulmonary [place] [facility] [place], [place], [address]  Pager: [phone], If no answer or between  15:00h - 7:00h: call [phone] Telephone: [phone]  8:42 AM 07/14/2019 ...................................................................................................................[name] References: Diagnosis of Hypersensitivity Pneumonitis in Adults. An Official ATS/JRS/ALAT Clinical Practice Guideline. Ragu G et al, [place] Aug 1;202(3):e36-e69.       Diagnosis of Idiopathic Pulmonary Fibrosis. An Official ATS/ERS/JRS/ALAT Clinical Practice Guideline. Raghu G et al, [place]. 2018 Sep 1;198(5):e44-e68.   IPF Suspected   Histopath ology Pattern      UIP  Probable UIP  Indeterminate for  UIP  Alternative  diagnosis    UIP  IPF  IPF  IPF  Non-IPF dx   HRCT   Probabe UIP  IPF  IPF  IPF (Likely)**  Non-IPF dx  Pattern  Indeterminate for UIP  IPF  IPF (Likely)**  Indeterminate  for IPF**  Non-IPF dx    Alternative diagnosis  IPF (Likely)**/ non-IPF dx  Non-IPF dx  Non-IPF dx  Non-IPF dx     Idiopathic pulmonary fibrosis diagnosis based upon HRCT and Biopsy paterns.  ** IPF is the likely diagnosis when any of following features are present:  . Moderate-to-severe traction bronchiectasis/bronchiolectasis (defined as mild traction bronchiectasis/bronchiolectasis in four or more lobes including the lingual as a lobe, or moderate to severe traction  bronchiectasis in two or more lobes) in a man over age 40 years or in a woman over age 85 years . Extensive (>30%) reticulation on HRCT and an age >70 years  . Increased neutrophils and/or absence of lymphocytosis in BAL fluid  . Multidisciplinary discussion reaches a confident diagnosis of IPF.   **Indeterminate for IPF  . Without an adequate biopsy is unlikely to be IPF  . With an adequate biopsy may be reclassified to a more specific diagnosis after multidisciplinary discussion and/or additional consultation.   dx = diagnosis; HRCT = high-resolution computed tomography; IPF = idiopathic pulmonary fibrosis; UIP = usual interstitial pneumonia.

## 2019-08-01 ENCOUNTER — Other Ambulatory Visit
Admission: RE | Admit: 2019-08-01 | Discharge: 2019-08-01 | Disposition: A | Discharge status: Home / Self Care | Payer: Medicare Other | Source: Ambulatory Visit | Attending: Pulmonary Disease | Admitting: Pulmonary Disease

## 2019-08-01 DIAGNOSIS — Z01812 Encounter for preprocedural laboratory examination: Secondary | ICD-10-CM | POA: Insufficient documentation

## 2019-08-01 DIAGNOSIS — E1169 Type 2 diabetes mellitus with other specified complication: Secondary | ICD-10-CM | POA: Diagnosis not present

## 2019-08-01 DIAGNOSIS — E039 Hypothyroidism, unspecified: Secondary | ICD-10-CM | POA: Diagnosis not present

## 2019-08-01 DIAGNOSIS — Z20822 Contact with and (suspected) exposure to covid-19: Secondary | ICD-10-CM | POA: Insufficient documentation

## 2019-08-01 DIAGNOSIS — E785 Hyperlipidemia, unspecified: Secondary | ICD-10-CM | POA: Diagnosis not present

## 2019-08-01 DIAGNOSIS — Z6827 Body mass index (BMI) 27.0-27.9, adult: Secondary | ICD-10-CM | POA: Diagnosis not present

## 2019-08-01 DIAGNOSIS — I2510 Atherosclerotic heart disease of native coronary artery without angina pectoris: Secondary | ICD-10-CM | POA: Diagnosis not present

## 2019-08-01 LAB — SARS CORONAVIRUS 2 (TAT 6-24 HRS): SARS Coronavirus 2: NEGATIVE

## 2019-08-05 ENCOUNTER — Ambulatory Visit (INDEPENDENT_AMBULATORY_CARE_PROVIDER_SITE_OTHER): Payer: Medicare Other | Admitting: Pulmonary Disease

## 2019-08-05 ENCOUNTER — Other Ambulatory Visit: Payer: Self-pay

## 2019-08-05 DIAGNOSIS — J849 Interstitial pulmonary disease, unspecified: Secondary | ICD-10-CM | POA: Diagnosis not present

## 2019-08-05 LAB — PULMONARY FUNCTION TEST
DL/VA % pred: 85 %
DL/VA: 3.27 ml/min/mmHg/L
DLCO cor % pred: 73 %
DLCO cor: 19.11 ml/min/mmHg
DLCO unc % pred: 62 %
DLCO unc: 16.46 ml/min/mmHg
FEF 25-75 Post: 4.07 L/sec
FEF 25-75 Pre: 3.53 L/sec
FEF2575-%Change-Post: 15 %
FEF2575-%Pred-Post: 188 %
FEF2575-%Pred-Pre: 163 %
FEV1-%Change-Post: 2 %
FEV1-%Pred-Post: 110 %
FEV1-%Pred-Pre: 107 %
FEV1-Post: 3.49 L
FEV1-Pre: 3.39 L
FEV1FVC-%Change-Post: 0 %
FEV1FVC-%Pred-Pre: 113 %
FEV6-%Change-Post: 1 %
FEV6-%Pred-Post: 102 %
FEV6-%Pred-Pre: 101 %
FEV6-Post: 4.26 L
FEV6-Pre: 4.18 L
FEV6FVC-%Pred-Post: 106 %
FEV6FVC-%Pred-Pre: 106 %
FVC-%Change-Post: 1 %
FVC-%Pred-Post: 96 %
FVC-%Pred-Pre: 94 %
FVC-Post: 4.26 L
FVC-Pre: 4.18 L
Post FEV1/FVC ratio: 82 %
Post FEV6/FVC ratio: 100 %
Pre FEV1/FVC ratio: 81 %
Pre FEV6/FVC Ratio: 100 %
RV % pred: 80 %
RV: 2.26 L
TLC % pred: 87 %
TLC: 6.65 L

## 2019-08-05 NOTE — Progress Notes (Signed)
Full PFT performed today. °

## 2019-08-15 NOTE — Progress Notes (Signed)
Patient identification verified. Results of recent PFT reviewed.  Per Dr. Elsworth Soho, lung function is stable compared to 2020. Routine OV in June or July to discuss. I am sorry to note that he has had problems with GI bleeding.  Patient verbalized understanding of results and ongoing plan of care.

## 2019-08-19 ENCOUNTER — Telehealth: Payer: Self-pay | Admitting: Cardiovascular Disease

## 2019-08-19 NOTE — Telephone Encounter (Signed)
ok 

## 2019-08-19 NOTE — Telephone Encounter (Signed)
Patient would like to switch from Dr. Gwenlyn Found to Dr. Geraldo Pitter in [place].

## 2019-08-20 NOTE — Telephone Encounter (Signed)
Fine with me

## 2019-08-25 ENCOUNTER — Telehealth: Payer: Self-pay | Admitting: Cardiovascular Disease

## 2019-08-25 DIAGNOSIS — E785 Hyperlipidemia, unspecified: Secondary | ICD-10-CM

## 2019-08-25 NOTE — Telephone Encounter (Signed)
Yes liver lipid

## 2019-08-25 NOTE — Telephone Encounter (Signed)
Patient switched from Dr. Gwenlyn Found to Dr. Geraldo Pitter. He wants to make sure that his lab orders that were requested by Dr. Gwenlyn Found will be available for him to do at the [facility] office prior to his appt with Dr. Geraldo Pitter on 09/30/19. Please advise.

## 2019-08-25 NOTE — Telephone Encounter (Signed)
The patient called in wanting to know if Dr. Geraldo Pitter wanted a fasting lipid prior to his appointment with him on 09/30/19. He is switching from Dr. Gwenlyn Found to Dr. Geraldo Pitter.

## 2019-08-25 NOTE — Telephone Encounter (Signed)
Fasting lab orders have been placed. Left message on the patient's voicemail, per dpr.

## 2019-09-01 ENCOUNTER — Other Ambulatory Visit: Payer: Self-pay | Admitting: Allergy and Immunology

## 2019-09-02 ENCOUNTER — Other Ambulatory Visit: Payer: Self-pay | Admitting: Allergy and Immunology

## 2019-09-03 ENCOUNTER — Telehealth: Payer: Self-pay | Admitting: Cardiovascular Disease

## 2019-09-03 NOTE — Telephone Encounter (Signed)
I called patient 09/03/19 to schedule follow up visit from the recalls list. The patient didn't answer so I then left message for patient to return call in order to get that scheduled.

## 2019-09-03 NOTE — Telephone Encounter (Signed)
Spoke with patient 09/03/19 and the patient stated they were no longer being seen by Dr.Berry but now a patient at our [place] location with Dr. Geraldo Pitter

## 2019-09-18 DIAGNOSIS — H52203 Unspecified astigmatism, bilateral: Secondary | ICD-10-CM | POA: Diagnosis not present

## 2019-09-18 DIAGNOSIS — Z961 Presence of intraocular lens: Secondary | ICD-10-CM | POA: Diagnosis not present

## 2019-09-18 DIAGNOSIS — E119 Type 2 diabetes mellitus without complications: Secondary | ICD-10-CM | POA: Diagnosis not present

## 2019-09-18 DIAGNOSIS — H43813 Vitreous degeneration, bilateral: Secondary | ICD-10-CM | POA: Diagnosis not present

## 2019-09-29 ENCOUNTER — Encounter: Payer: Self-pay | Admitting: Pulmonary Disease

## 2019-09-29 ENCOUNTER — Ambulatory Visit (INDEPENDENT_AMBULATORY_CARE_PROVIDER_SITE_OTHER): Payer: Medicare Other | Admitting: Pulmonary Disease

## 2019-09-29 ENCOUNTER — Other Ambulatory Visit: Payer: Self-pay

## 2019-09-29 ENCOUNTER — Other Ambulatory Visit: Payer: Self-pay | Admitting: Allergy and Immunology

## 2019-09-29 DIAGNOSIS — J849 Interstitial pulmonary disease, unspecified: Secondary | ICD-10-CM | POA: Diagnosis not present

## 2019-09-29 NOTE — Patient Instructions (Signed)
You have pulmonary fibrosis , which is slightly worse from 2015 but stable compared to 2020  We discussed medications for fibrosis , both of which have GI side effects of nausea and diarrhea We decided to hold off  Call me if your breathing gets worse

## 2019-09-29 NOTE — Assessment & Plan Note (Signed)
Probable IPF,  slightly worse from 2015 but stable compared to 2020  We discussed medications for fibrosis , both of which have GI side effects of nausea and diarrhea We decided to hold off He has been very sensitive to medications and has had multiple side effects.  He is more interested in quality of life going forward. We discussed stepladder pattern of progression of IPF  Call me if your breathing gets worse

## 2019-09-29 NOTE — Progress Notes (Signed)
   Subjective:    Patient ID: Daniel Herrera, male    DOB: 10/30/1940, 79 y.o.   MRN: [id number]  HPI  15 yonever smoker for FU of interstitial pneumonia  Past medical history-includesprostate CA,C diff colitis and SVTs that stop with vagal maneuvers, angioedema, CABG 06/2018 chronic hoarseness for many years, ENT evaluation  negative  He presented in 2015 with dyspnea for a few months,PFTs were normal except for mildly decreased DLCO.CT chest at [facility] showed mild interstitial prominence. This was attributed to chicken farm exposure or GERD related pneumonitis  Breathing has been stable.  He continues to have hoarseness of voice. He was started on cholesterol medication by Dr. Gwenlyn Found which he did not tolerated all.  Underwent GI evaluation.  This experience has further strengthened his belief not to start any new medication  Significant tests/ events reviewed  HRCT chest 05/2019 -probable UIP, no definite honeycombing  CT chest without contrast 11/2013 showed interstitial prominence particularly apical with mild bronchiectasis, small patchy infiltrate was noted in the right lower lobe with a tiny effusion. ESR ,CCP neg  PFTs 09/2019 FVC stable at 94%, DLCO stable at 16.6/62% compared to 15.6 and 58% in 2020  PFTs 02/2014 - nml FEV1/ FVC & ratio, TLC 81%, DLCO 65%  Review of Systems Patient denies significant dyspnea,cough, hemoptysis,  chest pain, palpitations, pedal edema, orthopnea, paroxysmal nocturnal dyspnea, lightheadedness, nausea, vomiting, abdominal or  leg pains      Objective:   Physical Exam   Gen. Pleasant, well-nourished, elderly,in no distress ENT - no thrush, no pallor/icterus,no post nasal drip Neck: No JVD, no thyromegaly, no carotid bruits Lungs: no use of accessory muscles, no dullness to percussion, bibasal rales no rhonchi  Cardiovascular: Rhythm regular, heart sounds  normal, no murmurs or gallops, no peripheral edema Musculoskeletal: No  deformities, no cyanosis or clubbing          Assessment & Plan:

## 2019-09-30 ENCOUNTER — Encounter: Payer: Self-pay | Admitting: Cardiology

## 2019-09-30 ENCOUNTER — Ambulatory Visit (INDEPENDENT_AMBULATORY_CARE_PROVIDER_SITE_OTHER): Payer: Medicare Other | Admitting: Cardiology

## 2019-09-30 VITALS — BP 140/80 | HR 68 | Ht 73.0 in | Wt 199.0 lb

## 2019-09-30 DIAGNOSIS — I2510 Atherosclerotic heart disease of native coronary artery without angina pectoris: Secondary | ICD-10-CM

## 2019-09-30 DIAGNOSIS — I10 Essential (primary) hypertension: Secondary | ICD-10-CM

## 2019-09-30 DIAGNOSIS — E785 Hyperlipidemia, unspecified: Secondary | ICD-10-CM | POA: Diagnosis not present

## 2019-09-30 DIAGNOSIS — Z951 Presence of aortocoronary bypass graft: Secondary | ICD-10-CM

## 2019-09-30 DIAGNOSIS — E118 Type 2 diabetes mellitus with unspecified complications: Secondary | ICD-10-CM

## 2019-09-30 NOTE — Progress Notes (Signed)
Cardiology Office Note:    Date:  09/30/2019   ID:  NIXON KOLTON, DOB 08-09-1940, MRN [id number]  PCP:  [name], NP  Cardiologist:  Jenean Lindau, MD   Referring MD: [name], NP    ASSESSMENT:    1. Coronary artery disease involving native coronary artery of native heart without angina pectoris   2. Dyslipidemia, goal LDL below 70   3. Essential hypertension   4. S/P CABG x 3   5. Type 2 diabetes mellitus with unspecified complications (HCC)    PLAN:    In order of problems listed above:  1. Coronary artery disease: Secondary prevention stressed with the patient.  Importance of compliance with diet medication stressed and he vocalized understanding.  He has excellent effort tolerance. 2. Essential hypertension: Blood pressure is stable and diet was emphasized 3. Mixed dyslipidemia: Lipids were reviewed from primary care physician's KPN sheet and lipids are fine and I encouraged him to continue current lifestyle. 4. Idiopathic pulmonary fibrosis: I reviewed pulmonology note from yesterday and discussed with the patient at length and questions were answered to his satisfaction. 5. Patient will be seen in follow-up appointment in 6 months or earlier if the patient has any concerns    Medication Adjustments/Labs and Tests Ordered: Current medicines are reviewed at length with the patient today.  Concerns regarding medicines are outlined above.  No orders of the defined types were placed in this encounter.  No orders of the defined types were placed in this encounter.    No chief complaint on file.    History of Present Illness:    Daniel Herrera is a 79 y.o. male.  Patient has past medical history of coronary artery disease post stenting and bypass surgery.,  Essential hypertension dyslipidemia and idiopathic pulmonary fibrosis.  He is transferred his care from my partner Dr. Inez Catalina because of geographical issues and the fact that we are closer to him.  He  now dreads driving to [place] because of age issues.  He denies any chest pain orthopnea or PND.  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  No chest pain orthopnea or PND.  At the time of my evaluation, the patient is alert awake oriented and in no distress.  Past Medical History:  Diagnosis Date  . Angio-edema   . Anxiety   . Arthritis   . CAD (coronary artery disease)    a. 02/2015: DES to mid-LAD  . Cataract   . Geographic tongue   . GERD (gastroesophageal reflux disease)   . Gout   . High cholesterol   . History of prolonged Q-T interval on ECG   . Hx of Clostridium difficile infection   . Hx of umbilical hernia repair   . Hypertension   . Myocardial infarction ([place])   . Personal history of digestive disease    gastric ulcer  . Prostate cancer ([place])   . Prostate enlargement   . Skin cancer    basal cell carcinoma  . Thyroid disease   . Urticaria     Past Surgical History:  Procedure Laterality Date  . APPENDECTOMY    . BACK SURGERY    . CARDIAC CATHETERIZATION N/A 02/21/2015   Procedure: Left Heart Cath;  Surgeon: Lorretta Harp, MD;  Location: [facility];  Service: Cardiovascular;  Laterality: N/A;  . CATARACT EXTRACTION, BILATERAL    . CORONARY ARTERY BYPASS GRAFT N/A 05/24/2018  Procedure: CORONARY ARTERY BYPASS GRAFTING (CABG), ON PUMP, TIMES THREE, USING BILATERAL INTERNAL MAMMARY ARTERY AND HARVESTED LEFT RADIAL ARTERY;  Surgeon: Gaye Pollack, MD;  Location: [facility];  Service: Open Heart Surgery;  Laterality: N/A;  . LEFT HEART CATH AND CORONARY ANGIOGRAPHY N/A 05/22/2018   Procedure: LEFT HEART CATH AND CORONARY ANGIOGRAPHY;  Surgeon: Jettie Booze, MD;  Location: [facility];  Service: Cardiovascular;  Laterality: N/A;  . NASAL SINUS SURGERY    . PROSTATE BIOPSY    . RADIAL ARTERY HARVEST Left 05/24/2018   Procedure: RADIAL ARTERY HARVEST;  Surgeon: Gaye Pollack, MD;  Location: [facility];  Service:  Open Heart Surgery;  Laterality: Left;  . right shoulder rotator cuff repair    . TEE WITHOUT CARDIOVERSION N/A 05/24/2018   Procedure: TRANSESOPHAGEAL ECHOCARDIOGRAM (TEE);  Surgeon: Gaye Pollack, MD;  Location: [facility];  Service: Open Heart Surgery;  Laterality: N/A;    Current Medications: Current Meds  Medication Sig  . aspirin EC 81 MG tablet Take 1 tablet (81 mg total) by mouth daily. Resume on 06/09/19  . azelastine (ASTELIN) 0.1 % nasal spray Place 1 spray into both nostrils 2 (two) times daily. (Patient taking differently: Place 1 spray into both nostrils 2 (two) times daily as needed for rhinitis or allergies. )  . baclofen (LIORESAL) 10 MG tablet Take 0.5-1 tablets (5-10 mg total) by mouth 3 (three) times daily as needed for muscle spasms.  . Cyanocobalamin (VITAMIN B-12) 5000 MCG TBDP Take 5,000 mcg by mouth daily.   [name] dextromethorphan-guaiFENesin (MUCINEX DM) 30-600 MG 12hr tablet Take 1 tablet by mouth in the morning and at bedtime.  [name] EPINEPHRINE 0.3 mg/0.3 mL IJ SOAJ injection INJECT 0.3 ML(0.3MG ) INTO THE MUSCLE AS NEEDED FOR ALLERGIC REACTION (Patient taking differently: Inject 0.3 mg into the muscle once as needed for anaphylaxis (or as directed for severe allergic reaction). )  . ezetimibe (ZETIA) 10 MG tablet Take 1 tablet (10 mg total) by mouth daily. (Patient taking differently: Take 10 mg by mouth at bedtime. )  . Fe Fum-FA-B Cmp-C-Zn-Mg-Mn-Cu (HEMOCYTE PLUS) 106-1 MG CAPS Take 1 capsule by mouth daily.  . fluticasone (FLONASE) 50 MCG/ACT nasal spray SHAKE LIQUID AND USE 1 SPRAY IN EACH NOSTRIL DAILY  . icatibant (FIRAZYR) 30 MG/3ML injection Inject 30 mg into the skin once as needed (for sudden attacks of hereditary angioedema).   [name] levothyroxine (SYNTHROID) 100 MCG tablet Take 100 mcg by mouth daily before breakfast.  . metoprolol tartrate (LOPRESSOR) 25 MG tablet Take 0.5 tablets (12.5 mg total) by mouth 2 (two) times daily.  . montelukast (SINGULAIR) 10 MG tablet Take 1  tablet (10 mg total) by mouth at bedtime.  . nabumetone (RELAFEN) 750 MG tablet Take 1,500 mg by mouth daily.  . nitroGLYCERIN (NITROSTAT) 0.4 MG SL tablet DISSOLVE 1 TABLET UNDER TONGUE EVERY 5 MINUTES FOR 3 DOSES AS NEEDED CHEST PAIN. IF NO RELIEF CALL 911 (Patient taking differently: Place 0.4 mg under the tongue every 5 (five) minutes as needed for chest pain. )  . NONFORMULARY OR COMPOUNDED ITEM Use as directed 5-15 mLs in the mouth or throat See admin instructions. Compounded mouth wash/gargle:1/1/1/1/1 (80 ml's of each) Purified water; Lidocaine Viscous; Nystatin suspension; Prednisolone 15/5; Maalox: Swish and spit using 5-15 ml's every 6 hours as needed for irritation  . OVER THE COUNTER MEDICATION Take 2 capsules by mouth See admin instructions. Health Plus/Super Colon Cleanse: Take 2 capsules by mouth in the morning  .  pantoprazole (PROTONIX) 40 MG tablet TAKE 1 TABLET(40 MG) BY MOUTH TWICE DAILY  . Pitavastatin Calcium (LIVALO) 4 MG TABS Take 4 mg by mouth at bedtime.   [name] TAKHZYRO 300 MG/2ML SOLN Inject 300 mg into the muscle every 14 (fourteen) days.      Allergies:   Lisinopril, Tape, Benadryl [diphenhydramine hcl], Darvocet [propoxyphene n-acetaminophen], Darvon [propoxyphene hcl], and Tylenol [acetaminophen]   Social History   Socioeconomic History  . Marital status: Married    Spouse name: Not on file  . Number of children: Not on file  . Years of education: Not on file  . Highest education level: Not on file  Occupational History  . Occupation: retired  Tobacco Use  . Smoking status: Never Smoker  . Smokeless tobacco: Never Used  Vaping Use  . Vaping Use: Never used  Substance and Sexual Activity  . Alcohol use: Never  . Drug use: No  . Sexual activity: Not on file  Other Topics Concern  . Not on file  Social History Narrative  . Not on file   Social Determinants of Health   Financial Resource Strain:   . Difficulty of Paying Living Expenses:   Food  Insecurity: No Food Insecurity  . Worried About Charity fundraiser in the Last Year: Never true  . Ran Out of Food in the Last Year: Never true  Transportation Needs: No Transportation Needs  . Lack of Transportation (Medical): No  . Lack of Transportation (Non-Medical): No  Physical Activity:   . Days of Exercise per Week:   . Minutes of Exercise per Session:   Stress:   . Feeling of Stress :   Social Connections:   . Frequency of Communication with Friends and Family:   . Frequency of Social Gatherings with Friends and Family:   . Attends Religious Services:   . Active Member of Clubs or Organizations:   . Attends Archivist Meetings:   [name] Marital Status:      Family History: The patient's family history includes Cancer in his father, mother, and sister; Stomach cancer in his mother.  ROS:   Please see the history of present illness.    All other systems reviewed and are negative.  EKGs/Labs/Other Studies Reviewed:    The following studies were reviewed today: I discussed my findings with the patient at length and reviewed records extensively.   Recent Labs: 06/06/2019: ALT 20; BUN 14; Creatinine, Ser 1.48; Hemoglobin 10.5; Platelets 181; Potassium 3.5; Sodium 141  Recent Lipid Panel    Component Value Date/Time   CHOL 183 05/23/2019 0826   TRIG 179 (H) 05/23/2019 0826   HDL 44 05/23/2019 0826   CHOLHDL 4.2 05/23/2019 0826   CHOLHDL 3.7 05/24/2015 0725   VLDL 38 05/24/2015 0725   LDLCALC 108 (H) 05/23/2019 0826    Physical Exam:    VS:  BP 140/80   Pulse 68   Ht 6\' 1"  (1.854 m)   Wt 199 lb (90.3 kg)   SpO2 97%   BMI 26.25 kg/m     Wt Readings from Last 3 Encounters:  09/30/19 199 lb (90.3 kg)  09/29/19 201 lb 3.2 oz (91.3 kg)  06/23/19 205 lb (93 kg)     GEN: Patient is in no acute distress HEENT: Normal NECK: No JVD; No carotid bruits LYMPHATICS: No lymphadenopathy CARDIAC: Hear sounds regular, 2/6 systolic murmur at the  apex. RESPIRATORY:  Clear to auscultation without rales, wheezing or rhonchi  ABDOMEN: Soft, non-tender, non-distended  MUSCULOSKELETAL:  No edema; No deformity  SKIN: Warm and dry NEUROLOGIC:  Alert and oriented x 3 PSYCHIATRIC:  Normal affect   Signed, Jenean Lindau, MD  09/30/2019 9:59 AM    [facility]

## 2019-09-30 NOTE — Patient Instructions (Signed)

## 2019-10-07 ENCOUNTER — Encounter: Payer: Self-pay | Admitting: Allergy and Immunology

## 2019-10-07 ENCOUNTER — Ambulatory Visit (INDEPENDENT_AMBULATORY_CARE_PROVIDER_SITE_OTHER): Payer: Medicare Other | Admitting: Allergy and Immunology

## 2019-10-07 ENCOUNTER — Other Ambulatory Visit: Payer: Self-pay

## 2019-10-07 VITALS — BP 138/70 | HR 66 | Temp 98.0°F | Resp 16 | Wt 199.4 lb

## 2019-10-07 DIAGNOSIS — K219 Gastro-esophageal reflux disease without esophagitis: Secondary | ICD-10-CM | POA: Diagnosis not present

## 2019-10-07 DIAGNOSIS — D841 Defects in the complement system: Secondary | ICD-10-CM

## 2019-10-07 DIAGNOSIS — J3089 Other allergic rhinitis: Secondary | ICD-10-CM

## 2019-10-07 DIAGNOSIS — I2510 Atherosclerotic heart disease of native coronary artery without angina pectoris: Secondary | ICD-10-CM

## 2019-10-07 NOTE — Patient Instructions (Signed)
  1.  Continue Takhzryo every 14 days.   2.  Continue to Treat and prevent inflammation:    A.  Flonase - 1 spray each nostril twice a day  B.  Azelastine - 1 spray each nostril twice a day  3.  Continue to Treat and prevent reflux:   A.  pantoprazole 40 mg twice a day  4.  If needed:   A.  Nasal saline spray  B.  OTC antihistamine - loratadine 10 mg 1 time per day  C.  OTC Mucinex DM - 2 tablets twice a day  D.  Firazyr injection for swelling reaction  5. Return to clinic in 6 months or earlier if problem  6.  Obtain flu vaccine this fall

## 2019-10-07 NOTE — Progress Notes (Signed)
[place] [facility] [place] [facility] [place]   Follow-up Note  Referring Provider: [name], NP Primary Provider: [name], NP Date of Office Visit: 10/07/2019  Subjective:   Daniel Herrera (DOB: 06-13-1940) is a 79 y.o. male who returns to the [facility] on 10/07/2019 in re-evaluation of the following:  HPI: [name] returns to this clinic in evaluation of type II hereditary angioedema treated with Asencion Islam and history of allergic rhinitis and LPR and interstitial lung disease.  His last visit to this clinic was 08 April 2019.  He has not had any swelling episodes while using Tachzyro.  He has not had to need Firazyr.  He has not had any side effects from his treatment.  His nose has really been doing quite well using a nasal steroid and a nasal antihistamine.  He has not required an antibiotic to treat an episode of sinusitis.  His throat issue tied up with LPR is doing very well at this point in time while consistently using a proton pump inhibitor twice a day.  He informs me that he visited with Dr. Elsworth Soho recently and was found to have stable pulmonary fibrosis compared from 2020-20 21.  He has obtain 2 Pfizer Covid vaccinations.  He apparently has had a problem with a cholesterol medicine recently with myalgia and what sounds like neuropathy and he discontinued this agent 2 months ago and most of the side effects have resolved.  Allergies as of 10/07/2019      Reactions   Lisinopril Anaphylaxis, Swelling, Other (See Comments)   Face, lips, and throat swell   Tape Other (See Comments)   PLASTIC TAPE TEARS OFF THE SKIN- Please use an alternative!!   Benadryl [diphenhydramine Hcl] Swelling, Other (See Comments)   Facial swelling from high dose (tolerates Advil PM as needed)   Darvocet [propoxyphene N-acetaminophen] Other (See Comments)   Hallucinations   Darvon [propoxyphene Hcl] Other (See Comments)   Hallucination   Tylenol  [acetaminophen] Swelling, Other (See Comments)   Foot swelling      Medication List      aspirin EC 81 MG tablet Take 1 tablet (81 mg total) by mouth daily. Resume on 06/09/19   azelastine 0.1 % nasal spray Commonly known as: ASTELIN Place 1 spray into both nostrils 2 (two) times daily.   baclofen 10 MG tablet Commonly known as: LIORESAL Take 0.5-1 tablets (5-10 mg total) by mouth 3 (three) times daily as needed for muscle spasms.   dextromethorphan-guaiFENesin 30-600 MG 12hr tablet Commonly known as: MUCINEX DM Take 1 tablet by mouth in the morning and at bedtime.   EPINEPHrine 0.3 mg/0.3 mL Soaj injection Commonly known as: EPI-PEN INJECT 0.3 ML(0.3MG ) INTO THE MUSCLE AS NEEDED FOR ALLERGIC REACTION   ezetimibe 10 MG tablet Commonly known as: ZETIA Take 1 tablet (10 mg total) by mouth daily.   fluticasone 50 MCG/ACT nasal spray Commonly known as: FLONASE SHAKE LIQUID AND USE 1 SPRAY IN EACH NOSTRIL DAILY   Hemocyte Plus 106-1 MG Caps Take 1 capsule by mouth daily.   icatibant 30 MG/3ML injection Commonly known as: FIRAZYR Inject 30 mg into the skin once as needed (for sudden attacks of hereditary angioedema).   levothyroxine 100 MCG tablet Commonly known as: SYNTHROID Take 100 mcg by mouth daily before breakfast.   Livalo 4 MG Tabs Generic drug: Pitavastatin Calcium Take 4 mg by mouth at bedtime.   metoprolol tartrate 25 MG tablet Commonly known  as: LOPRESSOR Take 0.5 tablets (12.5 mg total) by mouth 2 (two) times daily.   montelukast 10 MG tablet Commonly known as: SINGULAIR Take 1 tablet (10 mg total) by mouth at bedtime.   nabumetone 750 MG tablet Commonly known as: RELAFEN Take 1,500 mg by mouth daily.   nitroGLYCERIN 0.4 MG SL tablet Commonly known as: NITROSTAT DISSOLVE 1 TABLET UNDER TONGUE EVERY 5 MINUTES FOR 3 DOSES AS NEEDED CHEST PAIN. IF NO RELIEF CALL 911   NONFORMULARY OR COMPOUNDED ITEM Use as directed 5-15 mLs in the mouth or throat  See admin instructions. Compounded mouth wash/gargle:1/1/1/1/1 (80 ml's of each) Purified water; Lidocaine Viscous; Nystatin suspension; Prednisolone 15/5; Maalox: Swish and spit using 5-15 ml's every 6 hours as needed for irritation   OVER THE COUNTER MEDICATION Take 2 capsules by mouth See admin instructions. Health Plus/Super Colon Cleanse: Take 2 capsules by mouth in the morning   pantoprazole 40 MG tablet Commonly known as: PROTONIX TAKE 1 TABLET(40 MG) BY MOUTH TWICE DAILY   Takhzyro 300 MG/2ML Soln Generic drug: Lanadelumab-flyo Inject 300 mg into the muscle every 14 (fourteen) days.   Vitamin B-12 5000 MCG Tbdp Take 5,000 mcg by mouth daily.       Past Medical History:  Diagnosis Date   Angio-edema    Anxiety    Arthritis    CAD (coronary artery disease)    a. 02/2015: DES to mid-LAD   Cataract    Geographic tongue    GERD (gastroesophageal reflux disease)    Gout    High cholesterol    History of prolonged Q-T interval on ECG    Hx of Clostridium difficile infection    Hx of umbilical hernia repair    Hypertension    Myocardial infarction [facility])    Personal history of digestive disease    gastric ulcer   Prostate cancer ([place])    Prostate enlargement    Skin cancer    basal cell carcinoma   Thyroid disease    Urticaria     Past Surgical History:  Procedure Laterality Date   APPENDECTOMY     BACK SURGERY     CARDIAC CATHETERIZATION N/A 02/21/2015   Procedure: Left Heart Cath;  Surgeon: Lorretta Harp, MD;  Location: [facility];  Service: Cardiovascular;  Laterality: N/A;   CATARACT EXTRACTION, BILATERAL     CORONARY ARTERY BYPASS GRAFT N/A 05/24/2018   Procedure: CORONARY ARTERY BYPASS GRAFTING (CABG), ON PUMP, TIMES THREE, USING BILATERAL INTERNAL MAMMARY ARTERY AND HARVESTED LEFT RADIAL ARTERY;  Surgeon: Gaye Pollack, MD;  Location: [place] [facility];  Service: Open Heart Surgery;  Laterality: N/A;   LEFT HEART CATH AND CORONARY  ANGIOGRAPHY N/A 05/22/2018   Procedure: LEFT HEART CATH AND CORONARY ANGIOGRAPHY;  Surgeon: Jettie Booze, MD;  Location: [facility];  Service: Cardiovascular;  Laterality: N/A;   NASAL SINUS SURGERY     PROSTATE BIOPSY     RADIAL ARTERY HARVEST Left 05/24/2018   Procedure: RADIAL ARTERY HARVEST;  Surgeon: Gaye Pollack, MD;  Location: [facility];  Service: Open Heart Surgery;  Laterality: Left;   right shoulder rotator cuff repair     TEE WITHOUT CARDIOVERSION N/A 05/24/2018   Procedure: TRANSESOPHAGEAL ECHOCARDIOGRAM (TEE);  Surgeon: Gaye Pollack, MD;  Location: [facility];  Service: Open Heart Surgery;  Laterality: N/A;    Review of systems negative except as noted in HPI / PMHx or noted below:  Review of Systems  Constitutional: Negative.  HENT: Negative.   Eyes: Negative.   Respiratory: Negative.   Cardiovascular: Negative.   Gastrointestinal: Negative.   Genitourinary: Negative.   Musculoskeletal: Negative.   Skin: Negative.   Neurological: Negative.   Endo/Heme/Allergies: Negative.   Psychiatric/Behavioral: Negative.      Objective:   Vitals:   10/07/19 0944  BP: 138/70  Pulse: 66  Resp: 16  Temp: 98 F (36.7 C)  SpO2: 98%      Weight: 199 lb 6.4 oz (90.4 kg)   Physical Exam Constitutional:      Appearance: He is not diaphoretic.  HENT:     Head: Normocephalic.     Right Ear: Tympanic membrane, ear canal and external ear normal.     Left Ear: Tympanic membrane, ear canal and external ear normal.     Nose: Nose normal. No mucosal edema or rhinorrhea.     Mouth/Throat:     Pharynx: Uvula midline. No oropharyngeal exudate.  Eyes:     Conjunctiva/sclera: Conjunctivae normal.  Neck:     Thyroid: No thyromegaly.     Trachea: Trachea normal. No tracheal tenderness or tracheal deviation.  Cardiovascular:     Rate and Rhythm: Normal rate and regular rhythm.     Heart sounds: Normal heart sounds, S1 normal and S2 normal. No murmur heard.    Pulmonary:     Effort: No respiratory distress.     Breath sounds: Normal breath sounds. No stridor. No wheezing or rales.  Lymphadenopathy:     Head:     Right side of head: No tonsillar adenopathy.     Left side of head: No tonsillar adenopathy.     Cervical: No cervical adenopathy.  Skin:    Findings: No erythema or rash.     Nails: There is no clubbing.  Neurological:     Mental Status: He is alert.     Diagnostics: none  Assessment and Plan:   1. Hereditary angioedema type 2 ([place])   2. Perennial allergic rhinitis   3. LPRD (laryngopharyngeal reflux disease)     1.  Continue Takhzryo every 14 days.   2.  Continue to Treat and prevent inflammation:    A.  Flonase - 1 spray each nostril twice a day  B.  Azelastine - 1 spray each nostril twice a day  3.  Continue to Treat and prevent reflux:   A.  pantoprazole 40 mg twice a day  4.  If needed:   A.  Nasal saline spray  B.  OTC antihistamine - loratadine 10 mg 1 time per day  C.  OTC Mucinex DM - 2 tablets twice a day  D.  Firazyr injection for swelling reaction  5. Return to clinic in 6 months or earlier if problem  6.  Obtain flu vaccine this fall  [name] is really doing very well on Tachzyroand he will continue on agent and all of his other issues tied up with his airway including his rhinitis and LPR appear to be under good control on current therapy and we will see him back in this clinic in 6 months or earlier if there is a problem  Allena Katz, MD Allergy / [place]

## 2019-10-08 ENCOUNTER — Encounter: Payer: Self-pay | Admitting: Allergy and Immunology

## 2019-10-22 ENCOUNTER — Other Ambulatory Visit: Payer: Self-pay | Admitting: *Deleted

## 2019-10-22 ENCOUNTER — Telehealth: Payer: Self-pay

## 2019-10-22 MED ORDER — ICATIBANT ACETATE 30 MG/3ML ~~LOC~~ SOLN: 30.0000 mg | Freq: Once | SUBCUTANEOUS | 5 refills | Status: DC | PRN

## 2019-10-22 NOTE — Telephone Encounter (Signed)
Patient called and wanted to let you know that he used one of the injections Netta Corrigan) for swelling. He states he is doing good at the moment and that the swelling went down. He just wanted to make sure you were aware of this.

## 2019-10-22 NOTE — Telephone Encounter (Signed)
Please inform patient that we thank him for informing us about this issue and we will document that he had an exacerbation of his hereditary angioedema this month.

## 2019-10-23 NOTE — Telephone Encounter (Signed)
Patient notified

## 2019-10-28 ENCOUNTER — Telehealth: Payer: Self-pay | Admitting: Allergy and Immunology

## 2019-10-28 NOTE — Telephone Encounter (Signed)
[name], is this one of your medications of HAE?

## 2019-10-28 NOTE — Telephone Encounter (Signed)
PA for icatibant

## 2019-10-29 NOTE — Telephone Encounter (Signed)
PA started for generic Netta Corrigan

## 2019-10-30 NOTE — Telephone Encounter (Signed)
Per [employer] now [place] preferred on formulary.  Will try to get Icatibant approved since patient is unable to self administer

## 2019-10-30 NOTE — Telephone Encounter (Signed)
Got patients Icatibant approved and sent in assistance paperwork

## 2019-11-05 ENCOUNTER — Other Ambulatory Visit: Payer: Self-pay | Admitting: Allergy and Immunology

## 2019-11-10 ENCOUNTER — Other Ambulatory Visit: Payer: Self-pay | Admitting: Allergy and Immunology

## 2019-11-24 ENCOUNTER — Other Ambulatory Visit: Payer: Self-pay | Admitting: *Deleted

## 2019-11-24 MED ORDER — TAKHZYRO 300 MG/2ML ~~LOC~~ SOLN: 300.0000 mg | SUBCUTANEOUS | 11 refills | Status: DC

## 2019-12-09 DIAGNOSIS — I10 Essential (primary) hypertension: Secondary | ICD-10-CM | POA: Diagnosis not present

## 2019-12-09 DIAGNOSIS — D841 Defects in the complement system: Secondary | ICD-10-CM | POA: Diagnosis not present

## 2019-12-09 DIAGNOSIS — E538 Deficiency of other specified B group vitamins: Secondary | ICD-10-CM | POA: Diagnosis not present

## 2019-12-09 DIAGNOSIS — C61 Malignant neoplasm of prostate: Secondary | ICD-10-CM | POA: Diagnosis not present

## 2019-12-09 DIAGNOSIS — I2510 Atherosclerotic heart disease of native coronary artery without angina pectoris: Secondary | ICD-10-CM | POA: Diagnosis not present

## 2019-12-09 DIAGNOSIS — E1169 Type 2 diabetes mellitus with other specified complication: Secondary | ICD-10-CM | POA: Diagnosis not present

## 2019-12-09 DIAGNOSIS — E559 Vitamin D deficiency, unspecified: Secondary | ICD-10-CM | POA: Diagnosis not present

## 2019-12-09 DIAGNOSIS — E039 Hypothyroidism, unspecified: Secondary | ICD-10-CM | POA: Diagnosis not present

## 2019-12-09 DIAGNOSIS — E785 Hyperlipidemia, unspecified: Secondary | ICD-10-CM | POA: Diagnosis not present

## 2019-12-09 DIAGNOSIS — N1831 Chronic kidney disease, stage 3a: Secondary | ICD-10-CM | POA: Diagnosis not present

## 2019-12-09 DIAGNOSIS — D649 Anemia, unspecified: Secondary | ICD-10-CM | POA: Diagnosis not present

## 2019-12-09 DIAGNOSIS — Z6827 Body mass index (BMI) 27.0-27.9, adult: Secondary | ICD-10-CM | POA: Diagnosis not present

## 2019-12-10 ENCOUNTER — Encounter: Payer: Self-pay | Admitting: Allergy and Immunology

## 2019-12-15 ENCOUNTER — Other Ambulatory Visit: Payer: Self-pay

## 2019-12-15 ENCOUNTER — Ambulatory Visit (INDEPENDENT_AMBULATORY_CARE_PROVIDER_SITE_OTHER): Payer: Medicare Other | Admitting: Allergy and Immunology

## 2019-12-15 ENCOUNTER — Encounter: Payer: Self-pay | Admitting: Allergy and Immunology

## 2019-12-15 VITALS — BP 128/78 | HR 72 | Resp 16

## 2019-12-15 DIAGNOSIS — D841 Defects in the complement system: Secondary | ICD-10-CM

## 2019-12-15 DIAGNOSIS — J3089 Other allergic rhinitis: Secondary | ICD-10-CM

## 2019-12-15 DIAGNOSIS — J849 Interstitial pulmonary disease, unspecified: Secondary | ICD-10-CM

## 2019-12-15 DIAGNOSIS — L570 Actinic keratosis: Secondary | ICD-10-CM

## 2019-12-15 DIAGNOSIS — K219 Gastro-esophageal reflux disease without esophagitis: Secondary | ICD-10-CM | POA: Diagnosis not present

## 2019-12-15 DIAGNOSIS — I2510 Atherosclerotic heart disease of native coronary artery without angina pectoris: Secondary | ICD-10-CM | POA: Diagnosis not present

## 2019-12-15 MED ORDER — EPINEPHRINE 0.3 MG/0.3ML IJ SOAJ: INTRAMUSCULAR | 3 refills | Status: DC

## 2019-12-15 MED ORDER — FLUTICASONE PROPIONATE 50 MCG/ACT NA SUSP
NASAL | 1 refills | Status: DC
Start: 2019-12-15 — End: 2020-07-30

## 2019-12-15 MED ORDER — FLUOROURACIL 0.5 % EX CREA: TOPICAL_CREAM | CUTANEOUS | 0 refills | Status: DC

## 2019-12-15 NOTE — Patient Instructions (Addendum)
  1.  Continue Takhzryo every 14 days.   2.  Continue to Treat and prevent inflammation:    A.  Flonase - 1 spray each nostril twice a day  B.  Azelastine - 1 spray each nostril twice a day  3.  Continue to Treat and prevent reflux:   A.  pantoprazole 40 mg twice a day  4.  If needed:   A.  Nasal saline spray  B.  OTC antihistamine - loratadine 10 mg 1 time per day  C.  OTC Mucinex DM - 2 tablets twice a day  D.  Firazyr injection for swelling reaction  E.  Carac topical - apply to skin cancer for 1-7 days during 'flare up'  5. Return to clinic in 6 months or earlier if problem  6.  Obtain fall flu vaccine and Covid booster

## 2019-12-15 NOTE — Progress Notes (Signed)
[facility] [place]   Follow-up Note  Referring Provider: [name], NP Primary Provider: [name], NP Date of Office Visit: 12/15/2019  Subjective:   Daniel Herrera (DOB: [birth year]) is a 79 y.o. male who returns to the [facility] on 12/15/2019 in re-evaluation of the following:  HPI: Daniel Herrera returns to this clinic in evaluation of type II hereditary angioedema treated with [employer], history of allergic rhinitis, and history of LPR and interstitial lung disease.  His last visit to this clinic was 07 October 2019.  Fortunately, Daniel Herrera has not had any swelling episodes.  He continues to use his Asencion Islam every 14 days without incident.  He does have Firazyr which he has not used.  His upper airway issue is under very good control while utilizing a combination of the nasal antihistamine and steroid.  He has not required a systemic steroid or antibiotic for any type of airway issue.  His interstitial lung disease appears to be stable and he has had very little lower airway symptomatology.  He feels the best that he has in a prolonged period in time.  His reflux is under very good control at this point in time on his current therapy.  He does inform me that he has had several skin cancers on his face and has been seeing a dermatologist who has been freezing off the skin cancers whenever they present.  He finds this form of therapy too expensive and he would like to go back to using 5-fluorouracil topically which has worked really well in the past.  Allergies as of 12/15/2019      Reactions   Lisinopril Anaphylaxis, Swelling, Other (See Comments)   Face, lips, and throat swell   Tape Other (See Comments)   PLASTIC TAPE TEARS OFF THE SKIN- Please use an alternative!!   Benadryl [diphenhydramine Hcl] Swelling, Other (See Comments)   Facial swelling from high dose (tolerates Advil PM as needed)   Darvocet [propoxyphene N-acetaminophen]  Other (See Comments)   Hallucinations   Darvon [propoxyphene Hcl] Other (See Comments)   Hallucination   Tylenol [acetaminophen] Swelling, Other (See Comments)   Foot swelling      Medication List      aspirin EC 81 MG tablet Take 1 tablet (81 mg total) by mouth daily. Resume on 06/09/19   azelastine 0.1 % nasal spray Commonly known as: ASTELIN USE 1 SPRAY IN EACH NOSTRIL TWICE DAILY   baclofen 10 MG tablet Commonly known as: LIORESAL Take 0.5-1 tablets (5-10 mg total) by mouth 3 (three) times daily as needed for muscle spasms.   dextromethorphan-guaiFENesin 30-600 MG 12hr tablet Commonly known as: MUCINEX DM Take 1 tablet by mouth in the morning and at bedtime.   EPINEPHrine 0.3 mg/0.3 mL Soaj injection Commonly known as: EPI-PEN INJECT 0.3 ML(0.3MG ) INTO THE MUSCLE AS NEEDED FOR ALLERGIC REACTION What changed: See the new instructions.   ezetimibe 10 MG tablet Commonly known as: ZETIA Take 1 tablet (10 mg total) by mouth daily.   fluticasone 50 MCG/ACT nasal spray Commonly known as: FLONASE SHAKE LIQUID AND USE 1 SPRAY IN EACH NOSTRIL DAILY   Hemocyte Plus 106-1 MG Caps Take 1 capsule by mouth daily.   icatibant 30 MG/3ML injection Commonly known as: FIRAZYR Inject 3 mLs (30 mg total) into the skin once as needed (for sudden attacks of hereditary angioedema).   levothyroxine 100 MCG tablet Commonly known as: SYNTHROID Take 100 mcg by  mouth daily before breakfast.   Livalo 4 MG Tabs Generic drug: Pitavastatin Calcium Take 4 mg by mouth at bedtime.   metoprolol tartrate 25 MG tablet Commonly known as: LOPRESSOR Take 0.5 tablets (12.5 mg total) by mouth 2 (two) times daily.   montelukast 10 MG tablet Commonly known as: SINGULAIR Take 1 tablet (10 mg total) by mouth at bedtime.   nabumetone 750 MG tablet Commonly known as: RELAFEN Take 1,500 mg by mouth daily.   nitroGLYCERIN 0.4 MG SL tablet Commonly known as: NITROSTAT DISSOLVE 1 TABLET UNDER TONGUE  EVERY 5 MINUTES FOR 3 DOSES AS NEEDED CHEST PAIN. IF NO RELIEF CALL 911   OVER THE COUNTER MEDICATION Take 2 capsules by mouth See admin instructions. Health Plus/Super Colon Cleanse: Take 2 capsules by mouth in the morning   pantoprazole 40 MG tablet Commonly known as: PROTONIX TAKE 1 TABLET(40 MG) BY MOUTH TWICE DAILY   Takhzyro 300 MG/2ML Soln Generic drug: Lanadelumab-flyo Inject 300 mg into the muscle every 14 (fourteen) days.   Vitamin B-12 5000 MCG Tbdp Take 5,000 mcg by mouth daily.       Past Medical History:  Diagnosis Date  . Angio-edema   . Anxiety   . Arthritis   . CAD (coronary artery disease)    a. 02/2015: DES to mid-LAD  . Cataract   . Geographic tongue   . GERD (gastroesophageal reflux disease)   . Gout   . High cholesterol   . History of prolonged Q-T interval on ECG   . Hx of Clostridium difficile infection   . Hx of umbilical hernia repair   . Hypertension   . Myocardial infarction ([place])   . Personal history of digestive disease    gastric ulcer  . Prostate cancer ([place])   . Prostate enlargement   . Skin cancer    basal cell carcinoma  . Thyroid disease   . Urticaria     Past Surgical History:  Procedure Laterality Date  . APPENDECTOMY    . BACK SURGERY    . CARDIAC CATHETERIZATION N/A 02/21/2015   Procedure: Left Heart Cath;  Surgeon: Lorretta Harp, MD;  Location: [facility];  Service: Cardiovascular;  Laterality: N/A;  . CATARACT EXTRACTION, BILATERAL    . CORONARY ARTERY BYPASS GRAFT N/A 05/24/2018   Procedure: CORONARY ARTERY BYPASS GRAFTING (CABG), ON PUMP, TIMES THREE, USING BILATERAL INTERNAL MAMMARY ARTERY AND HARVESTED LEFT RADIAL ARTERY;  Surgeon: Gaye Pollack, MD;  Location: [facility];  Service: Open Heart Surgery;  Laterality: N/A;  . LEFT HEART CATH AND CORONARY ANGIOGRAPHY N/A 05/22/2018   Procedure: LEFT HEART CATH AND CORONARY ANGIOGRAPHY;  Surgeon: Jettie Booze, MD;  Location: [facility];  Service:  Cardiovascular;  Laterality: N/A;  . NASAL SINUS SURGERY    . PROSTATE BIOPSY    . RADIAL ARTERY HARVEST Left 05/24/2018   Procedure: RADIAL ARTERY HARVEST;  Surgeon: Gaye Pollack, MD;  Location: [facility];  Service: Open Heart Surgery;  Laterality: Left;  . right shoulder rotator cuff repair    . TEE WITHOUT CARDIOVERSION N/A 05/24/2018   Procedure: TRANSESOPHAGEAL ECHOCARDIOGRAM (TEE);  Surgeon: Gaye Pollack, MD;  Location: [facility];  Service: Open Heart Surgery;  Laterality: N/A;    Review of systems negative except as noted in HPI / PMHx or noted below:  Review of Systems  Constitutional: Negative.   HENT: Negative.   Eyes: Negative.   Respiratory: Negative.   Cardiovascular: Negative.   Gastrointestinal:  Negative.   Genitourinary: Negative.   Musculoskeletal: Negative.   Skin: Negative.   Neurological: Negative.   Endo/Heme/Allergies: Negative.   Psychiatric/Behavioral: Negative.      Objective:   Vitals:   12/15/19 1549  BP: 128/78  Pulse: 72  Resp: 16  SpO2: 97%          Physical Exam Constitutional:      Appearance: He is not diaphoretic.  HENT:     Head: Normocephalic.     Right Ear: Tympanic membrane, ear canal and external ear normal.     Left Ear: Tympanic membrane, ear canal and external ear normal.     Nose: Nose normal. No mucosal edema or rhinorrhea.     Mouth/Throat:     Pharynx: Uvula midline. No oropharyngeal exudate.  Eyes:     Conjunctiva/sclera: Conjunctivae normal.  Neck:     Thyroid: No thyromegaly.     Trachea: Trachea normal. No tracheal tenderness or tracheal deviation.  Cardiovascular:     Rate and Rhythm: Normal rate and regular rhythm.     Heart sounds: Normal heart sounds, S1 normal and S2 normal. No murmur heard.   Pulmonary:     Effort: No respiratory distress.     Breath sounds: Normal breath sounds. No stridor. No wheezing or rales.  Lymphadenopathy:     Head:     Right side of head: No tonsillar adenopathy.     Left  side of head: No tonsillar adenopathy.     Cervical: No cervical adenopathy.  Skin:    Findings: No erythema or rash.     Nails: There is no clubbing.  Neurological:     Mental Status: He is alert.     Diagnostics: none  Assessment and Plan:   1. Hereditary angioedema type 2 ([place])   2. Perennial allergic rhinitis   3. LPRD (laryngopharyngeal reflux disease)   4. ILD (interstitial lung disease) ([place])   5. Actinic keratoses     1.  Continue Takhzryo every 14 days.   2.  Continue to Treat and prevent inflammation:    A.  Flonase - 1 spray each nostril twice a day  B.  Azelastine - 1 spray each nostril twice a day  3.  Continue to Treat and prevent reflux:   A.  pantoprazole 40 mg twice a day  4.  If needed:   A.  Nasal saline spray  B.  OTC antihistamine - loratadine 10 mg 1 time per day  C.  OTC Mucinex DM - 2 tablets twice a day  D.  Firazyr injection for swelling reaction  E.  Carac topical - apply to skin cancer for 1-7 days during 'flare up'  5. Return to clinic in 6 months or earlier if problem  6.  Obtain fall flu vaccine and Covid booster  Overall Daniel Herrera is really doing very well on his current plan which includes using Takhzyro every 14 days and addressing his upper airway inflammatory disease and his reflux induced respiratory disease.  I have given him topical 5-fluorouracil to utilize on his recurrent actinic keratoses involving his face as he requested.  I will see him back in his clinic in 6 months or earlier if there is a problem.  Allena Katz, MD Allergy / Immunology [place]

## 2019-12-16 ENCOUNTER — Encounter: Payer: Self-pay | Admitting: Allergy and Immunology

## 2019-12-20 DIAGNOSIS — Z23 Encounter for immunization: Secondary | ICD-10-CM | POA: Diagnosis not present

## 2019-12-23 ENCOUNTER — Encounter: Payer: Self-pay | Admitting: Allergy and Immunology

## 2020-01-12 DIAGNOSIS — Z20828 Contact with and (suspected) exposure to other viral communicable diseases: Secondary | ICD-10-CM | POA: Diagnosis not present

## 2020-01-12 DIAGNOSIS — Z23 Encounter for immunization: Secondary | ICD-10-CM | POA: Diagnosis not present

## 2020-01-28 ENCOUNTER — Encounter: Payer: Self-pay | Admitting: Orthopaedic Surgery

## 2020-01-28 ENCOUNTER — Ambulatory Visit (INDEPENDENT_AMBULATORY_CARE_PROVIDER_SITE_OTHER): Payer: Medicare Other | Admitting: Orthopaedic Surgery

## 2020-01-28 ENCOUNTER — Ambulatory Visit (INDEPENDENT_AMBULATORY_CARE_PROVIDER_SITE_OTHER): Payer: Medicare Other

## 2020-01-28 ENCOUNTER — Other Ambulatory Visit: Payer: Self-pay

## 2020-01-28 DIAGNOSIS — I2510 Atherosclerotic heart disease of native coronary artery without angina pectoris: Secondary | ICD-10-CM

## 2020-01-28 DIAGNOSIS — G8929 Other chronic pain: Secondary | ICD-10-CM | POA: Diagnosis not present

## 2020-01-28 DIAGNOSIS — M25562 Pain in left knee: Secondary | ICD-10-CM | POA: Diagnosis not present

## 2020-01-28 MED ORDER — BUPIVACAINE HCL 0.5 % IJ SOLN
2.0000 mL | INTRAMUSCULAR | Status: AC | PRN
  Administered 2020-01-28: 2 mL via INTRA_ARTICULAR

## 2020-01-28 MED ORDER — LIDOCAINE HCL 1 % IJ SOLN
2.0000 mL | INTRAMUSCULAR | Status: AC | PRN
  Administered 2020-01-28: 2 mL

## 2020-01-28 MED ORDER — METHYLPREDNISOLONE ACETATE 40 MG/ML IJ SUSP
40.0000 mg | INTRAMUSCULAR | Status: AC | PRN
  Administered 2020-01-28: 40 mg via INTRA_ARTICULAR

## 2020-01-28 NOTE — Progress Notes (Signed)
Office Visit Note   Patient: Daniel Herrera           Date of Birth: April 18, 1940           MRN: [id number] Visit Date: 01/28/2020              Requested by: [name], NP [place],  [place] [address] PCP: [name], NP   Assessment & Plan: Visit Diagnoses:  1. Chronic pain of left knee     Plan: Impression is left knee medial meniscus tear.  Today, we proceeded with intra-articular cortisone injection.  He will follow up if he does not feel any relief.  Call with concerns or questions.  Follow-Up Instructions: Return if symptoms worsen or fail to improve.   Orders:  Orders Placed This Encounter  Procedures  . XR KNEE 3 VIEW LEFT   No orders of the defined types were placed in this encounter.     Procedures: Large Joint Inj: L knee on 01/28/2020 8:50 PM Details: 22 G needle Medications: 2 mL bupivacaine 0.5 %; 2 mL lidocaine 1 %; 40 mg methylPREDNISolone acetate 40 MG/ML Outcome: tolerated well, no immediate complications Patient was prepped and draped in the usual sterile fashion.       Clinical Data: No additional findings.   Subjective: Chief Complaint  Patient presents with  . Left Knee - Pain    HPI patient is a very pleasant 79 year old gentleman who comes in today for evaluation of left knee pain.  About a week ago, he was getting on and off his tractor several times in 1 day when he had increased pain to the medial aspect later that night.  His pain is not improved over the past week.  He denies any mechanical symptoms.  Pain is aggravated when he puts his foot down and twist.  He has tried over-the-counter anti-inflammatories without relief of symptoms.  No previous cortisone injection to the left knee.  Review of Systems as detailed in HPI.  All others reviewed and are negative.   Objective: Vital Signs: There were no vitals taken for this visit.  Physical Exam well-developed well-nourished gentleman in no acute  distress.  Alert oriented x3.  Ortho Exam examination of the left knee shows no effusion.  Marked tenderness the medial joint line.  Range of motion 0 to 120 degrees.  Ligaments are stable.  He is neurovascular intact distally.  Specialty Comments:  No specialty comments available.  Imaging: XR KNEE 3 VIEW LEFT  Result Date: 01/28/2020 X-rays are negative for fracture.  He does have chondrocalcinosis as well as moderate tricompartmental degenerative changes    PMFS History: Patient Active Problem List   Diagnosis Date Noted  . Diverticular hemorrhage   . Lower GI bleed 06/03/2019  . Acute lower GI bleeding 05/28/2019  . CAD (coronary artery disease) 05/28/2019  . Diverticulosis   . PAF (paroxysmal atrial fibrillation) ([place]) 06/13/2018  . S/P CABG x 3 05/28/2018  . NSTEMI (non-ST elevated myocardial infarction) ([place]) 05/21/2018  . Tachycardia determined by examination of pulse   . Pre-operative clearance 01/02/2018  . Hematochezia 12/21/2017  . CKD (chronic kidney disease) stage 3, GFR 30-59 ml/min 12/21/2017  . GI bleed 12/21/2017  . Hereditary angioedema type 2 ([place]) 07/28/2017  . Enterotoxigenic Escherichia coli infection 05/24/2015  . STEC (Shiga toxin-producing Escherichia coli) infection 05/24/2015  . Colitis 05/23/2015  . History of prostate cancer   . Chronic diarrhea of unknown origin   .  CAD S/P percutaneous coronary angioplasty   . Type 2 diabetes mellitus with unspecified complications ([place])   . Palpitations 03/15/2015  . History of non-ST elevation myocardial infarction (NSTEMI) 02/22/2015  . Abnormal EKG 02/21/2015  . Essential hypertension 02/21/2015  . Dyslipidemia, goal LDL below 70 02/21/2015  . Malignant neoplasm of prostate ([place]) 08/07/2014  . ILD (interstitial lung disease) ([place]) 12/17/2013   Past Medical History:  Diagnosis Date  . Angio-edema   . Anxiety   . Arthritis   . CAD (coronary artery disease)    a. 02/2015: DES to mid-LAD  .  Cataract   . Geographic tongue   . GERD (gastroesophageal reflux disease)   . Gout   . High cholesterol   . History of prolonged Q-T interval on ECG   . Hx of Clostridium difficile infection   . Hx of umbilical hernia repair   . Hypertension   . Myocardial infarction ([place])   . Personal history of digestive disease    gastric ulcer  . Prostate cancer ([place])   . Prostate enlargement   . Skin cancer    basal cell carcinoma  . Thyroid disease   . Urticaria     Family History  Problem Relation Age of Onset  . Cancer Mother   . Stomach cancer Mother   . Cancer Father        prostate  . Cancer Sister     Past Surgical History:  Procedure Laterality Date  . APPENDECTOMY    . BACK SURGERY    . CARDIAC CATHETERIZATION N/A 02/21/2015   Procedure: Left Heart Cath;  Surgeon: Lorretta Harp, MD;  Location: [facility];  Service: Cardiovascular;  Laterality: N/A;  . CATARACT EXTRACTION, BILATERAL    . CORONARY ARTERY BYPASS GRAFT N/A 05/24/2018   Procedure: CORONARY ARTERY BYPASS GRAFTING (CABG), ON PUMP, TIMES THREE, USING BILATERAL INTERNAL MAMMARY ARTERY AND HARVESTED LEFT RADIAL ARTERY;  Surgeon: Gaye Pollack, MD;  Location: [facility];  Service: Open Heart Surgery;  Laterality: N/A;  . LEFT HEART CATH AND CORONARY ANGIOGRAPHY N/A 05/22/2018   Procedure: LEFT HEART CATH AND CORONARY ANGIOGRAPHY;  Surgeon: Jettie Booze, MD;  Location: [facility];  Service: Cardiovascular;  Laterality: N/A;  . NASAL SINUS SURGERY    . PROSTATE BIOPSY    . RADIAL ARTERY HARVEST Left 05/24/2018   Procedure: RADIAL ARTERY HARVEST;  Surgeon: Gaye Pollack, MD;  Location: [facility];  Service: Open Heart Surgery;  Laterality: Left;  . right shoulder rotator cuff repair    . TEE WITHOUT CARDIOVERSION N/A 05/24/2018   Procedure: TRANSESOPHAGEAL ECHOCARDIOGRAM (TEE);  Surgeon: Gaye Pollack, MD;  Location: [facility];  Service: Open Heart Surgery;  Laterality: N/A;   Social History    Occupational History  . Occupation: retired  Tobacco Use  . Smoking status: Never Smoker  . Smokeless tobacco: Never Used  Vaping Use  . Vaping Use: Never used  Substance and Sexual Activity  . Alcohol use: Never  . Drug use: No  . Sexual activity: Not on file

## 2020-02-17 DIAGNOSIS — C61 Malignant neoplasm of prostate: Secondary | ICD-10-CM | POA: Diagnosis not present

## 2020-02-17 DIAGNOSIS — R310 Gross hematuria: Secondary | ICD-10-CM | POA: Diagnosis not present

## 2020-03-02 DIAGNOSIS — K8020 Calculus of gallbladder without cholecystitis without obstruction: Secondary | ICD-10-CM | POA: Diagnosis not present

## 2020-03-02 DIAGNOSIS — R310 Gross hematuria: Secondary | ICD-10-CM | POA: Diagnosis not present

## 2020-03-02 DIAGNOSIS — N200 Calculus of kidney: Secondary | ICD-10-CM | POA: Diagnosis not present

## 2020-03-23 DIAGNOSIS — D4121 Neoplasm of uncertain behavior of right ureter: Secondary | ICD-10-CM | POA: Diagnosis not present

## 2020-03-23 DIAGNOSIS — R310 Gross hematuria: Secondary | ICD-10-CM | POA: Diagnosis not present

## 2020-03-23 DIAGNOSIS — C61 Malignant neoplasm of prostate: Secondary | ICD-10-CM | POA: Diagnosis not present

## 2020-03-23 DIAGNOSIS — N210 Calculus in bladder: Secondary | ICD-10-CM | POA: Diagnosis not present

## 2020-03-29 ENCOUNTER — Ambulatory Visit: Payer: Medicare Other | Admitting: Allergy and Immunology

## 2020-03-30 ENCOUNTER — Other Ambulatory Visit: Payer: Self-pay

## 2020-03-30 DIAGNOSIS — M1990 Unspecified osteoarthritis, unspecified site: Secondary | ICD-10-CM | POA: Insufficient documentation

## 2020-03-30 DIAGNOSIS — T783XXA Angioneurotic edema, initial encounter: Secondary | ICD-10-CM | POA: Insufficient documentation

## 2020-03-30 DIAGNOSIS — E7800 Pure hypercholesterolemia, unspecified: Secondary | ICD-10-CM | POA: Insufficient documentation

## 2020-03-30 DIAGNOSIS — E079 Disorder of thyroid, unspecified: Secondary | ICD-10-CM | POA: Insufficient documentation

## 2020-03-30 DIAGNOSIS — I10 Essential (primary) hypertension: Secondary | ICD-10-CM | POA: Insufficient documentation

## 2020-03-30 DIAGNOSIS — N400 Enlarged prostate without lower urinary tract symptoms: Secondary | ICD-10-CM | POA: Insufficient documentation

## 2020-03-30 DIAGNOSIS — M109 Gout, unspecified: Secondary | ICD-10-CM | POA: Insufficient documentation

## 2020-03-30 DIAGNOSIS — F419 Anxiety disorder, unspecified: Secondary | ICD-10-CM | POA: Insufficient documentation

## 2020-03-30 DIAGNOSIS — Z87898 Personal history of other specified conditions: Secondary | ICD-10-CM | POA: Insufficient documentation

## 2020-03-30 DIAGNOSIS — Z8619 Personal history of other infectious and parasitic diseases: Secondary | ICD-10-CM | POA: Insufficient documentation

## 2020-03-30 DIAGNOSIS — Z8719 Personal history of other diseases of the digestive system: Secondary | ICD-10-CM | POA: Insufficient documentation

## 2020-03-30 DIAGNOSIS — Z9889 Other specified postprocedural states: Secondary | ICD-10-CM | POA: Insufficient documentation

## 2020-03-30 DIAGNOSIS — C61 Malignant neoplasm of prostate: Secondary | ICD-10-CM | POA: Insufficient documentation

## 2020-03-30 DIAGNOSIS — H269 Unspecified cataract: Secondary | ICD-10-CM | POA: Insufficient documentation

## 2020-03-30 DIAGNOSIS — I219 Acute myocardial infarction, unspecified: Secondary | ICD-10-CM | POA: Insufficient documentation

## 2020-03-30 DIAGNOSIS — L509 Urticaria, unspecified: Secondary | ICD-10-CM | POA: Insufficient documentation

## 2020-03-30 DIAGNOSIS — K219 Gastro-esophageal reflux disease without esophagitis: Secondary | ICD-10-CM | POA: Insufficient documentation

## 2020-03-30 DIAGNOSIS — C4490 Unspecified malignant neoplasm of skin, unspecified: Secondary | ICD-10-CM | POA: Insufficient documentation

## 2020-04-01 ENCOUNTER — Other Ambulatory Visit: Payer: Self-pay

## 2020-04-01 ENCOUNTER — Encounter: Payer: Self-pay | Admitting: Cardiology

## 2020-04-01 ENCOUNTER — Ambulatory Visit (INDEPENDENT_AMBULATORY_CARE_PROVIDER_SITE_OTHER): Payer: Medicare Other | Admitting: Cardiology

## 2020-04-01 VITALS — BP 136/72 | HR 68 | Ht 72.0 in | Wt 203.0 lb

## 2020-04-01 DIAGNOSIS — Z9861 Coronary angioplasty status: Secondary | ICD-10-CM | POA: Diagnosis not present

## 2020-04-01 DIAGNOSIS — I10 Essential (primary) hypertension: Secondary | ICD-10-CM

## 2020-04-01 DIAGNOSIS — E7800 Pure hypercholesterolemia, unspecified: Secondary | ICD-10-CM

## 2020-04-01 DIAGNOSIS — E118 Type 2 diabetes mellitus with unspecified complications: Secondary | ICD-10-CM

## 2020-04-01 DIAGNOSIS — J849 Interstitial pulmonary disease, unspecified: Secondary | ICD-10-CM | POA: Diagnosis not present

## 2020-04-01 DIAGNOSIS — I2510 Atherosclerotic heart disease of native coronary artery without angina pectoris: Secondary | ICD-10-CM | POA: Diagnosis not present

## 2020-04-01 NOTE — Progress Notes (Signed)
Cardiology Office Note:    Date:  04/01/2020   ID:  Daniel Herrera, DOB 1941/03/14, MRN [id number]  PCP:  Daniel Dandy, NP  Cardiologist:  Daniel Lindau, MD   Referring MD: Daniel Dandy, NP    ASSESSMENT:    1. High cholesterol   2. Essential hypertension   3. Coronary artery disease involving native coronary artery of native heart without angina pectoris   4. CAD S/P percutaneous coronary angioplasty   5. Type 2 diabetes mellitus with unspecified complications ([place])   6. ILD (interstitial lung disease) ([place])    PLAN:    In order of problems listed above:  1. Coronary artery disease: Secondary prevention stressed with the patient.  Importance of compliance with diet medication stressed any vocalized understanding.  He tries to exercise to the best of his ability given his pulmonary problems. 2. Essential hypertension: Blood pressure stable and diet was emphasized. 3. Mixed dyslipidemia: Diet was emphasized.  He is tolerating Livalo well.  He is also on Zetia.  Lab work was reviewed.  He does not want to add any other medications at this time I respect his wishes.  His LDL is not at goal. 4. Diabetes mellitus: Diet was emphasized.  Weight reduction was stressed.  He has abdominal obesity and I discussed this with him at length with him.  He promises to do better.  He tells me that he has been a little lax in the LAD system. 5. Patient will be seen in follow-up appointment in 6 months or earlier if the patient has any concerns    Medication Adjustments/Labs and Tests Ordered: Current medicines are reviewed at length with the patient today.  Concerns regarding medicines are outlined above.  No orders of the defined types were placed in this encounter.  No orders of the defined types were placed in this encounter.    No chief complaint on file.    History of Present Illness:    Daniel Herrera is a 79 y.o. male.  Patient has past medical history of coronary artery  disease, essential hypertension dyslipidemia diabetes mellitus and idiopathic pulmonary fibrosis.  He denies any problems at this time and takes care of activities of daily living.  No chest pain orthopnea or PND.  He is happy with his overall progress.  At the time of my evaluation, the patient is alert awake oriented and in no distress.  Past Medical History:  Diagnosis Date  . Angio-edema   . Anxiety   . Arthritis   . CAD (coronary artery disease)    a. 02/2015: DES to mid-LAD  . Cataract   . Geographic tongue   . GERD (gastroesophageal reflux disease)   . Gout   . High cholesterol   . History of prolonged Q-T interval on ECG   . Hx of Clostridium difficile infection   . Hx of umbilical hernia repair   . Hypertension   . Myocardial infarction ([place])   . Personal history of digestive disease    gastric ulcer  . Prostate cancer ([place])   . Prostate enlargement   . Skin cancer    basal cell carcinoma  . Thyroid disease   . Urticaria     Past Surgical History:  Procedure Laterality Date  . APPENDECTOMY    . BACK SURGERY    . CARDIAC CATHETERIZATION N/A 02/21/2015   Procedure: Left Heart Cath;  Surgeon: Lorretta Harp, MD;  Location: [facility];  Service: Cardiovascular;  Laterality: N/A;  . CATARACT EXTRACTION, BILATERAL    . CORONARY ARTERY BYPASS GRAFT N/A 05/24/2018   Procedure: CORONARY ARTERY BYPASS GRAFTING (CABG), ON PUMP, TIMES THREE, USING BILATERAL INTERNAL MAMMARY ARTERY AND HARVESTED LEFT RADIAL ARTERY;  Surgeon: Alleen Borne, MD;  Location: [facility] OR;  Service: Open Heart Surgery;  Laterality: N/A;  . LEFT HEART CATH AND CORONARY ANGIOGRAPHY N/A 05/22/2018   Procedure: LEFT HEART CATH AND CORONARY ANGIOGRAPHY;  Surgeon: Corky Crafts, MD;  Location: [facility] INVASIVE CV LAB;  Service: Cardiovascular;  Laterality: N/A;  . NASAL SINUS SURGERY    . PROSTATE BIOPSY    . RADIAL ARTERY HARVEST Left 05/24/2018   Procedure: RADIAL ARTERY HARVEST;  Surgeon: Alleen Borne, MD;  Location: [facility] OR;  Service: Open Heart Surgery;  Laterality: Left;  . right shoulder rotator cuff repair    . TEE WITHOUT CARDIOVERSION N/A 05/24/2018   Procedure: TRANSESOPHAGEAL ECHOCARDIOGRAM (TEE);  Surgeon: Alleen Borne, MD;  Location: [facility] OR;  Service: Open Heart Surgery;  Laterality: N/A;    Current Medications: Current Meds  Medication Sig  . aspirin EC 81 MG tablet Take 1 tablet (81 mg total) by mouth daily. Resume on 06/09/19  . azelastine (ASTELIN) 0.1 % nasal spray USE 1 SPRAY IN EACH NOSTRIL TWICE DAILY  . Cyanocobalamin (VITAMIN B-12) 5000 MCG TBDP Take 5,000 mcg by mouth daily.   [name] dextromethorphan-guaiFENesin (MUCINEX DM) 30-600 MG 12hr tablet Take 1 tablet by mouth in the morning and at bedtime.  [name] EPINEPHrine 0.3 mg/0.3 mL IJ SOAJ injection Use as directed for life-threatening allergic reaction.  [name] ezetimibe (ZETIA) 10 MG tablet Take 1 tablet (10 mg total) by mouth daily.  . fluoruracil (CARAC) 0.5 % cream Can apply to skin cancer for one to seven days as directed during flare-up.  . fluticasone (FLONASE) 50 MCG/ACT nasal spray Use one spray in each nostril twice daily as directed.  [name] icatibant (FIRAZYR) 30 MG/3ML injection Inject 3 mLs (30 mg total) into the skin once as needed (for sudden attacks of hereditary angioedema).  [name] levothyroxine (SYNTHROID) 100 MCG tablet Take 100 mcg by mouth daily before breakfast.  . metoprolol tartrate (LOPRESSOR) 25 MG tablet Take 0.5 tablets (12.5 mg total) by mouth 2 (two) times daily.  . montelukast (SINGULAIR) 10 MG tablet Take 1 tablet (10 mg total) by mouth at bedtime.  . nabumetone (RELAFEN) 750 MG tablet Take 1,500 mg by mouth daily.  . nitroGLYCERIN (NITROSTAT) 0.4 MG SL tablet DISSOLVE 1 TABLET UNDER TONGUE EVERY 5 MINUTES FOR 3 DOSES AS NEEDED CHEST PAIN. IF NO RELIEF CALL 911 (Patient taking differently: Place 0.4 mg under the tongue every 5 (five) minutes as needed for chest pain.)  . NONFORMULARY OR COMPOUNDED ITEM  Use as directed 5-15 mLs in the mouth or throat See admin instructions. Compounded mouth wash/gargle:1/1/1/1/1 (80 ml's of each) Purified water; Lidocaine Viscous; Nystatin suspension; Prednisolone 15/5; Maalox: Swish and spit using 5-15 ml's every 6 hours as needed for irritation  . nystatin (MYCOSTATIN) 100000 UNIT/ML suspension Take by mouth.  [name] OVER THE COUNTER MEDICATION Take 2 capsules by mouth See admin instructions. Health Plus/Super Colon Cleanse: Take 2 capsules by mouth in the morning  . pantoprazole (PROTONIX) 40 MG tablet TAKE 1 TABLET(40 MG) BY MOUTH TWICE DAILY  . Pitavastatin Calcium 4 MG TABS Take 4 mg by mouth at bedtime.   . Probiotic Product (PROBIOTIC DAILY PO) Take by mouth daily.  [name] TAKHZYRO 300 MG/2ML SOLN Inject 300  mg into the muscle every 14 (fourteen) days.     Allergies:   Lisinopril, Tape, Benadryl [diphenhydramine hcl], Darvocet [propoxyphene n-acetaminophen], Darvon [propoxyphene hcl], and Tylenol [acetaminophen]   Social History   Socioeconomic History  . Marital status: Married    Spouse name: Not on file  . Number of children: Not on file  . Years of education: Not on file  . Highest education level: Not on file  Occupational History  . Occupation: retired  Tobacco Use  . Smoking status: Never Smoker  . Smokeless tobacco: Never Used  Vaping Use  . Vaping Use: Never used  Substance and Sexual Activity  . Alcohol use: Never  . Drug use: No  . Sexual activity: Not on file  Other Topics Concern  . Not on file  Social History Narrative  . Not on file   Social Determinants of Health   Financial Resource Strain: Not on file  Food Insecurity: No Food Insecurity  . Worried About Programme researcher, broadcasting/film/video in the Last Year: Never true  . Ran Out of Food in the Last Year: Never true  Transportation Needs: No Transportation Needs  . Lack of Transportation (Medical): No  . Lack of Transportation (Non-Medical): No  Physical Activity: Not on file  Stress: Not  on file  Social Connections: Not on file     Family History: The patient's family history includes Cancer in his father, mother, and sister; Stomach cancer in his mother.  ROS:   Please see the history of present illness.    All other systems reviewed and are negative.  EKGs/Labs/Other Studies Reviewed:    The following studies were reviewed today: I discussed my findings with the patient extensively.   Recent Labs: 06/06/2019: ALT 20; BUN 14; Creatinine, Ser 1.48; Hemoglobin 10.5; Platelets 181; Potassium 3.5; Sodium 141  Recent Lipid Panel    Component Value Date/Time   CHOL 183 05/23/2019 0826   TRIG 179 (H) 05/23/2019 0826   HDL 44 05/23/2019 0826   CHOLHDL 4.2 05/23/2019 0826   CHOLHDL 3.7 05/24/2015 0725   VLDL 38 05/24/2015 0725   LDLCALC 108 (H) 05/23/2019 0826    Physical Exam:    VS:  BP 136/72   Pulse 68   Ht 6' (1.829 m)   Wt 203 lb (92.1 kg)   SpO2 98%   BMI 27.53 kg/m     Wt Readings from Last 3 Encounters:  04/01/20 203 lb (92.1 kg)  10/07/19 199 lb 6.4 oz (90.4 kg)  09/30/19 199 lb (90.3 kg)     GEN: Patient is in no acute distress HEENT: Normal NECK: No JVD; No carotid bruits LYMPHATICS: No lymphadenopathy CARDIAC: Hear sounds regular, 2/6 systolic murmur at the apex. RESPIRATORY:  Clear to auscultation without rales, wheezing or rhonchi  ABDOMEN: Soft, non-tender, non-distended MUSCULOSKELETAL:  No edema; No deformity  SKIN: Warm and dry NEUROLOGIC:  Alert and oriented x 3 PSYCHIATRIC:  Normal affect   Signed, Garwin Brothers, MD  04/01/2020 2:34 PM    [facility]

## 2020-04-01 NOTE — Patient Instructions (Signed)

## 2020-04-09 DIAGNOSIS — E1169 Type 2 diabetes mellitus with other specified complication: Secondary | ICD-10-CM | POA: Diagnosis not present

## 2020-04-09 DIAGNOSIS — E538 Deficiency of other specified B group vitamins: Secondary | ICD-10-CM | POA: Diagnosis not present

## 2020-04-09 DIAGNOSIS — E559 Vitamin D deficiency, unspecified: Secondary | ICD-10-CM | POA: Diagnosis not present

## 2020-04-09 DIAGNOSIS — D649 Anemia, unspecified: Secondary | ICD-10-CM | POA: Diagnosis not present

## 2020-04-09 DIAGNOSIS — E039 Hypothyroidism, unspecified: Secondary | ICD-10-CM | POA: Diagnosis not present

## 2020-04-09 DIAGNOSIS — J849 Interstitial pulmonary disease, unspecified: Secondary | ICD-10-CM | POA: Diagnosis not present

## 2020-04-09 DIAGNOSIS — Z6827 Body mass index (BMI) 27.0-27.9, adult: Secondary | ICD-10-CM | POA: Diagnosis not present

## 2020-04-09 DIAGNOSIS — N1831 Chronic kidney disease, stage 3a: Secondary | ICD-10-CM | POA: Diagnosis not present

## 2020-04-09 DIAGNOSIS — I10 Essential (primary) hypertension: Secondary | ICD-10-CM | POA: Diagnosis not present

## 2020-04-09 DIAGNOSIS — I2510 Atherosclerotic heart disease of native coronary artery without angina pectoris: Secondary | ICD-10-CM | POA: Diagnosis not present

## 2020-04-09 DIAGNOSIS — E785 Hyperlipidemia, unspecified: Secondary | ICD-10-CM | POA: Diagnosis not present

## 2020-04-09 DIAGNOSIS — D841 Defects in the complement system: Secondary | ICD-10-CM | POA: Diagnosis not present

## 2020-04-29 ENCOUNTER — Other Ambulatory Visit: Payer: Self-pay | Admitting: Allergy and Immunology

## 2020-04-30 DIAGNOSIS — R310 Gross hematuria: Secondary | ICD-10-CM | POA: Diagnosis not present

## 2020-04-30 DIAGNOSIS — N3000 Acute cystitis without hematuria: Secondary | ICD-10-CM | POA: Diagnosis not present

## 2020-04-30 DIAGNOSIS — R8271 Bacteriuria: Secondary | ICD-10-CM | POA: Diagnosis not present

## 2020-04-30 DIAGNOSIS — N210 Calculus in bladder: Secondary | ICD-10-CM | POA: Diagnosis not present

## 2020-05-14 DIAGNOSIS — N401 Enlarged prostate with lower urinary tract symptoms: Secondary | ICD-10-CM | POA: Diagnosis not present

## 2020-05-14 DIAGNOSIS — R351 Nocturia: Secondary | ICD-10-CM | POA: Diagnosis not present

## 2020-05-31 ENCOUNTER — Encounter: Payer: Self-pay | Admitting: Pulmonary Disease

## 2020-05-31 ENCOUNTER — Other Ambulatory Visit: Payer: Self-pay

## 2020-05-31 ENCOUNTER — Ambulatory Visit (INDEPENDENT_AMBULATORY_CARE_PROVIDER_SITE_OTHER): Payer: Medicare Other | Admitting: Pulmonary Disease

## 2020-05-31 DIAGNOSIS — J849 Interstitial pulmonary disease, unspecified: Secondary | ICD-10-CM | POA: Diagnosis not present

## 2020-05-31 DIAGNOSIS — R490 Dysphonia: Secondary | ICD-10-CM

## 2020-05-31 NOTE — Progress Notes (Signed)
   Subjective:    Patient ID: Daniel Herrera, male    DOB: 1940-09-01, 80 y.o.   MRN: [id number]  HPI   80 yonever smoker for FU of ILD/probable UIP  Past medical history-includesprostate CA,C diff colitis and SVTs that stop with vagal maneuvers,angioedema, CABG 06/2018 chronic hoarseness for many years, ENT evaluation  negative  He presented in 2015 with dyspnea for a few months,PFTs were normal except for mildly decreased DLCO.CT chest at [facility] showed mild interstitial prominence. This wasattributedto chicken farm exposure or GERD related pneumonitis  Annual follow-up, breathing is okay, he continues to have cough productive of minimal white phlegm, takes Mucinex sometimes twice daily. 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  He is now on 2 weekly injections of a medication to prevent angioedema -this is very expensive, about 22K per month  Did not desaturate on ambulation today  Significant tests/ events reviewed  HRCT chest 05/2019 -probable UIP,no definite honeycombing  CT chest without contrast 11/2013 showed interstitial prominence particularly apical with mild bronchiectasis, small patchy infiltrate was noted in the right lower lobe with a tiny effusion. ESR ,CCP neg  PFTs 09/2019 FVC stable at 94%, DLCO stable at 16.6/62% compared to 15.6 and 58% in 2020  PFTs 02/2014 - nml FEV1/ FVC & ratio, TLC 81%, DLCO 65%   Review of Systems neg for any significant sore throat, dysphagia, itching, sneezing, nasal congestion or excess/ purulent secretions, fever, chills, sweats, unintended wt loss, pleuritic or exertional cp, hempoptysis, orthopnea pnd or change in chronic leg swelling. Also denies presyncope, palpitations, heartburn, abdominal pain, nausea, vomiting, diarrhea or change in bowel or urinary habits, dysuria,hematuria, rash, arthralgias, visual complaints, headache, numbness weakness or ataxia.     Objective:    Physical Exam  Gen. Pleasant, well-nourished, in no distress ENT - no thrush, no pallor/icterus,no post nasal drip Neck: No JVD, no thyromegaly, no carotid bruits Lungs: no use of accessory muscles, no dullness to percussion, fine RT basal rales no rhonchi  Cardiovascular: Rhythm regular, heart sounds  normal, no murmurs or gallops, no peripheral edema Musculoskeletal: No deformities, no cyanosis or clubbing        Assessment & Plan:

## 2020-05-31 NOTE — Assessment & Plan Note (Addendum)
Clinically appears to be stable except for his chronic cough. We will proceed with high-resolution CT of the chest and compare this with 1 year ago for disease progression. We will hold off on PFTs at this time but check ambulatory saturation instead to make sure there is no oxygen desaturation We discussed course of IPF, not as a diagnosis I would still favor in spite of minimal disease progression in the past 7 years We again discussed antifibrotic's and he would like to avoid due to side effects of nausea or diarrhea

## 2020-05-31 NOTE — Patient Instructions (Signed)
  HRCT chest to follow up on fibrosis Ambulatory sat

## 2020-05-31 NOTE — Assessment & Plan Note (Signed)
Previous ENT evaluation has been negative

## 2020-06-16 ENCOUNTER — Ambulatory Visit (INDEPENDENT_AMBULATORY_CARE_PROVIDER_SITE_OTHER): Payer: Medicare Other | Admitting: Allergy and Immunology

## 2020-06-16 ENCOUNTER — Encounter: Payer: Self-pay | Admitting: Allergy and Immunology

## 2020-06-16 ENCOUNTER — Other Ambulatory Visit: Payer: Self-pay

## 2020-06-16 VITALS — BP 124/82 | HR 68 | Resp 16 | Ht 73.0 in | Wt 205.0 lb

## 2020-06-16 DIAGNOSIS — J3089 Other allergic rhinitis: Secondary | ICD-10-CM | POA: Diagnosis not present

## 2020-06-16 DIAGNOSIS — D841 Defects in the complement system: Secondary | ICD-10-CM | POA: Diagnosis not present

## 2020-06-16 DIAGNOSIS — J849 Interstitial pulmonary disease, unspecified: Secondary | ICD-10-CM

## 2020-06-16 DIAGNOSIS — K219 Gastro-esophageal reflux disease without esophagitis: Secondary | ICD-10-CM

## 2020-06-16 DIAGNOSIS — M3500 Sicca syndrome, unspecified: Secondary | ICD-10-CM | POA: Diagnosis not present

## 2020-06-16 MED ORDER — FLUOROURACIL 0.5 % EX CREA: TOPICAL_CREAM | CUTANEOUS | 2 refills | Status: DC

## 2020-06-16 MED ORDER — EPINEPHRINE 0.3 MG/0.3ML IJ SOAJ: INTRAMUSCULAR | 3 refills | Status: DC

## 2020-06-16 NOTE — Progress Notes (Signed)
[facility] [place] [facility] [place]   Follow-up Note  Referring Provider: [name], NP Primary Provider: [name], NP Date of Office Visit: 06/16/2020  Subjective:   Daniel Herrera (DOB: [birth year]) is a 80 y.o. male who returns to the [facility] on 06/16/2020 in re-evaluation of the following:  HPI: Kyel returns to this clinic and reevaluation of type II hereditary angioedema treated with [employer], history of allergic rhinitis, history of LPR, history of superficial skin cancer, and history of interstitial lung disease.  His last visit to this clinic was 15 December 2019.  He has not had any swelling episodes since I have seen him in this clinic last while consistently using Takhzryo.  He has not had any adverse effect from utilizing this biologic agent.  He does have Cirazyr available should he develop a swelling reaction.  He had very little problems with his nose recently.  His reflux is under very good control.  He visited with his pulmonologist regarding his ILD/IPF and he will be having a follow-up high-resolution chest CT scan in investigation of this issue.  He utilized his topical 5-fluorouracil for his superficial skin cancers 1 or 2 times per year which works great.  He has extremely dry mouth and dry throat.  He wakes up at nighttime and needs to drink water.  He uses cough drops at night.  When he wakes up in the morning time his tongue is stuck to the roof of his mouth.  He does have a history of dry eye syndrome which he treats with topical Systane multiple times a day.  Allergies as of 06/16/2020      Reactions   Lisinopril Anaphylaxis, Swelling, Other (See Comments)   Face, lips, and throat swell   Tape Other (See Comments)   PLASTIC TAPE TEARS OFF THE SKIN- Please use an alternative!!   Benadryl [diphenhydramine Hcl] Swelling, Other (See Comments)   Facial swelling from high dose (tolerates Advil PM as needed)    Darvocet [propoxyphene N-acetaminophen] Other (See Comments)   Hallucinations   Darvon [propoxyphene Hcl] Other (See Comments)   Hallucination   Tylenol [acetaminophen] Swelling, Other (See Comments)   Foot swelling      Medication List      aspirin EC 81 MG tablet Take 1 tablet (81 mg total) by mouth daily. Resume on 06/09/19   azelastine 0.1 % nasal spray Commonly known as: ASTELIN USE 1 SPRAY IN EACH NOSTRIL TWICE DAILY   dextromethorphan-guaiFENesin 30-600 MG 12hr tablet Commonly known as: MUCINEX DM Take 1 tablet by mouth in the morning and at bedtime.   EPINEPHrine 0.3 mg/0.3 mL Soaj injection Commonly known as: EPI-PEN Use as directed for life-threatening allergic reaction.   ezetimibe 10 MG tablet Commonly known as: ZETIA Take 1 tablet (10 mg total) by mouth daily.   fluoruracil 0.5 % cream Commonly known as: CARAC Can apply to skin cancer for one to seven days as directed during flare-up.   fluticasone 50 MCG/ACT nasal spray Commonly known as: FLONASE Use one spray in each nostril twice daily as directed.   icatibant 30 MG/3ML injection Commonly known as: FIRAZYR Inject 3 mLs (30 mg total) into the skin once as needed (for sudden attacks of hereditary angioedema).   levothyroxine 100 MCG tablet Commonly known as: SYNTHROID Take 100 mcg by mouth daily before breakfast.   metoprolol tartrate 25 MG tablet Commonly known as: LOPRESSOR Take 0.5 tablets (12.5 mg total)  by mouth 2 (two) times daily.   montelukast 10 MG tablet Commonly known as: SINGULAIR Take 1 tablet (10 mg total) by mouth at bedtime.   nabumetone 750 MG tablet Commonly known as: RELAFEN Take 1,500 mg by mouth daily.   nitroGLYCERIN 0.4 MG SL tablet Commonly known as: NITROSTAT DISSOLVE 1 TABLET UNDER TONGUE EVERY 5 MINUTES FOR 3 DOSES AS NEEDED CHEST PAIN. IF NO RELIEF CALL 911   NONFORMULARY OR COMPOUNDED ITEM Use as directed 5-15 mLs in the mouth or throat See admin instructions.  Compounded mouth wash/gargle:1/1/1/1/1 (80 ml's of each) Purified water; Lidocaine Viscous; Nystatin suspension; Prednisolone 15/5; Maalox: Swish and spit using 5-15 ml's every 6 hours as needed for irritation   nystatin 100000 UNIT/ML suspension Commonly known as: MYCOSTATIN Take by mouth.   OVER THE COUNTER MEDICATION Take 2 capsules by mouth See admin instructions. Health Plus/Super Colon Cleanse: Take 2 capsules by mouth in the morning   pantoprazole 40 MG tablet Commonly known as: PROTONIX TAKE 1 TABLET(40 MG) BY MOUTH TWICE DAILY   Pitavastatin Calcium 4 MG Tabs Take 4 mg by mouth at bedtime.   PROBIOTIC DAILY PO Take by mouth daily.   Takhzyro 300 MG/2ML Soln Generic drug: Lanadelumab-flyo Inject 300 mg into the muscle every 14 (fourteen) days.   Vitamin B-12 5000 MCG Tbdp Take 5,000 mcg by mouth daily.       Past Medical History:  Diagnosis Date   Angio-edema    Anxiety    Arthritis    CAD (coronary artery disease)    a. 02/2015: DES to mid-LAD   Cataract    Geographic tongue    GERD (gastroesophageal reflux disease)    Gout    High cholesterol    History of prolonged Q-T interval on ECG    Hx of Clostridium difficile infection    Hx of umbilical hernia repair    Hypertension    Myocardial infarction [facility])    Personal history of digestive disease    gastric ulcer   Prostate cancer ([place])    Prostate enlargement    Skin cancer    basal cell carcinoma   Thyroid disease    Urticaria     Past Surgical History:  Procedure Laterality Date   APPENDECTOMY     BACK SURGERY     CARDIAC CATHETERIZATION N/A 02/21/2015   Procedure: Left Heart Cath;  Surgeon: Lorretta Harp, MD;  Location: [facility];  Service: Cardiovascular;  Laterality: N/A;   CATARACT EXTRACTION, BILATERAL     CORONARY ARTERY BYPASS GRAFT N/A 05/24/2018   Procedure: CORONARY ARTERY BYPASS GRAFTING (CABG), ON PUMP, TIMES THREE, USING BILATERAL INTERNAL  MAMMARY ARTERY AND HARVESTED LEFT RADIAL ARTERY;  Surgeon: Gaye [facility]ack, MD;  Location: [facility];  Service: Open Heart Surgery;  Laterality: N/A;   LEFT HEART CATH AND CORONARY ANGIOGRAPHY N/A 05/22/2018   Procedure: LEFT HEART CATH AND CORONARY ANGIOGRAPHY;  Surgeon: Jettie Booze, MD;  Location: [facility] LAB;  Service: Cardiovascular;  Laterality: N/A;   NASAL SINUS SURGERY     PROSTATE BIOPSY     RADIAL ARTERY HARVEST Left 05/24/2018   Procedure: RADIAL ARTERY HARVEST;  Surgeon: Gaye [facility], MD;  Location: [facility];  Service: Open Heart Surgery;  Laterality: Left;   right shoulder rotator cuff repair     TEE WITHOUT CARDIOVERSION N/A 05/24/2018   Procedure: TRANSESOPHAGEAL ECHOCARDIOGRAM (TEE);  Surgeon: Gaye Pollack, MD;  Location: [facility];  Service: Open Heart Surgery;  Laterality: N/A;    Review of systems negative except as noted in HPI / PMHx or noted below:  Review of Systems  Constitutional: Negative.   HENT: Negative.   Eyes: Negative.   Respiratory: Negative.   Cardiovascular: Negative.   Gastrointestinal: Negative.   Genitourinary: Negative.   Musculoskeletal: Negative.   Skin: Negative.   Neurological: Negative.   Endo/Heme/Allergies: Negative.   Psychiatric/Behavioral: Negative.      Objective:   Vitals:   06/16/20 1344  BP: 124/82  Pulse: 68  Resp: 16  SpO2: 98%   Height: 6\' 1"  (185.4 cm)  Weight: 205 lb (93 kg)   Physical Exam Constitutional:      Appearance: He is not diaphoretic.  HENT:     Head: Normocephalic.     Right Ear: Tympanic membrane, ear canal and external ear normal.     Left Ear: Tympanic membrane, ear canal and external ear normal.     Nose: Nose normal. No mucosal edema or rhinorrhea.     Mouth/Throat:     Pharynx: Uvula midline. No oropharyngeal exudate.  Eyes:     Conjunctiva/sclera: Conjunctivae normal.  Neck:     Thyroid: No thyromegaly.     Trachea: Trachea normal. No tracheal tenderness or tracheal  deviation.  Cardiovascular:     Rate and Rhythm: Normal rate and regular rhythm.     Heart sounds: Normal heart sounds, S1 normal and S2 normal. No murmur heard.   Pulmonary:     Effort: No respiratory distress.     Breath sounds: Normal breath sounds. No stridor. No wheezing or rales.  Lymphadenopathy:     Head:     Right side of head: No tonsillar adenopathy.     Left side of head: No tonsillar adenopathy.     Cervical: No cervical adenopathy.  Skin:    Findings: No erythema or rash.     Nails: There is no clubbing.  Neurological:     Mental Status: He is alert.     Diagnostics: none  Assessment and Plan:   1. Sicca syndrome ([place])   2. Hereditary angioedema type 2 ([place])   3. Perennial allergic rhinitis   4. LPRD (laryngopharyngeal reflux disease)   5. ILD (interstitial lung disease) ([place])     1.  Continue Takhzryo every 14 days.   2.  Continue to Treat and prevent inflammation:    A.  Flonase - 1 spray each nostril twice a day  3.  Continue to Treat and prevent reflux:   A.  pantoprazole 40 mg twice a day  4.  If needed:   A.  Nasal saline spray  B.  OTC antihistamine - loratadine 10 mg 1 time per day  C.  OTC Mucinex DM - 2 tablets twice a day  D.  Firazyr injection for swelling reaction  E.  Carac topical - apply to skin cancer for 1-7 days during 'flare up'  F.  Azelastine - 1 spray each nostril twice a day  5.  Blood - ANA w/R for sicca syndrome  6.  Treatment for sicca syndrome???  7.  Return to clinic in 6 months or earlier if problem  [name] is doing quite well regarding his type II hereditary angioedema on his current medical plan and were not going to change any of that therapy and his upper airway issue and his reflux issue are doing well with the plan noted above.  He will follow-up with his pulmonologist regarding further evaluation of his  ILD/IPF.  He does have sicca syndrome.  We will check an ANA with reflex.  We will consider starting him on  pilocarpine or cevimeline once I have the results of that ANA available for review.  I will see him back in his clinic in 6 months or earlier if there is a problem.  Allena Katz, MD Allergy / Immunology [place]

## 2020-06-16 NOTE — Patient Instructions (Addendum)
  1.  Continue Takhzryo every 14 days.   2.  Continue to Treat and prevent inflammation:    A.  Flonase - 1 spray each nostril twice a day  3.  Continue to Treat and prevent reflux:   A.  pantoprazole 40 mg twice a day  4.  If needed:   A.  Nasal saline spray  B.  OTC antihistamine - loratadine 10 mg 1 time per day  C.  OTC Mucinex DM - 2 tablets twice a day  D.  Firazyr injection for swelling reaction  E.  Carac topical - apply to skin cancer for 1-7 days during 'flare up'  F.  Azelastine - 1 spray each nostril twice a day  5.  Blood - ANA w/R for sicca syndrome  6.  Treatment for sicca syndrome???  7.  Return to clinic in 6 months or earlier if problem

## 2020-06-17 ENCOUNTER — Encounter: Payer: Self-pay | Admitting: Allergy and Immunology

## 2020-06-18 LAB — ANA W/REFLEX IF POSITIVE: Anti Nuclear Antibody (ANA): NEGATIVE

## 2020-06-23 DIAGNOSIS — R338 Other retention of urine: Secondary | ICD-10-CM | POA: Diagnosis not present

## 2020-06-23 DIAGNOSIS — R310 Gross hematuria: Secondary | ICD-10-CM | POA: Diagnosis not present

## 2020-06-30 DIAGNOSIS — R310 Gross hematuria: Secondary | ICD-10-CM | POA: Diagnosis not present

## 2020-06-30 DIAGNOSIS — R338 Other retention of urine: Secondary | ICD-10-CM | POA: Diagnosis not present

## 2020-07-01 DIAGNOSIS — N200 Calculus of kidney: Secondary | ICD-10-CM | POA: Diagnosis not present

## 2020-07-01 DIAGNOSIS — R310 Gross hematuria: Secondary | ICD-10-CM | POA: Diagnosis not present

## 2020-07-01 DIAGNOSIS — Z8546 Personal history of malignant neoplasm of prostate: Secondary | ICD-10-CM | POA: Diagnosis not present

## 2020-07-01 DIAGNOSIS — N210 Calculus in bladder: Secondary | ICD-10-CM | POA: Diagnosis not present

## 2020-07-07 ENCOUNTER — Other Ambulatory Visit: Payer: Self-pay | Admitting: Urology

## 2020-07-19 NOTE — Patient Instructions (Addendum)
DUE TO COVID-19 ONLY ONE VISITOR IS ALLOWED TO COME WITH YOU AND STAY IN THE WAITING ROOM ONLY DURING PRE OP AND PROCEDURE DAY OF SURGERY. THE 1 VISITOR  MAY VISIT WITH YOU AFTER SURGERY IN YOUR PRIVATE ROOM DURING VISITING HOURS ONLY!  YOU NEED TO HAVE A COVID 19 TEST ON_4/21______ @_9 :00_____, THIS TEST MUST BE DONE BEFORE SURGERY,  COVID TESTING SITE  [place] Go in through the [facility] door. .ONCE YOUR COVID TEST IS COMPLETED,  PLEASE BEGIN THE QUARANTINE INSTRUCTIONS AS OUTLINED IN YOUR HANDOUT.                [name]    Your procedure is scheduled on: 07/26/20   Report to [facility] Main  Entrance   Report to short stay at 5:15 AM     Call this number if you have problems the morning of surgery [phone]    Remember: Do not eat food or drink liquids :After Midnight.   BRUSH YOUR TEETH MORNING OF SURGERY AND RINSE YOUR MOUTH OUT, NO CHEWING GUM CANDY OR MINTS.   Take these medicines the morning of surgery with A SIP OF WATER: Mucinex and levothyroxine, Metoprolol                                You may not have any metal on your body including               piercings  Do not wear jewelry,lotions, powders or deodorant              Men may shave face and neck.   Do not bring valuables to the hospital. [place].  Contacts, dentures or bridgework may not be worn into surgery.      Patients discharged the day of surgery will not be allowed to drive home.   IF YOU ARE HAVING SURGERY AND GOING HOME THE SAME DAY, YOU MUST HAVE AN ADULT TO DRIVE YOU HOME AND BE WITH YOU FOR 24 HOURS.  YOU MAY GO HOME BY TAXI OR UBER OR ORTHERWISE, BUT AN ADULT MUST ACCOMPANY YOU HOME AND STAY WITH YOU FOR 24 HOURS.  Name and phone number of your driver:  Special Instructions: N/A              Please read over the following fact sheets you were  given: _____________________________________________________________________             [facility] - Preparing for Surgery Before surgery, you can play an important role.  Because skin is not sterile, your skin needs to be as free of germs as possible.  You can reduce the number of germs on your skin by washing with CHG (chlorahexidine gluconate) soap before surgery.  CHG is an antiseptic cleaner which kills germs and bonds with the skin to continue killing germs even after washing. Please DO NOT use if you have an allergy to CHG or antibacterial soaps.  If your skin becomes reddened/irritated stop using the CHG and inform your nurse when you arrive at Short Stay.   You may shave your face/neck.  Please follow these instructions carefully:  1.  Shower with CHG Soap the night before surgery and the  morning of Surgery.  2.  If you choose to wash your hair,  wash your hair first as usual with your  normal  shampoo.  3.  After you shampoo, rinse your hair and body thoroughly to remove the  shampoo.                                        4.  Use CHG as you would any other liquid soap.  You can apply chg directly  to the skin and wash                       Gently with a scrungie or clean washcloth.  5.  Apply the CHG Soap to your body ONLY FROM THE NECK DOWN.   Do not use on face/ open                           Wound or open sores. Avoid contact with eyes, ears mouth and genitals (private parts).                       Wash face,  Genitals (private parts) with your normal soap.             6.  Wash thoroughly, paying special attention to the area where your surgery  will be performed.  7.  Thoroughly rinse your body with warm water from the neck down.  8.  DO NOT shower/wash with your normal soap after using and rinsing off  the CHG Soap.             9.  Pat yourself dry with a clean towel.            10.  Wear clean pajamas.            11.  Place clean sheets on your bed the night of your first  shower and do not  sleep with pets. Day of Surgery : Do not apply any lotions/deodorants the morning of surgery.  Please wear clean clothes to the hospital/surgery center.  FAILURE TO FOLLOW THESE INSTRUCTIONS MAY RESULT IN THE CANCELLATION OF YOUR SURGERY PATIENT SIGNATURE_________________________________  NURSE SIGNATURE__________________________________  ________________________________________________________________________

## 2020-07-20 ENCOUNTER — Other Ambulatory Visit: Payer: Self-pay

## 2020-07-20 ENCOUNTER — Encounter (HOSPITAL_COMMUNITY)
Admission: RE | Admit: 2020-07-20 | Discharge: 2020-07-20 | Disposition: A | Discharge status: Home / Self Care | Payer: Medicare Other | Source: Ambulatory Visit | Attending: Urology | Admitting: Urology

## 2020-07-20 ENCOUNTER — Encounter (HOSPITAL_COMMUNITY): Payer: Self-pay

## 2020-07-20 DIAGNOSIS — Z01818 Encounter for other preprocedural examination: Secondary | ICD-10-CM | POA: Diagnosis not present

## 2020-07-20 DIAGNOSIS — I509 Heart failure, unspecified: Secondary | ICD-10-CM | POA: Diagnosis not present

## 2020-07-20 DIAGNOSIS — I110 Hypertensive heart disease with heart failure: Secondary | ICD-10-CM | POA: Insufficient documentation

## 2020-07-20 DIAGNOSIS — R7303 Prediabetes: Secondary | ICD-10-CM | POA: Insufficient documentation

## 2020-07-20 DIAGNOSIS — I2510 Atherosclerotic heart disease of native coronary artery without angina pectoris: Secondary | ICD-10-CM | POA: Insufficient documentation

## 2020-07-20 DIAGNOSIS — N210 Calculus in bladder: Secondary | ICD-10-CM | POA: Diagnosis not present

## 2020-07-20 DIAGNOSIS — Z7982 Long term (current) use of aspirin: Secondary | ICD-10-CM | POA: Diagnosis not present

## 2020-07-20 DIAGNOSIS — I480 Paroxysmal atrial fibrillation: Secondary | ICD-10-CM | POA: Insufficient documentation

## 2020-07-20 DIAGNOSIS — Z79899 Other long term (current) drug therapy: Secondary | ICD-10-CM | POA: Diagnosis not present

## 2020-07-20 DIAGNOSIS — Z951 Presence of aortocoronary bypass graft: Secondary | ICD-10-CM | POA: Insufficient documentation

## 2020-07-20 HISTORY — DX: Cardiac arrhythmia, unspecified: I49.9

## 2020-07-20 HISTORY — DX: Interstitial pulmonary disease, unspecified: J84.9

## 2020-07-20 HISTORY — DX: Hypothyroidism, unspecified: E03.9

## 2020-07-20 HISTORY — DX: Personal history of urinary calculi: Z87.442

## 2020-07-20 HISTORY — DX: Prediabetes: R73.03

## 2020-07-20 HISTORY — DX: Peripheral vascular disease, unspecified: I73.9

## 2020-07-20 LAB — CBC
HCT: 44.5 % (ref 39.0–52.0)
Hemoglobin: 15 g/dL (ref 13.0–17.0)
MCH: 32.3 pg (ref 26.0–34.0)
MCHC: 33.7 g/dL (ref 30.0–36.0)
MCV: 95.7 fL (ref 80.0–100.0)
Platelets: 172 10*3/uL (ref 150–400)
RBC: 4.65 MIL/uL (ref 4.22–5.81)
RDW: 12.8 % (ref 11.5–15.5)
WBC: 6.5 10*3/uL (ref 4.0–10.5)
nRBC: 0 % (ref 0.0–0.2)

## 2020-07-20 LAB — BASIC METABOLIC PANEL
Anion gap: 7 (ref 5–15)
BUN: 17 mg/dL (ref 8–23)
CO2: 25 mmol/L (ref 22–32)
Calcium: 9.3 mg/dL (ref 8.9–10.3)
Chloride: 106 mmol/L (ref 98–111)
Creatinine, Ser: 1.26 mg/dL — ABNORMAL HIGH (ref 0.61–1.24)
GFR, Estimated: 57 mL/min — ABNORMAL LOW (ref >60)
Glucose, Bld: 120 mg/dL — ABNORMAL HIGH (ref 70–99)
Potassium: 4.3 mmol/L (ref 3.5–5.1)
Sodium: 138 mmol/L (ref 135–145)

## 2020-07-20 LAB — HEMOGLOBIN A1C
Hgb A1c MFr Bld: 6.2 % — ABNORMAL HIGH (ref 4.8–5.6)
Mean Plasma Glucose: 131.24 mg/dL

## 2020-07-20 NOTE — Progress Notes (Signed)
COVID Vaccine Completed:Yes Date COVID Vaccine completed:05/21/19-booster 01/12/20 COVID vaccine manufacturer: Pfizer   PCP - [name] NP Pulmonologist- Dr. Elsworth Soho Cardiologist - Dr. Alfonso Patten. [place]  Chest x-ray - no EKG - 07/20/20-chart, epic Stress Test - 04/14/15-epic ECHO - 06/16/18-epic Cardiac Cath - 2016,2020-epic CABG 2020 Pacemaker/ICD device last checked:NA  Sleep Study - no CPAP -   Fasting Blood Sugar - NA-pre diabetic Checks Blood Sugar _____ times a day  Blood Thinner Instructions:ASA/ Dr. Julianne Rice Aspirin Instructions:Stop 5 days prior to DOS/ Dr. Alinda Money Last Dose:07/20/20  Anesthesia review:   Patient denies shortness of breath, fever, cough and chest pain at PAT appointment Yes  Patient verbalized understanding of instructions that were given to them at the PAT appointment. Patient was also instructed that they will need to review over the PAT instructions again at home before surgery.yes  Pt reports that he has no SOB with any activities. He has Hereditary angioedema and intersctisial lung disease that are stable at this time.

## 2020-07-22 ENCOUNTER — Other Ambulatory Visit
Admission: RE | Admit: 2020-07-22 | Discharge: 2020-07-22 | Disposition: A | Discharge status: Home / Self Care | Payer: Medicare Other | Source: Ambulatory Visit | Attending: Urology | Admitting: Urology

## 2020-07-22 ENCOUNTER — Encounter (HOSPITAL_COMMUNITY): Payer: Self-pay

## 2020-07-22 ENCOUNTER — Other Ambulatory Visit: Payer: Self-pay

## 2020-07-22 ENCOUNTER — Inpatient Hospital Stay (HOSPITAL_COMMUNITY): Admission: RE | Admit: 2020-07-22 | Payer: Medicare Other | Source: Ambulatory Visit

## 2020-07-22 DIAGNOSIS — Z01812 Encounter for preprocedural laboratory examination: Secondary | ICD-10-CM | POA: Insufficient documentation

## 2020-07-22 DIAGNOSIS — Z20822 Contact with and (suspected) exposure to covid-19: Secondary | ICD-10-CM | POA: Diagnosis not present

## 2020-07-22 LAB — SARS CORONAVIRUS 2 (TAT 6-24 HRS): SARS Coronavirus 2: NEGATIVE

## 2020-07-22 NOTE — Progress Notes (Signed)
Anesthesia Chart Review:   Case: [id number] Date/Time: 07/26/20 0715   Procedure: CYSTOSCOPY WITH LITHOLAPAXY (N/A )   Anesthesia type: General   Pre-op diagnosis: BLADDER CALCULUS   Location: [place] / [facility]   Surgeons: Raynelle Bring, MD      DISCUSSION: Pt is 80 years old with hx CAD (s/p DES to LAD 2016 and CABG 05/24/18), CHF, PAF (post-CABG), HTN, pre-diabetes, interstitial lung disease   VS: BP (!) 167/79   Pulse 62   Temp 36.6 C (Oral)   Resp 20   Ht 6\' 1"  (1.854 m)   Wt 90.7 kg   SpO2 100%   BMI 26.39 kg/m    PROVIDERS: - PCP is [name], NP - Cardiologist is Jyl Heinz, MD. Last office visit 04/01/20 - Pulmonologist is Kara Mead, MD. Interstitial lung disease was stable at last office visit 05/31/20   LABS: Labs reviewed: Acceptable for surgery. (all labs ordered are listed, but only abnormal results are displayed)  Labs Reviewed  HEMOGLOBIN A1C - Abnormal; Notable for the following components:      Result Value   Hgb A1c MFr Bld 6.2 (*)    All other components within normal limits  BASIC METABOLIC PANEL - Abnormal; Notable for the following components:   Glucose, Bld 120 (*)    Creatinine, Ser 1.26 (*)    GFR, Estimated 57 (*)    All other components within normal limits  CBC     IMAGES: CT chest 05/19/19:  1. The appearance of the lungs is compatible with interstitial lung disease, with a spectrum of findings considered probable usual interstitial pneumonia (UIP) per current ATS guidelines. Repeat high-resolution chest CT is recommended in 12 months to assess for temporal changes in the appearance of the lung parenchyma. 2. Aortic atherosclerosis, in addition to three-vessel coronary artery disease. Status post median sternotomy for CABG including LIMA to the LAD. 3. Nonobstructive calculus measuring at least 3 mm in the upper pole collecting system of the right kidney. 4. Cholelithiasis versus biliary sludge lying dependently in the  gallbladder.   EKG 07/20/20: Sinus rhythm with 1st degree A-V block. Minimal voltage criteria for LVH, may be normal variant ( R in aVL )   CV: Echo 06/16/18:  1. The left ventricle has normal systolic function with an ejection fraction of 60-65%. The cavity size was normal. Left ventricular diastolic Doppler parameters are consistent with impaired relaxation. Indeterminate filling pressures No evidence of left ventricular regional wall motion abnormalities.  2. The right ventricle has normal systolic function. The cavity was normal. There is no increase in right ventricular wall thickness.  3. The mitral valve is grossly normal.  4. The tricuspid valve is grossly normal.  5. The aortic valve is grossly normal.  6. The aortic root is normal in size and structure.  7. No significant pericardial effusion. Prominent pericardial fat pad. No hemodynamic compromise.    Cardiac cath 05/22/18 (prior to CABG):   Previously placed Prox LAD to Mid LAD stent (unknown type) is widely patent.  1st Mrg lesion is 70% stenosed.  Prox RCA to Mid RCA lesion is 75% stenosed.  Prox Cx lesion is 100% stenosed.  Ost LAD to Prox LAD lesion is 75% stenosed.  Mid LAD lesion is 50% stenosed.  LV end diastolic pressure is normal.  There is no aortic valve stenosis.  - Three vessel CAD.  Plan for surgery consultation for CABG    Past Medical History:  Diagnosis Date  .  Anginal pain ([place]) 2016  . Angio-edema   . Anxiety   . Arthritis   . CAD (coronary artery disease)    a. 02/2015: DES to mid-LAD  . Cataract   . CHF (congestive heart failure) ([place]) 2020   after surgery only  . Dysrhythmia    A-fib  . Geographic tongue   . GERD (gastroesophageal reflux disease)   . Gout   . High cholesterol   . History of kidney stones   . History of prolonged Q-T interval on ECG   . Hx of Clostridium difficile infection   . Hx of umbilical hernia repair   . Hypertension   . Hypothyroidism   .  Myocardial infarction ([place]) 2016  . Peripheral vascular disease ([facility])    vericose veins  . Personal history of digestive disease    gastric ulcer  . Pre-diabetes   . Prostate cancer ([place])   . Prostate enlargement   . Skin cancer    basal cell carcinoma  . Thyroid disease   . Urticaria     Past Surgical History:  Procedure Laterality Date  . APPENDECTOMY    . BACK SURGERY    . CARDIAC CATHETERIZATION N/A 02/21/2015   Procedure: Left Heart Cath;  Surgeon: Lorretta Harp, MD;  Location: [place] [facility];  Service: Cardiovascular;  Laterality: N/A;  . CATARACT EXTRACTION, BILATERAL    . CORONARY ARTERY BYPASS GRAFT N/A 05/24/2018   Procedure: CORONARY ARTERY BYPASS GRAFTING (CABG), ON PUMP, TIMES THREE, USING BILATERAL INTERNAL MAMMARY ARTERY AND HARVESTED LEFT RADIAL ARTERY;  Surgeon: Gaye Pollack, MD;  Location: [place];  Service: Open Heart Surgery;  Laterality: N/A;  . LEFT HEART CATH AND CORONARY ANGIOGRAPHY N/A 05/22/2018   Procedure: LEFT HEART CATH AND CORONARY ANGIOGRAPHY;  Surgeon: Jettie Booze, MD;  Location: [place] [facility];  Service: Cardiovascular;  Laterality: N/A;  . NASAL SINUS SURGERY    . PROSTATE BIOPSY    . RADIAL ARTERY HARVEST Left 05/24/2018   Procedure: RADIAL ARTERY HARVEST;  Surgeon: Gaye Pollack, MD;  Location: [place];  Service: Open Heart Surgery;  Laterality: Left;  . right shoulder rotator cuff repair    . TEE WITHOUT CARDIOVERSION N/A 05/24/2018   Procedure: TRANSESOPHAGEAL ECHOCARDIOGRAM (TEE);  Surgeon: Gaye Pollack, MD;  Location: [place];  Service: Open Heart Surgery;  Laterality: N/A;    MEDICATIONS: . aspirin EC 81 MG tablet  . azelastine (ASTELIN) 0.1 % nasal spray  . Cyanocobalamin (VITAMIN B-12) 5000 MCG TBDP  . dextromethorphan-guaiFENesin (MUCINEX DM) 30-600 MG 12hr tablet  . docusate sodium (COLACE) 100 MG capsule  . EPINEPHrine 0.3 mg/0.3 mL IJ SOAJ injection  . ezetimibe (ZETIA) 10 MG tablet  . fluoruracil (CARAC) 0.5  % cream  . fluticasone (FLONASE) 50 MCG/ACT nasal spray  . icatibant (FIRAZYR) 30 MG/3ML injection  . levothyroxine (SYNTHROID) 100 MCG tablet  . metoprolol succinate (TOPROL-XL) 25 MG 24 hr tablet  . metoprolol tartrate (LOPRESSOR) 25 MG tablet  . montelukast (SINGULAIR) 10 MG tablet  . nabumetone (RELAFEN) 750 MG tablet  . nitroGLYCERIN (NITROSTAT) 0.4 MG SL tablet  . NONFORMULARY OR COMPOUNDED ITEM  . nystatin (MYCOSTATIN) 100000 UNIT/ML suspension  . OVER THE COUNTER MEDICATION  . pantoprazole (PROTONIX) 40 MG tablet  . Probiotic Product (PROBIOTIC DAILY PO)  . TAKHZYRO 300 MG/2ML SOLN   No current facility-administered medications for this encounter.    If no changes, I anticipate pt can proceed with surgery as scheduled.  Willeen Cass, PhD, FNP-BC [facility] Short [facility] Surgical [facility]/Anesthesiology Phone: [phone] 07/22/2020 9:16 AM

## 2020-07-22 NOTE — Anesthesia Preprocedure Evaluation (Addendum)
Anesthesia Evaluation  Patient identified by MRN, date of birth, ID band Patient awake    Reviewed: Allergy & Precautions, NPO status , Patient's Chart, lab work & pertinent test results, reviewed documented beta blocker date and time   Airway Mallampati: I  TM Distance: >3 FB Neck ROM: Full    Dental  (+) Teeth Intact, Dental Advisory Given   Pulmonary neg pulmonary ROS,    breath sounds clear to auscultation       Cardiovascular hypertension, Pt. on home beta blockers + CAD, + Past MI, + Cardiac Stents, + CABG, + Peripheral Vascular Disease and +CHF  + dysrhythmias Atrial Fibrillation  Rhythm:Irregular Rate:Normal     Neuro/Psych PSYCHIATRIC DISORDERS Anxiety negative neurological ROS     GI/Hepatic Neg liver ROS, GERD  Medicated,  Endo/Other  diabetesHypothyroidism   Renal/GU Renal disease     Musculoskeletal  (+) Arthritis ,   Abdominal Normal abdominal exam  (+)   Peds  Hematology   Anesthesia Other Findings   Reproductive/Obstetrics                           Anesthesia Physical Anesthesia Plan  ASA: III  Anesthesia Plan: General   Post-op Pain Management:    Induction: Intravenous  PONV Risk Score and Plan: 3 and Ondansetron, Dexamethasone and Treatment may vary due to age or medical condition  Airway Management Planned: LMA  Additional Equipment: None  Intra-op Plan:   Post-operative Plan: Extubation in OR  Informed Consent: I have reviewed the patients History and Physical, chart, labs and discussed the procedure including the risks, benefits and alternatives for the proposed anesthesia with the patient or authorized representative who has indicated his/her understanding and acceptance.     Dental advisory given  Plan Discussed with: CRNA  Anesthesia Plan Comments: (See APP note by Durel Salts, FNP )      Anesthesia Quick Evaluation

## 2020-07-23 NOTE — H&P (Signed)
Office Visit Report     06/30/2020   --------------------------------------------------------------------------------   Daniel Herrera  MRN: [id number]  DOB: 1940/05/27, 80 year old Male  SSN: [id number]   PRIMARY CARE:  [name], NP  REFERRING:  Daine Gravel, NP  PROVIDER:  Raynelle Bring, M.D.  TREATING:  Daine Gravel, NP  LOCATION:  [facility] [phone]     --------------------------------------------------------------------------------   CC/HPI: 1. Locally advanced prostate cancer  2. Gross hematuria   Mr. Daniel Herrera returns today after last having seen me in September of 2019. Be his last PSA was drawn in September of 2021 and fortunately was 0.1 indicating no concern for recurrence. He did suffer a GI bleed last February resulting in hospitalization. More recently, he has noted intermittent episodes of painless gross hematuria. He was seen by [name], NP, in November in did undergo a hematuria protocol CT scan. Findings are as below but demonstrated nonobstructing renal calculi and a bladder calculus. Although there was mention of a possible small urothelial lesion of the mid right ureter, this was not definite and there is no significant dilation of the right ureter.   Interval: Patient with above noted past medical history. He completed a full hematuria evaluation. Findings were significant for a 12 mm bladder calculus, bladder diverticuli, possible small urothelial lesion of the mid right ureter. He presents today with increased frequency of urination as well as hematuria. He denies large blood clots but has been put passing small clots. He denies fevers and chills. He reports that he feels well overall. His urinalysis today does have bacteriuria. He denies any unilateral flank pain or discomfort.   06/23/2020: He returns today with concerns of intermittent gross hematuria. Since his last office visit 1 month ago he has noticed blood in his urine for  different times. He reports the blood last anywhere from 1-3 days. This time the blood was associated with large clots and significant amounts of pain. He is very concerned about this and presents today for evaluation. He has undergone radiation treatment as well as as a hematuria evaluation as noted above. His PVR today is greater than 300. He states he feels uncomfortable and has to significantly strain to urinate. He is also experiencing frequency of every 15-30 minutes. He denies fevers and chills. He denies nausea and vomiting. He has no complaints of unilateral flank pain or discomfort. He does have a significant history of kidney stones but reports he has not felt like he was passing a stone at this time.   06/30/2020: He returns today for a voiding trial. He does have a catheter placed at this time. He reports that he has noticed a lot of blood clots within the catheter bag and at 1 point catheter even became clogged requiring flushing. He was able to do this at home in clear his catheter from obstruction. He reports over the last 2 days his catheter has been clear. He denies fevers, chills and nausea and vomiting. His labs last week were normal. HE denies SOB, dizziness and chest pain. He deines unusual fatigue.     ALLERGIES: Benadryl Allergy CHEW Darvocet Darvon Tylenol    MEDICATIONS: Aspir 81  Baclofen  Ezetimibe  Fluticasone Propionate  Icatibant 30 mg/3 ml syringe  Levothyroxine Sodium 75 mcg tablet Oral  Livalo 4 mg tablet Oral  Metoprolol Tartrate 25 mg tablet Oral  Montelukast Sodium  Mucinex Dm  Nabumetone  Nitroglycerin 0.4 mg tablet, sublingual  Pantoprazole Sodium  Tadalafil  2.5 mg tablet 1 tablet PO Daily  Takhzyro     GU PSH: Cystoscopy - 03/23/2020 Locm 300-399Mg/Ml Iodine,1Ml - 03/02/2020 Vasectomy - 2008       PSH Notes: Varicose Vein Ligation, Sinus Surgery, Hernia Repair, Surgery Of Male Genitalia Vasectomy, Shoulder Surgery, Back Surgery   NON-GU PSH:  Hernia Repair - 2016 Ligate/divide/excise Vein - 2016     GU PMH: Gross hematuria - 06/23/2020, - 04/30/2020, - 03/23/2020, - 03/02/2020, - 02/17/2020 Urinary Retention - 06/23/2020 BPH w/LUTS - 05/14/2020, Benign localized prostatic hyperplasia with lower urinary tract symptoms (LUTS), - 2016 Acute Cystitis/UTI - 04/30/2020 Bladder Stone - 04/30/2020, - 03/23/2020 Prostate Cancer - 03/23/2020, - 02/17/2020, - 2017, Adenocarcinoma of prostate, - 2016 Ureter, Right, Neoplasm of uncertain behavior - 15/08/6977 Nocturia (Stable) - 2017, Nocturia, - 2014 Urinary Urgency, Urinary urgency - 2016 Renal calculus, Nephrolithiasis - 2014      PMH Notes:   1) Prostate cancer: He was noted to have a rise in his PSA to 8.8 while on Avodart prompting urologic evaluation and a prostate needle biopsy by Dr. Janice Norrie on 06/22/14 that confirmed Gleason 4+4=8 adenocarcinoma with 11 out of 12 biopsy cores positive for malignancy with evidence of perineural invasion in multiple cores and extraprostatic extension at the right lateral base core. His DRE was normal. His staging studies include a bone scan (07/13/14) and CT scan (07/20/14) both without evidence of metastatic disease. He was treated with IMRT under the care of Dr. [name] and 2 years of androgen deprivation.   His medical history is significant for a history of atrial fibrillation, hypothyroidism, gastric ulcers, gout, hypercholesterolemia,   TNM stage: cT3a N0 M0  PSA: 8.8 (on 5 ARI)  Gleason score: 4+4=8  Biopsy (06/22/14): 11/12 cores positive  Left: L lateral apex (40%, 3+4=7), L apex (30%, 4+3=7), L lateral mid (40%, 4+3=7), L mid (20%, 4+3=7), L lateral base (20%, 3+4=7, PNI) L base (10%, 4+3=7)  Right: R lateral apex (10%, 3+3=6), R mid (40%, 4+4=8), R lateral mid (70%, 4+3=7, PNI), R base (70%, 4+4=8, PNI), R lateral base ( 60%, 4+3=7, PNI, EPE)   Prostate volume: 22.2 cc   Baseline urinary function: He has minimal lower urinary tract symptoms. IPSS  is 1.  Baseline erectile function: This is a low priority. SHIM score is 5.   2) BPH/LUTS: He had been on Avodart for many years under the care of Dr. Janice Norrie. He stopped this in June 2018.   3) Hematuria: He developed new onset gross hematuria in November 2021.   Nov 2021: CT imaging - Questionable left ureteral filling defect, bladder calculus, cystoscopy - bladder calculus   NON-GU PMH: Myocardial Infarction, History of myocardial infarction - 2016 Anxiety, Anxiety - 2016 Personal history of other diseases of the digestive system, History of gastric ulcer - 2016 Hypothyroidism, Primary Hypothyroidism - 2014 Atrial Fibrillation GERD Gout Hypercholesterolemia Idiopathic pulmonary fibrosis    FAMILY HISTORY: Breast Cancer - Mother Cancer - Mother Death of family member - Father, Mother Esophageal Cancer - Mother Heart Disease - Mother   SOCIAL HISTORY: Marital Status: Married Preferred Language: English; Ethnicity: Not Hispanic Or Latino; Race: White Current Smoking Status: Patient has never smoked.  Does not use smokeless tobacco. Has never drank.  Does not use drugs. Does not drink caffeine.     Notes: Never a smoker, Alcohol use, Married, Retired, Marital History - Currently Married   REVIEW OF SYSTEMS:    GU Review Male:   Gross hematuria. Patient denies frequent  urination, hard to postpone urination, burning/ pain with urination, get up at night to urinate, leakage of urine, stream starts and stops, trouble starting your stream, have to strain to urinate , erection problems, and penile pain.  Gastrointestinal (Upper):   Patient denies nausea, vomiting, and indigestion/ heartburn.  Gastrointestinal (Lower):   Patient denies diarrhea and constipation.  Constitutional:   Patient denies fever, night sweats, weight loss, and fatigue.  Musculoskeletal:   Patient denies back pain and joint pain.  Neurological:   Patient denies headaches and dizziness.  Psychologic:   Patient  denies anxiety and depression.   VITAL SIGNS:      06/30/2020 07:53 AM  Weight 200 lb / 90.72 kg  Height 73 in / 185.42 cm  BP 166/79 mmHg  Pulse 71 /min  Temperature 97.3 F / 36.2 C  BMI 26.4 kg/m   GU PHYSICAL EXAMINATION:    Penis: Penile foley catheter present. draining yellow urine.    Notes: NO CVAT   MULTI-SYSTEM PHYSICAL EXAMINATION:    Constitutional: Well-nourished. No physical deformities. Normally developed. Good grooming.  Respiratory: No labored breathing, no use of accessory muscles.   Cardiovascular: Normal temperature, normal extremity pulses, no swelling, no varicosities.  Skin: No paleness, no jaundice, no cyanosis. No lesion, no ulcer, no rash.  Neurologic / Psychiatric: Oriented to time, oriented to place, oriented to person. No depression, no anxiety, no agitation.  Musculoskeletal: Normal gait and station of head and neck.     Complexity of Data:  Source Of History:  Patient, Family/Caregiver  Lab Test Review:   BMP, Hemoglobin and Hematocrit  Records Review:   Previous Doctor Records, Previous Patient Records  Urine Test Review:   Urinalysis, Urine Culture   12/09/19 05/26/19 07/20/18 11/27/17 04/25/17 09/20/16 03/15/16 09/08/15  PSA  Total PSA 0.1 ng/ml 0.2 ng/dl < 0.1 ng/dl 0.018 ng/mL <0.015 ng/mL <0.02 ng/mL < 0.015  < 0.09     04/25/17 03/04/15  Hormones  Testosterone, Total 20.4 ng/dL 26     06/23/20  General Chemistry  Sodium 136 mEq/L  Potassium 4.0 mEq/L  BUN 21 mg/dL  Creatinine 1.1 mg/dL  Chloride 101 mEq/L  CO2 31 mEq/L  Glucose 104 mg/dL  Calcium 9.4 mg/dL  eGFR African American 72.6   eGFR Non-Afr. American 62.6   CBC  Hemoglobin 14.5 g/dL  Hematocrit % 42.4 %  Urinalysis  Urine Appearance Turbid   Urine Color Red   Urine Glucose Invalid mg/dL  Urine Bilirubin Invalid mg/dL  Urine Ketones Invalid mg/dL  Urine Specific Gravity Invalid   Urine Blood Invalid ery/uL  Urine pH Invalid   Urine Protein Invalid mg/dL  Urine  Urobilinogen Invalid mg/dL  Urine Nitrites Invalid   Urine Leukocyte Esterase Invalid leu/uL  Urine WBC/hpf 0 - 5/hpf   Urine RBC/hpf >60/hpf   Urine Epithelial Cells NS (Not Seen)   Urine Bacteria Rare (0-9/hpf)   Urine Mucous Not Present   Urine Yeast NS (Not Seen)   Urine Trichomonas Not Present   Urine Cystals NS (Not Seen)   Urine Casts NS (Not Seen)   Urine Sperm Not Present   Urine C&S  Culture, Urine -    PROCEDURES:         Voiding Trial - 51700  Voided Volume: 150 cc  Instilled Volume: 210 cc   ASSESSMENT:      ICD-10 Details  1 GU:   Gross hematuria - N46.2 Acute, Uncomplicated  2   Urinary Retention - R33.8 Acute, Systemic Symptoms  PLAN:           Schedule Return Visit/Planned Activity: Keep Scheduled Appointment - Office Visit, Follow up MD          Document Letter(s):  Created for Patient: Clinical Summary         Notes:   Successful trial of void. Continue to adequately hydrate. He will keep his scheduled CT scan tomorrow. I will notify him of results. Strict return precautions for worsening symptoms advised. He verbalized his understanding.         Next Appointment:      Next Appointment: 09/29/2020 02:30 PM    Appointment Type: Office Visit Established Patient    Location: [facility] [phone] [id number]    Provider: Raynelle Bring, M.D.    Reason for Visit: 6 mo recheck      * Signed by Daine Gravel, NP on 06/30/20 at 6:39 PM (EDT)*       APPENDED NOTES:  Home Phone: [phone]  Work Phone:   Regarding: Bladder stone  Discussion Occurred With: Patient (Sami C. Pinnix)   Message:  Mr. Kindt has had recurrent hematuria. After repeat CT scan was performed, there are no findings that suggested worsening enhancement and thickening of the ureter as questioned on his previous CT scan. He does have a persistent bladder calculus. The most likely explanation for his hematuria is likely radiation cystitis with irritation  from his bladder calculus. As such, we have discussed proceeding with treatment any does wish to proceed with cystolitholapaxy. The potential risks, complications, and expected recovery process were discussed in detail. He gives informed consent would like to schedule this for later in April. He will continue CIC in the meantime which may help to prevent future bladder stone formation. We will further discuss his bladder emptying with finds them follow-up at his scheduled appointment in June with a PVR.    * Signed by Raynelle Bring, M.D. on 07/06/20 at 1:44 PM (EDT)*      * Signed by Raynelle Bring, M.D. on 07/23/20 at 6:40 PM (EDT)*

## 2020-07-26 ENCOUNTER — Encounter (HOSPITAL_COMMUNITY): Payer: Self-pay | Admitting: Urology

## 2020-07-26 ENCOUNTER — Ambulatory Visit (HOSPITAL_COMMUNITY): Payer: Medicare Other

## 2020-07-26 ENCOUNTER — Ambulatory Visit (HOSPITAL_COMMUNITY)
Admission: RE | Admit: 2020-07-26 | Discharge: 2020-07-26 | Disposition: A | Discharge status: Home / Self Care | Payer: Medicare Other | Attending: Urology | Admitting: Urology

## 2020-07-26 ENCOUNTER — Encounter (HOSPITAL_COMMUNITY)
Admission: RE | Disposition: A | Discharge status: Home / Self Care | Payer: Self-pay | Source: Home / Self Care | Attending: Urology

## 2020-07-26 ENCOUNTER — Ambulatory Visit (HOSPITAL_COMMUNITY): Payer: Medicare Other | Admitting: Emergency Medicine

## 2020-07-26 DIAGNOSIS — E7800 Pure hypercholesterolemia, unspecified: Secondary | ICD-10-CM | POA: Diagnosis not present

## 2020-07-26 DIAGNOSIS — N210 Calculus in bladder: Secondary | ICD-10-CM | POA: Insufficient documentation

## 2020-07-26 DIAGNOSIS — Z87442 Personal history of urinary calculi: Secondary | ICD-10-CM | POA: Insufficient documentation

## 2020-07-26 DIAGNOSIS — Z809 Family history of malignant neoplasm, unspecified: Secondary | ICD-10-CM | POA: Insufficient documentation

## 2020-07-26 DIAGNOSIS — N401 Enlarged prostate with lower urinary tract symptoms: Secondary | ICD-10-CM | POA: Diagnosis not present

## 2020-07-26 DIAGNOSIS — N200 Calculus of kidney: Secondary | ICD-10-CM | POA: Diagnosis not present

## 2020-07-26 DIAGNOSIS — Z510 Encounter for antineoplastic radiation therapy: Secondary | ICD-10-CM | POA: Diagnosis not present

## 2020-07-26 DIAGNOSIS — Z8711 Personal history of peptic ulcer disease: Secondary | ICD-10-CM | POA: Diagnosis not present

## 2020-07-26 DIAGNOSIS — Z803 Family history of malignant neoplasm of breast: Secondary | ICD-10-CM | POA: Insufficient documentation

## 2020-07-26 DIAGNOSIS — N183 Chronic kidney disease, stage 3 unspecified: Secondary | ICD-10-CM | POA: Diagnosis not present

## 2020-07-26 DIAGNOSIS — Z886 Allergy status to analgesic agent status: Secondary | ICD-10-CM | POA: Diagnosis not present

## 2020-07-26 DIAGNOSIS — I130 Hypertensive heart and chronic kidney disease with heart failure and stage 1 through stage 4 chronic kidney disease, or unspecified chronic kidney disease: Secondary | ICD-10-CM | POA: Diagnosis not present

## 2020-07-26 DIAGNOSIS — E1122 Type 2 diabetes mellitus with diabetic chronic kidney disease: Secondary | ICD-10-CM | POA: Diagnosis not present

## 2020-07-26 DIAGNOSIS — I4891 Unspecified atrial fibrillation: Secondary | ICD-10-CM | POA: Insufficient documentation

## 2020-07-26 DIAGNOSIS — Z79899 Other long term (current) drug therapy: Secondary | ICD-10-CM | POA: Diagnosis not present

## 2020-07-26 DIAGNOSIS — R338 Other retention of urine: Secondary | ICD-10-CM | POA: Diagnosis not present

## 2020-07-26 DIAGNOSIS — Z791 Long term (current) use of non-steroidal anti-inflammatories (NSAID): Secondary | ICD-10-CM | POA: Insufficient documentation

## 2020-07-26 DIAGNOSIS — E039 Hypothyroidism, unspecified: Secondary | ICD-10-CM | POA: Insufficient documentation

## 2020-07-26 DIAGNOSIS — R310 Gross hematuria: Secondary | ICD-10-CM | POA: Insufficient documentation

## 2020-07-26 DIAGNOSIS — Z8546 Personal history of malignant neoplasm of prostate: Secondary | ICD-10-CM | POA: Insufficient documentation

## 2020-07-26 DIAGNOSIS — N3041 Irradiation cystitis with hematuria: Secondary | ICD-10-CM | POA: Diagnosis not present

## 2020-07-26 DIAGNOSIS — I509 Heart failure, unspecified: Secondary | ICD-10-CM | POA: Diagnosis not present

## 2020-07-26 DIAGNOSIS — Z800 Family history of malignant neoplasm of digestive organs: Secondary | ICD-10-CM | POA: Insufficient documentation

## 2020-07-26 DIAGNOSIS — Z8249 Family history of ischemic heart disease and other diseases of the circulatory system: Secondary | ICD-10-CM | POA: Insufficient documentation

## 2020-07-26 DIAGNOSIS — Z885 Allergy status to narcotic agent status: Secondary | ICD-10-CM | POA: Insufficient documentation

## 2020-07-26 HISTORY — PX: CYSTOSCOPY WITH LITHOLAPAXY: SHX1425

## 2020-07-26 HISTORY — PX: HOLMIUM LASER APPLICATION: SHX5852

## 2020-07-26 SURGERY — CYSTOSCOPY, WITH BLADDER CALCULUS LITHOLAPAXY
Anesthesia: General

## 2020-07-26 MED ORDER — PROPOFOL 10 MG/ML IV BOLUS
INTRAVENOUS | Status: DC | PRN
  Administered 2020-07-26: 10 mg via INTRAVENOUS
  Administered 2020-07-26: 100 mg via INTRAVENOUS
  Administered 2020-07-26: 10 mg via INTRAVENOUS

## 2020-07-26 MED ORDER — FENTANYL CITRATE (PF) 250 MCG/5ML IJ SOLN
INTRAMUSCULAR | Status: AC
  Filled 2020-07-26: qty 5

## 2020-07-26 MED ORDER — PROPOFOL 10 MG/ML IV BOLUS
INTRAVENOUS | Status: AC
  Filled 2020-07-26: qty 40

## 2020-07-26 MED ORDER — STERILE WATER FOR IRRIGATION IR SOLN
Status: DC | PRN
  Administered 2020-07-26: 6000 mL

## 2020-07-26 MED ORDER — ONDANSETRON HCL 4 MG/2ML IJ SOLN
INTRAMUSCULAR | Status: AC
  Filled 2020-07-26: qty 2

## 2020-07-26 MED ORDER — PHENAZOPYRIDINE HCL 200 MG PO TABS: 200.0000 mg | ORAL_TABLET | Freq: Three times a day (TID) | ORAL | 0 refills | Status: DC | PRN

## 2020-07-26 MED ORDER — DEXAMETHASONE SODIUM PHOSPHATE 10 MG/ML IJ SOLN
INTRAMUSCULAR | Status: AC
  Filled 2020-07-26: qty 1

## 2020-07-26 MED ORDER — HYDRALAZINE HCL 20 MG/ML IJ SOLN
5.0000 mg | INTRAMUSCULAR | Status: AC | PRN
  Administered 2020-07-26: 5 mg via INTRAVENOUS

## 2020-07-26 MED ORDER — ORAL CARE MOUTH RINSE
15.0000 mL | Freq: Once | OROMUCOSAL | Status: AC
  Administered 2020-07-26: 15 mL via OROMUCOSAL

## 2020-07-26 MED ORDER — CHLORHEXIDINE GLUCONATE 0.12 % MT SOLN: 15.0000 mL | Freq: Once | OROMUCOSAL | Status: AC

## 2020-07-26 MED ORDER — FENTANYL CITRATE (PF) 100 MCG/2ML IJ SOLN: 25.0000 ug | INTRAMUSCULAR | Status: DC | PRN

## 2020-07-26 MED ORDER — CEFAZOLIN SODIUM-DEXTROSE 2-4 GM/100ML-% IV SOLN
INTRAVENOUS | Status: AC
  Filled 2020-07-26: qty 100

## 2020-07-26 MED ORDER — FENTANYL CITRATE (PF) 100 MCG/2ML IJ SOLN
INTRAMUSCULAR | Status: DC | PRN
  Administered 2020-07-26 (×5): 25 ug via INTRAVENOUS

## 2020-07-26 MED ORDER — DEXAMETHASONE SODIUM PHOSPHATE 10 MG/ML IJ SOLN
INTRAMUSCULAR | Status: DC | PRN
  Administered 2020-07-26: 4 mg via INTRAVENOUS

## 2020-07-26 MED ORDER — HYDRALAZINE HCL 20 MG/ML IJ SOLN
INTRAMUSCULAR | Status: AC
  Administered 2020-07-26: 5 mg via INTRAVENOUS
  Filled 2020-07-26: qty 1

## 2020-07-26 MED ORDER — LACTATED RINGERS IV SOLN: INTRAVENOUS | Status: DC

## 2020-07-26 MED ORDER — LIDOCAINE HCL 1 % IJ SOLN
INTRAMUSCULAR | Status: DC | PRN
  Administered 2020-07-26: 50 mg via INTRADERMAL

## 2020-07-26 MED ORDER — LIDOCAINE 2% (20 MG/ML) 5 ML SYRINGE
INTRAMUSCULAR | Status: AC
  Filled 2020-07-26: qty 5

## 2020-07-26 MED ORDER — CEFAZOLIN SODIUM-DEXTROSE 2-4 GM/100ML-% IV SOLN
2.0000 g | Freq: Once | INTRAVENOUS | Status: AC
  Administered 2020-07-26: 2 g via INTRAVENOUS

## 2020-07-26 SURGICAL SUPPLY — 15 items
BAG URO CATCHER STRL LF (MISCELLANEOUS) ×2 IMPLANT
CLOTH BEACON ORANGE TIMEOUT ST (SAFETY) ×2 IMPLANT
ELECT REM PT RETURN 9FT PED (ELECTROSURGICAL) ×2
ELECTRODE REM PT RETRN 9FT PED (ELECTROSURGICAL) ×1 IMPLANT
GLOVE SURG ENC TEXT LTX SZ7.5 (GLOVE) ×2 IMPLANT
GOWN STRL REUS W/TWL LRG LVL3 (GOWN DISPOSABLE) ×4 IMPLANT
KIT TURNOVER KIT A (KITS) ×2 IMPLANT
LASER FIB FLEXIVA PULSE ID 550 (Laser) ×2 IMPLANT
LASER FIB FLEXIVA PULSE ID 910 (Laser) IMPLANT
MANIFOLD NEPTUNE II (INSTRUMENTS) ×2 IMPLANT
PACK CYSTO (CUSTOM PROCEDURE TRAY) ×2 IMPLANT
SYR TOOMEY IRRIG 70ML (MISCELLANEOUS)
SYRINGE TOOMEY IRRIG 70ML (MISCELLANEOUS) IMPLANT
TUBING CONNECTING 10 (TUBING) ×2 IMPLANT
TUBING UROLOGY SET (TUBING) IMPLANT

## 2020-07-26 NOTE — Anesthesia Postprocedure Evaluation (Signed)
Anesthesia Post Note  Patient: Daniel Herrera  Procedure(s) Performed: CYSTOSCOPY WITH LITHOLAPAXY WITH FULGERATION (N/A ) HOLMIUM LASER APPLICATION (N/A )     Patient location during evaluation: PACU Anesthesia Type: General Level of consciousness: awake and alert Pain management: pain level controlled Vital Signs Assessment: post-procedure vital signs reviewed and stable Respiratory status: spontaneous breathing, nonlabored ventilation, respiratory function stable and patient connected to nasal cannula oxygen Cardiovascular status: blood pressure returned to baseline and stable Postop Assessment: no apparent nausea or vomiting Anesthetic complications: no   No complications documented.  Last Vitals:  Vitals:   07/26/20 0952 07/26/20 1012  BP: (!) 165/95   Pulse: 73   Resp: 18 20  Temp: (!) 36.3 C   SpO2: 100%     Last Pain:  Vitals:   07/26/20 1012  TempSrc:   PainSc: 0-No pain                 Effie Berkshire

## 2020-07-26 NOTE — OR Nursing (Signed)
Stone taken by Dr. Borden 

## 2020-07-26 NOTE — Op Note (Signed)
Preoperative diagnosis: 1.  Gross hematuria 2.  Bladder calculus (1.2 cm) 3.  History of prostate cancer status post radiation therapy  Postoperative diagnosis: 1.  Gross hematuria 2.  Bladder calculus (1.2 cm) 3.  History of prostate cancer status post radiation therapy  Procedures: 1.  Cystoscopy 2.  Clot evacuation 3.  Laser lithotripsy of 1.2 cm bladder calculus and removal 4.  Fulguration of bladder and prostate  Surgeon: Pryor Curia MD  Anesthesia: General  Complications: None  EBL: Minimal  Specimens: Bladder calculus  Disposition of specimen: [facility]  Indication: Daniel Herrera is an 80 year old gentleman with a history of prostate cancer status post radiation therapy.  He also has a known bladder calculus.  He has a history of intermittent gross hematuria which has become more persistent recently.  His previous evaluation revealed no concerning findings except for a possible urothelial lesion of the right ureter that was not particularly suspicious on independent review.  Repeat CT imaging a few weeks ago demonstrated no concern for upper tract urothelial abnormalities.  However, he was noted to have an enlarging bladder calculus and it was felt that this may be exacerbating radiation cystitis as the most likely cause for his persistent bleeding.  He has been passing multiple clots recently although has not developed recurrent clot retention.  The potential risks, complications, and expected recovery process associated with above procedures was discussed in detail.  He gave informed consent to proceed.  Description of procedure: The patient was taken the operating room and a general anesthetic was administered.  He was given preoperative antibiotics, placed in the dorsolithotomy position, prepped and draped in the usual sterile fashion.  Next, a preoperative timeout was performed.  Cystourethroscopy was then performed which revealed a normal  anterior urethra.  Inspection of the prostatic urethra did reveal friable areas with some evidence of mild bleeding actively.  Inspection of the bladder first revealed a large amount of clot that was evacuated with a Toomey syringe.  Further inspection revealed an area posteriorly that was significantly erythematous and irritated likely related to his bladder calculus from previous catheterizations.  However, no active bleeding was noted at this site.  No bladder tumors were identified.  Numerous small bladder diverticuli were noted as seen on his preoperative imaging.  However, no tumors or other urothelial lesions were noted within these diverticuli.  There was noted to be a 1.2 cm, smooth and rounded bladder calculus.  An attempt to fragment this with the lithotrite was unsuccessful due to the size and contour of the stone.  Instead, a 550 nm holmium laser fiber was used to perform laser lithotripsy on a setting of 1 J and 8 Hz.  This resulted in excellent fragmentation of the stone.  A Toomey syringe was then used to evacuate all stone fragments.  Reinspection of the bladder revealed that all sizable stone fragments were removed.  In addition, each of the previously noted diverticuli was examined to ensure that no large stone fragment was obviously left behind.  Reinspection of the bladder again revealed the erythematous area in the posterior bladder that appeared to be prone to bleeding although was not active bleeding.  A Bugbee electrode was used to perform fulguration of this area.  Withdrawing the cystoscope, there was noted to be active bleeding sites within the prostatic urethra which were carefully fulgurated until hemostasis appeared excellent.  The bladder was emptied and the cystoscope was carefully withdrawn.  The patient tolerated the procedure well  without complications.  He was able to be awakened and transferred to the recovery unit in satisfactory condition.

## 2020-07-26 NOTE — Interval H&P Note (Signed)
History and Physical Interval Note:  07/26/2020 6:59 AM  Daniel Herrera  has presented today for surgery, with the diagnosis of BLADDER CALCULUS.  The various methods of treatment have been discussed with the patient and family. After consideration of risks, benefits and other options for treatment, the patient has consented to  Procedure(s): CYSTOSCOPY WITH LITHOLAPAXY (N/A) as a surgical intervention.  The patient's history has been reviewed, patient examined, no change in status, stable for surgery.  I have reviewed the patient's chart and labs.  Questions were answered to the patient's satisfaction.     [employer]

## 2020-07-26 NOTE — Discharge Instructions (Signed)
1. You may see some blood in the urine and may have some burning with urination for 48-72 hours. You also may notice that you have to urinate more frequently or urgently after your procedure which is normal.  2. You should call should you develop an inability urinate, fever > 101, persistent nausea and vomiting that prevents you from eating or drinking to stay hydrated.  3. You may resume your aspirin 81 mg on 4/28 if your urine is clear at that time.  If not, please call the office to discuss when to resume.

## 2020-07-26 NOTE — Anesthesia Procedure Notes (Signed)
Procedure Name: LMA Insertion Date/Time: 07/26/2020 7:34 AM Performed by: Garrel Ridgel, CRNA Pre-anesthesia Checklist: Patient identified, Emergency Drugs available, Suction available, Patient being monitored and Timeout performed Patient Re-evaluated:Patient Re-evaluated prior to induction Oxygen Delivery Method: Circle system utilized Preoxygenation: Pre-oxygenation with 100% oxygen Induction Type: IV induction Ventilation: Mask ventilation without difficulty LMA: LMA inserted LMA Size: 4.0 Number of attempts: 1 Placement Confirmation: breath sounds checked- equal and bilateral and positive ETCO2 Tube secured with: Tape Dental Injury: Teeth and Oropharynx as per pre-operative assessment and Injury to lip  Comments: SRNA placed LMA successfully. Small lip laceration noted after placement confirmation obtained.

## 2020-07-26 NOTE — Transfer of Care (Signed)
Immediate Anesthesia Transfer of Care Note  Patient: Daniel Herrera  Procedure(s) Performed: CYSTOSCOPY WITH LITHOLAPAXY WITH FULGERATION (N/A ) HOLMIUM LASER APPLICATION (N/A )  Patient Location: PACU  Anesthesia Type:General  Level of Consciousness: awake, alert , oriented and patient cooperative  Airway & Oxygen Therapy: Patient Spontanous Breathing and Patient connected to face mask oxygen  Post-op Assessment: Report given to RN and Post -op Vital signs reviewed and stable  Post vital signs: Reviewed and stable  Last Vitals:  Vitals Value Taken Time  BP 184/89 07/26/20 0837  Temp    Pulse 64 07/26/20 0838  Resp 13 07/26/20 0838  SpO2 100 % 07/26/20 0838  Vitals shown include unvalidated device data.  Last Pain:  Vitals:   07/26/20 0537  TempSrc: Oral  PainSc: 0-No pain         Complications: No complications documented.

## 2020-07-27 ENCOUNTER — Other Ambulatory Visit: Payer: Self-pay

## 2020-07-27 ENCOUNTER — Emergency Department (HOSPITAL_COMMUNITY): Payer: Medicare Other

## 2020-07-27 ENCOUNTER — Encounter (HOSPITAL_COMMUNITY): Payer: Self-pay | Admitting: Urology

## 2020-07-27 ENCOUNTER — Emergency Department (HOSPITAL_COMMUNITY)
Admission: EM | Admit: 2020-07-27 | Discharge: 2020-07-27 | Disposition: A | Discharge status: Home / Self Care | Payer: Medicare Other | Attending: Emergency Medicine | Admitting: Emergency Medicine

## 2020-07-27 DIAGNOSIS — I2510 Atherosclerotic heart disease of native coronary artery without angina pectoris: Secondary | ICD-10-CM | POA: Insufficient documentation

## 2020-07-27 DIAGNOSIS — R079 Chest pain, unspecified: Secondary | ICD-10-CM | POA: Insufficient documentation

## 2020-07-27 DIAGNOSIS — J029 Acute pharyngitis, unspecified: Secondary | ICD-10-CM | POA: Insufficient documentation

## 2020-07-27 DIAGNOSIS — R531 Weakness: Secondary | ICD-10-CM | POA: Diagnosis not present

## 2020-07-27 DIAGNOSIS — Z79899 Other long term (current) drug therapy: Secondary | ICD-10-CM | POA: Insufficient documentation

## 2020-07-27 DIAGNOSIS — R070 Pain in throat: Secondary | ICD-10-CM

## 2020-07-27 DIAGNOSIS — I509 Heart failure, unspecified: Secondary | ICD-10-CM | POA: Diagnosis not present

## 2020-07-27 DIAGNOSIS — E860 Dehydration: Secondary | ICD-10-CM

## 2020-07-27 DIAGNOSIS — R42 Dizziness and giddiness: Secondary | ICD-10-CM | POA: Diagnosis not present

## 2020-07-27 DIAGNOSIS — N390 Urinary tract infection, site not specified: Secondary | ICD-10-CM

## 2020-07-27 DIAGNOSIS — Z8546 Personal history of malignant neoplasm of prostate: Secondary | ICD-10-CM | POA: Diagnosis not present

## 2020-07-27 DIAGNOSIS — Z85828 Personal history of other malignant neoplasm of skin: Secondary | ICD-10-CM | POA: Insufficient documentation

## 2020-07-27 DIAGNOSIS — R110 Nausea: Secondary | ICD-10-CM | POA: Diagnosis not present

## 2020-07-27 DIAGNOSIS — Z9861 Coronary angioplasty status: Secondary | ICD-10-CM | POA: Insufficient documentation

## 2020-07-27 DIAGNOSIS — E039 Hypothyroidism, unspecified: Secondary | ICD-10-CM | POA: Diagnosis not present

## 2020-07-27 DIAGNOSIS — Z4502 Encounter for adjustment and management of automatic implantable cardiac defibrillator: Secondary | ICD-10-CM | POA: Insufficient documentation

## 2020-07-27 DIAGNOSIS — I130 Hypertensive heart and chronic kidney disease with heart failure and stage 1 through stage 4 chronic kidney disease, or unspecified chronic kidney disease: Secondary | ICD-10-CM | POA: Insufficient documentation

## 2020-07-27 DIAGNOSIS — N183 Chronic kidney disease, stage 3 unspecified: Secondary | ICD-10-CM | POA: Insufficient documentation

## 2020-07-27 DIAGNOSIS — R0789 Other chest pain: Secondary | ICD-10-CM | POA: Diagnosis not present

## 2020-07-27 LAB — COMPREHENSIVE METABOLIC PANEL
ALT: 19 U/L (ref 0–44)
AST: 30 U/L (ref 15–41)
Albumin: 3.9 g/dL (ref 3.5–5.0)
Alkaline Phosphatase: 68 U/L (ref 38–126)
Anion gap: 11 (ref 5–15)
BUN: 24 mg/dL — ABNORMAL HIGH (ref 8–23)
CO2: 23 mmol/L (ref 22–32)
Calcium: 9.3 mg/dL (ref 8.9–10.3)
Chloride: 102 mmol/L (ref 98–111)
Creatinine, Ser: 1.56 mg/dL — ABNORMAL HIGH (ref 0.61–1.24)
GFR, Estimated: 44 mL/min — ABNORMAL LOW (ref >60)
Glucose, Bld: 164 mg/dL — ABNORMAL HIGH (ref 70–99)
Potassium: 4.4 mmol/L (ref 3.5–5.1)
Sodium: 136 mmol/L (ref 135–145)
Total Bilirubin: 1.5 mg/dL — ABNORMAL HIGH (ref 0.3–1.2)
Total Protein: 6.4 g/dL — ABNORMAL LOW (ref 6.5–8.1)

## 2020-07-27 LAB — URINALYSIS, ROUTINE W REFLEX MICROSCOPIC
Bilirubin Urine: NEGATIVE
Glucose, UA: NEGATIVE mg/dL
Ketones, ur: NEGATIVE mg/dL
Nitrite: POSITIVE — AB
Protein, ur: 100 mg/dL — AB
RBC / HPF: >50 RBC/hpf — ABNORMAL HIGH (ref 0–5)
Specific Gravity, Urine: 1.018 (ref 1.005–1.030)
WBC, UA: >50 WBC/hpf — ABNORMAL HIGH (ref 0–5)
pH: 5 (ref 5.0–8.0)

## 2020-07-27 LAB — TROPONIN I (HIGH SENSITIVITY)
Troponin I (High Sensitivity): 14 ng/L (ref <18)
Troponin I (High Sensitivity): 17 ng/L (ref <18)

## 2020-07-27 LAB — CBC
HCT: 46.7 % (ref 39.0–52.0)
Hemoglobin: 15.6 g/dL (ref 13.0–17.0)
MCH: 31.8 pg (ref 26.0–34.0)
MCHC: 33.4 g/dL (ref 30.0–36.0)
MCV: 95.1 fL (ref 80.0–100.0)
Platelets: 147 10*3/uL — ABNORMAL LOW (ref 150–400)
RBC: 4.91 MIL/uL (ref 4.22–5.81)
RDW: 12.8 % (ref 11.5–15.5)
WBC: 6.6 10*3/uL (ref 4.0–10.5)
nRBC: 0 % (ref 0.0–0.2)

## 2020-07-27 MED ORDER — HYDROMORPHONE HCL 1 MG/ML IJ SOLN
0.5000 mg | Freq: Once | INTRAMUSCULAR | Status: AC
  Administered 2020-07-27: 0.5 mg via INTRAVENOUS
  Filled 2020-07-27: qty 1

## 2020-07-27 MED ORDER — SODIUM CHLORIDE 0.9 % IV SOLN
1.0000 g | Freq: Once | INTRAVENOUS | Status: AC
  Administered 2020-07-27: 1 g via INTRAVENOUS
  Filled 2020-07-27: qty 10

## 2020-07-27 MED ORDER — ONDANSETRON HCL 4 MG/2ML IJ SOLN
4.0000 mg | Freq: Once | INTRAMUSCULAR | Status: AC
  Administered 2020-07-27: 4 mg via INTRAVENOUS
  Filled 2020-07-27: qty 2

## 2020-07-27 MED ORDER — SODIUM CHLORIDE 0.9 % IV BOLUS
1000.0000 mL | Freq: Once | INTRAVENOUS | Status: AC
  Administered 2020-07-27: 1000 mL via INTRAVENOUS

## 2020-07-27 MED ORDER — CEPHALEXIN 500 MG PO CAPS: 500.0000 mg | ORAL_CAPSULE | Freq: Four times a day (QID) | ORAL | 0 refills | Status: DC

## 2020-07-27 MED ORDER — LIDOCAINE VISCOUS HCL 2 % MT SOLN
15.0000 mL | Freq: Once | OROMUCOSAL | Status: AC
  Administered 2020-07-27: 15 mL via ORAL
  Filled 2020-07-27: qty 15

## 2020-07-27 MED ORDER — ALUM & MAG HYDROXIDE-SIMETH 200-200-20 MG/5ML PO SUSP
30.0000 mL | Freq: Once | ORAL | Status: AC
  Administered 2020-07-27: 30 mL via ORAL
  Filled 2020-07-27: qty 30

## 2020-07-27 NOTE — ED Notes (Signed)
Bladder scan resulted in 169 mL. Pt encouraged to void, pt stated he had to urge to void at this time.

## 2020-07-27 NOTE — Discharge Instructions (Addendum)
It was our pleasure to provide your ER care today - we hope that you feel better.  Your lab tests show a urine infection - stay well hydrated/drink plenty of fluids, take antibiotic as prescribed, and follow up with your doctor in the coming week.   Take ibuprofen or aleve as need. You may try throat lozenges for symptom relief.   Follow up with primary care doctor in the coming week. Also follow up closely with your urologist.   Also, given earlier chest discomfort, follow up with cardiologist in the next 1-2 weeks.   Return to ER if worse, new symptoms, fevers, severe abdominal pain, vomiting, unable to void, weak/fainting, trouble breathing, persistent or recurrent chest pain, or other concern.

## 2020-07-27 NOTE — ED Provider Notes (Signed)
[facility] EMERGENCY DEPARTMENT Provider Note   CSN: [id number] Arrival date & time: 07/27/20  0849     History Chief Complaint  Patient presents with  . Chest Pain    Daniel Herrera is a 80 y.o. male.  Patient c/o general weakness, episode of lightheadedness when stood up this AM, sore throat, and ?chest pain earlier this AM. Pt currently denies chest pain, and indicates he felt generally 'bad/weak' this AM, including in chest. No other recent chest pain or exertional chest pain. No pleuritic and/or constant chest pain. Pt reports had surgical procedure/cystoscopy yesterday for hematuria/bladder stone. States post procedure, had throat pain from ETT - constant, persistent, dull/burning, and as a result poor po intake. Is able to void, and feels able to empty bladder, +dysuria. No back/flank pain. No fever/chills. Did have two loose/diarrheal stools this AM, no bloody bms, no melena.   The history is provided by the patient and a relative.  Chest Pain Associated symptoms: nausea   Associated symptoms: no abdominal pain, no back pain, no cough, no fever, no headache, no palpitations, no shortness of breath and no vomiting        Past Medical History:  Diagnosis Date  . Anginal pain ([place]) 2016  . Angio-edema   . Anxiety   . Arthritis   . CAD (coronary artery disease)    a. 02/2015: DES to mid-LAD  . Cataract   . CHF (congestive heart failure) ([place]) 2020   after surgery only  . Dysrhythmia    A-fib  . Geographic tongue   . GERD (gastroesophageal reflux disease)   . Gout   . High cholesterol   . History of kidney stones   . History of prolonged Q-T interval on ECG   . Hx of Clostridium difficile infection   . Hx of umbilical hernia repair   . Hypertension   . Hypothyroidism   . Interstitial lung disease ([place])   . Myocardial infarction ([place]) 2016  . Peripheral vascular disease (HCC)    vericose veins  . Personal history of digestive disease     gastric ulcer  . Pre-diabetes   . Prostate cancer ([place])   . Prostate enlargement   . Skin cancer    basal cell carcinoma  . Thyroid disease   . Urticaria     Patient Active Problem List   Diagnosis Date Noted  . Urticaria   . Thyroid disease   . Skin cancer   . Prostate enlargement   . Prostate cancer ([place])   . Personal history of digestive disease   . Myocardial infarction ([place])   . Hypertension   . Hx of umbilical hernia repair   . Hx of Clostridium difficile infection   . History of prolonged Q-T interval on ECG   . High cholesterol   . Gout   . GERD (gastroesophageal reflux disease)   . Arthritis   . Anxiety   . Angio-edema   . Cataract   . Diverticular hemorrhage   . Lower GI bleed 06/03/2019  . Acute lower GI bleeding 05/28/2019  . CAD (coronary artery disease) 05/28/2019  . Diverticulosis   . Glossitis, benign migratory 12/04/2018  . Hoarseness 12/04/2018  . PAF (paroxysmal atrial fibrillation) ([place]) 06/13/2018  . S/P CABG x 3 05/28/2018  . NSTEMI (non-ST elevated myocardial infarction) ([place]) 05/21/2018  . Tachycardia determined by examination of pulse   . Pre-operative clearance 01/02/2018  . Hematochezia 12/21/2017  . CKD (chronic kidney disease) stage 3,  GFR 30-59 ml/min 12/21/2017  . GI bleed 12/21/2017  . Hereditary angioedema type 2 (HCC) 07/28/2017  . Enterotoxigenic Escherichia coli infection 05/24/2015  . STEC (Shiga toxin-producing Escherichia coli) infection 05/24/2015  . Colitis 05/23/2015  . History of prostate cancer   . Chronic diarrhea of unknown origin   . CAD S/P percutaneous coronary angioplasty   . Type 2 diabetes mellitus with unspecified complications (HCC)   . Palpitations 03/15/2015  . History of non-ST elevation myocardial infarction (NSTEMI) 02/22/2015  . Abnormal EKG 02/21/2015  . Essential hypertension 02/21/2015  . Dyslipidemia, goal LDL below 70 02/21/2015  . Malignant neoplasm of prostate (HCC) 08/07/2014  . ILD  (interstitial lung disease) (HCC) 12/17/2013    Past Surgical History:  Procedure Laterality Date  . APPENDECTOMY    . BACK SURGERY    . CARDIAC CATHETERIZATION N/A 02/21/2015   Procedure: Left Heart Cath;  Surgeon: Runell Gess, MD;  Location: [facility] INVASIVE CV LAB;  Service: Cardiovascular;  Laterality: N/A;  . CATARACT EXTRACTION, BILATERAL    . CORONARY ARTERY BYPASS GRAFT N/A 05/24/2018   Procedure: CORONARY ARTERY BYPASS GRAFTING (CABG), ON PUMP, TIMES THREE, USING BILATERAL INTERNAL MAMMARY ARTERY AND HARVESTED LEFT RADIAL ARTERY;  Surgeon: Alleen Borne, MD;  Location: [facility] OR;  Service: Open Heart Surgery;  Laterality: N/A;  . CYSTOSCOPY WITH LITHOLAPAXY N/A 07/26/2020   Procedure: CYSTOSCOPY WITH LITHOLAPAXY WITH FULGERATION;  Surgeon: Heloise Purpura, MD;  Location: [facility] ORS;  Service: Urology;  Laterality: N/A;  . HOLMIUM LASER APPLICATION N/A 07/26/2020   Procedure: HOLMIUM LASER APPLICATION;  Surgeon: Heloise Purpura, MD;  Location: [facility] ORS;  Service: Urology;  Laterality: N/A;  . LEFT HEART CATH AND CORONARY ANGIOGRAPHY N/A 05/22/2018   Procedure: LEFT HEART CATH AND CORONARY ANGIOGRAPHY;  Surgeon: Corky Crafts, MD;  Location[facility] INVASIVE CV LAB;  Service: Cardiovascular;  Laterality: N/A;  . NASAL SINUS SURGERY    . PROSTATE BIOPSY    . RADIAL ARTERY HARVEST Left 05/24/2018   Procedure: RADIAL ARTERY HARVEST;  Surgeon: Alleen Borne, MD;  Location: [facility] OR;  Service: Open Heart Surgery;  Laterality: Left;  . right shoulder rotator cuff repair    . TEE WITHOUT CARDIOVERSION N/A 05/24/2018   Procedure: TRANSESOPHAGEAL ECHOCARDIOGRAM (TEE);  Surgeon: Alleen Borne, MD;  Location: [facility] OR;  Service: Open Heart Surgery;  Laterality: N/A;       Family History  Problem Relation Age of Onset  . Cancer Mother   . Stomach cancer Mother   . Cancer Father        prostate  . Cancer Sister     Social History   Tobacco Use  . Smoking status: Never Smoker  . Smokeless tobacco:  Never Used  Vaping Use  . Vaping Use: Never used  Substance Use Topics  . Alcohol use: Never  . Drug use: No    Home Medications Prior to Admission medications   Medication Sig Start Date End Date Taking? Authorizing Provider  azelastine (ASTELIN) 0.1 % nasal spray USE 1 SPRAY IN EACH NOSTRIL TWICE DAILY Patient not taking: No sig reported 11/05/19   Kozlow, Alvira Philips, MD  Cyanocobalamin (VITAMIN B-12) 5000 MCG TBDP Take 5,000 mcg by mouth daily.     [provider]  dextromethorphan-guaiFENesin (MUCINEX DM) 30-600 MG 12hr tablet Take 1 tablet by mouth in the morning and at bedtime.    [provider]  docusate sodium (COLACE) 100 MG capsule Take 100 mg by mouth daily.  [provider]  EPINEPHrine 0.3 mg/0.3 mL IJ SOAJ injection Use as directed for life-threatening allergic reaction. Patient taking differently: Inject 0.3 mg into the muscle as needed for anaphylaxis. Use as directed for life-threatening allergic reaction. 06/16/20   Kozlow, Donnamarie Poag, MD  ezetimibe (ZETIA) 10 MG tablet Take 1 tablet (10 mg total) by mouth daily. 02/24/19 05/27/28  Lorretta Harp, MD  fluoruracil [facility]) 0.5 % cream Can apply to skin cancer for one to seven days as directed during flare-up. Patient taking differently: Apply 1 application topically daily as needed (skin cancer flares). 06/16/20   Kozlow, Donnamarie Poag, MD  fluticasone (FLONASE) 50 MCG/ACT nasal spray Use one spray in each nostril twice daily as directed. Patient taking differently: Place 1 spray into both nostrils daily. Use one spray in each nostril twice daily as directed. 12/15/19   Kozlow, Donnamarie Poag, MD  icatibant [facility]) 30 MG/3ML injection Inject 3 mLs (30 mg total) into the skin once as needed (for sudden attacks of hereditary angioedema). 10/22/19   Kozlow, Donnamarie Poag, MD  levothyroxine (SYNTHROID) 100 MCG tablet Take 100 mcg by mouth daily before breakfast.    [provider]  metoprolol succinate (TOPROL-XL) 25 MG 24  hr tablet Take 25 mg by mouth daily. 05/25/20   [provider]  metoprolol tartrate (LOPRESSOR) 25 MG tablet Take 0.5 tablets (12.5 mg total) by mouth 2 (two) times daily. Patient not taking: Reported on 07/09/2020 05/29/19   Shelly Coss, MD  montelukast (SINGULAIR) 10 MG tablet Take 1 tablet (10 mg total) by mouth at bedtime. Patient not taking: No sig reported 09/10/18   Kozlow, Donnamarie Poag, MD  nabumetone (RELAFEN) 750 MG tablet Take 750 mg by mouth 2 (two) times daily. 08/09/19   [provider]  nitroGLYCERIN (NITROSTAT) 0.4 MG SL tablet DISSOLVE 1 TABLET UNDER TONGUE EVERY 5 MINUTES FOR 3 DOSES AS NEEDED CHEST PAIN. IF NO RELIEF CALL 911 Patient taking differently: Place 0.4 mg under the tongue every 5 (five) minutes as needed for chest pain. 03/03/15   Consuelo Pandy, PA-C  NONFORMULARY OR COMPOUNDED ITEM Use as directed 5-15 mLs in the mouth or throat See admin instructions. Compounded mouth wash/gargle:1/1/1/1/1 (80 ml's of each) Purified water; Lidocaine Viscous; Nystatin suspension; Prednisolone 15/5; Maalox: Swish and spit using 5-15 ml's every 6 hours as needed for irritation    [provider]  nystatin (MYCOSTATIN) 100000 UNIT/ML suspension Take by mouth. Patient not taking: No sig reported 01/27/20   [provider]  OVER THE COUNTER MEDICATION Take 1 capsule by mouth in the morning and at bedtime. Health Plus/Super Colon Cleanse    [provider]  pantoprazole (PROTONIX) 40 MG tablet TAKE 1 TABLET(40 MG) BY MOUTH TWICE DAILY Patient taking differently: Take 40 mg by mouth 2 (two) times daily. 04/29/20   Kozlow, Donnamarie Poag, MD  phenazopyridine (PYRIDIUM) 200 MG tablet Take 1 tablet (200 mg total) by mouth 3 (three) times daily as needed (burning with urination). 07/26/20   Raynelle Bring, MD  Probiotic Product (PROBIOTIC DAILY PO) Take 1 capsule by mouth daily.    [provider]  TAKHZYRO 300 MG/2ML SOLN Inject 300 mg into the muscle  every 14 (fourteen) days. 11/24/19   Kozlow, Donnamarie Poag, MD    Allergies    Lisinopril, Tape, Benadryl [diphenhydramine hcl], Darvocet [propoxyphene n-acetaminophen], Darvon [propoxyphene hcl], and Tylenol [acetaminophen]  Review of Systems   Review of Systems  Constitutional: Negative for chills and fever.  HENT: Positive  for sore throat.   Eyes: Negative for redness.  Respiratory: Negative for cough and shortness of breath.   Cardiovascular: Positive for chest pain. Negative for palpitations and leg swelling.  Gastrointestinal: Positive for diarrhea and nausea. Negative for abdominal pain and vomiting.  Genitourinary: Positive for dysuria and hematuria. Negative for flank pain.  Musculoskeletal: Negative for back pain and neck pain.  Skin: Negative for rash.  Neurological: Positive for light-headedness. Negative for headaches.  Hematological: Does not bruise/bleed easily.  Psychiatric/Behavioral: Negative for confusion.    Physical Exam Updated Vital Signs BP 138/71   Pulse 91   Temp 98 F (36.7 C) (Oral)   Resp 20   Ht 1.854 m (6\' 1" )   Wt 90.7 kg   SpO2 99%   BMI 26.39 kg/m   Physical Exam Vitals and nursing note reviewed.  Constitutional:      Appearance: Normal appearance. He is well-developed.  HENT:     Head: Atraumatic.     Nose: Nose normal.     Mouth/Throat:     Mouth: Mucous membranes are moist.     Comments: Uvula and posterior pharynx/midline erythematous/excoriated. No abscess noted. Is able to swallow and handle secretions without difficulty. Eyes:     General: No scleral icterus.    Conjunctiva/sclera: Conjunctivae normal.  Neck:     Trachea: No tracheal deviation.  Cardiovascular:     Rate and Rhythm: Normal rate and regular rhythm.     Pulses: Normal pulses.     Heart sounds: Normal heart sounds. No murmur heard. No friction rub. No gallop.   Pulmonary:     Effort: Pulmonary effort is normal. No accessory muscle usage or respiratory distress.      Breath sounds: Normal breath sounds. No stridor.  Abdominal:     General: Bowel sounds are normal. There is no distension.     Palpations: Abdomen is soft. There is no mass.     Tenderness: There is no abdominal tenderness. There is no guarding.  Genitourinary:    Comments: No cva tenderness. Normal external gu exam.  Musculoskeletal:        General: No swelling or tenderness.     Cervical back: Normal range of motion and neck supple. No rigidity.     Right lower leg: No edema.     Left lower leg: No edema.  Lymphadenopathy:     Cervical: No cervical adenopathy.  Skin:    General: Skin is warm and dry.     Findings: No rash.  Neurological:     Mental Status: He is alert.     Comments: Alert, speech clear. Motor/sens grossly intact bil.   Psychiatric:        Mood and Affect: Mood normal.     ED Results / Procedures / Treatments   Labs (all labs ordered are listed, but only abnormal results are displayed) Results for orders placed or performed during the hospital encounter of 07/27/20  Comprehensive metabolic panel  Result Value Ref Range   Sodium 136 135 - 145 mmol/L   Potassium 4.4 3.5 - 5.1 mmol/L   Chloride 102 98 - 111 mmol/L   CO2 23 22 - 32 mmol/L   Glucose, Bld 164 (H) 70 - 99 mg/dL   BUN 24 (H) 8 - 23 mg/dL   Creatinine, Ser 1.56 (H) 0.61 - 1.24 mg/dL   Calcium 9.3 8.9 - 10.3 mg/dL   Total Protein 6.4 (L) 6.5 - 8.1 g/dL   Albumin 3.9 3.5 - 5.0  g/dL   AST 30 15 - 41 U/L   ALT 19 0 - 44 U/L   Alkaline Phosphatase 68 38 - 126 U/L   Total Bilirubin 1.5 (H) 0.3 - 1.2 mg/dL   GFR, Estimated 44 (L) >60 mL/min   Anion gap 11 5 - 15  CBC  Result Value Ref Range   WBC 6.6 4.0 - 10.5 K/uL   RBC 4.91 4.22 - 5.81 MIL/uL   Hemoglobin 15.6 13.0 - 17.0 g/dL   HCT 46.7 39.0 - 52.0 %   MCV 95.1 80.0 - 100.0 fL   MCH 31.8 26.0 - 34.0 pg   MCHC 33.4 30.0 - 36.0 g/dL   RDW 12.8 11.5 - 15.5 %   Platelets 147 (L) 150 - 400 K/uL   nRBC 0.0 0.0 - 0.2 %  Urinalysis, Routine w  reflex microscopic Urine, Clean Catch  Result Value Ref Range   Color, Urine AMBER (A) YELLOW   APPearance HAZY (A) CLEAR   Specific Gravity, Urine 1.018 1.005 - 1.030   pH 5.0 5.0 - 8.0   Glucose, UA NEGATIVE NEGATIVE mg/dL   Hgb urine dipstick LARGE (A) NEGATIVE   Bilirubin Urine NEGATIVE NEGATIVE   Ketones, ur NEGATIVE NEGATIVE mg/dL   Protein, ur 100 (A) NEGATIVE mg/dL   Nitrite POSITIVE (A) NEGATIVE   Leukocytes,Ua SMALL (A) NEGATIVE   RBC / HPF >50 (H) 0 - 5 RBC/hpf   WBC, UA >50 (H) 0 - 5 WBC/hpf   Bacteria, UA FEW (A) NONE SEEN   Mucus PRESENT   Troponin I (High Sensitivity)  Result Value Ref Range   Troponin I (High Sensitivity) 14 <18 ng/L  Troponin I (High Sensitivity)  Result Value Ref Range   Troponin I (High Sensitivity) 17 <18 ng/L    EKG EKG Interpretation  Date/Time:  Tuesday July 27 2020 08:59:25 EDT Ventricular Rate:  97 PR Interval:  60 QRS Duration: 100 QT Interval:  349 QTC Calculation: 444 R Axis:   -27 Text Interpretation: Sinus rhythm Non-specific ST-t changes Baseline wander Confirmed by Lajean Saver [id number]) on 07/27/2020 9:14:26 AM   Radiology DG Chest Port 1 View  Result Date: 07/27/2020 CLINICAL DATA:  Weakness. EXAM: PORTABLE CHEST 1 VIEW COMPARISON:  Chest radiograph September 11, 2018. Chest CT May 19, 2019. FINDINGS: No substantial change in peripheral and basilar predominant interstitial thickening. No new consolidation. Biapical pleuroparenchymal scarring. No visible pleural effusions or pneumothorax on this single AP semi erect radiograph. Remote distal left clavicular fracture with nonunion. Similar cardiomediastinal silhouette with CABG and median sternotomy. IMPRESSION: No substantial change in peripheral and basilar predominant interstitial thickening, compatible with interstitial lung disease that was better characterized on prior CT chest. No new consolidation. Electronically Signed   By: Margaretha Sheffield MD   On: 07/27/2020 10:40     Procedures Procedures   Medications Ordered in ED Medications  sodium chloride 0.9 % bolus 1,000 mL (has no administration in time range)  ondansetron (ZOFRAN) injection 4 mg (has no administration in time range)  HYDROmorphone (DILAUDID) injection 0.5 mg (has no administration in time range)  alum & mag hydroxide-simeth (MAALOX/MYLANTA) 200-200-20 MG/5ML suspension 30 mL (has no administration in time range)    And  lidocaine (XYLOCAINE) 2 % viscous mouth solution 15 mL (has no administration in time range)    ED Course  I have reviewed the triage vital signs and the nursing notes.  Pertinent labs & imaging results that were available during my care of the patient were  reviewed by me and considered in my medical decision making (see chart for details).    MDM Rules/Calculators/A&P                         Iv ns bolus. zofran iv. Dilaudid .5 mg iv. Labs sent. Ecg. Continuous pulse ox and cardiac monitoring.   Reviewed nursing notes and prior charts for additional history.   Labs reviewed/interpreted by me - wbc/hgb normal. Initial trop normal.   CXR reviewed/interpreted by me - no pna.   Po fluids/food.   Bladder scan only 169 ml. Abd soft nt.   Additional labs reviewed/interpreted by me - delta trop normal. ua c/w uti, urine culture sent. Iv abx given.   Po fluids/food - tolerating well.   Recheck abd soft nt. Recheck pt, no chest pain or sob. Reports feeling improved post fluids.   Pt currently appears stable for d/c. Pt indicates he feels ready to go home.   Return precautions provided.        Final Clinical Impression(s) / ED Diagnoses Final diagnoses:  None    Rx / DC Orders ED Discharge Orders    None       Lajean Saver, MD 07/27/20 [phone]

## 2020-07-27 NOTE — ED Triage Notes (Signed)
Patient coming from home, complaint of chest discomfort this morning. Had procedure for kidney stones yesterday.

## 2020-07-27 NOTE — ED Notes (Signed)
Did ekg shown to Dr Ashok Cordia patient is resting with family at bedside and call bell in reach

## 2020-07-29 LAB — URINE CULTURE: Culture: NO GROWTH

## 2020-07-30 ENCOUNTER — Other Ambulatory Visit: Payer: Self-pay

## 2020-07-30 DIAGNOSIS — K141 Geographic tongue: Secondary | ICD-10-CM | POA: Insufficient documentation

## 2020-07-30 DIAGNOSIS — I499 Cardiac arrhythmia, unspecified: Secondary | ICD-10-CM | POA: Insufficient documentation

## 2020-07-30 DIAGNOSIS — Z87442 Personal history of urinary calculi: Secondary | ICD-10-CM | POA: Insufficient documentation

## 2020-07-30 DIAGNOSIS — J849 Interstitial pulmonary disease, unspecified: Secondary | ICD-10-CM | POA: Insufficient documentation

## 2020-07-30 DIAGNOSIS — I739 Peripheral vascular disease, unspecified: Secondary | ICD-10-CM | POA: Insufficient documentation

## 2020-07-30 DIAGNOSIS — R7303 Prediabetes: Secondary | ICD-10-CM | POA: Insufficient documentation

## 2020-07-30 DIAGNOSIS — E039 Hypothyroidism, unspecified: Secondary | ICD-10-CM | POA: Insufficient documentation

## 2020-08-02 ENCOUNTER — Ambulatory Visit (INDEPENDENT_AMBULATORY_CARE_PROVIDER_SITE_OTHER): Payer: Medicare Other | Admitting: Cardiology

## 2020-08-02 ENCOUNTER — Encounter: Payer: Self-pay | Admitting: Cardiology

## 2020-08-02 ENCOUNTER — Other Ambulatory Visit: Payer: Self-pay

## 2020-08-02 VITALS — BP 138/78 | HR 72 | Ht 73.0 in | Wt 198.0 lb

## 2020-08-02 DIAGNOSIS — I2510 Atherosclerotic heart disease of native coronary artery without angina pectoris: Secondary | ICD-10-CM | POA: Diagnosis not present

## 2020-08-02 DIAGNOSIS — Z951 Presence of aortocoronary bypass graft: Secondary | ICD-10-CM | POA: Diagnosis not present

## 2020-08-02 DIAGNOSIS — E118 Type 2 diabetes mellitus with unspecified complications: Secondary | ICD-10-CM

## 2020-08-02 DIAGNOSIS — I10 Essential (primary) hypertension: Secondary | ICD-10-CM

## 2020-08-02 DIAGNOSIS — A499 Bacterial infection, unspecified: Secondary | ICD-10-CM | POA: Diagnosis not present

## 2020-08-02 DIAGNOSIS — K1379 Other lesions of oral mucosa: Secondary | ICD-10-CM | POA: Diagnosis not present

## 2020-08-02 DIAGNOSIS — N390 Urinary tract infection, site not specified: Secondary | ICD-10-CM | POA: Diagnosis not present

## 2020-08-02 DIAGNOSIS — B370 Candidal stomatitis: Secondary | ICD-10-CM | POA: Diagnosis not present

## 2020-08-02 DIAGNOSIS — E860 Dehydration: Secondary | ICD-10-CM | POA: Diagnosis not present

## 2020-08-02 NOTE — Patient Instructions (Signed)
Medication Instructions:  Your physician recommends that you continue on your current medications as directed. Please refer to the Current Medication list given to you today.  *If you need a refill on your cardiac medications before your next appointment, please call your pharmacy*   Lab Work: Your physician recommends that you return for lab work in: [place] NEXT VISIT   BMP, LIPIDS, LFT If you have labs (blood work) drawn today and your tests are completely normal, you will receive your results only by: [name] MyChart Message (if you have MyChart) OR . A paper copy in the mail If you have any lab test that is abnormal or we need to change your treatment, we will call you to review the results.   Testing/Procedures: Your physician has requested that you have an echocardiogram. Echocardiography is a painless test that uses sound waves to create images of your heart. It provides your doctor with information about the size and shape of your heart and how well your heart's chambers and valves are working. This procedure takes approximately one hour. There are no restrictions for this procedure.     Follow-Up: At [facility], you and your health needs are our priority.  As part of our continuing mission to provide you with exceptional heart care, we have created designated Provider Care Teams.  These Care Teams include your primary Cardiologist (physician) and Advanced Practice Providers (APPs -  Physician Assistants and Nurse Practitioners) who all work together to provide you with the care you need, when you need it.  We recommend signing up for the patient portal called "MyChart".  Sign up information is provided on this After Visit Summary.  MyChart is used to connect with patients for Virtual Visits (Telemedicine).  Patients are able to view lab/test results, encounter notes, upcoming appointments, etc.  Non-urgent messages can be sent to your provider as well.   To learn more about what  you can do with MyChart, go to [email or web address].    Your next appointment:   3 month(s)  The format for your next appointment:   In Person  Provider:   Jyl Heinz, MD   Other Instructions

## 2020-08-02 NOTE — Progress Notes (Signed)
Cardiology Office Note:    Date:  08/02/2020   ID:  Daniel Herrera, DOB 1940/04/05, MRN [id number]  PCP:  [name], NP  Cardiologist:  Jenean Lindau, MD   Referring MD: [name], NP    ASSESSMENT:    1. Coronary artery disease involving native coronary artery of native heart without angina pectoris   2. Essential hypertension   3. Type 2 diabetes mellitus with unspecified complications ([place])   4. S/P CABG x 3    PLAN:    In order of problems listed above:  1. Coronary artery disease: Secondary prevention stressed with patient.  Importance of maintenance with diet medication stressed any vocalized understanding.  I reviewed emergency room records.  I told the nitroglycerin is not the appropriate medications for palpitations.  Fortunately he has not had any such issues or recurrence of symptoms and is happy about it. 2. I advised him to walk on a regular basis and he promises to do so. 3. Essential hypertension: Blood pressure stable and diet was emphasized. 4. Mixed dyslipidemia: Diet emphasized.  Lipids will be checked in the next visit milligrams of.  Weight reduction was also stressed.  Importance of regular ambulation stressed. 5. Again I reassured him about emergency room visit.  Echocardiogram will be done to assess murmur heard on auscultation.   Medication Adjustments/Labs and Tests Ordered: Current medicines are reviewed at length with the patient today.  Concerns regarding medicines are outlined above.  No orders of the defined types were placed in this encounter.  No orders of the defined types were placed in this encounter.    No chief complaint on file.    History of Present Illness:    Daniel Herrera is a 80 y.o. male.  Patient has past medical history of renal disease, essential hypertension and dyslipidemia.  He mentions to me that he underwent lithotripsy and subsequently did not feel well.  He regularly seen palpitations when he came in because  of the palpitations. Nitroglycerin sublingual and subsequently running more dizzy and went to the hospital.  He was treated evaluated and released.  Subsequently is done fine.  He is keeping himself well-hydrated and has had no symptoms of palpitations chest pain or any such symptoms.  At the time of my evaluation is alert awake oriented and in no distress.  Past Medical History:  Diagnosis Date  . Abnormal EKG 02/21/2015  . Acute lower GI bleeding 05/28/2019  . Anginal pain ([place]) 2016  . Angio-edema   . Anxiety   . Arthritis   . CAD (coronary artery disease)    a. 02/2015: DES to mid-LAD  . CAD S/P percutaneous coronary angioplasty    NSTEMI treated with LAD PCI with DES x 03 Feb 2015 Myoview low risk Nov 2017  . Cataract   . CHF (congestive heart failure) ([place]) 2020   after surgery only  . Chronic diarrhea of unknown origin   . CKD (chronic kidney disease) stage 3, GFR 30-59 ml/min 12/21/2017  . Colitis 05/23/2015  . Diverticular hemorrhage   . Diverticulosis   . Dyslipidemia, goal LDL below 70 02/21/2015   hyperlipidemia   . Dysrhythmia    A-fib  . Enterotoxigenic Escherichia coli infection 05/24/2015  . Essential hypertension 02/21/2015  . Geographic tongue   . GERD (gastroesophageal reflux disease)   . GI bleed 12/21/2017  . Glossitis, benign migratory 12/04/2018  . Gout   . Hematochezia 12/21/2017   Admitted Sept 2019 with lower  GI bleeding felt to be secondary to hemorrhoids  . Hereditary angioedema type 2 ([place]) 07/28/2017  . High cholesterol   . History of kidney stones   . History of non-ST elevation myocardial infarction (NSTEMI) 02/22/2015   Nov 2016  . History of prolonged Q-T interval on ECG   . History of prostate cancer   . Hoarseness 12/04/2018  . Hx of Clostridium difficile infection   . Hx of umbilical hernia repair   . Hypertension   . Hypothyroidism   . ILD (interstitial lung disease) ([place]) 12/17/2013   ?related to chicken farm exposure or aspiration  pneumonitis from GERD Previously attributed to acute interstitial pneumonitis but based on HRCT this is likely IPF, "probable UIP"  . Interstitial lung disease ([place])   . Lower GI bleed 06/03/2019  . Malignant neoplasm of prostate ([place]) 08/07/2014  . Myocardial infarction ([place]) 2016  . NSTEMI (non-ST elevated myocardial infarction) ([place]) 05/21/2018  . PAF (paroxysmal atrial fibrillation) ([place]) 06/13/2018   Post CABG- discharged on Amiodarone, he will probably not need this long term  . Palpitations 03/15/2015   palpitations   . Peripheral vascular disease ([facility])    vericose veins  . Personal history of digestive disease    gastric ulcer  . Pre-diabetes   . Pre-operative clearance 01/02/2018  . Prostate cancer ([place])   . Prostate enlargement   . S/P CABG x 3 05/28/2018    LIMA-LAD, RIMA-RCA, and Lt radial to OM.  . Skin cancer    basal cell carcinoma  . STEC (Shiga toxin-producing Escherichia coli) infection 05/24/2015  . Tachycardia determined by examination of pulse   . Thyroid disease   . Type 2 diabetes mellitus with unspecified complications ([place])   . Urticaria     Past Surgical History:  Procedure Laterality Date  . APPENDECTOMY    . BACK SURGERY    . CARDIAC CATHETERIZATION N/A 02/21/2015   Procedure: Left Heart Cath;  Surgeon: Lorretta Harp, MD;  Location: [facility];  Service: Cardiovascular;  Laterality: N/A;  . CATARACT EXTRACTION, BILATERAL    . CORONARY ARTERY BYPASS GRAFT N/A 05/24/2018   Procedure: CORONARY ARTERY BYPASS GRAFTING (CABG), ON PUMP, TIMES THREE, USING BILATERAL INTERNAL MAMMARY ARTERY AND HARVESTED LEFT RADIAL ARTERY;  Surgeon: Gaye Pollack, MD;  Location: [facility];  Service: Open Heart Surgery;  Laterality: N/A;  . CYSTOSCOPY WITH LITHOLAPAXY N/A 07/26/2020   Procedure: CYSTOSCOPY WITH LITHOLAPAXY WITH FULGERATION;  Surgeon: Raynelle Bring, MD;  Location: [facility];  Service: Urology;  Laterality: N/A;  . HOLMIUM LASER APPLICATION N/A 2/70/3500    Procedure: HOLMIUM LASER APPLICATION;  Surgeon: Raynelle Bring, MD;  Location: [facility];  Service: Urology;  Laterality: N/A;  . LEFT HEART CATH AND CORONARY ANGIOGRAPHY N/A 05/22/2018   Procedure: LEFT HEART CATH AND CORONARY ANGIOGRAPHY;  Surgeon: Jettie Booze, MD;  Location: [facility];  Service: Cardiovascular;  Laterality: N/A;  . NASAL SINUS SURGERY    . PROSTATE BIOPSY    . RADIAL ARTERY HARVEST Left 05/24/2018   Procedure: RADIAL ARTERY HARVEST;  Surgeon: Gaye Pollack, MD;  Location: [facility];  Service: Open Heart Surgery;  Laterality: Left;  . right shoulder rotator cuff repair    . TEE WITHOUT CARDIOVERSION N/A 05/24/2018   Procedure: TRANSESOPHAGEAL ECHOCARDIOGRAM (TEE);  Surgeon: Gaye Pollack, MD;  Location: [facility];  Service: Open Heart Surgery;  Laterality: N/A;    Current Medications: Current Meds  Medication Sig  . aspirin EC 81  MG tablet Take 81 mg by mouth daily. Swallow whole.  [name] azelastine (ASTELIN) 0.1 % nasal spray Place 1 spray into both nostrils 2 (two) times daily. Use in each nostril as directed  . cephALEXin (KEFLEX) 500 MG capsule Take 1 capsule (500 mg total) by mouth 4 (four) times daily.  . Cyanocobalamin (VITAMIN B-12) 5000 MCG TBDP Take 5,000 mcg by mouth daily.   [name] dextromethorphan-guaiFENesin (MUCINEX DM) 30-600 MG 12hr tablet Take 1 tablet by mouth in the morning and at bedtime.  [name] EPINEPHrine 0.3 mg/0.3 mL IJ SOAJ injection Inject 0.3 mg into the muscle as needed for anaphylaxis.  [name] ezetimibe (ZETIA) 10 MG tablet Take 1 tablet (10 mg total) by mouth daily.  . fluoruracil (CARAC) 0.5 % cream Apply 1 application topically as needed for rash. For skin cancer flare up  . fluticasone (FLONASE) 50 MCG/ACT nasal spray Place 1 spray into both nostrils 2 (two) times daily.  [name] icatibant (FIRAZYR) 30 MG/3ML injection Inject 3 mLs (30 mg total) into the skin once as needed (for sudden attacks of hereditary angioedema).  [name] levothyroxine (SYNTHROID) 100 MCG  tablet Take 100 mcg by mouth daily before breakfast.  . metoprolol tartrate (LOPRESSOR) 25 MG tablet Take 0.5 tablets (12.5 mg total) by mouth 2 (two) times daily.  . montelukast (SINGULAIR) 10 MG tablet Take 1 tablet (10 mg total) by mouth at bedtime.  . nabumetone (RELAFEN) 750 MG tablet Take 750 mg by mouth 2 (two) times daily.  . nitroGLYCERIN (NITROSTAT) 0.4 MG SL tablet Place 0.4 mg under the tongue every 5 (five) minutes as needed for chest pain.  . NONFORMULARY OR COMPOUNDED ITEM Use as directed 5-15 mLs in the mouth or throat See admin instructions. Compounded mouth wash/gargle:1/1/1/1/1 (80 ml's of each) Purified water; Lidocaine Viscous; Nystatin suspension; Prednisolone 15/5; Maalox: Swish and spit using 5-15 ml's every 6 hours as needed for irritation  . OVER THE COUNTER MEDICATION Take 1 capsule by mouth in the morning and at bedtime. Health Plus/Super Colon Cleanse  . pantoprazole (PROTONIX) 40 MG tablet Take 40 mg by mouth 2 (two) times daily.  . phenazopyridine (PYRIDIUM) 200 MG tablet Take 1 tablet (200 mg total) by mouth 3 (three) times daily as needed (burning with urination).  . Pitavastatin Calcium (LIVALO) 4 MG TABS Take by mouth at bedtime.  . Probiotic Product (PROBIOTIC DAILY PO) Take 1 capsule by mouth daily.  . Tadalafil 2.5 MG TABS Take 1 tablet by mouth daily.  [name] TAKHZYRO 300 MG/2ML SOLN Inject 300 mg into the muscle every 14 (fourteen) days.     Allergies:   Lisinopril, Tape, Benadryl [diphenhydramine hcl], Darvocet [propoxyphene n-acetaminophen], Darvon [propoxyphene hcl], and Tylenol [acetaminophen]   Social History   Socioeconomic History  . Marital status: Married    Spouse name: Not on file  . Number of children: Not on file  . Years of education: Not on file  . Highest education level: Not on file  Occupational History  . Occupation: retired  Tobacco Use  . Smoking status: Never Smoker  . Smokeless tobacco: Never Used  Vaping Use  . Vaping Use:  Never used  Substance and Sexual Activity  . Alcohol use: Never  . Drug use: No  . Sexual activity: Not on file  Other Topics Concern  . Not on file  Social History Narrative  . Not on file   Social Determinants of Health   Financial Resource Strain: Not on file  Food Insecurity: Not on file  Transportation Needs: Not on file  Physical Activity: Not on file  Stress: Not on file  Social Connections: Not on file     Family History: The patient's family history includes Cancer in his father, mother, and sister; Stomach cancer in his mother.  ROS:   Please see the history of present illness.    All other systems reviewed and are negative.  EKGs/Labs/Other Studies Reviewed:    The following studies were reviewed today: I discussed my findings with the patient.  I reviewed lab work and notes from emergency room visit.  Patient's questions were answered to satisfaction   Recent Labs: 07/27/2020: ALT 19; BUN 24; Creatinine, Ser 1.56; Hemoglobin 15.6; Platelets 147; Potassium 4.4; Sodium 136  Recent Lipid Panel    Component Value Date/Time   CHOL 183 05/23/2019 0826   TRIG 179 (H) 05/23/2019 0826   HDL 44 05/23/2019 0826   CHOLHDL 4.2 05/23/2019 0826   CHOLHDL 3.7 05/24/2015 0725   VLDL 38 05/24/2015 0725   LDLCALC 108 (H) 05/23/2019 0826    Physical Exam:    VS:  BP 138/78   Pulse 72   Ht 6\' 1"  (1.854 m)   Wt 198 lb (89.8 kg)   SpO2 97%   BMI 26.12 kg/m     Wt Readings from Last 3 Encounters:  08/02/20 198 lb (89.8 kg)  07/27/20 200 lb (90.7 kg)  07/20/20 200 lb (90.7 kg)     GEN: Patient is in no acute distress HEENT: Normal NECK: No JVD; No carotid bruits LYMPHATICS: No lymphadenopathy CARDIAC: Hear sounds regular, 2/6 systolic murmur at the apex. RESPIRATORY:  Clear to auscultation without rales, wheezing or rhonchi  ABDOMEN: Soft, non-tender, non-distended MUSCULOSKELETAL:  No edema; No deformity  SKIN: Warm and dry NEUROLOGIC:  Alert and oriented x  3 PSYCHIATRIC:  Normal affect   Signed, Jenean Lindau, MD  08/02/2020 8:06 AM    [place]

## 2020-08-13 DIAGNOSIS — I2510 Atherosclerotic heart disease of native coronary artery without angina pectoris: Secondary | ICD-10-CM | POA: Diagnosis not present

## 2020-08-13 DIAGNOSIS — E039 Hypothyroidism, unspecified: Secondary | ICD-10-CM | POA: Diagnosis not present

## 2020-08-13 DIAGNOSIS — C61 Malignant neoplasm of prostate: Secondary | ICD-10-CM | POA: Diagnosis not present

## 2020-08-13 DIAGNOSIS — E1169 Type 2 diabetes mellitus with other specified complication: Secondary | ICD-10-CM | POA: Diagnosis not present

## 2020-08-13 DIAGNOSIS — D841 Defects in the complement system: Secondary | ICD-10-CM | POA: Diagnosis not present

## 2020-08-13 DIAGNOSIS — E538 Deficiency of other specified B group vitamins: Secondary | ICD-10-CM | POA: Diagnosis not present

## 2020-08-13 DIAGNOSIS — D649 Anemia, unspecified: Secondary | ICD-10-CM | POA: Diagnosis not present

## 2020-08-13 DIAGNOSIS — E559 Vitamin D deficiency, unspecified: Secondary | ICD-10-CM | POA: Diagnosis not present

## 2020-08-13 DIAGNOSIS — E785 Hyperlipidemia, unspecified: Secondary | ICD-10-CM | POA: Diagnosis not present

## 2020-08-13 DIAGNOSIS — J849 Interstitial pulmonary disease, unspecified: Secondary | ICD-10-CM | POA: Diagnosis not present

## 2020-08-13 DIAGNOSIS — Z6827 Body mass index (BMI) 27.0-27.9, adult: Secondary | ICD-10-CM | POA: Diagnosis not present

## 2020-08-13 DIAGNOSIS — Z23 Encounter for immunization: Secondary | ICD-10-CM | POA: Diagnosis not present

## 2020-08-13 DIAGNOSIS — I10 Essential (primary) hypertension: Secondary | ICD-10-CM | POA: Diagnosis not present

## 2020-08-13 DIAGNOSIS — N1831 Chronic kidney disease, stage 3a: Secondary | ICD-10-CM | POA: Diagnosis not present

## 2020-08-27 ENCOUNTER — Other Ambulatory Visit: Payer: Self-pay

## 2020-08-27 ENCOUNTER — Ambulatory Visit (INDEPENDENT_AMBULATORY_CARE_PROVIDER_SITE_OTHER): Payer: Medicare Other

## 2020-08-27 DIAGNOSIS — I10 Essential (primary) hypertension: Secondary | ICD-10-CM | POA: Diagnosis not present

## 2020-08-27 DIAGNOSIS — E118 Type 2 diabetes mellitus with unspecified complications: Secondary | ICD-10-CM

## 2020-08-27 DIAGNOSIS — Z951 Presence of aortocoronary bypass graft: Secondary | ICD-10-CM

## 2020-08-27 DIAGNOSIS — I2510 Atherosclerotic heart disease of native coronary artery without angina pectoris: Secondary | ICD-10-CM | POA: Diagnosis not present

## 2020-08-27 LAB — ECHOCARDIOGRAM COMPLETE
Area-P 1/2: 3.17 cm2
S' Lateral: 3.5 cm

## 2020-08-27 NOTE — Progress Notes (Addendum)
Complete echocardiogram w/strain performed.  Jimmy Collins Dimaria RDCS, RVT

## 2020-09-02 ENCOUNTER — Inpatient Hospital Stay (HOSPITAL_COMMUNITY)
Admission: EM | Admit: 2020-09-02 | Discharge: 2020-09-04 | DRG: 504 | Disposition: A | Discharge status: Home Health | Payer: Medicare Other | Attending: Orthopedic Surgery | Admitting: Orthopedic Surgery

## 2020-09-02 ENCOUNTER — Emergency Department (HOSPITAL_COMMUNITY): Payer: Medicare Other

## 2020-09-02 ENCOUNTER — Telehealth: Payer: Self-pay | Admitting: Cardiology

## 2020-09-02 ENCOUNTER — Emergency Department (HOSPITAL_COMMUNITY): Payer: Medicare Other | Admitting: Anesthesiology

## 2020-09-02 ENCOUNTER — Encounter (HOSPITAL_COMMUNITY): Payer: Self-pay | Admitting: Emergency Medicine

## 2020-09-02 ENCOUNTER — Other Ambulatory Visit: Payer: Self-pay

## 2020-09-02 ENCOUNTER — Encounter (HOSPITAL_COMMUNITY)
Admission: EM | Disposition: A | Discharge status: Home Health | Payer: Self-pay | Source: Home / Self Care | Attending: Orthopedic Surgery

## 2020-09-02 DIAGNOSIS — E7800 Pure hypercholesterolemia, unspecified: Secondary | ICD-10-CM | POA: Diagnosis present

## 2020-09-02 DIAGNOSIS — E785 Hyperlipidemia, unspecified: Secondary | ICD-10-CM | POA: Diagnosis not present

## 2020-09-02 DIAGNOSIS — Z7982 Long term (current) use of aspirin: Secondary | ICD-10-CM

## 2020-09-02 DIAGNOSIS — E039 Hypothyroidism, unspecified: Secondary | ICD-10-CM | POA: Diagnosis not present

## 2020-09-02 DIAGNOSIS — Z87442 Personal history of urinary calculi: Secondary | ICD-10-CM

## 2020-09-02 DIAGNOSIS — I130 Hypertensive heart and chronic kidney disease with heart failure and stage 1 through stage 4 chronic kidney disease, or unspecified chronic kidney disease: Secondary | ICD-10-CM | POA: Diagnosis not present

## 2020-09-02 DIAGNOSIS — I252 Old myocardial infarction: Secondary | ICD-10-CM

## 2020-09-02 DIAGNOSIS — E1122 Type 2 diabetes mellitus with diabetic chronic kidney disease: Secondary | ICD-10-CM | POA: Diagnosis present

## 2020-09-02 DIAGNOSIS — Z888 Allergy status to other drugs, medicaments and biological substances status: Secondary | ICD-10-CM

## 2020-09-02 DIAGNOSIS — S92331B Displaced fracture of third metatarsal bone, right foot, initial encounter for open fracture: Secondary | ICD-10-CM | POA: Diagnosis not present

## 2020-09-02 DIAGNOSIS — I509 Heart failure, unspecified: Secondary | ICD-10-CM | POA: Diagnosis present

## 2020-09-02 DIAGNOSIS — N183 Chronic kidney disease, stage 3 unspecified: Secondary | ICD-10-CM | POA: Diagnosis not present

## 2020-09-02 DIAGNOSIS — Z9861 Coronary angioplasty status: Secondary | ICD-10-CM

## 2020-09-02 DIAGNOSIS — Z951 Presence of aortocoronary bypass graft: Secondary | ICD-10-CM

## 2020-09-02 DIAGNOSIS — Z800 Family history of malignant neoplasm of digestive organs: Secondary | ICD-10-CM

## 2020-09-02 DIAGNOSIS — Z20822 Contact with and (suspected) exposure to covid-19: Secondary | ICD-10-CM | POA: Diagnosis present

## 2020-09-02 DIAGNOSIS — Z8546 Personal history of malignant neoplasm of prostate: Secondary | ICD-10-CM

## 2020-09-02 DIAGNOSIS — M109 Gout, unspecified: Secondary | ICD-10-CM | POA: Diagnosis present

## 2020-09-02 DIAGNOSIS — Z79899 Other long term (current) drug therapy: Secondary | ICD-10-CM

## 2020-09-02 DIAGNOSIS — E1151 Type 2 diabetes mellitus with diabetic peripheral angiopathy without gangrene: Secondary | ICD-10-CM | POA: Diagnosis present

## 2020-09-02 DIAGNOSIS — Z79890 Hormone replacement therapy (postmenopausal): Secondary | ICD-10-CM

## 2020-09-02 DIAGNOSIS — S92309B Fracture of unspecified metatarsal bone(s), unspecified foot, initial encounter for open fracture: Secondary | ICD-10-CM

## 2020-09-02 DIAGNOSIS — Z885 Allergy status to narcotic agent status: Secondary | ICD-10-CM

## 2020-09-02 DIAGNOSIS — F419 Anxiety disorder, unspecified: Secondary | ICD-10-CM | POA: Diagnosis not present

## 2020-09-02 DIAGNOSIS — D841 Defects in the complement system: Secondary | ICD-10-CM | POA: Diagnosis not present

## 2020-09-02 DIAGNOSIS — Z91048 Other nonmedicinal substance allergy status: Secondary | ICD-10-CM

## 2020-09-02 DIAGNOSIS — S92331A Displaced fracture of third metatarsal bone, right foot, initial encounter for closed fracture: Secondary | ICD-10-CM | POA: Diagnosis not present

## 2020-09-02 DIAGNOSIS — S9781XA Crushing injury of right foot, initial encounter: Secondary | ICD-10-CM | POA: Diagnosis present

## 2020-09-02 DIAGNOSIS — S92321B Displaced fracture of second metatarsal bone, right foot, initial encounter for open fracture: Secondary | ICD-10-CM | POA: Diagnosis not present

## 2020-09-02 DIAGNOSIS — S91311A Laceration without foreign body, right foot, initial encounter: Secondary | ICD-10-CM | POA: Diagnosis present

## 2020-09-02 DIAGNOSIS — S92321A Displaced fracture of second metatarsal bone, right foot, initial encounter for closed fracture: Secondary | ICD-10-CM | POA: Diagnosis not present

## 2020-09-02 DIAGNOSIS — S92901A Unspecified fracture of right foot, initial encounter for closed fracture: Secondary | ICD-10-CM | POA: Diagnosis not present

## 2020-09-02 DIAGNOSIS — Z9049 Acquired absence of other specified parts of digestive tract: Secondary | ICD-10-CM

## 2020-09-02 DIAGNOSIS — K219 Gastro-esophageal reflux disease without esophagitis: Secondary | ICD-10-CM | POA: Diagnosis present

## 2020-09-02 DIAGNOSIS — S92301B Fracture of unspecified metatarsal bone(s), right foot, initial encounter for open fracture: Secondary | ICD-10-CM

## 2020-09-02 DIAGNOSIS — I480 Paroxysmal atrial fibrillation: Secondary | ICD-10-CM | POA: Diagnosis not present

## 2020-09-02 DIAGNOSIS — M7989 Other specified soft tissue disorders: Secondary | ICD-10-CM | POA: Diagnosis not present

## 2020-09-02 DIAGNOSIS — Z85828 Personal history of other malignant neoplasm of skin: Secondary | ICD-10-CM

## 2020-09-02 DIAGNOSIS — I2510 Atherosclerotic heart disease of native coronary artery without angina pectoris: Secondary | ICD-10-CM | POA: Diagnosis present

## 2020-09-02 HISTORY — DX: Fracture of unspecified metatarsal bone(s), unspecified foot, initial encounter for open fracture: S92.309B

## 2020-09-02 HISTORY — PX: ORIF TOE FRACTURE: SHX5032

## 2020-09-02 HISTORY — PX: I & D EXTREMITY: SHX5045

## 2020-09-02 LAB — POCT I-STAT, CHEM 8
BUN: 27 mg/dL — ABNORMAL HIGH (ref 8–23)
Calcium, Ion: 1.25 mmol/L (ref 1.15–1.40)
Chloride: 107 mmol/L (ref 98–111)
Creatinine, Ser: 1.4 mg/dL — ABNORMAL HIGH (ref 0.61–1.24)
Glucose, Bld: 109 mg/dL — ABNORMAL HIGH (ref 70–99)
HCT: 41 % (ref 39.0–52.0)
Hemoglobin: 13.9 g/dL (ref 13.0–17.0)
Potassium: 3.8 mmol/L (ref 3.5–5.1)
Sodium: 142 mmol/L (ref 135–145)
TCO2: 23 mmol/L (ref 22–32)

## 2020-09-02 LAB — CBC
HCT: 40.6 % (ref 39.0–52.0)
Hemoglobin: 13.7 g/dL (ref 13.0–17.0)
MCH: 32.2 pg (ref 26.0–34.0)
MCHC: 33.7 g/dL (ref 30.0–36.0)
MCV: 95.5 fL (ref 80.0–100.0)
Platelets: 161 10*3/uL (ref 150–400)
RBC: 4.25 MIL/uL (ref 4.22–5.81)
RDW: 12.9 % (ref 11.5–15.5)
WBC: 6 10*3/uL (ref 4.0–10.5)
nRBC: 0 % (ref 0.0–0.2)

## 2020-09-02 LAB — GLUCOSE, CAPILLARY
Glucose-Capillary: 105 mg/dL — ABNORMAL HIGH (ref 70–99)
Glucose-Capillary: 104 mg/dL — ABNORMAL HIGH (ref 70–99)

## 2020-09-02 LAB — SURGICAL PCR SCREEN
MRSA, PCR: NEGATIVE
Staphylococcus aureus: NEGATIVE

## 2020-09-02 LAB — RESP PANEL BY RT-PCR (FLU A&B, COVID) ARPGX2
Influenza A by PCR: NEGATIVE
Influenza B by PCR: NEGATIVE
SARS Coronavirus 2 by RT PCR: NEGATIVE

## 2020-09-02 LAB — BRAIN NATRIURETIC PEPTIDE: B Natriuretic Peptide: 150.2 pg/mL — ABNORMAL HIGH (ref 0.0–100.0)

## 2020-09-02 SURGERY — IRRIGATION AND DEBRIDEMENT EXTREMITY
Anesthesia: General | Site: Toe | Laterality: Right

## 2020-09-02 MED ORDER — VANCOMYCIN HCL 1000 MG IV SOLR
INTRAVENOUS | Status: AC
  Filled 2020-09-02: qty 1000

## 2020-09-02 MED ORDER — MAGNESIUM CITRATE PO SOLN: 1.0000 | Freq: Once | ORAL | Status: DC | PRN

## 2020-09-02 MED ORDER — MONTELUKAST SODIUM 10 MG PO TABS
10.0000 mg | ORAL_TABLET | Freq: Every day | ORAL | Status: DC
  Administered 2020-09-02 – 2020-09-03 (×2): 10 mg via ORAL
  Filled 2020-09-02 (×2): qty 1

## 2020-09-02 MED ORDER — POVIDONE-IODINE 10 % EX SWAB
2.0000 | Freq: Once | CUTANEOUS | Status: AC
Start: 2020-09-02 — End: 2020-09-02
  Administered 2020-09-02: 2 via TOPICAL

## 2020-09-02 MED ORDER — VITAMIN B-12 1000 MCG PO TABS
5000.0000 ug | ORAL_TABLET | Freq: Every day | ORAL | Status: DC
  Administered 2020-09-03 – 2020-09-04 (×2): 5000 ug via ORAL
  Filled 2020-09-02 (×2): qty 5

## 2020-09-02 MED ORDER — FENTANYL CITRATE (PF) 100 MCG/2ML IJ SOLN
25.0000 ug | INTRAMUSCULAR | Status: DC | PRN
  Administered 2020-09-02 (×3): 50 ug via INTRAVENOUS

## 2020-09-02 MED ORDER — FLUTICASONE PROPIONATE 50 MCG/ACT NA SUSP
1.0000 | Freq: Two times a day (BID) | NASAL | Status: DC
  Administered 2020-09-03 – 2020-09-04 (×4): 1 via NASAL
  Filled 2020-09-02: qty 16

## 2020-09-02 MED ORDER — CEFAZOLIN SODIUM-DEXTROSE 2-4 GM/100ML-% IV SOLN
2.0000 g | INTRAVENOUS | Status: AC
  Administered 2020-09-02: 2 g via INTRAVENOUS

## 2020-09-02 MED ORDER — CEFAZOLIN SODIUM-DEXTROSE 2-4 GM/100ML-% IV SOLN
2.0000 g | Freq: Three times a day (TID) | INTRAVENOUS | Status: AC
  Administered 2020-09-03 (×3): 2 g via INTRAVENOUS
  Filled 2020-09-02 (×3): qty 100

## 2020-09-02 MED ORDER — ONDANSETRON HCL 4 MG/2ML IJ SOLN
INTRAMUSCULAR | Status: DC | PRN
  Administered 2020-09-02: 4 mg via INTRAVENOUS

## 2020-09-02 MED ORDER — NITROGLYCERIN 0.4 MG SL SUBL: 0.4000 mg | SUBLINGUAL_TABLET | SUBLINGUAL | Status: DC | PRN

## 2020-09-02 MED ORDER — FENTANYL CITRATE (PF) 100 MCG/2ML IJ SOLN
INTRAMUSCULAR | Status: AC
  Filled 2020-09-02: qty 2

## 2020-09-02 MED ORDER — ICATIBANT ACETATE 30 MG/3ML ~~LOC~~ SOLN: 30.0000 mg | Freq: Once | SUBCUTANEOUS | Status: DC | PRN

## 2020-09-02 MED ORDER — EZETIMIBE 10 MG PO TABS
10.0000 mg | ORAL_TABLET | Freq: Every day | ORAL | Status: DC
  Administered 2020-09-03 – 2020-09-04 (×2): 10 mg via ORAL
  Filled 2020-09-02 (×2): qty 1

## 2020-09-02 MED ORDER — 0.9 % SODIUM CHLORIDE (POUR BTL) OPTIME
TOPICAL | Status: DC | PRN
  Administered 2020-09-02: 1000 mL

## 2020-09-02 MED ORDER — FENTANYL CITRATE (PF) 250 MCG/5ML IJ SOLN
INTRAMUSCULAR | Status: DC | PRN
  Administered 2020-09-02: 100 ug via INTRAVENOUS

## 2020-09-02 MED ORDER — AZELASTINE HCL 0.1 % NA SOLN
1.0000 | Freq: Two times a day (BID) | NASAL | Status: DC
  Administered 2020-09-03 – 2020-09-04 (×2): 1 via NASAL
  Filled 2020-09-02: qty 30

## 2020-09-02 MED ORDER — INSULIN ASPART 100 UNIT/ML IJ SOLN
0.0000 [IU] | Freq: Three times a day (TID) | INTRAMUSCULAR | Status: DC
  Administered 2020-09-03: 3 [IU] via SUBCUTANEOUS

## 2020-09-02 MED ORDER — IBUPROFEN 200 MG PO TABS
800.0000 mg | ORAL_TABLET | Freq: Four times a day (QID) | ORAL | Status: DC | PRN
  Filled 2020-09-02: qty 4

## 2020-09-02 MED ORDER — SODIUM CHLORIDE 0.9 % IV SOLN: INTRAVENOUS | Status: DC

## 2020-09-02 MED ORDER — TADALAFIL 5 MG PO TABS
2.5000 mg | ORAL_TABLET | Freq: Every day | ORAL | Status: DC
  Filled 2020-09-02 (×2): qty 1

## 2020-09-02 MED ORDER — LACTATED RINGERS IV SOLN: INTRAVENOUS | Status: DC

## 2020-09-02 MED ORDER — METOCLOPRAMIDE HCL 5 MG PO TABS
5.0000 mg | ORAL_TABLET | Freq: Three times a day (TID) | ORAL | Status: DC | PRN
Start: 2020-09-02 — End: 2020-09-04

## 2020-09-02 MED ORDER — ORAL CARE MOUTH RINSE: 15.0000 mL | Freq: Once | OROMUCOSAL | Status: AC

## 2020-09-02 MED ORDER — PROPOFOL 10 MG/ML IV BOLUS
INTRAVENOUS | Status: DC | PRN
  Administered 2020-09-02: 130 mg via INTRAVENOUS

## 2020-09-02 MED ORDER — METOCLOPRAMIDE HCL 5 MG/ML IJ SOLN
5.0000 mg | Freq: Three times a day (TID) | INTRAMUSCULAR | Status: DC | PRN
Start: 2020-09-02 — End: 2020-09-04

## 2020-09-02 MED ORDER — FENTANYL CITRATE (PF) 250 MCG/5ML IJ SOLN
INTRAMUSCULAR | Status: AC
  Filled 2020-09-02: qty 5

## 2020-09-02 MED ORDER — ONDANSETRON HCL 4 MG/2ML IJ SOLN: 4.0000 mg | Freq: Once | INTRAMUSCULAR | Status: DC | PRN

## 2020-09-02 MED ORDER — CEFAZOLIN SODIUM-DEXTROSE 2-4 GM/100ML-% IV SOLN
2.0000 g | INTRAVENOUS | Status: DC
  Filled 2020-09-02 (×2): qty 100

## 2020-09-02 MED ORDER — OXYCODONE HCL 5 MG PO TABS: 5.0000 mg | ORAL_TABLET | ORAL | Status: DC | PRN

## 2020-09-02 MED ORDER — DM-GUAIFENESIN ER 30-600 MG PO TB12
1.0000 | ORAL_TABLET | Freq: Two times a day (BID) | ORAL | Status: DC
  Administered 2020-09-02 – 2020-09-04 (×4): 1 via ORAL
  Filled 2020-09-02 (×4): qty 1

## 2020-09-02 MED ORDER — PHENAZOPYRIDINE HCL 200 MG PO TABS
200.0000 mg | ORAL_TABLET | Freq: Three times a day (TID) | ORAL | Status: DC | PRN
  Filled 2020-09-02: qty 1

## 2020-09-02 MED ORDER — CHLORHEXIDINE GLUCONATE 4 % EX LIQD
60.0000 mL | Freq: Once | CUTANEOUS | Status: DC
  Filled 2020-09-02: qty 60

## 2020-09-02 MED ORDER — POLYETHYLENE GLYCOL 3350 17 G PO PACK: 17.0000 g | PACK | Freq: Every day | ORAL | Status: DC | PRN

## 2020-09-02 MED ORDER — BACITRACIN ZINC 500 UNIT/GM EX OINT
TOPICAL_OINTMENT | CUTANEOUS | Status: AC
  Filled 2020-09-02: qty 28.35

## 2020-09-02 MED ORDER — HYDROMORPHONE HCL 1 MG/ML IJ SOLN: 0.5000 mg | INTRAMUSCULAR | Status: DC | PRN

## 2020-09-02 MED ORDER — PRAVASTATIN SODIUM 40 MG PO TABS
80.0000 mg | ORAL_TABLET | Freq: Every day | ORAL | Status: DC
  Administered 2020-09-03: 80 mg via ORAL
  Filled 2020-09-02: qty 2

## 2020-09-02 MED ORDER — ONDANSETRON HCL 4 MG/2ML IJ SOLN: 4.0000 mg | Freq: Four times a day (QID) | INTRAMUSCULAR | Status: DC | PRN

## 2020-09-02 MED ORDER — CHLORHEXIDINE GLUCONATE 0.12 % MT SOLN: 15.0000 mL | Freq: Once | OROMUCOSAL | Status: AC

## 2020-09-02 MED ORDER — NABUMETONE 500 MG PO TABS
750.0000 mg | ORAL_TABLET | Freq: Two times a day (BID) | ORAL | Status: DC
  Administered 2020-09-03 – 2020-09-04 (×3): 750 mg via ORAL
  Filled 2020-09-02 (×6): qty 2

## 2020-09-02 MED ORDER — BISACODYL 10 MG RE SUPP: 10.0000 mg | Freq: Every day | RECTAL | Status: DC | PRN

## 2020-09-02 MED ORDER — IBUPROFEN 200 MG PO TABS
600.0000 mg | ORAL_TABLET | Freq: Four times a day (QID) | ORAL | Status: DC | PRN
  Administered 2020-09-03 – 2020-09-04 (×2): 600 mg via ORAL
  Filled 2020-09-02 (×2): qty 3

## 2020-09-02 MED ORDER — OXYCODONE HCL 5 MG PO TABS
10.0000 mg | ORAL_TABLET | ORAL | Status: DC | PRN
  Filled 2020-09-02: qty 2

## 2020-09-02 MED ORDER — SENNA 8.6 MG PO TABS
1.0000 | ORAL_TABLET | Freq: Two times a day (BID) | ORAL | Status: DC
  Administered 2020-09-03 – 2020-09-04 (×2): 8.6 mg via ORAL
  Filled 2020-09-02 (×3): qty 1

## 2020-09-02 MED ORDER — VANCOMYCIN HCL 1000 MG IV SOLR
INTRAVENOUS | Status: DC | PRN
  Administered 2020-09-02: 1000 mg via TOPICAL

## 2020-09-02 MED ORDER — ASPIRIN EC 81 MG PO TBEC
81.0000 mg | DELAYED_RELEASE_TABLET | Freq: Every day | ORAL | Status: DC
  Administered 2020-09-03 – 2020-09-04 (×2): 81 mg via ORAL
  Filled 2020-09-02 (×2): qty 1

## 2020-09-02 MED ORDER — DOCUSATE SODIUM 100 MG PO CAPS
100.0000 mg | ORAL_CAPSULE | Freq: Two times a day (BID) | ORAL | Status: DC
  Administered 2020-09-03 – 2020-09-04 (×3): 100 mg via ORAL
  Filled 2020-09-02 (×3): qty 1

## 2020-09-02 MED ORDER — SODIUM CHLORIDE 0.9 % IR SOLN
Status: DC | PRN
  Administered 2020-09-02: 3000 mL

## 2020-09-02 MED ORDER — PANTOPRAZOLE SODIUM 40 MG PO TBEC
40.0000 mg | DELAYED_RELEASE_TABLET | Freq: Two times a day (BID) | ORAL | Status: DC
  Administered 2020-09-02 – 2020-09-04 (×4): 40 mg via ORAL
  Filled 2020-09-02 (×2): qty 1
  Filled 2020-09-02: qty 2
  Filled 2020-09-02: qty 1

## 2020-09-02 MED ORDER — METOPROLOL TARTRATE 12.5 MG HALF TABLET
12.5000 mg | ORAL_TABLET | Freq: Two times a day (BID) | ORAL | Status: DC
  Administered 2020-09-03 – 2020-09-04 (×3): 12.5 mg via ORAL
  Filled 2020-09-02 (×3): qty 1

## 2020-09-02 MED ORDER — LEVOTHYROXINE SODIUM 100 MCG PO TABS
100.0000 ug | ORAL_TABLET | Freq: Every day | ORAL | Status: DC
  Administered 2020-09-03 – 2020-09-04 (×2): 100 ug via ORAL
  Filled 2020-09-02 (×2): qty 1

## 2020-09-02 MED ORDER — LIDOCAINE 2% (20 MG/ML) 5 ML SYRINGE
INTRAMUSCULAR | Status: DC | PRN
  Administered 2020-09-02: 80 mg via INTRAVENOUS

## 2020-09-02 MED ORDER — ONDANSETRON HCL 4 MG PO TABS: 4.0000 mg | ORAL_TABLET | Freq: Four times a day (QID) | ORAL | Status: DC | PRN

## 2020-09-02 MED ORDER — CHLORHEXIDINE GLUCONATE 0.12 % MT SOLN
OROMUCOSAL | Status: AC
  Administered 2020-09-02: 15 mL via OROMUCOSAL
  Filled 2020-09-02: qty 15

## 2020-09-02 SURGICAL SUPPLY — 58 items
BANDAGE ESMARK 6X9 LF (GAUZE/BANDAGES/DRESSINGS) ×2 IMPLANT
BLADE SURG 10 STRL SS (BLADE) ×4 IMPLANT
BNDG COHESIVE 4X5 TAN STRL (GAUZE/BANDAGES/DRESSINGS) ×4 IMPLANT
BNDG COHESIVE 6X5 TAN STRL LF (GAUZE/BANDAGES/DRESSINGS) ×4 IMPLANT
BNDG CONFORM 3 STRL LF (GAUZE/BANDAGES/DRESSINGS) ×4 IMPLANT
BNDG ESMARK 6X9 LF (GAUZE/BANDAGES/DRESSINGS) ×4
CANISTER SUCT 3000ML PPV (MISCELLANEOUS) ×4 IMPLANT
COVER SURGICAL LIGHT HANDLE (MISCELLANEOUS) ×8 IMPLANT
CUFF TOURN SGL QUICK 34 (TOURNIQUET CUFF) ×2
CUFF TRNQT CYL 34X4.125X (TOURNIQUET CUFF) ×2 IMPLANT
DRAPE C-ARM 42X72 X-RAY (DRAPES) ×4 IMPLANT
DRAPE U-SHAPE 47X51 STRL (DRAPES) ×4 IMPLANT
DRSG ADAPTIC 3X8 NADH LF (GAUZE/BANDAGES/DRESSINGS) IMPLANT
DRSG MEPITEL 4X7.2 (GAUZE/BANDAGES/DRESSINGS) ×4 IMPLANT
DRSG PAD ABDOMINAL 8X10 ST (GAUZE/BANDAGES/DRESSINGS) ×8 IMPLANT
ELECT REM PT RETURN 9FT ADLT (ELECTROSURGICAL) ×4
ELECTRODE REM PT RTRN 9FT ADLT (ELECTROSURGICAL) ×2 IMPLANT
GAUZE SPONGE 4X4 12PLY STRL (GAUZE/BANDAGES/DRESSINGS) ×4 IMPLANT
GLOVE BIO SURGEON STRL SZ7 (GLOVE) IMPLANT
GLOVE BIO SURGEON STRL SZ8 (GLOVE) ×4 IMPLANT
GLOVE BIOGEL PI IND STRL 7.5 (GLOVE) ×2 IMPLANT
GLOVE BIOGEL PI INDICATOR 7.5 (GLOVE) ×2
GLOVE ECLIPSE 8.0 STRL XLNG CF (GLOVE) ×4 IMPLANT
GLOVE SRG 8 PF TXTR STRL LF DI (GLOVE) ×4 IMPLANT
GLOVE SURG UNDER POLY LF SZ8 (GLOVE) ×4
GOWN STRL REUS W/ TWL LRG LVL3 (GOWN DISPOSABLE) ×2 IMPLANT
GOWN STRL REUS W/ TWL XL LVL3 (GOWN DISPOSABLE) ×2 IMPLANT
GOWN STRL REUS W/TWL LRG LVL3 (GOWN DISPOSABLE) ×2
GOWN STRL REUS W/TWL XL LVL3 (GOWN DISPOSABLE) ×2
K-WIRE SURGICAL 1.6X102 (WIRE) ×8 IMPLANT
KIT BASIN OR (CUSTOM PROCEDURE TRAY) ×4 IMPLANT
KIT TURNOVER KIT B (KITS) ×4 IMPLANT
NS IRRIG 1000ML POUR BTL (IV SOLUTION) ×4 IMPLANT
PACK ORTHO EXTREMITY (CUSTOM PROCEDURE TRAY) ×4 IMPLANT
PAD ARMBOARD 7.5X6 YLW CONV (MISCELLANEOUS) ×8 IMPLANT
PAD CAST 4YDX4 CTTN HI CHSV (CAST SUPPLIES) ×2 IMPLANT
PADDING CAST COTTON 4X4 STRL (CAST SUPPLIES) ×2
SET CYSTO W/LG BORE CLAMP LF (SET/KITS/TRAYS/PACK) ×4 IMPLANT
SOL PREP POV-IOD 4OZ 10% (MISCELLANEOUS) ×4 IMPLANT
SOL PREP PROV IODINE SCRUB 4OZ (MISCELLANEOUS) ×4 IMPLANT
SPLINT PLASTER CAST XFAST 5X30 (CAST SUPPLIES) ×2 IMPLANT
SPLINT PLASTER XFAST SET 5X30 (CAST SUPPLIES) ×2
SPONGE LAP 4X18 RFD (DISPOSABLE) IMPLANT
SUCTION FRAZIER HANDLE 10FR (MISCELLANEOUS) ×2
SUCTION TUBE FRAZIER 10FR DISP (MISCELLANEOUS) ×2 IMPLANT
SUT ETHILON 2 0 FS 18 (SUTURE) IMPLANT
SUT ETHILON 3 0 PS 1 (SUTURE) ×4 IMPLANT
SUT PROLENE 3 0 PS 2 (SUTURE) IMPLANT
SUT VIC AB 2-0 CT1 27 (SUTURE)
SUT VIC AB 2-0 CT1 TAPERPNT 27 (SUTURE) IMPLANT
SUT VIC AB 3-0 PS2 18 (SUTURE)
SUT VIC AB 3-0 PS2 18XBRD (SUTURE) IMPLANT
TOWEL GREEN STERILE (TOWEL DISPOSABLE) ×4 IMPLANT
TOWEL GREEN STERILE FF (TOWEL DISPOSABLE) ×4 IMPLANT
TUBE CONNECTING 12'X1/4 (SUCTIONS) ×1
TUBE CONNECTING 12X1/4 (SUCTIONS) ×3 IMPLANT
WATER STERILE IRR 1000ML POUR (IV SOLUTION) ×4 IMPLANT
YANKAUER SUCT BULB TIP NO VENT (SUCTIONS) ×4 IMPLANT

## 2020-09-02 NOTE — ED Triage Notes (Signed)
Patient sent to [facility] from [facility] in [place] for evaluation of an open fracture of right foot after hay bale fell onto his foot. No other injury, patient reports 5/10 pain and is able to move toes distal to injury. Patient alert, oriented, and in no apparent distress at this time.

## 2020-09-02 NOTE — ED Provider Notes (Signed)
Emergency Medicine Provider Triage Evaluation Note  Daniel Herrera , a 80 y.o. male  was evaluated in triage.  Pt complains of right foot laceration possible open fracture.  Was seen and evaluated at urgent care just prior to arrival and was sent to the ER for this open fracture.  Denies any significant severe pain but states that they put a pressure dressing on the foot.  Denies any numbness or other injuries states that some type of farm equipment fell on his foot.  Review of Systems  Positive: Laceration, right foot pain Negative: Numbness, weakness  Physical Exam  BP (!) 144/99   Pulse 73   Temp 98.6 F (37 C)   Resp 18   SpO2 100%  Gen:   Awake, no distress Resp:  Normal effort MSK:   Moves extremities without difficulty Other:  Dressing noted to right foot  Medical Decision Making  Medically screening exam initiated at 2:57 PM.  Appropriate orders placed.  Daniel Herrera was informed that the remainder of the evaluation will be completed by another provider, this initial triage assessment does not replace that evaluation, and the importance of remaining in the ED until their evaluation is complete.  Will need x-rays we cannot review the x-rays taken at urgent care just prior to arrival.  Will need room soon   Delia Heady, PA-C 09/02/20 1458    Pattricia Boss, MD 09/06/20 1407

## 2020-09-02 NOTE — Telephone Encounter (Signed)
July appt was cancelled as he was seen sooner. Pt verbalized understanding and had no additional questions.

## 2020-09-02 NOTE — Op Note (Addendum)
09/02/2020  6:52 PM  PATIENT:  Daniel Herrera  80 y.o. male  PRE-OPERATIVE DIAGNOSIS: 1.  Right foot crush injury      2.  Right foot angular laceration 7 cm long      3.  Open fractures of right 2nd and 3rd metatarsal shafts  POST-OPERATIVE DIAGNOSIS:  Same  Procedure(s): 1.  Irrigation and excisional debridement of right open second and third metatarsal fractures including skin, subcutaneous tissue, muscle and bone 2.  Open treatment of right second and third metatarsal fractures with internal fixation 3.  Complex closure of right dorsal foot laceration 7 cm long 4.  AP, lateral and oblique radiographs of the right foot  SURGEON:  Wylene Simmer, MD  ASSISTANT: None  ANESTHESIA:   General  EBL:  minimal   TOURNIQUET:   Total Tourniquet Time Documented: Thigh (Right) - 36 minutes Total: Thigh (Right) - 36 minutes  COMPLICATIONS:  None apparent  DISPOSITION:  Extubated, awake and stable to recovery.  Debridement type: Excisional Debridement  Side: right  Body Location: foot   Tools used for debridement: scalpel, scissors, curette and rongeur  Pre-debridement Wound size (cm):   Length: 7cm        Width: 2 cm     Depth: 1 cm   Post-debridement Wound size (cm):   Length: 9 cm        Width: 2 cm     Depth: 1 cm   Debridement depth beyond dead/damaged tissue down to healthy viable tissue: yes  Tissue layer involved: skin, subcutaneous tissue, muscle / fascia, bone  Nature of tissue removed: Devitalized Tissue  Irrigation volume: 3 L     Irrigation fluid type: Normal Saline   INDICATION FOR PROCEDURE: The patient is an 80 year old male with a past medical history significant for diabetes and coronary artery disease.  He sustained a crush injury to his right foot earlier this afternoon when a trailer tongue fell on the dorsum of his foot.  He sustained open fractures of his second and third metatarsals as well as a complex laceration of the dorsum of his right foot.   He presents now for operative treatment of these open fractures.The risks and benefits of the alternative treatment options have been discussed in detail.  The patient wishes to proceed with surgery and specifically understands risks of bleeding, infection, nerve damage, blood clots, need for additional surgery, amputation and death.  PROCEDURE IN DETAIL: After preoperative consent was obtained the correct operative site was identified, the patient was brought the operating room and placed upon the operating table.  General anesthesia was induced.  Preoperative antibiotics were administered.  A surgical timeout was taken.  The right lower extremity was prepped and draped in standard sterile fashion with a tourniquet around the thigh.  The extremity was elevated and the tourniquet was inflated to 250 mmHg.  The laceration was identified at the dorsum of the right forefoot.  It was angular with a transverse component across the metatarsal shafts extending 4 cm.  The medial component then extended from the medial extent of the laceration distally into the dorsum of the first webspace.  This was a full-thickness flap of soft tissue distally and appeared well perfused.  The laceration was extended proximally and distally to allow adequate exposure.    Circumferential debridement was then performed with scissors from the level of the skin down through the subcutaneous tissues and into the muscle of the intrinsics as well as both fracture sites.  Scissors  were used as well as a rondure and curette.  After all devitalized tissue was removed the wound was irrigated with 3 L of normal saline.  The second metatarsal shaft fracture site was identified.  1 small fragment of nonviable bone was removed.  A 0.0625 K wire was then advanced down the distal shaft and out the plantar surface of the foot.  The fracture was reduced and held with a lobster claw clamp.  The K wire was then advanced retrograde across the fracture site  and into the metatarsal shaft engaging the proximal subchondral bone.  AP, oblique and lateral radiographs confirmed appropriate reduction of the second metatarsal shaft fracture.  The third metatarsal shaft fracture was then exposed.  Again the distal fragment was exposed and the K wire was inserted and advanced out through the plantar aspect of the forefoot.  The fracture was reduced and the K wire advanced retrograde across the fracture site to engage the third metatarsal base.  Radiographs confirmed appropriate reduction of the third metatarsal fracture.  Both pins were then bent, trimmed and capped.  The dorsal wound was then carefully repaired with 3-0 nylon Allgower Donati sutures as well as corner sutures.  Sterile dressings were applied followed by a well-padded short leg splint.  Tourniquet was released after application of the dressings.  The patient was awakened from anesthesia and transported to the recovery room in stable condition.   FOLLOW UP PLAN: The patient will be observed overnight for IV antibiotics per the open fracture protocol.  He will have physical therapy for nonweightbearing gait training.  We will hold chemoprophylaxis due to his risk of bleeding.   RADIOGRAPHS: AP, oblique and lateral radiographs of the right foot are obtained intraoperatively.  These show interval reduction of the second and third metatarsal shaft fractures.  Pins were appropriately positioned and of the appropriate lengths.  No other acute injuries are noted.

## 2020-09-02 NOTE — Progress Notes (Signed)
Patient informed this RN that PRN pain medication currently on MAR are too strong and "make him hallucinate." Called [facility] and spoke to rep that states MD will call shortly. No new orders at this time.

## 2020-09-02 NOTE — Anesthesia Preprocedure Evaluation (Signed)
Anesthesia Evaluation  Patient identified by MRN, date of birth, ID band Patient awake    Reviewed: Allergy & Precautions, NPO status , Patient's Chart, lab work & pertinent test results, reviewed documented beta blocker date and time   Airway Mallampati: I  TM Distance: >3 FB Neck ROM: Full    Dental  (+) Teeth Intact, Dental Advisory Given   Pulmonary  ILD   Pulmonary exam normal breath sounds clear to auscultation       Cardiovascular hypertension, Pt. on home beta blockers (-) angina+ CAD, + Past MI, + Cardiac Stents, + CABG, + Peripheral Vascular Disease and +CHF  + dysrhythmias Atrial Fibrillation  Rhythm:Irregular Rate:Normal     Neuro/Psych PSYCHIATRIC DISORDERS Anxiety negative neurological ROS     GI/Hepatic Neg liver ROS, GERD  Medicated,  Endo/Other  diabetes, Type 2Hypothyroidism   Renal/GU Renal InsufficiencyRenal disease     Musculoskeletal  (+) Arthritis , OPEN 2ND AND 3RD METATARSAL FRACTURE   Abdominal   Peds  Hematology negative hematology ROS (+)   Anesthesia Other Findings Day of surgery medications reviewed with the patient.  Reproductive/Obstetrics                             Anesthesia Physical Anesthesia Plan  ASA: III  Anesthesia Plan: General   Post-op Pain Management:    Induction: Intravenous  PONV Risk Score and Plan: 2 and Dexamethasone and Ondansetron  Airway Management Planned: LMA  Additional Equipment:   Intra-op Plan:   Post-operative Plan: Extubation in OR  Informed Consent: I have reviewed the patients History and Physical, chart, labs and discussed the procedure including the risks, benefits and alternatives for the proposed anesthesia with the patient or authorized representative who has indicated his/her understanding and acceptance.     Dental advisory given  Plan Discussed with: CRNA  Anesthesia Plan Comments:          Anesthesia Quick Evaluation

## 2020-09-02 NOTE — Progress Notes (Signed)
Upon arrival to PACU, patient's blood pressure 160-170/70-80s.  Patient takes 25mg  of PO lopressor daily and states he did take his dose this morning.  12.5mg  lopressor has been ordered BID with the next dose scheduled for 2200 this evening.  No new orders received.  Will continue to monitor.

## 2020-09-02 NOTE — Telephone Encounter (Signed)
New Message:     Pt says he have 2 appointment with Dr Geraldo Pitter, one for July and one for August. He wants to know which one is correct?

## 2020-09-02 NOTE — Consult Note (Signed)
Reason for Consult:Open foot fxs Referring Physician: Davonna Belling Time called: 3086 Time at bedside: Daniel Herrera is an 80 y.o. male.  HPI: Trestan was helping a neighbor load a hay baler when it slipped off the blocks and hit him in his foot. He had immediate pain and a significant laceration. He went to urgent care who diagnosed open MT fxs and he was directed to [facility] for further care.   Past Medical History:  Diagnosis Date  . Abnormal EKG 02/21/2015  . Acute lower GI bleeding 05/28/2019  . Anginal pain ([place]) 2016  . Angio-edema   . Anxiety   . Arthritis   . CAD (coronary artery disease)    a. 02/2015: DES to mid-LAD  . CAD S/P percutaneous coronary angioplasty    NSTEMI treated with LAD PCI with DES x 03 Feb 2015 Myoview low risk Nov 2017  . Cataract   . CHF (congestive heart failure) ([place]) 2020   after surgery only  . Chronic diarrhea of unknown origin   . CKD (chronic kidney disease) stage 3, GFR 30-59 ml/min 12/21/2017  . Colitis 05/23/2015  . Diverticular hemorrhage   . Diverticulosis   . Dyslipidemia, goal LDL below 70 02/21/2015   hyperlipidemia   . Dysrhythmia    A-fib  . Enterotoxigenic Escherichia coli infection 05/24/2015  . Essential hypertension 02/21/2015  . Geographic tongue   . GERD (gastroesophageal reflux disease)   . GI bleed 12/21/2017  . Glossitis, benign migratory 12/04/2018  . Gout   . Hematochezia 12/21/2017   Admitted Sept 2019 with lower GI bleeding felt to be secondary to hemorrhoids  . Hereditary angioedema type 2 ([place]) 07/28/2017  . High cholesterol   . History of kidney stones   . History of non-ST elevation myocardial infarction (NSTEMI) 02/22/2015   Nov 2016  . History of prolonged Q-T interval on ECG   . History of prostate cancer   . Hoarseness 12/04/2018  . Hx of Clostridium difficile infection   . Hx of umbilical hernia repair   . Hypertension   . Hypothyroidism   . ILD (interstitial lung disease) ([place]) 12/17/2013   ?related  to chicken farm exposure or aspiration pneumonitis from GERD Previously attributed to acute interstitial pneumonitis but based on HRCT this is likely IPF, "probable UIP"  . Interstitial lung disease ([place])   . Lower GI bleed 06/03/2019  . Malignant neoplasm of prostate ([place]) 08/07/2014  . Myocardial infarction ([place]) 2016  . NSTEMI (non-ST elevated myocardial infarction) ([place]) 05/21/2018  . PAF (paroxysmal atrial fibrillation) ([place]) 06/13/2018   Post CABG- discharged on Amiodarone, he will probably not need this long term  . Palpitations 03/15/2015   palpitations   . Peripheral vascular disease (HCC)    vericose veins  . Personal history of digestive disease    gastric ulcer  . Pre-diabetes   . Pre-operative clearance 01/02/2018  . Prostate cancer ([place])   . Prostate enlargement   . S/P CABG x 3 05/28/2018    LIMA-LAD, RIMA-RCA, and Lt radial to OM.  . Skin cancer    basal cell carcinoma  . STEC (Shiga toxin-producing Escherichia coli) infection 05/24/2015  . Tachycardia determined by examination of pulse   . Thyroid disease   . Type 2 diabetes mellitus with unspecified complications ([place])   . Urticaria     Past Surgical History:  Procedure Laterality Date  . APPENDECTOMY    . BACK SURGERY    . CARDIAC CATHETERIZATION N/A 02/21/2015  Procedure: Left Heart Cath;  Surgeon: Lorretta Harp, MD;  Location: [place] [facility];  Service: Cardiovascular;  Laterality: N/A;  . CATARACT EXTRACTION, BILATERAL    . CORONARY ARTERY BYPASS GRAFT N/A 05/24/2018   Procedure: CORONARY ARTERY BYPASS GRAFTING (CABG), ON PUMP, TIMES THREE, USING BILATERAL INTERNAL MAMMARY ARTERY AND HARVESTED LEFT RADIAL ARTERY;  Surgeon: Gaye Pollack, MD;  Location: [place];  Service: Open Heart Surgery;  Laterality: N/A;  . CYSTOSCOPY WITH LITHOLAPAXY N/A 07/26/2020   Procedure: CYSTOSCOPY WITH LITHOLAPAXY WITH FULGERATION;  Surgeon: Raynelle Bring, MD;  Location: [facility];  Service: Urology;  Laterality: N/A;  . HOLMIUM  LASER APPLICATION N/A 3/89/3734   Procedure: HOLMIUM LASER APPLICATION;  Surgeon: Raynelle Bring, MD;  [facility]: [facility];  Service: Urology;  Laterality: N/A;  . LEFT HEART CATH AND CORONARY ANGIOGRAPHY N/A 05/22/2018   Procedure: LEFT HEART CATH AND CORONARY ANGIOGRAPHY;  Surgeon: Jettie Booze, MD;  Location: [place] [facility];  Service: Cardiovascular;  Laterality: N/A;  . NASAL SINUS SURGERY    . PROSTATE BIOPSY    . RADIAL ARTERY HARVEST Left 05/24/2018   Procedure: RADIAL ARTERY HARVEST;  Surgeon: Gaye Pollack, MD;  Location: [place];  Service: Open Heart Surgery;  Laterality: Left;  . right shoulder rotator cuff repair    . TEE WITHOUT CARDIOVERSION N/A 05/24/2018   Procedure: TRANSESOPHAGEAL ECHOCARDIOGRAM (TEE);  Surgeon: Gaye Pollack, MD;  Location: [place];  Service: Open Heart Surgery;  Laterality: N/A;    Family History  Problem Relation Age of Onset  . Cancer Mother   . Stomach cancer Mother   . Cancer Father        prostate  . Cancer Sister     Social History:  reports that he has never smoked. He has never used smokeless tobacco. He reports that he does not drink alcohol and does not use drugs.  Allergies:  Allergies  Allergen Reactions  . Lisinopril Anaphylaxis, Swelling and Other (See Comments)    Face, lips, and throat swell  . Tape Other (See Comments)    PLASTIC TAPE TEARS OFF THE SKIN- Please use an alternative!!  . Benadryl [Diphenhydramine Hcl] Swelling and Other (See Comments)    Facial swelling from high dose (tolerates Advil PM as needed)  . Darvocet [Propoxyphene N-Acetaminophen] Other (See Comments)    Hallucinations   . Darvon [Propoxyphene Hcl] Other (See Comments)    Hallucination   . Tylenol [Acetaminophen] Swelling and Other (See Comments)    Foot swelling    Medications: I have reviewed the patient's current medications.  No results found for this or any previous visit (from the past 48 hour(s)).  DG Foot Complete Right  Result  Date: 09/02/2020 CLINICAL DATA:  Right foot injury. EXAM: RIGHT FOOT COMPLETE - 3+ VIEW COMPARISON:  None. FINDINGS: Acute fracture of the second metatarsal neck with 3 mm lateral displacement and 7 mm overriding. Acute fracture of the third metatarsal shaft with 5 mm lateral displacement. No additional fracture. No dislocation. Joint spaces are preserved. Osteopenia. Dorsal forefoot soft tissue swelling. IMPRESSION: 1. Acute displaced fractures of the second and third metatarsals. Electronically Signed   By: Titus Dubin M.D.   On: 09/02/2020 15:30    Review of Systems  HENT: Negative for ear discharge, ear pain, hearing loss and tinnitus.   Eyes: Negative for photophobia and pain.  Respiratory: Negative for cough and shortness of breath.   Cardiovascular: Negative for chest pain.  Gastrointestinal: Negative for  abdominal pain, nausea and vomiting.  Genitourinary: Negative for dysuria, flank pain, frequency and urgency.  Musculoskeletal: Positive for arthralgias (Right foot). Negative for back pain, myalgias and neck pain.  Neurological: Negative for dizziness and headaches.  Hematological: Does not bruise/bleed easily.  Psychiatric/Behavioral: The patient is not nervous/anxious.    Blood pressure (!) 144/99, pulse 73, temperature 98.6 F (37 C), resp. rate 18, SpO2 100 %. Physical Exam Constitutional:      General: He is not in acute distress.    Appearance: He is well-developed. He is not diaphoretic.  HENT:     Head: Normocephalic and atraumatic.  Eyes:     General: No scleral icterus.       Right eye: No discharge.        Left eye: No discharge.     Conjunctiva/sclera: Conjunctivae normal.  Cardiovascular:     Rate and Rhythm: Normal rate and regular rhythm.  Pulmonary:     Effort: Pulmonary effort is normal. No respiratory distress.  Musculoskeletal:     Cervical back: Normal range of motion.     Comments: LLE No traumatic wounds, ecchymosis, or rash  Mildly TTP, 5cm  laceration over distal forefoot  No knee or ankle effusion  Knee stable to varus/ valgus and anterior/posterior stress  Sens DPN, SPN, TN intact  Motor EHL, ext, flex, evers 5/5  DP 2+, PT 1+, No significant edema  Skin:    General: Skin is warm and dry.  Neurological:     Mental Status: He is alert.  Psychiatric:        Behavior: Behavior normal.     Assessment/Plan: Open left MT fxs -- Plan I&D, ORIF today by Dr. Doran Durand. Please keep NPO.    Lisette Abu, PA-C Orthopedic Surgery [phone] 09/02/2020, 3:51 PM

## 2020-09-02 NOTE — Progress Notes (Signed)
Received patient from PACU awake,alert/orientedx4 and able to verbalize needs. VSS; NAD noted; respirations easy/even on room air. Movement/sensation to all extremities noted. Dressing to RLE c/d/i sensation intact and able to wiggle toes. SCD on to LLE. Continuous pulse ox connected and IVF infusing at this time. Oriented to room and floor. White board updated. All safety measure in place and personal belongings within reach.

## 2020-09-02 NOTE — ED Provider Notes (Signed)
[facility] EMERGENCY DEPARTMENT Provider Note   CSN: [id number] Arrival date & time: 09/02/20  1438     History Chief Complaint  Patient presents with  . Foot Injury    Daniel Herrera is a 80 y.o. male.  HPI Patient presents after injury to right foot.  Got seen at urgent care and sent here.  Reportedly open fracture.  States he was using some equipment to bale hay and the equipment went onto his right foot.  Large laceration.  Pain.  Reportedly has 2 broken bones in there also.  Not on blood thinners.  Did eat breakfast this morning but has not had eat anything since.  No previous trauma with the foot.  Tetanus is up-to-date.    Past Medical History:  Diagnosis Date  . Abnormal EKG 02/21/2015  . Acute lower GI bleeding 05/28/2019  . Anginal pain ([place]) 2016  . Angio-edema   . Anxiety   . Arthritis   . CAD (coronary artery disease)    a. 02/2015: DES to mid-LAD  . CAD S/P percutaneous coronary angioplasty    NSTEMI treated with LAD PCI with DES x 03 Feb 2015 Myoview low risk Nov 2017  . Cataract   . CHF (congestive heart failure) ([place]) 2020   after surgery only  . Chronic diarrhea of unknown origin   . CKD (chronic kidney disease) stage 3, GFR 30-59 ml/min 12/21/2017  . Colitis 05/23/2015  . Diverticular hemorrhage   . Diverticulosis   . Dyslipidemia, goal LDL below 70 02/21/2015   hyperlipidemia   . Dysrhythmia    A-fib  . Enterotoxigenic Escherichia coli infection 05/24/2015  . Essential hypertension 02/21/2015  . Geographic tongue   . GERD (gastroesophageal reflux disease)   . GI bleed 12/21/2017  . Glossitis, benign migratory 12/04/2018  . Gout   . Hematochezia 12/21/2017   Admitted Sept 2019 with lower GI bleeding felt to be secondary to hemorrhoids  . Hereditary angioedema type 2 ([place]) 07/28/2017  . High cholesterol   . History of kidney stones   . History of non-ST elevation myocardial infarction (NSTEMI) 02/22/2015   Nov 2016  . History of  prolonged Q-T interval on ECG   . History of prostate cancer   . Hoarseness 12/04/2018  . Hx of Clostridium difficile infection   . Hx of umbilical hernia repair   . Hypertension   . Hypothyroidism   . ILD (interstitial lung disease) ([place]) 12/17/2013   ?related to chicken farm exposure or aspiration pneumonitis from GERD Previously attributed to acute interstitial pneumonitis but based on HRCT this is likely IPF, "probable UIP"  . Interstitial lung disease ([place])   . Lower GI bleed 06/03/2019  . Malignant neoplasm of prostate ([place]) 08/07/2014  . Myocardial infarction ([facility]) 2016  . NSTEMI (non-ST elevated myocardial infarction) ([place]) 05/21/2018  . PAF (paroxysmal atrial fibrillation) ([place]) 06/13/2018   Post CABG- discharged on Amiodarone, he will probably not need this long term  . Palpitations 03/15/2015   palpitations   . Peripheral vascular disease (HCC)    vericose veins  . Personal history of digestive disease    gastric ulcer  . Pre-diabetes   . Pre-operative clearance 01/02/2018  . Prostate cancer ([place])   . Prostate enlargement   . S/P CABG x 3 05/28/2018    LIMA-LAD, RIMA-RCA, and Lt radial to OM.  . Skin cancer    basal cell carcinoma  . STEC (Shiga toxin-producing Escherichia coli) infection 05/24/2015  . Tachycardia determined  by examination of pulse   . Thyroid disease   . Type 2 diabetes mellitus with unspecified complications ([place])   . Urticaria     Patient Active Problem List   Diagnosis Date Noted  . Dysrhythmia   . Geographic tongue   . History of kidney stones   . Hypothyroidism   . Interstitial lung disease ([place])   . Peripheral vascular disease ([place])   . Pre-diabetes   . Urticaria   . Thyroid disease   . Skin cancer   . Prostate enlargement   . Prostate cancer ([place])   . Personal history of digestive disease   . Myocardial infarction ([place])   . Hypertension   . Hx of umbilical hernia repair   . Hx of Clostridium difficile infection   . History of prolonged  Q-T interval on ECG   . High cholesterol   . Gout   . GERD (gastroesophageal reflux disease)   . Arthritis   . Anxiety   . Angio-edema   . Cataract   . Diverticular hemorrhage   . Lower GI bleed 06/03/2019  . Acute lower GI bleeding 05/28/2019  . CAD (coronary artery disease) 05/28/2019  . Diverticulosis   . Glossitis, benign migratory 12/04/2018  . Hoarseness 12/04/2018  . PAF (paroxysmal atrial fibrillation) ([place]) 06/13/2018  . S/P CABG x 3 05/28/2018  . NSTEMI (non-ST elevated myocardial infarction) ([place]) 05/21/2018  . Tachycardia determined by examination of pulse   . CHF (congestive heart failure) ([place]) 2020  . Pre-operative clearance 01/02/2018  . Hematochezia 12/21/2017  . CKD (chronic kidney disease) stage 3, GFR 30-59 ml/min 12/21/2017  . GI bleed 12/21/2017  . Hereditary angioedema type 2 ([place]) 07/28/2017  . Enterotoxigenic Escherichia coli infection 05/24/2015  . STEC (Shiga toxin-producing Escherichia coli) infection 05/24/2015  . Colitis 05/23/2015  . History of prostate cancer   . Chronic diarrhea of unknown origin   . CAD S/P percutaneous coronary angioplasty   . Type 2 diabetes mellitus with unspecified complications ([place])   . Palpitations 03/15/2015  . History of non-ST elevation myocardial infarction (NSTEMI) 02/22/2015  . Abnormal EKG 02/21/2015  . Essential hypertension 02/21/2015  . Dyslipidemia, goal LDL below 70 02/21/2015  . Malignant neoplasm of prostate ([place]) 08/07/2014  . Anginal pain ([place]) 2016  . ILD (interstitial lung disease) ([place]) 12/17/2013    Past Surgical History:  Procedure Laterality Date  . APPENDECTOMY    . BACK SURGERY    . CARDIAC CATHETERIZATION N/A 02/21/2015   Procedure: Left Heart Cath;  Surgeon: Lorretta Harp, MD;  Location: [facility];  Service: Cardiovascular;  Laterality: N/A;  . CATARACT EXTRACTION, BILATERAL    . CORONARY ARTERY BYPASS GRAFT N/A 05/24/2018   Procedure: CORONARY ARTERY BYPASS GRAFTING  (CABG), ON PUMP, TIMES THREE, USING BILATERAL INTERNAL MAMMARY ARTERY AND HARVESTED LEFT RADIAL ARTERY;  Surgeon: Gaye Pollack, MD;  Location: [facility];  Service: Open Heart Surgery;  Laterality: N/A;  . CYSTOSCOPY WITH LITHOLAPAXY N/A 07/26/2020   Procedure: CYSTOSCOPY WITH LITHOLAPAXY WITH FULGERATION;  Surgeon: Raynelle Bring, MD;  [facility];  Service: Urology;  Laterality: N/A;  . HOLMIUM LASER APPLICATION N/A 4/94/4967   Procedure: HOLMIUM LASER APPLICATION;  Surgeon: Raynelle Bring, MD;  [facility];  Service: Urology;  Laterality: N/A;  . LEFT HEART CATH AND CORONARY ANGIOGRAPHY N/A 05/22/2018   Procedure: LEFT HEART CATH AND CORONARY ANGIOGRAPHY;  Surgeon: Jettie Booze, MD;  Location: [facility];  Service: Cardiovascular;  Laterality:  N/A;  . NASAL SINUS SURGERY    . PROSTATE BIOPSY    . RADIAL ARTERY HARVEST Left 05/24/2018   Procedure: RADIAL ARTERY HARVEST;  Surgeon: Gaye Pollack, MD;  Location: [facility];  Service: Open Heart Surgery;  Laterality: Left;  . right shoulder rotator cuff repair    . TEE WITHOUT CARDIOVERSION N/A 05/24/2018   Procedure: TRANSESOPHAGEAL ECHOCARDIOGRAM (TEE);  Surgeon: Gaye Pollack, MD;  Location: [facility];  Service: Open Heart Surgery;  Laterality: N/A;       Family History  Problem Relation Age of Onset  . Cancer Mother   . Stomach cancer Mother   . Cancer Father        prostate  . Cancer Sister     Social History   Tobacco Use  . Smoking status: Never Smoker  . Smokeless tobacco: Never Used  Vaping Use  . Vaping Use: Never used  Substance Use Topics  . Alcohol use: Never  . Drug use: No    Home Medications Prior to Admission medications   Medication Sig Start Date End Date Taking? Authorizing Provider  levothyroxine (SYNTHROID) 100 MCG tablet Take 100 mcg by mouth daily before breakfast.   Yes [provider]  metoprolol tartrate (LOPRESSOR) 25 MG tablet Take 0.5 tablets (12.5 mg total) by mouth 2  (two) times daily. 05/29/19  Yes Shelly Coss, MD  aspirin EC 81 MG tablet Take 81 mg by mouth daily. Swallow whole.    [provider]  azelastine (ASTELIN) 0.1 % nasal spray Place 1 spray into both nostrils 2 (two) times daily. Use in each nostril as directed    [provider]  cephALEXin (KEFLEX) 500 MG capsule Take 1 capsule (500 mg total) by mouth 4 (four) times daily. 07/27/20   Lajean Saver, MD  Cyanocobalamin (VITAMIN B-12) 5000 MCG TBDP Take 5,000 mcg by mouth daily.     [provider]  dextromethorphan-guaiFENesin (MUCINEX DM) 30-600 MG 12hr tablet Take 1 tablet by mouth in the morning and at bedtime.    [provider]  EPINEPHrine 0.3 mg/0.3 mL IJ SOAJ injection Inject 0.3 mg into the muscle as needed for anaphylaxis.    [provider]  ezetimibe (ZETIA) 10 MG tablet Take 1 tablet (10 mg total) by mouth daily. 02/24/19 05/27/28  Lorretta Harp, MD  fluoruracil [facility]) 0.5 % cream Apply 1 application topically as needed for rash. For skin cancer flare up    [provider]  fluticasone (FLONASE) 50 MCG/ACT nasal spray Place 1 spray into both nostrils 2 (two) times daily.    [provider]  icatibant (FIRAZYR) 30 MG/3ML injection Inject 3 mLs (30 mg total) into the skin once as needed (for sudden attacks of hereditary angioedema). 10/22/19   Kozlow, Donnamarie Poag, MD  montelukast (SINGULAIR) 10 MG tablet Take 1 tablet (10 mg total) by mouth at bedtime. 09/10/18   Kozlow, Donnamarie Poag, MD  nabumetone (RELAFEN) 750 MG tablet Take 750 mg by mouth 2 (two) times daily. 08/09/19   [provider]  nitroGLYCERIN (NITROSTAT) 0.4 MG SL tablet Place 0.4 mg under the tongue every 5 (five) minutes as needed for chest pain.    [provider]  NONFORMULARY OR COMPOUNDED ITEM Use as directed 5-15 mLs in the mouth or throat See admin instructions. Compounded mouth wash/gargle:1/1/1/1/1 (80 ml's of each) Purified water; Lidocaine Viscous;  Nystatin suspension; Prednisolone 15/5; Maalox: Swish and spit using 5-15 ml's every 6 hours as needed for irritation  [provider]  OVER THE COUNTER MEDICATION Take 1 capsule by mouth in the morning and at bedtime. Health Plus/Super Colon Cleanse    [provider]  pantoprazole (PROTONIX) 40 MG tablet Take 40 mg by mouth 2 (two) times daily.    [provider]  phenazopyridine (PYRIDIUM) 200 MG tablet Take 1 tablet (200 mg total) by mouth 3 (three) times daily as needed (burning with urination). 07/26/20   Raynelle Bring, MD  Pitavastatin Calcium (LIVALO) 4 MG TABS Take by mouth at bedtime.    [provider]  Probiotic Product (PROBIOTIC DAILY PO) Take 1 capsule by mouth daily.    [provider]  Tadalafil 2.5 MG TABS Take 1 tablet by mouth daily. 07/09/20   [provider]  TAKHZYRO 300 MG/2ML SOLN Inject 300 mg into the muscle every 14 (fourteen) days. 11/24/19   Kozlow, Donnamarie Poag, MD    Allergies    Lisinopril, Tape, Benadryl [diphenhydramine hcl], Darvocet [propoxyphene n-acetaminophen], Darvon [propoxyphene hcl], and Tylenol [acetaminophen]  Review of Systems   Review of Systems  Constitutional: Negative for appetite change.  Respiratory: Negative for shortness of breath.   Musculoskeletal: Negative for back pain.       Right foot injury with laceration.  Skin: Positive for wound.  Neurological: Negative for weakness and numbness.  Psychiatric/Behavioral: Negative for confusion.    Physical Exam Updated Vital Signs BP (!) 188/92   Pulse 73   Temp (!) 97.5 F (36.4 C) (Oral)   Resp 18   Ht 6\' 1"  (1.854 m)   Wt 88 kg   SpO2 100%   BMI 25.60 kg/m   Physical Exam Vitals and nursing note reviewed.  HENT:     Head: Atraumatic.  Chest:     Chest wall: No tenderness.  Abdominal:     Tenderness: There is no abdominal tenderness.  Musculoskeletal:        General: Tenderness present.     Cervical back: Neck supple.      Comments: Tenderness and laceration over the dorsum of right foot.  Approximately 7 cm long.  Third and fourth toes mildly dorsiflexed.  Able to plantar and dorsiflex toes however.  No exposed bone.  Neurological:     General: No focal deficit present.     Mental Status: He is alert.     ED Results / Procedures / Treatments   Labs (all labs ordered are listed, but only abnormal results are displayed) Labs Reviewed  BRAIN NATRIURETIC PEPTIDE - Abnormal; Notable for the following components:      Result Value   B Natriuretic Peptide 150.2 (*)    All other components within normal limits  GLUCOSE, CAPILLARY - Abnormal; Notable for the following components:   Glucose-Capillary 105 (*)    All other components within normal limits  POCT I-STAT, CHEM 8 - Abnormal; Notable for the following components:   BUN 27 (*)    Creatinine, Ser 1.40 (*)    Glucose, Bld 109 (*)    All other components within normal limits  RESP PANEL BY RT-PCR (FLU A&B, COVID) ARPGX2  SURGICAL PCR SCREEN  CBC  BASIC METABOLIC PANEL    EKG None  Radiology DG Foot Complete Right  Result Date: 09/02/2020 CLINICAL DATA:  Right foot injury. EXAM: RIGHT FOOT COMPLETE - 3+ VIEW COMPARISON:  None. FINDINGS: Acute fracture of the second metatarsal neck with 3 mm lateral displacement and 7 mm overriding. Acute fracture of the third metatarsal shaft with 5 mm  lateral displacement. No additional fracture. No dislocation. Joint spaces are preserved. Osteopenia. Dorsal forefoot soft tissue swelling. IMPRESSION: 1. Acute displaced fractures of the second and third metatarsals. Electronically Signed   By: Titus Dubin M.D.   On: 09/02/2020 15:30   DG MINI C-ARM IMAGE ONLY  Result Date: 09/02/2020 There is no interpretation for this exam.  This order is for images obtained during a surgical procedure.  Please See "Surgeries" Tab for more information regarding the procedure.    Procedures Procedures   Medications Ordered in  ED Medications  ceFAZolin (ANCEF) IVPB 2g/100 mL premix ( Intravenous MAR Hold 09/02/20 1636)  chlorhexidine (HIBICLENS) 4 % liquid 4 application (has no administration in time range)  ceFAZolin (ANCEF) IVPB 2g/100 mL premix (has no administration in time range)  lactated ringers infusion ( Intravenous New Bag/Given 09/02/20 1708)  povidone-iodine 10 % swab 2 application (2 application Topical Given 09/02/20 1708)  chlorhexidine (PERIDEX) 0.12 % solution 15 mL (15 mLs Mouth/Throat Given 09/02/20 1707)    Or  MEDLINE mouth rinse ( Mouth Rinse See Alternative 09/02/20 1707)    ED Course  I have reviewed the triage vital signs and the nursing notes.  Pertinent labs & imaging results that were available during my care of the patient were reviewed by me and considered in my medical decision making (see chart for details).    MDM Rules/Calculators/A&P                          Patient has foot laceration and underlying fractures.  Likely open fractures.  Antibiotics given.  Tetanus is already up-to-date.  Consulted Ortho who will take to the OR for washout. Final Clinical Impression(s) / ED Diagnoses Final diagnoses:  Multiple open fractures of metatarsal bone of right foot, initial encounter    Rx / DC Orders ED Discharge Orders    None       Davonna Belling, MD 09/02/20 1733

## 2020-09-02 NOTE — Anesthesia Procedure Notes (Signed)
Procedure Name: LMA Insertion Date/Time: 09/02/2020 5:48 PM Performed by: Georgia Duff, CRNA Pre-anesthesia Checklist: Patient identified, Emergency Drugs available, Suction available and Patient being monitored Patient Re-evaluated:Patient Re-evaluated prior to induction Oxygen Delivery Method: Circle System Utilized Preoxygenation: Pre-oxygenation with 100% oxygen Induction Type: IV induction Ventilation: Mask ventilation without difficulty LMA: LMA inserted LMA Size: 4.0 Number of attempts: 1 Airway Equipment and Method: Bite block Placement Confirmation: positive ETCO2 Tube secured with: Tape Dental Injury: Teeth and Oropharynx as per pre-operative assessment

## 2020-09-02 NOTE — H&P (Signed)
Daniel Herrera is an 80 y.o. male.   Chief Complaint:  Right foot pain HPI: The patient is 80 year old male with a past medical history significant for coronary artery disease and diabetes.  He was helping a friend load a hay bailer when the tongue slipped off the block and landed on the dorsum of his right foot.  He sustained a laceration and was seen at [facility] urgent care.  He was then transferred to the [facility] emergency department for further evaluation and treatment of this injury.  He denies any history of injury or surgery to the foot.  He complains of aching pain in the foot worse with any attempted motion and better with rest.  He is not a smoker.  He is accompanied by his daughter.  Area  Past Medical History:  Diagnosis Date  . Abnormal EKG 02/21/2015  . Acute lower GI bleeding 05/28/2019  . Anginal pain ([place]) 2016  . Angio-edema   . Anxiety   . Arthritis   . CAD (coronary artery disease)    a. 02/2015: DES to mid-LAD  . CAD S/P percutaneous coronary angioplasty    NSTEMI treated with LAD PCI with DES x 03 Feb 2015 Myoview low risk Nov 2017  . Cataract   . CHF (congestive heart failure) ([place]) 2020   after surgery only  . Chronic diarrhea of unknown origin   . CKD (chronic kidney disease) stage 3, GFR 30-59 ml/min 12/21/2017  . Colitis 05/23/2015  . Diverticular hemorrhage   . Diverticulosis   . Dyslipidemia, goal LDL below 70 02/21/2015   hyperlipidemia   . Dysrhythmia    A-fib  . Enterotoxigenic Escherichia coli infection 05/24/2015  . Essential hypertension 02/21/2015  . Geographic tongue   . GERD (gastroesophageal reflux disease)   . GI bleed 12/21/2017  . Glossitis, benign migratory 12/04/2018  . Gout   . Hematochezia 12/21/2017   Admitted Sept 2019 with lower GI bleeding felt to be secondary to hemorrhoids  . Hereditary angioedema type 2 ([place]) 07/28/2017  . High cholesterol   . History of kidney stones   . History of non-ST elevation myocardial infarction  (NSTEMI) 02/22/2015   Nov 2016  . History of prolonged Q-T interval on ECG   . History of prostate cancer   . Hoarseness 12/04/2018  . Hx of Clostridium difficile infection   . Hx of umbilical hernia repair   . Hypertension   . Hypothyroidism   . ILD (interstitial lung disease) ([place]) 12/17/2013   ?related to chicken farm exposure or aspiration pneumonitis from GERD Previously attributed to acute interstitial pneumonitis but based on HRCT this is likely IPF, "probable UIP"  . Interstitial lung disease ([place])   . Lower GI bleed 06/03/2019  . Malignant neoplasm of prostate ([place]) 08/07/2014  . Myocardial infarction ([place]) 2016  . NSTEMI (non-ST elevated myocardial infarction) ([place]) 05/21/2018  . PAF (paroxysmal atrial fibrillation) ([place]) 06/13/2018   Post CABG- discharged on Amiodarone, he will probably not need this long term  . Palpitations 03/15/2015   palpitations   . Peripheral vascular disease ([facility])    vericose veins  . Personal history of digestive disease    gastric ulcer  . Pre-diabetes   . Pre-operative clearance 01/02/2018  . Prostate cancer ([place])   . Prostate enlargement   . S/P CABG x 3 05/28/2018    LIMA-LAD, RIMA-RCA, and Lt radial to OM.  . Skin cancer    basal cell carcinoma  . STEC (Shiga toxin-producing Escherichia  coli) infection 05/24/2015  . Tachycardia determined by examination of pulse   . Thyroid disease   . Type 2 diabetes mellitus with unspecified complications ([place])   . Urticaria     Past Surgical History:  Procedure Laterality Date  . APPENDECTOMY    . BACK SURGERY    . CARDIAC CATHETERIZATION N/A 02/21/2015   Procedure: Left Heart Cath;  Surgeon: Lorretta Harp, MD;  Location: [place] [facility];  Service: Cardiovascular;  Laterality: N/A;  . CATARACT EXTRACTION, BILATERAL    . CORONARY ARTERY BYPASS GRAFT N/A 05/24/2018   Procedure: CORONARY ARTERY BYPASS GRAFTING (CABG), ON PUMP, TIMES THREE, USING BILATERAL INTERNAL MAMMARY ARTERY AND HARVESTED LEFT  RADIAL ARTERY;  Surgeon: Gaye Pollack, MD;  Location: [place];  Service: Open Heart Surgery;  Laterality: N/A;  . CYSTOSCOPY WITH LITHOLAPAXY N/A 07/26/2020   Procedure: CYSTOSCOPY WITH LITHOLAPAXY WITH FULGERATION;  Surgeon: Raynelle Bring, MD;  Location: [facility];  Service: Urology;  Laterality: N/A;  . HOLMIUM LASER APPLICATION N/A 10/13/4578   Procedure: HOLMIUM LASER APPLICATION;  Surgeon: Raynelle Bring, MD;  Location: [facility];  Service: Urology;  Laterality: N/A;  . LEFT HEART CATH AND CORONARY ANGIOGRAPHY N/A 05/22/2018   Procedure: LEFT HEART CATH AND CORONARY ANGIOGRAPHY;  Surgeon: Jettie Booze, MD;  Location: [facility];  Service: Cardiovascular;  Laterality: N/A;  . NASAL SINUS SURGERY    . PROSTATE BIOPSY    . RADIAL ARTERY HARVEST Left 05/24/2018   Procedure: RADIAL ARTERY HARVEST;  Surgeon: Gaye Pollack, MD;  Location: [facility];  Service: Open Heart Surgery;  Laterality: Left;  . right shoulder rotator cuff repair    . TEE WITHOUT CARDIOVERSION N/A 05/24/2018   Procedure: TRANSESOPHAGEAL ECHOCARDIOGRAM (TEE);  Surgeon: Gaye Pollack, MD;  Location: [facility];  Service: Open Heart Surgery;  Laterality: N/A;    Family History  Problem Relation Age of Onset  . Cancer Mother   . Stomach cancer Mother   . Cancer Father        prostate  . Cancer Sister    Social History:  reports that he has never smoked. He has never used smokeless tobacco. He reports that he does not drink alcohol and does not use drugs.  Allergies:  Allergies  Allergen Reactions  . Lisinopril Anaphylaxis, Swelling and Other (See Comments)    Face, lips, and throat swell  . Tape Other (See Comments)    PLASTIC TAPE TEARS OFF THE SKIN- Please use an alternative!!  . Benadryl [Diphenhydramine Hcl] Swelling and Other (See Comments)    Facial swelling from high dose (tolerates Advil PM as needed)  . Darvocet [Propoxyphene N-Acetaminophen] Other (See Comments)    Hallucinations   . Darvon  [Propoxyphene Hcl] Other (See Comments)    Hallucination   . Tylenol [Acetaminophen] Swelling and Other (See Comments)    Foot swelling    Medications Prior to Admission  Medication Sig Dispense Refill  . levothyroxine (SYNTHROID) 100 MCG tablet Take 100 mcg by mouth daily before breakfast.    . metoprolol tartrate (LOPRESSOR) 25 MG tablet Take 0.5 tablets (12.5 mg total) by mouth 2 (two) times daily. 90 tablet 6  . aspirin EC 81 MG tablet Take 81 mg by mouth daily. Swallow whole.    [name] azelastine (ASTELIN) 0.1 % nasal spray Place 1 spray into both nostrils 2 (two) times daily. Use in each nostril as directed    . cephALEXin (KEFLEX) 500 MG capsule Take 1 capsule (500  mg total) by mouth 4 (four) times daily. 28 capsule 0  . Cyanocobalamin (VITAMIN B-12) 5000 MCG TBDP Take 5,000 mcg by mouth daily.     [name] dextromethorphan-guaiFENesin (MUCINEX DM) 30-600 MG 12hr tablet Take 1 tablet by mouth in the morning and at bedtime.    [name] EPINEPHrine 0.3 mg/0.3 mL IJ SOAJ injection Inject 0.3 mg into the muscle as needed for anaphylaxis.    [name] ezetimibe (ZETIA) 10 MG tablet Take 1 tablet (10 mg total) by mouth daily. 90 tablet 3  . fluoruracil (CARAC) 0.5 % cream Apply 1 application topically as needed for rash. For skin cancer flare up    . fluticasone (FLONASE) 50 MCG/ACT nasal spray Place 1 spray into both nostrils 2 (two) times daily.    [name] icatibant (FIRAZYR) 30 MG/3ML injection Inject 3 mLs (30 mg total) into the skin once as needed (for sudden attacks of hereditary angioedema). 9 mL 5  . montelukast (SINGULAIR) 10 MG tablet Take 1 tablet (10 mg total) by mouth at bedtime. 30 tablet 5  . nabumetone (RELAFEN) 750 MG tablet Take 750 mg by mouth 2 (two) times daily.    . nitroGLYCERIN (NITROSTAT) 0.4 MG SL tablet Place 0.4 mg under the tongue every 5 (five) minutes as needed for chest pain.    . NONFORMULARY OR COMPOUNDED ITEM Use as directed 5-15 mLs in the mouth or throat See admin instructions.  Compounded mouth wash/gargle:1/1/1/1/1 (80 ml's of each) Purified water; Lidocaine Viscous; Nystatin suspension; Prednisolone 15/5; Maalox: Swish and spit using 5-15 ml's every 6 hours as needed for irritation    . OVER THE COUNTER MEDICATION Take 1 capsule by mouth in the morning and at bedtime. Health Plus/Super Colon Cleanse    . pantoprazole (PROTONIX) 40 MG tablet Take 40 mg by mouth 2 (two) times daily.    . phenazopyridine (PYRIDIUM) 200 MG tablet Take 1 tablet (200 mg total) by mouth 3 (three) times daily as needed (burning with urination). 20 tablet 0  . Pitavastatin Calcium (LIVALO) 4 MG TABS Take by mouth at bedtime.    . Probiotic Product (PROBIOTIC DAILY PO) Take 1 capsule by mouth daily.    . Tadalafil 2.5 MG TABS Take 1 tablet by mouth daily.    [name] TAKHZYRO 300 MG/2ML SOLN Inject 300 mg into the muscle every 14 (fourteen) days. 4 mL 11    Results for orders placed or performed during the hospital encounter of 09/02/20 (from the past 48 hour(s))  Brain natriuretic peptide     Status: Abnormal   Collection Time: 09/02/20  3:27 PM  Result Value Ref Range   B Natriuretic Peptide 150.2 (H) 0.0 - 100.0 pg/mL    Comment: Performed at [facility], [address], [place] [address]  CBC     Status: None   Collection Time: 09/02/20  3:27 PM  Result Value Ref Range   WBC 6.0 4.0 - 10.5 K/uL   RBC 4.25 4.22 - 5.81 MIL/uL   Hemoglobin 13.7 13.0 - 17.0 g/dL   HCT 40.6 39.0 - 52.0 %   MCV 95.5 80.0 - 100.0 fL   MCH 32.2 26.0 - 34.0 pg   MCHC 33.7 30.0 - 36.0 g/dL   RDW 12.9 11.5 - 15.5 %   Platelets 161 150 - 400 K/uL   nRBC 0.0 0.0 - 0.2 %    Comment: Performed at [facility], [place] [address], [place], [place] [address]  Resp Panel by RT-PCR (Flu  A&B, Covid) Nasopharyngeal Swab     Status: None   Collection Time: 09/02/20  3:50 PM   Specimen: Nasopharyngeal Swab; Nasopharyngeal(NP) swabs in vial transport medium  Result Value Ref Range   SARS Coronavirus 2 by  RT PCR NEGATIVE NEGATIVE    Comment: (NOTE) SARS-CoV-2 target nucleic acids are NOT DETECTED.  The SARS-CoV-2 RNA is generally detectable in upper respiratory specimens during the acute phase of infection. The lowest concentration of SARS-CoV-2 viral copies this assay can detect is 138 copies/mL. A negative result does not preclude SARS-Cov-2 infection and should not be used as the sole basis for treatment or other patient management decisions. A negative result may occur with  improper specimen collection/handling, submission of specimen other than nasopharyngeal swab, presence of viral mutation(s) within the areas targeted by this assay, and inadequate number of viral copies(<138 copies/mL). A negative result must be combined with clinical observations, patient history, and epidemiological information. The expected result is Negative.  Fact Sheet for Patients:  EntrepreneurPulse.com.au  Fact Sheet for Healthcare Providers:  IncredibleEmployment.be  This test is no t yet approved or cleared by the [place] FDA and  has been authorized for detection and/or diagnosis of SARS-CoV-2 by FDA under an Emergency Use Authorization (EUA). This EUA will remain  in effect (meaning this test can be used) for the duration of the COVID-19 declaration under Section 564(b)(1) of the Act, 21 U.S.C.section 360bbb-3(b)(1), unless the authorization is terminated  or revoked sooner.       Influenza A by PCR NEGATIVE NEGATIVE   Influenza B by PCR NEGATIVE NEGATIVE    Comment: (NOTE) The Xpert Xpress SARS-CoV-2/FLU/RSV plus assay is intended as an aid in the diagnosis of influenza from Nasopharyngeal swab specimens and should not be used as a sole basis for treatment. Nasal washings and aspirates are unacceptable for Xpert Xpress SARS-CoV-2/FLU/RSV testing.  Fact Sheet for Patients: EntrepreneurPulse.com.au  Fact Sheet for Healthcare  Providers: IncredibleEmployment.be  This test is not yet approved or cleared by the [place] FDA and has been authorized for detection and/or diagnosis of SARS-CoV-2 by FDA under an Emergency Use Authorization (EUA). This EUA will remain in effect (meaning this test can be used) for the duration of the COVID-19 declaration under Section 564(b)(1) of the Act, 21 U.S.C. section 360bbb-3(b)(1), unless the authorization is terminated or revoked.  Performed at [facility], [place] [address], [place], [place] [address]   Glucose, capillary     Status: Abnormal   Collection Time: 09/02/20  4:51 PM  Result Value Ref Range   Glucose-Capillary 105 (H) 70 - 99 mg/dL    Comment: Glucose reference range applies only to samples taken after fasting for at least 8 hours.  I-STAT, chem 8     Status: Abnormal   Collection Time: 09/02/20  4:56 PM  Result Value Ref Range   Sodium 142 135 - 145 mmol/L   Potassium 3.8 3.5 - 5.1 mmol/L   Chloride 107 98 - 111 mmol/L   BUN 27 (H) 8 - 23 mg/dL   Creatinine, Ser 1.40 (H) 0.61 - 1.24 mg/dL   Glucose, Bld 109 (H) 70 - 99 mg/dL    Comment: Glucose reference range applies only to samples taken after fasting for at least 8 hours.   Calcium, Ion 1.25 1.15 - 1.40 mmol/L   TCO2 23 22 - 32 mmol/L   Hemoglobin 13.9 13.0 - 17.0 g/dL   HCT 41.0 39.0 - 52.0 %   DG Foot Complete  Right  Result Date: 09/02/2020 CLINICAL DATA:  Right foot injury. EXAM: RIGHT FOOT COMPLETE - 3+ VIEW COMPARISON:  None. FINDINGS: Acute fracture of the second metatarsal neck with 3 mm lateral displacement and 7 mm overriding. Acute fracture of the third metatarsal shaft with 5 mm lateral displacement. No additional fracture. No dislocation. Joint spaces are preserved. Osteopenia. Dorsal forefoot soft tissue swelling. IMPRESSION: 1. Acute displaced fractures of the second and third metatarsals. Electronically Signed   By: Titus Dubin M.D.   On: 09/02/2020 15:30     Review of Systems no recent fever, chills, nausea, vomiting or changes in his appetite.  10 system review was otherwise negative.  Blood pressure (!) 188/92, pulse 73, temperature (!) 97.5 F (36.4 C), temperature source Oral, resp. rate 18, height 6\' 1"  (1.854 m), weight 88 kg, SpO2 100 %. Physical Exam  Well-nourished well-developed man in no apparent distress.  Alert and oriented x4.  Normal mood and affect.  Gait is antalgic to the right.  The right foot has a transverse laceration approximately 5 cm across the distal forefoot.  Toes have brisk capillary refill.  No gross contamination.  No lymphadenopathy.  Intact sensibility to light touch dorsally and plantarly at the forefoot.  Active plantar flexion dorsiflexion strength of the toes.  X-rays: 3 views nonweightbearing of the right foot are reviewed.  These show displaced fractures of the second and third metatarsal necks.    Assessment/Plan Open right second and third metatarsal neck fractures with crush injury to the right foot and laceration -to the operating room today for irrigation and debridement of the open fractures and open treatment with internal fixation.The risks and benefits of the alternative treatment options have been discussed in detail.  The patient wishes to proceed with surgery and specifically understands risks of bleeding, infection, nerve damage, blood clots, need for additional surgery, amputation and death. He will require observation overnight for 24 hours of IV antibiotics.  Wylene Simmer, MD 09/02/2020, 5:20 PM

## 2020-09-02 NOTE — Transfer of Care (Signed)
Immediate Anesthesia Transfer of Care Note  Patient: [name]  Procedure(s) Performed: IRRIGATION AND DEBRIDEMENT RIGHT 2ND AND 3RD METATARSAL (Right ) OPEN REDUCTION INTERNAL FIXATION (ORIF) OF RIGHT OPEN 2ND AND 3RD METATARSAL (TOE) OPEN FRACTURE (Right Toe)  Patient Location: PACU  Anesthesia Type:General  Level of Consciousness: awake, alert  and oriented  Airway & Oxygen Therapy: Patient Spontanous Breathing and Patient connected to face mask oxygen  Post-op Assessment: Report given to RN and Post -op Vital signs reviewed and stable  Post vital signs: Reviewed and stable  Last Vitals:  Vitals Value Taken Time  BP 168/100 09/02/20 1856  Temp    Pulse 66 09/02/20 1858  Resp 15 09/02/20 1858  SpO2 100 % 09/02/20 1858  Vitals shown include unvalidated device data.  Last Pain:  Vitals:   09/02/20 1659  TempSrc: Oral  PainSc: 3          Complications: No complications documented.

## 2020-09-02 NOTE — Anesthesia Postprocedure Evaluation (Signed)
Anesthesia Post Note  Patient: AZHAR KNOPE  Procedure(s) Performed: IRRIGATION AND DEBRIDEMENT RIGHT 2ND AND 3RD METATARSAL (Right ) OPEN REDUCTION INTERNAL FIXATION (ORIF) OF RIGHT OPEN 2ND AND 3RD METATARSAL (TOE) OPEN FRACTURE (Right Toe)     Patient location during evaluation: PACU Anesthesia Type: General Level of consciousness: awake and alert Pain management: pain level controlled Vital Signs Assessment: post-procedure vital signs reviewed and stable Respiratory status: spontaneous breathing, nonlabored ventilation and respiratory function stable Cardiovascular status: stable and blood pressure returned to baseline Anesthetic complications: no   No complications documented.  Last Vitals:  Vitals:   09/02/20 2022 09/02/20 2109  BP: (!) 160/83 (!) 165/76  Pulse: 64 60  Resp: 16 15  Temp: (!) 36.3 C (!) 36.4 C  SpO2: 96% 100%    Last Pain:  Vitals:   09/02/20 2109  TempSrc: Oral  PainSc:                  Audry Pili

## 2020-09-03 ENCOUNTER — Encounter (HOSPITAL_COMMUNITY): Payer: Self-pay | Admitting: Orthopedic Surgery

## 2020-09-03 DIAGNOSIS — N183 Chronic kidney disease, stage 3 unspecified: Secondary | ICD-10-CM | POA: Diagnosis not present

## 2020-09-03 DIAGNOSIS — S92331A Displaced fracture of third metatarsal bone, right foot, initial encounter for closed fracture: Secondary | ICD-10-CM | POA: Diagnosis not present

## 2020-09-03 DIAGNOSIS — S9781XA Crushing injury of right foot, initial encounter: Secondary | ICD-10-CM | POA: Diagnosis present

## 2020-09-03 DIAGNOSIS — E7800 Pure hypercholesterolemia, unspecified: Secondary | ICD-10-CM | POA: Diagnosis present

## 2020-09-03 DIAGNOSIS — Z20822 Contact with and (suspected) exposure to covid-19: Secondary | ICD-10-CM | POA: Diagnosis not present

## 2020-09-03 DIAGNOSIS — E785 Hyperlipidemia, unspecified: Secondary | ICD-10-CM | POA: Diagnosis present

## 2020-09-03 DIAGNOSIS — I252 Old myocardial infarction: Secondary | ICD-10-CM | POA: Diagnosis not present

## 2020-09-03 DIAGNOSIS — I2510 Atherosclerotic heart disease of native coronary artery without angina pectoris: Secondary | ICD-10-CM | POA: Diagnosis present

## 2020-09-03 DIAGNOSIS — M109 Gout, unspecified: Secondary | ICD-10-CM | POA: Diagnosis present

## 2020-09-03 DIAGNOSIS — E1151 Type 2 diabetes mellitus with diabetic peripheral angiopathy without gangrene: Secondary | ICD-10-CM | POA: Diagnosis not present

## 2020-09-03 DIAGNOSIS — S92321A Displaced fracture of second metatarsal bone, right foot, initial encounter for closed fracture: Secondary | ICD-10-CM | POA: Diagnosis not present

## 2020-09-03 DIAGNOSIS — I509 Heart failure, unspecified: Secondary | ICD-10-CM | POA: Diagnosis not present

## 2020-09-03 DIAGNOSIS — Z87442 Personal history of urinary calculi: Secondary | ICD-10-CM | POA: Diagnosis not present

## 2020-09-03 DIAGNOSIS — E1122 Type 2 diabetes mellitus with diabetic chronic kidney disease: Secondary | ICD-10-CM | POA: Diagnosis not present

## 2020-09-03 DIAGNOSIS — E039 Hypothyroidism, unspecified: Secondary | ICD-10-CM | POA: Diagnosis not present

## 2020-09-03 DIAGNOSIS — Z85828 Personal history of other malignant neoplasm of skin: Secondary | ICD-10-CM | POA: Diagnosis not present

## 2020-09-03 DIAGNOSIS — S91311A Laceration without foreign body, right foot, initial encounter: Secondary | ICD-10-CM | POA: Diagnosis not present

## 2020-09-03 DIAGNOSIS — Z951 Presence of aortocoronary bypass graft: Secondary | ICD-10-CM | POA: Diagnosis not present

## 2020-09-03 DIAGNOSIS — I130 Hypertensive heart and chronic kidney disease with heart failure and stage 1 through stage 4 chronic kidney disease, or unspecified chronic kidney disease: Secondary | ICD-10-CM | POA: Diagnosis not present

## 2020-09-03 DIAGNOSIS — Z8546 Personal history of malignant neoplasm of prostate: Secondary | ICD-10-CM | POA: Diagnosis not present

## 2020-09-03 DIAGNOSIS — Z9861 Coronary angioplasty status: Secondary | ICD-10-CM | POA: Diagnosis not present

## 2020-09-03 DIAGNOSIS — F419 Anxiety disorder, unspecified: Secondary | ICD-10-CM | POA: Diagnosis not present

## 2020-09-03 DIAGNOSIS — D841 Defects in the complement system: Secondary | ICD-10-CM | POA: Diagnosis not present

## 2020-09-03 DIAGNOSIS — K219 Gastro-esophageal reflux disease without esophagitis: Secondary | ICD-10-CM | POA: Diagnosis present

## 2020-09-03 LAB — GLUCOSE, CAPILLARY
Glucose-Capillary: 120 mg/dL — ABNORMAL HIGH (ref 70–99)
Glucose-Capillary: 204 mg/dL — ABNORMAL HIGH (ref 70–99)
Glucose-Capillary: 178 mg/dL — ABNORMAL HIGH (ref 70–99)
Glucose-Capillary: 122 mg/dL — ABNORMAL HIGH (ref 70–99)

## 2020-09-03 MED ORDER — IBUPROFEN 800 MG PO TABS: 800.0000 mg | ORAL_TABLET | Freq: Three times a day (TID) | ORAL | 0 refills | Status: DC | PRN

## 2020-09-03 MED ORDER — DOCUSATE SODIUM 100 MG PO CAPS: 100.0000 mg | ORAL_CAPSULE | Freq: Two times a day (BID) | ORAL | 0 refills | Status: DC

## 2020-09-03 MED ORDER — TRAMADOL HCL 50 MG PO TABS: 50.0000 mg | ORAL_TABLET | ORAL | 0 refills | Status: AC

## 2020-09-03 MED ORDER — ICATIBANT ACETATE 30 MG/3ML ~~LOC~~ SOLN
30.0000 mg | Freq: Once | SUBCUTANEOUS | Status: DC | PRN
  Administered 2020-09-03: 30 mg via SUBCUTANEOUS

## 2020-09-03 MED ORDER — SENNA 8.6 MG PO TABS: 2.0000 | ORAL_TABLET | Freq: Two times a day (BID) | ORAL | 0 refills | Status: DC

## 2020-09-03 NOTE — Discharge Summary (Addendum)
Physician Discharge Summary  Patient ID: Daniel Herrera MRN: [id number] DOB/AGE: 82/23/1942 82 y.o.  Admit date: 09/02/2020 Discharge date: 09/04/2020  Admission Diagnoses:  Open Right 2nd/3rd metatarsal fxs; HTN; hx of abnormal EKG; dyslipidemia; hx of NSTEMI; palpitations; colitis; Hx of prostate CA; chronic diarrhea; CAD S/P angioplasty; DM type II; hx of GI bleed; A-fib; tachycardia; diverticulitis, you to carry a, thyroid disease, skin cancer, prostate cancer, MI, history of C. Difficile, gout, GERD, glossitis, anxiety, cataracts,congestive heart failure, interstitial lung disease, and peripheral vascular disease.  Discharge Diagnoses:  Active Problems:   Open metatarsal fracture same as above  Discharged Condition: stable  Hospital Course:  Patient presented to [facility] ER on 09-02-2020 after a trailer tongue landed on his right foot.  Plain films were obtained and he was diagnosed with open right second and third metatarsal fractures.  Dr. Wylene Simmer was consulted.  The patient was urgently taken to the OR for irrigation and debridement and ORIF of right second and third metatarsal fractures.  He tolerated the procedure well that any complications.  He was admitted to the hospital for 24 hours of IV Ancef. He has a PMH significant for hereditary angioedema.  The hospital does not carry his Icantibant medication for angioedema.  He began to experience an episode and informed the nurse.  Verbal order was given for patient to utilize his home medication.  Rapid response was called in case of progression of symptoms.  The patient's symptoms rapidly improved after administering his Icantibant injection.  Otherwise he tolerated his stay well.  He worked well with therapy.  He is to be discharged home on 09-04-2020.  He tolerated his stay in the hospital well without any complications.  Consults: PT/OT  Significant Diagnostic Studies: radiology: X-Ray: for diagnostic purposes, as well as to ensure  satisfactory anatomic alignment during operative procedure.  Treatments: IV hydration, antibiotics: Ancef, analgesia: Ibuprofen, Diabetes meds: NovoLog cardiac meds: nitroglycerin and metoprolol and surgery: as stated above  Discharge Exam: Blood pressure (!) 185/86, pulse 71, temperature 97.8 F (36.6 C), temperature source Oral, resp. rate 16, height 6\' 1"  (1.854 m), weight 88 kg, SpO2 100 %. General: WDWN patient in NAD. Psych:  Appropriate mood and affect. Neuro:  A&O x 3, Moving all extremities, sensation intact to light touch HEENT:  EOMs intact Chest:  Even non-labored respirations Skin: SLS C/D/I, no rashes or lesions Extremities: warm/dry, no visible edema, erythema or echymosis.  No lymphadenopathy. Pulses: Popliteus 2+ MSK:  ROM: EHL/FHL intact, MMT: able to perform quad set   Disposition: Discharge disposition: 01-Home or Self Care       Discharge Instructions    Call MD / Call 911   Complete by: As directed    If you experience chest pain or shortness of breath, CALL 911 and be transported to the hospital emergency room.  If you develope a fever above 101 F, pus (white drainage) or increased drainage or redness at the wound, or calf pain, call your surgeon's office.   Constipation Prevention   Complete by: As directed    Drink plenty of fluids.  Prune juice may be helpful.  You may use a stool softener, such as Colace (over the counter) 100 mg twice a day.  Use MiraLax (over the counter) for constipation as needed.   Diet - low sodium heart healthy   Complete by: As directed    Increase activity slowly as tolerated   Complete by: As directed    Non weight bearing  Complete by: As directed    Laterality: right   Extremity: Lower   Post-operative opioid taper instructions:   Complete by: As directed    POST-OPERATIVE OPIOID TAPER INSTRUCTIONS: It is important to wean off of your opioid medication as soon as possible. If you do not need pain medication after your  surgery it is ok to stop day one. Opioids include: Codeine, Hydrocodone(Norco, Vicodin), Oxycodone(Percocet, oxycontin) and hydromorphone amongst others.  Long term and even short term use of opiods can cause: Increased pain response Dependence Constipation Depression Respiratory depression And more.  Withdrawal symptoms can include Flu like symptoms Nausea, vomiting And more Techniques to manage these symptoms Hydrate well Eat regular healthy meals Stay active Use relaxation techniques(deep breathing, meditating, yoga) Do Not substitute Alcohol to help with tapering If you have been on opioids for less than two weeks and do not have pain than it is ok to stop all together.  Plan to wean off of opioids This plan should start within one week post op of your joint replacement. Maintain the same interval or time between taking each dose and first decrease the dose.  Cut the total daily intake of opioids by one tablet each day Next start to increase the time between doses. The last dose that should be eliminated is the evening dose.        Allergies as of 09/03/2020      Reactions   Lisinopril Anaphylaxis, Swelling, Other (See Comments)   Face, lips, and throat swell   Oxycodone Other (See Comments)   Hallucination   Tape Other (See Comments)   PLASTIC TAPE TEARS OFF THE SKIN- Please use an alternative!!   Benadryl [diphenhydramine Hcl] Swelling, Other (See Comments)   Facial swelling from high dose (tolerates Advil PM as needed)   Darvocet [propoxyphene N-acetaminophen] Other (See Comments)   Hallucinations   Darvon [propoxyphene Hcl] Other (See Comments)   Hallucination   Tylenol [acetaminophen] Swelling, Other (See Comments)   Foot swelling      Medication List    TAKE these medications   aspirin EC 81 MG tablet Take 81 mg by mouth daily. Swallow whole.   cephALEXin 500 MG capsule Commonly known as: Keflex Take 1 capsule (500 mg total) by mouth 4 (four) times  daily.   dextromethorphan-guaiFENesin 30-600 MG 12hr tablet Commonly known as: MUCINEX DM Take 1 tablet by mouth in the morning and at bedtime.   docusate sodium 100 MG capsule Commonly known as: Colace Take 1 capsule (100 mg total) by mouth 2 (two) times daily. While taking narcotic pain medicine.   EPINEPHrine 0.3 mg/0.3 mL Soaj injection Commonly known as: EPI-PEN Inject 0.3 mg into the muscle as needed for anaphylaxis.   ezetimibe 10 MG tablet Commonly known as: ZETIA Take 1 tablet (10 mg total) by mouth daily.   fluoruracil 0.5 % cream Commonly known as: CARAC Apply 1 application topically as needed for rash. For skin cancer flare up   fluticasone 50 MCG/ACT nasal spray Commonly known as: FLONASE Place 1 spray into both nostrils 2 (two) times daily.   ibuprofen 800 MG tablet Commonly known as: ADVIL Take 1 tablet (800 mg total) by mouth every 8 (eight) hours as needed.   icatibant 30 MG/3ML injection Commonly known as: FIRAZYR Inject 3 mLs (30 mg total) into the skin once as needed (for sudden attacks of hereditary angioedema).   levothyroxine 100 MCG tablet Commonly known as: SYNTHROID Take 100 mcg by mouth daily before breakfast.  lidocaine 2 % solution Commonly known as: XYLOCAINE Use as directed 15 mLs in the mouth or throat daily.   Livalo 4 MG Tabs Generic drug: Pitavastatin Calcium Take by mouth at bedtime.   metoprolol tartrate 25 MG tablet Commonly known as: LOPRESSOR Take 0.5 tablets (12.5 mg total) by mouth 2 (two) times daily.   montelukast 10 MG tablet Commonly known as: SINGULAIR Take 1 tablet (10 mg total) by mouth at bedtime.   nabumetone 750 MG tablet Commonly known as: RELAFEN Take 750 mg by mouth 2 (two) times daily.   nitroGLYCERIN 0.4 MG SL tablet Commonly known as: NITROSTAT Place 0.4 mg under the tongue every 5 (five) minutes as needed for chest pain.   NONFORMULARY OR COMPOUNDED ITEM Use as directed 5-15 mLs in the mouth or  throat See admin instructions. Compounded mouth wash/gargle:1/1/1/1/1 (80 ml's of each) Purified water; Lidocaine Viscous; Nystatin suspension; Prednisolone 15/5; Maalox: Swish and spit using 5-15 ml's every 6 hours as needed for irritation   OVER THE COUNTER MEDICATION Take 1 capsule by mouth in the morning and at bedtime. Health Plus/Super Colon Cleanse   pantoprazole 40 MG tablet Commonly known as: PROTONIX Take 40 mg by mouth 2 (two) times daily.   phenazopyridine 200 MG tablet Commonly known as: Pyridium Take 1 tablet (200 mg total) by mouth 3 (three) times daily as needed (burning with urination).   PROBIOTIC DAILY PO Take 1 capsule by mouth daily.   senna 8.6 MG Tabs tablet Commonly known as: SENOKOT Take 2 tablets (17.2 mg total) by mouth 2 (two) times daily.   Takhzyro 300 MG/2ML Soln Generic drug: Lanadelumab-flyo Inject 300 mg into the muscle every 14 (fourteen) days.   traMADol 50 MG tablet Commonly known as: Ultram Take 1 tablet (50 mg total) by mouth every 4 (four) hours for 5 days.   Vitamin B-12 5000 MCG Tbdp Take 5,000 mcg by mouth daily.            Discharge Care Instructions  (From admission, onward)         Start     Ordered   09/03/20 0000  Non weight bearing       Question Answer Comment  Laterality right   Extremity Lower      09/03/20 1612          Follow-up Information    Wylene Simmer, MD. Schedule an appointment as soon as possible for a visit in 2 week(s).   Specialty: Orthopedic Surgery Contact information: [address] [place] [address] [phone]               Signed: Mohammed Kindle Office:  [phone]

## 2020-09-03 NOTE — Progress Notes (Signed)
Received patient from PACU awake,alert/orientedx4 and able to verbalize needs. VSS; NAD noted; respirations easy/even on room air. Movement/sensation to all extremities noted. Dressing to RLE c/d/i sensation intact and able to wiggle toes. SCD on to LLE. Whiteboard updated. All safety measures in place and personal belongings within reach.

## 2020-09-03 NOTE — Discharge Instructions (Signed)
Daniel Simmer, MD [facility]  Please read the following information regarding your care after surgery.  Medications  You only need a prescription for the narcotic pain medicine (ex. oxycodone, Percocet, Norco).  All of the other medicines listed below are available over the counter. X Ibuprofen as prescribed X Tramadol as prescribed for severe pain  Narcotic pain medicine (ex. oxycodone, Percocet, Vicodin) will cause constipation.  To prevent this problem, take the following medicines while you are taking any pain medicine. X docusate sodium (Colace) 100 mg twice a day X senna (Senokot) 2 tablets twice a day  X To help prevent blood clots, resume your baby Aspirin once daily after surgery.  You should also get up every hour while you are awake to move around.    Weight Bearing X Do not bear any weight on the operated leg or foot.  Cast / Splint / Dressing X Keep your splint, cast or dressing clean and dry.  Don't put anything (coat hanger, pencil, etc) down inside of it.  If it gets damp, use a hair dryer on the cool setting to dry it.  If it gets soaked, call the office to schedule an appointment for a cast change.   After your dressing, cast or splint is removed; you may shower, but do not soak or scrub the wound.  Allow the water to run over it, and then gently pat it dry.  Swelling It is normal for you to have swelling where you had surgery.  To reduce swelling and pain, keep your toes above your nose for at least 3 days after surgery.  It may be necessary to keep your foot or leg elevated for several weeks.  If it hurts, it should be elevated.  Follow Up Call my office at [phone] when you are discharged from the hospital or surgery center to schedule an appointment to be seen two weeks after surgery.  Call my office at [phone] if you develop a fever >101.5 F, nausea, vomiting, bleeding from the surgical site or severe pain.

## 2020-09-03 NOTE — Plan of Care (Signed)
  Problem: Pain Managment: Goal: General experience of comfort will improve Outcome: Progressing   

## 2020-09-03 NOTE — Progress Notes (Signed)
Called to room by pt complaining of angioedema. Pt stated he has chronic episodes of angioedema and brought his home medications. Per pharmacy, pt's home medication is not offered at the hospital. Rapid response and AD notified. Vital signs taken. Mechele Claude notified, verbal order for pt to self administer home medication given. Pt self administered medication, and medication scanned in MAR. Pt will ask family member to bring another injection to have while in the hospital. Following injection pt stated symptoms subsided. Will continue to monitor.

## 2020-09-03 NOTE — Significant Event (Signed)
Rapid Response Event Note   Reason for Call :  Patient complaint of angioedema  Initial Focused Assessment:  Per Patient he has "hereditary angioedema".  His lips and throat are a little swollen and red.  He is mildly anxious He has a hoarse voice but that is his baseline per patient.  No stridor or wheezes.  Clear lung sounds  185/86  HR 71  RR 16  O2 sat 100% on RA  Interventions:  RN spoke with ortho PA and received orders to administer patient's home medication.  Icatibant given sq in patient's abdomen.  Plan of Care:  Continuous pulse ox RN to reassess patient Patient to notify RN if symptoms worsen or don't improve   Event Summary:   MD Notified: Mechele Claude PA Call Time: Waves Time: 4239 End Time: Shorter  Raliegh Ip, RN

## 2020-09-03 NOTE — TOC Initial Note (Signed)
Transition of Care [facility]) - Initial/Assessment Note    Patient Details  Name: Daniel Herrera MRN: [id number] Date of Birth: 1940-07-13  Transition of [facility] CM/SW Contact:    Sharin Mons, RN Phone Number: 09/03/2020, 2:01 PM  Clinical Narrative:               - s/p I &D R 2ND AND 3RD METATARSAL ,ORIF R  OPEN 2ND AND 3RD METATARSAL (TOE) OPEN FRACTURE, 6/2  From home with wife. PTA independent with ADL's NCM spoke with pt in regard to d/c planning. PT evaluation pending. Pt states agreeable to home health services if needed. Choice of agency offered. Pt without preference. Referral made with [facility] and accepted  pending MD 's orders.  Pt already has W/C, rolling walker , BSC @ home.  Pt states has no problems affording Rx meds.  Pt with transportation to home.  TOC will continue to monitor for TOC needs....  Expected Discharge Plan: Home/Self Care (vs home with homehealth services) Barriers to Discharge: Continued Medical Work up (PT evaluation pending)   Patient Goals and CMS Choice     Choice offered to / list presented to : Patient  Expected Discharge Plan and Services Expected Discharge Plan: Home/Self Care (vs home with homehealth services)   Discharge Planning Services: CM Consult Post Acute Care Choice: [place] arrangements for the past 2 months: Single Family Home                           HH Arranged: PT HH Agency: [facility] Date [place]: 09/03/20 Time HH Agency Contacted: 1400 Representative spoke with at [place]: Otila Kluver  Prior Living Arrangements/Services Living arrangements for the past 2 months: [place] Lives with:: Spouse Patient language and need for interpreter reviewed:: Yes Do you feel safe going back to the place where you live?: Yes      Need for Family Participation in Patient Care: Yes (Comment) Care giver support system in place?: Yes (comment) Current home services:  DME (already has W/C, rolling walker and BSC) Criminal Activity/Legal Involvement Pertinent to Current Situation/Hospitalization: No - Comment as needed  Activities of Daily Living Home Assistive Devices/Equipment: None ADL Screening (condition at time of admission) Patient's cognitive ability adequate to safely complete daily activities?: Yes Is the patient deaf or have difficulty hearing?: Yes Does the patient have difficulty seeing, even when wearing glasses/contacts?: No Does the patient have difficulty concentrating, remembering, or making decisions?: Yes Patient able to express need for assistance with ADLs?: Yes Does the patient have difficulty dressing or bathing?: Yes Independently performs ADLs?: Yes (appropriate for developmental age) Does the patient have difficulty walking or climbing stairs?: No Weakness of Legs: None Weakness of Arms/Hands: None  Permission Sought/Granted                  Emotional Assessment Appearance:: Appears stated age Attitude/Demeanor/Rapport: Engaged Affect (typically observed): Hopeful Orientation: : Oriented to Self,Oriented to Place,Oriented to  Time,Oriented to Situation Alcohol / Substance Use: Not Applicable Psych Involvement: No (comment)  Admission diagnosis:  Open metatarsal fracture [S92.309B] Patient Active Problem List   Diagnosis Date Noted  . Open metatarsal fracture 09/02/2020  . Dysrhythmia   . Geographic tongue   . History of kidney stones   . Hypothyroidism   . Interstitial lung disease ([place])   . Peripheral vascular disease ([place])   . Pre-diabetes   . Urticaria   .  Thyroid disease   . Skin cancer   . Prostate enlargement   . Prostate cancer ([place])   . Personal history of digestive disease   . Myocardial infarction ([place])   . Hypertension   . Hx of umbilical hernia repair   . Hx of Clostridium difficile infection   . History of prolonged Q-T interval on ECG   . High cholesterol   . Gout   . GERD  (gastroesophageal reflux disease)   . Arthritis   . Anxiety   . Angio-edema   . Cataract   . Diverticular hemorrhage   . Lower GI bleed 06/03/2019  . Acute lower GI bleeding 05/28/2019  . CAD (coronary artery disease) 05/28/2019  . Diverticulosis   . Glossitis, benign migratory 12/04/2018  . Hoarseness 12/04/2018  . PAF (paroxysmal atrial fibrillation) ([place]) 06/13/2018  . S/P CABG x 3 05/28/2018  . NSTEMI (non-ST elevated myocardial infarction) ([place]) 05/21/2018  . Tachycardia determined by examination of pulse   . CHF (congestive heart failure) ([place]) 2020  . Pre-operative clearance 01/02/2018  . Hematochezia 12/21/2017  . CKD (chronic kidney disease) stage 3, GFR 30-59 ml/min 12/21/2017  . GI bleed 12/21/2017  . Hereditary angioedema type 2 ([place]) 07/28/2017  . Enterotoxigenic Escherichia coli infection 05/24/2015  . STEC (Shiga toxin-producing Escherichia coli) infection 05/24/2015  . Colitis 05/23/2015  . History of prostate cancer   . Chronic diarrhea of unknown origin   . CAD S/P percutaneous coronary angioplasty   . Type 2 diabetes mellitus with unspecified complications ([place])   . Palpitations 03/15/2015  . History of non-ST elevation myocardial infarction (NSTEMI) 02/22/2015  . Abnormal EKG 02/21/2015  . Essential hypertension 02/21/2015  . Dyslipidemia, goal LDL below 70 02/21/2015  . Malignant neoplasm of prostate ([place]) 08/07/2014  . Anginal pain ([place]) 2016  . ILD (interstitial lung disease) ([place]) 12/17/2013   PCP:  [name], NP Pharmacy:   [id number] - [place], [place] [address] Phone: [phone] Fax: [place], [place] - [address] [place] [address] Phone: [phone] Fax: [phone]     Social Determinants of Health (SDOH) Interventions    Readmission Risk Interventions No  flowsheet data found.

## 2020-09-03 NOTE — Plan of Care (Signed)

## 2020-09-03 NOTE — TOC CAGE-AID Note (Signed)
Transition of Care [facility]) - CAGE-AID Screening   Patient Details  Name: Daniel Herrera MRN: [id number] Date of Birth: 07-15-1940     Elvina Sidle, RN  Trauma Response Nurse Phone Number:[phone] 09/03/2020, 10:46 AM   Clinical Narrative:    CAGE-AID Screening:    Have You Ever Felt You Ought to Cut Down on Your Drinking or Drug Use?: No Have People Annoyed You By [employer] Your Drinking Or Drug Use?: No Have You Felt Bad Or Guilty About Your Drinking Or Drug Use?: No Have You Ever Had a Drink or Used Drugs First Thing In The Morning to Steady Your Nerves or to Get Rid of a Hangover?: No CAGE-AID Score: 0  Substance Abuse Education Offered: No (States- "I have never drank, smoked or used drugs in my life")

## 2020-09-03 NOTE — Progress Notes (Addendum)
Subjective: 1 Day Post-Op Procedure(s) (LRB): IRRIGATION AND DEBRIDEMENT RIGHT 2ND AND 3RD METATARSAL (Right) OPEN REDUCTION INTERNAL FIXATION (ORIF) OF RIGHT OPEN 2ND AND 3RD METATARSAL (TOE) OPEN FRACTURE (Right)  Patient reports pain as mild to moderate.  Denies fever, chills, N/V, CP, SOB.  Tolerating POs well.  Admits to flatus.  Reports that oxycodone is too strong and makes him hallucinate.  Notes that he is doing well on Ibuprofen.  States that he wants to go home.  Objective:   VITALS:  Temp:  [97.4 F (36.3 C)-98.6 F (37 C)] 97.9 F (36.6 C) (06/03 0627) Pulse Rate:  [60-73] 72 (06/03 0627) Resp:  [6-19] 16 (06/03 0627) BP: (144-188)/(68-110) 155/81 (06/03 0627) SpO2:  [96 %-100 %] 99 % (06/03 0627) Weight:  [88 kg] 88 kg (06/02 1659)  General: WDWN patient in NAD. Psych:  Appropriate mood and affect. Neuro:  A&O x 3, Moving all extremities, sensation intact to light touch HEENT:  EOMs intact Chest:  Even non-labored respirations Skin:  SLS C/D/I, no rashes or lesions Extremities: warm/dry, no visible edema, erythema or echymosis.  No lymphadenopathy. Pulses: Popliteus 2+ MSK:  ROM: EHL/FHL intact, MMT: able to perform quad set    LABS Recent Labs    09/02/20 1527 09/02/20 1656  HGB 13.7 13.9  WBC 6.0  --   PLT 161  --    Recent Labs    09/02/20 1656  NA 142  K 3.8  CL 107  BUN 27*  CREATININE 1.40*  GLUCOSE 109*   No results for input(s): LABPT, INR in the last 72 hours.   Assessment/Plan: 1 Day Post-Op Procedure(s) (LRB): IRRIGATION AND DEBRIDEMENT RIGHT 2ND AND 3RD METATARSAL (Right) OPEN REDUCTION INTERNAL FIXATION (ORIF) OF RIGHT OPEN 2ND AND 3RD METATARSAL (TOE) OPEN FRACTURE (Right)  NWB R LE Up with therapy Disp: Likely home. Patient will work with therapy today and finish his 24 hrs of IV ABX.  If patient is ok to go home please inform me and I'll place D/C orders. I have sent a Rx for tramadol and Ibuprofen to patient's outpatient  pharmacy. Prior to prescribing the tramadol I reviewed the patient's narcotic medical record in the PMP Aware database. No DVT ppx due to high risk of bleeding. Plan for 2 week outpatient post-op visit.  Mechele Claude PA-C [facility] Office:  [phone]

## 2020-09-04 LAB — GLUCOSE, CAPILLARY: Glucose-Capillary: 124 mg/dL — ABNORMAL HIGH (ref 70–99)

## 2020-09-04 NOTE — Progress Notes (Signed)
Pt discharged to home. DC instructions given with daughter at bedside. No concerns voiced. Pt encouraged to stop by pharmacy and pick up meds that were e-prescribed by Provider. Pt voiced understanding. Pt left unit in wheelchair pushed by nurse tech Jun. Left in stable condition.

## 2020-09-04 NOTE — TOC Transition Note (Signed)
Transition of Care [facility]) - CM/SW Discharge Note   Patient Details  Name: Daniel Herrera MRN: [id number] Date of Birth: Aug 18, 1940  Transition of Care [facility]) CM/SW Contact:  Bartholomew Crews, RN Phone Number: [phone] 09/04/2020, 10:59 AM   Clinical Narrative:     Notified by PT of recommendations for [facility]. [facility] requested[facility] - notified of transition home today. No further TOC needs identified.   Final next level of care: [place] Barriers to Discharge: No Barriers Identified   Patient Goals and CMS Choice Patient states their goals for this hospitalization and ongoing recovery are:: return home CMS Medicare.gov Compare Post Acute Care list provided to:: Patient Choice offered to / list presented to : Patient  Discharge Placement                       Discharge Plan and Services   Discharge Planning Services: CM Consult Post Acute Care Choice: Home Health          DME Arranged: N/A DME Agency: NA       HH Arranged: PT HH [facility] Date [place]: 09/04/20 Time [place]: 8590 Representative spoke with at [place]: [place] ([place]) Interventions     Readmission Risk Interventions No flowsheet data found.

## 2020-09-04 NOTE — Evaluation (Signed)
Physical Therapy Evaluation Patient Details Name: Daniel Herrera MRN: [id number] DOB: 08-08-1940 Today's Date: 09/04/2020   History of Present Illness  80 yo male sustained a laceration of R foot when a part of a hay bailer fell on his foot.  Now dx with R second and third metatarsal fractures with crush injury, NWB after I and D on 6/2.  PMHx:  c-diff, TEE, CABG, cataracts, L heart cath with angiography, lumbr spine surgery, thyroid disease, DM, tachycardia, NSTEMI, HTN, hypothyroid, ILD, prostate and skin CA, PVD, gastric ulcer, unbilical hernia repair, CHF, CAD, GI bleed, CKD, diverticulosis  Clinical Impression  Pt was seen for evaluation of gait including stairs for home, and recommending HHPT for assistance of his recovery.  Has daughter in attendance and wife not there, but also has other nearby family sporadically available.  Follow for acute PT goals if dc is not done today.    Follow Up Recommendations Home health PT;Supervision for mobility/OOB    Equipment Recommendations  Rolling walker with 5" wheels    Recommendations for Other Services       Precautions / Restrictions Precautions Precautions: Fall Precaution Comments: heart pt Restrictions Weight Bearing Restrictions: Yes RLE Weight Bearing: Non weight bearing      Mobility  Bed Mobility Overal bed mobility: Needs Assistance Bed Mobility: Supine to Sit     Supine to sit: Min guard          Transfers Overall transfer level: Needs assistance Equipment used: Rolling walker (2 wheeled);1 person hand held assist Transfers: Sit to/from Stand Sit to Stand: Min assist         General transfer comment: min assist to power up and stand  Ambulation/Gait Ambulation/Gait assistance: Min guard;Min assist Gait Distance (Feet): 30 Feet Assistive device: Rolling walker (2 wheeled);1 person hand held assist   Gait velocity: slow pace Gait velocity interpretation: <1.31 ft/sec, indicative of household  ambulator General Gait Details: hopping onLLE with RLE NWB  Stairs Stairs: Yes Stairs assistance: Min guard;Min assist Stair Management: No rails;Forwards;With walker Number of Stairs: 2 General stair comments: has a surface to set walker to step up  Wheelchair Mobility    Modified Rankin (Stroke Patients Only)       Balance Overall balance assessment: Needs assistance Sitting-balance support: Feet supported Sitting balance-Leahy Scale: Good     Standing balance support: Bilateral upper extremity supported;During functional activity Standing balance-Leahy Scale: Poor                               Pertinent Vitals/Pain Pain Assessment: No/denies pain    Home Living Family/patient expects to be discharged to:: Private residence Living Arrangements: Spouse/significant other Available Help at Discharge: Family;Available PRN/intermittently;Available 24 hours/day Type of Home: House Home Access: Stairs to enter   [employer] of Steps: 2 Home Layout: One level Home Equipment: Environmental consultant - 2 wheels Additional Comments: at DC daughter was asking for a rollator    Prior Function Level of Independence: Independent         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, newspaper   Dominant Hand: Right    Extremity/Trunk Assessment   Upper Extremity Assessment Upper Extremity Assessment: RUE deficits/detail;LUE deficits/detail RUE Deficits / Details: shoulder weakness and previous shld surgery LUE Deficits / Details: shoulder weak and had prev surgery for instability    Lower Extremity Assessment Lower Extremity Assessment: Overall WFL for tasks assessed;Generalized weakness  Cervical / Trunk Assessment Cervical / Trunk Assessment: Kyphotic (mild)  Communication   Communication: HOH  Cognition Arousal/Alertness: Awake/alert Behavior During Therapy: Impulsive Overall Cognitive Status: Within Functional Limits for tasks  assessed                                 General Comments: pt is anxious to leave but unclear if this is baseline      General Comments General comments (skin integrity, edema, etc.): Pt is up to walk with RW and used walker on stairs as well.  Gait belt given to pt for home and daughter in to observe how much a challenge the stair will be for pt.  Pt mainly struggles with stepping down and avoiding touching R heel on chair    Exercises     Assessment/Plan    PT Assessment Patient needs continued PT services  PT Problem List Decreased strength;Decreased range of motion;Decreased activity tolerance;Decreased balance;Decreased mobility;Decreased coordination;Decreased cognition;Decreased knowledge of use of DME;Decreased safety awareness;Decreased knowledge of precautions;Decreased skin integrity;Pain       PT Treatment Interventions DME instruction;Gait training;Stair training;Functional mobility training;Therapeutic activities;Therapeutic exercise;Balance training;Neuromuscular re-education;Patient/family education    PT Goals (Current goals can be found in the Care Plan section)  Acute Rehab PT Goals Patient Stated Goal: to get home soon PT Goal Formulation: With patient Time For Goal Achievement: 09/18/20 Potential to Achieve Goals: Good    Frequency Min 4X/week   Barriers to discharge Inaccessible home environment;Decreased caregiver support home with wife who is going to be limited to assist pt    Co-evaluation               AM-PAC PT "6 Clicks" Mobility  Outcome Measure Help needed turning from your back to your side while in a flat bed without using bedrails?: A Little Help needed moving from lying on your back to sitting on the side of a flat bed without using bedrails?: A Little Help needed moving to and from a bed to a chair (including a wheelchair)?: A Little Help needed standing up from a chair using your arms (e.g., wheelchair or bedside  chair)?: A Little Help needed to walk in hospital room?: A Little Help needed climbing 3-5 steps with a railing? : A Little 6 Click Score: 18    End of Session Equipment Utilized During Treatment: Gait belt Activity Tolerance: Patient limited by fatigue Patient left: in chair;with call bell/phone within reach;with chair alarm set;with family/visitor present Nurse Communication: Mobility status PT Visit Diagnosis: Unsteadiness on feet (R26.81);Muscle weakness (generalized) (M62.81);Pain Pain - Right/Left: Right Pain - part of body: Ankle and joints of foot    Time: 1011-1054 PT Time Calculation (min) (ACUTE ONLY): 43 min   Charges:   PT Evaluation $PT Eval Moderate Complexity: 1 Mod PT Treatments $Gait Training: 8-22 mins $Therapeutic Activity: 8-22 mins       Ramond Dial 09/04/2020, 1:18 PM Mee Hives, PT MS Acute Rehab Dept. Number: [place] [facility] [place]

## 2020-09-04 NOTE — Progress Notes (Signed)
Subjective: 2 Days Post-Op Procedure(s) (LRB): IRRIGATION AND DEBRIDEMENT RIGHT 2ND AND 3RD METATARSAL (Right) OPEN REDUCTION INTERNAL FIXATION (ORIF) OF RIGHT OPEN 2ND AND 3RD METATARSAL (TOE) OPEN FRACTURE (Right) Patient reports pain as mild.    Objective: Vital signs in last 24 hours: Temp:  [97.8 F (36.6 C)-98.5 F (36.9 C)] 98.5 F (36.9 C) (06/04 0524) Pulse Rate:  [70-74] 74 (06/04 0524) Resp:  [16-18] 18 (06/04 0524) BP: (131-185)/(81-89) 161/89 (06/04 0524) SpO2:  [98 %-100 %] 98 % (06/04 0524)  Intake/Output from previous day: 06/03 0701 - 06/04 0700 In: -  Out: 7253 [Urine:1275] Intake/Output this shift: No intake/output data recorded.  Recent Labs    09/02/20 1527 09/02/20 1656  HGB 13.7 13.9   Recent Labs    09/02/20 1527 09/02/20 1656  WBC 6.0  --   RBC 4.25  --   HCT 40.6 41.0  PLT 161  --    Recent Labs    09/02/20 1656  NA 142  K 3.8  CL 107  BUN 27*  CREATININE 1.40*  GLUCOSE 109*   No results for input(s): LABPT, INR in the last 72 hours.  Neurologically intact Neurovascular intact Splint intact. No drainage on splint. Toes pink. Wiggles toes   Assessment/Plan: 2 Days Post-Op Procedure(s) (LRB): IRRIGATION AND DEBRIDEMENT RIGHT 2ND AND 3RD METATARSAL (Right) OPEN REDUCTION INTERNAL FIXATION (ORIF) OF RIGHT OPEN 2ND AND 3RD METATARSAL (TOE) OPEN FRACTURE (Right)  Discharge home today  Daniel Herrera 09/04/2020, 8:30 AM

## 2020-09-05 DIAGNOSIS — E1122 Type 2 diabetes mellitus with diabetic chronic kidney disease: Secondary | ICD-10-CM | POA: Diagnosis not present

## 2020-09-05 DIAGNOSIS — I509 Heart failure, unspecified: Secondary | ICD-10-CM | POA: Diagnosis not present

## 2020-09-05 DIAGNOSIS — N183 Chronic kidney disease, stage 3 unspecified: Secondary | ICD-10-CM | POA: Diagnosis not present

## 2020-09-05 DIAGNOSIS — Z951 Presence of aortocoronary bypass graft: Secondary | ICD-10-CM | POA: Diagnosis not present

## 2020-09-05 DIAGNOSIS — M109 Gout, unspecified: Secondary | ICD-10-CM | POA: Diagnosis not present

## 2020-09-05 DIAGNOSIS — Z79891 Long term (current) use of opiate analgesic: Secondary | ICD-10-CM | POA: Diagnosis not present

## 2020-09-05 DIAGNOSIS — S92331B Displaced fracture of third metatarsal bone, right foot, initial encounter for open fracture: Secondary | ICD-10-CM | POA: Diagnosis not present

## 2020-09-05 DIAGNOSIS — K5790 Diverticulosis of intestine, part unspecified, without perforation or abscess without bleeding: Secondary | ICD-10-CM | POA: Diagnosis not present

## 2020-09-05 DIAGNOSIS — Z7951 Long term (current) use of inhaled steroids: Secondary | ICD-10-CM | POA: Diagnosis not present

## 2020-09-05 DIAGNOSIS — K219 Gastro-esophageal reflux disease without esophagitis: Secondary | ICD-10-CM | POA: Diagnosis not present

## 2020-09-05 DIAGNOSIS — Z7982 Long term (current) use of aspirin: Secondary | ICD-10-CM | POA: Diagnosis not present

## 2020-09-05 DIAGNOSIS — I130 Hypertensive heart and chronic kidney disease with heart failure and stage 1 through stage 4 chronic kidney disease, or unspecified chronic kidney disease: Secondary | ICD-10-CM | POA: Diagnosis not present

## 2020-09-05 DIAGNOSIS — I252 Old myocardial infarction: Secondary | ICD-10-CM | POA: Diagnosis not present

## 2020-09-05 DIAGNOSIS — E039 Hypothyroidism, unspecified: Secondary | ICD-10-CM | POA: Diagnosis not present

## 2020-09-05 DIAGNOSIS — I2510 Atherosclerotic heart disease of native coronary artery without angina pectoris: Secondary | ICD-10-CM | POA: Diagnosis not present

## 2020-09-05 DIAGNOSIS — E785 Hyperlipidemia, unspecified: Secondary | ICD-10-CM | POA: Diagnosis not present

## 2020-09-05 DIAGNOSIS — F419 Anxiety disorder, unspecified: Secondary | ICD-10-CM | POA: Diagnosis not present

## 2020-09-05 DIAGNOSIS — C61 Malignant neoplasm of prostate: Secondary | ICD-10-CM | POA: Diagnosis not present

## 2020-09-05 DIAGNOSIS — E1151 Type 2 diabetes mellitus with diabetic peripheral angiopathy without gangrene: Secondary | ICD-10-CM | POA: Diagnosis not present

## 2020-09-05 DIAGNOSIS — Z981 Arthrodesis status: Secondary | ICD-10-CM | POA: Diagnosis not present

## 2020-09-05 DIAGNOSIS — Z85828 Personal history of other malignant neoplasm of skin: Secondary | ICD-10-CM | POA: Diagnosis not present

## 2020-09-05 DIAGNOSIS — I480 Paroxysmal atrial fibrillation: Secondary | ICD-10-CM | POA: Diagnosis not present

## 2020-09-05 DIAGNOSIS — M1990 Unspecified osteoarthritis, unspecified site: Secondary | ICD-10-CM | POA: Diagnosis not present

## 2020-09-05 DIAGNOSIS — S92321B Displaced fracture of second metatarsal bone, right foot, initial encounter for open fracture: Secondary | ICD-10-CM | POA: Diagnosis not present

## 2020-09-10 DIAGNOSIS — S92331B Displaced fracture of third metatarsal bone, right foot, initial encounter for open fracture: Secondary | ICD-10-CM | POA: Diagnosis not present

## 2020-09-10 DIAGNOSIS — E1122 Type 2 diabetes mellitus with diabetic chronic kidney disease: Secondary | ICD-10-CM | POA: Diagnosis not present

## 2020-09-10 DIAGNOSIS — I509 Heart failure, unspecified: Secondary | ICD-10-CM | POA: Diagnosis not present

## 2020-09-10 DIAGNOSIS — S92321B Displaced fracture of second metatarsal bone, right foot, initial encounter for open fracture: Secondary | ICD-10-CM | POA: Diagnosis not present

## 2020-09-10 DIAGNOSIS — N183 Chronic kidney disease, stage 3 unspecified: Secondary | ICD-10-CM | POA: Diagnosis not present

## 2020-09-10 DIAGNOSIS — I130 Hypertensive heart and chronic kidney disease with heart failure and stage 1 through stage 4 chronic kidney disease, or unspecified chronic kidney disease: Secondary | ICD-10-CM | POA: Diagnosis not present

## 2020-09-15 ENCOUNTER — Telehealth: Payer: Self-pay | Admitting: Pulmonary Disease

## 2020-09-15 ENCOUNTER — Encounter: Payer: Self-pay | Admitting: Pulmonary Disease

## 2020-09-15 NOTE — Telephone Encounter (Signed)
I called the pt and he stated that he has been taking the mucinex dm and he feels that this has not been helping.  He is wanting to know if RA has something else that he can try.  He also has tried Robitussin DM.  He stated that he had open heart surgery 3 years ago.  RA please advise. Thanks   Allergies  Allergen Reactions   Lisinopril Anaphylaxis, Swelling and Other (See Comments)    Face, lips, and throat swell   Oxycodone Other (See Comments)    Hallucination   Tape Other (See Comments)    PLASTIC TAPE TEARS OFF THE SKIN- Please use an alternative!!   Benadryl [Diphenhydramine Hcl] Swelling and Other (See Comments)    Facial swelling from high dose (tolerates Advil PM as needed)   Darvocet [Propoxyphene N-Acetaminophen] Other (See Comments)    Hallucinations    Darvon [Propoxyphene Hcl] Other (See Comments)    Hallucination    Tylenol [Acetaminophen] Swelling and Other (See Comments)    Foot swelling

## 2020-09-17 NOTE — Telephone Encounter (Signed)
Left message for patient to call back  

## 2020-09-20 DIAGNOSIS — H349 Unspecified retinal vascular occlusion: Secondary | ICD-10-CM | POA: Diagnosis not present

## 2020-09-20 DIAGNOSIS — H524 Presbyopia: Secondary | ICD-10-CM | POA: Diagnosis not present

## 2020-09-20 DIAGNOSIS — Z961 Presence of intraocular lens: Secondary | ICD-10-CM | POA: Diagnosis not present

## 2020-09-21 DIAGNOSIS — N183 Chronic kidney disease, stage 3 unspecified: Secondary | ICD-10-CM | POA: Diagnosis not present

## 2020-09-21 DIAGNOSIS — I509 Heart failure, unspecified: Secondary | ICD-10-CM | POA: Diagnosis not present

## 2020-09-21 DIAGNOSIS — I130 Hypertensive heart and chronic kidney disease with heart failure and stage 1 through stage 4 chronic kidney disease, or unspecified chronic kidney disease: Secondary | ICD-10-CM | POA: Diagnosis not present

## 2020-09-21 DIAGNOSIS — E1122 Type 2 diabetes mellitus with diabetic chronic kidney disease: Secondary | ICD-10-CM | POA: Diagnosis not present

## 2020-09-21 DIAGNOSIS — S92331B Displaced fracture of third metatarsal bone, right foot, initial encounter for open fracture: Secondary | ICD-10-CM | POA: Diagnosis not present

## 2020-09-21 DIAGNOSIS — S92321B Displaced fracture of second metatarsal bone, right foot, initial encounter for open fracture: Secondary | ICD-10-CM | POA: Diagnosis not present

## 2020-09-21 NOTE — Telephone Encounter (Signed)
Called and spoke to pt. Pt states he thought he already had an appt for his HRCT. However, there isnt an appt in pt's chart indicating this. Pt states he will call and see what the appt he has is for then call us back to schedule the HRCT. Pt also states his cough has improved and he just has some congestion but is taking mucinex which he thinks is helping. Will await pt's return call to schedule HRCT.

## 2020-09-22 DIAGNOSIS — Z4789 Encounter for other orthopedic aftercare: Secondary | ICD-10-CM | POA: Diagnosis not present

## 2020-09-23 NOTE — Telephone Encounter (Signed)
Called and spoke to pt. Pt states he has been scheduled for his CT on 7/29 at [facility]. Nothing further needed at this time.

## 2020-09-29 DIAGNOSIS — J479 Bronchiectasis, uncomplicated: Secondary | ICD-10-CM | POA: Diagnosis not present

## 2020-09-29 DIAGNOSIS — J849 Interstitial pulmonary disease, unspecified: Secondary | ICD-10-CM | POA: Diagnosis not present

## 2020-09-29 DIAGNOSIS — S92321B Displaced fracture of second metatarsal bone, right foot, initial encounter for open fracture: Secondary | ICD-10-CM | POA: Diagnosis not present

## 2020-09-29 DIAGNOSIS — I700 Atherosclerosis of aorta: Secondary | ICD-10-CM | POA: Diagnosis not present

## 2020-09-29 DIAGNOSIS — M79671 Pain in right foot: Secondary | ICD-10-CM | POA: Diagnosis not present

## 2020-09-29 DIAGNOSIS — S92331B Displaced fracture of third metatarsal bone, right foot, initial encounter for open fracture: Secondary | ICD-10-CM | POA: Diagnosis not present

## 2020-09-29 DIAGNOSIS — R310 Gross hematuria: Secondary | ICD-10-CM | POA: Diagnosis not present

## 2020-09-29 DIAGNOSIS — C61 Malignant neoplasm of prostate: Secondary | ICD-10-CM | POA: Diagnosis not present

## 2020-09-29 DIAGNOSIS — N210 Calculus in bladder: Secondary | ICD-10-CM | POA: Diagnosis not present

## 2020-09-29 DIAGNOSIS — Z4889 Encounter for other specified surgical aftercare: Secondary | ICD-10-CM | POA: Diagnosis not present

## 2020-10-05 ENCOUNTER — Ambulatory Visit: Payer: Medicare Other | Admitting: Cardiology

## 2020-10-05 DIAGNOSIS — Z79891 Long term (current) use of opiate analgesic: Secondary | ICD-10-CM | POA: Diagnosis not present

## 2020-10-05 DIAGNOSIS — Z981 Arthrodesis status: Secondary | ICD-10-CM | POA: Diagnosis not present

## 2020-10-05 DIAGNOSIS — M109 Gout, unspecified: Secondary | ICD-10-CM | POA: Diagnosis not present

## 2020-10-05 DIAGNOSIS — I130 Hypertensive heart and chronic kidney disease with heart failure and stage 1 through stage 4 chronic kidney disease, or unspecified chronic kidney disease: Secondary | ICD-10-CM | POA: Diagnosis not present

## 2020-10-05 DIAGNOSIS — E785 Hyperlipidemia, unspecified: Secondary | ICD-10-CM | POA: Diagnosis not present

## 2020-10-05 DIAGNOSIS — I2510 Atherosclerotic heart disease of native coronary artery without angina pectoris: Secondary | ICD-10-CM | POA: Diagnosis not present

## 2020-10-05 DIAGNOSIS — Z1331 Encounter for screening for depression: Secondary | ICD-10-CM | POA: Diagnosis not present

## 2020-10-05 DIAGNOSIS — S92331B Displaced fracture of third metatarsal bone, right foot, initial encounter for open fracture: Secondary | ICD-10-CM | POA: Diagnosis not present

## 2020-10-05 DIAGNOSIS — I509 Heart failure, unspecified: Secondary | ICD-10-CM | POA: Diagnosis not present

## 2020-10-05 DIAGNOSIS — E039 Hypothyroidism, unspecified: Secondary | ICD-10-CM | POA: Diagnosis not present

## 2020-10-05 DIAGNOSIS — M1990 Unspecified osteoarthritis, unspecified site: Secondary | ICD-10-CM | POA: Diagnosis not present

## 2020-10-05 DIAGNOSIS — I252 Old myocardial infarction: Secondary | ICD-10-CM | POA: Diagnosis not present

## 2020-10-05 DIAGNOSIS — E1122 Type 2 diabetes mellitus with diabetic chronic kidney disease: Secondary | ICD-10-CM | POA: Diagnosis not present

## 2020-10-05 DIAGNOSIS — Z7982 Long term (current) use of aspirin: Secondary | ICD-10-CM | POA: Diagnosis not present

## 2020-10-05 DIAGNOSIS — Z85828 Personal history of other malignant neoplasm of skin: Secondary | ICD-10-CM | POA: Diagnosis not present

## 2020-10-05 DIAGNOSIS — S92321B Displaced fracture of second metatarsal bone, right foot, initial encounter for open fracture: Secondary | ICD-10-CM | POA: Diagnosis not present

## 2020-10-05 DIAGNOSIS — Z9181 History of falling: Secondary | ICD-10-CM | POA: Diagnosis not present

## 2020-10-05 DIAGNOSIS — Z7951 Long term (current) use of inhaled steroids: Secondary | ICD-10-CM | POA: Diagnosis not present

## 2020-10-05 DIAGNOSIS — K5790 Diverticulosis of intestine, part unspecified, without perforation or abscess without bleeding: Secondary | ICD-10-CM | POA: Diagnosis not present

## 2020-10-05 DIAGNOSIS — N183 Chronic kidney disease, stage 3 unspecified: Secondary | ICD-10-CM | POA: Diagnosis not present

## 2020-10-05 DIAGNOSIS — Z951 Presence of aortocoronary bypass graft: Secondary | ICD-10-CM | POA: Diagnosis not present

## 2020-10-05 DIAGNOSIS — E1151 Type 2 diabetes mellitus with diabetic peripheral angiopathy without gangrene: Secondary | ICD-10-CM | POA: Diagnosis not present

## 2020-10-05 DIAGNOSIS — Z0000 Encounter for general adult medical examination without abnormal findings: Secondary | ICD-10-CM | POA: Diagnosis not present

## 2020-10-05 DIAGNOSIS — K219 Gastro-esophageal reflux disease without esophagitis: Secondary | ICD-10-CM | POA: Diagnosis not present

## 2020-10-05 DIAGNOSIS — F419 Anxiety disorder, unspecified: Secondary | ICD-10-CM | POA: Diagnosis not present

## 2020-10-05 DIAGNOSIS — C61 Malignant neoplasm of prostate: Secondary | ICD-10-CM | POA: Diagnosis not present

## 2020-10-05 DIAGNOSIS — I480 Paroxysmal atrial fibrillation: Secondary | ICD-10-CM | POA: Diagnosis not present

## 2020-10-15 DIAGNOSIS — M79671 Pain in right foot: Secondary | ICD-10-CM | POA: Diagnosis not present

## 2020-10-15 DIAGNOSIS — S92331D Displaced fracture of third metatarsal bone, right foot, subsequent encounter for fracture with routine healing: Secondary | ICD-10-CM | POA: Diagnosis not present

## 2020-10-15 DIAGNOSIS — S92321D Displaced fracture of second metatarsal bone, right foot, subsequent encounter for fracture with routine healing: Secondary | ICD-10-CM | POA: Diagnosis not present

## 2020-10-18 DIAGNOSIS — S92331B Displaced fracture of third metatarsal bone, right foot, initial encounter for open fracture: Secondary | ICD-10-CM | POA: Diagnosis not present

## 2020-10-18 DIAGNOSIS — N183 Chronic kidney disease, stage 3 unspecified: Secondary | ICD-10-CM | POA: Diagnosis not present

## 2020-10-18 DIAGNOSIS — I130 Hypertensive heart and chronic kidney disease with heart failure and stage 1 through stage 4 chronic kidney disease, or unspecified chronic kidney disease: Secondary | ICD-10-CM | POA: Diagnosis not present

## 2020-10-18 DIAGNOSIS — S92321B Displaced fracture of second metatarsal bone, right foot, initial encounter for open fracture: Secondary | ICD-10-CM | POA: Diagnosis not present

## 2020-10-18 DIAGNOSIS — I509 Heart failure, unspecified: Secondary | ICD-10-CM | POA: Diagnosis not present

## 2020-10-18 DIAGNOSIS — E1122 Type 2 diabetes mellitus with diabetic chronic kidney disease: Secondary | ICD-10-CM | POA: Diagnosis not present

## 2020-10-20 ENCOUNTER — Other Ambulatory Visit: Payer: Self-pay

## 2020-10-20 ENCOUNTER — Encounter: Payer: Self-pay | Admitting: Pulmonary Disease

## 2020-10-20 ENCOUNTER — Ambulatory Visit (INDEPENDENT_AMBULATORY_CARE_PROVIDER_SITE_OTHER): Payer: Medicare Other | Admitting: Pulmonary Disease

## 2020-10-20 DIAGNOSIS — J849 Interstitial pulmonary disease, unspecified: Secondary | ICD-10-CM

## 2020-10-20 DIAGNOSIS — D841 Defects in the complement system: Secondary | ICD-10-CM | POA: Diagnosis not present

## 2020-10-20 DIAGNOSIS — I2510 Atherosclerotic heart disease of native coronary artery without angina pectoris: Secondary | ICD-10-CM

## 2020-10-20 NOTE — Progress Notes (Signed)
   Subjective:    Patient ID: Daniel Herrera, male    DOB: 1940-12-18, 80 y.o.   MRN: [id number]  HPI  80 yo yo never smoker for FU of ILD/probable UIP  PMH- prostate CA,C diff colitis and SVTs that stop with vagal maneuvers,  angioedema, CABG 06/2018 chronic hoarseness for many years, ENT evaluation  negative   He presented in 2015 with dyspnea for a few months, PFTs were normal except for mildly decreased DLCO .  CT chest at [facility] showed mild interstitial prominence. This was attributed to chicken farm exposure or GERD related pneumonitis but has shown gradual progression to "probable UIP" pattern. He arrives in a wheelchair accompanied by his daughter today.  He had a foot injury and has been advised not to weight-bear, does not really complain of dyspnea.  He had congestion about a month ago which is now resolved.  He tested negative for COVID in June & April Oxygen saturation 97% on room air today. He remains on weekly 3 injections for recurrent angioedema    Significant tests/ events reviewed HRCT 09/2020 >> 'probable UIP' worse compared to 2015  HRCT chest 05/2019 -probable UIP, no definite honeycombing   CT chest without contrast 11/2013  showed interstitial prominence particularly apical with mild bronchiectasis, small patchy infiltrate was noted in the right lower lobe with a tiny effusion. ESR ,CCP neg   PFTs 09/2019 FVC stable at 94%, DLCO stable at 16.6/62% compared to 15.6 and 58% in 2020   PFTs 02/2014 - nml FEV1/ FVC & ratio, TLC 81%, DLCO 65%   Review of Systems neg for any significant sore throat, dysphagia, itching, sneezing, nasal congestion or excess/ purulent secretions, fever, chills, sweats, unintended wt loss, pleuritic or exertional cp, hempoptysis, orthopnea pnd or change in chronic leg swelling. Also denies presyncope, palpitations, heartburn, abdominal pain, nausea, vomiting, diarrhea or change in bowel or urinary habits, dysuria,hematuria, rash,  arthralgias, visual complaints, headache, numbness weakness or ataxia.     Objective:   Physical Exam  Gen. Pleasant, well-nourished, in no distress ENT - no thrush, no pallor/icterus,no post nasal drip Neck: No JVD, no thyromegaly, no carotid bruits Lungs: no use of accessory muscles, no dullness to percussion, BB dry rales no rhonchi  Cardiovascular: Rhythm regular, heart sounds  normal, no murmurs or gallops, no peripheral edema Musculoskeletal: No deformities, no cyanosis or clubbing        Assessment & Plan:

## 2020-10-20 NOTE — Patient Instructions (Signed)
Pulmonary fibrosis is progressive

## 2020-10-21 NOTE — Assessment & Plan Note (Addendum)
We reviewed HRCT from [facility].  This is probable UIP pattern and most likely IPF based on his history.  There is mild progression compared to 2015 but symptomatically he appears to be okay. We again discussed role of antifibrotic's and he is certain that he would not want any of the side effects associated with them.  Shared decision making and we decided to hold off He is not a candidate for rehab currently due to his foot injury and even otherwise has an active lifestyle

## 2020-10-21 NOTE — Assessment & Plan Note (Signed)
He is on 2 weekly injections of lanadelumab which is a monoclonal antibody to plasma kallilrein/bradykinin

## 2020-10-29 ENCOUNTER — Other Ambulatory Visit: Payer: Self-pay

## 2020-11-02 ENCOUNTER — Ambulatory Visit (INDEPENDENT_AMBULATORY_CARE_PROVIDER_SITE_OTHER): Payer: Medicare Other | Admitting: Cardiology

## 2020-11-02 ENCOUNTER — Encounter: Payer: Self-pay | Admitting: Cardiology

## 2020-11-02 ENCOUNTER — Other Ambulatory Visit: Payer: Self-pay

## 2020-11-02 VITALS — BP 148/62 | HR 77 | Ht 73.0 in | Wt 202.8 lb

## 2020-11-02 DIAGNOSIS — Z951 Presence of aortocoronary bypass graft: Secondary | ICD-10-CM | POA: Diagnosis not present

## 2020-11-02 DIAGNOSIS — E785 Hyperlipidemia, unspecified: Secondary | ICD-10-CM | POA: Diagnosis not present

## 2020-11-02 DIAGNOSIS — I2510 Atherosclerotic heart disease of native coronary artery without angina pectoris: Secondary | ICD-10-CM

## 2020-11-02 DIAGNOSIS — I10 Essential (primary) hypertension: Secondary | ICD-10-CM | POA: Diagnosis not present
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
## 2020-11-02 NOTE — Patient Instructions (Signed)

## 2020-11-15 DIAGNOSIS — Z4789 Encounter for other orthopedic aftercare: Secondary | ICD-10-CM | POA: Diagnosis not present

## 2020-11-15 DIAGNOSIS — M79671 Pain in right foot: Secondary | ICD-10-CM | POA: Diagnosis not present

## 2020-12-13 DIAGNOSIS — S92331B Displaced fracture of third metatarsal bone, right foot, initial encounter for open fracture: Secondary | ICD-10-CM | POA: Diagnosis not present

## 2020-12-13 DIAGNOSIS — S92321B Displaced fracture of second metatarsal bone, right foot, initial encounter for open fracture: Secondary | ICD-10-CM | POA: Diagnosis not present

## 2020-12-17 DIAGNOSIS — E538 Deficiency of other specified B group vitamins: Secondary | ICD-10-CM | POA: Diagnosis not present

## 2020-12-17 DIAGNOSIS — D649 Anemia, unspecified: Secondary | ICD-10-CM | POA: Diagnosis not present

## 2020-12-17 DIAGNOSIS — I2510 Atherosclerotic heart disease of native coronary artery without angina pectoris: Secondary | ICD-10-CM | POA: Diagnosis not present

## 2020-12-17 DIAGNOSIS — Z6827 Body mass index (BMI) 27.0-27.9, adult: Secondary | ICD-10-CM | POA: Diagnosis not present

## 2020-12-17 DIAGNOSIS — E1169 Type 2 diabetes mellitus with other specified complication: Secondary | ICD-10-CM | POA: Diagnosis not present

## 2020-12-17 DIAGNOSIS — E785 Hyperlipidemia, unspecified: Secondary | ICD-10-CM | POA: Diagnosis not present

## 2020-12-17 DIAGNOSIS — E039 Hypothyroidism, unspecified: Secondary | ICD-10-CM | POA: Diagnosis not present

## 2020-12-17 DIAGNOSIS — E559 Vitamin D deficiency, unspecified: Secondary | ICD-10-CM | POA: Diagnosis not present

## 2020-12-17 DIAGNOSIS — D841 Defects in the complement system: Secondary | ICD-10-CM | POA: Diagnosis not present

## 2020-12-17 DIAGNOSIS — I10 Essential (primary) hypertension: Secondary | ICD-10-CM | POA: Diagnosis not present

## 2020-12-17 DIAGNOSIS — N1831 Chronic kidney disease, stage 3a: Secondary | ICD-10-CM | POA: Diagnosis not present

## 2020-12-17 DIAGNOSIS — J849 Interstitial pulmonary disease, unspecified: Secondary | ICD-10-CM | POA: Diagnosis not present

## 2021-01-06 DIAGNOSIS — Z23 Encounter for immunization: Secondary | ICD-10-CM | POA: Diagnosis not present

## 2021-01-20 DIAGNOSIS — Z23 Encounter for immunization: Secondary | ICD-10-CM | POA: Diagnosis not present

## 2021-03-07 ENCOUNTER — Other Ambulatory Visit: Payer: Self-pay | Admitting: *Deleted

## 2021-03-07 MED ORDER — ICATIBANT ACETATE 30 MG/3ML ~~LOC~~ SOLN: 30.0000 mg | Freq: Once | SUBCUTANEOUS | 5 refills | Status: DC | PRN

## 2021-05-04 ENCOUNTER — Other Ambulatory Visit: Payer: Self-pay

## 2021-05-05 ENCOUNTER — Ambulatory Visit (INDEPENDENT_AMBULATORY_CARE_PROVIDER_SITE_OTHER): Payer: Medicare Other | Admitting: Cardiology

## 2021-05-05 ENCOUNTER — Encounter: Payer: Self-pay | Admitting: Cardiology

## 2021-05-05 ENCOUNTER — Other Ambulatory Visit: Payer: Self-pay

## 2021-05-05 VITALS — BP 142/76 | HR 70 | Ht 73.0 in | Wt 201.4 lb

## 2021-05-05 DIAGNOSIS — I10 Essential (primary) hypertension: Secondary | ICD-10-CM | POA: Diagnosis not present

## 2021-05-05 DIAGNOSIS — I2510 Atherosclerotic heart disease of native coronary artery without angina pectoris: Secondary | ICD-10-CM | POA: Diagnosis not present

## 2021-05-05 DIAGNOSIS — E785 Hyperlipidemia, unspecified: Secondary | ICD-10-CM

## 2021-05-05 NOTE — Patient Instructions (Signed)

## 2021-05-05 NOTE — Progress Notes (Signed)
Cardiology Office Note:    Date:  05/05/2021   ID:  SAHMIR WEATHERBEE, DOB [birth year], MRN [id number]  PCP:  [name], NP  Cardiologist:  Jenean Lindau, MD   Referring MD: [name], NP    ASSESSMENT:    1. Essential hypertension   2. Dyslipidemia, goal LDL below 70   3. Coronary artery disease involving native coronary artery of native heart without angina pectoris    PLAN:    In order of problems listed above:  Coronary artery disease: Secondary prevention stressed with the patient.  Importance of compliance with diet medication stressed and she vocalized understanding.  He goes to the gym on a regular basis and has a good exercise protocol Essential hypertension: Blood pressure stable and diet was emphasized.  Lifestyle modification urged.  Salt intake issues were discussed Mixed dyslipidemia: Diet emphasized.  Lipids reviewed he is going to go back to his primary care in the next week or 2 and send me a copy of all the lab work.  Questions were answered to satisfaction. Patient will be seen in follow-up appointment in 9 months or earlier if the patient has any concerns    Medication Adjustments/Labs and Tests Ordered: Current medicines are reviewed at length with the patient today.  Concerns regarding medicines are outlined above.  No orders of the defined types were placed in this encounter.  No orders of the defined types were placed in this encounter.    No chief complaint on file.    History of Present Illness:    Daniel Herrera is a 81 y.o. male.  Patient has past medical history of coronary artery disease, essential hypertension, dyslipidemia and renal insufficiency.  He denies any problems at this time and takes care of activities of daily living.  No chest pain orthopnea or PND.  He has had a history of GI bleeding in the past.  At the time of my evaluation, the patient is alert awake oriented and in no distress.  Past Medical History:  Diagnosis Date    Abnormal EKG 02/21/2015   Acute lower GI bleeding 05/28/2019   Anginal pain ([place]) 2016   Angio-edema    Anxiety    Arthritis    CAD (coronary artery disease)    a. 02/2015: DES to mid-LAD   CAD S/P percutaneous coronary angioplasty    NSTEMI treated with LAD PCI with DES x 03 Feb 2015 Myoview low risk Nov 2017   Cataract    CHF (congestive heart failure) ([place]) 2020   after surgery only   Chronic diarrhea of unknown origin    CKD (chronic kidney disease) stage 3, GFR 30-59 ml/min 12/21/2017   Colitis 05/23/2015   Diverticular hemorrhage    Diverticulosis    Dyslipidemia, goal LDL below 70 02/21/2015   hyperlipidemia    Dysrhythmia    A-fib   Enterotoxigenic Escherichia coli infection 05/24/2015   Essential hypertension 02/21/2015   Geographic tongue    GERD (gastroesophageal reflux disease)    GI bleed 12/21/2017   Glossitis, benign migratory 12/04/2018   Gout    Hematochezia 12/21/2017   Admitted Sept 2019 with lower GI bleeding felt to be secondary to hemorrhoids   Hereditary angioedema type 2 ([place]) 07/28/2017   High cholesterol    History of kidney stones    History of non-ST elevation myocardial infarction (NSTEMI) 02/22/2015   Nov 2016   History of prolonged Q-T interval on ECG    History of prostate  cancer    Hoarseness 12/04/2018   Hx of Clostridium difficile infection    Hx of umbilical hernia repair    Hypertension    Hypothyroidism    ILD (interstitial lung disease) ([place]) 12/17/2013   ?related to chicken farm exposure or aspiration pneumonitis from GERD Previously attributed to acute interstitial pneumonitis but based on HRCT this is likely IPF, "probable UIP"   Interstitial lung disease ([place])    Lower GI bleed 06/03/2019   Malignant neoplasm of prostate ([place]) 08/07/2014   Myocardial infarction [facility]) 2016   NSTEMI (non-ST elevated myocardial infarction) ([place]) 05/21/2018   Open metatarsal fracture 09/02/2020   PAF (paroxysmal atrial fibrillation) ([place]) 06/13/2018   Post  CABG- discharged on Amiodarone, he will probably not need this long term   Palpitations 03/15/2015   palpitations    Peripheral vascular disease (HCC)    vericose veins   Personal history of digestive disease    gastric ulcer   Pre-diabetes    Pre-operative clearance 01/02/2018   Prostate cancer ([place])    Prostate enlargement    S/P CABG x 3 05/28/2018    LIMA-LAD, RIMA-RCA, and Lt radial to OM.   Skin cancer    basal cell carcinoma   STEC (Shiga toxin-producing Escherichia coli) infection 05/24/2015   Tachycardia determined by examination of pulse    Thyroid disease    Type 2 diabetes mellitus with unspecified complications ([place])    Urticaria     Past Surgical History:  Procedure Laterality Date   APPENDECTOMY     BACK SURGERY     CARDIAC CATHETERIZATION N/A 02/21/2015   Procedure: Left Heart Cath;  Surgeon: Lorretta Harp, MD;  Location: [place] [facility];  Service: Cardiovascular;  Laterality: N/A;   CATARACT EXTRACTION, BILATERAL     CORONARY ARTERY BYPASS GRAFT N/A 05/24/2018   Procedure: CORONARY ARTERY BYPASS GRAFTING (CABG), ON PUMP, TIMES THREE, USING BILATERAL INTERNAL MAMMARY ARTERY AND HARVESTED LEFT RADIAL ARTERY;  Surgeon: Gaye Pollack, MD;  Location: [place];  Service: Open Heart Surgery;  Laterality: N/A;   CYSTOSCOPY WITH LITHOLAPAXY N/A 07/26/2020   Procedure: CYSTOSCOPY WITH LITHOLAPAXY WITH FULGERATION;  Surgeon: Raynelle Bring, MD;  [facility];  Service: Urology;  Laterality: N/A;   HOLMIUM LASER APPLICATION N/A 1/82/9937   Procedure: HOLMIUM LASER APPLICATION;  Surgeon: Raynelle Bring, MD;  [facility];  Service: Urology;  Laterality: N/A;   I & D EXTREMITY Right 09/02/2020   Procedure: IRRIGATION AND DEBRIDEMENT RIGHT 2ND AND 3RD METATARSAL;  Surgeon: Wylene Simmer, MD;  Location: [place];  Service: Orthopedics;  Laterality: Right;   LEFT HEART CATH AND CORONARY ANGIOGRAPHY N/A 05/22/2018   Procedure: LEFT HEART CATH AND CORONARY ANGIOGRAPHY;  Surgeon:  Jettie Booze, MD;  Location: [place] [facility];  Service: Cardiovascular;  Laterality: N/A;   NASAL SINUS SURGERY     ORIF TOE FRACTURE Right 09/02/2020   Procedure: OPEN REDUCTION INTERNAL FIXATION (ORIF) OF RIGHT OPEN 2ND AND 3RD METATARSAL (TOE) OPEN FRACTURE;  Surgeon: Wylene Simmer, MD;  Location: [place];  Service: Orthopedics;  Laterality: Right;   PROSTATE BIOPSY     RADIAL ARTERY HARVEST Left 05/24/2018   Procedure: RADIAL ARTERY HARVEST;  Surgeon: Gaye Pollack, MD;  Location: [place];  Service: Open Heart Surgery;  Laterality: Left;   right shoulder rotator cuff repair     TEE WITHOUT CARDIOVERSION N/A 05/24/2018   Procedure: TRANSESOPHAGEAL ECHOCARDIOGRAM (TEE);  Surgeon: Gaye Pollack, MD;  Location: [place];  Service: Open Heart Surgery;  Laterality: N/A;    Current Medications: Current Meds  Medication Sig   aspirin EC 81 MG tablet Take 81 mg by mouth daily. Swallow whole.   Azelastine HCl 137 MCG/SPRAY SOLN Place 1 spray into both nostrils 2 (two) times daily.   betamethasone valerate ointment (VALISONE) 0.1 % Apply 1 application topically 2 (two) times daily as needed for rash.   Cyanocobalamin (VITAMIN B-12) 5000 MCG TBDP Take 5,000 mcg by mouth daily.    dextromethorphan-guaiFENesin (MUCINEX DM) 30-600 MG 12hr tablet Take 1 tablet by mouth in the morning and at bedtime.   EPINEPHrine 0.3 mg/0.3 mL IJ SOAJ injection Inject 0.3 mg into the muscle as needed for anaphylaxis.   fluoruracil (CARAC) 0.5 % cream Apply 1 application topically as needed for rash. For skin cancer flare up   fluticasone (FLONASE) 50 MCG/ACT nasal spray Place 1 spray into both nostrils 2 (two) times daily.   glipiZIDE (GLUCOTROL XL) 2.5 MG 24 hr tablet Take 2.5 mg by mouth every morning.   icatibant (FIRAZYR) 30 MG/3ML injection Inject 3 mLs (30 mg total) into the skin once as needed (for sudden attacks of hereditary angioedema).   levothyroxine (SYNTHROID) 100 MCG tablet Take 100 mcg by mouth daily  before breakfast.   lidocaine (XYLOCAINE) 2 % solution Use as directed 15 mLs in the mouth or throat daily.   metoprolol succinate (TOPROL-XL) 25 MG 24 hr tablet Take 25 mg by mouth daily.   metoprolol tartrate (LOPRESSOR) 25 MG tablet Take 0.5 tablets (12.5 mg total) by mouth 2 (two) times daily.   nabumetone (RELAFEN) 750 MG tablet Take 750 mg by mouth 2 (two) times daily.   nitroGLYCERIN (NITROSTAT) 0.4 MG SL tablet Place 0.4 mg under the tongue every 5 (five) minutes as needed for chest pain.   NONFORMULARY OR COMPOUNDED ITEM Use as directed 5-15 mLs in the mouth or throat See admin instructions. Compounded mouth wash/gargle:1/1/1/1/1 (80 ml's of each) Purified water; Lidocaine Viscous; Nystatin suspension; Prednisolone 15/5; Maalox: Swish and spit using 5-15 ml's every 6 hours as needed for irritation   OVER THE COUNTER MEDICATION Take 1 capsule by mouth in the morning and at bedtime. Health Plus/Super Colon Cleanse   pantoprazole (PROTONIX) 40 MG tablet Take 40 mg by mouth 2 (two) times daily.   Pitavastatin Calcium (LIVALO) 4 MG TABS Take 4 mg by mouth at bedtime.   Probiotic Product (PROBIOTIC DAILY PO) Take 1 capsule by mouth daily.   senna (SENOKOT) 8.6 MG TABS tablet Take 2 tablets (17.2 mg total) by mouth 2 (two) times daily.   TAKHZYRO 300 MG/2ML SOSY Inject 300 mg into the skin every 14 (fourteen) days.     Allergies:   Lisinopril, Oxycodone, Tape, Benadryl [diphenhydramine hcl], Darvocet [propoxyphene n-acetaminophen], Darvon [propoxyphene hcl], and Tylenol [acetaminophen]   Social History   Socioeconomic History   Marital status: Married    Spouse name: Not on file   Number of children: Not on file   Years of education: Not on file   Highest education level: Not on file  Occupational History   Occupation: retired  Tobacco Use   Smoking status: Never   Smokeless tobacco: Never  Vaping Use   Vaping Use: Never used  Substance and Sexual Activity   Alcohol use: Never    Drug use: No   Sexual activity: Not on file  Other Topics Concern   Not on file  Social History Narrative   Not on file  Social Determinants of Health   Financial Resource Strain: Not on file  Food Insecurity: Not on file  Transportation Needs: Not on file  Physical Activity: Not on file  Stress: Not on file  Social Connections: Not on file     Family History: The patient's family history includes Cancer in his father, mother, and sister; Stomach cancer in his mother.  ROS:   Please see the history of present illness.    All other systems reviewed and are negative.  EKGs/Labs/Other Studies Reviewed:    The following studies were reviewed today: EKG reveals sinus rhythm and nonspecific ST-T changes   Recent Labs: 07/27/2020: ALT 19 09/02/2020: B Natriuretic Peptide 150.2; BUN 27; Creatinine, Ser 1.40; Hemoglobin 13.9; Platelets 161; Potassium 3.8; Sodium 142  Recent Lipid Panel    Component Value Date/Time   CHOL 183 05/23/2019 0826   TRIG 179 (H) 05/23/2019 0826   HDL 44 05/23/2019 0826   CHOLHDL 4.2 05/23/2019 0826   CHOLHDL 3.7 05/24/2015 0725   VLDL 38 05/24/2015 0725   LDLCALC 108 (H) 05/23/2019 0826    Physical Exam:    VS:  BP (!) 142/76    Pulse 70    Ht 6\' 1"  (1.854 m)    Wt 201 lb 6.4 oz (91.4 kg)    SpO2 96%    BMI 26.57 kg/m     Wt Readings from Last 3 Encounters:  05/05/21 201 lb 6.4 oz (91.4 kg)  11/02/20 202 lb 12.8 oz (92 kg)  10/20/20 198 lb (89.8 kg)     GEN: Patient is in no acute distress HEENT: Normal NECK: No JVD; No carotid bruits LYMPHATICS: No lymphadenopathy CARDIAC: Hear sounds regular, 2/6 systolic murmur at the apex. RESPIRATORY:  Clear to auscultation without rales, wheezing or rhonchi  ABDOMEN: Soft, non-tender, non-distended MUSCULOSKELETAL:  No edema; No deformity  SKIN: Warm and dry NEUROLOGIC:  Alert and oriented x 3 PSYCHIATRIC:  Normal affect   Signed, Jenean Lindau, MD  05/05/2021 1:08 PM    [facility]

## 2021-05-23 DIAGNOSIS — Z6827 Body mass index (BMI) 27.0-27.9, adult: Secondary | ICD-10-CM | POA: Diagnosis not present

## 2021-05-23 DIAGNOSIS — D649 Anemia, unspecified: Secondary | ICD-10-CM | POA: Diagnosis not present

## 2021-05-23 DIAGNOSIS — I2510 Atherosclerotic heart disease of native coronary artery without angina pectoris: Secondary | ICD-10-CM | POA: Diagnosis not present

## 2021-05-23 DIAGNOSIS — I10 Essential (primary) hypertension: Secondary | ICD-10-CM | POA: Diagnosis not present

## 2021-05-23 DIAGNOSIS — E785 Hyperlipidemia, unspecified: Secondary | ICD-10-CM | POA: Diagnosis not present

## 2021-05-23 DIAGNOSIS — E559 Vitamin D deficiency, unspecified: Secondary | ICD-10-CM | POA: Diagnosis not present

## 2021-05-23 DIAGNOSIS — E039 Hypothyroidism, unspecified: Secondary | ICD-10-CM | POA: Diagnosis not present

## 2021-05-23 DIAGNOSIS — J849 Interstitial pulmonary disease, unspecified: Secondary | ICD-10-CM | POA: Diagnosis not present

## 2021-05-23 DIAGNOSIS — E538 Deficiency of other specified B group vitamins: Secondary | ICD-10-CM | POA: Diagnosis not present

## 2021-05-23 DIAGNOSIS — E1169 Type 2 diabetes mellitus with other specified complication: Secondary | ICD-10-CM | POA: Diagnosis not present

## 2021-05-23 DIAGNOSIS — N1831 Chronic kidney disease, stage 3a: Secondary | ICD-10-CM | POA: Diagnosis not present

## 2021-05-23 DIAGNOSIS — D841 Defects in the complement system: Secondary | ICD-10-CM | POA: Diagnosis not present

## 2021-06-14 ENCOUNTER — Inpatient Hospital Stay (HOSPITAL_COMMUNITY)
Admission: EM | Admit: 2021-06-14 | Discharge: 2021-06-16 | DRG: 066 | Disposition: A | Discharge status: Home Health | Payer: Medicare Other | Attending: Internal Medicine | Admitting: Internal Medicine

## 2021-06-14 ENCOUNTER — Emergency Department (HOSPITAL_COMMUNITY): Payer: Medicare Other

## 2021-06-14 ENCOUNTER — Other Ambulatory Visit: Payer: Self-pay

## 2021-06-14 ENCOUNTER — Encounter (HOSPITAL_COMMUNITY): Payer: Self-pay

## 2021-06-14 ENCOUNTER — Observation Stay (HOSPITAL_COMMUNITY): Payer: Medicare Other

## 2021-06-14 DIAGNOSIS — Z951 Presence of aortocoronary bypass graft: Secondary | ICD-10-CM | POA: Diagnosis not present

## 2021-06-14 DIAGNOSIS — E1151 Type 2 diabetes mellitus with diabetic peripheral angiopathy without gangrene: Secondary | ICD-10-CM | POA: Diagnosis present

## 2021-06-14 DIAGNOSIS — Z79890 Hormone replacement therapy (postmenopausal): Secondary | ICD-10-CM

## 2021-06-14 DIAGNOSIS — I440 Atrioventricular block, first degree: Secondary | ICD-10-CM | POA: Diagnosis present

## 2021-06-14 DIAGNOSIS — Z87442 Personal history of urinary calculi: Secondary | ICD-10-CM

## 2021-06-14 DIAGNOSIS — I63442 Cerebral infarction due to embolism of left cerebellar artery: Secondary | ICD-10-CM | POA: Diagnosis present

## 2021-06-14 DIAGNOSIS — I6389 Other cerebral infarction: Secondary | ICD-10-CM | POA: Diagnosis not present

## 2021-06-14 DIAGNOSIS — Z955 Presence of coronary angioplasty implant and graft: Secondary | ICD-10-CM

## 2021-06-14 DIAGNOSIS — I2510 Atherosclerotic heart disease of native coronary artery without angina pectoris: Secondary | ICD-10-CM

## 2021-06-14 DIAGNOSIS — I6502 Occlusion and stenosis of left vertebral artery: Secondary | ICD-10-CM | POA: Diagnosis present

## 2021-06-14 DIAGNOSIS — Z8546 Personal history of malignant neoplasm of prostate: Secondary | ICD-10-CM

## 2021-06-14 DIAGNOSIS — R29700 NIHSS score 0: Secondary | ICD-10-CM | POA: Diagnosis present

## 2021-06-14 DIAGNOSIS — I672 Cerebral atherosclerosis: Secondary | ICD-10-CM | POA: Diagnosis not present

## 2021-06-14 DIAGNOSIS — E7800 Pure hypercholesterolemia, unspecified: Secondary | ICD-10-CM | POA: Diagnosis present

## 2021-06-14 DIAGNOSIS — N1831 Chronic kidney disease, stage 3a: Secondary | ICD-10-CM | POA: Diagnosis present

## 2021-06-14 DIAGNOSIS — M109 Gout, unspecified: Secondary | ICD-10-CM | POA: Diagnosis present

## 2021-06-14 DIAGNOSIS — J84112 Idiopathic pulmonary fibrosis: Secondary | ICD-10-CM | POA: Diagnosis present

## 2021-06-14 DIAGNOSIS — D841 Defects in the complement system: Secondary | ICD-10-CM | POA: Diagnosis present

## 2021-06-14 DIAGNOSIS — I129 Hypertensive chronic kidney disease with stage 1 through stage 4 chronic kidney disease, or unspecified chronic kidney disease: Secondary | ICD-10-CM | POA: Diagnosis present

## 2021-06-14 DIAGNOSIS — I10 Essential (primary) hypertension: Secondary | ICD-10-CM | POA: Diagnosis not present

## 2021-06-14 DIAGNOSIS — K219 Gastro-esophageal reflux disease without esophagitis: Secondary | ICD-10-CM | POA: Diagnosis present

## 2021-06-14 DIAGNOSIS — Z888 Allergy status to other drugs, medicaments and biological substances status: Secondary | ICD-10-CM

## 2021-06-14 DIAGNOSIS — E119 Type 2 diabetes mellitus without complications: Secondary | ICD-10-CM | POA: Diagnosis not present

## 2021-06-14 DIAGNOSIS — Z8711 Personal history of peptic ulcer disease: Secondary | ICD-10-CM

## 2021-06-14 DIAGNOSIS — R2681 Unsteadiness on feet: Secondary | ICD-10-CM | POA: Diagnosis present

## 2021-06-14 DIAGNOSIS — I252 Old myocardial infarction: Secondary | ICD-10-CM

## 2021-06-14 DIAGNOSIS — E1122 Type 2 diabetes mellitus with diabetic chronic kidney disease: Secondary | ICD-10-CM | POA: Diagnosis present

## 2021-06-14 DIAGNOSIS — I639 Cerebral infarction, unspecified: Secondary | ICD-10-CM | POA: Diagnosis present

## 2021-06-14 DIAGNOSIS — I63212 Cerebral infarction due to unspecified occlusion or stenosis of left vertebral arteries: Secondary | ICD-10-CM | POA: Diagnosis not present

## 2021-06-14 DIAGNOSIS — Z800 Family history of malignant neoplasm of digestive organs: Secondary | ICD-10-CM

## 2021-06-14 DIAGNOSIS — E039 Hypothyroidism, unspecified: Secondary | ICD-10-CM

## 2021-06-14 DIAGNOSIS — Z8679 Personal history of other diseases of the circulatory system: Secondary | ICD-10-CM

## 2021-06-14 DIAGNOSIS — Z7984 Long term (current) use of oral hypoglycemic drugs: Secondary | ICD-10-CM

## 2021-06-14 DIAGNOSIS — R29818 Other symptoms and signs involving the nervous system: Secondary | ICD-10-CM | POA: Diagnosis not present

## 2021-06-14 DIAGNOSIS — I130 Hypertensive heart and chronic kidney disease with heart failure and stage 1 through stage 4 chronic kidney disease, or unspecified chronic kidney disease: Secondary | ICD-10-CM | POA: Diagnosis present

## 2021-06-14 DIAGNOSIS — M1990 Unspecified osteoarthritis, unspecified site: Secondary | ICD-10-CM | POA: Diagnosis present

## 2021-06-14 DIAGNOSIS — I6501 Occlusion and stenosis of right vertebral artery: Secondary | ICD-10-CM | POA: Diagnosis not present

## 2021-06-14 DIAGNOSIS — I6623 Occlusion and stenosis of bilateral posterior cerebral arteries: Secondary | ICD-10-CM | POA: Diagnosis not present

## 2021-06-14 DIAGNOSIS — I480 Paroxysmal atrial fibrillation: Secondary | ICD-10-CM | POA: Diagnosis present

## 2021-06-14 DIAGNOSIS — R42 Dizziness and giddiness: Secondary | ICD-10-CM | POA: Diagnosis not present

## 2021-06-14 DIAGNOSIS — Z885 Allergy status to narcotic agent status: Secondary | ICD-10-CM

## 2021-06-14 DIAGNOSIS — Z79899 Other long term (current) drug therapy: Secondary | ICD-10-CM

## 2021-06-14 DIAGNOSIS — Z20822 Contact with and (suspected) exposure to covid-19: Secondary | ICD-10-CM | POA: Diagnosis present

## 2021-06-14 DIAGNOSIS — Z7982 Long term (current) use of aspirin: Secondary | ICD-10-CM

## 2021-06-14 DIAGNOSIS — R270 Ataxia, unspecified: Secondary | ICD-10-CM | POA: Diagnosis not present

## 2021-06-14 DIAGNOSIS — I6612 Occlusion and stenosis of left anterior cerebral artery: Secondary | ICD-10-CM | POA: Diagnosis not present

## 2021-06-14 DIAGNOSIS — Z85828 Personal history of other malignant neoplasm of skin: Secondary | ICD-10-CM

## 2021-06-14 HISTORY — DX: Cerebral infarction, unspecified: I63.9

## 2021-06-14 LAB — URINALYSIS, ROUTINE W REFLEX MICROSCOPIC
Bilirubin Urine: NEGATIVE
Glucose, UA: NEGATIVE mg/dL
Hgb urine dipstick: NEGATIVE
Ketones, ur: NEGATIVE mg/dL
Leukocytes,Ua: NEGATIVE
Nitrite: NEGATIVE
Protein, ur: NEGATIVE mg/dL
Specific Gravity, Urine: 1.012 (ref 1.005–1.030)
pH: 6 (ref 5.0–8.0)

## 2021-06-14 LAB — RESP PANEL BY RT-PCR (FLU A&B, COVID) ARPGX2
Influenza A by PCR: NEGATIVE
Influenza B by PCR: NEGATIVE
SARS Coronavirus 2 by RT PCR: NEGATIVE

## 2021-06-14 LAB — COMPREHENSIVE METABOLIC PANEL
ALT: 22 U/L (ref 0–44)
AST: 23 U/L (ref 15–41)
Albumin: 3.9 g/dL (ref 3.5–5.0)
Alkaline Phosphatase: 70 U/L (ref 38–126)
Anion gap: 9 (ref 5–15)
BUN: 14 mg/dL (ref 8–23)
CO2: 25 mmol/L (ref 22–32)
Calcium: 9.1 mg/dL (ref 8.9–10.3)
Chloride: 102 mmol/L (ref 98–111)
Creatinine, Ser: 1.5 mg/dL — ABNORMAL HIGH (ref 0.61–1.24)
GFR, Estimated: 46 mL/min — ABNORMAL LOW (ref >60)
Glucose, Bld: 131 mg/dL — ABNORMAL HIGH (ref 70–99)
Potassium: 4 mmol/L (ref 3.5–5.1)
Sodium: 136 mmol/L (ref 135–145)
Total Bilirubin: 0.5 mg/dL (ref 0.3–1.2)
Total Protein: 6.4 g/dL — ABNORMAL LOW (ref 6.5–8.1)

## 2021-06-14 LAB — I-STAT CHEM 8, ED
BUN: 16 mg/dL (ref 8–23)
Calcium, Ion: 1.17 mmol/L (ref 1.15–1.40)
Chloride: 102 mmol/L (ref 98–111)
Creatinine, Ser: 1.6 mg/dL — ABNORMAL HIGH (ref 0.61–1.24)
Glucose, Bld: 126 mg/dL — ABNORMAL HIGH (ref 70–99)
HCT: 43 % (ref 39.0–52.0)
Hemoglobin: 14.6 g/dL (ref 13.0–17.0)
Potassium: 4.1 mmol/L (ref 3.5–5.1)
Sodium: 138 mmol/L (ref 135–145)
TCO2: 28 mmol/L (ref 22–32)

## 2021-06-14 LAB — CBC
HCT: 42.9 % (ref 39.0–52.0)
Hemoglobin: 14.7 g/dL (ref 13.0–17.0)
MCH: 31.6 pg (ref 26.0–34.0)
MCHC: 34.3 g/dL (ref 30.0–36.0)
MCV: 92.3 fL (ref 80.0–100.0)
Platelets: 169 10*3/uL (ref 150–400)
RBC: 4.65 MIL/uL (ref 4.22–5.81)
RDW: 12.8 % (ref 11.5–15.5)
WBC: 5.2 10*3/uL (ref 4.0–10.5)
nRBC: 0 % (ref 0.0–0.2)

## 2021-06-14 LAB — RAPID URINE DRUG SCREEN, HOSP PERFORMED
Amphetamines: NOT DETECTED
Barbiturates: NOT DETECTED
Benzodiazepines: NOT DETECTED
Cocaine: NOT DETECTED
Opiates: NOT DETECTED
Tetrahydrocannabinol: NOT DETECTED

## 2021-06-14 LAB — GLUCOSE, CAPILLARY: Glucose-Capillary: 95 mg/dL (ref 70–99)

## 2021-06-14 LAB — DIFFERENTIAL
Abs Immature Granulocytes: 0.02 10*3/uL (ref 0.00–0.07)
Basophils Absolute: 0 10*3/uL (ref 0.0–0.1)
Basophils Relative: 1 %
Eosinophils Absolute: 0.1 10*3/uL (ref 0.0–0.5)
Eosinophils Relative: 1 %
Immature Granulocytes: 0 %
Lymphocytes Relative: 19 %
Lymphs Abs: 1 10*3/uL (ref 0.7–4.0)
Monocytes Absolute: 0.4 10*3/uL (ref 0.1–1.0)
Monocytes Relative: 8 %
Neutro Abs: 3.7 10*3/uL (ref 1.7–7.7)
Neutrophils Relative %: 71 %

## 2021-06-14 LAB — APTT: aPTT: 31 seconds (ref 24–36)

## 2021-06-14 LAB — PROTIME-INR
INR: 1.1 (ref 0.8–1.2)
Prothrombin Time: 14.5 seconds (ref 11.4–15.2)

## 2021-06-14 MED ORDER — NITROGLYCERIN 0.4 MG SL SUBL: 0.4000 mg | SUBLINGUAL_TABLET | SUBLINGUAL | Status: DC | PRN

## 2021-06-14 MED ORDER — GLIPIZIDE ER 2.5 MG PO TB24
2.5000 mg | ORAL_TABLET | Freq: Every morning | ORAL | Status: DC
  Administered 2021-06-15 – 2021-06-16 (×2): 2.5 mg via ORAL
  Filled 2021-06-14 (×2): qty 1

## 2021-06-14 MED ORDER — AZELASTINE HCL 0.1 % NA SOLN
1.0000 | Freq: Two times a day (BID) | NASAL | Status: DC
  Filled 2021-06-14 (×2): qty 30

## 2021-06-14 MED ORDER — RISAQUAD PO CAPS
1.0000 | ORAL_CAPSULE | Freq: Every day | ORAL | Status: DC
  Administered 2021-06-15 – 2021-06-16 (×2): 1 via ORAL
  Filled 2021-06-14 (×2): qty 1

## 2021-06-14 MED ORDER — ICATIBANT ACETATE 30 MG/3ML ~~LOC~~ SOLN: 30.0000 mg | Freq: Once | SUBCUTANEOUS | Status: DC | PRN

## 2021-06-14 MED ORDER — ASPIRIN 81 MG PO CHEW
81.0000 mg | CHEWABLE_TABLET | Freq: Every day | ORAL | Status: DC
Start: 2021-06-14 — End: 2021-06-15
  Administered 2021-06-14: 81 mg via ORAL
  Filled 2021-06-14: qty 1

## 2021-06-14 MED ORDER — ACETAMINOPHEN 650 MG RE SUPP: 650.0000 mg | RECTAL | Status: DC | PRN

## 2021-06-14 MED ORDER — EPINEPHRINE 0.3 MG/0.3ML IJ SOAJ
0.3000 mg | INTRAMUSCULAR | Status: DC | PRN
  Filled 2021-06-14: qty 0.6

## 2021-06-14 MED ORDER — FLUTICASONE PROPIONATE 50 MCG/ACT NA SUSP
1.0000 | Freq: Two times a day (BID) | NASAL | Status: DC
  Administered 2021-06-15 – 2021-06-16 (×2): 1 via NASAL
  Filled 2021-06-14 (×2): qty 16

## 2021-06-14 MED ORDER — ASPIRIN EC 81 MG PO TBEC: 81.0000 mg | DELAYED_RELEASE_TABLET | Freq: Every day | ORAL | Status: DC

## 2021-06-14 MED ORDER — CLOPIDOGREL BISULFATE 75 MG PO TABS
75.0000 mg | ORAL_TABLET | Freq: Every day | ORAL | Status: DC
  Administered 2021-06-14 – 2021-06-15 (×2): 75 mg via ORAL
  Filled 2021-06-14 (×2): qty 1

## 2021-06-14 MED ORDER — SODIUM CHLORIDE 0.9 % IV BOLUS
1000.0000 mL | Freq: Once | INTRAVENOUS | Status: AC
  Administered 2021-06-14: 1000 mL via INTRAVENOUS

## 2021-06-14 MED ORDER — ONDANSETRON HCL 4 MG/2ML IJ SOLN: 4.0000 mg | INTRAMUSCULAR | Status: DC | PRN

## 2021-06-14 MED ORDER — STROKE: EARLY STAGES OF RECOVERY BOOK
Freq: Once | Status: AC
  Filled 2021-06-14: qty 1

## 2021-06-14 MED ORDER — MAGNESIUM HYDROXIDE 400 MG/5ML PO SUSP
30.0000 mL | Freq: Every day | ORAL | Status: DC | PRN
  Filled 2021-06-14: qty 30

## 2021-06-14 MED ORDER — SENNA 8.6 MG PO TABS
2.0000 | ORAL_TABLET | Freq: Two times a day (BID) | ORAL | Status: DC
  Administered 2021-06-15 – 2021-06-16 (×4): 17.2 mg via ORAL
  Filled 2021-06-14 (×5): qty 2

## 2021-06-14 MED ORDER — IOHEXOL 350 MG/ML SOLN
60.0000 mL | Freq: Once | INTRAVENOUS | Status: AC | PRN
  Administered 2021-06-14: 60 mL via INTRAVENOUS

## 2021-06-14 MED ORDER — ACETAMINOPHEN 325 MG PO TABS: 650.0000 mg | ORAL_TABLET | ORAL | Status: DC | PRN

## 2021-06-14 MED ORDER — VITAMIN B-12 1000 MCG PO TABS
5000.0000 ug | ORAL_TABLET | Freq: Every day | ORAL | Status: DC
  Administered 2021-06-15 – 2021-06-16 (×2): 5000 ug via ORAL
  Filled 2021-06-14 (×2): qty 5

## 2021-06-14 MED ORDER — PRAVASTATIN SODIUM 40 MG PO TABS: 20.0000 mg | ORAL_TABLET | Freq: Every day | ORAL | Status: DC

## 2021-06-14 MED ORDER — LANADELUMAB-FLYO 300 MG/2ML ~~LOC~~ SOSY: 300.0000 mg | PREFILLED_SYRINGE | SUBCUTANEOUS | Status: DC

## 2021-06-14 MED ORDER — SODIUM CHLORIDE 0.9% FLUSH: 3.0000 mL | Freq: Once | INTRAVENOUS | Status: DC

## 2021-06-14 MED ORDER — PANTOPRAZOLE SODIUM 40 MG PO TBEC
40.0000 mg | DELAYED_RELEASE_TABLET | Freq: Two times a day (BID) | ORAL | Status: DC
  Administered 2021-06-15 – 2021-06-16 (×4): 40 mg via ORAL
  Filled 2021-06-14 (×5): qty 1

## 2021-06-14 MED ORDER — SODIUM CHLORIDE 0.9 % IV SOLN: INTRAVENOUS | Status: DC

## 2021-06-14 MED ORDER — TRAZODONE HCL 50 MG PO TABS
25.0000 mg | ORAL_TABLET | Freq: Every evening | ORAL | Status: DC | PRN
  Administered 2021-06-15: 25 mg via ORAL
  Filled 2021-06-14 (×2): qty 1

## 2021-06-14 MED ORDER — ENOXAPARIN SODIUM 40 MG/0.4ML IJ SOSY
40.0000 mg | PREFILLED_SYRINGE | INTRAMUSCULAR | Status: DC
  Administered 2021-06-14 – 2021-06-15 (×2): 40 mg via SUBCUTANEOUS
  Filled 2021-06-14 (×2): qty 0.4

## 2021-06-14 MED ORDER — LEVOTHYROXINE SODIUM 100 MCG PO TABS
100.0000 ug | ORAL_TABLET | Freq: Every day | ORAL | Status: DC
  Administered 2021-06-15 – 2021-06-16 (×2): 100 ug via ORAL
  Filled 2021-06-14 (×2): qty 1

## 2021-06-14 MED ORDER — CLOPIDOGREL BISULFATE 75 MG PO TABS: 75.0000 mg | ORAL_TABLET | Freq: Every day | ORAL | Status: DC

## 2021-06-14 MED ORDER — SENNOSIDES-DOCUSATE SODIUM 8.6-50 MG PO TABS: 1.0000 | ORAL_TABLET | Freq: Every evening | ORAL | Status: DC | PRN

## 2021-06-14 MED ORDER — METOPROLOL SUCCINATE ER 25 MG PO TB24
25.0000 mg | ORAL_TABLET | Freq: Every day | ORAL | Status: DC
  Administered 2021-06-15 – 2021-06-16 (×2): 25 mg via ORAL
  Filled 2021-06-14 (×2): qty 1

## 2021-06-14 MED ORDER — ASPIRIN 300 MG RE SUPP: 300.0000 mg | Freq: Every day | RECTAL | Status: DC

## 2021-06-14 MED ORDER — ACETAMINOPHEN 160 MG/5ML PO SOLN: 650.0000 mg | ORAL | Status: DC | PRN

## 2021-06-14 NOTE — ED Notes (Signed)
Patient transported to MRI 

## 2021-06-14 NOTE — ED Provider Triage Note (Signed)
Emergency Medicine Provider Triage Evaluation Note ? ?Daniel Herrera , a 81 y.o. male  was evaluated in triage.  Pt complains of sensation that he is leaning to the left and disequilibrium since yesterday at 5 PM.  Last known well was 5 PM yesterday.  NIH of 0 at this time. ?Review of Systems  ?Positive: Disequilibrium ?Negative: Chest pain or shortness of breath, anticoagulated ? ?Physical Exam  ?BP (!) 191/106   Pulse 65   Temp 97.9 ?F (36.6 ?C) (Oral)   Resp 16   SpO2 98%  ?Gen:   Awake, no distress   ?Resp:  Normal effort  ?MSK:   Moves extremities without difficulty  ?Other:  Positive Romberg.  Neurologic exam otherwise nonfocal.  Cranial nerves are intact.  Symmetric strength sensation upper and lower extremities bilaterally.  No pronator drift. ? ?Medical Decision Making  ?Medically screening exam initiated at 4:40 PM.  Appropriate orders placed.  PATSY VARMA was informed that the remainder of the evaluation will be completed by another provider, this initial triage assessment does not replace that evaluation, and the importance of remaining in the ED until their evaluation is complete. ? ?Stroke work-up initiated. ? ?This chart was dictated using voice recognition software, Dragon. Despite the best efforts of this provider to proofread and correct errors, errors may still occur which can change documentation meaning. ? ?  ?Emeline Darling, PA-C ?06/14/21 1642 ? ?

## 2021-06-14 NOTE — Consult Note (Signed)
NEUROLOGY CONSULTATION NOTE  ? ?Date of service: June 14, 2021 ?Patient Name: Daniel Herrera ?MRN:  [id number] ?DOB:  23-Jan-1941 ?Reason for consult: "cerbellum stroke" ?Requesting Provider: Christel Mormon, MD ?_ _ _   _ __   _ __ _ _  __ __   _ __   __ _ ? ?History of Present Illness  ?Daniel Herrera is a 81 y.o. male with PMH significant for CAD, HLD, GERD, NSTEMI, hx of GI bleed, pAfibb not on AC due to prior GI bleed, peripheral vascular disease, DM2, BPH who presents with unsteady gait that started on 06/12/21 at 1700. ? ?He got up from chair and was walking in the hallway at his home when he realized he was clumsy on he left side and kept running into the wall. This went away but returned this AM so he called his family and eventually brought in to urgent care and was transferred to ED from there. ? ?He had MRI Brain without contrast which demonstrated acute and some subacute L PICA strokes. ? ?LKW: 1700 on 06/12/21. ?mRS: 0 ?tNKASE: outside window ?Thrombectomy: outside window and no syemptoms at this time. ?NIHSS components Score: Comment  ?1a Level of Conscious 0'[x]'$  1'[]'$  2'[]'$  3'[]'$      ?1b LOC Questions 0'[x]'$  1'[]'$  2'[]'$       ?1c LOC Commands 0'[x]'$  1'[]'$  2'[]'$       ?2 Best Gaze 0'[x]'$  1'[]'$  2'[]'$       ?3 Visual 0'[x]'$  1'[]'$  2'[]'$  3'[]'$      ?4 Facial Palsy 0'[x]'$  1'[]'$  2'[]'$  3'[]'$      ?5a Motor Arm - left 0'[x]'$  1'[]'$  2'[]'$  3'[]'$  4'[]'$  UN'[]'$    ?5b Motor Arm - Right 0'[x]'$  1'[]'$  2'[]'$  3'[]'$  4'[]'$  UN'[]'$    ?6a Motor Leg - Left 0'[x]'$  1'[]'$  2'[]'$  3'[]'$  4'[]'$  UN'[]'$    ?6b Motor Leg - Right 0'[x]'$  1'[]'$  2'[]'$  3'[]'$  4'[]'$  UN'[]'$    ?7 Limb Ataxia 0'[x]'$  1'[]'$  2'[]'$  3'[]'$  UN'[]'$     ?8 Sensory 0'[x]'$  1'[]'$  2'[]'$  UN'[]'$      ?9 Best Language 0'[x]'$  1'[]'$  2'[]'$  3'[]'$      ?10 Dysarthria 0'[x]'$  1'[]'$  2'[]'$  UN'[]'$      ?11 Extinct. and Inattention 0'[x]'$  1'[]'$  2'[]'$       ?TOTAL: 0   ? ?  ?ROS  ? ?Constitutional Denies weight loss, fever and chills.   ?HEENT Denies changes in vision and hearing.   ?Respiratory Denies SOB and cough.   ?CV Denies palpitations and CP   ?GI Denies abdominal pain, nausea, vomiting and diarrhea.   ?GU Denies dysuria  and urinary frequency.   ?MSK Denies myalgia and joint pain.   ?Skin Denies rash and pruritus.   ?Neurological Denies headache and syncope.   ?Psychiatric Denies recent changes in mood. Denies anxiety and depression.   ? ?Past History  ? ?Past Medical History:  ?Diagnosis Date  ? Abnormal EKG 02/21/2015  ? Acute lower GI bleeding 05/28/2019  ? Anginal pain ([place]) 2016  ? Angio-edema   ? Anxiety   ? Arthritis   ? CAD (coronary artery disease)   ? a. 02/2015: DES to mid-LAD  ? CAD S/P percutaneous coronary angioplasty   ? NSTEMI treated with LAD PCI with DES x 03 Feb 2015 Myoview low risk Nov 2017  ? Cataract   ? CHF (congestive heart failure) ([place]) 2020  ? after surgery only  ? Chronic diarrhea of unknown origin   ? CKD (chronic kidney disease) stage 3, GFR 30-59 ml/min 12/21/2017  ? Colitis 05/23/2015  ? Diverticular hemorrhage   ?  Diverticulosis   ? Dyslipidemia, goal LDL below 70 02/21/2015  ? hyperlipidemia   ? Dysrhythmia   ? A-fib  ? Enterotoxigenic Escherichia coli infection 05/24/2015  ? Essential hypertension 02/21/2015  ? Geographic tongue   ? GERD (gastroesophageal reflux disease)   ? GI bleed 12/21/2017  ? Glossitis, benign migratory 12/04/2018  ? Gout   ? Hematochezia 12/21/2017  ? Admitted Sept 2019 with lower GI bleeding felt to be secondary to hemorrhoids  ? Hereditary angioedema type 2 ([place]) 07/28/2017  ? High cholesterol   ? History of kidney stones   ? History of non-ST elevation myocardial infarction (NSTEMI) 02/22/2015  ? Nov 2016  ? History of prolonged Q-T interval on ECG   ? History of prostate cancer   ? Hoarseness 12/04/2018  ? Hx of Clostridium difficile infection   ? Hx of umbilical hernia repair   ? Hypertension   ? Hypothyroidism   ? ILD (interstitial lung disease) ([place]) 12/17/2013  ? ?related to chicken farm exposure or aspiration pneumonitis from GERD Previously attributed to acute interstitial pneumonitis but based on HRCT this is likely IPF, "probable UIP"  ? Interstitial lung disease ([place])   ?  Lower GI bleed 06/03/2019  ? Malignant neoplasm of prostate ([place]) 08/07/2014  ? Myocardial infarction [facility]) 2016  ? NSTEMI (non-ST elevated myocardial infarction) ([place]) 05/21/2018  ? Open metatarsal fracture 09/02/2020  ? PAF (paroxysmal atrial fibrillation) ([place]) 06/13/2018  ? Post CABG- discharged on Amiodarone, he will probably not need this long term  ? Palpitations 03/15/2015  ? palpitations   ? Peripheral vascular disease ([place])   ? vericose veins  ? Personal history of digestive disease   ? gastric ulcer  ? Pre-diabetes   ? Pre-operative clearance 01/02/2018  ? Prostate cancer ([place])   ? Prostate enlargement   ? S/P CABG x 3 05/28/2018  ?  LIMA-LAD, RIMA-RCA, and Lt radial to OM.  ? Skin cancer   ? basal cell carcinoma  ? STEC (Shiga toxin-producing Escherichia coli) infection 05/24/2015  ? Tachycardia determined by examination of pulse   ? Thyroid disease   ? Type 2 diabetes mellitus with unspecified complications (HCC)   ? Urticaria   ? ?Past Surgical History:  ?Procedure Laterality Date  ? APPENDECTOMY    ? BACK SURGERY    ? CARDIAC CATHETERIZATION N/A 02/21/2015  ? Procedure: Left Heart Cath;  Surgeon: Lorretta Harp, MD;  Location: [place] [facility];  Service: Cardiovascular;  Laterality: N/A;  ? CATARACT EXTRACTION, BILATERAL    ? CORONARY ARTERY BYPASS GRAFT N/A 05/24/2018  ? Procedure: CORONARY ARTERY BYPASS GRAFTING (CABG), ON PUMP, TIMES THREE, USING BILATERAL INTERNAL MAMMARY ARTERY AND HARVESTED LEFT RADIAL ARTERY;  Surgeon: Gaye Pollack, MD;  Location: [place] [facility];  Service: Open Heart Surgery;  Laterality: N/A;  ? CYSTOSCOPY WITH LITHOLAPAXY N/A 07/26/2020  ? Procedure: CYSTOSCOPY WITH LITHOLAPAXY WITH FULGERATION;  Surgeon: Raynelle Bring, MD;  [facility];  Service: Urology;  Laterality: N/A;  ? HOLMIUM LASER APPLICATION N/A 6/78/9381  ? Procedure: HOLMIUM LASER APPLICATION;  Surgeon: Raynelle Bring, MD;  [facility];  Service: Urology;  Laterality: N/A;  ? I & D EXTREMITY Right 09/02/2020  ?  Procedure: IRRIGATION AND DEBRIDEMENT RIGHT 2ND AND 3RD METATARSAL;  Surgeon: Wylene Simmer, MD;  Location: [place];  Service: Orthopedics;  Laterality: Right;  ? LEFT HEART CATH AND CORONARY ANGIOGRAPHY N/A 05/22/2018  ? Procedure: LEFT HEART CATH AND CORONARY ANGIOGRAPHY;  Surgeon: Jettie Booze, MD;  Location: [place] [facility];  Service: Cardiovascular;  Laterality: N/A;  ? NASAL SINUS SURGERY    ? ORIF TOE FRACTURE Right 09/02/2020  ? Procedure: OPEN REDUCTION INTERNAL FIXATION (ORIF) OF RIGHT OPEN 2ND AND 3RD METATARSAL (TOE) OPEN FRACTURE;  Surgeon: Wylene Simmer, MD;  Location: [place];  Service: Orthopedics;  Laterality: Right;  ? PROSTATE BIOPSY    ? RADIAL ARTERY HARVEST Left 05/24/2018  ? Procedure: RADIAL ARTERY HARVEST;  Surgeon: Gaye Pollack, MD;  Location: [place];  Service: Open Heart Surgery;  Laterality: Left;  ? right shoulder rotator cuff repair    ? TEE WITHOUT CARDIOVERSION N/A 05/24/2018  ? Procedure: TRANSESOPHAGEAL ECHOCARDIOGRAM (TEE);  Surgeon: Gaye Pollack, MD;  Location: [place];  Service: Open Heart Surgery;  Laterality: N/A;  ? ?Family History  ?Problem Relation Age of Onset  ? Cancer Mother   ? Stomach cancer Mother   ? Cancer Father   ?     prostate  ? Cancer Sister   ? ?Social History  ? ?Socioeconomic History  ? Marital status: Married  ?  Spouse name: Not on file  ? Number of children: Not on file  ? Years of education: Not on file  ? Highest education level: Not on file  ?Occupational History  ? Occupation: retired  ?Tobacco Use  ? Smoking status: Never  ? Smokeless tobacco: Never  ?Vaping Use  ? Vaping Use: Never used  ?Substance and Sexual Activity  ? Alcohol use: Never  ? Drug use: No  ? Sexual activity: Not on file  ?Other Topics Concern  ? Not on file  ?Social History Narrative  ? Not on file  ? ?Social Determinants of Health  ? ?Financial Resource Strain: Not on file  ?Food Insecurity: Not on file  ?Transportation Needs: Not on file  ?Physical Activity: Not on file   ?Stress: Not on file  ?Social Connections: Not on file  ? ?Allergies  ?Allergen Reactions  ? Lisinopril Anaphylaxis, Swelling and Other (See Comments)  ?  Face, lips, and throat swell  ? Oxycodone Other (See Comments

## 2021-06-14 NOTE — H&P (Signed)
?  ?  ? ? ? ?PATIENT NAME: Daniel Herrera   ? ?MR#:  [id number] ? ?DATE OF BIRTH:  22-May-1940 ? ?DATE OF ADMISSION:  06/14/2021 ? ?PRIMARY CARE PHYSICIAN: [name], [name], NP  ? ?Patient is coming from: Home ? ?REQUESTING/REFERRING PHYSICIAN: Lajean Saver, MD ? ?CHIEF COMPLAINT:  ? ?Chief Complaint  ?Patient presents with  ? Dizziness  ? ? ?HISTORY OF PRESENT ILLNESS:  ?Daniel Herrera is a 81 y.o. male with medical history significant for cardiac disease status post PCI and stent, CHF, stage III CKD, diverticulosis, dyslipidemia, GERD, hypertension, urolithiasis, who presented to the ER with acute onset of dizziness and imbalance with a feeling of leaning to the left without paresthesias.  He denies any dysarthria or dysphagia.  No facial droop.  No chest pain or palpitations.  No tinnitus or vertigo.  He denied any fever or chills.  No nausea vomiting or abdominal pain.  No urinary or stool incontinence.  No witnessed seizures.  He denies any presyncope or syncope. ? ?ED Course: When he came to the ER, BP was 191/106 and later 174/103.  Labs revealed a creatinine of 1.6 close to baseline with otherwise unremarkable BMP.  CBC was within normal.  Influenza antigens and COVID-19 PCR came back negative.  ?EKG as reviewed by me : EKG showed sinus rhythm rate of 63 with first-degree AV block and moderate force criteria for LVH with poor R wave progression.   ?Imaging:  Noncontrasted CT scan revealed acute on chronic left maxillary sinusitis with no acute intracranial abnormality. ?Brain MRI without contrast revealed the following: ?Acute and/or subacute infarcts in the left cerebellum, predominantly ?in the left PICA territory. In addition there is loss of the left ?vertebral artery flow void, which may indicate slow flow or ?occlusion and a possible hyperdense left vertebral artery on the ?same-day CT. A CTA head neck is recommended for further evaluation. ? ?Head and neck CTA revealed the following: ?1. The  left vertebral artery is patent proximally, with gradual ?diminution in enhancement throughout the V2 segment, non enhancement ?of the V3 segment, and complete non enhancement of the V4 segment ?with the exception of the most distal part, which demonstrates ?retrograde enhancement. The nonenhancing portion of V4 includes the ?origin of the left PICA, which demonstrates minimal, likely ?retrograde, enhancement. Given the acute left PICA territory ?infarcts, this non opacification of the left vertebral artery may be ?acute. Consider neuro interventional consult. ?2. No other hemodynamically significant stenosis in the neck. ?3. Severe focal narrowing in the proximal left P2 and moderate ?narrowing in the right P2. ?4. Mild focal narrowing in the left A2 and distal right V4. ?5. Redemonstrated hypodensity in the left cerebellum, which ?correlates with areas of restricted diffusion on the same-day MRI. ? ?The patient was given 1 L bolus of IV normal saline as well as aspirin and Plavix.  Hemoglobin noted to a medical  telemetry for further evaluation and management. ?PAST MEDICAL HISTORY:  ? ?Past Medical History:  ?Diagnosis Date  ? Abnormal EKG 02/21/2015  ? Acute lower GI bleeding 05/28/2019  ? Anginal pain ([place]) 2016  ? Angio-edema   ? Anxiety   ? Arthritis   ? CAD (coronary artery disease)   ? a. 02/2015: DES to mid-LAD  ? CAD S/P percutaneous coronary angioplasty   ? NSTEMI treated with LAD PCI with DES x 03 Feb 2015 Myoview low risk Nov 2017  ? Cataract   ? CHF (congestive heart failure) ([place]) 2020  ?  after surgery only  ? Chronic diarrhea of unknown origin   ? CKD (chronic kidney disease) stage 3, GFR 30-59 ml/min 12/21/2017  ? Colitis 05/23/2015  ? Diverticular hemorrhage   ? Diverticulosis   ? Dyslipidemia, goal LDL below 70 02/21/2015  ? hyperlipidemia   ? Dysrhythmia   ? A-fib  ? Enterotoxigenic Escherichia coli infection 05/24/2015  ? Essential hypertension 02/21/2015  ? Geographic tongue   ? GERD  (gastroesophageal reflux disease)   ? GI bleed 12/21/2017  ? Glossitis, benign migratory 12/04/2018  ? Gout   ? Hematochezia 12/21/2017  ? Admitted Sept 2019 with lower GI bleeding felt to be secondary to hemorrhoids  ? Hereditary angioedema type 2 ([place]) 07/28/2017  ? High cholesterol   ? History of kidney stones   ? History of non-ST elevation myocardial infarction (NSTEMI) 02/22/2015  ? Nov 2016  ? History of prolonged Q-T interval on ECG   ? History of prostate cancer   ? Hoarseness 12/04/2018  ? Hx of Clostridium difficile infection   ? Hx of umbilical hernia repair   ? Hypertension   ? Hypothyroidism   ? ILD (interstitial lung disease) ([place]) 12/17/2013  ? ?related to chicken farm exposure or aspiration pneumonitis from GERD Previously attributed to acute interstitial pneumonitis but based on HRCT this is likely IPF, "probable UIP"  ? Interstitial lung disease ([place])   ? Lower GI bleed 06/03/2019  ? Malignant neoplasm of prostate ([place]) 08/07/2014  ? Myocardial infarction [facility]) 2016  ? NSTEMI (non-ST elevated myocardial infarction) ([place]) 05/21/2018  ? Open metatarsal fracture 09/02/2020  ? PAF (paroxysmal atrial fibrillation) ([place]) 06/13/2018  ? Post CABG- discharged on Amiodarone, he will probably not need this long term  ? Palpitations 03/15/2015  ? palpitations   ? Peripheral vascular disease ([place])   ? vericose veins  ? Personal history of digestive disease   ? gastric ulcer  ? Pre-diabetes   ? Pre-operative clearance 01/02/2018  ? Prostate cancer ([place])   ? Prostate enlargement   ? S/P CABG x 3 05/28/2018  ?  LIMA-LAD, RIMA-RCA, and Lt radial to OM.  ? Skin cancer   ? basal cell carcinoma  ? STEC (Shiga toxin-producing Escherichia coli) infection 05/24/2015  ? Tachycardia determined by examination of pulse   ? Thyroid disease   ? Type 2 diabetes mellitus with unspecified complications (HCC)   ? Urticaria   ? ? ?PAST SURGICAL HISTORY:  ? ?Past Surgical History:  ?Procedure Laterality Date  ? APPENDECTOMY    ? BACK SURGERY    ?  CARDIAC CATHETERIZATION N/A 02/21/2015  ? Procedure: Left Heart Cath;  Surgeon: Lorretta Harp, MD;  Location: [place] [facility];  Service: Cardiovascular;  Laterality: N/A;  ? CATARACT EXTRACTION, BILATERAL    ? CORONARY ARTERY BYPASS GRAFT N/A 05/24/2018  ? Procedure: CORONARY ARTERY BYPASS GRAFTING (CABG), ON PUMP, TIMES THREE, USING BILATERAL INTERNAL MAMMARY ARTERY AND HARVESTED LEFT RADIAL ARTERY;  Surgeon: Gaye Pollack, MD;  Location: [place] [facility];  Service: Open Heart Surgery;  Laterality: N/A;  ? CYSTOSCOPY WITH LITHOLAPAXY N/A 07/26/2020  ? Procedure: CYSTOSCOPY WITH LITHOLAPAXY WITH FULGERATION;  Surgeon: Raynelle Bring, MD;  Location: [facility];  Service: Urology;  Laterality: N/A;  ? HOLMIUM LASER APPLICATION N/A 12/04/4095  ? Procedure: HOLMIUM LASER APPLICATION;  Surgeon: Raynelle Bring, MD;  Location: [facility];  Service: Urology;  Laterality: N/A;  ? I & D EXTREMITY Right 09/02/2020  ? Procedure: IRRIGATION AND DEBRIDEMENT RIGHT 2ND AND 3RD METATARSAL;  Surgeon: Wylene Simmer, MD;  Location: [place];  Service: Orthopedics;  Laterality: Right;  ? LEFT HEART CATH AND CORONARY ANGIOGRAPHY N/A 05/22/2018  ? Procedure: LEFT HEART CATH AND CORONARY ANGIOGRAPHY;  Surgeon: Jettie Booze, MD;  Location: [place] [facility];  Service: Cardiovascular;  Laterality: N/A;  ? NASAL SINUS SURGERY    ? ORIF TOE FRACTURE Right 09/02/2020  ? Procedure: OPEN REDUCTION INTERNAL FIXATION (ORIF) OF RIGHT OPEN 2ND AND 3RD METATARSAL (TOE) OPEN FRACTURE;  Surgeon: Wylene Simmer, MD;  Location: [place];  Service: Orthopedics;  Laterality: Right;  ? PROSTATE BIOPSY    ? RADIAL ARTERY HARVEST Left 05/24/2018  ? Procedure: RADIAL ARTERY HARVEST;  Surgeon: Gaye Pollack, MD;  Location: [place];  Service: Open Heart Surgery;  Laterality: Left;  ? right shoulder rotator cuff repair    ? TEE WITHOUT CARDIOVERSION N/A 05/24/2018  ? Procedure: TRANSESOPHAGEAL ECHOCARDIOGRAM (TEE);  Surgeon: Gaye Pollack, MD;  Location: [place];  Service: Open Heart  Surgery;  Laterality: N/A;  ? ? ?SOCIAL HISTORY:  ? ?Social History  ? ?Tobacco Use  ? Smoking status: Never  ? Smokeless tobacco: Never  ?Substance Use Topics  ? Alcohol use: Never  ? ? ?FAMILY HISTORY:  ? ?Family His

## 2021-06-14 NOTE — ED Provider Notes (Addendum)
[facility] EMERGENCY DEPARTMENT Provider Note   CSN: [id number] Arrival date & time: 06/14/21  1546     History  Chief Complaint  Patient presents with   Dizziness    Daniel Herrera is a 81 y.o. male.  Pt with sense of balance problem, unsteady gait, beginning last pm around 5 pm. Symptoms acute onset, moderate, persistent. Seemed better today but not back to normal/baseline. Denies change in speech or vision. No numbness/weakness. No room spinning. No uri symptoms. No congestion. No ear pain or hearing loss. No tinnitus. No headache. No fall. No cp or sob. No abd pain or nvd. No blood loss. No change in meds. No fever or chills.   The history is provided by the patient and medical records.  Dizziness Associated symptoms: no blood in stool, no chest pain, no diarrhea, no headaches, no shortness of breath, no vomiting and no weakness       Home Medications Prior to Admission medications   Medication Sig Start Date End Date Taking? Authorizing Provider  aspirin EC 81 MG tablet Take 81 mg by mouth daily. Swallow whole.    [provider]  Azelastine HCl 137 MCG/SPRAY SOLN Place 1 spray into both nostrils 2 (two) times daily. 02/05/21   [provider]  betamethasone valerate ointment (VALISONE) 0.1 % Apply 1 application topically 2 (two) times daily as needed for rash. 01/18/21   [provider]  Cyanocobalamin (VITAMIN B-12) 5000 MCG TBDP Take 5,000 mcg by mouth daily.     [provider]  dextromethorphan-guaiFENesin (MUCINEX DM) 30-600 MG 12hr tablet Take 1 tablet by mouth in the morning and at bedtime.    [provider]  EPINEPHrine 0.3 mg/0.3 mL IJ SOAJ injection Inject 0.3 mg into the muscle as needed for anaphylaxis.    [provider]  fluoruracil (CARAC) 0.5 % cream Apply 1 application topically as needed for rash. For skin cancer flare up    [provider]  fluticasone (FLONASE) 50 MCG/ACT  nasal spray Place 1 spray into both nostrils 2 (two) times daily.    [provider]  glipiZIDE (GLUCOTROL XL) 2.5 MG 24 hr tablet Take 2.5 mg by mouth every morning. 10/21/20   [provider]  icatibant (FIRAZYR) 30 MG/3ML injection Inject 3 mLs (30 mg total) into the skin once as needed (for sudden attacks of hereditary angioedema). 03/07/21   Kozlow, Alvira Philips, MD  levothyroxine (SYNTHROID) 100 MCG tablet Take 100 mcg by mouth daily before breakfast.    [provider]  lidocaine (XYLOCAINE) 2 % solution Use as directed 15 mLs in the mouth or throat daily. 08/06/20   [provider]  metoprolol succinate (TOPROL-XL) 25 MG 24 hr tablet Take 25 mg by mouth daily. 10/24/20   [provider]  metoprolol tartrate (LOPRESSOR) 25 MG tablet Take 0.5 tablets (12.5 mg total) by mouth 2 (two) times daily. 05/29/19   Burnadette Pop, MD  nabumetone (RELAFEN) 750 MG tablet Take 750 mg by mouth 2 (two) times daily. 08/09/19   [provider]  nitroGLYCERIN (NITROSTAT) 0.4 MG SL tablet Place 0.4 mg under the tongue every 5 (five) minutes as needed for chest pain.    [provider]  NONFORMULARY OR COMPOUNDED ITEM Use as directed 5-15 mLs in the mouth or throat See admin instructions. Compounded mouth wash/gargle:1/1/1/1/1 (80 ml's of each) Purified water; Lidocaine Viscous; Nystatin suspension; Prednisolone 15/5; Maalox: Swish and spit using 5-15 ml's every 6 hours as  needed for irritation    [provider]  OVER THE COUNTER MEDICATION Take 1 capsule by mouth in the morning and at bedtime. Health Plus/Super Colon Cleanse    [provider]  pantoprazole (PROTONIX) 40 MG tablet Take 40 mg by mouth 2 (two) times daily.    [provider]  Pitavastatin Calcium (LIVALO) 4 MG TABS Take 4 mg by mouth at bedtime.    [provider]  Probiotic Product (PROBIOTIC DAILY PO) Take 1 capsule by mouth daily.    [provider]   senna (SENOKOT) 8.6 MG TABS tablet Take 2 tablets (17.2 mg total) by mouth 2 (two) times daily. 09/03/20   Jacinta Shoe, PA-C  TAKHZYRO 300 MG/2ML SOSY Inject 300 mg into the skin every 14 (fourteen) days. 10/21/20   [provider]      Allergies    Lisinopril, Oxycodone, Tape, Benadryl [diphenhydramine hcl], Darvocet [propoxyphene n-acetaminophen], Darvon [propoxyphene hcl], and Tylenol [acetaminophen]    Review of Systems   Review of Systems  Constitutional:  Negative for fever.  HENT:  Negative for trouble swallowing.   Eyes:  Negative for visual disturbance.  Respiratory:  Negative for shortness of breath.   Cardiovascular:  Negative for chest pain.  Gastrointestinal:  Negative for abdominal pain, blood in stool, diarrhea and vomiting.  Genitourinary:  Negative for dysuria and flank pain.  Musculoskeletal:  Negative for back pain and neck pain.  Skin:  Negative for rash.  Neurological:  Positive for dizziness. Negative for speech difficulty, weakness, light-headedness, numbness and headaches.  Hematological:  Does not bruise/bleed easily.  Psychiatric/Behavioral:  Negative for confusion.    Physical Exam Updated Vital Signs BP (!) 174/103 (BP Location: Right Arm)   Pulse (!) 58   Temp 97.9 F (36.6 C) (Oral)   Resp 14   SpO2 100%  Physical Exam Vitals and nursing note reviewed.  Constitutional:      Appearance: Normal appearance. He is well-developed.  HENT:     Head: Atraumatic.     Nose: Nose normal.     Mouth/Throat:     Mouth: Mucous membranes are moist.     Pharynx: Oropharynx is clear.  Eyes:     General: No scleral icterus.    Extraocular Movements: Extraocular movements intact.     Conjunctiva/sclera: Conjunctivae normal.     Pupils: Pupils are equal, round, and reactive to light.  Neck:     Vascular: No carotid bruit.     Trachea: No tracheal deviation.  Cardiovascular:     Rate and Rhythm: Normal rate and regular rhythm.     Pulses:  Normal pulses.     Heart sounds: Normal heart sounds. No murmur heard.   No friction rub. No gallop.  Pulmonary:     Effort: Pulmonary effort is normal. No accessory muscle usage or respiratory distress.     Breath sounds: Normal breath sounds.  Abdominal:     General: Bowel sounds are normal. There is no distension.     Palpations: Abdomen is soft.     Tenderness: There is no abdominal tenderness.  Genitourinary:    Comments: No cva tenderness. Musculoskeletal:        General: No swelling or tenderness.     Cervical back: Normal range of motion and neck supple. No rigidity.     Right lower leg: No edema.     Left lower leg: No edema.  Skin:    General: Skin is warm and dry.  Findings: No rash.  Neurological:     Mental Status: He is alert.     Comments: Alert, speech clear. Motor intact bil, stre 5/5. No pronator drift. Sens grossly intact. Gait mildly unsteady.   Psychiatric:        Mood and Affect: Mood normal.    ED Results / Procedures / Treatments   Labs (all labs ordered are listed, but only abnormal results are displayed) Results for orders placed or performed during the hospital encounter of 06/14/21  Protime-INR  Result Value Ref Range   Prothrombin Time 14.5 11.4 - 15.2 seconds   INR 1.1 0.8 - 1.2  APTT  Result Value Ref Range   aPTT 31 24 - 36 seconds  CBC  Result Value Ref Range   WBC 5.2 4.0 - 10.5 K/uL   RBC 4.65 4.22 - 5.81 MIL/uL   Hemoglobin 14.7 13.0 - 17.0 g/dL   HCT 62.3 76.2 - 83.1 %   MCV 92.3 80.0 - 100.0 fL   MCH 31.6 26.0 - 34.0 pg   MCHC 34.3 30.0 - 36.0 g/dL   RDW 51.7 61.6 - 07.3 %   Platelets 169 150 - 400 K/uL   nRBC 0.0 0.0 - 0.2 %  Differential  Result Value Ref Range   Neutrophils Relative % 71 %   Neutro Abs 3.7 1.7 - 7.7 K/uL   Lymphocytes Relative 19 %   Lymphs Abs 1.0 0.7 - 4.0 K/uL   Monocytes Relative 8 %   Monocytes Absolute 0.4 0.1 - 1.0 K/uL   Eosinophils Relative 1 %   Eosinophils Absolute 0.1 0.0 - 0.5 K/uL    Basophils Relative 1 %   Basophils Absolute 0.0 0.0 - 0.1 K/uL   Immature Granulocytes 0 %   Abs Immature Granulocytes 0.02 0.00 - 0.07 K/uL  Comprehensive metabolic panel  Result Value Ref Range   Sodium 136 135 - 145 mmol/L   Potassium 4.0 3.5 - 5.1 mmol/L   Chloride 102 98 - 111 mmol/L   CO2 25 22 - 32 mmol/L   Glucose, Bld 131 (H) 70 - 99 mg/dL   BUN 14 8 - 23 mg/dL   Creatinine, Ser 7.10 (H) 0.61 - 1.24 mg/dL   Calcium 9.1 8.9 - 62.6 mg/dL   Total Protein 6.4 (L) 6.5 - 8.1 g/dL   Albumin 3.9 3.5 - 5.0 g/dL   AST 23 15 - 41 U/L   ALT 22 0 - 44 U/L   Alkaline Phosphatase 70 38 - 126 U/L   Total Bilirubin 0.5 0.3 - 1.2 mg/dL   GFR, Estimated 46 (L) >60 mL/min   Anion gap 9 5 - 15  I-stat chem 8, ED  Result Value Ref Range   Sodium 138 135 - 145 mmol/L   Potassium 4.1 3.5 - 5.1 mmol/L   Chloride 102 98 - 111 mmol/L   BUN 16 8 - 23 mg/dL   Creatinine, Ser 9.48 (H) 0.61 - 1.24 mg/dL   Glucose, Bld 546 (H) 70 - 99 mg/dL   Calcium, Ion 2.70 3.50 - 1.40 mmol/L   TCO2 28 22 - 32 mmol/L   Hemoglobin 14.6 13.0 - 17.0 g/dL   HCT 09.3 81.8 - 29.9 %   CT HEAD WO CONTRAST  Result Date: 06/14/2021 CLINICAL DATA:  Neuro deficit, acute, stroke suspected EXAM: CT HEAD WITHOUT CONTRAST TECHNIQUE: Contiguous axial images were obtained from the base of the skull through the vertex without intravenous contrast. RADIATION DOSE REDUCTION: This exam was performed according  to the departmental dose-optimization program which includes automated exposure control, adjustment of the mA and/or kV according to patient size and/or use of iterative reconstruction technique. COMPARISON:  None. FINDINGS: Brain: Mild cerebral atrophy, in keeping with patient's age. No acute intracranial abnormality. Specifically, no hemorrhage, hydrocephalus, mass lesion, acute infarction, or significant intracranial injury. Vascular: No hyperdense vessel or unexpected calcification. Skull: No acute calvarial abnormality.  Sinuses/Orbits: Mucosal thickening and air-fluid level in the left maxillary sinus. Other: None IMPRESSION: No acute intracranial abnormality. Acute on chronic left maxillary sinusitis. Electronically Signed   By: Charlett Nose M.D.   On: 06/14/2021 17:58    EKG EKG Interpretation  Date/Time:  Tuesday June 14 2021 15:53:44 EDT Ventricular Rate:  63 PR Interval:  238 QRS Duration: 100 QT Interval:  416 QTC Calculation: 425 R Axis:   -16 Text Interpretation: Sinus rhythm with 1st degree A-V block Nonspecific T wave abnormality Confirmed by Cathren Laine ([id number]) on 06/14/2021 5:43:05 PM  Radiology CT HEAD WO CONTRAST  Result Date: 06/14/2021 CLINICAL DATA:  Neuro deficit, acute, stroke suspected EXAM: CT HEAD WITHOUT CONTRAST TECHNIQUE: Contiguous axial images were obtained from the base of the skull through the vertex without intravenous contrast. RADIATION DOSE REDUCTION: This exam was performed according to the departmental dose-optimization program which includes automated exposure control, adjustment of the mA and/or kV according to patient size and/or use of iterative reconstruction technique. COMPARISON:  None. FINDINGS: Brain: Mild cerebral atrophy, in keeping with patient's age. No acute intracranial abnormality. Specifically, no hemorrhage, hydrocephalus, mass lesion, acute infarction, or significant intracranial injury. Vascular: No hyperdense vessel or unexpected calcification. Skull: No acute calvarial abnormality. Sinuses/Orbits: Mucosal thickening and air-fluid level in the left maxillary sinus. Other: None IMPRESSION: No acute intracranial abnormality. Acute on chronic left maxillary sinusitis. Electronically Signed   By: Charlett Nose M.D.   On: 06/14/2021 17:58    Procedures Procedures    Medications Ordered in ED Medications  sodium chloride flush (NS) 0.9 % injection 3 mL (3 mLs Intravenous Not Given 06/14/21 1600)    ED Course/ Medical Decision Making/ A&P                            Medical Decision Making Problems Addressed: Acute CVA (cerebrovascular accident) [facility]): acute illness or injury with systemic symptoms that poses a threat to life or bodily functions Essential hypertension: chronic illness or injury with exacerbation, progression, or side effects of treatment Uncontrolled hypertension: chronic illness or injury with exacerbation, progression, or side effects of treatment that poses a threat to life or bodily functions  Amount and/or Complexity of Data Reviewed Independent Historian:     Details: family member, additional hx External Data Reviewed: notes. Labs: ordered. Decision-making details documented in ED Course. Radiology: ordered and independent interpretation performed. Decision-making details documented in ED Course. ECG/medicine tests: ordered and independent interpretation performed. Decision-making details documented in ED Course. Discussion of management or test interpretation with external provider(s): Hospitalist md and neurology - discussed pt, imaging, labs, plan.   Risk OTC drugs. Prescription drug management. Decision regarding hospitalization.   Iv ns. Continuous pulse ox and cardiac monitoring. Labs ordered/sent. Imaging ordered.   Reviewed nursing notes and prior charts for additional history. External reports reviewed. Additional history from:  Cardiac monitor: sinus rhythm, rate 60.   Labs reviewed/interpreted by me - chem normal, wbc normal.   CT reviewed/interpreted by me - no hem.  MRI reviewed/interpreted by me - acute cva.  Cta head and neck ordered.  Recheck pt, no change in neuro exam from initial.  Additional neurochecks.   Neurology consulted, discussed pt - will see in consult - request med admit.   Hospitalists consulted for admission. Discussed pt - will admit.   CRITICAL CARE  RE: acute cerebellar cva, uncontrolled htn.  Performed by: Suzi Roots Total critical care time: 40  minutes Critical care time was exclusive of separately billable procedures and treating other patients. Critical care was necessary to treat or prevent imminent or life-threatening deterioration. Critical care was time spent personally by me on the following activities: development of treatment plan with patient and/or surrogate as well as nursing, discussions with consultants, evaluation of patient's response to treatment, examination of patient, obtaining history from patient or surrogate, ordering and performing treatments and interventions, ordering and review of laboratory studies, ordering and review of radiographic studies, pulse oximetry and re-evaluation of patient's condition.  2045, mri radiology called with cta reading - indicates may want to run by neurointerventionalist. I paged and discussed with Dr Corliss Skains - he will review imaging studies, but indicates generally will not intervene acutely, but he will review studies and make rec to admitting team.           Final Clinical Impression(s) / ED Diagnoses Final diagnoses:  None    Rx / DC Orders ED Discharge Orders     None            Cathren Laine, MD 06/14/21 2307

## 2021-06-14 NOTE — ED Triage Notes (Addendum)
Pt arrived via POV from UC for c/o dizziness and unsteady gait started yesterday at 1700. Pt's LKW was yesterday at 1700. NIH 0. Equal hand grip bilat. Pt A&Ox4. ?

## 2021-06-14 NOTE — ED Notes (Signed)
Pt able to ambulate to the bathroom with minimal assistance  ?

## 2021-06-15 ENCOUNTER — Observation Stay (HOSPITAL_COMMUNITY): Payer: Medicare Other

## 2021-06-15 DIAGNOSIS — D841 Defects in the complement system: Secondary | ICD-10-CM | POA: Diagnosis present

## 2021-06-15 DIAGNOSIS — I6389 Other cerebral infarction: Secondary | ICD-10-CM

## 2021-06-15 DIAGNOSIS — Z87442 Personal history of urinary calculi: Secondary | ICD-10-CM | POA: Diagnosis not present

## 2021-06-15 DIAGNOSIS — M1990 Unspecified osteoarthritis, unspecified site: Secondary | ICD-10-CM | POA: Diagnosis present

## 2021-06-15 DIAGNOSIS — I2510 Atherosclerotic heart disease of native coronary artery without angina pectoris: Secondary | ICD-10-CM | POA: Diagnosis present

## 2021-06-15 DIAGNOSIS — Z8546 Personal history of malignant neoplasm of prostate: Secondary | ICD-10-CM | POA: Diagnosis not present

## 2021-06-15 DIAGNOSIS — I440 Atrioventricular block, first degree: Secondary | ICD-10-CM | POA: Diagnosis present

## 2021-06-15 DIAGNOSIS — R2681 Unsteadiness on feet: Secondary | ICD-10-CM | POA: Diagnosis present

## 2021-06-15 DIAGNOSIS — I130 Hypertensive heart and chronic kidney disease with heart failure and stage 1 through stage 4 chronic kidney disease, or unspecified chronic kidney disease: Secondary | ICD-10-CM | POA: Diagnosis present

## 2021-06-15 DIAGNOSIS — N1831 Chronic kidney disease, stage 3a: Secondary | ICD-10-CM | POA: Diagnosis present

## 2021-06-15 DIAGNOSIS — Z7982 Long term (current) use of aspirin: Secondary | ICD-10-CM | POA: Diagnosis not present

## 2021-06-15 DIAGNOSIS — E1151 Type 2 diabetes mellitus with diabetic peripheral angiopathy without gangrene: Secondary | ICD-10-CM | POA: Diagnosis present

## 2021-06-15 DIAGNOSIS — E119 Type 2 diabetes mellitus without complications: Secondary | ICD-10-CM | POA: Diagnosis not present

## 2021-06-15 DIAGNOSIS — Z20822 Contact with and (suspected) exposure to covid-19: Secondary | ICD-10-CM | POA: Diagnosis present

## 2021-06-15 DIAGNOSIS — E039 Hypothyroidism, unspecified: Secondary | ICD-10-CM | POA: Diagnosis present

## 2021-06-15 DIAGNOSIS — I639 Cerebral infarction, unspecified: Secondary | ICD-10-CM | POA: Diagnosis present

## 2021-06-15 DIAGNOSIS — R29700 NIHSS score 0: Secondary | ICD-10-CM | POA: Diagnosis present

## 2021-06-15 DIAGNOSIS — K219 Gastro-esophageal reflux disease without esophagitis: Secondary | ICD-10-CM | POA: Diagnosis present

## 2021-06-15 DIAGNOSIS — M109 Gout, unspecified: Secondary | ICD-10-CM | POA: Diagnosis present

## 2021-06-15 DIAGNOSIS — I63442 Cerebral infarction due to embolism of left cerebellar artery: Secondary | ICD-10-CM | POA: Diagnosis present

## 2021-06-15 DIAGNOSIS — E7800 Pure hypercholesterolemia, unspecified: Secondary | ICD-10-CM | POA: Diagnosis present

## 2021-06-15 DIAGNOSIS — I480 Paroxysmal atrial fibrillation: Secondary | ICD-10-CM | POA: Diagnosis present

## 2021-06-15 DIAGNOSIS — J84112 Idiopathic pulmonary fibrosis: Secondary | ICD-10-CM | POA: Diagnosis present

## 2021-06-15 DIAGNOSIS — I63212 Cerebral infarction due to unspecified occlusion or stenosis of left vertebral arteries: Secondary | ICD-10-CM | POA: Diagnosis not present

## 2021-06-15 DIAGNOSIS — I10 Essential (primary) hypertension: Secondary | ICD-10-CM | POA: Diagnosis not present

## 2021-06-15 DIAGNOSIS — Z951 Presence of aortocoronary bypass graft: Secondary | ICD-10-CM | POA: Diagnosis not present

## 2021-06-15 DIAGNOSIS — Z955 Presence of coronary angioplasty implant and graft: Secondary | ICD-10-CM | POA: Diagnosis not present

## 2021-06-15 DIAGNOSIS — E1122 Type 2 diabetes mellitus with diabetic chronic kidney disease: Secondary | ICD-10-CM | POA: Diagnosis present

## 2021-06-15 DIAGNOSIS — I6502 Occlusion and stenosis of left vertebral artery: Secondary | ICD-10-CM | POA: Diagnosis present

## 2021-06-15 DIAGNOSIS — I129 Hypertensive chronic kidney disease with stage 1 through stage 4 chronic kidney disease, or unspecified chronic kidney disease: Secondary | ICD-10-CM | POA: Diagnosis present

## 2021-06-15 HISTORY — DX: Hypothyroidism, unspecified: E03.9

## 2021-06-15 HISTORY — DX: Type 2 diabetes mellitus without complications: E11.9

## 2021-06-15 HISTORY — DX: Gastro-esophageal reflux disease without esophagitis: K21.9

## 2021-06-15 HISTORY — DX: Cerebral infarction, unspecified: I63.9

## 2021-06-15 LAB — GLUCOSE, CAPILLARY
Glucose-Capillary: 116 mg/dL — ABNORMAL HIGH (ref 70–99)
Glucose-Capillary: 133 mg/dL — ABNORMAL HIGH (ref 70–99)
Glucose-Capillary: 90 mg/dL (ref 70–99)
Glucose-Capillary: 109 mg/dL — ABNORMAL HIGH (ref 70–99)

## 2021-06-15 LAB — LIPID PANEL
Cholesterol: 156 mg/dL (ref 0–200)
HDL: 30 mg/dL — ABNORMAL LOW (ref >40)
LDL Cholesterol: 90 mg/dL (ref 0–99)
Total CHOL/HDL Ratio: 5.2 RATIO
Triglycerides: 180 mg/dL — ABNORMAL HIGH (ref <150)
VLDL: 36 mg/dL (ref 0–40)

## 2021-06-15 LAB — ECHOCARDIOGRAM COMPLETE
Area-P 1/2: 2.83 cm2
Calc EF: 55.2 %
S' Lateral: 2.2 cm
Single Plane A2C EF: 55.4 %
Single Plane A4C EF: 58.6 %

## 2021-06-15 MED ORDER — EZETIMIBE 10 MG PO TABS
10.0000 mg | ORAL_TABLET | Freq: Every day | ORAL | Status: DC
  Administered 2021-06-16: 10 mg via ORAL
  Filled 2021-06-15: qty 1

## 2021-06-15 MED ORDER — ASPIRIN EC 325 MG PO TBEC
325.0000 mg | DELAYED_RELEASE_TABLET | Freq: Every day | ORAL | Status: DC
  Administered 2021-06-15 – 2021-06-16 (×2): 325 mg via ORAL
  Filled 2021-06-15 (×2): qty 1

## 2021-06-15 MED ORDER — INSULIN ASPART 100 UNIT/ML IJ SOLN: 0.0000 [IU] | Freq: Every day | INTRAMUSCULAR | Status: DC

## 2021-06-15 MED ORDER — INSULIN ASPART 100 UNIT/ML IJ SOLN
0.0000 [IU] | Freq: Three times a day (TID) | INTRAMUSCULAR | Status: DC
  Administered 2021-06-15: 1 [IU] via SUBCUTANEOUS

## 2021-06-15 MED ORDER — PRAVASTATIN SODIUM 40 MG PO TABS
40.0000 mg | ORAL_TABLET | Freq: Every day | ORAL | Status: DC
Start: 2021-06-15 — End: 2021-06-16
  Administered 2021-06-15: 40 mg via ORAL
  Filled 2021-06-15: qty 1

## 2021-06-15 NOTE — Assessment & Plan Note (Signed)
-   We will continue Synthroid. 

## 2021-06-15 NOTE — Progress Notes (Signed)
?  Echocardiogram ?2D Echocardiogram has been performed. ? ?Daniel Herrera ?06/15/2021, 10:54 AM ?

## 2021-06-15 NOTE — Assessment & Plan Note (Addendum)
-   The patient will be admitted to an observation medical telemetry. ?- His infarction is in the left PICA territory and he has left vertebral stenosis. ?- We will follow neurochecks every 4 hours for 24 hours. ?- He will be placed on aspirin and Plavix. ?- PT/OT and ST consults will be obtained. ?- Neurology consult was obtained by Dr. Lorrin Goodell and neurology team will be following.  He does not think the patient needs urgent neuro interventional consult as he barely has any symptoms at this time. ?- He will be on statin therapy and will check his fasting lipids. ?- We will maintain his IV fluids at 150 mill per hour for a couple of days per neurology recommendation. ?

## 2021-06-15 NOTE — [place] FL2 (Signed)
?[place] MEDICAID FL2 LEVEL OF CARE SCREENING TOOL  ?  ? ?IDENTIFICATION  ?Patient Name: ?Daniel Herrera Birthdate: 1940-11-20 Sex: male Admission Date (Current Location): ?06/14/2021  ?[place] and [place] Number: ? [place] ?  Facility and Address:  ?[facility], [place] [address], [place], [place] [address] ?     Provider Number: ?[id number]  ?Attending Physician Name and Address:  ?Barb Merino, MD ? Relative Name and Phone Number:  ?  ?   ?Current Level of Care: ?Hospital Recommended Level of Care: ?[place] Prior Approval Number: ?  ? ?Date Approved/Denied: ?  PASRR Number: ?[id number] A ? ?Discharge Plan: ?SNF ?  ? ?Current Diagnoses: ?Patient Active Problem List  ? Diagnosis Date Noted  ? Hypothyroidism, unspecified 06/15/2021  ? GERD without esophagitis 06/15/2021  ? Type 2 diabetes mellitus without complications ([place]) 70/62/3762  ? Cerebellar stroke ([place]) 06/14/2021  ? Open metatarsal fracture 09/02/2020  ? Dysrhythmia   ? Geographic tongue   ? History of kidney stones   ? Hypothyroidism   ? Interstitial lung disease ([place])   ? Peripheral vascular disease ([place])   ? Pre-diabetes   ? Urticaria   ? Thyroid disease   ? Skin cancer   ? Prostate enlargement   ? Prostate cancer ([place])   ? Personal history of digestive disease   ? Myocardial infarction [facility])   ? Hypertension   ? Hx of umbilical hernia repair   ? Hx of Clostridium difficile infection   ? History of prolonged Q-T interval on ECG   ? High cholesterol   ? Gout   ? GERD (gastroesophageal reflux disease)   ? Arthritis   ? Anxiety   ? Angio-edema   ? Cataract   ? Diverticular hemorrhage   ? Lower GI bleed 06/03/2019  ? Acute lower GI bleeding 05/28/2019  ? Coronary artery disease 05/28/2019  ? Diverticulosis   ? Glossitis, benign migratory 12/04/2018  ? Hoarseness 12/04/2018  ? PAF (paroxysmal atrial fibrillation) ([place]) 06/13/2018  ? S/P CABG x 3 05/28/2018  ? NSTEMI (non-ST elevated myocardial infarction) ([place])  05/21/2018  ? Tachycardia determined by examination of pulse   ? CHF (congestive heart failure) ([place]) 2020  ? Pre-operative clearance 01/02/2018  ? Hematochezia 12/21/2017  ? CKD (chronic kidney disease) stage 3, GFR 30-59 ml/min 12/21/2017  ? GI bleed 12/21/2017  ? Hereditary angioedema type 2 ([place]) 07/28/2017  ? Enterotoxigenic Escherichia coli infection 05/24/2015  ? STEC (Shiga toxin-producing Escherichia coli) infection 05/24/2015  ? Colitis 05/23/2015  ? History of prostate cancer   ? Chronic diarrhea of unknown origin   ? CAD S/P percutaneous coronary angioplasty   ? Type 2 diabetes mellitus with unspecified complications (HCC)   ? Palpitations 03/15/2015  ? History of non-ST elevation myocardial infarction (NSTEMI) 02/22/2015  ? Abnormal EKG 02/21/2015  ? Essential hypertension 02/21/2015  ? Dyslipidemia, goal LDL below 70 02/21/2015  ? Malignant neoplasm of prostate ([place]) 08/07/2014  ? Anginal pain ([place]) 2016  ? ILD (interstitial lung disease) ([place]) 12/17/2013  ? ? ?Orientation RESPIRATION BLADDER Height & Weight   ?  ?Self, Time, Situation, Place ? Normal Continent Weight:   ?Height:     ?BEHAVIORAL SYMPTOMS/MOOD NEUROLOGICAL BOWEL NUTRITION STATUS  ?    Continent Diet (heart healthy/carb modified)  ?AMBULATORY STATUS COMMUNICATION OF NEEDS Skin   ?Limited Assist Verbally Normal ?  ?  ?  ?    ?     ?     ? ? ?  Personal Care Assistance Level of Assistance  ?Bathing, Feeding, Dressing Bathing Assistance: Limited assistance ?Feeding assistance: Limited assistance ?Dressing Assistance: Limited assistance ?   ? ?Functional Limitations Info  ?    ?  ?   ? ? ?SPECIAL CARE FACTORS FREQUENCY  ?PT (By licensed PT), OT (By licensed OT)   ?  ?PT Frequency: 5x/wk ?OT Frequency: 5x/wk ?  ?  ?  ?   ? ? ?Contractures Contractures Info: Not present  ? ? ?Additional Factors Info  ?Code Status, Allergies Code Status Info: Full ?Allergies Info: Lisinopril, Oxycodone, Tape, Benadryl (Diphenhydramine Hcl), Darvocet (Propoxyphene  N-acetaminophen), Darvon (Propoxyphene Hcl), Tylenol (Acetaminophen) ?  ?  ?  ?   ? ?Current Medications (06/15/2021):  This is the current hospital active medication list ?Current Facility-Administered Medications  ?Medication Dose Route Frequency Provider Last Rate Last Admin  ? 0.9 %  sodium chloride infusion   Intravenous Continuous Donnetta Simpers, MD 150 mL/hr at 06/15/21 0714 New Bag at 06/15/21 0623  ? acetaminophen (TYLENOL) tablet 650 mg  650 mg Oral Q4H PRN Mansy, Jan A, MD      ? Or  ? acetaminophen (TYLENOL) 160 MG/5ML solution 650 mg  650 mg Per Tube Q4H PRN Mansy, Jan A, MD      ? Or  ? acetaminophen (TYLENOL) suppository 650 mg  650 mg Rectal Q4H PRN Mansy, Jan A, MD      ? acidophilus (RISAQUAD) capsule 1 capsule  1 capsule Oral Daily Mansy, Jan A, MD   1 capsule at 06/15/21 0945  ? aspirin EC tablet 325 mg  325 mg Oral Daily Rosalin Hawking, MD   325 mg at 06/15/21 0945  ? azelastine (ASTELIN) 0.1 % nasal spray 1 spray  1 spray Each Nare BID Mansy, Jan A, MD      ? clopidogrel (PLAVIX) tablet 75 mg  75 mg Oral Daily Donnetta Simpers, MD   75 mg at 06/15/21 0945  ? enoxaparin (LOVENOX) injection 40 mg  40 mg Subcutaneous Q24H Mansy, Jan A, MD   40 mg at 06/14/21 2319  ? EPINEPHrine (EPI-PEN) injection 0.3 mg  0.3 mg Intramuscular PRN Mansy, Jan A, MD      ? fluticasone (FLONASE) 50 MCG/ACT nasal spray 1 spray  1 spray Each Nare BID Mansy, Jan A, MD      ? glipiZIDE (GLUCOTROL XL) 24 hr tablet 2.5 mg  2.5 mg Oral q morning Mansy, Jan A, MD   2.5 mg at 06/15/21 0945  ? insulin aspart (novoLOG) injection 0-5 Units  0-5 Units Subcutaneous QHS Shalhoub, Sherryll Burger, MD      ? insulin aspart (novoLOG) injection 0-9 Units  0-9 Units Subcutaneous TID AC & HS Shalhoub, Sherryll Burger, MD      ? levothyroxine (SYNTHROID) tablet 100 mcg  100 mcg Oral QAC breakfast Mansy, Jan A, MD   100 mcg at 06/15/21 0601  ? magnesium hydroxide (MILK OF MAGNESIA) suspension 30 mL  30 mL Oral Daily PRN Mansy, Jan A, MD      ?  metoprolol succinate (TOPROL-XL) 24 hr tablet 25 mg  25 mg Oral Daily Mansy, Jan A, MD   25 mg at 06/15/21 0945  ? nitroGLYCERIN (NITROSTAT) SL tablet 0.4 mg  0.4 mg Sublingual Q5 min PRN Mansy, Jan A, MD      ? ondansetron [facility]) injection 4 mg  4 mg Intravenous Q4H PRN Mansy, Jan A, MD      ? pantoprazole (PROTONIX) EC tablet 40 mg  40 mg Oral BID Mansy, Jan A, MD   40 mg at 06/15/21 0945  ? pravastatin (PRAVACHOL) tablet 40 mg  40 mg Oral q1800 Rosalin Hawking, MD      ? senna [facility]) tablet 17.2 mg  2 tablet Oral BID Mansy, Jan A, MD   17.2 mg at 06/15/21 0945  ? senna-docusate (Senokot-S) tablet 1 tablet  1 tablet Oral QHS PRN Mansy, Jan A, MD      ? sodium chloride flush (NS) 0.9 % injection 3 mL  3 mL Intravenous Once Lajean Saver, MD      ? traZODone (DESYREL) tablet 25 mg  25 mg Oral QHS PRN Mansy, Jan A, MD      ? vitamin B-12 (CYANOCOBALAMIN) tablet 5,000 mcg  5,000 mcg Oral Daily Mansy, Jan A, MD   5,000 mcg at 06/15/21 0945  ? ? ? ?Discharge Medications: ?Please see discharge summary for a list of discharge medications. ? ?Relevant Imaging Results: ? ?Relevant Lab Results: ? ? ?Additional Information ?SS#: [id number] ? ?Geralynn Ochs, LCSW ? ? ? ? ?

## 2021-06-15 NOTE — Progress Notes (Addendum)
STROKE TEAM PROGRESS NOTE  ? ?INTERVAL HISTORY ?No family at the bedside. Can't remember if he's had afib or not.  ?GI Bleeding and hematuria in the past.  ?ASA 325 and Plavix '75mg'$  x85month and then plavix alone. Recommend increasing statin or adding zetia to get LDL to goal.  ? ?Vitals:  ? 06/14/21 2015 06/14/21 2024 06/14/21 2250 06/15/21 0321  ?BP: (!) 178/125  (!) 184/82 (!) 144/73  ?Pulse: 64  (!) 58 65  ?Resp: '11  17 19  '$ ?Temp:   98 ?F (36.7 ?C) 98 ?F (36.7 ?C)  ?TempSrc:   Oral Oral  ?SpO2: 100% 100% 100% 96%  ? ?CBC:  ?Recent Labs  ?Lab 06/14/21 ?1601 06/14/21 ?1607  ?WBC 5.2  --   ?NEUTROABS 3.7  --   ?HGB 14.7 14.6  ?HCT 42.9 43.0  ?MCV 92.3  --   ?PLT 169  --   ? ?Basic Metabolic Panel:  ?Recent Labs  ?Lab 06/14/21 ?1601 06/14/21 ?1607  ?NA 136 138  ?K 4.0 4.1  ?CL 102 102  ?CO2 25  --   ?GLUCOSE 131* 126*  ?BUN 14 16  ?CREATININE 1.50* 1.60*  ?CALCIUM 9.1  --   ? ?Lipid Panel:  ?Recent Labs  ?Lab 06/15/21 ?0308  ?CHOL 156  ?TRIG 180*  ?HDL 30*  ?CHOLHDL 5.2  ?VLDL 36  ?LPark90  ? ?HgbA1c: No results for input(s): HGBA1C in the last 168 hours. ?Urine Drug Screen:  ?Recent Labs  ?Lab 06/14/21 ?2002  ?LABOPIA NONE DETECTED  ?COCAINSCRNUR NONE DETECTED  ?LABBENZ NONE DETECTED  ?AMPHETMU NONE DETECTED  ?THCU NONE DETECTED  ?LABBARB NONE DETECTED  ?  ?Alcohol Level No results for input(s): ETH in the last 168 hours. ? ?IMAGING past 24 hours ?CT ANGIO HEAD NECK W WO CM ? ?Result Date: 06/14/2021 ?CLINICAL DATA:  Stroke on same-day MRI in the left cerebellum, with loss of the left vertebral artery flow void. EXAM: CT ANGIOGRAPHY HEAD AND NECK TECHNIQUE: Multidetector CT imaging of the head and neck was performed using the standard protocol during bolus administration of intravenous contrast. Multiplanar CT image reconstructions and MIPs were obtained to evaluate the vascular anatomy. Carotid stenosis measurements (when applicable) are obtained utilizing NASCET criteria, using the distal internal carotid  diameter as the denominator. RADIATION DOSE REDUCTION: This exam was performed according to the departmental dose-optimization program which includes automated exposure control, adjustment of the mA and/or kV according to patient size and/or use of iterative reconstruction technique. CONTRAST:  653mOMNIPAQUE IOHEXOL 350 MG/ML SOLN COMPARISON:  No prior CTA, correlation is made with CT head and MRI head 06/14/2021 FINDINGS: CT HEAD FINDINGS Brain: Areas of hypodensity in the left cerebellum, which correlate with the acute infarcts seen on the same-day MRI. No other acute infarct. No acute hemorrhage, mass, mass effect, or midline shift. No hydrocephalus or extra-axial collection. Vascular: Hyperdensity in the left vertebral artery (series 5, image 5), concerning for thrombus. Atherosclerotic calcifications in the intracranial carotid and vertebral arteries. Skull: Normal. Negative for fracture or focal lesion. Sinuses: Air-fluid level in the left maxillary sinus with mild mucosal thickening. Otherwise normal paranasal sinuses. Trace fluid in the mastoid air cells. Orbits: Status post bilateral lens replacements. Review of the MIP images confirms the above findings CTA NECK FINDINGS Aortic arch: Standard branching. Imaged portion shows no evidence of aneurysm or dissection. No significant stenosis of the major arch vessel origins. Aortic atherosclerosis. Right carotid system: No evidence of dissection, stenosis (50% or greater) or occlusion. Left carotid  system: No evidence of dissection, stenosis (50% or greater) or occlusion. Calcifications in the distal left CCA causes less than 50% luminal narrowing. Vertebral arteries: The right vertebral artery is patent from its origin to the vertebrobasilar junction. The left vertebral artery is patent proximally, with gradual decrease in contrast through the V2 segment (series 11, image 217) and non opacification of the V3 segment (series 11, image 181). Skeleton:  Degenerative changes in the cervical spine. Status post median sternotomy. Other neck: None. Upper chest: Dependent atelectasis. Apical pleural-parenchymal scarring. No definite focal pulmonary opacity or pleural effusion. Review of the MIP images confirms the above findings CTA HEAD FINDINGS Anterior circulation: Both internal carotid arteries are patent to the termini, without significant stenosis. A1 segments patent. Normal anterior communicating artery. Mild narrowing in the left A2 (series 11, image 85). Anterior cerebral arteries otherwise are patent to their distal aspects. No M1 stenosis or occlusion. Normal MCA bifurcations. Distal MCA branches perfused and symmetric. Posterior circulation: The right vertebral artery is patent from the skull base to the vertebrobasilar junction, with mild focal narrowing distally (series 11, images 131-133). The left vertebral artery is non-opacified proximally, with opacification just proximal to the vertebrobasilar junction, favored to be retrograde. A small amount of retrograde enhancement is also noted extending into the minimally opacified left PICA. The right PICA is patent. Basilar somewhat irregular but patent to its distal aspect. Superior cerebellar arteries patent bilaterally. Patent P1 segments. Moderate narrowing in the right P2 (series 11, image 103) and severe focal narrowing in the proximal left P2 (series 11, image 98). PCAs otherwise perfused to their distal aspects without stenosis. The bilateral posterior communicating arteries are not visualized. Venous sinuses: As permitted by contrast timing, patent. Anatomic variants: None significant. Review of the MIP images confirms the above findings IMPRESSION: 1. The left vertebral artery is patent proximally, with gradual diminution in enhancement throughout the V2 segment, non enhancement of the V3 segment, and complete non enhancement of the V4 segment with the exception of the most distal part, which  demonstrates retrograde enhancement. The nonenhancing portion of V4 includes the origin of the left PICA, which demonstrates minimal, likely retrograde, enhancement. Given the acute left PICA territory infarcts, this non opacification of the left vertebral artery may be acute. Consider neuro interventional consult. 2. No other hemodynamically significant stenosis in the neck. 3. Severe focal narrowing in the proximal left P2 and moderate narrowing in the right P2. 4. Mild focal narrowing in the left A2 and distal right V4. 5. Redemonstrated hypodensity in the left cerebellum, which correlates with areas of restricted diffusion on the same-day MRI. These results were called by telephone at the time of interpretation on 06/14/2021 at 10:29 pm to provider [facility] , who verbally acknowledged these results. Electronically Signed   By: Merilyn Baba M.D.   On: 06/14/2021 22:29  ? ?CT HEAD WO CONTRAST ? ?Result Date: 06/14/2021 ?CLINICAL DATA:  Neuro deficit, acute, stroke suspected EXAM: CT HEAD WITHOUT CONTRAST TECHNIQUE: Contiguous axial images were obtained from the base of the skull through the vertex without intravenous contrast. RADIATION DOSE REDUCTION: This exam was performed according to the departmental dose-optimization program which includes automated exposure control, adjustment of the mA and/or kV according to patient size and/or use of iterative reconstruction technique. COMPARISON:  None. FINDINGS: Brain: Mild cerebral atrophy, in keeping with patient's age. No acute intracranial abnormality. Specifically, no hemorrhage, hydrocephalus, mass lesion, acute infarction, or significant intracranial injury. Vascular: No hyperdense vessel or unexpected calcification.  Skull: No acute calvarial abnormality. Sinuses/Orbits: Mucosal thickening and air-fluid level in the left maxillary sinus. Other: None IMPRESSION: No acute intracranial abnormality. Acute on chronic left maxillary sinusitis. Electronically Signed    By: Rolm Baptise M.D.   On: 06/14/2021 17:58  ? ?MR BRAIN WO CONTRAST ? ?Result Date: 06/14/2021 ?CLINICAL DATA:  Stroke suspected, unsteady gait and dizziness EXAM: MRI HEAD WITHOUT CONTRAST TECHNIQUE: Multiplanar, multi

## 2021-06-15 NOTE — Assessment & Plan Note (Signed)
-   We will continue Toprol-XL with allowance of permissive hypertension. ?

## 2021-06-15 NOTE — Evaluation (Signed)
Occupational Therapy Evaluation ?Patient Details ?Name: Daniel Herrera ?MRN: [id number] ?DOB: 31-Mar-1941 ?Today's Date: 06/15/2021 ? ? ?History of Present Illness Daniel Herrera is a 81 y.o. male who presented to the ER with acute onset of dizziness and imbalance with a feeling of leaning to the left. MRI revealed acute L cerebellum infact, L PICA territory. Loss of L vertebral arter flow void. PMH: cardiac disease status post PCI and stent, CHF, stage III CKD, diverticulosis, dyslipidemia, GERD, hypertension, urolithiasis, CABGx3 2/20, ORIF R ankle 09/2020  ? ?Clinical Impression ?  ?Patient admitted for the diagnosis above.  PTA he lives with his spouse, who can provide supportive assistance, but not physical assist.  He needed no assist with mobility, ADL or IADL at home.  Primary deficit is balance and dizziness.  Currently he is needing up to [facility] for mobility and ADL completion from a sit/stand level. The patient currently wants to consider SNF for post acute rehab.  There is a facility within walking distance.  His spouse cannot provide the needed physical assist if needed.  If he continues to progress, HH could be considered.  OT will follow in the acute setting.     ?   ? ?Recommendations for follow up therapy are one component of a multi-disciplinary discharge planning process, led by the attending physician.  Recommendations may be updated based on patient status, additional functional criteria and insurance authorization.  ? ?Follow Up Recommendations ? Skilled nursing-short term rehab (<3 hours/day)  ?  ?Assistance Recommended at Discharge Intermittent Supervision/Assistance  ?Patient can return home with the following A little help with walking and/or transfers;A little help with bathing/dressing/bathroom;Assistance with cooking/housework ? ?  ?Functional Status Assessment ? Patient has had a recent decline in their functional status and demonstrates the ability to make significant improvements  in function in a reasonable and predictable amount of time.  ?Equipment Recommendations ? None recommended by OT  ?  ?Recommendations for Other Services   ? ? ?  ?Precautions / Restrictions Precautions ?Precautions: Fall ?Precaution Comments: dizziness with movement, L lateral lean ?Restrictions ?Weight Bearing Restrictions: No  ? ?  ? ?Mobility Bed Mobility ?Overal bed mobility: Needs Assistance ?Bed Mobility: Supine to Sit, Sit to Supine ?  ?  ?Supine to sit: Supervision ?Sit to supine: Supervision ?  ?  ?  ? ?Transfers ?Overall transfer level: Needs assistance ?Equipment used: Rolling walker (2 wheels) ?Transfers: Sit to/from Stand, Bed to chair/wheelchair/BSC ?Sit to Stand: Min guard ?  ?  ?Step pivot transfers: Min guard ?  ?  ?General transfer comment: not as much L lateral lean ?  ? ?  ?Balance Overall balance assessment: Needs assistance ?Sitting-balance support: Feet supported, No upper extremity supported ?Sitting balance-Leahy Scale: Good ?  ?  ?Standing balance support: Bilateral upper extremity supported, Reliant on assistive device for balance ?Standing balance-Leahy Scale: Poor ?  ?  ?  ?  ?  ?  ?  ?  ?  ?  ?  ?  ?   ? ?ADL either performed or assessed with clinical judgement  ? ?ADL   ?  ?  ?Grooming: Wash/dry hands;Wash/dry face;Min guard;Standing ?  ?  ?  ?  ?  ?Upper Body Dressing : Set up;Sitting ?  ?Lower Body Dressing: Min guard;Sit to/from stand ?  ?Toilet Transfer: Min guard;Rolling walker (2 wheels);Ambulation ?  ?Toileting- Clothing Manipulation and Hygiene: Supervision/safety;Sitting/lateral lean ?  ?  ?  ?  ?   ? ? ? ?Vision  Baseline Vision/History: 1 Wears glasses ?Patient Visual Report: No change from baseline ?   ?   ?Perception Perception ?Perception: Within Functional Limits ?  ?Praxis Praxis ?Praxis: Intact ?  ? ?Pertinent Vitals/Pain Pain Assessment ?Pain Assessment: No/denies pain  ? ? ? ?Hand Dominance Right ?  ?Extremity/Trunk Assessment Upper Extremity Assessment ?Upper  Extremity Assessment: Overall WFL for tasks assessed ?  ?Lower Extremity Assessment ?Lower Extremity Assessment: Defer to PT evaluation ?  ?Cervical / Trunk Assessment ?Cervical / Trunk Assessment: Normal ?  ?Communication Communication ?Communication: HOH ?  ?Cognition Arousal/Alertness: Awake/alert ?Behavior During Therapy: [facility] for tasks assessed/performed ?Overall Cognitive Status: Within Functional Limits for tasks assessed ?  ?  ?  ?  ?  ?  ?  ?  ?  ?  ?  ?  ?  ?  ?  ?  ?  ?  ?  ?General Comments  VSS on RA ? ?  ?Exercises   ?  ?Shoulder Instructions    ? ? ?Home Living Family/patient expects to be discharged to:: Private residence ?Living Arrangements: Spouse/significant other ?Available Help at Discharge: Family;Available 24 hours/day ?Type of Home: House ?Home Access: Stairs to enter ?Entrance Stairs-Number of Steps: 2 ?Entrance Stairs-Rails: Right ?Home Layout: One level ?  ?  ?Bathroom Shower/Tub: Walk-in shower ?  ?Bathroom Toilet: Handicapped height ?Bathroom Accessibility: Yes ?How Accessible: Accessible via walker ?Home Equipment: Conservation officer, nature (2 wheels);Cane - quad;Rollator (4 wheels);Grab bars - toilet;Grab bars - tub/shower;Wheelchair - manual;Shower seat ?  ?  ?  ? ?  ?Prior Functioning/Environment Prior Level of Function : Independent/Modified Independent ?  ?  ?  ?  ?  ?  ?Mobility Comments: no AD, wasn't driving much, daughter gets groceries ?ADLs Comments: No assist with ADL/IADL ?  ? ?  ?  ?OT Problem List: Impaired balance (sitting and/or standing) ?  ?   ?OT Treatment/Interventions: Self-care/ADL training;DME and/or AE instruction;Therapeutic activities;Balance training  ?  ?OT Goals(Current goals can be found in the care plan section) Acute Rehab OT Goals ?Patient Stated Goal: I need to move better ?OT Goal Formulation: With patient ?Time For Goal Achievement: 06/29/21 ?Potential to Achieve Goals: Good ?ADL Goals ?Pt Will Perform Grooming: with modified independence;standing ?Pt Will  Perform Lower Body Bathing: with modified independence;sit to/from stand ?Pt Will Perform Lower Body Dressing: with modified independence;sit to/from stand ?Pt Will Transfer to Toilet: with modified independence;ambulating;regular height toilet  ?OT Frequency: Min 2X/week ?  ? ?Co-evaluation   ?  ?  ?  ?  ? ?  ?AM-PAC OT "6 Clicks" Daily Activity     ?Outcome Measure Help from another person eating meals?: None ?Help from another person taking care of personal grooming?: None ?Help from another person toileting, which includes using toliet, bedpan, or urinal?: A Little ?Help from another person bathing (including washing, rinsing, drying)?: A Little ?Help from another person to put on and taking off regular upper body clothing?: None ?Help from another person to put on and taking off regular lower body clothing?: A Little ?6 Click Score: 21 ?  ?End of Session Equipment Utilized During Treatment: Rolling walker (2 wheels) ?Nurse Communication: Mobility status ? ?Activity Tolerance: Patient tolerated treatment well ?Patient left: in bed;with call bell/phone within reach ? ?OT Visit Diagnosis: Unsteadiness on feet (R26.81)  ?              ?Time: 3734-2876 ?OT Time Calculation (min): 19 min ?Charges:  OT General Charges ?$OT Visit: 1 Visit ?OT Evaluation ?$OT  Eval Moderate Complexity: 1 Mod ? ?06/15/2021 ? ?RP, OTR/L ? ?Acute Rehabilitation Services ? ?Office:  [phone] ? ? ?Kimberleigh Mehan D Masen Luallen ?06/15/2021, 11:45 AM ?

## 2021-06-15 NOTE — Progress Notes (Signed)
Received patient from ED accompanied by transport awake in bed. Oriented to surroundings, patient handbook given, explained rights and responsibilities and visitors guidelines. ?

## 2021-06-15 NOTE — Progress Notes (Signed)
PROGRESS NOTE    Daniel Herrera  IHK:[id number] DOB: 07/19/1940 DOA: 06/14/2021 PCP: Hurshel Party, NP    Brief Narrative:  81 year old gentleman from home with history of coronary artery disease, stage IIIa CKD, dyslipidemia, GERD, hypertension presented to the ER with acute onset of dizziness and imbalance, leaning to the left side.  In the emergency room hemodynamically stable.  CT head with no acute intracranial abnormality.  MRI brain with left cerebellar stroke.  Admitted with acute stroke.   Assessment & Plan:   Acute ischemic stroke, left cerebellar stroke: Clinical findings, loss of balance and left leaning. CT head findings, no initial intracranial abnormality. MRI of the brain, left PICA territory stroke. CTA of the head and neck, no hemodynamically significant stenosis in the neck, severe focal narrowing of the proximal left P2 and moderate narrowing of the right P2, focal narrowing in the left A2 and distal right V4. Suspected left V4 acute occlusion. 2D echocardiogram, pending today. Antiplatelet therapy, on aspirin at home.  Neurology recommended aspirin and Plavix. LDL 90.  Goal less than 70.  Patient not on treatment at home.  He started on pravastatin 40 mg daily. Hemoglobin A1c, pending.  On glipizide at home. DVT prophylaxis, Lovenox. Therapy recommendations, skilled nursing facility. Neurology/PT/OT/speech following.  Hypothyroidism: Euthyroid on Synthroid.  Coronary artery disease: Stable.  On statin, aspirin, nitroglycerin and beta-blockers.  Type 2 diabetes, well controlled with no complications: A1c pending.  Patient takes glipizide at home.  Currently continued also sliding scale insulin.  GERD: On PPI continued from home.  Essential hypertension: Continue Toprol-XL.  Adequately controlled.  No indication for permissive hypertension.  Work with PT OT.  Final neurology recommendation pending.  Due to persistent neurological deficits, may likely benefit  with a skilled nursing facility placement.    DVT prophylaxis: enoxaparin (LOVENOX) injection 40 mg Start: 06/14/21 2100   Code Status: Full code Family Communication: None Disposition Plan: Status is: Observation The patient will require care spanning > 2 midnights and should be moved to inpatient because: Acute ischemic stroke, will need further investigations and rehab.     Consultants:  Neurology  Procedures:  None  Antimicrobials:  None   Subjective: Patient seen and examined.  He was trying to work with physical therapist.  He felt dizzy on getting up today.  No orthostatic changes, however did not feel well enough to walk.  No other overnight events.  Objective: Vitals:   06/14/21 2024 06/14/21 2250 06/15/21 0321 06/15/21 0747  BP:  (!) 184/82 (!) 144/73 (!) 145/80  Pulse:  (!) 58 65 71  Resp:  17 19 16   Temp:  98 F (36.7 C) 98 F (36.7 C) 98.4 F (36.9 C)  TempSrc:  Oral Oral Oral  SpO2: 100% 100% 96% 97%    Intake/Output Summary (Last 24 hours) at 06/15/2021 1036 Last data filed at 06/15/2021 0730 Gross per 24 hour  Intake --  Output 825 ml  Net -825 ml   There were no vitals filed for this visit.  Examination:  General exam: Appears calm and comfortable when sitting. Is slightly anxious.  On room air.  Able to stand up with support. Respiratory system: Clear to auscultation. Respiratory effort normal. Cardiovascular system: S1 & S2 heard, RRR.  No edema. Gastrointestinal system: Soft.  Nontender.  Bowel sound present.. Central nervous system: Alert and oriented.  No motor or sensory deficits. He has left leaning gait. Extremities: Symmetric 5 x 5 power. Skin: No rashes, lesions or ulcers  Data Reviewed: I have personally reviewed following labs and imaging studies  CBC: Recent Labs  Lab 06/14/21 1601 06/14/21 1607  WBC 5.2  --   NEUTROABS 3.7  --   HGB 14.7 14.6  HCT 42.9 43.0  MCV 92.3  --   PLT 169  --    Basic Metabolic  Panel: Recent Labs  Lab 06/14/21 1601 06/14/21 1607  NA 136 138  K 4.0 4.1  CL 102 102  CO2 25  --   GLUCOSE 131* 126*  BUN 14 16  CREATININE 1.50* 1.60*  CALCIUM 9.1  --    GFR: CrCl cannot be calculated (Unknown ideal weight.). Liver Function Tests: Recent Labs  Lab 06/14/21 1601  AST 23  ALT 22  ALKPHOS 70  BILITOT 0.5  PROT 6.4*  ALBUMIN 3.9   No results for input(s): LIPASE, AMYLASE in the last 168 hours. No results for input(s): AMMONIA in the last 168 hours. Coagulation Profile: Recent Labs  Lab 06/14/21 1601  INR 1.1   Cardiac Enzymes: No results for input(s): CKTOTAL, CKMB, CKMBINDEX, TROPONINI in the last 168 hours. BNP (last 3 results) No results for input(s): PROBNP in the last 8760 hours. HbA1C: No results for input(s): HGBA1C in the last 72 hours. CBG: Recent Labs  Lab 06/14/21 2307 06/15/21 0622  GLUCAP 95 116*   Lipid Profile: Recent Labs    06/15/21 0308  CHOL 156  HDL 30*  LDLCALC 90  TRIG 161*  CHOLHDL 5.2   Thyroid Function Tests: No results for input(s): TSH, T4TOTAL, FREET4, T3FREE, THYROIDAB in the last 72 hours. Anemia Panel: No results for input(s): VITAMINB12, FOLATE, FERRITIN, TIBC, IRON, RETICCTPCT in the last 72 hours. Sepsis Labs: No results for input(s): PROCALCITON, LATICACIDVEN in the last 168 hours.  Recent Results (from the past 240 hour(s))  Resp Panel by RT-PCR (Flu A&B, Covid) Nasopharyngeal Swab     Status: None   Collection Time: 06/14/21  3:46 PM   Specimen: Nasopharyngeal Swab; Nasopharyngeal(NP) swabs in vial transport medium  Result Value Ref Range Status   SARS Coronavirus 2 by RT PCR NEGATIVE NEGATIVE Final    Comment: (NOTE) SARS-CoV-2 target nucleic acids are NOT DETECTED.  The SARS-CoV-2 RNA is generally detectable in upper respiratory specimens during the acute phase of infection. The lowest concentration of SARS-CoV-2 viral copies this assay can detect is 138 copies/mL. A negative result  does not preclude SARS-Cov-2 infection and should not be used as the sole basis for treatment or other patient management decisions. A negative result may occur with  improper specimen collection/handling, submission of specimen other than nasopharyngeal swab, presence of viral mutation(s) within the areas targeted by this assay, and inadequate number of viral copies(<138 copies/mL). A negative result must be combined with clinical observations, patient history, and epidemiological information. The expected result is Negative.  Fact Sheet for Patients:  BloggerCourse.com  Fact Sheet for Healthcare Providers:  SeriousBroker.it  This test is no t yet approved or cleared by the [place] FDA and  has been authorized for detection and/or diagnosis of SARS-CoV-2 by FDA under an Emergency Use Authorization (EUA). This EUA will remain  in effect (meaning this test can be used) for the duration of the COVID-19 declaration under Section 564(b)(1) of the Act, 21 U.S.C.section 360bbb-3(b)(1), unless the authorization is terminated  or revoked sooner.       Influenza A by PCR NEGATIVE NEGATIVE Final   Influenza B by PCR NEGATIVE NEGATIVE Final    Comment: (NOTE)  The Xpert Xpress SARS-CoV-2/FLU/RSV plus assay is intended as an aid in the diagnosis of influenza from Nasopharyngeal swab specimens and should not be used as a sole basis for treatment. Nasal washings and aspirates are unacceptable for Xpert Xpress SARS-CoV-2/FLU/RSV testing.  Fact Sheet for Patients: BloggerCourse.com  Fact Sheet for Healthcare Providers: SeriousBroker.it  This test is not yet approved or cleared by the [place] FDA and has been authorized for detection and/or diagnosis of SARS-CoV-2 by FDA under an Emergency Use Authorization (EUA). This EUA will remain in effect (meaning this test can be used) for  the duration of the COVID-19 declaration under Section 564(b)(1) of the Act, 21 U.S.C. section 360bbb-3(b)(1), unless the authorization is terminated or revoked.  Performed at [facility], [address], [place], [place] [address]          Radiology Studies: CT ANGIO HEAD NECK W WO CM  Result Date: 06/14/2021 CLINICAL DATA:  Stroke on same-day MRI in the left cerebellum, with loss of the left vertebral artery flow void. EXAM: CT ANGIOGRAPHY HEAD AND NECK TECHNIQUE: Multidetector CT imaging of the head and neck was performed using the standard protocol during bolus administration of intravenous contrast. Multiplanar CT image reconstructions and MIPs were obtained to evaluate the vascular anatomy. Carotid stenosis measurements (when applicable) are obtained utilizing NASCET criteria, using the distal internal carotid diameter as the denominator. RADIATION DOSE REDUCTION: This exam was performed according to the departmental dose-optimization program which includes automated exposure control, adjustment of the mA and/or kV according to patient size and/or use of iterative reconstruction technique. CONTRAST:  60mL OMNIPAQUE IOHEXOL 350 MG/ML SOLN COMPARISON:  No prior CTA, correlation is made with CT head and MRI head 06/14/2021 FINDINGS: CT HEAD FINDINGS Brain: Areas of hypodensity in the left cerebellum, which correlate with the acute infarcts seen on the same-day MRI. No other acute infarct. No acute hemorrhage, mass, mass effect, or midline shift. No hydrocephalus or extra-axial collection. Vascular: Hyperdensity in the left vertebral artery (series 5, image 5), concerning for thrombus. Atherosclerotic calcifications in the intracranial carotid and vertebral arteries. Skull: Normal. Negative for fracture or focal lesion. Sinuses: Air-fluid level in the left maxillary sinus with mild mucosal thickening. Otherwise normal paranasal sinuses. Trace fluid in the mastoid air cells. Orbits: Status  post bilateral lens replacements. Review of the MIP images confirms the above findings CTA NECK FINDINGS Aortic arch: Standard branching. Imaged portion shows no evidence of aneurysm or dissection. No significant stenosis of the major arch vessel origins. Aortic atherosclerosis. Right carotid system: No evidence of dissection, stenosis (50% or greater) or occlusion. Left carotid system: No evidence of dissection, stenosis (50% or greater) or occlusion. Calcifications in the distal left CCA causes less than 50% luminal narrowing. Vertebral arteries: The right vertebral artery is patent from its origin to the vertebrobasilar junction. The left vertebral artery is patent proximally, with gradual decrease in contrast through the V2 segment (series 11, image 217) and non opacification of the V3 segment (series 11, image 181). Skeleton: Degenerative changes in the cervical spine. Status post median sternotomy. Other neck: None. Upper chest: Dependent atelectasis. Apical pleural-parenchymal scarring. No definite focal pulmonary opacity or pleural effusion. Review of the MIP images confirms the above findings CTA HEAD FINDINGS Anterior circulation: Both internal carotid arteries are patent to the termini, without significant stenosis. A1 segments patent. Normal anterior communicating artery. Mild narrowing in the left A2 (series 11, image 85). Anterior cerebral arteries otherwise are patent to their distal aspects.  No M1 stenosis or occlusion. Normal MCA bifurcations. Distal MCA branches perfused and symmetric. Posterior circulation: The right vertebral artery is patent from the skull base to the vertebrobasilar junction, with mild focal narrowing distally (series 11, images 131-133). The left vertebral artery is non-opacified proximally, with opacification just proximal to the vertebrobasilar junction, favored to be retrograde. A small amount of retrograde enhancement is also noted extending into the minimally opacified  left PICA. The right PICA is patent. Basilar somewhat irregular but patent to its distal aspect. Superior cerebellar arteries patent bilaterally. Patent P1 segments. Moderate narrowing in the right P2 (series 11, image 103) and severe focal narrowing in the proximal left P2 (series 11, image 98). PCAs otherwise perfused to their distal aspects without stenosis. The bilateral posterior communicating arteries are not visualized. Venous sinuses: As permitted by contrast timing, patent. Anatomic variants: None significant. Review of the MIP images confirms the above findings IMPRESSION: 1. The left vertebral artery is patent proximally, with gradual diminution in enhancement throughout the V2 segment, non enhancement of the V3 segment, and complete non enhancement of the V4 segment with the exception of the most distal part, which demonstrates retrograde enhancement. The nonenhancing portion of V4 includes the origin of the left PICA, which demonstrates minimal, likely retrograde, enhancement. Given the acute left PICA territory infarcts, this non opacification of the left vertebral artery may be acute. Consider neuro interventional consult. 2. No other hemodynamically significant stenosis in the neck. 3. Severe focal narrowing in the proximal left P2 and moderate narrowing in the right P2. 4. Mild focal narrowing in the left A2 and distal right V4. 5. Redemonstrated hypodensity in the left cerebellum, which correlates with areas of restricted diffusion on the same-day MRI. These results were called by telephone at the time of interpretation on 06/14/2021 at 10:29 pm to provider [facility] , who verbally acknowledged these results. Electronically Signed   By: Wiliam Ke M.D.   On: 06/14/2021 22:29   CT HEAD WO CONTRAST  Result Date: 06/14/2021 CLINICAL DATA:  Neuro deficit, acute, stroke suspected EXAM: CT HEAD WITHOUT CONTRAST TECHNIQUE: Contiguous axial images were obtained from the base of the skull through  the vertex without intravenous contrast. RADIATION DOSE REDUCTION: This exam was performed according to the departmental dose-optimization program which includes automated exposure control, adjustment of the mA and/or kV according to patient size and/or use of iterative reconstruction technique. COMPARISON:  None. FINDINGS: Brain: Mild cerebral atrophy, in keeping with patient's age. No acute intracranial abnormality. Specifically, no hemorrhage, hydrocephalus, mass lesion, acute infarction, or significant intracranial injury. Vascular: No hyperdense vessel or unexpected calcification. Skull: No acute calvarial abnormality. Sinuses/Orbits: Mucosal thickening and air-fluid level in the left maxillary sinus. Other: None IMPRESSION: No acute intracranial abnormality. Acute on chronic left maxillary sinusitis. Electronically Signed   By: Charlett Nose M.D.   On: 06/14/2021 17:58   MR BRAIN WO CONTRAST  Result Date: 06/14/2021 CLINICAL DATA:  Stroke suspected, unsteady gait and dizziness EXAM: MRI HEAD WITHOUT CONTRAST TECHNIQUE: Multiplanar, multiecho pulse sequences of the brain and surrounding structures were obtained without intravenous contrast. COMPARISON:  No prior MRI, correlation is made with CT head 06/14/2021. FINDINGS: Brain: Restricted diffusion in the left cerebellar hemisphere with ADC correlates (series 5, image 59-65 and series 6, images 7-13), predominantly in the left PICA territory. There is some mild associated T2 hyperintense signal but no evidence of hemorrhage, mass effect, or midline shift. Focus of hemosiderin deposition in the left posterior temporal lobe, possibly  sequela of prior hypertensive microhemorrhage. No hydrocephalus or extra-axial collection. Scattered T2 hyperintense signal in the periventricular white matter, likely the sequela of mild chronic small vessel ischemic disease. Vascular: Loss of the left vertebral artery flow void, which may indicate slow flow or occlusion. In  retrospect, there is a possible hyperdense left vertebral artery on the same-day CT. Otherwise normal arterial flow voids. Skull and upper cervical spine: Normal marrow signal. Sinuses/Orbits: Mucosal thickening in the left maxillary sinus with air-fluid level. Status post bilateral lens replacements. Other: Trace fluid in the mastoid air cells. IMPRESSION: Acute and/or subacute infarcts in the left cerebellum, predominantly in the left PICA territory. In addition there is loss of the left vertebral artery flow void, which may indicate slow flow or occlusion and a possible hyperdense left vertebral artery on the same-day CT. A CTA head neck is recommended for further evaluation. These results were called by telephone at the time of interpretation on 06/14/2021 at 7:41 pm to provider [facility] , who verbally acknowledged these results. Electronically Signed   By: Wiliam Ke M.D.   On: 06/14/2021 19:41        Scheduled Meds:  acidophilus  1 capsule Oral Daily   aspirin EC  325 mg Oral Daily   azelastine  1 spray Each Nare BID   clopidogrel  75 mg Oral Daily   enoxaparin (LOVENOX) injection  40 mg Subcutaneous Q24H   fluticasone  1 spray Each Nare BID   glipiZIDE  2.5 mg Oral q morning   insulin aspart  0-5 Units Subcutaneous QHS   insulin aspart  0-9 Units Subcutaneous TID AC & HS   levothyroxine  100 mcg Oral QAC breakfast   metoprolol succinate  25 mg Oral Daily   pantoprazole  40 mg Oral BID   pravastatin  40 mg Oral q1800   senna  2 tablet Oral BID   sodium chloride flush  3 mL Intravenous Once   vitamin B-12  5,000 mcg Oral Daily   Continuous Infusions:  sodium chloride 150 mL/hr at 06/15/21 0714     LOS: 0 days    Time spent: 35 minutes    Dorcas Carrow, MD [facility] Pager [phone]

## 2021-06-15 NOTE — Assessment & Plan Note (Signed)
-   We will continue PPI therapy 

## 2021-06-15 NOTE — Assessment & Plan Note (Addendum)
-   We will continue his beta-blocker therapy, sublingual nitroglycerin, aspirin and statin therapy. ?

## 2021-06-15 NOTE — Evaluation (Signed)
Physical Therapy Evaluation ?Patient Details ?Name: Daniel Herrera ?MRN: [id number] ?DOB: [birth year] ?Today's Date: 06/15/2021 ? ?History of Present Illness ? Daniel Herrera is a 81 y.o. male who presented to the ER with acute onset of dizziness and imbalance with a feeling of leaning to the left. MRI revealed acute L cerebellum infact, L PICA territory. Loss of L vertebral arter flow void. PMH: cardiac disease status post PCI and stent, CHF, stage III CKD, diverticulosis, dyslipidemia, GERD, hypertension, urolithiasis, CABGx3 2/20, ORIF R ankle 09/2020 ?  ?Clinical Impression ? Pt admitted with above. Pt with noted dizziness with movement due to cerebellar infarct. Discussed this with the patient, pt with good understanding. Pt educated on gaze stabilization to help minimize dizziness. Pt was indep PTA, pt now requiring use of RW for mobilization and minA due to impaired balance, dizziness, listing and veering to the L and increased fall risk. Pt aware of deficits and agree is unsafe to mobilize on own. Recommended AIR however pt prefers to go to [facility] in [place] due to being closer to family and familiar with facility as he has been there before. Acute PT to cont to follow. ?   ? ?Recommendations for follow up therapy are one component of a multi-disciplinary discharge planning process, led by the attending physician.  Recommendations may be updated based on patient status, additional functional criteria and insurance authorization. ? ?Follow Up Recommendations Skilled nursing-short term rehab (<3 hours/day) (pt requesting [facility] in [place]) ? ?  ?Assistance Recommended at Discharge Frequent or constant Supervision/Assistance  ?Patient can return home with the following ? A little help with walking and/or transfers;A little help with bathing/dressing/bathroom;Assist for transportation;Help with stairs or ramp for entrance ? ?  ?Equipment Recommendations None recommended by PT (Has DME)   ?Recommendations for Other Services ?    ?  ?Functional Status Assessment Patient has had a recent decline in their functional status and demonstrates the ability to make significant improvements in function in a reasonable and predictable amount of time.  ? ?  ?Precautions / Restrictions Precautions ?Precautions: Fall ?Precaution Comments: dizziness with movement, L lateral lean ?Restrictions ?Weight Bearing Restrictions: No  ? ?  ? ?Mobility ? Bed Mobility ?Overal bed mobility: Needs Assistance ?Bed Mobility: Supine to Sit ?  ?  ?Supine to sit: Supervision, HOB elevated ?  ?  ?General bed mobility comments: report of dizziness upon sitting up, no physical assist needed, diminished with sitting ?  ? ?Transfers ?Overall transfer level: Needs assistance ?Equipment used: Rolling walker (2 wheels) ?Transfers: Sit to/from Stand ?Sit to Stand: Min assist ?  ?  ?  ?  ?  ?General transfer comment: minA due to L lateral lean and dizziness, verbal cues for hand placement to push up from bed and then transition hands to RW ?  ? ?Ambulation/Gait ?Ambulation/Gait assistance: Min assist ?Gait Distance (Feet): 70 Feet ?Assistive device: Rolling walker (2 wheels) ?Gait Pattern/deviations: Step-through pattern, Decreased stride length, Drifts right/left ?Gait velocity: dec, guarded, cautious ?Gait velocity interpretation: <1.31 ft/sec, indicative of household ambulator ?  ?General Gait Details: instructed on gaze stabilization to help minimize dizziness. Pt reports feeling much more steady with RW but is also aware of veering and listing to the L, minA for stability. When pt asked to trial ambulation without AD pt said "I don't want to let go. This walker is helping alot." ? ?Stairs ?  ?  ?  ?  ?  ? ?Wheelchair Mobility ?  ? ?Modified Rankin (Stroke  Patients Only) ?Modified Rankin (Stroke Patients Only) ?Pre-Morbid Rankin Score: Slight disability ?Modified Rankin: Moderately severe disability ? ?  ? ?Balance Overall balance  assessment: Needs assistance ?Sitting-balance support: Feet supported, No upper extremity supported ?Sitting balance-Leahy Scale: Fair ?Sitting balance - Comments: pt with L lateral lean/list, verbal cues to correct self to midline ?Postural control: Left lateral lean ?Standing balance support: Bilateral upper extremity supported ?Standing balance-Leahy Scale: Poor ?Standing balance comment: pt very unsteady without support of the walker ?  ?  ?  ?  ?  ?  ?  ?  ?  ?  ?  ?   ? ? ? ?Pertinent Vitals/Pain Pain Assessment ?Pain Assessment: No/denies pain  ? ? ?Home Living Family/patient expects to be discharged to:: Private residence ?Living Arrangements: Spouse/significant other ?Available Help at Discharge: Family;Available PRN/intermittently;Available 24 hours/day ?Type of Home: House ?Home Access: Stairs to enter ?Entrance Stairs-Rails: Right ?Entrance Stairs-Number of Steps: 2 ?  ?Home Layout: One level ?Home Equipment: Conservation officer, nature (2 wheels);Cane - quad;Rollator (4 wheels);Grab bars - toilet;Grab bars - tub/shower;Wheelchair - manual;Shower seat ?   ?  ?Prior Function Prior Level of Function : Independent/Modified Independent ?  ?  ?  ?  ?  ?  ?Mobility Comments: no AD, wasn't driving much, daughter gets groceries ?ADLs Comments: indep ?  ? ? ?Hand Dominance  ? Dominant Hand: Right ? ?  ?Extremity/Trunk Assessment  ? Upper Extremity Assessment ?Upper Extremity Assessment: Overall WFL for tasks assessed ?  ? ?Lower Extremity Assessment ?Lower Extremity Assessment: Overall WFL for tasks assessed ?  ? ?Cervical / Trunk Assessment ?Cervical / Trunk Assessment: Normal  ?Communication  ? Communication: HOH (wears hearing aides)  ?Cognition Arousal/Alertness: Awake/alert ?Behavior During Therapy: [facility] for tasks assessed/performed ?Overall Cognitive Status: Within Functional Limits for tasks assessed ?  ?  ?  ?  ?  ?  ?  ?  ?  ?  ?  ?  ?  ?  ?  ?  ?General Comments: pt aware of deficits and instability, is cautious ?  ?   ? ?  ?General Comments General comments (skin integrity, edema, etc.): VSS, discussed the role of the cerebellum and why he is feeling dizzy as his equilibrium has been affected by the stroke. pt with good understanding ? ?  ?Exercises    ? ?Assessment/Plan  ?  ?PT Assessment Patient needs continued PT services  ?PT Problem List Decreased strength;Decreased activity tolerance;Decreased balance;Decreased mobility;Decreased coordination;Decreased knowledge of use of DME ? ?   ?  ?PT Treatment Interventions DME instruction;Gait training;Stair training;Functional mobility training;Therapeutic activities;Therapeutic exercise;Balance training   ? ?PT Goals (Current goals can be found in the Care Plan section)  ?Acute Rehab PT Goals ?Patient Stated Goal: to get to his wife's 81st birthday party on saturday ?PT Goal Formulation: With patient ?Time For Goal Achievement: 06/29/21 ?Potential to Achieve Goals: Good ? ?  ?Frequency Min 4X/week ?  ? ? ?Co-evaluation   ?  ?  ?  ?  ? ? ?  ?AM-PAC PT "6 Clicks" Mobility  ?Outcome Measure Help needed turning from your back to your side while in a flat bed without using bedrails?: A Little ?Help needed moving from lying on your back to sitting on the side of a flat bed without using bedrails?: A Little ?Help needed moving to and from a bed to a chair (including a wheelchair)?: A Little ?Help needed standing up from a chair using your arms (e.g., wheelchair or bedside chair)?: A  Lot ?Help needed to walk in hospital room?: A Lot ?Help needed climbing 3-5 steps with a railing? : A Lot ?6 Click Score: 15 ? ?  ?End of Session Equipment Utilized During Treatment: Gait belt ?Activity Tolerance: Patient tolerated treatment well ?Patient left: in chair;with call bell/phone within reach;with chair alarm set ?Nurse Communication: Mobility status ?PT Visit Diagnosis: Unsteadiness on feet (R26.81);Muscle weakness (generalized) (M62.81);Difficulty in walking, not elsewhere classified (R26.2) ?   ? ?Time: 7573-2256 ?PT Time Calculation (min) (ACUTE ONLY): 29 min ? ? ?Charges:   PT Evaluation ?$PT Eval Moderate Complexity: 1 Mod ?PT Treatments ?$Gait Training: 8-22 mins ?  ?   ? ? ?Kittie Plater, PT, DPT ?Acute Reh

## 2021-06-15 NOTE — Assessment & Plan Note (Signed)
-   We will continue glipizide XL and place the patient on supplement coverage with NovoLog. ?

## 2021-06-16 DIAGNOSIS — I639 Cerebral infarction, unspecified: Secondary | ICD-10-CM | POA: Diagnosis not present

## 2021-06-16 LAB — BASIC METABOLIC PANEL
Anion gap: 8 (ref 5–15)
BUN: 18 mg/dL (ref 8–23)
CO2: 22 mmol/L (ref 22–32)
Calcium: 8.6 mg/dL — ABNORMAL LOW (ref 8.9–10.3)
Chloride: 107 mmol/L (ref 98–111)
Creatinine, Ser: 1.51 mg/dL — ABNORMAL HIGH (ref 0.61–1.24)
GFR, Estimated: 46 mL/min — ABNORMAL LOW (ref >60)
Glucose, Bld: 101 mg/dL — ABNORMAL HIGH (ref 70–99)
Potassium: 3.7 mmol/L (ref 3.5–5.1)
Sodium: 137 mmol/L (ref 135–145)

## 2021-06-16 LAB — GLUCOSE, CAPILLARY
Glucose-Capillary: 107 mg/dL — ABNORMAL HIGH (ref 70–99)
Glucose-Capillary: 118 mg/dL — ABNORMAL HIGH (ref 70–99)

## 2021-06-16 LAB — HEMOGLOBIN A1C
Hgb A1c MFr Bld: 5.8 % — ABNORMAL HIGH (ref 4.8–5.6)
Mean Plasma Glucose: 120 mg/dL

## 2021-06-16 LAB — PHOSPHORUS: Phosphorus: 3.3 mg/dL (ref 2.5–4.6)

## 2021-06-16 LAB — MAGNESIUM: Magnesium: 1.8 mg/dL (ref 1.7–2.4)

## 2021-06-16 MED ORDER — ASPIRIN 325 MG PO TBEC: 325.0000 mg | DELAYED_RELEASE_TABLET | Freq: Every day | ORAL | 2 refills | Status: DC

## 2021-06-16 MED ORDER — EZETIMIBE 10 MG PO TABS: 10.0000 mg | ORAL_TABLET | Freq: Every day | ORAL | 2 refills | Status: DC

## 2021-06-16 MED ORDER — CLOPIDOGREL BISULFATE 75 MG PO TABS: 75.0000 mg | ORAL_TABLET | Freq: Every day | ORAL | 11 refills | Status: DC

## 2021-06-16 MED ORDER — CLOPIDOGREL BISULFATE 75 MG PO TABS
75.0000 mg | ORAL_TABLET | Freq: Every day | ORAL | Status: DC
  Administered 2021-06-16: 75 mg via ORAL
  Filled 2021-06-16: qty 1

## 2021-06-16 NOTE — TOC Transition Note (Signed)
Transition of Care (TOC) - CM/SW Discharge Note ? ? ?Patient Details  ?Name: JMARION CHRISTIANO ?MRN: [id number] ?Date of Birth: [birth year] ? ?Transition of Care (TOC) CM/SW Contact:  ?Pollie Friar, RN ?Phone Number: ?06/16/2021, 12:33 PM ? ? ?Clinical Narrative:    ?Patient is discharging home with home health services through [facility]. Information on the AVS.  ?Pt states there is any needed DME at their home. He denies any issues with transportation or home medications.  ?Daughter to provide transport home today. ? ? ?Final next level of care: [facility] ?Barriers to Discharge: No Barriers Identified ? ? ?Patient Goals and CMS Choice ?  ?CMS Medicare.gov Compare Post Acute Care list provided to:: Patient ?Choice offered to / list presented to : Patient ? ?Discharge Placement ?  ?           ?  ?  ?  ?  ? ?Discharge Plan and Services ?  ?  ?           ?  ?  ?  ?  ?  ?HH Arranged: PT, OT ?[place] Agency: [place] ?Date HH Agency Contacted: 06/16/21 ?  ?Representative spoke with at [facility]: Tommi Rumps ? ?Social Determinants of Health (SDOH) Interventions ?  ? ? ?Readmission Risk Interventions ?No flowsheet data found. ? ? ? ? ?

## 2021-06-16 NOTE — Progress Notes (Signed)
Pt wheeled off unit by NT  

## 2021-06-16 NOTE — Progress Notes (Signed)
Physical Therapy Treatment ?Patient Details ?Name: Daniel Herrera ?MRN: [id number] ?DOB: March 14, 1941 ?Today's Date: 06/16/2021 ? ? ?History of Present Illness BASEL DEFALCO is a 81 y.o. male who presented to the ER with acute onset of dizziness and imbalance with a feeling of leaning to the left. MRI revealed acute L cerebellum infact, L PICA territory. Loss of L vertebral arter flow void. PMH: cardiac disease status post PCI and stent, CHF, stage III CKD, diverticulosis, dyslipidemia, GERD, hypertension, urolithiasis, CABGx3 2/20, ORIF R ankle 09/2020 ? ?  ?PT Comments  ? ? Much improved even over the evaluation.  Still not at baseline function, but safe to d/c home with his wife and with family assist.  Emphasis on gait with gaze stabilization, but soon moved on to habituation with head turns/scanning, backing up and abrupt directional changes without AD, but with min guard. ?   ?Recommendations for follow up therapy are one component of a multi-disciplinary discharge planning process, led by the attending physician.  Recommendations may be updated based on patient status, additional functional criteria and insurance authorization. ? ?Follow Up Recommendations ? Home health PT ?  ?  ?Assistance Recommended at Discharge PRN  ?Patient can return home with the following Assist for transportation ?  ?Equipment Recommendations ? None recommended by PT  ?  ?Recommendations for Other Services   ? ? ?  ?Precautions / Restrictions Precautions ?Precautions: Fall ?Precaution Comments: dizziness with movement, L lateral lean  ?  ? ?Mobility ? Bed Mobility ?Overal bed mobility: Needs Assistance, Modified Independent ?  ?  ?  ?  ?  ?  ?  ?  ? ?Transfers ?Overall transfer level: Needs assistance ?  ?Transfers: Sit to/from Stand ?Sit to Stand: Supervision ?Stand pivot transfers: Supervision ?  ?  ?  ?  ?  ?  ? ?Ambulation/Gait ?Ambulation/Gait assistance: Supervision ?Gait Distance (Feet): 350 Feet ?Assistive device: Rolling  walker (2 wheels), None ?Gait Pattern/deviations: Step-through pattern, Decreased stride length ?  ?Gait velocity interpretation: 1.31 - 2.62 ft/sec, indicative of limited community ambulator ?  ?General Gait Details: Initially with RW using gaze stabilization, then worked toward habituation with no device.  Pt with notably more drift left, occasional mild stagger with scanning, backing up, but no overt LOB. ? ? ?Stairs ?  ?  ?  ?  ?  ? ? ?Wheelchair Mobility ?  ? ?Modified Rankin (Stroke Patients Only) ?Modified Rankin (Stroke Patients Only) ?Pre-Morbid Rankin Score: Slight disability ?Modified Rankin: Moderate disability ? ? ?  ?Balance   ?  ?Sitting balance-Leahy Scale: Good ?  ?  ?Standing balance support: No upper extremity supported, During functional activity ?Standing balance-Leahy Scale: Fair (toward good) ?Standing balance comment: much improved without RW ?  ?  ?  ?  ?  ?  ?  ?  ?  ?  ?  ?  ? ?  ?Cognition Arousal/Alertness: Awake/alert ?Behavior During Therapy: [facility] for tasks assessed/performed ?Overall Cognitive Status: Within Functional Limits for tasks assessed ?  ?  ?  ?  ?  ?  ?  ?  ?  ?  ?  ?  ?  ?  ?  ?  ?General Comments: pt aware of deficits and instability, is cautious ?  ?  ? ?  ?Exercises   ? ?  ?General Comments   ?  ?  ? ?Pertinent Vitals/Pain Pain Assessment ?Pain Assessment: No/denies pain  ? ? ?Home Living   ?  ?  ?Type of Home:  House ?  ?  ?  ?  ?  ?  ?   ?  ?Prior Function    ?  ?  ?   ? ?PT Goals (current goals can now be found in the care plan section) Acute Rehab PT Goals ?Patient Stated Goal: to get to his wife's 81st birthday party on saturday ?PT Goal Formulation: With patient ?Time For Goal Achievement: 06/29/21 ?Potential to Achieve Goals: Good ?Progress towards PT goals: Progressing toward goals ? ?  ?Frequency ? ? ? Min 4X/week ? ? ? ?  ?PT Plan Discharge plan needs to be updated  ? ? ?Co-evaluation   ?  ?  ?  ?  ? ?  ?AM-PAC PT "6 Clicks" Mobility   ?Outcome Measure ? Help  needed turning from your back to your side while in a flat bed without using bedrails?: None ?Help needed moving from lying on your back to sitting on the side of a flat bed without using bedrails?: None ?Help needed moving to and from a bed to a chair (including a wheelchair)?: A Little ?Help needed standing up from a chair using your arms (e.g., wheelchair or bedside chair)?: A Little ?Help needed to walk in hospital room?: A Little ?Help needed climbing 3-5 steps with a railing? : A Little ?6 Click Score: 20 ? ?  ?End of Session   ?Activity Tolerance: Patient tolerated treatment well ?Patient left: in chair;with call bell/phone within reach;with chair alarm set ?Nurse Communication: Mobility status ?PT Visit Diagnosis: Other abnormalities of gait and mobility (R26.89);Other symptoms and signs involving the nervous system (R29.898) ?  ? ? ?Time: 8850-2774 ?PT Time Calculation (min) (ACUTE ONLY): 37 min ? ?Charges:  $Gait Training: 8-22 mins ?$Therapeutic Activity: 8-22 mins          ?          ? ?06/16/2021 ? ?Ginger Carne., PT ?Acute Rehabilitation Services ?[phone]  (pager) ?[phone]  (office) ? ? ?Tessie Fass Kamori Barbier ?06/16/2021, 11:10 AM ? ?

## 2021-06-16 NOTE — Discharge Summary (Signed)
Physician Discharge Summary  ?Daniel Herrera:[id number] DOB: 07-09-1940 DOA: 06/14/2021 ? ?PCP: [name], NP ? ?Admit date: 06/14/2021 ?Discharge date: 06/16/2021 ? ?Admitted From: Home ?Disposition: Home with home health PT/OT ? ?Recommendations for Outpatient Follow-up:  ?Follow up with PCP in 1-2 weeks ?Neurology office will call for follow-up. ? ?Home Health: PT/OT ?Equipment/Devices: N/A ? ?Discharge Condition: Stable ?CODE STATUS: Full code ?Diet recommendation: Low-salt diet ? ?Discharge summary: ? ?81 year old gentleman from home with history of coronary artery disease, stage IIIa CKD, dyslipidemia, GERD, hypertension, hereditary angioedema on chronic maintenance therapy with injectables presented to the ER with acute onset of dizziness and imbalance, leaning to the left side.  In the emergency room hemodynamically stable.  CT head with no acute intracranial abnormality.  MRI brain with left cerebellar stroke.  Admitted with acute stroke.  Underwent a stroke work-up, neurology evaluation.  Clinically improving today.  Able to go home with home health PT OT. ?  ?Acute ischemic stroke, left cerebellar stroke: ?Clinical findings, loss of balance and left leaning. ?CT head findings, no initial intracranial abnormality. ?MRI of the brain, left PICA territory stroke. ?CTA of the head and neck, no hemodynamically significant stenosis in the neck, severe focal narrowing of the proximal left P2 and moderate narrowing of the right P2, focal narrowing in the left A2 and distal right V4. ?Suspected left V4 acute occlusion. ?2D echocardiogram, essentially normal. ?Antiplatelet therapy, on aspirin at home.  Neurology recommended aspirin and Plavix. ?Patient will take aspirin 325 mg daily for 3 months, Plavix 75 mg daily lifelong. ?Patient taking Livalo 4 mg daily.  Intolerance to statins.  Add Zetia 10 mg daily. ?Hemoglobin A1c, 5.6.  Very well controlled.  On glipizide at home. ?Patient initially with difficulty  mobility, however during hospitalizations his mobility gradually improved and was able to mobilize with some support.  Will benefit with home health PT. ?  ?Chronic medical issues including  ?hypothyroidism: Euthyroid on Synthroid. ?Coronary artery disease: Stable.  On statin, aspirin, nitroglycerin and beta-blockers. ?Type 2 diabetes, well controlled with no complications: Continue glipizide. ?GERD: On PPI continued from home. ?Hereditary angioedema: Stable.  On chronic maintenance treatment.  Followed by allergy. ? ?Patient is stable to transition home with home health PT. ?  ? ?  ? ? ? ?Discharge Diagnoses:  ?Principal Problem: ?  Cerebellar stroke ([place]) ?Active Problems: ?  Hypothyroidism, unspecified ?  Coronary artery disease ?  Type 2 diabetes mellitus without complications ([place]) ?  GERD without esophagitis ?  Essential hypertension ?  Stroke (cerebrum) ([place]) ? ? ? ?Discharge Instructions ? ?Discharge Instructions   ? ? Ambulatory referral to Neurology   Complete by: As directed ?  ? Follow up with [facility] Jaynee [facility] at [facility] in 4-6 weeks.  Patient is Dr Cathren Laine patient. Thanks.  ? Diet - low sodium heart healthy   Complete by: As directed ?  ? Increase activity slowly   Complete by: As directed ?  ? ?  ? ?Allergies as of 06/16/2021   ? ?   Reactions  ? Lisinopril Anaphylaxis, Swelling, Other (See Comments)  ? Face, lips, and throat swell  ? Oxycodone Other (See Comments)  ? Hallucination  ? Tape Other (See Comments)  ? PLASTIC TAPE TEARS OFF THE SKIN- Please use an alternative!!  ? Benadryl [diphenhydramine Hcl] Swelling, Other (See Comments)  ? Facial swelling from high dose (tolerates Advil PM as needed)  ? Darvocet [propoxyphene N-acetaminophen] Other (See Comments)  ? Hallucinations  ? Darvon [  propoxyphene Hcl] Other (See Comments)  ? Hallucination  ? Tylenol [acetaminophen] Swelling, Other (See Comments)  ? Foot swelling  ? ?  ? ?  ?Medication List  ?  ? ?STOP taking these medications   ? ?metoprolol tartrate 25 MG  tablet ?Commonly known as: LOPRESSOR ?  ?NONFORMULARY OR COMPOUNDED ITEM ?  ? ?  ? ?TAKE these medications   ? ?aspirin 325 MG EC tablet ?Take 1 tablet (325 mg total) by mouth daily. ?Start taking on: June 17, 2021 ?What changed:  ?medication strength ?how much to take ?additional instructions ?  ?Azelastine HCl 137 MCG/SPRAY Soln ?Place 1 spray into both nostrils 2 (two) times daily. ?  ?betamethasone valerate ointment 0.1 % ?Commonly known as: VALISONE ?Apply 1 application topically 2 (two) times daily as needed for rash. ?  ?clopidogrel 75 MG tablet ?Commonly known as: PLAVIX ?Take 1 tablet (75 mg total) by mouth daily. ?Start taking on: June 17, 2021 ?  ?dextromethorphan-guaiFENesin 30-600 MG 12hr tablet ?Commonly known as: [place] DM ?Take 1 tablet by mouth in the morning and at bedtime. ?  ?EPINEPHrine 0.3 mg/0.3 mL Soaj injection ?Commonly known as: EPI-PEN ?Inject 0.3 mg into the muscle as needed for anaphylaxis. ?  ?ezetimibe 10 MG tablet ?Commonly known as: ZETIA ?Take 1 tablet (10 mg total) by mouth daily. ?Start taking on: June 17, 2021 ?  ?fluoruracil 0.5 % cream ?Commonly known as: CARAC ?Apply 1 application topically as needed for rash. For skin cancer flare up ?  ?fluticasone 50 MCG/ACT nasal spray ?Commonly known as: FLONASE ?Place 1 spray into both nostrils 2 (two) times daily. ?  ?glipiZIDE 2.5 MG 24 hr tablet ?Commonly known as: GLUCOTROL XL ?Take 2.5 mg by mouth every morning. ?  ?icatibant 30 MG/3ML injection ?Commonly known as: FIRAZYR ?Inject 3 mLs (30 mg total) into the skin once as needed (for sudden attacks of hereditary angioedema). ?  ?levothyroxine 100 MCG tablet ?Commonly known as: SYNTHROID ?Take 100 mcg by mouth daily before breakfast. ?  ?Livalo 4 MG Tabs ?Generic drug: Pitavastatin Calcium ?Take 4 mg by mouth every evening. ?  ?magic mouthwash Soln ?Take 15 mLs by mouth 3 (three) times daily as needed for mouth pain. ?  ?metoprolol succinate 25 MG 24 hr tablet ?Commonly known  as: TOPROL-XL ?Take 25 mg by mouth daily. ?  ?nabumetone 750 MG tablet ?Commonly known as: RELAFEN ?Take 750 mg by mouth 2 (two) times daily. ?  ?nitroGLYCERIN 0.4 MG SL tablet ?Commonly known as: NITROSTAT ?Place 0.4 mg under the tongue every 5 (five) minutes as needed for chest pain. ?  ?OVER THE COUNTER MEDICATION ?Take 1 capsule by mouth in the morning and at bedtime. Health Plus/Super Colon Cleanse ?  ?pantoprazole 40 MG tablet ?Commonly known as: PROTONIX ?Take 40 mg by mouth 2 (two) times daily. ?  ?PROBIOTIC DAILY PO ?Take 1 capsule by mouth daily. ?  ?senna 8.6 MG Tabs tablet ?Commonly known as: SENOKOT ?Take 2 tablets (17.2 mg total) by mouth 2 (two) times daily. ?  ?Takhzyro 300 MG/2ML Sosy ?Generic drug: Lanadelumab-flyo ?Inject 300 mg into the skin every 14 (fourteen) days. ?  ?Vitamin B-12 5000 MCG Tbdp ?Take 5,000 mcg by mouth daily. ?  ? ?  ? ? Follow-up Information   ? ? Melvenia Beam, MD. Schedule an appointment as soon as possible for a visit in 1 month(s).   ?Specialty: Neurology ?Contact information: ?[address] ?[address] ?[place] [address] ?[phone] ? ? ?  ?  ? ?  [facility] Follow up.   ?Specialty: Home Health Services ?Why: The home health agency will contact you for the first home visit. ?Contact information: ?[address] ?[address] ?[place] [address] ?[phone] ? ? ?  ?  ? ?  ?  ? ?  ? ?Allergies  ?Allergen Reactions  ? Lisinopril Anaphylaxis, Swelling and Other (See Comments)  ?  Face, lips, and throat swell  ? Oxycodone Other (See Comments)  ?  Hallucination  ? Tape Other (See Comments)  ?  PLASTIC TAPE TEARS OFF THE SKIN- Please use an alternative!!  ? Benadryl [Diphenhydramine Hcl] Swelling and Other (See Comments)  ?  Facial swelling from high dose (tolerates Advil PM as needed)  ? Darvocet [Propoxyphene N-Acetaminophen] Other (See Comments)  ?  Hallucinations ?  ? Darvon [Propoxyphene Hcl] Other (See Comments)  ?  Hallucination ?  ? Tylenol  [Acetaminophen] Swelling and Other (See Comments)  ?  Foot swelling  ? ? ?Consultations: ?Neurology ? ? ?Procedures/Studies: ?CT ANGIO HEAD NECK W WO CM ? ?Result Date: 06/14/2021 ?CLINICAL DATA:  Stroke on same-day M

## 2021-06-16 NOTE — Evaluation (Signed)
Speech Language Pathology Evaluation ?Patient Details ?Name: Daniel Herrera ?MRN: [id number] ?DOB: [birth year] ?Today's Date: 06/16/2021 ?Time: 2263-3354 ?SLP Time Calculation (min) (ACUTE ONLY): 17 min ? ?Problem List:  ?Patient Active Problem List  ? Diagnosis Date Noted  ? Hypothyroidism, unspecified 06/15/2021  ? GERD without esophagitis 06/15/2021  ? Type 2 diabetes mellitus without complications ([place]) 56/25/6389  ? Stroke (cerebrum) ([place]) 06/15/2021  ? Cerebellar stroke ([place]) 06/14/2021  ? Open metatarsal fracture 09/02/2020  ? Dysrhythmia   ? Geographic tongue   ? History of kidney stones   ? Hypothyroidism   ? Interstitial lung disease ([place])   ? Peripheral vascular disease ([place])   ? Pre-diabetes   ? Urticaria   ? Thyroid disease   ? Skin cancer   ? Prostate enlargement   ? Prostate cancer ([place])   ? Personal history of digestive disease   ? Myocardial infarction [facility])   ? Hypertension   ? Hx of umbilical hernia repair   ? Hx of Clostridium difficile infection   ? History of prolonged Q-T interval on ECG   ? High cholesterol   ? Gout   ? GERD (gastroesophageal reflux disease)   ? Arthritis   ? Anxiety   ? Angio-edema   ? Cataract   ? Diverticular hemorrhage   ? Lower GI bleed 06/03/2019  ? Acute lower GI bleeding 05/28/2019  ? Coronary artery disease 05/28/2019  ? Diverticulosis   ? Glossitis, benign migratory 12/04/2018  ? Hoarseness 12/04/2018  ? PAF (paroxysmal atrial fibrillation) ([employer]) 06/13/2018  ? S/P CABG x 3 05/28/2018  ? NSTEMI (non-ST elevated myocardial infarction) ([place]) 05/21/2018  ? Tachycardia determined by examination of pulse   ? CHF (congestive heart failure) ([place]) 2020  ? Pre-operative clearance 01/02/2018  ? Hematochezia 12/21/2017  ? CKD (chronic kidney disease) stage 3, GFR 30-59 ml/min 12/21/2017  ? GI bleed 12/21/2017  ? Hereditary angioedema type 2 ([place]) 07/28/2017  ? Enterotoxigenic Escherichia coli infection 05/24/2015  ? STEC (Shiga toxin-producing Escherichia coli) infection  05/24/2015  ? Colitis 05/23/2015  ? History of prostate cancer   ? Chronic diarrhea of unknown origin   ? CAD S/P percutaneous coronary angioplasty   ? Type 2 diabetes mellitus with unspecified complications (HCC)   ? Palpitations 03/15/2015  ? History of non-ST elevation myocardial infarction (NSTEMI) 02/22/2015  ? Abnormal EKG 02/21/2015  ? Essential hypertension 02/21/2015  ? Dyslipidemia, goal LDL below 70 02/21/2015  ? Malignant neoplasm of prostate ([place]) 08/07/2014  ? Anginal pain ([place]) 2016  ? ILD (interstitial lung disease) ([place]) 12/17/2013  ? ?Past Medical History:  ?Past Medical History:  ?Diagnosis Date  ? Abnormal EKG 02/21/2015  ? Acute lower GI bleeding 05/28/2019  ? Anginal pain ([place]) 2016  ? Angio-edema   ? Anxiety   ? Arthritis   ? CAD (coronary artery disease)   ? a. 02/2015: DES to mid-LAD  ? CAD S/P percutaneous coronary angioplasty   ? NSTEMI treated with LAD PCI with DES x 03 Feb 2015 Myoview low risk Nov 2017  ? Cataract   ? CHF (congestive heart failure) ([place]) 2020  ? after surgery only  ? Chronic diarrhea of unknown origin   ? CKD (chronic kidney disease) stage 3, GFR 30-59 ml/min 12/21/2017  ? Colitis 05/23/2015  ? Diverticular hemorrhage   ? Diverticulosis   ? Dyslipidemia, goal LDL below 70 02/21/2015  ? hyperlipidemia   ? Dysrhythmia   ? A-fib  ? Enterotoxigenic Escherichia coli infection 05/24/2015  ?  Essential hypertension 02/21/2015  ? Geographic tongue   ? GERD (gastroesophageal reflux disease)   ? GI bleed 12/21/2017  ? Glossitis, benign migratory 12/04/2018  ? Gout   ? Hematochezia 12/21/2017  ? Admitted Sept 2019 with lower GI bleeding felt to be secondary to hemorrhoids  ? Hereditary angioedema type 2 ([place]) 07/28/2017  ? High cholesterol   ? History of kidney stones   ? History of non-ST elevation myocardial infarction (NSTEMI) 02/22/2015  ? Nov 2016  ? History of prolonged Q-T interval on ECG   ? History of prostate cancer   ? Hoarseness 12/04/2018  ? Hx of Clostridium difficile infection    ? Hx of umbilical hernia repair   ? Hypertension   ? Hypothyroidism   ? ILD (interstitial lung disease) ([place]) 12/17/2013  ? ?related to chicken farm exposure or aspiration pneumonitis from GERD Previously attributed to acute interstitial pneumonitis but based on HRCT this is likely IPF, "probable UIP"  ? Interstitial lung disease ([place])   ? Lower GI bleed 06/03/2019  ? Malignant neoplasm of prostate ([place]) 08/07/2014  ? Myocardial infarction [facility]) 2016  ? NSTEMI (non-ST elevated myocardial infarction) ([place]) 05/21/2018  ? Open metatarsal fracture 09/02/2020  ? PAF (paroxysmal atrial fibrillation) ([place]) 06/13/2018  ? Post CABG- discharged on Amiodarone, he will probably not need this long term  ? Palpitations 03/15/2015  ? palpitations   ? Peripheral vascular disease ([name])   ? vericose veins  ? Personal history of digestive disease   ? gastric ulcer  ? Pre-diabetes   ? Pre-operative clearance 01/02/2018  ? Prostate cancer ([place])   ? Prostate enlargement   ? S/P CABG x 3 05/28/2018  ?  LIMA-LAD, RIMA-RCA, and Lt radial to OM.  ? Skin cancer   ? basal cell carcinoma  ? STEC (Shiga toxin-producing Escherichia coli) infection 05/24/2015  ? Tachycardia determined by examination of pulse   ? Thyroid disease   ? Type 2 diabetes mellitus with unspecified complications (HCC)   ? Urticaria   ? ?Past Surgical History:  ?Past Surgical History:  ?Procedure Laterality Date  ? APPENDECTOMY    ? BACK SURGERY    ? CARDIAC CATHETERIZATION N/A 02/21/2015  ? Procedure: Left Heart Cath;  Surgeon: Lorretta Harp, MD;  Location: [place] [facility];  Service: Cardiovascular;  Laterality: N/A;  ? CATARACT EXTRACTION, BILATERAL    ? CORONARY ARTERY BYPASS GRAFT N/A 05/24/2018  ? Procedure: CORONARY ARTERY BYPASS GRAFTING (CABG), ON PUMP, TIMES THREE, USING BILATERAL INTERNAL MAMMARY ARTERY AND HARVESTED LEFT RADIAL ARTERY;  Surgeon: Gaye Pollack, MD;  Location: [place] [facility];  Service: Open Heart Surgery;  Laterality: N/A;  ? CYSTOSCOPY WITH LITHOLAPAXY N/A  07/26/2020  ? Procedure: CYSTOSCOPY WITH LITHOLAPAXY WITH FULGERATION;  Surgeon: Raynelle Bring, MD;  [facility];  Service: Urology;  Laterality: N/A;  ? HOLMIUM LASER APPLICATION N/A [id number]/99/8338  ? Procedure: HOLMIUM LASER APPLICATION;  Surgeon: Raynelle Bring, MD;  [facility];  Service: Urology;  Laterality: N/A;  ? I & D EXTREMITY Right 09/02/2020  ? Procedure: IRRIGATION AND DEBRIDEMENT RIGHT 2ND AND 3RD METATARSAL;  Surgeon: Wylene Simmer, MD;  Location: [place];  Service: Orthopedics;  Laterality: Right;  ? LEFT HEART CATH AND CORONARY ANGIOGRAPHY N/A 05/22/2018  ? Procedure: LEFT HEART CATH AND CORONARY ANGIOGRAPHY;  Surgeon: Jettie Booze, MD;  Location: [place] [facility];  Service: Cardiovascular;  Laterality: N/A;  ? NASAL SINUS SURGERY    ? ORIF TOE FRACTURE Right 09/02/2020  ?  Procedure: OPEN REDUCTION INTERNAL FIXATION (ORIF) OF RIGHT OPEN 2ND AND 3RD METATARSAL (TOE) OPEN FRACTURE;  Surgeon: Wylene Simmer, MD;  Location: [place];  Service: Orthopedics;  Laterality: Right;  ? PROSTATE BIOPSY    ? RADIAL ARTERY HARVEST Left 05/24/2018  ? Procedure: RADIAL ARTERY HARVEST;  Surgeon: Gaye Pollack, MD;  Location: [place];  Service: Open Heart Surgery;  Laterality: Left;  ? right shoulder rotator cuff repair    ? TEE WITHOUT CARDIOVERSION N/A 05/24/2018  ? Procedure: TRANSESOPHAGEAL ECHOCARDIOGRAM (TEE);  Surgeon: Gaye Pollack, MD;  Location: [place];  Service: Open Heart Surgery;  Laterality: N/A;  ? ?HPI:  ?Daniel Herrera is a 81 y.o. male who presented to the ER with acute onset of dizziness and imbalance with a feeling of leaning to the left. MRI revealed acute L cerebellum infact, L PICA territory. Loss of L vertebral arter flow void. PMH: cardiac disease status post PCI and stent, CHF, stage III CKD, diverticulosis, dyslipidemia, GERD, hypertension, urolithiasis, CABGx3 2/20, ORIF R ankle 09/2020  ? ?Assessment / Plan / Recommendation ?Clinical Impression ? Pt is pleasant, jovial and cooperative  for assessment. Speech is intelligible and language intact. 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 On the SLUMS evaluation he scored a 21/30 in the lower end of the mild category. Points missed

## 2021-06-21 DIAGNOSIS — R42 Dizziness and giddiness: Secondary | ICD-10-CM | POA: Diagnosis not present

## 2021-06-21 DIAGNOSIS — I10 Essential (primary) hypertension: Secondary | ICD-10-CM | POA: Diagnosis not present

## 2021-06-21 DIAGNOSIS — E785 Hyperlipidemia, unspecified: Secondary | ICD-10-CM | POA: Diagnosis not present

## 2021-06-21 DIAGNOSIS — Z79899 Other long term (current) drug therapy: Secondary | ICD-10-CM | POA: Diagnosis not present

## 2021-06-21 DIAGNOSIS — K589 Irritable bowel syndrome without diarrhea: Secondary | ICD-10-CM | POA: Diagnosis not present

## 2021-06-21 DIAGNOSIS — I6930 Unspecified sequelae of cerebral infarction: Secondary | ICD-10-CM | POA: Diagnosis not present

## 2021-06-21 DIAGNOSIS — E1169 Type 2 diabetes mellitus with other specified complication: Secondary | ICD-10-CM | POA: Diagnosis not present

## 2021-06-24 DIAGNOSIS — I509 Heart failure, unspecified: Secondary | ICD-10-CM | POA: Diagnosis not present

## 2021-06-24 DIAGNOSIS — D841 Defects in the complement system: Secondary | ICD-10-CM | POA: Diagnosis not present

## 2021-06-24 DIAGNOSIS — I130 Hypertensive heart and chronic kidney disease with heart failure and stage 1 through stage 4 chronic kidney disease, or unspecified chronic kidney disease: Secondary | ICD-10-CM | POA: Diagnosis not present

## 2021-06-24 DIAGNOSIS — J320 Chronic maxillary sinusitis: Secondary | ICD-10-CM | POA: Diagnosis not present

## 2021-06-24 DIAGNOSIS — I252 Old myocardial infarction: Secondary | ICD-10-CM | POA: Diagnosis not present

## 2021-06-24 DIAGNOSIS — N1831 Chronic kidney disease, stage 3a: Secondary | ICD-10-CM | POA: Diagnosis not present

## 2021-06-24 DIAGNOSIS — I25119 Atherosclerotic heart disease of native coronary artery with unspecified angina pectoris: Secondary | ICD-10-CM | POA: Diagnosis not present

## 2021-06-24 DIAGNOSIS — I480 Paroxysmal atrial fibrillation: Secondary | ICD-10-CM | POA: Diagnosis not present

## 2021-06-24 DIAGNOSIS — F419 Anxiety disorder, unspecified: Secondary | ICD-10-CM | POA: Diagnosis not present

## 2021-06-24 DIAGNOSIS — K219 Gastro-esophageal reflux disease without esophagitis: Secondary | ICD-10-CM | POA: Diagnosis not present

## 2021-06-24 DIAGNOSIS — R42 Dizziness and giddiness: Secondary | ICD-10-CM | POA: Diagnosis not present

## 2021-06-24 DIAGNOSIS — K5790 Diverticulosis of intestine, part unspecified, without perforation or abscess without bleeding: Secondary | ICD-10-CM | POA: Diagnosis not present

## 2021-06-24 DIAGNOSIS — Z79899 Other long term (current) drug therapy: Secondary | ICD-10-CM | POA: Diagnosis not present

## 2021-06-24 DIAGNOSIS — J0100 Acute maxillary sinusitis, unspecified: Secondary | ICD-10-CM | POA: Diagnosis not present

## 2021-06-24 DIAGNOSIS — R2689 Other abnormalities of gait and mobility: Secondary | ICD-10-CM | POA: Diagnosis not present

## 2021-06-24 DIAGNOSIS — Z7982 Long term (current) use of aspirin: Secondary | ICD-10-CM | POA: Diagnosis not present

## 2021-06-24 DIAGNOSIS — E1122 Type 2 diabetes mellitus with diabetic chronic kidney disease: Secondary | ICD-10-CM | POA: Diagnosis not present

## 2021-06-24 DIAGNOSIS — Z7902 Long term (current) use of antithrombotics/antiplatelets: Secondary | ICD-10-CM | POA: Diagnosis not present

## 2021-06-24 DIAGNOSIS — E039 Hypothyroidism, unspecified: Secondary | ICD-10-CM | POA: Diagnosis not present

## 2021-06-24 DIAGNOSIS — J849 Interstitial pulmonary disease, unspecified: Secondary | ICD-10-CM | POA: Diagnosis not present

## 2021-06-24 DIAGNOSIS — E1151 Type 2 diabetes mellitus with diabetic peripheral angiopathy without gangrene: Secondary | ICD-10-CM | POA: Diagnosis not present

## 2021-06-24 DIAGNOSIS — E7800 Pure hypercholesterolemia, unspecified: Secondary | ICD-10-CM | POA: Diagnosis not present

## 2021-06-24 DIAGNOSIS — I8390 Asymptomatic varicose veins of unspecified lower extremity: Secondary | ICD-10-CM | POA: Diagnosis not present

## 2021-06-24 DIAGNOSIS — M109 Gout, unspecified: Secondary | ICD-10-CM | POA: Diagnosis not present

## 2021-06-24 DIAGNOSIS — I69398 Other sequelae of cerebral infarction: Secondary | ICD-10-CM | POA: Diagnosis not present

## 2021-06-27 DIAGNOSIS — E1122 Type 2 diabetes mellitus with diabetic chronic kidney disease: Secondary | ICD-10-CM | POA: Diagnosis not present

## 2021-06-27 DIAGNOSIS — R42 Dizziness and giddiness: Secondary | ICD-10-CM | POA: Diagnosis not present

## 2021-06-27 DIAGNOSIS — I130 Hypertensive heart and chronic kidney disease with heart failure and stage 1 through stage 4 chronic kidney disease, or unspecified chronic kidney disease: Secondary | ICD-10-CM | POA: Diagnosis not present

## 2021-06-27 DIAGNOSIS — R2689 Other abnormalities of gait and mobility: Secondary | ICD-10-CM | POA: Diagnosis not present

## 2021-06-27 DIAGNOSIS — I509 Heart failure, unspecified: Secondary | ICD-10-CM | POA: Diagnosis not present

## 2021-06-27 DIAGNOSIS — I69398 Other sequelae of cerebral infarction: Secondary | ICD-10-CM | POA: Diagnosis not present

## 2021-06-28 DIAGNOSIS — I509 Heart failure, unspecified: Secondary | ICD-10-CM | POA: Diagnosis not present

## 2021-06-28 DIAGNOSIS — R42 Dizziness and giddiness: Secondary | ICD-10-CM | POA: Diagnosis not present

## 2021-06-28 DIAGNOSIS — I130 Hypertensive heart and chronic kidney disease with heart failure and stage 1 through stage 4 chronic kidney disease, or unspecified chronic kidney disease: Secondary | ICD-10-CM | POA: Diagnosis not present

## 2021-06-28 DIAGNOSIS — I69398 Other sequelae of cerebral infarction: Secondary | ICD-10-CM | POA: Diagnosis not present

## 2021-06-28 DIAGNOSIS — E1122 Type 2 diabetes mellitus with diabetic chronic kidney disease: Secondary | ICD-10-CM | POA: Diagnosis not present

## 2021-06-28 DIAGNOSIS — R2689 Other abnormalities of gait and mobility: Secondary | ICD-10-CM | POA: Diagnosis not present

## 2021-07-04 DIAGNOSIS — I69398 Other sequelae of cerebral infarction: Secondary | ICD-10-CM | POA: Diagnosis not present

## 2021-07-04 DIAGNOSIS — I509 Heart failure, unspecified: Secondary | ICD-10-CM | POA: Diagnosis not present

## 2021-07-04 DIAGNOSIS — I130 Hypertensive heart and chronic kidney disease with heart failure and stage 1 through stage 4 chronic kidney disease, or unspecified chronic kidney disease: Secondary | ICD-10-CM | POA: Diagnosis not present

## 2021-07-04 DIAGNOSIS — R42 Dizziness and giddiness: Secondary | ICD-10-CM | POA: Diagnosis not present

## 2021-07-04 DIAGNOSIS — E1122 Type 2 diabetes mellitus with diabetic chronic kidney disease: Secondary | ICD-10-CM | POA: Diagnosis not present

## 2021-07-04 DIAGNOSIS — R2689 Other abnormalities of gait and mobility: Secondary | ICD-10-CM | POA: Diagnosis not present

## 2021-07-05 DIAGNOSIS — E1122 Type 2 diabetes mellitus with diabetic chronic kidney disease: Secondary | ICD-10-CM | POA: Diagnosis not present

## 2021-07-05 DIAGNOSIS — I509 Heart failure, unspecified: Secondary | ICD-10-CM | POA: Diagnosis not present

## 2021-07-05 DIAGNOSIS — R2689 Other abnormalities of gait and mobility: Secondary | ICD-10-CM | POA: Diagnosis not present

## 2021-07-05 DIAGNOSIS — R42 Dizziness and giddiness: Secondary | ICD-10-CM | POA: Diagnosis not present

## 2021-07-05 DIAGNOSIS — I69398 Other sequelae of cerebral infarction: Secondary | ICD-10-CM | POA: Diagnosis not present

## 2021-07-05 DIAGNOSIS — I130 Hypertensive heart and chronic kidney disease with heart failure and stage 1 through stage 4 chronic kidney disease, or unspecified chronic kidney disease: Secondary | ICD-10-CM | POA: Diagnosis not present

## 2021-07-06 DIAGNOSIS — R42 Dizziness and giddiness: Secondary | ICD-10-CM | POA: Diagnosis not present

## 2021-07-06 DIAGNOSIS — E1122 Type 2 diabetes mellitus with diabetic chronic kidney disease: Secondary | ICD-10-CM | POA: Diagnosis not present

## 2021-07-06 DIAGNOSIS — I69398 Other sequelae of cerebral infarction: Secondary | ICD-10-CM | POA: Diagnosis not present

## 2021-07-06 DIAGNOSIS — R2689 Other abnormalities of gait and mobility: Secondary | ICD-10-CM | POA: Diagnosis not present

## 2021-07-06 DIAGNOSIS — I509 Heart failure, unspecified: Secondary | ICD-10-CM | POA: Diagnosis not present

## 2021-07-06 DIAGNOSIS — I130 Hypertensive heart and chronic kidney disease with heart failure and stage 1 through stage 4 chronic kidney disease, or unspecified chronic kidney disease: Secondary | ICD-10-CM | POA: Diagnosis not present

## 2021-07-07 ENCOUNTER — Encounter: Payer: Self-pay | Admitting: Neurology

## 2021-07-07 ENCOUNTER — Ambulatory Visit (INDEPENDENT_AMBULATORY_CARE_PROVIDER_SITE_OTHER): Payer: Medicare Other | Admitting: Allergy and Immunology

## 2021-07-07 ENCOUNTER — Ambulatory Visit (INDEPENDENT_AMBULATORY_CARE_PROVIDER_SITE_OTHER): Payer: Medicare Other | Admitting: Neurology

## 2021-07-07 ENCOUNTER — Encounter: Payer: Self-pay | Admitting: Allergy and Immunology

## 2021-07-07 VITALS — BP 130/82 | HR 83 | Resp 16 | Ht 73.0 in | Wt 194.4 lb

## 2021-07-07 VITALS — BP 141/82 | HR 74 | Ht 73.0 in | Wt 194.4 lb

## 2021-07-07 DIAGNOSIS — J849 Interstitial pulmonary disease, unspecified: Secondary | ICD-10-CM

## 2021-07-07 DIAGNOSIS — K219 Gastro-esophageal reflux disease without esophagitis: Secondary | ICD-10-CM | POA: Diagnosis not present

## 2021-07-07 DIAGNOSIS — D841 Defects in the complement system: Secondary | ICD-10-CM | POA: Diagnosis not present

## 2021-07-07 DIAGNOSIS — L570 Actinic keratosis: Secondary | ICD-10-CM

## 2021-07-07 DIAGNOSIS — J3089 Other allergic rhinitis: Secondary | ICD-10-CM

## 2021-07-07 DIAGNOSIS — I639 Cerebral infarction, unspecified: Secondary | ICD-10-CM | POA: Diagnosis not present

## 2021-07-07 DIAGNOSIS — M3500 Sicca syndrome, unspecified: Secondary | ICD-10-CM

## 2021-07-07 MED ORDER — ASPIRIN 81 MG PO TBEC: 81.0000 mg | DELAYED_RELEASE_TABLET | Freq: Every day | ORAL | 11 refills | Status: AC

## 2021-07-07 NOTE — Patient Instructions (Addendum)
Decrease ASA to '81mg'$  ?Continue Aspirin '81mg'$  one pill and plavix '75mg'$  one pill once daily (morning or night, together or separate) ?On May 13th can stop the aspirin and continue the plavix only long term ?Continue other medications as well ? ?Stroke Prevention ?Some medical conditions and behaviors can lead to a higher chance of having a stroke. You can help prevent a stroke by eating healthy, exercising, not smoking, and managing any medical conditions you have. ?Stroke is a leading cause of functional impairment. Primary prevention is particularly important because a majority of strokes are first-time events. Stroke changes the lives of not only those who experience a stroke but also their family and other caregivers. ?How can this condition affect me? ?A stroke is a medical emergency and should be treated right away. A stroke can lead to brain damage and can sometimes be life-threatening. If a person gets medical treatment right away, there is a better chance of surviving and recovering from a stroke. ?What can increase my risk? ?The following medical conditions may increase your risk of a stroke: ?Cardiovascular disease. ?High blood pressure (hypertension). ?Diabetes. ?High cholesterol. ?Sickle cell disease. ?Blood clotting disorders (hypercoagulable state). ?Obesity. ?Sleep disorders (obstructive sleep apnea). ?Other risk factors include: ?Being older than age 25. ?Having a history of blood clots, stroke, or mini-stroke (transient ischemic attack, TIA). ?Genetic factors, such as race, ethnicity, or a family history of stroke. ?Smoking cigarettes or using other tobacco products. ?Taking birth control pills, especially if you also use tobacco. ?Heavy use of alcohol or drugs, especially cocaine and methamphetamine. ?Physical inactivity. ?What actions can I take to prevent this? ?Manage your health conditions ?High cholesterol levels. ?Eating a healthy diet is important for preventing high cholesterol. If  cholesterol cannot be managed through diet alone, you may need to take medicines. ?Take any prescribed medicines to control your cholesterol as told by your health care provider. ?Hypertension. ?To reduce your risk of stroke, try to keep your blood pressure below 130/80. ?Eating a healthy diet and exercising regularly are important for controlling blood pressure. If these steps are not enough to manage your blood pressure, you may need to take medicines. ?Take any prescribed medicines to control hypertension as told by your health care provider. ?Ask your health care provider if you should monitor your blood pressure at home. ?Have your blood pressure checked every year, even if your blood pressure is normal. Blood pressure increases with age and some medical conditions. ?Diabetes. ?Eating a healthy diet and exercising regularly are important parts of managing your blood sugar (glucose). If your blood sugar cannot be managed through diet and exercise, you may need to take medicines. ?Take any prescribed medicines to control your diabetes as told by your health care provider. ?Get evaluated for obstructive sleep apnea. Talk to your health care provider about getting a sleep evaluation if you snore a lot or have excessive sleepiness. ?Make sure that any other medical conditions you have, such as atrial fibrillation or atherosclerosis, are managed. ?Nutrition ?Follow instructions from your health care provider about what to eat or drink to help manage your health condition. These instructions may include: ?Reducing your daily calorie intake. ?Limiting how much salt (sodium) you use to 1,500 milligrams (mg) each day. ?Using only healthy fats for cooking, such as olive oil, canola oil, or sunflower oil. ?Eating healthy foods. You can do this by: ?Choosing foods that are high in fiber, such as whole grains, and fresh fruits and vegetables. ?Eating at least 5 servings of  fruits and vegetables a day. Try to fill one-half of  your plate with fruits and vegetables at each meal. ?Choosing lean protein foods, such as lean cuts of meat, poultry without skin, fish, tofu, beans, and nuts. ?Eating low-fat dairy products. ?Avoiding foods that are high in sodium. This can help lower blood pressure. ?Avoiding foods that have saturated fat, trans fat, and cholesterol. This can help prevent high cholesterol. ?Avoiding processed and prepared foods. ?Counting your daily carbohydrate intake. ? ?Lifestyle ?If you drink alcohol: ?Limit how much you have to: ?0-1 drink a day for women who are not pregnant. ?0-2 drinks a day for men. ?Know how much alcohol is in your drink. In the [place] one drink equals one 12 oz bottle of beer (380m), one 5 oz glass of wine (1449m, or one 1? oz glass of hard liquor (4411m ?Do not use any products that contain nicotine or tobacco. These products include cigarettes, chewing tobacco, and vaping devices, such as e-cigarettes. If you need help quitting, ask your health care provider. ?Avoid secondhand smoke. ?Do not use drugs. ?Activity ? ?Try to stay at a healthy weight. ?Get at least 30 minutes of exercise on most days, such as: ?Fast walking. ?Biking. ?Swimming. ?Medicines ?Take over-the-counter and prescription medicines only as told by your health care provider. Aspirin or blood thinners (antiplatelets or anticoagulants) may be recommended to reduce your risk of forming blood clots that can lead to stroke. ?Avoid taking birth control pills. Talk to your health care provider about the risks of taking birth control pills if: ?You are over 35 28ars old. ?You smoke. ?You get very bad headaches. ?You have had a blood clot. ?Where to find more information ?American Stroke Association: www.strokeassociation.org ?Get help right away if: ?You or a loved one has any symptoms of a stroke. "BE FAST" is an easy way to remember the main warning signs of a stroke: ?B - Balance. Signs are dizziness, sudden trouble walking, or loss of  balance. ?E - Eyes. Signs are trouble seeing or a sudden change in vision. ?F - Face. Signs are sudden weakness or numbness of the face, or the face or eyelid drooping on one side. ?A - Arms. Signs are weakness or numbness in an arm. This happens suddenly and usually on one side of the body. ?S - Speech. Signs are sudden trouble speaking, slurred speech, or trouble understanding what people say. ?T - Time. Time to call emergency services. Write down what time symptoms started. ?You or a loved one has other signs of a stroke, such as: ?A sudden, severe headache with no known cause. ?Nausea or vomiting. ?Seizure. ?These symptoms may represent a serious problem that is an emergency. Do not wait to see if the symptoms will go away. Get medical help right away. Call your local emergency services (911 in the [place] Do not drive yourself to the hospital. ?Summary ?You can help to prevent a stroke by eating healthy, exercising, not smoking, limiting alcohol intake, and managing any medical conditions you may have. ?Do not use any products that contain nicotine or tobacco. These include cigarettes, chewing tobacco, and vaping devices, such as e-cigarettes. If you need help quitting, ask your health care provider. ?Remember "BE FAST" for warning signs of a stroke. Get help right away if you or a loved one has any of these signs. ?This information is not intended to replace advice given to you by your health care provider. Make sure you discuss any questions you have with your  health care provider. ?Document Revised: 10/20/2019 Document Reviewed: 10/20/2019 ?Elsevier Patient Education ? 2022 [place]. ? ? ?

## 2021-07-07 NOTE — [place] Notes (Signed)
? ?[place] ? ? ?Follow-up Note ? ?Referring Provider: [name], NP ?Primary Provider: [name], NP ?Date of Office Visit: 07/07/2021 ? ?Subjective:  ? ?Daniel Herrera (DOB: 1940-06-27) is a 81 y.o. male who returns to the [facility] on 07/07/2021 in re-evaluation of the following: ? ?HPI: Daniel Herrera returns to this clinic in evaluation of type II hereditary angioedema treated with [employer], allergic rhinitis, LPR, sicca syndrome, and interstitial lung disease.  His last visit to this clinic was 16 June 2020. ? ?He continues to do well without any swelling episodes and no need to use Firazyr while using his [employer] every 2 weeks. ? ?He had very little problems with his nose while using Flonase. ? ?His reflux is under very good control with his proton pump inhibitor. ? ?He is following up with his pulmonologist regarding his ILD/IPF. ? ?His sicca syndrome is much better since he has discontinued his antihistamines. ? ?He has a history of superficial skin cancers for which he uses topical 5-fluorouracil just a few times per year which works very well. ? ?He has had a CVA of his cerebellum with vertigo 3 weeks ago with very good compensation.  He is using Plavix and aspirin.  He had fracture of his leg with good healing. ? ?Allergies as of 07/07/2021   ? ?   Reactions  ? Lisinopril Anaphylaxis, Swelling, Other (See Comments)  ? Face, lips, and throat swell  ? Oxycodone Other (See Comments)  ? Hallucination  ? Tape Other (See Comments)  ? PLASTIC TAPE TEARS OFF THE SKIN- Please use an alternative!!  ? Benadryl [diphenhydramine Hcl] Swelling, Other (See Comments)  ? Facial swelling from high dose (tolerates Advil PM as needed)  ? Darvocet [propoxyphene N-acetaminophen] Other (See Comments)  ? Hallucinations  ? Darvon [propoxyphene Hcl] Other (See Comments)  ? Hallucination  ? Tylenol [acetaminophen] Swelling, Other (See Comments)  ? Foot swelling  ? ?   ? ?  ?Medication List  ? ? ?aspirin 81 MG EC tablet ?Take 1 tablet (81 mg total) by mouth daily. ?  ?Azelastine HCl 137 MCG/SPRAY Soln ?Place 1 spray into both nostrils 2 (two) times daily. ?  ?betamethasone valerate ointment 0.1 % ?Commonly known as: VALISONE ?Apply 1 application topically 2 (two) times daily as needed for rash. ?  ?clopidogrel 75 MG tablet ?Commonly known as: PLAVIX ?Take 1 tablet (75 mg total) by mouth daily. ?  ?dextromethorphan-guaiFENesin 30-600 MG 12hr tablet ?Commonly known as: [place] DM ?Take 1 tablet by mouth in the morning and at bedtime. ?  ?EPINEPHrine 0.3 mg/0.3 mL Soaj injection ?Commonly known as: EPI-PEN ?Inject 0.3 mg into the muscle as needed for anaphylaxis. ?  ?ezetimibe 10 MG tablet ?Commonly known as: ZETIA ?Take 1 tablet (10 mg total) by mouth daily. ?  ?fluoruracil 0.5 % cream ?Commonly known as: CARAC ?Apply 1 application topically as needed for rash. For skin cancer flare up ?  ?fluticasone 50 MCG/ACT nasal spray ?Commonly known as: FLONASE ?Place 1 spray into both nostrils 2 (two) times daily. ?  ?glipiZIDE 2.5 MG 24 hr tablet ?Commonly known as: GLUCOTROL XL ?Take 2.5 mg by mouth every morning. ?  ?icatibant 30 MG/3ML injection ?Commonly known as: FIRAZYR ?Inject 3 mLs (30 mg total) into the skin once as needed (for sudden attacks of hereditary angioedema). ?  ?levothyroxine 100 MCG tablet ?Commonly known as: SYNTHROID ?Take 100 mcg by mouth daily before breakfast. ?  ?  Livalo 4 MG Tabs ?Generic drug: Pitavastatin Calcium ?Take 4 mg by mouth every evening. ?  ?magic mouthwash Soln ?Take 15 mLs by mouth 3 (three) times daily as needed for mouth pain. ?  ?metoprolol succinate 25 MG 24 hr tablet ?Commonly known as: TOPROL-XL ?Take 25 mg by mouth daily. ?  ?nabumetone 750 MG tablet ?Commonly known as: RELAFEN ?Take 750 mg by mouth 2 (two) times daily. ?  ?nitroGLYCERIN 0.4 MG SL tablet ?Commonly known as: NITROSTAT ?Place 0.4 mg under the tongue every 5 (five) minutes as  needed for chest pain. ?  ?OVER THE COUNTER MEDICATION ?Take 1 capsule by mouth in the morning and at bedtime. Health Plus/Super Colon Cleanse ?  ?pantoprazole 40 MG tablet ?Commonly known as: PROTONIX ?Take 40 mg by mouth 2 (two) times daily. ?  ?PROBIOTIC DAILY PO ?Take 1 capsule by mouth daily. ?  ?senna 8.6 MG Tabs tablet ?Commonly known as: SENOKOT ?Take 2 tablets (17.2 mg total) by mouth 2 (two) times daily. ?  ?Takhzyro 300 MG/2ML Sosy ?Generic drug: Lanadelumab-flyo ?Inject 300 mg into the skin every 14 (fourteen) days. ?  ?Vitamin B-12 5000 MCG Tbdp ?Take 5,000 mcg by mouth daily. ?  ? ?Past Medical History:  ?Diagnosis Date  ? Abnormal EKG 02/21/2015  ? Acute lower GI bleeding 05/28/2019  ? Anginal pain ([place]) 2016  ? Angio-edema   ? Anxiety   ? Arthritis   ? CAD (coronary artery disease)   ? a. 02/2015: DES to mid-LAD  ? CAD S/P percutaneous coronary angioplasty   ? NSTEMI treated with LAD PCI with DES x 03 Feb 2015 Myoview low risk Nov 2017  ? Cataract   ? CHF (congestive heart failure) ([place]) 2020  ? after surgery only  ? Chronic diarrhea of unknown origin   ? CKD (chronic kidney disease) stage 3, GFR 30-59 ml/min 12/21/2017  ? Colitis 05/23/2015  ? Diverticular hemorrhage   ? Diverticulosis   ? Dyslipidemia, goal LDL below 70 02/21/2015  ? hyperlipidemia   ? Dysrhythmia   ? A-fib  ? Enterotoxigenic Escherichia coli infection 05/24/2015  ? Essential hypertension 02/21/2015  ? Geographic tongue   ? GERD (gastroesophageal reflux disease)   ? GI bleed 12/21/2017  ? Glossitis, benign migratory 12/04/2018  ? Gout   ? Hematochezia 12/21/2017  ? Admitted Sept 2019 with lower GI bleeding felt to be secondary to hemorrhoids  ? Hereditary angioedema type 2 ([place]) 07/28/2017  ? High cholesterol   ? History of kidney stones   ? History of non-ST elevation myocardial infarction (NSTEMI) 02/22/2015  ? Nov 2016  ? History of prolonged Q-T interval on ECG   ? History of prostate cancer   ? Hoarseness 12/04/2018  ? Hx of Clostridium  difficile infection   ? Hx of umbilical hernia repair   ? Hypertension   ? Hypothyroidism   ? ILD (interstitial lung disease) ([place]) 12/17/2013  ? ?related to chicken farm exposure or aspiration pneumonitis from GERD Previously attributed to acute interstitial pneumonitis but based on HRCT this is likely IPF, "probable UIP"  ? Interstitial lung disease ([place])   ? Lower GI bleed 06/03/2019  ? Malignant neoplasm of prostate ([place]) 08/07/2014  ? Myocardial infarction [facility]) 2016  ? NSTEMI (non-ST elevated myocardial infarction) ([place]) 05/21/2018  ? Open metatarsal fracture 09/02/2020  ? PAF (paroxysmal atrial fibrillation) ([place]) 06/13/2018  ? Post CABG- discharged on Amiodarone, he will probably not need this long term  ? Palpitations 03/15/2015  ? palpitations   ?  Peripheral vascular disease ([place])   ? vericose veins  ? Personal history of digestive disease   ? gastric ulcer  ? Pre-diabetes   ? Pre-operative clearance 01/02/2018  ? Prostate cancer ([place])   ? Prostate enlargement   ? S/P CABG x 3 05/28/2018  ?  LIMA-LAD, RIMA-RCA, and Lt radial to OM.  ? Skin cancer   ? basal cell carcinoma  ? STEC (Shiga toxin-producing Escherichia coli) infection 05/24/2015  ? Tachycardia determined by examination of pulse   ? Thyroid disease   ? Type 2 diabetes mellitus with unspecified complications (HCC)   ? Urticaria   ? ? ?Past Surgical History:  ?Procedure Laterality Date  ? APPENDECTOMY    ? BACK SURGERY    ? CARDIAC CATHETERIZATION N/A 02/21/2015  ? Procedure: Left Heart Cath;  Surgeon: Lorretta Harp, MD;  Location: [facility] [place] [facility];  Service: Cardiovascular;  Laterality: N/A;  ? CATARACT EXTRACTION, BILATERAL    ? CORONARY ARTERY BYPASS GRAFT N/A 05/24/2018  ? Procedure: CORONARY ARTERY BYPASS GRAFTING (CABG), ON PUMP, TIMES THREE, USING BILATERAL INTERNAL MAMMARY ARTERY AND HARVESTED LEFT RADIAL ARTERY;  Surgeon: Gaye Pollack, MD;  Location: [facility];  Service: Open Heart Surgery;  Laterality: N/A;  ? CYSTOSCOPY WITH LITHOLAPAXY N/A  07/26/2020  ? Procedure: CYSTOSCOPY WITH LITHOLAPAXY WITH FULGERATION;  Surgeon: Raynelle Bring, MD;  [facility];  Service: Urology;  Laterality: N/A;  ? HOLMIUM LASER APPLICATION N/A 9/41/7408  ? Procedu

## 2021-07-07 NOTE — Patient Instructions (Addendum)
?  1.  Continue Takhzryo every 14 days.  ? ?2.  Continue to Treat and prevent inflammation: ?  ? A.  Flonase - 1 spray each nostril twice a day ? ?3.  Continue to Treat and prevent reflux: ? ? A.  pantoprazole 40 mg twice a day ? ?4.  If needed: ? ? A.  Nasal saline spray ? B.  OTC Mucinex DM - 2 tablets twice a day ? C.  Firazyr injection for swelling reaction ? D.  Carac topical - apply to skin cancer for 1-7 days during 'flare up' ? E.  Systane eye drops ? ?5.  Return to clinic in 6 months or earlier if problem ? ?  ? ?  ? ?  ?

## 2021-07-07 NOTE — Progress Notes (Signed)
?[facility] ? ? ? ?Provider:  Dr Jaynee Eagles ?Requesting Provider: Rosalin Hawking, MD ?Primary Care Provider:  [name], NP ? ?CC:  Stroke, hospital follow up ? ?HPI:  Daniel Herrera is a 81 y.o. male here as requested by Rosalin Hawking, MD for stroke. I spoke to patient in the office and then Spoke to family afterwards. We reviewed his inpatient stay for stroke. History of CAD, hereditary angioedema, HLD, GERD, NSTEMI, CABGx3, hx of GI bleed, pAfibb not on AC due to prior GI bleed, peripheral vascular disease, DM2, BPH presenting with an unsteady gait, mainly clumsiness on the left side, that started on 06/12/2021 around 7pm. MRI shows acute and some subacute Lt PICA infarct.  CTA shows left V2 segment has diminutive flow, likely acute given left PICA infarct ? ?Patient is here alone. He has been in physical therapy for the dizziness which is improving. He feels very good. He has "sick dizzy spells" but he bounces back then it happens again. Feels like he cannot go straight. He was given meclizine by his primary care and helped. The spells last a few minutes with nausea. He is on ASA 325 and Plavix 75 and he is getting bleeding under the skin. Can decrease aspirin to '81mg'$  and continue with plavix. We reviewed his imaging and etiology of stroke as above, discussed plan, Dual antiplatelets for 3 months then plavix alone.  ? ?Reviewed notes, labs and imaging from outside physicians, which showed: ? ?MRI brain 06/14/2021: IMPRESSION: ?Acute and/or subacute infarcts in the left cerebellum, predominantly ?in the left PICA territory. In addition there is loss of the left ?vertebral artery flow void, which may indicate slow flow or ?occlusion and a possible hyperdense left vertebral artery on the ?same-day CT. A CTA head neck is recommended for further evaluation. ? ?CTA H&N: IMPRESSION: ?1. The left vertebral artery is patent proximally, with gradual ?diminution in enhancement throughout the V2 segment, non  enhancement ?of the V3 segment, and complete non enhancement of the V4 segment ?with the exception of the most distal part, which demonstrates ?retrograde enhancement. The nonenhancing portion of V4 includes the ?origin of the left PICA, which demonstrates minimal, likely ?retrograde, enhancement. Given the acute left PICA territory ?infarcts, this non opacification of the left vertebral artery may be ?acute. Consider neuro interventional consult. ?2. No other hemodynamically significant stenosis in the neck. ?3. Severe focal narrowing in the proximal left P2 and moderate ?narrowing in the right P2. ?4. Mild focal narrowing in the left A2 and distal right V4. ?5. Redemonstrated hypodensity in the left cerebellum, which ?correlates with areas of restricted diffusion on the same-day MRI. ? ?Review of Systems: ?Patient complains of symptoms per HPI as well as the following symptoms dizziness. Pertinent negatives and positives per HPI. All others negative. ? ? ?Social History  ? ?Socioeconomic History  ? Marital status: Married  ?  Spouse name: Not on file  ? Number of children: Not on file  ? Years of education: Not on file  ? Highest education level: Not on file  ?Occupational History  ? Occupation: retired  ?Tobacco Use  ? Smoking status: Never  ? Smokeless tobacco: Never  ?Vaping Use  ? Vaping Use: Never used  ?Substance and Sexual Activity  ? Alcohol use: Never  ? Drug use: No  ? Sexual activity: Not on file  ?Other Topics Concern  ? Not on file  ?Social History Narrative  ? Not on file  ? ?Social Determinants of Health  ? ?  Financial Resource Strain: Not on file  ?Food Insecurity: Not on file  ?Transportation Needs: Not on file  ?Physical Activity: Not on file  ?Stress: Not on file  ?Social Connections: Not on file  ?Intimate Partner Violence: Not on file  ? ? ?Family History  ?Problem Relation Age of Onset  ? Cancer Mother   ? Stomach cancer Mother   ? Cancer Father   ?     prostate  ? Cancer Sister   ? Sleep  apnea Neg Hx   ? ? ?Past Medical History:  ?Diagnosis Date  ? Abnormal EKG 02/21/2015  ? Acute lower GI bleeding 05/28/2019  ? Anginal pain ([place]) 2016  ? Angio-edema   ? Anxiety   ? Arthritis   ? CAD (coronary artery disease)   ? a. 02/2015: DES to mid-LAD  ? CAD S/P percutaneous coronary angioplasty   ? NSTEMI treated with LAD PCI with DES x 03 Feb 2015 Myoview low risk Nov 2017  ? Cataract   ? CHF (congestive heart failure) ([place]) 2020  ? after surgery only  ? Chronic diarrhea of unknown origin   ? CKD (chronic kidney disease) stage 3, GFR 30-59 ml/min 12/21/2017  ? Colitis 05/23/2015  ? Diverticular hemorrhage   ? Diverticulosis   ? Dyslipidemia, goal LDL below 70 02/21/2015  ? hyperlipidemia   ? Dysrhythmia   ? A-fib  ? Enterotoxigenic Escherichia coli infection 05/24/2015  ? Essential hypertension 02/21/2015  ? Geographic tongue   ? GERD (gastroesophageal reflux disease)   ? GI bleed 12/21/2017  ? Glossitis, benign migratory 12/04/2018  ? Gout   ? Hematochezia 12/21/2017  ? Admitted Sept 2019 with lower GI bleeding felt to be secondary to hemorrhoids  ? Hereditary angioedema type 2 ([place]) 07/28/2017  ? High cholesterol   ? History of kidney stones   ? History of non-ST elevation myocardial infarction (NSTEMI) 02/22/2015  ? Nov 2016  ? History of prolonged Q-T interval on ECG   ? History of prostate cancer   ? Hoarseness 12/04/2018  ? Hx of Clostridium difficile infection   ? Hx of umbilical hernia repair   ? Hypertension   ? Hypothyroidism   ? ILD (interstitial lung disease) ([place]) 12/17/2013  ? ?related to chicken farm exposure or aspiration pneumonitis from GERD Previously attributed to acute interstitial pneumonitis but based on HRCT this is likely IPF, "probable UIP"  ? Interstitial lung disease ([place])   ? Lower GI bleed 06/03/2019  ? Malignant neoplasm of prostate ([place]) 08/07/2014  ? Myocardial infarction [facility]) 2016  ? NSTEMI (non-ST elevated myocardial infarction) ([place]) 05/21/2018  ? Open metatarsal fracture 09/02/2020  ? PAF  (paroxysmal atrial fibrillation) ([place]) 06/13/2018  ? Post CABG- discharged on Amiodarone, he will probably not need this long term  ? Palpitations 03/15/2015  ? palpitations   ? Peripheral vascular disease ([place])   ? vericose veins  ? Personal history of digestive disease   ? gastric ulcer  ? Pre-diabetes   ? Pre-operative clearance 01/02/2018  ? Prostate cancer ([place])   ? Prostate enlargement   ? S/P CABG x 3 05/28/2018  ?  LIMA-LAD, RIMA-RCA, and Lt radial to OM.  ? Skin cancer   ? basal cell carcinoma  ? STEC (Shiga toxin-producing Escherichia coli) infection 05/24/2015  ? Tachycardia determined by examination of pulse   ? Thyroid disease   ? Type 2 diabetes mellitus with unspecified complications (HCC)   ? Urticaria   ? ? ?Patient Active Problem List  ?  Diagnosis Date Noted  ? Hypothyroidism, unspecified 06/15/2021  ? GERD without esophagitis 06/15/2021  ? Type 2 diabetes mellitus without complications ([place]) 12/75/1700  ? Stroke (cerebrum) ([place]) 06/15/2021  ? Cerebellar stroke ([place]) 06/14/2021  ? Open metatarsal fracture 09/02/2020  ? Dysrhythmia   ? Geographic tongue   ? History of kidney stones   ? Hypothyroidism   ? Interstitial lung disease ([place])   ? Peripheral vascular disease ([place])   ? Pre-diabetes   ? Urticaria   ? Thyroid disease   ? Skin cancer   ? Prostate enlargement   ? Prostate cancer ([place])   ? Personal history of digestive disease   ? Myocardial infarction [facility])   ? Hypertension   ? Hx of umbilical hernia repair   ? Hx of Clostridium difficile infection   ? History of prolonged Q-T interval on ECG   ? High cholesterol   ? Gout   ? GERD (gastroesophageal reflux disease)   ? Arthritis   ? Anxiety   ? Angio-edema   ? Cataract   ? Diverticular hemorrhage   ? Lower GI bleed 06/03/2019  ? Acute lower GI bleeding 05/28/2019  ? Coronary artery disease 05/28/2019  ? Diverticulosis   ? Glossitis, benign migratory 12/04/2018  ? Hoarseness 12/04/2018  ? PAF (paroxysmal atrial fibrillation) ([place]) 06/13/2018  ? S/P  CABG x 3 05/28/2018  ? NSTEMI (non-ST elevated myocardial infarction) ([place]) 05/21/2018  ? Tachycardia determined by examination of pulse   ? CHF (congestive heart failure) ([place]) 2020  ? Pre-operative clearance

## 2021-07-11 ENCOUNTER — Encounter: Payer: Self-pay | Admitting: Allergy and Immunology

## 2021-07-12 DIAGNOSIS — R2689 Other abnormalities of gait and mobility: Secondary | ICD-10-CM | POA: Diagnosis not present

## 2021-07-12 DIAGNOSIS — I509 Heart failure, unspecified: Secondary | ICD-10-CM | POA: Diagnosis not present

## 2021-07-12 DIAGNOSIS — I130 Hypertensive heart and chronic kidney disease with heart failure and stage 1 through stage 4 chronic kidney disease, or unspecified chronic kidney disease: Secondary | ICD-10-CM | POA: Diagnosis not present

## 2021-07-12 DIAGNOSIS — R42 Dizziness and giddiness: Secondary | ICD-10-CM | POA: Diagnosis not present

## 2021-07-12 DIAGNOSIS — I69398 Other sequelae of cerebral infarction: Secondary | ICD-10-CM | POA: Diagnosis not present

## 2021-07-12 DIAGNOSIS — E1122 Type 2 diabetes mellitus with diabetic chronic kidney disease: Secondary | ICD-10-CM | POA: Diagnosis not present

## 2021-07-13 DIAGNOSIS — I509 Heart failure, unspecified: Secondary | ICD-10-CM | POA: Diagnosis not present

## 2021-07-13 DIAGNOSIS — I69398 Other sequelae of cerebral infarction: Secondary | ICD-10-CM | POA: Diagnosis not present

## 2021-07-13 DIAGNOSIS — R42 Dizziness and giddiness: Secondary | ICD-10-CM | POA: Diagnosis not present

## 2021-07-13 DIAGNOSIS — E1122 Type 2 diabetes mellitus with diabetic chronic kidney disease: Secondary | ICD-10-CM | POA: Diagnosis not present

## 2021-07-13 DIAGNOSIS — I130 Hypertensive heart and chronic kidney disease with heart failure and stage 1 through stage 4 chronic kidney disease, or unspecified chronic kidney disease: Secondary | ICD-10-CM | POA: Diagnosis not present

## 2021-07-13 DIAGNOSIS — R2689 Other abnormalities of gait and mobility: Secondary | ICD-10-CM | POA: Diagnosis not present

## 2021-07-16 ENCOUNTER — Other Ambulatory Visit: Payer: Self-pay

## 2021-07-16 ENCOUNTER — Emergency Department (HOSPITAL_COMMUNITY)
Admission: EM | Admit: 2021-07-16 | Discharge: 2021-07-16 | Disposition: A | Discharge status: Home / Self Care | Payer: Medicare Other | Attending: Emergency Medicine | Admitting: Emergency Medicine

## 2021-07-16 ENCOUNTER — Emergency Department (HOSPITAL_COMMUNITY): Payer: Medicare Other

## 2021-07-16 ENCOUNTER — Encounter (HOSPITAL_COMMUNITY): Payer: Self-pay | Admitting: Emergency Medicine

## 2021-07-16 DIAGNOSIS — R0602 Shortness of breath: Secondary | ICD-10-CM | POA: Diagnosis not present

## 2021-07-16 DIAGNOSIS — Z951 Presence of aortocoronary bypass graft: Secondary | ICD-10-CM | POA: Diagnosis not present

## 2021-07-16 DIAGNOSIS — N183 Chronic kidney disease, stage 3 unspecified: Secondary | ICD-10-CM | POA: Insufficient documentation

## 2021-07-16 DIAGNOSIS — R519 Headache, unspecified: Secondary | ICD-10-CM | POA: Diagnosis not present

## 2021-07-16 DIAGNOSIS — I2510 Atherosclerotic heart disease of native coronary artery without angina pectoris: Secondary | ICD-10-CM | POA: Diagnosis not present

## 2021-07-16 DIAGNOSIS — Z7901 Long term (current) use of anticoagulants: Secondary | ICD-10-CM | POA: Insufficient documentation

## 2021-07-16 DIAGNOSIS — I129 Hypertensive chronic kidney disease with stage 1 through stage 4 chronic kidney disease, or unspecified chronic kidney disease: Secondary | ICD-10-CM | POA: Diagnosis not present

## 2021-07-16 DIAGNOSIS — Z7982 Long term (current) use of aspirin: Secondary | ICD-10-CM | POA: Diagnosis not present

## 2021-07-16 DIAGNOSIS — R002 Palpitations: Secondary | ICD-10-CM | POA: Diagnosis not present

## 2021-07-16 DIAGNOSIS — R531 Weakness: Secondary | ICD-10-CM | POA: Diagnosis not present

## 2021-07-16 DIAGNOSIS — E860 Dehydration: Secondary | ICD-10-CM | POA: Insufficient documentation

## 2021-07-16 DIAGNOSIS — Z8546 Personal history of malignant neoplasm of prostate: Secondary | ICD-10-CM | POA: Diagnosis not present

## 2021-07-16 DIAGNOSIS — I4891 Unspecified atrial fibrillation: Secondary | ICD-10-CM | POA: Diagnosis not present

## 2021-07-16 LAB — URINALYSIS, ROUTINE W REFLEX MICROSCOPIC
Bilirubin Urine: NEGATIVE
Glucose, UA: NEGATIVE mg/dL
Hgb urine dipstick: NEGATIVE
Ketones, ur: NEGATIVE mg/dL
Leukocytes,Ua: NEGATIVE
Nitrite: NEGATIVE
Protein, ur: NEGATIVE mg/dL
Specific Gravity, Urine: 1.008 (ref 1.005–1.030)
pH: 5 (ref 5.0–8.0)

## 2021-07-16 LAB — BASIC METABOLIC PANEL
Anion gap: 11 (ref 5–15)
BUN: 21 mg/dL (ref 8–23)
CO2: 21 mmol/L — ABNORMAL LOW (ref 22–32)
Calcium: 9.1 mg/dL (ref 8.9–10.3)
Chloride: 103 mmol/L (ref 98–111)
Creatinine, Ser: 1.54 mg/dL — ABNORMAL HIGH (ref 0.61–1.24)
GFR, Estimated: 45 mL/min — ABNORMAL LOW (ref >60)
Glucose, Bld: 179 mg/dL — ABNORMAL HIGH (ref 70–99)
Potassium: 4.1 mmol/L (ref 3.5–5.1)
Sodium: 135 mmol/L (ref 135–145)

## 2021-07-16 LAB — CBC
HCT: 45 % (ref 39.0–52.0)
Hemoglobin: 15.7 g/dL (ref 13.0–17.0)
MCH: 31.7 pg (ref 26.0–34.0)
MCHC: 34.9 g/dL (ref 30.0–36.0)
MCV: 90.9 fL (ref 80.0–100.0)
Platelets: 148 10*3/uL — ABNORMAL LOW (ref 150–400)
RBC: 4.95 MIL/uL (ref 4.22–5.81)
RDW: 12.2 % (ref 11.5–15.5)
WBC: 4.9 10*3/uL (ref 4.0–10.5)
nRBC: 0 % (ref 0.0–0.2)

## 2021-07-16 LAB — MAGNESIUM: Magnesium: 2.1 mg/dL (ref 1.7–2.4)

## 2021-07-16 LAB — TROPONIN I (HIGH SENSITIVITY): Troponin I (High Sensitivity): 9 ng/L (ref <18)

## 2021-07-16 LAB — TSH: TSH: 1.436 u[IU]/mL (ref 0.350–4.500)

## 2021-07-16 MED ORDER — SODIUM CHLORIDE 0.9 % IV BOLUS
500.0000 mL | Freq: Once | INTRAVENOUS | Status: AC
  Administered 2021-07-16: 500 mL via INTRAVENOUS

## 2021-07-16 NOTE — Discharge Instructions (Signed)
Your history, exam, work-up today are suggestive of a degree of dehydration with your dry mouth and other exam findings.  The CT of your head did not show any new acute findings to suggest hemorrhagic conversion or evidence of new stroke.  I suspect your palpitations were you having intermittent arrhythmias however you have proven stability for nearly 4 hours here without any further symptoms or other complaints.  Please rest and stay hydrated and call your primary doctor to discuss titration of your medications however we do feel you are safe for discharge home at this time.  If any symptoms are to change or worsen acutely, please return to the nearest emergency department for reevaluation. ?

## 2021-07-16 NOTE — ED Provider Triage Note (Signed)
Emergency Medicine Provider Triage Evaluation Note ? ?Daniel Herrera , a 81 y.o. male  was evaluated in triage.  Pt complains of heart racing, reported heart rate 150s this morning.  History of A-fib.  Recently discharged for CVA a month ago.  History of diabetes and hypertension.  Is anticoagulated but does not remember the medication. ? ?Review of Systems  ?Positive: Lightheadedness and weakness. ?Negative: Chest pain or syncope ? ?Physical Exam  ?BP (!) 142/91 (BP Location: Left Arm)   Pulse 84   Temp 98.4 ?F (36.9 ?C) (Oral)   Resp 14   SpO2 96%  ?Gen:   Awake, no distress   ?Resp:  Normal effort  ?MSK:   Moves extremities without difficulty  ?Other:  Regular rate and rhythm, EKG also appears to be in sinus ? ?Medical Decision Making  ?Medically screening exam initiated at 11:06 AM.  Appropriate orders placed.  STEPHANIE MCGLONE was informed that the remainder of the evaluation will be completed by another provider, this initial triage assessment does not replace that evaluation, and the importance of remaining in the ED until their evaluation is complete. ? ?Med list reveals Plavix for anticoagulation. ?  ?Rhae Hammock, PA-C ?07/16/21 1108 ? ?

## 2021-07-16 NOTE — ED Triage Notes (Signed)
Pt states he believes he has been in Afib for 3 days.  Reports HR up to 159.  C/o generalized weakness and headache. ?

## 2021-07-16 NOTE — ED Provider Notes (Signed)
?[facility] ?Provider Note ? ? ?CSN: [id number] ?Arrival date & time: 07/16/21  1056 ? ?  ? ?History ? ?Chief Complaint  ?Patient presents with  ? Atrial Fibrillation  ? ? ?Daniel Herrera is a 81 y.o. male. ? ?The history is provided by the patient, medical records and a relative. No language interpreter was used.  ?Palpitations ?Palpitations quality:  Fast ?Onset quality:  Sudden ?Duration:  1 hour ?Timing:  Intermittent ?Progression:  Waxing and waning ?Chronicity:  Recurrent ?Relieved by:  Nothing ?Worsened by:  Nothing ?Ineffective treatments:  None tried ?Associated symptoms: malaise/fatigue and shortness of breath (resolved)   ?Associated symptoms: no back pain, no chest pain, no cough, no diaphoresis, no dizziness, no leg pain, no lower extremity edema, no nausea, no near-syncope, no numbness, no syncope, no vomiting and no weakness   ? ?  ? ?Home Medications ?Prior to Admission medications   ?Medication Sig Start Date End Date Taking? Authorizing Provider  ?aspirin 81 MG EC tablet Take 1 tablet (81 mg total) by mouth daily. 07/07/21   Melvenia Beam, MD  ?Azelastine HCl 137 MCG/SPRAY SOLN Place 1 spray into both nostrils 2 (two) times daily. 02/05/21   [provider]  ?betamethasone valerate ointment (VALISONE) 0.1 % Apply 1 application topically 2 (two) times daily as needed for rash. 01/18/21   [provider]  ?clopidogrel (PLAVIX) 75 MG tablet Take 1 tablet (75 mg total) by mouth daily. 06/17/21 06/12/22  Barb Merino, MD  ?Cyanocobalamin (VITAMIN B-12) 5000 MCG TBDP Take 5,000 mcg by mouth daily.     [provider]  ?dextromethorphan-guaiFENesin (MUCINEX DM) 30-600 MG 12hr tablet Take 1 tablet by mouth in the morning and at bedtime.    [provider]  ?EPINEPHrine 0.3 mg/0.3 mL IJ SOAJ injection Inject 0.3 mg into the muscle as needed for anaphylaxis.    [provider]  ?ezetimibe (ZETIA) 10 MG tablet Take 1 tablet  (10 mg total) by mouth daily. 06/17/21 09/15/21  Barb Merino, MD  ?fluoruracil [facility]) 0.5 % cream Apply 1 application topically as needed for rash. For skin cancer flare up    [provider]  ?fluticasone (FLONASE) 50 MCG/ACT nasal spray Place 1 spray into both nostrils 2 (two) times daily.    [provider]  ?glipiZIDE (GLUCOTROL XL) 2.5 MG 24 hr tablet Take 2.5 mg by mouth every morning. 10/21/20   [provider]  ?icatibant (FIRAZYR) 30 MG/3ML injection Inject 3 mLs (30 mg total) into the skin once as needed (for sudden attacks of hereditary angioedema). 03/07/21   Kozlow, Donnamarie Poag, MD  ?levothyroxine (SYNTHROID) 100 MCG tablet Take 100 mcg by mouth daily before breakfast.    [provider]  ?magic mouthwash SOLN Take 15 mLs by mouth 3 (three) times daily as needed for mouth pain.    [provider]  ?metoprolol succinate (TOPROL-XL) 25 MG 24 hr tablet Take 25 mg by mouth daily. 10/24/20   [provider]  ?nabumetone (RELAFEN) 750 MG tablet Take 750 mg by mouth 2 (two) times daily. 08/09/19   [provider]  ?nitroGLYCERIN (NITROSTAT) 0.4 MG SL tablet Place 0.4 mg under the tongue every 5 (five) minutes as needed for chest pain.    [provider]  ?OVER THE COUNTER MEDICATION Take 1 capsule by mouth in the morning and at bedtime. Health Plus/Super Colon Cleanse    [provider]  ?pantoprazole (PROTONIX) 40 MG tablet Take 40 mg  by mouth 2 (two) times daily.    [provider]  ?Pitavastatin Calcium (LIVALO) 4 MG TABS Take 4 mg by mouth every evening.    [provider]  ?Probiotic Product (PROBIOTIC DAILY PO) Take 1 capsule by mouth daily.    [provider]  ?senna (SENOKOT) 8.6 MG TABS tablet Take 2 tablets (17.2 mg total) by mouth 2 (two) times daily. 09/03/20   Corky Sing, PA-C  ?TAKHZYRO 300 MG/2ML SOSY Inject 300 mg into the skin every 14 (fourteen) days. 10/21/20   [provider]  ?    ? ?Allergies    ?Lisinopril, Oxycodone, Tape, Benadryl [diphenhydramine hcl], Darvocet [propoxyphene n-acetaminophen], Darvon [propoxyphene hcl], and Tylenol [acetaminophen]   ? ?Review of Systems   ?Review of Systems  ?Constitutional:  Positive for fatigue (with the palpitations) and malaise/fatigue. Negative for chills and diaphoresis.  ?HENT:  Negative for congestion.   ?Eyes:  Positive for visual disturbance (intermittent). Negative for pain.  ?Respiratory:  Positive for shortness of breath (resolved). Negative for cough, chest tightness and wheezing.   ?Cardiovascular:  Positive for palpitations. Negative for chest pain, leg swelling, syncope and near-syncope.  ?Gastrointestinal:  Negative for constipation, diarrhea, nausea and vomiting.  ?Genitourinary:  Negative for dysuria, flank pain and frequency.  ?Musculoskeletal:  Negative for back pain, neck pain and neck stiffness.  ?Skin:  Negative for rash and wound.  ?Neurological:  Positive for light-headedness and headaches. Negative for dizziness, syncope, speech difficulty, weakness and numbness.  ?Psychiatric/Behavioral:  Negative for agitation and confusion.   ?All other systems reviewed and are negative. ? ?Physical Exam ?Updated Vital Signs ?BP (!) 154/94   Pulse 74   Temp 98.4 ?F (36.9 ?C) (Oral)   Resp 15   SpO2 98%  ?Physical Exam ?Vitals and nursing note reviewed.  ?Constitutional:   ?   General: He is not in acute distress. ?   Appearance: He is well-developed. He is not ill-appearing, toxic-appearing or diaphoretic.  ?HENT:  ?   Head: Normocephalic and atraumatic.  ?   Nose: No congestion or rhinorrhea.  ?   Mouth/Throat:  ?   Mouth: Mucous membranes are dry.  ?   Pharynx: No oropharyngeal exudate or posterior oropharyngeal erythema.  ?Eyes:  ?   Extraocular Movements: Extraocular movements intact.  ?   Conjunctiva/sclera: Conjunctivae normal.  ?   Pupils: Pupils are equal, round, and reactive to light.  ?Neck:  ?   Vascular: No carotid bruit.   ?Cardiovascular:  ?   Rate and Rhythm: Normal rate and regular rhythm.  ?   Pulses: Normal pulses.  ?   Heart sounds: No murmur heard. ?Pulmonary:  ?   Effort: Pulmonary effort is normal. No respiratory distress.  ?   Breath sounds: Normal breath sounds. No wheezing, rhonchi or rales.  ?Chest:  ?   Chest wall: No tenderness.  ?Abdominal:  ?   General: Abdomen is flat.  ?   Palpations: Abdomen is soft.  ?   Tenderness: There is no abdominal tenderness. There is no right CVA tenderness, left CVA tenderness, guarding or rebound.  ?Musculoskeletal:     ?   General: No swelling or tenderness.  ?   Cervical back: Neck supple. No tenderness.  ?   Right lower leg: No edema.  ?   Left lower leg: No edema.  ?Skin: ?   General: Skin is warm and dry.  ?   Capillary Refill: Capillary refill takes less than 2 seconds.  ?  Findings: No erythema.  ?Neurological:  ?   General: No focal deficit present.  ?   Mental Status: He is alert and oriented to person, place, and time. Mental status is at baseline.  ?   Sensory: No sensory deficit.  ?   Motor: No weakness.  ?   Coordination: Coordination normal.  ?Psychiatric:     ?   Mood and Affect: Mood normal.  ? ? ?ED Results / Procedures / Treatments   ?Labs ?(all labs ordered are listed, but only abnormal results are displayed) ?Labs Reviewed  ?BASIC METABOLIC PANEL - Abnormal; Notable for the following components:  ?    Result Value  ? CO2 21 (*)   ? Glucose, Bld 179 (*)   ? Creatinine, Ser 1.54 (*)   ? GFR, Estimated 45 (*)   ? All other components within normal limits  ?CBC - Abnormal; Notable for the following components:  ? Platelets 148 (*)   ? All other components within normal limits  ?URINE CULTURE  ?MAGNESIUM  ?TSH  ?URINALYSIS, ROUTINE W REFLEX MICROSCOPIC  ?TROPONIN I (HIGH SENSITIVITY)  ?TROPONIN I (HIGH SENSITIVITY)  ? ? ?EKG ?EKG Interpretation ? ?Date/Time:  Saturday July 16 2021 10:49:31 EDT ?Ventricular Rate:  92 ?PR Interval:  226 ?QRS Duration: 96 ?QT  Interval:  356 ?QTC Calculation: 440 ?R Axis:   -11 ?Text Interpretation: Sinus rhythm with 1st degree A-V block Moderate voltage criteria for LVH, may be normal variant ( R in aVL , [employer] product ) Possible Anterio

## 2021-07-17 LAB — URINE CULTURE: Culture: NO GROWTH

## 2021-07-19 DIAGNOSIS — R42 Dizziness and giddiness: Secondary | ICD-10-CM | POA: Diagnosis not present

## 2021-07-19 DIAGNOSIS — R2689 Other abnormalities of gait and mobility: Secondary | ICD-10-CM | POA: Diagnosis not present

## 2021-07-19 DIAGNOSIS — I509 Heart failure, unspecified: Secondary | ICD-10-CM | POA: Diagnosis not present

## 2021-07-19 DIAGNOSIS — E1122 Type 2 diabetes mellitus with diabetic chronic kidney disease: Secondary | ICD-10-CM | POA: Diagnosis not present

## 2021-07-19 DIAGNOSIS — I69398 Other sequelae of cerebral infarction: Secondary | ICD-10-CM | POA: Diagnosis not present

## 2021-07-19 DIAGNOSIS — I130 Hypertensive heart and chronic kidney disease with heart failure and stage 1 through stage 4 chronic kidney disease, or unspecified chronic kidney disease: Secondary | ICD-10-CM | POA: Diagnosis not present

## 2021-07-21 DIAGNOSIS — R42 Dizziness and giddiness: Secondary | ICD-10-CM | POA: Diagnosis not present

## 2021-07-21 DIAGNOSIS — I130 Hypertensive heart and chronic kidney disease with heart failure and stage 1 through stage 4 chronic kidney disease, or unspecified chronic kidney disease: Secondary | ICD-10-CM | POA: Diagnosis not present

## 2021-07-21 DIAGNOSIS — R2689 Other abnormalities of gait and mobility: Secondary | ICD-10-CM | POA: Diagnosis not present

## 2021-07-21 DIAGNOSIS — I509 Heart failure, unspecified: Secondary | ICD-10-CM | POA: Diagnosis not present

## 2021-07-21 DIAGNOSIS — E1122 Type 2 diabetes mellitus with diabetic chronic kidney disease: Secondary | ICD-10-CM | POA: Diagnosis not present

## 2021-07-21 DIAGNOSIS — I69398 Other sequelae of cerebral infarction: Secondary | ICD-10-CM | POA: Diagnosis not present

## 2021-07-24 DIAGNOSIS — I69398 Other sequelae of cerebral infarction: Secondary | ICD-10-CM | POA: Diagnosis not present

## 2021-07-24 DIAGNOSIS — K219 Gastro-esophageal reflux disease without esophagitis: Secondary | ICD-10-CM | POA: Diagnosis not present

## 2021-07-24 DIAGNOSIS — K5790 Diverticulosis of intestine, part unspecified, without perforation or abscess without bleeding: Secondary | ICD-10-CM | POA: Diagnosis not present

## 2021-07-24 DIAGNOSIS — J849 Interstitial pulmonary disease, unspecified: Secondary | ICD-10-CM | POA: Diagnosis not present

## 2021-07-24 DIAGNOSIS — Z7982 Long term (current) use of aspirin: Secondary | ICD-10-CM | POA: Diagnosis not present

## 2021-07-24 DIAGNOSIS — Z79899 Other long term (current) drug therapy: Secondary | ICD-10-CM | POA: Diagnosis not present

## 2021-07-24 DIAGNOSIS — I25119 Atherosclerotic heart disease of native coronary artery with unspecified angina pectoris: Secondary | ICD-10-CM | POA: Diagnosis not present

## 2021-07-24 DIAGNOSIS — N1831 Chronic kidney disease, stage 3a: Secondary | ICD-10-CM | POA: Diagnosis not present

## 2021-07-24 DIAGNOSIS — J0100 Acute maxillary sinusitis, unspecified: Secondary | ICD-10-CM | POA: Diagnosis not present

## 2021-07-24 DIAGNOSIS — I130 Hypertensive heart and chronic kidney disease with heart failure and stage 1 through stage 4 chronic kidney disease, or unspecified chronic kidney disease: Secondary | ICD-10-CM | POA: Diagnosis not present

## 2021-07-24 DIAGNOSIS — E039 Hypothyroidism, unspecified: Secondary | ICD-10-CM | POA: Diagnosis not present

## 2021-07-24 DIAGNOSIS — M109 Gout, unspecified: Secondary | ICD-10-CM | POA: Diagnosis not present

## 2021-07-24 DIAGNOSIS — Z7902 Long term (current) use of antithrombotics/antiplatelets: Secondary | ICD-10-CM | POA: Diagnosis not present

## 2021-07-24 DIAGNOSIS — D841 Defects in the complement system: Secondary | ICD-10-CM | POA: Diagnosis not present

## 2021-07-24 DIAGNOSIS — I252 Old myocardial infarction: Secondary | ICD-10-CM | POA: Diagnosis not present

## 2021-07-24 DIAGNOSIS — R2689 Other abnormalities of gait and mobility: Secondary | ICD-10-CM | POA: Diagnosis not present

## 2021-07-24 DIAGNOSIS — F419 Anxiety disorder, unspecified: Secondary | ICD-10-CM | POA: Diagnosis not present

## 2021-07-24 DIAGNOSIS — E1122 Type 2 diabetes mellitus with diabetic chronic kidney disease: Secondary | ICD-10-CM | POA: Diagnosis not present

## 2021-07-24 DIAGNOSIS — I8390 Asymptomatic varicose veins of unspecified lower extremity: Secondary | ICD-10-CM | POA: Diagnosis not present

## 2021-07-24 DIAGNOSIS — E7800 Pure hypercholesterolemia, unspecified: Secondary | ICD-10-CM | POA: Diagnosis not present

## 2021-07-24 DIAGNOSIS — I509 Heart failure, unspecified: Secondary | ICD-10-CM | POA: Diagnosis not present

## 2021-07-24 DIAGNOSIS — J320 Chronic maxillary sinusitis: Secondary | ICD-10-CM | POA: Diagnosis not present

## 2021-07-24 DIAGNOSIS — E1151 Type 2 diabetes mellitus with diabetic peripheral angiopathy without gangrene: Secondary | ICD-10-CM | POA: Diagnosis not present

## 2021-07-24 DIAGNOSIS — I480 Paroxysmal atrial fibrillation: Secondary | ICD-10-CM | POA: Diagnosis not present

## 2021-07-24 DIAGNOSIS — R42 Dizziness and giddiness: Secondary | ICD-10-CM | POA: Diagnosis not present

## 2021-07-25 DIAGNOSIS — I509 Heart failure, unspecified: Secondary | ICD-10-CM | POA: Diagnosis not present

## 2021-07-25 DIAGNOSIS — I130 Hypertensive heart and chronic kidney disease with heart failure and stage 1 through stage 4 chronic kidney disease, or unspecified chronic kidney disease: Secondary | ICD-10-CM | POA: Diagnosis not present

## 2021-07-25 DIAGNOSIS — R2689 Other abnormalities of gait and mobility: Secondary | ICD-10-CM | POA: Diagnosis not present

## 2021-07-25 DIAGNOSIS — R42 Dizziness and giddiness: Secondary | ICD-10-CM | POA: Diagnosis not present

## 2021-07-25 DIAGNOSIS — E1122 Type 2 diabetes mellitus with diabetic chronic kidney disease: Secondary | ICD-10-CM | POA: Diagnosis not present

## 2021-07-25 DIAGNOSIS — I69398 Other sequelae of cerebral infarction: Secondary | ICD-10-CM | POA: Diagnosis not present

## 2021-07-28 DIAGNOSIS — I509 Heart failure, unspecified: Secondary | ICD-10-CM | POA: Diagnosis not present

## 2021-07-28 DIAGNOSIS — E1122 Type 2 diabetes mellitus with diabetic chronic kidney disease: Secondary | ICD-10-CM | POA: Diagnosis not present

## 2021-07-28 DIAGNOSIS — R2689 Other abnormalities of gait and mobility: Secondary | ICD-10-CM | POA: Diagnosis not present

## 2021-07-28 DIAGNOSIS — I130 Hypertensive heart and chronic kidney disease with heart failure and stage 1 through stage 4 chronic kidney disease, or unspecified chronic kidney disease: Secondary | ICD-10-CM | POA: Diagnosis not present

## 2021-07-28 DIAGNOSIS — I69398 Other sequelae of cerebral infarction: Secondary | ICD-10-CM | POA: Diagnosis not present

## 2021-07-28 DIAGNOSIS — R42 Dizziness and giddiness: Secondary | ICD-10-CM | POA: Diagnosis not present

## 2021-07-29 ENCOUNTER — Other Ambulatory Visit: Payer: Self-pay | Admitting: Allergy and Immunology

## 2021-08-04 ENCOUNTER — Ambulatory Visit (INDEPENDENT_AMBULATORY_CARE_PROVIDER_SITE_OTHER): Payer: Medicare Other | Admitting: Cardiology

## 2021-08-04 ENCOUNTER — Ambulatory Visit (INDEPENDENT_AMBULATORY_CARE_PROVIDER_SITE_OTHER): Payer: Medicare Other

## 2021-08-04 VITALS — BP 142/72 | HR 83 | Ht 73.0 in | Wt 194.8 lb

## 2021-08-04 DIAGNOSIS — I252 Old myocardial infarction: Secondary | ICD-10-CM | POA: Diagnosis not present

## 2021-08-04 DIAGNOSIS — E785 Hyperlipidemia, unspecified: Secondary | ICD-10-CM | POA: Diagnosis not present

## 2021-08-04 DIAGNOSIS — Z9861 Coronary angioplasty status: Secondary | ICD-10-CM

## 2021-08-04 DIAGNOSIS — Z951 Presence of aortocoronary bypass graft: Secondary | ICD-10-CM

## 2021-08-04 DIAGNOSIS — E118 Type 2 diabetes mellitus with unspecified complications: Secondary | ICD-10-CM

## 2021-08-04 DIAGNOSIS — R002 Palpitations: Secondary | ICD-10-CM

## 2021-08-04 DIAGNOSIS — I10 Essential (primary) hypertension: Secondary | ICD-10-CM

## 2021-08-04 DIAGNOSIS — I639 Cerebral infarction, unspecified: Secondary | ICD-10-CM

## 2021-08-04 DIAGNOSIS — I2510 Atherosclerotic heart disease of native coronary artery without angina pectoris: Secondary | ICD-10-CM

## 2021-08-04 NOTE — Progress Notes (Signed)
?Cardiology Office Note:   ? ?Date:  08/04/2021  ? ?ID:  [name], DOB 10-22-1940, MRN [id number] ? ?PCP:  [name], NP  ?Cardiologist:  Jenean Lindau, MD  ? ?Referring MD: [name], NP  ? ? ?ASSESSMENT:   ? ?1. CAD S/P percutaneous coronary angioplasty   ?2. Essential hypertension   ?3. Dyslipidemia, goal LDL below 70   ?4. Cerebellar stroke ([place])   ?5. History of non-ST elevation myocardial infarction (NSTEMI)   ?6. Palpitations   ?7. Type 2 diabetes mellitus with unspecified complications ([place])   ?8. S/P CABG x 3   ? ?PLAN:   ? ?In order of problems listed above: ? ?Coronary artery disease: Secondary prevention stressed with the patient.  Importance of compliance with diet medication stressed and he vocalized understanding. ?Palpitations: Unclear etiology.  I reassured him about my findings today.  We will do a active ZIO transmission for 2 weeks to see if there are any issues such as atrial fibrillation which may need a different level of therapy with anticoagulation.  This is especially in view of recent stroke.  His palpitations are concerning to him.  We will do our best to evaluate with and treat him based on the findings. ?Essential hypertension: Blood pressure stable and diet was emphasized.  Lifestyle modification urged. ?Diabetes mellitus and mixed dyslipidemia: On statin therapy and followed by primary care. ?Patient will be seen in follow-up appointment in 6 months or earlier if the patient has any concerns ? ? ? ?Medication Adjustments/Labs and Tests Ordered: ?Current medicines are reviewed at length with the patient today.  Concerns regarding medicines are outlined above.  ?No orders of the defined types were placed in this encounter. ? ?No orders of the defined types were placed in this encounter. ? ? ? ?No chief complaint on file. ?  ? ?History of Present Illness:   ? ?Daniel Herrera is a 81 y.o. male.  Patient has past medical history of coronary artery disease post CABG surgery,  essential hypertension and dyslipidemia.  He recently had stroke and is recovering from it.  Now he gives history suggestive of palpitations and therefore he called our office today and we decided to bring him in and see him.  I reviewed recent hospital admissions.  His CBC and TSH is fine.  At the time of my evaluation is alert awake oriented and in no distress.  He can ? ?Past Medical History:  ?Diagnosis Date  ? Abnormal EKG 02/21/2015  ? Acute lower GI bleeding 05/28/2019  ? Anginal pain ([place]) 2016  ? Angio-edema   ? Anxiety   ? Arthritis   ? CAD S/P percutaneous coronary angioplasty   ? NSTEMI treated with LAD PCI with DES x 03 Feb 2015 Myoview low risk Nov 2017  ? Cataract   ? CHF (congestive heart failure) ([place]) 2020  ? after surgery only  ? Chronic diarrhea of unknown origin   ? CKD (chronic kidney disease) stage 3, GFR 30-59 ml/min 12/21/2017  ? Colitis 05/23/2015  ? Diverticular hemorrhage   ? Diverticulosis   ? Dyslipidemia, goal LDL below 70 02/21/2015  ? hyperlipidemia   ? Dysrhythmia   ? A-fib  ? Enterotoxigenic Escherichia coli infection 05/24/2015  ? Essential hypertension 02/21/2015  ? Geographic tongue   ? GERD (gastroesophageal reflux disease)   ? GI bleed 12/21/2017  ? Glossitis, benign migratory 12/04/2018  ? Gout   ? Hematochezia 12/21/2017  ? Admitted Sept 2019 with  lower GI bleeding felt to be secondary to hemorrhoids  ? Hereditary angioedema type 2 ([place]) 07/28/2017  ? High cholesterol   ? History of kidney stones   ? History of non-ST elevation myocardial infarction (NSTEMI) 02/22/2015  ? Nov 2016  ? History of prolonged Q-T interval on ECG   ? History of prostate cancer   ? Hoarseness 12/04/2018  ? Hx of Clostridium difficile infection   ? Hx of umbilical hernia repair   ? Hypertension   ? Hypothyroidism   ? ILD (interstitial lung disease) ([place]) 12/17/2013  ? ?related to chicken farm exposure or aspiration pneumonitis from GERD Previously attributed to acute interstitial pneumonitis but  based on HRCT this is likely IPF, "probable UIP"  ? Interstitial lung disease ([place])   ? Lower GI bleed 06/03/2019  ? Malignant neoplasm of prostate ([place]) 08/07/2014  ? Myocardial infarction [facility]) 2016  ? NSTEMI (non-ST elevated myocardial infarction) ([place]) 05/21/2018  ? Open metatarsal fracture 09/02/2020  ? PAF (paroxysmal atrial fibrillation) ([place]) 06/13/2018  ? Post CABG- discharged on Amiodarone, he will probably not need this long term  ? Palpitations 03/15/2015  ? palpitations   ? Peripheral vascular disease ([place])   ? vericose veins  ? Personal history of digestive disease   ? gastric ulcer  ? Pre-diabetes   ? Pre-operative clearance 01/02/2018  ? Prostate cancer ([place])   ? Prostate enlargement   ? S/P CABG x 3 05/28/2018  ?  LIMA-LAD, RIMA-RCA, and Lt radial to OM.  ? Skin cancer   ? basal cell carcinoma  ? STEC (Shiga toxin-producing Escherichia coli) infection 05/24/2015  ? Tachycardia determined by examination of pulse   ? Thyroid disease   ? Type 2 diabetes mellitus with unspecified complications ([facility])   ? Urticaria   ? ? ?Past Surgical History:  ?Procedure Laterality Date  ? APPENDECTOMY    ? BACK SURGERY    ? CARDIAC CATHETERIZATION N/A 02/21/2015  ? Procedure: Left Heart Cath;  Surgeon: Lorretta Harp, MD;  Location: [place] [facility];  Service: Cardiovascular;  Laterality: N/A;  ? CATARACT EXTRACTION, BILATERAL    ? CORONARY ARTERY BYPASS GRAFT N/A 05/24/2018  ? Procedure: CORONARY ARTERY BYPASS GRAFTING (CABG), ON PUMP, TIMES THREE, USING BILATERAL INTERNAL MAMMARY ARTERY AND HARVESTED LEFT RADIAL ARTERY;  Surgeon: Gaye Pollack, MD;  Location: [facility];  Service: Open Heart Surgery;  Laterality: N/A;  ? CYSTOSCOPY WITH LITHOLAPAXY N/A 07/26/2020  ? Procedure: CYSTOSCOPY WITH LITHOLAPAXY WITH FULGERATION;  Surgeon: Raynelle Bring, MD;  Location: [facility];  Service: Urology;  Laterality: N/A;  ? HOLMIUM LASER APPLICATION N/A 4/70/9628  ? Procedure: HOLMIUM LASER APPLICATION;  Surgeon: Raynelle Bring, MD;   Location: [facility];  Service: Urology;  Laterality: N/A;  ? I & D EXTREMITY Right 09/02/2020  ? Procedure: IRRIGATION AND DEBRIDEMENT RIGHT 2ND AND 3RD METATARSAL;  Surgeon: Wylene Simmer, MD;  Location: [place] [place];  Service: Orthopedics;  Laterality: Right;  ? LEFT HEART CATH AND CORONARY ANGIOGRAPHY N/A 05/22/2018  ? Procedure: LEFT HEART CATH AND CORONARY ANGIOGRAPHY;  Surgeon: Jettie Booze, MD;  Location: [place] [facility];  Service: Cardiovascular;  Laterality: N/A;  ? NASAL SINUS SURGERY    ? ORIF TOE FRACTURE Right 09/02/2020  ? Procedure: OPEN REDUCTION INTERNAL FIXATION (ORIF) OF RIGHT OPEN 2ND AND 3RD METATARSAL (TOE) OPEN FRACTURE;  Surgeon: Wylene Simmer, MD;  Location: [place];  Service: Orthopedics;  Laterality: Right;  ? PROSTATE BIOPSY    ? RADIAL ARTERY HARVEST  Left 05/24/2018  ? Procedure: RADIAL ARTERY HARVEST;  Surgeon: Gaye Pollack, MD;  Location: [place];  Service: Open Heart Surgery;  Laterality: Left;  ? right shoulder rotator cuff repair    ? TEE WITHOUT CARDIOVERSION N/A 05/24/2018  ? Procedure: TRANSESOPHAGEAL ECHOCARDIOGRAM (TEE);  Surgeon: Gaye Pollack, MD;  Location: [place];  Service: Open Heart Surgery;  Laterality: N/A;  ? ? ?Current Medications: ?Current Meds  ?Medication Sig  ? aspirin 81 MG EC tablet Take 1 tablet (81 mg total) by mouth daily.  ? Azelastine HCl 137 MCG/SPRAY SOLN Place 1 spray into both nostrils 2 (two) times daily.  ? betamethasone valerate ointment (VALISONE) 0.1 % Apply 1 application topically 2 (two) times daily as needed for rash.  ? clopidogrel (PLAVIX) 75 MG tablet Take 1 tablet (75 mg total) by mouth daily.  ? Cyanocobalamin (VITAMIN B-12) 5000 MCG TBDP Take 5,000 mcg by mouth daily.   ? dextromethorphan-guaiFENesin (MUCINEX DM) 30-600 MG 12hr tablet Take 1 tablet by mouth in the morning and at bedtime.  ? EPINEPHrine 0.3 mg/0.3 mL IJ SOAJ injection Inject 0.3 mg into the muscle as needed for anaphylaxis.  ? ezetimibe (ZETIA) 10 MG tablet Take 1 tablet (10 mg  total) by mouth daily.  ? fluoruracil (CARAC) 0.5 % cream Apply 1 application topically as needed for rash. For skin cancer flare up  ? fluticasone (FLONASE) 50 MCG/ACT nasal spray Place 1 spray into both

## 2021-08-04 NOTE — Patient Instructions (Signed)
Medication Instructions:  ?Your physician recommends that you continue on your current medications as directed. Please refer to the Current Medication list given to you today.  ?*If you need a refill on your cardiac medications before your next appointment, please call your pharmacy* ? ? ?Lab Work: ?None ordered ?If you have labs (blood work) drawn today and your tests are completely normal, you will receive your results only by: ?MyChart Message (if you have MyChart) OR ?A paper copy in the mail ?If you have any lab test that is abnormal or we need to change your treatment, we will call you to review the results. ? ? ?Testing/Procedures: ? ?WHY IS MY DOCTOR PRESCRIBING ZIO? ?The Zio system is proven and trusted by physicians to detect and diagnose irregular heart rhythms -- and has been prescribed to hundreds of thousands of patients. ? ?The FDA has cleared the Zio system to monitor for many different kinds of irregular heart rhythms. In a study, physicians were able to reach a diagnosis 90% of the time with the Zio system1. ? ?You can wear the Zio monitor -- a small, discreet, comfortable patch -- during your normal day-to-day activity, including while you sleep, shower, and exercise, while it records every single heartbeat for analysis. ? ?1Barrett, P., et al. Comparison of 24 Hour Holter Monitoring Versus 14 Day Novel Adhesive Patch Electrocardiographic Monitoring. [place], 2014. ? ?ZIO VS. HOLTER MONITORING ?The Zio monitor can be comfortably worn for up to 14 days. Holter monitors can be worn for 24 to 48 hours, limiting the time to record any irregular heart rhythms you may have. Zio is able to capture data for the 51% of patients who have their first symptom-triggered arrhythmia after 48 hours.1 ? ?LIVE WITHOUT RESTRICTIONS ?The Zio ambulatory cardiac monitor is a small, unobtrusive, and water-resistant patch--you might even forget you?re wearing it. The Zio monitor records and stores  every beat of your heart, whether you're sleeping, working out, or showering. ?Wear for 14 days, remove 08/18/21. ? ? ?Follow-Up: ?At [facility], you and your health needs are our priority.  As part of our continuing mission to provide you with exceptional heart care, we have created designated Provider Care Teams.  These Care Teams include your primary Cardiologist (physician) and Advanced Practice Providers (APPs -  Physician Assistants and Nurse Practitioners) who all work together to provide you with the care you need, when you need it. ? ?We recommend signing up for the patient portal called "MyChart".  Sign up information is provided on this After Visit Summary.  MyChart is used to connect with patients for Virtual Visits (Telemedicine).  Patients are able to view lab/test results, encounter notes, upcoming appointments, etc.  Non-urgent messages can be sent to your provider as well.   ?To learn more about what you can do with MyChart, go to [email or web address].   ? ?Your next appointment:   ?Keep scheduled appointment ? ?The format for your next appointment:   ?In Person ? ?Provider:   ?Jyl Heinz, MD ? ? ?Other Instructions ?NA ? ?

## 2021-08-05 DIAGNOSIS — R002 Palpitations: Secondary | ICD-10-CM | POA: Diagnosis not present

## 2021-09-02 ENCOUNTER — Telehealth: Payer: Self-pay

## 2021-09-02 DIAGNOSIS — I471 Supraventricular tachycardia: Secondary | ICD-10-CM

## 2021-09-02 DIAGNOSIS — R002 Palpitations: Secondary | ICD-10-CM

## 2021-09-02 NOTE — Telephone Encounter (Signed)
-----   Message from Jenean Lindau, MD sent at 08/31/2021  4:57 PM EDT ----- Significantly abnormal and I would like him to be referred to Dr. Curt Bears.  Copy primary he can see him when he comes to #for the next Jenean Lindau, MD 08/31/2021 4:56 PM

## 2021-09-02 NOTE — Telephone Encounter (Signed)
Results reviewed with pt as per Dr. Revankar's note.  Pt verbalized understanding and had no additional questions. Routed to PCP.  

## 2021-09-08 ENCOUNTER — Telehealth: Payer: Self-pay | Admitting: *Deleted

## 2021-09-08 NOTE — Telephone Encounter (Signed)
-----   Message from Jiles Prows, MD sent at 09/08/2021  1:41 PM EDT ----- Please let [name] know that I did try to find the results of his cardiac monitor study but they are not available at this point.  I do not think that the injection he is using for his hereditary angioedema is responsible for this issue.  Whenever anyone has problems with arrhythmia they should definitely not be on any decongestants and minimize caffeine consumption as much as possible.

## 2021-09-08 NOTE — Telephone Encounter (Signed)
Spoke with Daniel Herrera and let him know all of this.

## 2021-09-09 ENCOUNTER — Other Ambulatory Visit: Payer: Self-pay | Admitting: Allergy and Immunology

## 2021-09-13 DIAGNOSIS — D841 Defects in the complement system: Secondary | ICD-10-CM | POA: Diagnosis not present

## 2021-09-13 DIAGNOSIS — E785 Hyperlipidemia, unspecified: Secondary | ICD-10-CM | POA: Diagnosis not present

## 2021-09-13 DIAGNOSIS — E039 Hypothyroidism, unspecified: Secondary | ICD-10-CM | POA: Diagnosis not present

## 2021-09-13 DIAGNOSIS — E538 Deficiency of other specified B group vitamins: Secondary | ICD-10-CM | POA: Diagnosis not present

## 2021-09-13 DIAGNOSIS — I10 Essential (primary) hypertension: Secondary | ICD-10-CM | POA: Diagnosis not present

## 2021-09-13 DIAGNOSIS — E559 Vitamin D deficiency, unspecified: Secondary | ICD-10-CM | POA: Diagnosis not present

## 2021-09-13 DIAGNOSIS — J849 Interstitial pulmonary disease, unspecified: Secondary | ICD-10-CM | POA: Diagnosis not present

## 2021-09-13 DIAGNOSIS — E1169 Type 2 diabetes mellitus with other specified complication: Secondary | ICD-10-CM | POA: Diagnosis not present

## 2021-09-13 DIAGNOSIS — I2510 Atherosclerotic heart disease of native coronary artery without angina pectoris: Secondary | ICD-10-CM | POA: Diagnosis not present

## 2021-09-13 DIAGNOSIS — C61 Malignant neoplasm of prostate: Secondary | ICD-10-CM | POA: Diagnosis not present

## 2021-09-13 DIAGNOSIS — D649 Anemia, unspecified: Secondary | ICD-10-CM | POA: Diagnosis not present

## 2021-09-13 DIAGNOSIS — N1831 Chronic kidney disease, stage 3a: Secondary | ICD-10-CM | POA: Diagnosis not present

## 2021-09-30 DIAGNOSIS — R351 Nocturia: Secondary | ICD-10-CM | POA: Diagnosis not present

## 2021-09-30 DIAGNOSIS — C61 Malignant neoplasm of prostate: Secondary | ICD-10-CM | POA: Diagnosis not present

## 2021-09-30 DIAGNOSIS — N401 Enlarged prostate with lower urinary tract symptoms: Secondary | ICD-10-CM | POA: Diagnosis not present

## 2021-10-03 ENCOUNTER — Ambulatory Visit (INDEPENDENT_AMBULATORY_CARE_PROVIDER_SITE_OTHER): Payer: Medicare Other | Admitting: Cardiology

## 2021-10-03 ENCOUNTER — Encounter: Payer: Self-pay | Admitting: Cardiology

## 2021-10-03 VITALS — BP 148/70 | HR 65 | Ht 73.0 in | Wt 196.4 lb

## 2021-10-03 DIAGNOSIS — I639 Cerebral infarction, unspecified: Secondary | ICD-10-CM

## 2021-10-03 DIAGNOSIS — I471 Supraventricular tachycardia: Secondary | ICD-10-CM

## 2021-10-03 MED ORDER — METOPROLOL SUCCINATE ER 50 MG PO TB24: 50.0000 mg | ORAL_TABLET | Freq: Every day | ORAL | 2 refills | Status: DC

## 2021-10-03 NOTE — Patient Instructions (Addendum)
Medication Instructions:  Your physician has recommended you make the following change in your medication:  INCREASE Metoprolol Succinate (Toprol) to 50 mg once daily  *If you need a refill on your cardiac medications before your next appointment, please call your pharmacy*   Lab Work: None ordered   Testing/Procedures: Your physician has recommended that you have a loop recorder inserted. The office will schedule you to come to our [facility] office [facility]) to have this implanted in the office.  Follow-Up: At [facility], you and your health needs are our priority.  As part of our continuing mission to provide you with exceptional heart care, we have created designated Provider Care Teams.  These Care Teams include your primary Cardiologist (physician) and Advanced Practice Providers (APPs -  Physician Assistants and Nurse Practitioners) who all work together to provide you with the care you need, when you need it.   Your next appointment:   To be   determined    The format for your next appointment:   In Person  Provider:   Allegra Lai, MD    Thank you for choosing [facility]!!   Trinidad Curet, RN [phone]  Other Instructions    Important Information About Sugar        Implantable Loop Recorder Placement  An implantable loop recorder is a small electronic device that is placed under the skin of your chest. The device records the electrical activity of your heart over a long period of time. Your health care provider can download these recordings to monitor your heart. You may need an implantable loop recorder if you have periods of abnormal heart activity (arrhythmias) or unexplained fainting (syncope). The recorder can be left in place for 1 year or longer. Tell a health care provider about: Any allergies you have. All medicines you are taking, including vitamins, herbs, eye drops, creams, and over-the-counter medicines. Any problems you or  family members have had with anesthetic medicines. Any bleeding problems you have. Any surgeries you have had. Any medical conditions you have. Whether you are pregnant or may be pregnant. What are the risks? Generally, this is a safe procedure. However, problems may occur, including: Infection. Bleeding. Allergic reactions to anesthetic medicines. Damage to nerves or blood vessels. Failure of the device to work. This could require another surgery to replace it. What happens before the procedure?  You may have a physical exam, blood tests, and imaging tests of your heart, such as a chest X-ray. Follow instructions from your health care provider about eating or drinking restrictions. Ask your health care provider about: Changing or stopping your regular medicines. This is especially important if you are taking diabetes medicines or blood thinners. Taking medicines such as aspirin and ibuprofen. These medicines can thin your blood. Do not take these medicines unless your health care provider tells you to take them. Taking over-the-counter medicines, vitamins, herbs, and supplements. Ask your health care provider how your surgical site will be marked or identified. Ask your health care provider what steps will be taken to help prevent infection. These may include: Removing hair at the surgery site. Washing skin with a germ-killing soap. Plan to have someone take you home from the hospital or clinic. Plan to have a responsible adult care for you for at least 24 hours after you leave the hospital or clinic. This is important. Do not use any products that contain nicotine or tobacco, such as cigarettes and e-cigarettes. If you need help quitting, ask your  health care provider. What happens during the procedure? An IV will be inserted into one of your veins. You may be given one or more of the following: A medicine to help you relax (sedative). A medicine to numb the area (local  anesthetic). A small incision will be made on the left side of your upper chest. A pocket will be created under your skin. The device will be placed in the pocket. The incision will be closed with stitches (sutures) or adhesive strips. A bandage (dressing) will be placed over the incision. The procedure may vary among health care providers and hospitals. What happens after the procedure? Your blood pressure, heart rate, breathing rate, and blood oxygen level will be monitored until you leave the hospital or clinic. You may be able to go home on the day of your surgery. Before you go home: Your health care provider will program your recorder. You will learn how to trigger your device with a handheld activator. You will learn how to send recordings to your health care provider. You will get an ID card for your device, and you will be told when to use it. Do not drive for 24 hours if you were given a sedative during your procedure. Summary An implantable loop recorder is a small electronic device that is placed under the skin of your chest to monitor your heart over a long period of time. The recorder can be left in place for 1 year or longer. Plan to have someone take you home from the hospital or clinic. This information is not intended to replace advice given to you by your health care provider. Make sure you discuss any questions you have with your health care provider. Document Revised: 07/20/2020 Document Reviewed: 07/20/2020 Elsevier Patient Education  [place].

## 2021-10-03 NOTE — Progress Notes (Signed)
Electrophysiology Office Note   Date:  10/03/2021   ID:  Daniel Herrera, Daniel Herrera Jan 10, 1941, MRN [id number]  PCP:  [name], NP  Cardiologist:  Revankar Primary Electrophysiologist:  Benjamyn Hestand Meredith Leeds, MD    Chief Complaint: SVT   History of Present Illness: Daniel Herrera is a 81 y.o. male who is being seen today for the evaluation of SVT at the request of Revankar, Reita Cliche, MD. Presenting today for electrophysiology evaluation.  He has a history significant for coronary artery disease status post CABG, hypertension, hyperlipidemia.  He had a stroke.  He has palpitations and wore a cardiac monitor that showed episodes of SVT.  Today, he denies symptoms of palpitations, chest pain, shortness of breath, orthopnea, PND, lower extremity edema, claudication, dizziness, presyncope, syncope, bleeding, or neurologic sequela. The patient is tolerating medications without difficulties.  Today he is feeling well.  He has no chest pain or shortness of breath.  He is recovered from his stroke.  He has noted no further episodes of SVT.  He states that he was feeling quite poorly on his Plavix and that he.  Since he stopped them, he stopped having palpitations.  We also discussed ILR implant to monitor for atrial fibrillation and stroke.  He has agreed to this.   Past Medical History:  Diagnosis Date   Abnormal EKG 02/21/2015   Acute lower GI bleeding 05/28/2019   Anginal pain ([place]) 2016   Angio-edema    Anxiety    Arthritis    CAD S/P percutaneous coronary angioplasty    NSTEMI treated with LAD PCI with DES x 03 Feb 2015 Myoview low risk Nov 2017   Cataract    CHF (congestive heart failure) ([place]) 2020   after surgery only   Chronic diarrhea of unknown origin    CKD (chronic kidney disease) stage 3, GFR 30-59 ml/min 12/21/2017   Colitis 05/23/2015   Diverticular hemorrhage    Diverticulosis    Dyslipidemia, goal LDL below 70 02/21/2015   hyperlipidemia    Dysrhythmia    A-fib    Enterotoxigenic Escherichia coli infection 05/24/2015   Essential hypertension 02/21/2015   Geographic tongue    GERD (gastroesophageal reflux disease)    GI bleed 12/21/2017   Glossitis, benign migratory 12/04/2018   Gout    Hematochezia 12/21/2017   Admitted Sept 2019 with lower GI bleeding felt to be secondary to hemorrhoids   Hereditary angioedema type 2 ([place]) 07/28/2017   High cholesterol    History of kidney stones    History of non-ST elevation myocardial infarction (NSTEMI) 02/22/2015   Nov 2016   History of prolonged Q-T interval on ECG    History of prostate cancer    Hoarseness 12/04/2018   Hx of Clostridium difficile infection    Hx of umbilical hernia repair    Hypertension    Hypothyroidism    ILD (interstitial lung disease) ([place]) 12/17/2013   ?related to chicken farm exposure or aspiration pneumonitis from GERD Previously attributed to acute interstitial pneumonitis but based on HRCT this is likely IPF, "probable UIP"   Interstitial lung disease ([place])    Lower GI bleed 06/03/2019   Malignant neoplasm of prostate ([place]) 08/07/2014   Myocardial infarction [facility]) 2016   NSTEMI (non-ST elevated myocardial infarction) ([place]) 05/21/2018   Open metatarsal fracture 09/02/2020   PAF (paroxysmal atrial fibrillation) ([place]) 06/13/2018   Post CABG- discharged on Amiodarone, he Deven Furia probably not need this long term   Palpitations 03/15/2015  palpitations    Peripheral vascular disease ([facility])    vericose veins   Personal history of digestive disease    gastric ulcer   Pre-diabetes    Pre-operative clearance 01/02/2018   Prostate cancer ([place])    Prostate enlargement    S/P CABG x 3 05/28/2018    LIMA-LAD, RIMA-RCA, and Lt radial to OM.   Skin cancer    basal cell carcinoma   STEC (Shiga toxin-producing Escherichia coli) infection 05/24/2015   Tachycardia determined by examination of pulse    Thyroid disease    Type 2 diabetes mellitus with unspecified complications ([place])     Urticaria    Past Surgical History:  Procedure Laterality Date   APPENDECTOMY     BACK SURGERY     CARDIAC CATHETERIZATION N/A 02/21/2015   Procedure: Left Heart Cath;  Surgeon: Lorretta Harp, MD;  Location: [place] [facility];  Service: Cardiovascular;  Laterality: N/A;   CATARACT EXTRACTION, BILATERAL     CORONARY ARTERY BYPASS GRAFT N/A 05/24/2018   Procedure: CORONARY ARTERY BYPASS GRAFTING (CABG), ON PUMP, TIMES THREE, USING BILATERAL INTERNAL MAMMARY ARTERY AND HARVESTED LEFT RADIAL ARTERY;  Surgeon: Gaye Pollack, MD;  Location: [place];  Service: Open Heart Surgery;  Laterality: N/A;   CYSTOSCOPY WITH LITHOLAPAXY N/A 07/26/2020   Procedure: CYSTOSCOPY WITH LITHOLAPAXY WITH FULGERATION;  Surgeon: Raynelle Bring, MD;  Location: [facility];  Service: Urology;  Laterality: N/A;   HOLMIUM LASER APPLICATION N/A 1/61/0960   Procedure: HOLMIUM LASER APPLICATION;  Surgeon: Raynelle Bring, MD;  Location: [facility];  Service: Urology;  Laterality: N/A;   I & D EXTREMITY Right 09/02/2020   Procedure: IRRIGATION AND DEBRIDEMENT RIGHT 2ND AND 3RD METATARSAL;  Surgeon: Wylene Simmer, MD;  Location: [place];  Service: Orthopedics;  Laterality: Right;   LEFT HEART CATH AND CORONARY ANGIOGRAPHY N/A 05/22/2018   Procedure: LEFT HEART CATH AND CORONARY ANGIOGRAPHY;  Surgeon: Jettie Booze, MD;  Location: [place] [facility];  Service: Cardiovascular;  Laterality: N/A;   NASAL SINUS SURGERY     ORIF TOE FRACTURE Right 09/02/2020   Procedure: OPEN REDUCTION INTERNAL FIXATION (ORIF) OF RIGHT OPEN 2ND AND 3RD METATARSAL (TOE) OPEN FRACTURE;  Surgeon: Wylene Simmer, MD;  Location: [place];  Service: Orthopedics;  Laterality: Right;   PROSTATE BIOPSY     RADIAL ARTERY HARVEST Left 05/24/2018   Procedure: RADIAL ARTERY HARVEST;  Surgeon: Gaye Pollack, MD;  Location: [place];  Service: Open Heart Surgery;  Laterality: Left;   right shoulder rotator cuff repair     TEE WITHOUT CARDIOVERSION N/A 05/24/2018   Procedure:  TRANSESOPHAGEAL ECHOCARDIOGRAM (TEE);  Surgeon: Gaye Pollack, MD;  Location: [place];  Service: Open Heart Surgery;  Laterality: N/A;     Current Outpatient Medications  Medication Sig Dispense Refill   aspirin 81 MG EC tablet Take 1 tablet (81 mg total) by mouth daily. 30 tablet 11   Azelastine HCl 137 MCG/SPRAY SOLN Place 1 spray into both nostrils 2 (two) times daily.     betamethasone valerate ointment (VALISONE) 0.1 % Apply 1 application topically 2 (two) times daily as needed for rash.     Cyanocobalamin (VITAMIN B-12) 5000 MCG TBDP Take 5,000 mcg by mouth daily.      dextromethorphan-guaiFENesin (MUCINEX DM) 30-600 MG 12hr tablet Take 1 tablet by mouth in the morning and at bedtime.     EPINEPHRINE 0.3 mg/0.3 mL IJ SOAJ injection INJECT AS DIRECTED FOR LIFE-THREATHENING ALLERGIC REACTION 2 each 1  fluoruracil (CARAC) 0.5 % cream Apply 1 application topically as needed for rash. For skin cancer flare up     fluticasone (FLONASE) 50 MCG/ACT nasal spray Place 1 spray into both nostrils 2 (two) times daily.     glipiZIDE (GLUCOTROL XL) 2.5 MG 24 hr tablet Take 2.5 mg by mouth every morning.     icatibant (FIRAZYR) 30 MG/3ML injection Inject 3 mLs (30 mg total) into the skin once as needed (for sudden attacks of hereditary angioedema). 9 mL 5   levothyroxine (SYNTHROID) 100 MCG tablet Take 100 mcg by mouth daily before breakfast.     magic mouthwash SOLN Take 15 mLs by mouth 3 (three) times daily as needed for mouth pain.     metoprolol succinate (TOPROL-XL) 50 MG 24 hr tablet Take 1 tablet (50 mg total) by mouth daily. Take with or immediately following a meal. 90 tablet 2   nabumetone (RELAFEN) 750 MG tablet Take 750 mg by mouth 2 (two) times daily.     nitroGLYCERIN (NITROSTAT) 0.4 MG SL tablet Place 0.4 mg under the tongue every 5 (five) minutes as needed for chest pain.     OVER THE COUNTER MEDICATION Take 1 capsule by mouth in the morning and at bedtime. Health Plus/Super Colon Cleanse      pantoprazole (PROTONIX) 40 MG tablet Take 40 mg by mouth 2 (two) times daily.     Pitavastatin Calcium (LIVALO) 4 MG TABS Take 4 mg by mouth every evening.     Probiotic Product (PROBIOTIC DAILY PO) Take 1 capsule by mouth daily.     TAKHZYRO 300 MG/2ML SOSY INJECT '300MG'$  SUBCUTANEOUSLY EVERY 2 WEEKS 4 mL 11   No current facility-administered medications for this visit.    Allergies:   Lisinopril, Oxycodone, Tape, Benadryl [diphenhydramine hcl], Darvocet [propoxyphene n-acetaminophen], Darvon [propoxyphene hcl], and Tylenol [acetaminophen]   Social History:  The patient  reports that he has never smoked. He has never used smokeless tobacco. He reports that he does not drink alcohol and does not use drugs.   Family History:  The patient's family history includes Cancer in his father, mother, and sister; Stomach cancer in his mother.    ROS:  Please see the history of present illness.   Otherwise, review of systems is positive for none.   All other systems are reviewed and negative.    PHYSICAL EXAM: VS:  BP (!) 148/70   Pulse 65   Ht '6\' 1"'$  (1.854 m)   Wt 196 lb 6.4 oz (89.1 kg)   SpO2 98%   BMI 25.91 kg/m  , BMI Body mass index is 25.91 kg/m. GEN: Well nourished, well developed, in no acute distress  HEENT: normal  Neck: no JVD, carotid bruits, or masses Cardiac: RRR; no murmurs, rubs, or gallops,no edema  Respiratory:  clear to auscultation bilaterally, normal work of breathing GI: soft, nontender, nondistended, + BS MS: no deformity or atrophy  Skin: warm and dry\ Neuro:  Strength and sensation are intact Psych: euthymic mood, full affect  EKG:  EKG is ordered today. Personal review of the ekg ordered shows this rhythm, first-degree AV block, PACs  Recent Labs: 06/14/2021: ALT 22 07/16/2021: BUN 21; Creatinine, Ser 1.54; Hemoglobin 15.7; Magnesium 2.1; Platelets 148; Potassium 4.1; Sodium 135; TSH 1.436    Lipid Panel     Component Value Date/Time   CHOL 156  06/15/2021 0308   CHOL 183 05/23/2019 0826   TRIG 180 (H) 06/15/2021 0308   HDL 30 (L) 06/15/2021 0308  HDL 44 05/23/2019 0826   CHOLHDL 5.2 06/15/2021 0308   VLDL 36 06/15/2021 0308   LDLCALC 90 06/15/2021 0308   LDLCALC 108 (H) 05/23/2019 0826     Wt Readings from Last 3 Encounters:  10/03/21 196 lb 6.4 oz (89.1 kg)  08/04/21 194 lb 12.8 oz (88.4 kg)  07/07/21 194 lb 7.1 oz (88.2 kg)      Other studies Reviewed: Additional studies/ records that were reviewed today include: TTE 06/15/21  Review of the above records today demonstrates:   1. Left ventricular ejection fraction, by estimation, is 55 to 60%. The  left ventricle has normal function. The left ventricle has no regional  wall motion abnormalities. There is mild left ventricular hypertrophy.  Left ventricular diastolic parameters  are indeterminate.   2. Right ventricular systolic function is mildly reduced. The right  ventricular size is normal. Tricuspid regurgitation signal is inadequate  for assessing PA pressure.   3. The mitral valve is normal in structure. Trivial mitral valve  regurgitation. No evidence of mitral stenosis.   4. The aortic valve is tricuspid. Aortic valve regurgitation is not  visualized. No aortic stenosis is present.   Monitor 08/31/21 personally reviewed Patient had a min HR of 32 bpm, max HR of 176 bpm, and avg HR of 75 bpm.   Predominant underlying rhythm was Sinus Rhythm.    99 Supraventricular Tachycardia runs occurred, the run with the fastest interval lasting 9 mins 17 secs with a max rate of 176 bpm, the longest lasting 29 mins 39 secs with an avg rate of 138 bpm. Second Degree AV Block-Mobitz I (Wenckebach) was present. Supraventricular Tachycardia was detected within +/- 45 seconds of symptomatic patient event(s). Isolated SVEs were rare (<1.0%),  SVE Couplets were rare (<1.0%), and SVE Triplets were rare (<1.0%).    Isolated VEs were rare (<1.0%), VE Couplets were rare (<1.0%),  and no VE Triplets were present. Ventricular Bigeminy and Trigeminy were present.  ASSESSMENT AND PLAN:  1.  SVT: Has narrow complex tachycardia associated with symptoms of palpitations.  He has since stopped his Plavix and Zetia.  Palpitations have improved.  No changes.  2.  Coronary artery disease: Status post CABG x3.  Has also had PCI.  No current chest pain.  Plan per primary cardiology.  3.  Cryptogenic stroke: Occurred in the cerebellum.  Wore a cardiac monitor that showed episodes of SVT but no atrial fibrillation.  He did have postoperative atrial fibrillation after CABG.  Despite this we have not documented any further episodes.  Due to that, we Bao Bazen plan for Linq monitor implant.  Risk and benefits were discussed risk of bleeding and infection.  He understands these risks and is agreed to the procedure.  4.  Hypertension: Elevated today.  Plan to increase metoprolol.  5.  Hyperlipidemia: Continue statin per primary cardiology  Case discussed with primary cardiology  Current medicines are reviewed at length with the patient today.   The patient does not have concerns regarding his medicines.  The following changes were made today: Increase metoprolol  Labs/ tests ordered today include:  Orders Placed This Encounter  Procedures   EKG 12-Lead     Disposition:   FU with Kalik Hoare pending link monitor results  Signed, Danyale Ridinger Meredith Leeds, MD  10/03/2021 12:18 PM     [facility] [address] [place] [address] [phone] (office) [phone] (fax)

## 2021-10-06 DIAGNOSIS — Z23 Encounter for immunization: Secondary | ICD-10-CM | POA: Diagnosis not present

## 2021-10-27 DIAGNOSIS — H524 Presbyopia: Secondary | ICD-10-CM | POA: Diagnosis not present

## 2021-10-27 DIAGNOSIS — E119 Type 2 diabetes mellitus without complications: Secondary | ICD-10-CM | POA: Diagnosis not present

## 2021-11-08 ENCOUNTER — Telehealth: Payer: Self-pay | Admitting: Cardiology

## 2021-11-08 ENCOUNTER — Ambulatory Visit: Payer: Medicare Other | Admitting: Cardiology

## 2021-11-08 NOTE — Telephone Encounter (Signed)
Spoke with the patient who states that when he increased his metoprolol to 50 mg daily that his blood pressure and heart rate "went crazy". He states that he took the increased dose for two weeks and then went back down to 25 mg per day. Patient reports that for the last several weeks his blood pressure has been around 130s/80s. The highest it has been is 160/84. The highest his heart rate has gotten is 66. He states that he has been feeling good. He is also unsure about getting a loop recorder. He states that he is not sure that it would be worth it at his age and also unsure of whether he could afford it.

## 2021-11-08 NOTE — Telephone Encounter (Signed)
Pt c/o medication issue:  1. Name of Medication: metoprolol succinate (TOPROL-XL) 50 MG 24 hr tablet  2. How are you currently taking this medication (dosage and times per day)? 25 mg once a day   3. Are you having a reaction (difficulty breathing--STAT)? No   4. What is your medication issue? Pt called stating that he was prescribed the 25 originally and Dr. Curt Bears increased the dosage to '50mg'$ . He states he is now taking 25 mg again and his bp has been fine. He also wanted to inform Dr. Curt Bears, that he is a little uneasy about the loop recorder.  Highest Bp in 3 weeks -160/84  hr66

## 2021-11-08 NOTE — Telephone Encounter (Signed)
Spoke with the patient and advised on Dr. Macky Lower recommendation. He states that he wishes to cancel his appointment for now to give him some more time to think about it. He will call back if he decides to reschedule.

## 2021-11-14 ENCOUNTER — Ambulatory Visit: Payer: Medicare Other | Admitting: Cardiology

## 2021-12-01 ENCOUNTER — Encounter: Payer: Self-pay | Admitting: Pulmonary Disease

## 2021-12-01 ENCOUNTER — Ambulatory Visit (INDEPENDENT_AMBULATORY_CARE_PROVIDER_SITE_OTHER): Payer: Medicare Other | Admitting: Pulmonary Disease

## 2021-12-01 DIAGNOSIS — I639 Cerebral infarction, unspecified: Secondary | ICD-10-CM

## 2021-12-01 DIAGNOSIS — J849 Interstitial pulmonary disease, unspecified: Secondary | ICD-10-CM

## 2021-12-01 NOTE — Addendum Note (Signed)
Addended by: Vanessa Barbara on: 12/01/2021 03:11 PM   Modules accepted: Orders

## 2021-12-01 NOTE — Assessment & Plan Note (Signed)
He does not desaturate on walking.  Appears to be clinically stable. We will obtain high-resolution CT chest and hold off on PFTs. As previously discussed, he did not like the side effect profile of antifibrotic's and would prefer to avoid

## 2021-12-01 NOTE — Progress Notes (Signed)
   Subjective:    Patient ID: Daniel Herrera, male    DOB: 20-Nov-1940, 81 y.o.   MRN: [id number]  HPI  81 yo never smoker for FU of ILD/probable UIP   PMH- prostate CA, C diff colitis  SVTs that stop with vagal maneuvers,   Recurrent angioedema - on lanadelumab which is a monoclonal antibody to plasma kallilrein/bradykinin CABG 06/2018 chronic hoarseness for many years, ENT evaluation  negative  He presented in 2015 with dyspnea for a few months, PFTs were normal except for mildly decreased DLCO .  CT chest at [facility] showed mild interstitial prominence. This was attributed to chicken farm exposure or GERD related pneumonitis but has shown gradual progression to "probable UIP" pattern.  Chief Complaint  Patient presents with   Follow-up   Annual follow-up visit. He had MI in March and cryptogenic stroke in the cerebellum I have reviewed cardiology and EP consultation for SVT.  Cardiac monitor showed episodes of SVT.  Loop recorder was recommended , but he decided against it. He stopped taking Plavix and Zetia due to easy bruising Breathing is stable, he stays active  He has 3 sisters all older to him with dementia   Significant tests/ events reviewed  HRCT 09/2020 >> 'probable UIP' worse compared to 2015   HRCT chest 05/2019 -probable UIP, no definite honeycombing   CT chest without contrast 11/2013  showed interstitial prominence particularly apical with mild bronchiectasis, small patchy infiltrate was noted in the right lower lobe with a tiny effusion. ESR ,CCP neg   PFTs 09/2019 FVC stable at 94%, DLCO stable at 16.6/62% compared to 15.6 and 58% in 2020   PFTs 02/2014 - nml FEV1/ FVC & ratio, TLC 81%, DLCO 65%  Review of Systems neg for any significant sore throat, dysphagia, itching, sneezing, nasal congestion or excess/ purulent secretions, fever, chills, sweats, unintended wt loss, pleuritic or exertional cp, hempoptysis, orthopnea pnd or change in chronic leg  swelling. Also denies presyncope, palpitations, heartburn, abdominal pain, nausea, vomiting, diarrhea or change in bowel or urinary habits, dysuria,hematuria, rash, arthralgias, visual complaints, headache, numbness weakness or ataxia.     Objective:   Physical Exam  Gen. Pleasant, well-nourished, in no distress ENT - no thrush, no pallor/icterus,no post nasal drip Neck: No JVD, no thyromegaly, no carotid bruits Lungs: no use of accessory muscles, no dullness to percussion, bibasal dry rales no rhonchi  Cardiovascular: Rhythm regular, heart sounds  normal, no murmurs or gallops, no peripheral edema Musculoskeletal: No deformities, no cyanosis or clubbing        Assessment & Plan:

## 2021-12-01 NOTE — Patient Instructions (Addendum)
X schedule high-resolution CT chest---  in [place]  X ambulatory saturation

## 2021-12-09 DIAGNOSIS — Z23 Encounter for immunization: Secondary | ICD-10-CM | POA: Diagnosis not present

## 2021-12-13 ENCOUNTER — Encounter: Payer: Self-pay | Admitting: Pulmonary Disease

## 2021-12-13 DIAGNOSIS — I700 Atherosclerosis of aorta: Secondary | ICD-10-CM | POA: Diagnosis not present

## 2021-12-13 DIAGNOSIS — J849 Interstitial pulmonary disease, unspecified: Secondary | ICD-10-CM | POA: Diagnosis not present

## 2021-12-13 DIAGNOSIS — J479 Bronchiectasis, uncomplicated: Secondary | ICD-10-CM | POA: Diagnosis not present

## 2021-12-13 DIAGNOSIS — K449 Diaphragmatic hernia without obstruction or gangrene: Secondary | ICD-10-CM | POA: Diagnosis not present

## 2021-12-13 DIAGNOSIS — J8410 Pulmonary fibrosis, unspecified: Secondary | ICD-10-CM | POA: Diagnosis not present

## 2021-12-15 ENCOUNTER — Telehealth: Payer: Self-pay | Admitting: Pulmonary Disease

## 2021-12-19 NOTE — Telephone Encounter (Signed)
Called [facility] to ask for them to fax the order they are needing from Dr Elsworth Soho. [facility] fax number.

## 2021-12-21 NOTE — Telephone Encounter (Signed)
Pt had CT performed and fax of results has been received.

## 2021-12-30 DIAGNOSIS — Z139 Encounter for screening, unspecified: Secondary | ICD-10-CM | POA: Diagnosis not present

## 2021-12-30 DIAGNOSIS — E785 Hyperlipidemia, unspecified: Secondary | ICD-10-CM | POA: Diagnosis not present

## 2021-12-30 DIAGNOSIS — Z0000 Encounter for general adult medical examination without abnormal findings: Secondary | ICD-10-CM | POA: Diagnosis not present

## 2021-12-30 DIAGNOSIS — Z1331 Encounter for screening for depression: Secondary | ICD-10-CM | POA: Diagnosis not present

## 2021-12-30 DIAGNOSIS — Z9181 History of falling: Secondary | ICD-10-CM | POA: Diagnosis not present

## 2022-01-02 ENCOUNTER — Telehealth: Payer: Self-pay | Admitting: Pulmonary Disease

## 2022-01-02 NOTE — Telephone Encounter (Signed)
High-resolution CT chest shows unchanged ILD compared to June 2022. No worsening

## 2022-01-05 ENCOUNTER — Other Ambulatory Visit: Payer: Self-pay | Admitting: Cardiology

## 2022-01-05 ENCOUNTER — Ambulatory Visit (INDEPENDENT_AMBULATORY_CARE_PROVIDER_SITE_OTHER): Payer: Medicare Other | Admitting: Allergy and Immunology

## 2022-01-05 ENCOUNTER — Encounter: Payer: Self-pay | Admitting: Allergy and Immunology

## 2022-01-05 VITALS — BP 144/72 | HR 64 | Resp 12

## 2022-01-05 DIAGNOSIS — L570 Actinic keratosis: Secondary | ICD-10-CM | POA: Diagnosis not present

## 2022-01-05 DIAGNOSIS — M3500 Sicca syndrome, unspecified: Secondary | ICD-10-CM

## 2022-01-05 DIAGNOSIS — I639 Cerebral infarction, unspecified: Secondary | ICD-10-CM

## 2022-01-05 DIAGNOSIS — D841 Defects in the complement system: Secondary | ICD-10-CM

## 2022-01-05 DIAGNOSIS — K219 Gastro-esophageal reflux disease without esophagitis: Secondary | ICD-10-CM

## 2022-01-05 DIAGNOSIS — J3089 Other allergic rhinitis: Secondary | ICD-10-CM

## 2022-01-05 MED ORDER — FLUOROURACIL 0.5 % EX CREA
TOPICAL_CREAM | CUTANEOUS | 3 refills | Status: DC
Start: 2022-01-05 — End: 2022-03-09

## 2022-01-05 NOTE — Progress Notes (Signed)
[place] [facility] [place] [facility] [place]   Follow-up Note  Referring Provider: [name], NP Primary Provider: [name], NP Date of Office Visit: 01/05/2022  Subjective:   Daniel Herrera (DOB: [birth year]) is a 81 y.o. male who returns to the [facility] on 01/05/2022 in re-evaluation of the following:  HPI: Daniel Herrera returns to this clinic in reevaluation of type II hereditary angioedema treated with Asencion Islam, history of allergic rhinitis, history LPR, history of sicca syndrome, and history of interstitial lung disease.  I last saw him in this clinic 07 July 2021.  He has not had any swelling episodes.  He has not had to use any Firazyr.  He has had very little issues with his nose while using Flonase  He continues on a proton pump inhibitor which is resulting in good control of his reflux.  His sicca syndrome is under good control with his current therapy.  He continues to treat his superficial skin cancers with topical 5-FU.  He follows up with cardiology and pulmonology for his SVT and interstitial lung disease respectively.   Allergies as of 01/05/2022       Reactions   Lisinopril Anaphylaxis, Swelling, Other (See Comments)   Face, lips, and throat swell   Oxycodone Other (See Comments)   Hallucination   Tape Other (See Comments)   PLASTIC TAPE TEARS OFF THE SKIN- Please use an alternative!!   Benadryl [diphenhydramine Hcl] Swelling, Other (See Comments)   Facial swelling from high dose (tolerates Advil PM as needed)   Darvocet [propoxyphene N-acetaminophen] Other (See Comments)   Hallucinations   Darvon [propoxyphene Hcl] Other (See Comments)   Hallucination   Tylenol [acetaminophen] Swelling, Other (See Comments)   Foot swelling        Medication List    aspirin EC 81 MG tablet Take 1 tablet (81 mg total) by mouth daily.   Azelastine HCl 137 MCG/SPRAY Soln Place 1 spray into both nostrils 2 (two) times daily.    betamethasone valerate ointment 0.1 % Commonly known as: VALISONE Apply 1 application topically 2 (two) times daily as needed for rash.   dextromethorphan-guaiFENesin 30-600 MG 12hr tablet Commonly known as: MUCINEX DM Take 1 tablet by mouth in the morning and at bedtime.   EPINEPHrine 0.3 mg/0.3 mL Soaj injection Commonly known as: EPI-PEN INJECT AS DIRECTED FOR LIFE-THREATHENING ALLERGIC REACTION   fluoruracil 0.5 % cream Commonly known as: CARAC Apply 1 application topically as needed for rash. For skin cancer flare up   fluticasone 50 MCG/ACT nasal spray Commonly known as: FLONASE Place 1 spray into both nostrils 2 (two) times daily.   glipiZIDE 2.5 MG 24 hr tablet Commonly known as: GLUCOTROL XL Take 2.5 mg by mouth every morning.   icatibant 30 MG/3ML injection Commonly known as: FIRAZYR Inject 3 mLs (30 mg total) into the skin once as needed (for sudden attacks of hereditary angioedema).   levothyroxine 100 MCG tablet Commonly known as: SYNTHROID Take 100 mcg by mouth daily before breakfast.   Livalo 4 MG Tabs Generic drug: Pitavastatin Calcium Take 4 mg by mouth every evening.   magic mouthwash Soln Take 15 mLs by mouth 3 (three) times daily as needed for mouth pain.   metoprolol succinate 50 MG 24 hr tablet Commonly known as: TOPROL-XL Take 1 tablet (50 mg total) by mouth daily. Take with or immediately following a meal. What changed: how much to take   nabumetone 750 MG tablet Commonly  known as: RELAFEN Take 750 mg by mouth 2 (two) times daily.   nitroGLYCERIN 0.4 MG SL tablet Commonly known as: NITROSTAT Place 0.4 mg under the tongue every 5 (five) minutes as needed for chest pain.   OVER THE COUNTER MEDICATION Take 1 capsule by mouth in the morning and at bedtime. Health Plus/Super Colon Cleanse   pantoprazole 40 MG tablet Commonly known as: PROTONIX Take 40 mg by mouth 2 (two) times daily.   PROBIOTIC DAILY PO Take 1 capsule by mouth daily.    Takhzyro 300 MG/2ML Sosy Generic drug: Lanadelumab-flyo INJECT '300MG'$  SUBCUTANEOUSLY EVERY 2 WEEKS   Vitamin B-12 5000 MCG Tbdp Take 5,000 mcg by mouth daily.    Past Medical History:  Diagnosis Date   Abnormal EKG 02/21/2015   Acute lower GI bleeding 05/28/2019   Anginal pain ([place]) 2016   Angio-edema    Anxiety    Arthritis    CAD S/P percutaneous coronary angioplasty    NSTEMI treated with LAD PCI with DES x 03 Feb 2015 Myoview low risk Nov 2017   Cataract    CHF (congestive heart failure) ([place]) 2020   after surgery only   Chronic diarrhea of unknown origin    CKD (chronic kidney disease) stage 3, GFR 30-59 ml/min 12/21/2017   Colitis 05/23/2015   Diverticular hemorrhage    Diverticulosis    Dyslipidemia, goal LDL below 70 02/21/2015   hyperlipidemia    Dysrhythmia    A-fib   Enterotoxigenic Escherichia coli infection 05/24/2015   Essential hypertension 02/21/2015   Geographic tongue    GERD (gastroesophageal reflux disease)    GI bleed 12/21/2017   Glossitis, benign migratory 12/04/2018   Gout    Hematochezia 12/21/2017   Admitted Sept 2019 with lower GI bleeding felt to be secondary to hemorrhoids   Hereditary angioedema type 2 ([place]) 07/28/2017   High cholesterol    History of kidney stones    History of non-ST elevation myocardial infarction (NSTEMI) 02/22/2015   Nov 2016   History of prolonged Q-T interval on ECG    History of prostate cancer    Hoarseness 12/04/2018   Hx of Clostridium difficile infection    Hx of umbilical hernia repair    Hypertension    Hypothyroidism    ILD (interstitial lung disease) ([place]) 12/17/2013   ?related to chicken farm exposure or aspiration pneumonitis from GERD Previously attributed to acute interstitial pneumonitis but based on HRCT this is likely IPF, "probable UIP"   Interstitial lung disease ([place])    Lower GI bleed 06/03/2019   Malignant neoplasm of prostate ([place]) 08/07/2014   Myocardial infarction ([place]) 2016   NSTEMI  (non-ST elevated myocardial infarction) ([place]) 05/21/2018   Open metatarsal fracture 09/02/2020   PAF (paroxysmal atrial fibrillation) ([place]) 06/13/2018   Post CABG- discharged on Amiodarone, he will probably not need this long term   Palpitations 03/15/2015   palpitations    Peripheral vascular disease ([facility])    vericose veins   Personal history of digestive disease    gastric ulcer   Pre-diabetes    Pre-operative clearance 01/02/2018   Prostate cancer ([place])    Prostate enlargement    S/P CABG x 3 05/28/2018    LIMA-LAD, RIMA-RCA, and Lt radial to OM.   Skin cancer    basal cell carcinoma   STEC (Shiga toxin-producing Escherichia coli) infection 05/24/2015   Tachycardia determined by examination of pulse    Thyroid disease    Type 2 diabetes mellitus with unspecified  complications ([place])    Urticaria     Past Surgical History:  Procedure Laterality Date   APPENDECTOMY     BACK SURGERY     CARDIAC CATHETERIZATION N/A 02/21/2015   Procedure: Left Heart Cath;  Surgeon: Lorretta Harp, MD;  Location: [facility];  Service: Cardiovascular;  Laterality: N/A;   CATARACT EXTRACTION, BILATERAL     CORONARY ARTERY BYPASS GRAFT N/A 05/24/2018   Procedure: CORONARY ARTERY BYPASS GRAFTING (CABG), ON PUMP, TIMES THREE, USING BILATERAL INTERNAL MAMMARY ARTERY AND HARVESTED LEFT RADIAL ARTERY;  Surgeon: Gaye Pollack, MD;  Location: [facility];  Service: Open Heart Surgery;  Laterality: N/A;   CYSTOSCOPY WITH LITHOLAPAXY N/A 07/26/2020   Procedure: CYSTOSCOPY WITH LITHOLAPAXY WITH FULGERATION;  Surgeon: Raynelle Bring, MD;  Location: [facility];  Service: Urology;  Laterality: N/A;   HOLMIUM LASER APPLICATION N/A 7/82/4235   Procedure: HOLMIUM LASER APPLICATION;  Surgeon: Raynelle Bring, MD;  Location: [facility];  Service: Urology;  Laterality: N/A;   I & D EXTREMITY Right 09/02/2020   Procedure: IRRIGATION AND DEBRIDEMENT RIGHT 2ND AND 3RD METATARSAL;  Surgeon: Wylene Simmer, MD;  Location: [facility];   Service: Orthopedics;  Laterality: Right;   LEFT HEART CATH AND CORONARY ANGIOGRAPHY N/A 05/22/2018   Procedure: LEFT HEART CATH AND CORONARY ANGIOGRAPHY;  Surgeon: Jettie Booze, MD;  Location: [facility];  Service: Cardiovascular;  Laterality: N/A;   NASAL SINUS SURGERY     ORIF TOE FRACTURE Right 09/02/2020   Procedure: OPEN REDUCTION INTERNAL FIXATION (ORIF) OF RIGHT OPEN 2ND AND 3RD METATARSAL (TOE) OPEN FRACTURE;  Surgeon: Wylene Simmer, MD;  Location: [facility];  Service: Orthopedics;  Laterality: Right;   PROSTATE BIOPSY     RADIAL ARTERY HARVEST Left 05/24/2018   Procedure: RADIAL ARTERY HARVEST;  Surgeon: Gaye Pollack, MD;  Location: [facility];  Service: Open Heart Surgery;  Laterality: Left;   right shoulder rotator cuff repair     TEE WITHOUT CARDIOVERSION N/A 05/24/2018   Procedure: TRANSESOPHAGEAL ECHOCARDIOGRAM (TEE);  Surgeon: Gaye Pollack, MD;  Location: [name];  Service: Open Heart Surgery;  Laterality: N/A;    Review of systems negative except as noted in HPI / PMHx or noted below:  Review of Systems  Constitutional: Negative.   HENT: Negative.    Eyes: Negative.   Respiratory: Negative.    Cardiovascular: Negative.   Gastrointestinal: Negative.   Genitourinary: Negative.   Musculoskeletal: Negative.   Skin: Negative.   Neurological: Negative.   Endo/Heme/Allergies: Negative.   Psychiatric/Behavioral: Negative.       Objective:   Vitals:   01/05/22 1039  BP: (!) 144/72  Pulse: 64  Resp: 12  SpO2: 97%          Physical Exam Constitutional:      Appearance: He is not diaphoretic.  HENT:     Head: Normocephalic.     Right Ear: Tympanic membrane, ear canal and external ear normal.     Left Ear: Tympanic membrane, ear canal and external ear normal.     Nose: Nose normal. No mucosal edema or rhinorrhea.     Mouth/Throat:     Pharynx: Uvula midline. No oropharyngeal exudate.  Eyes:     Conjunctiva/sclera: Conjunctivae normal.  Neck:      Thyroid: No thyromegaly.     Trachea: Trachea normal. No tracheal tenderness or tracheal deviation.  Cardiovascular:     Rate and Rhythm: Normal rate and regular rhythm.     Heart  sounds: Normal heart sounds, S1 normal and S2 normal. No murmur heard. Pulmonary:     Effort: No respiratory distress.     Breath sounds: Normal breath sounds. No stridor. No wheezing or rales.  Lymphadenopathy:     Head:     Right side of head: No tonsillar adenopathy.     Left side of head: No tonsillar adenopathy.     Cervical: No cervical adenopathy.  Skin:    Findings: No erythema or rash.     Nails: There is no clubbing.  Neurological:     Mental Status: He is alert.     Diagnostics:None   Assessment and Plan:   1. Hereditary angioedema type 2 ([place])   2. Perennial allergic rhinitis   3. LPRD (laryngopharyngeal reflux disease)   4. Sicca syndrome ([place])   5. Actinic keratoses    1.  Continue Takhzryo every 14 days.   2.  Continue to Treat and prevent inflammation:    A.  Flonase - 1 spray each nostril twice a day  3.  Continue to Treat and prevent reflux:   A.  pantoprazole 40 mg twice a day  4.  If needed:   A.  Nasal saline spray  B.  OTC Mucinex DM - 2 tablets twice a day  C.  Firazyr injection for swelling reaction  D.  Carac topical - apply to skin cancer for 1-7 days during 'flare up'  E.  Systane eye drops  5.  Return to clinic in 6 months or earlier if problem  6.  Obtain flu vaccine and RSV vaccine   Yakub appears to be doing quite well while he continues on Takhzyro to address his hereditary angioedema and he is doing quite well regarding his airway issue and his reflux issue on the therapy noted above.  He has topical 5-FU should it be required for his intermittent superficial actinic keratoses.  I will see him back in this clinic in 6 months or earlier if there is a problem.   Allena Katz, MD Allergy / Immunology [place] [facility]

## 2022-01-05 NOTE — Patient Instructions (Signed)
  1.  Continue Takhzryo every 14 days.   2.  Continue to Treat and prevent inflammation:    A.  Flonase - 1 spray each nostril twice a day  3.  Continue to Treat and prevent reflux:   A.  pantoprazole 40 mg twice a day  4.  If needed:   A.  Nasal saline spray  B.  OTC Mucinex DM - 2 tablets twice a day  C.  Firazyr injection for swelling reaction  D.  Carac topical - apply to skin cancer for 1-7 days during 'flare up'  E.  Systane eye drops  5.  Return to clinic in 6 months or earlier if problem  6.  Obtain flu vaccine and RSV vaccine

## 2022-01-08 ENCOUNTER — Encounter: Payer: Self-pay | Admitting: Allergy and Immunology

## 2022-01-13 DIAGNOSIS — D649 Anemia, unspecified: Secondary | ICD-10-CM | POA: Diagnosis not present

## 2022-01-13 DIAGNOSIS — E785 Hyperlipidemia, unspecified: Secondary | ICD-10-CM | POA: Diagnosis not present

## 2022-01-13 DIAGNOSIS — E039 Hypothyroidism, unspecified: Secondary | ICD-10-CM | POA: Diagnosis not present

## 2022-01-13 DIAGNOSIS — N1831 Chronic kidney disease, stage 3a: Secondary | ICD-10-CM | POA: Diagnosis not present

## 2022-01-13 DIAGNOSIS — I2510 Atherosclerotic heart disease of native coronary artery without angina pectoris: Secondary | ICD-10-CM | POA: Diagnosis not present

## 2022-01-13 DIAGNOSIS — E1169 Type 2 diabetes mellitus with other specified complication: Secondary | ICD-10-CM | POA: Diagnosis not present

## 2022-01-13 DIAGNOSIS — E538 Deficiency of other specified B group vitamins: Secondary | ICD-10-CM | POA: Diagnosis not present

## 2022-01-13 DIAGNOSIS — D841 Defects in the complement system: Secondary | ICD-10-CM | POA: Diagnosis not present

## 2022-01-13 DIAGNOSIS — I10 Essential (primary) hypertension: Secondary | ICD-10-CM | POA: Diagnosis not present

## 2022-01-13 DIAGNOSIS — E559 Vitamin D deficiency, unspecified: Secondary | ICD-10-CM | POA: Diagnosis not present

## 2022-01-25 DIAGNOSIS — Z23 Encounter for immunization: Secondary | ICD-10-CM | POA: Diagnosis not present

## 2022-02-17 ENCOUNTER — Other Ambulatory Visit: Payer: Self-pay | Admitting: Allergy and Immunology

## 2022-02-17 NOTE — Telephone Encounter (Signed)
Please advise to change in medication.

## 2022-03-09 ENCOUNTER — Other Ambulatory Visit: Payer: Self-pay | Admitting: *Deleted

## 2022-03-09 MED ORDER — FLUOROURACIL 0.5 % EX CREA: TOPICAL_CREAM | CUTANEOUS | 3 refills | Status: AC

## 2022-03-21 ENCOUNTER — Other Ambulatory Visit: Payer: Self-pay | Admitting: *Deleted

## 2022-03-21 MED ORDER — ICATIBANT ACETATE 30 MG/3ML ~~LOC~~ SOSY: 30.0000 mg | PREFILLED_SYRINGE | SUBCUTANEOUS | 3 refills | Status: DC | PRN

## 2022-03-24 DIAGNOSIS — H1131 Conjunctival hemorrhage, right eye: Secondary | ICD-10-CM | POA: Diagnosis not present

## 2022-04-11 DIAGNOSIS — L814 Other melanin hyperpigmentation: Secondary | ICD-10-CM | POA: Diagnosis not present

## 2022-04-11 DIAGNOSIS — L570 Actinic keratosis: Secondary | ICD-10-CM | POA: Diagnosis not present

## 2022-04-11 DIAGNOSIS — L821 Other seborrheic keratosis: Secondary | ICD-10-CM | POA: Diagnosis not present

## 2022-04-11 DIAGNOSIS — C44329 Squamous cell carcinoma of skin of other parts of face: Secondary | ICD-10-CM | POA: Diagnosis not present

## 2022-04-11 DIAGNOSIS — L578 Other skin changes due to chronic exposure to nonionizing radiation: Secondary | ICD-10-CM | POA: Diagnosis not present

## 2022-05-01 IMAGING — DX DG CHEST 2V
2 series · 2 of 2 positions shown · non-contrast
Comparison: 07/27/2020

CLINICAL DATA: Shortness of breath.

EXAM:
CHEST - 2 VIEW

[chest pa]
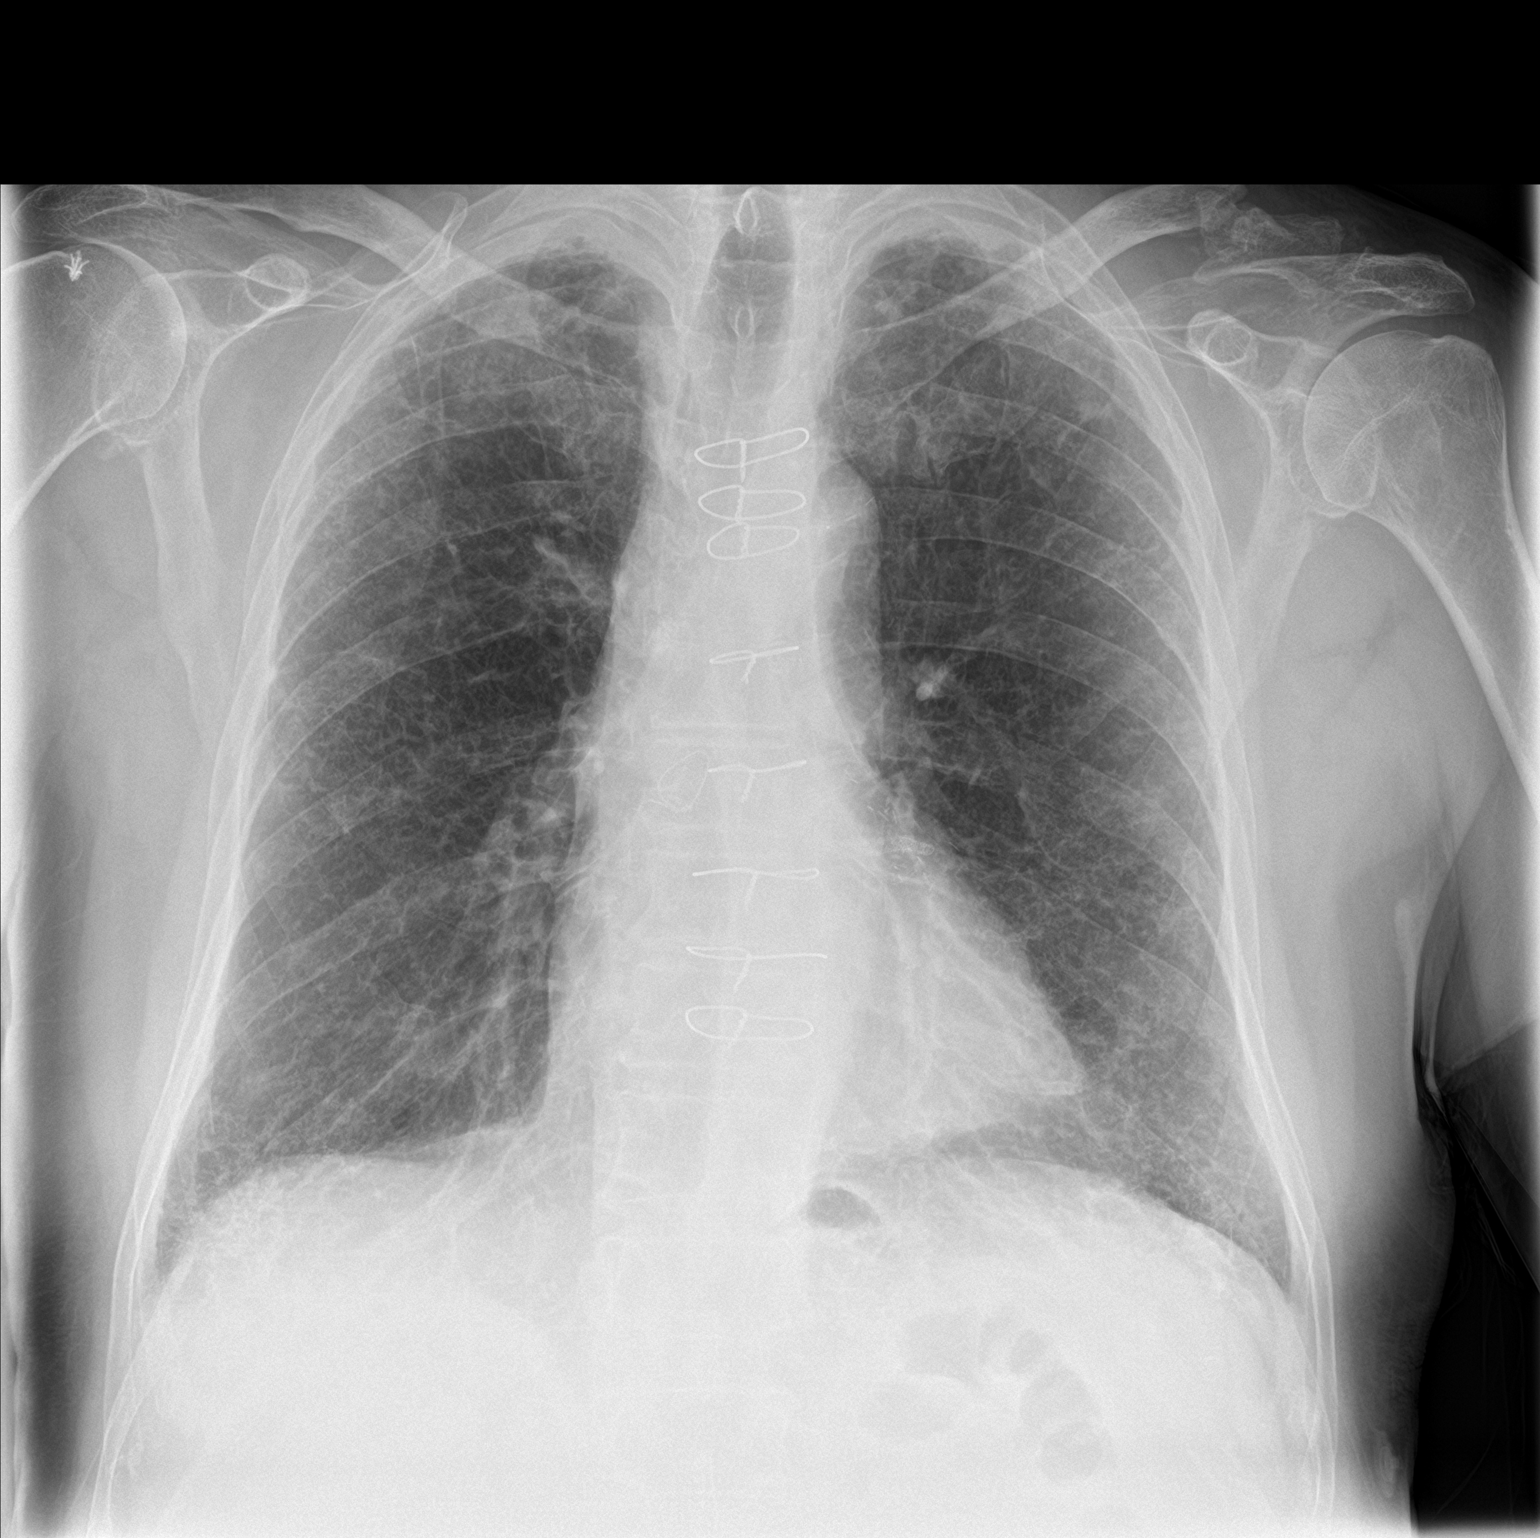

[chest lat]
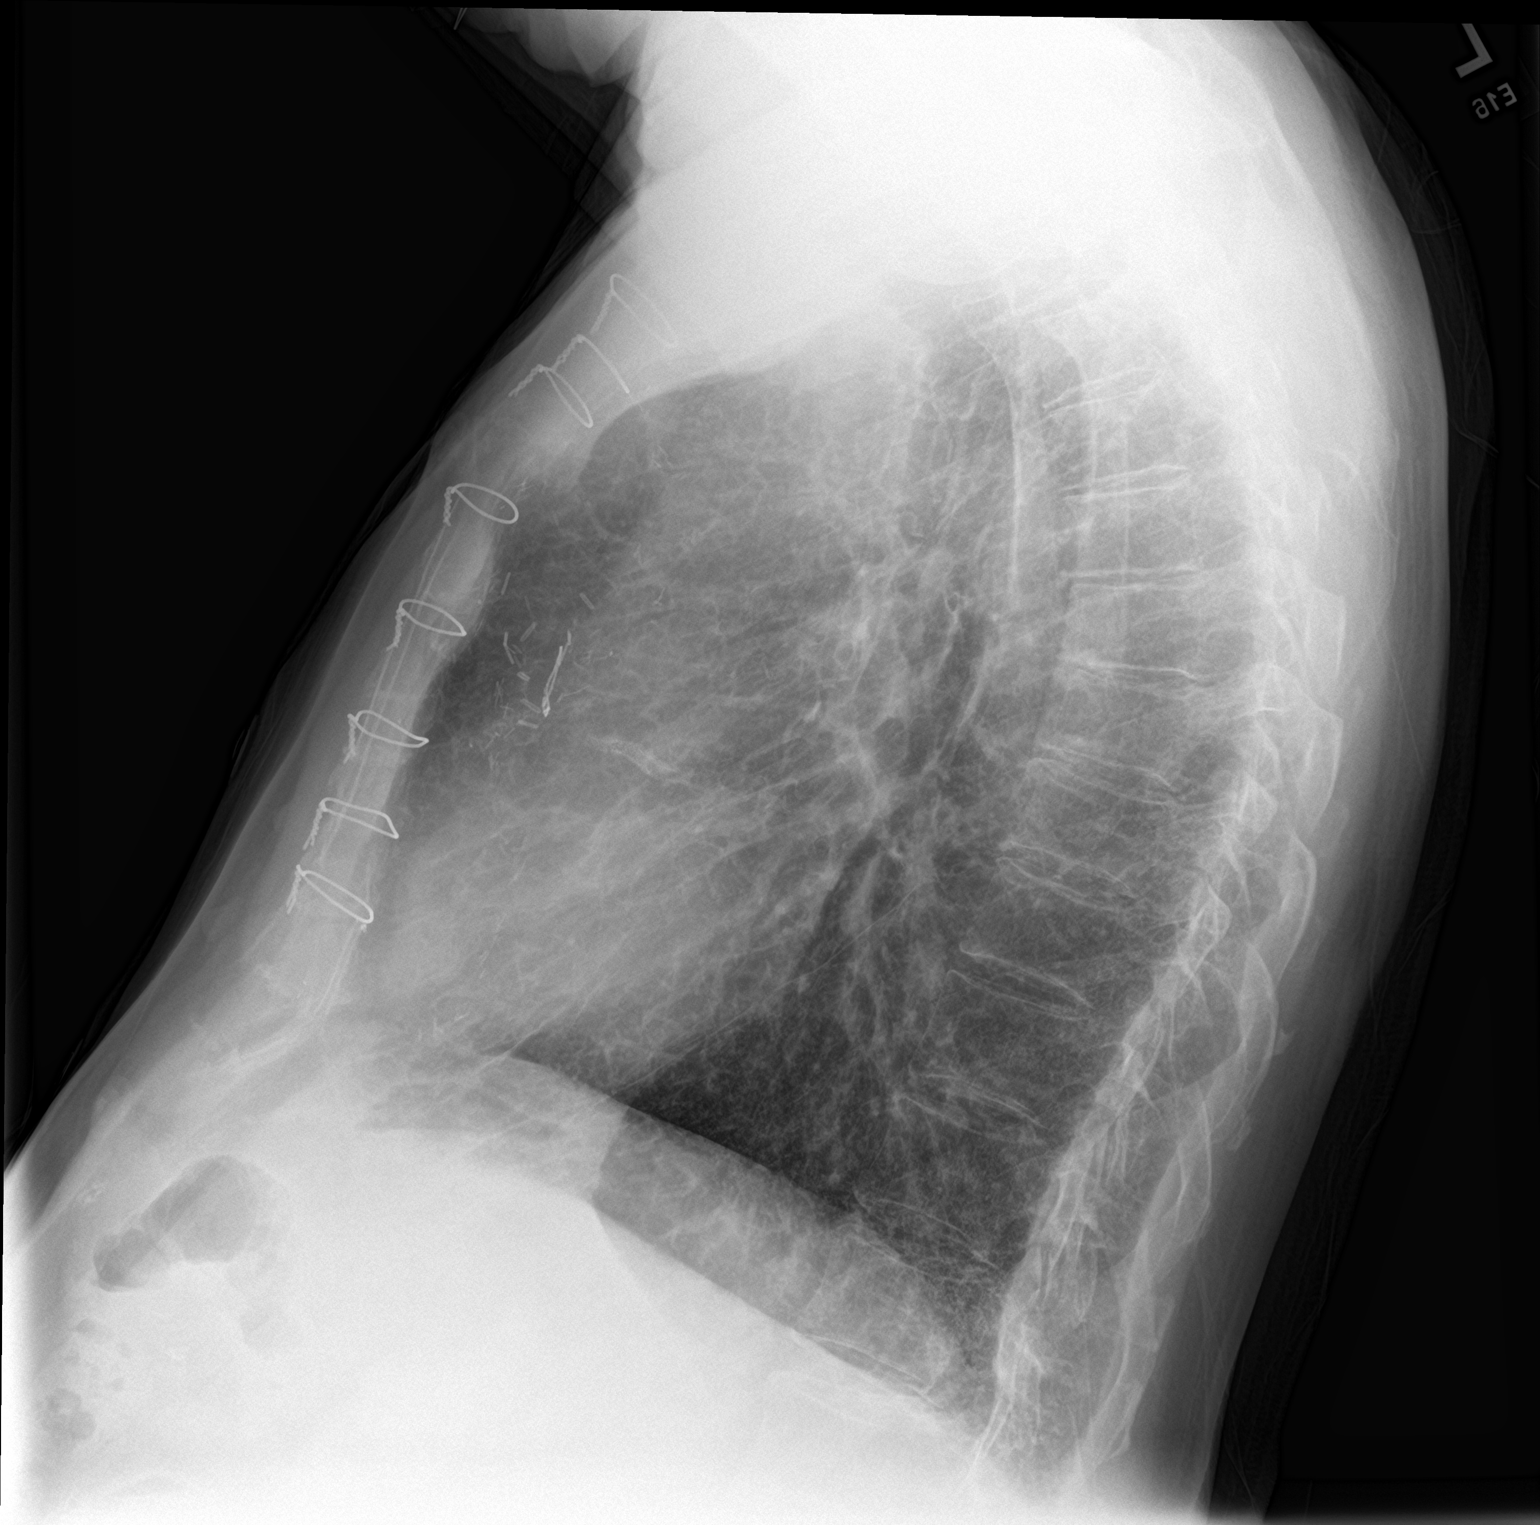

[2 of 2 positions shown; findings below may reference images not displayed]

FINDINGS: Previous median sternotomy and CABG procedure. Normal heart size.
Lungs are hyperinflated and there are diffuse reticular interstitial
opacities throughout both lungs likely reflecting chronic lung
disease. No signs of superimposed pleural effusion, airspace
consolidation or pneumothorax. Remote deformity involving the distal
aspect of the left clavicle identified.
IMPRESSION: 1. No acute cardiopulmonary abnormalities.
2. Chronic increase reticular interstitial markings compatible with
previously characterized usual interstitial pneumonia (UIP).

## 2022-05-01 IMAGING — CT CT HEAD W/O CM
4 series · 15 of 47 positions shown, 17 images · non-contrast
Comparison: CT brain, MR brain, 06/14/2021

CLINICAL DATA: Worsening headache, recent stroke



[Series 3: head wo · axial · 0.43mm/px · z∈[-656,-541]mm · 7 of 31 slices shown, 9 images]
[im 4/31  brain]
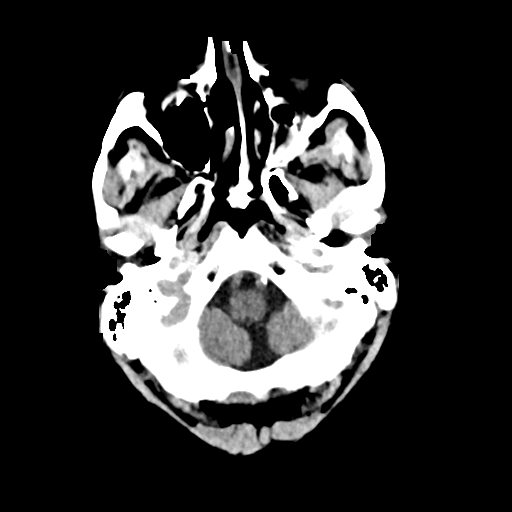
[im 4/31  bone]
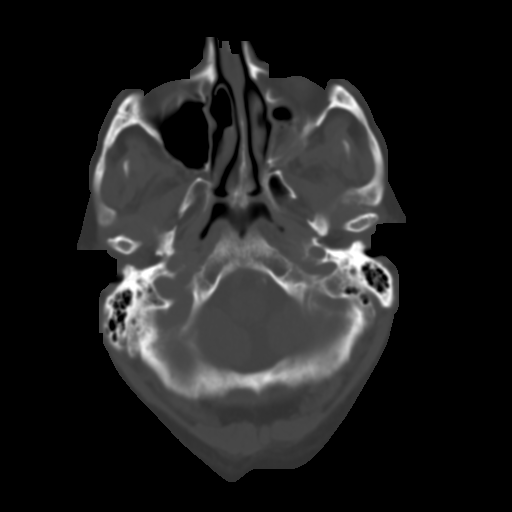
[im 8/31  brain]
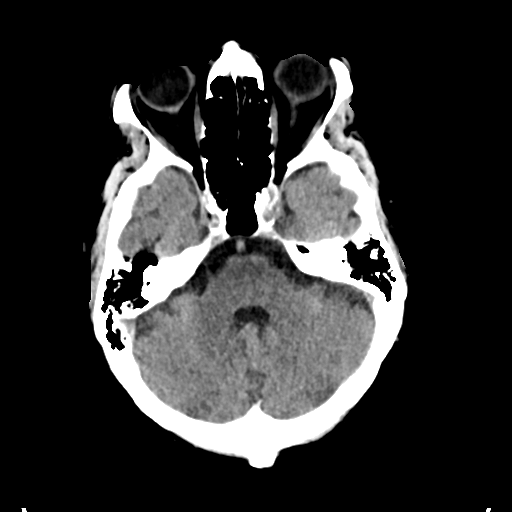
[im 12/31  brain]
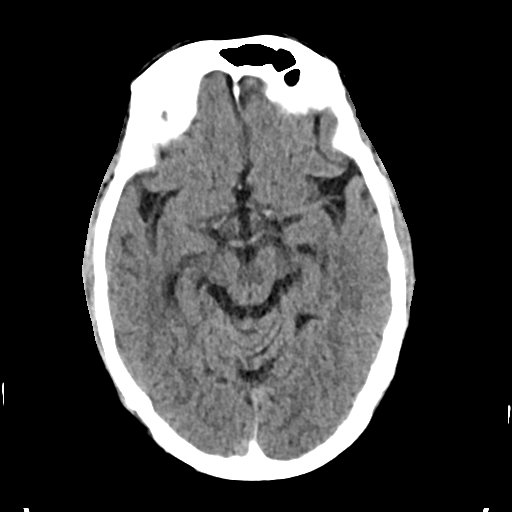
[im 16/31  brain]
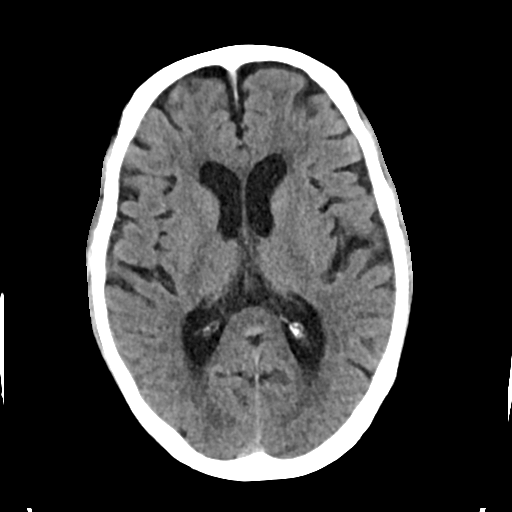
[im 19/31  brain]
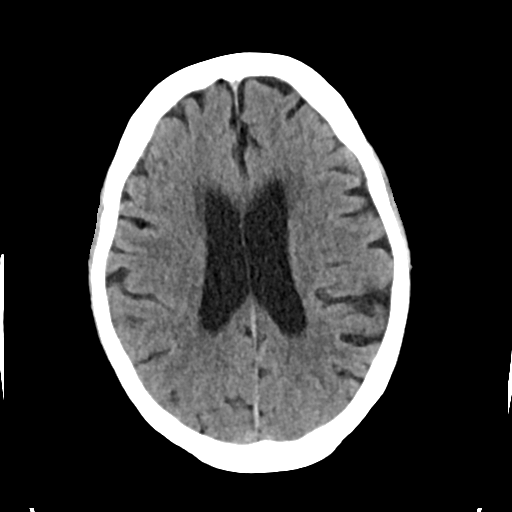
[im 19/31  bone]
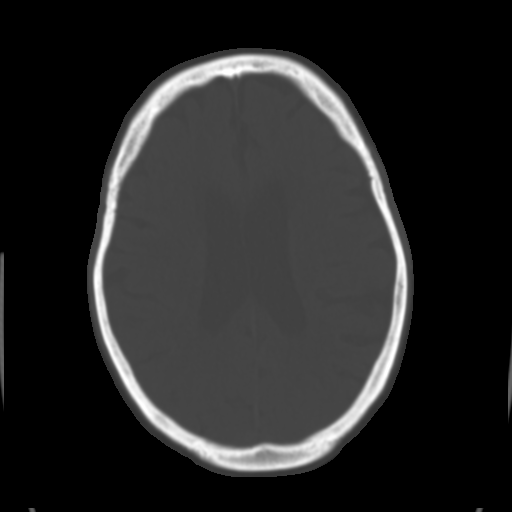
[im 23/31  brain]
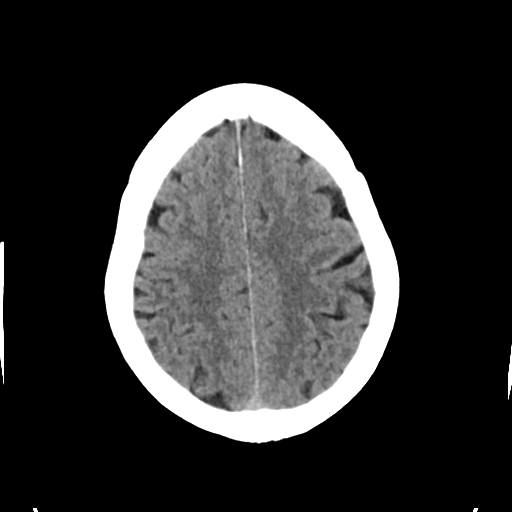
[im 27/31  brain]
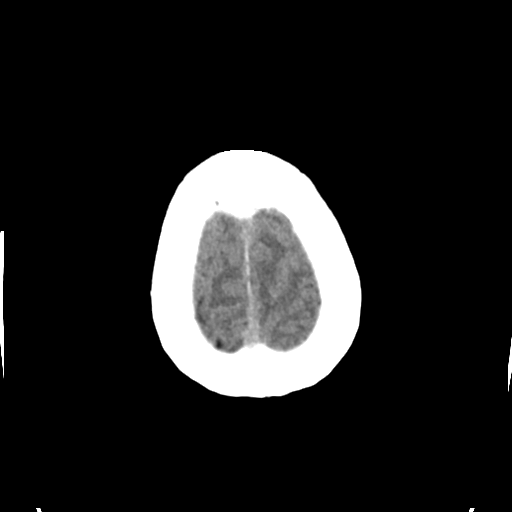

[Series 4: head bone · axial · 0.43mm/px · z∈[-657,-641]mm · 2 of 77 slices shown]
[im 8/77  bone]
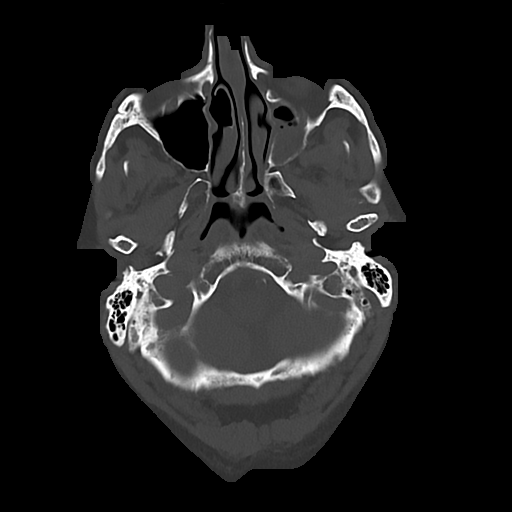
[im 16/77  bone]
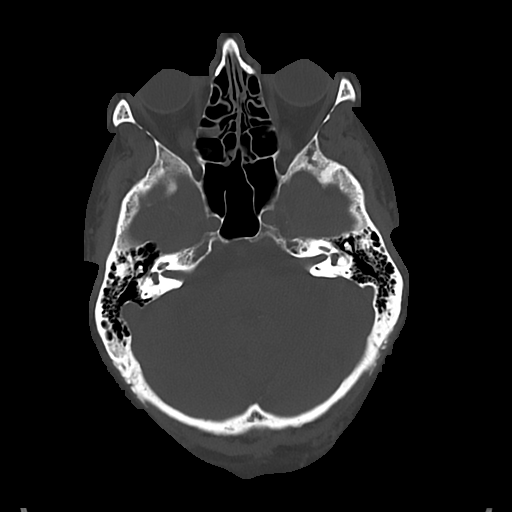

[Series 5: cor soft · coronal · 0.29mm/px · 3 of 74 slices shown]
[im 25/74  brain]
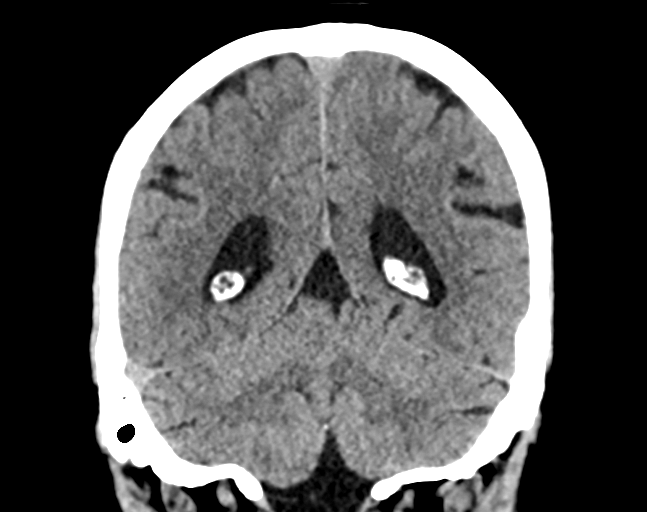
[im 33/74  brain]
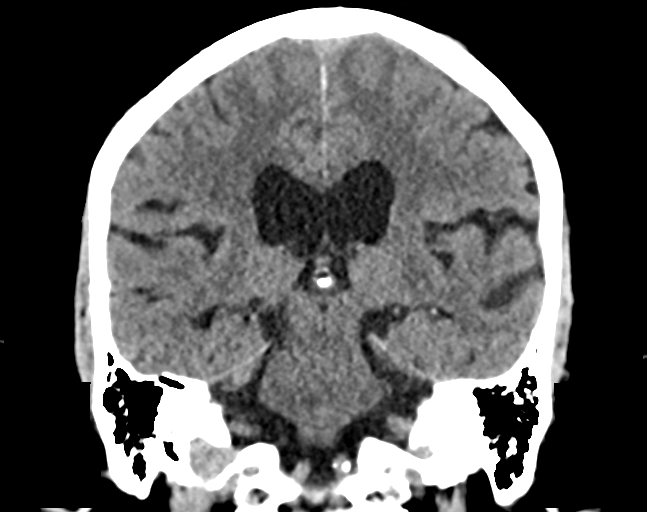
[im 41/74  brain]
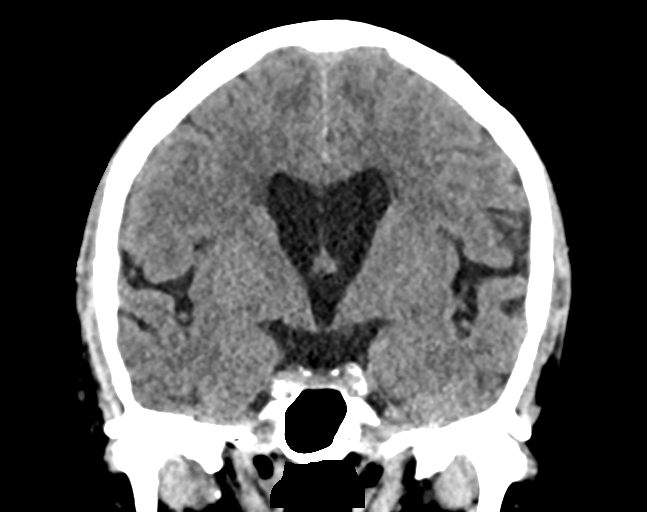

[Series 6: sag soft · sagittal · 0.29mm/px · 3 of 63 slices shown]
[im 21/63  brain]
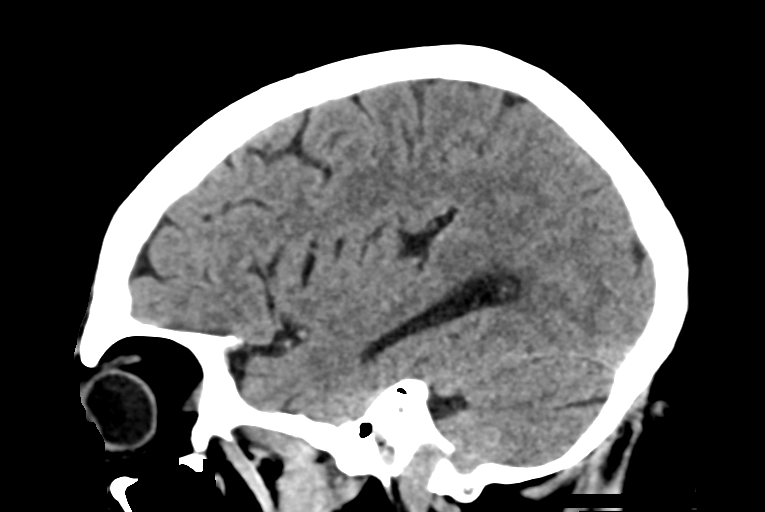
[im 32/63  brain]
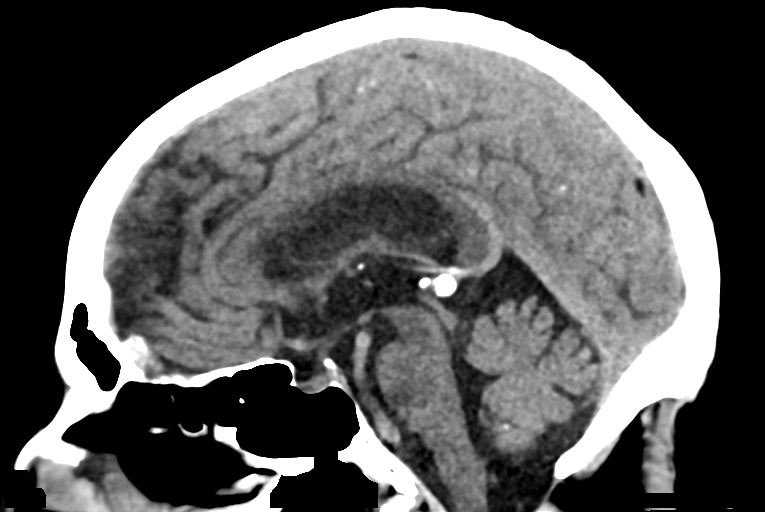
[im 42/63  brain]
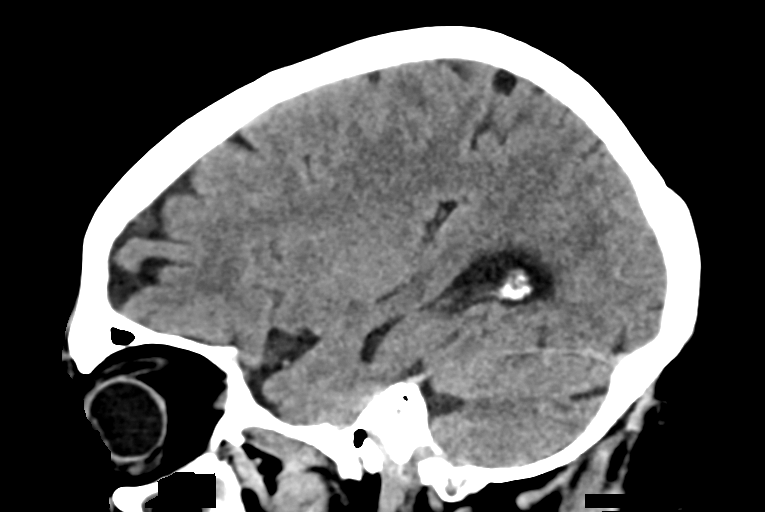

[15 of 47 positions shown; findings below may reference images not displayed]

FINDINGS: Brain: No evidence of acute infarction, hemorrhage, hydrocephalus,
extra-axial collection or mass lesion/mass effect. Mild
periventricular and deep white matter hypodensity

Vascular: No hyperdense vessel or unexpected calcification.

Skull: Normal. Negative for fracture or focal lesion.

Sinuses/Orbits: Unchanged air-fluid level of the left maxillary
sinus.

Other: None.
IMPRESSION: 1. No acute intracranial pathology. Small-vessel white matter
disease.
2. Unchanged air-fluid level of the left maxillary sinus. Correlate
for sinusitis.

## 2022-05-19 DIAGNOSIS — C61 Malignant neoplasm of prostate: Secondary | ICD-10-CM | POA: Diagnosis not present

## 2022-05-19 DIAGNOSIS — E785 Hyperlipidemia, unspecified: Secondary | ICD-10-CM | POA: Diagnosis not present

## 2022-05-19 DIAGNOSIS — E559 Vitamin D deficiency, unspecified: Secondary | ICD-10-CM | POA: Diagnosis not present

## 2022-05-19 DIAGNOSIS — D649 Anemia, unspecified: Secondary | ICD-10-CM | POA: Diagnosis not present

## 2022-05-19 DIAGNOSIS — D841 Defects in the complement system: Secondary | ICD-10-CM | POA: Diagnosis not present

## 2022-05-19 DIAGNOSIS — E1169 Type 2 diabetes mellitus with other specified complication: Secondary | ICD-10-CM | POA: Diagnosis not present

## 2022-05-19 DIAGNOSIS — I2510 Atherosclerotic heart disease of native coronary artery without angina pectoris: Secondary | ICD-10-CM | POA: Diagnosis not present

## 2022-05-19 DIAGNOSIS — I700 Atherosclerosis of aorta: Secondary | ICD-10-CM | POA: Diagnosis not present

## 2022-05-19 DIAGNOSIS — N1831 Chronic kidney disease, stage 3a: Secondary | ICD-10-CM | POA: Diagnosis not present

## 2022-05-19 DIAGNOSIS — I10 Essential (primary) hypertension: Secondary | ICD-10-CM | POA: Diagnosis not present

## 2022-05-19 DIAGNOSIS — E039 Hypothyroidism, unspecified: Secondary | ICD-10-CM | POA: Diagnosis not present

## 2022-05-19 DIAGNOSIS — E538 Deficiency of other specified B group vitamins: Secondary | ICD-10-CM | POA: Diagnosis not present

## 2022-05-20 LAB — COMPREHENSIVE METABOLIC PANEL: eGFR: 44

## 2022-05-20 LAB — HEMOGLOBIN A1C: Hemoglobin A1c: 6.3

## 2022-05-27 ENCOUNTER — Emergency Department (HOSPITAL_COMMUNITY)
Admission: EM | Admit: 2022-05-27 | Discharge: 2022-05-27 | Disposition: A | Discharge status: Home / Self Care | Payer: Medicare Other | Attending: Emergency Medicine | Admitting: Emergency Medicine

## 2022-05-27 ENCOUNTER — Other Ambulatory Visit: Payer: Self-pay

## 2022-05-27 ENCOUNTER — Encounter (HOSPITAL_COMMUNITY): Payer: Self-pay | Admitting: Emergency Medicine

## 2022-05-27 DIAGNOSIS — E039 Hypothyroidism, unspecified: Secondary | ICD-10-CM | POA: Diagnosis not present

## 2022-05-27 DIAGNOSIS — E119 Type 2 diabetes mellitus without complications: Secondary | ICD-10-CM | POA: Insufficient documentation

## 2022-05-27 DIAGNOSIS — Z955 Presence of coronary angioplasty implant and graft: Secondary | ICD-10-CM | POA: Diagnosis not present

## 2022-05-27 DIAGNOSIS — Z79899 Other long term (current) drug therapy: Secondary | ICD-10-CM | POA: Diagnosis not present

## 2022-05-27 DIAGNOSIS — I2510 Atherosclerotic heart disease of native coronary artery without angina pectoris: Secondary | ICD-10-CM | POA: Insufficient documentation

## 2022-05-27 DIAGNOSIS — Z7984 Long term (current) use of oral hypoglycemic drugs: Secondary | ICD-10-CM | POA: Insufficient documentation

## 2022-05-27 DIAGNOSIS — I10 Essential (primary) hypertension: Secondary | ICD-10-CM | POA: Diagnosis not present

## 2022-05-27 DIAGNOSIS — Z7982 Long term (current) use of aspirin: Secondary | ICD-10-CM | POA: Diagnosis not present

## 2022-05-27 LAB — COMPREHENSIVE METABOLIC PANEL
ALT: 22 U/L (ref 0–44)
AST: 29 U/L (ref 15–41)
Albumin: 3.9 g/dL (ref 3.5–5.0)
Alkaline Phosphatase: 66 U/L (ref 38–126)
Anion gap: 12 (ref 5–15)
BUN: 18 mg/dL (ref 8–23)
CO2: 22 mmol/L (ref 22–32)
Calcium: 9 mg/dL (ref 8.9–10.3)
Chloride: 100 mmol/L (ref 98–111)
Creatinine, Ser: 1.59 mg/dL — ABNORMAL HIGH (ref 0.61–1.24)
GFR, Estimated: 43 mL/min — ABNORMAL LOW (ref >60)
Glucose, Bld: 172 mg/dL — ABNORMAL HIGH (ref 70–99)
Potassium: 3.5 mmol/L (ref 3.5–5.1)
Sodium: 134 mmol/L — ABNORMAL LOW (ref 135–145)
Total Bilirubin: 1.2 mg/dL (ref 0.3–1.2)
Total Protein: 6.5 g/dL (ref 6.5–8.1)

## 2022-05-27 LAB — TSH: TSH: 2.637 u[IU]/mL (ref 0.350–4.500)

## 2022-05-27 LAB — TROPONIN I (HIGH SENSITIVITY)
Troponin I (High Sensitivity): 15 ng/L (ref <18)
Troponin I (High Sensitivity): 18 ng/L — ABNORMAL HIGH (ref <18)

## 2022-05-27 LAB — CBC WITH DIFFERENTIAL/PLATELET
Abs Immature Granulocytes: 0.02 10*3/uL (ref 0.00–0.07)
Basophils Absolute: 0 10*3/uL (ref 0.0–0.1)
Basophils Relative: 1 %
Eosinophils Absolute: 0.1 10*3/uL (ref 0.0–0.5)
Eosinophils Relative: 1 %
HCT: 42.4 % (ref 39.0–52.0)
Hemoglobin: 14.8 g/dL (ref 13.0–17.0)
Immature Granulocytes: 0 %
Lymphocytes Relative: 12 %
Lymphs Abs: 0.6 10*3/uL — ABNORMAL LOW (ref 0.7–4.0)
MCH: 32.1 pg (ref 26.0–34.0)
MCHC: 34.9 g/dL (ref 30.0–36.0)
MCV: 92 fL (ref 80.0–100.0)
Monocytes Absolute: 0.4 10*3/uL (ref 0.1–1.0)
Monocytes Relative: 8 %
Neutro Abs: 4 10*3/uL (ref 1.7–7.7)
Neutrophils Relative %: 78 %
Platelets: 166 10*3/uL (ref 150–400)
RBC: 4.61 MIL/uL (ref 4.22–5.81)
RDW: 12.4 % (ref 11.5–15.5)
WBC: 5.1 10*3/uL (ref 4.0–10.5)
nRBC: 0 % (ref 0.0–0.2)

## 2022-05-27 LAB — T4, FREE: Free T4: 1.34 ng/dL — ABNORMAL HIGH (ref 0.61–1.12)

## 2022-05-27 MED ORDER — HYDRALAZINE HCL 10 MG PO TABS
10.0000 mg | ORAL_TABLET | Freq: Once | ORAL | Status: AC
  Administered 2022-05-27: 10 mg via ORAL
  Filled 2022-05-27: qty 1

## 2022-05-27 MED ORDER — HYDRALAZINE HCL 10 MG PO TABS: 10.0000 mg | ORAL_TABLET | Freq: Three times a day (TID) | ORAL | 0 refills | Status: DC | PRN

## 2022-05-27 NOTE — ED Triage Notes (Signed)
Pt states he was checking his blood pressure at home and it was 200/175. Pt states his blood pressure is normally 140/80. Compliant with blood pressure medications. Denies headache, chest pain or shortness of breath.

## 2022-05-27 NOTE — Discharge Instructions (Signed)
You were evaluated in the emergency department, your labs looked okay.  We did not find any evidence of organ dysfunction.  I am sending you home on a medication to be used as needed until you follow-up with your primary care doctor to discuss your blood pressure regimen.  If the medications drop your blood pressure too much, you may cut the dose in half.  Please return to the ED if you have any chest pain, short of breath, confusion, or weakness.

## 2022-05-27 NOTE — ED Provider Notes (Signed)
[facility] Provider Note   CSN: [id number] Arrival date & time: 05/27/22  1802     History  Chief Complaint  Patient presents with   Hypertension    Daniel Herrera is a 82 y.o. male.  This is an 82 year old male with history of CAD status post CABG, hypothyroidism on levothyroxine, type 2 diabetes, and paroxysmal A-fib not on anticoagulation presenting to the ED for high blood pressure.  Patient states he has had about a week of elevated blood pressures, his typical blood pressure is 140/80.  Over the last week it has been in the 160s to 180s over 100s.  He saw his PCP who recommended monitoring at home and potentially starting hypertensive medications if it is persistently elevated.  Today he noticed it was 200/170 and he was feeling some pounding in his chest, he subsequently came to the emergency department for evaluation.  He denies any chest discomfort or shortness of breath, he is making normal urine, no abdominal pain, no dizziness, no syncope.        Home Medications Prior to Admission medications   Medication Sig Start Date End Date Taking? Authorizing Provider  hydrALAZINE (APRESOLINE) 10 MG tablet Take 1 tablet (10 mg total) by mouth 3 (three) times daily as needed for up to 10 doses. Please take for systolic blood pressure [id number] 05/27/22  Yes Jimmie Molly, MD  aspirin 81 MG EC tablet Take 1 tablet (81 mg total) by mouth daily. 07/07/21   Melvenia Beam, MD  Azelastine HCl 137 MCG/SPRAY SOLN Place 1 spray into both nostrils 2 (two) times daily. 02/05/21   [provider]  betamethasone valerate ointment (VALISONE) 0.1 % Apply 1 application topically 2 (two) times daily as needed for rash. 01/18/21   [provider]  Cyanocobalamin (VITAMIN B-12) 5000 MCG TBDP Take 5,000 mcg by mouth daily.     [provider]  dextromethorphan-guaiFENesin (MUCINEX DM) 30-600 MG 12hr tablet Take 1 tablet by mouth in  the morning and at bedtime.    [provider]  EPINEPHRINE 0.3 mg/0.3 mL IJ SOAJ injection INJECT AS DIRECTED FOR LIFE-THREATHENING ALLERGIC REACTION 09/09/21   Kozlow, Donnamarie Poag, MD  fluoruracil [facility]) 0.5 % cream Apply to skin cancer for 1-7 days during 'flare up' as directed. 03/09/22   Kozlow, Donnamarie Poag, MD  fluticasone (FLONASE) 50 MCG/ACT nasal spray Place 1 spray into both nostrils 2 (two) times daily.    [provider]  glipiZIDE (GLUCOTROL XL) 2.5 MG 24 hr tablet Take 2.5 mg by mouth every morning. Patient not taking: Reported on 01/05/2022 10/21/20   [provider]  icatibant Acetate (FIRAZYR) 30 MG/3ML injection Inject 3 mLs (30 mg total) into the skin as needed. 03/21/22   Kozlow, Donnamarie Poag, MD  levothyroxine (SYNTHROID) 100 MCG tablet Take 100 mcg by mouth daily before breakfast.    [provider]  magic mouthwash SOLN Take 15 mLs by mouth 3 (three) times daily as needed for mouth pain.    [provider]  metoprolol succinate (TOPROL-XL) 50 MG 24 hr tablet Take 1 tablet (50 mg total) by mouth daily. Take with or immediately following a meal. Patient taking differently: Take 25 mg by mouth daily. Take with or immediately following a meal. 10/03/21   Camnitz, Ocie Doyne, MD  nabumetone (RELAFEN) 750 MG tablet Take 750 mg by mouth 2 (two) times daily. 08/09/19   [provider]  nitroGLYCERIN (NITROSTAT) 0.4 MG SL  tablet Place 0.4 mg under the tongue every 5 (five) minutes as needed for chest pain.    [provider]  OVER THE COUNTER MEDICATION Take 1 capsule by mouth in the morning and at bedtime. Health Plus/Super Colon Cleanse    [provider]  pantoprazole (PROTONIX) 40 MG tablet Take 40 mg by mouth 2 (two) times daily.    [provider]  Pitavastatin Calcium (LIVALO) 4 MG TABS Take 4 mg by mouth every evening.    [provider]  Probiotic Product (PROBIOTIC DAILY PO) Take 1 capsule by mouth daily.     [provider]  TAKHZYRO 300 MG/2ML SOSY INJECT '300MG'$  SUBCUTANEOUSLY EVERY 2 WEEKS 07/29/21   Kozlow, Donnamarie Poag, MD      Allergies    Lisinopril, Oxycodone, Tape, Benadryl [diphenhydramine hcl], Darvocet [propoxyphene n-acetaminophen], Darvon [propoxyphene hcl], and Tylenol [acetaminophen]    Review of Systems   Review of Systems  Constitutional:  Negative for chills and fever.  Respiratory:  Negative for shortness of breath.   Cardiovascular:  Negative for chest pain.  Gastrointestinal:  Negative for abdominal pain.  Musculoskeletal:  Negative for back pain and neck pain.  Neurological:  Negative for syncope.    Physical Exam Updated Vital Signs BP (!) 176/90   Pulse 72   Temp 98.6 F (37 C) (Oral)   Resp 13   Ht '6\' 1"'$  (1.854 m)   Wt 87.5 kg   SpO2 100%   BMI 25.46 kg/m  Physical Exam Vitals and nursing note reviewed.  Constitutional:      General: He is not in acute distress.    Appearance: He is well-developed.  HENT:     Head: Normocephalic and atraumatic.  Eyes:     Conjunctiva/sclera: Conjunctivae normal.  Cardiovascular:     Rate and Rhythm: Normal rate and regular rhythm.     Heart sounds: No murmur heard. Pulmonary:     Effort: Pulmonary effort is normal. No respiratory distress.     Breath sounds: Normal breath sounds.  Abdominal:     Palpations: Abdomen is soft.     Tenderness: There is no abdominal tenderness.  Musculoskeletal:        General: No swelling.     Cervical back: Neck supple.  Skin:    General: Skin is warm and dry.     Capillary Refill: Capillary refill takes less than 2 seconds.  Neurological:     Mental Status: He is alert.  Psychiatric:        Mood and Affect: Mood normal.     ED Results / Procedures / Treatments   Labs (all labs ordered are listed, but only abnormal results are displayed) Labs Reviewed  COMPREHENSIVE METABOLIC PANEL - Abnormal; Notable for the following components:      Result Value   Sodium 134 (*)     Glucose, Bld 172 (*)    Creatinine, Ser 1.59 (*)    GFR, Estimated 43 (*)    All other components within normal limits  CBC WITH DIFFERENTIAL/PLATELET - Abnormal; Notable for the following components:   Lymphs Abs 0.6 (*)    All other components within normal limits  T4, FREE - Abnormal; Notable for the following components:   Free T4 1.34 (*)    All other components within normal limits  TROPONIN I (HIGH SENSITIVITY) - Abnormal; Notable for the following components:   Troponin I (High Sensitivity) 18 (*)    All other components within normal limits  TSH  TROPONIN I (HIGH SENSITIVITY)    EKG None  Radiology No results found.  Procedures Procedures    Medications Ordered in ED Medications  hydrALAZINE (APRESOLINE) tablet 10 mg (10 mg Oral Given 05/27/22 2243)    ED Course/ Medical Decision Making/ A&P                             Medical Decision Making Patient presents with asymptomatic hypertension, he did feel like his heart was beating hard and was just, however he denies any chest pain or shortness of breath.  He has significant history of CAD with a CABG, will obtain serial troponins and an EKG to rule out ACS or evidence of endorgan dysfunction.  Also obtain a CMP to rule out renal dysfunction as well as thyroid studies as patient is on thyroid medications.  I personally reviewed and interpreted patient's labs which shows stable creatinine of 1.59, previous 1.54, CMP otherwise unremarkable, initial troponin 15, repeat 18.  On chart review patient has had troponins of 14 and 17 on her prior ED visits, he seems to have elevated troponins at baseline.  CBC unremarkable, TSH and free T4 similar to prior.  Personally reviewed and interpreted patient's EKG which shows sinus rhythm with first-degree AV block, occasional PVCs, no acute ischemic changes.  Upon reevaluation patient continues to be asymptomatic.  Denies any chest pain or shortness of breath.  He has no evidence  of organ dysfunction.  I discussed with patient we will start him on a as needed blood pressure medication and allow his PCP to start him on a regimen.  Patient given 10 mg hydralazine prior to discharge for blood pressure of 176/100.  Patient given prescription for 10 tablets of 10 mg hydralazine for systolics greater than [id number].  Precautions given if he were to have any chest pain or shortness of breath to return to the ED for evaluation.  Recommend follow-up with PCP in 2 days to discuss blood pressure regimen.  Discussed if the hydralazine makes him dizzy or weak, to either stop the medication or cut the dose in half.  Patient was stable at discharge.  Problems Addressed: Hypertension, unspecified type: acute illness or injury that poses a threat to life or bodily functions  Amount and/or Complexity of Data Reviewed External Data Reviewed: notes.    Details: Patient has history of SVT, seen by EP on 10/03/2021 Labs: ordered. Decision-making details documented in ED Course. ECG/medicine tests: ordered and independent interpretation performed. Decision-making details documented in ED Course.  Risk Prescription drug management.         Final Clinical Impression(s) / ED Diagnoses Final diagnoses:  Hypertension, unspecified type    Rx / DC Orders ED Discharge Orders          Ordered    hydrALAZINE (APRESOLINE) 10 MG tablet  3 times daily PRN        05/27/22 2227              Jimmie Molly, MD 05/27/22 2304    Tegeler, Gwenyth Allegra, MD 05/29/22 1535

## 2022-05-27 NOTE — ED Provider Triage Note (Signed)
Emergency Medicine Provider Triage Evaluation Note  Daniel Herrera , a 82 y.o. male  was evaluated in triage.  Pt complains of elevated blood pressure.  History of NSTEMI.  Patient states yesterday his blood pressure was in the 200s over 100s.  Patient takes metoprolol for irregular heartbeat and aspirin but denied any blood thinners.  Patient is not on any other blood pressure medications.  Patient denied chest pain, shortness of breath, vision changes, headaches, new onset weakness/changes in sensation, neck pain, abdominal pain, nausea/vomiting, fever, cough  Review of Systems  Positive: See HPI Negative: See HPI  Physical Exam  BP (!) 174/105 (BP Location: Right Arm)   Pulse 74   Temp 98.6 F (37 C) (Oral)   Resp 18   Ht '6\' 1"'$  (1.854 m)   Wt 87.5 kg   SpO2 100%   BMI 25.46 kg/m  Gen:   Awake, no distress   Resp:  Normal effort  MSK:   Moves extremities without difficulty  Other:  Heart rate sounded regular with regular rate, 2+ bilateral radial pulse with regular rate, sensation intact both hands, 5 out of 5 bilateral grip strength, eyes PERRL, full active range of motion neck, lungs clear to auscultation bilaterally  Medical Decision Making  Medically screening exam initiated at 6:27 PM.  Appropriate orders placed.  Daniel Herrera was informed that the remainder of the evaluation will be completed by another provider, this initial triage assessment does not replace that evaluation, and the importance of remaining in the ED until their evaluation is complete.  Workup initiated, suspect asymptomatic hypertension, patient stable at this time   Daniel Herrera 05/27/22 1831

## 2022-05-29 ENCOUNTER — Encounter: Payer: Self-pay | Admitting: Cardiology

## 2022-05-29 NOTE — Telephone Encounter (Signed)
error 

## 2022-06-01 DIAGNOSIS — I10 Essential (primary) hypertension: Secondary | ICD-10-CM | POA: Diagnosis not present

## 2022-06-01 DIAGNOSIS — G4700 Insomnia, unspecified: Secondary | ICD-10-CM | POA: Diagnosis not present

## 2022-06-01 DIAGNOSIS — F411 Generalized anxiety disorder: Secondary | ICD-10-CM | POA: Diagnosis not present

## 2022-06-02 ENCOUNTER — Other Ambulatory Visit: Payer: Self-pay

## 2022-06-02 ENCOUNTER — Telehealth: Payer: Self-pay | Admitting: *Deleted

## 2022-06-02 NOTE — Telephone Encounter (Signed)
     Patient  visit on 05/27/2022  at [facility] ed  was for treatment   Have you been able to follow up with your primary care physician? Went to medical dr today and will see cardiologist on Tuesday patient good with medicine and transportation   The patient was able to obtain any needed medicine or equipment.  Are there diet recommendations that you are having difficulty following?  Patient expresses understanding of discharge instructions and education provided has no other needs at this time.    [facility] [phone] [address] , [place] [address] Email : [email or web address]

## 2022-06-06 ENCOUNTER — Ambulatory Visit: Payer: Medicare Other | Attending: Cardiology | Admitting: Cardiology

## 2022-06-06 ENCOUNTER — Encounter: Payer: Self-pay | Admitting: Cardiology

## 2022-06-06 VITALS — BP 138/68 | HR 84 | Ht 73.0 in | Wt 198.8 lb

## 2022-06-06 DIAGNOSIS — E118 Type 2 diabetes mellitus with unspecified complications: Secondary | ICD-10-CM | POA: Insufficient documentation

## 2022-06-06 DIAGNOSIS — Z951 Presence of aortocoronary bypass graft: Secondary | ICD-10-CM | POA: Diagnosis not present

## 2022-06-06 DIAGNOSIS — I2510 Atherosclerotic heart disease of native coronary artery without angina pectoris: Secondary | ICD-10-CM | POA: Insufficient documentation

## 2022-06-06 DIAGNOSIS — I10 Essential (primary) hypertension: Secondary | ICD-10-CM | POA: Insufficient documentation

## 2022-06-06 DIAGNOSIS — E785 Hyperlipidemia, unspecified: Secondary | ICD-10-CM | POA: Diagnosis not present

## 2022-06-06 MED ORDER — METOPROLOL SUCCINATE ER 25 MG PO TB24: 25.0000 mg | ORAL_TABLET | Freq: Two times a day (BID) | ORAL | 3 refills | Status: DC

## 2022-06-06 MED ORDER — AMLODIPINE BESYLATE 2.5 MG PO TABS: 2.5000 mg | ORAL_TABLET | Freq: Every day | ORAL | 3 refills | Status: DC

## 2022-06-06 NOTE — Addendum Note (Signed)
Addended by: Truddie Hidden on: 06/06/2022 04:43 PM   Modules accepted: Orders

## 2022-06-06 NOTE — Progress Notes (Signed)
Cardiology Office Note:    Date:  06/06/2022   ID:  Daniel Herrera, DOB 24-Aug-1940, MRN [id number]  PCP:  [name], NP  Cardiologist:  Jenean Lindau, MD   Referring MD: [name], NP    ASSESSMENT:    1. Coronary artery disease involving native coronary artery of native heart without angina pectoris   2. Essential hypertension   3. Type 2 diabetes mellitus with unspecified complications ([place])   4. S/P CABG x 3   5. Dyslipidemia, goal LDL below 70    PLAN:    In order of problems listed above:  Coronary artery disease: Secondary prevention stressed with the patient.  Importance of compliance with diet stressed he vocalized understanding.  He was advised to walk on a regular basis with Uncontrolled hypertension: He went to the emergency room.  He was prescribed a medication.  He do not know which 1 it is he will go home and give [place] a call and let us know.  Will advise accordingly.  He will keep a track of his blood pressures at home and bring it to [place] in a week.  I reviewed those records from emergency room visit. Mixed dyslipidemia: On lipid-lowering medications.  Followed by primary care.  Diet was emphasized.  Lifestyle modification urged.  His lipids are not at goal and diet was emphasized.  He does not want to add any medications at this time. Patient will be seen in follow-up appointment in 6 months or earlier if the patient has any concerns    Medication Adjustments/Labs and Tests Ordered: Current medicines are reviewed at length with the patient today.  Concerns regarding medicines are outlined above.  No orders of the defined types were placed in this encounter.  No orders of the defined types were placed in this encounter.    No chief complaint on file.    History of Present Illness:    Daniel Herrera is a 82 y.o. male.  Patient has past medical history of coronary artery disease, essential hypertension, mixed dyslipidemia.  He mentions to me that he  was under stress because he was unable to get his critical angioedema medication from his insurance company.  It has not been approved and he feels better.  No chest pain orthopnea or PND.  He has had history of stroke.  At the time of my evaluation, the patient is alert awake oriented and in no distress.  Past Medical History:  Diagnosis Date   Abnormal EKG 02/21/2015   Acute lower GI bleeding 05/28/2019   Anginal pain ([place]) 2016   Angio-edema    Anxiety    Arthritis    CAD S/P percutaneous coronary angioplasty    NSTEMI treated with LAD PCI with DES x 03 Feb 2015 Myoview low risk Nov 2017   Cataract    Cerebellar stroke ([place]) 06/14/2021   CHF (congestive heart failure) ([place]) 2020   after surgery only   Chronic diarrhea of unknown origin    CKD (chronic kidney disease) stage 3, GFR 30-59 ml/min 12/21/2017   Colitis 05/23/2015   Coronary artery disease 05/28/2019   Diverticular hemorrhage    Diverticulosis    Dyslipidemia, goal LDL below 70 02/21/2015   hyperlipidemia    Dysrhythmia    A-fib   Enterotoxigenic Escherichia coli infection 05/24/2015   Essential hypertension 02/21/2015   Geographic tongue    GERD (gastroesophageal reflux disease)    GERD without esophagitis 06/15/2021   GI bleed 12/21/2017  Glossitis, benign migratory 12/04/2018   Gout    Hematochezia 12/21/2017   Admitted Sept 2019 with lower GI bleeding felt to be secondary to hemorrhoids   Hereditary angioedema type 2 ([place]) 07/28/2017   High cholesterol    History of kidney stones    History of non-ST elevation myocardial infarction (NSTEMI) 02/22/2015   Nov 2016   History of prolonged Q-T interval on ECG    History of prostate cancer    Hoarseness 12/04/2018   Hx of Clostridium difficile infection    Hx of umbilical hernia repair    Hypertension    Hypothyroidism    Hypothyroidism, unspecified 06/15/2021   ILD (interstitial lung disease) ([place]) 12/17/2013   ?related to chicken farm exposure or  aspiration pneumonitis from GERD Previously attributed to acute interstitial pneumonitis but based on HRCT this is likely IPF, "probable UIP"   Interstitial lung disease ([place])    Lower GI bleed 06/03/2019   Malignant neoplasm of prostate ([place]) 08/07/2014   Myocardial infarction ([place]) 2016   NSTEMI (non-ST elevated myocardial infarction) ([place]) 05/21/2018   Open metatarsal fracture 09/02/2020   PAF (paroxysmal atrial fibrillation) ([place]) 06/13/2018   Post CABG- discharged on Amiodarone, he will probably not need this long term   Palpitations 03/15/2015   palpitations    Peripheral vascular disease (H[facility])    vericose veins   Personal history of digestive disease    gastric ulcer   Pre-diabetes    Pre-operative clearance 01/02/2018   Prostate cancer ([place])    Prostate enlargement    S/P CABG x 3 05/28/2018    LIMA-LAD, RIMA-RCA, and Lt radial to OM.   Skin cancer    basal cell carcinoma   STEC (Shiga toxin-producing Escherichia coli) infection 05/24/2015   Stroke (cerebrum) ([place]) 06/15/2021   Tachycardia determined by examination of pulse    Thyroid disease    Type 2 diabetes mellitus with unspecified complications ([place])    Type 2 diabetes mellitus without complications ([place]) [id number]   Urticaria     Past Surgical History:  Procedure Laterality Date   APPENDECTOMY     BACK SURGERY     CARDIAC CATHETERIZATION N/A 02/21/2015   Procedure: Left Heart Cath;  Surgeon: Lorretta Harp, MD;  Location: [facility];  Service: Cardiovascular;  Laterality: N/A;   CATARACT EXTRACTION, BILATERAL     CORONARY ARTERY BYPASS GRAFT N/A 05/24/2018   Procedure: CORONARY ARTERY BYPASS GRAFTING (CABG), ON PUMP, TIMES THREE, USING BILATERAL INTERNAL MAMMARY ARTERY AND HARVESTED LEFT RADIAL ARTERY;  Surgeon: Gaye Pollack, MD;  Location: [facility];  Service: Open Heart Surgery;  Laterality: N/A;   CYSTOSCOPY WITH LITHOLAPAXY N/A 07/26/2020   Procedure: CYSTOSCOPY WITH LITHOLAPAXY WITH FULGERATION;   Surgeon: Raynelle Bring, MD;  Location: [facility];  Service: Urology;  Laterality: N/A;   HOLMIUM LASER APPLICATION N/A [id number]   Procedure: HOLMIUM LASER APPLICATION;  Surgeon: Raynelle Bring, MD;  Location: [facility];  Service: Urology;  Laterality: N/A;   I & D EXTREMITY Right 09/02/2020   Procedure: IRRIGATION AND DEBRIDEMENT RIGHT 2ND AND 3RD METATARSAL;  Surgeon: Wylene Simmer, MD;  Location: [facility];  Service: Orthopedics;  Laterality: Right;   LEFT HEART CATH AND CORONARY ANGIOGRAPHY N/A 05/22/2018   Procedure: LEFT HEART CATH AND CORONARY ANGIOGRAPHY;  Surgeon: Jettie Booze, MD;  Location: [facility];  Service: Cardiovascular;  Laterality: N/A;   NASAL SINUS SURGERY     ORIF TOE FRACTURE Right 09/02/2020   Procedure: OPEN REDUCTION  INTERNAL FIXATION (ORIF) OF RIGHT OPEN 2ND AND 3RD METATARSAL (TOE) OPEN FRACTURE;  Surgeon: Wylene Simmer, MD;  Location: [facility];  Service: Orthopedics;  Laterality: Right;   PROSTATE BIOPSY     RADIAL ARTERY HARVEST Left 05/24/2018   Procedure: RADIAL ARTERY HARVEST;  Surgeon: Gaye Pollack, MD;  Location: [facility];  Service: Open Heart Surgery;  Laterality: Left;   right shoulder rotator cuff repair     TEE WITHOUT CARDIOVERSION N/A 05/24/2018   Procedure: TRANSESOPHAGEAL ECHOCARDIOGRAM (TEE);  Surgeon: Gaye Pollack, MD;  Location: [facility];  Service: Open Heart Surgery;  Laterality: N/A;    Current Medications: Current Meds  Medication Sig   aspirin 81 MG EC tablet Take 1 tablet (81 mg total) by mouth daily.   Azelastine HCl 137 MCG/SPRAY SOLN Place 1 spray into both nostrils 2 (two) times daily.   betamethasone valerate ointment (VALISONE) 0.1 % Apply 1 application topically 2 (two) times daily as needed for rash.   busPIRone (BUSPAR) 5 MG tablet Take 5 mg by mouth 3 (three) times daily.   Cyanocobalamin (VITAMIN B-12) 5000 MCG TBDP Take 5,000 mcg by mouth daily.    dextromethorphan-guaiFENesin (MUCINEX DM) 30-600 MG 12hr tablet Take 1 tablet by  mouth in the morning and at bedtime.   EPINEPHRINE 0.3 mg/0.3 mL IJ SOAJ injection INJECT AS DIRECTED FOR LIFE-THREATHENING ALLERGIC REACTION   fluoruracil (CARAC) 0.5 % cream Apply to skin cancer for 1-7 days during 'flare up' as directed.   fluticasone (FLONASE) 50 MCG/ACT nasal spray Place 1 spray into both nostrils 2 (two) times daily.   glipiZIDE (GLUCOTROL XL) 2.5 MG 24 hr tablet Take 2.5 mg by mouth every morning.   icatibant Acetate (FIRAZYR) 30 MG/3ML injection Inject 3 mLs (30 mg total) into the skin as needed.   levothyroxine (SYNTHROID) 100 MCG tablet Take 100 mcg by mouth daily before breakfast.   magic mouthwash SOLN Take 15 mLs by mouth 3 (three) times daily as needed for mouth pain.   metoprolol succinate (TOPROL-XL) 25 MG 24 hr tablet Take 25 mg by mouth daily.   nabumetone (RELAFEN) 750 MG tablet Take 750 mg by mouth 2 (two) times daily.   nitroGLYCERIN (NITROSTAT) 0.4 MG SL tablet Place 0.4 mg under the tongue every 5 (five) minutes as needed for chest pain.   OVER THE COUNTER MEDICATION Take 1 capsule by mouth in the morning and at bedtime. Health Plus/Super Colon Cleanse   pantoprazole (PROTONIX) 40 MG tablet Take 40 mg by mouth 2 (two) times daily.   Pitavastatin Calcium (LIVALO) 4 MG TABS Take 4 mg by mouth every evening.   Probiotic Product (PROBIOTIC DAILY PO) Take 1 capsule by mouth daily.   TAKHZYRO 300 MG/2ML SOSY INJECT '300MG'$  SUBCUTANEOUSLY EVERY 2 WEEKS   temazepam (RESTORIL) 15 MG capsule Take 15 mg by mouth at bedtime.     Allergies:   Lisinopril, Oxycodone, Tape, Benadryl [diphenhydramine hcl], Darvocet [propoxyphene n-acetaminophen], Darvon [propoxyphene hcl], and Tylenol [acetaminophen]   Social History   Socioeconomic History   Marital status: Married    Spouse name: Not on file   Number of children: Not on file   Years of education: Not on file   Highest education level: Not on file  Occupational History   Occupation: retired  Tobacco Use   Smoking  status: Never   Smokeless tobacco: Never  Vaping Use   Vaping Use: Never used  Substance and Sexual Activity   Alcohol use: Never   Drug use:  No   Sexual activity: Not on file  Other Topics Concern   Not on file  Social History Narrative   Not on file   Social Determinants of Health   Financial Resource Strain: Not on file  Food Insecurity: No Food Insecurity (06/02/2019)   Hunger Vital Sign    Worried About Running Out of Food in the Last Year: Never true    Ran Out of Food in the Last Year: Never true  Transportation Needs: No Transportation Needs (06/02/2019)   PRAPARE - Hydrologist (Medical): No    Lack of Transportation (Non-Medical): No  Physical Activity: Not on file  Stress: Not on file  Social Connections: Not on file     Family History: The patient's family history includes Cancer in his father, mother, and sister; Stomach cancer in his mother. There is no history of Sleep apnea.  ROS:   Please see the history of present illness.    All other systems reviewed and are negative.  EKGs/Labs/Other Studies Reviewed:    The following studies were reviewed today: I discussed findings with the patient at length.   Recent Labs: 07/16/2021: Magnesium 2.1 05/27/2022: ALT 22; BUN 18; Creatinine, Ser 1.59; Hemoglobin 14.8; Platelets 166; Potassium 3.5; Sodium 134; TSH 2.637  Recent Lipid Panel    Component Value Date/Time   CHOL 156 06/15/2021 0308   CHOL 183 05/23/2019 0826   TRIG 180 (H) 06/15/2021 0308   HDL 30 (L) 06/15/2021 0308   HDL 44 05/23/2019 0826   CHOLHDL 5.2 06/15/2021 0308   VLDL 36 06/15/2021 0308   LDLCALC 90 06/15/2021 0308   LDLCALC 108 (H) 05/23/2019 0826    Physical Exam:    VS:  BP 138/68   Pulse 84   Ht '6\' 1"'$  (1.854 m)   Wt 198 lb 12.8 oz (90.2 kg)   SpO2 96%   BMI 26.23 kg/m     Wt Readings from Last 3 Encounters:  06/06/22 198 lb 12.8 oz (90.2 kg)  05/27/22 193 lb (87.5 kg)  12/01/21 200 lb 9.6 oz (91  kg)     GEN: Patient is in no acute distress HEENT: Normal NECK: No JVD; No carotid bruits LYMPHATICS: No lymphadenopathy CARDIAC: Hear sounds regular, 2/6 systolic murmur at the apex. RESPIRATORY:  Clear to auscultation without rales, wheezing or rhonchi  ABDOMEN: Soft, non-tender, non-distended MUSCULOSKELETAL:  No edema; No deformity  SKIN: Warm and dry NEUROLOGIC:  Alert and oriented x 3 PSYCHIATRIC:  Normal affect   Signed, Jenean Lindau, MD  06/06/2022 3:42 PM    [facility]

## 2022-06-06 NOTE — Patient Instructions (Signed)

## 2022-06-13 DIAGNOSIS — J309 Allergic rhinitis, unspecified: Secondary | ICD-10-CM | POA: Diagnosis not present

## 2022-06-13 DIAGNOSIS — F411 Generalized anxiety disorder: Secondary | ICD-10-CM | POA: Diagnosis not present

## 2022-06-13 DIAGNOSIS — I10 Essential (primary) hypertension: Secondary | ICD-10-CM | POA: Diagnosis not present

## 2022-06-13 DIAGNOSIS — G4700 Insomnia, unspecified: Secondary | ICD-10-CM | POA: Diagnosis not present

## 2022-07-04 ENCOUNTER — Ambulatory Visit (INDEPENDENT_AMBULATORY_CARE_PROVIDER_SITE_OTHER): Payer: Medicare Other | Admitting: Pulmonary Disease

## 2022-07-04 ENCOUNTER — Encounter (HOSPITAL_BASED_OUTPATIENT_CLINIC_OR_DEPARTMENT_OTHER): Payer: Self-pay | Admitting: Pulmonary Disease

## 2022-07-04 VITALS — BP 152/76 | HR 70 | Temp 97.7°F | Ht 73.0 in | Wt 187.0 lb

## 2022-07-04 DIAGNOSIS — J849 Interstitial pulmonary disease, unspecified: Secondary | ICD-10-CM | POA: Diagnosis not present

## 2022-07-04 NOTE — Patient Instructions (Signed)
X Ambulatory sat  X HRCT chest in 6 months @ [facility]

## 2022-07-04 NOTE — Progress Notes (Signed)
   Subjective:    Patient ID: Daniel Herrera, male    DOB: 1941/02/13, 82 y.o.   MRN: [id number]  HPI  82 yo never smoker for FU of ILD/probable UIP   PMH- prostate CA, C diff colitis  SVTs that stop with vagal maneuvers,   Recurrent angioedema - on lanadelumab which is a monoclonal antibody to plasma kallilrein/bradykinin CABG 06/2018 ,MI 06/2021 and cryptogenic stroke in the cerebellum  chronic hoarseness for many years, ENT evaluation  negative  He presented in 2015 with dyspnea for a few months, PFTs were normal except for mildly decreased DLCO .  CT chest at [facility] showed mild interstitial prominence. This was attributed to chicken farm exposure or GERD related pneumonitis but has shown gradual progression to "probable UIP" pattern.  We have discussed antifibrotic's on his past visits he has decided against starting.  He had an uneventful winter until 3 months ago when his wife and him caught a cold and he had severe cough, increased Mucinex twice daily eventually cough is getting better now.  Denies yellow sputum production or fevers. He feels breathing is at baseline.  He is his usual jocular self. Blood pressure slight high today  Significant tests/ events reviewed  HRCT chest 12/2021 > unchanged ILD, probable UIP HRCT 09/2020 >> 'probable UIP' worse compared to 2015   HRCT chest 05/2019 -probable UIP, no definite honeycombing   CT chest without contrast 11/2013  showed interstitial prominence particularly apical with mild bronchiectasis, small patchy infiltrate was noted in the right lower lobe with a tiny effusion. ESR ,CCP neg   PFTs 09/2019 FVC stable at 94%, DLCO stable at 16.6/62% compared to 15.6 and 58% in 2020   PFTs 02/2014 - nml FEV1/ FVC & ratio, TLC 81%, DLCO 65%  Review of Systems neg for any significant sore throat, dysphagia, itching, sneezing, nasal congestion or excess/ purulent secretions, fever, chills, sweats, unintended wt loss, pleuritic or exertional  cp, hempoptysis, orthopnea pnd or change in chronic leg swelling. Also denies presyncope, palpitations, heartburn, abdominal pain, nausea, vomiting, diarrhea or change in bowel or urinary habits, dysuria,hematuria, rash, arthralgias, visual complaints, headache, numbness weakness or ataxia.     Objective:   Physical Exam  Gen. Pleasant, elderly, well-nourished, in no distress ENT - no thrush, no pallor/icterus,no post nasal drip Neck: No JVD, no thyromegaly, no carotid bruits Lungs: no use of accessory muscles, no dullness to percussion, clear without rales or rhonchi  Cardiovascular: Rhythm regular, heart sounds  normal, no murmurs or gallops, no peripheral edema Musculoskeletal: No deformities, no cyanosis or clubbing        Assessment & Plan:

## 2022-07-04 NOTE — Assessment & Plan Note (Signed)
Likely IPF given probable UIP pattern on HRCT.  We will follow-up with annual high-resolution CT chest in September 2024.  He would like to hold off on PFTs.  He does not desaturate on exertion today which is encouraging. IPF appears to be stable.  I once again brought up antifibrotic's and he is just not interested.  He is more concerned about quality of his remaining life

## 2022-07-16 ENCOUNTER — Other Ambulatory Visit: Payer: Self-pay | Admitting: Allergy and Immunology

## 2022-07-17 ENCOUNTER — Ambulatory Visit: Payer: Medicare Other | Admitting: Cardiology

## 2022-07-17 ENCOUNTER — Ambulatory Visit (INDEPENDENT_AMBULATORY_CARE_PROVIDER_SITE_OTHER): Payer: Medicare Other | Admitting: Allergy and Immunology

## 2022-07-17 ENCOUNTER — Encounter: Payer: Self-pay | Admitting: Allergy and Immunology

## 2022-07-17 VITALS — BP 150/82 | HR 64 | Resp 14 | Ht 70.0 in | Wt 193.0 lb

## 2022-07-17 DIAGNOSIS — M3500 Sicca syndrome, unspecified: Secondary | ICD-10-CM

## 2022-07-17 DIAGNOSIS — K219 Gastro-esophageal reflux disease without esophagitis: Secondary | ICD-10-CM

## 2022-07-17 DIAGNOSIS — J3089 Other allergic rhinitis: Secondary | ICD-10-CM | POA: Diagnosis not present

## 2022-07-17 DIAGNOSIS — I10 Essential (primary) hypertension: Secondary | ICD-10-CM | POA: Diagnosis not present

## 2022-07-17 DIAGNOSIS — D841 Defects in the complement system: Secondary | ICD-10-CM

## 2022-07-17 MED ORDER — LOSARTAN POTASSIUM 25 MG PO TABS: 25.0000 mg | ORAL_TABLET | Freq: Every day | ORAL | 3 refills | Status: DC

## 2022-07-17 NOTE — Patient Instructions (Addendum)
  1.  Continue Takhzryo every 14 days.   2.  Continue to Treat and prevent inflammation:    A.  Flonase - 1-2 sprays each nostril once a day  3.  Continue to Treat and prevent reflux:   A.  pantoprazole 40 mg twice a day  4.  If needed:   A.  Nasal saline spray  B.  OTC Mucinex DM - 2 tablets twice a day  C.  Firazyr injection for swelling reaction  D.  Systane eye drops  5. Start treatment for blood pressure: losartan 25 mg - 1 time per day. Measure blood pressure. Follow up with [facility] or Cardiology. Dose can be increased.   6.  Return to clinic in 6 months or earlier if problem

## 2022-07-17 NOTE — Progress Notes (Unsigned)
[place] - [place] - [place] - [place] - [place]   Follow-up Note  Referring Provider: Hurshel Party, NP Primary Provider: Hurshel Party, NP Date of Office Visit: 07/17/2022  Subjective:   Daniel Herrera (DOB: Jun 18, 1940) is a 82 y.o. male who [facility] in re-evaluation of the following:  HPI: Jazon returns to this clinic in reevaluation of type II hereditary angioedema treated with Desmond Lope, allergic rhinitis, LPR, sicca syndrome, and interstitial lung disease.  I last saw him in this clinic 05 January 2022.  He has not had any swelling episodes while using Takhzyro.  He has been using a nasal steroid which is working quite well for his rhinitis.  He continues on therapy for reflux which is working quite well.  He is following up with his pulmonologist regarding interstitial lung disease.  He has had some problems with his blood pressure.  I cannot really tell exactly what is going on but it sounds as though he might of developed some side effects from the use of amlodipine and metoprolol and now is not using anything.  There has been some logistical issue and getting him to communicate with his cardiologist or to have his cardiologist communicate with him.  He has obtained the flu vaccine, RSV vaccine, COVID-vaccine.  Allergies as of 07/17/2022       Reactions   Lisinopril Anaphylaxis, Swelling, Other (See Comments)   Face, lips, and throat swell   Amlodipine Other (See Comments)   Heart pounding, head splitting, ankles broke out    Oxycodone Other (See Comments)   Hallucination   Tape Other (See Comments)   PLASTIC TAPE TEARS OFF THE SKIN- Please use an alternative!!   Benadryl [diphenhydramine Hcl] Swelling, Other (See Comments)   Facial swelling from high dose (tolerates Advil PM as needed)   Darvocet [propoxyphene N-acetaminophen] Other (See Comments)   Hallucinations   Darvon [propoxyphene Hcl] Other (See Comments)    Hallucination   Tylenol [acetaminophen] Swelling, Other (See Comments)   Foot swelling        Medication List    aspirin EC 81 MG tablet Take 1 tablet (81 mg total) by mouth daily.   Azelastine HCl 137 MCG/SPRAY Soln Place 1 spray into both nostrils 2 (two) times daily.   betamethasone valerate ointment 0.1 % Commonly known as: VALISONE Apply 1 application topically 2 (two) times daily as needed for rash.   busPIRone 5 MG tablet Commonly known as: BUSPAR Take 5 mg by mouth 3 (three) times daily.   dextromethorphan-guaiFENesin 30-600 MG 12hr tablet Commonly known as: MUCINEX DM Take 1 tablet by mouth in the morning and at bedtime.   EPINEPHrine 0.3 mg/0.3 mL Soaj injection Commonly known as: EPI-PEN INJECT AS DIRECTED FOR LIFE-THREATHENING ALLERGIC REACTION   fluoruracil 0.5 % cream Commonly known as: CARAC Apply to skin cancer for 1-7 days during 'flare up' as directed.   fluticasone 50 MCG/ACT nasal spray Commonly known as: FLONASE Place 1 spray into both nostrils 2 (two) times daily.   glipiZIDE 2.5 MG 24 hr tablet Commonly known as: GLUCOTROL XL Take 2.5 mg by mouth every morning.   hydrALAZINE 10 MG tablet Commonly known as: APRESOLINE Take 1 tablet (10 mg total) by mouth 3 (three) times daily as needed for up to 10 doses. Please take for systolic blood pressure >180   icatibant Acetate 30 MG/3ML injection Commonly known as: FIRAZYR Inject 3 mLs (30 mg total) into the skin  as needed.   levothyroxine 100 MCG tablet Commonly known as: SYNTHROID Take 100 mcg by mouth daily before breakfast.   Livalo 4 MG Tabs Generic drug: Pitavastatin Calcium Take 4 mg by mouth every evening.   losartan 25 MG tablet Commonly known as: COZAAR Take 1 tablet (25 mg total) by mouth daily. Started by: Jessica Priest, MD   magic mouthwash Soln Take 15 mLs by mouth 3 (three) times daily as needed for mouth pain.   metoprolol succinate 25 MG 24 hr tablet Commonly known  as: TOPROL-XL Take 1 tablet (25 mg total) by mouth 2 (two) times daily.   nabumetone 750 MG tablet Commonly known as: RELAFEN Take 750 mg by mouth 2 (two) times daily.   nitroGLYCERIN 0.4 MG SL tablet Commonly known as: NITROSTAT Place 0.4 mg under the tongue every 5 (five) minutes as needed for chest pain.   OVER THE COUNTER MEDICATION Take 1 capsule by mouth in the morning and at bedtime. Health Plus/Super Colon Cleanse   pantoprazole 40 MG tablet Commonly known as: PROTONIX Take 40 mg by mouth 2 (two) times daily.   PROBIOTIC DAILY PO Take 1 capsule by mouth daily.   Takhzyro 300 MG/2ML Sosy Generic drug: Lanadelumab-flyo INJECT 300MG  SUBCUTANEOUSLY  EVERY 2 WEEKS   temazepam 15 MG capsule Commonly known as: RESTORIL Take 15 mg by mouth at bedtime.   Vitamin B-12 5000 MCG Tbdp Take 5,000 mcg by mouth daily.    Past Medical History:  Diagnosis Date   Abnormal EKG 02/21/2015   Acute lower GI bleeding 05/28/2019   Anginal pain 2016   Angio-edema    Anxiety    Arthritis    CAD S/P percutaneous coronary angioplasty    NSTEMI treated with LAD PCI with DES x 03 Feb 2015 Myoview low risk Nov 2017   Cataract    Cerebellar stroke 06/14/2021   CHF (congestive heart failure) 2020   after surgery only   Chronic diarrhea of unknown origin    CKD (chronic kidney disease) stage 3, GFR 30-59 ml/min 12/21/2017   Colitis 05/23/2015   Coronary artery disease 05/28/2019   Diverticular hemorrhage    Diverticulosis    Dyslipidemia, goal LDL below 70 02/21/2015   hyperlipidemia    Dysrhythmia    A-fib   Enterotoxigenic Escherichia coli infection 05/24/2015   Essential hypertension 02/21/2015   Geographic tongue    GERD (gastroesophageal reflux disease)    GERD without esophagitis 06/15/2021   GI bleed 12/21/2017   Glossitis, benign migratory 12/04/2018   Gout    Hematochezia 12/21/2017   Admitted Sept 2019 with lower GI bleeding felt to be secondary to hemorrhoids    Hereditary angioedema type 2 07/28/2017   High cholesterol    History of kidney stones    History of non-ST elevation myocardial infarction (NSTEMI) 02/22/2015   Nov 2016   History of prolonged Q-T interval on ECG    History of prostate cancer    Hoarseness 12/04/2018   Hx of Clostridium difficile infection    Hx of umbilical hernia repair    Hypertension    Hypothyroidism    Hypothyroidism, unspecified 06/15/2021   ILD (interstitial lung disease) 12/17/2013   ?related to chicken farm exposure or aspiration pneumonitis from GERD Previously attributed to acute interstitial pneumonitis but based on HRCT this is likely IPF, "probable UIP"   Interstitial lung disease    Lower GI bleed 06/03/2019   Malignant neoplasm of prostate 08/07/2014   Myocardial infarction 2016  NSTEMI (non-ST elevated myocardial infarction) 05/21/2018   Open metatarsal fracture 09/02/2020   PAF (paroxysmal atrial fibrillation) 06/13/2018   Post CABG- discharged on Amiodarone, he will probably not need this long term   Palpitations 03/15/2015   palpitations    Peripheral vascular disease    vericose veins   Personal history of digestive disease    gastric ulcer   Pre-diabetes    Pre-operative clearance 01/02/2018   Prostate cancer    Prostate enlargement    S/P CABG x 3 05/28/2018    LIMA-LAD, RIMA-RCA, and Lt radial to OM.   Skin cancer    basal cell carcinoma   STEC (Shiga toxin-producing Escherichia coli) infection 05/24/2015   Stroke (cerebrum) 06/15/2021   Tachycardia determined by examination of pulse    Thyroid disease    Type 2 diabetes mellitus with unspecified complications    Type 2 diabetes mellitus without complications 06/15/2021   Urticaria     Past Surgical History:  Procedure Laterality Date   APPENDECTOMY     BACK SURGERY     CARDIAC CATHETERIZATION N/A 02/21/2015   Procedure: Left Heart Cath;  Surgeon: Runell Gess, MD;  Location: [facility] INVASIVE CV LAB;  Service:  Cardiovascular;  Laterality: N/A;   CATARACT EXTRACTION, BILATERAL     CORONARY ARTERY BYPASS GRAFT N/A 05/24/2018   Procedure: CORONARY ARTERY BYPASS GRAFTING (CABG), ON PUMP, TIMES THREE, USING BILATERAL INTERNAL MAMMARY ARTERY AND HARVESTED LEFT RADIAL ARTERY;  Surgeon: Alleen Borne, MD;  Location: [facility] OR;  Service: Open Heart Surgery;  Laterality: N/A;   CYSTOSCOPY WITH LITHOLAPAXY N/A 07/26/2020   Procedure: CYSTOSCOPY WITH LITHOLAPAXY WITH FULGERATION;  Surgeon: Heloise Purpura, MD;  Location: [facility] ORS;  Service: Urology;  Laterality: N/A;   HOLMIUM LASER APPLICATION N/A 07/26/2020   Procedure: HOLMIUM LASER APPLICATION;  Surgeon: Heloise Purpura, MD;  Location: [facility] ORS;  Service: Urology;  Laterality: N/A;   I & D EXTREMITY Right 09/02/2020   Procedure: IRRIGATION AND DEBRIDEMENT RIGHT 2ND AND 3RD METATARSAL;  Surgeon: Toni Arthurs, MD;  Location: [facility] OR;  Service: Orthopedics;  Laterality: Right;   LEFT HEART CATH AND CORONARY ANGIOGRAPHY N/A 05/22/2018   Procedure: LEFT HEART CATH AND CORONARY ANGIOGRAPHY;  Surgeon: Corky Crafts, MD;  Location: [facility] INVASIVE CV LAB;  Service: Cardiovascular;  Laterality: N/A;   NASAL SINUS SURGERY     ORIF TOE FRACTURE Right 09/02/2020   Procedure: OPEN REDUCTION INTERNAL FIXATION (ORIF) OF RIGHT OPEN 2ND AND 3RD METATARSAL (TOE) OPEN FRACTURE;  Surgeon: Toni Arthurs, MD;  Location: [facility] OR;  Service: Orthopedics;  Laterality: Right;   PROSTATE BIOPSY     RADIAL ARTERY HARVEST Left 05/24/2018   Procedure: RADIAL ARTERY HARVEST;  Surgeon: Alleen Borne, MD;  Location: [facility] OR;  Service: Open Heart Surgery;  Laterality: Left;   right shoulder rotator cuff repair     TEE WITHOUT CARDIOVERSION N/A 05/24/2018   Procedure: TRANSESOPHAGEAL ECHOCARDIOGRAM (TEE);  Surgeon: Alleen Borne, MD;  Location[facility];  Service: Open Heart Surgery;  Laterality: N/A;    Review of systems negative except as noted in HPI / PMHx or noted below:  Review of Systems   Constitutional: Negative.   HENT: Negative.    Eyes: Negative.   Respiratory: Negative.    Cardiovascular: Negative.   Gastrointestinal: Negative.   Genitourinary: Negative.   Musculoskeletal: Negative.   Skin: Negative.   Neurological: Negative.   Endo/Heme/Allergies: Negative.   Psychiatric/Behavioral: Negative.       Objective:  Vitals:   07/17/22 1107 07/17/22 1108  BP: (!) 160/86 (!) 150/82  Pulse:    Resp:    SpO2:     Height: 5\' 10"  (177.8 cm)  Weight: 193 lb (87.5 kg)   Physical Exam Constitutional:      Appearance: He is not diaphoretic.  HENT:     Head: Normocephalic.     Right Ear: Tympanic membrane, ear canal and external ear normal.     Left Ear: Tympanic membrane, ear canal and external ear normal.     Nose: Nose normal. No mucosal edema or rhinorrhea.     Mouth/Throat:     Pharynx: Uvula midline. No oropharyngeal exudate.  Eyes:     Conjunctiva/sclera: Conjunctivae normal.  Neck:     Thyroid: No thyromegaly.     Trachea: Trachea normal. No tracheal tenderness or tracheal deviation.  Cardiovascular:     Rate and Rhythm: Normal rate and regular rhythm.     Heart sounds: Normal heart sounds, S1 normal and S2 normal. No murmur heard. Pulmonary:     Effort: No respiratory distress.     Breath sounds: Normal breath sounds. No stridor. No wheezing or rales.  Lymphadenopathy:     Head:     Right side of head: No tonsillar adenopathy.     Left side of head: No tonsillar adenopathy.     Cervical: No cervical adenopathy.  Skin:    Findings: No erythema or rash.     Nails: There is no clubbing.  Neurological:     Mental Status: He is alert.     Diagnostics: none  Assessment and Plan:   1. Hereditary angioedema type 2   2. Perennial allergic rhinitis   3. LPRD (laryngopharyngeal reflux disease)   4. Sicca syndrome   5. Primary hypertension    1.  Continue Takhzryo every 14 days.   2.  Continue to Treat and prevent inflammation:    A.   Flonase - 1-2 sprays each nostril once a day  3.  Continue to Treat and prevent reflux:   A.  pantoprazole 40 mg twice a day  4.  If needed:   A.  Nasal saline spray  B.  OTC Mucinex DM - 2 tablets twice a day  C.  Firazyr injection for swelling reaction  D.  Systane eye drops  5. Start treatment for blood pressure: losartan 25 mg - 1 time per day. Measure blood pressure. Follow up with [facility] or Cardiology. Dose can be increased.   6.  Return to clinic in 6 months or earlier if problem  Quaylon appears to be doing pretty well regarding his hereditary angioedema while using his biologic agent and his rhinitis and reflux appear to be under control.  He is having a problem with his blood pressure and I started him on losartan at just 25 mg and we will see what happens over the course the next several weeks and he can follow-up with his primary care doctor or cardiology regarding further management of this issue.   Laurette Schimke, MD Allergy / Immunology [facility]

## 2022-07-18 ENCOUNTER — Encounter: Payer: Self-pay | Admitting: Allergy and Immunology

## 2022-09-03 ENCOUNTER — Emergency Department (HOSPITAL_COMMUNITY): Payer: Medicare Other

## 2022-09-03 ENCOUNTER — Emergency Department (HOSPITAL_COMMUNITY)
Admission: EM | Admit: 2022-09-03 | Discharge: 2022-09-03 | Disposition: A | Discharge status: Home / Self Care | Payer: Medicare Other | Attending: Emergency Medicine | Admitting: Emergency Medicine

## 2022-09-03 ENCOUNTER — Encounter (HOSPITAL_COMMUNITY): Payer: Self-pay

## 2022-09-03 ENCOUNTER — Other Ambulatory Visit: Payer: Self-pay

## 2022-09-03 DIAGNOSIS — T447X5A Adverse effect of beta-adrenoreceptor antagonists, initial encounter: Secondary | ICD-10-CM | POA: Insufficient documentation

## 2022-09-03 DIAGNOSIS — Z79899 Other long term (current) drug therapy: Secondary | ICD-10-CM | POA: Insufficient documentation

## 2022-09-03 DIAGNOSIS — I2510 Atherosclerotic heart disease of native coronary artery without angina pectoris: Secondary | ICD-10-CM | POA: Insufficient documentation

## 2022-09-03 DIAGNOSIS — R42 Dizziness and giddiness: Secondary | ICD-10-CM | POA: Diagnosis not present

## 2022-09-03 DIAGNOSIS — Z85828 Personal history of other malignant neoplasm of skin: Secondary | ICD-10-CM | POA: Diagnosis not present

## 2022-09-03 DIAGNOSIS — Z8546 Personal history of malignant neoplasm of prostate: Secondary | ICD-10-CM | POA: Insufficient documentation

## 2022-09-03 DIAGNOSIS — R519 Headache, unspecified: Secondary | ICD-10-CM | POA: Diagnosis not present

## 2022-09-03 DIAGNOSIS — N183 Chronic kidney disease, stage 3 unspecified: Secondary | ICD-10-CM | POA: Insufficient documentation

## 2022-09-03 DIAGNOSIS — E039 Hypothyroidism, unspecified: Secondary | ICD-10-CM | POA: Insufficient documentation

## 2022-09-03 DIAGNOSIS — Z951 Presence of aortocoronary bypass graft: Secondary | ICD-10-CM | POA: Diagnosis not present

## 2022-09-03 DIAGNOSIS — I509 Heart failure, unspecified: Secondary | ICD-10-CM | POA: Insufficient documentation

## 2022-09-03 DIAGNOSIS — E1122 Type 2 diabetes mellitus with diabetic chronic kidney disease: Secondary | ICD-10-CM | POA: Insufficient documentation

## 2022-09-03 DIAGNOSIS — T50905A Adverse effect of unspecified drugs, medicaments and biological substances, initial encounter: Secondary | ICD-10-CM

## 2022-09-03 DIAGNOSIS — Z7984 Long term (current) use of oral hypoglycemic drugs: Secondary | ICD-10-CM | POA: Diagnosis not present

## 2022-09-03 DIAGNOSIS — Z7982 Long term (current) use of aspirin: Secondary | ICD-10-CM | POA: Diagnosis not present

## 2022-09-03 DIAGNOSIS — I130 Hypertensive heart and chronic kidney disease with heart failure and stage 1 through stage 4 chronic kidney disease, or unspecified chronic kidney disease: Secondary | ICD-10-CM | POA: Insufficient documentation

## 2022-09-03 LAB — URINALYSIS, ROUTINE W REFLEX MICROSCOPIC
Bilirubin Urine: NEGATIVE
Glucose, UA: NEGATIVE mg/dL
Hgb urine dipstick: NEGATIVE
Ketones, ur: NEGATIVE mg/dL
Leukocytes,Ua: NEGATIVE
Nitrite: NEGATIVE
Protein, ur: NEGATIVE mg/dL
Specific Gravity, Urine: 1.005 (ref 1.005–1.030)
pH: 5 (ref 5.0–8.0)

## 2022-09-03 LAB — BRAIN NATRIURETIC PEPTIDE: B Natriuretic Peptide: 301 pg/mL — ABNORMAL HIGH (ref 0.0–100.0)

## 2022-09-03 LAB — TROPONIN I (HIGH SENSITIVITY)
Troponin I (High Sensitivity): 10 ng/L (ref <18)
Troponin I (High Sensitivity): 10 ng/L (ref <18)

## 2022-09-03 LAB — BASIC METABOLIC PANEL
Anion gap: 7 (ref 5–15)
BUN: 19 mg/dL (ref 8–23)
CO2: 25 mmol/L (ref 22–32)
Calcium: 9.1 mg/dL (ref 8.9–10.3)
Chloride: 104 mmol/L (ref 98–111)
Creatinine, Ser: 1.67 mg/dL — ABNORMAL HIGH (ref 0.61–1.24)
GFR, Estimated: 40 mL/min — ABNORMAL LOW (ref >60)
Glucose, Bld: 142 mg/dL — ABNORMAL HIGH (ref 70–99)
Potassium: 4 mmol/L (ref 3.5–5.1)
Sodium: 136 mmol/L (ref 135–145)

## 2022-09-03 LAB — MAGNESIUM: Magnesium: 2 mg/dL (ref 1.7–2.4)

## 2022-09-03 LAB — CBC
HCT: 41.5 % (ref 39.0–52.0)
Hemoglobin: 14.2 g/dL (ref 13.0–17.0)
MCH: 32.4 pg (ref 26.0–34.0)
MCHC: 34.2 g/dL (ref 30.0–36.0)
MCV: 94.7 fL (ref 80.0–100.0)
Platelets: 158 10*3/uL (ref 150–400)
RBC: 4.38 MIL/uL (ref 4.22–5.81)
RDW: 12.4 % (ref 11.5–15.5)
WBC: 4.5 10*3/uL (ref 4.0–10.5)
nRBC: 0 % (ref 0.0–0.2)

## 2022-09-03 LAB — CBG MONITORING, ED: Glucose-Capillary: 177 mg/dL — ABNORMAL HIGH (ref 70–99)

## 2022-09-03 LAB — TSH: TSH: 2.61 u[IU]/mL (ref 0.350–4.500)

## 2022-09-03 LAB — T4, FREE: Free T4: 1.49 ng/dL — ABNORMAL HIGH (ref 0.61–1.12)

## 2022-09-03 MED ORDER — SODIUM CHLORIDE 0.9 % IV BOLUS
500.0000 mL | Freq: Once | INTRAVENOUS | Status: AC
  Administered 2022-09-03: 500 mL via INTRAVENOUS

## 2022-09-03 NOTE — Discharge Instructions (Signed)
It was a pleasure caring for you today in the emergency department.  Please return to the emergency department for any worsening or worrisome symptoms.  Please follow-up with your cardiologist further medication management of your blood pressure medications

## 2022-09-03 NOTE — ED Triage Notes (Signed)
Pt arrived POV from home c/o hypertension and dizziness that started this morning when he woke up. Pt states his LKW was 9pm last night. Pt denies any pain.

## 2022-09-03 NOTE — ED Provider Notes (Signed)
[place] EMERGENCY DEPARTMENT AT [facility] Provider Note  CSN: [id number] Arrival date & time: 09/03/22 1015  Chief Complaint(s) Dizziness  HPI Daniel Herrera is a 82 y.o. male with past medical history as below, significant for CAD, CHF, CKD stage 3, HLD, afib, GERD, ILD, T2DM who presents to the ED with complaint of elevated blood pressure, "funny sensation in my head."  Patient reports his blood pressure was elevated this morning, he took his metoprolol and his heart rate was in the 50s and typically his heart rate is greater than 60.  He had no palpitations or chest pain.  No dyspnea.  He had a abnormal sensation in his head that is difficult for him to quantify or characterize.  Possibly a headache but he is not really sure.  No numbness or tingling to extremities.  No gait disturbance that seems abnormal.  No vision or hearing changes.  He does wear hearing aids but feels his hearing is at baseline.  No balance issues or dizziness or lightheadedness.  No syncope or near syncope.  Compliant with home medications.  No change in bowel or bladder function, no change in p.o. intake.  Does report he was recently started on losartan.  He reports his symptoms have improved since the onset which was around 8 AM this morning.  He is very close to his baseline at this time.  Past Medical History Past Medical History:  Diagnosis Date   Abnormal EKG 02/21/2015   Acute lower GI bleeding 05/28/2019   Anginal pain (HCC) 2016   Angio-edema    Anxiety    Arthritis    CAD S/P percutaneous coronary angioplasty    NSTEMI treated with LAD PCI with DES x 03 Feb 2015 Myoview low risk Nov 2017   Cataract    Cerebellar stroke (HCC) 06/14/2021   CHF (congestive heart failure) (HCC) 2020   after surgery only   Chronic diarrhea of unknown origin    CKD (chronic kidney disease) stage 3, GFR 30-59 ml/min 12/21/2017   Colitis 05/23/2015   Coronary artery disease 05/28/2019   Diverticular  hemorrhage    Diverticulosis    Dyslipidemia, goal LDL below 70 02/21/2015   hyperlipidemia    Dysrhythmia    A-fib   Enterotoxigenic Escherichia coli infection 05/24/2015   Essential hypertension 02/21/2015   Geographic tongue    GERD (gastroesophageal reflux disease)    GERD without esophagitis 06/15/2021   GI bleed 12/21/2017   Glossitis, benign migratory 12/04/2018   Gout    Hematochezia 12/21/2017   Admitted Sept 2019 with lower GI bleeding felt to be secondary to hemorrhoids   Hereditary angioedema type 2 (HCC) 07/28/2017   High cholesterol    History of kidney stones    History of non-ST elevation myocardial infarction (NSTEMI) 02/22/2015   Nov 2016   History of prolonged Q-T interval on ECG    History of prostate cancer    Hoarseness 12/04/2018   Hx of Clostridium difficile infection    Hx of umbilical hernia repair    Hypertension    Hypothyroidism    Hypothyroidism, unspecified 06/15/2021   ILD (interstitial lung disease) (HCC) 12/17/2013   ?related to chicken farm exposure or aspiration pneumonitis from GERD Previously attributed to acute interstitial pneumonitis but based on HRCT this is likely IPF, "probable UIP"   Interstitial lung disease (HCC)    Lower GI bleed 06/03/2019   Malignant neoplasm of prostate (HCC) 08/07/2014   Myocardial infarction (HCC) 2016  NSTEMI (non-ST elevated myocardial infarction) (HCC) 05/21/2018   Open metatarsal fracture 09/02/2020   PAF (paroxysmal atrial fibrillation) (HCC) 06/13/2018   Post CABG- discharged on Amiodarone, he will probably not need this long term   Palpitations 03/15/2015   palpitations    Peripheral vascular disease (HCC)    vericose veins   Personal history of digestive disease    gastric ulcer   Pre-diabetes    Pre-operative clearance 01/02/2018   Prostate cancer (HCC)    Prostate enlargement    S/P CABG x 3 05/28/2018    LIMA-LAD, RIMA-RCA, and Lt radial to OM.   Skin cancer    basal cell carcinoma    STEC (Shiga toxin-producing Escherichia coli) infection 05/24/2015   Stroke (cerebrum) (HCC) 06/15/2021   Tachycardia determined by examination of pulse    Thyroid disease    Type 2 diabetes mellitus with unspecified complications (HCC)    Type 2 diabetes mellitus without complications (HCC) 06/15/2021   Urticaria    Patient Active Problem List   Diagnosis Date Noted   Hypothyroidism, unspecified 06/15/2021   GERD without esophagitis 06/15/2021   Type 2 diabetes mellitus without complications (HCC) 06/15/2021   Stroke (cerebrum) (HCC) 06/15/2021   Cerebellar stroke (HCC) 06/14/2021   Open metatarsal fracture 09/02/2020   Dysrhythmia    Geographic tongue    History of kidney stones    Hypothyroidism    Peripheral vascular disease (HCC)    Pre-diabetes    Urticaria    Thyroid disease    Skin cancer    Prostate enlargement    Prostate cancer (HCC)    Personal history of digestive disease    Myocardial infarction (HCC)    Hypertension    Hx of umbilical hernia repair    Hx of Clostridium difficile infection    History of prolonged Q-T interval on ECG    High cholesterol    Gout    GERD (gastroesophageal reflux disease)    Arthritis    Anxiety    Angio-edema    Cataract    Diverticular hemorrhage    Lower GI bleed 06/03/2019   Acute lower GI bleeding 05/28/2019   Coronary artery disease 05/28/2019   Diverticulosis    Glossitis, benign migratory 12/04/2018   Hoarseness 12/04/2018   PAF (paroxysmal atrial fibrillation) (HCC) 06/13/2018   S/P CABG x 3 05/28/2018   NSTEMI (non-ST elevated myocardial infarction) (HCC) 05/21/2018   Tachycardia determined by examination of pulse    CHF (congestive heart failure) (HCC) 2020   Pre-operative clearance 01/02/2018   Hematochezia 12/21/2017   CKD (chronic kidney disease) stage 3, GFR 30-59 ml/min 12/21/2017   GI bleed 12/21/2017   Hereditary angioedema type 2 (HCC) 07/28/2017   Enterotoxigenic Escherichia coli infection  05/24/2015   STEC (Shiga toxin-producing Escherichia coli) infection 05/24/2015   Colitis 05/23/2015   History of prostate cancer    Chronic diarrhea of unknown origin    CAD S/P percutaneous coronary angioplasty    Type 2 diabetes mellitus with unspecified complications (HCC)    Palpitations 03/15/2015   History of non-ST elevation myocardial infarction (NSTEMI) 02/22/2015   Abnormal EKG 02/21/2015   Essential hypertension 02/21/2015   Dyslipidemia, goal LDL below 70 02/21/2015   Malignant neoplasm of prostate (HCC) 08/07/2014   Anginal pain (HCC) 2016   ILD (interstitial lung disease) (HCC) 12/17/2013   Home Medication(s) Prior to Admission medications   Medication Sig Start Date End Date Taking? Authorizing Provider  aspirin 81 MG EC tablet Take 1  tablet (81 mg total) by mouth daily. 07/07/21   Anson Fret, MD  Azelastine HCl 137 MCG/SPRAY SOLN Place 1 spray into both nostrils 2 (two) times daily. 02/05/21   [provider]  betamethasone valerate ointment (VALISONE) 0.1 % Apply 1 application topically 2 (two) times daily as needed for rash. 01/18/21   [provider]  busPIRone (BUSPAR) 5 MG tablet Take 5 mg by mouth 3 (three) times daily. 06/01/22   [provider]  Cyanocobalamin (VITAMIN B-12) 5000 MCG TBDP Take 5,000 mcg by mouth daily.     [provider]  dextromethorphan-guaiFENesin (MUCINEX DM) 30-600 MG 12hr tablet Take 1 tablet by mouth in the morning and at bedtime.    [provider]  EPINEPHRINE 0.3 mg/0.3 mL IJ SOAJ injection INJECT AS DIRECTED FOR LIFE-THREATHENING ALLERGIC REACTION 09/09/21   Kozlow, Alvira Philips, MD  fluoruracil [facility]) 0.5 % cream Apply to skin cancer for 1-7 days during 'flare up' as directed. 03/09/22   Kozlow, Alvira Philips, MD  fluticasone (FLONASE) 50 MCG/ACT nasal spray Place 1 spray into both nostrils 2 (two) times daily.    [provider]  glipiZIDE (GLUCOTROL XL) 2.5 MG 24 hr tablet Take 2.5 mg by  mouth every morning. 10/21/20   [provider]  hydrALAZINE (APRESOLINE) 10 MG tablet Take 1 tablet (10 mg total) by mouth 3 (three) times daily as needed for up to 10 doses. Please take for systolic blood pressure >180 05/27/22   Florene Route, MD  icatibant Acetate [facility]) 30 MG/3ML injection Inject 3 mLs (30 mg total) into the skin as needed. 03/21/22   Kozlow, Alvira Philips, MD  levothyroxine (SYNTHROID) 100 MCG tablet Take 100 mcg by mouth daily before breakfast.    [provider]  losartan (COZAAR) 25 MG tablet Take 1 tablet (25 mg total) by mouth daily. 07/17/22   Kozlow, Alvira Philips, MD  magic mouthwash SOLN Take 15 mLs by mouth 3 (three) times daily as needed for mouth pain.    [provider]  metoprolol succinate (TOPROL-XL) 25 MG 24 hr tablet Take 1 tablet (25 mg total) by mouth 2 (two) times daily. 06/06/22   Revankar, Aundra Dubin, MD  nabumetone (RELAFEN) 750 MG tablet Take 750 mg by mouth 2 (two) times daily. 08/09/19   [provider]  nitroGLYCERIN (NITROSTAT) 0.4 MG SL tablet Place 0.4 mg under the tongue every 5 (five) minutes as needed for chest pain.    [provider]  OVER THE COUNTER MEDICATION Take 1 capsule by mouth in the morning and at bedtime. Health Plus/Super Colon Cleanse    [provider]  pantoprazole (PROTONIX) 40 MG tablet Take 40 mg by mouth 2 (two) times daily.    [provider]  Pitavastatin Calcium (LIVALO) 4 MG TABS Take 4 mg by mouth every evening.    [provider]  Probiotic Product (PROBIOTIC DAILY PO) Take 1 capsule by mouth daily.    [provider]  TAKHZYRO 300 MG/2ML SOSY INJECT 300MG  SUBCUTANEOUSLY  EVERY 2 WEEKS 07/17/22   Kozlow, Alvira Philips, MD  temazepam (RESTORIL) 15 MG capsule Take 15 mg by mouth at bedtime. 06/01/22   [provider]  Past Surgical  History Past Surgical History:  Procedure Laterality Date   APPENDECTOMY     BACK SURGERY     CARDIAC CATHETERIZATION N/A 02/21/2015   Procedure: Left Heart Cath;  Surgeon: Runell Gess, MD;  Location: [facility] INVASIVE CV LAB;  Service: Cardiovascular;  Laterality: N/A;   CATARACT EXTRACTION, BILATERAL     CORONARY ARTERY BYPASS GRAFT N/A 05/24/2018   Procedure: CORONARY ARTERY BYPASS GRAFTING (CABG), ON PUMP, TIMES THREE, USING BILATERAL INTERNAL MAMMARY ARTERY AND HARVESTED LEFT RADIAL ARTERY;  Surgeon: Alleen Borne, MD;  Location: [facility] OR;  Service: Open Heart Surgery;  Laterality: N/A;   CYSTOSCOPY WITH LITHOLAPAXY N/A 07/26/2020   Procedure: CYSTOSCOPY WITH LITHOLAPAXY WITH FULGERATION;  Surgeon: Heloise Purpura, MD;  Location: [facility] ORS;  Service: Urology;  Laterality: N/A;   HOLMIUM LASER APPLICATION N/A 07/26/2020   Procedure: HOLMIUM LASER APPLICATION;  Surgeon: Heloise Purpura, MD;  Location: [facility] ORS;  Service: Urology;  Laterality: N/A;   I & D EXTREMITY Right 09/02/2020   Procedure: IRRIGATION AND DEBRIDEMENT RIGHT 2ND AND 3RD METATARSAL;  Surgeon: Toni Arthurs, MD;  Location: [facility] OR;  Service: Orthopedics;  Laterality: Right;   LEFT HEART CATH AND CORONARY ANGIOGRAPHY N/A 05/22/2018   Procedure: LEFT HEART CATH AND CORONARY ANGIOGRAPHY;  Surgeon: Corky Crafts, MD;  Location: [facility] INVASIVE CV LAB;  Service: Cardiovascular;  Laterality: N/A;   NASAL SINUS SURGERY     ORIF TOE FRACTURE Right 09/02/2020   Procedure: OPEN REDUCTION INTERNAL FIXATION (ORIF) OF RIGHT OPEN 2ND AND 3RD METATARSAL (TOE) OPEN FRACTURE;  Surgeon: Toni Arthurs, MD;  Location: [facility] OR;  Service: Orthopedics;  Laterality: Right;   PROSTATE BIOPSY     RADIAL ARTERY HARVEST Left 05/24/2018   Procedure: RADIAL ARTERY HARVEST;  Surgeon: Alleen Borne, MD;  Location: [facility] OR;  Service: Open Heart Surgery;  Laterality: Left;   right shoulder rotator cuff repair     TEE WITHOUT CARDIOVERSION N/A 05/24/2018   Procedure:  TRANSESOPHAGEAL ECHOCARDIOGRAM (TEE);  Surgeon: Alleen Borne, MD;  Location: [facility] OR;  Service: Open Heart Surgery;  Laterality: N/A;   Family History Family History  Problem Relation Age of Onset   Cancer Mother    Stomach cancer Mother    Cancer Father        prostate   Cancer Sister    Sleep apnea Neg Hx     Social History Social History   Tobacco Use   Smoking status: Never   Smokeless tobacco: Never  Vaping Use   Vaping Use: Never used  Substance Use Topics   Alcohol use: Never   Drug use: No   Allergies Lisinopril, Amlodipine, Oxycodone, Tape, Benadryl [diphenhydramine hcl], Darvocet [propoxyphene n-acetaminophen], Darvon [propoxyphene hcl], and Tylenol [acetaminophen]  Review of Systems Review of Systems  Constitutional:  Negative for chills and fever.  HENT:  Negative for facial swelling and trouble swallowing.   Eyes:  Negative for photophobia and visual disturbance.  Respiratory:  Negative for cough and shortness of breath.   Cardiovascular:  Negative for chest pain and palpitations.  Gastrointestinal:  Negative for abdominal pain, nausea and vomiting.  Endocrine: Negative for polydipsia and polyuria.  Genitourinary:  Negative for difficulty urinating and hematuria.  Musculoskeletal:  Negative for gait problem and joint swelling.  Skin:  Negative for pallor and rash.  Neurological:  Negative for dizziness, syncope, speech difficulty, weakness, light-headedness and numbness.  Psychiatric/Behavioral:  Negative for agitation and confusion.     Physical Exam Vital Signs  I have reviewed the triage vital signs BP (!) 122/90 (BP Location: Right Arm)   Pulse 61   Temp 97.6 F (36.4 C) (Oral)   Resp 14   Ht 6' (1.829 m)   Wt 83 kg   BMI 24.82 kg/m  Physical Exam Vitals and nursing note reviewed.  Constitutional:      General: He is not in acute distress.    Appearance: Normal appearance. He is well-developed. He is not ill-appearing.  HENT:     Head:  Normocephalic and atraumatic. No raccoon eyes, Battle's sign, right periorbital erythema or left periorbital erythema.     Jaw: There is normal jaw occlusion.     Right Ear: Tympanic membrane and external ear normal. There is no impacted cerumen.     Left Ear: Tympanic membrane and external ear normal. There is no impacted cerumen.     Mouth/Throat:     Mouth: Mucous membranes are moist.  Eyes:     General: No scleral icterus.    Extraocular Movements: Extraocular movements intact.     Pupils: Pupils are equal, round, and reactive to light.  Cardiovascular:     Rate and Rhythm: Normal rate and regular rhythm.     Pulses: Normal pulses.     Heart sounds: Normal heart sounds.  Pulmonary:     Effort: Pulmonary effort is normal. No tachypnea, accessory muscle usage or respiratory distress.     Breath sounds: Normal breath sounds and air entry. No decreased breath sounds.  Abdominal:     General: Abdomen is flat.     Palpations: Abdomen is soft.     Tenderness: There is no abdominal tenderness. There is no guarding or rebound.  Musculoskeletal:        General: Normal range of motion.     Cervical back: No rigidity or torticollis.     Right lower leg: No edema.     Left lower leg: No edema.  Skin:    General: Skin is warm and dry.     Capillary Refill: Capillary refill takes less than 2 seconds.  Neurological:     Mental Status: He is alert and oriented to person, place, and time.     GCS: GCS eye subscore is 4. GCS verbal subscore is 5. GCS motor subscore is 6.     Cranial Nerves: Cranial nerves 2-12 are intact. No dysarthria or facial asymmetry.     Sensory: Sensation is intact.     Motor: Motor function is intact. No tremor or pronator drift.     Coordination: Coordination is intact. Finger-Nose-Finger Test normal.     Comments: Strength 5/5 bilateral upper and lower extremities No pronator drift Speech fluent  Psychiatric:        Mood and Affect: Mood normal.        Behavior:  Behavior normal.     ED Results and Treatments Labs (all labs ordered are listed, but only abnormal results are displayed) Labs Reviewed  BASIC METABOLIC PANEL - Abnormal; Notable for the following components:      Result Value   Glucose, Bld 142 (*)    Creatinine, Ser 1.67 (*)    GFR, Estimated 40 (*)    All other components within normal limits  URINALYSIS, ROUTINE W REFLEX MICROSCOPIC - Abnormal; Notable for the following components:   Color, Urine STRAW (*)    All other components within normal limits  BRAIN NATRIURETIC PEPTIDE - Abnormal; Notable for the following components:   B Natriuretic Peptide  301.0 (*)    All other components within normal limits  T4, FREE - Abnormal; Notable for the following components:   Free T4 1.49 (*)    All other components within normal limits  CBG MONITORING, ED - Abnormal; Notable for the following components:   Glucose-Capillary 177 (*)    All other components within normal limits  CBC  TSH  MAGNESIUM  TROPONIN I (HIGH SENSITIVITY)  TROPONIN I (HIGH SENSITIVITY)                                                                                                                          Radiology CT Head Wo Contrast  Result Date: 09/03/2022 CLINICAL DATA:  Headache new onset. EXAM: CT HEAD WITHOUT CONTRAST TECHNIQUE: Contiguous axial images were obtained from the base of the skull through the vertex without intravenous contrast. RADIATION DOSE REDUCTION: This exam was performed according to the departmental dose-optimization program which includes automated exposure control, adjustment of the mA and/or kV according to patient size and/or use of iterative reconstruction technique. COMPARISON:  CT head without contrast 07/16/2021 FINDINGS: Brain: Mild generalized atrophy and white matter disease is stable. No acute infarct, hemorrhage, or mass lesion is present. The ventricles are of normal size. No significant extraaxial fluid collection is present.  The brainstem and cerebellum are within normal limits. Midline structures are within normal limits. Vascular: Atherosclerotic calcifications are present within the cavernous internal carotid arteries bilaterally no hyperdense vessel is present. Skull: Calvarium is intact. No focal lytic or blastic lesions are present. No significant extracranial soft tissue lesion is present. Sinuses/Orbits: Bilateral lens replacements are noted. Globes and orbits are otherwise unremarkable. Fluid level is present in the left maxillary sinus. The paranasal sinuses and mastoid air cells are otherwise clear. IMPRESSION: 1. No acute intracranial abnormality or significant interval change. 2. Stable atrophy and white matter disease. This likely reflects the sequela of chronic microvascular ischemia. 3. Acute left maxillary sinusitis. Electronically Signed   By: Marin Roberts M.D.   On: 09/03/2022 12:32   DG Chest 2 View  Result Date: 09/03/2022 CLINICAL DATA:  Dizziness EXAM: CHEST - 2 VIEW COMPARISON:  Chest x-ray dated July 16, 2021 FINDINGS: Cardiac and mediastinal contours within normal limits. Prior median sternotomy. Stable chronic interstitial opacities. No focal consolidation. No evidence of pleural effusion or pneumothorax. IMPRESSION: 1. No acute cardiopulmonary disease. 2. Background chronic interstitial lung disease. Electronically Signed   By: Allegra Lai M.D.   On: 09/03/2022 12:22    Pertinent labs & imaging results that were available during my care of the patient were reviewed by me and considered in my medical decision making (see MDM for details).  Medications Ordered in ED Medications  sodium chloride 0.9 % bolus 500 mL (500 mLs Intravenous New Bag/Given 09/03/22 1300)  Procedures Procedures  (including critical care time)  Medical Decision Making / ED  Course    Medical Decision Making:    WALTHER GRAETZ is a 82 y.o. male with past medical history as below, significant for CAD, CHF, CKD stage 3, HLD, afib, GERD, ILD, T2DM who presents to the ED with complaint of elevated bp, ?headache. . The complaint involves an extensive differential diagnosis and also carries with it a high risk of complications and morbidity.  Serious etiology was considered. Ddx includes but is not limited to: Electrolyte derangement, metabolic disturbance, endocrine disturbance, cardiac abnormality, medication effect, etc.  Complete initial physical exam performed, notably the patient  was no acute distress, family at bedside, acting at baseline, neuro exam is nonfocal.    Reviewed and confirmed nursing documentation for past medical history, family history, social history.  Vital signs reviewed.    Clinical Course as of 09/03/22 1630  Sun Sep 03, 2022  1156 Creatinine(!): 1.67 Mildly worsened from baseline.  Will give bolus IV fluids [SG]  1424 Pt feeling better [SG]  1507 Trop 10 > 10, no chest pain. No arrhythmia on tele, EKG w/o stemi, type 1 AV block noted (chronic). He is feeling better, symptoms resolved.  [SG]  1603 Feeling better on recheck, gait steady, feels back to baseline. Neuro exam remains non-focal.  [SG]    Clinical Course User Index [SG] Sloan Leiter, DO   Patient here with vague complaints, possible dizziness versus headache versus lightheadedness.  Asymptomatic upon arrival to the ED.  No chest pain or dyspnea.  Vital signs been stable for his duration of care.  Labs stable.  CT imaging of the head is nonacute, cardiac workup is benign.  EKG is also stable.  Is ambulatory, feels back to normal.  Unclear etiology of his complaints today, possibly secondary to his blood pressure abnormality this morning which is since resolved.  Cerebellar testing is negative.  No nausea or vomiting, no specific dizziness reported more so lightheadedness  difficult for patient to explain exactly what his symptoms are but this time they have resolved.  Recommend he follow-up with his cardiology team for further medication management  Patient appears stable for discharge at this time  The patient improved significantly and was discharged in stable condition. Detailed discussions were had with the patient regarding current findings, and need for close f/u with PCP or on call doctor. The patient has been instructed to return immediately if the symptoms worsen in any way for re-evaluation. Patient verbalized understanding and is in agreement with current care plan. All questions answered prior to discharge.         Additional history obtained: -Additional history obtained from family and spouse -External records from outside source obtained and reviewed including: Chart review including previous notes, labs, imaging, consultation notes including home medications, prior ED visits, prior labs and imaging Patient follows with Dr. Vassie Loll pulmonology for ILD and Dr. Tomie China for his CAD, also follows with Dr. Elberta Fortis given history of SVT   Lab Tests: -I ordered, reviewed, and interpreted labs.   The pertinent results include:   Labs Reviewed  BASIC METABOLIC PANEL - Abnormal; Notable for the following components:      Result Value   Glucose, Bld 142 (*)    Creatinine, Ser 1.67 (*)    GFR, Estimated 40 (*)    All other components within normal limits  URINALYSIS, ROUTINE W REFLEX MICROSCOPIC - Abnormal; Notable for the following components:   Color,  Urine STRAW (*)    All other components within normal limits  BRAIN NATRIURETIC PEPTIDE - Abnormal; Notable for the following components:   B Natriuretic Peptide 301.0 (*)    All other components within normal limits  T4, FREE - Abnormal; Notable for the following components:   Free T4 1.49 (*)    All other components within normal limits  CBG MONITORING, ED - Abnormal; Notable for the following  components:   Glucose-Capillary 177 (*)    All other components within normal limits  CBC  TSH  MAGNESIUM  TROPONIN I (HIGH SENSITIVITY)  TROPONIN I (HIGH SENSITIVITY)    Notable for as above, stable  EKG   EKG Interpretation  Date/Time:  Sunday September 03 2022 12:38:30 EDT Ventricular Rate:  56 PR Interval:  239 QRS Duration: 104 QT Interval:  426 QTC Calculation: 412 R Axis:   77 Text Interpretation: Sinus rhythm Prolonged PR interval with 1st degree A-V block similar to prior no stemi Confirmed by Tanda Rockers ([id number]) on 09/03/2022 1:26:20 PM         Imaging Studies ordered: I ordered imaging studies including CXR CTH I independently visualized the following imaging with scope of interpretation limited to determining acute life threatening conditions related to emergency care; findings noted above, significant for stable, no large ICH or PTX I independently visualized and interpreted imaging. I agree with the radiologist interpretation   Medicines ordered and prescription drug management: Meds ordered this encounter  Medications   sodium chloride 0.9 % bolus 500 mL    -I have reviewed the patients home medicines and have made adjustments as needed   Consultations Obtained: na   Cardiac Monitoring: The patient was maintained on a cardiac monitor.  I personally viewed and interpreted the cardiac monitored which showed an underlying rhythm of: NSR  Social Determinants of Health:  Diagnosis or treatment significantly limited by social determinants of health: non smoker   Reevaluation: After the interventions noted above, I reevaluated the patient and found that they have resolved  Co morbidities that complicate the patient evaluation  Past Medical History:  Diagnosis Date   Abnormal EKG 02/21/2015   Acute lower GI bleeding 05/28/2019   Anginal pain (HCC) 2016   Angio-edema    Anxiety    Arthritis    CAD S/P percutaneous coronary angioplasty    NSTEMI  treated with LAD PCI with DES x 03 Feb 2015 Myoview low risk Nov 2017   Cataract    Cerebellar stroke (HCC) 06/14/2021   CHF (congestive heart failure) (HCC) 2020   after surgery only   Chronic diarrhea of unknown origin    CKD (chronic kidney disease) stage 3, GFR 30-59 ml/min 12/21/2017   Colitis 05/23/2015   Coronary artery disease 05/28/2019   Diverticular hemorrhage    Diverticulosis    Dyslipidemia, goal LDL below 70 02/21/2015   hyperlipidemia    Dysrhythmia    A-fib   Enterotoxigenic Escherichia coli infection 05/24/2015   Essential hypertension 02/21/2015   Geographic tongue    GERD (gastroesophageal reflux disease)    GERD without esophagitis 06/15/2021   GI bleed 12/21/2017   Glossitis, benign migratory 12/04/2018   Gout    Hematochezia 12/21/2017   Admitted Sept 2019 with lower GI bleeding felt to be secondary to hemorrhoids   Hereditary angioedema type 2 (HCC) 07/28/2017   High cholesterol    History of kidney stones    History of non-ST elevation myocardial infarction (NSTEMI) 02/22/2015   Nov 2016  History of prolonged Q-T interval on ECG    History of prostate cancer    Hoarseness 12/04/2018   Hx of Clostridium difficile infection    Hx of umbilical hernia repair    Hypertension    Hypothyroidism    Hypothyroidism, unspecified 06/15/2021   ILD (interstitial lung disease) (HCC) 12/17/2013   ?related to chicken farm exposure or aspiration pneumonitis from GERD Previously attributed to acute interstitial pneumonitis but based on HRCT this is likely IPF, "probable UIP"   Interstitial lung disease (HCC)    Lower GI bleed 06/03/2019   Malignant neoplasm of prostate (HCC) 08/07/2014   Myocardial infarction (HCC) 2016   NSTEMI (non-ST elevated myocardial infarction) (HCC) 05/21/2018   Open metatarsal fracture 09/02/2020   PAF (paroxysmal atrial fibrillation) (HCC) 06/13/2018   Post CABG- discharged on Amiodarone, he will probably not need this long term    Palpitations 03/15/2015   palpitations    Peripheral vascular disease (HCC)    vericose veins   Personal history of digestive disease    gastric ulcer   Pre-diabetes    Pre-operative clearance 01/02/2018   Prostate cancer (HCC)    Prostate enlargement    S/P CABG x 3 05/28/2018    LIMA-LAD, RIMA-RCA, and Lt radial to OM.   Skin cancer    basal cell carcinoma   STEC (Shiga toxin-producing Escherichia coli) infection 05/24/2015   Stroke (cerebrum) (HCC) 06/15/2021   Tachycardia determined by examination of pulse    Thyroid disease    Type 2 diabetes mellitus with unspecified complications (HCC)    Type 2 diabetes mellitus without complications (HCC) 06/15/2021   Urticaria       Dispostion: Disposition decision including need for hospitalization was considered, and patient discharged from emergency department.    Final Clinical Impression(s) / ED Diagnoses Final diagnoses:  Adverse effect of drug, initial encounter  Lightheadedness     This chart was dictated using voice recognition software.  Despite best efforts to proofread,  errors can occur which can change the documentation meaning.    Sloan Leiter, DO 09/03/22 1630

## 2022-09-07 ENCOUNTER — Other Ambulatory Visit: Payer: Self-pay

## 2022-09-07 ENCOUNTER — Telehealth: Payer: Self-pay | Admitting: *Deleted

## 2022-09-07 NOTE — Telephone Encounter (Signed)
Transition Care Management Unsuccessful Follow-up Telephone Call  Date of discharge and from where:  [facility] 09/03/2022  Attempts:  1st Attempt  Reason for unsuccessful TCM follow-up call:  Left voice message    

## 2022-09-08 ENCOUNTER — Telehealth: Payer: Self-pay | Admitting: *Deleted

## 2022-09-08 NOTE — Telephone Encounter (Signed)
Transition Care Management Unsuccessful Follow-up Telephone Call  Date of discharge and from where:  [facility]   09/03/2022  Attempts:  2nd Attempt  Reason for unsuccessful TCM follow-up call:  Left voice message

## 2022-09-18 DIAGNOSIS — E785 Hyperlipidemia, unspecified: Secondary | ICD-10-CM | POA: Diagnosis not present

## 2022-09-18 DIAGNOSIS — E559 Vitamin D deficiency, unspecified: Secondary | ICD-10-CM | POA: Diagnosis not present

## 2022-09-18 DIAGNOSIS — E039 Hypothyroidism, unspecified: Secondary | ICD-10-CM | POA: Diagnosis not present

## 2022-09-18 DIAGNOSIS — E1169 Type 2 diabetes mellitus with other specified complication: Secondary | ICD-10-CM | POA: Diagnosis not present

## 2022-09-18 DIAGNOSIS — I700 Atherosclerosis of aorta: Secondary | ICD-10-CM | POA: Diagnosis not present

## 2022-09-18 DIAGNOSIS — D841 Defects in the complement system: Secondary | ICD-10-CM | POA: Diagnosis not present

## 2022-09-18 DIAGNOSIS — C61 Malignant neoplasm of prostate: Secondary | ICD-10-CM | POA: Diagnosis not present

## 2022-09-18 DIAGNOSIS — J849 Interstitial pulmonary disease, unspecified: Secondary | ICD-10-CM | POA: Diagnosis not present

## 2022-09-18 DIAGNOSIS — E538 Deficiency of other specified B group vitamins: Secondary | ICD-10-CM | POA: Diagnosis not present

## 2022-09-18 DIAGNOSIS — N1831 Chronic kidney disease, stage 3a: Secondary | ICD-10-CM | POA: Diagnosis not present

## 2022-09-18 DIAGNOSIS — D649 Anemia, unspecified: Secondary | ICD-10-CM | POA: Diagnosis not present

## 2022-09-18 DIAGNOSIS — I10 Essential (primary) hypertension: Secondary | ICD-10-CM | POA: Diagnosis not present

## 2022-09-21 ENCOUNTER — Ambulatory Visit: Payer: Medicare Other | Attending: Cardiology | Admitting: Cardiology

## 2022-09-21 ENCOUNTER — Encounter: Payer: Self-pay | Admitting: Cardiology

## 2022-09-21 VITALS — BP 138/78 | HR 70 | Ht 73.0 in | Wt 194.0 lb

## 2022-09-21 DIAGNOSIS — E7800 Pure hypercholesterolemia, unspecified: Secondary | ICD-10-CM | POA: Insufficient documentation

## 2022-09-21 DIAGNOSIS — I2510 Atherosclerotic heart disease of native coronary artery without angina pectoris: Secondary | ICD-10-CM | POA: Diagnosis not present

## 2022-09-21 DIAGNOSIS — Z7984 Long term (current) use of oral hypoglycemic drugs: Secondary | ICD-10-CM | POA: Diagnosis not present

## 2022-09-21 DIAGNOSIS — I10 Essential (primary) hypertension: Secondary | ICD-10-CM | POA: Insufficient documentation

## 2022-09-21 DIAGNOSIS — E119 Type 2 diabetes mellitus without complications: Secondary | ICD-10-CM | POA: Diagnosis not present

## 2022-09-21 NOTE — Addendum Note (Signed)
Addended by: Eleonore Chiquito on: 09/21/2022 08:40 AM   Modules accepted: Orders

## 2022-09-21 NOTE — Patient Instructions (Signed)
Medication Instructions:  Your physician recommends that you continue on your current medications as directed. Please refer to the Current Medication list given to you today.  *If you need a refill on your cardiac medications before your next appointment, please call your pharmacy*   Lab Work: Your physician recommends that you return for lab work in: 2 months. You need to have labs done when you are fasting.  You can come Monday through Friday 8:30 am to 12:00 pm and 1:15 to 4:30. You do not need to make an appointment as the order has already been placed. The labs you are going to have done are CMP, TSH and Lipids.  If you have labs (blood work) drawn today and your tests are completely normal, you will receive your results only by: MyChart Message (if you have MyChart) OR A paper copy in the mail If you have any lab test that is abnormal or we need to change your treatment, we will call you to review the results.   Testing/Procedures: None ordered   Follow-Up: At [facility], you and your health needs are our priority.  As part of our continuing mission to provide you with exceptional heart care, we have created designated Provider Care Teams.  These Care Teams include your primary Cardiologist (physician) and Advanced Practice Providers (APPs -  Physician Assistants and Nurse Practitioners) who all work together to provide you with the care you need, when you need it.  We recommend signing up for the patient portal called "MyChart".  Sign up information is provided on this After Visit Summary.  MyChart is used to connect with patients for Virtual Visits (Telemedicine).  Patients are able to view lab/test results, encounter notes, upcoming appointments, etc.  Non-urgent messages can be sent to your provider as well.   To learn more about what you can do with MyChart, go to [email or web address].    Your next appointment:   9 month(s)  The format for your next appointment:    In Person  Provider:   Belva Crome, MD    Other Instructions none  Important Information About Sugar

## 2022-09-21 NOTE — Progress Notes (Signed)
Cardiology Office Note:    Date:  09/21/2022   ID:  Daniel Herrera, DOB [id number], MRN [id number]  PCP:  Hurshel Party, NP  Cardiologist:  Garwin Brothers, MD   Referring MD: Sloan Leiter, DO    ASSESSMENT:    1. Essential hypertension   2. Coronary artery disease involving native coronary artery of native heart without angina pectoris   3. Type 2 diabetes mellitus without complication, unspecified whether long term insulin use (HCC)   4. High cholesterol    PLAN:    In order of problems listed above:  Coronary artery disease: Secondary prevention stressed with the patient.  Importance of compliance with diet medication stressed and vocalized understanding.  He is good about his exercise and I congratulated him. Essential-; blood pressure stable and diet was emphasized.  He showed me blood pressure readings from home and they are stable.  They are in the range of 120/70. Mixed dyslipidemia: On lipid-lowering medications but numbers are not at goal.  Diet and lifestyle modification urged he will be back in 2 months for follow-up blood work.  He is agreeable. Renal insufficiency.  Managed by primary care.  I explained precautions. Patient will be seen in follow-up appointment in 9 months or earlier if the patient has any concerns.    Medication Adjustments/Labs and Tests Ordered: Current medicines are reviewed at length with the patient today.  Concerns regarding medicines are outlined above.  No orders of the defined types were placed in this encounter.  No orders of the defined types were placed in this encounter.    No chief complaint on file.    History of Present Illness:    Daniel Herrera is a 82 y.o. male.  Patient has past medical history of coronary artery disease, essential hypertension, mixed dyslipidemia and diabetes mellitus.  She denies any problems.  He takes care of activities of daily living.  No chest pain orthopnea or PND.  At the time of my  evaluation, the patient is alert awake oriented and in no distress.  He walks on a regular basis.  Past Medical History:  Diagnosis Date   Abnormal EKG 02/21/2015   Acute lower GI bleeding 05/28/2019   Anginal pain (HCC) 2016   Angio-edema    Anxiety    Arthritis    CAD S/P percutaneous coronary angioplasty    NSTEMI treated with LAD PCI with DES x 03 Feb 2015 Myoview low risk Nov 2017   Cataract    Cerebellar stroke (HCC) 06/14/2021   CHF (congestive heart failure) (HCC) 2020   after surgery only   Chronic diarrhea of unknown origin    CKD (chronic kidney disease) stage 3, GFR 30-59 ml/min 12/21/2017   Colitis 05/23/2015   Coronary artery disease 05/28/2019   Diverticular hemorrhage    Diverticulosis    Dyslipidemia, goal LDL below 70 02/21/2015   hyperlipidemia    Dysrhythmia    A-fib   Enterotoxigenic Escherichia coli infection 05/24/2015   Essential hypertension 02/21/2015   Geographic tongue    GERD (gastroesophageal reflux disease)    GERD without esophagitis 06/15/2021   GI bleed 12/21/2017   Glossitis, benign migratory 12/04/2018   Gout    Hematochezia 12/21/2017   Admitted Sept 2019 with lower GI bleeding felt to be secondary to hemorrhoids   Hereditary angioedema type 2 (HCC) 07/28/2017   High cholesterol    History of kidney stones    History of non-ST elevation myocardial infarction (NSTEMI) 02/22/2015  Nov 2016   History of prolonged Q-T interval on ECG    History of prostate cancer    Hoarseness 12/04/2018   Hx of Clostridium difficile infection    Hx of umbilical hernia repair    Hypertension    Hypothyroidism    Hypothyroidism, unspecified 06/15/2021   ILD (interstitial lung disease) (HCC) 12/17/2013   ?related to chicken farm exposure or aspiration pneumonitis from GERD Previously attributed to acute interstitial pneumonitis but based on HRCT this is likely IPF, "probable UIP"   Lower GI bleed 06/03/2019   Malignant neoplasm of prostate (HCC)  08/07/2014   Myocardial infarction (HCC) 2016   NSTEMI (non-ST elevated myocardial infarction) (HCC) 05/21/2018   Open metatarsal fracture 09/02/2020   PAF (paroxysmal atrial fibrillation) (HCC) 06/13/2018   Post CABG- discharged on Amiodarone, he will probably not need this long term   Palpitations 03/15/2015   palpitations    Peripheral vascular disease (HCC)    vericose veins   Personal history of digestive disease    gastric ulcer   Pre-diabetes    Pre-operative clearance 01/02/2018   Prostate cancer (HCC)    Prostate enlargement    S/P CABG x 3 05/28/2018    LIMA-LAD, RIMA-RCA, and Lt radial to OM.   Skin cancer    basal cell carcinoma   STEC (Shiga toxin-producing Escherichia coli) infection 05/24/2015   Stroke (cerebrum) (HCC) 06/15/2021   Tachycardia determined by examination of pulse    Thyroid disease    Type 2 diabetes mellitus with unspecified complications (HCC)    Type 2 diabetes mellitus without complications (HCC) 06/15/2021   Urticaria     Past Surgical History:  Procedure Laterality Date   APPENDECTOMY     BACK SURGERY     CARDIAC CATHETERIZATION N/A 02/21/2015   Procedure: Left Heart Cath;  Surgeon: Runell Gess, MD;  Location: [facility] INVASIVE CV LAB;  Service: Cardiovascular;  Laterality: N/A;   CATARACT EXTRACTION, BILATERAL     CORONARY ARTERY BYPASS GRAFT N/A 05/24/2018   Procedure: CORONARY ARTERY BYPASS GRAFTING (CABG), ON PUMP, TIMES THREE, USING BILATERAL INTERNAL MAMMARY ARTERY AND HARVESTED LEFT RADIAL ARTERY;  Surgeon: Alleen Borne, MD;  Location: [facility] OR;  Service: Open Heart Surgery;  Laterality: N/A;   CYSTOSCOPY WITH LITHOLAPAXY N/A 07/26/2020   Procedure: CYSTOSCOPY WITH LITHOLAPAXY WITH FULGERATION;  Surgeon: Heloise Purpura, MD;  Location: [facility] ORS;  Service: Urology;  Laterality: N/A;   HOLMIUM LASER APPLICATION N/A 07/26/2020   Procedure: HOLMIUM LASER APPLICATION;  Surgeon: Heloise Purpura, MD;  Location: [facility] ORS;  Service: Urology;   Laterality: N/A;   I & D EXTREMITY Right 09/02/2020   Procedure: IRRIGATION AND DEBRIDEMENT RIGHT 2ND AND 3RD METATARSAL;  Surgeon: Toni Arthurs, MD;  Location: [facility] OR;  Service: Orthopedics;  Laterality: Right;   LEFT HEART CATH AND CORONARY ANGIOGRAPHY N/A 05/22/2018   Procedure: LEFT HEART CATH AND CORONARY ANGIOGRAPHY;  Surgeon: Corky Crafts, MD;  Location: [facility] INVASIVE CV LAB;  Service: Cardiovascular;  Laterality: N/A;   NASAL SINUS SURGERY     ORIF TOE FRACTURE Right 09/02/2020   Procedure: OPEN REDUCTION INTERNAL FIXATION (ORIF) OF RIGHT OPEN 2ND AND 3RD METATARSAL (TOE) OPEN FRACTURE;  Surgeon: Toni Arthurs, MD;  Location: [facility] OR;  Service: Orthopedics;  Laterality: Right;   PROSTATE BIOPSY     RADIAL ARTERY HARVEST Left 05/24/2018   Procedure: RADIAL ARTERY HARVEST;  Surgeon: Alleen Borne, MD;  Location: [facility] OR;  Service: Open Heart Surgery;  Laterality: Left;  right shoulder rotator cuff repair     TEE WITHOUT CARDIOVERSION N/A 05/24/2018   Procedure: TRANSESOPHAGEAL ECHOCARDIOGRAM (TEE);  Surgeon: Alleen Borne, MD;  Location: [facility] OR;  Service: Open Heart Surgery;  Laterality: N/A;    Current Medications: Current Meds  Medication Sig   amLODipine (NORVASC) 2.5 MG tablet Take 2.5 mg by mouth daily.   aspirin 81 MG EC tablet Take 1 tablet (81 mg total) by mouth daily.   Azelastine HCl 137 MCG/SPRAY SOLN Place 1 spray into both nostrils 2 (two) times daily.   betamethasone valerate ointment (VALISONE) 0.1 % Apply 1 application topically 2 (two) times daily as needed for rash.   busPIRone (BUSPAR) 5 MG tablet Take 5 mg by mouth 3 (three) times daily.   Cyanocobalamin (VITAMIN B-12) 5000 MCG TBDP Take 5,000 mcg by mouth daily.    dextromethorphan-guaiFENesin (MUCINEX DM) 30-600 MG 12hr tablet Take 1 tablet by mouth in the morning and at bedtime.   EPINEPHRINE 0.3 mg/0.3 mL IJ SOAJ injection INJECT AS DIRECTED FOR LIFE-THREATHENING ALLERGIC REACTION   fluoruracil (CARAC) 0.5 %  cream Apply to skin cancer for 1-7 days during 'flare up' as directed.   fluticasone (FLONASE) 50 MCG/ACT nasal spray Place 1 spray into both nostrils 2 (two) times daily.   glipiZIDE (GLUCOTROL XL) 2.5 MG 24 hr tablet Take 2.5 mg by mouth every morning.   hydrALAZINE (APRESOLINE) 10 MG tablet Take 1 tablet (10 mg total) by mouth 3 (three) times daily as needed for up to 10 doses. Please take for systolic blood pressure >180   icatibant Acetate (FIRAZYR) 30 MG/3ML injection Inject 3 mLs (30 mg total) into the skin as needed.   levothyroxine (SYNTHROID) 100 MCG tablet Take 100 mcg by mouth daily before breakfast.   losartan (COZAAR) 25 MG tablet Take 1 tablet (25 mg total) by mouth daily.   magic mouthwash SOLN Take 15 mLs by mouth 3 (three) times daily as needed for mouth pain.   metoprolol succinate (TOPROL-XL) 25 MG 24 hr tablet Take 1 tablet (25 mg total) by mouth 2 (two) times daily.   nabumetone (RELAFEN) 750 MG tablet Take 750 mg by mouth 2 (two) times daily.   nitroGLYCERIN (NITROSTAT) 0.4 MG SL tablet Place 0.4 mg under the tongue every 5 (five) minutes as needed for chest pain.   OVER THE COUNTER MEDICATION Take 1 capsule by mouth in the morning and at bedtime. Health Plus/Super Colon Cleanse   pantoprazole (PROTONIX) 40 MG tablet Take 40 mg by mouth 2 (two) times daily.   Pitavastatin Calcium (LIVALO) 4 MG TABS Take 4 mg by mouth every evening.   Probiotic Product (PROBIOTIC DAILY PO) Take 1 capsule by mouth daily.   TAKHZYRO 300 MG/2ML SOSY INJECT 300MG  SUBCUTANEOUSLY  EVERY 2 WEEKS   temazepam (RESTORIL) 15 MG capsule Take 15 mg by mouth at bedtime.     Allergies:   Lisinopril, Amlodipine, Oxycodone, Tape, Benadryl [diphenhydramine hcl], Darvocet [propoxyphene n-acetaminophen], Darvon [propoxyphene hcl], and Tylenol [acetaminophen]   Social History   Socioeconomic History   Marital status: Married    Spouse name: Not on file   Number of children: Not on file   Years of  education: Not on file   Highest education level: Not on file  Occupational History   Occupation: retired  Tobacco Use   Smoking status: Never   Smokeless tobacco: Never  Vaping Use   Vaping Use: Never used  Substance and Sexual Activity   Alcohol use: Never  Drug use: No   Sexual activity: Not on file  Other Topics Concern   Not on file  Social History Narrative   Not on file   Social Determinants of Health   Financial Resource Strain: Not on file  Food Insecurity: No Food Insecurity (06/02/2019)   Hunger Vital Sign    Worried About Running Out of Food in the Last Year: Never true    Ran Out of Food in the Last Year: Never true  Transportation Needs: No Transportation Needs (06/02/2019)   PRAPARE - Administrator, Civil Service (Medical): No    Lack of Transportation (Non-Medical): No  Physical Activity: Not on file  Stress: Not on file  Social Connections: Not on file     Family History: The patient's family history includes Cancer in his father, mother, and sister; Stomach cancer in his mother. There is no history of Sleep apnea.  ROS:   Please see the history of present illness.    All other systems reviewed and are negative.  EKGs/Labs/Other Studies Reviewed:    The following studies were reviewed today: I discussed my findings with the patient at length.   Recent Labs: 05/27/2022: ALT 22 09/03/2022: B Natriuretic Peptide 301.0; BUN 19; Creatinine, Ser 1.67; Hemoglobin 14.2; Magnesium 2.0; Platelets 158; Potassium 4.0; Sodium 136; TSH 2.610  Recent Lipid Panel    Component Value Date/Time   CHOL 156 06/15/2021 0308   CHOL 183 05/23/2019 0826   TRIG 180 (H) 06/15/2021 0308   HDL 30 (L) 06/15/2021 0308   HDL 44 05/23/2019 0826   CHOLHDL 5.2 06/15/2021 0308   VLDL 36 06/15/2021 0308   LDLCALC 90 06/15/2021 0308   LDLCALC 108 (H) 05/23/2019 0826    Physical Exam:    VS:  BP 138/78   Pulse 70   Ht 6\' 1"  (1.854 m)   Wt 194 lb (88 kg)   SpO2  96%   BMI 25.60 kg/m     Wt Readings from Last 3 Encounters:  09/21/22 194 lb (88 kg)  09/03/22 183 lb (83 kg)  07/17/22 193 lb (87.5 kg)     GEN: Patient is in no acute distress HEENT: Normal NECK: No JVD; No carotid bruits LYMPHATICS: No lymphadenopathy CARDIAC: Hear sounds regular, 2/6 systolic murmur at the apex. RESPIRATORY:  Clear to auscultation without rales, wheezing or rhonchi  ABDOMEN: Soft, non-tender, non-distended MUSCULOSKELETAL:  No edema; No deformity  SKIN: Warm and dry NEUROLOGIC:  Alert and oriented x 3 PSYCHIATRIC:  Normal affect   Signed, Garwin Brothers, MD  09/21/2022 8:30 AM    [facility]

## 2022-09-26 ENCOUNTER — Other Ambulatory Visit: Payer: Self-pay | Admitting: Allergy and Immunology

## 2022-09-26 NOTE — Telephone Encounter (Signed)
If this is appropriate to refill for your patient can you please refill this for them? Dr. Lucie Leather wanted to start him on it but have Cardiology continue the medication refills if appropriate.

## 2022-10-03 DIAGNOSIS — N401 Enlarged prostate with lower urinary tract symptoms: Secondary | ICD-10-CM | POA: Diagnosis not present

## 2022-10-03 DIAGNOSIS — R351 Nocturia: Secondary | ICD-10-CM | POA: Diagnosis not present

## 2022-10-03 DIAGNOSIS — C61 Malignant neoplasm of prostate: Secondary | ICD-10-CM | POA: Diagnosis not present

## 2022-11-07 DIAGNOSIS — E119 Type 2 diabetes mellitus without complications: Secondary | ICD-10-CM | POA: Diagnosis not present

## 2022-11-07 DIAGNOSIS — Z961 Presence of intraocular lens: Secondary | ICD-10-CM | POA: Diagnosis not present

## 2022-11-07 DIAGNOSIS — H04123 Dry eye syndrome of bilateral lacrimal glands: Secondary | ICD-10-CM | POA: Diagnosis not present

## 2022-11-07 DIAGNOSIS — H35373 Puckering of macula, bilateral: Secondary | ICD-10-CM | POA: Diagnosis not present

## 2022-12-06 DIAGNOSIS — Z23 Encounter for immunization: Secondary | ICD-10-CM | POA: Diagnosis not present

## 2022-12-19 DIAGNOSIS — H04123 Dry eye syndrome of bilateral lacrimal glands: Secondary | ICD-10-CM | POA: Diagnosis not present

## 2022-12-22 DIAGNOSIS — N1831 Chronic kidney disease, stage 3a: Secondary | ICD-10-CM | POA: Diagnosis not present

## 2022-12-22 DIAGNOSIS — J849 Interstitial pulmonary disease, unspecified: Secondary | ICD-10-CM | POA: Diagnosis not present

## 2022-12-22 DIAGNOSIS — E538 Deficiency of other specified B group vitamins: Secondary | ICD-10-CM | POA: Diagnosis not present

## 2022-12-22 DIAGNOSIS — E785 Hyperlipidemia, unspecified: Secondary | ICD-10-CM | POA: Diagnosis not present

## 2022-12-22 DIAGNOSIS — D649 Anemia, unspecified: Secondary | ICD-10-CM | POA: Diagnosis not present

## 2022-12-22 DIAGNOSIS — E039 Hypothyroidism, unspecified: Secondary | ICD-10-CM | POA: Diagnosis not present

## 2022-12-22 DIAGNOSIS — I700 Atherosclerosis of aorta: Secondary | ICD-10-CM | POA: Diagnosis not present

## 2022-12-22 DIAGNOSIS — D841 Defects in the complement system: Secondary | ICD-10-CM | POA: Diagnosis not present

## 2022-12-22 DIAGNOSIS — I10 Essential (primary) hypertension: Secondary | ICD-10-CM | POA: Diagnosis not present

## 2022-12-22 DIAGNOSIS — E559 Vitamin D deficiency, unspecified: Secondary | ICD-10-CM | POA: Diagnosis not present

## 2022-12-22 DIAGNOSIS — C61 Malignant neoplasm of prostate: Secondary | ICD-10-CM | POA: Diagnosis not present

## 2022-12-22 DIAGNOSIS — E1169 Type 2 diabetes mellitus with other specified complication: Secondary | ICD-10-CM | POA: Diagnosis not present

## 2023-01-04 ENCOUNTER — Emergency Department (HOSPITAL_COMMUNITY)
Admission: EM | Admit: 2023-01-04 | Discharge: 2023-01-04 | Disposition: A | Discharge status: Home / Self Care | Payer: Medicare Other | Attending: Emergency Medicine | Admitting: Emergency Medicine

## 2023-01-04 ENCOUNTER — Other Ambulatory Visit: Payer: Self-pay

## 2023-01-04 ENCOUNTER — Emergency Department (HOSPITAL_COMMUNITY): Payer: Medicare Other

## 2023-01-04 DIAGNOSIS — S2241XA Multiple fractures of ribs, right side, initial encounter for closed fracture: Secondary | ICD-10-CM | POA: Insufficient documentation

## 2023-01-04 DIAGNOSIS — W19XXXA Unspecified fall, initial encounter: Secondary | ICD-10-CM | POA: Diagnosis not present

## 2023-01-04 DIAGNOSIS — Z8546 Personal history of malignant neoplasm of prostate: Secondary | ICD-10-CM | POA: Diagnosis not present

## 2023-01-04 DIAGNOSIS — I2510 Atherosclerotic heart disease of native coronary artery without angina pectoris: Secondary | ICD-10-CM | POA: Diagnosis not present

## 2023-01-04 DIAGNOSIS — Z79899 Other long term (current) drug therapy: Secondary | ICD-10-CM | POA: Diagnosis not present

## 2023-01-04 DIAGNOSIS — N189 Chronic kidney disease, unspecified: Secondary | ICD-10-CM | POA: Diagnosis not present

## 2023-01-04 DIAGNOSIS — E1122 Type 2 diabetes mellitus with diabetic chronic kidney disease: Secondary | ICD-10-CM | POA: Diagnosis not present

## 2023-01-04 DIAGNOSIS — Y9301 Activity, walking, marching and hiking: Secondary | ICD-10-CM | POA: Insufficient documentation

## 2023-01-04 DIAGNOSIS — S301XXA Contusion of abdominal wall, initial encounter: Secondary | ICD-10-CM | POA: Diagnosis not present

## 2023-01-04 DIAGNOSIS — Z7982 Long term (current) use of aspirin: Secondary | ICD-10-CM | POA: Insufficient documentation

## 2023-01-04 DIAGNOSIS — Z7984 Long term (current) use of oral hypoglycemic drugs: Secondary | ICD-10-CM | POA: Insufficient documentation

## 2023-01-04 DIAGNOSIS — Z9181 History of falling: Secondary | ICD-10-CM | POA: Diagnosis not present

## 2023-01-04 DIAGNOSIS — S299XXA Unspecified injury of thorax, initial encounter: Secondary | ICD-10-CM | POA: Diagnosis present

## 2023-01-04 DIAGNOSIS — I129 Hypertensive chronic kidney disease with stage 1 through stage 4 chronic kidney disease, or unspecified chronic kidney disease: Secondary | ICD-10-CM | POA: Diagnosis not present

## 2023-01-04 MED ORDER — TRAMADOL HCL 50 MG PO TABS: 50.0000 mg | ORAL_TABLET | Freq: Four times a day (QID) | ORAL | 0 refills | Status: DC | PRN

## 2023-01-04 MED ORDER — TRAMADOL HCL 50 MG PO TABS
50.0000 mg | ORAL_TABLET | Freq: Once | ORAL | Status: AC
  Administered 2023-01-04: 50 mg via ORAL
  Filled 2023-01-04: qty 1

## 2023-01-04 NOTE — Discharge Instructions (Addendum)
You have been evaluated for your fall.  X-rays today demonstrate that you have 2 broken ribs, #6 and #7 on the right side.  Sometime x-ray may miss other small broken ribs.  Please use incentive spirometer as instructed, you may take tramadol as needed for pain.  Follow-up with your doctor for further care.  Return if you have any concern.

## 2023-01-04 NOTE — ED Provider Notes (Signed)
Daniel Herrera   CSN: [id number] Arrival date & time: 01/04/23  0747     History  Chief Complaint  Patient presents with   Fall   Shortness of Breath    Daniel Herrera is a 82 y.o. male.  The history is provided by the patient, the spouse and medical records. No language interpreter was used.  Fall Associated symptoms include shortness of breath.  Shortness of Breath    82 year old male significant history of interstitial lung disease, prostate cancer, hypertension, CAD, diabetes, CKD, paroxysmal A-fib, prior stroke presenting with complaint of pain from a fall.  Patient report 4 days ago he was walking outside and stepped on some uneven ground and fell landing on the right side of his chest against the ground.  He did not hit his head nor experienced any loss of consciousness.  He thought he may have bruised his chest wall but he noticed for the past several days he is having increasing sharp pain to the chest and now radiates down towards his abdomen.  He also complaining of pain with breathing, occasional cough and noticed a trace of blood in his cough.  Due to the pain and history of pulmonary fibrosis he report breathing is increasingly more difficult.  He does not endorse any headache or neck pain no nausea vomiting diarrhea no hip pain or back pain.  Aside from baby aspirin he denies being on any other blood thinner medication.  Home Medications Prior to Admission medications   Medication Sig Start Date End Date Taking? Authorizing Provider  amLODipine (NORVASC) 2.5 MG tablet Take 2.5 mg by mouth daily. 09/02/22   [provider]  aspirin 81 MG EC tablet Take 1 tablet (81 mg total) by mouth daily. 07/07/21   Anson Fret, MD  Azelastine HCl 137 MCG/SPRAY SOLN Place 1 spray into both nostrils 2 (two) times daily. 02/05/21   [provider]  betamethasone valerate ointment (VALISONE) 0.1 % Apply 1  application topically 2 (two) times daily as needed for rash. 01/18/21   [provider]  busPIRone (BUSPAR) 5 MG tablet Take 5 mg by mouth 3 (three) times daily. 06/01/22   [provider]  Cyanocobalamin (VITAMIN B-12) 5000 MCG TBDP Take 5,000 mcg by mouth daily.     [provider]  dextromethorphan-guaiFENesin (MUCINEX DM) 30-600 MG 12hr tablet Take 1 tablet by mouth in the morning and at bedtime.    [provider]  EPINEPHRINE 0.3 mg/0.3 mL IJ SOAJ injection INJECT AS DIRECTED FOR LIFE-THREATHENING ALLERGIC REACTION 09/09/21   Kozlow, Alvira Philips, MD  fluoruracil [facility]) 0.5 % cream Apply to skin cancer for 1-7 days during 'flare up' as directed. 03/09/22   Kozlow, Alvira Philips, MD  fluticasone (FLONASE) 50 MCG/ACT nasal spray Place 1 spray into both nostrils 2 (two) times daily.    [provider]  glipiZIDE (GLUCOTROL XL) 2.5 MG 24 hr tablet Take 2.5 mg by mouth every morning. 10/21/20   [provider]  hydrALAZINE (APRESOLINE) 10 MG tablet Take 1 tablet (10 mg total) by mouth 3 (three) times daily as needed for up to 10 doses. Please take for systolic blood pressure >180 05/27/22   Florene Route, MD  icatibant Acetate [facility]) 30 MG/3ML injection Inject 3 mLs (30 mg total) into the skin as needed. 03/21/22   Kozlow, Alvira Philips, MD  levothyroxine (SYNTHROID) 100 MCG tablet Take 100 mcg by mouth daily before breakfast.  [provider]  losartan (COZAAR) 25 MG tablet Take 1 tablet (25 mg total) by mouth daily. 09/28/22   Revankar, Aundra Dubin, MD  magic mouthwash SOLN Take 15 mLs by mouth 3 (three) times daily as needed for mouth pain.    [provider]  metoprolol succinate (TOPROL-XL) 25 MG 24 hr tablet Take 1 tablet (25 mg total) by mouth 2 (two) times daily. 06/06/22   Revankar, Aundra Dubin, MD  nabumetone (RELAFEN) 750 MG tablet Take 750 mg by mouth 2 (two) times daily. 08/09/19   [provider]  nitroGLYCERIN (NITROSTAT) 0.4 MG SL  tablet Place 0.4 mg under the tongue every 5 (five) minutes as needed for chest pain.    [provider]  OVER THE COUNTER MEDICATION Take 1 capsule by mouth in the morning and at bedtime. Health Plus/Super Colon Cleanse    [provider]  pantoprazole (PROTONIX) 40 MG tablet Take 40 mg by mouth 2 (two) times daily.    [provider]  Pitavastatin Calcium (LIVALO) 4 MG TABS Take 4 mg by mouth every evening.    [provider]  Probiotic Product (PROBIOTIC DAILY PO) Take 1 capsule by mouth daily.    [provider]  TAKHZYRO 300 MG/2ML SOSY INJECT 300MG  SUBCUTANEOUSLY  EVERY 2 WEEKS 07/17/22   Kozlow, Alvira Philips, MD  temazepam (RESTORIL) 15 MG capsule Take 15 mg by mouth at bedtime. 06/01/22   [provider]      Allergies    Lisinopril, Amlodipine, Oxycodone, Tape, Benadryl [diphenhydramine hcl], Darvocet [propoxyphene n-acetaminophen], Darvon [propoxyphene hcl], and Tylenol [acetaminophen]    Review of Systems   Review of Systems  Respiratory:  Positive for shortness of breath.   All other systems reviewed and are negative.   Physical Exam Updated Vital Signs BP (!) 184/88 (BP Location: Right Arm)   Pulse 71   Temp 98 F (36.7 C) (Oral)   Resp 18   Ht 6\' 1"  (1.854 m)   Wt 86.2 kg   SpO2 99%   BMI 25.07 kg/m  Physical Exam Vitals and nursing Herrera reviewed.  Constitutional:      General: He is not in acute distress.    Appearance: He is well-developed.     Comments: Elderly male hard of hearing but appears to be in no acute discomfort.  HENT:     Head: Atraumatic.  Eyes:     Conjunctiva/sclera: Conjunctivae normal.  Cardiovascular:     Rate and Rhythm: Normal rate and regular rhythm.     Pulses: Normal pulses.     Heart sounds: Normal heart sounds.  Pulmonary:     Effort: Pulmonary effort is normal.     Breath sounds: Rales (Faint crackles heard at lower lung bases likely in the setting of ILD) present.  Chest:      Chest wall: Tenderness (Tenderness to right anterior lateral chest wall palpation without any crepitus, emphysema, obvious bruising noted.) present.  Abdominal:     Palpations: Abdomen is soft.     Tenderness: There is no abdominal tenderness.     Comments: Faint dime size ecchymosis noted to right upper abdomen however minimal tenderness to palpation.  Musculoskeletal:     Cervical back: Neck supple.  Skin:    Findings: No rash.  Neurological:     Mental Status: He is alert and oriented to person, place, and time.     ED Results / Procedures / Treatments   Labs (all labs ordered are listed, but  only abnormal results are displayed) Labs Reviewed - No data to display  EKG None  Radiology DG Ribs Unilateral W/Chest Right  Result Date: 01/04/2023 CLINICAL DATA:  82 year old male with a history of fall and pain EXAM: RIGHT RIBS AND CHEST - 3+ VIEW COMPARISON:  09/03/2022 FINDINGS: Minimally displaced fractures of the right sixth and seventh ribs. No evidence of pneumothorax or large pleural effusion. Changes of pulmonary fibrosis similar to the comparison chest x-ray IMPRESSION: Acute minimally displaced fractures of the right sixth and seventh ribs Electronically Signed   By: Gilmer Mor D.O.   On: 01/04/2023 09:35    Procedures Procedures    Medications Ordered in ED Medications  traMADol (ULTRAM) tablet 50 mg (50 mg Oral Given 01/04/23 1026)    ED Course/ Medical Decision Making/ A&P                                 Medical Decision Making  BP (!) 184/88 (BP Location: Right Arm)   Pulse 71   Temp 98 F (36.7 C) (Oral)   Resp 18   Ht 6\' 1"  (1.854 m)   Wt 86.2 kg   SpO2 99%   BMI 25.07 kg/m   35:40 AM 82 year old male significant history of interstitial lung disease, prostate cancer, hypertension, CAD, diabetes, CKD, paroxysmal A-fib, prior stroke presenting with complaint of pain from a fall.  Patient report 4 days ago he was walking outside and stepped on some  uneven ground and fell landing on the right side of his chest against the ground.  He did not hit his head nor experienced any loss of consciousness.  He thought he may have bruised his chest wall but he noticed for the past several days he is having increasing sharp pain to the chest and now radiates down towards his abdomen.  He also complaining of pain with breathing, occasional cough and noticed a trace of blood in his cough.  Due to the pain and history of pulmonary fibrosis he report breathing is increasingly more difficult.  He does not endorse any headache or neck pain no nausea vomiting diarrhea no hip pain or back pain.  Aside from baby aspirin he denies being on any other blood thinner medication.  On exam, patient is resting comfortably appears to be in no acute discomfort.  Exam is remarkable for tenderness to his right inferior anterolateral chest wall without any crepitus or emphysema.  Abdominal exam overall reassuring no significant reproducible tenderness when palpating.  Given mechanism of injury, x-ray of right chest wall have been ordered.    -Labs considered but not ordered -The patient was maintained on a cardiac monitor.  I personally viewed and interpreted the cardiac monitored which showed an underlying rhythm of: sinus bradycardia -Imaging independently viewed and interpreted by me and I agree with radiologist's interpretation.  Result remarkable for CXR showing minimally displaced 6th and 7th ribs fx. -This patient presents to the ED for concern of fall, this involves an extensive number of treatment options, and is a complaint that carries with it a high risk of complications and morbidity.  The differential diagnosis includes fx, dislocation, strain, sprain, contusion, PTX, PNA -Co morbidities that complicate the patient evaluation includes ILD, HTN -Treatment includes tramadol, incentive spirometer -Reevaluation of the patient after these medicines showed that the patient  improved -PCP office notes or outside notes reviewed -Discussion with attending Dr. Eloise Harman -Escalation to admission/observation considered: patients  feels much better, is comfortable with discharge, and will follow up with PCP -Prescription medication considered, patient comfortable with tramadol, incentive spirometer -Social Determinant of Health considered which includes health literacy  10:25 AM X-ray demonstrate a acute minimally displaced sixth and seventh ribs on the right side causing patient discomfort.  This is consistent with patient's presentation.  No evidence of pneumothorax.  I also have low suspicion for acute intra-abdominal injury.  Will provide patient with a definitive fracture care and return precaution.        Final Clinical Impression(s) / ED Diagnoses Final diagnoses:  Multiple fractures of ribs, right side, initial encounter for closed fracture    Rx / DC Orders ED Discharge Orders          Ordered    traMADol (ULTRAM) 50 MG tablet  Every 6 hours PRN        01/04/23 1029              Fayrene Helper, PA-C 01/04/23 1101    Rondel Baton, MD 01/04/23 1955

## 2023-01-04 NOTE — ED Triage Notes (Addendum)
Pt. Stated, I fell on Monday due to some dirt wiped out in the [place]. When I fell it was on my right side, I have pulmonary fibrosis of the lung and this has made it a lot worse and hard to breath until I get it cleared out. Daniel Herrera been having problems catching my breath.

## 2023-01-08 ENCOUNTER — Emergency Department (HOSPITAL_COMMUNITY)
Admission: EM | Admit: 2023-01-08 | Discharge: 2023-01-09 | Disposition: A | Discharge status: Home / Self Care | Payer: Medicare Other | Attending: Emergency Medicine | Admitting: Emergency Medicine

## 2023-01-08 ENCOUNTER — Encounter (HOSPITAL_COMMUNITY): Payer: Self-pay

## 2023-01-08 ENCOUNTER — Emergency Department (HOSPITAL_COMMUNITY): Payer: Medicare Other

## 2023-01-08 DIAGNOSIS — Z7982 Long term (current) use of aspirin: Secondary | ICD-10-CM | POA: Diagnosis not present

## 2023-01-08 DIAGNOSIS — I2510 Atherosclerotic heart disease of native coronary artery without angina pectoris: Secondary | ICD-10-CM | POA: Diagnosis not present

## 2023-01-08 DIAGNOSIS — J40 Bronchitis, not specified as acute or chronic: Secondary | ICD-10-CM | POA: Diagnosis not present

## 2023-01-08 DIAGNOSIS — R002 Palpitations: Secondary | ICD-10-CM | POA: Insufficient documentation

## 2023-01-08 DIAGNOSIS — I480 Paroxysmal atrial fibrillation: Secondary | ICD-10-CM | POA: Diagnosis not present

## 2023-01-08 DIAGNOSIS — Z8673 Personal history of transient ischemic attack (TIA), and cerebral infarction without residual deficits: Secondary | ICD-10-CM | POA: Diagnosis not present

## 2023-01-08 DIAGNOSIS — Z951 Presence of aortocoronary bypass graft: Secondary | ICD-10-CM | POA: Insufficient documentation

## 2023-01-08 DIAGNOSIS — S42002A Fracture of unspecified part of left clavicle, initial encounter for closed fracture: Secondary | ICD-10-CM | POA: Diagnosis not present

## 2023-01-08 DIAGNOSIS — Z955 Presence of coronary angioplasty implant and graft: Secondary | ICD-10-CM | POA: Insufficient documentation

## 2023-01-08 DIAGNOSIS — S2241XA Multiple fractures of ribs, right side, initial encounter for closed fracture: Secondary | ICD-10-CM | POA: Diagnosis not present

## 2023-01-08 DIAGNOSIS — R079 Chest pain, unspecified: Secondary | ICD-10-CM | POA: Diagnosis not present

## 2023-01-08 LAB — BASIC METABOLIC PANEL
Anion gap: 13 (ref 5–15)
BUN: 21 mg/dL (ref 8–23)
CO2: 23 mmol/L (ref 22–32)
Calcium: 9.1 mg/dL (ref 8.9–10.3)
Chloride: 104 mmol/L (ref 98–111)
Creatinine, Ser: 1.84 mg/dL — ABNORMAL HIGH (ref 0.61–1.24)
GFR, Estimated: 36 mL/min — ABNORMAL LOW (ref >60)
Glucose, Bld: 141 mg/dL — ABNORMAL HIGH (ref 70–99)
Potassium: 4 mmol/L (ref 3.5–5.1)
Sodium: 140 mmol/L (ref 135–145)

## 2023-01-08 LAB — CBC
HCT: 40.6 % (ref 39.0–52.0)
Hemoglobin: 13.8 g/dL (ref 13.0–17.0)
MCH: 31.9 pg (ref 26.0–34.0)
MCHC: 34 g/dL (ref 30.0–36.0)
MCV: 94 fL (ref 80.0–100.0)
Platelets: 157 10*3/uL (ref 150–400)
RBC: 4.32 MIL/uL (ref 4.22–5.81)
RDW: 12.5 % (ref 11.5–15.5)
WBC: 5.4 10*3/uL (ref 4.0–10.5)
nRBC: 0 % (ref 0.0–0.2)

## 2023-01-08 LAB — TROPONIN I (HIGH SENSITIVITY): Troponin I (High Sensitivity): 12 ng/L (ref <18)

## 2023-01-08 NOTE — ED Triage Notes (Signed)
Pt to ED with complaint of heart fluttering that began approx 3 hours ago while walking up stairs. Reports HR continues to elevate intermittently. Denies CP or SHOB.

## 2023-01-08 NOTE — ED Provider Triage Note (Signed)
Emergency Medicine Provider Triage Evaluation Note  JAAN FISCHEL , a 82 y.o. male  was evaluated in triage.  Pt complains of palpitations. Patient reports that he was working at home when he felt his heart start to flutter. He took a nitroglycerin with relief of symptoms. Denies CP or SOB.  Review of Systems  Positive: palpitations Negative: SOB  Physical Exam  BP (!) 159/118 (BP Location: Left Arm)   Pulse 70   Temp 98.1 F (36.7 C) (Oral)   Resp 16   SpO2 98%  Gen:   Awake, no distress   Resp:  Normal effort  MSK:   Moves extremities without difficulty  Other:    Medical Decision Making  Medically screening exam initiated at 9:59 PM.  Appropriate orders placed.  ROBEY MASSMANN was informed that the remainder of the evaluation will be completed by another provider, this initial triage assessment does not replace that evaluation, and the importance of remaining in the ED until their evaluation is complete.   Maxwell Marion, PA-C 01/08/23 2200

## 2023-01-09 LAB — TROPONIN I (HIGH SENSITIVITY)
Troponin I (High Sensitivity): 14 ng/L (ref <18)
Troponin I (High Sensitivity): 13 ng/L (ref <18)

## 2023-01-09 LAB — BRAIN NATRIURETIC PEPTIDE: B Natriuretic Peptide: 246.7 pg/mL — ABNORMAL HIGH (ref 0.0–100.0)

## 2023-01-09 NOTE — ED Provider Notes (Signed)
[place] EMERGENCY DEPARTMENT AT [facility] Provider Note   CSN: [id number] Arrival date & time: 01/08/23  2024     History  Chief Complaint  Patient presents with   Tachycardia    Daniel Herrera is a 82 y.o. male.  82 year old male presenting to the ED status post feeling palpitations around 5 PM 10/7.  During the event he had no shortness of breath or chest pain.  He had a pulse ox at home which showed his heart rate to be in the 130s to 140s.  As he was waiting for his daughter to come and take him to the emergency room he reported his pulse improved back down to the 70s to 80s.  He has had no repeat palpitations since being in the emergency room.  Past medical history significant for coronary artery disease, CABG, stroke, paroxysmal A-fib.  He reports he is currently on metoprolol 25 mg twice daily and he took his last dose on 10/7 in the morning.  He denies history of congestive heart failure.  He follows with cardiologist outpatient.  The history is provided by the patient. No language interpreter was used.       Home Medications Prior to Admission medications   Medication Sig Start Date End Date Taking? Authorizing Provider  amLODipine (NORVASC) 2.5 MG tablet Take 2.5 mg by mouth daily. 09/02/22   [provider]  aspirin 81 MG EC tablet Take 1 tablet (81 mg total) by mouth daily. 07/07/21   Anson Fret, MD  Azelastine HCl 137 MCG/SPRAY SOLN Place 1 spray into both nostrils 2 (two) times daily. 02/05/21   [provider]  betamethasone valerate ointment (VALISONE) 0.1 % Apply 1 application topically 2 (two) times daily as needed for rash. 01/18/21   [provider]  busPIRone (BUSPAR) 5 MG tablet Take 5 mg by mouth 3 (three) times daily. 06/01/22   [provider]  Cyanocobalamin (VITAMIN B-12) 5000 MCG TBDP Take 5,000 mcg by mouth daily.     [provider]  dextromethorphan-guaiFENesin (MUCINEX DM) 30-600 MG 12hr  tablet Take 1 tablet by mouth in the morning and at bedtime.    [provider]  EPINEPHRINE 0.3 mg/0.3 mL IJ SOAJ injection INJECT AS DIRECTED FOR LIFE-THREATHENING ALLERGIC REACTION 09/09/21   Kozlow, Alvira Philips, MD  fluoruracil [facility]) 0.5 % cream Apply to skin cancer for 1-7 days during 'flare up' as directed. 03/09/22   Kozlow, Alvira Philips, MD  fluticasone (FLONASE) 50 MCG/ACT nasal spray Place 1 spray into both nostrils 2 (two) times daily.    [provider]  glipiZIDE (GLUCOTROL XL) 2.5 MG 24 hr tablet Take 2.5 mg by mouth every morning. 10/21/20   [provider]  hydrALAZINE (APRESOLINE) 10 MG tablet Take 1 tablet (10 mg total) by mouth 3 (three) times daily as needed for up to 10 doses. Please take for systolic blood pressure >180 05/27/22   Florene Route, MD  icatibant Acetate [facility]) 30 MG/3ML injection Inject 3 mLs (30 mg total) into the skin as needed. 03/21/22   Kozlow, Alvira Philips, MD  levothyroxine (SYNTHROID) 100 MCG tablet Take 100 mcg by mouth daily before breakfast.    [provider]  losartan (COZAAR) 25 MG tablet Take 1 tablet (25 mg total) by mouth daily. 09/28/22   Revankar, Aundra Dubin, MD  magic mouthwash SOLN Take 15 mLs by mouth 3 (three) times daily as needed for mouth pain.    [provider]  metoprolol  succinate (TOPROL-XL) 25 MG 24 hr tablet Take 1 tablet (25 mg total) by mouth 2 (two) times daily. 06/06/22   Revankar, Aundra Dubin, MD  nabumetone (RELAFEN) 750 MG tablet Take 750 mg by mouth 2 (two) times daily. 08/09/19   [provider]  nitroGLYCERIN (NITROSTAT) 0.4 MG SL tablet Place 0.4 mg under the tongue every 5 (five) minutes as needed for chest pain.    [provider]  OVER THE COUNTER MEDICATION Take 1 capsule by mouth in the morning and at bedtime. Health Plus/Super Colon Cleanse    [provider]  pantoprazole (PROTONIX) 40 MG tablet Take 40 mg by mouth 2 (two) times daily.    [provider]   Pitavastatin Calcium (LIVALO) 4 MG TABS Take 4 mg by mouth every evening.    [provider]  Probiotic Product (PROBIOTIC DAILY PO) Take 1 capsule by mouth daily.    [provider]  TAKHZYRO 300 MG/2ML SOSY INJECT 300MG  SUBCUTANEOUSLY  EVERY 2 WEEKS 07/17/22   Kozlow, Alvira Philips, MD  temazepam (RESTORIL) 15 MG capsule Take 15 mg by mouth at bedtime. 06/01/22   [provider]  traMADol (ULTRAM) 50 MG tablet Take 1 tablet (50 mg total) by mouth every 6 (six) hours as needed. 01/04/23   Fayrene Helper, PA-C      Allergies    Lisinopril, Amlodipine, Oxycodone, Tape, Benadryl [diphenhydramine hcl], Darvocet [propoxyphene n-acetaminophen], Darvon [propoxyphene hcl], and Tylenol [acetaminophen]    Review of Systems   Review of Systems  Constitutional:  Negative for activity change, appetite change, diaphoresis, fatigue, fever and unexpected weight change.  Respiratory:  Negative for apnea, chest tightness and shortness of breath.   Cardiovascular:  Positive for palpitations. Negative for chest pain and leg swelling.  Gastrointestinal:  Negative for abdominal distention, abdominal pain, nausea and vomiting.  Skin:  Negative for color change and pallor.  Neurological:  Negative for dizziness, syncope, weakness and light-headedness.  Psychiatric/Behavioral:  Negative for confusion.     Physical Exam Updated Vital Signs BP (!) 163/81   Pulse (!) 58   Temp (!) 97.5 F (36.4 C)   Resp 17   SpO2 100%  Physical Exam Constitutional:      General: He is not in acute distress.    Appearance: Normal appearance. He is normal weight. He is not ill-appearing.  HENT:     Head: Normocephalic and atraumatic.     Nose: Nose normal.     Mouth/Throat:     Mouth: Mucous membranes are moist.  Eyes:     Extraocular Movements: Extraocular movements intact.     Conjunctiva/sclera: Conjunctivae normal.     Pupils: Pupils are equal, round, and reactive to light.  Cardiovascular:      Rate and Rhythm: Normal rate and regular rhythm.     Pulses: Normal pulses.     Heart sounds: Normal heart sounds.  Pulmonary:     Effort: Pulmonary effort is normal.     Breath sounds: Normal breath sounds.  Abdominal:     General: Abdomen is flat. Bowel sounds are normal.     Palpations: Abdomen is soft.  Musculoskeletal:     Cervical back: Normal range of motion and neck supple.  Skin:    General: Skin is warm.     Capillary Refill: Capillary refill takes less than 2 seconds.  Neurological:     General: No focal deficit present.     Mental Status: He is alert and oriented to person, place,  and time. Mental status is at baseline.  Psychiatric:        Mood and Affect: Mood normal.        Behavior: Behavior normal.        Thought Content: Thought content normal.        Judgment: Judgment normal.     ED Results / Procedures / Treatments   Labs (all labs ordered are listed, but only abnormal results are displayed) Labs Reviewed  BASIC METABOLIC PANEL - Abnormal; Notable for the following components:      Result Value   Glucose, Bld 141 (*)    Creatinine, Ser 1.84 (*)    GFR, Estimated 36 (*)    All other components within normal limits  BRAIN NATRIURETIC PEPTIDE - Abnormal; Notable for the following components:   B Natriuretic Peptide 246.7 (*)    All other components within normal limits  CBC  TROPONIN I (HIGH SENSITIVITY)  TROPONIN I (HIGH SENSITIVITY)  TROPONIN I (HIGH SENSITIVITY)  TROPONIN I (HIGH SENSITIVITY)    EKG EKG Interpretation Date/Time:  Monday January 08 2023 21:53:04 EDT Ventricular Rate:  67 PR Interval:  264 QRS Duration:  96 QT Interval:  406 QTC Calculation: 429 R Axis:   -34  Text Interpretation: Sinus rhythm with 1st degree A-V block Left axis deviation Left ventricular hypertrophy ( R in aVL , Cornell product , Romhilt-Estes ) Abnormal ECG When compared with ECG of 03-Sep-2022 12:38, PREVIOUS ECG IS PRESENT No significant change since last  tracing Confirmed by Jacalyn Lefevre [phone]) on 01/09/2023 7:29:17 AM  Radiology DG Chest 2 View  Result Date: 01/08/2023 CLINICAL DATA:  Chest pain. EXAM: CHEST - 2 VIEW COMPARISON:  Radiograph dated 01/04/2023. FINDINGS: There is diffuse chronic intra coarsening and bronchitic changes. No focal consolidation, pleural effusion, or pneumothorax. The cardiac silhouette is within limits. Median sternotomy wires and postsurgical changes of CABG. The right rib fractures seen on the prior rib series are less conspicuous on today's exam. 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 IMPRESSION: 1. No active cardiopulmonary disease. 2. Chronic lung changes. Electronically Signed   By: Elgie Collard M.D.   On: 01/08/2023 23:33    Procedures Procedures    Medications Ordered in ED Medications - No data to display  ED Course/ Medical Decision Making/ A&P                                 Medical Decision Making 82 year old male presenting with palpitations.  On arrival vital signs significant for hypertension to 173/75 and pulse of 59.  On physical exam he is clinically well-appearing in no acute distress with RRR.  On arrival EKG within normal limits showing normal sinus rhythm.  Troponins, BMP, CBC all within normal limits.  Differential for this patient includes paroxysmal A-fib, ACS, congestive heart failure, PE, pneumonia.  Unlikely to be infectious is chest x-ray is within normal limits he is afebrile with a normal white blood cell count.  Last echocardiogram done in 2023 showing normal LVEF.  Will obtain BNP.  Most likely diagnosis is paroxysmal A-fib with his history.  Will recheck EKG and troponin to rule out ACS with his extensive history of CAD and CABG.  On reassessment, repeat troponin and BNP stable.  Patient will need to follow-up with cardiology outpatient for most likely paroxysmal A-fib.  Currently he is not anticoagulated only on aspirin.  Will need goals of care conversation outpatient  to  determine if he is eligible for anticoagulation with possible A-fib versus long-term cardiac monitoring outpatient.  Patient in agreement with plan and will call cardiologist schedule appointment.  Amount and/or Complexity of Data Reviewed External Data Reviewed: notes. Labs: ordered. Radiology: ordered. Decision-making details documented in ED Course. ECG/medicine tests: ordered. Decision-making details documented in ED Course.          Final Clinical Impression(s) / ED Diagnoses Final diagnoses:  Palpitation    Rx / DC Orders ED Discharge Orders     None         Glendale Chard, DO 01/09/23 1610    Jacalyn Lefevre, MD 01/09/23 [phone]

## 2023-01-09 NOTE — ED Notes (Signed)
Pt with family members, understanding of wait time.

## 2023-01-09 NOTE — Discharge Instructions (Addendum)
Please call and schedule an appointment to see your cardiologist outpatient.

## 2023-01-11 ENCOUNTER — Ambulatory Visit: Payer: Medicare Other | Attending: Cardiology | Admitting: Cardiology

## 2023-01-11 ENCOUNTER — Encounter: Payer: Self-pay | Admitting: Cardiology

## 2023-01-11 ENCOUNTER — Ambulatory Visit: Payer: Medicare Other

## 2023-01-11 VITALS — BP 136/78 | HR 68 | Ht 73.0 in | Wt 196.6 lb

## 2023-01-11 DIAGNOSIS — R002 Palpitations: Secondary | ICD-10-CM

## 2023-01-11 DIAGNOSIS — E118 Type 2 diabetes mellitus with unspecified complications: Secondary | ICD-10-CM | POA: Diagnosis not present

## 2023-01-11 DIAGNOSIS — I2510 Atherosclerotic heart disease of native coronary artery without angina pectoris: Secondary | ICD-10-CM

## 2023-01-11 DIAGNOSIS — Z9861 Coronary angioplasty status: Secondary | ICD-10-CM

## 2023-01-11 DIAGNOSIS — N183 Chronic kidney disease, stage 3 unspecified: Secondary | ICD-10-CM

## 2023-01-11 DIAGNOSIS — I10 Essential (primary) hypertension: Secondary | ICD-10-CM

## 2023-01-11 DIAGNOSIS — Z951 Presence of aortocoronary bypass graft: Secondary | ICD-10-CM | POA: Diagnosis not present

## 2023-01-11 NOTE — Progress Notes (Signed)
Cardiology Office Note:    Date:  01/11/2023   ID:  Daniel Herrera, DOB October 26, 1940, MRN [id number]  PCP:  Hurshel Party, NP  Cardiologist:  Garwin Brothers, MD   Referring MD: Hurshel Party, NP    ASSESSMENT:    1. Palpitations   2. CAD S/P percutaneous coronary angioplasty   3. Essential hypertension   4. Type 2 diabetes mellitus with unspecified complications (HCC)   5. Stage 3 chronic kidney disease, unspecified whether stage 3a or 3b CKD (HCC)   6. S/P CABG x 3    PLAN:    In order of problems listed above:  Coronary artery disease: Secondary prevention stressed to the patient.  Importance of compliance with diet medication stressed and she vocalized understanding.  Patient was advised to walk on a regular basis and he tells me that he does so regularly.  It appears age-appropriate. Palpitations: Overall appear benign.  Will do a 1 month monitor to assess this.  He is agreeable. Essential hypertension: Blood pressure stable and diet was emphasized lifestyle modification urged. Mixed dyslipidemia: On lipid-lowering medications followed by primary care. Diabetes mellitus: Diet emphasized.  He is doing well with this.  Followed by primary care. Patient will be seen in follow-up appointment in 6 months or earlier if the patient has any concerns.    Medication Adjustments/Labs and Tests Ordered: Current medicines are reviewed at length with the patient today.  Concerns regarding medicines are outlined above.  Orders Placed This Encounter  Procedures   EKG 12-Lead   No orders of the defined types were placed in this encounter.    No chief complaint on file.    History of Present Illness:    Daniel Herrera is a 82 y.o. male.  Patient has past medical history of coronary artery disease post CABG surgery, essential hypertension, mixed dyslipidemia and diabetes mellitus.  He denies any problems at this time and takes care of activities of daily living.  No chest pain  orthopnea or PND.  He had palpitations and therefore went to the ER.  The findings were unremarkable.  I reviewed those records extensively.  Past Medical History:  Diagnosis Date   Abnormal EKG 02/21/2015   Acute lower GI bleeding 05/28/2019   Anginal pain (HCC) 2016   Angio-edema    Anxiety    Arthritis    CAD S/P percutaneous coronary angioplasty    NSTEMI treated with LAD PCI with DES x 03 Feb 2015 Myoview low risk Nov 2017   Cataract    Cerebellar stroke (HCC) 06/14/2021   CHF (congestive heart failure) (HCC) 2020   after surgery only   Chronic diarrhea of unknown origin    CKD (chronic kidney disease) stage 3, GFR 30-59 ml/min 12/21/2017   Colitis 05/23/2015   Coronary artery disease 05/28/2019   Diverticular hemorrhage    Diverticulosis    Dyslipidemia, goal LDL below 70 02/21/2015   hyperlipidemia    Dysrhythmia    A-fib   Enterotoxigenic Escherichia coli infection 05/24/2015   Essential hypertension 02/21/2015   Geographic tongue    GERD (gastroesophageal reflux disease)    GERD without esophagitis 06/15/2021   GI bleed 12/21/2017   Glossitis, benign migratory 12/04/2018   Gout    Hematochezia 12/21/2017   Admitted Sept 2019 with lower GI bleeding felt to be secondary to hemorrhoids   Hereditary angioedema type 2 (HCC) 07/28/2017   High cholesterol    History of kidney stones    History  of non-ST elevation myocardial infarction (NSTEMI) 02/22/2015   Nov 2016   History of prolonged Q-T interval on ECG    History of prostate cancer    Hoarseness 12/04/2018   Hx of Clostridium difficile infection    Hx of umbilical hernia repair    Hypertension    Hypothyroidism    Hypothyroidism, unspecified 06/15/2021   ILD (interstitial lung disease) (HCC) 12/17/2013   ?related to chicken farm exposure or aspiration pneumonitis from GERD Previously attributed to acute interstitial pneumonitis but based on HRCT this is likely IPF, "probable UIP"   Lower GI bleed 06/03/2019    Malignant neoplasm of prostate (HCC) 08/07/2014   Myocardial infarction (HCC) 2016   NSTEMI (non-ST elevated myocardial infarction) (HCC) 05/21/2018   Open metatarsal fracture 09/02/2020   PAF (paroxysmal atrial fibrillation) (HCC) 06/13/2018   Post CABG- discharged on Amiodarone, he will probably not need this long term   Palpitations 03/15/2015   palpitations    Peripheral vascular disease (HCC)    vericose veins   Personal history of digestive disease    gastric ulcer   Pre-diabetes    Pre-operative clearance 01/02/2018   Prostate cancer (HCC)    Prostate enlargement    S/P CABG x 3 05/28/2018    LIMA-LAD, RIMA-RCA, and Lt radial to OM.   Skin cancer    basal cell carcinoma   STEC (Shiga toxin-producing Escherichia coli) infection 05/24/2015   Stroke (cerebrum) (HCC) 06/15/2021   Tachycardia determined by examination of pulse    Thyroid disease    Type 2 diabetes mellitus with unspecified complications (HCC)    Type 2 diabetes mellitus without complications (HCC) 06/15/2021   Urticaria     Past Surgical History:  Procedure Laterality Date   APPENDECTOMY     BACK SURGERY     CARDIAC CATHETERIZATION N/A 02/21/2015   Procedure: Left Heart Cath;  Surgeon: Runell Gess, MD;  Location[facility] INVASIVE CV LAB;  Service: Cardiovascular;  Laterality: N/A;   CATARACT EXTRACTION, BILATERAL     CORONARY ARTERY BYPASS GRAFT N/A 05/24/2018   Procedure: CORONARY ARTERY BYPASS GRAFTING (CABG), ON PUMP, TIMES THREE, USING BILATERAL INTERNAL MAMMARY ARTERY AND HARVESTED LEFT RADIAL ARTERY;  Surgeon: Alleen Borne, MD;  Location: [facility] OR;  Service: Open Heart Surgery;  Laterality: N/A;   CYSTOSCOPY WITH LITHOLAPAXY N/A 07/26/2020   Procedure: CYSTOSCOPY WITH LITHOLAPAXY WITH FULGERATION;  Surgeon: Heloise Purpura, MD;  Location: [facility] ORS;  Service: Urology;  Laterality: N/A;   HOLMIUM LASER APPLICATION N/A 07/26/2020   Procedure: HOLMIUM LASER APPLICATION;  Surgeon: Heloise Purpura, MD;  Location:  [facility] ORS;  Service: Urology;  Laterality: N/A;   I & D EXTREMITY Right 09/02/2020   Procedure: IRRIGATION AND DEBRIDEMENT RIGHT 2ND AND 3RD METATARSAL;  Surgeon: Toni Arthurs, MD;  Location: [facility] OR;  Service: Orthopedics;  Laterality: Right;   LEFT HEART CATH AND CORONARY ANGIOGRAPHY N/A 05/22/2018   Procedure: LEFT HEART CATH AND CORONARY ANGIOGRAPHY;  Surgeon: Corky Crafts, MD;  Location: [facility] INVASIVE CV LAB;  Service: Cardiovascular;  Laterality: N/A;   NASAL SINUS SURGERY     ORIF TOE FRACTURE Right 09/02/2020   Procedure: OPEN REDUCTION INTERNAL FIXATION (ORIF) OF RIGHT OPEN 2ND AND 3RD METATARSAL (TOE) OPEN FRACTURE;  Surgeon: Toni Arthurs, MD;  Location: [facility] OR;  Service: Orthopedics;  Laterality: Right;   PROSTATE BIOPSY     RADIAL ARTERY HARVEST Left 05/24/2018   Procedure: RADIAL ARTERY HARVEST;  Surgeon: Alleen Borne, MD;  Location: [facility] OR;  Service: Open Heart Surgery;  Laterality: Left;   right shoulder rotator cuff repair     TEE WITHOUT CARDIOVERSION N/A 05/24/2018   Procedure: TRANSESOPHAGEAL ECHOCARDIOGRAM (TEE);  Surgeon: Alleen Borne, MD;  Location: [facility] OR;  Service: Open Heart Surgery;  Laterality: N/A;    Current Medications: Current Meds  Medication Sig   amLODipine (NORVASC) 2.5 MG tablet Take 2.5 mg by mouth daily.   aspirin 81 MG EC tablet Take 1 tablet (81 mg total) by mouth daily.   Azelastine HCl 137 MCG/SPRAY SOLN Place 1 spray into both nostrils 2 (two) times daily.   betamethasone valerate ointment (VALISONE) 0.1 % Apply 1 application topically 2 (two) times daily as needed for rash.   Cyanocobalamin (VITAMIN B-12) 5000 MCG TBDP Take 5,000 mcg by mouth daily.    dextromethorphan-guaiFENesin (MUCINEX DM) 30-600 MG 12hr tablet Take 1 tablet by mouth in the morning and at bedtime.   EPINEPHRINE 0.3 mg/0.3 mL IJ SOAJ injection INJECT AS DIRECTED FOR LIFE-THREATHENING ALLERGIC REACTION   fluoruracil (CARAC) 0.5 % cream Apply to skin cancer for 1-7 days during  'flare up' as directed.   fluticasone (FLONASE) 50 MCG/ACT nasal spray Place 1 spray into both nostrils 2 (two) times daily.   icatibant Acetate (FIRAZYR) 30 MG/3ML injection Inject 3 mLs (30 mg total) into the skin as needed.   levothyroxine (SYNTHROID) 100 MCG tablet Take 100 mcg by mouth daily before breakfast.   losartan (COZAAR) 25 MG tablet Take 1 tablet (25 mg total) by mouth daily.   magic mouthwash SOLN Take 15 mLs by mouth 3 (three) times daily as needed for mouth pain.   metoprolol succinate (TOPROL-XL) 25 MG 24 hr tablet Take 1 tablet (25 mg total) by mouth 2 (two) times daily.   nabumetone (RELAFEN) 750 MG tablet Take 750 mg by mouth 2 (two) times daily.   nitroGLYCERIN (NITROSTAT) 0.4 MG SL tablet Place 0.4 mg under the tongue every 5 (five) minutes as needed for chest pain.   OVER THE COUNTER MEDICATION Take 1 capsule by mouth in the morning and at bedtime. Health Plus/Super Colon Cleanse   pantoprazole (PROTONIX) 40 MG tablet Take 40 mg by mouth 2 (two) times daily.   Pitavastatin Calcium (LIVALO) 4 MG TABS Take 4 mg by mouth every evening.   Probiotic Product (PROBIOTIC DAILY PO) Take 1 capsule by mouth daily.   TAKHZYRO 300 MG/2ML SOSY INJECT 300MG  SUBCUTANEOUSLY  EVERY 2 WEEKS   temazepam (RESTORIL) 15 MG capsule Take 15 mg by mouth at bedtime.     Allergies:   Lisinopril, Amlodipine, Oxycodone, Tape, Benadryl [diphenhydramine hcl], Darvocet [propoxyphene n-acetaminophen], Darvon [propoxyphene hcl], and Tylenol [acetaminophen]   Social History   Socioeconomic History   Marital status: Married    Spouse name: Not on file   Number of children: Not on file   Years of education: Not on file   Highest education level: Not on file  Occupational History   Occupation: retired  Tobacco Use   Smoking status: Never   Smokeless tobacco: Never  Vaping Use   Vaping status: Never Used  Substance and Sexual Activity   Alcohol use: Never   Drug use: No   Sexual activity: Not on  file  Other Topics Concern   Not on file  Social History Narrative   Not on file   Social Determinants of Health   Financial Resource Strain: Not on file  Food Insecurity: No Food Insecurity (06/02/2019)   Hunger Vital Sign  Worried About Programme researcher, broadcasting/film/video in the Last Year: Never true    Ran Out of Food in the Last Year: Never true  Transportation Needs: No Transportation Needs (06/02/2019)   PRAPARE - Administrator, Civil Service (Medical): No    Lack of Transportation (Non-Medical): No  Physical Activity: Inactive (09/30/2018)   Received from [facility], [facility]   Exercise Vital Sign    Days of Exercise per Week: 0 days    Minutes of Exercise per Session: 0 min  Stress: No Stress Concern Present (09/30/2018)   Received from [facility], [facility] of Occupational Health - Occupational Stress Questionnaire    Feeling of Stress : Not at all  Social Connections: Socially Integrated (09/30/2018)   Received from [facility], [facility]   Social Connection and Isolation Panel [NHANES]    Frequency of Communication with Friends and Family: More than three times a week    Frequency of Social Gatherings with Friends and Family: More than three times a week    Attends Religious Services: More than 4 times per year    Active Member of [employer] or Organizations: Yes    Attends Banker Meetings: 1 to 4 times per year    Marital Status: Married     Family History: The patient's family history includes Cancer in his father, mother, and sister; Stomach cancer in his mother. There is no history of Sleep apnea.  ROS:   Please see the history of present illness.    All other systems reviewed and are negative.  EKGs/Labs/Other Studies Reviewed:    The following studies were reviewed today: .Marland KitchenEKG Interpretation Date/Time:  Thursday January 11 2023 15:25:45 EDT Ventricular Rate:  68 PR Interval:  248 QRS  Duration:  102 QT Interval:  408 QTC Calculation: 433 R Axis:   -21  Text Interpretation: Sinus rhythm with 1st degree A-V block with occasional Premature ventricular complexes Left ventricular hypertrophy Abnormal ECG When compared with ECG of 08-Jan-2023 21:53, Premature ventricular complexes are now Present Confirmed by Belva Crome [phone]) on 01/11/2023 3:56:58 PM     Recent Labs: 05/27/2022: ALT 22 09/03/2022: Magnesium 2.0; TSH 2.610 01/08/2023: BUN 21; Creatinine, Ser 1.84; Hemoglobin 13.8; Platelets 157; Potassium 4.0; Sodium 140 01/09/2023: B Natriuretic Peptide 246.7  Recent Lipid Panel    Component Value Date/Time   CHOL 156 06/15/2021 0308   CHOL 183 05/23/2019 0826   TRIG 180 (H) 06/15/2021 0308   HDL 30 (L) 06/15/2021 0308   HDL 44 05/23/2019 0826   CHOLHDL 5.2 06/15/2021 0308   VLDL 36 06/15/2021 0308   LDLCALC 90 06/15/2021 0308   LDLCALC 108 (H) 05/23/2019 0826    Physical Exam:    VS:  BP 136/78   Pulse 68   Ht 6\' 1"  (1.854 m)   Wt 196 lb 9.6 oz (89.2 kg)   SpO2 96%   BMI 25.94 kg/m     Wt Readings from Last 3 Encounters:  01/11/23 196 lb 9.6 oz (89.2 kg)  01/04/23 190 lb (86.2 kg)  09/21/22 194 lb (88 kg)     GEN: Patient is in no acute distress HEENT: Normal NECK: No JVD; No carotid bruits LYMPHATICS: No lymphadenopathy CARDIAC: Hear sounds regular, 2/6 systolic murmur at the apex. RESPIRATORY:  Clear to auscultation without rales, wheezing or rhonchi  ABDOMEN: Soft, non-tender, non-distended MUSCULOSKELETAL:  No edema; No deformity  SKIN: Warm and dry  NEUROLOGIC:  Alert and oriented x 3 PSYCHIATRIC:  Normal affect   Signed, Garwin Brothers, MD  01/11/2023 4:11 PM    [facility]

## 2023-01-11 NOTE — Patient Instructions (Addendum)
Medication Instructions:  Your physician recommends that you continue on your current medications as directed. Please refer to the Current Medication list given to you today.  *If you need a refill on your cardiac medications before your next appointment, please call your pharmacy*   Lab Work: None ordered If you have labs (blood work) drawn today and your tests are completely normal, you will receive your results only by: MyChart Message (if you have MyChart) OR A paper copy in the mail If you have any lab test that is abnormal or we need to change your treatment, we will call you to review the results.   Testing/Procedures: A zio monitor was ordered today. It will remain on for 14 days. Remove 01/25/23. You will then return monitor and event diary in provided box. It takes 1-2 weeks for report to be downloaded and returned to [place]. We will call you with the results. If monitor falls off or has orange flashing light, please call Zio for further instructions.    Follow-Up: At [facility], you and your health needs are our priority.  As part of our continuing mission to provide you with exceptional heart care, we have created designated Provider Care Teams.  These Care Teams include your primary Cardiologist (physician) and Advanced Practice Providers (APPs -  Physician Assistants and Nurse Practitioners) who all work together to provide you with the care you need, when you need it.  We recommend signing up for the patient portal called "MyChart".  Sign up information is provided on this After Visit Summary.  MyChart is used to connect with patients for Virtual Visits (Telemedicine).  Patients are able to view lab/test results, encounter notes, upcoming appointments, etc.  Non-urgent messages can be sent to your provider as well.   To learn more about what you can do with MyChart, go to [email or web address].    Your next appointment:   9 month(s)  The format for your next  appointment:   In Person  Provider:   Belva Crome, MD    Other Instructions none  Important Information About Sugar

## 2023-01-12 ENCOUNTER — Telehealth: Payer: Self-pay | Admitting: Pulmonary Disease

## 2023-01-12 NOTE — Telephone Encounter (Signed)
PT states he needs to resched the CT . Prefers the 25th-28th. Please call PT at [phone]

## 2023-01-15 NOTE — Telephone Encounter (Signed)
Spoke to the patient and provided him with [facility] scheduling number. Patient states that he is currently wearing a heart monitor and has been talking with the people at [facility] about when would be best date to do scan. Patient will call me back to schedule follow up with Dr Vassie Loll after .

## 2023-01-17 ENCOUNTER — Ambulatory Visit: Payer: Medicare Other | Admitting: Allergy and Immunology

## 2023-01-23 DIAGNOSIS — I700 Atherosclerosis of aorta: Secondary | ICD-10-CM | POA: Diagnosis not present

## 2023-01-23 DIAGNOSIS — I2510 Atherosclerotic heart disease of native coronary artery without angina pectoris: Secondary | ICD-10-CM | POA: Diagnosis not present

## 2023-01-23 DIAGNOSIS — K8020 Calculus of gallbladder without cholecystitis without obstruction: Secondary | ICD-10-CM | POA: Diagnosis not present

## 2023-01-23 DIAGNOSIS — J849 Interstitial pulmonary disease, unspecified: Secondary | ICD-10-CM | POA: Diagnosis not present

## 2023-01-23 DIAGNOSIS — J8410 Pulmonary fibrosis, unspecified: Secondary | ICD-10-CM | POA: Diagnosis not present

## 2023-01-29 ENCOUNTER — Encounter: Payer: Self-pay | Admitting: Allergy and Immunology

## 2023-01-29 ENCOUNTER — Ambulatory Visit (INDEPENDENT_AMBULATORY_CARE_PROVIDER_SITE_OTHER): Payer: Medicare Other | Admitting: Allergy and Immunology

## 2023-01-29 VITALS — BP 136/72 | HR 65 | Resp 16

## 2023-01-29 DIAGNOSIS — R002 Palpitations: Secondary | ICD-10-CM | POA: Diagnosis not present

## 2023-01-29 DIAGNOSIS — D841 Defects in the complement system: Secondary | ICD-10-CM

## 2023-01-29 DIAGNOSIS — K219 Gastro-esophageal reflux disease without esophagitis: Secondary | ICD-10-CM

## 2023-01-29 DIAGNOSIS — J3089 Other allergic rhinitis: Secondary | ICD-10-CM

## 2023-01-29 NOTE — Progress Notes (Unsigned)
[facility] - [place] [facility] [place] - [place]   Follow-up Note  Referring Provider: Hurshel Party, Herrera Primary Provider: Hurshel Party, Herrera Date of Office Visit: 01/29/2023  Subjective:   Daniel Herrera (DOB: June 05, 1940) is a 82 y.o. male who [facility] in re-evaluation of the following:  HPI: Daniel Herrera returns to this clinic in evaluation of type II hereditary angioedema treated with Daniel Herrera, allergic rhinitis, LPR, sicca syndrome, interstitial lung disease.  I last saw him in this clinic 17 July 2022.  He has not had any swelling episodes while remaining on Takhzyro every 2 weeks.  He believes that his nose is doing very well while using a nasal steroid.  He believes that his reflux is under good control while using a proton pump inhibitor.  He continues to follow-up with pulmonary regarding his interstitial lung disease and he follows up with cardiology regarding his hypertension.  He has received the flu vaccine  Allergies as of 01/29/2023       Reactions   Lisinopril Anaphylaxis, Swelling, Other (See Comments)   Face, lips, and throat swell   Amlodipine Other (See Comments)   Heart pounding, head splitting, ankles broke out    Oxycodone Other (See Comments)   Hallucination   Tape Other (See Comments)   PLASTIC TAPE TEARS OFF THE SKIN- Please use an alternative!!   Benadryl [diphenhydramine Hcl] Swelling, Other (See Comments)   Facial swelling from high dose (tolerates Advil PM as needed)   Darvocet [propoxyphene N-acetaminophen] Other (See Comments)   Hallucinations   Darvon [propoxyphene Hcl] Other (See Comments)   Hallucination   Tylenol [acetaminophen] Swelling, Other (See Comments)   Foot swelling        Medication List    amLODipine 2.5 MG tablet Commonly known as: NORVASC Take 2.5 mg by mouth daily.   aspirin EC 81 MG tablet Take 1 tablet (81 mg total) by mouth daily.   Azelastine HCl 137  MCG/SPRAY Soln Place 1 spray into both nostrils 2 (two) times daily.   betamethasone valerate ointment 0.1 % Commonly known as: VALISONE Apply 1 application topically 2 (two) times daily as needed for rash.   dextromethorphan-guaiFENesin 30-600 MG 12hr tablet Commonly known as: MUCINEX DM Take 1 tablet by mouth in the morning and at bedtime.   EPINEPHrine 0.3 mg/0.3 mL Soaj injection Commonly known as: EPI-PEN INJECT AS DIRECTED FOR LIFE-THREATHENING ALLERGIC REACTION   fluoruracil 0.5 % cream Commonly known as: CARAC Apply to skin cancer for 1-7 days during 'flare up' as directed.   fluticasone 50 MCG/ACT nasal spray Commonly known as: FLONASE Place 1 spray into both nostrils 2 (two) times daily.   icatibant Acetate 30 MG/3ML injection Commonly known as: FIRAZYR Inject 3 mLs (30 mg total) into the skin as needed.   levothyroxine 100 MCG tablet Commonly known as: SYNTHROID Take 100 mcg by mouth daily before breakfast.   Livalo 4 MG Tabs Generic drug: Pitavastatin Calcium Take 4 mg by mouth every evening.   losartan 25 MG tablet Commonly known as: COZAAR Take 1 tablet (25 mg total) by mouth daily.   metoprolol succinate 25 MG 24 hr tablet Commonly known as: TOPROL-XL Take 1 tablet (25 mg total) by mouth 2 (two) times daily.   nabumetone 750 MG tablet Commonly known as: RELAFEN Take 750 mg by mouth 2 (two) times daily.   nitroGLYCERIN 0.4 MG SL tablet Commonly known as: NITROSTAT Place 0.4 mg under  the tongue every 5 (five) minutes as needed for chest pain.   OVER THE COUNTER MEDICATION Take 1 capsule by mouth in the morning and at bedtime. Health Plus/Super Colon Cleanse   pantoprazole 40 MG tablet Commonly known as: PROTONIX Take 40 mg by mouth 2 (two) times daily.   PROBIOTIC DAILY PO Take 1 capsule by mouth daily.   Takhzyro 300 MG/2ML Sosy Generic drug: Lanadelumab-flyo INJECT 300MG  SUBCUTANEOUSLY  EVERY 2 WEEKS   temazepam 15 MG capsule Commonly  known as: RESTORIL Take 15 mg by mouth at bedtime.   Vitamin B-12 5000 MCG Tbdp Take 5,000 mcg by mouth daily.    Past Medical History:  Diagnosis Date   Abnormal EKG 02/21/2015   Acute lower GI bleeding 05/28/2019   Anginal pain (HCC) 2016   Angio-edema    Anxiety    Arthritis    CAD S/P percutaneous coronary angioplasty    NSTEMI treated with LAD PCI with DES x 03 Feb 2015 Myoview low risk Nov 2017   Cataract    Cerebellar stroke (HCC) 06/14/2021   CHF (congestive heart failure) (HCC) 2020   after surgery only   Chronic diarrhea of unknown origin    CKD (chronic kidney disease) stage 3, GFR 30-59 ml/min 12/21/2017   Colitis 05/23/2015   Coronary artery disease 05/28/2019   Diverticular hemorrhage    Diverticulosis    Dyslipidemia, goal LDL below 70 02/21/2015   hyperlipidemia    Dysrhythmia    A-fib   Enterotoxigenic Escherichia coli infection 05/24/2015   Essential hypertension 02/21/2015   Geographic tongue    GERD (gastroesophageal reflux disease)    GERD without esophagitis 06/15/2021   GI bleed 12/21/2017   Glossitis, benign migratory 12/04/2018   Gout    Hematochezia 12/21/2017   Admitted Sept 2019 with lower GI bleeding felt to be secondary to hemorrhoids   Hereditary angioedema type 2 (HCC) 07/28/2017   High cholesterol    History of kidney stones    History of non-ST elevation myocardial infarction (NSTEMI) 02/22/2015   Nov 2016   History of prolonged Q-T interval on ECG    History of prostate cancer    Hoarseness 12/04/2018   Hx of Clostridium difficile infection    Hx of umbilical hernia repair    Hypertension    Hypothyroidism    Hypothyroidism, unspecified 06/15/2021   ILD (interstitial lung disease) (HCC) 12/17/2013   ?related to chicken farm exposure or aspiration pneumonitis from GERD Previously attributed to acute interstitial pneumonitis but based on HRCT this is likely IPF, "probable UIP"   Lower GI bleed 06/03/2019   Malignant neoplasm of  prostate (HCC) 08/07/2014   Myocardial infarction (HCC) 2016   NSTEMI (non-ST elevated myocardial infarction) (HCC) 05/21/2018   Open metatarsal fracture 09/02/2020   PAF (paroxysmal atrial fibrillation) (HCC) 06/13/2018   Post CABG- discharged on Amiodarone, he will probably not need this long term   Palpitations 03/15/2015   palpitations    Peripheral vascular disease (HCC)    vericose veins   Personal history of digestive disease    gastric ulcer   Pre-diabetes    Pre-operative clearance 01/02/2018   Prostate cancer (HCC)    Prostate enlargement    S/P CABG x 3 05/28/2018    LIMA-LAD, RIMA-RCA, and Lt radial to OM.   Skin cancer    basal cell carcinoma   STEC (Shiga toxin-producing Escherichia coli) infection 05/24/2015   Stroke (cerebrum) (HCC) 06/15/2021   Tachycardia determined by examination of pulse  Thyroid disease    Type 2 diabetes mellitus with unspecified complications (HCC)    Type 2 diabetes mellitus without complications (HCC) 06/15/2021   Urticaria     Past Surgical History:  Procedure Laterality Date   APPENDECTOMY     BACK SURGERY     CARDIAC CATHETERIZATION N/A 02/21/2015   Procedure: Left Heart Cath;  Surgeon: Runell Gess, MD;  Location: [facility] INVASIVE CV LAB;  Service: Cardiovascular;  Laterality: N/A;   CATARACT EXTRACTION, BILATERAL     CORONARY ARTERY BYPASS GRAFT N/A 05/24/2018   Procedure: CORONARY ARTERY BYPASS GRAFTING (CABG), ON PUMP, TIMES THREE, USING BILATERAL INTERNAL MAMMARY ARTERY AND HARVESTED LEFT RADIAL ARTERY;  Surgeon: Alleen Borne, MD;  Location: [facility] OR;  Service: Open Heart Surgery;  Laterality: N/A;   CYSTOSCOPY WITH LITHOLAPAXY N/A 07/26/2020   Procedure: CYSTOSCOPY WITH LITHOLAPAXY WITH FULGERATION;  Surgeon: Heloise Purpura, MD;  Location: [facility] ORS;  Service: Urology;  Laterality: N/A;   HOLMIUM LASER APPLICATION N/A 07/26/2020   Procedure: HOLMIUM LASER APPLICATION;  Surgeon: Heloise Purpura, MD;  Location: [facility] ORS;  Service:  Urology;  Laterality: N/A;   I & D EXTREMITY Right 09/02/2020   Procedure: IRRIGATION AND DEBRIDEMENT RIGHT 2ND AND 3RD METATARSAL;  Surgeon: Toni Arthurs, MD;  Location: [facility] OR;  Service: Orthopedics;  Laterality: Right;   LEFT HEART CATH AND CORONARY ANGIOGRAPHY N/A 05/22/2018   Procedure: LEFT HEART CATH AND CORONARY ANGIOGRAPHY;  Surgeon: Corky Crafts, MD;  Location: [facility] INVASIVE CV LAB;  Service: Cardiovascular;  Laterality: N/A;   NASAL SINUS SURGERY     ORIF TOE FRACTURE Right 09/02/2020   Procedure: OPEN REDUCTION INTERNAL FIXATION (ORIF) OF RIGHT OPEN 2ND AND 3RD METATARSAL (TOE) OPEN FRACTURE;  Surgeon: Toni Arthurs, MD;  Location: [facility] OR;  Service: Orthopedics;  Laterality: Right;   PROSTATE BIOPSY     RADIAL ARTERY HARVEST Left 05/24/2018   Procedure: RADIAL ARTERY HARVEST;  Surgeon: Alleen Borne, MD;  Location: [facility] OR;  Service: Open Heart Surgery;  Laterality: Left;   right shoulder rotator cuff repair     TEE WITHOUT CARDIOVERSION N/A 05/24/2018   Procedure: TRANSESOPHAGEAL ECHOCARDIOGRAM (TEE);  Surgeon: Alleen Borne, MD;  Location: [facility] OR;  Service: Open Heart Surgery;  Laterality: N/A;    Review of systems negative except as noted in HPI / PMHx or noted below:  Review of Systems  Constitutional: Negative.   HENT: Negative.    Eyes: Negative.   Respiratory: Negative.    Cardiovascular: Negative.   Gastrointestinal: Negative.   Genitourinary: Negative.   Musculoskeletal: Negative.   Skin: Negative.   Neurological: Negative.   Endo/Heme/Allergies: Negative.   Psychiatric/Behavioral: Negative.       Objective:   Vitals:   01/29/23 1033  BP: 136/72  Pulse: 65  Resp: 16  SpO2: 98%          Physical Exam Constitutional:      Appearance: He is not diaphoretic.  HENT:     Head: Normocephalic.     Right Ear: Tympanic membrane, ear canal and external ear normal.     Left Ear: Tympanic membrane, ear canal and external ear normal.     Nose: Nose normal. No  mucosal edema or rhinorrhea.     Mouth/Throat:     Pharynx: Uvula midline. No oropharyngeal exudate.  Eyes:     Conjunctiva/sclera: Conjunctivae normal.  Neck:     Thyroid: No thyromegaly.     Trachea: Trachea normal. No tracheal tenderness or tracheal  deviation.  Cardiovascular:     Rate and Rhythm: Normal rate and regular rhythm.     Heart sounds: Normal heart sounds, S1 normal and S2 normal. No murmur heard. Pulmonary:     Effort: No respiratory distress.     Breath sounds: Normal breath sounds. No stridor. No wheezing or rales.  Lymphadenopathy:     Head:     Right side of head: No tonsillar adenopathy.     Left side of head: No tonsillar adenopathy.     Cervical: No cervical adenopathy.  Skin:    Findings: No erythema or rash.     Nails: There is no clubbing.  Neurological:     Mental Status: He is alert.     Diagnostics: none  Assessment and Plan:   1. Hereditary angioedema type 2 (HCC)   2. Perennial allergic rhinitis   3. LPRD (laryngopharyngeal reflux disease)     1.  Continue Takhzryo every 14 days.   2.  Continue to Treat and prevent inflammation:    A.  Flonase - 1-2 sprays each nostril once a day  3.  Continue to Treat and prevent reflux:   A.  pantoprazole 40 mg twice a day  4.  If needed:   A.  Nasal saline spray  B.  OTC Mucinex DM - 2 tablets twice a day  C.  Firazyr injection for swelling reaction  D.  Systane eye drops  5. Return to clinic in 6 months or earlier if problem  Imari is doing very well with his hereditary angioedema and allergic rhinitis and LPR on his current plan and he will remain on the agents noted above and we will see him back in this clinic in 6 months or earlier if there is a problem.   Laurette Schimke, MD Allergy / Immunology [facility]

## 2023-01-29 NOTE — Patient Instructions (Signed)
  1.  Continue Takhzryo every 14 days.   2.  Continue to Treat and prevent inflammation:    A.  Flonase - 1-2 sprays each nostril once a day  3.  Continue to Treat and prevent reflux:   A.  pantoprazole 40 mg twice a day  4.  If needed:   A.  Nasal saline spray  B.  OTC Mucinex DM - 2 tablets twice a day  C.  Firazyr injection for swelling reaction  D.  Systane eye drops  5. Return to clinic in 6 months or earlier if problem

## 2023-01-30 ENCOUNTER — Encounter: Payer: Self-pay | Admitting: Allergy and Immunology

## 2023-02-08 ENCOUNTER — Telehealth (HOSPITAL_BASED_OUTPATIENT_CLINIC_OR_DEPARTMENT_OTHER): Payer: Self-pay | Admitting: Pulmonary Disease

## 2023-02-08 NOTE — Telephone Encounter (Signed)
CT results faxed by [facility] have been received for patient. Placed in [place] box

## 2023-02-14 NOTE — Telephone Encounter (Signed)
HRCT @ [facility] shows slight worsening of fibrosis Please schedule pFTs prior to next appt in Jan & we can discuss options on OV

## 2023-02-19 ENCOUNTER — Other Ambulatory Visit (HOSPITAL_BASED_OUTPATIENT_CLINIC_OR_DEPARTMENT_OTHER): Payer: Self-pay

## 2023-02-19 DIAGNOSIS — J849 Interstitial pulmonary disease, unspecified: Secondary | ICD-10-CM

## 2023-02-19 NOTE — Telephone Encounter (Signed)
Called and spoke with patient informed patient of his results of  CT scan pt , stated that he is agreeable to having pft  done. Tried to schedule , wasn't able schedule to due to  schedule not being open .advised pt that some one will call him back to schedule this appt. Order was placed.

## 2023-03-05 DIAGNOSIS — M545 Low back pain, unspecified: Secondary | ICD-10-CM | POA: Diagnosis not present

## 2023-03-05 DIAGNOSIS — M25552 Pain in left hip: Secondary | ICD-10-CM | POA: Diagnosis not present

## 2023-03-07 ENCOUNTER — Other Ambulatory Visit: Payer: Self-pay | Admitting: Cardiology

## 2023-03-13 ENCOUNTER — Encounter: Payer: Self-pay | Admitting: Pulmonary Disease

## 2023-03-20 DIAGNOSIS — R262 Difficulty in walking, not elsewhere classified: Secondary | ICD-10-CM | POA: Diagnosis not present

## 2023-03-20 DIAGNOSIS — M6281 Muscle weakness (generalized): Secondary | ICD-10-CM | POA: Diagnosis not present

## 2023-03-20 DIAGNOSIS — M5416 Radiculopathy, lumbar region: Secondary | ICD-10-CM | POA: Diagnosis not present

## 2023-03-23 DIAGNOSIS — Z9181 History of falling: Secondary | ICD-10-CM | POA: Diagnosis not present

## 2023-03-23 DIAGNOSIS — N1831 Chronic kidney disease, stage 3a: Secondary | ICD-10-CM | POA: Diagnosis not present

## 2023-03-23 DIAGNOSIS — J849 Interstitial pulmonary disease, unspecified: Secondary | ICD-10-CM | POA: Diagnosis not present

## 2023-03-23 DIAGNOSIS — D841 Defects in the complement system: Secondary | ICD-10-CM | POA: Diagnosis not present

## 2023-03-23 DIAGNOSIS — I10 Essential (primary) hypertension: Secondary | ICD-10-CM | POA: Diagnosis not present

## 2023-03-23 DIAGNOSIS — E785 Hyperlipidemia, unspecified: Secondary | ICD-10-CM | POA: Diagnosis not present

## 2023-03-23 DIAGNOSIS — E1169 Type 2 diabetes mellitus with other specified complication: Secondary | ICD-10-CM | POA: Diagnosis not present

## 2023-03-23 DIAGNOSIS — E538 Deficiency of other specified B group vitamins: Secondary | ICD-10-CM | POA: Diagnosis not present

## 2023-03-23 DIAGNOSIS — D649 Anemia, unspecified: Secondary | ICD-10-CM | POA: Diagnosis not present

## 2023-03-23 DIAGNOSIS — I700 Atherosclerosis of aorta: Secondary | ICD-10-CM | POA: Diagnosis not present

## 2023-03-23 DIAGNOSIS — E559 Vitamin D deficiency, unspecified: Secondary | ICD-10-CM | POA: Diagnosis not present

## 2023-03-23 DIAGNOSIS — E039 Hypothyroidism, unspecified: Secondary | ICD-10-CM | POA: Diagnosis not present

## 2023-04-05 ENCOUNTER — Other Ambulatory Visit: Payer: Self-pay | Admitting: Cardiology

## 2023-04-05 NOTE — Telephone Encounter (Signed)
 Rx refill sent to pharmacy.

## 2023-04-09 ENCOUNTER — Ambulatory Visit (HOSPITAL_BASED_OUTPATIENT_CLINIC_OR_DEPARTMENT_OTHER): Payer: Medicare Other | Admitting: Pulmonary Disease

## 2023-04-17 DIAGNOSIS — B370 Candidal stomatitis: Secondary | ICD-10-CM | POA: Diagnosis not present

## 2023-04-17 DIAGNOSIS — J849 Interstitial pulmonary disease, unspecified: Secondary | ICD-10-CM | POA: Diagnosis not present

## 2023-04-17 DIAGNOSIS — J180 Bronchopneumonia, unspecified organism: Secondary | ICD-10-CM | POA: Diagnosis not present

## 2023-05-07 ENCOUNTER — Encounter (HOSPITAL_BASED_OUTPATIENT_CLINIC_OR_DEPARTMENT_OTHER): Payer: Self-pay | Admitting: Pulmonary Disease

## 2023-05-07 ENCOUNTER — Ambulatory Visit (HOSPITAL_BASED_OUTPATIENT_CLINIC_OR_DEPARTMENT_OTHER): Payer: Medicare Other | Admitting: Pulmonary Disease

## 2023-05-07 VITALS — BP 140/76 | HR 80 | Ht 73.0 in | Wt 190.0 lb

## 2023-05-07 DIAGNOSIS — J849 Interstitial pulmonary disease, unspecified: Secondary | ICD-10-CM

## 2023-05-07 NOTE — Patient Instructions (Addendum)
X AMb sat  I am worried if your lung fibrosis was getting worse.   Schedule breathing test we will  start antifibrotic medication called pirfenidone. This med does not cause diarrhea, we discussed sunscreen and checking liver test every month for the first 3 months and then every 3 months

## 2023-05-07 NOTE — Progress Notes (Signed)
Subjective:    Patient ID: Daniel Herrera, male    DOB: 06-22-1940, 83 y.o.   MRN: [id number]  HPI  83 yo never smoker for FU of ILD/probable UIP   PMH- prostate CA, C diff colitis  SVTs that stop with vagal maneuvers,   Recurrent angioedema - on lanadelumab which is a monoclonal antibody to plasma kallilrein/bradykinin CABG 06/2018 ,MI 06/2021 and cryptogenic stroke in the cerebellum  chronic hoarseness for many years, ENT evaluation  negative   He presented in 2015 with dyspnea for a few months, PFTs were normal except for mildly decreased DLCO .  CT chest at [facility] showed mild interstitial prominence. This was attributed to chicken farm exposure or GERD related pneumonitis but has shown gradual progression to "probable UIP" pattern.  We have discussed antifibrotic's on his past visits he has decided against starting.   82-month follow-up visit He underwent CT imaging in [place] which we reviewed, showed probable UIP with minimal worsening compared to 2023 definite worsening compared to 2015. He had a viral illness with cough in November was initially treated with amoxicillin and prednisone and then he was hospitalized and was told that he had a "touch of pneumonia", also diagnosed with COVID infection.  He improved, now only has a residual cough, oxygen saturation has improved to 94 to 96%, better than his wife.  He takes promethazine cough syrup and to avoid antihistaminic he takes Tussin MD.  Overall he feels back to his baseline. 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  He states that it is at condition and there and the air feels good   Significant tests/ events reviewed  HRCT chest 01/2023 probable UIP with definite worsening since 2015 HRCT chest 12/2021 > unchanged ILD, probable UIP HRCT 09/2020 >> 'probable UIP' worse compared to 2015   HRCT chest 05/2019 -probable UIP, no definite honeycombing   CT chest without contrast 11/2013  showed  interstitial prominence particularly apical with mild bronchiectasis, small patchy infiltrate was noted in the right lower lobe with a tiny effusion. ESR ,CCP neg   PFTs 09/2019 FVC stable at 94%, DLCO stable at 16.6/62% compared to 15.6 and 58% in 2020   PFTs 02/2014 - nml FEV1/ FVC & ratio, TLC 81%, DLCO 65%    Review of Systems neg for any significant sore throat, dysphagia, itching, sneezing, nasal congestion or excess/ purulent secretions, fever, chills, sweats, unintended wt loss, pleuritic or exertional cp, hempoptysis, orthopnea pnd or change in chronic leg swelling. Also denies presyncope, palpitations, heartburn, abdominal pain, nausea, vomiting, diarrhea or change in bowel or urinary habits, dysuria,hematuria, rash, arthralgias, visual complaints, headache, numbness weakness or ataxia.     Objective:   Physical Exam  Gen. Pleasant, well-nourished, in no distress ENT - no thrush, no pallor/icterus,no post nasal drip Neck: No JVD, no thyromegaly, no carotid bruits Lungs: no use of accessory muscles, no dullness to percussion, bibasal rales no rhonchi  Cardiovascular: Rhythm regular, heart sounds  normal, no murmurs or gallops, no peripheral edema Musculoskeletal: No deformities, no cyanosis or clubbing        Assessment & Plan:    Chronic hypoxic respiratory failure ILD -probable UIP pattern and this is likely IPF although no honeycombing.  He has definite progression compared to 2015 but only mild progression compared to 2023.  His recent illness seems to have been viral possibly COVID and he has recovered and is back to his baseline. He did desaturate to 84% on  ambulation although recovers with resting to 95%.  I am not sure if this is related to his viral illness or worsening IPF.  We will schedule PFTs and if that shows worsening then we will proceed with starting him on Esbriet -he is concerned about side effects and does not want Ofev due to side effect of diarrhea. We  discussed photosensitivity and liver function testing We will reassess him in 2 to 3 months  He does not want to start oxygen today and would like to hold off

## 2023-05-08 ENCOUNTER — Telehealth: Payer: Self-pay | Admitting: Pharmacist

## 2023-05-08 DIAGNOSIS — J849 Interstitial pulmonary disease, unspecified: Secondary | ICD-10-CM

## 2023-05-08 NOTE — Telephone Encounter (Signed)
 Received new start paperwork for pirfenidone . Placing in PAP pending info folder in pharmacy office  Submitted a Prior Authorization request to [employer] for PIRFENIDONE  via CoverMyMeds. Will update once we receive a response.  Key: HARRIE Sherry Pennant, PharmD, MPH, BCPS, CPP Clinical Pharmacist (Rheumatology and Pulmonology)

## 2023-05-16 DIAGNOSIS — L578 Other skin changes due to chronic exposure to nonionizing radiation: Secondary | ICD-10-CM | POA: Diagnosis not present

## 2023-05-16 DIAGNOSIS — C44319 Basal cell carcinoma of skin of other parts of face: Secondary | ICD-10-CM | POA: Diagnosis not present

## 2023-05-16 DIAGNOSIS — L814 Other melanin hyperpigmentation: Secondary | ICD-10-CM | POA: Diagnosis not present

## 2023-05-16 DIAGNOSIS — L570 Actinic keratosis: Secondary | ICD-10-CM | POA: Diagnosis not present

## 2023-05-23 ENCOUNTER — Other Ambulatory Visit (HOSPITAL_COMMUNITY): Payer: Self-pay

## 2023-05-23 MED ORDER — PIRFENIDONE 267 MG PO TABS
ORAL_TABLET | ORAL | 0 refills | Status: DC
Start: 2023-05-23 — End: 2023-05-24

## 2023-05-23 NOTE — Telephone Encounter (Signed)
Received notification from [facility] regarding a prior authorization for PIRFENIDONE. Authorization has been APPROVED from 05/08/2023 to 04/02/2024. Approval letter sent to scan center.  Per test claim, copay for 30 days supply is $0  Patient can fill through [facility]: [phone] . Looks like patient is filling another medication through [employer].  Authorization # [id number] Phone # [phone]

## 2023-05-24 ENCOUNTER — Encounter (HOSPITAL_BASED_OUTPATIENT_CLINIC_OR_DEPARTMENT_OTHER): Payer: Medicare Other

## 2023-05-24 ENCOUNTER — Other Ambulatory Visit: Payer: Self-pay | Admitting: Pharmacist

## 2023-05-24 ENCOUNTER — Telehealth: Payer: Self-pay | Admitting: Pulmonary Disease

## 2023-05-24 MED ORDER — PIRFENIDONE 801 MG PO TABS
801.0000 mg | ORAL_TABLET | Freq: Three times a day (TID) | ORAL | 0 refills | Status: DC
Start: 2023-05-24 — End: 2023-10-18

## 2023-05-24 MED ORDER — PIRFENIDONE 267 MG PO TABS
ORAL_TABLET | ORAL | 0 refills | Status: DC
Start: 2023-05-24 — End: 2023-10-18

## 2023-05-24 NOTE — Telephone Encounter (Signed)
Is this correct dosing ?

## 2023-05-24 NOTE — Telephone Encounter (Signed)
[facility] needs clarification on prescription medication pirfenidone . It needs to be written as 9 tablets per day. Please call with an update.[phone]

## 2023-05-24 NOTE — Telephone Encounter (Signed)
ATC apteitn but phone went to VM. Rx for pirfenidone sent to [facility] per his request.  Left VM with pharmacy phone number for him to follow-up and schedule shipment  Chesley Mires, PharmD, MPH, BCPS, CPP Clinical Pharmacist (Rheumatology and Pulmonology)

## 2023-05-28 NOTE — Telephone Encounter (Signed)
 Spoke [employer] will only cover 6 tabs daily so will need PA for Quantity

## 2023-05-30 NOTE — Telephone Encounter (Signed)
 Insurance does not cover tabs daily of 267mg  tabs. Patients must switch to 801mg  tablets after first month once on maintenance dose of 801mg  three times daily  They will run for 126 tab for 21 days which processed successfully.   Chesley Mires, PharmD, MPH, BCPS, CPP Clinical Pharmacist (Rheumatology and Pulmonology)

## 2023-06-23 ENCOUNTER — Other Ambulatory Visit: Payer: Self-pay | Admitting: Cardiology

## 2023-06-29 DIAGNOSIS — E1169 Type 2 diabetes mellitus with other specified complication: Secondary | ICD-10-CM | POA: Diagnosis not present

## 2023-06-29 DIAGNOSIS — J849 Interstitial pulmonary disease, unspecified: Secondary | ICD-10-CM | POA: Diagnosis not present

## 2023-06-29 DIAGNOSIS — D649 Anemia, unspecified: Secondary | ICD-10-CM | POA: Diagnosis not present

## 2023-06-29 DIAGNOSIS — I10 Essential (primary) hypertension: Secondary | ICD-10-CM | POA: Diagnosis not present

## 2023-06-29 DIAGNOSIS — E039 Hypothyroidism, unspecified: Secondary | ICD-10-CM | POA: Diagnosis not present

## 2023-06-29 DIAGNOSIS — E785 Hyperlipidemia, unspecified: Secondary | ICD-10-CM | POA: Diagnosis not present

## 2023-06-29 DIAGNOSIS — E538 Deficiency of other specified B group vitamins: Secondary | ICD-10-CM | POA: Diagnosis not present

## 2023-06-29 DIAGNOSIS — C61 Malignant neoplasm of prostate: Secondary | ICD-10-CM | POA: Diagnosis not present

## 2023-06-29 DIAGNOSIS — I700 Atherosclerosis of aorta: Secondary | ICD-10-CM | POA: Diagnosis not present

## 2023-06-29 DIAGNOSIS — N1831 Chronic kidney disease, stage 3a: Secondary | ICD-10-CM | POA: Diagnosis not present

## 2023-06-29 DIAGNOSIS — E559 Vitamin D deficiency, unspecified: Secondary | ICD-10-CM | POA: Diagnosis not present

## 2023-06-29 DIAGNOSIS — D841 Defects in the complement system: Secondary | ICD-10-CM | POA: Diagnosis not present

## 2023-06-30 LAB — HEMOGLOBIN A1C: A1c: 5.7

## 2023-06-30 LAB — COMPREHENSIVE METABOLIC PANEL WITH GFR: EGFR: 42

## 2023-07-05 ENCOUNTER — Encounter (HOSPITAL_BASED_OUTPATIENT_CLINIC_OR_DEPARTMENT_OTHER): Payer: Self-pay | Admitting: Pulmonary Disease

## 2023-07-05 ENCOUNTER — Ambulatory Visit (HOSPITAL_BASED_OUTPATIENT_CLINIC_OR_DEPARTMENT_OTHER): Payer: Medicare Other | Admitting: Pulmonary Disease

## 2023-07-05 VITALS — BP 122/80 | HR 76 | Ht 71.0 in | Wt 192.0 lb

## 2023-07-05 DIAGNOSIS — J849 Interstitial pulmonary disease, unspecified: Secondary | ICD-10-CM

## 2023-07-05 LAB — PULMONARY FUNCTION TEST
DL/VA % pred: 76 %
DL/VA: 2.91 ml/min/mmHg/L
DLCO cor % pred: 49 %
DLCO cor: 12.1 ml/min/mmHg
DLCO unc % pred: 49 %
DLCO unc: 12.1 ml/min/mmHg
FEF 25-75 Post: 3.66 L/s
FEF 25-75 Pre: 3.77 L/s
FEF2575-%Change-Post: -2 %
FEF2575-%Pred-Post: 195 %
FEF2575-%Pred-Pre: 201 %
FEV1-%Change-Post: -1 %
FEV1-%Pred-Post: 96 %
FEV1-%Pred-Pre: 98 %
FEV1-Post: 2.72 L
FEV1-Pre: 2.77 L
FEV1FVC-%Change-Post: -2 %
FEV1FVC-%Pred-Pre: 126 %
FEV6-%Change-Post: 0 %
FEV6-%Pred-Post: 83 %
FEV6-%Pred-Pre: 83 %
FEV6-Post: 3.14 L
FEV6-Pre: 3.11 L
FEV6FVC-%Pred-Post: 107 %
FEV6FVC-%Pred-Pre: 107 %
FVC-%Change-Post: 0 %
FVC-%Pred-Post: 78 %
FVC-%Pred-Pre: 77 %
FVC-Post: 3.14 L
FVC-Pre: 3.11 L
Post FEV1/FVC ratio: 87 %
Post FEV6/FVC ratio: 100 %
Pre FEV1/FVC ratio: 89 %
Pre FEV6/FVC Ratio: 100 %
RV % pred: 76 %
RV: 2.13 L
TLC % pred: 71 %
TLC: 5.2 L

## 2023-07-05 NOTE — Patient Instructions (Signed)
 Full PFT Performed Today

## 2023-07-05 NOTE — Progress Notes (Deleted)
 Full pft performed today.

## 2023-07-05 NOTE — Progress Notes (Signed)
 Full PFT Performed Today

## 2023-07-05 NOTE — Progress Notes (Signed)
 Subjective:    Patient ID: Daniel Herrera, male    DOB: 06-18-1940, 83 y.o.   MRN: [id number]  HPI  83 yo never smoker for FU of ILD/probable UIP   PMH- prostate CA, C diff colitis  SVTs that stop with vagal maneuvers,   Recurrent angioedema - on lanadelumab which is a monoclonal antibody to plasma kallilrein/bradykinin CABG 06/2018 ,MI 06/2021 and cryptogenic stroke in the cerebellum  chronic hoarseness for many years, ENT evaluation  negative   He presented in 2015 with dyspnea for a few months, PFTs were normal except for mildly decreased DLCO .  CT chest at [facility] showed mild interstitial prominence. This was attributed to chicken farm exposure or GERD related pneumonitis but has shown gradual progression to "probable UIP" pattern.  We discussed antifibrotic's on his past visits he  decided against starting. OV 05/2023 He desaturate to 84% on ambulation although recovers with resting to 95%.  2 month FU    Daniel Herrera, a patient with a history of fibrosis, presents with concerns about side effects from his current medication regimen. He reports experiencing shaking after taking albuterol, a medication he was prescribed for testing. He also discusses his recent experience with COVID-19, which significantly weakened him and led to pneumonia. He reports that he has since recovered from the illness and feels much improved.  The patient has recently started a new medication, which he has been taking for approximately two weeks. He reports experiencing side effects including a "silly headache," blurred vision, and constipation. He has been taking the medication three times a day, but is unsure if he will be able to increase the dosage to the recommended six times a day due to the side effects. He also reports a change in the nature of his mucus, which has become foamy and less thick. He continues to take Mucinex twice a day to manage his phlegm production.  The patient also mentions a concern  about a drop in his lung function, which he attributes to his recent bout of COVID-19. He expresses a desire to maintain his quality of life and is willing to adjust his medication regimen to achieve this. He also expresses a fear of needing to go on oxygen in the future.   Significant tests/ events reviewed   HRCT chest 01/2023 probable UIP with definite worsening since 2015 HRCT chest 12/2021 > unchanged ILD, probable UIP HRCT 09/2020 >> 'probable UIP' worse compared to 2015   HRCT chest 05/2019 -probable UIP, no definite honeycombing   CT chest without contrast 11/2013  showed interstitial prominence particularly apical with mild bronchiectasis, small patchy infiltrate was noted in the right lower lobe with a tiny effusion. ESR ,CCP neg   PFTs 07/2023 >> drop in FVC to 77% , drop in DLCO 12.10/49% PFTs 09/2019 FVC stable at 94%, DLCO stable at 16.6/62% compared to 15.6 and 58% in 2020   PFTs 02/2014 - nml FEV1/ FVC & ratio, TLC 81%, DLCO 65%  Review of Systems neg for any significant sore throat, dysphagia, itching, sneezing, nasal congestion or excess/ purulent secretions, fever, chills, sweats, unintended wt loss, pleuritic or exertional cp, hempoptysis, orthopnea pnd or change in chronic leg swelling. Also denies presyncope, palpitations, heartburn, abdominal pain, nausea, vomiting, diarrhea or change in bowel or urinary habits, dysuria,hematuria, rash, arthralgias, visual complaints, headache, numbness weakness or ataxia.     Objective:   Physical Exam   Gen. Pleasant, well-nourished, in no distress ENT - no thrush, no pallor/icterus,no post nasal  drip Neck: No JVD, no thyromegaly, no carotid bruits Lungs: no use of accessory muscles, no dullness to percussion, clear without rales or rhonchi  Cardiovascular: Rhythm regular, heart sounds  normal, no murmurs or gallops, no peripheral edema Musculoskeletal: No deformities, no cyanosis or clubbing       Assessment & Plan:    Idiopathic Pulmonary Fibrosis (IPF) He has idiopathic pulmonary fibrosis with a decline in lung function, indicating possible disease progression. The prescribed medication aims to slow fibrosis progression and prevent oxygen dependency. He reports side effects including headaches, blurred vision, and constipation. The medication should not affect phlegm production but is intended to prevent further fibrosis. He is currently on one tablet three times a day and is advised to increase to three tablets three times a day if tolerated. He prioritizes quality of life over medication side effects. - Increase medication to two tablets three times a day if tolerated - Monitor for side effects and adjust medication as needed - Check liver function at the end of the month and then monthly for the first three months - Continue Mucinex as needed for phlegm management  Post-COVID-19 Pneumonia He had COVID-19 leading to pneumonia and significant weakness, but reports improvement. He is concerned about potential oxygen dependency, but current oxygen levels remain stable, even with activity. - Monitor oxygen levels, especially during physical activity   Goals of Care He values quality of life over aggressive treatment and has made end-of-life arrangements. He desires independence and the ability to perform daily activities. He is open to adjusting treatment if side effects become intolerable, emphasizing quality over quantity of life. - Discuss and respect his preferences for quality of life in treatment planning

## 2023-07-05 NOTE — Patient Instructions (Addendum)
 Try to increase to 2 tabs thrice daily x 2 weeks , then 3 tabs thrice daily  Check LFTs end April & end May

## 2023-07-10 ENCOUNTER — Telehealth: Payer: Self-pay

## 2023-07-10 NOTE — Telephone Encounter (Signed)
 Copied from CRM [id number]. Topic: Clinical - Request for Lab/Test Order >> Jul 10, 2023 11:25 AM Shelbie Proctor wrote: Reason for CRM: Patient [phone] states will see Dr. Dr. Gaye Alken 10/16/23 and have labs done with her, patient is asking for Dr. Vassie Loll to send lab orders to Dr. Laban Emperor office phone number [phone]. Please advise and call back.

## 2023-07-11 ENCOUNTER — Other Ambulatory Visit (HOSPITAL_BASED_OUTPATIENT_CLINIC_OR_DEPARTMENT_OTHER): Payer: Self-pay

## 2023-07-11 DIAGNOSIS — J849 Interstitial pulmonary disease, unspecified: Secondary | ICD-10-CM

## 2023-07-11 NOTE — Telephone Encounter (Signed)
 Liver Function ordered will print and fax once signed

## 2023-07-19 ENCOUNTER — Other Ambulatory Visit: Payer: Self-pay | Admitting: Allergy and Immunology

## 2023-07-26 ENCOUNTER — Ambulatory Visit: Admitting: Allergy and Immunology

## 2023-08-09 ENCOUNTER — Encounter: Payer: Self-pay | Admitting: Allergy and Immunology

## 2023-08-09 ENCOUNTER — Ambulatory Visit (INDEPENDENT_AMBULATORY_CARE_PROVIDER_SITE_OTHER): Admitting: Allergy and Immunology

## 2023-08-09 VITALS — BP 136/74 | HR 64 | Resp 14 | Ht 70.4 in | Wt 188.2 lb

## 2023-08-09 DIAGNOSIS — D841 Defects in the complement system: Secondary | ICD-10-CM | POA: Diagnosis not present

## 2023-08-09 DIAGNOSIS — J3089 Other allergic rhinitis: Secondary | ICD-10-CM | POA: Diagnosis not present

## 2023-08-09 DIAGNOSIS — M3500 Sicca syndrome, unspecified: Secondary | ICD-10-CM | POA: Diagnosis not present

## 2023-08-09 DIAGNOSIS — K219 Gastro-esophageal reflux disease without esophagitis: Secondary | ICD-10-CM | POA: Diagnosis not present

## 2023-08-09 NOTE — Patient Instructions (Signed)
  1.  Continue Takhzryo every 14 days.   2.  Continue to Treat and prevent inflammation:    A.  Flonase  - 1-2 sprays each nostril once a day  3.  Continue to Treat and prevent reflux:   A.  pantoprazole  40 mg twice a day  4.  If needed:   A.  Nasal saline spray  B.  Azelastine  nose spray 1-2 times per day  C.  OTC Mucinex  DM - 2 tablets twice a day  D.  Firazyr  injection for swelling reaction  E.  Systane eye drops  5. Return to clinic in 6 months or earlier if problem  6. Influenza = Tamiflu. Covid = Paxlovid

## 2023-08-09 NOTE — Progress Notes (Signed)
 [place] [facility] [place] - [place] - [place] [facility] [place]   Follow-up Note  Referring Provider: Olga Berthold, NP Primary Provider: Olga Berthold, NP Date of Office Visit: 08/09/2023  Subjective:   Daniel Herrera (DOB: 04/25/1940) is a 83 y.o. male who [facility] in re-evaluation of the following:  HPI: Daniel Herrera returns to this clinic in evaluation of type II hereditary angioedema treated with Takhzyro , allergic rhinitis, LPR, and a history of sicca syndrome and interstitial lung disease.  I last saw him in this clinic 29 January 2023.  He has not had any swelling episodes while using his Takhzyro  every 2 weeks.  His nose is doing well using a nasal steroid.  His reflux is doing well using a proton pump inhibitor.  Dr. Villa Herrera, pulmonary, started Daniel Herrera on medication for his pulmonary fibrosis but he was intolerant of using this agent.  In review, he has had over 2 decades of interstitial fibrosis and he is not any worse now than he was at the beginning of this disease presentation and he is not really sure that he needs any treatment.  Allergies as of 08/09/2023       Reactions   Lisinopril  Anaphylaxis, Swelling, Other (See Comments)   Face, lips, and throat swell   Amlodipine  Other (See Comments)   Heart pounding, head splitting, ankles broke out    Oxycodone  Other (See Comments)   Hallucination   Tape Other (See Comments)   PLASTIC TAPE TEARS OFF THE SKIN- Please use an alternative!!   Benadryl [diphenhydramine Hcl] Swelling, Other (See Comments)   Facial swelling from high dose (tolerates Advil  PM as needed)   Darvocet [propoxyphene N-acetaminophen ] Other (See Comments)   Hallucinations   Darvon [propoxyphene Hcl] Other (See Comments)   Hallucination   Tylenol  [acetaminophen ] Swelling, Other (See Comments)   Foot swelling        Medication List    amLODipine  2.5 MG tablet Commonly known as: NORVASC  TAKE 1 TABLET BY MOUTH  EVERY DAY   aspirin  EC 81 MG tablet Take 1 tablet (81 mg total) by mouth daily.   Azelastine  HCl 137 MCG/SPRAY Soln Place 1 spray into both nostrils 2 (two) times daily.   betamethasone  valerate ointment 0.1 % Commonly known as: VALISONE  Apply 1 application topically 2 (two) times daily as needed for rash.   busPIRone 5 MG tablet Commonly known as: BUSPAR Take 5 mg by mouth 3 (three) times daily as needed.   CENTRUM PO Take by mouth.   dextromethorphan -guaiFENesin  30-600 MG 12hr tablet Commonly known as: MUCINEX  DM Take 1 tablet by mouth in the morning and at bedtime.   fluoruracil 0.5 % cream Commonly known as: CARAC  Apply to skin cancer for 1-7 days during 'flare up' as directed.   fluticasone  50 MCG/ACT nasal spray Commonly known as: FLONASE  Place 1 spray into both nostrils 2 (two) times daily.   levothyroxine  100 MCG tablet Commonly known as: SYNTHROID  Take 100 mcg by mouth daily before breakfast.   Livalo  4 MG Tabs Generic drug: Pitavastatin  Calcium  Take 4 mg by mouth every evening.   losartan  25 MG tablet Commonly known as: COZAAR  Take 1 tablet (25 mg total) by mouth daily.   metoprolol  succinate 25 MG 24 hr tablet Commonly known as: TOPROL -XL Take 1 tablet (25 mg total) by mouth 2 (two) times daily.   nabumetone  750 MG tablet Commonly known as: RELAFEN  Take 750 mg by mouth 2 (two) times daily.  nitroGLYCERIN  0.4 MG SL tablet Commonly known as: NITROSTAT  Place 0.4 mg under the tongue every 5 (five) minutes as needed for chest pain.   OVER THE COUNTER MEDICATION Take 1 capsule by mouth in the morning and at bedtime. Health Plus/Super Colon Cleanse   pantoprazole  40 MG tablet Commonly known as: PROTONIX  Take 40 mg by mouth 2 (two) times daily.   Pirfenidone  267 MG Tabs Take 1 tab three times daily for 7 days, then 2 tabs three times daily for 7 days, then 3 tabs three times daily thereafter.   Pirfenidone  801 MG Tabs Take 1 tablet (801 mg total)  by mouth 3 (three) times daily with meals.   PROBIOTIC DAILY PO Take 1 capsule by mouth daily.   promethazine-dextromethorphan  6.25-15 MG/5ML syrup Commonly known as: PROMETHAZINE-DM TAKE 5-10 MLS ( 1-2 TEASPOONFULS) EVERY FOUR-SIX HOURS AS NEEDED FOR COUGH   Takhzyro  300 MG/2ML Sosy Generic drug: Lanadelumab -flyo INJECT 300MG  SUBCUTANEOUSLY  EVERY 2 WEEKS   temazepam  15 MG capsule Commonly known as: RESTORIL  Take 15 mg by mouth at bedtime.   Vitamin B-12 5000 MCG Tbdp Take 5,000 mcg by mouth daily.    Past Medical History:  Diagnosis Date   Abnormal EKG 02/21/2015   Acute lower GI bleeding 05/28/2019   Anginal pain (HCC) 2016   Angio-edema    Anxiety    Arthritis    CAD S/P percutaneous coronary angioplasty    NSTEMI treated with LAD PCI with DES x 03 Feb 2015 Myoview low risk Nov 2017   Cataract    Cerebellar stroke (HCC) 06/14/2021   CHF (congestive heart failure) (HCC) 2020   after surgery only   Chronic diarrhea of unknown origin    CKD (chronic kidney disease) stage 3, GFR 30-59 ml/min 12/21/2017   Colitis 05/23/2015   Coronary artery disease 05/28/2019   Diverticular hemorrhage    Diverticulosis    Dyslipidemia, goal LDL below 70 02/21/2015   hyperlipidemia    Dysrhythmia    A-fib   Enterotoxigenic Escherichia coli infection 05/24/2015   Essential hypertension 02/21/2015   Geographic tongue    GERD (gastroesophageal reflux disease)    GERD without esophagitis 06/15/2021   GI bleed 12/21/2017   Glossitis, benign migratory 12/04/2018   Gout    Hematochezia 12/21/2017   Admitted Sept 2019 with lower GI bleeding felt to be secondary to hemorrhoids   Hereditary angioedema type 2 (HCC) 07/28/2017   High cholesterol    History of kidney stones    History of non-ST elevation myocardial infarction (NSTEMI) 02/22/2015   Nov 2016   History of prolonged Q-T interval on ECG    History of prostate cancer    Hoarseness 12/04/2018   Hx of Clostridium difficile  infection    Hx of umbilical hernia repair    Hypertension    Hypothyroidism    Hypothyroidism, unspecified 06/15/2021   ILD (interstitial lung disease) (HCC) 12/17/2013   ?related to chicken farm exposure or aspiration pneumonitis from GERD Previously attributed to acute interstitial pneumonitis but based on HRCT this is likely IPF, "probable UIP"   Lower GI bleed 06/03/2019   Malignant neoplasm of prostate (HCC) 08/07/2014   Myocardial infarction [facility]) 2016   NSTEMI (non-ST elevated myocardial infarction) (HCC) 05/21/2018   Open metatarsal fracture 09/02/2020   PAF (paroxysmal atrial fibrillation) (HCC) 06/13/2018   Post CABG- discharged on Amiodarone , he will probably not need this long term   Palpitations 03/15/2015   palpitations    Peripheral vascular disease (HCC)  vericose veins   Personal history of digestive disease    gastric ulcer   Pre-diabetes    Pre-operative clearance 01/02/2018   Prostate cancer (HCC)    Prostate enlargement    S/P CABG x 3 05/28/2018    LIMA-LAD, RIMA-RCA, and Lt radial to OM.   Skin cancer    basal cell carcinoma   STEC (Shiga toxin-producing Escherichia coli) infection 05/24/2015   Stroke (cerebrum) (HCC) 06/15/2021   Tachycardia determined by examination of pulse    Thyroid  disease    Type 2 diabetes mellitus with unspecified complications (HCC)    Type 2 diabetes mellitus without complications (HCC) 06/15/2021   Urticaria     Past Surgical History:  Procedure Laterality Date   APPENDECTOMY     BACK SURGERY     CARDIAC CATHETERIZATION N/A 02/21/2015   Procedure: Left Heart Cath;  Surgeon: Avanell Leigh, MD;  Location: [facility] INVASIVE CV LAB;  Service: Cardiovascular;  Laterality: N/A;   CATARACT EXTRACTION, BILATERAL     CORONARY ARTERY BYPASS GRAFT N/A 05/24/2018   Procedure: CORONARY ARTERY BYPASS GRAFTING (CABG), ON PUMP, TIMES THREE, USING BILATERAL INTERNAL MAMMARY ARTERY AND HARVESTED LEFT RADIAL ARTERY;  Surgeon: Bartley Lightning, MD;  Location: [facility] OR;  Service: Open Heart Surgery;  Laterality: N/A;   CYSTOSCOPY WITH LITHOLAPAXY N/A 07/26/2020   Procedure: CYSTOSCOPY WITH LITHOLAPAXY WITH FULGERATION;  Surgeon: Florencio Hunting, MD;  Location: [facility] ORS;  Service: Urology;  Laterality: N/A;   HOLMIUM LASER APPLICATION N/A 07/26/2020   Procedure: HOLMIUM LASER APPLICATION;  Surgeon: Florencio Hunting, MD;  Location: [facility] ORS;  Service: Urology;  Laterality: N/A;   I & D EXTREMITY Right 09/02/2020   Procedure: IRRIGATION AND DEBRIDEMENT RIGHT 2ND AND 3RD METATARSAL;  Surgeon: Amada Backer, MD;  Location: [facility] OR;  Service: Orthopedics;  Laterality: Right;   LEFT HEART CATH AND CORONARY ANGIOGRAPHY N/A 05/22/2018   Procedure: LEFT HEART CATH AND CORONARY ANGIOGRAPHY;  Surgeon: Lucendia Rusk, MD;  Location: [facility] INVASIVE CV LAB;  Service: Cardiovascular;  Laterality: N/A;   NASAL SINUS SURGERY     ORIF TOE FRACTURE Right 09/02/2020   Procedure: OPEN REDUCTION INTERNAL FIXATION (ORIF) OF RIGHT OPEN 2ND AND 3RD METATARSAL (TOE) OPEN FRACTURE;  Surgeon: Amada Backer, MD;  Location: [facility] OR;  Service: Orthopedics;  Laterality: Right;   PROSTATE BIOPSY     RADIAL ARTERY HARVEST Left 05/24/2018   Procedure: RADIAL ARTERY HARVEST;  Surgeon: Bartley Lightning, MD;  Location: [facility] OR;  Service: Open Heart Surgery;  Laterality: Left;   right shoulder rotator cuff repair     TEE WITHOUT CARDIOVERSION N/A 05/24/2018   Procedure: TRANSESOPHAGEAL ECHOCARDIOGRAM (TEE);  Surgeon: Bartley Lightning, MD;  Location: [facility] OR;  Service: Open Heart Surgery;  Laterality: N/A;    Review of systems negative except as noted in HPI / PMHx or noted below:  Review of Systems  Constitutional: Negative.   HENT: Negative.    Eyes: Negative.   Respiratory: Negative.    Cardiovascular: Negative.   Gastrointestinal: Negative.   Genitourinary: Negative.   Musculoskeletal: Negative.   Skin: Negative.   Neurological: Negative.   Endo/Heme/Allergies: Negative.    Psychiatric/Behavioral: Negative.       Objective:   Vitals:   08/09/23 1111  BP: 136/74  Pulse: 64  Resp: 14  SpO2: 97%   Height: 5' 10.4" (178.8 cm)  Weight: 188 lb 3.2 oz (85.4 kg)   Physical Exam Constitutional:      Appearance: He is  not diaphoretic.  HENT:     Head: Normocephalic.     Right Ear: Tympanic membrane, ear canal and external ear normal.     Left Ear: Tympanic membrane, ear canal and external ear normal.     Nose: Nose normal. No mucosal edema or rhinorrhea.     Mouth/Throat:     Pharynx: Uvula midline. No oropharyngeal exudate.  Eyes:     Conjunctiva/sclera: Conjunctivae normal.  Neck:     Thyroid : No thyromegaly.     Trachea: Trachea normal. No tracheal tenderness or tracheal deviation.  Cardiovascular:     Rate and Rhythm: Normal rate and regular rhythm.     Heart sounds: Normal heart sounds, S1 normal and S2 normal. No murmur heard. Pulmonary:     Effort: No respiratory distress.     Breath sounds: Normal breath sounds. No stridor. No wheezing or rales.  Lymphadenopathy:     Head:     Right side of head: No tonsillar adenopathy.     Left side of head: No tonsillar adenopathy.     Cervical: No cervical adenopathy.  Skin:    Findings: No erythema or rash.     Nails: There is no clubbing.  Neurological:     Mental Status: He is alert.     Diagnostics: none  Assessment and Plan:   1. Hereditary angioedema type 2 (HCC)   2. Perennial allergic rhinitis   3. LPRD (laryngopharyngeal reflux disease)   4. Sicca syndrome (HCC)     1.  Continue Takhzryo every 14 days.   2.  Continue to Treat and prevent inflammation:    A.  Flonase  - 1-2 sprays each nostril once a day  3.  Continue to Treat and prevent reflux:   A.  pantoprazole  40 mg twice a day  4.  If needed:   A.  Nasal saline spray  B.  Azelastine  nose spray 1-2 times per day  C.  OTC Mucinex  DM - 2 tablets twice a day  D.  Firazyr  injection for swelling reaction  E.  Systane  eye drops  5. Return to clinic in 6 months or earlier if problem  6. Influenza = Tamiflu. Covid = Paxlovid  Jariel appears to be doing very well and he will continue on treatment for hereditary angioedema and his nasal issue and his reflux issue with the therapy noted above.  I will see him back in his clinic in 6 months or earlier if there is a problem.   Schuyler Custard, MD Allergy / Immunology [facility]

## 2023-08-13 ENCOUNTER — Encounter: Payer: Self-pay | Admitting: Allergy and Immunology

## 2023-08-13 ENCOUNTER — Telehealth: Payer: Self-pay | Admitting: *Deleted

## 2023-08-13 MED ORDER — ICATIBANT ACETATE 30 MG/3ML ~~LOC~~ SOSY: 30.0000 mg | PREFILLED_SYRINGE | Freq: Once | SUBCUTANEOUS | 3 refills | Status: AC

## 2023-08-13 NOTE — Telephone Encounter (Signed)
 Rx sent for Icatibant  for patient to have on hand has been on previously

## 2023-08-13 NOTE — Telephone Encounter (Signed)
-----   Message from [facility] R sent at 08/09/2023 11:38 AM EDT ----- Patient needs refill on Firazyr . Thank you Benn Brash, Randeen Busman

## 2023-10-04 ENCOUNTER — Other Ambulatory Visit: Payer: Self-pay | Admitting: Cardiology

## 2023-10-16 DIAGNOSIS — D841 Defects in the complement system: Secondary | ICD-10-CM | POA: Diagnosis not present

## 2023-10-16 DIAGNOSIS — J849 Interstitial pulmonary disease, unspecified: Secondary | ICD-10-CM | POA: Diagnosis not present

## 2023-10-16 DIAGNOSIS — E1169 Type 2 diabetes mellitus with other specified complication: Secondary | ICD-10-CM | POA: Diagnosis not present

## 2023-10-16 DIAGNOSIS — N1831 Chronic kidney disease, stage 3a: Secondary | ICD-10-CM | POA: Diagnosis not present

## 2023-10-16 DIAGNOSIS — E785 Hyperlipidemia, unspecified: Secondary | ICD-10-CM | POA: Diagnosis not present

## 2023-10-16 DIAGNOSIS — D649 Anemia, unspecified: Secondary | ICD-10-CM | POA: Diagnosis not present

## 2023-10-16 DIAGNOSIS — E039 Hypothyroidism, unspecified: Secondary | ICD-10-CM | POA: Diagnosis not present

## 2023-10-16 DIAGNOSIS — I700 Atherosclerosis of aorta: Secondary | ICD-10-CM | POA: Diagnosis not present

## 2023-10-16 DIAGNOSIS — E559 Vitamin D deficiency, unspecified: Secondary | ICD-10-CM | POA: Diagnosis not present

## 2023-10-16 DIAGNOSIS — E538 Deficiency of other specified B group vitamins: Secondary | ICD-10-CM | POA: Diagnosis not present

## 2023-10-16 DIAGNOSIS — I10 Essential (primary) hypertension: Secondary | ICD-10-CM | POA: Diagnosis not present

## 2023-10-16 DIAGNOSIS — C61 Malignant neoplasm of prostate: Secondary | ICD-10-CM | POA: Diagnosis not present

## 2023-10-18 ENCOUNTER — Ambulatory Visit: Attending: Cardiology | Admitting: Cardiology

## 2023-10-18 ENCOUNTER — Encounter: Payer: Self-pay | Admitting: Cardiology

## 2023-10-18 VITALS — BP 148/82 | HR 56 | Ht 73.0 in | Wt 191.8 lb

## 2023-10-18 DIAGNOSIS — E785 Hyperlipidemia, unspecified: Secondary | ICD-10-CM | POA: Insufficient documentation

## 2023-10-18 DIAGNOSIS — Z951 Presence of aortocoronary bypass graft: Secondary | ICD-10-CM | POA: Insufficient documentation

## 2023-10-18 DIAGNOSIS — Z9861 Coronary angioplasty status: Secondary | ICD-10-CM | POA: Diagnosis not present

## 2023-10-18 DIAGNOSIS — I2510 Atherosclerotic heart disease of native coronary artery without angina pectoris: Secondary | ICD-10-CM | POA: Insufficient documentation

## 2023-10-18 DIAGNOSIS — I10 Essential (primary) hypertension: Secondary | ICD-10-CM | POA: Diagnosis not present

## 2023-10-18 DIAGNOSIS — E118 Type 2 diabetes mellitus with unspecified complications: Secondary | ICD-10-CM | POA: Insufficient documentation

## 2023-10-18 NOTE — Progress Notes (Signed)
 Cardiology Office Note:    Date:  10/18/2023   ID:  Daniel Herrera, DOB [birth year], MRN [id number]  PCP:  Daniel Greig LABOR, NP  Cardiologist:  Daniel JONELLE Crape, MD   Referring MD: Daniel Greig LABOR, NP    ASSESSMENT:    1. CAD S/P percutaneous coronary angioplasty   2. Essential hypertension   3. Type 2 diabetes mellitus with unspecified complications (HCC)   4. S/P CABG x 3   5. Dyslipidemia, goal LDL below 70    PLAN:    In order of problems listed above:  Coronary artery disease: Secondary prevention stressed with the patient.  Importance of compliance with diet medication stressed and patient verbalized standing. He was advised to walk at least half an hour a day on a daily basis. Essential hypertension: Blood pressure is stable and diet was emphasized.  He mentioned his blood pressures are much better as the day goes along and he keeps track of them.  Diet and lifestyle modification emphasized. Cardiac murmur: Echocardiogram will be done to assess murmur heard on auscultation. Mixed dyslipidemia: On lipid-lowering medications followed by primary care.  He had blood work done yesterday.  I told him that goal LDL must be less than 60 and he understands.  He will to be touching care with his primary care for this. Patient will be seen in follow-up appointment in 6 months or earlier if the patient has any concerns.    Medication Adjustments/Labs and Tests Ordered: Current medicines are reviewed at length with the patient today.  Concerns regarding medicines are outlined above.  Orders Placed This Encounter  Procedures   ECHOCARDIOGRAM COMPLETE   No orders of the defined types were placed in this encounter.    No chief complaint on file.    History of Present Illness:    Daniel Herrera is a 83 y.o. male.  Patient has past medical history of coronary artery disease, essential hypertension, mixed dyslipidemia and history of stroke.  He denies any problems at this time and  takes care of of activities of daily living.  No chest pain orthopnea or PND.  Overall he leads a sedentary lifestyle.  At the time of my evaluation, the patient is alert awake oriented and in no distress.  Past Medical History:  Diagnosis Date   Abnormal EKG 02/21/2015   Acute lower GI bleeding 05/28/2019   Anginal pain (HCC) 2016   Angio-edema    Anxiety    Arthritis    CAD S/P percutaneous coronary angioplasty    NSTEMI treated with LAD PCI with DES x 03 Feb 2015 Myoview low risk Nov 2017   Cataract    Cerebellar stroke (HCC) 06/14/2021   CHF (congestive heart failure) (HCC) 2020   after surgery only   Chronic diarrhea of unknown origin    CKD (chronic kidney disease) stage 3, GFR 30-59 ml/min 12/21/2017   Colitis 05/23/2015   Coronary artery disease 05/28/2019   Diverticular hemorrhage    Diverticulosis    Dyslipidemia, goal LDL below 70 02/21/2015   hyperlipidemia    Dysrhythmia    A-fib   Enterotoxigenic Escherichia coli infection 05/24/2015   Essential hypertension 02/21/2015   Geographic tongue    GERD (gastroesophageal reflux disease)    GERD without esophagitis 06/15/2021   GI bleed 12/21/2017   Glossitis, benign migratory 12/04/2018   Gout    Hematochezia 12/21/2017   Admitted Sept 2019 with lower GI bleeding felt to be secondary to hemorrhoids   Hereditary  angioedema type 2 (HCC) 07/28/2017   High cholesterol    History of kidney stones    History of non-ST elevation myocardial infarction (NSTEMI) 02/22/2015   Nov 2016   History of prolonged Q-T interval on ECG    History of prostate cancer    Hoarseness 12/04/2018   Hx of Clostridium difficile infection    Hx of umbilical hernia repair    Hypertension    Hypothyroidism    Hypothyroidism, unspecified 06/15/2021   ILD (interstitial lung disease) (HCC) 12/17/2013   ?related to chicken farm exposure or aspiration pneumonitis from GERD Previously attributed to acute interstitial pneumonitis but based on HRCT  this is likely IPF, probable UIP   Lower GI bleed 06/03/2019   Malignant neoplasm of prostate (HCC) 08/07/2014   Myocardial infarction [facility]) 2016   NSTEMI (non-ST elevated myocardial infarction) (HCC) 05/21/2018   Open metatarsal fracture 09/02/2020   PAF (paroxysmal atrial fibrillation) (HCC) 06/13/2018   Post CABG- discharged on Amiodarone , he will probably not need this long term   Palpitations 03/15/2015   palpitations    Peripheral vascular disease (HCC)    vericose veins   Personal history of digestive disease    gastric ulcer   Pre-diabetes    Pre-operative clearance 01/02/2018   Prostate cancer (HCC)    Prostate enlargement    S/P CABG x 3 05/28/2018    LIMA-LAD, RIMA-RCA, and Lt radial to OM.   Skin cancer    basal cell carcinoma   STEC (Shiga toxin-producing Escherichia coli) infection 05/24/2015   Stroke (cerebrum) (HCC) 06/15/2021   Tachycardia determined by examination of pulse    Thyroid  disease    Type 2 diabetes mellitus with unspecified complications (HCC)    Type 2 diabetes mellitus without complications (HCC) 06/15/2021   Urticaria     Past Surgical History:  Procedure Laterality Date   APPENDECTOMY     BACK SURGERY     CARDIAC CATHETERIZATION N/A 02/21/2015   Procedure: Left Heart Cath;  Surgeon: Dorn JINNY Lesches, MD;  Location: [facility] INVASIVE CV LAB;  Service: Cardiovascular;  Laterality: N/A;   CATARACT EXTRACTION, BILATERAL     CORONARY ARTERY BYPASS GRAFT N/A 05/24/2018   Procedure: CORONARY ARTERY BYPASS GRAFTING (CABG), ON PUMP, TIMES THREE, USING BILATERAL INTERNAL MAMMARY ARTERY AND HARVESTED LEFT RADIAL ARTERY;  Surgeon: Lucas Dorise POUR, MD;  Location: [facility] OR;  Service: Open Heart Surgery;  Laterality: N/A;   CYSTOSCOPY WITH LITHOLAPAXY N/A 07/26/2020   Procedure: CYSTOSCOPY WITH LITHOLAPAXY WITH FULGERATION;  Surgeon: Renda Glance, MD;  Location: [facility] ORS;  Service: Urology;  Laterality: N/A;   HOLMIUM LASER APPLICATION N/A 07/26/2020   Procedure:  HOLMIUM LASER APPLICATION;  Surgeon: Renda Glance, MD;  Location: [facility] ORS;  Service: Urology;  Laterality: N/A;   I & D EXTREMITY Right 09/02/2020   Procedure: IRRIGATION AND DEBRIDEMENT RIGHT 2ND AND 3RD METATARSAL;  Surgeon: Kit Rush, MD;  Location: [facility] OR;  Service: Orthopedics;  Laterality: Right;   LEFT HEART CATH AND CORONARY ANGIOGRAPHY N/A 05/22/2018   Procedure: LEFT HEART CATH AND CORONARY ANGIOGRAPHY;  Surgeon: Dann Candyce RAMAN, MD;  Location: [facility] INVASIVE CV LAB;  Service: Cardiovascular;  Laterality: N/A;   NASAL SINUS SURGERY     ORIF TOE FRACTURE Right 09/02/2020   Procedure: OPEN REDUCTION INTERNAL FIXATION (ORIF) OF RIGHT OPEN 2ND AND 3RD METATARSAL (TOE) OPEN FRACTURE;  Surgeon: Kit Rush, MD;  Location: [facility] OR;  Service: Orthopedics;  Laterality: Right;   PROSTATE BIOPSY     RADIAL  ARTERY HARVEST Left 05/24/2018   Procedure: RADIAL ARTERY HARVEST;  Surgeon: Lucas Dorise POUR, MD;  Location: [facility] OR;  Service: Open Heart Surgery;  Laterality: Left;   right shoulder rotator cuff repair     TEE WITHOUT CARDIOVERSION N/A 05/24/2018   Procedure: TRANSESOPHAGEAL ECHOCARDIOGRAM (TEE);  Surgeon: Lucas Dorise POUR, MD;  Location: [facility] OR;  Service: Open Heart Surgery;  Laterality: N/A;    Current Medications: Current Meds  Medication Sig   amLODipine  (NORVASC ) 2.5 MG tablet TAKE 1 TABLET BY MOUTH EVERY DAY   aspirin  81 MG EC tablet Take 1 tablet (81 mg total) by mouth daily.   Azelastine  HCl 137 MCG/SPRAY SOLN Place 1 spray into both nostrils 2 (two) times daily.   betamethasone  valerate ointment (VALISONE ) 0.1 % Apply 1 application topically 2 (two) times daily as needed for rash.   busPIRone (BUSPAR) 5 MG tablet Take 5 mg by mouth 3 (three) times daily as needed.   Cyanocobalamin  (VITAMIN B-12) 5000 MCG TBDP Take 5,000 mcg by mouth daily.    dextromethorphan -guaiFENesin  (MUCINEX  DM) 30-600 MG 12hr tablet Take 1 tablet by mouth in the morning and at bedtime.   fluoruracil (CARAC ) 0.5 %  cream Apply to skin cancer for 1-7 days during 'flare up' as directed.   fluticasone  (FLONASE ) 50 MCG/ACT nasal spray Place 1 spray into both nostrils 2 (two) times daily.   levothyroxine  (SYNTHROID ) 100 MCG tablet Take 100 mcg by mouth daily before breakfast.   losartan  (COZAAR ) 25 MG tablet Take 1 tablet (25 mg total) by mouth daily.   metoprolol  succinate (TOPROL -XL) 25 MG 24 hr tablet TAKE 1 TABLET BY MOUTH TWICE A DAY   Multiple Vitamins-Minerals (CENTRUM PO) Take by mouth.   nabumetone  (RELAFEN ) 750 MG tablet Take 750 mg by mouth 2 (two) times daily.   nitroGLYCERIN  (NITROSTAT ) 0.4 MG SL tablet Place 0.4 mg under the tongue every 5 (five) minutes as needed for chest pain.   OVER THE COUNTER MEDICATION Take 1 capsule by mouth in the morning and at bedtime. Health Plus/Super Colon Cleanse   pantoprazole  (PROTONIX ) 40 MG tablet Take 40 mg by mouth 2 (two) times daily.   Pitavastatin  Calcium  (LIVALO ) 4 MG TABS Take 4 mg by mouth every evening.   Probiotic Product (PROBIOTIC DAILY PO) Take 1 capsule by mouth daily.   promethazine-dextromethorphan  (PROMETHAZINE-DM) 6.25-15 MG/5ML syrup TAKE 5-10 MLS ( 1-2 TEASPOONFULS) EVERY FOUR-SIX HOURS AS NEEDED FOR COUGH   TAKHZYRO  300 MG/2ML SOSY INJECT 300MG  SUBCUTANEOUSLY  EVERY 2 WEEKS   temazepam  (RESTORIL ) 15 MG capsule Take 15 mg by mouth at bedtime.   [DISCONTINUED] Pirfenidone  267 MG TABS Take 1 tab three times daily for 7 days, then 2 tabs three times daily for 7 days, then 3 tabs three times daily thereafter.   [DISCONTINUED] Pirfenidone  801 MG TABS Take 1 tablet (801 mg total) by mouth 3 (three) times daily with meals.     Allergies:   Lisinopril , Amlodipine , Oxycodone , Tape, Benadryl [diphenhydramine hcl], Darvocet [propoxyphene n-acetaminophen ], Darvon [propoxyphene hcl], and Tylenol  [acetaminophen ]   Social History   Socioeconomic History   Marital status: Married    Spouse name: Not on file   Number of children: Not on file   Years of  education: Not on file   Highest education level: Not on file  Occupational History   Occupation: retired  Tobacco Use   Smoking status: Never   Smokeless tobacco: Never  Vaping Use   Vaping status: Never Used  Substance and Sexual  Activity   Alcohol  use: Never   Drug use: No   Sexual activity: Not on file  Other Topics Concern   Not on file  Social History Narrative   Not on file   Social Drivers of Health   Financial Resource Strain: Not on file  Food Insecurity: No Food Insecurity (06/02/2019)   Hunger Vital Sign    Worried About Running Out of Food in the Last Year: Never true    Ran Out of Food in the Last Year: Never true  Transportation Needs: No Transportation Needs (06/02/2019)   PRAPARE - Administrator, Civil Service (Medical): No    Lack of Transportation (Non-Medical): No  Physical Activity: Inactive (09/30/2018)   Received from [facility]   Exercise Vital Sign    On average, how many days per week do you engage in moderate to strenuous exercise (like a brisk walk)?: 0 days    On average, how many minutes do you engage in exercise at this level?: 0 min  Stress: No Stress Concern Present (09/30/2018)   Received from [facility] Occup[facility] - Occupational Stress Questionnaire    Feeling of Stress : Not at all  Social Connections: Socially Integrated (09/30/2018)   Received from [facility]   Social Connection and Isolation Panel    In a typical week, how many times do you talk on the phone with family, friends, or neighbors?: More than three times a week    How often do you get together with friends or relatives?: More than three times a week    How often do you attend church or religious services?: More than 4 times per year    Do you belong to any clubs or organizations such as church groups, unions, fraternal or athletic groups, or school groups?: Yes    How often do you attend meetings of the clubs or  organizations you belong to?: 1 to 4 times per year    Are you married, widowed, divorced, separated, never married, or living with a partner?: Married     Family History: The patient's family history includes Cancer in his father, mother, and sister; Stomach cancer in his mother. There is no history of Sleep apnea.  ROS:   Please see the history of present illness.    All other systems reviewed and are negative.  EKGs/Labs/Other Studies Reviewed:    The following studies were reviewed today: .SABRA   I discussed my findings with the patient at length   Recent Labs: 01/08/2023: BUN 21; Creatinine, Ser 1.84; Hemoglobin 13.8; Platelets 157; Potassium 4.0; Sodium 140 01/09/2023: B Natriuretic Peptide 246.7  Recent Lipid Panel    Component Value Date/Time   CHOL 156 06/15/2021 0308   CHOL 183 05/23/2019 0826   TRIG 180 (H) 06/15/2021 0308   HDL 30 (L) 06/15/2021 0308   HDL 44 05/23/2019 0826   CHOLHDL 5.2 06/15/2021 0308   VLDL 36 06/15/2021 0308   LDLCALC 90 06/15/2021 0308   LDLCALC 108 (H) 05/23/2019 0826    Physical Exam:    VS:  BP (!) 148/82   Pulse (!) 56   Ht 6' 1 (1.854 m)   Wt 191 lb 12.8 oz (87 kg)   SpO2 98%   BMI 25.30 kg/m     Wt Readings from Last 3 Encounters:  10/18/23 191 lb 12.8 oz (87 kg)  08/09/23 188 lb 3.2 oz (85.4 kg)  07/05/23 192 lb (  87.1 kg)     GEN: Patient is in no acute distress HEENT: Normal NECK: No JVD; No carotid bruits LYMPHATICS: No lymphadenopathy CARDIAC: Hear sounds regular, 2/6 systolic murmur at the apex. RESPIRATORY:  Clear to auscultation without rales, wheezing or rhonchi  ABDOMEN: Soft, non-tender, non-distended MUSCULOSKELETAL:  No edema; No deformity  SKIN: Warm and dry NEUROLOGIC:  Alert and oriented x 3 PSYCHIATRIC:  Normal affect   Signed, Daniel JONELLE Crape, MD  10/18/2023 8:37 AM    [facility]

## 2023-10-18 NOTE — Patient Instructions (Signed)
 Medication Instructions:  Your physician recommends that you continue on your current medications as directed. Please refer to the Current Medication list given to you today.  *If you need a refill on your cardiac medications before your next appointment, please call your pharmacy*   Lab Work: None ordered If you have labs (blood work) drawn today and your tests are completely normal, you will receive your results only by: MyChart Message (if you have MyChart) OR A paper copy in the mail If you have any lab test that is abnormal or we need to change your treatment, we will call you to review the results.  Testing/Procedures: Your physician has requested that you have an echocardiogram. Echocardiography is a painless test that uses sound waves to create images of your heart. It provides your doctor with information about the size and shape of your heart and how well your heart's chambers and valves are working. This procedure takes approximately one hour. There are no restrictions for this procedure. Please do NOT wear cologne, perfume, aftershave, or lotions (deodorant is allowed). Please arrive 15 minutes prior to your appointment time.  Please note: We ask at that you not bring children with you during ultrasound (echo/ vascular) testing. Due to room size and safety concerns, children are not allowed in the ultrasound rooms during exams. Our front office staff cannot provide observation of children in our lobby area while testing is being conducted. An adult accompanying a patient to their appointment will only be allowed in the ultrasound room at the discretion of the ultrasound technician under special circumstances. We apologize for any inconvenience.  Follow-Up: At [facility], you and your health needs are our priority.  As part of our continuing mission to provide you with exceptional heart care, we have created designated Provider Care Teams.  These Care Teams include your primary  Cardiologist (physician) and Advanced Practice Providers (APPs -  Physician Assistants and Nurse Practitioners) who all work together to provide you with the care you need, when you need it.  We recommend signing up for the patient portal called MyChart.  Sign up information is provided on this After Visit Summary.  MyChart is used to connect with patients for Virtual Visits (Telemedicine).  Patients are able to view lab/test results, encounter notes, upcoming appointments, etc.  Non-urgent messages can be sent to your provider as well.   To learn more about what you can do with MyChart, go to [email or web address].    Your next appointment:   9 month(s)  The format for your next appointment:   In Person  Provider:   Jennifer Crape, MD   Other Instructions Echocardiogram An echocardiogram is a test that uses sound waves (ultrasound) to produce images of the heart. Images from an echocardiogram can provide important information about: Heart size and shape. The size and thickness and movement of your heart's walls. Heart muscle function and strength. Heart valve function or if you have stenosis. Stenosis is when the heart valves are too narrow. If blood is flowing backward through the heart valves (regurgitation). A tumor or infectious growth around the heart valves. Areas of heart muscle that are not working well because of poor blood flow or injury from a heart attack. Aneurysm detection. An aneurysm is a weak or damaged part of an artery wall. The wall bulges out from the normal force of blood pumping through the body. Tell a health care provider about: Any allergies you have. All medicines you are taking, including vitamins, herbs,  eye drops, creams, and over-the-counter medicines. Any blood disorders you have. Any surgeries you have had. Any medical conditions you have. Whether you are pregnant or may be pregnant. What are the risks? Generally, this is a safe test. However,  problems may occur, including an allergic reaction to dye (contrast) that may be used during the test. What happens before the test? No specific preparation is needed. You may eat and drink normally. What happens during the test? You will take off your clothes from the waist up and put on a hospital gown. Electrodes or electrocardiogram (ECG)patches may be placed on your chest. The electrodes or patches are then connected to a device that monitors your heart rate and rhythm. You will lie down on a table for an ultrasound exam. A gel will be applied to your chest to help sound waves pass through your skin. A handheld device, called a transducer, will be pressed against your chest and moved over your heart. The transducer produces sound waves that travel to your heart and bounce back (or echo back) to the transducer. These sound waves will be captured in real-time and changed into images of your heart that can be viewed on a video monitor. The images will be recorded on a computer and reviewed by your health care provider. You may be asked to change positions or hold your breath for a short time. This makes it easier to get different views or better views of your heart. In some cases, you may receive contrast through an IV in one of your veins. This can improve the quality of the pictures from your heart. The procedure may vary among health care providers and hospitals.   What can I expect after the test? You may return to your normal, everyday life, including diet, activities, and medicines, unless your health care provider tells you not to do that. Follow these instructions at home: It is up to you to get the results of your test. Ask your health care provider, or the department that is doing the test, when your results will be ready. Keep all follow-up visits. This is important. Summary An echocardiogram is a test that uses sound waves (ultrasound) to produce images of the heart. Images from an  echocardiogram can provide important information about the size and shape of your heart, heart muscle function, heart valve function, and other possible heart problems. You do not need to do anything to prepare before this test. You may eat and drink normally. After the echocardiogram is completed, you may return to your normal, everyday life, unless your health care provider tells you not to do that. This information is not intended to replace advice given to you by your health care provider. Make sure you discuss any questions you have with your health care provider. Document Revised: 11/11/2019 Document Reviewed: 11/11/2019 Elsevier Patient Education  2021 Elsevier Inc.   Important Information About Sugar

## 2023-10-19 ENCOUNTER — Encounter (HOSPITAL_BASED_OUTPATIENT_CLINIC_OR_DEPARTMENT_OTHER): Payer: Self-pay | Admitting: Pulmonary Disease

## 2023-10-19 ENCOUNTER — Ambulatory Visit (INDEPENDENT_AMBULATORY_CARE_PROVIDER_SITE_OTHER): Admitting: Pulmonary Disease

## 2023-10-19 VITALS — BP 124/76 | HR 60 | Ht 73.0 in | Wt 193.6 lb

## 2023-10-19 DIAGNOSIS — Z8616 Personal history of COVID-19: Secondary | ICD-10-CM

## 2023-10-19 DIAGNOSIS — I10 Essential (primary) hypertension: Secondary | ICD-10-CM

## 2023-10-19 DIAGNOSIS — J84112 Idiopathic pulmonary fibrosis: Secondary | ICD-10-CM

## 2023-10-19 NOTE — Patient Instructions (Signed)
  VISIT SUMMARY: You came in today for a follow-up visit regarding your idiopathic pulmonary fibrosis (IPF). You have not experienced any recent episodes of shortness of breath and remain active with your daily activities. We also reviewed your history of COVID-19 and pneumonia, as well as your hypertension management.  YOUR PLAN: -IDIOPATHIC PULMONARY FIBROSIS (IPF): Idiopathic Pulmonary Fibrosis (IPF) is a lung disease that causes scarring of the lungs for an unknown reason. You have experienced some deterioration over time but have not had significant worsening recently. Since you did not tolerate the antifibrotic medication, we will not pursue further antifibrotic treatments. We will check your blood oxygen saturation today and monitor your oxygen levels during each visit by checking your walk. Please follow up in six months.   INSTRUCTIONS: Please follow up in six months for your next visit. We will continue to monitor your oxygen levels and overall health.                      Contains text generated by [employer]bridge.                                 Contains text generated by Abridge.

## 2023-10-19 NOTE — Progress Notes (Signed)
 Subjective:    Patient ID: Daniel Herrera, male    DOB: Feb 07, 1941, 83 y.o.   MRN: [id number]   83 yo never smoker for FU of ILD/probable UIP -progressive   PMH- prostate CA, C diff colitis  SVTs that stop with vagal maneuvers,   Recurrent angioedema - on lanadelumab  which is a monoclonal antibody to plasma kallilrein/bradykinin CABG 06/2018 ,MI 06/2021 and cryptogenic stroke in the cerebellum  chronic hoarseness for many years, ENT evaluation  negative   He presented in 2015 with dyspnea for a few months, PFTs were normal except for mildly decreased DLCO .  CT chest at [facility] showed mild interstitial prominence. 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  We discussed antifibrotic's on his past visits he  decided against starting. OV 05/2023 He desaturate to 84% on ambulation although recovers with resting to 95%.   Discussed the use of AI scribe software for clinical note transcription with the patient, who gave verbal consent to proceed.  History of Present Illness Daniel Herrera is an 83 year old male with probable idiopathic pulmonary fibrosis who presents for follow-up.  He has not experienced any recent episodes of dyspnea since his last visit. 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  He discontinued an antifibrotic medication -perfenidone -due to chest pain and no longer receives prescriptions for it. He uses Mucinex  and promethazine cough syrup occasionally to manage morning mucus production.  He manages his blood pressure with losartan , which is effective. He declined an implantable cardiac device after discussions with his cardiologist.   Did not desat on walking today - sat 98%  Significant tests/ events reviewed   HRCT chest 01/2023 probable UIP with definite worsening  since 2015 HRCT chest 12/2021 > unchanged ILD, probable UIP HRCT 09/2020 >> 'probable UIP' worse compared to 2015   HRCT chest 05/2019 -probable UIP, no definite honeycombing   CT chest without contrast 11/2013  showed interstitial prominence particularly apical with mild bronchiectasis, small patchy infiltrate was noted in the right lower lobe with a tiny effusion. ESR ,CCP neg     PFTs 07/2023 >> drop in FVC to 77% , drop in DLCO 12.10/49% PFTs 09/2019 FVC stable at 94%, DLCO stable at 16.6/62% compared to 15.6 and 58% in 2020   PFTs 02/2014 - nml FEV1/ FVC & ratio, TLC 81%, DLCO 65%  Review of Systems  neg for any significant sore throat, dysphagia, itching, sneezing, nasal congestion or excess/ purulent secretions, fever, chills, sweats, unintended wt loss, pleuritic or exertional cp, hempoptysis, orthopnea pnd or change in chronic leg swelling. Also denies presyncope, palpitations, heartburn, abdominal pain, nausea, vomiting, diarrhea or change in bowel or urinary habits, dysuria,hematuria, rash, arthralgias, visual complaints, headache, numbness weakness or ataxia.      Objective:   Physical Exam  Gen. Pleasant, well-nourished, in no distress ENT - no thrush, no pallor/icterus,no post nasal drip Neck: No JVD, no thyromegaly, no carotid bruits Lungs: no use of accessory muscles, no dullness to percussion, BB rales dryscular: Rhythm regular, heart sounds  normal, no murmurs or gallops, no peripheral edema Musculoskeletal: No deformities, no cyanosis or clubbing        Assessment & Plan:   Assessment and Plan Assessment & Plan Idiopathic Pulmonary Fibrosis (IPF) Probable IPF with some deterioration over time. He did not tolerate antifibrotic medication due  to chest pain and has opted against further antifibrotic treatments. 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 - Check blood oxygen  saturation today -did not tolerate antifibrotics - Monitor oxygen levels during each visit by checking his walk - Follow up in six months  COVID-19 and Pneumonia He reports previous COVID-19 and pneumonia, which were challenging to recover from, but he has not experienced significant breathing issues since recovery.  Hypertension Hypertension is managed with losartan , effectively controlling his blood pressure. He reports previous elevation, now well-managed with the current medication regimen.

## 2023-10-23 DIAGNOSIS — C61 Malignant neoplasm of prostate: Secondary | ICD-10-CM | POA: Diagnosis not present

## 2023-10-23 DIAGNOSIS — N401 Enlarged prostate with lower urinary tract symptoms: Secondary | ICD-10-CM | POA: Diagnosis not present

## 2023-10-23 DIAGNOSIS — R351 Nocturia: Secondary | ICD-10-CM | POA: Diagnosis not present

## 2023-10-30 ENCOUNTER — Telehealth: Payer: Self-pay | Admitting: *Deleted

## 2023-10-30 NOTE — Telephone Encounter (Signed)
 Damien from pharmacy called regarding Firazyr , she has been trying to get ahold of him to renew his savings enrollment. He needs to return her called at [phone].

## 2023-11-07 ENCOUNTER — Ambulatory Visit

## 2023-11-16 NOTE — Progress Notes (Signed)
 Per initial BIV results the pt COULD fill through [facility] and a New Program was created. However, pt ultimately declined enrolling into our [facility] program as he was already filling another medication through [facility] and requested that his Pirfenidone  be sent there as well. (See below for details)

## 2023-11-23 ENCOUNTER — Encounter (HOSPITAL_COMMUNITY): Payer: Self-pay | Admitting: *Deleted

## 2023-11-23 ENCOUNTER — Inpatient Hospital Stay (HOSPITAL_COMMUNITY)
Admission: AD | Admit: 2023-11-23 | Discharge: 2023-11-27 | DRG: 061 | Disposition: A | Discharge status: Rehabilitation Facility | Attending: Neurology | Admitting: Neurology

## 2023-11-23 ENCOUNTER — Other Ambulatory Visit: Payer: Self-pay

## 2023-11-23 ENCOUNTER — Emergency Department (HOSPITAL_COMMUNITY)

## 2023-11-23 DIAGNOSIS — M19042 Primary osteoarthritis, left hand: Secondary | ICD-10-CM | POA: Diagnosis not present

## 2023-11-23 DIAGNOSIS — G8194 Hemiplegia, unspecified affecting left nondominant side: Secondary | ICD-10-CM | POA: Diagnosis present

## 2023-11-23 DIAGNOSIS — Z85828 Personal history of other malignant neoplasm of skin: Secondary | ICD-10-CM

## 2023-11-23 DIAGNOSIS — N183 Chronic kidney disease, stage 3 unspecified: Secondary | ICD-10-CM | POA: Diagnosis not present

## 2023-11-23 DIAGNOSIS — Z8546 Personal history of malignant neoplasm of prostate: Secondary | ICD-10-CM

## 2023-11-23 DIAGNOSIS — I672 Cerebral atherosclerosis: Secondary | ICD-10-CM | POA: Diagnosis present

## 2023-11-23 DIAGNOSIS — R29709 NIHSS score 9: Secondary | ICD-10-CM | POA: Diagnosis present

## 2023-11-23 DIAGNOSIS — G8929 Other chronic pain: Secondary | ICD-10-CM | POA: Diagnosis not present

## 2023-11-23 DIAGNOSIS — D841 Defects in the complement system: Secondary | ICD-10-CM | POA: Diagnosis present

## 2023-11-23 DIAGNOSIS — I6503 Occlusion and stenosis of bilateral vertebral arteries: Secondary | ICD-10-CM | POA: Diagnosis not present

## 2023-11-23 DIAGNOSIS — E039 Hypothyroidism, unspecified: Secondary | ICD-10-CM | POA: Diagnosis present

## 2023-11-23 DIAGNOSIS — Z955 Presence of coronary angioplasty implant and graft: Secondary | ICD-10-CM

## 2023-11-23 DIAGNOSIS — R4701 Aphasia: Secondary | ICD-10-CM | POA: Diagnosis present

## 2023-11-23 DIAGNOSIS — J84112 Idiopathic pulmonary fibrosis: Secondary | ICD-10-CM | POA: Diagnosis present

## 2023-11-23 DIAGNOSIS — R1310 Dysphagia, unspecified: Secondary | ICD-10-CM | POA: Diagnosis present

## 2023-11-23 DIAGNOSIS — Z951 Presence of aortocoronary bypass graft: Secondary | ICD-10-CM

## 2023-11-23 DIAGNOSIS — N400 Enlarged prostate without lower urinary tract symptoms: Secondary | ICD-10-CM | POA: Diagnosis not present

## 2023-11-23 DIAGNOSIS — Z794 Long term (current) use of insulin: Secondary | ICD-10-CM | POA: Diagnosis not present

## 2023-11-23 DIAGNOSIS — J849 Interstitial pulmonary disease, unspecified: Secondary | ICD-10-CM | POA: Diagnosis not present

## 2023-11-23 DIAGNOSIS — G4733 Obstructive sleep apnea (adult) (pediatric): Secondary | ICD-10-CM | POA: Diagnosis not present

## 2023-11-23 DIAGNOSIS — I6601 Occlusion and stenosis of right middle cerebral artery: Secondary | ICD-10-CM | POA: Diagnosis not present

## 2023-11-23 DIAGNOSIS — M19041 Primary osteoarthritis, right hand: Secondary | ICD-10-CM | POA: Diagnosis not present

## 2023-11-23 DIAGNOSIS — M19071 Primary osteoarthritis, right ankle and foot: Secondary | ICD-10-CM | POA: Diagnosis not present

## 2023-11-23 DIAGNOSIS — R29701 NIHSS score 1: Secondary | ICD-10-CM | POA: Diagnosis not present

## 2023-11-23 DIAGNOSIS — M19072 Primary osteoarthritis, left ankle and foot: Secondary | ICD-10-CM | POA: Diagnosis not present

## 2023-11-23 DIAGNOSIS — N1832 Chronic kidney disease, stage 3b: Secondary | ICD-10-CM | POA: Diagnosis not present

## 2023-11-23 DIAGNOSIS — R29703 NIHSS score 3: Secondary | ICD-10-CM | POA: Diagnosis not present

## 2023-11-23 DIAGNOSIS — I63511 Cerebral infarction due to unspecified occlusion or stenosis of right middle cerebral artery: Secondary | ICD-10-CM | POA: Diagnosis not present

## 2023-11-23 DIAGNOSIS — I480 Paroxysmal atrial fibrillation: Secondary | ICD-10-CM | POA: Diagnosis not present

## 2023-11-23 DIAGNOSIS — R471 Dysarthria and anarthria: Secondary | ICD-10-CM | POA: Diagnosis present

## 2023-11-23 DIAGNOSIS — I639 Cerebral infarction, unspecified: Principal | ICD-10-CM | POA: Diagnosis present

## 2023-11-23 DIAGNOSIS — R278 Other lack of coordination: Secondary | ICD-10-CM | POA: Diagnosis present

## 2023-11-23 DIAGNOSIS — I69391 Dysphagia following cerebral infarction: Secondary | ICD-10-CM | POA: Diagnosis not present

## 2023-11-23 DIAGNOSIS — I6350 Cerebral infarction due to unspecified occlusion or stenosis of unspecified cerebral artery: Secondary | ICD-10-CM | POA: Diagnosis not present

## 2023-11-23 DIAGNOSIS — K5900 Constipation, unspecified: Secondary | ICD-10-CM | POA: Diagnosis not present

## 2023-11-23 DIAGNOSIS — F419 Anxiety disorder, unspecified: Secondary | ICD-10-CM | POA: Diagnosis present

## 2023-11-23 DIAGNOSIS — Z885 Allergy status to narcotic agent status: Secondary | ICD-10-CM

## 2023-11-23 DIAGNOSIS — E7800 Pure hypercholesterolemia, unspecified: Secondary | ICD-10-CM | POA: Diagnosis not present

## 2023-11-23 DIAGNOSIS — K219 Gastro-esophageal reflux disease without esophagitis: Secondary | ICD-10-CM | POA: Diagnosis present

## 2023-11-23 DIAGNOSIS — I6381 Other cerebral infarction due to occlusion or stenosis of small artery: Principal | ICD-10-CM | POA: Diagnosis present

## 2023-11-23 DIAGNOSIS — I2510 Atherosclerotic heart disease of native coronary artery without angina pectoris: Secondary | ICD-10-CM | POA: Diagnosis present

## 2023-11-23 DIAGNOSIS — I6389 Other cerebral infarction: Secondary | ICD-10-CM | POA: Diagnosis not present

## 2023-11-23 DIAGNOSIS — I130 Hypertensive heart and chronic kidney disease with heart failure and stage 1 through stage 4 chronic kidney disease, or unspecified chronic kidney disease: Secondary | ICD-10-CM | POA: Diagnosis present

## 2023-11-23 DIAGNOSIS — I509 Heart failure, unspecified: Secondary | ICD-10-CM | POA: Diagnosis present

## 2023-11-23 DIAGNOSIS — I6612 Occlusion and stenosis of left anterior cerebral artery: Secondary | ICD-10-CM | POA: Diagnosis present

## 2023-11-23 DIAGNOSIS — Z8711 Personal history of peptic ulcer disease: Secondary | ICD-10-CM

## 2023-11-23 DIAGNOSIS — I7090 Unspecified atherosclerosis: Secondary | ICD-10-CM | POA: Diagnosis not present

## 2023-11-23 DIAGNOSIS — R319 Hematuria, unspecified: Secondary | ICD-10-CM | POA: Diagnosis present

## 2023-11-23 DIAGNOSIS — E1122 Type 2 diabetes mellitus with diabetic chronic kidney disease: Secondary | ICD-10-CM | POA: Diagnosis present

## 2023-11-23 DIAGNOSIS — I69354 Hemiplegia and hemiparesis following cerebral infarction affecting left non-dominant side: Secondary | ICD-10-CM | POA: Diagnosis not present

## 2023-11-23 DIAGNOSIS — I69322 Dysarthria following cerebral infarction: Secondary | ICD-10-CM | POA: Diagnosis not present

## 2023-11-23 DIAGNOSIS — I6621 Occlusion and stenosis of right posterior cerebral artery: Secondary | ICD-10-CM | POA: Diagnosis present

## 2023-11-23 DIAGNOSIS — W19XXXA Unspecified fall, initial encounter: Secondary | ICD-10-CM

## 2023-11-23 DIAGNOSIS — Z87442 Personal history of urinary calculi: Secondary | ICD-10-CM

## 2023-11-23 DIAGNOSIS — M25512 Pain in left shoulder: Secondary | ICD-10-CM | POA: Diagnosis not present

## 2023-11-23 DIAGNOSIS — M109 Gout, unspecified: Secondary | ICD-10-CM | POA: Diagnosis present

## 2023-11-23 DIAGNOSIS — Z751 Person awaiting admission to adequate facility elsewhere: Secondary | ICD-10-CM

## 2023-11-23 DIAGNOSIS — I252 Old myocardial infarction: Secondary | ICD-10-CM

## 2023-11-23 DIAGNOSIS — Z888 Allergy status to other drugs, medicaments and biological substances status: Secondary | ICD-10-CM

## 2023-11-23 DIAGNOSIS — I6502 Occlusion and stenosis of left vertebral artery: Secondary | ICD-10-CM | POA: Diagnosis present

## 2023-11-23 DIAGNOSIS — Z7982 Long term (current) use of aspirin: Secondary | ICD-10-CM

## 2023-11-23 DIAGNOSIS — Z886 Allergy status to analgesic agent status: Secondary | ICD-10-CM

## 2023-11-23 DIAGNOSIS — R531 Weakness: Secondary | ICD-10-CM | POA: Diagnosis not present

## 2023-11-23 DIAGNOSIS — Z800 Family history of malignant neoplasm of digestive organs: Secondary | ICD-10-CM

## 2023-11-23 DIAGNOSIS — I609 Nontraumatic subarachnoid hemorrhage, unspecified: Secondary | ICD-10-CM | POA: Diagnosis present

## 2023-11-23 DIAGNOSIS — E118 Type 2 diabetes mellitus with unspecified complications: Secondary | ICD-10-CM | POA: Diagnosis not present

## 2023-11-23 DIAGNOSIS — R29818 Other symptoms and signs involving the nervous system: Secondary | ICD-10-CM | POA: Diagnosis not present

## 2023-11-23 DIAGNOSIS — Z8673 Personal history of transient ischemic attack (TIA), and cerebral infarction without residual deficits: Secondary | ICD-10-CM

## 2023-11-23 DIAGNOSIS — Z923 Personal history of irradiation: Secondary | ICD-10-CM

## 2023-11-23 DIAGNOSIS — I69393 Ataxia following cerebral infarction: Secondary | ICD-10-CM | POA: Diagnosis not present

## 2023-11-23 DIAGNOSIS — Z8679 Personal history of other diseases of the circulatory system: Secondary | ICD-10-CM | POA: Diagnosis not present

## 2023-11-23 DIAGNOSIS — Z79890 Hormone replacement therapy (postmenopausal): Secondary | ICD-10-CM | POA: Diagnosis not present

## 2023-11-23 DIAGNOSIS — Z79899 Other long term (current) drug therapy: Secondary | ICD-10-CM

## 2023-11-23 DIAGNOSIS — I63331 Cerebral infarction due to thrombosis of right posterior cerebral artery: Secondary | ICD-10-CM | POA: Diagnosis not present

## 2023-11-23 DIAGNOSIS — E1151 Type 2 diabetes mellitus with diabetic peripheral angiopathy without gangrene: Secondary | ICD-10-CM | POA: Diagnosis present

## 2023-11-23 DIAGNOSIS — I69392 Facial weakness following cerebral infarction: Secondary | ICD-10-CM | POA: Diagnosis not present

## 2023-11-23 DIAGNOSIS — I6782 Cerebral ischemia: Secondary | ICD-10-CM | POA: Diagnosis not present

## 2023-11-23 DIAGNOSIS — I440 Atrioventricular block, first degree: Secondary | ICD-10-CM | POA: Diagnosis present

## 2023-11-23 DIAGNOSIS — I10 Essential (primary) hypertension: Secondary | ICD-10-CM | POA: Diagnosis not present

## 2023-11-23 LAB — CBC
HCT: 39.7 % (ref 39.0–52.0)
Hemoglobin: 13.4 g/dL (ref 13.0–17.0)
MCH: 32 pg (ref 26.0–34.0)
MCHC: 33.8 g/dL (ref 30.0–36.0)
MCV: 94.7 fL (ref 80.0–100.0)
Platelets: 182 K/uL (ref 150–400)
RBC: 4.19 MIL/uL — ABNORMAL LOW (ref 4.22–5.81)
RDW: 12.4 % (ref 11.5–15.5)
WBC: 5.8 K/uL (ref 4.0–10.5)
nRBC: 0 % (ref 0.0–0.2)

## 2023-11-23 LAB — COMPREHENSIVE METABOLIC PANEL WITH GFR
ALT: 22 U/L (ref 0–44)
AST: 31 U/L (ref 15–41)
Albumin: 4.1 g/dL (ref 3.5–5.0)
Alkaline Phosphatase: 66 U/L (ref 38–126)
Anion gap: 9 (ref 5–15)
BUN: 22 mg/dL (ref 8–23)
CO2: 25 mmol/L (ref 22–32)
Calcium: 9.8 mg/dL (ref 8.9–10.3)
Chloride: 105 mmol/L (ref 98–111)
Creatinine, Ser: 1.84 mg/dL — ABNORMAL HIGH (ref 0.61–1.24)
GFR, Estimated: 35 mL/min — ABNORMAL LOW (ref >60)
Glucose, Bld: 125 mg/dL — ABNORMAL HIGH (ref 70–99)
Potassium: 4.3 mmol/L (ref 3.5–5.1)
Sodium: 139 mmol/L (ref 135–145)
Total Bilirubin: 0.7 mg/dL (ref 0.0–1.2)
Total Protein: 6.9 g/dL (ref 6.5–8.1)

## 2023-11-23 LAB — CBG MONITORING, ED: Glucose-Capillary: 129 mg/dL — ABNORMAL HIGH (ref 70–99)

## 2023-11-23 LAB — I-STAT CHEM 8, ED
BUN: 25 mg/dL — ABNORMAL HIGH (ref 8–23)
Calcium, Ion: 1.24 mmol/L (ref 1.15–1.40)
Chloride: 104 mmol/L (ref 98–111)
Creatinine, Ser: 1.9 mg/dL — ABNORMAL HIGH (ref 0.61–1.24)
Glucose, Bld: 120 mg/dL — ABNORMAL HIGH (ref 70–99)
HCT: 39 % (ref 39.0–52.0)
Hemoglobin: 13.3 g/dL (ref 13.0–17.0)
Potassium: 4.4 mmol/L (ref 3.5–5.1)
Sodium: 140 mmol/L (ref 135–145)
TCO2: 26 mmol/L (ref 22–32)

## 2023-11-23 LAB — DIFFERENTIAL
Abs Immature Granulocytes: 0.02 K/uL (ref 0.00–0.07)
Basophils Absolute: 0.1 K/uL (ref 0.0–0.1)
Basophils Relative: 1 %
Eosinophils Absolute: 0.1 K/uL (ref 0.0–0.5)
Eosinophils Relative: 1 %
Immature Granulocytes: 0 %
Lymphocytes Relative: 20 %
Lymphs Abs: 1.2 K/uL (ref 0.7–4.0)
Monocytes Absolute: 0.6 K/uL (ref 0.1–1.0)
Monocytes Relative: 10 %
Neutro Abs: 4 K/uL (ref 1.7–7.7)
Neutrophils Relative %: 68 %

## 2023-11-23 LAB — MRSA NEXT GEN BY PCR, NASAL: MRSA by PCR Next Gen: NOT DETECTED

## 2023-11-23 LAB — PROTIME-INR
INR: 1 (ref 0.8–1.2)
Prothrombin Time: 14.2 s (ref 11.4–15.2)

## 2023-11-23 LAB — APTT: aPTT: 31 s (ref 24–36)

## 2023-11-23 LAB — RAPID URINE DRUG SCREEN, HOSP PERFORMED
Amphetamines: NOT DETECTED
Barbiturates: NOT DETECTED
Benzodiazepines: POSITIVE — AB
Cocaine: NOT DETECTED
Opiates: NOT DETECTED
Tetrahydrocannabinol: NOT DETECTED

## 2023-11-23 LAB — ETHANOL: Alcohol, Ethyl (B): <15 mg/dL (ref <15)

## 2023-11-23 LAB — HEMOGLOBIN A1C
Hgb A1c MFr Bld: 5.6 % (ref 4.8–5.6)
Mean Plasma Glucose: 114.02 mg/dL

## 2023-11-23 MED ORDER — CHLORHEXIDINE GLUCONATE CLOTH 2 % EX PADS: 6.0000 | MEDICATED_PAD | Freq: Every day | CUTANEOUS | Status: DC

## 2023-11-23 MED ORDER — SENNOSIDES-DOCUSATE SODIUM 8.6-50 MG PO TABS: 1.0000 | ORAL_TABLET | Freq: Every evening | ORAL | Status: DC | PRN

## 2023-11-23 MED ORDER — ICATIBANT ACETATE 30 MG/3ML ~~LOC~~ SOSY: 30.0000 mg | PREFILLED_SYRINGE | Freq: Once | SUBCUTANEOUS | Status: DC | PRN

## 2023-11-23 MED ORDER — IOHEXOL 350 MG/ML SOLN
75.0000 mL | Freq: Once | INTRAVENOUS | Status: AC | PRN
  Administered 2023-11-23: 75 mL via INTRAVENOUS

## 2023-11-23 MED ORDER — ICATIBANT ACETATE 30 MG/3ML ~~LOC~~ SOSY
30.0000 mg | PREFILLED_SYRINGE | Freq: Once | SUBCUTANEOUS | Status: AC | PRN
  Administered 2023-11-26: 30 mg via SUBCUTANEOUS

## 2023-11-23 MED ORDER — CLEVIDIPINE BUTYRATE 0.5 MG/ML IV EMUL: 0.0000 mg/h | INTRAVENOUS | Status: DC

## 2023-11-23 MED ORDER — CHLORHEXIDINE GLUCONATE CLOTH 2 % EX PADS
6.0000 | MEDICATED_PAD | Freq: Every day | CUTANEOUS | Status: DC
  Administered 2023-11-23 – 2023-11-24 (×2): 6 via TOPICAL

## 2023-11-23 MED ORDER — PANTOPRAZOLE SODIUM 40 MG IV SOLR
40.0000 mg | Freq: Every day | INTRAVENOUS | Status: DC
  Administered 2023-11-23: 40 mg via INTRAVENOUS
  Filled 2023-11-23: qty 10

## 2023-11-23 MED ORDER — STROKE: EARLY STAGES OF RECOVERY BOOK
Freq: Once | Status: AC
  Filled 2023-11-23: qty 1

## 2023-11-23 MED ORDER — LABETALOL HCL 5 MG/ML IV SOLN: 10.0000 mg | INTRAVENOUS | Status: DC | PRN

## 2023-11-23 MED ORDER — SODIUM CHLORIDE 0.9 % IV SOLN: INTRAVENOUS | Status: AC

## 2023-11-23 MED ORDER — HYDRALAZINE HCL 20 MG/ML IJ SOLN: 10.0000 mg | INTRAMUSCULAR | Status: DC | PRN

## 2023-11-23 MED ORDER — TENECTEPLASE FOR STROKE
0.2500 mg/kg | PACK | Freq: Once | INTRAVENOUS | Status: AC
  Administered 2023-11-23: 22 mg via INTRAVENOUS
  Filled 2023-11-23: qty 10

## 2023-11-23 MED ORDER — MUPIROCIN 2 % EX OINT: 1.0000 | TOPICAL_OINTMENT | Freq: Two times a day (BID) | CUTANEOUS | Status: DC

## 2023-11-23 NOTE — Progress Notes (Signed)
 Given patient history, neurology plan to have patient's icatibant  on hand for possible hereditary angioedema episode considering received TNK for stroke.  1 box of patient's own icatibant  taken to [facility] with plan for it to be stored in 4N in patient-specific bin in refrigerator.   Dorn Buttner, PharmD, BCPS 11/23/2023 7:20 PM ED Clinical Pharmacist -  [phone]

## 2023-11-23 NOTE — ED Provider Triage Note (Signed)
 Emergency Medicine Provider Triage Evaluation Note  Daniel Herrera , a 83 y.o. male  was evaluated in triage.  Pt complains of possible stroke, last known well 5 PM.  Some new lack of coordination on the left, difficulty moving left arm, left leg as directed.  Left-sided numbness.  History of mini strokes.  Review of Systems  Positive: Numbness, weakness, dysmetria Negative: Confusion, dysarthria  Physical Exam  BP (!) 157/76   Pulse 71   Temp 98.4 F (36.9 C)   Resp 19   SpO2 100%  Gen:   Awake, no distress   Resp:  Normal effort  MSK:   Moves extremities without difficulty  Other:  Strength does appear to be intact bilaterally, no obvious facial droop, but he does have some obvious dysmetria with left arm, and reports numbness of left side  Medical Decision Making  Medically screening exam initiated at 6:15 PM.  Appropriate orders placed.  Daniel Herrera was informed that the remainder of the evaluation will be completed by another provider, this initial triage assessment does not replace that evaluation, and the importance of remaining in the ED until their evaluation is complete.  Workup initiated in triage    Daniel Herrera, [place] 11/23/23 1815

## 2023-11-23 NOTE — Code Documentation (Signed)
 Stroke Response Nurse Documentation Code Documentation  Daniel Herrera is a 83 y.o. male arriving to [facility]  via [employer] on 11/23/2023 with past medical hx of hereditary angioedema type II, HTN, HLD, CAD s/p CABG, MI, prior stroke, CHF, CKD, GERD, hx of GI Bleed, hx of prostate cancer, hypothyroidism, DM. On aspirin  81 mg daily. Code stroke was activated by ED.   Patient from home where he was LKW at 1515 and now complaining of aphasia and left sided weakness. Per patient, he stated he last at a sandwhich around 1500, then was sitting in the chair and was unable to get up to go to the bathroom and complained of left leg numbness and inability to use it. He experienced a fall while attempting to use it.   Stroke team at the bedside on patient arrival. Labs drawn and patient cleared for CT by Dr. Cleotis. Patient to CT with team. NIHSS 9, see documentation for details and code stroke times. Patient with left facial droop, left arm weakness, left leg weakness, left limb ataxia, left decreased sensation, and dysarthria  on exam. The following imaging was completed:  CT Head and CTA. Patient is not a candidate for IV Thrombolytic per MD. Patient is not a candidate for IR due to no LVO noted on imaging per MD.   Care Plan: VS/NIHSS q46min x 2hrs, q29min x6hrs, then q1hr; BP Goal <180/105.   Delay Process Noted: Discussion with patient and family.  Bedside handoff with ED RN Lorenza.    Annabella DELENA Bame  Stroke Response RN

## 2023-11-23 NOTE — ED Notes (Signed)
 Pt came through front door with complaints of left sided weakness and tingling, charge RN aware and pt brought to triage for evaluation

## 2023-11-23 NOTE — ED Triage Notes (Signed)
 Lt facial numbness he does not feel like his lt leg is working properly he can lift it but has difficulty placing it back where it was  speech clear  no pain anywhere

## 2023-11-23 NOTE — ED Provider Notes (Signed)
 [place] EMERGENCY DEPARTMENT AT [facility] Provider Note   CSN: [id number] Arrival date & time: 11/23/23  8195     Patient presents with: lt sided weakness   Daniel Herrera is a 83 y.o. male.   The history is provided by the patient and medical records. No language interpreter was used.  Neurologic Problem This is a new problem. The current episode started 3 to 5 hours ago. The problem occurs constantly. Pertinent negatives include no chest pain, no abdominal pain, no headaches and no shortness of breath. Nothing aggravates the symptoms. Nothing relieves the symptoms. He has tried nothing for the symptoms. The treatment provided no relief.       Prior to Admission medications   Medication Sig Start Date End Date Taking? Authorizing Provider  amLODipine  (NORVASC ) 2.5 MG tablet TAKE 1 TABLET BY MOUTH EVERY DAY 04/05/23   Revankar, Jennifer SAUNDERS, MD  aspirin  81 MG EC tablet Take 1 tablet (81 mg total) by mouth daily. 07/07/21   Ines Onetha NOVAK, MD  Azelastine  HCl 137 MCG/SPRAY SOLN Place 1 spray into both nostrils 2 (two) times daily. 02/05/21   [provider]  betamethasone  valerate ointment (VALISONE ) 0.1 % Apply 1 application topically 2 (two) times daily as needed for rash. 01/18/21   [provider]  busPIRone (BUSPAR) 5 MG tablet Take 5 mg by mouth 3 (three) times daily as needed. 07/08/23   [provider]  Cyanocobalamin  (VITAMIN B-12) 5000 MCG TBDP Take 5,000 mcg by mouth daily.     [provider]  dextromethorphan -guaiFENesin  (MUCINEX  DM) 30-600 MG 12hr tablet Take 1 tablet by mouth in the morning and at bedtime.    [provider]  fluoruracil (CARAC ) 0.5 % cream Apply to skin cancer for 1-7 days during 'flare up' as directed. 03/09/22   Kozlow, Camellia PARAS, MD  fluticasone  (FLONASE ) 50 MCG/ACT nasal spray Place 1 spray into both nostrils 2 (two) times daily.    [provider]  levothyroxine  (SYNTHROID ) 100 MCG tablet  Take 100 mcg by mouth daily before breakfast.    [provider]  losartan  (COZAAR ) 25 MG tablet Take 1 tablet (25 mg total) by mouth daily. 06/25/23   Revankar, Rajan R, MD  metoprolol  succinate (TOPROL -XL) 25 MG 24 hr tablet TAKE 1 TABLET BY MOUTH TWICE A DAY 10/08/23   Revankar, Jennifer SAUNDERS, MD  Multiple Vitamins-Minerals (CENTRUM PO) Take by mouth.    [provider]  nabumetone  (RELAFEN ) 750 MG tablet Take 750 mg by mouth 2 (two) times daily. 08/09/19   [provider]  nitroGLYCERIN  (NITROSTAT ) 0.4 MG SL tablet Place 0.4 mg under the tongue every 5 (five) minutes as needed for chest pain.    [provider]  OVER THE COUNTER MEDICATION Take 1 capsule by mouth in the morning and at bedtime. Health Plus/Super Colon Cleanse    [provider]  pantoprazole  (PROTONIX ) 40 MG tablet Take 40 mg by mouth 2 (two) times daily.    [provider]  Pitavastatin  Calcium  (LIVALO ) 4 MG TABS Take 4 mg by mouth every evening.    [provider]  Probiotic Product (PROBIOTIC DAILY PO) Take 1 capsule by mouth daily.    [provider]  promethazine-dextromethorphan  (PROMETHAZINE-DM) 6.25-15 MG/5ML syrup TAKE 5-10 MLS ( 1-2 TEASPOONFULS) EVERY FOUR-SIX HOURS AS NEEDED FOR COUGH 04/25/23   [provider]  TAKHZYRO  300 MG/2ML SOSY INJECT 300MG  SUBCUTANEOUSLY  EVERY 2 WEEKS 07/19/23   Kozlow, Camellia PARAS, MD  temazepam  (RESTORIL ) 15 MG capsule Take 15 mg by mouth at bedtime. 06/01/22   [provider]    Allergies: Lisinopril , Amlodipine , Oxycodone , Tape, Benadryl [diphenhydramine hcl], Darvocet [propoxyphene n-acetaminophen ], Darvon [propoxyphene hcl], and Tylenol  [acetaminophen ]    Review of Systems  Constitutional:  Negative for chills, fatigue and fever.  HENT:  Negative for congestion.   Eyes:  Negative for pain and visual disturbance.  Respiratory:  Negative for cough, chest tightness and shortness of breath.   Cardiovascular:   Negative for chest pain and palpitations.  Gastrointestinal:  Negative for abdominal pain, constipation, diarrhea, nausea and vomiting.  Genitourinary:  Negative for dysuria.  Musculoskeletal:  Negative for back pain, neck pain and neck stiffness.  Skin:  Negative for rash and wound.  Neurological:  Positive for facial asymmetry and weakness. Negative for speech difficulty, light-headedness, numbness and headaches.  Psychiatric/Behavioral:  Negative for agitation and confusion.   All other systems reviewed and are negative.   Updated Vital Signs BP (!) 157/76   Pulse 71   Temp 98.4 F (36.9 C)   Resp 19   Ht 6' 1 (1.854 m)   Wt 87.8 kg   SpO2 100%   BMI 25.54 kg/m   Physical Exam Vitals and nursing note reviewed.  Constitutional:      General: He is not in acute distress.    Appearance: He is well-developed. He is not toxic-appearing or diaphoretic.  HENT:     Head: Normocephalic and atraumatic.     Nose: Nose normal.     Mouth/Throat:     Mouth: Mucous membranes are moist.     Pharynx: No oropharyngeal exudate or posterior oropharyngeal erythema.  Eyes:     Extraocular Movements: Extraocular movements intact.     Conjunctiva/sclera: Conjunctivae normal.     Pupils: Pupils are equal, round, and reactive to light.  Neck:     Vascular: No carotid bruit.  Cardiovascular:     Rate and Rhythm: Normal rate and regular rhythm.     Heart sounds: No murmur heard. Pulmonary:     Effort: Pulmonary effort is normal. No respiratory distress.     Breath sounds: Normal breath sounds. No wheezing, rhonchi or rales.  Chest:     Chest wall: No tenderness.  Abdominal:     General: Abdomen is flat.     Palpations: Abdomen is soft.     Tenderness: There is no abdominal tenderness. There is no right CVA tenderness, left CVA tenderness, guarding or rebound.  Musculoskeletal:        General: No swelling or tenderness.     Cervical back: Neck supple. No tenderness.  Skin:    General:  Skin is warm and dry.     Capillary Refill: Capillary refill takes less than 2 seconds.     Findings: No erythema or rash.  Neurological:     Mental Status: He is alert.     Cranial Nerves: Facial asymmetry present.     Sensory: No sensory deficit.     Motor: Weakness and abnormal muscle tone present.     Comments: Left face, left arm and left leg weakness and coordination difficulty with arm and leg.  Psychiatric:        Mood and Affect: Mood normal.     (all labs ordered are listed, but only abnormal results are displayed) Labs Reviewed  CBC - Abnormal; Notable for the following components:      Result Value   RBC 4.19 (*)  All other components within normal limits  CBG MONITORING, ED - Abnormal; Notable for the following components:   Glucose-Capillary 129 (*)    All other components within normal limits  I-STAT CHEM 8, ED - Abnormal; Notable for the following components:   BUN 25 (*)    Creatinine, Ser 1.90 (*)    Glucose, Bld 120 (*)    All other components within normal limits  PROTIME-INR  APTT  DIFFERENTIAL  ETHANOL  COMPREHENSIVE METABOLIC PANEL WITH GFR  RAPID URINE DRUG SCREEN, HOSP PERFORMED  LIPID PANEL  HEMOGLOBIN A1C  CBC  BASIC METABOLIC PANEL WITH GFR  MAGNESIUM   PHOSPHORUS    EKG: None  Radiology: CT HEAD CODE STROKE WO CONTRAST Result Date: 11/23/2023 CLINICAL DATA:  Code stroke. Neuro deficit, acute, stroke suspected. EXAM: CT HEAD WITHOUT CONTRAST TECHNIQUE: Contiguous axial images were obtained from the base of the skull through the vertex without intravenous contrast. RADIATION DOSE REDUCTION: This exam was performed according to the departmental dose-optimization program which includes automated exposure control, adjustment of the mA and/or kV according to patient size and/or use of iterative reconstruction technique. COMPARISON:  Head CT 09/03/2022. FINDINGS: Brain: Generalized cerebral atrophy. Patchy and ill-defined hypoattenuation within  the cerebral white matter, nonspecific but compatible with mild chronic small vessel ischemic disease. Redemonstrated small chronic infarcts within the left cerebellar hemisphere. There is no acute intracranial hemorrhage. No demarcated cortical infarct. No extra-axial fluid collection. No evidence of an intracranial mass. No midline shift. Vascular: No hyperdense vessel.  Atherosclerotic calcifications. Skull: No calvarial fracture or aggressive osseous lesion. Sinuses/Orbits: No mass or acute finding within the imaged orbits. Severe mucosal thickening within the left maxillary sinus. ASPECTS [facility] Stroke Program Early CT Score) - Ganglionic level infarction (caudate, lentiform nuclei, internal capsule, insula, M1-M3 cortex): 7 - Supraganglionic infarction (M4-M6 cortex): 3 Total score (0-10 with 10 being normal): 10 No evidence of an acute intracranial abnormality. These results were communicated to Dr. Michaela at 6:48 pmon 8/22/2025by text page via the [facility] messaging system. IMPRESSION: 1.  No evidence of an acute intracranial abnormality. 2. Parenchymal atrophy and chronic small vessel ischemic disease. 3. Redemonstrated small chronic infarcts within the left cerebellar hemisphere. 4. Severe mucosal thickening within the left maxillary sinus. Electronically Signed   By: Rockey Childs D.O.   On: 11/23/2023 18:48     Procedures   CRITICAL CARE Performed by: Lonni PARAS Ianna Salmela Total critical care time: 30 minutes Critical care time was exclusive of separately billable procedures and treating other patients. Critical care was necessary to treat or prevent imminent or life-threatening deterioration. Critical care was time spent personally by me on the following activities: development of treatment plan with patient and/or surrogate as well as nursing, discussions with consultants, evaluation of patient's response to treatment, examination of patient, obtaining history from patient or surrogate,  ordering and performing treatments and interventions, ordering and review of laboratory studies, ordering and review of radiographic studies, pulse oximetry and re-evaluation of patient's condition.   Medications Ordered in the ED   stroke: early stages of recovery book (has no administration in time range)  0.9 %  sodium chloride  infusion (has no administration in time range)  senna-docusate (Senokot-S) tablet 1 tablet (has no administration in time range)  pantoprazole  (PROTONIX ) injection 40 mg (has no administration in time range)  labetalol  (NORMODYNE ) injection 10-20 mg (has no administration in time range)  tenecteplase  (TNKASE ) injection for Stroke 22 mg (22 mg Intravenous Given 11/23/23 1843)  iohexol  (OMNIPAQUE ) 350  MG/ML injection 75 mL (75 mLs Intravenous Contrast Given 11/23/23 1854)                                    Medical Decision Making Risk Decision regarding hospitalization.    Rehman C Majerus is a 83 y.o. male with a complex past medical history including previous stroke, CAD with previous MI, previous CABG, paroxysmal atrial fibrillation, previous prostate cancer, diabetes, hypertension, dyslipidemia, interstitial lung disease, and hereditary angioedema on multiple injection medications who presents as a code stroke.  According to patient, at 3:15 PM he started watching TV show when he got up he was having weakness on the left side.  Left arm left leg and left face have weakness and coordination problem.  The patient denies headache or neck pain.  Denies any chest pain short of breath or cough.  No other complaints.  No vision changes.  Patient activated as a code stroke in triage and when I saw the patient he was already getting seen by neurology in the CT scanner.  On my exam, patient indeed had weakness in the left arm left face and left leg.  Pupils were symmetric and reactive with normal extract movements.  He was able to speak.  No sensory deficits and there was  no neglect.  Patient CT did not show bleed and he was offered TNK by neurology.  We discussed the risk of TNK causing angioedema given his hereditary angioedema and patient has his medications that he takes including the icatibant  and takhzyro .   After TNK, patient symptoms have nearly resolved.  He is doing much better.  He will be admitted to the ICU for further monitoring and management given his angioedema risk.  Patient admitted for further management.          Final diagnoses:  Cerebrovascular accident (CVA), unspecified mechanism (HCC)    Clinical Impression: 1. Cerebrovascular accident (CVA), unspecified mechanism (HCC)     Disposition: Admit  This note was prepared with assistance of Dragon voice recognition software. Occasional wrong-word or sound-a-like substitutions may have occurred due to the inherent limitations of voice recognition software.       Kacyn Souder, Lonni PARAS, MD 11/23/23 [phone]

## 2023-11-23 NOTE — H&P (Addendum)
 NEUROLOGY H&P NOTE   Date of service: November 23, 2023 Patient Name: Daniel Herrera MRN:  [id number] DOB:  [birth year] Chief Complaint: Code stroke   History of Present Illness  Daniel Herrera is a 83 y.o. male with hx of history of hereditary angioedema type II, PAF on ASA, HTN, HLD, CAD s/p CABG, MI, prior stroke, CHF, CKD, GERD, hx of GI bleed, history of prostate cancer, hypothyroidism, diabetes who presents to [facility], ED via private vehicle for acute onset of left-sided weakness.  Last known well 1515.  Patient states he last had a sandwich around 3:00, then was sitting in the chair and was unable to get up to the chair to go to the bathroom and complained of left leg numbness and inability to use his left leg.  He did experience a fall due to left leg weakness.  Family drove him to the emergency room.  NIH 9 for left facial droop, left arm and leg weakness, ataxia in both left arm and leg, decreased sensation on the left, dysarthria.  CT head with no acute process.  CT angio head and neck negative for LVO  Patient was deemed an appropriate candidate for TNK and TNK was administered. Risks, benefits and alternatives of IVT discussed with patient and family (daughter) and they agreed.  CTH personally reviewed by Dr. Michaela prior to TNK administration  Patient will be admitted to 4 NICU  for further management Last known well: 1515 Modified rankin score: 0-Completely asymptomatic and back to baseline post- stroke IV Thrombolysis: Yes  Thrombectomy: No LVO   NIHSS components Score: Comment  1a Level of Conscious 0[]  1[]  2[]  3[]      1b LOC Questions 0[]  1[]  2[]       1c LOC Commands 0[]  1[]  2[]       2 Best Gaze 0[]  1[]  2[]       3 Visual 0[]  1[]  2[]  3[]      4 Facial Palsy 0[]  1[x]  2[]  3[]      5a Motor Arm - left 0[]  1[]  2[x]  3[]  4[]  UN[]    5b Motor Arm - Right 0[]  1[]  2[]  3[]  4[]  UN[]    6a Motor Leg - Left 0[]  1[]  2[x]  3[]  4[]  UN[]    6b Motor Leg - Right 0[]  1[]  2[]  3[]  4[]   UN[]    7 Limb Ataxia 0[]  1[]  2[x]  UN[]      8 Sensory 0[]  1[x]  2[]  UN[]      9 Best Language 0[]  1[]  2[]  3[]      10 Dysarthria 0[]  1[x]  2[]  UN[]      11 Extinct. and Inattention 0[]  1[]  2[]       TOTAL: 9    Post TNK NIHSS components Score: Comment  1a Level of Conscious 0[]  1[]  2[]  3[]      1b LOC Questions 0[]  1[]  2[]       1c LOC Commands 0[]  1[]  2[]       2 Best Gaze 0[]  1[]  2[]       3 Visual 0[]  1[]  2[]  3[]      4 Facial Palsy 0[]  1[]  2[]  3[]      5a Motor Arm - left 0[]  1[]  2[]  3[]  4[]  UN[]    5b Motor Arm - Right 0[]  1[]  2[]  3[]  4[]  UN[]    6a Motor Leg - Left 0[]  1[]  2[]  3[]  4[]  UN[]    6b Motor Leg - Right 0[]  1[]  2[]  3[]  4[]  UN[]    7 Limb Ataxia 0[]  1[]  2[x]  UN[]      8 Sensory 0[]  1[x]  2[]  UN[]   9 Best Language 0[]  1[]  2[]  3[]      10 Dysarthria 0[]  1[]  2[]  UN[]      11 Extinct. and Inattention 0[]  1[]  2[]       TOTAL: 3    ROS  Comprehensive ROS performed and pertinent positives documented in the HPI   Past History   Past Medical History:  Diagnosis Date   Abnormal EKG 02/21/2015   Acute lower GI bleeding 05/28/2019   Anginal pain (HCC) 2016   Angio-edema    Anxiety    Arthritis    CAD S/P percutaneous coronary angioplasty    NSTEMI treated with LAD PCI with DES x 03 Feb 2015 Myoview low risk Nov 2017   Cataract    Cerebellar stroke (HCC) 06/14/2021   CHF (congestive heart failure) (HCC) 2020   after surgery only   Chronic diarrhea of unknown origin    CKD (chronic kidney disease) stage 3, GFR 30-59 ml/min 12/21/2017   Colitis 05/23/2015   Coronary artery disease 05/28/2019   Diverticular hemorrhage    Diverticulosis    Dyslipidemia, goal LDL below 70 02/21/2015   hyperlipidemia    Dysrhythmia    A-fib   Enterotoxigenic Escherichia coli infection 05/24/2015   Essential hypertension 02/21/2015   Geographic tongue    GERD (gastroesophageal reflux disease)    GERD without esophagitis 06/15/2021   GI bleed 12/21/2017   Glossitis, benign migratory 12/04/2018    Gout    Hematochezia 12/21/2017   Admitted Sept 2019 with lower GI bleeding felt to be secondary to hemorrhoids   Hereditary angioedema type 2 (HCC) 07/28/2017   High cholesterol    History of kidney stones    History of non-ST elevation myocardial infarction (NSTEMI) 02/22/2015   Nov 2016   History of prolonged Q-T interval on ECG    History of prostate cancer    Hoarseness 12/04/2018   Hx of Clostridium difficile infection    Hx of umbilical hernia repair    Hypertension    Hypothyroidism    Hypothyroidism, unspecified 06/15/2021   ILD (interstitial lung disease) (HCC) 12/17/2013   ?related to chicken farm exposure or aspiration pneumonitis from GERD Previously attributed to acute interstitial pneumonitis but based on HRCT this is likely IPF, probable UIP   Lower GI bleed 06/03/2019   Malignant neoplasm of prostate (HCC) 08/07/2014   Myocardial infarction [facility]) 2016   NSTEMI (non-ST elevated myocardial infarction) (HCC) 05/21/2018   Open metatarsal fracture 09/02/2020   PAF (paroxysmal atrial fibrillation) (HCC) 06/13/2018   Post CABG- discharged on Amiodarone , he will probably not need this long term   Palpitations 03/15/2015   palpitations    Peripheral vascular disease (HCC)    vericose veins   Personal history of digestive disease    gastric ulcer   Pre-diabetes    Pre-operative clearance 01/02/2018   Prostate cancer (HCC)    Prostate enlargement    S/P CABG x 3 05/28/2018    LIMA-LAD, RIMA-RCA, and Lt radial to OM.   Skin cancer    basal cell carcinoma   STEC (Shiga toxin-producing Escherichia coli) infection 05/24/2015   Stroke (cerebrum) (HCC) 06/15/2021   Tachycardia determined by examination of pulse    Thyroid  disease    Type 2 diabetes mellitus with unspecified complications (HCC)    Type 2 diabetes mellitus without complications (HCC) 06/15/2021   Urticaria    Past Surgical History:  Procedure Laterality Date   APPENDECTOMY     BACK SURGERY      CARDIAC CATHETERIZATION N/A  02/21/2015   Procedure: Left Heart Cath;  Surgeon: Dorn JINNY Lesches, MD;  Location: [facility] INVASIVE CV LAB;  Service: Cardiovascular;  Laterality: N/A;   CATARACT EXTRACTION, BILATERAL     CORONARY ARTERY BYPASS GRAFT N/A 05/24/2018   Procedure: CORONARY ARTERY BYPASS GRAFTING (CABG), ON PUMP, TIMES THREE, USING BILATERAL INTERNAL MAMMARY ARTERY AND HARVESTED LEFT RADIAL ARTERY;  Surgeon: Lucas Dorise POUR, MD;  Location: [facility] OR;  Service: Open Heart Surgery;  Laterality: N/A;   CYSTOSCOPY WITH LITHOLAPAXY N/A 07/26/2020   Procedure: CYSTOSCOPY WITH LITHOLAPAXY WITH FULGERATION;  Surgeon: Renda Glance, MD;  Location: [facility] ORS;  Service: Urology;  Laterality: N/A;   HOLMIUM LASER APPLICATION N/A 07/26/2020   Procedure: HOLMIUM LASER APPLICATION;  Surgeon: Renda Glance, MD;  Location: [facility] ORS;  Service: Urology;  Laterality: N/A;   I & D EXTREMITY Right 09/02/2020   Procedure: IRRIGATION AND DEBRIDEMENT RIGHT 2ND AND 3RD METATARSAL;  Surgeon: Kit Rush, MD;  Location: [facility] OR;  Service: Orthopedics;  Laterality: Right;   LEFT HEART CATH AND CORONARY ANGIOGRAPHY N/A 05/22/2018   Procedure: LEFT HEART CATH AND CORONARY ANGIOGRAPHY;  Surgeon: Dann Candyce RAMAN, MD;  Location: [facility] INVASIVE CV LAB;  Service: Cardiovascular;  Laterality: N/A;   NASAL SINUS SURGERY     ORIF TOE FRACTURE Right 09/02/2020   Procedure: OPEN REDUCTION INTERNAL FIXATION (ORIF) OF RIGHT OPEN 2ND AND 3RD METATARSAL (TOE) OPEN FRACTURE;  Surgeon: Kit Rush, MD;  Location: [facility] OR;  Service: Orthopedics;  Laterality: Right;   PROSTATE BIOPSY     RADIAL ARTERY HARVEST Left 05/24/2018   Procedure: RADIAL ARTERY HARVEST;  Surgeon: Lucas Dorise POUR, MD;  Location: [facility] OR;  Service: Open Heart Surgery;  Laterality: Left;   right shoulder rotator cuff repair     TEE WITHOUT CARDIOVERSION N/A 05/24/2018   Procedure: TRANSESOPHAGEAL ECHOCARDIOGRAM (TEE);  Surgeon: Lucas Dorise POUR, MD;  Location: [facility] OR;  Service: Open Heart  Surgery;  Laterality: N/A;   Family History  Problem Relation Age of Onset   Cancer Mother    Stomach cancer Mother    Cancer Father        prostate   Cancer Sister    Sleep apnea Neg Hx    Social History   Socioeconomic History   Marital status: Married    Spouse name: Not on file   Number of children: Not on file   Years of education: Not on file   Highest education level: Not on file  Occupational History   Occupation: retired  Tobacco Use   Smoking status: Never   Smokeless tobacco: Never  Vaping Use   Vaping status: Never Used  Substance and Sexual Activity   Alcohol  use: Never   Drug use: No   Sexual activity: Not on file  Other Topics Concern   Not on file  Social History Narrative   Not on file   Social Drivers of Health   Financial Resource Strain: Not on file  Food Insecurity: No Food Insecurity (06/02/2019)   Hunger Vital Sign    Worried About Running Out of Food in the Last Year: Never true    Ran Out of Food in the Last Year: Never true  Transportation Needs: No Transportation Needs (06/02/2019)   PRAPARE - Administrator, Civil Service (Medical): No    Lack of Transportation (Non-Medical): No  Physical Activity: Inactive (09/30/2018)   Received from [facility]   Exercise Vital Sign    On average, how many days  per week do you engage in moderate to strenuous exercise (like a brisk walk)?: 0 days    On average, how many minutes do you engage in exercise at this level?: 0 min  Stress: No Stress Concern Present (09/30/2018)   Received from [facility] - Occupational Stress Questionnaire    Feeling of Stress : Not at all  Social Connections: Socially Integrated (09/30/2018)   Received from [facility]   Social Connection and Isolation Panel    In a typical week, how many times do you talk on the phone with family, friends, or neighbors?: More than three times a week    How often do you get  together with friends or relatives?: More than three times a week    How often do you attend church or religious services?: More than 4 times per year    Do you belong to any clubs or organizations such as church groups, unions, fraternal or athletic groups, or school groups?: Yes    How often do you attend meetings of the clubs or organizations you belong to?: 1 to 4 times per year    Are you married, widowed, divorced, separated, never married, or living with a partner?: Married   Allergies  Allergen Reactions   Lisinopril  Anaphylaxis, Swelling and Other (See Comments)    Face, lips, and throat swell   Amlodipine  Other (See Comments)    Heart pounding, head splitting, ankles broke out    Oxycodone  Other (See Comments)    Hallucination   Tape Other (See Comments)    PLASTIC TAPE TEARS OFF THE SKIN- Please use an alternative!!   Benadryl [Diphenhydramine Hcl] Swelling and Other (See Comments)    Facial swelling from high dose (tolerates Advil  PM as needed)   Darvocet [Propoxyphene N-Acetaminophen ] Other (See Comments)    Hallucinations    Darvon [Propoxyphene Hcl] Other (See Comments)    Hallucination    Tylenol  [Acetaminophen ] Swelling and Other (See Comments)    Foot swelling    Medications   Current Facility-Administered Medications:    [START ON 11/24/2023]  stroke: early stages of recovery book, , Does not apply, Once, Waddell Aquas A, NP   0.9 %  sodium chloride  infusion, , Intravenous, Continuous, Wolfe, Denise A, NP   labetalol  (NORMODYNE ) injection 10-20 mg, 10-20 mg, Intravenous, Q2H PRN, Waddell Aquas A, NP   pantoprazole  (PROTONIX ) injection 40 mg, 40 mg, Intravenous, QHS, Wolfe, Denise A, NP   senna-docusate (Senokot-S) tablet 1 tablet, 1 tablet, Oral, QHS PRN, Waddell Aquas LABOR, NP  Current Outpatient Medications:    amLODipine  (NORVASC ) 2.5 MG tablet, TAKE 1 TABLET BY MOUTH EVERY DAY, Disp: 90 tablet, Rfl: 2   aspirin  81 MG EC tablet, Take 1 tablet (81 mg total) by  mouth daily., Disp: 30 tablet, Rfl: 11   Azelastine  HCl 137 MCG/SPRAY SOLN, Place 1 spray into both nostrils 2 (two) times daily., Disp: , Rfl:    betamethasone  valerate ointment (VALISONE ) 0.1 %, Apply 1 application topically 2 (two) times daily as needed for rash., Disp: , Rfl:    busPIRone (BUSPAR) 5 MG tablet, Take 5 mg by mouth 3 (three) times daily as needed., Disp: , Rfl:    Cyanocobalamin  (VITAMIN B-12) 5000 MCG TBDP, Take 5,000 mcg by mouth daily. , Disp: , Rfl:    dextromethorphan -guaiFENesin  (MUCINEX  DM) 30-600 MG 12hr tablet, Take 1 tablet by mouth in the morning and at bedtime., Disp: , Rfl:  fluoruracil (CARAC ) 0.5 % cream, Apply to skin cancer for 1-7 days during 'flare up' as directed., Disp: 30 g, Rfl: 3   fluticasone  (FLONASE ) 50 MCG/ACT nasal spray, Place 1 spray into both nostrils 2 (two) times daily., Disp: , Rfl:    levothyroxine  (SYNTHROID ) 100 MCG tablet, Take 100 mcg by mouth daily before breakfast., Disp: , Rfl:    losartan  (COZAAR ) 25 MG tablet, Take 1 tablet (25 mg total) by mouth daily., Disp: 90 tablet, Rfl: 1   metoprolol  succinate (TOPROL -XL) 25 MG 24 hr tablet, TAKE 1 TABLET BY MOUTH TWICE A DAY, Disp: 180 tablet, Rfl: 0   Multiple Vitamins-Minerals (CENTRUM PO), Take by mouth., Disp: , Rfl:    nabumetone  (RELAFEN ) 750 MG tablet, Take 750 mg by mouth 2 (two) times daily., Disp: , Rfl:    nitroGLYCERIN  (NITROSTAT ) 0.4 MG SL tablet, Place 0.4 mg under the tongue every 5 (five) minutes as needed for chest pain., Disp: , Rfl:    OVER THE COUNTER MEDICATION, Take 1 capsule by mouth in the morning and at bedtime. Health Plus/Super Colon Cleanse, Disp: , Rfl:    pantoprazole  (PROTONIX ) 40 MG tablet, Take 40 mg by mouth 2 (two) times daily., Disp: , Rfl:    Pitavastatin  Calcium  (LIVALO ) 4 MG TABS, Take 4 mg by mouth every evening., Disp: , Rfl:    Probiotic Product (PROBIOTIC DAILY PO), Take 1 capsule by mouth daily., Disp: , Rfl:    promethazine-dextromethorphan   (PROMETHAZINE-DM) 6.25-15 MG/5ML syrup, TAKE 5-10 MLS ( 1-2 TEASPOONFULS) EVERY FOUR-SIX HOURS AS NEEDED FOR COUGH, Disp: , Rfl:    TAKHZYRO  300 MG/2ML SOSY, INJECT 300MG  SUBCUTANEOUSLY  EVERY 2 WEEKS, Disp: 4 mL, Rfl: 11   temazepam  (RESTORIL ) 15 MG capsule, Take 15 mg by mouth at bedtime., Disp: , Rfl:    Vitals   Vitals:   11/23/23 1807 11/23/23 1813 11/23/23 1822 11/23/23 1837  BP: (!) 157/76 (!) 157/76    Pulse: 71 71    Resp: 16 19    Temp:  98.4 F (36.9 C)    TempSrc: Oral     SpO2: 99% 100%    Weight:   87.8 kg 87.8 kg  Height:   6' 1 (1.854 m)      Body mass index is 25.54 kg/m.  Physical Exam   Constitutional: Appears well-developed and well-nourished.  Psych: Affect appropriate to situation.  Eyes: No scleral injection.  HENT: No OP obstruction.  Head: Normocephalic.  Cardiovascular: Normal rate and regular rhythm.  Respiratory: Effort normal, non-labored breathing.  GI: Soft.  No distension. There is no tenderness.  Skin: WDI.   Neurologic Examination   Mental Status -  Level of arousal and orientation to time, place, and person were intact. Language including expression, naming, repetition, comprehension was assessed and found intact. Attention span and concentration were normal. Recent and remote memory were intact. Fund of Knowledge was assessed and was intact.  Cranial Nerves II - XII - II - Visual field intact OU. III, IV, VI - Extraocular movements intact. V -decreased on left VII -left facial droop VIII - Hearing & vestibular intact bilaterally. X - Palate elevates symmetrically. XI - Chin turning & shoulder shrug intact bilaterally. XII - Tongue protrusion intact.  Motor Strength - The patient's strength was normal in all right arm and right leg, left arm and left leg 4/5 with drift in arm and leg   Bulk was normal and fasciculations were absent.   Motor Tone - Muscle tone was  assessed at the neck and appendages and was normal. Sensory  -decreased on left Coordination -ataxia in both arm and leg on left side out of proportion to weakness Gait and Station - deferred.  Labs   CBC:  Recent Labs  Lab 11/23/23 1814 11/23/23 1815  WBC 5.8  --   NEUTROABS 4.0  --   HGB 13.4 13.3  HCT 39.7 39.0  MCV 94.7  --   PLT 182  --    Basic Metabolic Panel:  Lab Results  Component Value Date   NA 140 11/23/2023   K 4.4 11/23/2023   CO2 23 01/08/2023   GLUCOSE 120 (H) 11/23/2023   BUN 25 (H) 11/23/2023   CREATININE 1.90 (H) 11/23/2023   CALCIUM  9.1 01/08/2023   GFRNONAA 36 (L) 01/08/2023   GFRAA 51 (L) 06/06/2019   Lipid Panel:  Lab Results  Component Value Date   LDLCALC 90 06/15/2021   HgbA1c:  Lab Results  Component Value Date   HGBA1C 5.8 (H) 06/15/2021   Urine Drug Screen:     Component Value Date/Time   LABOPIA NONE DETECTED 06/14/2021 2002   COCAINSCRNUR NONE DETECTED 06/14/2021 2002   LABBENZ NONE DETECTED 06/14/2021 2002   AMPHETMU NONE DETECTED 06/14/2021 2002   THCU NONE DETECTED 06/14/2021 2002   LABBARB NONE DETECTED 06/14/2021 2002    Alcohol  Level No results found for: [facility] INR  Lab Results  Component Value Date   INR 1.0 11/23/2023   APTT  Lab Results  Component Value Date   APTT 31 11/23/2023     CT Head without contrast(Personally reviewed): No acute process  CT angio Head and Neck with contrast(Personally reviewed): No LVO   Assessment   Daniel Herrera is a 83 y.o. male hx of history of hereditary angioedema type II, HTN, HLD, CAD s/p CABG, MI, prior stroke, CHF, CKD, GERD, hx of GI bleed, history of prostate cancer, hypothyroidism, diabetes who presents to [facility] ED via private vehicle for acute onset of left-sided weakness.  Last known well 1515.  Patient states he last had a sandwich around 3:00, then was sitting in the chair and was unable to get up to the chair to go to the bathroom and complained of left leg numbness and inability to use his left leg.  He did  experience a fall due to left leg weakness.  Family drove him to the emergency room.  NIH 9  Primary Diagnosis:  Acute right MCA ischemic infarct  Secondary Diagnosis: Essential (primary) hypertension, Heart failure, unspecified, Type 2 diabetes mellitus w/o complications, and CKD Stage 3 (GFR 30-59)  Recommendations  - admit to the Neuro ICU  - HgbA1c, fasting lipid panel - MRI of the brain without contrast - Frequent neuro checks - Echocardiogram - NO Antiplatelet meds for 24 hrs post TNK with negative hemorrhage on brain imaging - BP goal 180/105 for 24 hrs post TNK - Labetalol  and hydralazine   PRN to maintain BP - Bedside swallow screen. If passes may have a heart healthy diet  - CBC, BMP, MG, phos in am  - UDS, UA  - SCD's  - NS @ 40cc/hr  - Statin  - Risk factor modification - Telemetry monitoring - PT consult, OT consult, Speech consult - Stroke team to follow ______________________________________________________________________   Signed, Karna DELENA Geralds, NP [facility]  I have seen the patient and reviewed the above note.  He has a history of previous stroke and presents with acute onset left-sided weakness that  he noticed when getting out of a chair.  He had previously gone over the neighbors around 315, and so this is his last known well.  After excluding blood thinners, history of intracranial hemorrhage, recent GI bleeding or surgery, I discussed the risks, benefits, and alternatives of IV TNK with the patient.  With his history of hereditary angioedema, I do not know if he is at increased risk of angioedema from his thrombolytics, but I discussed this with the patient and after discussion with the patient and his daughter, we elected to proceed with IV TNK.  I did read the CT to treat prior to tenecteplase  administration and there was no signs of intracranial hemorrhage.  He will need to be admitted to the intensive care unit for post TNK monitoring. This  patient is critically ill and at significant risk of neurological worsening, death and care requires constant monitoring of vital signs, hemodynamics,respiratory and cardiac monitoring, neurological assessment, discussion with family, other specialists and medical decision making of high complexity. I spent 45 minutes of neurocritical care time  in the care of  this patient. This was time spent independent of any time provided by nurse practitioner or PA.  Aisha Seals, MD [facility]   If 7pm- 7am, please page neurology on call as listed in AMION. 11/23/2023  8:48 PM

## 2023-11-23 NOTE — ED Triage Notes (Signed)
 Lt sided weakness  around 1630-1700 speech clear  he reports that he has had a stroke in the past

## 2023-11-23 NOTE — Progress Notes (Signed)
 eLink Physician-Brief Progress Note Patient Name: Daniel Herrera DOB: 1940-04-22 MRN: [id number]   Date of Service  11/23/2023  HPI/Events of Note  Patient admitted as a Code Stroke and is s/p TNK. Neurology is following.  eICU Interventions  New Patient Evaluation.        [name] 11/23/2023, 8:55 PM

## 2023-11-23 NOTE — Progress Notes (Signed)
 PHARMACIST CODE STROKE RESPONSE  Notified to mix TNK at 1840 by Dr. Michaela TNK preparation completed at 1843  TNK dose = 22 mg IV over 5 seconds  Issues/delays encountered (if applicable): n/a  Daniel Herrera 11/23/23 7:15 PM

## 2023-11-23 NOTE — ED Notes (Signed)
 Called ICU to give report to receiving RN

## 2023-11-24 ENCOUNTER — Inpatient Hospital Stay (HOSPITAL_COMMUNITY)

## 2023-11-24 DIAGNOSIS — I6389 Other cerebral infarction: Secondary | ICD-10-CM | POA: Diagnosis not present

## 2023-11-24 LAB — BASIC METABOLIC PANEL WITH GFR
Anion gap: 9 (ref 5–15)
BUN: 19 mg/dL (ref 8–23)
CO2: 25 mmol/L (ref 22–32)
Calcium: 9.4 mg/dL (ref 8.9–10.3)
Chloride: 104 mmol/L (ref 98–111)
Creatinine, Ser: 1.8 mg/dL — ABNORMAL HIGH (ref 0.61–1.24)
GFR, Estimated: 36 mL/min — ABNORMAL LOW (ref >60)
Glucose, Bld: 123 mg/dL — ABNORMAL HIGH (ref 70–99)
Potassium: 4.3 mmol/L (ref 3.5–5.1)
Sodium: 138 mmol/L (ref 135–145)

## 2023-11-24 LAB — LIPID PANEL
Cholesterol: 168 mg/dL (ref 0–200)
HDL: 33 mg/dL — ABNORMAL LOW (ref >40)
LDL Cholesterol: 106 mg/dL — ABNORMAL HIGH (ref 0–99)
Total CHOL/HDL Ratio: 5.1 ratio
Triglycerides: 144 mg/dL (ref <150)
VLDL: 29 mg/dL (ref 0–40)

## 2023-11-24 LAB — CBC
HCT: 37.4 % — ABNORMAL LOW (ref 39.0–52.0)
Hemoglobin: 12.7 g/dL — ABNORMAL LOW (ref 13.0–17.0)
MCH: 31.7 pg (ref 26.0–34.0)
MCHC: 34 g/dL (ref 30.0–36.0)
MCV: 93.3 fL (ref 80.0–100.0)
Platelets: 170 K/uL (ref 150–400)
RBC: 4.01 MIL/uL — ABNORMAL LOW (ref 4.22–5.81)
RDW: 12.5 % (ref 11.5–15.5)
WBC: 5.8 K/uL (ref 4.0–10.5)
nRBC: 0 % (ref 0.0–0.2)

## 2023-11-24 LAB — ECHOCARDIOGRAM COMPLETE
Area-P 1/2: 3.51 cm2
Height: 73 in
S' Lateral: 3.3 cm
Weight: 3093.49 [oz_av]

## 2023-11-24 LAB — GLUCOSE, CAPILLARY
Glucose-Capillary: 107 mg/dL — ABNORMAL HIGH (ref 70–99)
Glucose-Capillary: 110 mg/dL — ABNORMAL HIGH (ref 70–99)
Glucose-Capillary: 121 mg/dL — ABNORMAL HIGH (ref 70–99)

## 2023-11-24 LAB — MAGNESIUM: Magnesium: 1.8 mg/dL (ref 1.7–2.4)

## 2023-11-24 LAB — PHOSPHORUS: Phosphorus: 4.3 mg/dL (ref 2.5–4.6)

## 2023-11-24 MED ORDER — BUSPIRONE HCL 10 MG PO TABS
5.0000 mg | ORAL_TABLET | Freq: Three times a day (TID) | ORAL | Status: DC | PRN
  Filled 2023-11-24: qty 1

## 2023-11-24 MED ORDER — ASPIRIN 81 MG PO TBEC: 81.0000 mg | DELAYED_RELEASE_TABLET | Freq: Every day | ORAL | Status: DC

## 2023-11-24 MED ORDER — ORAL CARE MOUTH RINSE
15.0000 mL | OROMUCOSAL | Status: AC | PRN
Start: 2023-11-24 — End: ?

## 2023-11-24 MED ORDER — LOSARTAN POTASSIUM 50 MG PO TABS
25.0000 mg | ORAL_TABLET | Freq: Every day | ORAL | Status: DC
Start: 2023-11-24 — End: 2023-11-25
  Administered 2023-11-24: 25 mg via ORAL
  Filled 2023-11-24: qty 1

## 2023-11-24 MED ORDER — PANTOPRAZOLE SODIUM 40 MG PO TBEC
40.0000 mg | DELAYED_RELEASE_TABLET | Freq: Two times a day (BID) | ORAL | Status: DC
  Administered 2023-11-24 – 2023-11-27 (×7): 40 mg via ORAL
  Filled 2023-11-24 (×7): qty 1

## 2023-11-24 MED ORDER — ROSUVASTATIN CALCIUM 20 MG PO TABS: 20.0000 mg | ORAL_TABLET | Freq: Every day | ORAL | Status: DC

## 2023-11-24 MED ORDER — METOPROLOL SUCCINATE ER 25 MG PO TB24
25.0000 mg | ORAL_TABLET | Freq: Two times a day (BID) | ORAL | Status: DC
  Administered 2023-11-24 – 2023-11-27 (×7): 25 mg via ORAL
  Filled 2023-11-24 (×7): qty 1

## 2023-11-24 MED ORDER — LEVOTHYROXINE SODIUM 100 MCG PO TABS
100.0000 ug | ORAL_TABLET | Freq: Every day | ORAL | Status: DC
  Administered 2023-11-25 – 2023-11-27 (×3): 100 ug via ORAL
  Filled 2023-11-24 (×3): qty 1

## 2023-11-24 NOTE — Progress Notes (Signed)
  Echocardiogram 2D Echocardiogram has been performed.  Daniel Herrera 11/24/2023, 1:46 PM

## 2023-11-24 NOTE — Plan of Care (Signed)
 A&Ox4, residual weakness and ataxia in left upper arm and decreased sensation in left arm. Planning to obtain MRI at 1700 for post stroke follow up. CHG bath given. Large area of bruising noted to left arm, extremity elevated for comfort.   Problem: Education: Goal: Knowledge of disease or condition will improve Outcome: Progressing Goal: Knowledge of secondary prevention will improve (MUST DOCUMENT ALL) Outcome: Progressing Goal: Knowledge of patient specific risk factors will improve (DELETE if not current risk factor) Outcome: Progressing   Problem: Ischemic Stroke/TIA Tissue Perfusion: Goal: Complications of ischemic stroke/TIA will be minimized Outcome: Progressing   Problem: Coping: Goal: Will verbalize positive feelings about self Outcome: Progressing Goal: Will identify appropriate support needs Outcome: Progressing   Problem: Health Behavior/Discharge Planning: Goal: Ability to manage health-related needs will improve Outcome: Progressing Goal: Goals will be collaboratively established with patient/family Outcome: Progressing   Problem: Self-Care: Goal: Ability to participate in self-care as condition permits will improve Outcome: Progressing Goal: Verbalization of feelings and concerns over difficulty with self-care will improve Outcome: Progressing Goal: Ability to communicate needs accurately will improve Outcome: Progressing   Problem: Nutrition: Goal: Risk of aspiration will decrease Outcome: Progressing Goal: Dietary intake will improve Outcome: Progressing   Problem: Education: Goal: Knowledge of General Education information will improve Description: Including pain rating scale, medication(s)/side effects and non-pharmacologic comfort measures Outcome: Progressing   Problem: Health Behavior/Discharge Planning: Goal: Ability to manage health-related needs will improve Outcome: Progressing   Problem: Clinical Measurements: Goal: Ability to maintain  clinical measurements within normal limits will improve Outcome: Progressing Goal: Will remain free from infection Outcome: Progressing Goal: Diagnostic test results will improve Outcome: Progressing Goal: Respiratory complications will improve Outcome: Progressing Goal: Cardiovascular complication will be avoided Outcome: Progressing   Problem: Activity: Goal: Risk for activity intolerance will decrease Outcome: Progressing   Problem: Nutrition: Goal: Adequate nutrition will be maintained Outcome: Progressing   Problem: Coping: Goal: Level of anxiety will decrease Outcome: Progressing   Problem: Elimination: Goal: Will not experience complications related to bowel motility Outcome: Progressing Goal: Will not experience complications related to urinary retention Outcome: Progressing   Problem: Pain Managment: Goal: General experience of comfort will improve and/or be controlled Outcome: Progressing   Problem: Safety: Goal: Ability to remain free from injury will improve Outcome: Progressing   Problem: Skin Integrity: Goal: Risk for impaired skin integrity will decrease Outcome: Progressing

## 2023-11-24 NOTE — Evaluation (Signed)
 Physical Therapy Evaluation Patient Details Name: Daniel Herrera MRN: [id number] DOB: 1940-10-31 Today's Date: 11/24/2023  History of Present Illness  Pt is 83 yo male who presents on 11/23/23 with c/o L sided weakness and tingling. Pt received TNK 1843.  PMH: CVA, CAD, CABG, pAfib, prostate cancer, DM2, HTN, HLD, interstitial lung disease, hereditary angioedema, R foot injury  Clinical Impression  Pt admitted with above diagnosis. Pt from home with wife where he has been independent, drives in his community, and does light work on the farm. Pt presents with decreased control and coordination of L UE and LE and c/o pulling sensation in L neck as well as slurred speech. Pt able to come to EOB and stand with mod A with L lean and mild buckling of L knee. SBP increased to 171 after standing, did not advance ambulation today. Patient will benefit from intensive inpatient follow-up therapy, >3 hours/day.  Pt currently with functional limitations due to the deficits listed below (see PT Problem List). Pt will benefit from acute skilled PT to increase their independence and safety with mobility to allow discharge.           If plan is discharge home, recommend the following: A lot of help with walking and/or transfers;A little help with bathing/dressing/bathroom;Assistance with cooking/housework;Assist for transportation;Help with stairs or ramp for entrance   Can travel by private vehicle        Equipment Recommendations None recommended by PT  Recommendations for Other Services  OT consult;Rehab consult    Functional Status Assessment Patient has had a recent decline in their functional status and demonstrates the ability to make significant improvements in function in a reasonable and predictable amount of time.     Precautions / Restrictions Precautions Precautions: Fall Restrictions Weight Bearing Restrictions Per Provider Order: No      Mobility  Bed Mobility Overal bed mobility:  Needs Assistance Bed Mobility: Supine to Sit, Sit to Supine     Supine to sit: Mod assist Sit to supine: Min assist   General bed mobility comments: mod A for balance coming up to sitting. Pt able to return to supine and get LLE into bed but min A needed to position    Transfers Overall transfer level: Needs assistance Equipment used: Rolling walker (2 wheels) Transfers: Sit to/from Stand Sit to Stand: Mod assist           General transfer comment: mod A for power up and to control L lean. Needed assist to keep L hand on RW. Assist at L knee in standing due to instability    Ambulation/Gait               General Gait Details: deferred today  Stairs            Wheelchair Mobility     Tilt Bed    Modified Rankin (Stroke Patients Only) Modified Rankin (Stroke Patients Only) Pre-Morbid Rankin Score: No symptoms Modified Rankin: Moderate disability     Balance Overall balance assessment: Needs assistance Sitting-balance support: Single extremity supported, Feet supported Sitting balance-Leahy Scale: Fair Sitting balance - Comments: L lean in sitting, corrects with vc's, CGA Postural control: Left lateral lean Standing balance support: Bilateral upper extremity supported, During functional activity Standing balance-Leahy Scale: Poor Standing balance comment: L lateral lean, mod A to maintain standing  Pertinent Vitals/Pain Pain Assessment Pain Assessment: Faces Faces Pain Scale: Hurts a little bit Pain Location: L sided neck Pain Descriptors / Indicators: Tightness Pain Intervention(s): Limited activity within patient's tolerance, Monitored during session    Home Living Family/patient expects to be discharged to:: Private residence Living Arrangements: Spouse/significant other Available Help at Discharge: Family;Available 24 hours/day Type of Home: House Home Access: Stairs to enter;Ramped entrance        Home Layout: One level Home Equipment: Agricultural consultant (2 wheels);Shower seat;Grab bars - toilet;Grab bars - tub/shower      Prior Function Prior Level of Function : Independent/Modified Independent             Mobility Comments: was not using AD PTA ADLs Comments: independent     Extremity/Trunk Assessment   Upper Extremity Assessment Upper Extremity Assessment: Defer to OT evaluation (dysmetria LUE)    Lower Extremity Assessment Lower Extremity Assessment: LLE deficits/detail LLE Deficits / Details: decreased sensation and proprioception of LLE, decreased coordination with mvmt, mild L knee buckling in standing LLE Sensation: decreased proprioception;decreased light touch;history of peripheral neuropathy LLE Coordination: decreased gross motor    Cervical / Trunk Assessment Cervical / Trunk Assessment: Normal  Communication   Communication Communication: Impaired Factors Affecting Communication: Hearing impaired;Reduced clarity of speech (slurring)    Cognition Arousal: Alert Behavior During Therapy: WFL for tasks assessed/performed, Impulsive   PT - Cognitive impairments: Attention, Problem solving, Safety/Judgement                       PT - Cognition Comments: pt verbalizes that he can't control his L side but decreased attention to that side with mobility Following commands: Impaired Following commands impaired: Only follows one step commands consistently     Cueing Cueing Techniques: Verbal cues, Gestural cues, Tactile cues     General Comments General comments (skin integrity, edema, etc.): SBP increased from 132 to 166 from supine to standing. SPO2 and HR stable    Exercises     Assessment/Plan    PT Assessment Patient needs continued PT services  PT Problem List Decreased strength;Decreased coordination;Decreased cognition;Decreased balance;Decreased mobility;Decreased safety awareness;Decreased knowledge of precautions;Impaired  sensation;Pain       PT Treatment Interventions DME instruction;Gait training;Stair training;Functional mobility training;Therapeutic activities;Therapeutic exercise;Balance training;Neuromuscular re-education;Cognitive remediation;Patient/family education    PT Goals (Current goals can be found in the Care Plan section)  Acute Rehab PT Goals Patient Stated Goal: return home PT Goal Formulation: With patient/family Time For Goal Achievement: 12/08/23 Potential to Achieve Goals: Good    Frequency Min 2X/week     Co-evaluation               AM-PAC PT 6 Clicks Mobility  Outcome Measure Help needed turning from your back to your side while in a flat bed without using bedrails?: A Little Help needed moving from lying on your back to sitting on the side of a flat bed without using bedrails?: A Little Help needed moving to and from a bed to a chair (including a wheelchair)?: A Lot Help needed standing up from a chair using your arms (e.g., wheelchair or bedside chair)?: A Lot Help needed to walk in hospital room?: Total Help needed climbing 3-5 steps with a railing? : Total 6 Click Score: 12    End of Session Equipment Utilized During Treatment: Gait belt Activity Tolerance: Patient tolerated treatment well Patient left: in bed;with call bell/phone within reach;with bed alarm set;with family/visitor present Nurse Communication: Mobility status  PT Visit Diagnosis: Unsteadiness on feet (R26.81);Hemiplegia and hemiparesis;Pain;Difficulty in walking, not elsewhere classified (R26.2) Hemiplegia - Right/Left: Left Hemiplegia - dominant/non-dominant: Non-dominant Hemiplegia - caused by: Cerebral infarction Pain - Right/Left: Left Pain - part of body:  (neck)    Time: 8543-8461 PT Time Calculation (min) (ACUTE ONLY): 42 min   Charges:   PT Evaluation $PT Eval Moderate Complexity: 1 Mod PT Treatments $Therapeutic Exercise: 23-37 mins PT General Charges $$ ACUTE PT VISIT: 1  Visit         Richerd Lipoma, PT  Acute Rehab Services Secure chat preferred Office [phone]   Richerd CROME Sophia Sperry 11/24/2023, 3:51 PM

## 2023-11-24 NOTE — Progress Notes (Signed)
 OT Cancellation Note  Patient Details Name: Daniel Herrera MRN: [id number] DOB: 03/04/1941   Cancelled Treatment:    Reason Eval/Treat Not Completed: Active bedrest order (pt not going to MRI until later this evening; will f/u for OT eval next date as appropriate.)  [name], OTD, OTR/L SecureChat Preferred Acute Rehab [phone]   [name] 11/24/2023, 12:34 PM

## 2023-11-24 NOTE — Progress Notes (Addendum)
 STROKE TEAM PROGRESS NOTE    SIGNIFICANT HOSPITAL EVENTS 8/22- TNK given, admit to 4NICU  INTERIM HISTORY/SUBJECTIVE TNK 1843- MRI pending for 24 hours. Seen in room with wife at the bedside. Feels strength is improved on the left but does still have some trouble controlling his left hand and some drift noted.   OBJECTIVE  CBC    Component Value Date/Time   WBC 5.8 11/23/2023 1814   RBC 4.19 (L) 11/23/2023 1814   HGB 13.3 11/23/2023 1815   HGB 15.4 09/12/2017 1034   HCT 39.0 11/23/2023 1815   HCT 46.3 09/12/2017 1034   PLT 182 11/23/2023 1814   PLT 172 09/12/2017 1034   MCV 94.7 11/23/2023 1814   MCV 96 09/12/2017 1034   MCH 32.0 11/23/2023 1814   MCHC 33.8 11/23/2023 1814   RDW 12.4 11/23/2023 1814   RDW 14.0 09/12/2017 1034   LYMPHSABS 1.2 11/23/2023 1814   LYMPHSABS 1.0 09/12/2017 1034   MONOABS 0.6 11/23/2023 1814   EOSABS 0.1 11/23/2023 1814   EOSABS 0.1 09/12/2017 1034   BASOSABS 0.1 11/23/2023 1814   BASOSABS 0.0 09/12/2017 1034    BMET    Component Value Date/Time   NA 140 11/23/2023 1815   K 4.4 11/23/2023 1815   CL 104 11/23/2023 1815   CO2 25 11/23/2023 1814   GLUCOSE 120 (H) 11/23/2023 1815   BUN 25 (H) 11/23/2023 1815   CREATININE 1.90 (H) 11/23/2023 1815   CREATININE 1.29 (H) 04/02/2015 1146   CALCIUM  9.8 11/23/2023 1814   EGFR 42.0 06/30/2023 1223   EGFR 44 05/20/2022 1443   GFRNONAA 35 (L) 11/23/2023 1814    IMAGING past 24 hours CT ANGIO HEAD NECK W WO CM (CODE STROKE) Result Date: 11/23/2023 CLINICAL DATA:  Neuro deficit, acute, stroke suspected EXAM: CT ANGIOGRAPHY HEAD AND NECK WITH AND WITHOUT CONTRAST TECHNIQUE: Multidetector CT imaging of the head and neck was performed using the standard protocol during bolus administration of intravenous contrast. Multiplanar CT image reconstructions and MIPs were obtained to evaluate the vascular anatomy. Carotid stenosis measurements (when applicable) are obtained utilizing NASCET criteria, using the  distal internal carotid diameter as the denominator. RADIATION DOSE REDUCTION: This exam was performed according to the departmental dose-optimization program which includes automated exposure control, adjustment of the mA and/or kV according to patient size and/or use of iterative reconstruction technique. CONTRAST:  75mL OMNIPAQUE  IOHEXOL  350 MG/ML SOLN COMPARISON:  CTA head/neck March 14, 23 FINDINGS: CTA NECK FINDINGS Aortic arch: Great vessel origins are patent without significant stenosis. Right carotid system: No evidence of dissection, stenosis (50% or greater), or occlusion. Left carotid system: No evidence of dissection, stenosis (50% or greater), or occlusion. Vertebral arteries: Right dominant. The right vertebral arteries patent without significant stenosis. The left vertebral artery is occluded at its distal V2 segment with poor/irregular opacification more proximally. Skeleton: No evidence of acute abnormality limited assessment. Other neck: No acute abnormality on limited assessment. Upper chest: Biapical pleuroparenchymal scarring. Review of the MIP images confirms the above findings CTA HEAD FINDINGS Anterior circulation: Bilateral intracranial ICAs, MCAs, and ACAs are patent. Moderate left A2 ACA stenosis. Severe right M2 MCA stenosis. Posterior circulation: The left intradural vertebral artery is largely non-opacified. Patent right vertebral artery with mild distal stenosis. The basilar artery and bilateral posterior is are patent. Severe left and multifocal moderate to severe right P2 PCA stenosis. Venous sinuses: As permitted by contrast timing, patent. Review of the MIP images confirms the above findings IMPRESSION: 1. Chronically occluded  left vertebral artery. 2. Severe left and moderate to severe right P2 PCA stenosis. 3. Severe right M2 MCA stenosis. 4. Moderate left A2 ACA stenosis. 5.  Aortic Atherosclerosis (ICD10-I70.0). Electronically Signed   By: Gilmore GORMAN Molt M.D.   On:  11/23/2023 19:14   CT HEAD CODE STROKE WO CONTRAST Result Date: 11/23/2023 CLINICAL DATA:  Code stroke. Neuro deficit, acute, stroke suspected. EXAM: CT HEAD WITHOUT CONTRAST TECHNIQUE: Contiguous axial images were obtained from the base of the skull through the vertex without intravenous contrast. RADIATION DOSE REDUCTION: This exam was performed according to the departmental dose-optimization program which includes automated exposure control, adjustment of the mA and/or kV according to patient size and/or use of iterative reconstruction technique. COMPARISON:  Head CT 09/03/2022. FINDINGS: Brain: Generalized cerebral atrophy. Patchy and ill-defined hypoattenuation within the cerebral white matter, nonspecific but compatible with mild chronic small vessel ischemic disease. Redemonstrated small chronic infarcts within the left cerebellar hemisphere. There is no acute intracranial hemorrhage. No demarcated cortical infarct. No extra-axial fluid collection. No evidence of an intracranial mass. No midline shift. Vascular: No hyperdense vessel.  Atherosclerotic calcifications. Skull: No calvarial fracture or aggressive osseous lesion. Sinuses/Orbits: No mass or acute finding within the imaged orbits. Severe mucosal thickening within the left maxillary sinus. ASPECTS [facility] Stroke Program Early CT Score) - Ganglionic level infarction (caudate, lentiform nuclei, internal capsule, insula, M1-M3 cortex): 7 - Supraganglionic infarction (M4-M6 cortex): 3 Total score (0-10 with 10 being normal): 10 No evidence of an acute intracranial abnormality. These results were communicated to Dr. Michaela at 6:48 pmon 8/22/2025by text page via the [facility] messaging system. IMPRESSION: 1.  No evidence of an acute intracranial abnormality. 2. Parenchymal atrophy and chronic small vessel ischemic disease. 3. Redemonstrated small chronic infarcts within the left cerebellar hemisphere. 4. Severe mucosal thickening within the left  maxillary sinus. Electronically Signed   By: Rockey Childs D.O.   On: 11/23/2023 18:48    Vitals:   11/24/23 0530 11/24/23 0600 11/24/23 0630 11/24/23 0700  BP: 134/67 (!) 148/74 (!) 156/71 (!) 157/70  Pulse: (!) 57 61 66 65  Resp:      Temp:      TempSrc:      SpO2: 95% 97% 96% 96%  Weight:      Height:         PHYSICAL EXAM General:  Alert, well-nourished, well-developed patient in no acute distress Psych:  Mood and affect appropriate for situation CV: Regular rate and rhythm on monitor Respiratory:  Regular, unlabored respirations on room air GI: Abdomen soft and nontender   NEURO:  Mental Status: AA&Ox3, patient is able to give clear and coherent history Speech/Language: very mild pharyngeal aphasia.  Naming, repetition, fluency, and comprehension intact.  Cranial Nerves:  II: PERRL. Visual fields full.  III, IV, VI: EOMI. Eyelids elevate symmetrically.  V: Sensation is intact to light touch and symmetrical to face.  VII: Face is symmetrical resting and smiling VIII: hearing intact to voice. IX, X: Palate elevates symmetrically. Phonation is normal.  KP:Dynloizm shrug 5/5. XII: tongue is midline without fasciculations. Motor:  LUE 4/5 with drift  LLE and RLE with full strength  Tone: is normal and bulk is normal Sensation- Sensation diminished on the left upper and lower extremity  Coordination: LUE ataxia out of proportion to weakness Gait- deferred  Most Recent NIH 3     ASSESSMENT/PLAN  Daniel Herrera is a 83 y.o. male with history of hereditary angioedema type II, PAF on ASA,  HTN, HLD, CAD s/p CABG, MI, prior stroke, CHF, CKD, GERD, hx of GI bleed, history of prostate cancer, hypothyroidism, diabetes who presents to [facility], ED via private vehicle for acute onset of left-sided weakness.  NIH on Admission 9  Acute Ischemic Infarct:  Suspect right hemispheric infarct s/p TNK  Etiology:  likely hypertensive vs intracranial atherosclerosis though hx  of pAFib Code Stroke CT head No acute abnormality.  CTA head & neck Severe left and moderate to severe right P2 PCA stenosis. Severe right M2 MCA stenosis. Moderate left A2 ACA stenosis. Chronically occluded left vertebral artery.  MRI  Pending  2D Echo Pending  LDL 106 HgbA1c 5.6 VTE prophylaxis - SCDs aspirin  81 mg daily prior to admission, now on No antithrombotic until 24 hours post TNK - will consider anticoagulation pending MRI, risk benefit discussion as has history of hemorrhoidal bleeding.  Therapy recommendations:  Pending Disposition: ICU  Hx of Stroke/TIA 06/15/2021- Left PICA infarct likely secondary left vertebral occlusion at the left V2  Discharged with plan for ASA 325 abd plavix  75mg  for 3 months and then plavix  alone. ASA decreased to 81mg  due to ecchymosis and stopped plavix  in 2023 due to bruising, he did continue the ASA 81mg . Pitavastatin  4mg  and zetia  10mg , pt stated intolerance with other statins (lipitor , crestor , pravastatin ). EF 55-60%. Follows with Dr[facility] Edwyna at [facility]  Post op Paroxysmal Atrial fibrillation after CABG Continue telemetry monitoring Hx lower GI bleed (hemorrhoids and r/t cancer treatment) No hx of anticoagulation  Long term monitor in 2024- Patient had a min HR of 47 bpm, max HR of 130 bpm, and avg HR of 65 bpm.  Predominant underlying rhythm was Sinus Rhythm.  First Degree AV Block was present.  17 Supraventricular Tachycardia runs occurred, the run with the fastest interval lasting 7 beats with a max rate of 130 bpm, the longest lasting 16 beats with an avg rate of 111 bpm. Some episodes of Supraventricular Tachycardia may be possible Atrial Tachycardia with variable block. Isolated SVEs were rare (<1.0%), SVE Couplets were rare (<1.0%), and SVE Triplets were rare (<1.0%).  Isolated VEs were occasional (1.2%, 12241), VE Couplets were rare (<1.0%, 36), and no VE Triplets were present. Ventricular Bigeminy was present. Inverted QRS  complexes possibly due to inverted placement of device.  CHF Hypertension Home meds:  Norvasc , losartan   Stable Blood Pressure Goal: BP less than 180/105   CAD s/p stenting  Hyperlipidemia Home meds:  Pitavastatin  LDL 106, goal < 70 Change to crestor  20mg  Continue statin at discharge  Dysphagia Patient has post-stroke dysphagia, SLP consulted    Diet   Diet Heart Room service appropriate? Yes; Fluid consistency: Thin   Advance diet as tolerated  Other Stroke Risk Factors Coronary artery disease s/p CABG CHF  Other Active Problems Hereditary angioedema - patient medication is in pharmacy fridge  CKD Cr 1.80, GFR 36 - appears stable  GERD Hypothyroidism Hx of LGIB, was told to related to prostate cancer radiation Hx of hematuria, was told to related to prostate cancer radiation Prostate cancer status post radiation  Hospital day # 1   Patient seen and examined by NP/APP with MD. MD to update note as needed.   Jorene Last, DNP, FNP-BC [facility] Pager: [phone]  Attending Neurohospitalist Addendum Patient seen and examined with APP/Resident. Agree with the history and physical as documented above. Agree with the plan as documented, which I helped formulate. I have independently reviewed the chart, obtained history, review of systems  and examined the patient.I have personally reviewed pertinent head/neck/spine imaging (CT/MRI). Please feel free to call with any questions.  Sarajane Daring, MD [facility]   To contact Stroke Continuity provider, please refer to [email or web address]. After hours, contact General Neurology

## 2023-11-25 ENCOUNTER — Inpatient Hospital Stay (HOSPITAL_COMMUNITY)

## 2023-11-25 LAB — GLUCOSE, CAPILLARY
Glucose-Capillary: 133 mg/dL — ABNORMAL HIGH (ref 70–99)
Glucose-Capillary: 119 mg/dL — ABNORMAL HIGH (ref 70–99)
Glucose-Capillary: 131 mg/dL — ABNORMAL HIGH (ref 70–99)
Glucose-Capillary: 108 mg/dL — ABNORMAL HIGH (ref 70–99)

## 2023-11-25 MED ORDER — EZETIMIBE 10 MG PO TABS
10.0000 mg | ORAL_TABLET | Freq: Every day | ORAL | Status: DC
  Administered 2023-11-25 – 2023-11-27 (×3): 10 mg via ORAL
  Filled 2023-11-25 (×3): qty 1

## 2023-11-25 MED ORDER — LOSARTAN POTASSIUM 50 MG PO TABS: 50.0000 mg | ORAL_TABLET | Freq: Every day | ORAL | Status: DC

## 2023-11-25 MED ORDER — LABETALOL HCL 5 MG/ML IV SOLN: 10.0000 mg | INTRAVENOUS | Status: DC | PRN

## 2023-11-25 MED ORDER — HYDRALAZINE HCL 20 MG/ML IJ SOLN: 10.0000 mg | INTRAMUSCULAR | Status: DC | PRN

## 2023-11-25 MED ORDER — LOSARTAN POTASSIUM 25 MG PO TABS
25.0000 mg | ORAL_TABLET | Freq: Every day | ORAL | Status: DC
  Administered 2023-11-25 – 2023-11-27 (×3): 25 mg via ORAL
  Filled 2023-11-25 (×3): qty 1

## 2023-11-25 MED ORDER — ASPIRIN 81 MG PO TBEC
81.0000 mg | DELAYED_RELEASE_TABLET | Freq: Every day | ORAL | Status: DC
  Administered 2023-11-25 – 2023-11-27 (×3): 81 mg via ORAL
  Filled 2023-11-25 (×3): qty 1

## 2023-11-25 MED ORDER — POLYVINYL ALCOHOL 1.4 % OP SOLN
1.0000 [drp] | OPHTHALMIC | Status: DC | PRN
  Administered 2023-11-25 (×2): 1 [drp] via OPHTHALMIC
  Filled 2023-11-25: qty 15

## 2023-11-25 MED ORDER — FLUTICASONE PROPIONATE 50 MCG/ACT NA SUSP
1.0000 | Freq: Two times a day (BID) | NASAL | Status: DC
  Administered 2023-11-25 – 2023-11-27 (×5): 1 via NASAL
  Filled 2023-11-25: qty 16

## 2023-11-25 MED ORDER — AMLODIPINE BESYLATE 2.5 MG PO TABS
2.5000 mg | ORAL_TABLET | Freq: Every day | ORAL | Status: DC
  Administered 2023-11-25 – 2023-11-27 (×3): 2.5 mg via ORAL
  Filled 2023-11-25 (×3): qty 1

## 2023-11-25 NOTE — Evaluation (Signed)
 Speech Language Pathology Evaluation Patient Details Name: Daniel Herrera MRN: [id number] DOB: 1940/10/05 Today's Date: 11/25/2023 Time: 8374-8358 SLP Time Calculation (min) (ACUTE ONLY): 16 min  Problem List:  Patient Active Problem List   Diagnosis Date Noted   Hypothyroidism, unspecified 06/15/2021   GERD without esophagitis 06/15/2021   Type 2 diabetes mellitus without complications (HCC) 06/15/2021   Stroke (cerebrum) (HCC) 06/15/2021   Cerebellar stroke (HCC) 06/14/2021   Open metatarsal fracture 09/02/2020   Dysrhythmia    Geographic tongue    History of kidney stones    Hypothyroidism    Peripheral vascular disease (HCC)    Pre-diabetes    Urticaria    Thyroid  disease    Skin cancer    Prostate enlargement    Prostate cancer (HCC)    Personal history of digestive disease    Myocardial infarction (HCC)    Hypertension    Hx of umbilical hernia repair    Hx of Clostridium difficile infection    History of prolonged Q-T interval on ECG    High cholesterol    Gout    GERD (gastroesophageal reflux disease)    Arthritis    Anxiety    Angio-edema    Cataract    Diverticular hemorrhage    Lower GI bleed 06/03/2019   Acute lower GI bleeding 05/28/2019   Coronary artery disease 05/28/2019   Diverticulosis    Glossitis, benign migratory 12/04/2018   Hoarseness 12/04/2018   PAF (paroxysmal atrial fibrillation) (HCC) 06/13/2018   S/P CABG x 3 05/28/2018   NSTEMI (non-ST elevated myocardial infarction) (HCC) 05/21/2018   Tachycardia determined by examination of pulse    CHF (congestive heart failure) (HCC) 2020   Pre-operative clearance 01/02/2018   Hematochezia 12/21/2017   CKD (chronic kidney disease) stage 3, GFR 30-59 ml/min 12/21/2017   GI bleed 12/21/2017   Hereditary angioedema type 2 (HCC) 07/28/2017   Enterotoxigenic Escherichia coli infection 05/24/2015   STEC (Shiga toxin-producing Escherichia coli) infection 05/24/2015   Colitis 05/23/2015   History  of prostate cancer    Chronic diarrhea of unknown origin    CAD S/P percutaneous coronary angioplasty    Type 2 diabetes mellitus with unspecified complications (HCC)    Palpitations 03/15/2015   History of non-ST elevation myocardial infarction (NSTEMI) 02/22/2015   Abnormal EKG 02/21/2015   Essential hypertension 02/21/2015   Dyslipidemia, goal LDL below 70 02/21/2015   Malignant neoplasm of prostate (HCC) 08/07/2014   Anginal pain (HCC) 2016   ILD (interstitial lung disease) (HCC) 12/17/2013   Past Medical History:  Past Medical History:  Diagnosis Date   Abnormal EKG 02/21/2015   Acute lower GI bleeding 05/28/2019   Anginal pain (HCC) 2016   Angio-edema    Anxiety    Arthritis    CAD S/P percutaneous coronary angioplasty    NSTEMI treated with LAD PCI with DES x 03 Feb 2015 Myoview low risk Nov 2017   Cataract    Cerebellar stroke (HCC) 06/14/2021   CHF (congestive heart failure) (HCC) 2020   after surgery only   Chronic diarrhea of unknown origin    CKD (chronic kidney disease) stage 3, GFR 30-59 ml/min 12/21/2017   Colitis 05/23/2015   Coronary artery disease 05/28/2019   Diverticular hemorrhage    Diverticulosis    Dyslipidemia, goal LDL below 70 02/21/2015   hyperlipidemia    Dysrhythmia    A-fib   Enterotoxigenic Escherichia coli infection 05/24/2015   Essential hypertension 02/21/2015   Geographic tongue  GERD (gastroesophageal reflux disease)    GERD without esophagitis 06/15/2021   GI bleed 12/21/2017   Glossitis, benign migratory 12/04/2018   Gout    Hematochezia 12/21/2017   Admitted Sept 2019 with lower GI bleeding felt to be secondary to hemorrhoids   Hereditary angioedema type 2 (HCC) 07/28/2017   High cholesterol    History of kidney stones    History of non-ST elevation myocardial infarction (NSTEMI) 02/22/2015   Nov 2016   History of prolonged Q-T interval on ECG    History of prostate cancer    Hoarseness 12/04/2018   Hx of Clostridium  difficile infection    Hx of umbilical hernia repair    Hypertension    Hypothyroidism    Hypothyroidism, unspecified 06/15/2021   ILD (interstitial lung disease) (HCC) 12/17/2013   ?related to chicken farm exposure or aspiration pneumonitis from GERD Previously attributed to acute interstitial pneumonitis but based on HRCT this is likely IPF, probable UIP   Lower GI bleed 06/03/2019   Malignant neoplasm of prostate (HCC) 08/07/2014   Myocardial infarction [employer]) 2016   NSTEMI (non-ST elevated myocardial infarction) (HCC) 05/21/2018   Open metatarsal fracture 09/02/2020   PAF (paroxysmal atrial fibrillation) (HCC) 06/13/2018   Post CABG- discharged on Amiodarone , he will probably not need this long term   Palpitations 03/15/2015   palpitations    Peripheral vascular disease (HCC)    vericose veins   Personal history of digestive disease    gastric ulcer   Pre-diabetes    Pre-operative clearance 01/02/2018   Prostate cancer (HCC)    Prostate enlargement    S/P CABG x 3 05/28/2018    LIMA-LAD, RIMA-RCA, and Lt radial to OM.   Skin cancer    basal cell carcinoma   STEC (Shiga toxin-producing Escherichia coli) infection 05/24/2015   Stroke (cerebrum) (HCC) 06/15/2021   Tachycardia determined by examination of pulse    Thyroid  disease    Type 2 diabetes mellitus with unspecified complications (HCC)    Type 2 diabetes mellitus without complications (HCC) 06/15/2021   Urticaria    Past Surgical History:  Past Surgical History:  Procedure Laterality Date   APPENDECTOMY     BACK SURGERY     CARDIAC CATHETERIZATION N/A 02/21/2015   Procedure: Left Heart Cath;  Surgeon: Dorn JINNY Lesches, MD;  Location: [facility] INVASIVE CV LAB;  Service: Cardiovascular;  Laterality: N/A;   CATARACT EXTRACTION, BILATERAL     CORONARY ARTERY BYPASS GRAFT N/A 05/24/2018   Procedure: CORONARY ARTERY BYPASS GRAFTING (CABG), ON PUMP, TIMES THREE, USING BILATERAL INTERNAL MAMMARY ARTERY AND HARVESTED LEFT RADIAL  ARTERY;  Surgeon: Lucas Dorise POUR, MD;  Location: [facility] OR;  Service: Open Heart Surgery;  Laterality: N/A;   CYSTOSCOPY WITH LITHOLAPAXY N/A 07/26/2020   Procedure: CYSTOSCOPY WITH LITHOLAPAXY WITH FULGERATION;  Surgeon: Renda Glance, MD;  Location: [facility] ORS;  Service: Urology;  Laterality: N/A;   HOLMIUM LASER APPLICATION N/A 07/26/2020   Procedure: HOLMIUM LASER APPLICATION;  Surgeon: Renda Glance, MD;  Location: [facility] ORS;  Service: Urology;  Laterality: N/A;   I & D EXTREMITY Right 09/02/2020   Procedure: IRRIGATION AND DEBRIDEMENT RIGHT 2ND AND 3RD METATARSAL;  Surgeon: Kit Rush, MD;  Location: [facility] OR;  Service: Orthopedics;  Laterality: Right;   LEFT HEART CATH AND CORONARY ANGIOGRAPHY N/A 05/22/2018   Procedure: LEFT HEART CATH AND CORONARY ANGIOGRAPHY;  Surgeon: Dann Candyce RAMAN, MD;  Location: [facility] INVASIVE CV LAB;  Service: Cardiovascular;  Laterality: N/A;   NASAL SINUS SURGERY  ORIF TOE FRACTURE Right 09/02/2020   Procedure: OPEN REDUCTION INTERNAL FIXATION (ORIF) OF RIGHT OPEN 2ND AND 3RD METATARSAL (TOE) OPEN FRACTURE;  Surgeon: Kit Rush, MD;  Location: [facility] OR;  Service: Orthopedics;  Laterality: Right;   PROSTATE BIOPSY     RADIAL ARTERY HARVEST Left 05/24/2018   Procedure: RADIAL ARTERY HARVEST;  Surgeon: Lucas Dorise POUR, MD;  Location: [facility] OR;  Service: Open Heart Surgery;  Laterality: Left;   right shoulder rotator cuff repair     TEE WITHOUT CARDIOVERSION N/A 05/24/2018   Procedure: TRANSESOPHAGEAL ECHOCARDIOGRAM (TEE);  Surgeon: Lucas Dorise POUR, MD;  Location: [facility] OR;  Service: Open Heart Surgery;  Laterality: N/A;   HPI:  Pt is 83 yo male who presents on 11/23/23 with c/o L sided weakness and tingling. Pt received TNK 1843.  PMH: CVA, CAD, CABG, pAfib, prostate cancer, DM2, HTN, HLD, interstitial lung disease, hereditary angioedema, R foot injury   Assessment / Plan / Recommendation Clinical Impression   Pt seen for skilled ST services for speech-language and cognitive  evaluation. The pt presents with a very mild dysarthria (100% intelligibility) and is at cognitive baseline. The pt was given the [facility] [place]  Mental Status Exam (SLUMS), where he scored a 25/50, which is on the boarder of normal cognitive functioning limits and mild cognitive impairment. Per pt reports, he has some delayed recall skills at baseline. His speech was slightly dysarthric, with a mild imprecision of sounds, especially consonants, in spontaneous conversational speech. Both the pt and his family reports that his speech has made great improvement and is practically at full return to baseline. The pt reports he's not concerned about how his speech sounds. Pt does not present with an acute cognitive impairment and if a desire to address dysarthria can be addressed through OPT ST. No further f/u required, SLP signing off.    SLP Assessment  SLP Recommendation/Assessment: Patient does not need any further Speech Language Pathology Services SLP Visit Diagnosis: Dysarthria and anarthria (R47.1);Cognitive communication deficit (R41.841)     Assistance Recommended at Discharge     Functional Status Assessment Patient has had a recent decline in their functional status and demonstrates the ability to make significant improvements in function in a reasonable and predictable amount of time.  Frequency and Duration           SLP Evaluation Cognition  Overall Cognitive Status: Within Functional Limits for tasks assessed Arousal/Alertness: Awake/alert Orientation Level: Oriented X4 Year: 2025 Month: August Day of Week: Correct Attention: Focused;Alternating;Sustained Focused Attention: Appears intact Sustained Attention: Appears intact Alternating Attention: Impaired Alternating Attention Impairment: Functional complex Memory: Impaired Memory Impairment: Decreased short term memory Decreased Short Term Memory: Verbal complex Awareness: Appears intact Problem Solving:  Appears intact Executive Function: Organizing Organizing: Appears intact Safety/Judgment: Appears intact       Comprehension       Expression Written Expression Dominant Hand: Right   Oral / Motor  Motor Speech Overall Motor Speech: Impaired Respiration: Within functional limits Phonation: Normal Resonance: Within functional limits Articulation: Impaired Level of Impairment: Conversation Intelligibility: Intelligible Motor Planning: Within functional limits Interfering Components: Hearing loss Effective Techniques: Over-articulate           Manuelita Blew M.S. CCC-SLP

## 2023-11-25 NOTE — Progress Notes (Signed)
 Inpatient Rehab Admissions Coordinator Note:   Per therapy patient was screened for CIR candidacy by Erique Kaser SHAUNNA Yvone Cohens, CCC-SLP. At this time, pt appears to be a potential candidate for CIR. I will place an order for rehab consult for full assessment, per our protocol.  Please contact me any with questions.SABRA Tinnie Yvone Cohens, MS, CCC-SLP Admissions Coordinator [phone] 11/25/23 4:04 PM

## 2023-11-25 NOTE — Progress Notes (Addendum)
 STROKE TEAM PROGRESS NOTE    SIGNIFICANT HOSPITAL EVENTS 8/22- TNK given, admit to 4NICU 8/23 - stable, improving exam  INTERIM HISTORY/SUBJECTIVE Sitting up in the chair, family at the bedside. Neuro exam improving, patient reports strength and ataxia are improving but he does still feel like his hand doesn't always listen to him. Transfer out of ICU today.   OBJECTIVE  CBC    Component Value Date/Time   WBC 5.8 11/24/2023 1146   RBC 4.01 (L) 11/24/2023 1146   HGB 12.7 (L) 11/24/2023 1146   HGB 15.4 09/12/2017 1034   HCT 37.4 (L) 11/24/2023 1146   HCT 46.3 09/12/2017 1034   PLT 170 11/24/2023 1146   PLT 172 09/12/2017 1034   MCV 93.3 11/24/2023 1146   MCV 96 09/12/2017 1034   MCH 31.7 11/24/2023 1146   MCHC 34.0 11/24/2023 1146   RDW 12.5 11/24/2023 1146   RDW 14.0 09/12/2017 1034   LYMPHSABS 1.2 11/23/2023 1814   LYMPHSABS 1.0 09/12/2017 1034   MONOABS 0.6 11/23/2023 1814   EOSABS 0.1 11/23/2023 1814   EOSABS 0.1 09/12/2017 1034   BASOSABS 0.1 11/23/2023 1814   BASOSABS 0.0 09/12/2017 1034    BMET    Component Value Date/Time   NA 138 11/24/2023 1146   K 4.3 11/24/2023 1146   CL 104 11/24/2023 1146   CO2 25 11/24/2023 1146   GLUCOSE 123 (H) 11/24/2023 1146   BUN 19 11/24/2023 1146   CREATININE 1.80 (H) 11/24/2023 1146   CREATININE 1.29 (H) 04/02/2015 1146   CALCIUM  9.4 11/24/2023 1146   EGFR 42.0 06/30/2023 1223   EGFR 44 05/20/2022 1443   GFRNONAA 36 (L) 11/24/2023 1146    IMAGING past 24 hours CT HEAD WO CONTRAST ( ) Result Date: 11/25/2023 CLINICAL DATA:  83 year old male code stroke presentation on 11/23/2023. Chronically occluded left vertebral artery. Status post TNK, possible trace left vertex subarachnoid hemorrhage on post treatment MRI. EXAM: CT HEAD WITHOUT CONTRAST TECHNIQUE: Contiguous axial images were obtained from the base of the skull through the vertex without intravenous contrast. RADIATION DOSE REDUCTION: This exam was performed  according to the departmental dose-optimization program which includes automated exposure control, adjustment of the mA and/or kV according to patient size and/or use of iterative reconstruction technique. COMPARISON:  Brain MRI 1711 hours 11/24/2023. Presentation head CT 11/23/2023. FINDINGS: Brain: Hypodensity at the right thalamic lacunar infarct site corresponding to DWI findings (series 3, image 17). No mass effect. No ventriculomegaly. Patchy chronic left cerebellar infarcts redemonstrated. Stable gray-white differentiation elsewhere. Trace subarachnoid hemorrhage is confirmed along the left vertex. Compare sagittal image 43 today to series 6, image 21 on 11/23/2023. Heidelberg classification 3c: Subarachnoid hemorrhage. No intracranial mass effect. No IVH. No ventriculomegaly. No other intracranial hemorrhage identified. Vascular: Calcified atherosclerosis at the skull base. No suspicious intracranial vascular hyperdensity. Skull: Appears stable and intact. Sinuses/Orbits: Left maxillary sinus fluid and mucosal thickening is stable. Other: No acute orbit or scalp soft tissue finding. IMPRESSION: 1. Subtle but confirmed trace left vertex subarachnoid hemorrhage (sagittal image 43). No intracranial mass effect or other complicating features. 2. Right thalamic infarct stable from MRI yesterday. 3. No other acute intracranial abnormality. Salient findings were communicated to Dr. MAGDALENO Blower at 6:04 am on 11/25/2023 by text page via the [facility] messaging system. Electronically Signed   By: VEAR Hurst M.D.   On: 11/25/2023 06:05   MR BRAIN WO CONTRAST Result Date: 11/25/2023 CLINICAL DATA:  83 year old male code stroke presentation on 11/23/2023. Chronically occluded left vertebral  artery. Status post TNK. EXAM: MRI HEAD WITHOUT CONTRAST TECHNIQUE: Multiplanar, multiecho pulse sequences of the brain and surrounding structures were obtained without intravenous contrast. COMPARISON:  CT head and CTA head and neck  11/23/2018. Brain MRI 06/14/2021. FINDINGS: Brain: Oval 15 mm focus of intense restricted diffusion right thalamus, near the posterior limb right internal capsule (series 2, image 26). T2 and FLAIR hyperintensity. No hemorrhage or mass effect. Contralateral left superior frontal lobe subcortical white matter subcentimeter diffusion heterogeneity on series 2, image 12 appears to be either T2 shine through or susceptibility associated with chronic hemosiderin there on SWI (series 7, image 19) which is new since 2023. No hyperdense hemorrhage there by CT 1 day earlier., however, there is trace abnormal increased FLAIR signal in the sulci there (series 6, image 5). No regional edema or mass effect. No IVH or other acute intracranial hemorrhage identified. Chronic microhemorrhage in the left cerebellum on series 7, image 85 is new since 2023. No other convincing restricted diffusion. No midline shift, mass effect, evidence of mass lesion, ventriculomegaly. Cervicomedullary junction and pituitary are within normal limits. Patchy encephalomalacia in the left cerebellum PICA territory. Elsewhere stable gray and white matter signal compared to 2023. Vascular: Major intracranial vascular flow voids are stable since 2023 with occluded distal left vertebral artery (series 5, image 23). Skull and upper cervical spine: Visualized bone marrow signal is within normal limits. Partially visible cervical spine degeneration. Sinuses/Orbits: Stable. Chronic left maxillary sinus mucosal thickening and fluid level. Other: Stable mastoid aeration. Visible internal auditory structures appear normal. Negative visible scalp and face. IMPRESSION: 1. Positive for: - 1.5 cm acute lacunar infarct of the right thalamus. No hemorrhagic transformation there. No mass effect. - evidence of trace SAH left superior convexity (possible Heidelberg classification 3c: Subarachnoid hemorrhage.). Recommend repeat noncontrast Head CT to further evaluate. 2. No  intracranial mass effect. No other acute intracranial abnormality. Chronic distal left vertebral artery occlusion and patchy chronic left cerebellar infarcts. The finding of possible SAH and recommendation for Head CT were communicated to Dr. MAGDALENO Blower at 3:58 am on 11/25/2023 by text page via the [facility] messaging system. Electronically Signed   By: VEAR Hurst M.D.   On: 11/25/2023 03:58   ECHOCARDIOGRAM COMPLETE Result Date: 11/24/2023    ECHOCARDIOGRAM REPORT   Patient Name:   Daniel Herrera Date of Exam: 11/24/2023 Medical Rec #:  [id number]         Height:       73.0 in Accession #:    [id number]        Weight:       193.3 lb Date of Birth:  [birth year]         BSA:          2.121 m Patient Age:    83 years          BP:           137/75 mmHg Patient Gender: M                 HR:           61 bpm. Exam Location:  Inpatient Procedure: 2D Echo (Both Spectral and Color Flow Doppler were utilized during            procedure). Indications:    stroke  History:        Patient has prior history of Echocardiogram examinations, most  recent 06/15/2021. CAD, Prior CABG, chronic kidney disease; Risk                 Factors:Hypertension, Dyslipidemia and Diabetes.  Sonographer:    Tinnie Barefoot RDCS Referring Phys: [phone] MCNEILL P KIRKPATRICK IMPRESSIONS  1. Left ventricular ejection fraction, by estimation, is 60 to 65%. The left ventricle has normal function. The left ventricle has no regional wall motion abnormalities. There is mild left ventricular hypertrophy. Left ventricular diastolic parameters are consistent with Grade I diastolic dysfunction (impaired relaxation).  2. Right ventricular systolic function is mildly reduced. The right ventricular size is normal. There is normal pulmonary artery systolic pressure. The estimated right ventricular systolic pressure is 27.6 mmHg.  3. The mitral valve is grossly normal. Trivial mitral valve regurgitation.  4. The aortic valve is tricuspid. Aortic valve  regurgitation is not visualized.  5. The inferior vena cava is normal in size with greater than 50% respiratory variability, suggesting right atrial pressure of 3 mmHg. Comparison(s): Changes from prior study are noted. 06/15/2021: LVEF 55-60%. FINDINGS  Left Ventricle: Left ventricular ejection fraction, by estimation, is 60 to 65%. The left ventricle has normal function. The left ventricle has no regional wall motion abnormalities. The left ventricular internal cavity size was normal in size. There is  mild left ventricular hypertrophy. Left ventricular diastolic parameters are consistent with Grade I diastolic dysfunction (impaired relaxation). Indeterminate filling pressures. Right Ventricle: The right ventricular size is normal. No increase in right ventricular wall thickness. Right ventricular systolic function is mildly reduced. There is normal pulmonary artery systolic pressure. The tricuspid regurgitant velocity is 2.48 m/s, and with an assumed right atrial pressure of 3 mmHg, the estimated right ventricular systolic pressure is 27.6 mmHg. Left Atrium: Left atrial size was normal in size. Right Atrium: Right atrial size was normal in size. Pericardium: There is no evidence of pericardial effusion. Mitral Valve: The mitral valve is grossly normal. Trivial mitral valve regurgitation. Tricuspid Valve: The tricuspid valve is grossly normal. Tricuspid valve regurgitation is trivial. Aortic Valve: The aortic valve is tricuspid. Aortic valve regurgitation is not visualized. Pulmonic Valve: The pulmonic valve was grossly normal. Pulmonic valve regurgitation is trivial. Aorta: The aortic root and ascending aorta are structurally normal, with no evidence of dilitation. Venous: The inferior vena cava is normal in size with greater than 50% respiratory variability, suggesting right atrial pressure of 3 mmHg. IAS/Shunts: No atrial level shunt detected by color flow Doppler.  LEFT VENTRICLE PLAX 2D LVIDd:         5.20 cm    Diastology LVIDs:         3.30 cm   LV e' medial:    5.98 cm/s LV PW:         1.10 cm   LV E/e' medial:  12.7 LV IVS:        1.10 cm   LV e' lateral:   11.60 cm/s LVOT diam:     2.00 cm   LV E/e' lateral: 6.5 LV SV:         55 LV SV Index:   26 LVOT Area:     3.14 cm  RIGHT VENTRICLE            IVC RV Basal diam:  2.70 cm    IVC diam: 1.20 cm RV S prime:     8.16 cm/s TAPSE (M-mode): 1.0 cm LEFT ATRIUM             Index  RIGHT ATRIUM           Index LA diam:        3.80 cm 1.79 cm/m   RA Area:     15.80 cm LA Vol (A2C):   59.3 ml 27.96 ml/m  RA Volume:   38.40 ml  18.11 ml/m LA Vol (A4C):   53.5 ml 25.23 ml/m LA Biplane Vol: 60.4 ml 28.48 ml/m  AORTIC VALVE LVOT Vmax:   80.20 cm/s LVOT Vmean:  51.400 cm/s LVOT VTI:    0.175 m  AORTA Ao Root diam: 3.10 cm Ao Asc diam:  3.40 cm MITRAL VALVE               TRICUSPID VALVE MV Area (PHT): 3.51 cm    TR Peak grad:   24.6 mmHg MV Decel Time: 216 msec    TR Vmax:        248.00 cm/s MV E velocity: 75.80 cm/s MV A velocity: 80.50 cm/s  SHUNTS MV E/A ratio:  0.94        Systemic VTI:  0.18 m                            Systemic Diam: 2.00 cm Vinie Maxcy MD Electronically signed by Vinie Maxcy MD Signature Date/Time: 11/24/2023/1:58:34 PM    Final     Vitals:   11/25/23 0300 11/25/23 0400 11/25/23 0500 11/25/23 0600  BP:  (!) 156/78 (!) 153/82 (!) 149/72  Pulse: 75 71 66 63  Resp: 11 17 16 13   Temp:  (!) 97.5 F (36.4 C)    TempSrc:  Oral    SpO2: 95% 96% 98% 96%  Weight:      Height:         PHYSICAL EXAM General:  Alert, well-nourished, well-developed patient in no acute distress Psych:  Mood and affect appropriate for situation CV: Regular rate and rhythm on monitor Respiratory:  Regular, unlabored respirations on room air GI: Abdomen soft and nontender   NEURO:  Mental Status: AA&Ox3, patient is able to give clear and coherent history Speech/Language: very mild pharyngeal aphasia.  Naming, repetition, fluency, and comprehension  intact.  Cranial Nerves:  II: PERRL. Visual fields full.  III, IV, VI: EOMI. Eyelids elevate symmetrically.  V: Sensation is intact to light touch and symmetrical to face.  VII: Face is symmetrical resting and smiling VIII: hearing intact to voice. IX, X: Palate elevates symmetrically. Phonation is normal.  KP:Dynloizm shrug 5/5. XII: tongue is midline without fasciculations. Motor:  LUE 4/5 with drift  LLE 4/5 RUE and RLE with full strength  Tone: is normal and bulk is normal Sensation- Sensation diminished on the left upper and lower extremity  Coordination: LUE ataxia out of proportion to weakness Gait- unable to stand with walker  Most Recent NIH 3     ASSESSMENT/PLAN  Mr. Daniel Herrera is a 83 y.o. male with history of hereditary angioedema type II, PAF on ASA, HTN, HLD, CAD s/p CABG, MI, prior stroke, CHF, CKD, GERD, hx of GI bleed, history of prostate cancer, hypothyroidism, diabetes who presents to [facility], ED via private vehicle for acute onset of left-sided weakness.  NIH on Admission 9  Acute Ischemic Infarct:  Suspect right hemispheric infarct s/p TNK  Etiology:  likely hypertensive vs intracranial atherosclerosis though hx of stress induced Afib post CABG Code Stroke CT head No acute abnormality.  CTA head & neck Severe left and moderate to  severe right P2 PCA stenosis. Severe right M2 MCA stenosis. Moderate left A2 ACA stenosis. Chronically occluded left vertebral artery.  MRI  1.5 cm acute lacunar infarct of the right thalamus. No hemorrhagic transformation there. No mass effect. Evidence of trace SAH left superior convexity but on my personal review, this may also represent cortical smal hemorrhage iso TNK Repeat CT Head- Subtle but confirmed trace left vertex subarachnoid hemorrhage (sagittal image 43). No intracranial mass effect or other complicating features. Right thalamic infarct stable from MRI yesterday. 2D Echo EF 60-65% LDL 106 HgbA1c 5.6 VTE  prophylaxis - SCDs aspirin  81 mg daily prior to admission, now on aspirin  81mg  (prior bleeding on plavix ) Therapy recommendations:  Pending Disposition: ICU  Hx of Stroke/TIA 06/15/2021- Left PICA infarct likely secondary left vertebral occlusion at the left V2  Discharged with plan for ASA 325 abd plavix  75mg  for 3 months and then plavix  alone. ASA decreased to 81mg  due to ecchymosis and stopped plavix  in 2023 due to bruising, he did continue the ASA 81mg  Pitavastatin  4mg  and zetia  10mg , pt stated intolerance with other statins (lipitor , crestor , pravastatin ). EF 55-60%. Follows with Dr. Edwyna at [facility]  Post op Paroxysmal Atrial fibrillation after CABG Continue telemetry monitoring Hx lower GI bleed (hemorrhoids and r/t cancer treatment) No hx of anticoagulation  Long term monitor in 2024- Patient had a min HR of 47 bpm, max HR of 130 bpm, and avg HR of 65 bpm.  Predominant underlying rhythm was Sinus Rhythm.  First Degree AV Block was present.  17 Supraventricular Tachycardia runs occurred, the run with the fastest interval lasting 7 beats with a max rate of 130 bpm, the longest lasting 16 beats with an avg rate of 111 bpm. Some episodes of Supraventricular Tachycardia may be possible Atrial Tachycardia with variable block. Isolated SVEs were rare (<1.0%), SVE Couplets were rare (<1.0%), and SVE Triplets were rare (<1.0%).  Isolated VEs were occasional (1.2%, 12241), VE Couplets were rare (<1.0%, 36), and no VE Triplets were present. Ventricular Bigeminy was present. Inverted QRS complexes possibly due to inverted placement of device.  CHF Hypertension Home meds:  Norvasc , losartan   Blood Pressure Goal: BP less than 180/105  Patient notes hypertension always worst in morning. STOPBANG score at least 5 therefore high risk of OSA -outpatient sleep study at discharge  CAD s/p stenting  Hyperlipidemia Home meds:  Pitavastatin  Add zetia  back  LDL 106, goal < 70 Has  previously documented intolerances to other statins (lipitor , crestor , pravastatin )  Dysphagia Patient has post-stroke dysphagia, SLP consulted    Diet   Diet Heart Room service appropriate? Yes; Fluid consistency: Thin   Advance diet as tolerated  Other Stroke Risk Factors Coronary artery disease s/p CABG CHF  Other Active Problems Hereditary angioedema - patient medication is in pharmacy fridge  CKD Cr 1.80, GFR 36 - appears stable  GERD Hypothyroidism Hx of LGIB, was told to related to prostate cancer radiation Hx of hematuria, was told to related to prostate cancer radiation Prostate cancer status post radiation  Hospital day # 2   Patient seen and examined by NP/APP with MD. MD to update note as needed.   Jorene Last, DNP, FNP-BC [facility] Pager: [phone]  Attending Neurohospitalist Addendum Patient seen and examined with APP/Resident. Agree with the history and physical as documented above. Agree with the plan as documented, which I helped formulate. I have independently reviewed the chart, obtained history, review of systems and examined the patient.I have personally reviewed  pertinent head/neck/spine imaging (CT/MRI). Please feel free to call with any questions.  Sarajane Daring, MD [facility]    To contact Stroke Continuity provider, please refer to [email or web address]. After hours, contact General Neurology

## 2023-11-25 NOTE — Evaluation (Signed)
 Occupational Therapy Evaluation Patient Details Name: Daniel Herrera MRN: [id number] DOB: [birth year] Today's Date: 11/25/2023   History of Present Illness   Pt is 83 yo male who presents on 11/23/23 with c/o L sided weakness and tingling. Pt received TNK 1843.  PMH: CVA, CAD, CABG, pAfib, prostate cancer, DM2, HTN, HLD, interstitial lung disease, hereditary angioedema, R foot injury     Clinical Impressions Patient admitted for the diagnosis above.  PTA he lives at home with his spouse who can provided needed 24 hour supportive assist.  Patient presents with the deficits listed below.  Currently needing Mod A for basic mobility/toileting and up to Max A for lower body ADL sit to stand.  OT will follow in the acute setting to address deficits, and Patient will benefit from intensive inpatient follow-up therapy, >3 hours/day.     If plan is discharge home, recommend the following:   Assist for transportation;Assistance with cooking/housework;A lot of help with walking and/or transfers;A lot of help with bathing/dressing/bathroom     Functional Status Assessment   Patient has had a recent decline in their functional status and demonstrates the ability to make significant improvements in function in a reasonable and predictable amount of time.     Equipment Recommendations   None recommended by OT     Recommendations for Other Services         Precautions/Restrictions   Precautions Precautions: Fall Restrictions Weight Bearing Restrictions Per Provider Order: No     Mobility Bed Mobility Overal bed mobility: Needs Assistance Bed Mobility: Supine to Sit     Supine to sit: Min assist, HOB elevated          Transfers Overall transfer level: Needs assistance Equipment used: Rolling walker (2 wheels) Transfers: Sit to/from Stand, Bed to chair/wheelchair/BSC Sit to Stand: Min assist     Step pivot transfers: Mod assist            Balance Overall  balance assessment: Needs assistance Sitting-balance support: Single extremity supported, Feet supported Sitting balance-Leahy Scale: Fair   Postural control: Left lateral lean Standing balance support: Bilateral upper extremity supported, During functional activity Standing balance-Leahy Scale: Poor                             ADL either performed or assessed with clinical judgement   ADL Overall ADL's : Needs assistance/impaired Eating/Feeding: Minimal assistance;Sitting   Grooming: Wash/dry hands;Minimal assistance;Standing   Upper Body Bathing: Moderate assistance;Sitting   Lower Body Bathing: Maximal assistance;Sit to/from stand   Upper Body Dressing : Moderate assistance;Sitting   Lower Body Dressing: Maximal assistance;Sit to/from stand   Toilet Transfer: Moderate assistance;Ambulation;Rolling walker (2 wheels)                   Vision Baseline Vision/History: 1 Wears glasses Patient Visual Report: No change from baseline       Perception Perception: Not tested       Praxis Praxis: Not tested       Pertinent Vitals/Pain Pain Assessment Pain Assessment: No/denies pain     Extremity/Trunk Assessment Upper Extremity Assessment Upper Extremity Assessment: Right hand dominant;LUE deficits/detail LUE Deficits / Details: functional strength, poor coordination LUE Sensation: decreased light touch LUE Coordination: decreased fine motor;decreased gross motor   Lower Extremity Assessment Lower Extremity Assessment: Defer to PT evaluation   Cervical / Trunk Assessment Cervical / Trunk Assessment: Normal   Communication Communication Communication: Impaired Factors Affecting Communication:  Hearing impaired;Reduced clarity of speech   Cognition Arousal: Alert Behavior During Therapy: WFL for tasks assessed/performed Cognition: Cognition impaired       Memory impairment (select all impairments): Short-term memory, Working memory Attention  impairment (select first level of impairment): Sustained attention Executive functioning impairment (select all impairments): Organization, Sequencing, Reasoning, Problem solving                   Following commands: Impaired Following commands impaired: Follows one step commands inconsistently, Follows one step commands with increased time     Cueing  General Comments   Cueing Techniques: Verbal cues;Gestural cues;Tactile cues   VSS on RA   Exercises     Shoulder Instructions      Home Living Family/patient expects to be discharged to:: Private residence Living Arrangements: Spouse/significant other Available Help at Discharge: Family;Available 24 hours/day Type of Home: House Home Access: Stairs to enter;Ramped entrance     Home Layout: One level     Bathroom Shower/Tub: Producer, television/film/video: Handicapped height Bathroom Accessibility: Yes   Home Equipment: Agricultural consultant (2 wheels);Shower seat;Grab bars - toilet;Grab bars - tub/shower          Prior Functioning/Environment Prior Level of Function : Independent/Modified Independent             Mobility Comments: was not using AD PTA ADLs Comments: independent    OT Problem List: Decreased strength;Decreased activity tolerance;Impaired balance (sitting and/or standing);Decreased safety awareness;Decreased knowledge of precautions;Impaired UE functional use;Decreased knowledge of use of DME or AE   OT Treatment/Interventions: Self-care/ADL training;Therapeutic activities;Patient/family education;DME and/or AE instruction;Balance training      OT Goals(Current goals can be found in the care plan section)   Acute Rehab OT Goals Patient Stated Goal: Return home OT Goal Formulation: With patient Time For Goal Achievement: 12/10/23 Potential to Achieve Goals: Good ADL Goals Pt Will Perform Grooming: with supervision;standing Pt Will Perform Upper Body Dressing: with  supervision;sitting Pt Will Perform Lower Body Dressing: with supervision;sit to/from stand Pt Will Transfer to Toilet: with supervision;regular height toilet;ambulating   OT Frequency:  Min 2X/week    Co-evaluation              AM-PAC OT 6 Clicks Daily Activity     Outcome Measure Help from another person eating meals?: A Little Help from another person taking care of personal grooming?: A Little Help from another person toileting, which includes using toliet, bedpan, or urinal?: A Lot Help from another person bathing (including washing, rinsing, drying)?: A Lot Help from another person to put on and taking off regular upper body clothing?: A Lot Help from another person to put on and taking off regular lower body clothing?: A Lot 6 Click Score: 14   End of Session Equipment Utilized During Treatment: Gait belt;Rolling walker (2 wheels) Nurse Communication: Mobility status  Activity Tolerance: Patient tolerated treatment well Patient left: in chair;with call bell/phone within reach;with chair alarm set  OT Visit Diagnosis: Unsteadiness on feet (R26.81);Other abnormalities of gait and mobility (R26.89);Muscle weakness (generalized) (M62.81);Hemiplegia and hemiparesis Hemiplegia - Right/Left: Left Hemiplegia - dominant/non-dominant: Non-Dominant Hemiplegia - caused by: Cerebral infarction                Time: 0823-0850 OT Time Calculation (min): 27 min Charges:  OT General Charges $OT Visit: 1 Visit OT Evaluation $OT Eval Moderate Complexity: 1 Mod OT Treatments $Self Care/Home Management : 8-22 mins  11/25/2023  RP, OTR/L  Acute Rehabilitation Services  Office:  [phone]   Charlie JONETTA Halsted 11/25/2023, 8:56 AM

## 2023-11-25 NOTE — Plan of Care (Signed)
 Received from [facility] ICU into room.  Wife at daughter accompanied.  Oriented to room and surroundings.   Problem: Education: Goal: Knowledge of disease or condition will improve Outcome: Progressing   Problem: Education: Goal: Knowledge of secondary prevention will improve (MUST DOCUMENT ALL) Outcome: Progressing   Problem: Education: Goal: Knowledge of patient specific risk factors will improve (DELETE if not current risk factor) Outcome: Progressing   Problem: Activity: Goal: Risk for activity intolerance will decrease Outcome: Progressing   Problem: Nutrition: Goal: Adequate nutrition will be maintained Outcome: Progressing   Problem: Coping: Goal: Level of anxiety will decrease Outcome: Progressing

## 2023-11-26 ENCOUNTER — Other Ambulatory Visit

## 2023-11-26 DIAGNOSIS — I7090 Unspecified atherosclerosis: Secondary | ICD-10-CM | POA: Diagnosis not present

## 2023-11-26 DIAGNOSIS — I6381 Other cerebral infarction due to occlusion or stenosis of small artery: Secondary | ICD-10-CM

## 2023-11-26 DIAGNOSIS — I10 Essential (primary) hypertension: Secondary | ICD-10-CM

## 2023-11-26 DIAGNOSIS — I69391 Dysphagia following cerebral infarction: Secondary | ICD-10-CM

## 2023-11-26 DIAGNOSIS — R29703 NIHSS score 3: Secondary | ICD-10-CM | POA: Diagnosis not present

## 2023-11-26 DIAGNOSIS — Z8679 Personal history of other diseases of the circulatory system: Secondary | ICD-10-CM

## 2023-11-26 LAB — GLUCOSE, CAPILLARY
Glucose-Capillary: 127 mg/dL — ABNORMAL HIGH (ref 70–99)
Glucose-Capillary: 256 mg/dL — ABNORMAL HIGH (ref 70–99)
Glucose-Capillary: 140 mg/dL — ABNORMAL HIGH (ref 70–99)
Glucose-Capillary: 162 mg/dL — ABNORMAL HIGH (ref 70–99)

## 2023-11-26 MED ORDER — PITAVASTATIN CALCIUM 4 MG PO TABS
4.0000 mg | ORAL_TABLET | Freq: Every day | ORAL | Status: DC
  Administered 2023-11-26 – 2023-11-27 (×2): 4 mg via ORAL
  Filled 2023-11-26 (×4): qty 1

## 2023-11-26 NOTE — Progress Notes (Signed)
 Home med --Pitavastatin  4mg  tabs in from home, and will be taken down to the main pharmacy. Pharmacist states plan per neurology that pt will start taking upon discharge to CIR.   Jadamarie Butson,RN SWOT

## 2023-11-26 NOTE — Plan of Care (Signed)
 No more episodes of swelling.  Sitting up in the bed talking with family on the phone.  In good spirits laughing and joking.  No resp distress.  No complaints.   Problem: Ischemic Stroke/TIA Tissue Perfusion: Goal: Complications of ischemic stroke/TIA will be minimized Outcome: Progressing   Problem: Self-Care: Goal: Ability to participate in self-care as condition permits will improve Outcome: Progressing   Problem: Nutrition: Goal: Risk of aspiration will decrease Outcome: Progressing   Problem: Coping: Goal: Will verbalize positive feelings about self Outcome: Progressing

## 2023-11-26 NOTE — Progress Notes (Signed)
 IP rehab admissions - I received a phone call from patient while on my drive home.  Patient now wants to admit to CIR tomorrow am.  I will let my partner know of decision by patient.  We should have a bed for him tomorrow.  [phone]

## 2023-11-26 NOTE — TOC CAGE-AID Note (Signed)
 Transition of Care [facility]) - CAGE-AID Screening   Patient Details  Name: Daniel Herrera MRN: [id number] Date of Birth: Oct 27, 1940  [facility] CM/SW Contact:    [name], LCSW Phone Number: 11/26/2023, 10:14 AM   Clinical Narrative: No SA noted.   CAGE-AID Screening:    Have You Ever Felt You Ought to Cut Down on Your Drinking or Drug Use?: No Have People Annoyed You By Critizing Your Drinking Or Drug Use?: No Have You Felt Bad Or Guilty About Your Drinking Or Drug Use?: No Have You Ever Had a Drink or Used Drugs First Thing In The Morning to Steady Your Nerves or to Get Rid of a Hangover?: No CAGE-AID Score: 0  Substance Abuse Education Offered: No

## 2023-11-26 NOTE — Progress Notes (Signed)
 Call to the room by patient. Patient states, My throat feels like it is swelling.  Patient's tongue looks enlarged. Vitals signs obtained and SQ injection Firazyr  30 mg self administered by patient. NP came to bedside.  Patient monitored.

## 2023-11-26 NOTE — Progress Notes (Signed)
 Physical Therapy Treatment Patient Details Name: Daniel Herrera MRN: [id number] DOB: 1940/10/11 Today's Date: 11/26/2023   History of Present Illness Pt is 83 yo male who presents on 11/23/23 with c/o L sided weakness and tingling. Pt received TNK 1843.  PMH: CVA, CAD, CABG, pAfib, prostate cancer, DM2, HTN, HLD, interstitial lung disease, hereditary angioedema, R foot injury    PT Comments  Pt in bed and eager to participate in PT. Pt continues to present with L sided incoordination and functional weakness, most evident during gait training. Pt tolerating repeated short-distance gait in room, overall pt requiring min physical assist and max cuing for LLE sequencing and coordination. Pt tolerated strengthening and coordination exercises well. Plan remains appropriate.     If plan is discharge home, recommend the following: A lot of help with walking and/or transfers;A little help with bathing/dressing/bathroom;Assistance with cooking/housework;Assist for transportation;Help with stairs or ramp for entrance   Can travel by private vehicle        Equipment Recommendations  None recommended by PT    Recommendations for Other Services OT consult;Rehab consult     Precautions / Restrictions Precautions Precautions: Fall Restrictions Weight Bearing Restrictions Per Provider Order: No     Mobility  Bed Mobility Overal bed mobility: Needs Assistance Bed Mobility: Supine to Sit     Supine to sit: Supervision, HOB elevated, Used rails          Transfers Overall transfer level: Needs assistance Equipment used: Rolling walker (2 wheels) Transfers: Sit to/from Stand Sit to Stand: Min assist           General transfer comment: light rise and steady assist, cues for hand placement when rising and sitting. sit<>stand x5 throughout session    Ambulation/Gait Ambulation/Gait assistance: Min assist Gait Distance (Feet): 30 Feet (+15+15 to and from bathroom) Assistive device:  Rolling walker (2 wheels) Gait Pattern/deviations: Step-to pattern, Decreased step length - left, Decreased dorsiflexion - left, Ataxic, Narrow base of support Gait velocity: decr     General Gait Details: assist for steadying pt and RW, RW management, max cues for heel strike and widening BOS with LLE   Stairs             Wheelchair Mobility     Tilt Bed    Modified Rankin (Stroke Patients Only) Modified Rankin (Stroke Patients Only) Pre-Morbid Rankin Score: No symptoms Modified Rankin: Moderately severe disability     Balance Overall balance assessment: Needs assistance Sitting-balance support: Single extremity supported, Feet supported Sitting balance-Leahy Scale: Fair Sitting balance - Comments: L lateral bias in sitting, tactile cuing to correct Postural control: Left lateral lean Standing balance support: Bilateral upper extremity supported, During functional activity Standing balance-Leahy Scale: Poor Standing balance comment: L lateral lean, min A to maintain standing                            Communication Communication Communication: Impaired Factors Affecting Communication: Hearing impaired;Reduced clarity of speech  Cognition Arousal: Alert Behavior During Therapy: WFL for tasks assessed/performed   PT - Cognitive impairments: Attention, Problem solving, Safety/Judgement                       PT - Cognition Comments: repeated cuing needed for LLE management and safety Following commands: Impaired Following commands impaired: Follows one step commands with increased time    Cueing Cueing Techniques: Verbal cues, Gestural cues, Tactile cues  Exercises Other Exercises  Other Exercises: standing marches LLE x15, anterior toe taps LLE x10, lateral toe taps LLE x10    General Comments        Pertinent Vitals/Pain Pain Assessment Pain Assessment: No/denies pain Pain Location: L side neck Pain Descriptors / Indicators:  Tightness Pain Intervention(s): Limited activity within patient's tolerance, Monitored during session, Repositioned    Home Living                          Prior Function            PT Goals (current goals can now be found in the care plan section) Acute Rehab PT Goals Patient Stated Goal: return home PT Goal Formulation: With patient/family Time For Goal Achievement: 12/08/23 Potential to Achieve Goals: Good Progress towards PT goals: Progressing toward goals    Frequency    Min 2X/week      PT Plan      Co-evaluation              AM-PAC PT 6 Clicks Mobility   Outcome Measure  Help needed turning from your back to your side while in a flat bed without using bedrails?: A Little Help needed moving from lying on your back to sitting on the side of a flat bed without using bedrails?: A Little Help needed moving to and from a bed to a chair (including a wheelchair)?: A Lot (mod-max cues) Help needed standing up from a chair using your arms (e.g., wheelchair or bedside chair)?: A Little Help needed to walk in hospital room?: A Lot (mod-max cues) Help needed climbing 3-5 steps with a railing? : A Lot 6 Click Score: 15    End of Session Equipment Utilized During Treatment: Gait belt Activity Tolerance: Patient tolerated treatment well Patient left: in bed;with call bell/phone within reach;with bed alarm set;with family/visitor present Nurse Communication: Mobility status PT Visit Diagnosis: Unsteadiness on feet (R26.81);Hemiplegia and hemiparesis;Pain;Difficulty in walking, not elsewhere classified (R26.2) Hemiplegia - Right/Left: Left Hemiplegia - dominant/non-dominant: Non-dominant Hemiplegia - caused by: Cerebral infarction Pain - Right/Left: Left     Time: 9083-9052 PT Time Calculation (min) (ACUTE ONLY): 31 min  Charges:    $Gait Training: 8-22 mins $Therapeutic Exercise: 8-22 mins PT General Charges $$ ACUTE PT VISIT: 1 Visit                      Johana RAMAN, PT DPT Acute Rehabilitation Services Secure Chat Preferred  Office [phone]    Jayda White E Stroup 11/26/2023, 11:10 AM

## 2023-11-26 NOTE — Progress Notes (Addendum)
 STROKE TEAM PROGRESS NOTE    SIGNIFICANT HOSPITAL EVENTS 8/22- TNK given, admit to 4NICU 8/23 - stable, improving exam 8/25 -out of ICU  INTERIM HISTORY/SUBJECTIVE Patient remains hemodynamically stable and afebrile overnight.  He continues to report improvement in the function of his left hand.  He is awaiting placement in rehab.  OBJECTIVE  CBC    Component Value Date/Time   WBC 5.8 11/24/2023 1146   RBC 4.01 (L) 11/24/2023 1146   HGB 12.7 (L) 11/24/2023 1146   HGB 15.4 09/12/2017 1034   HCT 37.4 (L) 11/24/2023 1146   HCT 46.3 09/12/2017 1034   PLT 170 11/24/2023 1146   PLT 172 09/12/2017 1034   MCV 93.3 11/24/2023 1146   MCV 96 09/12/2017 1034   MCH 31.7 11/24/2023 1146   MCHC 34.0 11/24/2023 1146   RDW 12.5 11/24/2023 1146   RDW 14.0 09/12/2017 1034   LYMPHSABS 1.2 11/23/2023 1814   LYMPHSABS 1.0 09/12/2017 1034   MONOABS 0.6 11/23/2023 1814   EOSABS 0.1 11/23/2023 1814   EOSABS 0.1 09/12/2017 1034   BASOSABS 0.1 11/23/2023 1814   BASOSABS 0.0 09/12/2017 1034    BMET    Component Value Date/Time   NA 138 11/24/2023 1146   K 4.3 11/24/2023 1146   CL 104 11/24/2023 1146   CO2 25 11/24/2023 1146   GLUCOSE 123 (H) 11/24/2023 1146   BUN 19 11/24/2023 1146   CREATININE 1.80 (H) 11/24/2023 1146   CREATININE 1.29 (H) 04/02/2015 1146   CALCIUM  9.4 11/24/2023 1146   EGFR 42.0 06/30/2023 1223   EGFR 44 05/20/2022 1443   GFRNONAA 36 (L) 11/24/2023 1146    IMAGING past 24 hours No results found.   Vitals:   11/25/23 1558 11/25/23 2323 11/26/23 0405 11/26/23 0729  BP: (!) 151/69 130/62 126/74 137/75  Pulse: 61 65 61 67  Resp: 18 18 (!) 8 16  Temp: (!) 97.5 F (36.4 C) 99.1 F (37.3 C) 98.3 F (36.8 C) 97.6 F (36.4 C)  TempSrc: Oral Oral Oral Oral  SpO2: 100% 97% 98% 99%  Weight:      Height:         PHYSICAL EXAM General:  Alert, well-nourished, well-developed patient in no acute distress Psych:  Mood and affect appropriate for situation CV:  Regular rate and rhythm on monitor Respiratory:  Regular, unlabored respirations on room air   NEURO:  Mental Status: AA&Ox3, patient is able to give clear and coherent history Speech/Language: Slow hesitant speech..  Naming, repetition, fluency, and comprehension intact.  Cranial Nerves:  II: PERRL. Visual fields full.  III, IV, VI: EOMI. Eyelids elevate symmetrically.  V: Sensation is intact to light touch and diminished on the left VII: Face is symmetrical resting and smiling VIII: hearing intact to voice. IX, X: Palate elevates symmetrically. Phonation is normal.  KP:Dynloizm shrug 5/5. XII: tongue is midline without fasciculations. Motor:  LUE 4/5 with drift, diminished fine finger movements on the left and right arm orbits left LLE 4/5 RUE and RLE with full strength  Tone: is normal and bulk is normal Sensation- Sensation diminished on the left upper and lower extremity  Coordination: LUE ataxia out of proportion to weakness Gait-deferred  Most Recent NIH 3      ASSESSMENT/PLAN  Mr. Daniel Herrera is a 83 y.o. male with history of hereditary angioedema type II, PAF on ASA, HTN, HLD, CAD s/p CABG, MI, prior stroke, CHF, CKD, GERD, hx of GI bleed, history of prostate cancer, hypothyroidism, diabetes who  presents to [facility] ED via private vehicle for acute onset of left-sided weakness.  He received TNK and has been experiencing improvement of his left-sided weakness.  He has been transferred out of the ICU and is awaiting rehab placement.  NIH on Admission 9  Acute Ischemic Infarct:  right hemispheric infarct s/p TNK  Etiology:  likely hypertensive vs intracranial atherosclerosis though hx of stress induced Afib post CABG Code Stroke CT head No acute abnormality.  CTA head & neck Severe left and moderate to severe right P2 PCA stenosis. Severe right M2 MCA stenosis. Moderate left A2 ACA stenosis. Chronically occluded left vertebral artery.  MRI  1.5 cm acute lacunar  infarct of the right thalamus. No hemorrhagic transformation there. No mass effect. Evidence of trace SAH left superior convexity but on my personal review, this may also represent cortical smal hemorrhage iso TNK Repeat CT Head- Subtle but confirmed trace left vertex subarachnoid hemorrhage (sagittal image 43). No intracranial mass effect or other complicating features. Right thalamic infarct stable from MRI yesterday. 2D Echo EF 60-65% LDL 106 HgbA1c 5.6 VTE prophylaxis - SCDs aspirin  81 mg daily prior to admission, now on aspirin  81mg  (prior bleeding on plavix ) Therapy recommendations:  Pending Disposition: ICU  Hx of Stroke/TIA 06/15/2021- Left PICA infarct likely secondary left vertebral occlusion at the left V2  Discharged with plan for ASA 325 abd plavix  75mg  for 3 months and then plavix  alone. ASA decreased to 81mg  due to ecchymosis and stopped plavix  in 2023 due to bruising, he did continue the ASA 81mg  Pitavastatin  4mg  and zetia  10mg , pt stated intolerance with other statins (lipitor , crestor , pravastatin ). EF 55-60%. Follows with [facility] Edwyna [facility]  Post op Paroxysmal Atrial fibrillation after CABG Continue telemetry monitoring Hx lower GI bleed (hemorrhoids and r/t cancer treatment) No hx of anticoagulation  Long term monitor in 2024- Patient had a min HR of 47 bpm, max HR of 130 bpm, and avg HR of 65 bpm.  Predominant underlying rhythm was Sinus Rhythm.  First Degree AV Block was present.  17 Supraventricular Tachycardia runs occurred, the run with the fastest interval lasting 7 beats with a max rate of 130 bpm, the longest lasting 16 beats with an avg rate of 111 bpm. Some episodes of Supraventricular Tachycardia may be possible Atrial Tachycardia with variable block. Isolated SVEs were rare (<1.0%), SVE Couplets were rare (<1.0%), and SVE Triplets were rare (<1.0%).  Isolated VEs were occasional (1.2%, 12241), VE Couplets were rare (<1.0%, 36), and no VE Triplets  were present. Ventricular Bigeminy was present. Inverted QRS complexes possibly due to inverted placement of device.  CHF Hypertension Home meds:  Norvasc , losartan   Blood Pressure Goal: BP less than 180/105  Patient notes hypertension always worst in morning. STOPBANG score at least 5 therefore high risk of OSA -outpatient sleep study at discharge  CAD s/p stenting  Hyperlipidemia Home meds:  Pitavastatin  (nonformulary but will resume on discharge) Add zetia  back  LDL 106, goal < 70 Has previously documented intolerances to other statins (lipitor , crestor , pravastatin ) Consider Leqvio or PCSK9 inhibitor  Dysphagia Patient has post-stroke dysphagia, SLP consulted    Diet   Diet Heart Room service appropriate? Yes; Fluid consistency: Thin   Advance diet as tolerated  Other Stroke Risk Factors Coronary artery disease s/p CABG CHF  Other Active Problems Hereditary angioedema - patient medication is in pharmacy fridge  CKD Cr 1.80, GFR 36 - appears stable  GERD Hypothyroidism Hx of LGIB, was told to  related to prostate cancer radiation Hx of hematuria, was told to related to prostate cancer radiation Prostate cancer status post radiation  Hospital day # 3   Patient seen and examined by NP/APP with MD. MD to update note as needed.   Cortney E Everitt Clint Kill , MSN, AGACNP-BC [facility] See Amion for schedule and pager information 11/26/2023 10:52 AM   I have personally obtained history,examined this patient, reviewed notes, independently viewed imaging studies, participated in medical decision making and plan of care.ROS completed by me personally and pertinent positives fully documented  I have made any additions or clarifications directly to the above note. Agree with note above.  Patient presented with left-sided weakness secondary to right thalamic infarct likely from combination of small vessel disease and large vessel intracranial atherosclerosis.SABRA  He does  have a history of transient postop paroxysmal A-fib.  He developed worsening of his atrial angioedema today requiring usage of his home medication which provided instant relief.  Continue aspirin  alone for stroke prevention due to history of bruising on Plavix  in the past.  Continue ongoing stroke workup.  Aggressive risk factor modification. Patient also appears to be at risk for sleep apnea and may benefit with consideration for participation in the sleep apnea stroke prevention study.  He was given written information to review and decide.  It was made clear study participation voluntary and he can opt out of the study at any point in the future if not satisfied.  No study specific procedures were done before patient signed informed consent.   I personally spent a total of 50 minutes in the care of the patient today including getting/reviewing separately obtained history, performing a medically appropriate exam/evaluation, counseling and educating, placing orders, referring and communicating with other health care professionals, documenting clinical information in the EHR, independently interpreting results, and coordinating care.         Eather Popp, MD Medical Director [facility] Pager: [phone] 11/26/2023 4:10 PM  To contact Stroke Continuity provider, please refer to [email or web address]. After hours, contact General Neurology

## 2023-11-26 NOTE — Progress Notes (Signed)
 Sitting up in the bed resting comfortably.  Denies pain or discomfort.  No swallowing problems.  Daniel Herrera he feels great now.  Tongue no longer enlarged.

## 2023-11-26 NOTE — PMR Pre-admission (Signed)
PMR Admission Coordinator Pre-Admission Assessment  Patient: Daniel Herrera is an 83 y.o., male MRN: [id number] DOB: 1940-06-07 Height: 6' 1 (185.4 cm) Weight: 87.7 kg  Insurance Information HMO:     PPO:      PCP:      IPA:      80/20:      OTHER:  PRIMARY: Medicare part A and B      Policy#: [id number]      Subscriber: self CM Name:       Phone#:      Fax#:  Pre-Cert#:       Employer: Retired Benefits:  Phone #:       Name: Checked in passport one source [employer]. Date: 07/03/2003      Deduct: $1676      Out of Pocket Max: none      Life Max: n/a CIR: 100%      SNF: 100 days Outpatient: 80%     Co-Pay: 20% Home Health: 100%      Co-Pay: none DME: 80%     Co-Pay: 20% Providers: patient's choice  SECONDARY:       Policy#:      Phone#:   Artist:       Phone#:   The Engineer, materials Information Summary" for patients in Inpatient Rehabilitation Facilities with attached "Privacy Act Statement-Health Care Records" was provided and verbally reviewed with: Patient  Emergency Contact Information Contact Information     Name Relation Home Work Mobile   Scotten,Renita Daughter [phone]  [phone]   Landing,Annie Spouse [phone]        Other Contacts   None on File     Current Medical History  Patient Admitting Diagnosis: R CVA, SAH  History of Present Illness: An 83 y.o. male with hx of history of hereditary angioedema type II, PAF on ASA, HTN, HLD, CAD s/p CABG, MI, prior stroke, CHF, CKD, GERD, hx of GI bleed, history of prostate cancer, hypothyroidism, diabetes who presents to [facility] ED via private vehicle for acute onset of left-sided weakness.  Last known well 1515.  Patient states he last had a sandwich around 3:00, then was sitting in the chair and was unable to get up to the chair to go to the bathroom and complained of left leg numbness and inability to use his left leg.  He did experience a fall due to left leg weakness.  Family drove him to  the emergency room at [facility] on 11/23/23.  NIH 9 for left facial droop, left arm and leg weakness, ataxia in both left arm and leg, decreased sensation on the left, dysarthria.  CT head with no acute process.  CT angio head and neck negative for LVO.  Patient was deemed an appropriate candidate for TNK and TNK was administered.  MRI positive for: - 1.5 cm acute lacunar infarct of the right thalamus. No hemorrhagic transformation there. No mass effect.- evidence of trace SAH left superior convexity (possible Heidelberg classification 3c: Subarachnoid hemorrhage.  PT/OT evaluations completed with recommendations for acute inpatient rehab admission.  Complete NIHSS TOTAL: 3  Patient's medical [facility] from [facility] has been reviewed by the rehabilitation admission coordinator and physician.  Past Medical History  Past Medical History:  Diagnosis Date   Abnormal EKG 02/21/2015   Acute lower GI bleeding 05/28/2019   Anginal pain (HCC) 2016   Angio-edema    Anxiety    Arthritis    CAD S/P percutaneous coronary angioplasty  NSTEMI treated with LAD PCI with DES x 03 Feb 2015 Myoview low risk Nov 2017   Cataract    Cerebellar stroke (HCC) 06/14/2021   CHF (congestive heart failure) (HCC) 2020   after surgery only   Chronic diarrhea of unknown origin    CKD (chronic kidney disease) stage 3, GFR 30-59 ml/min 12/21/2017   Colitis 05/23/2015   Coronary artery disease 05/28/2019   Diverticular hemorrhage    Diverticulosis    Dyslipidemia, goal LDL below 70 02/21/2015   hyperlipidemia    Dysrhythmia    A-fib   Enterotoxigenic Escherichia coli infection 05/24/2015   Essential hypertension 02/21/2015   Geographic tongue    GERD (gastroesophageal reflux disease)    GERD without esophagitis 06/15/2021   GI bleed 12/21/2017   Glossitis, benign migratory 12/04/2018   Gout    Hematochezia 12/21/2017   Admitted Sept 2019 with lower GI bleeding felt to be secondary to  hemorrhoids   Hereditary angioedema type 2 (HCC) 07/28/2017   High cholesterol    History of kidney stones    History of non-ST elevation myocardial infarction (NSTEMI) 02/22/2015   Nov 2016   History of prolonged Q-T interval on ECG    History of prostate cancer    Hoarseness 12/04/2018   Hx of Clostridium difficile infection    Hx of umbilical hernia repair    Hypertension    Hypothyroidism    Hypothyroidism, unspecified 06/15/2021   ILD (interstitial lung disease) (HCC) 12/17/2013   ?related to chicken farm exposure or aspiration pneumonitis from GERD Previously attributed to acute interstitial pneumonitis but based on HRCT this is likely IPF, probable UIP   Lower GI bleed 06/03/2019   Malignant neoplasm of prostate (HCC) 08/07/2014   Myocardial infarction [facility]) 2016   NSTEMI (non-ST elevated myocardial infarction) (HCC) 05/21/2018   Open metatarsal fracture 09/02/2020   PAF (paroxysmal atrial fibrillation) (HCC) 06/13/2018   Post CABG- discharged on Amiodarone , he will probably not need this long term   Palpitations 03/15/2015   palpitations    Peripheral vascular disease (HCC)    vericose veins   Personal history of digestive disease    gastric ulcer   Pre-diabetes    Pre-operative clearance 01/02/2018   Prostate cancer (HCC)    Prostate enlargement    S/P CABG x 3 05/28/2018    LIMA-LAD, RIMA-RCA, and Lt radial to OM.   Skin cancer    basal cell carcinoma   STEC (Shiga toxin-producing Escherichia coli) infection 05/24/2015   Stroke (cerebrum) (HCC) 06/15/2021   Tachycardia determined by examination of pulse    Thyroid  disease    Type 2 diabetes mellitus with unspecified complications (HCC)    Type 2 diabetes mellitus without complications (HCC) 06/15/2021   Urticaria     Has the patient had major surgery during 100 days prior to admission? No  Family History   family history includes Cancer in his father, mother, and sister; Stomach cancer in his  mother.  Current Medications  Current Facility-Administered Medications:    amLODipine  (NORVASC ) tablet 2.5 mg, 2.5 mg, Oral, Daily, Shafer, Devon, NP, 2.5 mg at 11/27/23 1003   artificial tears ophthalmic solution 1 drop, 1 drop, Both Eyes, PRN, Remi, Devon, NP, 1 drop at 11/25/23 2131   aspirin  EC tablet 81 mg, 81 mg, Oral, Daily, Shafer, Devon, NP, 81 mg at 11/27/23 1004   busPIRone  (BUSPAR ) tablet 5 mg, 5 mg, Oral, TID PRN, Remi Pippin, NP   Chlorhexidine  Gluconate Cloth 2 % PADS 6  each, 6 each, Topical, Daily, Michaela Aisha SQUIBB, MD, 6 each at 11/24/23 1145   ezetimibe  (ZETIA ) tablet 10 mg, 10 mg, Oral, Daily, Remi, [place], NP, 10 mg at 11/27/23 1004   fluticasone  (FLONASE ) 50 MCG/ACT nasal spray 1 spray, 1 spray, Each Nare, BID, Remi Pippin, NP, 1 spray at 11/27/23 9349   hydrALAZINE  (APRESOLINE ) injection 10-20 mg, 10-20 mg, Intravenous, Q4H PRN, Bui, Xem M, MD   labetalol  (NORMODYNE ) injection 10-20 mg, 10-20 mg, Intravenous, Q2H PRN, Bui, Xem M, MD   levothyroxine  (SYNTHROID ) tablet 100 mcg, 100 mcg, Oral, QAC breakfast, Remi, Pippin, NP, 100 mcg at 11/27/23 9359   losartan  (COZAAR ) tablet 25 mg, 25 mg, Oral, Daily, Shafer, Devon, NP, 25 mg at 11/27/23 1004   metoprolol  succinate (TOPROL -XL) 24 hr tablet 25 mg, 25 mg, Oral, BID, Shafer, Devon, NP, 25 mg at 11/27/23 1004   Oral care mouth rinse, 15 mL, Mouth Rinse, PRN, Michaela Aisha SQUIBB, MD   pantoprazole  (PROTONIX ) EC tablet 40 mg, 40 mg, Oral, BID, Shafer, Devon, NP, 40 mg at 11/27/23 9352   Pitavastatin  Calcium  TABS 4 mg, 4 mg, Oral, Daily, de La Torre, [place] E, NP, 4 mg at 11/27/23 1004   senna-docusate (Senokot-S) tablet 1 tablet, 1 tablet, Oral, QHS PRN, Waddell Karna LABOR, NP  Patients Current Diet:  Diet Order             Diet Heart Room service appropriate? Yes; Fluid consistency: Thin  Diet effective now                   Precautions / Restrictions Precautions Precautions:  Fall Restrictions Weight Bearing Restrictions Per Provider Order: No   Has the patient had 2 or more falls or a fall with injury in the past year? 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 Independent  Indoor Mobility: Did the patient need assistance with walking from room to room (with or without device)? Independent  Stairs: Did the patient need assistance with internal or external stairs (with or without device)? Independent  Functional Cognition: Did the patient need help planning regular tasks such as shopping or remembering to take medications? Independent  Patient Information Are you of Hispanic, Latino/a,or Spanish origin?: A. No, not of Hispanic, Latino/a, or Spanish origin What is your race?: A. White Do you need or want an interpreter to communicate with a doctor or health care staff?: 0. No Patient information obtained via proxy : No  Patient's Response To:  Health Literacy and Transportation Is the patient able to respond to health literacy and transportation needs?: Yes Health Literacy - How often do you need to have someone help you when you read instructions, pamphlets, or other written material from your doctor or pharmacy?: Never In the past 12 months, has lack of transportation kept you from medical appointments or from getting medications?: No In the past 12 months, has lack of transportation kept you from meetings, work, or from getting things needed for daily living?: No Health Literacy and Transportation obtained via proxy: No  Journalist, newspaper / Equipment Home Equipment: Agricultural consultant (2 wheels), Shower seat, Grab bars - toilet, Grab bars - tub/shower  Prior Device Use: Indicate devices/aids used by the patient prior to current illness, exacerbation or injury? Rolling Walker (would like  to have a rollator)  Current Functional  Level Cognition  Arousal/Alertness: Awake/alert Overall Cognitive Status: Within Functional Limits for tasks assessed Orientation Level: Oriented X4 Attention: Focused, Alternating, Sustained Focused Attention: Appears intact Sustained Attention: Appears intact Alternating Attention: Impaired Alternating Attention Impairment: Functional complex Memory: Impaired Memory Impairment: Decreased short term memory Decreased Short Term Memory: Verbal complex Awareness: Appears intact Problem Solving: Appears intact Executive Function: Organizing Organizing: Appears intact Safety/Judgment: Appears intact    Extremity Assessment (includes Sensation/Coordination)  Upper Extremity Assessment: LUE deficits/detail LUE Deficits / Details: functional strength, poor coordination; frequently dropping items; able touse to open toothpaste LUE Sensation: decreased light touch, decreased proprioception LUE Coordination: decreased fine motor, decreased gross motor  Lower Extremity Assessment: Defer to PT evaluation LLE Deficits / Details: decreased sensation and proprioception of LLE, decreased coordination with mvmt, mild L knee buckling in standing LLE Sensation: decreased proprioception, decreased light touch, history of peripheral neuropathy LLE Coordination: decreased gross motor    ADLs  Overall ADL's : Needs assistance/impaired Eating/Feeding: Sitting, Set up Grooming: Wash/dry hands, Oral care, Set up, Sitting Upper Body Bathing: Sitting, Minimal assistance Lower Body Bathing: Sit to/from stand, Moderate assistance Upper Body Dressing : Sitting, Minimal assistance Lower Body Dressing: Sit to/from stand, Moderate assistance Toilet Transfer: Ambulation, Rolling walker (2 wheels), Minimal assistance Functional mobility during ADLs: Minimal assistance, Rolling walker (2 wheels)    Mobility  Overal bed mobility: Needs Assistance Bed Mobility: Supine to  Sit Supine to sit: Supervision, HOB elevated, Used rails Sit to supine: Min assist General bed mobility comments: mod A for balance coming up to sitting. Pt able to return to supine and get LLE into bed but min A needed to position    Transfers  Overall transfer level: Needs assistance Equipment used: Rolling walker (2 wheels) Transfers: Sit to/from Stand Sit to Stand: Min assist Bed to/from chair/wheelchair/BSC transfer type:: Step pivot Step pivot transfers: Min assist General transfer comment: light rise and steady assist, cues for hand placement when rising and sitting. sit<>stand x5 throughout session    Ambulation / Gait / Stairs / Wheelchair Mobility  Ambulation/Gait Ambulation/Gait assistance: Editor, commissioning (Feet): 30 Feet (+15+15 to and from bathroom) Assistive device: Rolling walker (2 wheels) Gait Pattern/deviations: Step-to pattern, Decreased step length - left, Decreased dorsiflexion - left, Ataxic, Narrow base of support General Gait Details: assist for steadying pt and RW, RW management, max cues for heel strike and widening BOS with LLE Gait velocity: decr    Posture / Balance Dynamic Sitting Balance Sitting balance - Comments: L lateral bias in sitting, tactile cuing to correct Balance Overall balance assessment: Needs assistance Sitting-balance support: Single extremity supported, Feet supported Sitting balance-Leahy Scale: Fair Sitting balance - Comments: L lateral bias in sitting, tactile cuing to correct Postural control: Left lateral lean Standing balance support: Bilateral upper extremity supported, During functional activity Standing balance-Leahy Scale: Poor Standing balance comment: L lateral bias    Special considerations/life events  Diabetic management HgB A1C 5.6   Previous Home Environment (from acute therapy documentation) Living Arrangements: Spouse/significant other  Lives With: Spouse Available Help at Discharge: Family, Available  24 hours/day Type of Home: House Home Layout: One level Home Access: Stairs to enter, Ramped entrance [employer] Shower/Tub: Health visitor: Handicapped height Bathroom Accessibility: Yes  Discharge Living Setting Plans for Discharge Living Setting: Patient's home, House, Lives with (comment) (Lives with his wife.   Dtr lives locally.) Type of Home at Discharge: House Discharge Home Layout: One level Discharge Home Access: Ramped entrance Discharge Bathroom Shower/Tub:  Walk-in shower, Door Discharge Bathroom Toilet: Handicapped height Discharge Bathroom Accessibility: Yes How Accessible: Accessible via wheelchair, Accessible via walker  Social/Family/Support Systems Patient Roles: Spouse, Parent Contact Information: Leory Mains - dtr [phone] Anticipated Caregiver: Wife Anticipated Caregiver's Contact Information: Tashi Band - wife - [phone] Ability/Limitations of Caregiver: wife is 75 and can provide supervision.  Dtr lives close by. Caregiver Availability: 24/7 Discharge Plan Discussed with Primary Caregiver: Yes Is Caregiver In Agreement with Plan?: Yes Does Caregiver/Family have Issues with Lodging/Transportation while Pt is in Rehab?: No  Goals Patient/Family Goal for Rehab: PT/OT mod I and supervision goals Expected length of stay: 7 days Pt/Family Agrees to Admission and willing to participate: Yes Program Orientation Provided & Reviewed with Pt/Caregiver Including Roles  & Responsibilities: Yes  Decrease burden of Care through IP rehab admission: N/A  Possible need for SNF placement upon discharge: Not anticipated  Patient Condition: I have reviewed medical records from [facility], spoken with CM, and patient and daughter. I met with patient at the bedside and discussed via phone for inpatient rehabilitation assessment.  Patient will benefit from ongoing PT and OT, can actively participate in 3 hours of therapy a day 5 days of  the week, and can make measurable gains during the admission.  Patient will also benefit from the coordinated team approach during an Inpatient Acute Rehabilitation admission.  The patient will receive intensive therapy as well as Rehabilitation physician, nursing, social worker, and care management interventions.  Due to bladder management, bowel management, safety, skin/wound care, medication administration, pain management, and patient education the patient requires 24 hour a day rehabilitation nursing.  The patient is currently min to mod assist with mobility and basic ADLs.  Discharge setting and therapy post discharge at home with home health is anticipated.  Patient has agreed to participate in the Acute Inpatient Rehabilitation Program and will admit today.  Preadmission Screen Completed By:  Lovett CHRISTELLA Ropes, and [name], 11/27/2023 11:14 AM ______________________________________________________________________   Discussed status with Dr. Babs on 11/27/23  at 11:14 AM  and received approval for admission today.  Admission Coordinator:  Caitlin E Warren, PT, time 11:14 AM Pattricia 11/27/23    Assessment/Plan: Diagnosis: right thalamic infarct with SAH Does the need for close, 24 hr/day Medical supervision in concert with the patient's rehab needs make it unreasonable for this patient to be served in a less intensive setting? Yes Co-Morbidities requiring supervision/potential complications: HTN, CAD, CKD, CHF, hx GIB Due to bladder management, bowel management, safety, skin/wound care, disease management, medication administration, pain management, and patient education, does the patient require 24 hr/day rehab nursing? Yes Does the patient require coordinated care of a physician, rehab nurse, PT, OT to address physical and functional deficits in the context of the above medical diagnosis(es)? Yes Addressing deficits in the following areas: balance, endurance, locomotion, strength,  transferring, bowel/bladder control, bathing, dressing, feeding, grooming, toileting, cognition, and psychosocial support Can the patient actively participate in an intensive therapy program of at least 3 hrs of therapy 5 days a week? Yes The potential for patient to make measurable gains while on inpatient rehab is excellent Anticipated functional outcomes upon discharge from inpatient rehab: modified independent and supervision PT, modified independent and supervision OT, n/a SLP Estimated rehab length of stay to reach the above functional goals is: 7 days Anticipated discharge destination: Home 10. Overall Rehab/Functional Prognosis: excellent   MD Signature: Arthea IVAR Babs, MD, [facility] Physical Medicine & Rehabilitation Medical Director Rehabilitation Services  11/27/2023  

## 2023-11-26 NOTE — TOC CM/SW Note (Signed)
 Transition of Care [facility]) - Inpatient Brief Assessment   Patient Details  Name: Daniel Herrera MRN: [id number] Date of Birth: 01/07/1941  Transition of Care [facility]) CM/SW Contact:    Andrez JULIANNA George, RN Phone Number: 11/26/2023, 2:03 PM   Clinical Narrative:  IP care management following for CIR work up.    Transition of Care Asessment: Insurance and Status: Insurance coverage has been reviewed Patient has primary care physician: Yes Home environment has been reviewed: home with spouse   Prior/Current Home Services: No current home services Social Drivers of Health Review: SDOH reviewed no interventions necessary Readmission risk has been reviewed: Yes Transition of care needs: transition of care needs identified, TOC will continue to follow

## 2023-11-26 NOTE — Progress Notes (Signed)
 IP rehab admissions - I met with patient and tried to explain rehab.  He says he hopes to go home with [facility] so that he can be with his wife and not put a burden on his daughter.  He wants to wait to see how he does until Wednesday and then decide if he wants CIR.  I will ask my partner to check up on him tomorrow.  [phone]

## 2023-11-26 NOTE — Care Management Important Message (Signed)
 Important Message  Patient Details  Name: Daniel Herrera MRN: [id number] Date of Birth: Aug 22, 1940   Important Message Given:  Yes - Medicare IM     Claretta Deed 11/26/2023, 4:19 PM

## 2023-11-27 ENCOUNTER — Inpatient Hospital Stay (HOSPITAL_COMMUNITY)
Admission: AD | Admit: 2023-11-27 | Discharge: 2023-12-10 | DRG: 056 | Disposition: A | Discharge status: Home Health | Source: Intra-hospital | Attending: Physical Medicine and Rehabilitation | Admitting: Physical Medicine and Rehabilitation

## 2023-11-27 ENCOUNTER — Other Ambulatory Visit: Payer: Self-pay

## 2023-11-27 ENCOUNTER — Other Ambulatory Visit: Payer: Self-pay | Admitting: Cardiology

## 2023-11-27 ENCOUNTER — Encounter (HOSPITAL_COMMUNITY): Payer: Self-pay | Admitting: Physical Medicine and Rehabilitation

## 2023-11-27 DIAGNOSIS — I6381 Other cerebral infarction due to occlusion or stenosis of small artery: Secondary | ICD-10-CM

## 2023-11-27 DIAGNOSIS — I10 Essential (primary) hypertension: Secondary | ICD-10-CM | POA: Diagnosis present

## 2023-11-27 DIAGNOSIS — I69322 Dysarthria following cerebral infarction: Secondary | ICD-10-CM

## 2023-11-27 DIAGNOSIS — M19041 Primary osteoarthritis, right hand: Secondary | ICD-10-CM | POA: Diagnosis present

## 2023-11-27 DIAGNOSIS — Z85828 Personal history of other malignant neoplasm of skin: Secondary | ICD-10-CM | POA: Diagnosis not present

## 2023-11-27 DIAGNOSIS — N1832 Chronic kidney disease, stage 3b: Secondary | ICD-10-CM | POA: Diagnosis present

## 2023-11-27 DIAGNOSIS — G8929 Other chronic pain: Secondary | ICD-10-CM | POA: Diagnosis present

## 2023-11-27 DIAGNOSIS — M19072 Primary osteoarthritis, left ankle and foot: Secondary | ICD-10-CM | POA: Diagnosis present

## 2023-11-27 DIAGNOSIS — R319 Hematuria, unspecified: Secondary | ICD-10-CM | POA: Diagnosis present

## 2023-11-27 DIAGNOSIS — E1122 Type 2 diabetes mellitus with diabetic chronic kidney disease: Secondary | ICD-10-CM | POA: Diagnosis present

## 2023-11-27 DIAGNOSIS — Z79899 Other long term (current) drug therapy: Secondary | ICD-10-CM

## 2023-11-27 DIAGNOSIS — E785 Hyperlipidemia, unspecified: Secondary | ICD-10-CM | POA: Diagnosis present

## 2023-11-27 DIAGNOSIS — I214 Non-ST elevation (NSTEMI) myocardial infarction: Secondary | ICD-10-CM | POA: Diagnosis present

## 2023-11-27 DIAGNOSIS — G4700 Insomnia, unspecified: Secondary | ICD-10-CM | POA: Diagnosis present

## 2023-11-27 DIAGNOSIS — D841 Defects in the complement system: Secondary | ICD-10-CM | POA: Diagnosis not present

## 2023-11-27 DIAGNOSIS — R29701 NIHSS score 1: Secondary | ICD-10-CM | POA: Diagnosis not present

## 2023-11-27 DIAGNOSIS — J849 Interstitial pulmonary disease, unspecified: Secondary | ICD-10-CM | POA: Diagnosis not present

## 2023-11-27 DIAGNOSIS — I639 Cerebral infarction, unspecified: Secondary | ICD-10-CM

## 2023-11-27 DIAGNOSIS — I480 Paroxysmal atrial fibrillation: Secondary | ICD-10-CM | POA: Diagnosis present

## 2023-11-27 DIAGNOSIS — E119 Type 2 diabetes mellitus without complications: Secondary | ICD-10-CM

## 2023-11-27 DIAGNOSIS — E7800 Pure hypercholesterolemia, unspecified: Secondary | ICD-10-CM | POA: Diagnosis present

## 2023-11-27 DIAGNOSIS — J84112 Idiopathic pulmonary fibrosis: Secondary | ICD-10-CM | POA: Diagnosis present

## 2023-11-27 DIAGNOSIS — I252 Old myocardial infarction: Secondary | ICD-10-CM

## 2023-11-27 DIAGNOSIS — Z8546 Personal history of malignant neoplasm of prostate: Secondary | ICD-10-CM

## 2023-11-27 DIAGNOSIS — I509 Heart failure, unspecified: Secondary | ICD-10-CM | POA: Diagnosis present

## 2023-11-27 DIAGNOSIS — I609 Nontraumatic subarachnoid hemorrhage, unspecified: Secondary | ICD-10-CM | POA: Diagnosis present

## 2023-11-27 DIAGNOSIS — I6350 Cerebral infarction due to unspecified occlusion or stenosis of unspecified cerebral artery: Secondary | ICD-10-CM

## 2023-11-27 DIAGNOSIS — E118 Type 2 diabetes mellitus with unspecified complications: Secondary | ICD-10-CM | POA: Diagnosis present

## 2023-11-27 DIAGNOSIS — M19042 Primary osteoarthritis, left hand: Secondary | ICD-10-CM | POA: Diagnosis present

## 2023-11-27 DIAGNOSIS — N183 Chronic kidney disease, stage 3 unspecified: Secondary | ICD-10-CM | POA: Diagnosis present

## 2023-11-27 DIAGNOSIS — K529 Noninfective gastroenteritis and colitis, unspecified: Secondary | ICD-10-CM | POA: Diagnosis present

## 2023-11-27 DIAGNOSIS — G4733 Obstructive sleep apnea (adult) (pediatric): Secondary | ICD-10-CM | POA: Diagnosis not present

## 2023-11-27 DIAGNOSIS — F419 Anxiety disorder, unspecified: Secondary | ICD-10-CM | POA: Diagnosis present

## 2023-11-27 DIAGNOSIS — Z800 Family history of malignant neoplasm of digestive organs: Secondary | ICD-10-CM

## 2023-11-27 DIAGNOSIS — E039 Hypothyroidism, unspecified: Secondary | ICD-10-CM | POA: Diagnosis present

## 2023-11-27 DIAGNOSIS — Z9841 Cataract extraction status, right eye: Secondary | ICD-10-CM

## 2023-11-27 DIAGNOSIS — K5900 Constipation, unspecified: Secondary | ICD-10-CM | POA: Diagnosis present

## 2023-11-27 DIAGNOSIS — Z79890 Hormone replacement therapy (postmenopausal): Secondary | ICD-10-CM | POA: Diagnosis not present

## 2023-11-27 DIAGNOSIS — E1151 Type 2 diabetes mellitus with diabetic peripheral angiopathy without gangrene: Secondary | ICD-10-CM | POA: Diagnosis present

## 2023-11-27 DIAGNOSIS — K219 Gastro-esophageal reflux disease without esophagitis: Secondary | ICD-10-CM | POA: Diagnosis present

## 2023-11-27 DIAGNOSIS — I2510 Atherosclerotic heart disease of native coronary artery without angina pectoris: Secondary | ICD-10-CM | POA: Diagnosis present

## 2023-11-27 DIAGNOSIS — I63331 Cerebral infarction due to thrombosis of right posterior cerebral artery: Secondary | ICD-10-CM | POA: Diagnosis not present

## 2023-11-27 DIAGNOSIS — I130 Hypertensive heart and chronic kidney disease with heart failure and stage 1 through stage 4 chronic kidney disease, or unspecified chronic kidney disease: Secondary | ICD-10-CM | POA: Diagnosis present

## 2023-11-27 DIAGNOSIS — I69392 Facial weakness following cerebral infarction: Secondary | ICD-10-CM | POA: Diagnosis not present

## 2023-11-27 DIAGNOSIS — N400 Enlarged prostate without lower urinary tract symptoms: Secondary | ICD-10-CM | POA: Diagnosis present

## 2023-11-27 DIAGNOSIS — Z8673 Personal history of transient ischemic attack (TIA), and cerebral infarction without residual deficits: Secondary | ICD-10-CM

## 2023-11-27 DIAGNOSIS — Z794 Long term (current) use of insulin: Secondary | ICD-10-CM | POA: Diagnosis not present

## 2023-11-27 DIAGNOSIS — Z7982 Long term (current) use of aspirin: Secondary | ICD-10-CM

## 2023-11-27 DIAGNOSIS — Z951 Presence of aortocoronary bypass graft: Secondary | ICD-10-CM

## 2023-11-27 DIAGNOSIS — Z9842 Cataract extraction status, left eye: Secondary | ICD-10-CM

## 2023-11-27 DIAGNOSIS — Z8711 Personal history of peptic ulcer disease: Secondary | ICD-10-CM

## 2023-11-27 DIAGNOSIS — I63212 Cerebral infarction due to unspecified occlusion or stenosis of left vertebral arteries: Secondary | ICD-10-CM

## 2023-11-27 DIAGNOSIS — Z955 Presence of coronary angioplasty implant and graft: Secondary | ICD-10-CM

## 2023-11-27 DIAGNOSIS — M19071 Primary osteoarthritis, right ankle and foot: Secondary | ICD-10-CM | POA: Diagnosis present

## 2023-11-27 DIAGNOSIS — M25512 Pain in left shoulder: Secondary | ICD-10-CM | POA: Diagnosis present

## 2023-11-27 DIAGNOSIS — I69354 Hemiplegia and hemiparesis following cerebral infarction affecting left non-dominant side: Secondary | ICD-10-CM | POA: Diagnosis not present

## 2023-11-27 DIAGNOSIS — I69393 Ataxia following cerebral infarction: Secondary | ICD-10-CM | POA: Diagnosis not present

## 2023-11-27 DIAGNOSIS — E079 Disorder of thyroid, unspecified: Secondary | ICD-10-CM | POA: Diagnosis present

## 2023-11-27 DIAGNOSIS — R9082 White matter disease, unspecified: Secondary | ICD-10-CM | POA: Diagnosis not present

## 2023-11-27 HISTORY — DX: Cerebral infarction, unspecified: I63.9

## 2023-11-27 LAB — GLUCOSE, CAPILLARY
Glucose-Capillary: 126 mg/dL — ABNORMAL HIGH (ref 70–99)
Glucose-Capillary: 143 mg/dL — ABNORMAL HIGH (ref 70–99)
Glucose-Capillary: 132 mg/dL — ABNORMAL HIGH (ref 70–99)
Glucose-Capillary: 121 mg/dL — ABNORMAL HIGH (ref 70–99)

## 2023-11-27 LAB — BASIC METABOLIC PANEL WITH GFR
Anion gap: 9 (ref 5–15)
BUN: 33 mg/dL — ABNORMAL HIGH (ref 8–23)
CO2: 22 mmol/L (ref 22–32)
Calcium: 9.1 mg/dL (ref 8.9–10.3)
Chloride: 105 mmol/L (ref 98–111)
Creatinine, Ser: 1.83 mg/dL — ABNORMAL HIGH (ref 0.61–1.24)
GFR, Estimated: 36 mL/min — ABNORMAL LOW (ref >60)
Glucose, Bld: 111 mg/dL — ABNORMAL HIGH (ref 70–99)
Potassium: 3.8 mmol/L (ref 3.5–5.1)
Sodium: 136 mmol/L (ref 135–145)

## 2023-11-27 LAB — CBC
HCT: 37.2 % — ABNORMAL LOW (ref 39.0–52.0)
Hemoglobin: 12.9 g/dL — ABNORMAL LOW (ref 13.0–17.0)
MCH: 31.9 pg (ref 26.0–34.0)
MCHC: 34.7 g/dL (ref 30.0–36.0)
MCV: 91.9 fL (ref 80.0–100.0)
Platelets: 174 K/uL (ref 150–400)
RBC: 4.05 MIL/uL — ABNORMAL LOW (ref 4.22–5.81)
RDW: 12.4 % (ref 11.5–15.5)
WBC: 7.6 K/uL (ref 4.0–10.5)
nRBC: 0 % (ref 0.0–0.2)

## 2023-11-27 MED ORDER — GUAIFENESIN-DM 100-10 MG/5ML PO SYRP: 5.0000 mL | ORAL_SOLUTION | Freq: Four times a day (QID) | ORAL | Status: DC | PRN

## 2023-11-27 MED ORDER — EZETIMIBE 10 MG PO TABS: 10.0000 mg | ORAL_TABLET | Freq: Every day | ORAL | 0 refills | Status: AC

## 2023-11-27 MED ORDER — FLUTICASONE PROPIONATE 50 MCG/ACT NA SUSP
1.0000 | Freq: Two times a day (BID) | NASAL | Status: DC
  Administered 2023-11-27 – 2023-12-10 (×26): 1 via NASAL
  Filled 2023-11-27: qty 16

## 2023-11-27 MED ORDER — LOSARTAN POTASSIUM 25 MG PO TABS
25.0000 mg | ORAL_TABLET | Freq: Every day | ORAL | Status: DC
Start: 2023-11-28 — End: 2023-12-10
  Administered 2023-11-28 – 2023-12-10 (×13): 25 mg via ORAL
  Filled 2023-11-27 (×13): qty 1

## 2023-11-27 MED ORDER — NON FORMULARY: 4.0000 mg | Freq: Every day | Status: DC

## 2023-11-27 MED ORDER — TEMAZEPAM 7.5 MG PO CAPS
15.0000 mg | ORAL_CAPSULE | Freq: Every evening | ORAL | Status: DC | PRN
  Administered 2023-11-28 – 2023-12-09 (×12): 15 mg via ORAL
  Filled 2023-11-27 (×12): qty 2

## 2023-11-27 MED ORDER — AMLODIPINE BESYLATE 2.5 MG PO TABS
2.5000 mg | ORAL_TABLET | Freq: Every day | ORAL | Status: DC
  Administered 2023-11-28 – 2023-12-10 (×13): 2.5 mg via ORAL
  Filled 2023-11-27 (×13): qty 1

## 2023-11-27 MED ORDER — EZETIMIBE 10 MG PO TABS
10.0000 mg | ORAL_TABLET | Freq: Every day | ORAL | Status: DC
  Administered 2023-11-28 – 2023-12-10 (×13): 10 mg via ORAL
  Filled 2023-11-27 (×13): qty 1

## 2023-11-27 MED ORDER — PANTOPRAZOLE SODIUM 40 MG PO TBEC
40.0000 mg | DELAYED_RELEASE_TABLET | Freq: Two times a day (BID) | ORAL | Status: DC
  Administered 2023-11-27 – 2023-12-10 (×26): 40 mg via ORAL
  Filled 2023-11-27 (×26): qty 1

## 2023-11-27 MED ORDER — BUSPIRONE HCL 10 MG PO TABS: 5.0000 mg | ORAL_TABLET | Freq: Three times a day (TID) | ORAL | Status: DC | PRN

## 2023-11-27 MED ORDER — ASPIRIN 81 MG PO TBEC
81.0000 mg | DELAYED_RELEASE_TABLET | Freq: Every day | ORAL | Status: DC
  Administered 2023-11-28 – 2023-12-10 (×13): 81 mg via ORAL
  Filled 2023-11-27 (×12): qty 1

## 2023-11-27 MED ORDER — POLYVINYL ALCOHOL 1.4 % OP SOLN
1.0000 [drp] | OPHTHALMIC | Status: DC | PRN
  Administered 2023-11-27 – 2023-12-06 (×8): 1 [drp] via OPHTHALMIC

## 2023-11-27 MED ORDER — FLEET ENEMA RE ENEM: 1.0000 | ENEMA | Freq: Once | RECTAL | Status: DC | PRN

## 2023-11-27 MED ORDER — POLYVINYL ALCOHOL 1.4 % OP SOLN: 1.0000 [drp] | OPHTHALMIC | 0 refills | Status: AC | PRN

## 2023-11-27 MED ORDER — ALUM & MAG HYDROXIDE-SIMETH 200-200-20 MG/5ML PO SUSP: 30.0000 mL | ORAL | Status: DC | PRN

## 2023-11-27 MED ORDER — PROCHLORPERAZINE 25 MG RE SUPP: 12.5000 mg | Freq: Four times a day (QID) | RECTAL | Status: DC | PRN

## 2023-11-27 MED ORDER — PROCHLORPERAZINE MALEATE 5 MG PO TABS: 5.0000 mg | ORAL_TABLET | Freq: Four times a day (QID) | ORAL | Status: DC | PRN

## 2023-11-27 MED ORDER — METOPROLOL SUCCINATE ER 25 MG PO TB24
25.0000 mg | ORAL_TABLET | Freq: Two times a day (BID) | ORAL | Status: DC
  Administered 2023-11-27 – 2023-12-10 (×26): 25 mg via ORAL
  Filled 2023-11-27 (×26): qty 1

## 2023-11-27 MED ORDER — PITAVASTATIN CALCIUM 4 MG PO TABS
4.0000 mg | ORAL_TABLET | Freq: Every day | ORAL | Status: DC
Start: 2023-11-28 — End: 2023-12-10
  Administered 2023-11-28 – 2023-12-10 (×13): 4 mg via ORAL
  Filled 2023-11-27 (×17): qty 1

## 2023-11-27 MED ORDER — PROCHLORPERAZINE EDISYLATE 10 MG/2ML IJ SOLN: 5.0000 mg | Freq: Four times a day (QID) | INTRAMUSCULAR | Status: DC | PRN

## 2023-11-27 MED ORDER — BISACODYL 10 MG RE SUPP: 10.0000 mg | Freq: Every day | RECTAL | Status: DC | PRN

## 2023-11-27 MED ORDER — LEVOTHYROXINE SODIUM 100 MCG PO TABS
100.0000 ug | ORAL_TABLET | Freq: Every day | ORAL | Status: DC
  Administered 2023-11-28 – 2023-12-10 (×13): 100 ug via ORAL
  Filled 2023-11-27 (×13): qty 1

## 2023-11-27 NOTE — Progress Notes (Signed)
 Ordering 30 day monitor, post stroke Dr. Edwyna to read

## 2023-11-27 NOTE — Progress Notes (Signed)
 Physical Therapy Treatment Patient Details Name: Daniel Herrera MRN: [id number] DOB: [birth year] Today's Date: 11/27/2023   History of Present Illness Pt is 83 yo male who presents on 11/23/23 with c/o L sided weakness and tingling. Pt received TNK 1843.  PMH: CVA, CAD, CABG, pAfib, prostate cancer, DM2, HTN, HLD, interstitial lung disease, hereditary angioedema, R foot injury    PT Comments  Pt received in recliner, agreeable to participation in therapy. He required min assist sit to stand, and mod assist amb 25' x 2 with RW. Cues needed for sequencing, LLE step length, and posture. Pt returned to recliner at end of session with feet elevated.     If plan is discharge home, recommend the following: A lot of help with walking and/or transfers;A little help with bathing/dressing/bathroom;Assistance with cooking/housework;Assist for transportation;Help with stairs or ramp for entrance   Can travel by private vehicle        Equipment Recommendations  None recommended by PT    Recommendations for Other Services       Precautions / Restrictions Precautions Precautions: Fall Recall of Precautions/Restrictions: Impaired     Mobility  Bed Mobility               General bed mobility comments: Pt up in recliner.    Transfers Overall transfer level: Needs assistance Equipment used: Rolling walker (2 wheels) Transfers: Sit to/from Stand Sit to Stand: Min assist           General transfer comment: Cues for hand placement and sequencing. Assist to power up    Ambulation/Gait Ambulation/Gait assistance: Min assist Gait Distance (Feet): 25 Feet (x 2) Assistive device: Rolling walker (2 wheels) Gait Pattern/deviations: Step-to pattern, Decreased step length - left, Decreased dorsiflexion - left, Ataxic, Narrow base of support, Trunk flexed Gait velocity: decreased Gait velocity interpretation: <1.8 ft/sec, indicate of risk for recurrent falls   General Gait Details:  Seated rest break between gait trial. Decreased coordination LLE with pt frequently demonstrating too great of step length. Cues for LLE step length and posture.   Stairs             Wheelchair Mobility     Tilt Bed    Modified Rankin (Stroke Patients Only) Modified Rankin (Stroke Patients Only) Pre-Morbid Rankin Score: No symptoms Modified Rankin: Moderately severe disability     Balance Overall balance assessment: Needs assistance Sitting-balance support: Single extremity supported, Feet supported Sitting balance-Leahy Scale: Fair     Standing balance support: Bilateral upper extremity supported, During functional activity, Reliant on assistive device for balance Standing balance-Leahy Scale: Poor                              Communication Communication Communication: Impaired Factors Affecting Communication: Hearing impaired  Cognition Arousal: Alert Behavior During Therapy: WFL for tasks assessed/performed   PT - Cognitive impairments: Attention, Problem solving, Safety/Judgement                         Following commands: Impaired Following commands impaired: Follows one step commands with increased time    Cueing Cueing Techniques: Verbal cues, Gestural cues, Tactile cues  Exercises      General Comments        Pertinent Vitals/Pain Pain Assessment Pain Assessment: No/denies pain    Home Living  Prior Function            PT Goals (current goals can now be found in the care plan section) Acute Rehab PT Goals Patient Stated Goal: home Progress towards PT goals: Progressing toward goals    Frequency    Min 2X/week      PT Plan      Co-evaluation              AM-PAC PT 6 Clicks Mobility   Outcome Measure  Help needed turning from your back to your side while in a flat bed without using bedrails?: A Little Help needed moving from lying on your back to sitting on the  side of a flat bed without using bedrails?: A Little Help needed moving to and from a bed to a chair (including a wheelchair)?: A Lot Help needed standing up from a chair using your arms (e.g., wheelchair or bedside chair)?: A Little Help needed to walk in hospital room?: A Lot Help needed climbing 3-5 steps with a railing? : Total 6 Click Score: 14    End of Session Equipment Utilized During Treatment: Gait belt Activity Tolerance: Patient tolerated treatment well Patient left: in chair;with call bell/phone within reach;with chair alarm set Nurse Communication: Mobility status PT Visit Diagnosis: Unsteadiness on feet (R26.81);Hemiplegia and hemiparesis;Pain;Difficulty in walking, not elsewhere classified (R26.2) Hemiplegia - Right/Left: Left Hemiplegia - dominant/non-dominant: Non-dominant Hemiplegia - caused by: Cerebral infarction     Time: 1100-1126 PT Time Calculation (min) (ACUTE ONLY): 26 min  Charges:    $Gait Training: 23-37 mins PT General Charges $$ ACUTE PT VISIT: 1 Visit                     Sari MATSU., PT  Office # [phone]    Erven Sari Shaker 11/27/2023, 12:01 PM

## 2023-11-27 NOTE — Progress Notes (Signed)
 Occupational Therapy Treatment Patient Details Name: Daniel Herrera MRN: [id number] DOB: 1940/11/05 Today's Date: 11/27/2023   History of present illness Pt is 83 yo male who presents on 11/23/23 with c/o L sided weakness and tingling. Pt received TNK 1843.  PMH: CVA, CAD, CABG, pAfib, prostate cancer, DM2, HTN, HLD, interstitial lung disease, hereditary angioedema, R foot injury   OT comments  Pt making good progress toward goals. Requires min A for mobility and mod A for ADL @ RW level. Excellent candidate for intensive inpatient follow-up therapy, >3 hours/day to maximize functional level of independence with goal of returning home with wife at modified independent level. Acute OT to follow.       If plan is discharge home, recommend the following:  Assist for transportation;Assistance with cooking/housework;A lot of help with walking and/or transfers;A lot of help with bathing/dressing/bathroom   Equipment Recommendations  None recommended by OT    Recommendations for Other Services      Precautions / Restrictions Precautions Precautions: Fall Restrictions Weight Bearing Restrictions Per Provider Order: No       Mobility Bed Mobility Overal bed mobility: Needs Assistance Bed Mobility: Supine to Sit     Supine to sit: Supervision, HOB elevated, Used rails          Transfers Overall transfer level: Needs assistance Equipment used: Rolling walker (2 wheels) Transfers: Sit to/from Stand Sit to Stand: Min assist     Step pivot transfers: Min assist           Balance Overall balance assessment: Needs assistance Sitting-balance support: Single extremity supported, Feet supported Sitting balance-Leahy Scale: Fair Sitting balance - Comments: L lateral bias in sitting, tactile cuing to correct Postural control: Left lateral lean Standing balance support: Bilateral upper extremity supported, During functional activity Standing balance-Leahy Scale: Poor Standing  balance comment: L lateral bias                           ADL either performed or assessed with clinical judgement   ADL Overall ADL's : Needs assistance/impaired Eating/Feeding: Sitting;Set up   Grooming: Wash/dry hands;Oral care;Set up;Sitting   Upper Body Bathing: Sitting;Minimal assistance   Lower Body Bathing: Sit to/from stand;Moderate assistance   Upper Body Dressing : Sitting;Minimal assistance   Lower Body Dressing: Sit to/from stand;Moderate assistance   Toilet Transfer: Ambulation;Rolling walker (2 wheels);Minimal assistance           Functional mobility during ADLs: Minimal assistance;Rolling walker (2 wheels)      Extremity/Trunk Assessment Upper Extremity Assessment Upper Extremity Assessment: LUE deficits/detail LUE Deficits / Details: functional strength, poor coordination; frequently dropping items; able touse to open toothpaste LUE Sensation: decreased light touch;decreased proprioception LUE Coordination: decreased fine motor;decreased gross motor   Lower Extremity Assessment Lower Extremity Assessment: Defer to PT evaluation LLE Deficits / Details: decreased sensation and proprioception of LLE, decreased coordination with mvmt, mild L knee buckling in standing LLE Sensation: decreased proprioception;decreased light touch;history of peripheral neuropathy LLE Coordination: decreased gross motor        Vision   Vision Assessment?: Wears glasses for reading;Wears glasses for driving   Perception Perception Perception: Within Functional Limits   Praxis Praxis Praxis: WFL   Communication Communication Communication: Impaired Factors Affecting Communication: Hearing impaired;Reduced clarity of speech   Cognition Arousal: Alert Behavior During Therapy: WFL for tasks assessed/performed Cognition: Cognition impaired       Memory impairment (select all impairments): Short-term memory, 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[employer], Engineer, structural  memory Attention impairment (select first level of impairment): Selective attention Executive functioning impairment (select all impairments): Sequencing, Reasoning, Problem solving                   Following commands: Impaired Following commands impaired: Follows one step commands with increased time      Cueing   Cueing Techniques: Verbal cues, Gestural cues, Tactile cues  Exercises Other Exercises Other Exercises: green foam cube for L hand grip/pinch    Shoulder Instructions       General Comments      Pertinent Vitals/ Pain       Pain Assessment Pain Assessment: Faces Faces Pain Scale: Hurts a little bit Pain Location: L side neck Pain Descriptors / Indicators: Tightness Pain Intervention(s): Limited activity within patient's tolerance  Home Living                                          Prior Functioning/Environment              Frequency  Min 2X/week        Progress Toward Goals  OT Goals(current goals can now be found in the care plan section)  Progress towards OT goals: Progressing toward goals  Acute Rehab OT Goals Patient Stated Goal: get rehab for a few days OT Goal Formulation: With patient Time For Goal Achievement: 12/10/23 Potential to Achieve Goals: Good ADL Goals Pt Will Perform Grooming: with supervision;standing Pt Will Perform Upper Body Dressing: with supervision;sitting Pt Will Perform Lower Body Dressing: with supervision;sit to/from stand Pt Will Transfer to Toilet: with supervision;regular height toilet;ambulating  Plan      Co-evaluation                 AM-PAC OT 6 Clicks Daily Activity     Outcome Measure   Help from another person eating meals?: A Little Help from another person taking care of personal grooming?: A Little Help from another person toileting, which includes using toliet, bedpan, or urinal?: A Little Help from another person bathing (including washing, rinsing,  drying)?: A Lot Help from another person to put on and taking off regular upper body clothing?: A Little Help from another person to put on and taking off regular lower body clothing?: A Lot 6 Click Score: 16    End of Session Equipment Utilized During Treatment: Gait belt;Rolling walker (2 wheels)  OT Visit Diagnosis: Unsteadiness on feet (R26.81);Other abnormalities of gait and mobility (R26.89);Muscle weakness (generalized) (M62.81);Hemiplegia and hemiparesis Hemiplegia - Right/Left: Left Hemiplegia - dominant/non-dominant: Non-Dominant Hemiplegia - caused by: Cerebral infarction   Activity Tolerance Patient tolerated treatment well   Patient Left in chair;with call bell/phone within reach;with chair alarm set   Nurse Communication Mobility status        Time: 0940-1004 OT Time Calculation (min): 24 min  Charges: OT General Charges $OT Visit: 1 Visit OT Treatments $Self Care/Home Management : 23-37 mins  Kreg Sink, OT/L   Acute OT Clinical Specialist Acute Rehabilitation Services Pager [phone] Office [phone]   [facility] 11/27/2023, 10:14 AM

## 2023-11-27 NOTE — Discharge Summary (Addendum)
 Done.  Stroke Discharge Summary  Patient ID: Daniel Herrera   MRN: [id number]      DOB: October 07, 1940  Date of Admission: 11/23/2023 Date of Discharge: 11/27/2023  Attending Physician:  Stroke, Md, MD Consultant(s):    None  Patient's PCP:  Erick Greig LABOR, NP  DISCHARGE PRIMARY DIAGNOSIS:  Acute Ischemic Infarct:  right hemispheric infarct s/p TNK  Etiology:  likely hypertensive vs intracranial atherosclerosis though hx of stress induced Afib post CABG  Patient Active Problem List   Diagnosis Date Noted   Hypothyroidism, unspecified 06/15/2021   GERD without esophagitis 06/15/2021   Type 2 diabetes mellitus without complications (HCC) 06/15/2021   Stroke (cerebrum) (HCC) 06/15/2021   Cerebellar stroke (HCC) 06/14/2021   Open metatarsal fracture 09/02/2020   Dysrhythmia    Geographic tongue    History of kidney stones    Hypothyroidism    Peripheral vascular disease (HCC)    Pre-diabetes    Urticaria    Thyroid  disease    Skin cancer    Prostate enlargement    Prostate cancer (HCC)    Personal history of digestive disease    Myocardial infarction (HCC)    Hypertension    Hx of umbilical hernia repair    Hx of Clostridium difficile infection    History of prolonged Q-T interval on ECG    High cholesterol    Gout    GERD (gastroesophageal reflux disease)    Arthritis    Anxiety    Angio-edema    Cataract    Diverticular hemorrhage    Lower GI bleed 06/03/2019   Acute lower GI bleeding 05/28/2019   Coronary artery disease 05/28/2019   Diverticulosis    Glossitis, benign migratory 12/04/2018   Hoarseness 12/04/2018   PAF (paroxysmal atrial fibrillation) (HCC) 06/13/2018   S/P CABG x 3 05/28/2018   NSTEMI (non-ST elevated myocardial infarction) (HCC) 05/21/2018   Tachycardia determined by examination of pulse    CHF (congestive heart failure) (HCC) 2020   Pre-operative clearance 01/02/2018   Hematochezia 12/21/2017   CKD (chronic kidney disease) stage 3, GFR 30-59  ml/min 12/21/2017   GI bleed 12/21/2017   Hereditary angioedema type 2 (HCC) 07/28/2017   Enterotoxigenic Escherichia coli infection 05/24/2015   STEC (Shiga toxin-producing Escherichia coli) infection 05/24/2015   Colitis 05/23/2015   History of prostate cancer    Chronic diarrhea of unknown origin    CAD S/P percutaneous coronary angioplasty    Type 2 diabetes mellitus with unspecified complications (HCC)    Palpitations 03/15/2015   History of non-ST elevation myocardial infarction (NSTEMI) 02/22/2015   Abnormal EKG 02/21/2015   Essential hypertension 02/21/2015   Dyslipidemia, goal LDL below 70 02/21/2015   Malignant neoplasm of prostate (HCC) 08/07/2014   Anginal pain (HCC) 2016   ILD (interstitial lung disease) (HCC) 12/17/2013     Allergies as of 11/27/2023       Reactions   Zestril  [lisinopril ] Anaphylaxis, Swelling   Face, lips, and throat swell   Buspirone  Other (See Comments)   Didn't like how it made him feel   Roxicodone  [oxycodone ] Other (See Comments)   Hallucinations   Tape Other (See Comments)   PLASTIC TAPE TEARS OFF THE SKIN- Please use an alternative!!   Benadryl [diphenhydramine Hcl] Swelling, Other (See Comments)   Facial swelling from high dose (tolerates Advil  PM as needed)   Darvocet [propoxyphene N-acetaminophen ] Other (See Comments)   Hallucinations   Darvon [propoxyphene Hcl] Other (See Comments)  Hallucination   Norvasc  [amlodipine ] Palpitations, Rash, Other (See Comments)   Heart pounding Headache  Rash on ankles   Tylenol  [acetaminophen ] Swelling, Other (See Comments)   Foot swelling        Medication List     STOP taking these medications    promethazine-dextromethorphan  6.25-15 MG/5ML syrup Commonly known as: PROMETHAZINE-DM       TAKE these medications    albuterol  108 (90 Base) MCG/ACT inhaler Commonly known as: VENTOLIN  HFA Inhale 2 puffs into the lungs. Every 4-6 hours as needed   amLODipine  2.5 MG tablet Commonly  known as: NORVASC  TAKE 1 TABLET BY MOUTH EVERY DAY   artificial tears ophthalmic solution Place 1 drop into both eyes as needed for dry eyes.   aspirin  EC 81 MG tablet Take 1 tablet (81 mg total) by mouth daily.   Azelastine  HCl 137 MCG/SPRAY Soln Place 1 spray into both nostrils 2 (two) times daily.   betamethasone  valerate ointment 0.1 % Commonly known as: VALISONE  Apply 1 application topically 2 (two) times daily as needed for rash.   ezetimibe  10 MG tablet Commonly known as: ZETIA  Take 1 tablet (10 mg total) by mouth daily. Start taking on: November 28, 2023   fluoruracil 0.5 % cream Commonly known as: CARAC  Apply to skin cancer for 1-7 days during 'flare up' as directed.   fluticasone  50 MCG/ACT nasal spray Commonly known as: FLONASE  Place 1 spray into both nostrils 2 (two) times daily.   icatibant  Acetate 30 MG/3ML injection Commonly known as: FIRAZYR  Inject 30 mg into the skin once as needed (HAE).   levothyroxine  100 MCG tablet Commonly known as: SYNTHROID  Take 100 mcg by mouth daily before breakfast.   Livalo  4 MG Tabs Generic drug: Pitavastatin  Calcium  Take 4 mg by mouth at bedtime.   losartan  25 MG tablet Commonly known as: COZAAR  Take 1 tablet (25 mg total) by mouth daily. What changed: when to take this   metoprolol  succinate 25 MG 24 hr tablet Commonly known as: TOPROL -XL TAKE 1 TABLET BY MOUTH TWICE A DAY   Mucinex  Maximum Strength 1200 MG Tb12 Generic drug: Guaifenesin  Take 1,200 mg by mouth at bedtime.   Multivitamin Men 50+ Tabs Take 1 tablet by mouth daily.   nabumetone  750 MG tablet Commonly known as: RELAFEN  Take 750 mg by mouth daily.   nitroGLYCERIN  0.4 MG SL tablet Commonly known as: NITROSTAT  Place 0.4 mg under the tongue every 5 (five) minutes as needed for chest pain.   OVER THE COUNTER MEDICATION Take 1 capsule by mouth in the morning and at bedtime. Health Plus/Super Colon Cleanse   pantoprazole  40 MG tablet Commonly  known as: PROTONIX  Take 40 mg by mouth 2 (two) times daily.   PROBIOTIC DAILY PO Take 1 capsule by mouth daily.   Takhzyro  300 MG/2ML Sosy Generic drug: Lanadelumab -flyo INJECT 300MG  SUBCUTANEOUSLY  EVERY 2 WEEKS What changed: See the new instructions.   temazepam  15 MG capsule Commonly known as: RESTORIL  Take 15 mg by mouth at bedtime.   VITAMIN B-12 PO Take 1 tablet by mouth daily.        LABORATORY STUDIES CBC    Component Value Date/Time   WBC 7.6 11/27/2023 0435   RBC 4.05 (L) 11/27/2023 0435   HGB 12.9 (L) 11/27/2023 0435   HGB 15.4 09/12/2017 1034   HCT 37.2 (L) 11/27/2023 0435   HCT 46.3 09/12/2017 1034   PLT 174 11/27/2023 0435   PLT 172 09/12/2017 1034   MCV 91.9 11/27/2023  0435   MCV 96 09/12/2017 1034   MCH 31.9 11/27/2023 0435   MCHC 34.7 11/27/2023 0435   RDW 12.4 11/27/2023 0435   RDW 14.0 09/12/2017 1034   LYMPHSABS 1.2 11/23/2023 1814   LYMPHSABS 1.0 09/12/2017 1034   MONOABS 0.6 11/23/2023 1814   EOSABS 0.1 11/23/2023 1814   EOSABS 0.1 09/12/2017 1034   BASOSABS 0.1 11/23/2023 1814   BASOSABS 0.0 09/12/2017 1034   CMP    Component Value Date/Time   NA 136 11/27/2023 0435   K 3.8 11/27/2023 0435   CL 105 11/27/2023 0435   CO2 22 11/27/2023 0435   GLUCOSE 111 (H) 11/27/2023 0435   BUN 33 (H) 11/27/2023 0435   CREATININE 1.83 (H) 11/27/2023 0435   CREATININE 1.29 (H) 04/02/2015 1146   CALCIUM  9.1 11/27/2023 0435   PROT 6.9 11/23/2023 1814   PROT 6.5 05/23/2019 0826   ALBUMIN  4.1 11/23/2023 1814   ALBUMIN  4.6 05/23/2019 0826   AST 31 11/23/2023 1814   ALT 22 11/23/2023 1814   ALKPHOS 66 11/23/2023 1814   BILITOT 0.7 11/23/2023 1814   BILITOT 0.3 05/23/2019 0826   GFRNONAA 36 (L) 11/27/2023 0435   GFRAA 51 (L) 06/06/2019 0613   COAGS Lab Results  Component Value Date   INR 1.0 11/23/2023   INR 1.1 06/14/2021   INR 1.36 05/24/2018   Lipid Panel    Component Value Date/Time   CHOL 168 11/24/2023 1146   CHOL 183 05/23/2019  0826   TRIG 144 11/24/2023 1146   HDL 33 (L) 11/24/2023 1146   HDL 44 05/23/2019 0826   CHOLHDL 5.1 11/24/2023 1146   VLDL 29 11/24/2023 1146   LDLCALC 106 (H) 11/24/2023 1146   LDLCALC 108 (H) 05/23/2019 0826   HgbA1C  Lab Results  Component Value Date   HGBA1C 5.6 11/23/2023   Alcohol  Level    Component Value Date/Time   [facility] <15 11/23/2023 1825     SIGNIFICANT DIAGNOSTIC STUDIES CT HEAD WO CONTRAST ( ) Result Date: 11/25/2023 CLINICAL DATA:  83 year old male code stroke presentation on 11/23/2023. Chronically occluded left vertebral artery. Status post TNK, possible trace left vertex subarachnoid hemorrhage on post treatment MRI. EXAM: CT HEAD WITHOUT CONTRAST TECHNIQUE: Contiguous axial images were obtained from the base of the skull through the vertex without intravenous contrast. RADIATION DOSE REDUCTION: This exam was performed according to the departmental dose-optimization program which includes automated exposure control, adjustment of the mA and/or kV according to patient size and/or use of iterative reconstruction technique. COMPARISON:  Brain MRI 1711 hours 11/24/2023. Presentation head CT 11/23/2023. FINDINGS: Brain: Hypodensity at the right thalamic lacunar infarct site corresponding to DWI findings (series 3, image 17). No mass effect. No ventriculomegaly. Patchy chronic left cerebellar infarcts redemonstrated. Stable gray-white differentiation elsewhere. Trace subarachnoid hemorrhage is confirmed along the left vertex. Compare sagittal image 43 today to series 6, image 21 on 11/23/2023. Heidelberg classification 3c: Subarachnoid hemorrhage. No intracranial mass effect. No IVH. No ventriculomegaly. No other intracranial hemorrhage identified. Vascular: Calcified atherosclerosis at the skull base. No suspicious intracranial vascular hyperdensity. Skull: Appears stable and intact. Sinuses/Orbits: Left maxillary sinus fluid and mucosal thickening is stable. Other: No acute orbit  or scalp soft tissue finding. IMPRESSION: 1. Subtle but confirmed trace left vertex subarachnoid hemorrhage (sagittal image 43). No intracranial mass effect or other complicating features. 2. Right thalamic infarct stable from MRI yesterday. 3. No other acute intracranial abnormality. Salient findings were communicated to Dr. MAGDALENO Blower at 6:04 am on 11/25/2023 by  text page via the [facility] messaging system. Electronically Signed   By: VEAR Hurst M.D.   On: 11/25/2023 06:05   MR BRAIN WO CONTRAST Result Date: 11/25/2023 CLINICAL DATA:  83 year old male code stroke presentation on 11/23/2023. Chronically occluded left vertebral artery. Status post TNK. EXAM: MRI HEAD WITHOUT CONTRAST TECHNIQUE: Multiplanar, multiecho pulse sequences of the brain and surrounding structures were obtained without intravenous contrast. COMPARISON:  CT head and CTA head and neck 11/23/2018. Brain MRI 06/14/2021. FINDINGS: Brain: Oval 15 mm focus of intense restricted diffusion right thalamus, near the posterior limb right internal capsule (series 2, image 26). T2 and FLAIR hyperintensity. No hemorrhage or mass effect. Contralateral left superior frontal lobe subcortical white matter subcentimeter diffusion heterogeneity on series 2, image 12 appears to be either T2 shine through or susceptibility associated with chronic hemosiderin there on SWI (series 7, image 19) which is new since 2023. No hyperdense hemorrhage there by CT 1 day earlier., however, there is trace abnormal increased FLAIR signal in the sulci there (series 6, image 5). No regional edema or mass effect. No IVH or other acute intracranial hemorrhage identified. Chronic microhemorrhage in the left cerebellum on series 7, image 85 is new since 2023. No other convincing restricted diffusion. No midline shift, mass effect, evidence of mass lesion, ventriculomegaly. Cervicomedullary junction and pituitary are within normal limits. Patchy encephalomalacia in the left cerebellum PICA  territory. Elsewhere stable gray and white matter signal compared to 2023. Vascular: Major intracranial vascular flow voids are stable since 2023 with occluded distal left vertebral artery (series 5, image 23). Skull and upper cervical spine: Visualized bone marrow signal is within normal limits. Partially visible cervical spine degeneration. Sinuses/Orbits: Stable. Chronic left maxillary sinus mucosal thickening and fluid level. Other: Stable mastoid aeration. Visible internal auditory structures appear normal. Negative visible scalp and face. IMPRESSION: 1. Positive for: - 1.5 cm acute lacunar infarct of the right thalamus. No hemorrhagic transformation there. No mass effect. - evidence of trace SAH left superior convexity (possible Heidelberg classification 3c: Subarachnoid hemorrhage.). Recommend repeat noncontrast Head CT to further evaluate. 2. No intracranial mass effect. No other acute intracranial abnormality. Chronic distal left vertebral artery occlusion and patchy chronic left cerebellar infarcts. The finding of possible SAH and recommendation for Head CT were communicated to Dr. MAGDALENO Blower at 3:58 am on 11/25/2023 by text page via the [facility] messaging system. Electronically Signed   By: VEAR Hurst M.D.   On: 11/25/2023 03:58   ECHOCARDIOGRAM COMPLETE Result Date: 11/24/2023    ECHOCARDIOGRAM REPORT   Patient Name:   LETCHER SCHWEIKERT Date of Exam: 11/24/2023 Medical Rec #:  [id number]         Height:       73.0 in Accession #:    [id number]        Weight:       193.3 lb Date of Birth:  10-01-1940         BSA:          2.121 m Patient Age:    83 years          BP:           137/75 mmHg Patient Gender: M                 HR:           61 bpm. Exam Location:  Inpatient Procedure: 2D Echo (Both Spectral and Color Flow Doppler were utilized during  procedure). Indications:    stroke  History:        Patient has prior history of Echocardiogram examinations, most                 recent 06/15/2021. CAD, Prior  CABG, chronic kidney disease; Risk                 Factors:Hypertension, Dyslipidemia and Diabetes.  Sonographer:    Tinnie Barefoot RDCS Referring Phys: [phone] MCNEILL P KIRKPATRICK IMPRESSIONS  1. Left ventricular ejection fraction, by estimation, is 60 to 65%. The left ventricle has normal function. The left ventricle has no regional wall motion abnormalities. There is mild left ventricular hypertrophy. Left ventricular diastolic parameters are consistent with Grade I diastolic dysfunction (impaired relaxation).  2. Right ventricular systolic function is mildly reduced. The right ventricular size is normal. There is normal pulmonary artery systolic pressure. The estimated right ventricular systolic pressure is 27.6 mmHg.  3. The mitral valve is grossly normal. Trivial mitral valve regurgitation.  4. The aortic valve is tricuspid. Aortic valve regurgitation is not visualized.  5. The inferior vena cava is normal in size with greater than 50% respiratory variability, suggesting right atrial pressure of 3 mmHg. Comparison(s): Changes from prior study are noted. 06/15/2021: LVEF 55-60%. FINDINGS  Left Ventricle: Left ventricular ejection fraction, by estimation, is 60 to 65%. The left ventricle has normal function. The left ventricle has no regional wall motion abnormalities. The left ventricular internal cavity size was normal in size. There is  mild left ventricular hypertrophy. Left ventricular diastolic parameters are consistent with Grade I diastolic dysfunction (impaired relaxation). Indeterminate filling pressures. Right Ventricle: The right ventricular size is normal. No increase in right ventricular wall thickness. Right ventricular systolic function is mildly reduced. There is normal pulmonary artery systolic pressure. The tricuspid regurgitant velocity is 2.48 m/s, and with an assumed right atrial pressure of 3 mmHg, the estimated right ventricular systolic pressure is 27.6 mmHg. Left Atrium: Left atrial  size was normal in size. Right Atrium: Right atrial size was normal in size. Pericardium: There is no evidence of pericardial effusion. Mitral Valve: The mitral valve is grossly normal. Trivial mitral valve regurgitation. Tricuspid Valve: The tricuspid valve is grossly normal. Tricuspid valve regurgitation is trivial. Aortic Valve: The aortic valve is tricuspid. Aortic valve regurgitation is not visualized. Pulmonic Valve: The pulmonic valve was grossly normal. Pulmonic valve regurgitation is trivial. Aorta: The aortic root and ascending aorta are structurally normal, with no evidence of dilitation. Venous: The inferior vena cava is normal in size with greater than 50% respiratory variability, suggesting right atrial pressure of 3 mmHg. IAS/Shunts: No atrial level shunt detected by color flow Doppler.  LEFT VENTRICLE PLAX 2D LVIDd:         5.20 cm   Diastology LVIDs:         3.30 cm   LV e' medial:    5.98 cm/s LV PW:         1.10 cm   LV E/e' medial:  12.7 LV IVS:        1.10 cm   LV e' lateral:   11.60 cm/s LVOT diam:     2.00 cm   LV E/e' lateral: 6.5 LV SV:         55 LV SV Index:   26 LVOT Area:     3.14 cm  RIGHT VENTRICLE            IVC RV Basal diam:  2.70 cm  IVC diam: 1.20 cm RV S prime:     8.16 cm/s TAPSE (M-mode): 1.0 cm LEFT ATRIUM             Index        RIGHT ATRIUM           Index LA diam:        3.80 cm 1.79 cm/m   RA Area:     15.80 cm LA Vol (A2C):   59.3 ml 27.96 ml/m  RA Volume:   38.40 ml  18.11 ml/m LA Vol (A4C):   53.5 ml 25.23 ml/m LA Biplane Vol: 60.4 ml 28.48 ml/m  AORTIC VALVE LVOT Vmax:   80.20 cm/s LVOT Vmean:  51.400 cm/s LVOT VTI:    0.175 m  AORTA Ao Root diam: 3.10 cm Ao Asc diam:  3.40 cm MITRAL VALVE               TRICUSPID VALVE MV Area (PHT): 3.51 cm    TR Peak grad:   24.6 mmHg MV Decel Time: 216 msec    TR Vmax:        248.00 cm/s MV E velocity: 75.80 cm/s MV A velocity: 80.50 cm/s  SHUNTS MV E/A ratio:  0.94        Systemic VTI:  0.18 m                             Systemic Diam: 2.00 cm Vinie Maxcy MD Electronically signed by Vinie Maxcy MD Signature Date/Time: 11/24/2023/1:58:34 PM    Final    CT ANGIO HEAD NECK W WO CM (CODE STROKE) Result Date: 11/23/2023 CLINICAL DATA:  Neuro deficit, acute, stroke suspected EXAM: CT ANGIOGRAPHY HEAD AND NECK WITH AND WITHOUT CONTRAST TECHNIQUE: Multidetector CT imaging of the head and neck was performed using the standard protocol during bolus administration of intravenous contrast. Multiplanar CT image reconstructions and MIPs were obtained to evaluate the vascular anatomy. Carotid stenosis measurements (when applicable) are obtained utilizing NASCET criteria, using the distal internal carotid diameter as the denominator. RADIATION DOSE REDUCTION: This exam was performed according to the departmental dose-optimization program which includes automated exposure control, adjustment of the mA and/or kV according to patient size and/or use of iterative reconstruction technique. CONTRAST:  75mL OMNIPAQUE  IOHEXOL  350 MG/ML SOLN COMPARISON:  CTA head/neck March 14, 23 FINDINGS: CTA NECK FINDINGS Aortic arch: Great vessel origins are patent without significant stenosis. Right carotid system: No evidence of dissection, stenosis (50% or greater), or occlusion. Left carotid system: No evidence of dissection, stenosis (50% or greater), or occlusion. Vertebral arteries: Right dominant. The right vertebral arteries patent without significant stenosis. The left vertebral artery is occluded at its distal V2 segment with poor/irregular opacification more proximally. Skeleton: No evidence of acute abnormality limited assessment. Other neck: No acute abnormality on limited assessment. Upper chest: Biapical pleuroparenchymal scarring. Review of the MIP images confirms the above findings CTA HEAD FINDINGS Anterior circulation: Bilateral intracranial ICAs, MCAs, and ACAs are patent. Moderate left A2 ACA stenosis. Severe right M2 MCA stenosis. Posterior  circulation: The left intradural vertebral artery is largely non-opacified. Patent right vertebral artery with mild distal stenosis. The basilar artery and bilateral posterior is are patent. Severe left and multifocal moderate to severe right P2 PCA stenosis. Venous sinuses: As permitted by contrast timing, patent. Review of the MIP images confirms the above findings IMPRESSION: 1. Chronically occluded left vertebral artery. 2. Severe left and moderate to severe  right P2 PCA stenosis. 3. Severe right M2 MCA stenosis. 4. Moderate left A2 ACA stenosis. 5.  Aortic Atherosclerosis (ICD10-I70.0). Electronically Signed   By: Gilmore GORMAN Molt M.D.   On: 11/23/2023 19:14   CT HEAD CODE STROKE WO CONTRAST Result Date: 11/23/2023 CLINICAL DATA:  Code stroke. Neuro deficit, acute, stroke suspected. EXAM: CT HEAD WITHOUT CONTRAST TECHNIQUE: Contiguous axial images were obtained from the base of the skull through the vertex without intravenous contrast. RADIATION DOSE REDUCTION: This exam was performed according to the departmental dose-optimization program which includes automated exposure control, adjustment of the mA and/or kV according to patient size and/or use of iterative reconstruction technique. COMPARISON:  Head CT 09/03/2022. FINDINGS: Brain: Generalized cerebral atrophy. Patchy and ill-defined hypoattenuation within the cerebral white matter, nonspecific but compatible with mild chronic small vessel ischemic disease. Redemonstrated small chronic infarcts within the left cerebellar hemisphere. There is no acute intracranial hemorrhage. No demarcated cortical infarct. No extra-axial fluid collection. No evidence of an intracranial mass. No midline shift. Vascular: No hyperdense vessel.  Atherosclerotic calcifications. Skull: No calvarial fracture or aggressive osseous lesion. Sinuses/Orbits: No mass or acute finding within the imaged orbits. Severe mucosal thickening within the left maxillary sinus. ASPECTS  [facility] Stroke Program Early CT Score) - Ganglionic level infarction (caudate, lentiform nuclei, internal capsule, insula, M1-M3 cortex): 7 - Supraganglionic infarction (M4-M6 cortex): 3 Total score (0-10 with 10 being normal): 10 No evidence of an acute intracranial abnormality. These results were communicated to Dr. Michaela at 6:48 pmon 8/22/2025by text page via the [facility] messaging system. IMPRESSION: 1.  No evidence of an acute intracranial abnormality. 2. Parenchymal atrophy and chronic small vessel ischemic disease. 3. Redemonstrated small chronic infarcts within the left cerebellar hemisphere. 4. Severe mucosal thickening within the left maxillary sinus. Electronically Signed   By: Rockey Childs D.O.   On: 11/23/2023 18:48       HISTORY OF PRESENT ILLNESS 83 y.o. patient with history of hereditary angioedema type II, A-fib after CABG, hypertension, hyperlipidemia, CAD status post CABG, MI, stroke, CHF, CKD, GERD, GI bleed, prostate cancer, hypothyroidism and diabetes was admitted with acute onset left-sided weakness.  HOSPITAL COURSE Patient was given TNK to treat his stroke.  He experienced some improvement in symptoms afterwards and was found to have a lacunar infarct of the right thalamus on MRI.  He is now stable and ready to be transferred to [facility] for rehabilitation.  Acute Ischemic Infarct:  right thalamic infarct s/p TNK  Etiology:  likely small vessel  vs intracranial atherosclerosis though hx of stress induced Afib post CABG Code Stroke CT head No acute abnormality.  CTA head & neck Severe left and moderate to severe right P2 PCA stenosis. Severe right M2 MCA stenosis. Moderate left A2 ACA stenosis. Chronically occluded left vertebral artery.  MRI  1.5 cm acute lacunar infarct of the right thalamus. No hemorrhagic transformation there. No mass effect. Evidence of trace SAH left superior convexity but on my personal review, this may also represent cortical small hemorrhage iso  TNK Repeat CT Head- Subtle but confirmed trace left vertex subarachnoid hemorrhage (sagittal image 43). No intracranial mass effect or other complicating features. Right thalamic infarct stable from MRI yesterday. 30-day cardiac monitor after discharge 2D Echo EF 60-65% LDL 106 HgbA1c 5.6 VTE prophylaxis - SCDs aspirin  81 mg daily prior to admission, now on aspirin  81mg  (prior bleeding on plavix ) Therapy recommendations: CIR Disposition: Discharged to rehab   Hx of Stroke/TIA 06/15/2021- Left PICA infarct likely secondary left  vertebral occlusion at the left V2  Discharged with plan for ASA 325 abd plavix  75mg  for 3 months and then plavix  alone. ASA decreased to 81mg  due to ecchymosis and stopped plavix  in 2023 due to bruising, he did continue the ASA 81mg  Pitavastatin  4mg  and zetia  10mg , pt stated intolerance with other statins (lipitor , crestor , pravastatin ). EF 55-60%. Follows with Dr. Edwyna at [facility]   Post op Paroxysmal Atrial fibrillation after CABG Continue telemetry monitoring Hx lower GI bleed (hemorrhoids and r/t cancer treatment) No hx of anticoagulation  Long term monitor in 2024- Patient had a min HR of 47 bpm, max HR of 130 bpm, and avg HR of 65 bpm.  Predominant underlying rhythm was Sinus Rhythm.  First Degree AV Block was present.  17 Supraventricular Tachycardia runs occurred, the run with the fastest interval lasting 7 beats with a max rate of 130 bpm, the longest lasting 16 beats with an avg rate of 111 bpm. Some episodes of Supraventricular Tachycardia may be possible Atrial Tachycardia with variable block. Isolated SVEs were rare (<1.0%), SVE Couplets were rare (<1.0%), and SVE Triplets were rare (<1.0%).  Isolated VEs were occasional (1.2%, 12241), VE Couplets were rare (<1.0%, 36), and no VE Triplets were present. Ventricular Bigeminy was present. Inverted QRS complexes possibly due to inverted placement of device.   CHF Hypertension Home meds:   Norvasc , losartan   Blood Pressure Goal: BP less than 180/105  Patient notes hypertension always worst in morning. STOPBANG score at least 5 therefore high risk of OSA -outpatient sleep study at discharge   CAD s/p stenting  Hyperlipidemia Home meds:  Pitavastatin  (nonformulary but will resume on discharge) Add zetia  back  LDL 106, goal < 70 Has previously documented intolerances to other statins (lipitor , crestor , pravastatin ) Start Leqvio as outpatient therapy   Other Stroke Risk Factors Coronary artery disease s/p CABG CHF   Other Active Problems Hereditary angioedema - patient medication (icatibant ) is in pharmacy fridge-patient had 1 episode of angioedema on 8/25 which was successfully treated with icatibant  CKD Cr 1.80, GFR 36 - appears stable  GERD Hypothyroidism Hx of LGIB, was told to related to prostate cancer radiation Hx of hematuria, was told to related to prostate cancer radiation Prostate cancer status post radiation  RN Pressure Injury Documentation:     DISCHARGE EXAM  PHYSICAL EXAM General:  Alert, well-nourished, well-developed patient in no acute distress Psych:  Mood and affect appropriate for situation CV: Regular rate and rhythm on monitor Respiratory:  Regular, unlabored respirations on room air  NEURO:  Mental Status: AA&Ox3  Speech/Language: speech is without dysarthria or aphasia.   Cranial Nerves:  II: PERRL. Visual fields full.  III, IV, VI: EOMI. Eyelids elevate symmetrically.  V: Sensation is intact to light touch and symmetrical to face.  VII: Smile is symmetrical.  VIII: hearing intact to voice. IX, X: Phonation is normal.  XII: tongue is midline without fasciculations. Motor: Able to move all 4 extremities with good antigravity strength, but diminished fine finger movements in the left hand and right arm orbits left, right leg stronger than left Tone: is normal and bulk is normal Sensation- Intact to light touch bilaterally.   Coordination: FTN with slight ataxia on the left Gait- deferred  1a Level of Conscious.: 0 1b LOC Questions: 0 1c LOC Commands: 0 2 Best Gaze: 0 3 Visual: 0 4 Facial Palsy: 0 5a Motor Arm - left: 0 5b Motor Arm - Right: 0 6a Motor Leg - Left: 0 6b  Motor Leg - Right: 0 7 Limb Ataxia: 1 8 Sensory: 0 9 Best Language: 0 10 Dysarthria: 0 11 Extinct. and Inatten.: 0 TOTAL: 1   Discharge Diet       Diet   Diet Heart Room service appropriate? Yes; Fluid consistency: Thin   liquids  DISCHARGE PLAN Disposition: Rehab aspirin  81 mg daily for secondary stroke prevention Ongoing stroke risk factor control by Primary Care Physician at time of discharge Follow-up PCP [name], [name], NP in 2 weeks. Follow-up in [facility] in 8 weeks, office to schedule an appointment.   35 minutes were spent preparing discharge.  Cortney E Everitt Clint Kill , MSN, AGACNP-BC [facility] See Amion for schedule and pager information 11/27/2023 11:50 AM  I have personally obtained history,examined this patient, reviewed notes, independently viewed imaging studies, participated in medical decision making and plan of care.ROS completed by me personally and pertinent positives fully documented  I have made any additions or clarifications directly to the above note. Agree with note above.    Eather Popp, MD Medical Director [facility] Pager: [phone] 11/27/2023 1:59 PM

## 2023-11-27 NOTE — Progress Notes (Signed)
 Inpatient Rehab Admissions Coordinator:   I have a bed available for this patient to admit to CIR today.  Stroke service in agreement and TOC aware.  I spoke with pt and his daughter and they are in agreement to proceed with admit today.  I will make arrangements.   [name], PT, DPT Admissions Coordinator [phone] 11/27/23  11:13 AM

## 2023-11-27 NOTE — H&P (Addendum)
 Physical Medicine and Rehabilitation Admission H&P        Chief Complaint  Patient presents with   Functional deficits due to CVA   : HPI: Daniel Babers Montgomeryis a 83 year old male with PMHx of hereditary angioedema type II, A-fib after CABG, hypertension, hyperlipidemia, CAD status post CABG, MI, stroke, CHF, CKD, GERD, GI bleed, prostate cancer, hypothyroidism and diabetes presented to the [facility] ER on 8/22 with complaints of acute onset of left-sided weakness. The patient reported that he was sitting in a chair eating a sandwich when he suddenly found himself unable to get up to go to the bathroom. He experienced numbness and an inability to use his left leg, which led to a fall. His family promptly transported him to the emergency department. On initial evaluation, his NIH Stroke  was 9, indicating left-sided facial droop, left arm weakness, left leg weakness, ataxia in both arms, decreased sensation on the left, and dysarthria. A CT of the head showed no acute process, and a CTA head & neck Severe left and moderate to severe right P2 PCA stenosis. Severe right M2 MCA stenosis. Moderate left A2 ACA stenosis. Chronically occluded left vertebral artery. An MRI revealed a 1.5 cm acute lacunar infarct in the right thalamus without hemorrhage or mass effect. Additionally, there was evidence of trace subarachnoid hemorrhage [facility]) at the left superior convexity. Patient was considered an appropriate candidate for TNK, which was administered. Repeat CT Head- Subtle but confirmed trace left vertex subarachnoid hemorrhage (sagittal image 43). No intracranial mass effect or other complicating features. He was placed on Asprin alone due to prior hx of bleeding on Plavix .  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 He currently requires moderate assistance with mobility and basic activities of daily living (ADLs). Therapy evaluations  completed due to patient decreased functional mobility was admitted for a comprehensive rehab program.        Review of Systems  Constitutional: Negative.   HENT: Negative.    Eyes: Negative.   Respiratory: Negative.    Cardiovascular:  Negative for chest pain, palpitations and leg swelling.  Gastrointestinal: Negative.        LBM 8/25   Genitourinary: Negative.   Musculoskeletal:  Positive for joint pain and neck pain.       Neck tension and pain   Skin: Negative.        Multiple bruises to bilat UE.   Neurological:  Positive for weakness. Negative for tingling, tremors, seizures and headaches.  Endo/Heme/Allergies:  Positive for environmental allergies.  Psychiatric/Behavioral:  The patient has insomnia.        Past Medical History:  Diagnosis Date   Abnormal EKG 02/21/2015   Acute lower GI bleeding 05/28/2019   Anginal pain (HCC) 2016   Angio-edema     Anxiety     Arthritis     CAD S/P percutaneous coronary angioplasty      NSTEMI treated with LAD PCI with DES x 03 Feb 2015 Myoview low risk Nov 2017   Cataract     Cerebellar stroke (HCC) 06/14/2021   CHF (congestive heart failure) (HCC) 2020    after surgery only   Chronic diarrhea of unknown origin     CKD (chronic kidney disease) stage 3, GFR 30-59 ml/min 12/21/2017   Colitis 05/23/2015   Coronary artery disease 05/28/2019   Diverticular hemorrhage     Diverticulosis     Dyslipidemia, goal  LDL below 70 02/21/2015    hyperlipidemia    Dysrhythmia      A-fib   Enterotoxigenic Escherichia coli infection 05/24/2015   Essential hypertension 02/21/2015   Geographic tongue     GERD (gastroesophageal reflux disease)     GERD without esophagitis 06/15/2021   GI bleed 12/21/2017   Glossitis, benign migratory 12/04/2018   Gout     Hematochezia 12/21/2017    Admitted Sept 2019 with lower GI bleeding felt to be secondary to hemorrhoids   Hereditary angioedema type 2 (HCC) 07/28/2017   High cholesterol     History of  kidney stones     History of non-ST elevation myocardial infarction (NSTEMI) 02/22/2015    Nov 2016   History of prolonged Q-T interval on ECG     History of prostate cancer     Hoarseness 12/04/2018   Hx of Clostridium difficile infection     Hx of umbilical hernia repair     Hypertension     Hypothyroidism     Hypothyroidism, unspecified 06/15/2021   ILD (interstitial lung disease) (HCC) 12/17/2013    ?related to chicken farm exposure or aspiration pneumonitis from GERD Previously attributed to acute interstitial pneumonitis but based on HRCT this is likely IPF, probable UIP   Lower GI bleed 06/03/2019   Malignant neoplasm of prostate (HCC) 08/07/2014   Myocardial infarction (HCC) 2016   NSTEMI (non-ST elevated myocardial infarction) (HCC) 05/21/2018   Open metatarsal fracture 09/02/2020   PAF (paroxysmal atrial fibrillation) (HCC) 06/13/2018    Post CABG- discharged on Amiodarone , he will probably not need this long term   Palpitations 03/15/2015    palpitations    Peripheral vascular disease (HCC)      vericose veins   Personal history of digestive disease      gastric ulcer   Pre-diabetes     Pre-operative clearance 01/02/2018   Prostate cancer (HCC)     Prostate enlargement     S/P CABG x 3 05/28/2018     LIMA-LAD, RIMA-RCA, and Lt radial to OM.   Skin cancer      basal cell carcinoma   STEC (Shiga toxin-producing Escherichia coli) infection 05/24/2015   Stroke (cerebrum) (HCC) 06/15/2021   Tachycardia determined by examination of pulse     Thyroid  disease     Type 2 diabetes mellitus with unspecified complications (HCC)     Type 2 diabetes mellitus without complications (HCC) 06/15/2021   Urticaria               Past Surgical History:  Procedure Laterality Date   APPENDECTOMY       BACK SURGERY       CARDIAC CATHETERIZATION N/A 02/21/2015    Procedure: Left Heart Cath;  Surgeon: Dorn JINNY Lesches, MD;  Location: [facility] INVASIVE CV LAB;  Service: Cardiovascular;   Laterality: N/A;   CATARACT EXTRACTION, BILATERAL       CORONARY ARTERY BYPASS GRAFT N/A 05/24/2018    Procedure: CORONARY ARTERY BYPASS GRAFTING (CABG), ON PUMP, TIMES THREE, USING BILATERAL INTERNAL MAMMARY ARTERY AND HARVESTED LEFT RADIAL ARTERY;  Surgeon: Lucas Dorise POUR, MD;  Location: [facility] OR;  Service: Open Heart Surgery;  Laterality: N/A;   CYSTOSCOPY WITH LITHOLAPAXY N/A 07/26/2020    Procedure: CYSTOSCOPY WITH LITHOLAPAXY WITH FULGERATION;  Surgeon: Renda Glance, MD;  Location: [facility] ORS;  Service: Urology;  Laterality: N/A;   HOLMIUM LASER APPLICATION N/A 07/26/2020    Procedure: HOLMIUM LASER APPLICATION;  Surgeon: Renda Glance, MD;  Location: [facility];  Service: Urology;  Laterality: N/A;   I & D EXTREMITY Right 09/02/2020    Procedure: IRRIGATION AND DEBRIDEMENT RIGHT 2ND AND 3RD METATARSAL;  Surgeon: Kit Rush, MD;  Location: [facility] OR;  Service: Orthopedics;  Laterality: Right;   LEFT HEART CATH AND CORONARY ANGIOGRAPHY N/A 05/22/2018    Procedure: LEFT HEART CATH AND CORONARY ANGIOGRAPHY;  Surgeon: Dann Candyce RAMAN, MD;  Location: [facility] INVASIVE CV LAB;  Service: Cardiovascular;  Laterality: N/A;   NASAL SINUS SURGERY       ORIF TOE FRACTURE Right 09/02/2020    Procedure: OPEN REDUCTION INTERNAL FIXATION (ORIF) OF RIGHT OPEN 2ND AND 3RD METATARSAL (TOE) OPEN FRACTURE;  Surgeon: Kit Rush, MD;  Location: [facility] OR;  Service: Orthopedics;  Laterality: Right;   PROSTATE BIOPSY       RADIAL ARTERY HARVEST Left 05/24/2018    Procedure: RADIAL ARTERY HARVEST;  Surgeon: Lucas Dorise POUR, MD;  Location: [facility] OR;  Service: Open Heart Surgery;  Laterality: Left;   right shoulder rotator cuff repair       TEE WITHOUT CARDIOVERSION N/A 05/24/2018    Procedure: TRANSESOPHAGEAL ECHOCARDIOGRAM (TEE);  Surgeon: Lucas Dorise POUR, MD;  Location: [facility] OR;  Service: Open Heart Surgery;  Laterality: N/A;             Family History  Problem Relation Age of Onset   Cancer Mother     Stomach cancer Mother      Cancer Father          prostate   Cancer Sister     Sleep apnea Neg Hx          Social History:  reports that he has never smoked. He has never used smokeless tobacco. He reports that he does not drink alcohol  and does not use drugs. Allergies:  Allergies       Allergies  Allergen Reactions   Zestril  [Lisinopril ] Anaphylaxis and Swelling      Face, lips, and throat swell   Buspirone  Other (See Comments)      Didn't like how it made him feel   Roxicodone  [Oxycodone ] Other (See Comments)      Hallucinations   Tape Other (See Comments)      PLASTIC TAPE TEARS OFF THE SKIN- Please use an alternative!!   Benadryl [Diphenhydramine Hcl] Swelling and Other (See Comments)      Facial swelling from high dose (tolerates Advil  PM as needed)   Darvocet [Propoxyphene N-Acetaminophen ] Other (See Comments)      Hallucinations     Darvon [Propoxyphene Hcl] Other (See Comments)      Hallucination     Norvasc  [Amlodipine ] Palpitations, Rash and Other (See Comments)      Heart pounding Headache  Rash on ankles   Tylenol  [Acetaminophen ] Swelling and Other (See Comments)      Foot swelling            Medications Prior to Admission  Medication Sig Dispense Refill   albuterol  (VENTOLIN  HFA) 108 (90 Base) MCG/ACT inhaler Inhale 2 puffs into the lungs. Every 4-6 hours as needed       amLODipine  (NORVASC ) 2.5 MG tablet TAKE 1 TABLET BY MOUTH EVERY DAY 90 tablet 2   aspirin  81 MG EC tablet Take 1 tablet (81 mg total) by mouth daily. 30 tablet 11   Azelastine  HCl 137 MCG/SPRAY SOLN Place 1 spray into both nostrils 2 (two) times daily.       betamethasone  valerate ointment (VALISONE ) 0.1 % Apply 1 application  topically 2 (two) times daily as needed for rash.       Cyanocobalamin  (VITAMIN B-12 PO) Take 1 tablet by mouth daily.       fluoruracil (CARAC ) 0.5 % cream Apply to skin cancer for 1-7 days during 'flare up' as directed. 30 g 3   fluticasone  (FLONASE ) 50 MCG/ACT nasal spray Place 1 spray into  both nostrils 2 (two) times daily.       Guaifenesin  (MUCINEX  MAXIMUM STRENGTH) 1200 MG TB12 Take 1,200 mg by mouth at bedtime.       icatibant  Acetate (FIRAZYR ) 30 MG/3ML injection Inject 30 mg into the skin once as needed (HAE).       levothyroxine  (SYNTHROID ) 100 MCG tablet Take 100 mcg by mouth daily before breakfast.       losartan  (COZAAR ) 25 MG tablet Take 1 tablet (25 mg total) by mouth daily. (Patient taking differently: Take 25 mg by mouth in the morning and at bedtime.) 90 tablet 1   metoprolol  succinate (TOPROL -XL) 25 MG 24 hr tablet TAKE 1 TABLET BY MOUTH TWICE A DAY 180 tablet 0   Multiple Vitamins-Minerals (MULTIVITAMIN MEN 50+) TABS Take 1 tablet by mouth daily.       nabumetone  (RELAFEN ) 750 MG tablet Take 750 mg by mouth daily.       nitroGLYCERIN  (NITROSTAT ) 0.4 MG SL tablet Place 0.4 mg under the tongue every 5 (five) minutes as needed for chest pain.       OVER THE COUNTER MEDICATION Take 1 capsule by mouth in the morning and at bedtime. Health Plus/Super Colon Cleanse       pantoprazole  (PROTONIX ) 40 MG tablet Take 40 mg by mouth 2 (two) times daily.       Pitavastatin  Calcium  (LIVALO ) 4 MG TABS Take 4 mg by mouth at bedtime.       Probiotic Product (PROBIOTIC DAILY PO) Take 1 capsule by mouth daily.       promethazine-dextromethorphan  (PROMETHAZINE-DM) 6.25-15 MG/5ML syrup TAKE 5-10 MLS ( 1-2 TEASPOONFULS) EVERY FOUR-SIX HOURS AS NEEDED FOR COUGH       TAKHZYRO  300 MG/2ML SOSY INJECT 300MG  SUBCUTANEOUSLY  EVERY 2 WEEKS (Patient taking differently: Inject 300 mg into the skin See admin instructions. Inject 300mg  into the skin every other Friday.) 4 mL 11   temazepam  (RESTORIL ) 15 MG capsule Take 15 mg by mouth at bedtime.                  Home: Home Living Family/patient expects to be discharged to:: Private residence Living Arrangements: Spouse/significant other Available Help at Discharge: Family, Available 24 hours/day Type of Home: House Home Access: Stairs to  enter, Ramped entrance Home Layout: One level Bathroom Shower/Tub: Health visitor: Handicapped height Bathroom Accessibility: Yes Home Equipment: Agricultural consultant (2 wheels), Shower seat, Grab bars - toilet, Grab bars - tub/shower  Lives With: Spouse   Functional History: Prior Function Prior Level of Function : Independent/Modified Independent Mobility Comments: was not using AD PTA ADLs Comments: independent   Functional Status:  Mobility: Bed Mobility Overal bed mobility: Needs Assistance Bed Mobility: Supine to Sit Supine to sit: Supervision, HOB elevated, Used rails Sit to supine: Min assist General bed mobility comments: mod A for balance coming up to sitting. Pt able to return to supine and get LLE into bed but min A needed to position Transfers Overall transfer level: Needs assistance Equipment used: Rolling walker (2 wheels) Transfers: Sit to/from Stand Sit to Stand: Min assist Bed to/from chair/wheelchair/BSC  transfer type:: Step pivot Step pivot transfers: Min assist General transfer comment: light rise and steady assist, cues for hand placement when rising and sitting. sit<>stand x5 throughout session Ambulation/Gait Ambulation/Gait assistance: Min assist Gait Distance (Feet): 30 Feet (+15+15 to and from bathroom) Assistive device: Rolling walker (2 wheels) Gait Pattern/deviations: Step-to pattern, Decreased step length - left, Decreased dorsiflexion - left, Ataxic, Narrow base of support General Gait Details: assist for steadying pt and RW, RW management, max cues for heel strike and widening BOS with LLE Gait velocity: decr   ADL: ADL Overall ADL's : Needs assistance/impaired Eating/Feeding: Sitting, Set up Grooming: Wash/dry hands, Oral care, Set up, Sitting Upper Body Bathing: Sitting, Minimal assistance Lower Body Bathing: Sit to/from stand, Moderate assistance Upper Body Dressing : Sitting, Minimal assistance Lower Body Dressing: Sit  to/from stand, Moderate assistance Toilet Transfer: Ambulation, Rolling walker (2 wheels), Minimal assistance Functional mobility during ADLs: Minimal assistance, Rolling walker (2 wheels)   Cognition: Cognition Overall Cognitive Status: Within Functional Limits for tasks assessed Arousal/Alertness: Awake/alert Orientation Level: Oriented X4 Year: 2025 Month: August Day of Week: Correct Attention: Focused, Alternating, Sustained Focused Attention: Appears intact Sustained Attention: Appears intact Alternating Attention: Impaired Alternating Attention Impairment: Functional complex Memory: Impaired Memory Impairment: Decreased short term memory Decreased Short Term Memory: Verbal complex Awareness: Appears intact Problem Solving: Appears intact Executive Function: Organizing Organizing: Appears intact Safety/Judgment: Appears intact Cognition Arousal: Alert Behavior During Therapy: WFL for tasks assessed/performed Overall Cognitive Status: Within Functional Limits for tasks assessed   Physical Exam: Blood pressure 115/77, pulse 82, temperature 97.7 F (36.5 C), temperature source Oral, resp. rate 16, height 6' 1 (1.854 m), weight 87.7 kg, SpO2 98%. Physical Exam HENT:     Head: Normocephalic.     Right Ear: Ear canal and external ear normal.     Left Ear: Ear canal and external ear normal.     Nose: Nose normal.     Mouth/Throat:     Mouth: Mucous membranes are moist.     Pharynx: Oropharynx is clear.  Eyes:     Extraocular Movements: Extraocular movements intact.     Conjunctiva/sclera: Conjunctivae normal.     Pupils: Pupils are equal, round, and reactive to light.  Cardiovascular:     Rate and Rhythm: Normal rate and regular rhythm.     Heart sounds: No murmur heard.    No gallop.  Pulmonary:     Effort: Pulmonary effort is normal. No respiratory distress.     Breath sounds: Normal breath sounds. No wheezing.  Abdominal:     General: Bowel sounds are normal.  There is no distension.     Tenderness: There is no abdominal tenderness.  Musculoskeletal:        General: No swelling or tenderness. Normal range of motion.     Cervical back: Normal range of motion.  Skin:    General: Skin is warm.     Findings: Bruising present.  Neurological:     Mental Status: He is alert.     Comments: Pt is alert and oriented to person, place, date, reason. Reasonable insight and awareness. STM seems intact. CN exam non-focal. MMT: RUE 5/5 prox to distal. LUE 4+/5 prox to distal. RLE 4/5 prox to 5/5 distally. LLE 4 prox to 4+/5 distally. Sl decrease in LT in both feet as well as left arm. + PD LUE. Sl limb ataxia LUE. Intact HTS LLE.  DTR's 1 to 2+. No abnl resting tone.   Psychiatric:  Mood and Affect: Mood normal.        Behavior: Behavior normal.       Lab Results Last 48 Hours        Results for orders placed or performed during the hospital encounter of 11/23/23 (from the past 48 hours)  Glucose, capillary     Status: Abnormal    Collection Time: 11/25/23 11:52 AM  Result Value Ref Range    Glucose-Capillary 119 (H) 70 - 99 mg/dL      Comment: Glucose reference range applies only to samples taken after fasting for at least 8 hours.  Glucose, capillary     Status: Abnormal    Collection Time: 11/25/23  4:01 PM  Result Value Ref Range    Glucose-Capillary 131 (H) 70 - 99 mg/dL      Comment: Glucose reference range applies only to samples taken after fasting for at least 8 hours.  Glucose, capillary     Status: Abnormal    Collection Time: 11/25/23  9:13 PM  Result Value Ref Range    Glucose-Capillary 108 (H) 70 - 99 mg/dL      Comment: Glucose reference range applies only to samples taken after fasting for at least 8 hours.    Comment 1 Notify RN      Comment 2 Document in Chart    Glucose, capillary     Status: Abnormal    Collection Time: 11/26/23  6:11 AM  Result Value Ref Range    Glucose-Capillary 127 (H) 70 - 99 mg/dL      Comment:  Glucose reference range applies only to samples taken after fasting for at least 8 hours.    Comment 1 Notify RN      Comment 2 Document in Chart    Glucose, capillary     Status: Abnormal    Collection Time: 11/26/23 11:31 AM  Result Value Ref Range    Glucose-Capillary 256 (H) 70 - 99 mg/dL      Comment: Glucose reference range applies only to samples taken after fasting for at least 8 hours.  Glucose, capillary     Status: Abnormal    Collection Time: 11/26/23  4:34 PM  Result Value Ref Range    Glucose-Capillary 140 (H) 70 - 99 mg/dL      Comment: Glucose reference range applies only to samples taken after fasting for at least 8 hours.  Glucose, capillary     Status: Abnormal    Collection Time: 11/26/23  9:03 PM  Result Value Ref Range    Glucose-Capillary 162 (H) 70 - 99 mg/dL      Comment: Glucose reference range applies only to samples taken after fasting for at least 8 hours.    Comment 1 Notify RN      Comment 2 Document in Chart    CBC     Status: Abnormal    Collection Time: 11/27/23  4:35 AM  Result Value Ref Range    WBC 7.6 4.0 - 10.5 K/uL    RBC 4.05 (L) 4.22 - 5.81 MIL/uL    Hemoglobin 12.9 (L) 13.0 - 17.0 g/dL    HCT 62.7 (L) 60.9 - 52.0 %    MCV 91.9 80.0 - 100.0 fL    MCH 31.9 26.0 - 34.0 pg    MCHC 34.7 30.0 - 36.0 g/dL    RDW 87.5 88.4 - 84.4 %    Platelets 174 150 - 400 K/uL    nRBC 0.0 0.0 - 0.2 %  Comment: Performed at [facility], [address] [place], [place] [address]  Basic metabolic panel with GFR     Status: Abnormal    Collection Time: 11/27/23  4:35 AM  Result Value Ref Range    Sodium 136 135 - 145 mmol/L    Potassium 3.8 3.5 - 5.1 mmol/L    Chloride 105 98 - 111 mmol/L    CO2 22 22 - 32 mmol/L    Glucose, Bld 111 (H) 70 - 99 mg/dL      Comment: Glucose reference range applies only to samples taken after fasting for at least 8 hours.    BUN 33 (H) 8 - 23 mg/dL    Creatinine, Ser 8.16 (H) 0.61 - 1.24 mg/dL    Calcium  9.1 8.9  - 10.3 mg/dL    GFR, Estimated 36 (L) >60 mL/min      Comment: (NOTE) Calculated using the CKD-EPI Creatinine Equation (2021)      Anion gap 9 5 - 15      Comment: Performed at [facility], [address] [place], [place] [address]  Glucose, capillary     Status: Abnormal    Collection Time: 11/27/23  6:15 AM  Result Value Ref Range    Glucose-Capillary 126 (H) 70 - 99 mg/dL      Comment: Glucose reference range applies only to samples taken after fasting for at least 8 hours.    Comment 1 Notify RN      Comment 2 Document in Chart        Imaging Results (Last 48 hours)  No results found.         Blood pressure 115/77, pulse 82, temperature 97.7 F (36.5 C), temperature source Oral, resp. rate 16, height 6' 1 (1.854 m), weight 87.7 kg, SpO2 98%.   Medical Problem List and Plan: 1. Functional deficits secondary to right thalamic infarct, small SAH post CABG             -he has made nice neurological gains, but still with sensory/propioceptive deficits and ataxia.              -patient may shower             -ELOS/Goals: 5-7 days, mod I to supervision 2.  Antithrombotics: -DVT/anticoagulation:  Mechanical: Sequential compression devices, below knee Bilateral lower extremities             -antiplatelet therapy: Aspirin  81 mg (hx of bleeding on plavix )  3. Pain Management: Arthritis bilat hands and ankles: request home med nabumetone  750 mg---will resume 4. Mood/Behavior/Sleep: LCSW to follow for evaluation and support when available.              -antipsychotic agents:  Does not want Buspar  makes me feel horrible              -Insomnia: Requesting home Temazepam  15 mg for insomnia-ordered   -pt reported that sleep study was performed last night. I see no record of study nor do I see mention of it by medical team. Will ask NP to look in to.  5. Neuropsych/cognition: This patient is capable of making decisions on his own behalf. 6. Skin/Wound Care: routine pressure  relief measures  7. Fluids/Electrolytes/Nutrition: monitor I&O and follow up chemistries              -Heart healthy diet   8. Hereditary angioedema: Patient supplied med (icatibant ) is in pharmacy fridge-patient had 1 episode of angioedema on  8/25 which was successfully treated with icatibant  PRN.   -uses takhzyro  inj sq every 2 weeks at home 9. CAD s/p stenting HLD: LDL 106 on Pitavastatin  4 mg daily and Zetia               -Start Leqvio as outpatient therapy  -see meds below for PAF/HTN/CHF  -nitro prn CP 10. HTN/CHF: norvasc  and losartan    -bp controlled at present  -bp goal of less than 180/105 11. T2DM: A1c 5.6  12. Post op PAF after CABG  -HR now controlled, regular  -metoprolol  xl 25mg  daily,  13. CKD IIIb. Cr has been stable around  1.8  14. Hx of prostate cancer/hematuria 15. Hx hypothyroid--levothyroxine   16. Hx of interstitial lung disease/allergies  -albuterol  prn  -flonase  and azelastine  nasal sprays bid     Daphne LITTIE Finders, NP 11/27/2023  I have personally performed a face to face diagnostic evaluation of this patient and formulated the key components of the plan.  Additionally, I have personally reviewed laboratory data, imaging studies, as well as relevant notes and concur with the physician assistant's documentation above.  The patient's status has not changed from the original H&P.  Any changes in documentation from the acute care chart have been noted above.  Arthea IVAR Gunther, MD, LEELLEN

## 2023-11-27 NOTE — Progress Notes (Signed)
 Babs Arthea DASEN, MD  Physician Physical Medicine and Rehabilitation   PMR Pre-admission    Signed   Date of Service: 11/26/2023  6:05 PM  Related encounter: ED to Hosp-Admission (Current) from 11/23/2023 in [place] [facility]   Signed     Expand All Collapse All  PMR Admission Coordinator Pre-Admission Assessment   Patient: Daniel Herrera is an 83 y.o., male MRN: [id number] DOB: 04-27-1940 Height: 6' 1 (185.4 cm) Weight: 87.7 kg   Insurance Information HMO:     PPO:      PCP:      IPA:      80/20:      OTHER:  PRIMARY: Medicare part A and B      Policy#: [id number]      Subscriber: self CM Name:       Phone#:      Fax#:  Pre-Cert#:       Employer: Retired Benefits:  Phone #:       Name: Checked in passport one source [employer]. Date: 07/03/2003      Deduct: $1676      Out of Pocket Max: none      Life Max: n/a CIR: 100%      SNF: 100 days Outpatient: 80%     Co-Pay: 20% Home Health: 100%      Co-Pay: none DME: 80%     Co-Pay: 20% Providers: patient's choice   SECONDARY:       Policy#:      Phone#:    Artist:       Phone#:    The Engineer, materials Information Summary" for patients in Inpatient Rehabilitation Facilities with attached "Privacy Act Statement-Health Care Records" was provided and verbally reviewed with: Patient   Emergency Contact Information Contact Information       Name Relation Home Work Mobile    Scotten,Renita Daughter [phone]   [phone]    Gough,Annie Spouse [phone]             Other Contacts   None on File        Current Medical History  Patient Admitting Diagnosis: R CVA, SAH   History of Present Illness: An 83 y.o. male with hx of history of hereditary angioedema type II, PAF on ASA, HTN, HLD, CAD s/p CABG, MI, prior stroke, CHF, CKD, GERD, hx of GI bleed, history of prostate cancer, hypothyroidism, diabetes who presents to [facility] ED via private vehicle for acute onset of left-sided  weakness.  Last known well 1515.  Patient states he last had a sandwich around 3:00, then was sitting in the chair and was unable to get up to the chair to go to the bathroom and complained of left leg numbness and inability to use his left leg.  He did experience a fall due to left leg weakness.  Family drove him to the emergency room at [facility] on 11/23/23.  NIH 9 for left facial droop, left arm and leg weakness, ataxia in both left arm and leg, decreased sensation on the left, dysarthria.  CT head with no acute process.  CT angio head and neck negative for LVO.  Patient was deemed an appropriate candidate for TNK and TNK was administered.  MRI positive for: - 1.5 cm acute lacunar infarct of the right thalamus. No hemorrhagic transformation there. No mass effect.- evidence of trace SAH left superior convexity (possible Heidelberg classification 3c: Subarachnoid hemorrhage.  PT/OT evaluations completed with recommendations for acute  inpatient rehab admission.   Complete NIHSS TOTAL: 3   Patient's medical record from [facility] has been reviewed by the rehabilitation admission coordinator and physician.   Past Medical History      Past Medical History:  Diagnosis Date   Abnormal EKG 02/21/2015   Acute lower GI bleeding 05/28/2019   Anginal pain (HCC) 2016   Angio-edema     Anxiety     Arthritis     CAD S/P percutaneous coronary angioplasty      NSTEMI treated with LAD PCI with DES x 03 Feb 2015 Myoview low risk Nov 2017   Cataract     Cerebellar stroke (HCC) 06/14/2021   CHF (congestive heart failure) (HCC) 2020    after surgery only   Chronic diarrhea of unknown origin     CKD (chronic kidney disease) stage 3, GFR 30-59 ml/min 12/21/2017   Colitis 05/23/2015   Coronary artery disease 05/28/2019   Diverticular hemorrhage     Diverticulosis     Dyslipidemia, goal LDL below 70 02/21/2015    hyperlipidemia    Dysrhythmia      A-fib   Enterotoxigenic Escherichia coli  infection 05/24/2015   Essential hypertension 02/21/2015   Geographic tongue     GERD (gastroesophageal reflux disease)     GERD without esophagitis 06/15/2021   GI bleed 12/21/2017   Glossitis, benign migratory 12/04/2018   Gout     Hematochezia 12/21/2017    Admitted Sept 2019 with lower GI bleeding felt to be secondary to hemorrhoids   Hereditary angioedema type 2 (HCC) 07/28/2017   High cholesterol     History of kidney stones     History of non-ST elevation myocardial infarction (NSTEMI) 02/22/2015    Nov 2016   History of prolonged Q-T interval on ECG     History of prostate cancer     Hoarseness 12/04/2018   Hx of Clostridium difficile infection     Hx of umbilical hernia repair     Hypertension     Hypothyroidism     Hypothyroidism, unspecified 06/15/2021   ILD (interstitial lung disease) (HCC) 12/17/2013    ?related to chicken farm exposure or aspiration pneumonitis from GERD Previously attributed to acute interstitial pneumonitis but based on HRCT this is likely IPF, probable UIP   Lower GI bleed 06/03/2019   Malignant neoplasm of prostate (HCC) 08/07/2014   Myocardial infarction [facility]) 2016   NSTEMI (non-ST elevated myocardial infarction) (HCC) 05/21/2018   Open metatarsal fracture 09/02/2020   PAF (paroxysmal atrial fibrillation) (HCC) 06/13/2018    Post CABG- discharged on Amiodarone , he will probably not need this long term   Palpitations 03/15/2015    palpitations    Peripheral vascular disease (HCC)      vericose veins   Personal history of digestive disease      gastric ulcer   Pre-diabetes     Pre-operative clearance 01/02/2018   Prostate cancer (HCC)     Prostate enlargement     S/P CABG x 3 05/28/2018     LIMA-LAD, RIMA-RCA, and Lt radial to OM.   Skin cancer      basal cell carcinoma   STEC (Shiga toxin-producing Escherichia coli) infection 05/24/2015   Stroke (cerebrum) (HCC) 06/15/2021   Tachycardia determined by examination of pulse      Thyroid  disease     Type 2 diabetes mellitus with unspecified complications (HCC)     Type 2 diabetes mellitus without complications (HCC) 06/15/2021   Urticaria  Has the patient had major surgery during 100 days prior to admission? No   Family History   family history includes Cancer in his father, mother, and sister; Stomach cancer in his mother.   Current Medications  Current Medications    Current Facility-Administered Medications:    amLODipine  (NORVASC ) tablet 2.5 mg, 2.5 mg, Oral, Daily, Shafer, Devon, NP, 2.5 mg at 11/27/23 1003   artificial tears ophthalmic solution 1 drop, 1 drop, Both Eyes, PRN, Remi, Devon, NP, 1 drop at 11/25/23 2131   aspirin  EC tablet 81 mg, 81 mg, Oral, Daily, Shafer, Devon, NP, 81 mg at 11/27/23 1004   busPIRone  (BUSPAR ) tablet 5 mg, 5 mg, Oral, TID PRN, Remi Pippin, NP   Chlorhexidine  Gluconate Cloth 2 % PADS 6 each, 6 each, Topical, Daily, Michaela Aisha SQUIBB, MD, 6 each at 11/24/23 1145   ezetimibe  (ZETIA ) tablet 10 mg, 10 mg, Oral, Daily, Remi, Pippin, NP, 10 mg at 11/27/23 1004   fluticasone  (FLONASE ) 50 MCG/ACT nasal spray 1 spray, 1 spray, Each Nare, BID, Remi Pippin, NP, 1 spray at 11/27/23 9349   hydrALAZINE  (APRESOLINE ) injection 10-20 mg, 10-20 mg, Intravenous, Q4H PRN, Bui, Xem M, MD   labetalol  (NORMODYNE ) injection 10-20 mg, 10-20 mg, Intravenous, Q2H PRN, Bui, Xem M, MD   levothyroxine  (SYNTHROID ) tablet 100 mcg, 100 mcg, Oral, QAC breakfast, Remi, Pippin, NP, 100 mcg at 11/27/23 9359   losartan  (COZAAR ) tablet 25 mg, 25 mg, Oral, Daily, Shafer, Devon, NP, 25 mg at 11/27/23 1004   metoprolol  succinate (TOPROL -XL) 24 hr tablet 25 mg, 25 mg, Oral, BID, Shafer, Devon, NP, 25 mg at 11/27/23 1004   Oral care mouth rinse, 15 mL, Mouth Rinse, PRN, Michaela Aisha SQUIBB, MD   pantoprazole  (PROTONIX ) EC tablet 40 mg, 40 mg, Oral, BID, Shafer, Devon, NP, 40 mg at 11/27/23 9352   Pitavastatin  Calcium  TABS 4 mg, 4 mg, Oral,  Daily, de La Torre, Harriman E, NP, 4 mg at 11/27/23 1004   senna-docusate (Senokot-S) tablet 1 tablet, 1 tablet, Oral, QHS PRN, Waddell Karna LABOR, NP     Patients Current Diet:  Diet Order                  Diet Heart Room service appropriate? Yes; Fluid consistency: Thin  Diet effective now                         Precautions / Restrictions Precautions Precautions: Fall Restrictions Weight Bearing Restrictions Per Provider Order: No    Has the patient had 2 or more falls or a fall with injury in the past year? 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 Independent   Indoor Mobility: Did the patient need assistance with walking from room to room (with or without device)? Independent   Stairs: Did the patient need assistance with internal or external stairs (with or without device)? Independent   Functional Cognition: Did the patient need help planning regular tasks such as shopping or remembering to take medications? Independent   Patient Information Are you of Hispanic, Latino/a,or Spanish origin?: A. No, not of Hispanic, Latino/a, or Spanish origin What is your race?: A. White Do you need or want an interpreter to communicate with a doctor or health care staff?: 0. No Patient information obtained via proxy : No  Patient's Response To:  Health Literacy and Transportation Is the patient able to respond to health literacy and transportation needs?: Yes Health Literacy - How often do you need to have someone help you when you read instructions, pamphlets, or other written material from your doctor or pharmacy?: Never In the past 12 months, has lack of transportation kept you from medical appointments or from getting medications?: No In the past 12 months, has lack of transportation kept you from meetings,  work, or from getting things needed for daily living?: No Health Literacy and Transportation obtained via proxy: No   Journalist, newspaper / Equipment Home Equipment: Agricultural consultant (2 wheels), Shower seat, Grab bars - toilet, Grab bars - tub/shower   Prior Device Use: Indicate devices/aids used by the patient prior to current illness, exacerbation or injury? Rolling Walker (would like to have a rollator)   Current Functional Level Cognition   Arousal/Alertness: Awake/alert Overall Cognitive Status: Within Functional Limits for tasks assessed Orientation Level: Oriented X4 Attention: Focused, Alternating, Sustained Focused Attention: Appears intact Sustained Attention: Appears intact Alternating Attention: Impaired Alternating Attention Impairment: Functional complex Memory: Impaired Memory Impairment: Decreased short term memory Decreased Short Term Memory: Verbal complex Awareness: Appears intact Problem Solving: Appears intact Executive Function: Organizing Organizing: Appears intact Safety/Judgment: Appears intact    Extremity Assessment (includes Sensation/Coordination)   Upper Extremity Assessment: LUE deficits/detail LUE Deficits / Details: functional strength, poor coordination; frequently dropping items; able touse to open toothpaste LUE Sensation: decreased light touch, decreased proprioception LUE Coordination: decreased fine motor, decreased gross motor  Lower Extremity Assessment: Defer to PT evaluation LLE Deficits / Details: decreased sensation and proprioception of LLE, decreased coordination with mvmt, mild L knee buckling in standing LLE Sensation: decreased proprioception, decreased light touch, history of peripheral neuropathy LLE Coordination: decreased gross motor     ADLs   Overall ADL's : Needs assistance/impaired Eating/Feeding: Sitting, Set up Grooming: Wash/dry hands, Oral care, Set up, Sitting Upper Body Bathing: Sitting, Minimal  assistance Lower Body Bathing: Sit to/from stand, Moderate assistance Upper Body Dressing : Sitting, Minimal assistance Lower Body Dressing: Sit to/from stand, Moderate assistance Toilet Transfer: Ambulation, Rolling walker (2 wheels), Minimal assistance Functional mobility during ADLs: Minimal assistance, Rolling walker (2 wheels)     Mobility   Overal bed mobility: Needs Assistance Bed Mobility: Supine to Sit Supine to sit: Supervision, HOB elevated, Used rails Sit to supine: Min assist General bed mobility comments: mod A for balance coming up to sitting. Pt able to return to supine and get LLE into bed but min A needed to position     Transfers   Overall transfer level: Needs assistance Equipment used: Rolling walker (2 wheels) Transfers: Sit to/from Stand Sit to Stand: Min assist Bed to/from chair/wheelchair/BSC transfer type:: Step pivot Step pivot transfers: Min assist General transfer comment: light rise and steady assist, cues for hand placement when rising and sitting. sit<>stand x5 throughout session     Ambulation / Gait / Stairs / Wheelchair Mobility   Ambulation/Gait Ambulation/Gait assistance: Editor, commissioning (Feet): 30 Feet (+15+15 to and from bathroom) Assistive device: Rolling walker (2 wheels) Gait Pattern/deviations: Step-to pattern, Decreased step length - left, Decreased dorsiflexion - left, Ataxic, Narrow base of support General Gait Details: assist for steadying pt and RW, RW management, max cues for heel strike and widening BOS with LLE Gait velocity: decr     Posture / Balance Dynamic Sitting Balance Sitting balance - Comments: L lateral bias in sitting, tactile cuing  to correct Balance Overall balance assessment: Needs assistance Sitting-balance support: Single extremity supported, Feet supported Sitting balance-Leahy Scale: Fair Sitting balance - Comments: L lateral bias in sitting, tactile cuing to correct Postural control: Left lateral  lean Standing balance support: Bilateral upper extremity supported, During functional activity Standing balance-Leahy Scale: Poor Standing balance comment: L lateral bias     Special considerations/life events  Diabetic management HgB A1C 5.6    Previous Home Environment (from acute therapy documentation) Living Arrangements: Spouse/significant other  Lives With: Spouse Available Help at Discharge: Family, Available 24 hours/day Type of Home: House Home Layout: One level Home Access: Stairs to enter, Ramped entrance [employer] Shower/Tub: Health visitor: Handicapped height Bathroom Accessibility: Yes   Discharge Living Setting Plans for Discharge Living Setting: Patient's home, House, Lives with (comment) (Lives with his wife.   Dtr lives locally.) Type of Home at Discharge: House Discharge Home Layout: One level Discharge Home Access: Ramped entrance Discharge Bathroom Shower/Tub: Walk-in shower, Door Discharge Bathroom Toilet: Handicapped height Discharge Bathroom Accessibility: Yes How Accessible: Accessible via wheelchair, Accessible via walker   Social/Family/Support Systems Patient Roles: Spouse, Parent Contact Information: Leory Mains - dtr [phone] Anticipated Caregiver: Wife Anticipated Caregiver's Contact Information: Harun Brumley - wife - [phone] Ability/Limitations of Caregiver: wife is 32 and can provide supervision.  Dtr lives close by. Caregiver Availability: 24/7 Discharge Plan Discussed with Primary Caregiver: Yes Is Caregiver In Agreement with Plan?: Yes Does Caregiver/Family have Issues with Lodging/Transportation while Pt is in Rehab?: No   Goals Patient/Family Goal for Rehab: PT/OT mod I and supervision goals Expected length of stay: 7 days Pt/Family Agrees to Admission and willing to participate: Yes Program Orientation Provided & Reviewed with Pt/Caregiver Including Roles  & Responsibilities: Yes   Decrease burden of  Care through IP rehab admission: N/A   Possible need for SNF placement upon discharge: Not anticipated   Patient Condition: I have reviewed medical records from [facility], spoken with CM, and patient and daughter. I met with patient at the bedside and discussed via phone for inpatient rehabilitation assessment.  Patient will benefit from ongoing PT and OT, can actively participate in 3 hours of therapy a day 5 days of the week, and can make measurable gains during the admission.  Patient will also benefit from the coordinated team approach during an Inpatient Acute Rehabilitation admission.  The patient will receive intensive therapy as well as Rehabilitation physician, nursing, social worker, and care management interventions.  Due to bladder management, bowel management, safety, skin/wound care, medication administration, pain management, and patient education the patient requires 24 hour a day rehabilitation nursing.  The patient is currently min to mod assist with mobility and basic ADLs.  Discharge setting and therapy post discharge at home with home health is anticipated.  Patient has agreed to participate in the Acute Inpatient Rehabilitation Program and will admit today.   Preadmission Screen Completed By:  Lovett CHRISTELLA Ropes, and [name], 11/27/2023 11:14 AM ______________________________________________________________________   Discussed status with Dr. Babs on 11/27/23  at 11:14 AM  and received approval for admission today.   Admission Coordinator:  Jamirah Zelaya E Danaija Eskridge, PT, time 11:14 AM Pattricia 11/27/23     Assessment/Plan: Diagnosis: right thalamic infarct with SAH Does the need for close, 24 hr/day Medical supervision in concert with the patient's rehab needs make it unreasonable for this patient to be served in a less intensive setting? Yes Co-Morbidities requiring supervision/potential complications: HTN, CAD, CKD, CHF, hx GIB Due  to bladder management, bowel management,  safety, skin/wound care, disease management, medication administration, pain management, and patient education, does the patient require 24 hr/day rehab nursing? Yes Does the patient require coordinated care of a physician, rehab nurse, PT, OT to address physical and functional deficits in the context of the above medical diagnosis(es)? Yes Addressing deficits in the following areas: balance, endurance, locomotion, strength, transferring, bowel/bladder control, bathing, dressing, feeding, grooming, toileting, cognition, and psychosocial support Can the patient actively participate in an intensive therapy program of at least 3 hrs of therapy 5 days a week? Yes The potential for patient to make measurable gains while on inpatient rehab is excellent Anticipated functional outcomes upon discharge from inpatient rehab: modified independent and supervision PT, modified independent and supervision OT, n/a SLP Estimated rehab length of stay to reach the above functional goals is: 7 days Anticipated discharge destination: Home 10. Overall Rehab/Functional Prognosis: excellent     MD Signature: Arthea IVAR Gunther, MD, [facility] Physical Medicine & Rehabilitation Medical Director Rehabilitation Services 11/27/2023           Revision History

## 2023-11-27 NOTE — Progress Notes (Signed)
 Pt had arrived on the unit

## 2023-11-27 NOTE — TOC Transition Note (Signed)
 Transition of Care [facility]) - Discharge Note   Patient Details  Name: [name] MRN: [id number] Date of Birth: [birth year]  [facility]) CM/SW Contact:  Andrez JULIANNA George, RN Phone Number: 11/27/2023, 11:54 AM   Clinical Narrative:     Pt is discharging to CIR today. CM signing off.   Final next level of care: IP Rehab Facility Barriers to Discharge: No Barriers Identified   Patient Goals and CMS Choice   CMS Medicare.gov Compare Post Acute Care list provided to:: Patient Choice offered to / list presented to : Patient      Discharge Placement                       Discharge Plan and Services Additional resources added to the After Visit Summary for                                       Social Drivers of Health (SDOH) Interventions SDOH Screenings   Food Insecurity: No Food Insecurity (11/25/2023)  Housing: Unknown (11/25/2023)  Transportation Needs: No Transportation Needs (11/25/2023)  Utilities: Not At Risk (11/25/2023)  Depression (PHQ2-9): Low Risk  (06/02/2019)  Physical Activity: Inactive (09/30/2018)   Received from [facility]  Social Connections: Unknown (11/25/2023)  Stress: No Stress Concern Present (09/30/2018)   Received from [facility]  Tobacco Use: Low Risk  (11/23/2023)  Health Literacy: Medium Risk (07/10/2020)   [facility] from [facility]     Readmission Risk Interventions     No data to display

## 2023-11-27 NOTE — H&P (Signed)
 Physical Medicine and Rehabilitation Admission H&P    Chief Complaint  Patient presents with   Functional deficits due to CVA   : HPI: Daniel Chrisley Montgomeryis a 83 year old male with PMHx of hereditary angioedema type II, A-fib after CABG, hypertension, hyperlipidemia, CAD status post CABG, MI, stroke, CHF, CKD, GERD, GI bleed, prostate cancer, hypothyroidism and diabetes presented to the [facility] ER on 8/22 with complaints of acute onset of left-sided weakness. The patient reported that he was sitting in a chair eating a sandwich when he suddenly found himself unable to get up to go to the bathroom. He experienced numbness and an inability to use his left leg, which led to a fall. His family promptly transported him to the emergency department. On initial evaluation, his NIH Stroke  was 9, indicating left-sided facial droop, left arm weakness, left leg weakness, ataxia in both arms, decreased sensation on the left, and dysarthria. A CT of the head showed no acute process, and a CTA head & neck Severe left and moderate to severe right P2 PCA stenosis. Severe right M2 MCA stenosis. Moderate left A2 ACA stenosis. Chronically occluded left vertebral artery. An MRI revealed a 1.5 cm acute lacunar infarct in the right thalamus without hemorrhage or mass effect. Additionally, there was evidence of trace subarachnoid hemorrhage [facility]) at the left superior convexity. Patient was considered an appropriate candidate for TNK, which was administered. Repeat CT Head- Subtle but confirmed trace left vertex subarachnoid hemorrhage (sagittal image 43). No intracranial mass effect or other complicating features. He was placed on Asprin alone due to prior hx of bleeding on Plavix .  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 He currently requires moderate assistance with mobility and basic activities of daily living (ADLs). Therapy evaluations  completed due to patient decreased functional mobility was admitted for a comprehensive rehab program.     Review of Systems  Constitutional: Negative.   HENT: Negative.    Eyes: Negative.   Respiratory: Negative.    Cardiovascular:  Negative for chest pain, palpitations and leg swelling.  Gastrointestinal: Negative.        LBM 8/25   Genitourinary: Negative.   Musculoskeletal:  Positive for joint pain and neck pain.       Neck tension and pain   Skin: Negative.        Multiple bruises to bilat UE.   Neurological:  Positive for weakness. Negative for tingling, tremors, seizures and headaches.  Endo/Heme/Allergies:  Positive for environmental allergies.  Psychiatric/Behavioral:  The patient has insomnia.    Past Medical History:  Diagnosis Date   Abnormal EKG 02/21/2015   Acute lower GI bleeding 05/28/2019   Anginal pain (HCC) 2016   Angio-edema    Anxiety    Arthritis    CAD S/P percutaneous coronary angioplasty    NSTEMI treated with LAD PCI with DES x 03 Feb 2015 Myoview low risk Nov 2017   Cataract    Cerebellar stroke (HCC) 06/14/2021   CHF (congestive heart failure) (HCC) 2020   after surgery only   Chronic diarrhea of unknown origin    CKD (chronic kidney disease) stage 3, GFR 30-59 ml/min 12/21/2017   Colitis 05/23/2015   Coronary artery disease 05/28/2019   Diverticular hemorrhage    Diverticulosis    Dyslipidemia, goal LDL below 70 02/21/2015   hyperlipidemia    Dysrhythmia    A-fib   Enterotoxigenic Escherichia  coli infection 05/24/2015   Essential hypertension 02/21/2015   Geographic tongue    GERD (gastroesophageal reflux disease)    GERD without esophagitis 06/15/2021   GI bleed 12/21/2017   Glossitis, benign migratory 12/04/2018   Gout    Hematochezia 12/21/2017   Admitted Sept 2019 with lower GI bleeding felt to be secondary to hemorrhoids   Hereditary angioedema type 2 (HCC) 07/28/2017   High cholesterol    History of kidney stones    History  of non-ST elevation myocardial infarction (NSTEMI) 02/22/2015   Nov 2016   History of prolonged Q-T interval on ECG    History of prostate cancer    Hoarseness 12/04/2018   Hx of Clostridium difficile infection    Hx of umbilical hernia repair    Hypertension    Hypothyroidism    Hypothyroidism, unspecified 06/15/2021   ILD (interstitial lung disease) (HCC) 12/17/2013   ?related to chicken farm exposure or aspiration pneumonitis from GERD Previously attributed to acute interstitial pneumonitis but based on HRCT this is likely IPF, probable UIP   Lower GI bleed 06/03/2019   Malignant neoplasm of prostate (HCC) 08/07/2014   Myocardial infarction [facility]) 2016   NSTEMI (non-ST elevated myocardial infarction) (HCC) 05/21/2018   Open metatarsal fracture 09/02/2020   PAF (paroxysmal atrial fibrillation) (HCC) 06/13/2018   Post CABG- discharged on Amiodarone , he will probably not need this long term   Palpitations 03/15/2015   palpitations    Peripheral vascular disease (HCC)    vericose veins   Personal history of digestive disease    gastric ulcer   Pre-diabetes    Pre-operative clearance 01/02/2018   Prostate cancer (HCC)    Prostate enlargement    S/P CABG x 3 05/28/2018    LIMA-LAD, RIMA-RCA, and Lt radial to OM.   Skin cancer    basal cell carcinoma   STEC (Shiga toxin-producing Escherichia coli) infection 05/24/2015   Stroke (cerebrum) (HCC) 06/15/2021   Tachycardia determined by examination of pulse    Thyroid  disease    Type 2 diabetes mellitus with unspecified complications (HCC)    Type 2 diabetes mellitus without complications (HCC) 06/15/2021   Urticaria    Past Surgical History:  Procedure Laterality Date   APPENDECTOMY     BACK SURGERY     CARDIAC CATHETERIZATION N/A 02/21/2015   Procedure: Left Heart Cath;  Surgeon: Dorn JINNY Lesches, MD;  Location: [facility] INVASIVE CV LAB;  Service: Cardiovascular;  Laterality: N/A;   CATARACT EXTRACTION, BILATERAL     CORONARY  ARTERY BYPASS GRAFT N/A 05/24/2018   Procedure: CORONARY ARTERY BYPASS GRAFTING (CABG), ON PUMP, TIMES THREE, USING BILATERAL INTERNAL MAMMARY ARTERY AND HARVESTED LEFT RADIAL ARTERY;  Surgeon: Lucas Dorise POUR, MD;  Location: [facility] OR;  Service: Open Heart Surgery;  Laterality: N/A;   CYSTOSCOPY WITH LITHOLAPAXY N/A 07/26/2020   Procedure: CYSTOSCOPY WITH LITHOLAPAXY WITH FULGERATION;  Surgeon: Renda Glance, MD;  Location: [facility] ORS;  Service: Urology;  Laterality: N/A;   HOLMIUM LASER APPLICATION N/A 07/26/2020   Procedure: HOLMIUM LASER APPLICATION;  Surgeon: Renda Glance, MD;  Location: [facility] ORS;  Service: Urology;  Laterality: N/A;   I & D EXTREMITY Right 09/02/2020   Procedure: IRRIGATION AND DEBRIDEMENT RIGHT 2ND AND 3RD METATARSAL;  Surgeon: Kit Rush, MD;  Location: [facility] OR;  Service: Orthopedics;  Laterality: Right;   LEFT HEART CATH AND CORONARY ANGIOGRAPHY N/A 05/22/2018   Procedure: LEFT HEART CATH AND CORONARY ANGIOGRAPHY;  Surgeon: Dann Candyce RAMAN, MD;  Location: [facility] INVASIVE CV LAB;  Service: Cardiovascular;  Laterality: N/A;   NASAL SINUS SURGERY     ORIF TOE FRACTURE Right 09/02/2020   Procedure: OPEN REDUCTION INTERNAL FIXATION (ORIF) OF RIGHT OPEN 2ND AND 3RD METATARSAL (TOE) OPEN FRACTURE;  Surgeon: Kit Rush, MD;  Location: [facility] OR;  Service: Orthopedics;  Laterality: Right;   PROSTATE BIOPSY     RADIAL ARTERY HARVEST Left 05/24/2018   Procedure: RADIAL ARTERY HARVEST;  Surgeon: Lucas Dorise POUR, MD;  Location: [facility] OR;  Service: Open Heart Surgery;  Laterality: Left;   right shoulder rotator cuff repair     TEE WITHOUT CARDIOVERSION N/A 05/24/2018   Procedure: TRANSESOPHAGEAL ECHOCARDIOGRAM (TEE);  Surgeon: Lucas Dorise POUR, MD;  Location: [facility] OR;  Service: Open Heart Surgery;  Laterality: N/A;   Family History  Problem Relation Age of Onset   Cancer Mother    Stomach cancer Mother    Cancer Father        prostate   Cancer Sister    Sleep apnea Neg Hx    Social History:  reports  that he has never smoked. He has never used smokeless tobacco. He reports that he does not drink alcohol  and does not use drugs. Allergies:  Allergies  Allergen Reactions   Zestril  [Lisinopril ] Anaphylaxis and Swelling    Face, lips, and throat swell   Buspirone  Other (See Comments)    Didn't like how it made him feel   Roxicodone  [Oxycodone ] Other (See Comments)    Hallucinations   Tape Other (See Comments)    PLASTIC TAPE TEARS OFF THE SKIN- Please use an alternative!!   Benadryl [Diphenhydramine Hcl] Swelling and Other (See Comments)    Facial swelling from high dose (tolerates Advil  PM as needed)   Darvocet [Propoxyphene N-Acetaminophen ] Other (See Comments)    Hallucinations    Darvon [Propoxyphene Hcl] Other (See Comments)    Hallucination    Norvasc  [Amlodipine ] Palpitations, Rash and Other (See Comments)    Heart pounding Headache  Rash on ankles   Tylenol  [Acetaminophen ] Swelling and Other (See Comments)    Foot swelling   Medications Prior to Admission  Medication Sig Dispense Refill   albuterol  (VENTOLIN  HFA) 108 (90 Base) MCG/ACT inhaler Inhale 2 puffs into the lungs. Every 4-6 hours as needed     amLODipine  (NORVASC ) 2.5 MG tablet TAKE 1 TABLET BY MOUTH EVERY DAY 90 tablet 2   aspirin  81 MG EC tablet Take 1 tablet (81 mg total) by mouth daily. 30 tablet 11   Azelastine  HCl 137 MCG/SPRAY SOLN Place 1 spray into both nostrils 2 (two) times daily.     betamethasone  valerate ointment (VALISONE ) 0.1 % Apply 1 application topically 2 (two) times daily as needed for rash.     Cyanocobalamin  (VITAMIN B-12 PO) Take 1 tablet by mouth daily.     fluoruracil (CARAC ) 0.5 % cream Apply to skin cancer for 1-7 days during 'flare up' as directed. 30 g 3   fluticasone  (FLONASE ) 50 MCG/ACT nasal spray Place 1 spray into both nostrils 2 (two) times daily.     Guaifenesin  (MUCINEX  MAXIMUM STRENGTH) 1200 MG TB12 Take 1,200 mg by mouth at bedtime.     icatibant  Acetate (FIRAZYR ) 30 MG/3ML  injection Inject 30 mg into the skin once as needed (HAE).     levothyroxine  (SYNTHROID ) 100 MCG tablet Take 100 mcg by mouth daily before breakfast.     losartan  (COZAAR ) 25 MG tablet Take 1 tablet (25 mg total) by mouth daily. (Patient taking differently: Take 25 mg  by mouth in the morning and at bedtime.) 90 tablet 1   metoprolol  succinate (TOPROL -XL) 25 MG 24 hr tablet TAKE 1 TABLET BY MOUTH TWICE A DAY 180 tablet 0   Multiple Vitamins-Minerals (MULTIVITAMIN MEN 50+) TABS Take 1 tablet by mouth daily.     nabumetone  (RELAFEN ) 750 MG tablet Take 750 mg by mouth daily.     nitroGLYCERIN  (NITROSTAT ) 0.4 MG SL tablet Place 0.4 mg under the tongue every 5 (five) minutes as needed for chest pain.     OVER THE COUNTER MEDICATION Take 1 capsule by mouth in the morning and at bedtime. Health Plus/Super Colon Cleanse     pantoprazole  (PROTONIX ) 40 MG tablet Take 40 mg by mouth 2 (two) times daily.     Pitavastatin  Calcium  (LIVALO ) 4 MG TABS Take 4 mg by mouth at bedtime.     Probiotic Product (PROBIOTIC DAILY PO) Take 1 capsule by mouth daily.     promethazine-dextromethorphan  (PROMETHAZINE-DM) 6.25-15 MG/5ML syrup TAKE 5-10 MLS ( 1-2 TEASPOONFULS) EVERY FOUR-SIX HOURS AS NEEDED FOR COUGH     TAKHZYRO  300 MG/2ML SOSY INJECT 300MG  SUBCUTANEOUSLY  EVERY 2 WEEKS (Patient taking differently: Inject 300 mg into the skin See admin instructions. Inject 300mg  into the skin every other Friday.) 4 mL 11   temazepam  (RESTORIL ) 15 MG capsule Take 15 mg by mouth at bedtime.        Home: Home Living Family/patient expects to be discharged to:: Private residence Living Arrangements: Spouse/significant other Available Help at Discharge: Family, Available 24 hours/day Type of Home: House Home Access: Stairs to enter, Ramped entrance Home Layout: One level Bathroom Shower/Tub: Health visitor: Handicapped height Bathroom Accessibility: Yes Home Equipment: Agricultural consultant (2 wheels), Shower seat,  Grab bars - toilet, Grab bars - tub/shower  Lives With: Spouse   Functional History: Prior Function Prior Level of Function : Independent/Modified Independent Mobility Comments: was not using AD PTA ADLs Comments: independent  Functional Status:  Mobility: Bed Mobility Overal bed mobility: Needs Assistance Bed Mobility: Supine to Sit Supine to sit: Supervision, HOB elevated, Used rails Sit to supine: Min assist General bed mobility comments: mod A for balance coming up to sitting. Pt able to return to supine and get LLE into bed but min A needed to position Transfers Overall transfer level: Needs assistance Equipment used: Rolling walker (2 wheels) Transfers: Sit to/from Stand Sit to Stand: Min assist Bed to/from chair/wheelchair/BSC transfer type:: Step pivot Step pivot transfers: Min assist General transfer comment: light rise and steady assist, cues for hand placement when rising and sitting. sit<>stand x5 throughout session Ambulation/Gait Ambulation/Gait assistance: Min assist Gait Distance (Feet): 30 Feet (+15+15 to and from bathroom) Assistive device: Rolling walker (2 wheels) Gait Pattern/deviations: Step-to pattern, Decreased step length - left, Decreased dorsiflexion - left, Ataxic, Narrow base of support General Gait Details: assist for steadying pt and RW, RW management, max cues for heel strike and widening BOS with LLE Gait velocity: decr    ADL: ADL Overall ADL's : Needs assistance/impaired Eating/Feeding: Sitting, Set up Grooming: Wash/dry hands, Oral care, Set up, Sitting Upper Body Bathing: Sitting, Minimal assistance Lower Body Bathing: Sit to/from stand, Moderate assistance Upper Body Dressing : Sitting, Minimal assistance Lower Body Dressing: Sit to/from stand, Moderate assistance Toilet Transfer: Ambulation, Rolling walker (2 wheels), Minimal assistance Functional mobility during ADLs: Minimal assistance, Rolling walker (2  wheels)  Cognition: Cognition Overall Cognitive Status: Within Functional Limits for tasks assessed Arousal/Alertness: Awake/alert Orientation Level: Oriented X4 Year: 2025 Month:  August Day of Week: Correct Attention: Focused, Alternating, Sustained Focused Attention: Appears intact Sustained Attention: Appears intact Alternating Attention: Impaired Alternating Attention Impairment: Functional complex Memory: Impaired Memory Impairment: Decreased short term memory Decreased Short Term Memory: Verbal complex Awareness: Appears intact Problem Solving: Appears intact Executive Function: Organizing Organizing: Appears intact Safety/Judgment: Appears intact Cognition Arousal: Alert Behavior During Therapy: WFL for tasks assessed/performed Overall Cognitive Status: Within Functional Limits for tasks assessed  Physical Exam: Blood pressure 115/77, pulse 82, temperature 97.7 F (36.5 C), temperature source Oral, resp. rate 16, height 6' 1 (1.854 m), weight 87.7 kg, SpO2 98%. Physical Exam HENT:     Head: Normocephalic.     Right Ear: Ear canal and external ear normal.     Left Ear: Ear canal and external ear normal.     Nose: Nose normal.     Mouth/Throat:     Mouth: Mucous membranes are moist.     Pharynx: Oropharynx is clear.  Eyes:     Extraocular Movements: Extraocular movements intact.     Conjunctiva/sclera: Conjunctivae normal.     Pupils: Pupils are equal, round, and reactive to light.  Cardiovascular:     Rate and Rhythm: Normal rate and regular rhythm.     Heart sounds: No murmur heard.    No gallop.  Pulmonary:     Effort: Pulmonary effort is normal. No respiratory distress.     Breath sounds: Normal breath sounds. No wheezing.  Abdominal:     General: Bowel sounds are normal. There is no distension.     Tenderness: There is no abdominal tenderness.  Musculoskeletal:        General: No swelling or tenderness. Normal range of motion.     Cervical back:  Normal range of motion.  Skin:    General: Skin is warm.     Findings: Bruising present.  Neurological:     Mental Status: He is alert.     Comments: Pt is alert and oriented to person, place, date, reason. Reasonable insight and awareness. STM seems intact. CN exam non-focal. MMT: RUE 5/5 prox to distal. LUE 4+/5 prox to distal. RLE 4/5 prox to 5/5 distally. LLE 4 prox to 4+/5 distally. Sl decrease in LT in both feet as well as left arm. + PD LUE. Sl limb ataxia LUE. Intact HTS LLE.  DTR's 1 to 2+. No abnl resting tone.   Psychiatric:        Mood and Affect: Mood normal.        Behavior: Behavior normal.     Results for orders placed or performed during the hospital encounter of 11/23/23 (from the past 48 hours)  Glucose, capillary     Status: Abnormal   Collection Time: 11/25/23 11:52 AM  Result Value Ref Range   Glucose-Capillary 119 (H) 70 - 99 mg/dL    Comment: Glucose reference range applies only to samples taken after fasting for at least 8 hours.  Glucose, capillary     Status: Abnormal   Collection Time: 11/25/23  4:01 PM  Result Value Ref Range   Glucose-Capillary 131 (H) 70 - 99 mg/dL    Comment: Glucose reference range applies only to samples taken after fasting for at least 8 hours.  Glucose, capillary     Status: Abnormal   Collection Time: 11/25/23  9:13 PM  Result Value Ref Range   Glucose-Capillary 108 (H) 70 - 99 mg/dL    Comment: Glucose reference range applies only to samples taken after fasting for  at least 8 hours.   Comment 1 Notify RN    Comment 2 Document in Chart   Glucose, capillary     Status: Abnormal   Collection Time: 11/26/23  6:11 AM  Result Value Ref Range   Glucose-Capillary 127 (H) 70 - 99 mg/dL    Comment: Glucose reference range applies only to samples taken after fasting for at least 8 hours.   Comment 1 Notify RN    Comment 2 Document in Chart   Glucose, capillary     Status: Abnormal   Collection Time: 11/26/23 11:31 AM  Result Value  Ref Range   Glucose-Capillary 256 (H) 70 - 99 mg/dL    Comment: Glucose reference range applies only to samples taken after fasting for at least 8 hours.  Glucose, capillary     Status: Abnormal   Collection Time: 11/26/23  4:34 PM  Result Value Ref Range   Glucose-Capillary 140 (H) 70 - 99 mg/dL    Comment: Glucose reference range applies only to samples taken after fasting for at least 8 hours.  Glucose, capillary     Status: Abnormal   Collection Time: 11/26/23  9:03 PM  Result Value Ref Range   Glucose-Capillary 162 (H) 70 - 99 mg/dL    Comment: Glucose reference range applies only to samples taken after fasting for at least 8 hours.   Comment 1 Notify RN    Comment 2 Document in Chart   CBC     Status: Abnormal   Collection Time: 11/27/23  4:35 AM  Result Value Ref Range   WBC 7.6 4.0 - 10.5 K/uL   RBC 4.05 (L) 4.22 - 5.81 MIL/uL   Hemoglobin 12.9 (L) 13.0 - 17.0 g/dL   HCT 62.7 (L) 60.9 - 47.9 %   MCV 91.9 80.0 - 100.0 fL   MCH 31.9 26.0 - 34.0 pg   MCHC 34.7 30.0 - 36.0 g/dL   RDW 87.5 88.4 - 84.4 %   Platelets 174 150 - 400 K/uL   nRBC 0.0 0.0 - 0.2 %    Comment: Performed at [facility], [address], [place], [place] [address]  Basic metabolic panel with GFR     Status: Abnormal   Collection Time: 11/27/23  4:35 AM  Result Value Ref Range   Sodium 136 135 - 145 mmol/L   Potassium 3.8 3.5 - 5.1 mmol/L   Chloride 105 98 - 111 mmol/L   CO2 22 22 - 32 mmol/L   Glucose, Bld 111 (H) 70 - 99 mg/dL    Comment: Glucose reference range applies only to samples taken after fasting for at least 8 hours.   BUN 33 (H) 8 - 23 mg/dL   Creatinine, Ser 8.16 (H) 0.61 - 1.24 mg/dL   Calcium  9.1 8.9 - 10.3 mg/dL   GFR, Estimated 36 (L) >60 mL/min    Comment: (NOTE) Calculated using the CKD-EPI Creatinine Equation (2021)    Anion gap 9 5 - 15    Comment: Performed at [facility], [address] [place], [place] [address]  Glucose, capillary     Status: Abnormal    Collection Time: 11/27/23  6:15 AM  Result Value Ref Range   Glucose-Capillary 126 (H) 70 - 99 mg/dL    Comment: Glucose reference range applies only to samples taken after fasting for at least 8 hours.   Comment 1 Notify RN    Comment 2 Document in Chart    No results found.  Blood pressure 115/77, pulse 82, temperature 97.7 F (36.5 C), temperature source Oral, resp. rate 16, height 6' 1 (1.854 m), weight 87.7 kg, SpO2 98%.  Medical Problem List and Plan: 1. Functional deficits secondary to right thalamic infarct, small SAH  -he has made nice neurological gains, but still with sensory/propioceptive deficits and ataxia.   -patient may shower  -ELOS/Goals: 5-7 days, mod I to supervision 2.  Antithrombotics: -DVT/anticoagulation:  Mechanical: Sequential compression devices, below knee Bilateral lower extremities  -antiplatelet therapy: Aspirin  81 mg (hx of bleeding on plavix )  3. Pain Management: Arthritis bilat hands and ankles: request home med nabumetone  750 mg?? 4. Mood/Behavior/Sleep: LCSW to follow for evaluation and support when available.   -antipsychotic agents:  Does not want Buspar  makes me feel horrible   -Insomnia: Requesting home Temazepam  15 mg for insomnia-ordered  5. Neuropsych/cognition: This patient is capable of making decisions on his own behalf. 6. Skin/Wound Care: routine pressure relief measures  7. Fluids/Electrolytes/Nutrition: monitor I&O and follow up chemistries   -Heart healthy diet   8. Hereditary angioedema: Patient supplied med (icatibant ) is in pharmacy fridge-patient had 1 episode of angioedema on 8/25 which was successfully treated with icatibant . 9. CAD s/p stenting HLD: LDL 106 on Pitavastatin  4 mg daily and Zetia    -Start Leqvio as outpatient therapy 10. HTN: norvasc  and losartan   11. T2DM: A1c 5.6  12. Post op     Daphne LITTIE Finders, NP 11/27/2023

## 2023-11-28 ENCOUNTER — Ambulatory Visit

## 2023-11-28 DIAGNOSIS — I639 Cerebral infarction, unspecified: Secondary | ICD-10-CM

## 2023-11-28 LAB — CBC WITH DIFFERENTIAL/PLATELET
Abs Immature Granulocytes: 0.01 K/uL (ref 0.00–0.07)
Basophils Absolute: 0 K/uL (ref 0.0–0.1)
Basophils Relative: 1 %
Eosinophils Absolute: 0.1 K/uL (ref 0.0–0.5)
Eosinophils Relative: 2 %
HCT: 35.7 % — ABNORMAL LOW (ref 39.0–52.0)
Hemoglobin: 12.6 g/dL — ABNORMAL LOW (ref 13.0–17.0)
Immature Granulocytes: 0 %
Lymphocytes Relative: 17 %
Lymphs Abs: 0.9 K/uL (ref 0.7–4.0)
MCH: 32.2 pg (ref 26.0–34.0)
MCHC: 35.3 g/dL (ref 30.0–36.0)
MCV: 91.3 fL (ref 80.0–100.0)
Monocytes Absolute: 0.6 K/uL (ref 0.1–1.0)
Monocytes Relative: 12 %
Neutro Abs: 3.6 K/uL (ref 1.7–7.7)
Neutrophils Relative %: 68 %
Platelets: 149 K/uL — ABNORMAL LOW (ref 150–400)
RBC: 3.91 MIL/uL — ABNORMAL LOW (ref 4.22–5.81)
RDW: 12.3 % (ref 11.5–15.5)
WBC: 5.3 K/uL (ref 4.0–10.5)
nRBC: 0 % (ref 0.0–0.2)

## 2023-11-28 LAB — COMPREHENSIVE METABOLIC PANEL WITH GFR
ALT: 17 U/L (ref 0–44)
AST: 19 U/L (ref 15–41)
Albumin: 3.2 g/dL — ABNORMAL LOW (ref 3.5–5.0)
Alkaline Phosphatase: 53 U/L (ref 38–126)
Anion gap: 10 (ref 5–15)
BUN: 29 mg/dL — ABNORMAL HIGH (ref 8–23)
CO2: 20 mmol/L — ABNORMAL LOW (ref 22–32)
Calcium: 9 mg/dL (ref 8.9–10.3)
Chloride: 107 mmol/L (ref 98–111)
Creatinine, Ser: 1.77 mg/dL — ABNORMAL HIGH (ref 0.61–1.24)
GFR, Estimated: 37 mL/min — ABNORMAL LOW (ref >60)
Glucose, Bld: 110 mg/dL — ABNORMAL HIGH (ref 70–99)
Potassium: 3.8 mmol/L (ref 3.5–5.1)
Sodium: 137 mmol/L (ref 135–145)
Total Bilirubin: 0.8 mg/dL (ref 0.0–1.2)
Total Protein: 5.6 g/dL — ABNORMAL LOW (ref 6.5–8.1)

## 2023-11-28 LAB — GLUCOSE, CAPILLARY
Glucose-Capillary: 122 mg/dL — ABNORMAL HIGH (ref 70–99)
Glucose-Capillary: 123 mg/dL — ABNORMAL HIGH (ref 70–99)
Glucose-Capillary: 162 mg/dL — ABNORMAL HIGH (ref 70–99)
Glucose-Capillary: 137 mg/dL — ABNORMAL HIGH (ref 70–99)

## 2023-11-28 NOTE — Evaluation (Signed)
 Speech Language Pathology Assessment and Plan  Patient Details  Name: Daniel Herrera MRN: [id number] Date of Birth: May 07, 1940  SLP Diagnosis:  n/a  Rehab Potential:  n/a ELOS:  n/a    Today's Date: 11/28/2023 SLP Individual Time: 8699-8654 SLP Individual Time Calculation (min): 45 min   Hospital Problem: Principal Problem:   CVA (cerebral vascular accident) [facility])  Past Medical History:  Past Medical History:  Diagnosis Date   Abnormal EKG 02/21/2015   Acute lower GI bleeding 05/28/2019   Anginal pain (HCC) 2016   Angio-edema    Anxiety    Arthritis    CAD S/P percutaneous coronary angioplasty    NSTEMI treated with LAD PCI with DES x 03 Feb 2015 Myoview low risk Nov 2017   Cataract    Cerebellar stroke (HCC) 06/14/2021   CHF (congestive heart failure) (HCC) 2020   after surgery only   Chronic diarrhea of unknown origin    CKD (chronic kidney disease) stage 3, GFR 30-59 ml/min 12/21/2017   Colitis 05/23/2015   Coronary artery disease 05/28/2019   Diverticular hemorrhage    Diverticulosis    Dyslipidemia, goal LDL below 70 02/21/2015   hyperlipidemia    Dysrhythmia    A-fib   Enterotoxigenic Escherichia coli infection 05/24/2015   Essential hypertension 02/21/2015   Geographic tongue    GERD (gastroesophageal reflux disease)    GERD without esophagitis 06/15/2021   GI bleed 12/21/2017   Glossitis, benign migratory 12/04/2018   Gout    Hematochezia 12/21/2017   Admitted Sept 2019 with lower GI bleeding felt to be secondary to hemorrhoids   Hereditary angioedema type 2 (HCC) 07/28/2017   High cholesterol    History of kidney stones    History of non-ST elevation myocardial infarction (NSTEMI) 02/22/2015   Nov 2016   History of prolonged Q-T interval on ECG    History of prostate cancer    Hoarseness 12/04/2018   Hx of Clostridium difficile infection    Hx of umbilical hernia repair    Hypertension    Hypothyroidism    Hypothyroidism, unspecified  06/15/2021   ILD (interstitial lung disease) (HCC) 12/17/2013   ?related to chicken farm exposure or aspiration pneumonitis from GERD Previously attributed to acute interstitial pneumonitis but based on HRCT this is likely IPF, probable UIP   Lower GI bleed 06/03/2019   Malignant neoplasm of prostate (HCC) 08/07/2014   Myocardial infarction [facility]) 2016   NSTEMI (non-ST elevated myocardial infarction) (HCC) 05/21/2018   Open metatarsal fracture 09/02/2020   PAF (paroxysmal atrial fibrillation) (HCC) 06/13/2018   Post CABG- discharged on Amiodarone , he will probably not need this long term   Palpitations 03/15/2015   palpitations    Peripheral vascular disease (HCC)    vericose veins   Personal history of digestive disease    gastric ulcer   Pre-diabetes    Pre-operative clearance 01/02/2018   Prostate cancer (HCC)    Prostate enlargement    S/P CABG x 3 05/28/2018    LIMA-LAD, RIMA-RCA, and Lt radial to OM.   Skin cancer    basal cell carcinoma   STEC (Shiga toxin-producing Escherichia coli) infection 05/24/2015   Stroke (cerebrum) (HCC) 06/15/2021   Tachycardia determined by examination of pulse    Thyroid  disease    Type 2 diabetes mellitus with unspecified complications (HCC)    Type 2 diabetes mellitus without complications (HCC) 06/15/2021   Urticaria    Past Surgical History:  Past Surgical History:  Procedure Laterality Date  APPENDECTOMY     BACK SURGERY     CARDIAC CATHETERIZATION N/A 02/21/2015   Procedure: Left Heart Cath;  Surgeon: Dorn JINNY Lesches, MD;  Location: [facility] INVASIVE CV LAB;  Service: Cardiovascular;  Laterality: N/A;   CATARACT EXTRACTION, BILATERAL     CORONARY ARTERY BYPASS GRAFT N/A 05/24/2018   Procedure: CORONARY ARTERY BYPASS GRAFTING (CABG), ON PUMP, TIMES THREE, USING BILATERAL INTERNAL MAMMARY ARTERY AND HARVESTED LEFT RADIAL ARTERY;  Surgeon: Lucas Dorise POUR, MD;  Location: [facility] OR;  Service: Open Heart Surgery;  Laterality: N/A;   CYSTOSCOPY  WITH LITHOLAPAXY N/A 07/26/2020   Procedure: CYSTOSCOPY WITH LITHOLAPAXY WITH FULGERATION;  Surgeon: Renda Glance, MD;  Location: [facility] ORS;  Service: Urology;  Laterality: N/A;   HOLMIUM LASER APPLICATION N/A 07/26/2020   Procedure: HOLMIUM LASER APPLICATION;  Surgeon: Renda Glance, MD;  Location: [facility] ORS;  Service: Urology;  Laterality: N/A;   I & D EXTREMITY Right 09/02/2020   Procedure: IRRIGATION AND DEBRIDEMENT RIGHT 2ND AND 3RD METATARSAL;  Surgeon: Kit Rush, MD;  Location: [facility] OR;  Service: Orthopedics;  Laterality: Right;   LEFT HEART CATH AND CORONARY ANGIOGRAPHY N/A 05/22/2018   Procedure: LEFT HEART CATH AND CORONARY ANGIOGRAPHY;  Surgeon: Dann Candyce RAMAN, MD;  Location: [facility] INVASIVE CV LAB;  Service: Cardiovascular;  Laterality: N/A;   NASAL SINUS SURGERY     ORIF TOE FRACTURE Right 09/02/2020   Procedure: OPEN REDUCTION INTERNAL FIXATION (ORIF) OF RIGHT OPEN 2ND AND 3RD METATARSAL (TOE) OPEN FRACTURE;  Surgeon: Kit Rush, MD;  Location: [facility] OR;  Service: Orthopedics;  Laterality: Right;   PROSTATE BIOPSY     RADIAL ARTERY HARVEST Left 05/24/2018   Procedure: RADIAL ARTERY HARVEST;  Surgeon: Lucas Dorise POUR, MD;  Location: [facility] OR;  Service: Open Heart Surgery;  Laterality: Left;   right shoulder rotator cuff repair     TEE WITHOUT CARDIOVERSION N/A 05/24/2018   Procedure: TRANSESOPHAGEAL ECHOCARDIOGRAM (TEE);  Surgeon: Lucas Dorise POUR, MD;  Location: [facility] OR;  Service: Open Heart Surgery;  Laterality: N/A;    Assessment / Plan / Recommendation Clinical Impression  An 83 y.o. male with hx of history of hereditary angioedema type II, PAF on ASA, HTN, HLD, CAD s/p CABG, MI, prior stroke, CHF, CKD, GERD, hx of GI bleed, history of prostate cancer, hypothyroidism, diabetes who presents to [facility] ED via private vehicle for acute onset of left-sided weakness. He experienced a fall due to left leg weakness.  Admitted 11/23/23, [facility] 9 for left facial droop, left arm and leg weakness, ataxia  in both left arm and leg, decreased sensation on the left, dysarthria. Patient was deemed an appropriate candidate for TNK and TNK was administered.  MRI positive for 1.5 cm acute lacunar infarct of the right thalamus. No hemorrhagic transformation. No mass effect.- evidence of trace SAH left superior convexity (possible Heidelberg classification 3c: Subarachnoid hemorrhage.  PT/OT evaluations completed with recommendations for acute inpatient rehab admission.  Cognitive-Linguistic Evaluation: Patient presents with cognitive-linguistic function that is [facility]. An oral mechanism exam was given to assess needed for speech and swallowing. Facial structures appeared WNL. SLP administered Cognistat to evaluate cognitive status. No deficits were present for orientation, attention, registration, repetition, naming, memory, calculations, and reasoning/executive functioning. Mild dysarthria that was noted in acute care seems to have since resolved. The patient scored within average range for each subtest thus Speech Therapy Services are not needed.   Skilled Therapeutic Interventions          SLP conducted skilled evaluation  session to assess cognitive-linguistic function. Utilized Cognistat and patient and/or family interview. Full results above.    SLP Assessment  Patient does not need any further Speech Lanaguage Pathology Services    Recommendations  Oral Care Recommendations: Oral care BID Recommendations for Other Services: Neuropsych consult Patient destination: Home Follow up Recommendations: None Equipment Recommended: None recommended by SLP    SLP Frequency  N/a   SLP Duration  SLP Intensity  SLP Treatment/Interventions  N/a   N/a   N/a    Pain Pain Assessment Pain Scale: 0-10 Pain Score: 0-No pain  Prior Functioning Cognitive/Linguistic Baseline: Within functional limits Type of Home: House  Lives With: Spouse Available Help at Discharge: Family;Available 24 hours/day Vocation:  Retired  Architectural technologist Overall Cognitive Status: Within Functional Limits for tasks assessed Arousal/Alertness: Awake/alert Orientation Level: Oriented X4 Year: 2025 Month: August Day of Week: Correct Attention: Focused;Alternating;Sustained Focused Attention: Appears intact Sustained Attention: Appears intact Alternating Attention: Impaired Alternating Attention Impairment: Functional complex Memory: Impaired Memory Impairment: Decreased short term memory Decreased Short Term Memory: Verbal complex Awareness: Appears intact Problem Solving: Appears intact Executive Function: Organizing Organizing: Appears intact Safety/Judgment: Appears intact  Comprehension Auditory Comprehension Overall Auditory Comprehension: Appears within functional limits for tasks assessed Expression Expression Primary Mode of Expression: Verbal Verbal Expression Overall Verbal Expression: Appears within functional limits for tasks assessed Oral Motor Oral Motor/Sensory Function Overall Oral Motor/Sensory Function: Within functional limits Motor Speech Overall Motor Speech: Appears within functional limits for tasks assessed Respiration: Within functional limits Phonation: Normal Resonance: Within functional limits Articulation: Within functional limitis Level of Impairment: Conversation Intelligibility: Intelligible Motor Planning: Within functional limits  Care Tool Care Tool Cognition Ability to hear (with hearing aid or hearing appliances if normally used Ability to hear (with hearing aid or hearing appliances if normally used): 1. Minimal difficulty - difficulty in some environments (e.g. when person speaks softly or setting is noisy)   Expression of Ideas and Wants Expression of Ideas and Wants: 4. Without difficulty (complex and basic) - expresses complex messages without difficulty and with speech that is clear and easy to understand   Understanding Verbal and Non-Verbal  Content Understanding Verbal and Non-Verbal Content: 4. Understands (complex and basic) - clear comprehension without cues or repetitions  Memory/Recall Ability Memory/Recall Ability : Current season;That he or she is in a hospital/hospital unit   Intelligibility: Intelligible  Kjirsten Bloodgood, M.A., CCC-SLP  [employer] [place] A Jarren Para 11/28/2023, 2:59 PM

## 2023-11-28 NOTE — Progress Notes (Signed)
 Inpatient Rehabilitation Admission Medication Review by a Pharmacist  A complete drug regimen review was completed for this patient to identify any potential clinically significant medication issues.  High Risk Drug Classes Is patient taking? Indication by Medication  Antipsychotic Yes, as an intravenous medication Compazine - n/v   Anticoagulant No   Antibiotic No   Opioid No   Antiplatelet Yes bASA- CVA   Hypoglycemics/insulin  No   Vasoactive Medication Yes Losartan , amlodipine , Toprol  XL-HTN   Chemotherapy No   Other Yes fleet enema , bisacodyl  , and - constipation Maalox- indigestion Pantoprazole - reflux  Robitussin- cough   Temazepam - insomnia Synthroid - hypothyroidism  Pitavastatin , zetia - HLD  Flonase - seasonal allergies Buspar - anxiety        Type of Medication Issue Identified Description of Issue Recommendation(s)  Drug Interaction(s) (clinically significant)     Duplicate Therapy     Allergy     No Medication Administration End Date     Incorrect Dose     Additional Drug Therapy Needed     Significant med changes from prior encounter (inform family/care partners about these prior to discharge). PTA meds not resumed: Pt takes Takhzyro  Every other Friday, Firazyr  PRN,  cyanocobalamin  daily, nabumetone  daily   Per inpatient discharge summary, recommended to start Leqvio as an outpatient   Communicate relevant medication changes to patient/family members at discharge from CIR.   Restart or discontinue PTA meds not resumed in CIR at discharge if clinically indicated.   Other       Clinically significant medication issues were identified that warrant physician communication and completion of prescribed/recommended actions by midnight of the next day:  No  Name of provider notified for urgent issues identified:   Provider Method of Notification:     Pharmacist comments:   Time spent performing this drug regimen review (minutes):  25  Massie Fila,  PharmD Clinical Pharmacist  11/28/2023 7:39 AM

## 2023-11-28 NOTE — Evaluation (Signed)
 Occupational Therapy Assessment and Plan  Patient Details  Name: Daniel Herrera MRN: [id number] Date of Birth: 12-02-1940  OT Diagnosis: ataxia, hemiplegia affecting non-dominant side, muscle weakness (generalized), and decreased activity tolerance Rehab Potential: Rehab Potential (ACUTE ONLY): Good ELOS: 10-12 days   Today's Date: 11/28/2023 OT Individual Time: 1050-1200 OT Individual Time Calculation (min): 70 min     Hospital Problem: Principal Problem:   CVA (cerebral vascular accident) [facility])   Past Medical History:  Past Medical History:  Diagnosis Date   Abnormal EKG 02/21/2015   Acute lower GI bleeding 05/28/2019   Anginal pain (HCC) 2016   Angio-edema    Anxiety    Arthritis    CAD S/P percutaneous coronary angioplasty    NSTEMI treated with LAD PCI with DES x 03 Feb 2015 Myoview low risk Nov 2017   Cataract    Cerebellar stroke (HCC) 06/14/2021   CHF (congestive heart failure) (HCC) 2020   after surgery only   Chronic diarrhea of unknown origin    CKD (chronic kidney disease) stage 3, GFR 30-59 ml/min 12/21/2017   Colitis 05/23/2015   Coronary artery disease 05/28/2019   Diverticular hemorrhage    Diverticulosis    Dyslipidemia, goal LDL below 70 02/21/2015   hyperlipidemia    Dysrhythmia    A-fib   Enterotoxigenic Escherichia coli infection 05/24/2015   Essential hypertension 02/21/2015   Geographic tongue    GERD (gastroesophageal reflux disease)    GERD without esophagitis 06/15/2021   GI bleed 12/21/2017   Glossitis, benign migratory 12/04/2018   Gout    Hematochezia 12/21/2017   Admitted Sept 2019 with lower GI bleeding felt to be secondary to hemorrhoids   Hereditary angioedema type 2 (HCC) 07/28/2017   High cholesterol    History of kidney stones    History of non-ST elevation myocardial infarction (NSTEMI) 02/22/2015   Nov 2016   History of prolonged Q-T interval on ECG    History of prostate cancer    Hoarseness 12/04/2018   Hx of  Clostridium difficile infection    Hx of umbilical hernia repair    Hypertension    Hypothyroidism    Hypothyroidism, unspecified 06/15/2021   ILD (interstitial lung disease) (HCC) 12/17/2013   ?related to chicken farm exposure or aspiration pneumonitis from GERD Previously attributed to acute interstitial pneumonitis but based on HRCT this is likely IPF, probable UIP   Lower GI bleed 06/03/2019   Malignant neoplasm of prostate (HCC) 08/07/2014   Myocardial infarction [facility]) 2016   NSTEMI (non-ST elevated myocardial infarction) (HCC) 05/21/2018   Open metatarsal fracture 09/02/2020   PAF (paroxysmal atrial fibrillation) (HCC) 06/13/2018   Post CABG- discharged on Amiodarone , he will probably not need this long term   Palpitations 03/15/2015   palpitations    Peripheral vascular disease (HCC)    vericose veins   Personal history of digestive disease    gastric ulcer   Pre-diabetes    Pre-operative clearance 01/02/2018   Prostate cancer (HCC)    Prostate enlargement    S/P CABG x 3 05/28/2018    LIMA-LAD, RIMA-RCA, and Lt radial to OM.   Skin cancer    basal cell carcinoma   STEC (Shiga toxin-producing Escherichia coli) infection 05/24/2015   Stroke (cerebrum) (HCC) 06/15/2021   Tachycardia determined by examination of pulse    Thyroid  disease    Type 2 diabetes mellitus with unspecified complications (HCC)    Type 2 diabetes mellitus without complications (HCC) 06/15/2021   Urticaria  Past Surgical History:  Past Surgical History:  Procedure Laterality Date   APPENDECTOMY     BACK SURGERY     CARDIAC CATHETERIZATION N/A 02/21/2015   Procedure: Left Heart Cath;  Surgeon: Dorn JINNY Lesches, MD;  Location: [facility] INVASIVE CV LAB;  Service: Cardiovascular;  Laterality: N/A;   CATARACT EXTRACTION, BILATERAL     CORONARY ARTERY BYPASS GRAFT N/A 05/24/2018   Procedure: CORONARY ARTERY BYPASS GRAFTING (CABG), ON PUMP, TIMES THREE, USING BILATERAL INTERNAL MAMMARY ARTERY AND HARVESTED  LEFT RADIAL ARTERY;  Surgeon: Lucas Dorise POUR, MD;  Location: [facility] OR;  Service: Open Heart Surgery;  Laterality: N/A;   CYSTOSCOPY WITH LITHOLAPAXY N/A 07/26/2020   Procedure: CYSTOSCOPY WITH LITHOLAPAXY WITH FULGERATION;  Surgeon: Renda Glance, MD;  Location: [facility] ORS;  Service: Urology;  Laterality: N/A;   HOLMIUM LASER APPLICATION N/A 07/26/2020   Procedure: HOLMIUM LASER APPLICATION;  Surgeon: Renda Glance, MD;  Location: [facility] ORS;  Service: Urology;  Laterality: N/A;   I & D EXTREMITY Right 09/02/2020   Procedure: IRRIGATION AND DEBRIDEMENT RIGHT 2ND AND 3RD METATARSAL;  Surgeon: Kit Rush, MD;  Location: [facility] OR;  Service: Orthopedics;  Laterality: Right;   LEFT HEART CATH AND CORONARY ANGIOGRAPHY N/A 05/22/2018   Procedure: LEFT HEART CATH AND CORONARY ANGIOGRAPHY;  Surgeon: Dann Candyce RAMAN, MD;  Location: [facility] INVASIVE CV LAB;  Service: Cardiovascular;  Laterality: N/A;   NASAL SINUS SURGERY     ORIF TOE FRACTURE Right 09/02/2020   Procedure: OPEN REDUCTION INTERNAL FIXATION (ORIF) OF RIGHT OPEN 2ND AND 3RD METATARSAL (TOE) OPEN FRACTURE;  Surgeon: Kit Rush, MD;  Location: [facility] OR;  Service: Orthopedics;  Laterality: Right;   PROSTATE BIOPSY     RADIAL ARTERY HARVEST Left 05/24/2018   Procedure: RADIAL ARTERY HARVEST;  Surgeon: Lucas Dorise POUR, MD;  Location: [facility] OR;  Service: Open Heart Surgery;  Laterality: Left;   right shoulder rotator cuff repair     TEE WITHOUT CARDIOVERSION N/A 05/24/2018   Procedure: TRANSESOPHAGEAL ECHOCARDIOGRAM (TEE);  Surgeon: Lucas Dorise POUR, MD;  Location: [facility] OR;  Service: Open Heart Surgery;  Laterality: N/A;    Assessment & Plan Clinical Impression: Patient is a 83 year old male with PMHx of hereditary angioedema type II, A-fib after CABG, hypertension, hyperlipidemia, CAD status post CABG, MI, stroke, CHF, CKD, GERD, GI bleed, prostate cancer, hypothyroidism and diabetes presented to the [facility] ER on 8/22 with complaints of acute onset of  left-sided weakness. The patient reported that he was sitting in a chair eating a sandwich when he suddenly found himself unable to get up to go to the bathroom. He experienced numbness and an inability to use his left leg, which led to a fall. His family promptly transported him to the emergency department. On initial evaluation, his NIH Stroke  was 9, indicating left-sided facial droop, left arm weakness, left leg weakness, ataxia in both arms, decreased sensation on the left, and dysarthria. A CT of the head showed no acute process, and a CTA head & neck Severe left and moderate to severe right P2 PCA stenosis. Severe right M2 MCA stenosis. Moderate left A2 ACA stenosis. Chronically occluded left vertebral artery. An MRI revealed a 1.5 cm acute lacunar infarct in the right thalamus without hemorrhage or mass effect. Additionally, there was evidence of trace subarachnoid hemorrhage [facility]) at the left superior convexity. Patient was considered an appropriate candidate for TNK, which was administered. Repeat CT Head- Subtle but confirmed trace left vertex subarachnoid hemorrhage (sagittal image  43). No intracranial mass effect or other complicating features. He was placed on Asprin alone due to prior hx of bleeding on Plavix .  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 He currently requires moderate assistance with mobility and basic activities of daily living (ADLs). Patient transferred to CIR on 11/27/2023 .    Patient currently requires min with basic self-care skills secondary to muscle weakness, decreased cardiorespiratoy endurance, unbalanced muscle activation, ataxia, and decreased coordination, decreased attention to left, decreased safety awareness, and decreased standing balance, hemiplegia, and decreased balance strategies.  Prior to hospitalization, patient could complete BADLs/IADLs independently.   Patient will benefit from skilled  intervention to increase independence with basic self-care skills prior to discharge home with care partner.  Anticipate patient will require intermittent supervision and follow up outpatient.  OT - End of Session Activity Tolerance: Tolerates 10 - 20 min activity with multiple rests Endurance Deficit: Yes Endurance Deficit Description: rest breaks with mobility tasks OT Assessment Rehab Potential (ACUTE ONLY): Good OT Barriers to Discharge: Decreased caregiver support OT Patient demonstrates impairments in the following area(s): Balance;Endurance;Motor;Safety;Perception OT Basic ADL's Functional Problem(s): Bathing;Dressing;Toileting OT Advanced ADL's Functional Problem(s): Simple Meal Preparation OT Transfers Functional Problem(s): Toilet;Tub/Shower OT Additional Impairment(s): Fuctional Use of Upper Extremity OT Plan OT Intensity: Minimum of 1-2 x/day, 45 to 90 minutes OT Frequency: 5 out of 7 days OT Duration/Estimated Length of Stay: 10-12 days OT Treatment/Interventions: Cognitive remediation/compensation;Balance/vestibular training;Community reintegration;Discharge planning;Disease mangement/prevention;DME/adaptive equipment instruction;Functional electrical stimulation;Functional mobility training;Neuromuscular re-education;Pain management;Patient/family education;Psychosocial support;Self Care/advanced ADL retraining;Skin care/wound managment;Splinting/orthotics;Therapeutic Activities;Therapeutic Exercise;UE/LE Strength taining/ROM;UE/LE Coordination activities;Visual/perceptual remediation/compensation;Wheelchair propulsion/positioning OT Basic Self-Care Anticipated Outcome(s): Mod I OT Toileting Anticipated Outcome(s): Mod I OT Bathroom Transfers Anticipated Outcome(s): Mod I OT Recommendation Recommendations for Other Services: None Patient destination: Home Follow Up Recommendations: Outpatient OT Equipment Recommended: To be determined   OT  Evaluation Precautions/Restrictions  Precautions Precautions: Fall Recall of Precautions/Restrictions: Impaired Restrictions Weight Bearing Restrictions Per Provider Order: No General Chart Reviewed: Yes Family/Caregiver Present: No Pain Pain Assessment Pain Scale: 0-10 Pain Score: 0-No pain Home Living/Prior Functioning Home Living Family/patient expects to be discharged to:: Private residence Living Arrangements: Spouse/significant other Available Help at Discharge: Family, Available 24 hours/day Type of Home: House Home Access: Stairs to enter, Ramped entrance Secretary/administrator of Steps: 2 Entrance Stairs-Rails: Right Home Layout: One level Bathroom Shower/Tub: Psychologist, counselling, Door Public relations account executive, grab bars, and detachable shower head) Bathroom Toilet: Handicapped height (Uses BSC) Bathroom Accessibility: Yes  Lives With: Spouse IADL History Homemaking Responsibilities: Yes Homemaking Comments: Spouse had CVA in April 2025, pt provides assistance for IADLs. 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 Vision Baseline Vision/History: 1 Wears glasses (Recent cataract surgery.) Ability to See in Adequate Light: 0 Adequate Patient Visual Report: No change from baseline Vision Assessment?: Wears glasses for reading;Wears glasses for driving Perception  Perception: Impaired Praxis Praxis: Impaired Praxis Impairment Details: Motor planning Praxis-Other Comments: Ataxia Cognition Cognition Overall Cognitive Status: Within Functional Limits for tasks assessed Arousal/Alertness: Awake/alert Orientation Level: Person;Place;Situation Safety/Judgment: Appears intact Brief  Interview for Mental Status (BIMS) Repetition of Three Words (First Attempt): 3 Temporal Orientation: Year: Correct Temporal Orientation: Month: Accurate within 5 days Temporal Orientation: Day: Correct Recall: Sock:  Yes, no cue required Recall: Blue: Yes, no cue required Recall: Bed: Yes, no cue required BIMS Summary Score: 15 Sensation Sensation Light Touch: Impaired by gross assessment Hot/Cold: Appears Intact Proprioception: Appears Intact Additional Comments: Reports numbness in median nerve pattern in fingers, reports sometimes having numbness LLE Coordination Gross Motor Movements are Fluid and Coordinated: No Fine Motor Movements are Fluid and Coordinated: No Coordination and Movement Description: Ataxia and L hemi Finger Nose Finger Test: WNL RUE, LLE ataxia, dysmetria Motor  Motor Motor: Ataxia;Hemiplegia Motor - Skilled Clinical Observations: L hemi with coordination deficits  Trunk/Postural Assessment  Cervical Assessment Cervical Assessment: Exceptions to [facility] (forward head) Thoracic Assessment Thoracic Assessment: Exceptions to [facility] (rounded shoulders) Lumbar Assessment Lumbar Assessment: Exceptions to [facility] (posterior pelvic tilt) Postural Control Postural Control: Deficits on evaluation Righting Reactions: delayed and inadequate Protective Responses: decreased Postural Limitations: decreased  Balance Balance Balance Assessed: Yes Standardized Balance Assessment Standardized Balance Assessment: Timed Up and Go Test Timed Up and Go Test TUG: Normal TUG Normal TUG (seconds): 38.44 Static Sitting Balance Static Sitting - Balance Support: Feet supported Static Sitting - Level of Assistance: 5: Stand by assistance Dynamic Sitting Balance Dynamic Sitting - Balance Support: Feet supported Dynamic Sitting - Level of Assistance: 5: Stand by assistance (SUP-CGA) Sitting balance - Comments: L lateral bias in sitting Static Standing Balance Static Standing -  Balance Support: During functional activity;Bilateral upper extremity supported Static Standing - Level of Assistance: 4: Min assist Dynamic Standing Balance Dynamic Standing - Balance Support: During functional activity;Bilateral upper extremity supported Dynamic Standing - Level of Assistance: 4: Min assist;3: Mod assist Extremity/Trunk Assessment RUE Assessment RUE Assessment: Exceptions to [facility] Active Range of Motion (AROM) Comments: WFL General Strength Comments: 4-/5 LUE Assessment LUE Assessment: Exceptions to [facility] Active Range of Motion (AROM) Comments: WFL General Strength Comments: 3+/5 proximally; dystmetria/ataxia LUE Body System: Neuro Brunstrum levels for arm and hand: Arm;Hand Brunstrum level for arm: Stage V Relative Independence from Synergy Brunstrum level for hand: Stage VI Isolated joint movements  Care Tool Care Tool Self Care Eating   Eating Assist Level: Set up assist    Oral Care    Oral Care Assist Level: Set up assist    Bathing   Body parts bathed by patient: Right arm;Chest;Left arm;Abdomen;Front perineal area;Buttocks;Right upper leg;Left upper leg;Right lower leg;Left lower leg;Face     Assist Level: Minimal Assistance - Patient > 75% (For balance)    Upper Body Dressing(including orthotics)   What is the patient wearing?: Pull over shirt   Assist Level: Set up assist    Lower Body Dressing (excluding footwear)   What is the patient wearing?: Pants;Underwear/pull up Assist for lower body dressing: Minimal Assistance - Patient > 75%    Putting on/Taking off footwear   What is the patient wearing?: Non-skid slipper socks Assist for footwear: Set up assist       Care Tool Toileting Toileting activity   Assist for toileting: Minimal Assistance - Patient > 75%     Care Tool Bed Mobility Roll left and right activity   Roll left and right assist level: Contact Guard/Touching assist    Sit to lying activity   Sit to lying assist level:  Contact Guard/Touching assist    Lying to sitting on side of bed activity   Lying to sitting on side of bed assist level: the ability to move from lying on the back to sitting on the side of the bed with no back support.: Contact Guard/Touching assist  Care Tool Transfers Sit to stand transfer   Sit to stand assist level: Minimal Assistance - Patient > 75%    Chair/bed transfer   Chair/bed transfer assist level: Minimal Assistance - Patient > 75%     Toilet transfer   Assist Level: Moderate Assistance - Patient 50 - 74%     Care Tool Cognition  Expression of Ideas and Wants Expression of Ideas and Wants: 4. Without difficulty (complex and basic) - expresses complex messages without difficulty and with speech that is clear and easy to understand  Understanding Verbal and Non-Verbal Content Understanding Verbal and Non-Verbal Content: 4. Understands (complex and basic) - clear comprehension without cues or repetitions   Memory/Recall Ability Memory/Recall Ability : Current season;That he or she is in a hospital/hospital unit   Refer to Care Plan for Long Term Goals  SHORT TERM GOAL WEEK 1 OT Short Term Goal 1 (Week 1): STGs=LTGs due to patient's estimated length of stay.  Recommendations for other services: None    Skilled Therapeutic Intervention  Session began with introduction to OT role, OT POC, and general orientation to rehab unit/schedule. Pt completes full-body shower with levels of assistance noted below. Ambulatory transfers with Min-Mod A with cuing for attention to LLE advancement. Pt remained sitting in WC with posey belt activated.   ADL ADL Eating: Set up Where Assessed-Eating: Wheelchair Grooming: Setup Where Assessed-Grooming: Sitting at sink Upper Body Bathing: Setup Where Assessed-Upper Body Bathing: Shower Lower Body Bathing: Contact guard;Minimal assistance Where Assessed-Lower Body Bathing: Shower Upper Body Dressing: Setup Where Assessed-Upper  Body Dressing: Wheelchair Lower Body Dressing: Contact guard;Minimal assistance Where Assessed-Lower Body Dressing: Wheelchair Toileting: Contact guard Where Assessed-Toileting: Toilet;Bedside Commode Toilet Transfer: Minimal assistance;Moderate assistance Toilet Transfer Method: Proofreader: Acupuncturist: Minimal assistance;Moderate assistance Film/video editor Method: Designer, industrial/product: Environmental education officer Sit to Stand: Minimal Assistance - Patient > 75% Stand to Sit: Minimal Assistance - Patient > 75%   Discharge Criteria: Patient will be discharged from OT if patient refuses treatment 3 consecutive times without medical reason, if treatment goals not met, if there is a change in medical status, if patient makes no progress towards goals or if patient is discharged from hospital.  The above assessment, treatment plan, treatment alternatives and goals were discussed and mutually agreed upon: by patient  Nereida Habermann, OTR/L, MSOT  11/28/2023, 12:14 PM

## 2023-11-28 NOTE — Progress Notes (Signed)
 Inpatient Rehabilitation Care Coordinator Assessment and Plan Patient Details  Name: Daniel Herrera MRN: [id number] Date of Birth: 22-Aug-1940  Today's Date: 11/28/2023  Hospital Problems: Principal Problem:   CVA (cerebral vascular accident) [facility])  Past Medical History:  Past Medical History:  Diagnosis Date   Abnormal EKG 02/21/2015   Acute lower GI bleeding 05/28/2019   Anginal pain (HCC) 2016   Angio-edema    Anxiety    Arthritis    CAD S/P percutaneous coronary angioplasty    NSTEMI treated with LAD PCI with DES x 03 Feb 2015 Myoview low risk Nov 2017   Cataract    Cerebellar stroke (HCC) 06/14/2021   CHF (congestive heart failure) (HCC) 2020   after surgery only   Chronic diarrhea of unknown origin    CKD (chronic kidney disease) stage 3, GFR 30-59 ml/min 12/21/2017   Colitis 05/23/2015   Coronary artery disease 05/28/2019   Diverticular hemorrhage    Diverticulosis    Dyslipidemia, goal LDL below 70 02/21/2015   hyperlipidemia    Dysrhythmia    A-fib   Enterotoxigenic Escherichia coli infection 05/24/2015   Essential hypertension 02/21/2015   Geographic tongue    GERD (gastroesophageal reflux disease)    GERD without esophagitis 06/15/2021   GI bleed 12/21/2017   Glossitis, benign migratory 12/04/2018   Gout    Hematochezia 12/21/2017   Admitted Sept 2019 with lower GI bleeding felt to be secondary to hemorrhoids   Hereditary angioedema type 2 (HCC) 07/28/2017   High cholesterol    History of kidney stones    History of non-ST elevation myocardial infarction (NSTEMI) 02/22/2015   Nov 2016   History of prolonged Q-T interval on ECG    History of prostate cancer    Hoarseness 12/04/2018   Hx of Clostridium difficile infection    Hx of umbilical hernia repair    Hypertension    Hypothyroidism    Hypothyroidism, unspecified 06/15/2021   ILD (interstitial lung disease) (HCC) 12/17/2013   ?related to chicken farm exposure or aspiration pneumonitis from  GERD Previously attributed to acute interstitial pneumonitis but based on HRCT this is likely IPF, probable UIP   Lower GI bleed 06/03/2019   Malignant neoplasm of prostate (HCC) 08/07/2014   Myocardial infarction [facility]) 2016   NSTEMI (non-ST elevated myocardial infarction) (HCC) 05/21/2018   Open metatarsal fracture 09/02/2020   PAF (paroxysmal atrial fibrillation) (HCC) 06/13/2018   Post CABG- discharged on Amiodarone , he will probably not need this long term   Palpitations 03/15/2015   palpitations    Peripheral vascular disease (HCC)    vericose veins   Personal history of digestive disease    gastric ulcer   Pre-diabetes    Pre-operative clearance 01/02/2018   Prostate cancer (HCC)    Prostate enlargement    S/P CABG x 3 05/28/2018    LIMA-LAD, RIMA-RCA, and Lt radial to OM.   Skin cancer    basal cell carcinoma   STEC (Shiga toxin-producing Escherichia coli) infection 05/24/2015   Stroke (cerebrum) (HCC) 06/15/2021   Tachycardia determined by examination of pulse    Thyroid  disease    Type 2 diabetes mellitus with unspecified complications (HCC)    Type 2 diabetes mellitus without complications (HCC) 06/15/2021   Urticaria    Past Surgical History:  Past Surgical History:  Procedure Laterality Date   APPENDECTOMY     BACK SURGERY     CARDIAC CATHETERIZATION N/A 02/21/2015   Procedure: Left Heart Cath;  Surgeon: Dorn JINNY Lesches,  MD;  Location: [facility] INVASIVE CV LAB;  Service: Cardiovascular;  Laterality: N/A;   CATARACT EXTRACTION, BILATERAL     CORONARY ARTERY BYPASS GRAFT N/A 05/24/2018   Procedure: CORONARY ARTERY BYPASS GRAFTING (CABG), ON PUMP, TIMES THREE, USING BILATERAL INTERNAL MAMMARY ARTERY AND HARVESTED LEFT RADIAL ARTERY;  Surgeon: Lucas Dorise POUR, MD;  Location: [facility] OR;  Service: Open Heart Surgery;  Laterality: N/A;   CYSTOSCOPY WITH LITHOLAPAXY N/A 07/26/2020   Procedure: CYSTOSCOPY WITH LITHOLAPAXY WITH FULGERATION;  Surgeon: Renda Glance, MD;  Location:  [facility] ORS;  Service: Urology;  Laterality: N/A;   HOLMIUM LASER APPLICATION N/A 07/26/2020   Procedure: HOLMIUM LASER APPLICATION;  Surgeon: Renda Glance, MD;  Location: [facility] ORS;  Service: Urology;  Laterality: N/A;   I & D EXTREMITY Right 09/02/2020   Procedure: IRRIGATION AND DEBRIDEMENT RIGHT 2ND AND 3RD METATARSAL;  Surgeon: Kit Rush, MD;  Location: [facility] OR;  Service: Orthopedics;  Laterality: Right;   LEFT HEART CATH AND CORONARY ANGIOGRAPHY N/A 05/22/2018   Procedure: LEFT HEART CATH AND CORONARY ANGIOGRAPHY;  Surgeon: Dann Candyce RAMAN, MD;  Location: [facility] INVASIVE CV LAB;  Service: Cardiovascular;  Laterality: N/A;   NASAL SINUS SURGERY     ORIF TOE FRACTURE Right 09/02/2020   Procedure: OPEN REDUCTION INTERNAL FIXATION (ORIF) OF RIGHT OPEN 2ND AND 3RD METATARSAL (TOE) OPEN FRACTURE;  Surgeon: Kit Rush, MD;  Location: [facility] OR;  Service: Orthopedics;  Laterality: Right;   PROSTATE BIOPSY     RADIAL ARTERY HARVEST Left 05/24/2018   Procedure: RADIAL ARTERY HARVEST;  Surgeon: Lucas Dorise POUR, MD;  Location: [facility] OR;  Service: Open Heart Surgery;  Laterality: Left;   right shoulder rotator cuff repair     TEE WITHOUT CARDIOVERSION N/A 05/24/2018   Procedure: TRANSESOPHAGEAL ECHOCARDIOGRAM (TEE);  Surgeon: Lucas Dorise POUR, MD;  Location[facility] OR;  Service: Open Heart Surgery;  Laterality: N/A;   Social History:  reports that he has never smoked. He has never used smokeless tobacco. He reports that he does not drink alcohol  and does not use drugs.  Family / Support Systems Marital Status: Married Patient Roles: Spouse, Parent, Other (Comment) (retiree-chicken farmer) Spouse/Significant Other: Daniel Herrera [phone] Children: Daniel Herrera-daughter [phone] lives around the corner from Other Supports: Friends and church members Anticipated Caregiver: Wife and daughter Ability/Limitations of Caregiver: Wife had a CVA in 07/2023 and has speech deficits, also has post polio syndrome. Can only provide  supervision Caregiver Availability: 24/7 Family Dynamics: Close with daughter and son in-law who live around the corner from them. They have two other children who do not visit. 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: NA Guardian/Conservator: None-according to MD pt is capable of making his own decisions while here   Abuse/Neglect Abuse/Neglect Assessment Can Be Completed: Yes Physical Abuse: Denies Verbal Abuse: Denies Sexual Abuse: Denies Exploitation of patient/patient's resources: Denies Self-Neglect: Denies  Patient response to: Social Isolation - How often do you feel lonely or isolated from those around you?: Never  Emotional Status Pt's affect, behavior and adjustment status: Pt has always been independent and taken care of others he helped his wife after her stroke and feels he is doing fairly  well with his. He hopes to be able to take care of himself and recover from this. He had one in 2023 and did well and hopeful he will again Recent Psychosocial Issues: other health issues Psychiatric History: NA he seems to be coping appropriately and verbalizes quite well his feelings and concerns Substance Abuse History: NA  Patient / Family Perceptions, Expectations & Goals Pt/Family understanding of illness & functional limitations: Pt is ablew to explain his stroke and deficits, he is recovering which is encouraging to him. He does talk with the MD's involved and feels he understands his plan moving forward. He has also been through this before in 2023 Premorbid pt/family  roles/activities: husband, father, retiree, farmer, church member, etc Anticipated changes in roles/activities/participation: resume Pt/family expectations/goals: Pt states:  I hope to do well and not need help when I go home.  Community [employer] Agencies: None Premorbid Home Care/DME Agencies: Other (Comment) (rw, grab bars, tub seat) Transportation available at discharge: daughter Is the patient able to respond to transportation needs?: Yes In the past 12 months, has lack of transportation kept you from medical appointments or from getting medications?: No In the past 12 months, has lack of transportation kept you from meetings, work, or from getting things needed for daily living?: No  Discharge Planning Living Arrangements: Spouse/significant other Support Systems: Spouse/significant other, Children, Other relatives, Friends/neighbors, Church/faith community Type of Residence: Private residence Insurance Resources: [employer] Financial Resources: Social Security Financial Screen Referred: No Living Expenses: Own Money Management: Patient Does the patient have any problems obtaining your medications?: No Home Management: both he and wife Patient/Family Preliminary Plans: Return home with wife who is able to be there but not assist much due to her own health issues. He is aware he is being evaluated today and goals being set for stay here. Care Coordinator Barriers to Discharge: Decreased caregiver support Care Coordinator Anticipated Follow Up Needs: HH/OP  Clinical Impression Pleasant gentleman who is motivated to dow ell and recover form this CVA he had one in 2023 and did well. He has good supports via wife and daughter who lives around the corner form him. Will await team's evaluations and work on discharge needs. Aware Auria-LCSW is his primary Child psychotherapist and she will return on Monday 9/1  Daniel Herrera MATSU 11/28/2023, 1:07 PM

## 2023-11-28 NOTE — Progress Notes (Signed)
 PROGRESS NOTE   Subjective/Complaints:  No events overnight. Vitals stable , labs stable this a.m.    11/28/2023    4:43 AM 11/27/2023    8:21 PM 11/27/2023    3:29 PM  Vitals with BMI  Height   6' 1  Weight   184 lbs 8 oz  BMI   24.35  Systolic 125 132 856  Diastolic 61 77 66  Pulse 64 66 67    Recent Labs    11/27/23 1643 11/27/23 2134 11/28/23 0629  GLUCAP 132* 121* 122*     P.o. intakes appropriate *** Continent of bladder *** Last BM ***   ROS: Denies fevers, chills, N/V, abdominal pain, constipation, diarrhea, SOB, cough, chest pain, new weakness or paraesthesias.    Objective:   No results found. Recent Labs    11/27/23 0435 11/28/23 0427  WBC 7.6 5.3  HGB 12.9* 12.6*  HCT 37.2* 35.7*  PLT 174 149*   Recent Labs    11/27/23 0435 11/28/23 0427  NA 136 137  K 3.8 3.8  CL 105 107  CO2 22 20*  GLUCOSE 111* 110*  BUN 33* 29*  CREATININE 1.83* 1.77*  CALCIUM  9.1 9.0    Intake/Output Summary (Last 24 hours) at 11/28/2023 0836 Last data filed at 11/28/2023 0440 Gross per 24 hour  Intake 220 ml  Output 300 ml  Net -80 ml        Physical Exam: Vital Signs Blood pressure 125/61, pulse 64, temperature 98 F (36.7 C), resp. rate 16, height 6' 1 (1.854 m), weight 83.7 kg, SpO2 98%. Constitutional: No apparent distress. Appropriate appearance for age.  HENT: No JVD. Neck Supple. Trachea midline. Atraumatic, normocephalic. Eyes: PERRLA. EOMI. Visual fields grossly intact.  Cardiovascular: RRR, no murmurs/rub/gallops. No Edema. Peripheral pulses 2+  Respiratory: CTAB. No rales, rhonchi, or wheezing. On RA.  Abdomen: + bowel sounds, normoactive. No distention or tenderness.  GU: Not examined. *** +Foley, draining clear urine.  Skin: C/D/I. No apparent lesions. MSK:      No apparent deformity.      Strength:                RUE: 5/5 SA, 5/5 EF, 5/5 EE, 5/5 WE, 5/5 FF, 5/5 FA ***                 LUE: 5/5 SA, 5/5 EF, 5/5 EE, 5/5 WE, 5/5 FF, 5/5 FA ***                RLE: 5/5 HF, 5/5 KE, 5/5 DF, 5/5 EHL, 5/5 PF ***                LLE:  5/5 HF, 5/5 KE, 5/5 DF, 5/5 EHL, 5/5 PF ***  Neurologic exam:  Cognition: AAO to person, place, time and event. *** Language: Fluent, No substitutions or neoglisms. No dysarthria. Names 3/3 objects correctly. *** Memory: Recalls 3/3 objects at 5 minutes. No apparent deficits *** Insight: Good *** insight into current condition.  Mood: Pleasant affect, appropriate mood.  Sensation: Equal and intact in BL UE and Les. *** Reflexes: 2+ in BL UE and LEs. Negative Hoffman's and babinski signs bilaterally. ***  CN: 2-12 grossly intact. *** Coordination: No apparent tremors. No ataxia on FTN, HTS bilaterally. *** Spasticity: MAS 0 in all extremities. *** Gait: Not observed ***        Assessment/Plan: 1. Functional deficits which require 3+ hours per day of interdisciplinary therapy in a comprehensive inpatient rehab setting. Physiatrist is providing close team supervision and 24 hour management of active medical problems listed below. Physiatrist and rehab team continue to assess barriers to discharge/monitor patient progress toward functional and medical goals  Care Tool:  Bathing              Bathing assist       Upper Body Dressing/Undressing Upper body dressing        Upper body assist      Lower Body Dressing/Undressing Lower body dressing            Lower body assist       Toileting Toileting    Toileting assist       Transfers Chair/bed transfer  Transfers assist           Locomotion Ambulation   Ambulation assist              Walk 10 feet activity   Assist           Walk 50 feet activity   Assist           Walk 150 feet activity   Assist           Walk 10 feet on uneven surface  activity   Assist           Wheelchair     Assist                Wheelchair 50 feet with 2 turns activity    Assist            Wheelchair 150 feet activity     Assist          Blood pressure 125/61, pulse 64, temperature 98 F (36.7 C), resp. rate 16, height 6' 1 (1.854 m), weight 83.7 kg, SpO2 98%.   Medical Problem List and Plan: 1. Functional deficits secondary to right thalamic infarct, small SAH post CABG             -he has made nice neurological gains, but still with sensory/propioceptive deficits and ataxia.              -patient may shower             -ELOS/Goals: 5-7 days, mod I to supervision 2.  Antithrombotics: -DVT/anticoagulation:  Mechanical: Sequential compression devices, below knee Bilateral lower extremities             -antiplatelet therapy: Aspirin  81 mg (hx of bleeding on plavix )  3. Pain Management: Arthritis bilat hands and ankles: request home med nabumetone  750 mg---will resume 4. Mood/Behavior/Sleep: LCSW to follow for evaluation and support when available.              -antipsychotic agents:  Does not want Buspar  makes me feel horrible              -Insomnia: Requesting home Temazepam  15 mg for insomnia-ordered              -pt reported that sleep study was performed last night. I see no record of study nor do I see mention of it by medical team. Will ask NP to look in to.  5. Neuropsych/cognition: This patient  is capable of making decisions on his own behalf. 6. Skin/Wound Care: routine pressure relief measures  7. Fluids/Electrolytes/Nutrition: monitor I&O and follow up chemistries              -Heart healthy diet   8. Hereditary angioedema: Patient supplied med (icatibant ) is in pharmacy fridge-patient had 1 episode of angioedema on 8/25 which was successfully treated with icatibant  PRN.              -uses takhzyro  inj sq every 2 weeks at home 9. CAD s/p stenting HLD: LDL 106 on Pitavastatin  4 mg daily and Zetia               -Start Leqvio as outpatient therapy             -see meds below for  PAF/HTN/CHF             -nitro prn CP 10. HTN/CHF: norvasc  and losartan               -bp controlled at present             -bp goal of less than 180/105 11. T2DM: A1c 5.6  12. Post op PAF after CABG             -HR now controlled, regular             -metoprolol  xl 25mg  daily,  13. CKD IIIb. Cr has been stable around  1.8  14. Hx of prostate cancer/hematuria 15. Hx hypothyroid--levothyroxine   16. Hx of interstitial lung disease/allergies             -albuterol  prn             -flonase  and azelastine  nasal sprays bid  LOS: 1 days A FACE TO FACE EVALUATION WAS PERFORMED  Daniel Herrera 11/28/2023, 8:36 AM

## 2023-11-28 NOTE — Evaluation (Signed)
 Physical Therapy Assessment and Plan  Patient Details  Name: Daniel Herrera MRN: [id number] Date of Birth: 08/04/1940  PT Diagnosis: Abnormality of gait, Coordination disorder, Difficulty walking, Hemiparesis non-dominant, Impaired cognition, Impaired sensation, and Muscle weakness Rehab Potential: Good ELOS: 10-12 days   Today's Date: 11/28/2023 PT Individual Time: 0900-1010 PT Individual Time Calculation (min): 70 min    Hospital Problem: Principal Problem:   CVA (cerebral vascular accident) [facility])   Past Medical History:  Past Medical History:  Diagnosis Date   Abnormal EKG 02/21/2015   Acute lower GI bleeding 05/28/2019   Anginal pain (HCC) 2016   Angio-edema    Anxiety    Arthritis    CAD S/P percutaneous coronary angioplasty    NSTEMI treated with LAD PCI with DES x 03 Feb 2015 Myoview low risk Nov 2017   Cataract    Cerebellar stroke (HCC) 06/14/2021   CHF (congestive heart failure) (HCC) 2020   after surgery only   Chronic diarrhea of unknown origin    CKD (chronic kidney disease) stage 3, GFR 30-59 ml/min 12/21/2017   Colitis 05/23/2015   Coronary artery disease 05/28/2019   Diverticular hemorrhage    Diverticulosis    Dyslipidemia, goal LDL below 70 02/21/2015   hyperlipidemia    Dysrhythmia    A-fib   Enterotoxigenic Escherichia coli infection 05/24/2015   Essential hypertension 02/21/2015   Geographic tongue    GERD (gastroesophageal reflux disease)    GERD without esophagitis 06/15/2021   GI bleed 12/21/2017   Glossitis, benign migratory 12/04/2018   Gout    Hematochezia 12/21/2017   Admitted Sept 2019 with lower GI bleeding felt to be secondary to hemorrhoids   Hereditary angioedema type 2 (HCC) 07/28/2017   High cholesterol    History of kidney stones    History of non-ST elevation myocardial infarction (NSTEMI) 02/22/2015   Nov 2016   History of prolonged Q-T interval on ECG    History of prostate cancer    Hoarseness 12/04/2018   Hx of  Clostridium difficile infection    Hx of umbilical hernia repair    Hypertension    Hypothyroidism    Hypothyroidism, unspecified 06/15/2021   ILD (interstitial lung disease) (HCC) 12/17/2013   ?related to chicken farm exposure or aspiration pneumonitis from GERD Previously attributed to acute interstitial pneumonitis but based on HRCT this is likely IPF, probable UIP   Lower GI bleed 06/03/2019   Malignant neoplasm of prostate (HCC) 08/07/2014   Myocardial infarction [facility]) 2016   NSTEMI (non-ST elevated myocardial infarction) (HCC) 05/21/2018   Open metatarsal fracture 09/02/2020   PAF (paroxysmal atrial fibrillation) (HCC) 06/13/2018   Post CABG- discharged on Amiodarone , he will probably not need this long term   Palpitations 03/15/2015   palpitations    Peripheral vascular disease (HCC)    vericose veins   Personal history of digestive disease    gastric ulcer   Pre-diabetes    Pre-operative clearance 01/02/2018   Prostate cancer (HCC)    Prostate enlargement    S/P CABG x 3 05/28/2018    LIMA-LAD, RIMA-RCA, and Lt radial to OM.   Skin cancer    basal cell carcinoma   STEC (Shiga toxin-producing Escherichia coli) infection 05/24/2015   Stroke (cerebrum) (HCC) 06/15/2021   Tachycardia determined by examination of pulse    Thyroid  disease    Type 2 diabetes mellitus with unspecified complications (HCC)    Type 2 diabetes mellitus without complications (HCC) 06/15/2021   Urticaria  Past Surgical History:  Past Surgical History:  Procedure Laterality Date   APPENDECTOMY     BACK SURGERY     CARDIAC CATHETERIZATION N/A 02/21/2015   Procedure: Left Heart Cath;  Surgeon: Dorn JINNY Lesches, MD;  Location: [facility] INVASIVE CV LAB;  Service: Cardiovascular;  Laterality: N/A;   CATARACT EXTRACTION, BILATERAL     CORONARY ARTERY BYPASS GRAFT N/A 05/24/2018   Procedure: CORONARY ARTERY BYPASS GRAFTING (CABG), ON PUMP, TIMES THREE, USING BILATERAL INTERNAL MAMMARY ARTERY AND HARVESTED  LEFT RADIAL ARTERY;  Surgeon: Lucas Dorise POUR, MD;  Location: [facility] OR;  Service: Open Heart Surgery;  Laterality: N/A;   CYSTOSCOPY WITH LITHOLAPAXY N/A 07/26/2020   Procedure: CYSTOSCOPY WITH LITHOLAPAXY WITH FULGERATION;  Surgeon: Renda Glance, MD;  Location: [facility] ORS;  Service: Urology;  Laterality: N/A;   HOLMIUM LASER APPLICATION N/A 07/26/2020   Procedure: HOLMIUM LASER APPLICATION;  Surgeon: Renda Glance, MD;  Location: [facility] ORS;  Service: Urology;  Laterality: N/A;   I & D EXTREMITY Right 09/02/2020   Procedure: IRRIGATION AND DEBRIDEMENT RIGHT 2ND AND 3RD METATARSAL;  Surgeon: Kit Rush, MD;  Location: [facility] OR;  Service: Orthopedics;  Laterality: Right;   LEFT HEART CATH AND CORONARY ANGIOGRAPHY N/A 05/22/2018   Procedure: LEFT HEART CATH AND CORONARY ANGIOGRAPHY;  Surgeon: Dann Candyce RAMAN, MD;  Location: [facility] INVASIVE CV LAB;  Service: Cardiovascular;  Laterality: N/A;   NASAL SINUS SURGERY     ORIF TOE FRACTURE Right 09/02/2020   Procedure: OPEN REDUCTION INTERNAL FIXATION (ORIF) OF RIGHT OPEN 2ND AND 3RD METATARSAL (TOE) OPEN FRACTURE;  Surgeon: Kit Rush, MD;  Location: [facility] OR;  Service: Orthopedics;  Laterality: Right;   PROSTATE BIOPSY     RADIAL ARTERY HARVEST Left 05/24/2018   Procedure: RADIAL ARTERY HARVEST;  Surgeon: Lucas Dorise POUR, MD;  Location: [facility] OR;  Service: Open Heart Surgery;  Laterality: Left;   right shoulder rotator cuff repair     TEE WITHOUT CARDIOVERSION N/A 05/24/2018   Procedure: TRANSESOPHAGEAL ECHOCARDIOGRAM (TEE);  Surgeon: Lucas Dorise POUR, MD;  Location: [facility] OR;  Service: Open Heart Surgery;  Laterality: N/A;    Assessment & Plan Clinical Impression: Patient is a 83 year old male with PMHx of hereditary angioedema type II, A-fib after CABG, hypertension, hyperlipidemia, CAD status post CABG, MI, stroke, CHF, CKD, GERD, GI bleed, prostate cancer, hypothyroidism and diabetes presented to the [facility] ER on 8/22 with complaints of acute onset of  left-sided weakness. The patient reported that he was sitting in a chair eating a sandwich when he suddenly found himself unable to get up to go to the bathroom. He experienced numbness and an inability to use his left leg, which led to a fall. His family promptly transported him to the emergency department. On initial evaluation, his NIH Stroke  was 9, indicating left-sided facial droop, left arm weakness, left leg weakness, ataxia in both arms, decreased sensation on the left, and dysarthria. A CT of the head showed no acute process, and a CTA head & neck Severe left and moderate to severe right P2 PCA stenosis. Severe right M2 MCA stenosis. Moderate left A2 ACA stenosis. Chronically occluded left vertebral artery. An MRI revealed a 1.5 cm acute lacunar infarct in the right thalamus without hemorrhage or mass effect. Additionally, there was evidence of trace subarachnoid hemorrhage [facility]) at the left superior convexity. Patient was considered an appropriate candidate for TNK, which was administered. Repeat CT Head- Subtle but confirmed trace left vertex subarachnoid hemorrhage (sagittal image  43). No intracranial mass effect or other complicating features. He was placed on Asprin alone due to prior hx of bleeding on Plavix .  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 He currently requires moderate assistance with mobility and basic activities of daily living (ADLs). Therapy evaluations completed due to patient decreased functional mobility was admitted for a comprehensive rehab program.   Patient currently requires mod with mobility secondary to muscle weakness, decreased cardiorespiratoy endurance, impaired timing and sequencing and ataxia, decreased attention to left, decreased awareness, decreased problem solving, and decreased safety awareness, and decreased standing balance, decreased postural control, hemiplegia, and decreased balance  strategies.  Prior to hospitalization, patient was independent  with mobility and lived with Spouse in a House home.  Home access is 2Stairs to enter, Ramped entrance.  Patient will benefit from skilled PT intervention to maximize safe functional mobility, minimize fall risk, and decrease caregiver burden for planned discharge home with 24 hour supervision.  Anticipate patient will benefit from follow up OP at discharge.  PT - End of Session Activity Tolerance: Tolerates 30+ min activity with multiple rests Endurance Deficit: Yes Endurance Deficit Description: rest breaks with mobility tasks PT Assessment Rehab Potential (ACUTE/IP ONLY): Good PT Barriers to Discharge: Decreased caregiver support PT Barriers to Discharge Comments: pt wife had recent stroke in April, unable to physically assist PT Patient demonstrates impairments in the following area(s): Balance;Behavior;Endurance;Motor;Perception;Safety;Sensory PT Transfers Functional Problem(s): Bed Mobility;Bed to Chair;Car;Furniture PT Locomotion Functional Problem(s): Stairs;Ambulation;Wheelchair Mobility PT Plan PT Intensity: Minimum of 1-2 x/day ,45 to 90 minutes PT Frequency: 5 out of 7 days PT Duration Estimated Length of Stay: 10-12 days PT Treatment/Interventions: Ambulation/gait training;Discharge planning;Functional mobility training;Psychosocial support;Therapeutic Activities;Visual/perceptual remediation/compensation;Balance/vestibular training;Disease management/prevention;Neuromuscular re-education;Therapeutic Exercise;Wheelchair propulsion/positioning;Skin care/wound management;Cognitive remediation/compensation;DME/adaptive equipment instruction;Pain management;Splinting/orthotics;UE/LE Strength taining/ROM;Community reintegration;Patient/family education;Stair training;UE/LE Coordination activities PT Transfers Anticipated Outcome(s): supervision PT Locomotion Anticipated Outcome(s): supervision ambulatory PT  Recommendation Recommendations for Other Services: None Follow Up Recommendations: Outpatient PT Patient destination: Home Equipment Recommended: None recommended by PT   PT Evaluation Precautions/Restrictions Precautions Precautions: Fall Recall of Precautions/Restrictions: Impaired Restrictions Weight Bearing Restrictions Per Provider Order: No Pain Interference Pain Interference Pain Effect on Sleep: 1. Rarely or not at all Pain Interference with Therapy Activities: 1. Rarely or not at all Pain Interference with Day-to-Day Activities: 1. 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 Vision/Perception  Vision - History Ability to See in Adequate Light: 0 Adequate  Cognition Overall Cognitive Status: Within Functional Limits for tasks assessed Arousal/Alertness: Awake/alert Orientation Level: Oriented X4 Sensation Sensation Light Touch: Impaired by gross assessment Hot/Cold: Appears Intact Proprioception: Appears Intact Additional Comments: reports numbness in median nerve pattern in fingers, reports sometimes having numbness LLE Coordination Gross Motor Movements are Fluid and Coordinated: No Fine Motor Movements are Fluid and Coordinated: No Coordination and Movement Description: ataxia and L hemi Finger Nose Finger Test: WNL  RUE, LLE ataxia, dysmetria Motor  Motor Motor: Ataxia;Hemiplegia Motor - Skilled  Clinical Observations: L hemi with coordination deficits  Trunk/Postural Assessment  Cervical Assessment Cervical Assessment: Exceptions to [facility] (forward head) Thoracic Assessment Thoracic Assessment: Exceptions to [facility] (rounded shoulders) Lumbar Assessment Lumbar Assessment: Exceptions to [facility] (posterior pelvic tilt) Postural Control Postural Control: Deficits on evaluation Righting Reactions: delayed and inadequate Protective Responses: decreased Postural Limitations: decreased  Balance Balance Balance Assessed: Yes Standardized Balance Assessment Standardized Balance Assessment: Timed Up and Go Test Timed Up and Go Test TUG: Normal TUG Normal TUG (seconds): 38.44 Static Sitting Balance Static Sitting - Balance Support: Feet supported Static Sitting - Level of Assistance: 5: Stand by assistance Dynamic Sitting Balance Dynamic Sitting - Balance Support: Feet supported Dynamic Sitting - Level of Assistance: 5: Stand by assistance Static Standing Balance Static Standing - Balance Support: During functional activity;Bilateral upper extremity supported Static Standing - Level of Assistance: 4: Min assist Dynamic Standing Balance Dynamic Standing - Balance Support: During functional activity;Bilateral upper extremity supported Dynamic Standing - Level of Assistance: 4: Min assist Extremity Assessment  RLE Assessment RLE Assessment: Within Functional Limits LLE Assessment LLE Assessment: Exceptions to [facility] General Strength Comments: grossly 4+/5  Care Tool Care Tool Bed Mobility Roll left and right activity   Roll left and right assist level: Contact Guard/Touching assist    Sit to lying activity   Sit to lying assist level: Contact Guard/Touching assist    Lying to sitting on side of bed activity   Lying to sitting on side of bed assist level: the ability to move from lying on the back to  sitting on the side of the bed with no back support.: Contact Guard/Touching assist     Care Tool Transfers Sit to stand transfer   Sit to stand assist level: Minimal Assistance - Patient > 75%    Chair/bed transfer   Chair/bed transfer assist level: Minimal Assistance - Patient > 75%    Car transfer   Car transfer assist level: Minimal Assistance - Patient > 75% (simulated with bed/chair transfer)      Care Tool Locomotion Ambulation   Assist level: Moderate Assistance - Patient 50 - 74% Assistive device: Walker-rolling Max distance: 60'  Walk 10 feet activity   Assist level: Minimal Assistance - Patient > 75% Assistive device: Walker-rolling   Walk 50 feet with 2 turns activity   Assist level: Moderate Assistance - Patient - 50 - 74% Assistive device: Walker-rolling  Walk 150 feet activity Walk 150 feet activity did not occur: Safety/medical concerns      Walk 10 feet on uneven surfaces activity Walk 10 feet on uneven surfaces activity did not occur: Safety/medical concerns      Stairs   Assist level: Moderate Assistance - Patient - 50 - 74% Stairs assistive device: 2 hand rails Max number of stairs: 4  Walk up/down 1 step activity   Walk up/down 1 step (curb) assist level: Moderate Assistance - Patient - 50 - 74% Walk up/down 1 step or curb assistive device: 2 hand rails  Walk up/down 4 steps activity   Walk up/down 4 steps assist level: Moderate Assistance - Patient - 50 - 74%    Walk up/down 12 steps activity Walk up/down 12 steps activity did not occur: Safety/medical concerns      Pick up small objects from floor Pick up small object from the floor (from standing position) activity did not occur: Safety/medical concerns      Wheelchair Is the patient using a wheelchair?: Yes Type of Wheelchair: Manual   Wheelchair assist  level: Supervision/Verbal cueing Max wheelchair distance: 200'  Wheel 50 feet with 2 turns activity   Assist Level: Supervision/Verbal  cueing  Wheel 150 feet activity   Assist Level: Supervision/Verbal cueing    Refer to Care Plan for Long Term Goals  SHORT TERM GOAL WEEK 1 PT Short Term Goal 1 (Week 1): Pt will complete transfers with CGA consistently with LRAD PT Short Term Goal 2 (Week 1): Pt will complete gait iwth LRAD with CGA 150' PT Short Term Goal 3 (Week 1): Pt will complete stair negotiation with CGA and min verbal cues for LLE awareness  Recommendations for other services: None   Skilled Therapeutic Intervention Evaluation completed (see details above and below) with education on PT POC and goals and individual treatment initiated with focus on gait training and therapeutic activities to promote safety with transfers and self care tasks. Pt denies pain. Pt with notable LUE/LLE ataxia with all functional mobility as well as some impulsivity. Pt completes transfers with min assist. Pt completes dressing with mod assist for postural stability without UE support. Pt completes gait with RW 2x60' with min/mod assist and cues for sequencing LLE and increasing proximity to RW as pt demonstrating significant forward trunk flexion and LLE toe catch, inconsistent step length. Pt completes TUG in 38.44 seconds indicating increased risk for falls. Pt completes up/down 4 steps with BHRs, max verbal cues for LLE positioning and postural awareness as pt demonstrating some impulsivity, mod assist for task. Pt returns to room and remains seated in [facility] with all needs within reach, cal light in place and chair alarm donned and activated at end of session.    Mobility Transfers Transfers: Sit to Stand;Stand to Sit;Stand Pivot Transfers Sit to Stand: Minimal Assistance - Patient > 75% Stand to Sit: Minimal Assistance - Patient > 75% Stand Pivot Transfers: Minimal Assistance - Patient > 75% Stand Pivot Transfer Details: Verbal cues for technique;Verbal cues for precautions/safety;Verbal cues for safe use of DME/AE Transfer (Assistive  device): Rolling walker Locomotion  Gait Ambulation: Yes Gait Assistance: Moderate Assistance - Patient 50-74%;Minimal Assistance - Patient > 75% Gait Distance (Feet): 60 Feet Assistive device: Rolling walker Gait Assistance Details: Verbal cues for sequencing;Verbal cues for safe use of DME/AE;Verbal cues for gait pattern;Verbal cues for precautions/safety;Verbal cues for technique;Visual cues/gestures for sequencing Gait Gait: Yes Gait Pattern: Impaired Gait Pattern: Ataxic Gait velocity: decreased Stairs / Additional Locomotion Stairs: Yes Stairs Assistance: Moderate Assistance - Patient 50 - 74% Stair Management Technique: Two rails Number of Stairs: 4 Height of Stairs: 6 Wheelchair Mobility Wheelchair Mobility: Yes Wheelchair Assistance: Doctor, general practice: Both lower extermities   Discharge Criteria: Patient will be discharged from PT if patient refuses treatment 3 consecutive times without medical reason, if treatment goals not met, if there is a change in medical status, if patient makes no progress towards goals or if patient is discharged from hospital.  The above assessment, treatment plan, treatment alternatives and goals were discussed and mutually agreed upon: by patient  Reche Ohara PT, DPT 11/28/2023, 10:22 AM

## 2023-11-28 NOTE — Plan of Care (Signed)
  Problem: RH Balance Goal: LTG Patient will maintain dynamic standing balance (PT) Description: LTG:  Patient will maintain dynamic standing balance with assistance during mobility activities (PT) Flowsheets (Taken 11/28/2023 1750) LTG: Pt will maintain dynamic standing balance during mobility activities with:: Supervision/Verbal cueing   Problem: Sit to Stand Goal: LTG:  Patient will perform sit to stand with assistance level (PT) Description: LTG:  Patient will perform sit to stand with assistance level (PT) Flowsheets (Taken 11/28/2023 1750) LTG: PT will perform sit to stand in preparation for functional mobility with assistance level: Supervision/Verbal cueing   Problem: RH Bed to Chair Transfers Goal: LTG Patient will perform bed/chair transfers w/assist (PT) Description: LTG: Patient will perform bed to chair transfers with assistance (PT). Flowsheets (Taken 11/28/2023 1750) LTG: Pt will perform Bed to Chair Transfers with assistance level: Supervision/Verbal cueing   Problem: RH Car Transfers Goal: LTG Patient will perform car transfers with assist (PT) Description: LTG: Patient will perform car transfers with assistance (PT). Flowsheets (Taken 11/28/2023 1750) LTG: Pt will perform car transfers with assist:: Supervision/Verbal cueing   Problem: RH Ambulation Goal: LTG Patient will ambulate in controlled environment (PT) Description: LTG: Patient will ambulate in a controlled environment, # of feet with assistance (PT). Flowsheets (Taken 11/28/2023 1750) LTG: Pt will ambulate in controlled environ  assist needed:: Supervision/Verbal cueing LTG: Ambulation distance in controlled environment: 150' Goal: LTG Patient will ambulate in home environment (PT) Description: LTG: Patient will ambulate in home environment, # of feet with assistance (PT). Flowsheets (Taken 11/28/2023 1750) LTG: Pt will ambulate in home environ  assist needed:: Supervision/Verbal cueing LTG: Ambulation distance  in home environment: 50'   Problem: RH Stairs Goal: LTG Patient will ambulate up and down stairs w/assist (PT) Description: LTG: Patient will ambulate up and down # of stairs with assistance (PT) Flowsheets (Taken 11/28/2023 1750) LTG: Pt will ambulate up/down stairs assist needed:: Supervision/Verbal cueing LTG: Pt will  ambulate up and down number of stairs: 12 steps to simulate entry to laundry area

## 2023-11-28 NOTE — Progress Notes (Signed)
 Inpatient Rehabilitation  Patient information reviewed and entered into eRehab system by [facility]. Karen Kays., CCC/SLP, PPS Coordinator.  Information including medical coding, functional ability and quality indicators will be reviewed and updated through discharge.

## 2023-11-28 NOTE — Progress Notes (Signed)
 Inpatient Rehabilitation Center Individual Statement of Services  Patient Name:  Daniel Herrera  Date:  11/28/2023  Welcome to the Inpatient Rehabilitation Center.  Our goal is to provide you with an individualized program based on your diagnosis and situation, designed to meet your specific needs.  With this comprehensive rehabilitation program, you will be expected to participate in at least 3 hours of rehabilitation therapies Monday-Friday, with modified therapy programming on the weekends.  Your rehabilitation program will include the following services:  Physical Therapy (PT), Occupational Therapy (OT), Speech Therapy (ST), 24 hour per day rehabilitation nursing, Therapeutic Recreaction (TR), Care Coordinator, Rehabilitation Medicine, Nutrition Services, and Pharmacy Services  Weekly team conferences will be held on Tuesday to discuss your progress.  Your Inpatient Rehabilitation Care Coordinator will talk with you frequently to get your input and to update you on team discussions.  Team conferences with you and your family in attendance may also be held.  Expected length of stay: 10-12 days  Overall anticipated outcome: Independent with device  Depending on your progress and recovery, your program may change. Your Inpatient Rehabilitation Care Coordinator will coordinate services and will keep you informed of any changes. Your Inpatient Rehabilitation Care Coordinator's name and contact numbers are listed  below.  The following services may also be recommended but are not provided by the Inpatient Rehabilitation Center:  Driving Evaluations Home Health Rehabiltiation Services Outpatient Rehabilitation Services    Arrangements will be made to provide these services after discharge if needed.  Arrangements include referral to agencies that provide these services.  Your insurance has been verified to be:  medicare Your primary doctor is:  Amy [name]  Pertinent information will be shared  with your doctor and your insurance company.  Inpatient Rehabilitation Care Coordinator:  Graeme Jude, KEN [phone] or ([phone]  Information discussed with and copy given to patient by: Raymonde Asberry MATSU, 11/28/2023, 1:20 PM

## 2023-11-28 NOTE — Plan of Care (Signed)
  Problem: RH Balance Goal: LTG Patient will maintain dynamic standing with ADLs (OT) Description: LTG:  Patient will maintain dynamic standing balance with assist during activities of daily living (OT)  Flowsheets (Taken 11/28/2023 1249) LTG: Pt will maintain dynamic standing balance during ADLs with: Independent with assistive device   Problem: RH Bathing Goal: LTG Patient will bathe all body parts with assist levels (OT) Description: LTG: Patient will bathe all body parts with assist levels (OT) Flowsheets (Taken 11/28/2023 1249) LTG: Pt will perform bathing with assistance level/cueing: Independent with assistive device    Problem: RH Dressing Goal: LTG Patient will perform upper body dressing (OT) Description: LTG Patient will perform upper body dressing with assist, with/without cues (OT). Flowsheets (Taken 11/28/2023 1249) LTG: Pt will perform upper body dressing with assistance level of: Independent with assistive device Goal: LTG Patient will perform lower body dressing w/assist (OT) Description: LTG: Patient will perform lower body dressing with assist, with/without cues in positioning using equipment (OT) Flowsheets (Taken 11/28/2023 1249) LTG: Pt will perform lower body dressing with assistance level of: Independent with assistive device   Problem: RH Toileting Goal: LTG Patient will perform toileting task (3/3 steps) with assistance level (OT) Description: LTG: Patient will perform toileting task (3/3 steps) with assistance level (OT)  Flowsheets (Taken 11/28/2023 1249) LTG: Pt will perform toileting task (3/3 steps) with assistance level: Independent with assistive device   Problem: RH Simple Meal Prep Goal: LTG Patient will perform simple meal prep w/assist (OT) Description: LTG: Patient will perform simple meal prep with assistance, with/without cues (OT). Flowsheets (Taken 11/28/2023 1249) LTG: Pt will perform simple meal prep with assistance level of: Independent with  assistive device   Problem: RH Toilet Transfers Goal: LTG Patient will perform toilet transfers w/assist (OT) Description: LTG: Patient will perform toilet transfers with assist, with/without cues using equipment (OT) Flowsheets (Taken 11/28/2023 1249) LTG: Pt will perform toilet transfers with assistance level of: Independent with assistive device   Problem: RH Tub/Shower Transfers Goal: LTG Patient will perform tub/shower transfers w/assist (OT) Description: LTG: Patient will perform tub/shower transfers with assist, with/without cues using equipment (OT) Flowsheets (Taken 11/28/2023 1249) LTG: Pt will perform tub/shower stall transfers with assistance level of: Independent with assistive device

## 2023-11-29 ENCOUNTER — Inpatient Hospital Stay (HOSPITAL_COMMUNITY)

## 2023-11-29 DIAGNOSIS — I6381 Other cerebral infarction due to occlusion or stenosis of small artery: Secondary | ICD-10-CM | POA: Diagnosis not present

## 2023-11-29 DIAGNOSIS — R9082 White matter disease, unspecified: Secondary | ICD-10-CM | POA: Diagnosis not present

## 2023-11-29 LAB — BASIC METABOLIC PANEL WITH GFR
Anion gap: 11 (ref 5–15)
BUN: 30 mg/dL — ABNORMAL HIGH (ref 8–23)
CO2: 21 mmol/L — ABNORMAL LOW (ref 22–32)
Calcium: 9.1 mg/dL (ref 8.9–10.3)
Chloride: 106 mmol/L (ref 98–111)
Creatinine, Ser: 1.78 mg/dL — ABNORMAL HIGH (ref 0.61–1.24)
GFR, Estimated: 37 mL/min — ABNORMAL LOW (ref >60)
Glucose, Bld: 106 mg/dL — ABNORMAL HIGH (ref 70–99)
Potassium: 3.9 mmol/L (ref 3.5–5.1)
Sodium: 138 mmol/L (ref 135–145)

## 2023-11-29 LAB — CBC
HCT: 36.2 % — ABNORMAL LOW (ref 39.0–52.0)
Hemoglobin: 12.7 g/dL — ABNORMAL LOW (ref 13.0–17.0)
MCH: 31.9 pg (ref 26.0–34.0)
MCHC: 35.1 g/dL (ref 30.0–36.0)
MCV: 91 fL (ref 80.0–100.0)
Platelets: 165 K/uL (ref 150–400)
RBC: 3.98 MIL/uL — ABNORMAL LOW (ref 4.22–5.81)
RDW: 12.4 % (ref 11.5–15.5)
WBC: 5.7 K/uL (ref 4.0–10.5)
nRBC: 0 % (ref 0.0–0.2)

## 2023-11-29 LAB — GLUCOSE, CAPILLARY
Glucose-Capillary: 119 mg/dL — ABNORMAL HIGH (ref 70–99)
Glucose-Capillary: 131 mg/dL — ABNORMAL HIGH (ref 70–99)
Glucose-Capillary: 128 mg/dL — ABNORMAL HIGH (ref 70–99)
Glucose-Capillary: 121 mg/dL — ABNORMAL HIGH (ref 70–99)

## 2023-11-29 MED ORDER — WHITE PETROLATUM EX OINT
TOPICAL_OINTMENT | Freq: Every day | CUTANEOUS | Status: DC
  Administered 2023-11-29 – 2023-12-02 (×2): 0.2 via TOPICAL
  Administered 2023-12-05: 1 via TOPICAL
  Administered 2023-12-06 – 2023-12-09 (×3): 0.2 via TOPICAL
  Filled 2023-11-29 (×2): qty 28.35

## 2023-11-29 MED ORDER — BISACODYL 5 MG PO TBEC
15.0000 mg | DELAYED_RELEASE_TABLET | Freq: Every day | ORAL | Status: DC
  Filled 2023-11-29: qty 3

## 2023-11-29 NOTE — Progress Notes (Signed)
 Patient reported numbness LUE to OT. Woke up with reports of numbness left shoulder and LUE feels numb as well as sensation of fullness left face/neck--denied having any numbness before. BP was a little low this am but now w/SBP in 140's per recheck. Chart reviewed and per notes had left sided numbness at admission and right thalamic stroke s/p TNK and subtle left vertex SAH. Able to sense touch on left and right--question improvement in sensation leading to feeling of fullness/numbness. No reports to Dr. Emeline on rounds this am.  CT head ordered for stroke follow up.

## 2023-11-29 NOTE — Progress Notes (Signed)
 PROGRESS NOTE   Subjective/Complaints:  No events overnight.  No acute complaints this a.m. states he did not get Vaseline last night for his nares.  States he was given report this a.m. that said that he no longer needs to use CPAP. Blood pressure soft overnight, improved this a.m. Blood sugars well-controlled. A.m. labs with stable BUN/creatinine.  Platelets normalized.    ROS: Denies fevers, chills, N/V, abdominal pain, constipation, diarrhea, SOB, cough, chest pain, new weakness or paraesthesias.    Objective:   No results found. Recent Labs    11/28/23 0427 11/29/23 0504  WBC 5.3 5.7  HGB 12.6* 12.7*  HCT 35.7* 36.2*  PLT 149* 165   Recent Labs    11/28/23 0427 11/29/23 0504  NA 137 138  K 3.8 3.9  CL 107 106  CO2 20* 21*  GLUCOSE 110* 106*  BUN 29* 30*  CREATININE 1.77* 1.78*  CALCIUM  9.0 9.1    Intake/Output Summary (Last 24 hours) at 11/29/2023 0854 Last data filed at 11/29/2023 0700 Gross per 24 hour  Intake 420 ml  Output 1000 ml  Net -580 ml        Physical Exam: Vital Signs Blood pressure 107/67, pulse 76, temperature (!) 97.4 F (36.3 C), resp. rate 14, height 6' 1 (1.854 m), weight 83.7 kg, SpO2 97%. Constitutional: No apparent distress. Appropriate appearance for age.  Sitting up in chair. HENT: No JVD. Neck Supple. Trachea midline. Atraumatic, normocephalic. Eyes: PERRLA. EOMI. Visual fields grossly intact.  Cardiovascular: RRR, no murmurs/rub/gallops. No Edema. Peripheral pulses 2+  Respiratory: CTAB. No rales, rhonchi, or wheezing. On RA.  Abdomen: + bowel sounds, normoactive. No distention or tenderness.  Skin: Scattered bruising on upper extremities.  Peripheral light be intact.   MSK:      No apparent deformity.  Neurologic exam:  Cognition: AAO to person, place, time and event.  Language: Fluent, No substitutions or neoglisms. No dysarthria. Names 3/3 objects correctly.   Memory: Recalls 3/3 objects at 5 minutes. No apparent deficits  Insight: Good  insight into current condition.  Mood: Pleasant affect, appropriate mood.  Sensation: Slightly decreased in bilateral lower extremities and left upper extremity--consistent with prior exams. Reflexes: 2+ in BL UE and LEs. Negative Hoffman's and babinski signs bilaterally.  CN: 2-12 grossly intact.  Coordination: Left upper extremity limb ataxia.  Not present in lower extremity. Spasticity: MAS 0 in all extremities.  Strength: 5 out of 5 right upper and lower extremity. Left upper extremity 4+ out of 5 throughout Left lower extremity 4+ out of 5 throughout        Assessment/Plan: 1. Functional deficits which require 3+ hours per day of interdisciplinary therapy in a comprehensive inpatient rehab setting. Physiatrist is providing close team supervision and 24 hour management of active medical problems listed below. Physiatrist and rehab team continue to assess barriers to discharge/monitor patient progress toward functional and medical goals  Care Tool:  Bathing    Body parts bathed by patient: Right arm, Chest, Left arm, Abdomen, Front perineal area, Buttocks, Right upper leg, Left upper leg, Right lower leg, Left lower leg, Face         Bathing assist Assist  Level: Minimal Assistance - Patient > 75% (For balance)     Upper Body Dressing/Undressing Upper body dressing   What is the patient wearing?: Pull over shirt    Upper body assist Assist Level: Set up assist    Lower Body Dressing/Undressing Lower body dressing      What is the patient wearing?: Pants, Underwear/pull up     Lower body assist Assist for lower body dressing: Minimal Assistance - Patient > 75%     Toileting Toileting    Toileting assist Assist for toileting: Minimal Assistance - Patient > 75%     Transfers Chair/bed transfer  Transfers assist     Chair/bed transfer assist level: Minimal Assistance - Patient  > 75%     Locomotion Ambulation   Ambulation assist      Assist level: Moderate Assistance - Patient 50 - 74% Assistive device: Walker-rolling Max distance: 60'   Walk 10 feet activity   Assist     Assist level: Minimal Assistance - Patient > 75% Assistive device: Walker-rolling   Walk 50 feet activity   Assist    Assist level: Moderate Assistance - Patient - 50 - 74% Assistive device: Walker-rolling    Walk 150 feet activity   Assist Walk 150 feet activity did not occur: Safety/medical concerns         Walk 10 feet on uneven surface  activity   Assist Walk 10 feet on uneven surfaces activity did not occur: Safety/medical concerns         Wheelchair     Assist Is the patient using a wheelchair?: Yes Type of Wheelchair: Manual    Wheelchair assist level: Supervision/Verbal cueing Max wheelchair distance: 200'    Wheelchair 50 feet with 2 turns activity    Assist        Assist Level: Supervision/Verbal cueing   Wheelchair 150 feet activity     Assist      Assist Level: Supervision/Verbal cueing   Blood pressure 107/67, pulse 76, temperature (!) 97.4 F (36.3 C), resp. rate 14, height 6' 1 (1.854 m), weight 83.7 kg, SpO2 97%.   Medical Problem List and Plan: 1. Functional deficits secondary to right thalamic infarct, small SAH post CABG             -he has made nice neurological gains, but still with sensory/propioceptive deficits and ataxia.              -patient may shower             -ELOS/Goals: 5-7 days, mod I to supervision   - Stable to continue inpatient rehab   - 8/28: Reports of increased numbness/tingling through his left arm and face late this morning after evaluation; repeat CT head unchanged.  Neurology consult placed.  2.  Antithrombotics: -DVT/anticoagulation:  Mechanical: Sequential compression devices, below knee Bilateral lower extremities             -antiplatelet therapy: Aspirin  81 mg (hx of  bleeding on plavix )   3. Pain Management: Arthritis bilat hands and ankles: request home med nabumetone  750 mg---will resume   - No complaints of pain 8-27  4. Mood/Behavior/Sleep: LCSW to follow for evaluation and support when available.              -antipsychotic agents:  Does not want Buspar  makes me feel horrible --discontinue as needed.  Denies need of anything for anxiety.             -Insomnia: Requesting home  Temazepam  15 mg for insomnia-ordered --using as needed, with significant benefit             5. Neuropsych/cognition: This patient is capable of making decisions on his own behalf. 6. Skin/Wound Care: routine pressure relief measures  7. Fluids/Electrolytes/Nutrition: monitor I&O and follow up chemistries              -Heart healthy diet   8. Hereditary angioedema: Patient supplied med (icatibant ) is in pharmacy fridge-patient had 1 episode of angioedema on 8/25 which was successfully treated with icatibant  PRN.              -uses takhzyro  inj sq every 2 weeks at home  9. CAD s/p stenting HLD: LDL 106 on Pitavastatin  4 mg daily and Zetia               -Start Leqvio as outpatient therapy             -see meds below for PAF/HTN/CHF             -nitro prn CP  10. HTN/CHF: norvasc  and losartan               -bp goal of less than 180/105   - Blood pressure well-controlled  8-28: Blood pressure soft overnight and this a.m.; will get orthostatic vitals.  Will get daily weights for CHF.    11/29/2023    8:04 AM 11/29/2023    4:42 AM 11/28/2023    8:33 PM  Vitals with BMI  Systolic 107 137 884  Diastolic 67 67 56  Pulse 76 73 60    Filed Weights   11/27/23 1529  Weight: 83.7 kg    11. T2DM: A1c 5.6    - Blood sugars well-controlled on current regimen Recent Labs    11/28/23 1612 11/28/23 2120 11/29/23 0615  GLUCAP 162* 137* 119*     12. Post op PAF after CABG             -HR now controlled, regular             -metoprolol  xl 25mg  daily,  13. CKD IIIb. Cr has been  stable around  1.8   - Creatinine 1.77, stable.  Monitor . - 8/28: BUN/creatinine stable.  14. Hx of prostate cancer/hematuria 15. Hx hypothyroid--levothyroxine   16. Hx of interstitial lung disease/allergies/OSA   - Sleep study performed downstairs, started on CPAP             -albuterol  prn             -flonase  and azelastine  nasal sprays bid   - Add petroleum jelly to nares as needed before placing nasal pillow--ordered standing 8-28  - 8-28: Patient reports that final sleep study stated that he does not need CPAP.  Bedside educational manual seems to just be general information on stroke, but patient is insistent that he cannot tolerate it and will not use it for now on.  LOS: 2 days A FACE TO FACE EVALUATION WAS PERFORMED  Daniel Herrera Likes 11/29/2023, 8:54 AM

## 2023-11-29 NOTE — Progress Notes (Signed)
 Physical Therapy Session Note  Patient Details  Name: Daniel Herrera MRN: [id number] Date of Birth: 1941-01-07  Today's Date: 11/29/2023 PT Individual Time: [phone] + 1345-1430 PT Individual Time Calculation (min): 70 min + 45 min  Short Term Goals: Week 1:  PT Short Term Goal 1 (Week 1): Pt will complete transfers with CGA consistently with LRAD PT Short Term Goal 2 (Week 1): Pt will complete gait iwth LRAD with CGA 150' PT Short Term Goal 3 (Week 1): Pt will complete stair negotiation with CGA and min verbal cues for LLE awareness  Skilled Therapeutic Interventions/Progress Updates:    SESSION 1: Pt presents in room in bed, agreeable to PT. Pt does not report pain. Session focused on therapeutic activities to facilitate participation with self care tasks as well as gait training and NMR for LLE awareness and coordination as well as dynamic standing balance. Pt completes bed mobility with superviison, increased time. Pt completes sit to stand to RW with CGA, x2 attempts, and ambulates with RW into bathroom with min assist and cues for upright posture and proximity to RW. Pt completes toilet transfer with CGA, 3/3 toileting tasks with CGA. Pt ambualtes to sink with cues for RW management up to sink. Pt demonstrates crouching at bilateral knees and forearm propping at sink with increased time with cues provided for upright posture. Pt comes to sitting in WC. Pt self propels WC with BUEs to day room, completed as NMR to promote BUE coordination. Pt then ambulates with RW 180' with min/mod assist and cues for LLE step length, upright posture and R weight shift. Pt completes forward/backward gait 3x10' with mirror positioned in front of pt to promote upright posture as well as awareness of LLE positioning. Pt then completes step taps with BLE with mirror positioned in front, cues for postural awareness with emphasis on midline orientation as pt demonstrating decreased weight shift to R side as well  as LLE coordination and foot placement, pt completes 2x10 BLE and x20 alternating BLE. Pt completes transfer back to WC. Pt returns to room and remains seated in [facility] with all needs within reach, cal light in place and chair alarm donned and activated at end of session.   SESSION 2: Pt presents in room in bed, requesting time to complete urination in urinal, therapist returns to room at 1345 with pt agreeable to PT. Pt missing 15 min of 60 min session due to toileting. Pt reporting having some pain and new numbness in LUE/LLE and L side of face that seems to have resolved, reports having had CT with no new infarcts noted. Pt agreeable to little bit of PT. Session focused on therapeutic activities for education on effects of therapeutic exercise on body as well as transfer training, and NMR for BUE/BLE coordination. Pt completes bed mobility slowly with supervision. Pt completes stand step transfers throughout session without device with min/mod assist and mod verbal cues for LLE foot placement, completed for increased challenge to postural stability as well as encouragement BLE weightbearing to decrease UE reliance on RW. Pt transported to day room, completes transfer to nustep. Pt completes continuous training on nustep with BUE/BLE 14 min on L1, cues to maintain 60 SPM with pt using pace partner program to improve coordination with task as well as promote BUE/BLE muscle fiber recruitment. Pt reports feeling better following activity on nustep. Pt returned to room and returns to bed. Pt completes bed mobility with supervision. Pt remains semi reclined in bed with all needs  within reach, call light in place, and bed alarm activated at end of session.     Therapy Documentation Precautions:  Precautions Precautions: Fall Recall of Precautions/Restrictions: Impaired Restrictions Weight Bearing Restrictions Per Provider Order: No   Therapy/Group: Individual Therapy  Reche Ohara PT,  DPT 11/29/2023, 9:18 AM

## 2023-11-29 NOTE — Plan of Care (Signed)
  Problem: Consults Goal: RH STROKE PATIENT EDUCATION Description: See Patient Education module for education specifics  Outcome: Progressing   Problem: RH BLADDER ELIMINATION Goal: RH STG MANAGE BLADDER WITH ASSISTANCE Description: STG Manage Bladder With mod I  Assistance Outcome: Progressing   Problem: RH SAFETY Goal: RH STG ADHERE TO SAFETY PRECAUTIONS W/ASSISTANCE/DEVICE Description: STG Adhere to Safety Precautions With cues Assistance/Device. Outcome: Progressing   Problem: RH KNOWLEDGE DEFICIT Goal: RH STG INCREASE KNOWLEDGE OF DIABETES Description: Patient and fspouse will be able to manage dm using educational resources for medications and dietary modification independently Outcome: Progressing Goal: RH STG INCREASE KNOWLEDGE OF HYPERTENSION Description: Patient and spouse will be able to manage HTN using educational resources for medications and dietary modification independently Outcome: Progressing Goal: RH STG INCREASE KNOWLEGDE OF HYPERLIPIDEMIA Description: Patient and spouse will be able to manage HLD using educational resources for medications and dietary modification independently Outcome: Progressing Goal: RH STG INCREASE KNOWLEDGE OF STROKE PROPHYLAXIS Description: Patient and spouse will be able to manage secondary risks using educational resources for medications and dietary modification independently Outcome: Progressing

## 2023-11-29 NOTE — Progress Notes (Signed)
 Occupational Therapy Session Note  Patient Details  Name: Daniel Herrera MRN: [id number] Date of Birth: [birth year]  Today's Date: 11/29/2023 OT Individual Time: 9064-8961 OT Individual Time Calculation (min): 63 min  and Today's Date: 11/29/2023 OT Missed Time: 10 Minutes Missed Time Reason: Nursing care   Short Term Goals: Week 1:  OT Short Term Goal 1 (Week 1): STGs=LTGs due to patient's estimated length of stay.  Skilled Therapeutic Interventions/Progress Updates:  Pt greeted sitting in [facility] for skilled OT session with focus on functional transfers, standing balance, . MD rounding during session.   Pain: Pt initially reports increased stiffness in LUE, OT assess ROM with patient demo's increased difficulty with elevation, otherwise same to presentation on eval. OT offering intermediate rest breaks and positioning suggestions throughout session to address pain/fatigue and maximize participation/safety in session.   Functional Transfers: Sit<>stands with CGA-Min A, short-distance ambulation with Min A + heavy multimodal cuing for safe RW proximity and attention to LLE.   Therapeutic Activities: Standing table top activity attempting targeting LUE FMC/management of ataxic movements, pt requires Min-Mod A for standing balance while dividing attention while task, therefore activity downgraded to focus on the above skills individually. While seated, pt instructed in large pegboard activity, 1.5# wrist weight added for management of ataxic movements. Mass repetitions completed for NMR, OT provides support proximally as needed.   Pt then reports increase in LUE numbness/stiffness. PA made aware and patient returned to room for further evaluation. Pt missing ~10 mins of skilled intervention as a result.   Pt remained sitting in WC, LPN/PA present in room, 4Ps assessed and immediate needs met. Pt continues to be appropriate for skilled OT intervention to promote further functional independence in  ADLs/IADLs.   Therapy Documentation Precautions:  Precautions Precautions: Fall Recall of Precautions/Restrictions: Impaired Restrictions Weight Bearing Restrictions Per Provider Order: No   Therapy/Group: Individual Therapy  Nereida Habermann, OTR/L, MSOT  11/29/2023, 6:23 AM

## 2023-11-29 NOTE — Progress Notes (Incomplete)
 NEUROLOGY CONSULT FOLLOW UP NOTE   Date of service: November 29, 2023 Patient Name: Daniel Herrera MRN:  [id number] DOB:  1940-09-09  Interval Hx/subjective   Called back to see patient due to c/o left facial numbness, LUE to shoulder numbness and pain and worsening left side weakness. He states he woke up with this new pain and numbness this morning and then got worse while working with therapy. He states the numbness is improving but still present more so in his left shoulder and in front of his ear and he has continued pain in his left shoulder/ neck area. He also feels as if his speech is off Repeat CT head with no acute process.   Vitals   Vitals:   11/28/23 2033 11/29/23 0442 11/29/23 0804 11/29/23 1100  BP: (!) 115/56 137/67 107/67 (!) 140/73  Pulse: 60 73 76 69  Resp:  14    Temp:  (!) 97.4 F (36.3 C)    TempSrc:      SpO2:  97%    Weight:      Height:         Body mass index is 24.35 kg/m.  Physical Exam   Constitutional: Appears well-developed and well-nourished.   Psych: Affect appropriate to situation.   Eyes: No scleral injection.   HENT: No OP obstrucion.   Head: Normocephalic.   Cardiovascular: Normal rate and regular rhythm.   Respiratory: Effort normal, non-labored breathing.   GI: Soft.  No distension. There is no tenderness.   Skin: WDI.    Neurologic Examination   Mental Status -  Level of arousal and orientation to time, place, and person were intact. Language including expression, naming, repetition, comprehension was assessed and found intact. Attention span and concentration were normal. Recent and remote memory were intact. Fund of Knowledge was assessed and was intact.  Cranial Nerves II - XII - II - Visual field intact OU. III, IV, VI - Extraocular movements intact. V - left side face in front of ear  VII - Facial movement intact bilaterally. VIII - Hearing & vestibular intact bilaterally. X - Palate elevates symmetrically. XI -  Chin turning & shoulder shrug intact bilaterally. XII - Tongue protrusion intact.  Motor Strength - left arm with drift and left leg with drift.  Right arm and right leg with no drift 5/5.Bulk was normal and fasciculations were absent .   Motor Tone - Muscle tone was assessed at the neck and appendages and was normal . Sensory - decreased on left arm and left leg  Coordination - ataxia on left arm  Gait and Station - deferred.  Medications  Current Facility-Administered Medications:    alum & mag hydroxide-simeth (MAALOX/MYLANTA) 200-200-20 MG/5ML suspension 30 mL, 30 mL, Oral, Q4H PRN, Leak, Brandi L, NP   amLODipine  (NORVASC ) tablet 2.5 mg, 2.5 mg, Oral, Daily, Leak, Brandi L, NP, 2.5 mg at 11/29/23 0804   artificial tears ophthalmic solution 1 drop, 1 drop, Both Eyes, PRN, Leak, Brandi L, NP, 1 drop at 11/28/23 2035   aspirin  EC tablet 81 mg, 81 mg, Oral, Daily, Leak, Brandi L, NP, 81 mg at 11/29/23 0804   bisacodyl  (DULCOLAX) suppository 10 mg, 10 mg, Rectal, Daily PRN, Leak, Brandi L, NP   ezetimibe  (ZETIA ) tablet 10 mg, 10 mg, Oral, Daily, Leak, Brandi L, NP, 10 mg at 11/29/23 0804   fluticasone  (FLONASE ) 50 MCG/ACT nasal spray 1 spray, 1 spray, Each Nare, BID, Leak, Brandi L, NP, 1 spray at 11/29/23 [phone]  guaiFENesin -dextromethorphan  (ROBITUSSIN DM) 100-10 MG/5ML syrup 5-10 mL, 5-10 mL, Oral, Q6H PRN, Leak, Brandi L, NP   levothyroxine  (SYNTHROID ) tablet 100 mcg, 100 mcg, Oral, QAC breakfast, Leak, Brandi L, NP, 100 mcg at 11/29/23 0605   losartan  (COZAAR ) tablet 25 mg, 25 mg, Oral, Daily, Leak, Brandi L, NP, 25 mg at 11/29/23 9195   metoprolol  succinate (TOPROL -XL) 24 hr tablet 25 mg, 25 mg, Oral, BID, Leak, Brandi L, NP, 25 mg at 11/29/23 0804   pantoprazole  (PROTONIX ) EC tablet 40 mg, 40 mg, Oral, BID, Leak, Brandi L, NP, 40 mg at 11/29/23 0804   Pitavastatin  Calcium  TABS 4 mg, 4 mg, Oral, Daily, Emeline, Morgan C, DO, 4 mg at 11/29/23 9195   prochlorperazine  (COMPAZINE ) tablet  5-10 mg, 5-10 mg, Oral, Q6H PRN **OR** prochlorperazine  (COMPAZINE ) suppository 12.5 mg, 12.5 mg, Rectal, Q6H PRN **OR** prochlorperazine  (COMPAZINE ) injection 5-10 mg, 5-10 mg, Intravenous, Q6H PRN, Leak, Brandi L, NP   sodium phosphate  (FLEET) enema 1 enema, 1 enema, Rectal, Once PRN, Leak, Brandi L, NP   temazepam  (RESTORIL ) capsule 15 mg, 15 mg, Oral, QHS PRN, Leak, Brandi L, NP, 15 mg at 11/28/23 2032  Labs and Diagnostic Imaging   CBC:  Recent Labs  Lab 11/23/23 1814 11/23/23 1815 11/28/23 0427 11/29/23 0504  WBC 5.8   < > 5.3 5.7  NEUTROABS 4.0  --  3.6  --   HGB 13.4   < > 12.6* 12.7*  HCT 39.7   < > 35.7* 36.2*  MCV 94.7   < > 91.3 91.0  PLT 182   < > 149* 165   < > = values in this interval not displayed.    Basic Metabolic Panel:  Lab Results  Component Value Date   NA 138 11/29/2023   K 3.9 11/29/2023   CO2 21 (L) 11/29/2023   GLUCOSE 106 (H) 11/29/2023   BUN 30 (H) 11/29/2023   CREATININE 1.78 (H) 11/29/2023   CALCIUM  9.1 11/29/2023   GFRNONAA 37 (L) 11/29/2023   GFRAA 51 (L) 06/06/2019   Lipid Panel:  Lab Results  Component Value Date   LDLCALC 106 (H) 11/24/2023   HgbA1c:  Lab Results  Component Value Date   HGBA1C 5.6 11/23/2023   Urine Drug Screen:     Component Value Date/Time   LABOPIA NONE DETECTED 11/23/2023 2033   COCAINSCRNUR NONE DETECTED 11/23/2023 2033   LABBENZ POSITIVE (A) 11/23/2023 2033   AMPHETMU NONE DETECTED 11/23/2023 2033   THCU NONE DETECTED 11/23/2023 2033   LABBARB NONE DETECTED 11/23/2023 2033    Alcohol  Level     Component Value Date/Time   [facility] <15 11/23/2023 1825   INR  Lab Results  Component Value Date   INR 1.0 11/23/2023   APTT  Lab Results  Component Value Date   APTT 31 11/23/2023   AED levels: No results found for: PHENYTOIN, ZONISAMIDE, LAMOTRIGINE, LEVETIRACETA  Repeat Ct head    CT angio Head and Neck with contrast(Personally reviewed): 1. Lacunar infarct in the right posterolateral  thalamus and posterior limb of the internal capsule, again demonstrated. 2. Mild-to-moderate periventricular white matter disease. 3. Moderate opacification of the left maxillary sinus.    Assessment   ARMISTEAD SULT is a 83 y.o. male  83 y.o. male with history of hereditary angioedema type II, PAF on ASA, HTN, HLD, CAD s/p CABG, MI, prior stroke, CHF, CKD, GERD, hx of GI bleed, history of prostate cancer, hypothyroidism, diabetes who presented on 8/22 with acute left side  weakness and received IV TNK. He was admitted to CIR on 8/26 and this morning c/o left side numbness, increased left side weakness and pain of left shoulder. Repeat CT head with no acute process   Recommendations   - Continue ASA - continue statin  - PT/OT  _____________________________________________________________________   Signed, Karna DELENA Geralds, NP [facility]   Attending Neurohospitalist Addendum Patient seen and examined with APP/Resident. Agree with the history and physical as documented above. Agree with the plan as documented, which I helped formulate. I have edited the note above to reflect my full findings and recommendations. I have independently reviewed the chart, obtained history, review of systems and examined the patient.I have personally reviewed pertinent head/neck/spine imaging (CT/MRI). Please feel free to call with any questions.  Patient admitted for thalamic stroke. On my exam does not feel that his numbness is worse. Repeat head CT no acute process. No further inpatient stroke workup at this time. Please reach out if any additional neurologic concerns arise.  -- Elida Ross, MD [facility] [phone]  If 7pm- 7am, please page neurology on call as listed in AMION.

## 2023-11-30 ENCOUNTER — Encounter (HOSPITAL_COMMUNITY): Payer: Self-pay | Admitting: Physical Medicine and Rehabilitation

## 2023-11-30 DIAGNOSIS — E1122 Type 2 diabetes mellitus with diabetic chronic kidney disease: Secondary | ICD-10-CM

## 2023-11-30 DIAGNOSIS — N1832 Chronic kidney disease, stage 3b: Secondary | ICD-10-CM

## 2023-11-30 DIAGNOSIS — I509 Heart failure, unspecified: Secondary | ICD-10-CM

## 2023-11-30 DIAGNOSIS — K5900 Constipation, unspecified: Secondary | ICD-10-CM

## 2023-11-30 LAB — GLUCOSE, CAPILLARY
Glucose-Capillary: 106 mg/dL — ABNORMAL HIGH (ref 70–99)
Glucose-Capillary: 114 mg/dL — ABNORMAL HIGH (ref 70–99)
Glucose-Capillary: 130 mg/dL — ABNORMAL HIGH (ref 70–99)
Glucose-Capillary: 164 mg/dL — ABNORMAL HIGH (ref 70–99)

## 2023-11-30 MED ORDER — LANADELUMAB-FLYO 300 MG/2ML ~~LOC~~ SOLN: SUBCUTANEOUS | Status: DC

## 2023-11-30 MED ORDER — NON FORMULARY: 300.0000 mg | Status: DC

## 2023-11-30 MED ORDER — ICATIBANT ACETATE 30 MG/3ML ~~LOC~~ SOSY: 30.0000 mg | PREFILLED_SYRINGE | Freq: Once | SUBCUTANEOUS | Status: DC | PRN

## 2023-11-30 MED ORDER — BISACODYL 5 MG PO TBEC
15.0000 mg | DELAYED_RELEASE_TABLET | Freq: Every evening | ORAL | Status: DC | PRN
  Administered 2023-12-01: 15 mg via ORAL
  Administered 2023-12-02: 10 mg via ORAL
  Administered 2023-12-03: 15 mg via ORAL
  Administered 2023-12-05: 10 mg via ORAL
  Administered 2023-12-07: 15 mg via ORAL
  Filled 2023-11-30 (×3): qty 3

## 2023-11-30 NOTE — IPOC Note (Signed)
 Overall Plan of Care [facility]) Patient Details Name: Daniel Herrera MRN: [id number] DOB: Jul 28, 1940  Admitting Diagnosis: CVA (cerebral vascular accident) [facility])  Hospital Problems: Principal Problem:   CVA (cerebral vascular accident) [facility])     Functional Problem List: Nursing Bladder, Safety, Endurance, Medication Management  PT Balance, Behavior, Endurance, Motor, Perception, Safety, Sensory  OT Balance, Endurance, Motor, Safety, Perception  SLP    TR         Basic ADL's: OT Bathing, Dressing, Toileting     Advanced  ADL's: OT Simple Meal Preparation     Transfers: PT Bed Mobility, Bed to Chair, Car, Occupational psychologist, Research scientist (life sciences): PT Stairs, Ambulation, Psychologist, prison and probation services     Additional Impairments: OT Fuctional Use of Upper Extremity  SLP        TR      Anticipated Outcomes Item Anticipated Outcome  Self Feeding    Swallowing      Basic self-care  Mod I  Toileting  Mod I   Bathroom Transfers Mod I  Bowel/Bladder  manage bladder w mod I assist  Transfers  supervision  Locomotion  supervision ambulatory  Communication     Cognition     Pain  n/a  Safety/Judgment  manage safety w cues   Therapy Plan: PT Intensity: Minimum of 1-2 x/day ,45 to 90 minutes PT Frequency: 5 out of 7 days PT Duration Estimated Length of Stay: 10-12 days OT Intensity: Minimum of 1-2 x/day, 45 to 90 minutes OT Frequency: 5 out of 7 days OT Duration/Estimated Length of Stay: 10-12 days     Team Interventions: Nursing Interventions Patient/Family Education, Medication Management, Bladder Management, Disease Management/Prevention, Discharge Planning  PT interventions Ambulation/gait training, Discharge planning, Functional mobility training, Psychosocial support, Therapeutic Activities, Visual/perceptual remediation/compensation, Balance/vestibular training, Disease management/prevention, Neuromuscular re-education, Therapeutic Exercise, Wheelchair  propulsion/positioning, Skin care/wound management, Cognitive remediation/compensation, DME/adaptive equipment instruction, Pain management, Splinting/orthotics, UE/LE Strength taining/ROM, Community reintegration, Equities trader education, Museum/gallery curator, UE/LE Coordination activities  OT Interventions Cognitive remediation/compensation, Warden/ranger, Firefighter, Discharge planning, Disease mangement/prevention, Fish farm manager, Functional electrical stimulation, Functional mobility training, Neuromuscular re-education, Pain management, Patient/family education, Psychosocial support, Self Care/advanced ADL retraining, Skin care/wound managment, Splinting/orthotics, Therapeutic Activities, Therapeutic Exercise, UE/LE Strength taining/ROM, UE/LE Coordination activities, Visual/perceptual remediation/compensation, Wheelchair propulsion/positioning  SLP Interventions    TR Interventions    SW/CM Interventions Discharge Planning, Psychosocial Support, Patient/Family Education   Barriers to Discharge MD  Medical stability, Home enviroment access/loayout, and Lack of/limited family support  Nursing Decreased caregiver support 1 level ramped entry w spouse  PT Decreased caregiver support pt wife had recent stroke in April, unable to physically assist  OT Decreased caregiver support    SLP      SW Decreased caregiver support     Team Discharge Planning: Destination: PT-Home ,OT- Home , SLP-Home Projected Follow-up: PT-Outpatient PT, OT-  Outpatient OT, SLP-None Projected Equipment Needs: PT-None recommended by PT, OT- To be determined, SLP-None recommended by SLP Equipment Details: PT- , OT-  Patient/family involved in discharge planning: PT- Patient,  OT-Patient, SLP-Patient  MD ELOS: 5-7 days Medical Rehab Prognosis:  Excellent Assessment: The patient has been admitted for CIR therapies with the diagnosis of CVA. The team will be addressing  functional mobility, strength, stamina, balance, safety, adaptive techniques and equipment, self-care, bowel and bladder mgt, patient and caregiver education,. Goals have been set at supervision PT/OT. Anticipated discharge destination is home.       See Team Conference Notes for  weekly updates to the plan of care

## 2023-11-30 NOTE — Plan of Care (Signed)
  Problem: Consults Goal: RH STROKE PATIENT EDUCATION Description: See Patient Education module for education specifics  Outcome: Progressing   Problem: RH BLADDER ELIMINATION Goal: RH STG MANAGE BLADDER WITH ASSISTANCE Description: STG Manage Bladder With mod I  Assistance Outcome: Progressing   Problem: RH SAFETY Goal: RH STG ADHERE TO SAFETY PRECAUTIONS W/ASSISTANCE/DEVICE Description: STG Adhere to Safety Precautions With cues Assistance/Device. Outcome: Progressing   Problem: RH KNOWLEDGE DEFICIT Goal: RH STG INCREASE KNOWLEDGE OF HYPERTENSION Description: Patient and spouse will be able to manage HTN using educational resources for medications and dietary modification independently Outcome: Progressing Goal: RH STG INCREASE KNOWLEDGE OF STROKE PROPHYLAXIS Description: Patient and spouse will be able to manage secondary risks using educational resources for medications and dietary modification independently Outcome: Progressing

## 2023-11-30 NOTE — Progress Notes (Addendum)
 PROGRESS NOTE   Subjective/Complaints: No new concerns today.  Had CT scan completed yesterday of his brain after patient had complaint of left upper extremity numbness.  On chart review it was noted that left-sided numbness was present on admission.  CT head was done yesterday without acute changes but again demonstrated lacunar infarct in right posterior lateral thalamus and posterior limb of internal capsule. Pt was seen by neurology- appreciate assistance. No new concerns this morning. + BM today  ROS: Denies fevers, chills, N/V, abdominal pain, constipation, SOB, diarrhea, cough, chest pain, new weakness or paraesthesias.     Objective:   CT HEAD WO CONTRAST ( ) Result Date: 11/29/2023 EXAM: CT HEAD WITHOUT CONTRAST 11/29/2023 11:22:17 AM TECHNIQUE: CT of the head was performed without the administration of intravenous contrast. Automated exposure control, iterative reconstruction, and/or weight based adjustment of the mA/kV was utilized to reduce the radiation dose to as low as reasonably achievable. COMPARISON: CT of the head dated 11/25/2023. CLINICAL HISTORY: Numbness LUE. FINDINGS: BRAIN AND VENTRICLES: No acute hemorrhage. Gray-white differentiation is preserved. No hydrocephalus. No extra-axial collection. No mass effect or midline shift. Lacunar infarct again demonstrated in the right posterolateral thalamus and posterior limb of the internal capsule. Small-to-moderate periventricular white matter disease. ORBITS: The patient is status post bilateral lens replacement. SINUSES: Moderate opacification of the left maxillary sinus. SOFT TISSUES AND SKULL: No acute soft tissue abnormality. No skull fracture. Extensive calcifications within the carotid siphons. IMPRESSION: 1. Lacunar infarct in the right posterolateral thalamus and posterior limb of the internal capsule, again demonstrated. 2. Mild-to-moderate periventricular white  matter disease. 3. Moderate opacification of the left maxillary sinus. Electronically signed by: Evalene Coho MD 11/29/2023 11:32 AM EDT RP Workstation: [id number]   Recent Labs    11/28/23 0427 11/29/23 0504  WBC 5.3 5.7  HGB 12.6* 12.7*  HCT 35.7* 36.2*  PLT 149* 165   Recent Labs    11/28/23 0427 11/29/23 0504  NA 137 138  K 3.8 3.9  CL 107 106  CO2 20* 21*  GLUCOSE 110* 106*  BUN 29* 30*  CREATININE 1.77* 1.78*  CALCIUM  9.0 9.1    Intake/Output Summary (Last 24 hours) at 11/30/2023 1627 Last data filed at 11/30/2023 0654 Gross per 24 hour  Intake 240 ml  Output 1100 ml  Net -860 ml        Physical Exam: Vital Signs Blood pressure 136/66, pulse 67, temperature 97.8 F (36.6 C), resp. rate 16, height 6' 1 (1.854 m), weight 83.7 kg, SpO2 100%. Constitutional: No apparent distress. Appropriate appearance for age.  Working with therapy in his room HENT: No JVD. Neck Supple. Trachea midline. Atraumatic, normocephalic. Eyes: PERRLA. EOMI. Visual fields grossly intact.  Cardiovascular: RRR, no murmurs/rub/gallops. No Edema. Peripheral pulses 2+  Respiratory: CTAB. No rales, rhonchi, or wheezing. On RA.  Abdomen: + bowel sounds, normoactive. No distention or tenderness.  Skin: Scattered bruising on upper extremities.  Peripheral light be intact.   MSK:      No apparent deformity.  Neurologic exam:  Cognition: AAO to person, place, time and event.  Language: Fluent, No substitutions or neoglisms. No dysarthria. Names 3/3 objects correctly.  Memory: Recalls 3/3  objects at 5 minutes. No apparent deficits  Insight: Good  insight into current condition.  Mood: Pleasant affect, appropriate mood.  Sensation: Slightly decreased in bilateral lower extremities and left upper extremity--consistent with prior exams. Reflexes: 2+ in BL UE and LEs. Negative Hoffman's and babinski signs bilaterally.  CN: 2-12 grossly intact.  Coordination: Left upper extremity limb ataxia.   Not present in lower extremity. Spasticity: MAS 0 in all extremities.  Strength: 5 out of 5 right upper and lower extremity. Left upper extremity 4+ out of 5 throughout Left lower extremity 4+ out of 5 throughout    Prior neuro assessment is c/w today's exam 11/30/2023.      Assessment/Plan: 1. Functional deficits which require 3+ hours per day of interdisciplinary therapy in a comprehensive inpatient rehab setting. Physiatrist is providing close team supervision and 24 hour management of active medical problems listed below. Physiatrist and rehab team continue to assess barriers to discharge/monitor patient progress toward functional and medical goals  Care Tool:  Bathing    Body parts bathed by patient: Right arm, Chest, Left arm, Abdomen, Front perineal area, Buttocks, Right upper leg, Left upper leg, Right lower leg, Left lower leg, Face         Bathing assist Assist Level: Minimal Assistance - Patient > 75% (For balance)     Upper Body Dressing/Undressing Upper body dressing   What is the patient wearing?: Pull over shirt    Upper body assist Assist Level: Set up assist    Lower Body Dressing/Undressing Lower body dressing      What is the patient wearing?: Pants, Underwear/pull up     Lower body assist Assist for lower body dressing: Minimal Assistance - Patient > 75%     Toileting Toileting    Toileting assist Assist for toileting: Minimal Assistance - Patient > 75%     Transfers Chair/bed transfer  Transfers assist     Chair/bed transfer assist level: Minimal Assistance - Patient > 75%     Locomotion Ambulation   Ambulation assist      Assist level: Moderate Assistance - Patient 50 - 74% Assistive device: Walker-rolling Max distance: 60'   Walk 10 feet activity   Assist     Assist level: Minimal Assistance - Patient > 75% Assistive device: Walker-rolling   Walk 50 feet activity   Assist    Assist level: Moderate Assistance  - Patient - 50 - 74% Assistive device: Walker-rolling    Walk 150 feet activity   Assist Walk 150 feet activity did not occur: Safety/medical concerns         Walk 10 feet on uneven surface  activity   Assist Walk 10 feet on uneven surfaces activity did not occur: Safety/medical concerns         Wheelchair     Assist Is the patient using a wheelchair?: Yes Type of Wheelchair: Manual    Wheelchair assist level: Supervision/Verbal cueing Max wheelchair distance: 200'    Wheelchair 50 feet with 2 turns activity    Assist        Assist Level: Supervision/Verbal cueing   Wheelchair 150 feet activity     Assist      Assist Level: Supervision/Verbal cueing   Blood pressure 136/66, pulse 67, temperature 97.8 F (36.6 C), resp. rate 16, height 6' 1 (1.854 m), weight 83.7 kg, SpO2 100%.   Medical Problem List and Plan: 1. Functional deficits secondary to right thalamic infarct, small SAH post CABG             -  he has made nice neurological gains, but still with sensory/propioceptive deficits and ataxia.              -patient may shower             -ELOS/Goals: 5-7 days, mod I to supervision   - Stable to continue inpatient rehab  - 8/28: Reports of increased numbness/tingling through his left arm and face late this morning after evaluation; repeat CT head unchanged.  Neurology consult placed.  2.  Antithrombotics: -DVT/anticoagulation:  Mechanical: Sequential compression devices, below knee Bilateral lower extremities             -antiplatelet therapy: Aspirin  81 mg (hx of bleeding on plavix )   3. Pain Management: Arthritis bilat hands and ankles: request home med nabumetone  750 mg---will resume   - No complaints of pain 8-27  4. Mood/Behavior/Sleep: LCSW to follow for evaluation and support when available.              -antipsychotic agents:  Does not want Buspar  makes me feel horrible --discontinue as needed.  Denies need of anything for  anxiety.             -Insomnia: Requesting home Temazepam  15 mg for insomnia-ordered --using as needed, with significant benefit             5. Neuropsych/cognition: This patient is capable of making decisions on his own behalf. 6. Skin/Wound Care: routine pressure relief measures  7. Fluids/Electrolytes/Nutrition: monitor I&O and follow up chemistries              -Heart healthy diet   8. Hereditary angioedema: Patient supplied med (icatibant ) is in pharmacy fridge-patient had 1 episode of angioedema on 8/25 which was successfully treated with icatibant  PRN.              -uses takhzyro  inj sq every 2 weeks at home   - 8/28: Family brought Takhzyro  form home - reviewed med data sheet, no contraindications with stroke or interactions with current meds - ordered for every other Friday (pharmacy will need to confirm he did not get it day of admission 8/22)  9. CAD s/p stenting HLD: LDL 106 on Pitavastatin  4 mg daily and Zetia               -Start Leqvio as outpatient therapy             -see meds below for PAF/HTN/CHF             -nitro prn CP  10. HTN/CHF: norvasc  and losartan               -bp goal of less than 180/105   - Blood pressure well-controlled  8-28: Blood pressure soft overnight and this a.m.; will get orthostatic vitals.  Will get daily weights for CHF.  8/29 BP stable continue current regimen and monitor/follow asked nursing to check weights, no signs of overload    11/30/2023    1:48 PM 11/30/2023    8:07 AM 11/30/2023    4:48 AM  Vitals with BMI  Systolic 136 134 872  Diastolic 66 83 64  Pulse 67 77 67    Filed Weights   11/27/23 1529  Weight: 83.7 kg    11. T2DM: A1c 5.6    - Blood sugars well-controlled on current regimen  -8/29 CBGs stable continue to monitor  Recent Labs    11/29/23 2104 11/30/23 0603 11/30/23 1204  GLUCAP 121* 106* 114*     12. Post  op PAF after CABG             -HR now controlled, regular             -metoprolol  xl 25mg  daily,  13. CKD  IIIb. Cr has been stable around  1.8   - Creatinine 1.77, stable.  Monitor . - 8/28: BUN/creatinine stable.  14. Hx of prostate cancer/hematuria 15. Hx hypothyroid--levothyroxine   16. Hx of interstitial lung disease/allergies/OSA   - Sleep study performed downstairs, started on CPAP             -albuterol  prn             -flonase  and azelastine  nasal sprays bid   - Add petroleum jelly to nares as needed before placing nasal pillow--ordered standing 8-28  - 8-28: Patient reports that final sleep study stated that he does not need CPAP.  Bedside educational manual seems to just be general information on stroke, but patient is insistent that he cannot tolerate it and will not use it for now on.  17. Constipation. Home dulcolax 15 mg daily ordered per patient request.   - 8/29 LBM today stable LOS: 3 days A FACE TO FACE EVALUATION WAS PERFORMED  Murray Collier 11/30/2023, 4:27 PM

## 2023-11-30 NOTE — Progress Notes (Signed)
 Physical Therapy Session Note  Patient Details  Name: Daniel Herrera MRN: [id number] Date of Birth: November 25, 1940  Today's Date: 11/30/2023 PT Individual Time: [phone] 1133-1201 PT Individual Time Calculation (min): 55 min + 28 min   Short Term Goals: Week 1:  PT Short Term Goal 1 (Week 1): Pt will complete transfers with CGA consistently with LRAD PT Short Term Goal 2 (Week 1): Pt will complete gait iwth LRAD with CGA 150' PT Short Term Goal 3 (Week 1): Pt will complete stair negotiation with CGA and min verbal cues for LLE awareness  Skilled Therapeutic Interventions/Progress Updates:    SESSION 1: Pt presents in room in [facility] with nursing staff present, pt denies pain. Pt agreeable to PT but requires increased time to motivate participation outside of the room as pt highly distractible. Pt continues to report tightness and soreness in L shoulder. Session focused on NMR for BUE/BLE coordination, dynamic standing balance, and midline orientation as well as therapeutic activities to provide education on affects of CVA on mobility. Pt completes WC mobility with BLEs to day room with min assist to initiate and maintain momentum, completed as NMR to promote BLE muscle fiber recruitment and coordination. Pt completes squat pivot transfer to nustep with min assist and mod cues for safety with pt demonstrating impulsivity, provided with knowledge of results cues for safety precautions as pt moves impulsively prior to therapist giving instructions. Pt then completes continuous training on nustep BUE/BLE pace controlled at 60 SPM on Level 4 for 10 minutes with min cues for midline orientation as pt demonstrating L lateral shift during activity, completed to promote BUE/BLE coordination. Pt then completes stand step transfer with cues for upright posture no UE support as well as slow controlled movements with pt able to complete without device with CGA/light min assist. Pt completes same transfer to EOM. Pt  then completes NMR for BLE coordination, midline orientation and weightshifting including: - side stepping along EOM no UE support 3x6' with cues for upright posture - marching alternating BLE x10, LLE only 2x10 - step taps 4 step with mirror x20 alternating marches, x10 LLE only - lateral step taps LLE x10 with MAX cues and assist for R lateral weight shift with pt significant difficulty shifting weight to R side Pt returns to room and remains seated in [facility] with all needs within reach, cal light in place and chair alarm donned and activated at end of session.   SESSION 2: Pt presents in room in bed, agreeable to PT. Pt reporting tightness in L shoulder. Session focused on NMR for dynamic standing balance midline orientation and weight shift to RLE for BLE coordinatoin. Pt completes bed mobility with superivison and transfers with CGA/light min assist throughout session. Pt transported via WC to hallway outside of main gym. Pt completes NMR ambulating forward/backward with RUE on HR and cues to lean R shoulder into wall, completes x8 trials with mirror positioned in front, cues for BLE foot placement, requires min occasional mod assist, provided with 2.5# ankle weights on BLEs for last 3 trials. Pt then ambulates with [facility] on R side 2x10' with mod cues for sequencing,  min assist with pt demonstrating improved midline orientation and BLE coordination. Pt returns to room and remains seated in [facility] with all needs within reach, cal light in place and chair alarm donned and activated at end of session.    Therapy Documentation Precautions:  Precautions Precautions: Fall Recall of Precautions/Restrictions: Impaired Restrictions Weight Bearing Restrictions Per Provider  Order: No   Therapy/Group: Individual Therapy  Reche Ohara PT, DPT 11/30/2023, 12:53 PM

## 2023-11-30 NOTE — Progress Notes (Signed)
 Occupational Therapy Session Note  Patient Details  Name: HARLON KUTNER MRN: [id number] Date of Birth: 1940/07/20  Today's Date: 11/30/2023 OT Individual Time: 1005-1052 OT Individual Time Calculation (min): 47 min   Today's Date: 11/30/2023 OT Individual Time: 1345-1435 OT Individual Time Calculation (min): 50 min   Short Term Goals: Week 1:  OT Short Term Goal 1 (Week 1): STGs=LTGs due to patient's estimated length of stay.  Skilled Therapeutic Interventions/Progress Updates:   Session 1: Pt greeted attempting void for skilled OT session with focus on toileting, functional transfers, and bathing/dressing within shower context.   Pain: Pt with no reports of pain, OT offering intermediate rest breaks and positioning suggestions throughout session to address pain/fatigue and maximize participation/safety in session.   Functional Transfers: Sit<>stands during session with CGA-Min A + RW, ambulatory bathroom transfers with Min A + RW, verbal cuing provided for attention to LLE position and safe RW proximity.   Self Care Tasks: Pt completes the following self care tasks with levels of assistance noted below, Extended time required for BM void. Pericare with setup and lateral leans.  Bathing/dressing with CGA for standing care with intermediate unilateral support. Min A provided for buttoning UB garment. Footwear management with setup.    Education: Patient educated on effects of moving fast on LUE ataxia as he becomes frustrated with decreased functioning during session, but is also observed to be rushing through ADL tasks. Discussed OT POC.   Pt remained resting in bed with 4Ps assessed and immediate needs met. Pt continues to be appropriate for skilled OT intervention to promote further functional independence in ADLs/IADLs.   Session 2: Pt greeted sitting in [facility] for skilled OT session with focus on LUE NMR.   Pain: Pt with no reports of pain, but continued reports of LUE  stiffness/swelling. OT offering intermediate rest breaks and positioning suggestions throughout session to address pain/fatigue and maximize participation/safety in session. Moist heat provided to L-shoulder in preparation for ROM/therex. Sensation WFL.   Functional Transfers: Pt dependently transported from room<>main therapy gym for time/energy conservation.   Therapeutic Activities/Exercises: Pt instructed in the following exercises to target LUE ROM/conditioning, details below: Shoulder flexion/extension Shoulder abduction/adduction Forward/backward circumduction   Pt completes 3x10 reps with 2# dowel bar, multimodal cuing provided correct form, mirror provided for visual feedback.   Pt then performs 2x15 reps of diagonal reaches/retrival of items following PNF patterns for LUE ROM/coordination. Cuing provided for pacing of activity to manage ataxic impact.   Education: Patient educated on basic CVA/stroke symptomology/recovery.   Pt remained in direct care of NT with 4Ps assessed and immediate needs met. Pt continues to be appropriate for skilled OT intervention to promote further functional independence in ADLs/IADLs.   Therapy Documentation Precautions:  Precautions Precautions: Fall Recall of Precautions/Restrictions: Impaired Restrictions Weight Bearing Restrictions Per Provider Order: No   Therapy/Group: Individual Therapy  Nereida Habermann, OTR/L, MSOT  11/30/2023, 6:21 AM

## 2023-12-01 DIAGNOSIS — I63331 Cerebral infarction due to thrombosis of right posterior cerebral artery: Secondary | ICD-10-CM

## 2023-12-01 LAB — GLUCOSE, CAPILLARY
Glucose-Capillary: 196 mg/dL — ABNORMAL HIGH (ref 70–99)
Glucose-Capillary: 163 mg/dL — ABNORMAL HIGH (ref 70–99)
Glucose-Capillary: 175 mg/dL — ABNORMAL HIGH (ref 70–99)
Glucose-Capillary: 106 mg/dL — ABNORMAL HIGH (ref 70–99)

## 2023-12-01 NOTE — Progress Notes (Signed)
 PROGRESS NOTE   Subjective/Complaints:  No issues, appreciate neuro note  ROS: Denies fevers, chills, N/V, abdominal pain, constipation, SOB, diarrhea, cough, chest pain, new weakness or paraesthesias.     Objective:   No results found.  Recent Labs    11/29/23 0504  WBC 5.7  HGB 12.7*  HCT 36.2*  PLT 165   Recent Labs    11/29/23 0504  NA 138  K 3.9  CL 106  CO2 21*  GLUCOSE 106*  BUN 30*  CREATININE 1.78*  CALCIUM  9.1    Intake/Output Summary (Last 24 hours) at 12/01/2023 1330 Last data filed at 12/01/2023 0737 Gross per 24 hour  Intake 236 ml  Output 450 ml  Net -214 ml        Physical Exam: Vital Signs Blood pressure 131/75, pulse 68, temperature 97.7 F (36.5 C), resp. rate 18, height 6' 1 (1.854 m), weight 83.7 kg, SpO2 98%.  General: No acute distress Mood and affect are appropriate Heart: Regular rate and rhythm no rubs murmurs or extra sounds Lungs: Clear to auscultation, breathing unlabored, no rales or wheezes Abdomen: Positive bowel sounds, soft nontender to palpation, nondistended Extremities: No clubbing, cyanosis, or edema Skin: No evidence of breakdown, no evidence of rash Neurologic: Cranial nerves II through XII intact, motor strength is 5/5 in bilateral deltoid, bicep, tricep, grip, hip flexor, knee extensors, ankle dorsiflexor and plantar flexor Sensory exam normal sensation to light touch and proprioception in bilateral upper and lower extremities Cerebellar exam normal finger to nose to finger as well as heel to shin in bilateral upper and lower extremities Musculoskeletal: Full range of motion in all 4 extremities. No joint swelling    MSK:      No apparent deformity.      Assessment/Plan: 1. Functional deficits which require 3+ hours per day of interdisciplinary therapy in a comprehensive inpatient rehab setting. Physiatrist is providing close team supervision and 24  hour management of active medical problems listed below. Physiatrist and rehab team continue to assess barriers to discharge/monitor patient progress toward functional and medical goals  Care Tool:  Bathing    Body parts bathed by patient: Right arm, Chest, Left arm, Abdomen, Front perineal area, Buttocks, Right upper leg, Left upper leg, Right lower leg, Left lower leg, Face         Bathing assist Assist Level: Minimal Assistance - Patient > 75% (For balance)     Upper Body Dressing/Undressing Upper body dressing   What is the patient wearing?: Pull over shirt    Upper body assist Assist Level: Set up assist    Lower Body Dressing/Undressing Lower body dressing      What is the patient wearing?: Pants, Underwear/pull up     Lower body assist Assist for lower body dressing: Minimal Assistance - Patient > 75%     Toileting Toileting    Toileting assist Assist for toileting: Minimal Assistance - Patient > 75%     Transfers Chair/bed transfer  Transfers assist     Chair/bed transfer assist level: Minimal Assistance - Patient > 75%     Locomotion Ambulation   Ambulation assist      Assist level:  Moderate Assistance - Patient 50 - 74% Assistive device: Walker-rolling Max distance: 60'   Walk 10 feet activity   Assist     Assist level: Minimal Assistance - Patient > 75% Assistive device: Walker-rolling   Walk 50 feet activity   Assist    Assist level: Moderate Assistance - Patient - 50 - 74% Assistive device: Walker-rolling    Walk 150 feet activity   Assist Walk 150 feet activity did not occur: Safety/medical concerns         Walk 10 feet on uneven surface  activity   Assist Walk 10 feet on uneven surfaces activity did not occur: Safety/medical concerns         Wheelchair     Assist Is the patient using a wheelchair?: Yes Type of Wheelchair: Manual    Wheelchair assist level: Supervision/Verbal cueing Max wheelchair  distance: 200'    Wheelchair 50 feet with 2 turns activity    Assist        Assist Level: Supervision/Verbal cueing   Wheelchair 150 feet activity     Assist      Assist Level: Supervision/Verbal cueing   Blood pressure 131/75, pulse 68, temperature 97.7 F (36.5 C), resp. rate 18, height 6' 1 (1.854 m), weight 83.7 kg, SpO2 98%.   Medical Problem List and Plan: 1. Functional deficits secondary to right thalamic infarct, small SAH post CABG             -he has made nice neurological gains, but still with sensory/propioceptive deficits and ataxia.              -patient may shower             -ELOS/Goals: 5-7 days, mod I to supervision   - Stable to continue inpatient rehab  - 8/28: Reports of increased numbness/tingling through his left arm and face late this morning after evaluation; repeat CT head unchanged.  Neurology consult appreciated , no new infarct  Symptoms c/w R thalamic infarct previously diagnosed  2.  Antithrombotics: -DVT/anticoagulation:  Mechanical: Sequential compression devices, below knee Bilateral lower extremities             -antiplatelet therapy: Aspirin  81 mg (hx of bleeding on plavix )   3. Pain Management: Arthritis bilat hands and ankles: request home med nabumetone  750 mg---will resume   - No complaints of pain 8-27  4. Mood/Behavior/Sleep: LCSW to follow for evaluation and support when available.              -antipsychotic agents:  Does not want Buspar  makes me feel horrible --discontinue as needed.  Denies need of anything for anxiety.             -Insomnia: Requesting home Temazepam  15 mg for insomnia-ordered --using as needed, with significant benefit             5. Neuropsych/cognition: This patient is capable of making decisions on his own behalf. 6. Skin/Wound Care: routine pressure relief measures  7. Fluids/Electrolytes/Nutrition: monitor I&O and follow up chemistries              -Heart healthy diet   8. Hereditary  angioedema: Patient supplied med (icatibant ) is in pharmacy fridge-patient had 1 episode of angioedema on 8/25 which was successfully treated with icatibant  PRN.              -uses takhzyro  inj sq every 2 weeks at home   - 8/28: Family brought Takhzyro  form home - reviewed med data sheet,  no contraindications with stroke or interactions with current meds - ordered for every other Friday (pharmacy will need to confirm he did not get it day of admission 8/22)  9. CAD s/p stenting HLD: LDL 106 on Pitavastatin  4 mg daily and Zetia               -Start Leqvio as outpatient therapy             -see meds below for PAF/HTN/CHF             -nitro prn CP  10. HTN/CHF: norvasc  and losartan               -bp goal of less than 180/105   - Blood pressure well-controlled  8-28: Blood pressure soft overnight and this a.m.; will get orthostatic vitals.  Will get daily weights for CHF.  8/29 BP stable continue current regimen and monitor/follow asked nursing to check weights, no signs of overload    12/01/2023    8:55 AM 12/01/2023    6:23 AM 11/30/2023    7:48 PM  Vitals with BMI  Systolic 131 157 868  Diastolic 75 82 67  Pulse 68 77 78    Filed Weights   11/27/23 1529  Weight: 83.7 kg    11. T2DM: A1c 5.6    - Blood sugars well-controlled on current regimen  -8/29 CBGs stable continue to monitor  Recent Labs    11/30/23 2048 12/01/23 0726 12/01/23 1153  GLUCAP 164* 196* 163*     12. Post op PAF after CABG             -HR now controlled, regular             -metoprolol  xl 25mg  daily,  13. CKD IIIb. Cr has been stable around  1.8   - Creatinine 1.77, stable.  Monitor . - 8/28: BUN/creatinine stable.  14. Hx of prostate cancer/hematuria 15. Hx hypothyroid--levothyroxine   16. Hx of interstitial lung disease/allergies/OSA   - Sleep study performed downstairs, started on CPAP             -albuterol  prn             -flonase  and azelastine  nasal sprays bid   - Add petroleum jelly to nares as  needed before placing nasal pillow--ordered standing 8-28  - 8-28: Patient reports that final sleep study stated that he does not need CPAP.  Bedside educational manual seems to just be general information on stroke, but patient is insistent that he cannot tolerate it and will not use it for now on.  17. Constipation. Home dulcolax 15 mg daily ordered per patient request.   - 8/29 LBM today stable LOS: 4 days A FACE TO FACE EVALUATION WAS PERFORMED  Daniel Herrera 12/01/2023, 1:30 PM

## 2023-12-01 NOTE — Progress Notes (Signed)
 Physical Therapy Session Note  Patient Details  Name: Daniel Herrera MRN: [id number] Date of Birth: 05-26-1940  Today's Date: 12/01/2023 PT Individual Time: 1005-1115 PT Individual Time Calculation (min): 70 min   Short Term Goals: Week 1:  PT Short Term Goal 1 (Week 1): Pt will complete transfers with CGA consistently with LRAD PT Short Term Goal 2 (Week 1): Pt will complete gait iwth LRAD with CGA 150' PT Short Term Goal 3 (Week 1): Pt will complete stair negotiation with CGA and min verbal cues for LLE awareness  Skilled Therapeutic Interventions/Progress Updates: Patient sitting in WC on entrance to room. Patient alert and agreeable to PT session.   Patient reported unrated pain in L UE (ongoing/chronic). Manual therapy and therex performed to assist. Pt reported pain is still present due to past injury, but that tension has decreased and felt looser.   Therapeutic Activity: Transfers: Pt performed sit<>stand transfers throughout session in preparation for functional mobility or to sit on mat/WC with CGA/light minA. Provided VC for hand placement and safe proximity of RW.  Gait Training:  Pt ambulated roughly 20' using RW with min/heavy minA. Pt demonstrated the following gait deviations with therapist providing the described cuing and facilitation for improvement:  - Decreased heel strike and knee flexion L LE (dorsiflexion with theraband and 4 heel strike step NMRE intervention performed following)  Pt ambulated 78' x 1 and 30' x 1 following NMRE in RW with minA and improved heel strike pattern/step clearance. Pt also cued to increase step length on R LE, and to avoid too long of a step. Pt   Neuromuscular Re-ed: - LAQ L LE 5lb ankle weight donned with emphasis on control and 3 second hold in extension - Seated hip flexion L LE 5lb ankle weight donned with VC to avoid touching PTA had on anterior/posterior aspect per decreased control of maintaining static lower leg (would  move into flexion during hip extension). 3 x till failure with rest breaks required. Pt with decreased AROM hip flexion when having to coordinate movement without flexing knee. - L dorsiflexion with red theraband (progressed from yellow theraband) with leg propped up on chair. Pt cued to control full eccentric ROM and to pause briefly at full available dorsiflexion ROM. 3 sets till fatigue - 4 step to with L LE and B UE support and 5lb ankle weight donned. Pt cued to strike step with heel only, and to step back and touch floor with front of shoe. Pt educated on rationale as this translates over to swing phase of gait cycle. 3 rounds performed with rest break required. Pt with CGA throughout.   NMR performed for improvements in motor control and coordination, balance, sequencing, judgement, and self confidence/ efficacy in performing all aspects of mobility at highest level of independence.   Therapeutic Exercise: Pt performed the following exercises with therapist providing the described cuing and facilitation for improvement. - B levator scapula stretch following manual therapy 3 x 15 second holds with cues for mechanics.   Manual Therapy: Palpation of L upper trap musculature performed with trigger points noted. Education and rationale provided with pt agreeing to participate in intervention. - Trigger point release to stated area with soft tissue mobilization to follow throughout. Increased time required to adjust to pt's tolerance to trigger point release- hot pack donned following while pt performed seated NMRE  Patient sitting in WC at end of session with brakes locked, belt alarm set, and all needs within reach.  Therapy Documentation Precautions:  Precautions Precautions: Fall Recall of Precautions/Restrictions: Impaired Restrictions Weight Bearing Restrictions Per Provider Order: No   Therapy/Group: Individual Therapy  Kikue Gerhart PTA 12/01/2023, 12:07 PM

## 2023-12-01 NOTE — Plan of Care (Signed)
  Problem: Consults Goal: RH STROKE PATIENT EDUCATION Description: See Patient Education module for education specifics  Outcome: Progressing   Problem: RH BLADDER ELIMINATION Goal: RH STG MANAGE BLADDER WITH ASSISTANCE Description: STG Manage Bladder With mod I  Assistance Outcome: Progressing   Problem: RH SAFETY Goal: RH STG ADHERE TO SAFETY PRECAUTIONS W/ASSISTANCE/DEVICE Description: STG Adhere to Safety Precautions With cues Assistance/Device. Outcome: Progressing   Problem: RH KNOWLEDGE DEFICIT Goal: RH STG INCREASE KNOWLEDGE OF DIABETES Description: Patient and fspouse will be able to manage dm using educational resources for medications and dietary modification independently Outcome: Progressing Goal: RH STG INCREASE KNOWLEDGE OF HYPERTENSION Description: Patient and spouse will be able to manage HTN using educational resources for medications and dietary modification independently Outcome: Progressing Goal: RH STG INCREASE KNOWLEGDE OF HYPERLIPIDEMIA Description: Patient and spouse will be able to manage HLD using educational resources for medications and dietary modification independently Outcome: Progressing Goal: RH STG INCREASE KNOWLEDGE OF STROKE PROPHYLAXIS Description: Patient and spouse will be able to manage secondary risks using educational resources for medications and dietary modification independently Outcome: Progressing

## 2023-12-01 NOTE — Progress Notes (Signed)
 Occupational Therapy Session Note  Patient Details  Name: Daniel Herrera MRN: [id number] Date of Birth: Nov 20, 1940  Today's Date: 12/01/2023 OT Individual Time: 0705-0815 OT Individual Time Calculation (min): 70 min   Today's Date: 12/01/2023 OT Individual Time: 1310-1350 OT Individual Time Calculation (min): 40 min   Short Term Goals: Week 1:  OT Short Term Goal 1 (Week 1): STGs=LTGs due to patient's estimated length of stay.  Skilled Therapeutic Interventions/Progress Updates:   Session 1: Pt greeted sitting in WC, finishing breakfast, for skilled OT session with focus on functional mobility and LUE/LLE NMR.   Pain: Pt with no reports of pain, although increased sensation of SOB, O2 stats stable with extended rest-breaks.   Functional Transfers: WC propulsion from room<>day room with supervision, cuing for attention to LUE.   Self Care Tasks: No self care needs reported this session.   Therapeutic Activities: Pt instructed in functional mobility activity targeting object avoidance and safe RW management. Pt weaves in/out of cones with Mod multimodal cuing to correct decreased LLE step-height, CGA-Min A for mobility.   Pt completes 2x10 blocked sit<>stands with RLE elevated on 3-in step for improved WB/weight-shift onto LLE. Min A provided for balance with cuing for slowed pacing to improve control, including descent.   Static stance activity performed targeting standing balance without UE support while retrieving squigz from mirror. Pt unable to progress activity towards taking a step forward to retrieve squigz, activity modified by having patient step to tap object with left hand and then step back to starting position. Mod A provided with cuing for attention to LLE for continued muscular activation.   Pt participates in FMC/dexterity activity, retrieving/transporting/placing small playing balls into netted surfaces. Improvement noted in ataxic movements/coordination.   Pt  remained sitting in WC with 4Ps assessed and immediate needs met. Pt continues to be appropriate for skilled OT intervention to promote further functional independence in ADLs/IADLs.   Session 2: Pt greeted sitting in [facility] for skilled OT session with focus on LUE NMR.   Pain: Pt with no reports of pain. OT offering intermediate rest breaks and positioning suggestions throughout session to address pain/fatigue and maximize participation/safety in session.   Functional Transfers: Dependent for WC transport to day room for time management.   Therapeutic Activities: Pt completes 2x10 reps of hoop arc for LUE ROM, 0.5# wrist weight added for improved proprioceptive input. Pt then instructed in series coordination activities using multiple items/objects including small geometric shapes and Jenga playing pieces. Pt with improved coordination of LUE, able to progress away from needing heavy wrist weights. Pt even able to pour ice into/out of cups with BUE with minimal spillage.   Pt remained sitting in WC with 4Ps assessed and immediate needs met. Pt continues to be appropriate for skilled OT intervention to promote further functional independence in ADLs/IADLs.   Therapy Documentation Precautions:  Precautions Precautions: Fall Recall of Precautions/Restrictions: Impaired Restrictions Weight Bearing Restrictions Per Provider Order: No   Therapy/Group: Individual Therapy  Nereida Habermann, OTR/L, MSOT  12/01/2023, 5:13 AM

## 2023-12-02 LAB — GLUCOSE, CAPILLARY
Glucose-Capillary: 107 mg/dL — ABNORMAL HIGH (ref 70–99)
Glucose-Capillary: 173 mg/dL — ABNORMAL HIGH (ref 70–99)
Glucose-Capillary: 162 mg/dL — ABNORMAL HIGH (ref 70–99)
Glucose-Capillary: 153 mg/dL — ABNORMAL HIGH (ref 70–99)
Glucose-Capillary: 133 mg/dL — ABNORMAL HIGH (ref 70–99)

## 2023-12-02 NOTE — Progress Notes (Signed)
 Occupational Therapy Session Note  Patient Details  Name: KYNG MATLOCK MRN: [id number] Date of Birth: 1940-11-28  {CHL IP REHAB OT TIME CALCULATIONS:304400400}  {CHL IP REHAB OT TIME CALCULATIONS:304400400}  Short Term Goals: Week 1:  OT Short Term Goal 1 (Week 1): STGs=LTGs due to patient's estimated length of stay.  Skilled Therapeutic Interventions/Progress Updates:   Session 1: Pt greeted *** for skilled OT session with focus on ***.   Pain: Pt reported ***/10 pain, stating *** in reference to ***. OT offering intermediate rest breaks and positioning suggestions throughout session to address pain/fatigue and maximize participation/safety in session.   Functional Transfers:  Self Care Tasks: Pt completes the following self care tasks with levels of assistance noted below, UB: LB:   Therapeutic Activities:  Therapeutic Exercise:   Education:  Pt remained *** with 4Ps assessed and immediate needs met. Pt continues to be appropriate for skilled OT intervention to promote further functional independence in ADLs/IADLs.    Session 2: Pt greeted *** for skilled OT session with focus on ***.   Pain: Pt reported ***/10 pain, stating *** in reference to ***. OT offering intermediate rest breaks and positioning suggestions throughout session to address pain/fatigue and maximize participation/safety in session.   Functional Transfers:  Self Care Tasks: Pt completes the following self care tasks with levels of assistance noted below, UB: LB:   Therapeutic Activities:  Therapeutic Exercise:   Education:  Pt remained *** with 4Ps assessed and immediate needs met. Pt continues to be appropriate for skilled OT intervention to promote further functional independence in ADLs/IADLs.    Therapy Documentation Precautions:  Precautions Precautions: Fall Recall of Precautions/Restrictions: Impaired Restrictions Weight Bearing Restrictions Per Provider Order:  No   Therapy/Group: Individual Therapy  Nereida Habermann, OTR/L, MSOT  12/02/2023, 10:09 PM

## 2023-12-02 NOTE — Progress Notes (Addendum)
 PROGRESS NOTE   Subjective/Complaints:  No issues, starting to use LUE for fxnl activities, can screw on bottle cap using LUE  ROS: Denies fevers, chills, N/V, abdominal pain, constipation, SOB, diarrhea, cough, chest pain, new weakness or paraesthesias.     Objective:   No results found.  No results for input(s): WBC, HGB, HCT, PLT in the last 72 hours.  No results for input(s): NA, K, CL, CO2, GLUCOSE, BUN, CREATININE, CALCIUM  in the last 72 hours.   Intake/Output Summary (Last 24 hours) at 12/02/2023 1151 Last data filed at 12/02/2023 0443 Gross per 24 hour  Intake 476 ml  Output 1300 ml  Net -824 ml        Physical Exam: Vital Signs Blood pressure 134/76, pulse 81, temperature 98.5 F (36.9 C), resp. rate 18, height 6' 1 (1.854 m), weight 83.7 kg, SpO2 97%.  General: No acute distress Mood and affect are appropriate Heart: Regular rate and rhythm no rubs murmurs or extra sounds Lungs: Clear to auscultation, breathing unlabored, no rales or wheezes Abdomen: Positive bowel sounds, soft nontender to palpation, nondistended Extremities: No clubbing, cyanosis, or edema Skin: No evidence of breakdown, no evidence of rash Neurologic: Cranial nerves II through XII intact, motor strength is 5/5 in right and 4/5 left deltoid, bicep, tricep, grip, hip flexor, knee extensors, ankle dorsiflexor and plantar flexor Sensory exam normal sensation to light touch and proprioception in bilateral upper and lower extremities Cerebellar exam mild ataxia L FNF  Musculoskeletal: Full range of motion in all 4 extremities. No joint swelling      Assessment/Plan: 1. Functional deficits which require 3+ hours per day of interdisciplinary therapy in a comprehensive inpatient rehab setting. Physiatrist is providing close team supervision and 24 hour management of active medical problems listed below. Physiatrist  and rehab team continue to assess barriers to discharge/monitor patient progress toward functional and medical goals  Care Tool:  Bathing    Body parts bathed by patient: Right arm, Chest, Left arm, Abdomen, Front perineal area, Buttocks, Right upper leg, Left upper leg, Right lower leg, Left lower leg, Face         Bathing assist Assist Level: Minimal Assistance - Patient > 75% (For balance)     Upper Body Dressing/Undressing Upper body dressing   What is the patient wearing?: Pull over shirt    Upper body assist Assist Level: Set up assist    Lower Body Dressing/Undressing Lower body dressing      What is the patient wearing?: Pants, Underwear/pull up     Lower body assist Assist for lower body dressing: Minimal Assistance - Patient > 75%     Toileting Toileting    Toileting assist Assist for toileting: Minimal Assistance - Patient > 75%     Transfers Chair/bed transfer  Transfers assist     Chair/bed transfer assist level: Minimal Assistance - Patient > 75%     Locomotion Ambulation   Ambulation assist      Assist level: Moderate Assistance - Patient 50 - 74% Assistive device: Walker-rolling Max distance: 60'   Walk 10 feet activity   Assist     Assist level: Minimal Assistance - Patient >  75% Assistive device: Walker-rolling   Walk 50 feet activity   Assist    Assist level: Moderate Assistance - Patient - 50 - 74% Assistive device: Walker-rolling    Walk 150 feet activity   Assist Walk 150 feet activity did not occur: Safety/medical concerns         Walk 10 feet on uneven surface  activity   Assist Walk 10 feet on uneven surfaces activity did not occur: Safety/medical concerns         Wheelchair     Assist Is the patient using a wheelchair?: Yes Type of Wheelchair: Manual    Wheelchair assist level: Supervision/Verbal cueing Max wheelchair distance: 200'    Wheelchair 50 feet with 2 turns  activity    Assist        Assist Level: Supervision/Verbal cueing   Wheelchair 150 feet activity     Assist      Assist Level: Supervision/Verbal cueing   Blood pressure 134/76, pulse 81, temperature 98.5 F (36.9 C), resp. rate 18, height 6' 1 (1.854 m), weight 83.7 kg, SpO2 97%.   Medical Problem List and Plan: 1. Functional deficits secondary to right thalamic infarct, small SAH post CABG             -he has made nice neurological gains, but still with sensory/propioceptive deficits and ataxia.              -patient may shower             -ELOS/Goals: 5-7 days, mod I to supervision   - Stable to continue inpatient rehab  - 8/28: Reports of increased numbness/tingling through his left arm and face late this morning after evaluation; repeat CT head unchanged.  Neurology consult appreciated , no new infarct  Symptoms c/w R thalamic infarct previously diagnosed  2.  Antithrombotics: -DVT/anticoagulation:  Mechanical: Sequential compression devices, below knee Bilateral lower extremities             -antiplatelet therapy: Aspirin  81 mg (hx of bleeding on plavix )   3. Pain Management: Arthritis bilat hands and ankles: request home med nabumetone  750 mg---will resume   - No complaints of pain 8-27  4. Mood/Behavior/Sleep: LCSW to follow for evaluation and support when available.              -antipsychotic agents:  Does not want Buspar  makes me feel horrible --discontinue as needed.  Denies need of anything for anxiety.             -Insomnia: Requesting home Temazepam  15 mg for insomnia-ordered --using as needed, with significant benefit             5. Neuropsych/cognition: This patient is capable of making decisions on his own behalf. 6. Skin/Wound Care: routine pressure relief measures  7. Fluids/Electrolytes/Nutrition: monitor I&O and follow up chemistries              -Heart healthy diet   8. Hereditary angioedema: Patient supplied med (icatibant ) is in pharmacy  fridge-patient had 1 episode of angioedema on 8/25 which was successfully treated with icatibant  PRN.              -uses takhzyro  inj sq every 2 weeks at home   - 8/28: Family brought Takhzyro  form home - reviewed med data sheet, no contraindications with stroke or interactions with current meds - ordered for every other Friday (pharmacy will need to confirm he did not get it day of admission 8/22)  9. CAD s/p  stenting HLD: LDL 106 on Pitavastatin  4 mg daily and Zetia               -Start Leqvio as outpatient therapy             -see meds below for PAF/HTN/CHF             -nitro prn CP  10. HTN/CHF: norvasc  and losartan               -bp goal of less than 180/105   - Blood pressure well-controlled  8-28: Blood pressure soft overnight and this a.m.; will get orthostatic vitals.  Will get daily weights for CHF.  8/29 BP stable continue current regimen and monitor/follow asked nursing to check weights, no signs of overload    12/02/2023    8:18 AM 12/02/2023    4:41 AM 12/01/2023    7:51 PM  Vitals with BMI  Systolic 134 156 872  Diastolic 76 76 69  Pulse 81 69 70    Filed Weights   11/27/23 1529  Weight: 83.7 kg    11. T2DM: A1c 5.6    - Blood sugars well-controlled on current regimen  -8/29 CBGs stable continue to monitor  Recent Labs    12/01/23 1153 12/01/23 1641 12/01/23 2145  GLUCAP 163* 175* 106*     12. Post op PAF after CABG             -HR now controlled, regular             -metoprolol  xl 25mg  daily,  13. CKD IIIb. Cr has been stable around  1.8   - Creatinine 1.77, stable.  Monitor . - 8/28: BUN/creatinine stable.  14. Hx of prostate cancer/hematuria 15. Hx hypothyroid--levothyroxine   16. Hx of interstitial lung disease/allergies/OSA   - Sleep study performed downstairs, started on CPAP             -albuterol  prn             -flonase  and azelastine  nasal sprays bid   - Add petroleum jelly to nares as needed before placing nasal pillow--ordered standing 8-28  -  8-28: Patient reports that final sleep study stated that he does not need CPAP.  Bedside educational manual seems to just be general information on stroke, but patient is insistent that he cannot tolerate it and will not use it for now on.  17. Constipation. Home dulcolax 15 mg daily ordered per patient request.   - 8/29 LBM today stable LOS: 5 days A FACE TO FACE EVALUATION WAS PERFORMED  Daniel Herrera 12/02/2023, 11:51 AM

## 2023-12-02 NOTE — Plan of Care (Signed)
  Problem: Consults Goal: RH STROKE PATIENT EDUCATION Description: See Patient Education module for education specifics  Outcome: Progressing   Problem: RH BLADDER ELIMINATION Goal: RH STG MANAGE BLADDER WITH ASSISTANCE Description: STG Manage Bladder With mod I  Assistance Outcome: Progressing   Problem: RH SAFETY Goal: RH STG ADHERE TO SAFETY PRECAUTIONS W/ASSISTANCE/DEVICE Description: STG Adhere to Safety Precautions With cues Assistance/Device. Outcome: Progressing

## 2023-12-03 DIAGNOSIS — J849 Interstitial pulmonary disease, unspecified: Secondary | ICD-10-CM

## 2023-12-03 LAB — BASIC METABOLIC PANEL WITH GFR
Anion gap: 10 (ref 5–15)
BUN: 36 mg/dL — ABNORMAL HIGH (ref 8–23)
CO2: 19 mmol/L — ABNORMAL LOW (ref 22–32)
Calcium: 9.1 mg/dL (ref 8.9–10.3)
Chloride: 107 mmol/L (ref 98–111)
Creatinine, Ser: 1.97 mg/dL — ABNORMAL HIGH (ref 0.61–1.24)
GFR, Estimated: 33 mL/min — ABNORMAL LOW (ref >60)
Glucose, Bld: 106 mg/dL — ABNORMAL HIGH (ref 70–99)
Potassium: 4 mmol/L (ref 3.5–5.1)
Sodium: 136 mmol/L (ref 135–145)

## 2023-12-03 LAB — GLUCOSE, CAPILLARY
Glucose-Capillary: 121 mg/dL — ABNORMAL HIGH (ref 70–99)
Glucose-Capillary: 114 mg/dL — ABNORMAL HIGH (ref 70–99)
Glucose-Capillary: 132 mg/dL — ABNORMAL HIGH (ref 70–99)
Glucose-Capillary: 124 mg/dL — ABNORMAL HIGH (ref 70–99)

## 2023-12-03 LAB — CBC
HCT: 35.1 % — ABNORMAL LOW (ref 39.0–52.0)
Hemoglobin: 12.4 g/dL — ABNORMAL LOW (ref 13.0–17.0)
MCH: 32.3 pg (ref 26.0–34.0)
MCHC: 35.3 g/dL (ref 30.0–36.0)
MCV: 91.4 fL (ref 80.0–100.0)
Platelets: 175 K/uL (ref 150–400)
RBC: 3.84 MIL/uL — ABNORMAL LOW (ref 4.22–5.81)
RDW: 12.4 % (ref 11.5–15.5)
WBC: 6.1 K/uL (ref 4.0–10.5)
nRBC: 0 % (ref 0.0–0.2)

## 2023-12-03 MED ORDER — SORBITOL 70 % SOLN: 30.0000 mL | Freq: Every day | Status: DC | PRN

## 2023-12-03 MED ORDER — POLYETHYLENE GLYCOL 3350 17 G PO PACK: 17.0000 g | PACK | Freq: Every day | ORAL | Status: DC

## 2023-12-03 MED ORDER — SENNOSIDES-DOCUSATE SODIUM 8.6-50 MG PO TABS: 1.0000 | ORAL_TABLET | Freq: Every day | ORAL | Status: DC

## 2023-12-03 MED ORDER — POLYETHYLENE GLYCOL 3350 17 G PO PACK
17.0000 g | PACK | Freq: Every day | ORAL | Status: DC
  Administered 2023-12-06 – 2023-12-07 (×2): 17 g via ORAL
  Filled 2023-12-03 (×8): qty 1

## 2023-12-03 NOTE — Progress Notes (Signed)
 Refused scheduled Miralax . Made aware of reasoning and importance of medication. Explained to patient outside of this afternoon, prior did not have a bowel movement for approximately four days. Per patient expressed medication gives him loose stools and doesn't agree with me. Requesting only the Dulcolax tablets for constipation when needed.

## 2023-12-03 NOTE — Progress Notes (Signed)
 Physical Therapy Session Note  Patient Details  Name: Daniel Herrera MRN: [id number] Date of Birth: Jul 28, 1940  Today's Date: 12/03/2023 PT Individual Time: 8547-8467 PT Individual Time Calculation (min): 40 min   Short Term Goals: Week 1:  PT Short Term Goal 1 (Week 1): Pt will complete transfers with CGA consistently with LRAD PT Short Term Goal 2 (Week 1): Pt will complete gait iwth LRAD with CGA 150' PT Short Term Goal 3 (Week 1): Pt will complete stair negotiation with CGA and min verbal cues for LLE awareness  Skilled Therapeutic Interventions/Progress Updates:   Pt received upright in WC, pleasant and agreeable to therapy. Pt wheeled to main gym. Pt denied any pain throughout (t/o) session.    Transfers:   Pt performed several sit to stand/stand sit transfers t/o session, requiring RW and CGA to minA for safety. Pt also required repetitive verbal cues to push up from [facility] and not pull on RW w/ fair carryover.   Interventions:  Pt performed biodex balance exercises to improve proprioception and static standing balance. 2 min, 2 min w/ seated rest break in between. Of note, pt w/ improved balance w/o BUE support. W/ BUE support, pt unable to achieve proper balance and distributed weight bearing.   Pt performed 3x45ft + 4x28ft forward gait, 4x93ft backwards gait to improve functional activity tolerance, proprioception, and endurance. Pt required RW and CGA to minA for safety/balance, required multiple verbal cues to increase RLE step length. Pt demonstrates poor L hip IR/ER control in backwards gait, often landing in ER and RLE clips L foot on step back.   Pt self-propelled WC ~128ft w/ minA to get unstuck from other pt WC in hallway.  At end of session, pt returned to room and remained upright in WC. Call bell, cell phone and all other needs in reach.   Therapy Documentation Precautions:  Precautions Precautions: Fall Recall of Precautions/Restrictions:  Impaired Restrictions Weight Bearing Restrictions Per Provider Order: No   Therapy/Group: Individual Therapy  Oneil Grumbles 12/03/2023, 3:38 PM

## 2023-12-03 NOTE — Plan of Care (Signed)
 Downgraded to supervision level due to decreased safety awareness.   Problem: RH Balance Goal: LTG Patient will maintain dynamic standing with ADLs (OT) Description: LTG:  Patient will maintain dynamic standing balance with assist during activities of daily living (OT)  Flowsheets (Taken 12/03/2023 1605) LTG: Pt will maintain dynamic standing balance during ADLs with: Supervision/Verbal cueing   Problem: RH Bathing Goal: LTG Patient will bathe all body parts with assist levels (OT) Description: LTG: Patient will bathe all body parts with assist levels (OT) Flowsheets (Taken 12/03/2023 1605) LTG: Pt will perform bathing with assistance level/cueing: Supervision/Verbal cueing   Problem: RH Dressing Goal: LTG Patient will perform lower body dressing w/assist (OT) Description: LTG: Patient will perform lower body dressing with assist, with/without cues in positioning using equipment (OT) Flowsheets (Taken 12/03/2023 1605) LTG: Pt will perform lower body dressing with assistance level of: Supervision/Verbal cueing   Problem: RH Toileting Goal: LTG Patient will perform toileting task (3/3 steps) with assistance level (OT) Description: LTG: Patient will perform toileting task (3/3 steps) with assistance level (OT)  Flowsheets (Taken 12/03/2023 1605) LTG: Pt will perform toileting task (3/3 steps) with assistance level: Supervision/Verbal cueing   Problem: RH Simple Meal Prep Goal: LTG Patient will perform simple meal prep w/assist (OT) Description: LTG: Patient will perform simple meal prep with assistance, with/without cues (OT). Outcome: Not Applicable   Problem: RH Toilet Transfers Goal: LTG Patient will perform toilet transfers w/assist (OT) Description: LTG: Patient will perform toilet transfers with assist, with/without cues using equipment (OT) Flowsheets (Taken 12/03/2023 1605) LTG: Pt will perform toilet transfers with assistance level of: Supervision/Verbal cueing   Problem: RH  Tub/Shower Transfers Goal: LTG Patient will perform tub/shower transfers w/assist (OT) Description: LTG: Patient will perform tub/shower transfers with assist, with/without cues using equipment (OT) Flowsheets (Taken 12/03/2023 1605) LTG: Pt will perform tub/shower stall transfers with assistance level of: Supervision/Verbal cueing

## 2023-12-03 NOTE — Progress Notes (Signed)
 Physical Therapy Session Note  Patient Details  Name: Daniel Herrera MRN: [id number] Date of Birth: 02/10/1941  Today's Date: 12/03/2023 PT Individual Time: 1300-1345 PT Individual Time Calculation (min): 45 min   Short Term Goals: Week 1:  PT Short Term Goal 1 (Week 1): Pt will complete transfers with CGA consistently with LRAD PT Short Term Goal 2 (Week 1): Pt will complete gait iwth LRAD with CGA 150' PT Short Term Goal 3 (Week 1): Pt will complete stair negotiation with CGA and min verbal cues for LLE awareness  Skilled Therapeutic Interventions/Progress Updates:    Pt seated in w/c on arrival and agreeable to therapy. No complaint of pain. Pt transported to therapy gym for time management and energy conservation.   Stand pivot transfer using arm rest for UE support with light min a. Pt participated in x 2 rounds of corn hole, sit<>stand to pick up bag and throw in standing. CGA-min a d/t L lean for balance. Cues for slower eccentric control with descent. Progressed from bags on table to bags on floor to increase balance challenge.   Pt then stood with CGA while cleaning corn hole bags for bimanual task, cues for standing with BLE off mat table to increase balance. Pt then performed ambulatory transfer to nustep with min a required when turning to sit d/t RW placed to far anteriorly.   Pt used nustep  for global strength and reciprocal motion x 10 minutes using hills profile, level 5-10. Avg 49 spm. Pt reports 6-7/10 intensity. Pt then returned to room and remained in w/c to await next session, was left with all needs in reach and alarm active.   Therapy Documentation Precautions:  Precautions Precautions: Fall Recall of Precautions/Restrictions: Impaired Restrictions Weight Bearing Restrictions Per Provider Order: No General:       Therapy/Group: Individual Therapy  [name] 12/03/2023, 1:34 PM

## 2023-12-03 NOTE — Progress Notes (Signed)
 PROGRESS NOTE   Subjective/Complaints: No new concerns or complaints.  Continues to have left shoulder pain-chronic overall under control,.  Patient does not want any medications, ointments or other treatments for her shoulder.   ROS: Denies N/V, abdominal pain, constipation, SOB, diarrhea, cough, chest pain, new weakness or paraesthesias, new vision changes   Objective:   No results found.  Recent Labs    12/03/23 0540  WBC 6.1  HGB 12.4*  HCT 35.1*  PLT 175    Recent Labs    12/03/23 0540  NA 136  K 4.0  CL 107  CO2 19*  GLUCOSE 106*  BUN 36*  CREATININE 1.97*  CALCIUM  9.1     Intake/Output Summary (Last 24 hours) at 12/03/2023 1221 Last data filed at 12/03/2023 0900 Gross per 24 hour  Intake 956 ml  Output 800 ml  Net 156 ml        Physical Exam: Vital Signs Blood pressure 137/76, pulse 77, temperature 98.1 F (36.7 C), resp. rate 18, height 6' 1 (1.854 m), weight 81.9 kg, SpO2 97%.  General: No acute distress, appears comfortable lying in bed Mood and affect are appropriate Heart: Regular rate and rhythm no rubs murmurs or extra sounds Lungs: CTAB, nonlabored breathing Abdomen: Positive bowel sounds, soft nontender to palpation, nondistended Extremities: No clubbing, cyanosis, or edema Skin: No evidence of breakdown, no evidence of rash Neurologic: Cranial nerves II through XII grossly intact, motor strength is 5/5 in right and 4/5 left deltoid, bicep, tricep, grip, hip flexor, knee extensors, ankle dorsiflexor and plantar flexor Sensory exam normal sensation to light touch and proprioception in bilateral upper and lower extremities Cerebellar exam mild ataxia L FNF  Musculoskeletal: Full range of motion in all 4 extremities. No joint swelling      Assessment/Plan: 1. Functional deficits which require 3+ hours per day of interdisciplinary therapy in a comprehensive inpatient rehab  setting. Physiatrist is providing close team supervision and 24 hour management of active medical problems listed below. Physiatrist and rehab team continue to assess barriers to discharge/monitor patient progress toward functional and medical goals  Care Tool:  Bathing    Body parts bathed by patient: Right arm, Chest, Left arm, Abdomen, Front perineal area, Buttocks, Right upper leg, Left upper leg, Right lower leg, Left lower leg, Face         Bathing assist Assist Level: Minimal Assistance - Patient > 75% (For balance)     Upper Body Dressing/Undressing Upper body dressing   What is the patient wearing?: Pull over shirt    Upper body assist Assist Level: Set up assist    Lower Body Dressing/Undressing Lower body dressing      What is the patient wearing?: Pants, Underwear/pull up     Lower body assist Assist for lower body dressing: Minimal Assistance - Patient > 75%     Toileting Toileting    Toileting assist Assist for toileting: Minimal Assistance - Patient > 75%     Transfers Chair/bed transfer  Transfers assist     Chair/bed transfer assist level: Minimal Assistance - Patient > 75%     Locomotion Ambulation   Ambulation assist  Assist level: Moderate Assistance - Patient 50 - 74% Assistive device: Walker-rolling Max distance: 60'   Walk 10 feet activity   Assist     Assist level: Minimal Assistance - Patient > 75% Assistive device: Walker-rolling   Walk 50 feet activity   Assist    Assist level: Moderate Assistance - Patient - 50 - 74% Assistive device: Walker-rolling    Walk 150 feet activity   Assist Walk 150 feet activity did not occur: Safety/medical concerns         Walk 10 feet on uneven surface  activity   Assist Walk 10 feet on uneven surfaces activity did not occur: Safety/medical concerns         Wheelchair     Assist Is the patient using a wheelchair?: Yes Type of Wheelchair: Manual     Wheelchair assist level: Supervision/Verbal cueing Max wheelchair distance: 200'    Wheelchair 50 feet with 2 turns activity    Assist        Assist Level: Supervision/Verbal cueing   Wheelchair 150 feet activity     Assist      Assist Level: Supervision/Verbal cueing   Blood pressure 137/76, pulse 77, temperature 98.1 F (36.7 C), resp. rate 18, height 6' 1 (1.854 m), weight 81.9 kg, SpO2 97%.   Medical Problem List and Plan: 1. Functional deficits secondary to right thalamic infarct, small SAH post CABG             -he has made nice neurological gains, but still with sensory/propioceptive deficits and ataxia.              -patient may shower             -ELOS/Goals: 5-7 days, mod I to supervision   - Stable to continue inpatient rehab  - 8/28: Reports of increased numbness/tingling through his left arm and face late this morning after evaluation; repeat CT head unchanged.  Neurology consult appreciated , no new infarct  Symptoms c/w R thalamic infarct previously diagnosed  2.  Antithrombotics: -DVT/anticoagulation:  Mechanical: Sequential compression devices, below knee Bilateral lower extremities             -antiplatelet therapy: Aspirin  81 mg (hx of bleeding on plavix )   3. Pain Management: Arthritis bilat hands and ankles: request home med nabumetone  750 mg---will resume   - No complaints of pain 8-27  - 9/1 reports continued left shoulder pain, reports chronic.  Patient says this is under control and does not want any changes made at this time  4. Mood/Behavior/Sleep: LCSW to follow for evaluation and support when available.              -antipsychotic agents:  Does not want Buspar  makes me feel horrible --discontinue as needed.  Denies need of anything for anxiety.             -Insomnia: Requesting home Temazepam  15 mg for insomnia-ordered --using as needed, with significant benefit             5. Neuropsych/cognition: This patient is capable of making  decisions on his own behalf. 6. Skin/Wound Care: routine pressure relief measures  7. Fluids/Electrolytes/Nutrition: monitor I&O and follow up chemistries              -Heart healthy diet   8. Hereditary angioedema: Patient supplied med (icatibant ) is in pharmacy fridge-patient had 1 episode of angioedema on 8/25 which was successfully treated with icatibant  PRN.              -  uses takhzyro  inj sq every 2 weeks at home   - 8/28: Family brought Takhzyro  form home - reviewed med data sheet, no contraindications with stroke or interactions with current meds - ordered for every other Friday (pharmacy will need to confirm he did not get it day of admission 8/22)  9. CAD s/p stenting HLD: LDL 106 on Pitavastatin  4 mg daily and Zetia               -Start Leqvio as outpatient therapy             -see meds below for PAF/HTN/CHF             -nitro prn CP  10. HTN/CHF: norvasc  and losartan               -bp goal of less than 180/105   - Blood pressure well-controlled  8-28: Blood pressure soft overnight and this a.m.; will get orthostatic vitals.  Will get daily weights for CHF.  8/29 BP stable continue current regimen and monitor/follow asked nursing to check weights, no signs of overload  9/1 BP overall controlled, suspect weights not accurate.  Does not appear to be fluid overload    12/03/2023    8:43 AM 12/03/2023    8:32 AM 12/03/2023    5:22 AM  Vitals with BMI  Weight  180 lbs 8 oz   BMI  23.82   Systolic 137  147  Diastolic 76  80  Pulse 77  70    Filed Weights   12/02/23 1541 12/03/23 0500 12/03/23 0832  Weight: 84.5 kg 86.1 kg 81.9 kg    11. T2DM: A1c 5.6    - Blood sugars well-controlled on current regimen  -9/1 controlled continue current regimen and monitor Recent Labs    12/02/23 2056 12/03/23 0619 12/03/23 1209  GLUCAP 133* 121* 114*     12. Post op PAF after CABG             -HR now controlled, regular             -metoprolol  xl 25mg  daily,  13. CKD IIIb. Cr has been  stable around  1.8   - Creatinine 1.77, stable.  Monitor . - 9/1 BUN/creatinine overall higher today at 1.97/36, continue to monitor   14. Hx of prostate cancer/hematuria 15. Hx hypothyroid--levothyroxine   16. Hx of interstitial lung disease/allergies/OSA   - Sleep study performed downstairs, started on CPAP             -albuterol  prn             -flonase  and azelastine  nasal sprays bid   - Add petroleum jelly to nares as needed before placing nasal pillow--ordered standing 8-28  - 8-28: Patient reports that final sleep study stated that he does not need CPAP.  Bedside educational manual seems to just be general information on stroke, but patient is insistent that he cannot tolerate it and will not use it for now on.  17. Constipation. Home dulcolax 15 mg daily ordered per patient request.   - 9/1 LBM 8/29, add MiraLAX , sorbitol  as needed LOS: 6 days A FACE TO FACE EVALUATION WAS PERFORMED  Murray Collier 12/03/2023, 12:21 PM

## 2023-12-04 LAB — GLUCOSE, CAPILLARY
Glucose-Capillary: 113 mg/dL — ABNORMAL HIGH (ref 70–99)
Glucose-Capillary: 95 mg/dL (ref 70–99)
Glucose-Capillary: 175 mg/dL — ABNORMAL HIGH (ref 70–99)
Glucose-Capillary: 137 mg/dL — ABNORMAL HIGH (ref 70–99)

## 2023-12-04 MED ORDER — ORAL CARE MOUTH RINSE: 15.0000 mL | OROMUCOSAL | Status: DC | PRN

## 2023-12-04 NOTE — Patient Care Conference (Cosign Needed Addendum)
 Inpatient RehabilitationTeam Conference and Plan of Care Update Date: 12/04/2023   Time: 1037 am  Patient was admitted after the interdisciplinary team meeting took place on 11/27/23; as a result, the first team conference occurred on day 8 of this patient's stay.     Patient Name: Daniel Herrera      Medical Record Number: [id number]  Date of Birth: 1940-07-20 Sex: Male         Room/Bed: 4W21C/4W21C-01 Payor Info: Payor: MEDICARE / Plan: MEDICARE PART A AND B / Product Type: *No Product type* /    Admit Date/Time:  11/27/2023  3:16 PM  Primary Diagnosis:  CVA (cerebral vascular accident) [facility])  Hospital Problems: Principal Problem:   CVA (cerebral vascular accident) [facility])    Expected Discharge Date: Expected Discharge Date: 12/11/23  Team Members Present: Physician leading conference: Dr. Arthea Gunther Social Worker Present: Graeme Jude, LCSW Nurse Present: Eulalio Falls, RN PT Present: Catilin Osborn, PT OT Present: Nereida Habermann, OT SLP Present: Recardo Mole, SLP PPS Coordinator present : Eleanor Colon, SLP     Current Status/Progress Goal Weekly Team Focus  Bowel/Bladder      Continent of bowel and bladder Constipation- refuses miralax  only dulcolax tabs    Maintain continence of bowel and bladder    Assess bowel and bladder q shift  Swallow/Nutrition/ Hydration               ADL's   CGA-Min A for standing balance during self-care. Mod cuing for safety awareness (attention to L-side & management of impulsivity). Improved LUE ataxia. Barriers: L-sided hemiparesis & decreased midline orientation/standing balance.   Downgraded to supervision due to decreased safety awareness.   LUE/LLE NMR, standing balance, caregiver education, discharge planning.    Mobility   bed mobility supervision, transfers CGA, gait with RW CGA/min assist 180'   supervision ambulatory  barriers: coordination, decreased caregiver support; focus on NMR for L hemi body, gait  training    Communication                Safety/Cognition/ Behavioral Observations               Pain      Left shoulder pain- does not want any meds /treatments          Skin      No skin issues   Skin remain free of infection entire stay on rehab    Assess skin q shift    Discharge Planning:  Pt will d/c to home with wife who can only provide supervision due to residual affects from stroke (physical and speech). Reports will have neighbors and friends check in. Son and DIL live around the corner and can only offer PRN support. Pt will need to be as independent as possible. SW will confirm there are no barriers to discharge.    Team Discussion: Patient was admitted post right thalamic infarct. Progress limited by left sided hemiparesis, left upper extremity ataxia, decreased midline orientation / standing balance . Patient needing cues for decreased safety awareness and impulsivity.   Patient on target to meet rehab goals: Currently patient needs CGA-min assistance for ADLs. Patient needs CGA with transfers and patient was able to ambulate up to 180' with minimal assistance using a rolling walker. Overall goals at discharge are set for supervision assistance.   *See Care Plan and progress notes for long and short-term goals.   Revisions to Treatment Plan:  Downgraded goals   Teaching Needs: Safety, medications, toileting, transfers,  etc Home health follow up   Current Barriers to Discharge: Decreased caregiver support and Home enviroment access/layout  Possible Resolutions to Barriers: Family Education Home health follow up     Medical Summary Current Status: CVA, DM2, CKD IIIb, consipation, HTN, CHF  Barriers to Discharge: Self-care education;Renal Insufficiency/Failure;Medical stability  Barriers to Discharge Comments: CVA, DM2, CKD IIIb, consipation, HTN, CHF Possible Resolutions to [employer] Focus: recheck labs tomorrow, monitor CBG, monitor WT and  fluid status, consider additional laxative   Continued Need for Acute Rehabilitation Level of Care: The patient requires daily medical management by a physician with specialized training in physical medicine and rehabilitation for the following reasons: Direction of a multidisciplinary physical rehabilitation program to maximize functional independence : Yes Medical management of patient stability for increased activity during participation in an intensive rehabilitation regime.: Yes Analysis of laboratory values and/or radiology reports with any subsequent need for medication adjustment and/or medical intervention. : Yes   I attest that I was present, lead the team conference, and concur with the assessment and plan of the team.   Yarithza Mink Gayo 12/04/2023, 1037 am

## 2023-12-04 NOTE — Progress Notes (Addendum)
 Patient ID: Daniel Herrera, male   DOB: [birth year], 83 y.o.   MRN: [id number]  SW met with pt in room to provide updates from team conference, d/c date 9/9, and d/c recs HHPT/OT. SW discussed family edu. He states his wife will be with him all the time but she does not drive and he mainly helps her. SW asked who is primary contact. States to call his dtr Daniel Herrera #[phone]. SW will follow-up with therapy about his request for rollator if possible.   *SW spoke with pt dtr Daniel Herrera to discuss above. Fam edu scheduled for Thursday 2pm-4pm (her, husband, and mother). She  reports she lives 3 mi from their home, and 7 mi from work. Confirms that her mother is there are all times. States while her mother has trouble speaking, they are able to understand her. She will confirm HHA with her mother.   Confirms [facility] is [facility]. SW informed will discuss if he can be accepted again.   SW sent HHPT/OT/aide referral to [facility] and waiting on follow-up. *referral accepted/.  Graeme Jude, MSW, LCSW Office: [phone] Cell: [phone] Fax: [phone]

## 2023-12-04 NOTE — Progress Notes (Signed)
 Occupational Therapy Session Note  Patient Details  Name: Daniel Herrera MRN: [id number] Date of Birth: [birth year]  Today's Date: 12/04/2023 OT Individual Time: 9094-9044 OT Individual Time Calculation (min): 50 min   Today's Date: 12/04/2023 OT Individual Time: 1450-1538 OT Individual Time Calculation (min): 48 min   Short Term Goals: Week 1:  OT Short Term Goal 1 (Week 1): STGs=LTGs due to patient's estimated length of stay.  Skilled Therapeutic Interventions/Progress Updates:   Session 1: Pt greeted sitting in [facility] for skilled OT session with focus on standing balance and LUE/LLE NMR.   Pain: Pt with no reports of pain, endorses L-shoulder soreness, OT offering intermediate rest breaks and positioning suggestions throughout session to address pain/fatigue and maximize participation/safety in session.   Functional Transfers: Sit<>stands with CGA throughout activities, cuing for safe hand placement.   Self Care Tasks: No needs this session.   Therapeutic Activities: Pt instructed in table top activity targeting functional reach with LUE and lateral weight-shift/WB onto LLE. Pt requires Min A + unilateral support on table top, multimodal cuing provided to avoid full lock-out of L-knee during activity. Pt uses LUE to reach across midline to retrieve/place objects.   Pt then participates in modified volleyball game, using LUE only, to target reaction time/coordination. Pt demo's significant improvement in gross movement of limb, only missing ~10% of shots.   Pt remained sitting in WC  with 4Ps assessed and immediate needs met. Pt continues to be appropriate for skilled OT intervention to promote further functional independence in ADLs/IADLs.   Session 2: Pt greeted sitting in [facility] for skilled OT session with focus on LUE [facility] and BADL retraining.   Pain: Pt with no reports of pain. OT offering intermediate rest breaks and positioning suggestions throughout session to address pain/fatigue  and maximize participation/safety in session.   Functional Transfers: Ambulatory bathroom transfers with CGA + RW.   Self Care Tasks: Pt completes the following self care tasks with levels of assistance noted below, Bathing/dressing with close supervision + unilateral support on grab bar for standing pericare. CGA for standing management of clothing items, including during toileting. Min A provided for doffing socks due to fit of material   Therapeutic Activities: 9 Hole Peg Test administered, which is used to measure finger dexterity in pts with various neurological diagnoses. The pt completed the test in R=~28 secs & L=~ 75 seconds. Scores are based on the time taken to complete the activity. The timer started the moment the pt touched the first peg until the moment the last peg hit the container. Pt then instructed in bolt board activity further targeting LUE dexterity.  Pt remained sitting in WC with 4Ps assessed and immediate needs met. Pt continues to be appropriate for skilled OT intervention to promote further functional independence in ADLs/IADLs.   Therapy Documentation Precautions:  Precautions Precautions: Fall Recall of Precautions/Restrictions: Impaired Restrictions Weight Bearing Restrictions Per Provider Order: No   Therapy/Group: Individual Therapy  Nereida Habermann, OTR/L, MSOT  12/04/2023, 7:53 AM

## 2023-12-04 NOTE — Plan of Care (Signed)
  Problem: Consults Goal: RH STROKE PATIENT EDUCATION Description: See Patient Education module for education specifics  Outcome: Progressing   Problem: RH BLADDER ELIMINATION Goal: RH STG MANAGE BLADDER WITH ASSISTANCE Description: STG Manage Bladder With mod I  Assistance Outcome: Progressing   Problem: RH SAFETY Goal: RH STG ADHERE TO SAFETY PRECAUTIONS W/ASSISTANCE/DEVICE Description: STG Adhere to Safety Precautions With cues Assistance/Device. Outcome: Progressing   Problem: RH KNOWLEDGE DEFICIT Goal: RH STG INCREASE KNOWLEDGE OF DIABETES Description: Patient and fspouse will be able to manage dm using educational resources for medications and dietary modification independently Outcome: Progressing Goal: RH STG INCREASE KNOWLEDGE OF HYPERTENSION Description: Patient and spouse will be able to manage HTN using educational resources for medications and dietary modification independently Outcome: Progressing Goal: RH STG INCREASE KNOWLEGDE OF HYPERLIPIDEMIA Description: Patient and spouse will be able to manage HLD using educational resources for medications and dietary modification independently Outcome: Progressing Goal: RH STG INCREASE KNOWLEDGE OF STROKE PROPHYLAXIS Description: Patient and spouse will be able to manage secondary risks using educational resources for medications and dietary modification independently Outcome: Progressing

## 2023-12-04 NOTE — Progress Notes (Signed)
 PROGRESS NOTE   Subjective/Complaints: Working with therapy in the gym today. Reports using flonase  for seasonal allergies, he does not want any additional medication. No additional concerns or complaints elicited.    ROS: Denies N/V, abdominal pain, constipation, SOB, diarrhea, cough, chest pain, new weakness or paraesthesias, new vision changes +runny nose- chronic due to allergies   Objective:   No results found.  Recent Labs    12/03/23 0540  WBC 6.1  HGB 12.4*  HCT 35.1*  PLT 175    Recent Labs    12/03/23 0540  NA 136  K 4.0  CL 107  CO2 19*  GLUCOSE 106*  BUN 36*  CREATININE 1.97*  CALCIUM  9.1     Intake/Output Summary (Last 24 hours) at 12/04/2023 0958 Last data filed at 12/04/2023 0754 Gross per 24 hour  Intake 960 ml  Output 450 ml  Net 510 ml        Physical Exam: Vital Signs Blood pressure 134/66, pulse 77, temperature 97.8 F (36.6 C), resp. rate 16, height 6' 1 (1.854 m), weight 81.9 kg, SpO2 97%.  General: No acute distress, working with therapy in the gym Mood and affect are appropriate Heart: Regular rate and rhythm no rubs murmurs or extra sounds Lungs: CTAB, nonlabored breathing Abdomen: Positive bowel sounds, soft nontender to palpation, nondistended Extremities: No clubbing, cyanosis, or edema Skin: No evidence of breakdown, no evidence of rash Neurologic: Cranial nerves II through XII grossly intact, motor strength is 5/5 in right and 4/5 left deltoid, bicep, tricep, grip, hip flexor, knee extensors, ankle dorsiflexor and plantar flexor Sensory exam normal sensation to light touch and proprioception in bilateral upper and lower extremities Cerebellar exam mild ataxia L FNF  Musculoskeletal: Full range of motion in all 4 extremities. No joint swelling      Assessment/Plan: 1. Functional deficits which require 3+ hours per day of interdisciplinary therapy in a comprehensive  inpatient rehab setting. Physiatrist is providing close team supervision and 24 hour management of active medical problems listed below. Physiatrist and rehab team continue to assess barriers to discharge/monitor patient progress toward functional and medical goals  Care Tool:  Bathing    Body parts bathed by patient: Right arm, Chest, Left arm, Abdomen, Front perineal area, Buttocks, Right upper leg, Left upper leg, Right lower leg, Left lower leg, Face         Bathing assist Assist Level: Minimal Assistance - Patient > 75% (For balance)     Upper Body Dressing/Undressing Upper body dressing   What is the patient wearing?: Pull over shirt    Upper body assist Assist Level: Set up assist    Lower Body Dressing/Undressing Lower body dressing      What is the patient wearing?: Pants, Underwear/pull up     Lower body assist Assist for lower body dressing: Minimal Assistance - Patient > 75%     Toileting Toileting    Toileting assist Assist for toileting: Minimal Assistance - Patient > 75%     Transfers Chair/bed transfer  Transfers assist     Chair/bed transfer assist level: Minimal Assistance - Patient > 75%     Locomotion Ambulation   Ambulation  assist      Assist level: Moderate Assistance - Patient 50 - 74% Assistive device: Walker-rolling Max distance: 60'   Walk 10 feet activity   Assist     Assist level: Minimal Assistance - Patient > 75% Assistive device: Walker-rolling   Walk 50 feet activity   Assist    Assist level: Moderate Assistance - Patient - 50 - 74% Assistive device: Walker-rolling    Walk 150 feet activity   Assist Walk 150 feet activity did not occur: Safety/medical concerns         Walk 10 feet on uneven surface  activity   Assist Walk 10 feet on uneven surfaces activity did not occur: Safety/medical concerns         Wheelchair     Assist Is the patient using a wheelchair?: Yes Type of Wheelchair:  Manual    Wheelchair assist level: Supervision/Verbal cueing Max wheelchair distance: 200'    Wheelchair 50 feet with 2 turns activity    Assist        Assist Level: Supervision/Verbal cueing   Wheelchair 150 feet activity     Assist      Assist Level: Supervision/Verbal cueing   Blood pressure 134/66, pulse 77, temperature 97.8 F (36.6 C), resp. rate 16, height 6' 1 (1.854 m), weight 81.9 kg, SpO2 97%.   Medical Problem List and Plan: 1. Functional deficits secondary to right thalamic infarct, small SAH post CABG             -he has made nice neurological gains, but still with sensory/propioceptive deficits and ataxia.              -patient may shower             -ELOS/Goals: 5-7 days, mod I to supervision   - Stable to continue inpatient rehab  - 8/28: Reports of increased numbness/tingling through his left arm and face late this morning after evaluation; repeat CT head unchanged.  Neurology consult appreciated , no new infarct  Symptoms c/w R thalamic infarct previously diagnosed  2.  Antithrombotics: -DVT/anticoagulation:  Mechanical: Sequential compression devices, below knee Bilateral lower extremities             -antiplatelet therapy: Aspirin  81 mg (hx of bleeding on plavix )   3. Pain Management: Arthritis bilat hands and ankles: request home med nabumetone  750 mg---will resume   - No complaints of pain 8-27  - 9/1 reports continued left shoulder pain, reports chronic.  Patient says this is under control and does not want any changes made at this time  -9/2 pain overall controlled, continue current regimen  4. Mood/Behavior/Sleep: LCSW to follow for evaluation and support when available.              -antipsychotic agents:  Does not want Buspar  makes me feel horrible --discontinue as needed.  Denies need of anything for anxiety.             -Insomnia: Requesting home Temazepam  15 mg for insomnia-ordered --using as needed, with significant benefit              5. Neuropsych/cognition: This patient is capable of making decisions on his own behalf. 6. Skin/Wound Care: routine pressure relief measures  7. Fluids/Electrolytes/Nutrition: monitor I&O and follow up chemistries              -Heart healthy diet   8. Hereditary angioedema: Patient supplied med (icatibant ) is in pharmacy fridge-patient had 1 episode of angioedema on 8/25  which was successfully treated with icatibant  PRN.              -uses takhzyro  inj sq every 2 weeks at home   - 8/28: Family brought Takhzyro  form home - reviewed med data sheet, no contraindications with stroke or interactions with current meds - ordered for every other Friday (pharmacy will need to confirm he did not get it day of admission 8/22)  9. CAD s/p stenting HLD: LDL 106 on Pitavastatin  4 mg daily and Zetia               -Start Leqvio as outpatient therapy             -see meds below for PAF/HTN/CHF             -nitro prn CP  10. HTN/CHF: norvasc  and losartan               -bp goal of less than 180/105   - Blood pressure well-controlled  8-28: Blood pressure soft overnight and this a.m.; will get orthostatic vitals.  Will get daily weights for CHF.  8/29 BP stable continue current regimen and monitor/follow asked nursing to check weights, no signs of overload  9/2 BP well controlled, no signs of fluid overload noted    12/04/2023    7:31 AM 12/04/2023    4:55 AM 12/03/2023    8:48 PM  Vitals with BMI  Systolic 134 131 880  Diastolic 66 72 73  Pulse 77 72 72    Filed Weights   12/02/23 1541 12/03/23 0500 12/03/23 0832  Weight: 84.5 kg 86.1 kg 81.9 kg    11. T2DM: A1c 5.6    - Blood sugars well-controlled on current regimen  -9/2 Cbgs well controlled, continue current regimen  Recent Labs    12/03/23 1639 12/03/23 2102 12/04/23 0620  GLUCAP 132* 124* 113*     12. Post op PAF after CABG             -HR now controlled, regular             -metoprolol  xl 25mg  daily,  13. CKD IIIb. Cr has been stable  around  1.8   - Creatinine 1.77, stable.  Monitor . - 9/1 BUN/creatinine overall higher today at 1.97/36  Recheck labs tomorrow  14. Hx of prostate cancer/hematuria 15. Hx hypothyroid--levothyroxine   16. Hx of interstitial lung disease/allergies/OSA   - Sleep study performed downstairs, started on CPAP             -albuterol  prn             -flonase  and azelastine  nasal sprays bid   - Add petroleum jelly to nares as needed before placing nasal pillow--ordered standing 8-28  - 8-28: Patient reports that final sleep study stated that he does not need CPAP.  Bedside educational manual seems to just be general information on stroke, but patient is insistent that he cannot tolerate it and will not use it for now on.  17. Constipation. Home dulcolax 15 mg daily ordered per patient request.   - 9/1 LBM 8/29, add MiraLAX , sorbitol  as needed  -9/2 consider additional medication if no BM this AM LOS: 7 days A FACE TO FACE EVALUATION WAS PERFORMED  Murray Collier 12/04/2023, 9:58 AM

## 2023-12-04 NOTE — Progress Notes (Signed)
 Physical Therapy Session Note  Patient Details  Name: Daniel Herrera MRN: [id number] Date of Birth: [birth year]  Today's Date: 12/04/2023 PT Individual Time: 9194-9154 + [id number] PT Individual Time Calculation (min): 40 min + 40 min  Short Term Goals: Week 1:  PT Short Term Goal 1 (Week 1): Pt will complete transfers with CGA consistently with LRAD PT Short Term Goal 2 (Week 1): Pt will complete gait iwth LRAD with CGA 150' PT Short Term Goal 3 (Week 1): Pt will complete stair negotiation with CGA and min verbal cues for LLE awareness  Skilled Therapeutic Interventions/Progress Updates:    SESSION 1: Pt presents in room in [facility], agreeable to PT. Pt denies pain at this time, reports having had muscle spasms in LLE last night but declines intervention. Session focused on NMR for dynamic standing balance and BLE coordination and muscle fiber recruitment. Pt self propels with BLEs from room to day room as warm up and BLE coordination. Pt completes sit<>stand transfers with [facility] with CGA. Pt ambulates 10' and 2x30' with [facility], completed as NMR to facilitate dynamic standing balance and R lateral weight shift as well as LLE coordination. Pt ambulates with RW 180' with CGA/min assist with cues for R lateral weightshift to facilitate L foot clearance, completed as NMR to promote high repetition gait training. Pt ambulates with [facility] with 2' with min assist and mod cues for sequencing. Pt then ambulates without device 3x10' with min/mod assist cues for postural stability and upright posture. Pt provided with seated rest breaks between all gait trials and exercises to promote energy conservation and quality with tasks. Pt self propels back to room with BUE/BLE as NMR for BUE/BLE coordination. Pt returns to room and remains seated in [facility] with all needs within reach, cal light in place and chair alarm donned and activated at end of session.   SESSION 2: Pt presents in room seated in WC, agreeable to PT. Pt  denies pain. Session focused on NMR for dynamic standing balance, BLE coordinaiton, single limb stability, and midline orientation. Pt transported via WC to hallway outside of main gym dependently for time management and energy conservatoin. Pt completes transfers with CGA throughout session. Pt then completes NMR with mirror positioned in front of pt to facilitate improved midline orientation and BLE coordination including: - step taps 4 step LLE x10 RUE support on HR - step taps 4 step x20 RUE support on HR alternating BLE - step taps 4 step 2x20 alternating BLE no UE support - gait no device 130' with min/mod assist and cues for upright posture, BLE coordination, and decreasing speed with task; pt demo LLE and truncal ataxia with task Pt then ambulates 130' with rollator with CGA/min assist and cues for speed management and LLE foot clearance as pt demonstrating decreasred foot clearance with fatigue. Pt returns to room and remains seated in [facility] with all needs within reach, cal light in place at end of session.      Therapy Documentation Precautions:  Precautions Precautions: Fall Recall of Precautions/Restrictions: Impaired Restrictions Weight Bearing Restrictions Per Provider Order: No    Therapy/Group: Individual Therapy  Reche Ohara PT, DPT 12/04/2023, 8:48 AM

## 2023-12-05 DIAGNOSIS — G8929 Other chronic pain: Secondary | ICD-10-CM

## 2023-12-05 DIAGNOSIS — M25512 Pain in left shoulder: Secondary | ICD-10-CM

## 2023-12-05 LAB — CBC
HCT: 36.8 % — ABNORMAL LOW (ref 39.0–52.0)
Hemoglobin: 12.6 g/dL — ABNORMAL LOW (ref 13.0–17.0)
MCH: 31.7 pg (ref 26.0–34.0)
MCHC: 34.2 g/dL (ref 30.0–36.0)
MCV: 92.5 fL (ref 80.0–100.0)
Platelets: 178 K/uL (ref 150–400)
RBC: 3.98 MIL/uL — ABNORMAL LOW (ref 4.22–5.81)
RDW: 12.4 % (ref 11.5–15.5)
WBC: 5.4 K/uL (ref 4.0–10.5)
nRBC: 0 % (ref 0.0–0.2)

## 2023-12-05 LAB — BASIC METABOLIC PANEL WITH GFR
Anion gap: 8 (ref 5–15)
BUN: 33 mg/dL — ABNORMAL HIGH (ref 8–23)
CO2: 23 mmol/L (ref 22–32)
Calcium: 9 mg/dL (ref 8.9–10.3)
Chloride: 105 mmol/L (ref 98–111)
Creatinine, Ser: 1.71 mg/dL — ABNORMAL HIGH (ref 0.61–1.24)
GFR, Estimated: 39 mL/min — ABNORMAL LOW (ref >60)
Glucose, Bld: 108 mg/dL — ABNORMAL HIGH (ref 70–99)
Potassium: 4.1 mmol/L (ref 3.5–5.1)
Sodium: 136 mmol/L (ref 135–145)

## 2023-12-05 LAB — GLUCOSE, CAPILLARY
Glucose-Capillary: 106 mg/dL — ABNORMAL HIGH (ref 70–99)
Glucose-Capillary: 103 mg/dL — ABNORMAL HIGH (ref 70–99)
Glucose-Capillary: 117 mg/dL — ABNORMAL HIGH (ref 70–99)
Glucose-Capillary: 135 mg/dL — ABNORMAL HIGH (ref 70–99)

## 2023-12-05 MED ORDER — WHITE PETROLATUM EX OINT
TOPICAL_OINTMENT | CUTANEOUS | Status: DC | PRN
  Filled 2023-12-05: qty 28.35

## 2023-12-05 MED ORDER — DICLOFENAC SODIUM 1 % EX GEL
2.0000 g | Freq: Four times a day (QID) | CUTANEOUS | Status: DC
  Administered 2023-12-05 – 2023-12-10 (×12): 2 g via TOPICAL
  Filled 2023-12-05: qty 100

## 2023-12-05 NOTE — Progress Notes (Signed)
  Occupational Therapy Session Note  Patient Details  Name: Daniel Herrera MRN: [id number] Date of Birth: [birth year]  Today's Date: 12/05/2023 OT Individual Time: 9284-9184 OT Individual Time Calculation (min): 60 min  and Today's Date: 12/05/2023 OT Missed Time: 10 Minutes Missed Time Reason: Other (comment) (Eating)  Today's Date: 12/05/2023 OT Individual Time: 8694-8597 OT Individual Time Calculation (min): 57 min   Short Term Goals: Week 1:  OT Short Term Goal 1 (Week 1): STGs=LTGs due to patient's estimated length of stay.  Skilled Therapeutic Interventions/Progress Updates:   Session 1: Pt greeted sitting in WC, requesting time to finish his breakfast, upon OT return skilled OT session with focus on LUE/LLE NMR, standing balance, and activity tolerance for carryover into safe ADL participation.   Pain: Pt continues to report L-shoulder sensation of heaviness, like a lady's purse is hanging on it. OT offering intermediate rest breaks and positioning suggestions throughout session to address pain/fatigue and maximize participation/safety in session.   Functional Transfers: Sit<>stands with CGA, cuing for hand positioning to not pull on RW and control descent.  Self Care Tasks: No needs this session.   Therapeutic Activities: Pt instructed in toe tap activity, completing 3 x reps of single leg taps, targeting coordination with LLE and tolerance of WB through single leg when using RLE to tap. Pt requires Min A overall for standing balance with use of RW, OT downgrades activity when taps performed using RLE from cone to yoga block to manage decreased strength in LLE. Cuing provided speed of movements to decrease ataxic movements in limb.   Pt then participates in dynamic sitting balance activity with emphasis placed on functional reach/coordination of LUE to toss bean bags and knock down targets. Pt completes x2 reps of activity with supervision-CGA for sitting balance, accuracy  improving with repetitions and cuing for pacing/concentration.   Pt remained sitting on toilet, NT present for direct handoff, 4Ps assessed and immediate needs met. Pt continues to be appropriate for skilled OT intervention to promote further functional independence in ADLs/IADLs.    Session 2: Pt greeted sitting in recliner for skilled OT session with focus on general conditioning and LUE NMR.   Pain: Pt with reports of L-sided soreness from therapeutic activities. OT offering intermediate rest breaks and positioning suggestions throughout session to address pain/fatigue and maximize participation/safety in session.   Functional Transfers: Rollator utilized for short distance, room-level, ambulation. Min A provided + verbal cuing to safe use brakes. Stand-pivots without AD at CGA-Min A level.   Therapeutic Exercise: Pt completes ~10 mins of Nustep modality for BUE/BLE conditioning for carryover into safe ADL/mobility participation. Pt tolerates level 4 resistance. With use of yellow theraputty, pt instructed in gross grip, isolated opposition, thumb flexion, digital abduction, and retrieval of x10 beads all for digital strengthening/L-hand NMR. Pt is then able pick-up/translate/stack groupings of ~4 coin-shaped items using L-hand.   Pt remained sitting in WC with 4Ps assessed and immediate needs met. Pt continues to be appropriate for skilled OT intervention to promote further functional independence in ADLs/IADLs.    Therapy Documentation Precautions:  Precautions Precautions: Fall Recall of Precautions/Restrictions: Impaired Restrictions Weight Bearing Restrictions Per Provider Order: No   Therapy/Group: Individual Therapy  Nereida Habermann, OTR/L, MSOT  12/05/2023, 6:59 AM

## 2023-12-05 NOTE — Discharge Summary (Signed)
 Physician Discharge Summary  Patient ID: Daniel Herrera MRN: [id number] DOB/AGE: 09-07-1940 83 y.o.  Admit date: 11/27/2023 Discharge date: 12/10/2023  Discharge Diagnoses:  Principal Problem:   CVA (cerebral vascular accident) [facility]) Active Problems:   ILD (interstitial lung disease) (HCC)   Essential hypertension   Dyslipidemia, goal LDL below 70   Colitis   CAD S/P percutaneous coronary angioplasty   Type 2 diabetes mellitus with unspecified complications (HCC)   Hereditary angioedema type 2 (HCC)   CKD (chronic kidney disease) stage 3, GFR 30-59 ml/min   NSTEMI (non-ST elevated myocardial infarction) (HCC)   PAF (paroxysmal atrial fibrillation) (HCC)   Coronary artery disease   Thyroid  disease   GERD (gastroesophageal reflux disease)   CHF (congestive heart failure) (HCC)   GERD without esophagitis   Type 2 diabetes mellitus without complications (HCC)   Stroke (cerebrum) (HCC)   Discharged Condition: stable  Significant Diagnostic Studies: CT HEAD WO CONTRAST ( ) Result Date: 11/29/2023 EXAM: CT HEAD WITHOUT CONTRAST 11/29/2023 11:22:17 AM TECHNIQUE: CT of the head was performed without the administration of intravenous contrast. Automated exposure control, iterative reconstruction, and/or weight based adjustment of the mA/kV was utilized to reduce the radiation dose to as low as reasonably achievable. COMPARISON: CT of the head dated 11/25/2023. CLINICAL HISTORY: Numbness LUE. FINDINGS: BRAIN AND VENTRICLES: No acute hemorrhage. Gray-white differentiation is preserved. No hydrocephalus. No extra-axial collection. No mass effect or midline shift. Lacunar infarct again demonstrated in the right posterolateral thalamus and posterior limb of the internal capsule. Small-to-moderate periventricular white matter disease. ORBITS: The patient is status post bilateral lens replacement. SINUSES: Moderate opacification of the left maxillary sinus. SOFT TISSUES AND SKULL: No acute soft  tissue abnormality. No skull fracture. Extensive calcifications within the carotid siphons. IMPRESSION: 1. Lacunar infarct in the right posterolateral thalamus and posterior limb of the internal capsule, again demonstrated. 2. Mild-to-moderate periventricular white matter disease. 3. Moderate opacification of the left maxillary sinus. Electronically signed by: Evalene Coho MD 11/29/2023 11:32 AM EDT RP Workstation: [id number]   CT HEAD WO CONTRAST ( ) Result Date: 11/25/2023 CLINICAL DATA:  83 year old male code stroke presentation on 11/23/2023. Chronically occluded left vertebral artery. Status post TNK, possible trace left vertex subarachnoid hemorrhage on post treatment MRI. EXAM: CT HEAD WITHOUT CONTRAST TECHNIQUE: Contiguous axial images were obtained from the base of the skull through the vertex without intravenous contrast. RADIATION DOSE REDUCTION: This exam was performed according to the departmental dose-optimization program which includes automated exposure control, adjustment of the mA and/or kV according to patient size and/or use of iterative reconstruction technique. COMPARISON:  Brain MRI 1711 hours 11/24/2023. Presentation head CT 11/23/2023. FINDINGS: Brain: Hypodensity at the right thalamic lacunar infarct site corresponding to DWI findings (series 3, image 17). No mass effect. No ventriculomegaly. Patchy chronic left cerebellar infarcts redemonstrated. Stable gray-white differentiation elsewhere. Trace subarachnoid hemorrhage is confirmed along the left vertex. Compare sagittal image 43 today to series 6, image 21 on 11/23/2023. Heidelberg classification 3c: Subarachnoid hemorrhage. No intracranial mass effect. No IVH. No ventriculomegaly. No other intracranial hemorrhage identified. Vascular: Calcified atherosclerosis at the skull base. No suspicious intracranial vascular hyperdensity. Skull: Appears stable and intact. Sinuses/Orbits: Left maxillary sinus fluid and mucosal thickening  is stable. Other: No acute orbit or scalp soft tissue finding. IMPRESSION: 1. Subtle but confirmed trace left vertex subarachnoid hemorrhage (sagittal image 43). No intracranial mass effect or other complicating features. 2. Right thalamic infarct stable from MRI yesterday. 3. No other acute intracranial abnormality. Salient findings were  communicated to Dr. MAGDALENO Blower at 6:04 am on 11/25/2023 by text page via the [facility] messaging system. Electronically Signed   By: VEAR Hurst M.D.   On: 11/25/2023 06:05   MR BRAIN WO CONTRAST Result Date: 11/25/2023 CLINICAL DATA:  83 year old male code stroke presentation on 11/23/2023. Chronically occluded left vertebral artery. Status post TNK. EXAM: MRI HEAD WITHOUT CONTRAST TECHNIQUE: Multiplanar, multiecho pulse sequences of the brain and surrounding structures were obtained without intravenous contrast. COMPARISON:  CT head and CTA head and neck 11/23/2018. Brain MRI 06/14/2021. FINDINGS: Brain: Oval 15 mm focus of intense restricted diffusion right thalamus, near the posterior limb right internal capsule (series 2, image 26). T2 and FLAIR hyperintensity. No hemorrhage or mass effect. Contralateral left superior frontal lobe subcortical white matter subcentimeter diffusion heterogeneity on series 2, image 12 appears to be either T2 shine through or susceptibility associated with chronic hemosiderin there on SWI (series 7, image 19) which is new since 2023. No hyperdense hemorrhage there by CT 1 day earlier., however, there is trace abnormal increased FLAIR signal in the sulci there (series 6, image 5). No regional edema or mass effect. No IVH or other acute intracranial hemorrhage identified. Chronic microhemorrhage in the left cerebellum on series 7, image 85 is new since 2023. No other convincing restricted diffusion. No midline shift, mass effect, evidence of mass lesion, ventriculomegaly. Cervicomedullary junction and pituitary are within normal limits. Patchy encephalomalacia  in the left cerebellum PICA territory. Elsewhere stable gray and white matter signal compared to 2023. Vascular: Major intracranial vascular flow voids are stable since 2023 with occluded distal left vertebral artery (series 5, image 23). Skull and upper cervical spine: Visualized bone marrow signal is within normal limits. Partially visible cervical spine degeneration. Sinuses/Orbits: Stable. Chronic left maxillary sinus mucosal thickening and fluid level. Other: Stable mastoid aeration. Visible internal auditory structures appear normal. Negative visible scalp and face. IMPRESSION: 1. Positive for: - 1.5 cm acute lacunar infarct of the right thalamus. No hemorrhagic transformation there. No mass effect. - evidence of trace SAH left superior convexity (possible Heidelberg classification 3c: Subarachnoid hemorrhage.). Recommend repeat noncontrast Head CT to further evaluate. 2. No intracranial mass effect. No other acute intracranial abnormality. Chronic distal left vertebral artery occlusion and patchy chronic left cerebellar infarcts. The finding of possible SAH and recommendation for Head CT were communicated to Dr. MAGDALENO Blower at 3:58 am on 11/25/2023 by text page via the [facility] messaging system. Electronically Signed   By: VEAR Hurst M.D.   On: 11/25/2023 03:58   ECHOCARDIOGRAM COMPLETE Result Date: 11/24/2023    ECHOCARDIOGRAM REPORT   Patient Name:   GAVINO FOUCH Date of Exam: 11/24/2023 Medical Rec #:  [id number]         Height:       73.0 in Accession #:    [id number]        Weight:       193.3 lb Date of Birth:  23-Nov-1940         BSA:          2.121 m Patient Age:    83 years          BP:           137/75 mmHg Patient Gender: M                 HR:           61 bpm. Exam Location:  Inpatient Procedure: 2D Echo (Both Spectral and Color  Flow Doppler were utilized during            procedure). Indications:    stroke  History:        Patient has prior history of Echocardiogram examinations, most                  recent 06/15/2021. CAD, Prior CABG, chronic kidney disease; Risk                 Factors:Hypertension, Dyslipidemia and Diabetes.  Sonographer:    Tinnie Barefoot RDCS Referring Phys: [phone] MCNEILL P KIRKPATRICK IMPRESSIONS  1. Left ventricular ejection fraction, by estimation, is 60 to 65%. The left ventricle has normal function. The left ventricle has no regional wall motion abnormalities. There is mild left ventricular hypertrophy. Left ventricular diastolic parameters are consistent with Grade I diastolic dysfunction (impaired relaxation).  2. Right ventricular systolic function is mildly reduced. The right ventricular size is normal. There is normal pulmonary artery systolic pressure. The estimated right ventricular systolic pressure is 27.6 mmHg.  3. The mitral valve is grossly normal. Trivial mitral valve regurgitation.  4. The aortic valve is tricuspid. Aortic valve regurgitation is not visualized.  5. The inferior vena cava is normal in size with greater than 50% respiratory variability, suggesting right atrial pressure of 3 mmHg. Comparison(s): Changes from prior study are noted. 06/15/2021: LVEF 55-60%. FINDINGS  Left Ventricle: Left ventricular ejection fraction, by estimation, is 60 to 65%. The left ventricle has normal function. The left ventricle has no regional wall motion abnormalities. The left ventricular internal cavity size was normal in size. There is  mild left ventricular hypertrophy. Left ventricular diastolic parameters are consistent with Grade I diastolic dysfunction (impaired relaxation). Indeterminate filling pressures. Right Ventricle: The right ventricular size is normal. No increase in right ventricular wall thickness. Right ventricular systolic function is mildly reduced. There is normal pulmonary artery systolic pressure. The tricuspid regurgitant velocity is 2.48 m/s, and with an assumed right atrial pressure of 3 mmHg, the estimated right ventricular systolic pressure is 27.6  mmHg. Left Atrium: Left atrial size was normal in size. Right Atrium: Right atrial size was normal in size. Pericardium: There is no evidence of pericardial effusion. Mitral Valve: The mitral valve is grossly normal. Trivial mitral valve regurgitation. Tricuspid Valve: The tricuspid valve is grossly normal. Tricuspid valve regurgitation is trivial. Aortic Valve: The aortic valve is tricuspid. Aortic valve regurgitation is not visualized. Pulmonic Valve: The pulmonic valve was grossly normal. Pulmonic valve regurgitation is trivial. Aorta: The aortic root and ascending aorta are structurally normal, with no evidence of dilitation. Venous: The inferior vena cava is normal in size with greater than 50% respiratory variability, suggesting right atrial pressure of 3 mmHg. IAS/Shunts: No atrial level shunt detected by color flow Doppler.  LEFT VENTRICLE PLAX 2D LVIDd:         5.20 cm   Diastology LVIDs:         3.30 cm   LV e' medial:    5.98 cm/s LV PW:         1.10 cm   LV E/e' medial:  12.7 LV IVS:        1.10 cm   LV e' lateral:   11.60 cm/s LVOT diam:     2.00 cm   LV E/e' lateral: 6.5 LV SV:         55 LV SV Index:   26 LVOT Area:     3.14 cm  RIGHT VENTRICLE  IVC RV Basal diam:  2.70 cm    IVC diam: 1.20 cm RV S prime:     8.16 cm/s TAPSE (M-mode): 1.0 cm LEFT ATRIUM             Index        RIGHT ATRIUM           Index LA diam:        3.80 cm 1.79 cm/m   RA Area:     15.80 cm LA Vol (A2C):   59.3 ml 27.96 ml/m  RA Volume:   38.40 ml  18.11 ml/m LA Vol (A4C):   53.5 ml 25.23 ml/m LA Biplane Vol: 60.4 ml 28.48 ml/m  AORTIC VALVE LVOT Vmax:   80.20 cm/s LVOT Vmean:  51.400 cm/s LVOT VTI:    0.175 m  AORTA Ao Root diam: 3.10 cm Ao Asc diam:  3.40 cm MITRAL VALVE               TRICUSPID VALVE MV Area (PHT): 3.51 cm    TR Peak grad:   24.6 mmHg MV Decel Time: 216 msec    TR Vmax:        248.00 cm/s MV E velocity: 75.80 cm/s MV A velocity: 80.50 cm/s  SHUNTS MV E/A ratio:  0.94        Systemic VTI:   0.18 m                            Systemic Diam: 2.00 cm Vinie Maxcy MD Electronically signed by Vinie Maxcy MD Signature Date/Time: 11/24/2023/1:58:34 PM    Final    CT ANGIO HEAD NECK W WO CM (CODE STROKE) Result Date: 11/23/2023 CLINICAL DATA:  Neuro deficit, acute, stroke suspected EXAM: CT ANGIOGRAPHY HEAD AND NECK WITH AND WITHOUT CONTRAST TECHNIQUE: Multidetector CT imaging of the head and neck was performed using the standard protocol during bolus administration of intravenous contrast. Multiplanar CT image reconstructions and MIPs were obtained to evaluate the vascular anatomy. Carotid stenosis measurements (when applicable) are obtained utilizing NASCET criteria, using the distal internal carotid diameter as the denominator. RADIATION DOSE REDUCTION: This exam was performed according to the departmental dose-optimization program which includes automated exposure control, adjustment of the mA and/or kV according to patient size and/or use of iterative reconstruction technique. CONTRAST:  75mL OMNIPAQUE  IOHEXOL  350 MG/ML SOLN COMPARISON:  CTA head/neck March 14, 23 FINDINGS: CTA NECK FINDINGS Aortic arch: Great vessel origins are patent without significant stenosis. Right carotid system: No evidence of dissection, stenosis (50% or greater), or occlusion. Left carotid system: No evidence of dissection, stenosis (50% or greater), or occlusion. Vertebral arteries: Right dominant. The right vertebral arteries patent without significant stenosis. The left vertebral artery is occluded at its distal V2 segment with poor/irregular opacification more proximally. Skeleton: No evidence of acute abnormality limited assessment. Other neck: No acute abnormality on limited assessment. Upper chest: Biapical pleuroparenchymal scarring. Review of the MIP images confirms the above findings CTA HEAD FINDINGS Anterior circulation: Bilateral intracranial ICAs, MCAs, and ACAs are patent. Moderate left A2 ACA stenosis. Severe  right M2 MCA stenosis. Posterior circulation: The left intradural vertebral artery is largely non-opacified. Patent right vertebral artery with mild distal stenosis. The basilar artery and bilateral posterior is are patent. Severe left and multifocal moderate to severe right P2 PCA stenosis. Venous sinuses: As permitted by contrast timing, patent. Review of the MIP images confirms the above findings IMPRESSION: 1. Chronically occluded  left vertebral artery. 2. Severe left and moderate to severe right P2 PCA stenosis. 3. Severe right M2 MCA stenosis. 4. Moderate left A2 ACA stenosis. 5.  Aortic Atherosclerosis (ICD10-I70.0). Electronically Signed   By: Gilmore GORMAN Molt M.D.   On: 11/23/2023 19:14   CT HEAD CODE STROKE WO CONTRAST Result Date: 11/23/2023 CLINICAL DATA:  Code stroke. Neuro deficit, acute, stroke suspected. EXAM: CT HEAD WITHOUT CONTRAST TECHNIQUE: Contiguous axial images were obtained from the base of the skull through the vertex without intravenous contrast. RADIATION DOSE REDUCTION: This exam was performed according to the departmental dose-optimization program which includes automated exposure control, adjustment of the mA and/or kV according to patient size and/or use of iterative reconstruction technique. COMPARISON:  Head CT 09/03/2022. FINDINGS: Brain: Generalized cerebral atrophy. Patchy and ill-defined hypoattenuation within the cerebral white matter, nonspecific but compatible with mild chronic small vessel ischemic disease. Redemonstrated small chronic infarcts within the left cerebellar hemisphere. There is no acute intracranial hemorrhage. No demarcated cortical infarct. No extra-axial fluid collection. No evidence of an intracranial mass. No midline shift. Vascular: No hyperdense vessel.  Atherosclerotic calcifications. Skull: No calvarial fracture or aggressive osseous lesion. Sinuses/Orbits: No mass or acute finding within the imaged orbits. Severe mucosal thickening within the left  maxillary sinus. ASPECTS [facility] Stroke Program Early CT Score) - Ganglionic level infarction (caudate, lentiform nuclei, internal capsule, insula, M1-M3 cortex): 7 - Supraganglionic infarction (M4-M6 cortex): 3 Total score (0-10 with 10 being normal): 10 No evidence of an acute intracranial abnormality. These results were communicated to Dr. Michaela at 6:48 pmon 8/22/2025by text page via the [facility] messaging system. IMPRESSION: 1.  No evidence of an acute intracranial abnormality. 2. Parenchymal atrophy and chronic small vessel ischemic disease. 3. Redemonstrated small chronic infarcts within the left cerebellar hemisphere. 4. Severe mucosal thickening within the left maxillary sinus. Electronically Signed   By: Rockey Childs D.O.   On: 11/23/2023 18:48    Labs:  Basic Metabolic Panel: Recent Labs  Lab 12/05/23 0500 12/10/23 0509  NA 136 138  K 4.1 4.2  CL 105 104  CO2 23 20*  GLUCOSE 108* 111*  BUN 33* 30*  CREATININE 1.71* 2.26*  CALCIUM  9.0 8.9    CBC: Recent Labs  Lab 12/05/23 0500 12/10/23 0509  WBC 5.4 6.3  HGB 12.6* 12.3*  HCT 36.8* 35.2*  MCV 92.5 91.0  PLT 178 174    CBG: Recent Labs  Lab 12/09/23 1115 12/09/23 1632 12/09/23 2044 12/10/23 0531 12/10/23 1145  GLUCAP 118* 129* 160* 125* 124*    Brief HPI:   Daniel Herrera is a 83 y.o. male  with PMHx of hereditary angioedema type II, A-fib after CABG, hypertension, hyperlipidemia, CAD status post CABG, MI, stroke, CHF, CKD, GERD, GI bleed, prostate cancer, hypothyroidism and diabetes presented to the [facility] ER on 8/22 with complaints of acute onset of left-sided weakness. The patient reported that he was sitting in a chair eating a sandwich when he suddenly found himself unable to get up to go to the bathroom. He experienced numbness and an inability to use his left leg, which led to a fall. His family promptly transported him to the emergency department. On initial evaluation, his NIH Stroke  was  9, indicating left-sided facial droop, left arm weakness, left leg weakness, ataxia in both arms, decreased sensation on the left, and dysarthria. A CT of the head showed no acute process, and a CTA head & neck Severe left and moderate to severe right  P2 PCA stenosis. Severe right M2 MCA stenosis. Moderate left A2 ACA stenosis. Chronically occluded left vertebral artery. An MRI revealed a 1.5 cm acute lacunar infarct in the right thalamus without hemorrhage or mass effect. Additionally, there was evidence of trace subarachnoid hemorrhage [facility]) at the left superior convexity. Patient was considered an appropriate candidate for TNK, which was administered. Repeat CT Head- Subtle but confirmed trace left vertex subarachnoid hemorrhage (sagittal image 43). No intracranial mass effect or other complicating features. He was placed on Asprin alone due to prior hx of bleeding on Plavix . 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 He currently requires moderate assistance with mobility and basic activities of daily living (ADLs). Therapy evaluations completed due to patient decreased functional mobility was admitted for a comprehensive rehab program.     Hospital Course: Daniel Herrera was admitted to rehab 11/27/2023 for inpatient therapies to consist of PT, ST and OT at least three hours five days a week. Past admission physiatrist, therapy team and rehab RN have worked together to provide customized collaborative inpatient rehab. He was maintained on aspirin  as there was hx of GI bleed on plavix .   Daniel Herrera was admitted to inpatient rehab due to functional deficits following a right thalamic infarct and small subarachnoid hemorrhages post-coronary artery bypass graft. He demonstrated significant neurological improvement but retained sensory perceptive deficits and ataxia. His arthritis and left shoulder pain were managed effectively with pain  control measures. He continued taking temazepam  for insomnia. During his in-patient stay, he experienced an episode of hereditary angioedema, which was treated with Icatibant  as needed. Otherwise no episodes of angioedema in rehabilitation.  For hyperlipidemia, he was maintained on pitavastatin  4 mg daily and Zetia .   Blood pressures were monitored TID. His hypertension was well controlled, though he experienced a brief episode of hypotension that resolved quickly, and he remained on Norvasc  and losartan . Diabetes has been monitored with ac/hs CBG checks and SSI was use prn for tighter BS control. Diabetic teaching was completed. His type 2 diabetes was managed with stable blood sugar levels, reflected in an A1C of 5.6. Post-operative atrial fibrillation after CABG was controlled with metoprolol  daily. For CKD stage 3B, BUN and creatinine levels improved with oral hydration, though creatinine was slightly elevated at discharge, and he was advised to stay hydrated. His hypothyroidism was managed with levothyroxine . Despite a sleep study and initiation of CPAP for obstructive sleep apnea, he declined using CPAP, insisting intolerance. He will be followed up outpatient for sleep apnea. Constipation was managed with docusate, leading to regular bowel movements .On 8/28 patient complained of left facial numbness, worsening left side weakness, LUE to shoulder numbness and pain while working with therapy. CT head showed no new changes from prior, neurology evaluated no new orders obtained. Advised to continue current treatment. He is discharging one day early due to family transportation arrangements.    Rehab course: During patient's stay in rehab weekly team conferences were held to monitor patient's progress, set goals and discuss barriers to discharge. At admission, patient required moderate assistance with mobility and basic activities of daily living (ADLs). Daniel Herrera made good progress in occupational  therapy, meeting all long-term goals for functional use of his left upper and lower extremities, and will benefit from ongoing skilled OT. He transitioned from requiring contact guard assistance to needing only close supervision. In physical therapy, he was able to ambulate over 200 feet with ankle  weights, requiring contact guard assist, minimal assistance, and cues for left lower extremity foot clearance. He met all long-term goals in physical therapy and will discharge at an ambulatory level supervision, benefiting from ongoing skilled PT and OT outpatient. He has had improvement in activity tolerance, balance, postural control as well as ability to compensate for deficits. He has had improvement in functional use LUE and LLE as well as improvement in awareness  Family teaching completed upon discharge to home with son in-law Evalene Scrape. Medications have been returned from pharmacy to the patient at discharge.     Disposition:  Discharge disposition: 06-Home-Health Care Svc        Diet: Cardiac Diet   Special Instructions:  -based on recent creatinine level, patient is encouraged to drink plenty of fluids.   -No driving or operating heavy machinery until cleared by provider  -No smoking or alcohol  or illicit drug use    Discharge Instructions     Ambulatory referral to Neurology   Complete by: As directed    An appointment is requested in approximately: 8 weeks   Ambulatory referral to Physical Medicine Rehab   Complete by: As directed    Ambulatory referral to Sleep Studies   Complete by: As directed       Allergies as of 12/10/2023       Reactions   Zestril  [lisinopril ] Anaphylaxis, Swelling   Face, lips, and throat swell   Buspirone  Other (See Comments)   Didn't like how it made him feel   Roxicodone  [oxycodone ] Other (See Comments)   Hallucinations   Tape Other (See Comments)   PLASTIC TAPE TEARS OFF THE SKIN- Please use an alternative!!   Benadryl  [diphenhydramine Hcl] Swelling, Other (See Comments)   Facial swelling from high dose (tolerates Advil  PM as needed)   Darvocet [propoxyphene N-acetaminophen ] Other (See Comments)   Hallucinations   Darvon [propoxyphene Hcl] Other (See Comments)   Hallucination   Norvasc  [amlodipine ] Palpitations, Rash, Other (See Comments)   Heart pounding Headache  Rash on ankles   Tylenol  [acetaminophen ] Swelling, Other (See Comments)   Foot swelling        Medication List     STOP taking these medications    Mucinex  Maximum Strength 1200 MG Tb12 Generic drug: Guaifenesin    OVER THE COUNTER MEDICATION       TAKE these medications    albuterol  108 (90 Base) MCG/ACT inhaler Commonly known as: VENTOLIN  HFA Inhale 2 puffs into the lungs. Every 4-6 hours as needed   amLODipine  2.5 MG tablet Commonly known as: NORVASC  TAKE 1 TABLET BY MOUTH EVERY DAY   artificial tears ophthalmic solution Place 1 drop into both eyes as needed for dry eyes.   aspirin  EC 81 MG tablet Take 1 tablet (81 mg total) by mouth daily.   Azelastine  HCl 137 MCG/SPRAY Soln Place 1 spray into both nostrils 2 (two) times daily.   betamethasone  valerate ointment 0.1 % Commonly known as: VALISONE  Apply 1 application topically 2 (two) times daily as needed for rash.   ezetimibe  10 MG tablet Commonly known as: ZETIA  Take 1 tablet (10 mg total) by mouth daily.   fluoruracil 0.5 % cream Commonly known as: CARAC  Apply to skin cancer for 1-7 days during 'flare up' as directed.   fluticasone  50 MCG/ACT nasal spray Commonly known as: FLONASE  Place 1 spray into both nostrils 2 (two) times daily.   icatibant  Acetate 30 MG/3ML injection Commonly known as: FIRAZYR  Inject 30 mg into the skin  once as needed (HAE).   levothyroxine  100 MCG tablet Commonly known as: SYNTHROID  Take 100 mcg by mouth daily before breakfast.   Livalo  4 MG Tabs Generic drug: Pitavastatin  Calcium  Take 4 mg by mouth at bedtime.    losartan  25 MG tablet Commonly known as: COZAAR  Take 1 tablet (25 mg total) by mouth daily. What changed: when to take this   metoprolol  succinate 25 MG 24 hr tablet Commonly known as: TOPROL -XL Take 1 tablet (25 mg total) by mouth 2 (two) times daily.   Multivitamin Men 50+ Tabs Take 1 tablet by mouth daily.   nabumetone  750 MG tablet Commonly known as: RELAFEN  Take 750 mg by mouth daily.   nitroGLYCERIN  0.4 MG SL tablet Commonly known as: NITROSTAT  Place 0.4 mg under the tongue every 5 (five) minutes as needed for chest pain.   pantoprazole  40 MG tablet Commonly known as: PROTONIX  Take 1 tablet (40 mg total) by mouth 2 (two) times daily.   PROBIOTIC DAILY PO Take 1 capsule by mouth daily.   Takhzyro  300 MG/2ML Sosy Generic drug: Lanadelumab -flyo INJECT 300MG  SUBCUTANEOUSLY  EVERY 2 WEEKS What changed: See the new instructions.   temazepam  15 MG capsule Commonly known as: RESTORIL  Take 15 mg by mouth at bedtime.   VITAMIN B-12 PO Take 1 tablet by mouth daily.        Follow-up Information     [name], [name], NP Follow up.   Specialty: Internal Medicine Why: Call for an appointment for hostipal follow up Contact information: [address] [place] [address] [phone]         [facility] Follow up.   Specialty: Neurology Why: Office will call for appointment Contact information: [address] [place]  [address] [phone]        Emeline Joesph BROCKS, DO Follow up.   Specialty: Physical Medicine and Rehabilitation Why: Office will call for appointment Contact information: [address] [place] [address] [phone]                 Signed: Daphne LITTIE Finders 12/10/2023, 11:51 AM

## 2023-12-05 NOTE — Progress Notes (Signed)
 Physical Therapy Weekly Progress Note  Patient Details  Name: Daniel Herrera MRN: [id number] Date of Birth: Apr 29, 1940  Beginning of progress report period: November 28, 2023 End of progress report period: December 05, 2023  Today's Date: 12/05/2023 PT Individual Time: 1050-1200 PT Individual Time Calculation (min): 70 min   Patient has met 2 of 3 short term goals.  Pt making good progress towards functional goals but limited by safety awareness, poor midline orientation in standing and with dynamic functional mobility, and ataxia. Pt completes bed mobility with supervision, completes transfers with CGA with and without rollator, completes gait with rollator 220' with CGA/min assist, and up/down 12 steps with BHRs with CGA. Pt continues to require mod cues for safety awareness and gait mechanics during mobility. Pt scheduled for family ed on 12/06/23.  Patient continues to demonstrate the following deficits muscle weakness, decreased cardiorespiratoy endurance, impaired timing and sequencing, unbalanced muscle activation, and decreased coordination, decreased midline orientation and decreased attention to left, and decreased standing balance, decreased postural control, hemiplegia, and decreased balance strategies and therefore will continue to benefit from skilled PT intervention to increase functional independence with mobility.  Patient progressing toward long term goals..  Continue plan of care.  PT Short Term Goals Week 1:  PT Short Term Goal 1 (Week 1): Pt will complete transfers with CGA consistently with LRAD PT Short Term Goal 1 - Progress (Week 1): Met PT Short Term Goal 2 (Week 1): Pt will complete gait iwth LRAD with CGA 150' PT Short Term Goal 2 - Progress (Week 1): Progressing toward goal PT Short Term Goal 3 (Week 1): Pt will complete stair negotiation with CGA and min verbal cues for LLE awareness PT Short Term Goal 3 - Progress (Week 1): Met Week 2:  PT Short Term Goal 1 (Week  2): STG = LTG due to ELOS  Skilled Therapeutic Interventions/Progress Updates:  Pt presents in room in [facility], agreeable to PT. Pt denies pain at this time. Session focused on gait training with and without rollator as well as for stair negotiation, and NMR for dynamic standing balance, single limb stability, multidirectional stepping stability. Pt completes transfers with CGA throughout session. Pt ambulates with rollator on unit during session with CGA/min assist, cues for speed management with rollator and for LLE foot clearance, pt ambulating 150' from room to day room and 220' from day room to main gym. Pt takes seated rest break on rollator, cues for parking rollator against wall and locking brake prior to turning to sit on rollator. Pt then completes up/down 12 6 steps with BHRs, CGA and min cues for sequencing, provided with demonstration prior to stair training. Pt takes seated rest break on rollator. Pt ambulates to mat for NMR, completed to promote single limb stability, BLE coordination, dynamic standing balance, and weight shifting and midline orientation needed for functional ambulation and transfers including: - nustep x10 min pace controlled at 60 spm L7 resistance with BUE/BLE - forward step and back 3x10 BLE (LLE stepping to target, requires mod cues and min/mod assist for postural stability) - gait without device 3x95' (min/modA with first trial, minA 2nd, min/modA 3rd without ankle weight) with 1.5# ankle weights for 1st and 2nd trial Pt ambulates with rollator back to room with CGA/min assist and mod cues for LLE foot clearance with gait as pt demonstrating increased toe catch with fatigue. Pt returns to room and remains seated in [place] with all needs within reach, cal light in place and chair alarm  donned and activated at end of session.   Ambulation/gait training;Discharge planning;Functional mobility training;Psychosocial support;Therapeutic Activities;Visual/perceptual  remediation/compensation;Balance/vestibular training;Disease management/prevention;Neuromuscular re-education;Therapeutic Exercise;Wheelchair propulsion/positioning;Skin care/wound management;Cognitive remediation/compensation;DME/adaptive equipment instruction;Pain management;Splinting/orthotics;UE/LE Strength taining/ROM;Community reintegration;Patient/family education;Stair training;UE/LE Coordination activities   Therapy Documentation Precautions:  Precautions Precautions: Fall Recall of Precautions/Restrictions: Impaired Restrictions Weight Bearing Restrictions Per Provider Order: No    Therapy/Group: Individual Therapy  Reche Ohara PT, DPT 12/05/2023, 2:03 PM

## 2023-12-05 NOTE — Progress Notes (Signed)
 Occupational Therapy Weekly Progress Note  Patient Details  Name: Daniel Herrera MRN: [id number] Date of Birth: 09/29/1940  Beginning of progress report period: November 28, 2023 End of progress report period: December 05, 2023   Patient continues to make steady progress towards OT POC/LTGs. Pt currently requires CGA progressing to close supervision for ADLs to manage decreased dynamic standing balance during care. Pt has demonstrated strong improvements in LUE functioning, including a significant minimization in ataxic movements. Caregiver education has been scheduled with spouse, daughter, and SIL. Tentative discharge date has been set for 9/9.   Patient continues to demonstrate the following deficits: muscle weakness, decreased cardiorespiratoy endurance, impaired timing and sequencing, unbalanced muscle activation, ataxia, and decreased coordination, decreased attention to left, decreased attention, decreased awareness, decreased safety awareness, and decreased memory, and decreased standing balance, decreased postural control, and hemiplegia and therefore will continue to benefit from skilled OT intervention to enhance overall performance with BADL and Reduce care partner burden.  Patient progressing toward long term goals..  Continue plan of care.  OT Short Term Goals Week 1:  OT Short Term Goal 1 (Week 1): STGs=LTGs due to patient's estimated length of stay. OT Short Term Goal 1 - Progress (Week 1): Progressing toward goal Week 2:  OT Short Term Goal 1 (Week 2): STGs=LTGs due to patient's estimated length of stay.  Therapy Documentation Precautions:  Precautions Precautions: Fall Recall of Precautions/Restrictions: Impaired Restrictions Weight Bearing Restrictions Per Provider Order: No   Therapy/Group: Individual Therapy  Nereida Habermann, OTR/L, MSOT  12/05/2023, 7:11 AM

## 2023-12-05 NOTE — Progress Notes (Addendum)
 PROGRESS NOTE   Subjective/Complaints: Continues to have chronic left shoulder pain.  No additional concerns or complaints this morning.   ROS: Denies N/V, abdominal pain, constipation, SOB, diarrhea, cough, chest pain, new weakness or paraesthesias, new vision changes +runny nose- chronic due to allergies L shoulder pain   Objective:   No results found.  Recent Labs    12/03/23 0540 12/05/23 0500  WBC 6.1 5.4  HGB 12.4* 12.6*  HCT 35.1* 36.8*  PLT 175 178    Recent Labs    12/03/23 0540 12/05/23 0500  NA 136 136  K 4.0 4.1  CL 107 105  CO2 19* 23  GLUCOSE 106* 108*  BUN 36* 33*  CREATININE 1.97* 1.71*  CALCIUM  9.1 9.0     Intake/Output Summary (Last 24 hours) at 12/05/2023 1545 Last data filed at 12/05/2023 1316 Gross per 24 hour  Intake 720 ml  Output 800 ml  Net -80 ml        Physical Exam: Vital Signs Blood pressure 123/65, pulse 68, temperature 98 F (36.7 C), temperature source Oral, resp. rate 16, height 6' 1 (1.854 m), weight 82.4 kg, SpO2 99%.  General: No acute distress, appears comfortable sitting in his room Mood and affect are appropriate Heart: Regular rate and rhythm no rubs murmurs or extra sounds Lungs: CTAB, nonlabored breathing Abdomen: Positive bowel sounds, soft nontender to palpation, nondistended Extremities: No clubbing, cyanosis, or edema Skin: No evidence of breakdown, no evidence of rash Neurologic: Cranial nerves II through XII grossly intact, motor strength is 5/5 in right and 4/5 left deltoid, bicep, tricep, grip, hip flexor, knee extensors, ankle dorsiflexor and plantar flexor Sensory exam normal sensation to light touch and proprioception in bilateral upper and lower extremities Cerebellar exam mild ataxia L FNF   Prior neuro assessment is c/w today's exam 12/05/2023.   Musculoskeletal: Full range of motion in all 4 extremities. No joint swelling       Assessment/Plan: 1. Functional deficits which require 3+ hours per day of interdisciplinary therapy in a comprehensive inpatient rehab setting. Physiatrist is providing close team supervision and 24 hour management of active medical problems listed below. Physiatrist and rehab team continue to assess barriers to discharge/monitor patient progress toward functional and medical goals  Care Tool:  Bathing    Body parts bathed by patient: Right arm, Chest, Left arm, Abdomen, Front perineal area, Buttocks, Right upper leg, Left upper leg, Right lower leg, Left lower leg, Face         Bathing assist Assist Level: Minimal Assistance - Patient > 75% (For balance)     Upper Body Dressing/Undressing Upper body dressing   What is the patient wearing?: Pull over shirt    Upper body assist Assist Level: Set up assist    Lower Body Dressing/Undressing Lower body dressing      What is the patient wearing?: Pants, Underwear/pull up     Lower body assist Assist for lower body dressing: Minimal Assistance - Patient > 75%     Toileting Toileting    Toileting assist Assist for toileting: Minimal Assistance - Patient > 75%     Transfers Chair/bed transfer  Transfers assist  Chair/bed transfer assist level: Minimal Assistance - Patient > 75%     Locomotion Ambulation   Ambulation assist      Assist level: Moderate Assistance - Patient 50 - 74% Assistive device: Walker-rolling Max distance: 60'   Walk 10 feet activity   Assist     Assist level: Minimal Assistance - Patient > 75% Assistive device: Walker-rolling   Walk 50 feet activity   Assist    Assist level: Moderate Assistance - Patient - 50 - 74% Assistive device: Walker-rolling    Walk 150 feet activity   Assist Walk 150 feet activity did not occur: Safety/medical concerns         Walk 10 feet on uneven surface  activity   Assist Walk 10 feet on uneven surfaces activity did not occur:  Safety/medical concerns         Wheelchair     Assist Is the patient using a wheelchair?: Yes Type of Wheelchair: Manual    Wheelchair assist level: Supervision/Verbal cueing Max wheelchair distance: 200'    Wheelchair 50 feet with 2 turns activity    Assist        Assist Level: Supervision/Verbal cueing   Wheelchair 150 feet activity     Assist      Assist Level: Supervision/Verbal cueing   Blood pressure 123/65, pulse 68, temperature 98 F (36.7 C), temperature source Oral, resp. rate 16, height 6' 1 (1.854 m), weight 82.4 kg, SpO2 99%.   Medical Problem List and Plan: 1. Functional deficits secondary to right thalamic infarct, small SAH post CABG             -he has made nice neurological gains, but still with sensory/propioceptive deficits and ataxia.              -patient may shower             -ELOS/Goals: 5-7 days, mod I to supervision   - Stable to continue inpatient rehab  - 8/28: Reports of increased numbness/tingling through his left arm and face late this morning after evaluation; repeat CT head unchanged.  Neurology consult appreciated , no new infarct  Symptoms c/w R thalamic infarct previously diagnosed   - Expected discharge 12/11/2023 2.  Antithrombotics: -DVT/anticoagulation:  Mechanical: Sequential compression devices, below knee Bilateral lower extremities             -antiplatelet therapy: Aspirin  81 mg (hx of bleeding on plavix )   3. Pain Management: Arthritis bilat hands and ankles: request home med nabumetone  750 mg---will resume   - No complaints of pain 8-27  - 9/1 reports continued left shoulder pain, reports chronic.  Patient says this is under control and does not want any changes made at this time  -9/2 pain overall controlled, continue current regimen  9/3 Voltaren  gel for left shoulder pain, patient agreeable to try this  4. Mood/Behavior/Sleep: LCSW to follow for evaluation and support when available.               -antipsychotic agents:  Does not want Buspar  makes me feel horrible --discontinue as needed.  Denies need of anything for anxiety.             -Insomnia: Requesting home Temazepam  15 mg for insomnia-ordered --using as needed, with significant benefit             5. Neuropsych/cognition: This patient is capable of making decisions on his own behalf. 6. Skin/Wound Care: routine pressure relief measures  7. Fluids/Electrolytes/Nutrition: monitor I&O  and follow up chemistries              -Heart healthy diet   8. Hereditary angioedema: Patient supplied med (icatibant ) is in pharmacy fridge-patient had 1 episode of angioedema on 8/25 which was successfully treated with icatibant  PRN.              -uses takhzyro  inj sq every 2 weeks at home   - 8/28: Family brought Takhzyro  form home - reviewed med data sheet, no contraindications with stroke or interactions with current meds - ordered for every other Friday (pharmacy will need to confirm he did not get it day of admission 8/22)  9. CAD s/p stenting HLD: LDL 106 on Pitavastatin  4 mg daily and Zetia               -Start Leqvio as outpatient therapy             -see meds below for PAF/HTN/CHF             -nitro prn CP  10. HTN/CHF: norvasc  and losartan               -bp goal of less than 180/105   - Blood pressure well-controlled  8-28: Blood pressure soft overnight and this a.m.; will get orthostatic vitals.  Will get daily weights for CHF.  8/29 BP stable continue current regimen and monitor/follow asked nursing to check weights, no signs of overload  9/2-3  BP well controlled, no signs of fluid overload noted    12/05/2023   12:47 PM 12/05/2023    5:16 AM 12/04/2023    7:50 PM  Vitals with BMI  Weight  181 lbs 11 oz   BMI  23.97   Systolic 123 119 885  Diastolic 65 69 87  Pulse 68 72 65    Filed Weights   12/03/23 0500 12/03/23 0832 12/05/23 0516  Weight: 86.1 kg 81.9 kg 82.4 kg    11. T2DM: A1c 5.6    - Blood sugars well-controlled on  current regimen  -9/2-3 Cbgs well controlled, continue current regimen  Recent Labs    12/04/23 2010 12/05/23 0612 12/05/23 1207  GLUCAP 137* 106* 103*     12. Post op PAF after CABG             -HR now controlled, regular             -metoprolol  xl 25mg  daily,  13. CKD IIIb. Cr has been stable around  1.8   - Creatinine 1.77, stable.  Monitor . - 9/1 BUN/creatinine overall higher today at 1.97/36  -9/3 improved creatinine to 1.71/BUN 33  14. Hx of prostate cancer/hematuria 15. Hx hypothyroid--levothyroxine   16. Hx of interstitial lung disease/allergies/OSA   - Sleep study performed downstairs, started on CPAP             -albuterol  prn             -flonase  and azelastine  nasal sprays bid   - Add petroleum jelly to nares as needed before placing nasal pillow--ordered standing 8-28  - 8-28: Patient reports that final sleep study stated that he does not need CPAP.  Bedside educational manual seems to just be general information on stroke, but patient is insistent that he cannot tolerate it and will not use it for now on.  17. Constipation. Home dulcolax 15 mg daily ordered per patient request.   - 9/1 LBM 8/29, add MiraLAX , sorbitol  as needed  -9/3 LBM yesterday continue to monitor  LOS: 8 days A FACE TO FACE EVALUATION WAS PERFORMED  Murray Collier 12/05/2023, 3:45 PM

## 2023-12-06 LAB — GLUCOSE, CAPILLARY
Glucose-Capillary: 120 mg/dL — ABNORMAL HIGH (ref 70–99)
Glucose-Capillary: 137 mg/dL — ABNORMAL HIGH (ref 70–99)
Glucose-Capillary: 119 mg/dL — ABNORMAL HIGH (ref 70–99)
Glucose-Capillary: 144 mg/dL — ABNORMAL HIGH (ref 70–99)

## 2023-12-06 NOTE — Progress Notes (Signed)
 PROGRESS NOTE   Subjective/Complaints: Left shoulder pain a little improved with Voltaren .  Patient took Dulcolax last night so expects to have a bowel movement today.   ROS: Denies N/V, abdominal pain, constipation, SOB, diarrhea, cough, chest pain, new weakness or paraesthesias, new vision changes +runny nose- chronic due to allergies L shoulder pain- a little improved   Objective:   No results found.  Recent Labs    12/05/23 0500  WBC 5.4  HGB 12.6*  HCT 36.8*  PLT 178    Recent Labs    12/05/23 0500  NA 136  K 4.1  CL 105  CO2 23  GLUCOSE 108*  BUN 33*  CREATININE 1.71*  CALCIUM  9.0     Intake/Output Summary (Last 24 hours) at 12/06/2023 1739 Last data filed at 12/06/2023 1300 Gross per 24 hour  Intake 960 ml  Output 600 ml  Net 360 ml        Physical Exam: Vital Signs Blood pressure 131/63, pulse 60, temperature 98 F (36.7 C), temperature source Oral, resp. rate 16, height 6' 1 (1.854 m), weight 84.6 kg, SpO2 100%.  General: No acute distress, working with therapy in his room Mood and affect are appropriate Heart: Regular rate and rhythm no rubs murmurs or extra sounds Lungs: CTAB, nonlabored breathing Abdomen: Positive bowel sounds, soft nontender to palpation, nondistended Extremities: No clubbing, cyanosis, or edema Skin: No evidence of breakdown, no evidence of rash Neurologic: Cranial nerves II through XII grossly intact, motor strength is 5/5 in right and 4/5 left deltoid, bicep, tricep, grip, hip flexor, knee extensors, ankle dorsiflexor and plantar flexor Sensory exam normal sensation to light touch and proprioception in bilateral upper and lower extremities Cerebellar exam mild ataxia L FNF   Prior neuro assessment is c/w today's exam 12/06/2023.   Musculoskeletal: Full range of motion in all 4 extremities. No joint swelling.  No significant TTP left shoulder today       Assessment/Plan: 1. Functional deficits which require 3+ hours per day of interdisciplinary therapy in a comprehensive inpatient rehab setting. Physiatrist is providing close team supervision and 24 hour management of active medical problems listed below. Physiatrist and rehab team continue to assess barriers to discharge/monitor patient progress toward functional and medical goals  Care Tool:  Bathing    Body parts bathed by patient: Right arm, Chest, Left arm, Abdomen, Front perineal area, Buttocks, Right upper leg, Left upper leg, Right lower leg, Left lower leg, Face         Bathing assist Assist Level: Minimal Assistance - Patient > 75% (For balance)     Upper Body Dressing/Undressing Upper body dressing   What is the patient wearing?: Pull over shirt    Upper body assist Assist Level: Set up assist    Lower Body Dressing/Undressing Lower body dressing      What is the patient wearing?: Pants, Underwear/pull up     Lower body assist Assist for lower body dressing: Minimal Assistance - Patient > 75%     Toileting Toileting    Toileting assist Assist for toileting: Minimal Assistance - Patient > 75%     Transfers Chair/bed transfer  Transfers assist  Chair/bed transfer assist level: Minimal Assistance - Patient > 75%     Locomotion Ambulation   Ambulation assist      Assist level: Moderate Assistance - Patient 50 - 74% Assistive device: Walker-rolling Max distance: 60'   Walk 10 feet activity   Assist     Assist level: Minimal Assistance - Patient > 75% Assistive device: Walker-rolling   Walk 50 feet activity   Assist    Assist level: Moderate Assistance - Patient - 50 - 74% Assistive device: Walker-rolling    Walk 150 feet activity   Assist Walk 150 feet activity did not occur: Safety/medical concerns         Walk 10 feet on uneven surface  activity   Assist Walk 10 feet on uneven surfaces activity did not occur:  Safety/medical concerns         Wheelchair     Assist Is the patient using a wheelchair?: Yes Type of Wheelchair: Manual    Wheelchair assist level: Supervision/Verbal cueing Max wheelchair distance: 200'    Wheelchair 50 feet with 2 turns activity    Assist        Assist Level: Supervision/Verbal cueing   Wheelchair 150 feet activity     Assist      Assist Level: Supervision/Verbal cueing   Blood pressure 131/63, pulse 60, temperature 98 F (36.7 C), temperature source Oral, resp. rate 16, height 6' 1 (1.854 m), weight 84.6 kg, SpO2 100%.   Medical Problem List and Plan: 1. Functional deficits secondary to right thalamic infarct, small SAH post CABG             -he has made nice neurological gains, but still with sensory/propioceptive deficits and ataxia.              -patient may shower             -ELOS/Goals: 5-7 days, mod I to supervision   - Stable to continue inpatient rehab  - 8/28: Reports of increased numbness/tingling through his left arm and face late this morning after evaluation; repeat CT head unchanged.  Neurology consult appreciated , no new infarct  Symptoms c/w R thalamic infarct previously diagnosed   - Expected discharge 12/11/2023 2.  Antithrombotics: -DVT/anticoagulation:  Mechanical: Sequential compression devices, below knee Bilateral lower extremities             -antiplatelet therapy: Aspirin  81 mg (hx of bleeding on plavix )   3. Pain Management: Arthritis bilat hands and ankles: request home med nabumetone  750 mg---will resume   - No complaints of pain 8-27  - 9/1 reports continued left shoulder pain, reports chronic.  Patient says this is under control and does not want any changes made at this time  -9/2 pain overall controlled, continue current regimen  9/3 Voltaren  gel for left shoulder pain, patient agreeable to try this  9/4 left shoulder pain improved with Voltaren   4. Mood/Behavior/Sleep: LCSW to follow for evaluation  and support when available.              -antipsychotic agents:  Does not want Buspar  makes me feel horrible --discontinue as needed.  Denies need of anything for anxiety.             -Insomnia: Requesting home Temazepam  15 mg for insomnia-ordered --using as needed, with significant benefit             5. Neuropsych/cognition: This patient is capable of making decisions on his own behalf. 6. Skin/Wound Care: routine  pressure relief measures  7. Fluids/Electrolytes/Nutrition: monitor I&O and follow up chemistries              -Heart healthy diet   8. Hereditary angioedema: Patient supplied med (icatibant ) is in pharmacy fridge-patient had 1 episode of angioedema on 8/25 which was successfully treated with icatibant  PRN.              -uses takhzyro  inj sq every 2 weeks at home   - 8/28: Family brought Takhzyro  form home - reviewed med data sheet, no contraindications with stroke or interactions with current meds - ordered for every other Friday (pharmacy will need to confirm he did not get it day of admission 8/22)  9. CAD s/p stenting HLD: LDL 106 on Pitavastatin  4 mg daily and Zetia               -Start Leqvio as outpatient therapy             -see meds below for PAF/HTN/CHF             -nitro prn CP  10. HTN/CHF: norvasc  and losartan               -bp goal of less than 180/105   - Blood pressure well-controlled  8-28: Blood pressure soft overnight and this a.m.; will get orthostatic vitals.  Will get daily weights for CHF.  8/29 BP stable continue current regimen and monitor/follow asked nursing to check weights, no signs of overload  9/4 fair control, continue to monitor trend    12/06/2023    1:00 PM 12/06/2023    5:37 AM 12/05/2023    7:46 PM  Vitals with BMI  Weight  186 lbs 8 oz   BMI  24.61   Systolic 131 146 851  Diastolic 63 67 75  Pulse 60 66 59    Filed Weights   12/03/23 0832 12/05/23 0516 12/06/23 0537  Weight: 81.9 kg 82.4 kg 84.6 kg    11. T2DM: A1c 5.6    - Blood  sugars well-controlled on current regimen  -9/2-4 Cbgs well controlled, continue current regimen  Recent Labs    12/06/23 0557 12/06/23 1119 12/06/23 1634  GLUCAP 120* 137* 119*     12. Post op PAF after CABG             -HR now controlled, regular             -metoprolol  xl 25mg  daily,  13. CKD IIIb. Cr has been stable around  1.8   - Creatinine 1.77, stable.  Monitor . - 9/1 BUN/creatinine overall higher today at 1.97/36  -9/3 improved creatinine to 1.71/BUN 33  14. Hx of prostate cancer/hematuria 15. Hx hypothyroid--levothyroxine   16. Hx of interstitial lung disease/allergies/OSA   - Sleep study performed downstairs, started on CPAP             -albuterol  prn             -flonase  and azelastine  nasal sprays bid   - Add petroleum jelly to nares as needed before placing nasal pillow--ordered standing 8-28  - 8-28: Patient reports that final sleep study stated that he does not need CPAP.  Bedside educational manual seems to just be general information on stroke, but patient is insistent that he cannot tolerate it and will not use it for now on.  17. Constipation. Home dulcolax 15 mg daily ordered per patient request.   - 9/1 LBM 8/29, add MiraLAX , sorbitol  as needed  -9/4  chart review indicates he had bowel movement today, he reports he took Dulcolax last night LOS: 9 days A FACE TO FACE EVALUATION WAS PERFORMED  Murray Collier 12/06/2023, 5:39 PM

## 2023-12-06 NOTE — Progress Notes (Signed)
 Met with patient to review current situation, team conference and plan of care. Reviewed medications after stroke , co morbidities HTN, HLD, DM, follow up with the MD after discharge, depression, bowel bladder and dietary modifications. Continue to follow along to provide educational needs to facilitate preparation for discharge.

## 2023-12-06 NOTE — Progress Notes (Signed)
 Physical Therapy Session Note  Patient Details  Name: Daniel Herrera MRN: [id number] Date of Birth: 1940-12-27  Today's Date: 12/06/2023 PT Individual Time: 1130-1200, 1450-1533 PT Individual Time Calculation (min): 30 min, 43 min   Short Term Goals: Week 2:  PT Short Term Goal 1 (Week 2): STG = LTG due to ELOS  Skilled Therapeutic Interventions/Progress Updates:    Session 1: Pt seated in w/c on arrival and agreeable to therapy. No complaint of pain. Session focused on comparison of RW vs rollator in preparation for family ed in the pm. Pt ambulated to therapy gym with rollator and min a. Note LLE catching and LOB with up to mod required. Pt performed obstacle course with both rollator and RW, including weaving through cones and over small hurdles. Pt noted to perform faster, with greater precision, and with lower level of assist with RW (CGA with occ min vs min with occ mod). Discussed rollator vs RW and pt in agreement that he is safer with RW at this time. Pt returned to room with CGA-min a using RW. Pt then remained in w/c and was left with all needs in reach and alarm active.   Session 2: Pt seen for family education with his wife, daughter, SIL present. No complaint of pain. Family education session focused on mobility with RW, supervision goals. Therapist provided CGA throughout session with up to min a d/t fatigue at end of session while walking back to room. Family education provided with demo only d/t supervision level goals and current need for CGA and occ min a. Pt performed car transfer and ramp with RW and with railing per home set up. Recommend RW for increased safety over using rail. Discussed lifting and carrying and demoed pt ability to balance while performing bimanual tasks, but needing RW whenever feet are moving. Pt returned to room and remained in w/c, was left with all needs in reach and alarm active.  r  Therapy Documentation Precautions:  Precautions Precautions:  Fall Recall of Precautions/Restrictions: Impaired Restrictions Weight Bearing Restrictions Per Provider Order: No General:       Therapy/Group: Individual Therapy  Daniel Herrera 12/06/2023, 12:36 PM

## 2023-12-06 NOTE — Progress Notes (Signed)
 Occupational Therapy Session Note  Patient Details  Name: Daniel Herrera MRN: [id number] Date of Birth: Jun 20, 1940  Today's Date: 12/06/2023 OT Individual Time: [phone] OT Individual Time Calculation (min): 69 min   Today's Date: 12/06/2023 OT Individual Time: 8596-8554 OT Individual Time Calculation (min): 42 min   Short Term Goals: Week 2:  OT Short Term Goal 1 (Week 2): STGs=LTGs due to patient's estimated length of stay.  Skilled Therapeutic Interventions/Progress Updates:   Session 1: Pt greeted sitting in [facility] for skilled OT session with focus on standing balance/tolerance and BADL retraining.   Pain: Pt with no reports of pain. OT offering intermediate rest breaks and positioning suggestions throughout session to address pain/fatigue and maximize participation/safety in session.   Functional Transfers: Stand-pivots and multiple sit<>stands with CGA- Min A + no AD. Ambulatory shower transfer with CGA + RW.   Self Care Tasks: Pt completes full shower/dressing with close supervision/setup for standing balance, use of grab bars for UE support.    Therapeutic Activities: Blocked sit<>stands with 1in step underneath RLE for improved WB through LLE. Activity upgraded by having patient hold 3# medicine ball while completing task for further BLE strengthening. OT places tactile cue on mat to promote midline orientation upon sitting as patient tends shift right. Pt then engaged in forward/lateral weight-shifting activity for further LLE strengthening and dynamic standing balance challenge. Pt requires tactile cuing/manual facilitation to shift weight onto LLE, requiring up to Mod A + unilateral support on therapist arm to perform activity.   Pt remained sitting in WC with 4Ps assessed and immediate needs met. Pt continues to be appropriate for skilled OT intervention to promote further functional independence in ADLs/IADLs.   Session 2:   Pt greeted sitting in WC, no reports of pain,  family/caregivers present for scheduled education. Pt's daughter, SIL, and spouse present for caregiver education with focus on OT role, OT POC, and current patient functioning. Functional transfers and ADL routine reviewed, alongside impacts of L-sided ataxia, mild inattention, and decreased safety awareness (fast pacing vs impulsivity). Demo's walk-in shower transfer at [facility] + RW, recommended 24/7 supervision with CGA for initial showers and STE management. All receptive. Pt remained sitting in WC, awaiting PT session, with 4Ps assessed and immediate needs met. Pt continues to be appropriate for skilled OT intervention to promote further functional independence in ADLs/IADLs.   Therapy Documentation Precautions:  Precautions Precautions: Fall Recall of Precautions/Restrictions: Impaired Restrictions Weight Bearing Restrictions Per Provider Order: No   Therapy/Group: Individual Therapy  Nereida Habermann, OTR/L, MSOT  12/06/2023, 7:28 AM

## 2023-12-07 DIAGNOSIS — Z794 Long term (current) use of insulin: Secondary | ICD-10-CM

## 2023-12-07 LAB — GLUCOSE, CAPILLARY
Glucose-Capillary: 116 mg/dL — ABNORMAL HIGH (ref 70–99)
Glucose-Capillary: 102 mg/dL — ABNORMAL HIGH (ref 70–99)
Glucose-Capillary: 121 mg/dL — ABNORMAL HIGH (ref 70–99)
Glucose-Capillary: 136 mg/dL — ABNORMAL HIGH (ref 70–99)

## 2023-12-07 NOTE — Progress Notes (Signed)
 Physical Therapy Session Note  Patient Details  Name: Daniel Herrera MRN: [id number] Date of Birth: 1940-06-13  Today's Date: 12/07/2023 PT Individual Time: 1109-1204 PT Individual Time Calculation (min): 55 min   Short Term Goals: Week 2:  PT Short Term Goal 1 (Week 2): STG = LTG due to ELOS  Skilled Therapeutic Interventions/Progress Updates:    Pt presents in room in [facility], agreeable to PT. Pt denies pain. Session focused on NMR for dynamic standing balance, BLE coordination, midline orientation, and multidirectional stepping stability as well as gait training for tolerance to upright and rollator management. Pt completes transfers with supervision/CGA throughout session with and without device. Pt completes gait ~250' from room to day room then to main gym, no rest breaks, cues for L foot clearance with pt demonstrating CGA/close supervision initial 20' then progress to CGA with fatigue. Pt completes sitting in rollator with cues for parking rollator against wall and locking prior to sitting. Pt then ambulates 125' without device min assist with pt demonstrating improved weightshifting and postural stability, does continue to demonstrate occasional L foot catch but no LOB. Pt then completes NMR for dynamic standing balance, multidirectional stepping stability, single limb stability, BLE coordination including: - navigating around x3 cones 2 trials - forward/backward walking 3x10' - side stepping 3x10' - step taps 3 step no UE support 2x20 alternating BLE - step ups x5 BLE, x10 alternating BLE no UE support - gait no device 120' with bilateral ankle weights 3# (min/mod assist with pt navigating obstacles, cues for safety) Pt ambulates back to room with ankle weights donned ~200' with CGA/min assist and cues for LLE foot clearance. Pt returns to room and remains seated in [facility] with all needs within reach, cal light in place and chair alarm donned and activated at end of session.   Therapy  Documentation Precautions:  Precautions Precautions: Fall Recall of Precautions/Restrictions: Impaired Restrictions Weight Bearing Restrictions Per Provider Order: No   Therapy/Group: Individual Therapy  Reche Ohara PT, DPT 12/07/2023, 4:28 PM

## 2023-12-07 NOTE — Progress Notes (Signed)
 PROGRESS NOTE   Subjective/Complaints: No new medical complaints concerns.  Continues to feel altered sensation in his left arm described as heaviness.  Not particularly painful   ROS: Denies N/V, abdominal pain, constipation, SOB, diarrhea, cough, chest pain, new weakness or paraesthesias, new vision changes +runny nose- chronic due to allergies L shoulder sensory changes/heaviness-continued   Objective:   No results found.  Recent Labs    12/05/23 0500  WBC 5.4  HGB 12.6*  HCT 36.8*  PLT 178    Recent Labs    12/05/23 0500  NA 136  K 4.1  CL 105  CO2 23  GLUCOSE 108*  BUN 33*  CREATININE 1.71*  CALCIUM  9.0     Intake/Output Summary (Last 24 hours) at 12/07/2023 1439 Last data filed at 12/07/2023 1400 Gross per 24 hour  Intake 708 ml  Output 700 ml  Net 8 ml        Physical Exam: Vital Signs Blood pressure (!) 141/74, pulse 65, temperature 97.6 F (36.4 C), temperature source Oral, resp. rate 16, height 6' 1 (1.854 m), weight 85.2 kg, SpO2 97%.  General: No acute distress, sitting in his room, appears comfortable Mood and affect are appropriate Heart: Regular rate and rhythm no rubs murmurs or extra sounds Lungs: CTAB, nonlabored breathing Abdomen: Positive bowel sounds, soft nontender to palpation, nondistended Extremities: No clubbing, cyanosis, or edema Skin: No evidence of breakdown, no evidence of rash Neurologic: Cranial nerves II through XII grossly intact, motor strength is 5/5 in right and 4/5 left deltoid, bicep, tricep, grip, hip flexor, knee extensors, ankle dorsiflexor and plantar flexor Sensory exam normal sensation to light touch and proprioception in bilateral upper and lower extremities Cerebellar exam mild ataxia L FNF   Prior neuro assessment is c/w today's exam 12/07/2023.   Musculoskeletal: Full range of motion in all 4 extremities. No joint swelling.  No significant TTP left  shoulder today, no significant pain with ROM      Assessment/Plan: 1. Functional deficits which require 3+ hours per day of interdisciplinary therapy in a comprehensive inpatient rehab setting. Physiatrist is providing close team supervision and 24 hour management of active medical problems listed below. Physiatrist and rehab team continue to assess barriers to discharge/monitor patient progress toward functional and medical goals  Care Tool:  Bathing    Body parts bathed by patient: Right arm, Chest, Left arm, Abdomen, Front perineal area, Buttocks, Right upper leg, Left upper leg, Right lower leg, Left lower leg, Face         Bathing assist Assist Level: Minimal Assistance - Patient > 75% (For balance)     Upper Body Dressing/Undressing Upper body dressing   What is the patient wearing?: Pull over shirt    Upper body assist Assist Level: Set up assist    Lower Body Dressing/Undressing Lower body dressing      What is the patient wearing?: Pants, Underwear/pull up     Lower body assist Assist for lower body dressing: Minimal Assistance - Patient > 75%     Toileting Toileting    Toileting assist Assist for toileting: Minimal Assistance - Patient > 75%     Transfers Chair/bed transfer  Transfers assist     Chair/bed transfer assist level: Minimal Assistance - Patient > 75%     Locomotion Ambulation   Ambulation assist      Assist level: Moderate Assistance - Patient 50 - 74% Assistive device: Walker-rolling Max distance: 60'   Walk 10 feet activity   Assist     Assist level: Minimal Assistance - Patient > 75% Assistive device: Walker-rolling   Walk 50 feet activity   Assist    Assist level: Moderate Assistance - Patient - 50 - 74% Assistive device: Walker-rolling    Walk 150 feet activity   Assist Walk 150 feet activity did not occur: Safety/medical concerns         Walk 10 feet on uneven surface  activity   Assist Walk 10  feet on uneven surfaces activity did not occur: Safety/medical concerns         Wheelchair     Assist Is the patient using a wheelchair?: Yes Type of Wheelchair: Manual    Wheelchair assist level: Supervision/Verbal cueing Max wheelchair distance: 200'    Wheelchair 50 feet with 2 turns activity    Assist        Assist Level: Supervision/Verbal cueing   Wheelchair 150 feet activity     Assist      Assist Level: Supervision/Verbal cueing   Blood pressure (!) 141/74, pulse 65, temperature 97.6 F (36.4 C), temperature source Oral, resp. rate 16, height 6' 1 (1.854 m), weight 85.2 kg, SpO2 97%.   Medical Problem List and Plan: 1. Functional deficits secondary to right thalamic infarct, small SAH post CABG             -he has made nice neurological gains, but still with sensory/propioceptive deficits and ataxia.              -patient may shower             -ELOS/Goals: 5-7 days, mod I to supervision   - Stable to continue inpatient rehab  - 8/28: Reports of increased numbness/tingling through his left arm and face late this morning after evaluation; repeat CT head unchanged.  Neurology consult appreciated , no new infarct  Symptoms c/w R thalamic infarct previously diagnosed   - Expected discharge 12/11/2023 2.  Antithrombotics: -DVT/anticoagulation:  Mechanical: Sequential compression devices, below knee Bilateral lower extremities             -antiplatelet therapy: Aspirin  81 mg (hx of bleeding on plavix )   3. Pain Management: Arthritis bilat hands and ankles: request home med nabumetone  750 mg---will resume   - No complaints of pain 8-27  - 9/1 reports continued left shoulder pain, reports chronic.  Patient says this is under control and does not want any changes made at this time  -9/2 pain overall controlled, continue current regimen  9/3 Voltaren  gel for left shoulder pain, patient agreeable to try this  9/4 left shoulder pain improved with  Voltaren   9/5 could consider gabapentin left arm pain may be neuropathic, although he does report having this before CVA.  Patient does not want any additional oral medications at this time.  Currently area is not particular painful.  4. Mood/Behavior/Sleep: LCSW to follow for evaluation and support when available.              -antipsychotic agents:  Does not want Buspar  makes me feel horrible --discontinue as needed.  Denies need of anything for anxiety.             -  Insomnia: Requesting home Temazepam  15 mg for insomnia-ordered --using as needed, with significant benefit             5. Neuropsych/cognition: This patient is capable of making decisions on his own behalf. 6. Skin/Wound Care: routine pressure relief measures  7. Fluids/Electrolytes/Nutrition: monitor I&O and follow up chemistries              -Heart healthy diet   8. Hereditary angioedema: Patient supplied med (icatibant ) is in pharmacy fridge-patient had 1 episode of angioedema on 8/25 which was successfully treated with icatibant  PRN.              -uses takhzyro  inj sq every 2 weeks at home   - 8/28: Family brought Takhzyro  form home - reviewed med data sheet, no contraindications with stroke or interactions with current meds - ordered for every other Friday (pharmacy will need to confirm he did not get it day of admission 8/22)  9. CAD s/p stenting HLD: LDL 106 on Pitavastatin  4 mg daily and Zetia               -Start Leqvio as outpatient therapy             -see meds below for PAF/HTN/CHF             -nitro prn CP  10. HTN/CHF: norvasc  and losartan               -bp goal of less than 180/105   - Blood pressure well-controlled  8-28: Blood pressure soft overnight and this a.m.; will get orthostatic vitals.  Will get daily weights for CHF.  8/29 BP stable continue current regimen and monitor/follow asked nursing to check weights, no signs of overload  9/4-5 fair control, continue to monitor trend    12/07/2023    5:46 AM  12/06/2023    8:29 PM 12/06/2023    7:42 PM  Vitals with BMI  Weight 187 lbs 13 oz    BMI 24.79    Systolic 141 120 879  Diastolic 74 61 61  Pulse 65 64 64    Filed Weights   12/05/23 0516 12/06/23 0537 12/07/23 0546  Weight: 82.4 kg 84.6 kg 85.2 kg    11. T2DM: A1c 5.6    - Blood sugars well-controlled on current regimen  -9/2-5 Cbgs well controlled, continue current regimen  Recent Labs    12/06/23 2022 12/07/23 0605 12/07/23 1212  GLUCAP 144* 116* 102*     12. Post op PAF after CABG             -HR now controlled, regular             -metoprolol  xl 25mg  daily,  13. CKD IIIb. Cr has been stable around  1.8   - Creatinine 1.77, stable.  Monitor . - 9/1 BUN/creatinine overall higher today at 1.97/36  -9/3 improved creatinine to 1.71/BUN 33 Recheck Monday 14. Hx of prostate cancer/hematuria 15. Hx hypothyroid--levothyroxine   16. Hx of interstitial lung disease/allergies/OSA   - Sleep study performed downstairs, started on CPAP             -albuterol  prn             -flonase  and azelastine  nasal sprays bid   - Add petroleum jelly to nares as needed before placing nasal pillow--ordered standing 8-28  - 8-28: Patient reports that final sleep study stated that he does not need CPAP.  Bedside educational manual seems to just be general information  on stroke, but patient is insistent that he cannot tolerate it and will not use it for now on.  17. Constipation. Home dulcolax 15 mg daily ordered per patient request.   - 9/1 LBM 8/29, add MiraLAX , sorbitol  as needed  -9/4 chart review indicates he had bowel movement today, he reports he took Dulcolax last night  -9/5 LBM today control LOS: 10 days A FACE TO FACE EVALUATION WAS PERFORMED  Murray Collier 12/07/2023, 2:39 PM

## 2023-12-07 NOTE — Progress Notes (Signed)
 Occupational Therapy Session Note  Patient Details  Name: Daniel Herrera MRN: [id number] Date of Birth: 1940/06/02  Today's Date: 12/07/2023 OT Individual Time: 9083-8984 OT Individual Time Calculation (min): 59 min  and Today's Date: 12/07/2023 OT Missed Time: 15 Minutes Missed Time Reason:  (Missed minutes due to delay in care.)  Today's Date: 12/07/2023 OT Individual Time: 1420-1530 OT Individual Time Calculation (min): 70 min   Short Term Goals: Week 2:  OT Short Term Goal 1 (Week 2): STGs=LTGs due to patient's estimated length of stay.  Skilled Therapeutic Interventions/Progress Updates:   Session 1:  Pt greeted sitting in WC, reports of L-shoulder discomfort, pre-medicated, rest provided as needed. Pt ambulates from room<>day room with CGA progressing to close supervision + RW, intermediate cuing for attention to LLE for increase step height. In day room, pt instructed in series of LLE coordination activities to minimize fall risk during ADL participation/functional transfers. Pt demon's improvements in gross coordination and tolerance of WB/weight-shift onto limb. Pt also participates in table-top activity focused on static standing at midline, while performing coordination activity with LUE. Pt toilets in public rest-room with distant supervision + RW. Pt remained sitting in WC with all immediate needs met.   Session 2:  Pt greeted sitting in [facility] for skilled OT session with focus on functional mobility and dynamic standing balance. Pt with continued reports of L-shoulder discomfort, moist heat provided, KT tape discussed but patient reports increased sensitivty to tape, therefore intervention not attempted. Pt ambulates from room<>main therapy gym with close supervision, intermediate CGA with episodes of toe catching. In main gym, pt instructed in functional mobility task with emphasis placed on functional reaching towards below waist-level heights, pt requires education for safest  placement of RW when reaching outside BOS. CGA provided throughout. Pt issued RW bag for item transportation upon discharge. Pt then completes 3x32min reps of ball toss onto ball rebounder, standing without AD, all for dynamic balance, gross coordination, and reaction time challenge. Min A provided overall. Similar activity completed targeting LUE only, as patient is tasked with grasping cubes off of a rotating surface. Pt remained sitting in recliner with all immediate needs met, call bell within reach.   Therapy Documentation Precautions:  Precautions Precautions: Fall Recall of Precautions/Restrictions: Impaired Restrictions Weight Bearing Restrictions Per Provider Order: No   Therapy/Group: Individual Therapy  Nereida Habermann, OTR/L, MSOT  12/07/2023, 7:34 AM

## 2023-12-08 LAB — GLUCOSE, CAPILLARY
Glucose-Capillary: 98 mg/dL (ref 70–99)
Glucose-Capillary: 191 mg/dL — ABNORMAL HIGH (ref 70–99)
Glucose-Capillary: 133 mg/dL — ABNORMAL HIGH (ref 70–99)
Glucose-Capillary: 168 mg/dL — ABNORMAL HIGH (ref 70–99)

## 2023-12-08 NOTE — Progress Notes (Signed)
   12/08/23 1945  What Happened  Was fall witnessed? Yes  Who witnessed fall? warren Elbe  Patients activity before fall other (comment) (sitting in wheelchair, taking meds)  Point of contact buttocks  Was patient injured? No  Provider Notification  Provider Name/Title Jereld Street  Date Provider Notified 12/08/23  Time Provider Notified 2005  Method of Notification Page (secure chat)  Notification Reason Fall  Provider response No new orders  Date of Provider Response 12/08/23  Time of Provider Response 2005  Follow Up  Family notified No - patient refusal (patient do not call my daughter.)  Additional tests No  Neurological  Neuro (WDL) X  Level of Consciousness Alert  Orientation Level Oriented X4  Cognition Poor safety awareness;Follows commands  Speech Clear  Motor Function/Sensation Assessment Grip  R Hand Grip Moderate  L Hand Grip Moderate  R Foot Dorsiflexion Moderate  L Foot Dorsiflexion Moderate  R Foot Plantar Flexion Moderate  L Foot Plantar Flexion Moderate  RUE Motor Response Purposeful movement  LUE Motor Response Purposeful movement  RLE Motor Response Purposeful movement  LLE Motor Response Purposeful movement  Neuro Symptoms None  Musculoskeletal  Musculoskeletal (WDL) X  Assistive Device Front wheel walker;Wheelchair  Generalized Weakness Yes  Weight Bearing Restrictions Per Provider Order No  Musculoskeletal Details  LUE Weakness  LLE Weakness  Integumentary  Integumentary (WDL) X  Skin Color Appropriate for ethnicity  Skin Condition Dry  Skin Integrity Ecchymosis  Abrasion Location Arm  Abrasion Location Orientation Left  Ecchymosis Location Arm  Ecchymosis Location Orientation Bilateral

## 2023-12-09 DIAGNOSIS — D841 Defects in the complement system: Secondary | ICD-10-CM

## 2023-12-09 DIAGNOSIS — E118 Type 2 diabetes mellitus with unspecified complications: Secondary | ICD-10-CM

## 2023-12-09 LAB — GLUCOSE, CAPILLARY
Glucose-Capillary: 117 mg/dL — ABNORMAL HIGH (ref 70–99)
Glucose-Capillary: 118 mg/dL — ABNORMAL HIGH (ref 70–99)
Glucose-Capillary: 129 mg/dL — ABNORMAL HIGH (ref 70–99)
Glucose-Capillary: 160 mg/dL — ABNORMAL HIGH (ref 70–99)

## 2023-12-09 MED ORDER — PNEUMOCOCCAL 20-VAL CONJ VACC 0.5 ML IM SUSY
0.5000 mL | PREFILLED_SYRINGE | INTRAMUSCULAR | Status: DC
  Filled 2023-12-09: qty 0.5

## 2023-12-09 MED ORDER — INFLUENZA VAC SPLIT HIGH-DOSE 0.5 ML IM SUSY
0.5000 mL | PREFILLED_SYRINGE | INTRAMUSCULAR | Status: DC
  Filled 2023-12-09: qty 0.5

## 2023-12-09 NOTE — Progress Notes (Signed)
 Occupational Therapy Session Note  Patient Details  Name: Daniel Herrera MRN: [id number] Date of Birth: Nov 01, 1940  Today's Date: 12/09/2023 OT Individual Time: 1100-1205 OT Individual Time Calculation (min): 65 min    Short Term Goals: Week 1:  OT Short Term Goal 1 (Week 1): STGs=LTGs due to patient's estimated length of stay. OT Short Term Goal 1 - Progress (Week 1): Progressing toward goal  Skilled Therapeutic Interventions/Progress Updates:  Patient seated at w/c LOF at the time of arrival. Patient indicated that he needed to go to the restroom. Patient was able to transport himself to the restroom with  close S and was able to transfer  from the w/c to the commode using grab bar and arm of wc  with close S.  The pt was able to manage LB clothing items underwear and shorts in stand incorporating the arm of the 3n 1 and the grab bars with close S . The NT came in to monitor the pt's blood sugar levels, shortly thereafter, the pt was able to come from sit to stand using the rollator for coming into stand at [facility] for a IADL related task in changing the linen from his bed. The pt was able to stand incorporating the rollator for removing the sheet from the bed with MinA.  The pt was able to transition to sit using the rollator for placement at close S. The pt was able to remove and change the pillow cases with s/uA   The pt was able to transition to standing for donning the fitted sheet onto the bed with ModA.  The pt was able to return to sit and was instructed to incorporate relaxation breathing for greater compliance. The pt was able to come back into standing using the rollator  to remove the linen from his bed and replace it while in standing. The pt was able to donn the top sheets and bed covers with MinA for managing the covers.   Upon completing the IADL related task in making his bed, the pt went on to complete UB exercises using a 2lb dumb bell 1 set of 10 for bicep curls, shld flexion,  horizontal abduction , elbow extension, and lifts with rest breaks as needed, the pt required 3 rest breaks and was encouraged to incorporated relaxation breathing for > compliance. At the end of the session the pt remained at w/c LOF with all additional needs addressed prior to me exiting the room.    Therapy Documentation Precautions:  Precautions Precautions: Fall Recall of Precautions/Restrictions: Impaired Restrictions Weight Bearing Restrictions Per Provider Order: No   Therapy/Group: Individual Therapy  Elvera JONETTA Mace 12/09/2023, 12:17 PM

## 2023-12-09 NOTE — Progress Notes (Addendum)
 PROGRESS NOTE   Subjective/Complaints: Pt with a fall last night from wheelchair. Landed on butt. No obvious sequelae.   ROS: Patient denies fever, rash, sore throat, blurred vision, dizziness, nausea, vomiting, diarrhea, cough, shortness of breath or chest pain, joint or back/neck pain, headache, or mood change.    Objective:   No results found.  No results for input(s): WBC, HGB, HCT, PLT in the last 72 hours.   No results for input(s): NA, K, CL, CO2, GLUCOSE, BUN, CREATININE, CALCIUM  in the last 72 hours.    Intake/Output Summary (Last 24 hours) at 12/09/2023 0800 Last data filed at 12/09/2023 0513 Gross per 24 hour  Intake 742 ml  Output 300 ml  Net 442 ml        Physical Exam: Vital Signs Blood pressure 133/67, pulse 73, temperature 98.4 F (36.9 C), temperature source Oral, resp. rate 16, height 6' 1 (1.854 m), weight 85 kg, SpO2 98%.  Constitutional: No distress . Vital signs reviewed. HEENT: NCAT, EOMI, oral membranes moist Neck: supple Cardiovascular: RRR without murmur. No JVD    Respiratory/Chest: CTA Bilaterally without wheezes or rales. Normal effort    GI/Abdomen: BS +, non-tender, non-distended Ext: no clubbing, cyanosis, or edema Psych: pleasant and cooperative  Skin: No evidence of breakdown, no evidence of rash Neurologic: Cranial nerves II through XII grossly intact, motor strength is 5/5 in right and 4/5 left deltoid, bicep, tricep, grip, hip flexor, knee extensors, ankle dorsiflexor and plantar flexor Sensory exam normal sensation to light touch and proprioception in bilateral upper and lower extremities Cerebellar exam mild ataxia L FNF   Prior neuro assessment is c/w today's exam 12/09/2023.   Musculoskeletal: Full range of motion in all 4 extremities. No joint swelling.  No significant TTP left shoulder today, no significant pain with ROM       Assessment/Plan: 1. Functional deficits which require 3+ hours per day of interdisciplinary therapy in a comprehensive inpatient rehab setting. Physiatrist is providing close team supervision and 24 hour management of active medical problems listed below. Physiatrist and rehab team continue to assess barriers to discharge/monitor patient progress toward functional and medical goals  Care Tool:  Bathing    Body parts bathed by patient: Right arm, Chest, Left arm, Abdomen, Front perineal area, Buttocks, Right upper leg, Left upper leg, Right lower leg, Left lower leg, Face         Bathing assist Assist Level: Minimal Assistance - Patient > 75% (For balance)     Upper Body Dressing/Undressing Upper body dressing   What is the patient wearing?: Pull over shirt    Upper body assist Assist Level: Set up assist    Lower Body Dressing/Undressing Lower body dressing      What is the patient wearing?: Pants, Underwear/pull up     Lower body assist Assist for lower body dressing: Minimal Assistance - Patient > 75%     Toileting Toileting    Toileting assist Assist for toileting: Minimal Assistance - Patient > 75%     Transfers Chair/bed transfer  Transfers assist     Chair/bed transfer assist level: Minimal Assistance - Patient > 75%     Locomotion Ambulation  Ambulation assist      Assist level: Moderate Assistance - Patient 50 - 74% Assistive device: Walker-rolling Max distance: 60'   Walk 10 feet activity   Assist     Assist level: Minimal Assistance - Patient > 75% Assistive device: Walker-rolling   Walk 50 feet activity   Assist    Assist level: Moderate Assistance - Patient - 50 - 74% Assistive device: Walker-rolling    Walk 150 feet activity   Assist Walk 150 feet activity did not occur: Safety/medical concerns         Walk 10 feet on uneven surface  activity   Assist Walk 10 feet on uneven surfaces activity did not occur:  Safety/medical concerns         Wheelchair     Assist Is the patient using a wheelchair?: Yes Type of Wheelchair: Manual    Wheelchair assist level: Supervision/Verbal cueing Max wheelchair distance: 200'    Wheelchair 50 feet with 2 turns activity    Assist        Assist Level: Supervision/Verbal cueing   Wheelchair 150 feet activity     Assist      Assist Level: Supervision/Verbal cueing   Blood pressure 133/67, pulse 73, temperature 98.4 F (36.9 C), temperature source Oral, resp. rate 16, height 6' 1 (1.854 m), weight 85 kg, SpO2 98%.   Medical Problem List and Plan: 1. Functional deficits secondary to right thalamic infarct, small SAH post CABG             -he has made nice neurological gains, but still with sensory/propioceptive deficits and ataxia.              -patient may shower             -ELOS/Goals: 5-7 days, mod I to supervision   - Stable to continue inpatient rehab  - 8/28: Reports of increased numbness/tingling through his left arm and face late this morning after evaluation; repeat CT head unchanged.  Neurology consult appreciated , no new infarct  Symptoms c/w R thalamic infarct previously diagnosed   - Expected discharge 12/11/2023  --Continue CIR therapies including PT, OT  2.  Antithrombotics: -DVT/anticoagulation:  Mechanical: Sequential compression devices, below knee Bilateral lower extremities             -antiplatelet therapy: Aspirin  81 mg (hx of bleeding on plavix )   3. Pain Management: Arthritis bilat hands and ankles: request home med nabumetone  750 mg---will resume   - No complaints of pain 8-27  - 9/1 reports continued left shoulder pain, reports chronic.  Patient says this is under control and does not want any changes made at this time  -9/2 pain overall controlled, continue current regimen  9/3 Voltaren  gel for left shoulder pain, patient agreeable to try this  9/4 left shoulder pain improved with Voltaren   9/7 left  arm pain seems to be under reasonable control at present  4. Mood/Behavior/Sleep: LCSW to follow for evaluation and support when available.              -antipsychotic agents:  Does not want Buspar  makes me feel horrible --discontinue as needed.  Denies need of anything for anxiety.             -Insomnia: Requesting home Temazepam  15 mg for insomnia-ordered --using as needed, with significant benefit             5. Neuropsych/cognition: This patient is capable of making decisions on his own behalf. 6.  Skin/Wound Care: routine pressure relief measures  7. Fluids/Electrolytes/Nutrition: monitor I&O and follow up chemistries              -Heart healthy diet   8. Hereditary angioedema: Patient supplied med (icatibant ) is in pharmacy fridge-patient had 1 episode of angioedema on 8/25 which was successfully treated with icatibant  PRN.              -uses takhzyro  inj sq every 2 weeks at home   - 8/28: Family brought Takhzyro  form home - reviewed med data sheet, no contraindications with stroke or interactions with current meds - ordered for every other Friday (pharmacy will need to confirm he did not get it day of admission 8/22)  9. CAD s/p stenting HLD: LDL 106 on Pitavastatin  4 mg daily and Zetia               -Start Leqvio as outpatient therapy             -see meds below for PAF/HTN/CHF             -nitro prn CP  10. HTN/CHF: norvasc  and losartan               -bp goal of less than 180/105   - Blood pressure well-controlled  8-28: Blood pressure soft overnight and this a.m.; will get orthostatic vitals.  Will get daily weights for CHF.  8/29 BP stable continue current regimen and monitor/follow asked nursing to check weights, no signs of overload  9/7 bp controlled with current meds    12/09/2023    5:17 AM 12/09/2023    5:12 AM 12/08/2023   10:08 PM  Vitals with BMI  Weight 187 lbs 6 oz    BMI 24.73    Systolic  133 132  Diastolic  67 67  Pulse  73 73    Filed Weights   12/07/23 0546  12/08/23 0500 12/09/23 0517  Weight: 85.2 kg 85.1 kg 85 kg    11. T2DM: A1c 5.6    - Blood sugars well-controlled on current regimen  -9/7 cbg under reasonable control at present Recent Labs    12/08/23 1644 12/08/23 2055 12/09/23 0534  GLUCAP 133* 168* 117*     12. Post op PAF after CABG             -HR now controlled, regular             -metoprolol  xl 25mg  daily,  13. CKD IIIb. Cr has been stable around  1.8   - Creatinine 1.77, stable.  Monitor . - 9/1 BUN/creatinine overall higher today at 1.97/36  -9/3 improved creatinine to 1.71/BUN 33 Recheck Monday 14. Hx of prostate cancer/hematuria 15. Hx hypothyroid--levothyroxine   16. Hx of interstitial lung disease/allergies/OSA   - Sleep study performed downstairs, started on CPAP             -albuterol  prn             -flonase  and azelastine  nasal sprays bid   - Add petroleum jelly to nares as needed before placing nasal pillow--ordered standing 8-28  - 8-28: Patient reports that final sleep study stated that he does not need CPAP.  Bedside educational manual seems to just be general information on stroke, but patient is insistent that he cannot tolerate it and will not use it for now on.  17. Constipation. Home dulcolax 15 mg daily ordered per patient request.   - 9/1 LBM 8/29, add MiraLAX , sorbitol  as needed  -  9/4 chart review indicates he had bowel movement today, he reports he took Dulcolax last night  -9/6 LBM     LOS: 12 days A FACE TO FACE EVALUATION WAS PERFORMED  Arthea ONEIDA Gunther 12/09/2023, 8:00 AM

## 2023-12-10 ENCOUNTER — Other Ambulatory Visit (HOSPITAL_COMMUNITY): Payer: Self-pay

## 2023-12-10 LAB — BASIC METABOLIC PANEL WITH GFR
Anion gap: 14 (ref 5–15)
BUN: 30 mg/dL — ABNORMAL HIGH (ref 8–23)
CO2: 20 mmol/L — ABNORMAL LOW (ref 22–32)
Calcium: 8.9 mg/dL (ref 8.9–10.3)
Chloride: 104 mmol/L (ref 98–111)
Creatinine, Ser: 2.26 mg/dL — ABNORMAL HIGH (ref 0.61–1.24)
GFR, Estimated: 28 mL/min — ABNORMAL LOW (ref >60)
Glucose, Bld: 111 mg/dL — ABNORMAL HIGH (ref 70–99)
Potassium: 4.2 mmol/L (ref 3.5–5.1)
Sodium: 138 mmol/L (ref 135–145)

## 2023-12-10 LAB — CBC
HCT: 35.2 % — ABNORMAL LOW (ref 39.0–52.0)
Hemoglobin: 12.3 g/dL — ABNORMAL LOW (ref 13.0–17.0)
MCH: 31.8 pg (ref 26.0–34.0)
MCHC: 34.9 g/dL (ref 30.0–36.0)
MCV: 91 fL (ref 80.0–100.0)
Platelets: 174 K/uL (ref 150–400)
RBC: 3.87 MIL/uL — ABNORMAL LOW (ref 4.22–5.81)
RDW: 12.3 % (ref 11.5–15.5)
WBC: 6.3 K/uL (ref 4.0–10.5)
nRBC: 0 % (ref 0.0–0.2)

## 2023-12-10 LAB — GLUCOSE, CAPILLARY
Glucose-Capillary: 125 mg/dL — ABNORMAL HIGH (ref 70–99)
Glucose-Capillary: 124 mg/dL — ABNORMAL HIGH (ref 70–99)

## 2023-12-10 MED ORDER — METOPROLOL SUCCINATE ER 25 MG PO TB24
25.0000 mg | ORAL_TABLET | Freq: Two times a day (BID) | ORAL | 0 refills | Status: DC
  Filled 2023-12-10: qty 60, 30d supply, fill #0

## 2023-12-10 MED ORDER — PANTOPRAZOLE SODIUM 40 MG PO TBEC
40.0000 mg | DELAYED_RELEASE_TABLET | Freq: Two times a day (BID) | ORAL | 0 refills | Status: AC
  Filled 2023-12-10: qty 60, 30d supply, fill #0

## 2023-12-10 NOTE — Discharge Instructions (Addendum)
 Inpatient Rehab Discharge Instructions  MILIND RAETHER Discharge date and time: 12/10/23  Activities/Precautions/ Functional Status: Activity: no lifting, driving, or strenuous exercise for until cleared provider  Diet: cardiac diet Wound Care: none needed Functional status:  ___ No restrictions     ___ Walk up steps independently ___ 24/7 supervision/assistance   ___ Walk up steps with assistance ___ Intermittent supervision/assistance  ___ Bathe/dress independently ___ Walk with walker     ___ Bathe/dress with assistance ___ Walk Independently    ___ Shower independently ___ Walk with assistance    ___ Shower with assistance _X__ No alcohol      ___ Return to work/school ________  Special Instructions:  You need to drink plenty of fluids    COMMUNITY REFERRALS UPON DISCHARGE:    Home Health:   PT      OT         SNA                 Agency:[facility]  Phone:[phone]  *Please expect follow-up within 2-3 business days for discharge to schedule your home visit. If you have not received follow-up, be sure to contact the site directly.*    Medical Equipment/Items Ordered:rolling walker                                                 Agency/Supplier: [facility] [phone]     My questions have been answered and I understand these instructions. I will adhere to these goals and the provided educational materials after my discharge from the hospital.  Patient/Caregiver Signature _______________________________ Date __________  Clinician Signature _______________________________________ Date __________  Please bring this form and your medication list with you to all your follow-up doctor's appointments.

## 2023-12-10 NOTE — Plan of Care (Signed)
  Problem: RH Balance Goal: LTG Patient will maintain dynamic standing with ADLs (OT) Description: LTG:  Patient will maintain dynamic standing balance with assist during activities of daily living (OT)  Outcome: Completed/Met   Problem: RH Bathing Goal: LTG Patient will bathe all body parts with assist levels (OT) Description: LTG: Patient will bathe all body parts with assist levels (OT) Outcome: Completed/Met   Problem: RH Dressing Goal: LTG Patient will perform upper body dressing (OT) Description: LTG Patient will perform upper body dressing with assist, with/without cues (OT). Outcome: Completed/Met Goal: LTG Patient will perform lower body dressing w/assist (OT) Description: LTG: Patient will perform lower body dressing with assist, with/without cues in positioning using equipment (OT) Outcome: Completed/Met   Problem: RH Toileting Goal: LTG Patient will perform toileting task (3/3 steps) with assistance level (OT) Description: LTG: Patient will perform toileting task (3/3 steps) with assistance level (OT)  Outcome: Completed/Met   Problem: RH Toilet Transfers Goal: LTG Patient will perform toilet transfers w/assist (OT) Description: LTG: Patient will perform toilet transfers with assist, with/without cues using equipment (OT) Outcome: Completed/Met   Problem: RH Tub/Shower Transfers Goal: LTG Patient will perform tub/shower transfers w/assist (OT) Description: LTG: Patient will perform tub/shower transfers with assist, with/without cues using equipment (OT) Outcome: Completed/Met

## 2023-12-10 NOTE — Progress Notes (Signed)
 Occupational Therapy Discharge Summary  Patient Details  Name: PJ ZEHNER MRN: [id number] Date of Birth: [birth year]  Date of Discharge from OT service:December 10, 2023   Patient has met 7 of 7 long term goals due to improved activity tolerance, improved balance, postural control, ability to compensate for deficits, functional use of  LEFT upper and LEFT lower extremity, improved attention, improved awareness, and improved coordination.  Patient to discharge at overall Supervision level.  Patient's care partner is independent to provide the necessary cognitive assistance at discharge.    Reasons goals not met: NA  Recommendation:  Patient will benefit from ongoing skilled OT services in home health setting to continue to advance functional skills in the area of BADL, iADL, and Reduce care partner burden.  Equipment: RW  Reasons for discharge: treatment goals met and discharge from hospital  Patient/family agrees with progress made and goals achieved: Yes  OT Discharge Precautions/Restrictions  Precautions Precautions: Fall Recall of Precautions/Restrictions: Impaired Precaution/Restrictions Comments: Decreased safety awareness & mild impulsivity Restrictions Weight Bearing Restrictions Per Provider Order: No ADL ADL Eating: Modified independent Where Assessed-Eating: Wheelchair Grooming: Modified independent Where Assessed-Grooming: Sitting at sink, Wheelchair Upper Body Bathing: Modified independent Where Assessed-Upper Body Bathing: Shower Lower Body Bathing: Supervision/safety Where Assessed-Lower Body Bathing: Shower Upper Body Dressing: Modified independent (Device) Where Assessed-Upper Body Dressing: Wheelchair Lower Body Dressing: Supervision/safety Where Assessed-Lower Body Dressing: Wheelchair Toileting: Supervision/safety Where Assessed-Toileting: Toilet, Psychiatrist Transfer: Close supervision Toilet Transfer Method: Public house manager: Acupuncturist: Close supervision Film/video editor Method: Designer, industrial/product: Sales promotion account executive Baseline Vision/History: 1 Wears glasses Patient Visual Report: No change from baseline Vision Assessment?: Wears glasses for reading;Wears glasses for driving Perception  Perception: Impaired Perception-Other Comments: Mild L-sided inattention Praxis Praxis: Impaired Praxis-Other Comments: Ataxia Cognition Cognition Overall Cognitive Status: Within Functional Limits for tasks assessed Arousal/Alertness: Awake/alert Memory: Impaired Memory Impairment: Decreased short term memory Safety/Judgment: Impaired Comments: Decreased safety awareness & mild impulsivity Brief Interview for Mental Status (BIMS) Repetition of Three Words (First Attempt): 3 Temporal Orientation: Year: Correct Temporal Orientation: Month: Missed by 6 days to 1 month Temporal Orientation: Day: Correct Recall: Sock: Yes, no cue required Recall: Blue: Yes, no cue required Recall: Bed: Yes, no cue required BIMS Summary Score: 14 Sensation Sensation Light Touch: Impaired by gross assessment Hot/Cold: Appears Intact Proprioception: Impaired by gross assessment Additional Comments: Reports numbness in L shoulder, improved since evaluation. Decreased proprioception in L-side. Coordination Gross Motor Movements are Fluid and Coordinated: No Fine Motor Movements are Fluid and Coordinated: No Coordination and Movement Description: L-hemi and ataxia 9 Hole Peg Test: R= ~24 secs & L= ~49 secs Motor  Motor Motor: Ataxia;Hemiplegia Motor - Discharge Observations: L hemi with coordination deficits, improved from eval Mobility  Transfers Sit to Stand: Supervision/Verbal cueing Stand to Sit: Supervision/Verbal cueing  Trunk/Postural Assessment  Cervical Assessment Cervical Assessment: Exceptions to [facility] (forward head) Thoracic  Assessment Thoracic Assessment: Exceptions to [facility] (rounded shoulder) Lumbar Assessment Lumbar Assessment: Exceptions to [facility] (posterior pelvic tilt) Postural Control Postural Control: Deficits on evaluation Righting Reactions: delayed and inadequate Protective Responses: decreased Postural Limitations: decreased  Balance Balance Balance Assessed: Yes Static Sitting Balance Static Sitting - Balance Support: Feet supported Static Sitting - Level of Assistance: 6: Modified independent (Device/Increase time) Dynamic Sitting Balance Dynamic Sitting - Balance Support: Feet supported Dynamic Sitting - Level of Assistance: 6: Modified independent (Device/Increase time) Static Standing Balance Static Standing - Balance Support: During functional  activity;Bilateral upper extremity supported Static Standing - Level of Assistance: 5: Stand by assistance (SUP) Dynamic Standing Balance Dynamic Standing - Balance Support: During functional activity;Bilateral upper extremity supported Dynamic Standing - Level of Assistance: 5: Stand by assistance (SUP) Extremity/Trunk Assessment RUE Assessment RUE Assessment: Within Functional Limits General Strength Comments: ~30# of grip strength LUE Assessment LUE Assessment: Exceptions to [facility] Active Range of Motion (AROM) Comments: WFL but painful due to prior shoulder injury General Strength Comments: 4-/5 proximally; dysmetria/ataxia, improved since evaluation; ~27# of grip strength LUE Body System: Neuro Brunstrum levels for arm and hand: Arm;Hand Brunstrum level for arm: Stage V Relative Independence from Synergy Brunstrum level for hand: Stage VI Isolated joint movements   Nereida Habermann, OTR/L, MSOT  12/10/2023, 8:13 AM

## 2023-12-10 NOTE — Progress Notes (Signed)
 Physical Therapy Discharge Summary  Patient Details  Name: Daniel Herrera MRN: [id number] Date of Birth: November 03, 1940  Date of Discharge from PT service:December 10, 2023  Today's Date: 12/10/2023 PT Individual Time: 0950-1031 PT Individual Time Calculation (min): 41 min    Patient has met 7 of 7 long term goals due to improved activity tolerance, improved balance, improved postural control, increased strength, functional use of  left upper extremity and left lower extremity, improved attention, improved awareness, and improved coordination.  Patient to discharge at an ambulatory level Supervision.   Patient's care partner is independent to provide the necessary physical assistance at discharge.  Reasons goals not met: N/A  Recommendation:  Patient will benefit from ongoing skilled PT services in home health setting to continue to advance safe functional mobility, address ongoing impairments in gait mechanics, L hemi coordination, dynamic standing balance, and minimize fall risk.  Equipment: RW  Reasons for discharge: treatment goals met and discharge from hospital  Patient/family agrees with progress made and goals achieved: Yes  PT Discharge Skilled Treatment Interventions:  Pt presents in room in [facility], agreeable to PT. Pt denies pain at this time. Session focused on therapeutic activities with emphasis on DC planning and education on safety at DC. Pt completes transfers with supervision throughout session with RW. Pt ambulates with RW on unit, from room to main gym, main gym to ortho gym, and from ortho gym back to room with longest distance ~300' with supervision with RW, does require occasional cues for increasing proximity to RW, LLE positioning with turning and for LLE foot clearance. Pt completes up/down 12 steps with BHRs with supervision, cues for LLE positioning, completes step to gait pattern ascending leading with LLE, descending leading with LLE. Pt completes car transfer with  supervision with RW with cues for sitting prior to bringing BLEs into car. Pt ambulates up/down ramp with RW with supervision. Pt completes TUG in 32.02 seconds, improved safety awareness and management of speed however does demonstrate increased risk for falls. Pt returns to room and remains seated in [facility] with all needs within reach, cal light in place and chair alarm donned and activated at end of session.   Precautions/Restrictions Precautions Precautions: Fall Recall of Precautions/Restrictions: Impaired Precaution/Restrictions Comments: Decreased safety awareness & mild impulsivity Restrictions Weight Bearing Restrictions Per Provider Order: No Pain Interference Pain Interference Pain Effect on Sleep: 1. Rarely or not at all Pain Interference with Therapy Activities: 1. Rarely or not at all Pain Interference with Day-to-Day Activities: 1. Rarely or not at all Vision/Perception  Vision - History Ability to See in Adequate Light: 0 Adequate Perception Perception: Impaired Preception Impairment Details: Inattention/Neglect Perception-Other Comments: Mild L-sided inattention Praxis Praxis: Impaired Praxis-Other Comments: Ataxia  Cognition Overall Cognitive Status: Within Functional Limits for tasks assessed Arousal/Alertness: Awake/alert Orientation Level: Oriented X4 Sensation Sensation Light Touch: Impaired by gross assessment Hot/Cold: Appears Intact Proprioception: Appears Intact Additional Comments: reports some numbness in L shoulder but improved significantly from eval Coordination Gross Motor Movements are Fluid and Coordinated: No Fine Motor Movements are Fluid and Coordinated: No Coordination and Movement Description: Ataxia and L hemi Motor  Motor Motor: Ataxia;Hemiplegia Motor - Discharge Observations: L hemi with coordination deficits, improved from eval  Mobility Bed Mobility Bed Mobility: Supine to Sit;Sit to Supine Supine to Sit: Independent with  assistive device Sit to Supine: Independent with assistive device Transfers Transfers: Sit to Stand;Stand to Sit;Stand Pivot Transfers Sit to Stand: Supervision/Verbal cueing Stand to Sit: Supervision/Verbal cueing Stand Pivot Transfers:  Supervision/Verbal cueing Stand Pivot Transfer Details: Verbal cues for technique;Verbal cues for precautions/safety;Verbal cues for safe use of DME/AE Transfer (Assistive device): Rolling walker Locomotion  Gait Ambulation: Yes Gait Assistance: Supervision/Verbal cueing Gait Distance (Feet): 250 Feet Assistive device: Rolling walker Gait Assistance Details: Verbal cues for sequencing;Verbal cues for safe use of DME/AE;Verbal cues for gait pattern;Verbal cues for precautions/safety;Verbal cues for technique;Visual cues/gestures for sequencing Gait Gait: Yes Gait Pattern: Impaired Gait Pattern: Ataxic;Poor foot clearance - left Gait velocity: decreased Stairs / Additional Locomotion Stairs: Yes Stairs Assistance: Supervision/Verbal cueing Stair Management Technique: Two rails Number of Stairs: 12 Height of Stairs: 6 Ramp: Supervision/Verbal cueing Pick up small object from the floor assist level: Supervision/Verbal cueing Pick up small object from the floor assistive device: RW Wheelchair Mobility Wheelchair Mobility: No (ambulates on unit)  Trunk/Postural Assessment  Cervical Assessment Cervical Assessment: Exceptions to [facility] (forward head) Thoracic Assessment Thoracic Assessment: Exceptions to [facility] (rounded shoulder) Lumbar Assessment Lumbar Assessment: Exceptions to [facility] (posterior pelvic tilt) Postural Control Postural Control: Deficits on evaluation Righting Reactions: delayed and inadequate Protective Responses: decreased Postural Limitations: decreased  Balance Balance Balance Assessed: Yes Standardized Balance Assessment Standardized Balance Assessment: Timed Up and Go Test Timed Up and Go Test TUG: Normal TUG Normal TUG  (seconds): 32.02 Static Sitting Balance Static Sitting - Balance Support: Feet supported Static Sitting - Level of Assistance: 6: Modified independent (Device/Increase time) Dynamic Sitting Balance Dynamic Sitting - Balance Support: Feet supported Dynamic Sitting - Level of Assistance: 6: Modified independent (Device/Increase time) Static Standing Balance Static Standing - Balance Support: During functional activity;Bilateral upper extremity supported Static Standing - Level of Assistance: 5: Stand by assistance Dynamic Standing Balance Dynamic Standing - Balance Support: During functional activity;Bilateral upper extremity supported Dynamic Standing - Level of Assistance: 5: Stand by assistance Extremity Assessment  RLE Assessment RLE Assessment: Within Functional Limits LLE Assessment LLE Assessment: Exceptions to [facility] General Strength Comments: grossly 4+/5   Reche Ohara PT, DPT 12/10/2023, 10:33 AM

## 2023-12-10 NOTE — Progress Notes (Signed)
 Patient ID: Daniel Herrera, male   DOB: January 17, 1941, 83 y.o.   MRN: [id number]  SW received updates that pt can d/c to home today due to his concerns reported about his son-in-law's work schedule.   SW met with pt in room to review discharge.  confirms RW in room.    1310- SW called pt dtr Daniel Herrera to confirm that pt will leave today after his therapy. Confirms late discharge after 5pm.   SW updated [facility] on changes.   Graeme Jude, MSW, LCSW Office: [phone] Cell: [phone] Fax: [phone]

## 2023-12-10 NOTE — Progress Notes (Signed)
 Patient ID: Daniel Herrera, male   DOB: 04-13-1940, 83 y.o.   MRN: [id number] Have ordered a rolling walker via [employer] for pt asking for delivery for today

## 2023-12-10 NOTE — Plan of Care (Signed)
   Problem: RH Balance Goal: LTG Patient will maintain dynamic standing balance (PT) Description: LTG:  Patient will maintain dynamic standing balance with assistance during mobility activities (PT) Outcome: Completed/Met   Problem: Sit to Stand Goal: LTG:  Patient will perform sit to stand with assistance level (PT) Description: LTG:  Patient will perform sit to stand with assistance level (PT) Outcome: Completed/Met   Problem: RH Bed to Chair Transfers Goal: LTG Patient will perform bed/chair transfers w/assist (PT) Description: LTG: Patient will perform bed to chair transfers with assistance (PT). Outcome: Completed/Met   Problem: RH Car Transfers Goal: LTG Patient will perform car transfers with assist (PT) Description: LTG: Patient will perform car transfers with assistance (PT). Outcome: Completed/Met   Problem: RH Ambulation Goal: LTG Patient will ambulate in controlled environment (PT) Description: LTG: Patient will ambulate in a controlled environment, # of feet with assistance (PT). Outcome: Completed/Met Goal: LTG Patient will ambulate in home environment (PT) Description: LTG: Patient will ambulate in home environment, # of feet with assistance (PT). Outcome: Completed/Met   Problem: RH Stairs Goal: LTG Patient will ambulate up and down stairs w/assist (PT) Description: LTG: Patient will ambulate up and down # of stairs with assistance (PT) Outcome: Completed/Met

## 2023-12-10 NOTE — Progress Notes (Signed)
 Occupational Therapy Session Note  Patient Details  Name: Daniel Herrera MRN: [id number] Date of Birth: September 17, 1940  Today's Date: 12/10/2023 OT Individual Time: 9194-9084 OT Individual Time Calculation (min): 70 min   Today's Date: 12/10/2023 OT Individual Time: 1348-1530 OT Individual Time Calculation (min): 102 min   Short Term Goals: Week 2:  OT Short Term Goal 1 (Week 2): STGs=LTGs due to patient's estimated length of stay.  Skilled Therapeutic Interventions/Progress Updates:   Session 1: Pt greeted completing sink-side grooming, skilled OT session with focus on functional mobility and LUE/LLE NMR.   Pain: Pt with continued L-shoulder discomfort,  OT offering intermediate rest breaks and positioning suggestions throughout session to address pain/fatigue and maximize participation/safety in session.   Functional Transfers: Sit<>stands and ambulating transfers with supervision + RW.   Self Care Tasks: Seated sink-side grooming with Mod I.   Therapeutic Activities: Functional mobility tasks completed with focus on side-steps and backward-steps for dynamic standing balance and BLE coordination. Pt requires 2.5# ankle weight to assist with proprioceptive input. Pt able to take side-steps alongside edge of table, progressing from one handed support to none, CGA-Min A provided with tacticle cues for upright posture and terminal hip extension. Backward-steps taken with BUE on RW for increased safety, CGA provided, increased cuing for step-height and safe RW proximity.   Pt then completes moderately difficult pegboard puzzle with LUE to address [facility], able to upgrade activity to use of tweezers.   Education: Re-educated on the importance of locking WC when reaching outside BOS, as LPN reports patient had an assisted lowering to floor over the weekend due to not implementing the above.   Pt remained sitting on toilet, NT present for handoff of care, 4Ps assessed and immediate needs met. Pt  continues to be appropriate for skilled OT intervention to promote further functional independence in ADLs/IADLs.    Session 2: Pt greeted sitting in recliner for skilled OT session with focus on functional transfers, general conditioning, and discharge planning.   Pain: Pt with no reports of pain. OT offering intermediate rest breaks and positioning suggestions throughout session to address pain/fatigue and maximize participation/safety in session.   Functional Transfers: Sit<>stands and ambulatory transfers at supervision level + RW. Simulated walk-in shower transfer at [facility] progressing to close supervision. Pt completes floor recovery successfully.   Self Care Tasks: 3/3 toileting tasks with supervision + use of grab bar.   Therapeutic Exercise: Pt instructed in series of BLE strengthening exercises for improved standing balance/decreased fall risk, details below: Mini Squat with Counter Support Heel Raises with Counter Support Standing March with Counter Support Standing Knee Flexion with Counter Support Side Stepping with Counter Support  Pt completes 2x10 reps of the above exercises, OT providing cuing for posture/form/pacing.   Pt remained sitting in WC with 4Ps assessed and immediate needs met. Pt continues to be appropriate for skilled OT intervention to promote further functional independence in ADLs/IADLs.   Therapy Documentation Precautions:  Precautions Precautions: Fall Recall of Precautions/Restrictions: Impaired Restrictions Weight Bearing Restrictions Per Provider Order: No   Therapy/Group: Individual Therapy  Nereida Habermann, OTR/L, MSOT  12/10/2023, 7:37 AM

## 2023-12-10 NOTE — Progress Notes (Signed)
 PROGRESS NOTE   Subjective/Complaints: Patient would like to go home today, he is scheduled to go tomorrow but his son cannot pick him up tomorrow.  Plan for patient to leave end of day after therapies.  ROS: Patient denies fever, rash, sore throat, blurred vision, dizziness, nausea, vomiting, diarrhea, cough, shortness of breath or chest pain, joint or back/neck pain, headache, or mood change.    Objective:   No results found.  Recent Labs    12/10/23 0509  WBC 6.3  HGB 12.3*  HCT 35.2*  PLT 174     Recent Labs    12/10/23 0509  NA 138  K 4.2  CL 104  CO2 20*  GLUCOSE 111*  BUN 30*  CREATININE 2.26*  CALCIUM  8.9      Intake/Output Summary (Last 24 hours) at 12/10/2023 1341 Last data filed at 12/10/2023 1256 Gross per 24 hour  Intake 1040 ml  Output 500 ml  Net 540 ml        Physical Exam: Vital Signs Blood pressure (!) 149/86, pulse 77, temperature 97.7 F (36.5 C), temperature source Oral, resp. rate 16, height 6' 1 (1.854 m), weight 85.5 kg, SpO2 97%.  Constitutional: No distress . Vital signs reviewed. HEENT: NCAT, EOMI, oral membranes dry Neck: supple Cardiovascular: RRR without murmur. No JVD    Respiratory/Chest: CTA Bilaterally without wheezes or rales. Normal effort    GI/Abdomen: BS +, non-tender, non-distended Ext: no clubbing, cyanosis, or edema Psych: Very pleasant and jovial Skin: No evidence of breakdown, no evidence of rash Neurologic: Cranial nerves II through XII grossly intact, motor strength is 5/5 in right and 4/5 left deltoid, bicep, tricep, grip, hip flexor, knee extensors, ankle dorsiflexor and plantar flexor Sensory exam normal sensation to light touch and proprioception in bilateral upper and lower extremities Cerebellar exam mild ataxia L FNF   Prior neuro assessment is c/w today's exam 12/10/2023.   Musculoskeletal: Full range of motion in all 4 extremities. No joint  swelling.  No significant TTP left shoulder today, no significant pain with ROM      Assessment/Plan: 1. Functional deficits which require 3+ hours per day of interdisciplinary therapy in a comprehensive inpatient rehab setting. Physiatrist is providing close team supervision and 24 hour management of active medical problems listed below. Physiatrist and rehab team continue to assess barriers to discharge/monitor patient progress toward functional and medical goals  Care Tool:  Bathing    Body parts bathed by patient: Right arm, Chest, Left arm, Abdomen, Front perineal area, Buttocks, Right upper leg, Left upper leg, Right lower leg, Left lower leg, Face         Bathing assist Assist Level: Minimal Assistance - Patient > 75% (For balance)     Upper Body Dressing/Undressing Upper body dressing   What is the patient wearing?: Pull over shirt    Upper body assist Assist Level: Set up assist    Lower Body Dressing/Undressing Lower body dressing      What is the patient wearing?: Pants, Underwear/pull up     Lower body assist Assist for lower body dressing: Minimal Assistance - Patient > 75%     Toileting Toileting  Toileting assist Assist for toileting: Minimal Assistance - Patient > 75%     Transfers Chair/bed transfer  Transfers assist     Chair/bed transfer assist level: Supervision/Verbal cueing     Locomotion Ambulation   Ambulation assist      Assist level: Supervision/Verbal cueing Assistive device: Walker-rolling Max distance: 250'   Walk 10 feet activity   Assist     Assist level: Supervision/Verbal cueing Assistive device: Walker-rolling   Walk 50 feet activity   Assist    Assist level: Supervision/Verbal cueing Assistive device: Walker-rolling    Walk 150 feet activity   Assist Walk 150 feet activity did not occur: Safety/medical concerns  Assist level: Supervision/Verbal cueing Assistive device: Walker-rolling     Walk 10 feet on uneven surface  activity   Assist Walk 10 feet on uneven surfaces activity did not occur: Safety/medical concerns   Assist level: Supervision/Verbal cueing Assistive device: Walker-rolling   Wheelchair     Assist Is the patient using a wheelchair?: No (ambulates on unit) Type of Wheelchair: Manual    Wheelchair assist level: Supervision/Verbal cueing Max wheelchair distance: 200'    Wheelchair 50 feet with 2 turns activity    Assist        Assist Level: Supervision/Verbal cueing   Wheelchair 150 feet activity     Assist      Assist Level: Supervision/Verbal cueing   Blood pressure (!) 149/86, pulse 77, temperature 97.7 F (36.5 C), temperature source Oral, resp. rate 16, height 6' 1 (1.854 m), weight 85.5 kg, SpO2 97%.   Medical Problem List and Plan: 1. Functional deficits secondary to right thalamic infarct, small SAH post CABG             -he has made nice neurological gains, but still with sensory/propioceptive deficits and ataxia.              -patient may shower             -ELOS/Goals: 5-7 days, mod I to supervision   - Stable to continue inpatient rehab  - 8/28: Reports of increased numbness/tingling through his left arm and face late this morning after evaluation; repeat CT head unchanged.  Neurology consult appreciated , no new infarct  Symptoms c/w R thalamic infarct previously diagnosed   - Expected discharge 12/11/2023  --Continue CIR therapies including PT, OT   - Discharge today after therapies 2.  Antithrombotics: -DVT/anticoagulation:  Mechanical: Sequential compression devices, below knee Bilateral lower extremities             -antiplatelet therapy: Aspirin  81 mg (hx of bleeding on plavix )   3. Pain Management: Arthritis bilat hands and ankles: request home med nabumetone  750 mg---will resume   - No complaints of pain 8-27  - 9/1 reports continued left shoulder pain, reports chronic.  Patient says this is under  control and does not want any changes made at this time  -9/2 pain overall controlled, continue current regimen  9/3 Voltaren  gel for left shoulder pain, patient agreeable to try this  9/4 left shoulder pain improved with Voltaren   9/7-8  left arm pain seems to be under reasonable control at present  4. Mood/Behavior/Sleep: LCSW to follow for evaluation and support when available.              -antipsychotic agents:  Does not want Buspar  makes me feel horrible --discontinue as needed.  Denies need of anything for anxiety.             -  Insomnia: Requesting home Temazepam  15 mg for insomnia-ordered --using as needed, with significant benefit             5. Neuropsych/cognition: This patient is capable of making decisions on his own behalf. 6. Skin/Wound Care: routine pressure relief measures  7. Fluids/Electrolytes/Nutrition: monitor I&O and follow up chemistries              -Heart healthy diet   8. Hereditary angioedema: Patient supplied med (icatibant ) is in pharmacy fridge-patient had 1 episode of angioedema on 8/25 which was successfully treated with icatibant  PRN.              -uses takhzyro  inj sq every 2 weeks at home   - 8/28: Family brought Takhzyro  form home - reviewed med data sheet, no contraindications with stroke or interactions with current meds - ordered for every other Friday (pharmacy will need to confirm he did not get it day of admission 8/22)  9. CAD s/p stenting HLD: LDL 106 on Pitavastatin  4 mg daily and Zetia               -Start Leqvio as outpatient therapy             -see meds below for PAF/HTN/CHF             -nitro prn CP  10. HTN/CHF: norvasc  and losartan               -bp goal of less than 180/105   - Blood pressure well-controlled  8-28: Blood pressure soft overnight and this a.m.; will get orthostatic vitals.  Will get daily weights for CHF.  8/29 BP stable continue current regimen and monitor/follow asked nursing to check weights, no signs of overload  9/8  BP overall controlled, continue current regimen and follow-up with PCP for further monitoring    12/10/2023    5:00 AM 12/10/2023    3:46 AM 12/09/2023    7:23 PM  Vitals with BMI  Weight 188 lbs 8 oz    BMI 24.87    Systolic  149 139  Diastolic  86 74  Pulse  77 68    Filed Weights   12/08/23 0500 12/09/23 0517 12/10/23 0500  Weight: 85.1 kg 85 kg 85.5 kg    11. T2DM: A1c 5.6    - Blood sugars well-controlled on current regimen  -9/7-8 cbg under reasonable control at present Recent Labs    12/09/23 2044 12/10/23 0531 12/10/23 1145  GLUCAP 160* 125* 124*     12. Post op PAF after CABG             -HR now controlled, regular             -metoprolol  xl 25mg  daily,  13. CKD IIIb. Cr has been stable around  1.8   - Creatinine 1.77, stable.  Monitor . - 9/1 BUN/creatinine overall higher today at 1.97/36  -9/3 improved creatinine to 1.71/BUN 33  -9/8 creatinine a little higher today, discussed with patient's and discussed oral hydration.  Follow-up with PCP for recheck Recheck Monday 14. Hx of prostate cancer/hematuria 15. Hx hypothyroid--levothyroxine   16. Hx of interstitial lung disease/allergies/OSA   - Sleep study performed downstairs, started on CPAP             -albuterol  prn             -flonase  and azelastine  nasal sprays bid   - Add petroleum jelly to nares as needed before placing nasal pillow--ordered standing 8-28  -  8-28: Patient reports that final sleep study stated that he does not need CPAP.  Bedside educational manual seems to just be general information on stroke, but patient is insistent that he cannot tolerate it and will not use it for now on.  17. Constipation. Home dulcolax 15 mg daily ordered per patient request.   - 9/1 LBM 8/29, add MiraLAX , sorbitol  as needed  -9/4 chart review indicates he had bowel movement today, he reports he took Dulcolax last night  -9/8 LBM yesterday   LOS: 13 days A FACE TO FACE EVALUATION WAS PERFORMED  Murray Collier 12/10/2023, 1:41 PM

## 2023-12-11 NOTE — Progress Notes (Signed)
 Inpatient Rehabilitation Care Coordinator Discharge Note   Patient Details  Name: Daniel Herrera MRN: [id number] Date of Birth: Jun 01, 1940   Discharge location: D/c to home  Length of Stay: 13 days  Discharge activity level: Supervision  Home/community participation: Limited  Patient response un:Yzjouy Literacy - How often do you need to have someone help you when you read instructions, pamphlets, or other written material from your doctor or pharmacy?: Never  Patient response un:Dnrpjo Isolation - How often do you feel lonely or isolated from those around you?: Never  Services provided included: MD, RD, PT, OT, RN, CM, TR, Pharmacy, SW, Neuropsych  Financial Services:  Field seismologist Utilized: Medicare    Choices offered to/list presented to: patient  Follow-up services arranged:  Home Health, DME Home Health Agency: [facility] for HHPT/OT/aide    DME : [facility] for RW    Patient response to transportation need: Is the patient able to respond to transportation needs?: Yes In the past 12 months, has lack of transportation kept you from medical appointments or from getting medications?: No In the past 12 months, has lack of transportation kept you from meetings, work, or from getting things needed for daily living?: No   Patient/Family verbalized understanding of follow-up arrangements:  Yes  Individual responsible for coordination of the follow-up plan: contact pt or pt dtr Onetha  Confirmed correct DME delivered: Graeme DELENA Jude 12/11/2023    Comments (or additional information):  Summary of Stay    Date/Time Discharge Planning CSW  12/03/23 1140 Pt will d/c to home with wife who can only provide supervision due to residual affects from stroke (physical and speech). Reports will have neighbors and friends check in. Son and DIL live around the corner and can only offer PRN support. Pt will need to be as independent as possible. SW will confirm there  are no barriers to discharge. AAC       Lutie Pickler A Jude

## 2023-12-12 DIAGNOSIS — D841 Defects in the complement system: Secondary | ICD-10-CM | POA: Diagnosis not present

## 2023-12-12 DIAGNOSIS — G4700 Insomnia, unspecified: Secondary | ICD-10-CM | POA: Diagnosis not present

## 2023-12-12 DIAGNOSIS — J849 Interstitial pulmonary disease, unspecified: Secondary | ICD-10-CM | POA: Diagnosis not present

## 2023-12-12 DIAGNOSIS — M159 Polyosteoarthritis, unspecified: Secondary | ICD-10-CM | POA: Diagnosis not present

## 2023-12-12 DIAGNOSIS — E039 Hypothyroidism, unspecified: Secondary | ICD-10-CM | POA: Diagnosis not present

## 2023-12-12 DIAGNOSIS — M109 Gout, unspecified: Secondary | ICD-10-CM | POA: Diagnosis not present

## 2023-12-12 DIAGNOSIS — I130 Hypertensive heart and chronic kidney disease with heart failure and stage 1 through stage 4 chronic kidney disease, or unspecified chronic kidney disease: Secondary | ICD-10-CM | POA: Diagnosis not present

## 2023-12-12 DIAGNOSIS — K529 Noninfective gastroenteritis and colitis, unspecified: Secondary | ICD-10-CM | POA: Diagnosis not present

## 2023-12-12 DIAGNOSIS — I69354 Hemiplegia and hemiparesis following cerebral infarction affecting left non-dominant side: Secondary | ICD-10-CM | POA: Diagnosis not present

## 2023-12-12 DIAGNOSIS — I69322 Dysarthria following cerebral infarction: Secondary | ICD-10-CM | POA: Diagnosis not present

## 2023-12-12 DIAGNOSIS — J302 Other seasonal allergic rhinitis: Secondary | ICD-10-CM | POA: Diagnosis not present

## 2023-12-12 DIAGNOSIS — I8390 Asymptomatic varicose veins of unspecified lower extremity: Secondary | ICD-10-CM | POA: Diagnosis not present

## 2023-12-12 DIAGNOSIS — K5730 Diverticulosis of large intestine without perforation or abscess without bleeding: Secondary | ICD-10-CM | POA: Diagnosis not present

## 2023-12-12 DIAGNOSIS — G4733 Obstructive sleep apnea (adult) (pediatric): Secondary | ICD-10-CM | POA: Diagnosis not present

## 2023-12-12 DIAGNOSIS — I214 Non-ST elevation (NSTEMI) myocardial infarction: Secondary | ICD-10-CM | POA: Diagnosis not present

## 2023-12-12 DIAGNOSIS — I480 Paroxysmal atrial fibrillation: Secondary | ICD-10-CM | POA: Diagnosis not present

## 2023-12-12 DIAGNOSIS — I509 Heart failure, unspecified: Secondary | ICD-10-CM | POA: Diagnosis not present

## 2023-12-12 DIAGNOSIS — N1832 Chronic kidney disease, stage 3b: Secondary | ICD-10-CM | POA: Diagnosis not present

## 2023-12-12 DIAGNOSIS — I69393 Ataxia following cerebral infarction: Secondary | ICD-10-CM | POA: Diagnosis not present

## 2023-12-12 DIAGNOSIS — F419 Anxiety disorder, unspecified: Secondary | ICD-10-CM | POA: Diagnosis not present

## 2023-12-12 DIAGNOSIS — E7800 Pure hypercholesterolemia, unspecified: Secondary | ICD-10-CM | POA: Diagnosis not present

## 2023-12-12 DIAGNOSIS — I2510 Atherosclerotic heart disease of native coronary artery without angina pectoris: Secondary | ICD-10-CM | POA: Diagnosis not present

## 2023-12-12 DIAGNOSIS — E1122 Type 2 diabetes mellitus with diabetic chronic kidney disease: Secondary | ICD-10-CM | POA: Diagnosis not present

## 2023-12-12 DIAGNOSIS — K219 Gastro-esophageal reflux disease without esophagitis: Secondary | ICD-10-CM | POA: Diagnosis not present

## 2023-12-12 DIAGNOSIS — I69392 Facial weakness following cerebral infarction: Secondary | ICD-10-CM | POA: Diagnosis not present

## 2023-12-12 NOTE — Progress Notes (Signed)
 Daniel Herrera, [facility] 12/10/2023 13:48    Inpatient Rehabilitation Discharge Medication Review by a Pharmacist     A complete drug regimen review was completed for this patient to identify any potential clinically significant medication issues.   High Risk Drug Classes Is patient taking? Indication by Medication  Antipsychotic No    Anticoagulant No    Antibiotic No    Opioid No    Antiplatelet Yes bASA- CVA   Hypoglycemics/insulin  No    Vasoactive Medication Yes Losartan , amlodipine - HTN, Metoprolol  XL-HTN, PAF  Nitroglycerin  SL - prn chest pain  Chemotherapy No    Other Yes Pantoprazole  - reflux Temazepam - insomnia Synthroid - hypothyroidism  Pitavastatin , zetia - HLD  Flonase - seasonal allergies Nabumetone - arthritic pain Takhzyro - Hereditary angioedema prophylaxis   Firazyr - acute attacks of Hereditary angioedema Cyanocobalamine, multivitamin, probiotic- supplements Albuterol  -interstitial lung disease/allergies/OSA  Fluoruracil cream- skin cancer        Type of Medication Issue Identified Description of Issue Recommendation(s)  Drug Interaction(s) (clinically significant)        Duplicate Therapy        Allergy        No Medication Administration End Date        Incorrect Dose        Additional Drug Therapy Needed        Significant med changes from prior encounter (inform family/care partners about these prior to discharge). Per inpatient discharge summary, recommended to start Leqvio as an outpatient.   Communicate to patient/family members at discharge from CIR.    Other            Clinically significant medication issues were identified that warrant physician communication and completion of prescribed/recommended actions by midnight of the next day:  No   Name of provider notified for urgent issues identified:    Provider Method of Notification:      Pharmacist comments:    Time spent performing this drug regimen review (minutes): 30   [name],  [place] Clinical Pharmacist 12/10/2023 1:17 PM

## 2023-12-17 DIAGNOSIS — I69322 Dysarthria following cerebral infarction: Secondary | ICD-10-CM | POA: Diagnosis not present

## 2023-12-17 DIAGNOSIS — J849 Interstitial pulmonary disease, unspecified: Secondary | ICD-10-CM | POA: Diagnosis not present

## 2023-12-17 DIAGNOSIS — I130 Hypertensive heart and chronic kidney disease with heart failure and stage 1 through stage 4 chronic kidney disease, or unspecified chronic kidney disease: Secondary | ICD-10-CM | POA: Diagnosis not present

## 2023-12-17 DIAGNOSIS — I69393 Ataxia following cerebral infarction: Secondary | ICD-10-CM | POA: Diagnosis not present

## 2023-12-17 DIAGNOSIS — I69392 Facial weakness following cerebral infarction: Secondary | ICD-10-CM | POA: Diagnosis not present

## 2023-12-17 DIAGNOSIS — I69354 Hemiplegia and hemiparesis following cerebral infarction affecting left non-dominant side: Secondary | ICD-10-CM | POA: Diagnosis not present

## 2023-12-19 DIAGNOSIS — I69354 Hemiplegia and hemiparesis following cerebral infarction affecting left non-dominant side: Secondary | ICD-10-CM | POA: Diagnosis not present

## 2023-12-19 DIAGNOSIS — I69392 Facial weakness following cerebral infarction: Secondary | ICD-10-CM | POA: Diagnosis not present

## 2023-12-19 DIAGNOSIS — I69322 Dysarthria following cerebral infarction: Secondary | ICD-10-CM | POA: Diagnosis not present

## 2023-12-19 DIAGNOSIS — I69393 Ataxia following cerebral infarction: Secondary | ICD-10-CM | POA: Diagnosis not present

## 2023-12-19 DIAGNOSIS — I130 Hypertensive heart and chronic kidney disease with heart failure and stage 1 through stage 4 chronic kidney disease, or unspecified chronic kidney disease: Secondary | ICD-10-CM | POA: Diagnosis not present

## 2023-12-19 DIAGNOSIS — J849 Interstitial pulmonary disease, unspecified: Secondary | ICD-10-CM | POA: Diagnosis not present

## 2023-12-20 ENCOUNTER — Encounter: Admitting: Registered Nurse

## 2023-12-20 ENCOUNTER — Other Ambulatory Visit: Payer: Self-pay | Admitting: Cardiology

## 2023-12-20 DIAGNOSIS — E039 Hypothyroidism, unspecified: Secondary | ICD-10-CM | POA: Diagnosis not present

## 2023-12-20 DIAGNOSIS — E785 Hyperlipidemia, unspecified: Secondary | ICD-10-CM | POA: Diagnosis not present

## 2023-12-20 DIAGNOSIS — Z79899 Other long term (current) drug therapy: Secondary | ICD-10-CM | POA: Diagnosis not present

## 2023-12-20 DIAGNOSIS — D649 Anemia, unspecified: Secondary | ICD-10-CM | POA: Diagnosis not present

## 2023-12-20 DIAGNOSIS — D841 Defects in the complement system: Secondary | ICD-10-CM | POA: Diagnosis not present

## 2023-12-20 DIAGNOSIS — I10 Essential (primary) hypertension: Secondary | ICD-10-CM | POA: Diagnosis not present

## 2023-12-20 DIAGNOSIS — I63212 Cerebral infarction due to unspecified occlusion or stenosis of left vertebral arteries: Secondary | ICD-10-CM

## 2023-12-20 DIAGNOSIS — I6930 Unspecified sequelae of cerebral infarction: Secondary | ICD-10-CM | POA: Diagnosis not present

## 2023-12-20 DIAGNOSIS — E1169 Type 2 diabetes mellitus with other specified complication: Secondary | ICD-10-CM | POA: Diagnosis not present

## 2023-12-20 DIAGNOSIS — I639 Cerebral infarction, unspecified: Secondary | ICD-10-CM

## 2023-12-20 DIAGNOSIS — Z6825 Body mass index (BMI) 25.0-25.9, adult: Secondary | ICD-10-CM | POA: Diagnosis not present

## 2023-12-20 DIAGNOSIS — I440 Atrioventricular block, first degree: Secondary | ICD-10-CM

## 2023-12-20 DIAGNOSIS — N1831 Chronic kidney disease, stage 3a: Secondary | ICD-10-CM | POA: Diagnosis not present

## 2023-12-24 DIAGNOSIS — H524 Presbyopia: Secondary | ICD-10-CM | POA: Diagnosis not present

## 2023-12-24 DIAGNOSIS — H35373 Puckering of macula, bilateral: Secondary | ICD-10-CM | POA: Diagnosis not present

## 2023-12-24 DIAGNOSIS — E119 Type 2 diabetes mellitus without complications: Secondary | ICD-10-CM | POA: Diagnosis not present

## 2023-12-25 DIAGNOSIS — I69354 Hemiplegia and hemiparesis following cerebral infarction affecting left non-dominant side: Secondary | ICD-10-CM | POA: Diagnosis not present

## 2023-12-25 DIAGNOSIS — J849 Interstitial pulmonary disease, unspecified: Secondary | ICD-10-CM | POA: Diagnosis not present

## 2023-12-25 DIAGNOSIS — I130 Hypertensive heart and chronic kidney disease with heart failure and stage 1 through stage 4 chronic kidney disease, or unspecified chronic kidney disease: Secondary | ICD-10-CM | POA: Diagnosis not present

## 2023-12-25 DIAGNOSIS — I69393 Ataxia following cerebral infarction: Secondary | ICD-10-CM | POA: Diagnosis not present

## 2023-12-25 DIAGNOSIS — I69392 Facial weakness following cerebral infarction: Secondary | ICD-10-CM | POA: Diagnosis not present

## 2023-12-25 DIAGNOSIS — I69322 Dysarthria following cerebral infarction: Secondary | ICD-10-CM | POA: Diagnosis not present

## 2023-12-27 DIAGNOSIS — I69392 Facial weakness following cerebral infarction: Secondary | ICD-10-CM | POA: Diagnosis not present

## 2023-12-27 DIAGNOSIS — I69322 Dysarthria following cerebral infarction: Secondary | ICD-10-CM | POA: Diagnosis not present

## 2023-12-27 DIAGNOSIS — I69393 Ataxia following cerebral infarction: Secondary | ICD-10-CM | POA: Diagnosis not present

## 2023-12-27 DIAGNOSIS — I130 Hypertensive heart and chronic kidney disease with heart failure and stage 1 through stage 4 chronic kidney disease, or unspecified chronic kidney disease: Secondary | ICD-10-CM | POA: Diagnosis not present

## 2023-12-27 DIAGNOSIS — J849 Interstitial pulmonary disease, unspecified: Secondary | ICD-10-CM | POA: Diagnosis not present

## 2023-12-27 DIAGNOSIS — I69354 Hemiplegia and hemiparesis following cerebral infarction affecting left non-dominant side: Secondary | ICD-10-CM | POA: Diagnosis not present

## 2023-12-28 DIAGNOSIS — Z23 Encounter for immunization: Secondary | ICD-10-CM | POA: Diagnosis not present

## 2023-12-29 DIAGNOSIS — I63212 Cerebral infarction due to unspecified occlusion or stenosis of left vertebral arteries: Secondary | ICD-10-CM | POA: Diagnosis not present

## 2023-12-29 DIAGNOSIS — I639 Cerebral infarction, unspecified: Secondary | ICD-10-CM | POA: Diagnosis not present

## 2023-12-29 DIAGNOSIS — I440 Atrioventricular block, first degree: Secondary | ICD-10-CM | POA: Diagnosis not present

## 2023-12-31 DIAGNOSIS — I69322 Dysarthria following cerebral infarction: Secondary | ICD-10-CM | POA: Diagnosis not present

## 2023-12-31 DIAGNOSIS — I69392 Facial weakness following cerebral infarction: Secondary | ICD-10-CM | POA: Diagnosis not present

## 2023-12-31 DIAGNOSIS — I130 Hypertensive heart and chronic kidney disease with heart failure and stage 1 through stage 4 chronic kidney disease, or unspecified chronic kidney disease: Secondary | ICD-10-CM | POA: Diagnosis not present

## 2023-12-31 DIAGNOSIS — I69393 Ataxia following cerebral infarction: Secondary | ICD-10-CM | POA: Diagnosis not present

## 2023-12-31 DIAGNOSIS — I69354 Hemiplegia and hemiparesis following cerebral infarction affecting left non-dominant side: Secondary | ICD-10-CM | POA: Diagnosis not present

## 2023-12-31 DIAGNOSIS — J849 Interstitial pulmonary disease, unspecified: Secondary | ICD-10-CM | POA: Diagnosis not present

## 2023-12-31 NOTE — Progress Notes (Signed)
 Subjective:    Patient ID: Daniel Herrera, male    DOB: 1940-07-22, 83 y.o.   MRN: [id number]  HPI: Daniel Herrera is a 83 y.o. male who is here for HFU appointment for F/U of his SAH, Right Thalamic Infarction, Essential Hypertension and hyperlipidemia. He presented to [facility] ED on 11/23/2023 with acute onset of left sided weakness.  Dr: Michaela : H&P: 11/23/2023  Daniel Herrera is a 83 y.o. male with hx of history of hereditary angioedema type II, PAF on ASA, HTN, HLD, CAD s/p CABG, MI, prior stroke, CHF, CKD, GERD, hx of GI bleed, history of prostate cancer, hypothyroidism, diabetes who presents to [facility], ED via private vehicle for acute onset of left-sided weakness.  Last known well 1515.  Patient states he last had a sandwich around 3:00, then was sitting in the chair and was unable to get up to the chair to go to the bathroom and complained of left leg numbness and inability to use his left leg.  He did experience a fall due to left leg weakness.  Family drove him to the emergency room.  NIH 9 for left facial droop, left arm and leg weakness, ataxia in both left arm and leg, decreased sensation on the left, dysarthria.  CT head with no acute process.  CT angio head and neck negative for LVO   Patient was deemed an appropriate candidate for TNK and TNK was administered. Risks, benefits and alternatives of IVT discussed with patient and family (daughter) and they agreed.  CTH personally reviewed by Dr. Michaela prior to TNK administration  CT Head: WO Contrast MPRESSION: 1.  No evidence of an acute intracranial abnormality. 2. Parenchymal atrophy and chronic small vessel ischemic disease. 3. Redemonstrated small chronic infarcts within the left cerebellar hemisphere. 4. Severe mucosal thickening within the left maxillary sinus.  CTA:  IMPRESSION: 1. Chronically occluded left vertebral artery. 2. Severe left and moderate to severe right P2 PCA stenosis. 3.  Severe right M2 MCA stenosis. 4. Moderate left A2 ACA stenosis. 5.  Aortic Atherosclerosis (ICD10-I70.0).   MR: Brain: WO Contrast IMPRESSION: 1. Positive for: - 1.5 cm acute lacunar infarct of the right thalamus. No hemorrhagic transformation there. No mass effect. - evidence of trace SAH left superior convexity (possible Heidelberg classification 3c: Subarachnoid hemorrhage.). Recommend repeat noncontrast Head CT to further evaluate.   2. No intracranial mass effect. No other acute intracranial abnormality. Chronic distal left vertebral artery occlusion and patchy chronic left cerebellar infarcts.  He was admitted to inpatient rehabilitation on 11/27/2023 and discharged home on 12/10/2023. He is receiving Home Health Therapy from [facility]. He denies any pain. He rates his pain 0. Also reports he has a good appetite.    Pain Inventory Average Pain 8 Pain Right Now 0 My pain is intermittent and burning  LOCATION OF PAIN  left leg, left foot, left knee  BOWEL Number of stools per week: 7 or more Oral laxative use No  Type of laxative Probiotic   BLADDER Normal    Mobility use a walker do you drive?  no Do you have any goals in this area?  yes  Function retired  Neuro/Psych trouble walking  Prior Studies Any changes since last visit?  no  Physicians involved in your care Any changes since last visit?  no   Family History  Problem Relation Age of Onset   Cancer Mother    Stomach cancer Mother    Cancer  Father        prostate   Cancer Sister    Sleep apnea Neg Hx    Social History   Socioeconomic History   Marital status: Married    Spouse name: Zelda   Number of children: 3   Years of education: Not on file   Highest education level: Bachelor's degree (e.g., BA, AB, BS)  Occupational History   Occupation: retired  Tobacco Use   Smoking status: Never   Smokeless tobacco: Never  Vaping Use   Vaping status: Never Used  Substance  and Sexual Activity   Alcohol  use: Never   Drug use: No   Sexual activity: Not Currently  Other Topics Concern   Not on file  Social History Narrative   Not on file   Social Drivers of Health   Financial Resource Strain: Not on file  Food Insecurity: No Food Insecurity (11/25/2023)   Hunger Vital Sign    Worried About Running Out of Food in the Last Year: Never true    Ran Out of Food in the Last Year: Never true  Transportation Needs: No Transportation Needs (11/25/2023)   PRAPARE - Administrator, Civil Service (Medical): No    Lack of Transportation (Non-Medical): No  Physical Activity: Inactive (09/30/2018)   Received from [facility]   Exercise Vital Sign    On average, how many days per week do you engage in moderate to strenuous exercise (like a brisk walk)?: 0 days    On average, how many minutes do you engage in exercise at this level?: 0 min  Stress: No Stress Concern Present (09/30/2018)   Received from [facility] - Occupational Stress Questionnaire    Feeling of Stress : Not at all  Social Connections: Unknown (11/25/2023)   Social Connection and Isolation Panel    Frequency of Communication with Friends and Family: More than three times a week    Frequency of Social Gatherings with Friends and Family: More than three times a week    Attends Religious Services: Not on file    Active Member of Clubs or Organizations: Not on file    Attends Banker Meetings: Not on file    Marital Status: Not on file   Past Surgical History:  Procedure Laterality Date   APPENDECTOMY     BACK SURGERY     CARDIAC CATHETERIZATION N/A 02/21/2015   Procedure: Left Heart Cath;  Surgeon: Dorn JINNY Lesches, MD;  Location: [facility] INVASIVE CV LAB;  Service: Cardiovascular;  Laterality: N/A;   CATARACT EXTRACTION, BILATERAL     CORONARY ARTERY BYPASS GRAFT N/A 05/24/2018   Procedure: CORONARY ARTERY BYPASS GRAFTING (CABG),  ON PUMP, TIMES THREE, USING BILATERAL INTERNAL MAMMARY ARTERY AND HARVESTED LEFT RADIAL ARTERY;  Surgeon: Lucas Dorise POUR, MD;  Location: [facility] OR;  Service: Open Heart Surgery;  Laterality: N/A;   CYSTOSCOPY WITH LITHOLAPAXY N/A 07/26/2020   Procedure: CYSTOSCOPY WITH LITHOLAPAXY WITH FULGERATION;  Surgeon: Renda Glance, MD;  Location: [facility] ORS;  Service: Urology;  Laterality: N/A;   HOLMIUM LASER APPLICATION N/A 07/26/2020   Procedure: HOLMIUM LASER APPLICATION;  Surgeon: Renda Glance, MD;  Location: [facility] ORS;  Service: Urology;  Laterality: N/A;   I & D EXTREMITY Right 09/02/2020   Procedure: IRRIGATION AND DEBRIDEMENT RIGHT 2ND AND 3RD METATARSAL;  Surgeon: Kit Rush, MD;  Location: [facility] OR;  Service: Orthopedics;  Laterality: Right;   LEFT HEART CATH AND CORONARY  ANGIOGRAPHY N/A 05/22/2018   Procedure: LEFT HEART CATH AND CORONARY ANGIOGRAPHY;  Surgeon: Dann Candyce RAMAN, MD;  Location: [facility] INVASIVE CV LAB;  Service: Cardiovascular;  Laterality: N/A;   NASAL SINUS SURGERY     ORIF TOE FRACTURE Right 09/02/2020   Procedure: OPEN REDUCTION INTERNAL FIXATION (ORIF) OF RIGHT OPEN 2ND AND 3RD METATARSAL (TOE) OPEN FRACTURE;  Surgeon: Kit Rush, MD;  Location: [facility] OR;  Service: Orthopedics;  Laterality: Right;   PROSTATE BIOPSY     RADIAL ARTERY HARVEST Left 05/24/2018   Procedure: RADIAL ARTERY HARVEST;  Surgeon: Lucas Dorise POUR, MD;  Location: [facility] OR;  Service: Open Heart Surgery;  Laterality: Left;   right shoulder rotator cuff repair     TEE WITHOUT CARDIOVERSION N/A 05/24/2018   Procedure: TRANSESOPHAGEAL ECHOCARDIOGRAM (TEE);  Surgeon: Lucas Dorise POUR, MD;  Location: [facility] OR;  Service: Open Heart Surgery;  Laterality: N/A;   Past Medical History:  Diagnosis Date   Abnormal EKG 02/21/2015   Acute lower GI bleeding 05/28/2019   Anginal pain 2016   Angio-edema    Anxiety    Arthritis    CAD S/P percutaneous coronary angioplasty    NSTEMI treated with LAD PCI with DES x 03 Feb 2015 Myoview low risk  Nov 2017   Cataract    Cerebellar stroke (HCC) 06/14/2021   CHF (congestive heart failure) (HCC) 2020   after surgery only   Chronic diarrhea of unknown origin    CKD (chronic kidney disease) stage 3, GFR 30-59 ml/min 12/21/2017   Colitis 05/23/2015   Coronary artery disease 05/28/2019   Diverticular hemorrhage    Diverticulosis    Dyslipidemia, goal LDL below 70 02/21/2015   hyperlipidemia    Dysrhythmia    A-fib   Enterotoxigenic Escherichia coli infection 05/24/2015   Essential hypertension 02/21/2015   Geographic tongue    GERD (gastroesophageal reflux disease)    GERD without esophagitis 06/15/2021   GI bleed 12/21/2017   Glossitis, benign migratory 12/04/2018   Gout    Hematochezia 12/21/2017   Admitted Sept 2019 with lower GI bleeding felt to be secondary to hemorrhoids   Hereditary angioedema type 2 (HCC) 07/28/2017   High cholesterol    History of kidney stones    History of non-ST elevation myocardial infarction (NSTEMI) 02/22/2015   Nov 2016   History of prolonged Q-T interval on ECG    History of prostate cancer    Hoarseness 12/04/2018   Hx of Clostridium difficile infection    Hx of umbilical hernia repair    Hypertension    Hypothyroidism    Hypothyroidism, unspecified 06/15/2021   ILD (interstitial lung disease) (HCC) 12/17/2013   ?related to chicken farm exposure or aspiration pneumonitis from GERD Previously attributed to acute interstitial pneumonitis but based on HRCT this is likely IPF, probable UIP   Lower GI bleed 06/03/2019   Malignant neoplasm of prostate (HCC) 08/07/2014   Myocardial infarction [facility]) 2016   NSTEMI (non-ST elevated myocardial infarction) (HCC) 05/21/2018   Open metatarsal fracture 09/02/2020   PAF (paroxysmal atrial fibrillation) (HCC) 06/13/2018   Post CABG- discharged on Amiodarone , he will probably not need this long term   Palpitations 03/15/2015   palpitations    Peripheral vascular disease    vericose veins    Personal history of digestive disease    gastric ulcer   Pre-diabetes    Pre-operative clearance 01/02/2018   Prostate cancer (HCC)    Prostate enlargement    S/P CABG x 3 05/28/2018  LIMA-LAD, RIMA-RCA, and Lt radial to OM.   Skin cancer    basal cell carcinoma   STEC (Shiga toxin-producing Escherichia coli) infection 05/24/2015   Stroke (cerebrum) (HCC) 06/15/2021   Tachycardia determined by examination of pulse    Thyroid  disease    Type 2 diabetes mellitus with unspecified complications (HCC)    Type 2 diabetes mellitus without complications (HCC) 06/15/2021   Urticaria    There were no vitals taken for this visit.  Opioid Risk Score:   Fall Risk Score:  `1  Depression screen PHQ 2/9     06/02/2019    4:05 PM 02/04/2015    9:22 AM 12/25/2014    8:24 AM 12/10/2014    8:30 AM 12/04/2014    8:19 AM  Depression screen PHQ 2/9  Decreased Interest 0 0 0 0 0  Down, Depressed, Hopeless 0 0 0 0 0  PHQ - 2 Score 0 0 0 0 0    Review of Systems  Cardiovascular:  Positive for leg swelling.       Left leg swelling  Musculoskeletal:  Positive for gait problem.  All other systems reviewed and are negative.      Objective:   Physical Exam Vitals and nursing note reviewed.  Constitutional:      Appearance: Normal appearance.  Cardiovascular:     Rate and Rhythm: Normal rate and regular rhythm.     Pulses: Normal pulses.     Heart sounds: Normal heart sounds.  Pulmonary:     Effort: Pulmonary effort is normal.     Breath sounds: Normal breath sounds.  Musculoskeletal:     Comments: Normal Muscle Bulk and Muscle Testing Reveals:  Upper Extremities:Full ROM and Muscle Strength 5/5  Lower Extremities: Full ROM and Muscle Strength 5/5 Arises from Table slowly using walker for support Narrow Based  Gait     Skin:    General: Skin is warm and dry.  Neurological:     Mental Status: He is alert and oriented to person, place, and time.  Psychiatric:        Mood and Affect: Mood  normal.        Behavior: Behavior normal.          Assessment & Plan:  1.SAH, Right Thalamic Infarction: Continue Home Health Therapy. Continue current medication regimen. Has a scheduled appointment with Neurology 2.  Essential Hypertension: Continue current medication regimen. Continue to Monitor. PCP following.  3. Hyperlipidemia.Continue current medication regimen. PCP Following . Continue to Monitor.   F/U with Dr Emeline in 4- 6 weeks

## 2024-01-01 ENCOUNTER — Encounter: Attending: Registered Nurse | Admitting: Registered Nurse

## 2024-01-01 ENCOUNTER — Encounter: Payer: Self-pay | Admitting: Registered Nurse

## 2024-01-01 VITALS — BP 137/74 | HR 63 | Ht 73.0 in | Wt 191.0 lb

## 2024-01-01 DIAGNOSIS — E7849 Other hyperlipidemia: Secondary | ICD-10-CM | POA: Diagnosis not present

## 2024-01-01 DIAGNOSIS — I6381 Other cerebral infarction due to occlusion or stenosis of small artery: Secondary | ICD-10-CM | POA: Insufficient documentation

## 2024-01-01 DIAGNOSIS — I10 Essential (primary) hypertension: Secondary | ICD-10-CM | POA: Insufficient documentation

## 2024-01-01 DIAGNOSIS — I609 Nontraumatic subarachnoid hemorrhage, unspecified: Secondary | ICD-10-CM | POA: Diagnosis not present

## 2024-01-03 DIAGNOSIS — I69354 Hemiplegia and hemiparesis following cerebral infarction affecting left non-dominant side: Secondary | ICD-10-CM | POA: Diagnosis not present

## 2024-01-03 DIAGNOSIS — I69393 Ataxia following cerebral infarction: Secondary | ICD-10-CM | POA: Diagnosis not present

## 2024-01-03 DIAGNOSIS — I69322 Dysarthria following cerebral infarction: Secondary | ICD-10-CM | POA: Diagnosis not present

## 2024-01-03 DIAGNOSIS — J849 Interstitial pulmonary disease, unspecified: Secondary | ICD-10-CM | POA: Diagnosis not present

## 2024-01-03 DIAGNOSIS — I130 Hypertensive heart and chronic kidney disease with heart failure and stage 1 through stage 4 chronic kidney disease, or unspecified chronic kidney disease: Secondary | ICD-10-CM | POA: Diagnosis not present

## 2024-01-03 DIAGNOSIS — I69392 Facial weakness following cerebral infarction: Secondary | ICD-10-CM | POA: Diagnosis not present

## 2024-01-07 NOTE — Telephone Encounter (Signed)
 Strips sent from the event monitor he had documented atrial fibrillation.  I reviewed records from the hospital his course was complicated with intracranial hemorrhage after fibrinolytic therapy.  I sent a notification to his neurologist.  He has an appointment to follow-up with Dr. Revankar.

## 2024-01-08 DIAGNOSIS — I69354 Hemiplegia and hemiparesis following cerebral infarction affecting left non-dominant side: Secondary | ICD-10-CM | POA: Diagnosis not present

## 2024-01-08 DIAGNOSIS — I69392 Facial weakness following cerebral infarction: Secondary | ICD-10-CM | POA: Diagnosis not present

## 2024-01-08 DIAGNOSIS — I69393 Ataxia following cerebral infarction: Secondary | ICD-10-CM | POA: Diagnosis not present

## 2024-01-08 DIAGNOSIS — J849 Interstitial pulmonary disease, unspecified: Secondary | ICD-10-CM | POA: Diagnosis not present

## 2024-01-08 DIAGNOSIS — I130 Hypertensive heart and chronic kidney disease with heart failure and stage 1 through stage 4 chronic kidney disease, or unspecified chronic kidney disease: Secondary | ICD-10-CM | POA: Diagnosis not present

## 2024-01-08 DIAGNOSIS — I69322 Dysarthria following cerebral infarction: Secondary | ICD-10-CM | POA: Diagnosis not present

## 2024-01-10 DIAGNOSIS — I69322 Dysarthria following cerebral infarction: Secondary | ICD-10-CM | POA: Diagnosis not present

## 2024-01-10 DIAGNOSIS — I69393 Ataxia following cerebral infarction: Secondary | ICD-10-CM | POA: Diagnosis not present

## 2024-01-10 DIAGNOSIS — I69354 Hemiplegia and hemiparesis following cerebral infarction affecting left non-dominant side: Secondary | ICD-10-CM | POA: Diagnosis not present

## 2024-01-10 DIAGNOSIS — I130 Hypertensive heart and chronic kidney disease with heart failure and stage 1 through stage 4 chronic kidney disease, or unspecified chronic kidney disease: Secondary | ICD-10-CM | POA: Diagnosis not present

## 2024-01-10 DIAGNOSIS — J849 Interstitial pulmonary disease, unspecified: Secondary | ICD-10-CM | POA: Diagnosis not present

## 2024-01-10 DIAGNOSIS — I69392 Facial weakness following cerebral infarction: Secondary | ICD-10-CM | POA: Diagnosis not present

## 2024-01-11 DIAGNOSIS — I214 Non-ST elevation (NSTEMI) myocardial infarction: Secondary | ICD-10-CM | POA: Diagnosis not present

## 2024-01-11 DIAGNOSIS — I509 Heart failure, unspecified: Secondary | ICD-10-CM | POA: Diagnosis not present

## 2024-01-11 DIAGNOSIS — E1122 Type 2 diabetes mellitus with diabetic chronic kidney disease: Secondary | ICD-10-CM | POA: Diagnosis not present

## 2024-01-11 DIAGNOSIS — N1832 Chronic kidney disease, stage 3b: Secondary | ICD-10-CM | POA: Diagnosis not present

## 2024-01-11 DIAGNOSIS — I69393 Ataxia following cerebral infarction: Secondary | ICD-10-CM | POA: Diagnosis not present

## 2024-01-11 DIAGNOSIS — I69392 Facial weakness following cerebral infarction: Secondary | ICD-10-CM | POA: Diagnosis not present

## 2024-01-11 DIAGNOSIS — M159 Polyosteoarthritis, unspecified: Secondary | ICD-10-CM | POA: Diagnosis not present

## 2024-01-11 DIAGNOSIS — K529 Noninfective gastroenteritis and colitis, unspecified: Secondary | ICD-10-CM | POA: Diagnosis not present

## 2024-01-11 DIAGNOSIS — G4733 Obstructive sleep apnea (adult) (pediatric): Secondary | ICD-10-CM | POA: Diagnosis not present

## 2024-01-11 DIAGNOSIS — I480 Paroxysmal atrial fibrillation: Secondary | ICD-10-CM | POA: Diagnosis not present

## 2024-01-11 DIAGNOSIS — G4700 Insomnia, unspecified: Secondary | ICD-10-CM | POA: Diagnosis not present

## 2024-01-11 DIAGNOSIS — I2510 Atherosclerotic heart disease of native coronary artery without angina pectoris: Secondary | ICD-10-CM | POA: Diagnosis not present

## 2024-01-11 DIAGNOSIS — F419 Anxiety disorder, unspecified: Secondary | ICD-10-CM | POA: Diagnosis not present

## 2024-01-11 DIAGNOSIS — D841 Defects in the complement system: Secondary | ICD-10-CM | POA: Diagnosis not present

## 2024-01-11 DIAGNOSIS — M109 Gout, unspecified: Secondary | ICD-10-CM | POA: Diagnosis not present

## 2024-01-11 DIAGNOSIS — I69354 Hemiplegia and hemiparesis following cerebral infarction affecting left non-dominant side: Secondary | ICD-10-CM | POA: Diagnosis not present

## 2024-01-11 DIAGNOSIS — E039 Hypothyroidism, unspecified: Secondary | ICD-10-CM | POA: Diagnosis not present

## 2024-01-11 DIAGNOSIS — I69322 Dysarthria following cerebral infarction: Secondary | ICD-10-CM | POA: Diagnosis not present

## 2024-01-11 DIAGNOSIS — I130 Hypertensive heart and chronic kidney disease with heart failure and stage 1 through stage 4 chronic kidney disease, or unspecified chronic kidney disease: Secondary | ICD-10-CM | POA: Diagnosis not present

## 2024-01-11 DIAGNOSIS — K219 Gastro-esophageal reflux disease without esophagitis: Secondary | ICD-10-CM | POA: Diagnosis not present

## 2024-01-11 DIAGNOSIS — J849 Interstitial pulmonary disease, unspecified: Secondary | ICD-10-CM | POA: Diagnosis not present

## 2024-01-11 DIAGNOSIS — E7800 Pure hypercholesterolemia, unspecified: Secondary | ICD-10-CM | POA: Diagnosis not present

## 2024-01-11 DIAGNOSIS — K5730 Diverticulosis of large intestine without perforation or abscess without bleeding: Secondary | ICD-10-CM | POA: Diagnosis not present

## 2024-01-11 DIAGNOSIS — I8390 Asymptomatic varicose veins of unspecified lower extremity: Secondary | ICD-10-CM | POA: Diagnosis not present

## 2024-01-11 DIAGNOSIS — J302 Other seasonal allergic rhinitis: Secondary | ICD-10-CM | POA: Diagnosis not present

## 2024-01-15 DIAGNOSIS — J849 Interstitial pulmonary disease, unspecified: Secondary | ICD-10-CM | POA: Diagnosis not present

## 2024-01-15 DIAGNOSIS — I69393 Ataxia following cerebral infarction: Secondary | ICD-10-CM | POA: Diagnosis not present

## 2024-01-15 DIAGNOSIS — I69322 Dysarthria following cerebral infarction: Secondary | ICD-10-CM | POA: Diagnosis not present

## 2024-01-15 DIAGNOSIS — I69354 Hemiplegia and hemiparesis following cerebral infarction affecting left non-dominant side: Secondary | ICD-10-CM | POA: Diagnosis not present

## 2024-01-15 DIAGNOSIS — I130 Hypertensive heart and chronic kidney disease with heart failure and stage 1 through stage 4 chronic kidney disease, or unspecified chronic kidney disease: Secondary | ICD-10-CM | POA: Diagnosis not present

## 2024-01-15 DIAGNOSIS — I69392 Facial weakness following cerebral infarction: Secondary | ICD-10-CM | POA: Diagnosis not present

## 2024-01-17 DIAGNOSIS — I69393 Ataxia following cerebral infarction: Secondary | ICD-10-CM | POA: Diagnosis not present

## 2024-01-17 DIAGNOSIS — I69354 Hemiplegia and hemiparesis following cerebral infarction affecting left non-dominant side: Secondary | ICD-10-CM | POA: Diagnosis not present

## 2024-01-17 DIAGNOSIS — J849 Interstitial pulmonary disease, unspecified: Secondary | ICD-10-CM | POA: Diagnosis not present

## 2024-01-17 DIAGNOSIS — I69322 Dysarthria following cerebral infarction: Secondary | ICD-10-CM | POA: Diagnosis not present

## 2024-01-17 DIAGNOSIS — I130 Hypertensive heart and chronic kidney disease with heart failure and stage 1 through stage 4 chronic kidney disease, or unspecified chronic kidney disease: Secondary | ICD-10-CM | POA: Diagnosis not present

## 2024-01-17 DIAGNOSIS — I69392 Facial weakness following cerebral infarction: Secondary | ICD-10-CM | POA: Diagnosis not present

## 2024-01-22 ENCOUNTER — Inpatient Hospital Stay: Admitting: Neurology

## 2024-01-22 DIAGNOSIS — I69322 Dysarthria following cerebral infarction: Secondary | ICD-10-CM | POA: Diagnosis not present

## 2024-01-22 DIAGNOSIS — I69354 Hemiplegia and hemiparesis following cerebral infarction affecting left non-dominant side: Secondary | ICD-10-CM | POA: Diagnosis not present

## 2024-01-22 DIAGNOSIS — I130 Hypertensive heart and chronic kidney disease with heart failure and stage 1 through stage 4 chronic kidney disease, or unspecified chronic kidney disease: Secondary | ICD-10-CM | POA: Diagnosis not present

## 2024-01-22 DIAGNOSIS — J849 Interstitial pulmonary disease, unspecified: Secondary | ICD-10-CM | POA: Diagnosis not present

## 2024-01-22 DIAGNOSIS — I69392 Facial weakness following cerebral infarction: Secondary | ICD-10-CM | POA: Diagnosis not present

## 2024-01-22 DIAGNOSIS — I69393 Ataxia following cerebral infarction: Secondary | ICD-10-CM | POA: Diagnosis not present

## 2024-01-29 ENCOUNTER — Ambulatory Visit: Attending: Cardiology

## 2024-01-29 ENCOUNTER — Telehealth: Payer: Self-pay

## 2024-01-29 DIAGNOSIS — I639 Cerebral infarction, unspecified: Secondary | ICD-10-CM | POA: Diagnosis not present

## 2024-01-29 DIAGNOSIS — I69354 Hemiplegia and hemiparesis following cerebral infarction affecting left non-dominant side: Secondary | ICD-10-CM | POA: Diagnosis not present

## 2024-01-29 DIAGNOSIS — I130 Hypertensive heart and chronic kidney disease with heart failure and stage 1 through stage 4 chronic kidney disease, or unspecified chronic kidney disease: Secondary | ICD-10-CM | POA: Diagnosis not present

## 2024-01-29 DIAGNOSIS — I69392 Facial weakness following cerebral infarction: Secondary | ICD-10-CM | POA: Diagnosis not present

## 2024-01-29 DIAGNOSIS — I63212 Cerebral infarction due to unspecified occlusion or stenosis of left vertebral arteries: Secondary | ICD-10-CM

## 2024-01-29 DIAGNOSIS — J849 Interstitial pulmonary disease, unspecified: Secondary | ICD-10-CM | POA: Diagnosis not present

## 2024-01-29 DIAGNOSIS — I4729 Other ventricular tachycardia: Secondary | ICD-10-CM

## 2024-01-29 DIAGNOSIS — I69393 Ataxia following cerebral infarction: Secondary | ICD-10-CM | POA: Diagnosis not present

## 2024-01-29 DIAGNOSIS — I440 Atrioventricular block, first degree: Secondary | ICD-10-CM

## 2024-01-29 DIAGNOSIS — I69322 Dysarthria following cerebral infarction: Secondary | ICD-10-CM | POA: Diagnosis not present

## 2024-01-29 DIAGNOSIS — I4891 Unspecified atrial fibrillation: Secondary | ICD-10-CM

## 2024-01-29 NOTE — Telephone Encounter (Signed)
 Labs and CXR ordered and pt aware.

## 2024-01-30 ENCOUNTER — Ambulatory Visit (INDEPENDENT_AMBULATORY_CARE_PROVIDER_SITE_OTHER)
Admission: RE | Admit: 2024-01-30 | Discharge: 2024-01-30 | Disposition: A | Discharge status: Home / Self Care | Source: Ambulatory Visit | Attending: Cardiology | Admitting: Cardiology

## 2024-01-30 ENCOUNTER — Encounter: Attending: Registered Nurse | Admitting: Physical Medicine and Rehabilitation

## 2024-01-30 DIAGNOSIS — R059 Cough, unspecified: Secondary | ICD-10-CM | POA: Diagnosis not present

## 2024-01-30 DIAGNOSIS — E7849 Other hyperlipidemia: Secondary | ICD-10-CM | POA: Insufficient documentation

## 2024-01-30 DIAGNOSIS — I609 Nontraumatic subarachnoid hemorrhage, unspecified: Secondary | ICD-10-CM | POA: Insufficient documentation

## 2024-01-30 DIAGNOSIS — I4729 Other ventricular tachycardia: Secondary | ICD-10-CM

## 2024-01-30 DIAGNOSIS — I4891 Unspecified atrial fibrillation: Secondary | ICD-10-CM | POA: Diagnosis not present

## 2024-01-30 DIAGNOSIS — I6381 Other cerebral infarction due to occlusion or stenosis of small artery: Secondary | ICD-10-CM | POA: Insufficient documentation

## 2024-01-30 DIAGNOSIS — I10 Essential (primary) hypertension: Secondary | ICD-10-CM | POA: Insufficient documentation

## 2024-01-30 DIAGNOSIS — J984 Other disorders of lung: Secondary | ICD-10-CM | POA: Diagnosis not present

## 2024-01-31 ENCOUNTER — Ambulatory Visit: Payer: Self-pay | Admitting: Cardiology

## 2024-01-31 ENCOUNTER — Encounter: Payer: Self-pay | Admitting: Cardiology

## 2024-01-31 ENCOUNTER — Ambulatory Visit: Attending: Cardiology | Admitting: Cardiology

## 2024-01-31 VITALS — BP 138/70 | HR 66 | Ht 73.0 in | Wt 193.2 lb

## 2024-01-31 DIAGNOSIS — I480 Paroxysmal atrial fibrillation: Secondary | ICD-10-CM | POA: Diagnosis not present

## 2024-01-31 DIAGNOSIS — I10 Essential (primary) hypertension: Secondary | ICD-10-CM | POA: Insufficient documentation

## 2024-01-31 DIAGNOSIS — I639 Cerebral infarction, unspecified: Secondary | ICD-10-CM | POA: Insufficient documentation

## 2024-01-31 DIAGNOSIS — E7800 Pure hypercholesterolemia, unspecified: Secondary | ICD-10-CM | POA: Insufficient documentation

## 2024-01-31 DIAGNOSIS — Z951 Presence of aortocoronary bypass graft: Secondary | ICD-10-CM | POA: Diagnosis not present

## 2024-01-31 DIAGNOSIS — Z9861 Coronary angioplasty status: Secondary | ICD-10-CM | POA: Diagnosis not present

## 2024-01-31 DIAGNOSIS — I2510 Atherosclerotic heart disease of native coronary artery without angina pectoris: Secondary | ICD-10-CM | POA: Diagnosis not present

## 2024-01-31 DIAGNOSIS — E785 Hyperlipidemia, unspecified: Secondary | ICD-10-CM | POA: Diagnosis not present

## 2024-01-31 LAB — COMPREHENSIVE METABOLIC PANEL WITH GFR
ALT: 23 IU/L (ref 0–44)
AST: 27 IU/L (ref 0–40)
Albumin: 4.4 g/dL (ref 3.7–4.7)
Alkaline Phosphatase: 82 IU/L (ref 48–129)
BUN/Creatinine Ratio: 12 (ref 10–24)
BUN: 20 mg/dL (ref 8–27)
Bilirubin Total: 0.5 mg/dL (ref 0.0–1.2)
CO2: 21 mmol/L (ref 20–29)
Calcium: 9.4 mg/dL (ref 8.6–10.2)
Chloride: 102 mmol/L (ref 96–106)
Creatinine, Ser: 1.64 mg/dL — ABNORMAL HIGH (ref 0.76–1.27)
Globulin, Total: 2 g/dL (ref 1.5–4.5)
Glucose: 104 mg/dL — ABNORMAL HIGH (ref 70–99)
Potassium: 4 mmol/L (ref 3.5–5.2)
Sodium: 139 mmol/L (ref 134–144)
Total Protein: 6.4 g/dL (ref 6.0–8.5)
eGFR: 41 mL/min/1.73 — ABNORMAL LOW (ref >59)

## 2024-01-31 LAB — CBC WITH DIFFERENTIAL/PLATELET
Basophils Absolute: 0.1 x10E3/uL (ref 0.0–0.2)
Basos: 1 %
EOS (ABSOLUTE): 0.1 x10E3/uL (ref 0.0–0.4)
Eos: 2 %
Hematocrit: 39.7 % (ref 37.5–51.0)
Hemoglobin: 13.1 g/dL (ref 13.0–17.7)
Immature Grans (Abs): 0 x10E3/uL (ref 0.0–0.1)
Immature Granulocytes: 0 %
Lymphocytes Absolute: 1 x10E3/uL (ref 0.7–3.1)
Lymphs: 18 %
MCH: 31.8 pg (ref 26.6–33.0)
MCHC: 33 g/dL (ref 31.5–35.7)
MCV: 96 fL (ref 79–97)
Monocytes Absolute: 0.5 x10E3/uL (ref 0.1–0.9)
Monocytes: 9 %
Neutrophils Absolute: 3.7 x10E3/uL (ref 1.4–7.0)
Neutrophils: 70 %
Platelets: 177 x10E3/uL (ref 150–450)
RBC: 4.12 x10E6/uL — ABNORMAL LOW (ref 4.14–5.80)
RDW: 12.6 % (ref 11.6–15.4)
WBC: 5.4 x10E3/uL (ref 3.4–10.8)

## 2024-01-31 LAB — TSH: TSH: 1.66 u[IU]/mL (ref 0.450–4.500)

## 2024-01-31 LAB — MAGNESIUM: Magnesium: 2 mg/dL (ref 1.6–2.3)

## 2024-01-31 MED ORDER — AMIODARONE HCL 200 MG PO TABS: ORAL_TABLET | ORAL | 3 refills | Status: DC

## 2024-01-31 NOTE — Progress Notes (Signed)
 Cardiology Office Note:    Date:  01/31/2024   ID:  Daniel Herrera, DOB 04/27/1940, MRN [id number]  PCP:  Erick Greig LABOR, NP  Cardiologist:  Jennifer JONELLE Crape, MD   Referring MD: Erick Greig LABOR, NP    ASSESSMENT:    1. CAD S/P percutaneous coronary angioplasty   2. Cerebellar stroke (HCC)   3. Essential hypertension   4. PAF (paroxysmal atrial fibrillation) (HCC)   5. S/P CABG x 3   6. High cholesterol   7. Dyslipidemia, goal LDL below 70    PLAN:    In order of problems listed above:  Coronary artery disease: Secondary prevention stressed with the patient.  Importance of compliance with diet medication stressed and patient verbalized standing.  He was advised to ambulate to the best of his ability. Paroxysmal atrial fibrillation: Now in sinus rhythm.  I discussed diagnosis with him at extensive length.  I will initiate him on amiodarone  therapy.  Benefits and potential risks were explained and he vocalized understanding.  Questions were answered to satisfaction.  I have sent a message to his neurologist who recently saw him and your patient in outpatient follow-up to see if it is okay to initiate anticoagulation for him.  I do not personally see any significant contraindications but I would like to get out neurology colleagues opinion.  We might have to weigh in on the dose of anticoagulation because of renal insufficiency issues and age. Essential hypertension: Blood pressure is stable and diet was emphasized. Mixed dyslipidemia: On lipid-lowering medications followed by primary care.  Diet emphasized. Patient will be seen in follow-up appointment in 4 weeks or earlier if the patient has any concerns.  Results of blood test done yesterday were discussed with the patient.    Medication Adjustments/Labs and Tests Ordered: Current medicines are reviewed at length with the patient today.  Concerns regarding medicines are outlined above.  No orders of the defined types were placed in  this encounter.  Meds ordered this encounter  Medications   amiodarone  (PACERONE ) 200 MG tablet    Sig: Take 400 mg (2 tablets) daily for 2 weeks then decrease to 200 mg (1 tablet) daily.    Dispense:  104 tablet    Refill:  3     No chief complaint on file.    History of Present Illness:    Daniel Herrera is a 83 y.o. male patient is a pleasant 83 year old male.  He has past medical history of coronary artery disease, essential hypertension, mixed dyslipidemia and history of stroke.  He denies any problems at this time and takes care of activities of daily living.  He ambulates with a cane.  Since hospital discharge he has not had a fall.  He is careful and stable on his gait.  At the time of my evaluation, the patient is alert awake oriented and in no distress.  He is event monitor has revealed significant burden of atrial fibrillation.  Past Medical History:  Diagnosis Date   Abnormal EKG 02/21/2015   Acute lower GI bleeding 05/28/2019   Anginal pain 2016   Angio-edema    Anxiety    Arthritis    CAD S/P percutaneous coronary angioplasty    NSTEMI treated with LAD PCI with DES x 03 Feb 2015 Myoview low risk Nov 2017   Cataract    Cerebellar stroke (HCC) 06/14/2021   CHF (congestive heart failure) (HCC) 2020   after surgery only   Chronic diarrhea of unknown  origin    CKD (chronic kidney disease) stage 3, GFR 30-59 ml/min 12/21/2017   Colitis 05/23/2015   Coronary artery disease 05/28/2019   CVA (cerebral vascular accident) (HCC) 11/27/2023   Diverticular hemorrhage    Diverticulosis    Dyslipidemia, goal LDL below 70 02/21/2015   hyperlipidemia    Dysrhythmia    A-fib   Enterotoxigenic Escherichia coli infection 05/24/2015   Essential hypertension 02/21/2015   Geographic tongue    GERD (gastroesophageal reflux disease)    GERD without esophagitis 06/15/2021   GI bleed 12/21/2017   Glossitis, benign migratory 12/04/2018   Gout    Hematochezia 12/21/2017    Admitted Sept 2019 with lower GI bleeding felt to be secondary to hemorrhoids   Hereditary angioedema type 2 (HCC) 07/28/2017   High cholesterol    History of kidney stones    History of non-ST elevation myocardial infarction (NSTEMI) 02/22/2015   Nov 2016   History of prolonged Q-T interval on ECG    History of prostate cancer    Hoarseness 12/04/2018   Hx of Clostridium difficile infection    Hx of umbilical hernia repair    Hypertension    Hypothyroidism    Hypothyroidism, unspecified 06/15/2021   ILD (interstitial lung disease) (HCC) 12/17/2013   ?related to chicken farm exposure or aspiration pneumonitis from GERD Previously attributed to acute interstitial pneumonitis but based on HRCT this is likely IPF, probable UIP   Lower GI bleed 06/03/2019   Malignant neoplasm of prostate (HCC) 08/07/2014   Myocardial infarction [facility]) 2016   NSTEMI (non-ST elevated myocardial infarction) (HCC) 05/21/2018   Open metatarsal fracture 09/02/2020   PAF (paroxysmal atrial fibrillation) (HCC) 06/13/2018   Post CABG- discharged on Amiodarone , he will probably not need this long term   Palpitations 03/15/2015   palpitations    Peripheral vascular disease    vericose veins   Personal history of digestive disease    gastric ulcer   Pre-diabetes    Pre-operative clearance 01/02/2018   Prostate cancer (HCC)    Prostate enlargement    S/P CABG x 3 05/28/2018    LIMA-LAD, RIMA-RCA, and Lt radial to OM.   Skin cancer    basal cell carcinoma   STEC (Shiga toxin-producing Escherichia coli) infection 05/24/2015   Stroke (cerebrum) (HCC) 06/15/2021   Tachycardia determined by examination of pulse    Thyroid  disease    Type 2 diabetes mellitus with unspecified complications (HCC)    Type 2 diabetes mellitus without complications (HCC) 06/15/2021   Urticaria     Past Surgical History:  Procedure Laterality Date   APPENDECTOMY     BACK SURGERY     CARDIAC CATHETERIZATION N/A 02/21/2015    Procedure: Left Heart Cath;  Surgeon: Dorn JINNY Lesches, MD;  Location: [facility] INVASIVE CV LAB;  Service: Cardiovascular;  Laterality: N/A;   CATARACT EXTRACTION, BILATERAL     CORONARY ARTERY BYPASS GRAFT N/A 05/24/2018   Procedure: CORONARY ARTERY BYPASS GRAFTING (CABG), ON PUMP, TIMES THREE, USING BILATERAL INTERNAL MAMMARY ARTERY AND HARVESTED LEFT RADIAL ARTERY;  Surgeon: Lucas Dorise POUR, MD;  Location: [facility] OR;  Service: Open Heart Surgery;  Laterality: N/A;   CYSTOSCOPY WITH LITHOLAPAXY N/A 07/26/2020   Procedure: CYSTOSCOPY WITH LITHOLAPAXY WITH FULGERATION;  Surgeon: Renda Glance, MD;  Location: [facility] ORS;  Service: Urology;  Laterality: N/A;   HOLMIUM LASER APPLICATION N/A 07/26/2020   Procedure: HOLMIUM LASER APPLICATION;  Surgeon: Renda Glance, MD;  Location: [facility] ORS;  Service: Urology;  Laterality: N/A;  I & D EXTREMITY Right 09/02/2020   Procedure: IRRIGATION AND DEBRIDEMENT RIGHT 2ND AND 3RD METATARSAL;  Surgeon: Kit Rush, MD;  Location: [facility] OR;  Service: Orthopedics;  Laterality: Right;   LEFT HEART CATH AND CORONARY ANGIOGRAPHY N/A 05/22/2018   Procedure: LEFT HEART CATH AND CORONARY ANGIOGRAPHY;  Surgeon: Dann Candyce RAMAN, MD;  Location: [facility] INVASIVE CV LAB;  Service: Cardiovascular;  Laterality: N/A;   NASAL SINUS SURGERY     ORIF TOE FRACTURE Right 09/02/2020   Procedure: OPEN REDUCTION INTERNAL FIXATION (ORIF) OF RIGHT OPEN 2ND AND 3RD METATARSAL (TOE) OPEN FRACTURE;  Surgeon: Kit Rush, MD;  Location: [facility] OR;  Service: Orthopedics;  Laterality: Right;   PROSTATE BIOPSY     RADIAL ARTERY HARVEST Left 05/24/2018   Procedure: RADIAL ARTERY HARVEST;  Surgeon: Lucas Dorise POUR, MD;  Location: [facility] OR;  Service: Open Heart Surgery;  Laterality: Left;   right shoulder rotator cuff repair     TEE WITHOUT CARDIOVERSION N/A 05/24/2018   Procedure: TRANSESOPHAGEAL ECHOCARDIOGRAM (TEE);  Surgeon: Lucas Dorise POUR, MD;  Location: [facility] OR;  Service: Open Heart Surgery;  Laterality: N/A;    Current  Medications: Current Meds  Medication Sig   albuterol  (VENTOLIN  HFA) 108 (90 Base) MCG/ACT inhaler Inhale 2 puffs into the lungs. Every 4-6 hours as needed   amiodarone  (PACERONE ) 200 MG tablet Take 400 mg (2 tablets) daily for 2 weeks then decrease to 200 mg (1 tablet) daily.   amLODipine  (NORVASC ) 2.5 MG tablet TAKE 1 TABLET BY MOUTH EVERY DAY   artificial tears ophthalmic solution Place 1 drop into both eyes as needed for dry eyes.   aspirin  81 MG EC tablet Take 1 tablet (81 mg total) by mouth daily.   Azelastine  HCl 137 MCG/SPRAY SOLN Place 1 spray into both nostrils 2 (two) times daily.   betamethasone  valerate ointment (VALISONE ) 0.1 % Apply 1 application topically 2 (two) times daily as needed for rash.   Cyanocobalamin  (VITAMIN B-12 PO) Take 1 tablet by mouth daily.   ezetimibe  (ZETIA ) 10 MG tablet Take 1 tablet (10 mg total) by mouth daily.   fluoruracil (CARAC ) 0.5 % cream Apply to skin cancer for 1-7 days during 'flare up' as directed.   fluticasone  (FLONASE ) 50 MCG/ACT nasal spray Place 1 spray into both nostrils 2 (two) times daily.   icatibant  Acetate (FIRAZYR ) 30 MG/3ML injection Inject 30 mg into the skin once as needed (HAE).   levothyroxine  (SYNTHROID ) 100 MCG tablet Take 100 mcg by mouth daily before breakfast.   losartan  (COZAAR ) 25 MG tablet Take 1 tablet (25 mg total) by mouth daily.   metoprolol  succinate (TOPROL -XL) 25 MG 24 hr tablet Take 1 tablet (25 mg total) by mouth 2 (two) times daily.   Multiple Vitamins-Minerals (MULTIVITAMIN MEN 50+) TABS Take 1 tablet by mouth daily.   nabumetone  (RELAFEN ) 750 MG tablet Take 750 mg by mouth daily.   nitroGLYCERIN  (NITROSTAT ) 0.4 MG SL tablet Place 0.4 mg under the tongue every 5 (five) minutes as needed for chest pain.   pantoprazole  (PROTONIX ) 40 MG tablet Take 1 tablet (40 mg total) by mouth 2 (two) times daily.   Pitavastatin  Calcium  (LIVALO ) 4 MG TABS Take 4 mg by mouth at bedtime.   Probiotic Product (PROBIOTIC DAILY PO)  Take 1 capsule by mouth daily.   TAKHZYRO  300 MG/2ML SOSY INJECT 300MG  SUBCUTANEOUSLY  EVERY 2 WEEKS   temazepam  (RESTORIL ) 15 MG capsule Take 15 mg by mouth at bedtime.     Allergies:  Zestril  [lisinopril ], Buspirone , Roxicodone  [oxycodone ], Tape, Benadryl [diphenhydramine hcl], Darvocet [propoxyphene n-acetaminophen ], Darvon [propoxyphene hcl], Norvasc  [amlodipine ], and Tylenol  [acetaminophen ]   Social History   Socioeconomic History   Marital status: Married    Spouse name: Zelda   Number of children: 3   Years of education: Not on file   Highest education level: Bachelor's degree (e.g., BA, AB, BS)  Occupational History   Occupation: retired  Tobacco Use   Smoking status: Never   Smokeless tobacco: Never  Vaping Use   Vaping status: Never Used  Substance and Sexual Activity   Alcohol  use: Never   Drug use: No   Sexual activity: Not Currently  Other Topics Concern   Not on file  Social History Narrative   Not on file   Social Drivers of Health   Financial Resource Strain: Not on file  Food Insecurity: No Food Insecurity (11/25/2023)   Hunger Vital Sign    Worried About Running Out of Food in the Last Year: Never true    Ran Out of Food in the Last Year: Never true  Transportation Needs: No Transportation Needs (11/25/2023)   PRAPARE - Administrator, Civil Service (Medical): No    Lack of Transportation (Non-Medical): No  Physical Activity: Inactive (09/30/2018)   Received from [facility]   Exercise Vital Sign    On average, how many days per week do you engage in moderate to strenuous exercise (like a brisk walk)?: 0 days    On average, how many minutes do you engage in exercise at this level?: 0 min  Stress: No Stress Concern Present (09/30/2018)   Received from [facility] - Occupational Stress Questionnaire    Feeling of Stress : Not at all  Social Connections: Unknown (11/25/2023)   Social  Connection and Isolation Panel    Frequency of Communication with Friends and Family: More than three times a week    Frequency of Social Gatherings with Friends and Family: More than three times a week    Attends Religious Services: Not on Marketing Executive or Organizations: Not on file    Attends Banker Meetings: Not on file    Marital Status: Not on file     Family History: The patient's family history includes Cancer in his father, mother, and sister; Stomach cancer in his mother. There is no history of Sleep apnea.  ROS:   Please see the history of present illness.    All other systems reviewed and are negative.  EKGs/Labs/Other Studies Reviewed:    The following studies were reviewed today: I discussed event monitor findings with the patient at length   Recent Labs: 01/30/2024: ALT 23; BUN 20; Creatinine, Ser 1.64; Hemoglobin 13.1; Magnesium  2.0; Platelets 177; Potassium 4.0; Sodium 139; TSH 1.660  Recent Lipid Panel    Component Value Date/Time   CHOL 168 11/24/2023 1146   CHOL 183 05/23/2019 0826   TRIG 144 11/24/2023 1146   HDL 33 (L) 11/24/2023 1146   HDL 44 05/23/2019 0826   CHOLHDL 5.1 11/24/2023 1146   VLDL 29 11/24/2023 1146   LDLCALC 106 (H) 11/24/2023 1146   LDLCALC 108 (H) 05/23/2019 0826    Physical Exam:    VS:  BP 138/70   Pulse 66   Ht 6' 1 (1.854 m)   Wt 193 lb 3.2 oz (87.6 kg)   SpO2 97%   BMI 25.49 kg/m  Wt Readings from Last 3 Encounters:  01/31/24 193 lb 3.2 oz (87.6 kg)  01/01/24 191 lb (86.6 kg)  12/10/23 188 lb 7.9 oz (85.5 kg)     GEN: Patient is in no acute distress HEENT: Normal NECK: No JVD; No carotid bruits LYMPHATICS: No lymphadenopathy CARDIAC: Hear sounds regular, 2/6 systolic murmur at the apex. RESPIRATORY:  Clear to auscultation without rales, wheezing or rhonchi  ABDOMEN: Soft, non-tender, non-distended MUSCULOSKELETAL:  No edema; No deformity  SKIN: Warm and dry NEUROLOGIC:  Alert  and oriented x 3 PSYCHIATRIC:  Normal affect   Signed, Jennifer JONELLE Crape, MD  01/31/2024 3:32 PM    [facility]

## 2024-01-31 NOTE — Patient Instructions (Signed)
 Medication Instructions:  Your physician has recommended you make the following change in your medication:   Start Amiodarone  400 mg daily for 2 weeks then decrease to 200 mg daily.  *If you need a refill on your cardiac medications before your next appointment, please call your pharmacy*   Lab Work: None ordered If you have labs (blood work) drawn today and your tests are completely normal, you will receive your results only by: MyChart Message (if you have MyChart) OR A paper copy in the mail If you have any lab test that is abnormal or we need to change your treatment, we will call you to review the results.   Testing/Procedures: None ordered   Follow-Up: At [facility], you and your health needs are our priority.  As part of our continuing mission to provide you with exceptional heart care, we have created designated Provider Care Teams.  These Care Teams include your primary Cardiologist (physician) and Advanced Practice Providers (APPs -  Physician Assistants and Nurse Practitioners) who all work together to provide you with the care you need, when you need it.  We recommend signing up for the patient portal called MyChart.  Sign up information is provided on this After Visit Summary.  MyChart is used to connect with patients for Virtual Visits (Telemedicine).  Patients are able to view lab/test results, encounter notes, upcoming appointments, etc.  Non-urgent messages can be sent to your provider as well.   To learn more about what you can do with MyChart, go to [email or web address].    Your next appointment:   1 month(s)  The format for your next appointment:   In Person  Provider:   Jennifer Crape, MD    Other Instructions none  Important Information About Sugar

## 2024-02-04 NOTE — Telephone Encounter (Signed)
Results reviewed with pt as per Dr. Revankar's note.  Pt verbalized understanding and had no additional questions. Routed to PCP.  

## 2024-02-05 ENCOUNTER — Ambulatory Visit: Admitting: Neurology

## 2024-02-05 ENCOUNTER — Encounter: Payer: Self-pay | Admitting: Neurology

## 2024-02-05 VITALS — BP 166/67 | HR 63 | Ht 73.0 in | Wt 198.5 lb

## 2024-02-05 DIAGNOSIS — I480 Paroxysmal atrial fibrillation: Secondary | ICD-10-CM | POA: Diagnosis not present

## 2024-02-05 DIAGNOSIS — G629 Polyneuropathy, unspecified: Secondary | ICD-10-CM | POA: Diagnosis not present

## 2024-02-05 DIAGNOSIS — I6381 Other cerebral infarction due to occlusion or stenosis of small artery: Secondary | ICD-10-CM | POA: Diagnosis not present

## 2024-02-05 MED ORDER — LIDOCAINE-PRILOCAINE 2.5-2.5 % EX CREA: 1.0000 | TOPICAL_CREAM | CUTANEOUS | 0 refills | Status: AC | PRN

## 2024-02-05 NOTE — Progress Notes (Unsigned)
 [facility]  PATIENT: Daniel Herrera DOB: 28-Jul-1940  REQUESTING CLINICIAN: Jerilynn Daphne SAILOR, NP HISTORY FROM: Patient and chart review  REASON FOR VISIT: Right thalamic stroke    HISTORICAL  CHIEF COMPLAINT:  Chief Complaint  Patient presents with   RM12/STROKE    Pt is here Alone. Pt states that he has been doing good since his stroke.     HISTORY OF PRESENT ILLNESS:  Discussed the use of AI scribe software for clinical note transcription with the patient, who gave verbal consent to proceed.  Daniel Herrera is an 83 year old male with neuropathy and atrial fibrillation, hypertension, hyperlipidemia, CAD status post CABG angioedema and previous strokes who presents after being admitted for a right thalamic stroke.  Stroke etiology likely small vessel disease but cannot rule out cardioembolic from the diagnosis of A-fib.  Today he is more concerned about his intermittent leg swelling and neuropathy.  Per chart review, patient presented to the hospital for left-sided weakness causing him to fall.  In the hospital he was in the window and was given TNK.  He recovered very well, has completed PT and reported he has some mild deficit.  He did have cardiac monitor and was found to have atrial fibrillation.  His cardiologist started him on amiodarone  but he has not started the medication.  He remains on aspirin  81 mg he did have a history of previous GI bleed while on Plavix .  He has a history of neuropathy affecting both legs and feet for several years, characterized by swelling and painful 'knots' on his legs, which can be as large as eggs and appear and disappear without bruising. He uses support hose and horse liniment gel for relief, which he finds effective. His legs swell, particularly in one area, and his feet and toes are numb, especially at night. He experiences burning pain at night and a sensation of tightness in his legs.  He has a history of atrial  fibrillation, detected during a 30-day heart monitor test showing AFib 2% of the time. He is currently taking metoprolol  for heart rhythm regulation and aspirin . He has previously experienced a GI bleed with Plavix . He recently started on amiodarone  for AFib but has not yet taken it due to concerns about side effects.  He has a history of multiple strokes, with the most recent one causing left-sided weakness. He reports improvement but still experiences weakness and occasional dropping of objects with his left hand. He uses a cane, which he started using after breaking three bones in his foot. He has had six strokes prior to the most recent one.  He has a history of angioedema and takes Takhzyro  injections monthly to manage it. He also carries an Epipen  for emergencies. He has experienced angioedema in the past, which required hospitalization.  He is currently taking cholesterol medications, including Zetia  and pravastatin , after experiencing muscle pain with other statins. He has not been informed of his recent cholesterol results.     HOSPITAL COURSE AND SUMMARY  Brief HPI:   Daniel Herrera is a 83 y.o. male  with PMHx of hereditary angioedema type II, A-fib after CABG, hypertension, hyperlipidemia, CAD status post CABG, MI, stroke, CHF, CKD, GERD, GI bleed, prostate cancer, hypothyroidism and diabetes presented to the [facility] ER on 8/22 with complaints of acute onset of left-sided weakness. The patient reported that he was sitting in a chair eating a sandwich when he suddenly found himself unable to get up  to go to the bathroom. He experienced numbness and an inability to use his left leg, which led to a fall. His family promptly transported him to the emergency department. On initial evaluation, his NIH Stroke  was 9, indicating left-sided facial droop, left arm weakness, left leg weakness, ataxia in both arms, decreased sensation on the left, and dysarthria. A CT of the head showed no  acute process, and a CTA head & neck Severe left and moderate to severe right P2 PCA stenosis. Severe right M2 MCA stenosis. Moderate left A2 ACA stenosis. Chronically occluded left vertebral artery. An MRI revealed a 1.5 cm acute lacunar infarct in the right thalamus without hemorrhage or mass effect. Additionally, there was evidence of trace subarachnoid hemorrhage [facility]) at the left superior convexity. Patient was considered an appropriate candidate for TNK, which was administered. Repeat CT Head- Subtle but confirmed trace left vertex subarachnoid hemorrhage (sagittal image 43). No intracranial mass effect or other complicating features. He was placed on Asprin alone due to prior hx of bleeding on Plavix . 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 He currently requires moderate assistance with mobility and basic activities of daily living (ADLs). Therapy evaluations completed due to patient decreased functional mobility was admitted for a comprehensive rehab program.    Hospital Course: Loyd C Goldston was admitted to rehab 11/27/2023 for inpatient therapies to consist of PT, ST and OT at least three hours five days a week. Past admission physiatrist, therapy team and rehab RN have worked together to provide customized collaborative inpatient rehab. He was maintained on aspirin  as there was hx of GI bleed on plavix .   Mr. Martis was admitted to inpatient rehab due to functional deficits following a right thalamic infarct and small subarachnoid hemorrhages post-coronary artery bypass graft. He demonstrated significant neurological improvement but retained sensory perceptive deficits and ataxia. His arthritis and left shoulder pain were managed effectively with pain control measures. He continued taking temazepam  for insomnia. During his in-patient stay, he experienced an episode of hereditary angioedema, which was treated with Icatibant  as  needed. Otherwise no episodes of angioedema in rehabilitation.  For hyperlipidemia, he was maintained on pitavastatin  4 mg daily and Zetia .    Blood pressures were monitored TID. His hypertension was well controlled, though he experienced a brief episode of hypotension that resolved quickly, and he remained on Norvasc  and losartan . Diabetes has been monitored with ac/hs CBG checks and SSI was use prn for tighter BS control. Diabetic teaching was completed. His type 2 diabetes was managed with stable blood sugar levels, reflected in an A1C of 5.6. Post-operative atrial fibrillation after CABG was controlled with metoprolol  daily. For CKD stage 3B, BUN and creatinine levels improved with oral hydration, though creatinine was slightly elevated at discharge, and he was advised to stay hydrated. His hypothyroidism was managed with levothyroxine . Despite a sleep study and initiation of CPAP for obstructive sleep apnea, he declined using CPAP, insisting intolerance. He will be followed up outpatient for sleep apnea. Constipation was managed with docusate, leading to regular bowel movements .On 8/28 patient complained of left facial numbness, worsening left side weakness, LUE to shoulder numbness and pain while working with therapy. CT head showed no new changes from prior, neurology evaluated no new orders obtained. Advised to continue current treatment. He is discharging one day early due to family transportation arrangements.    Rehab course: During patient's stay in rehab weekly team conferences  were held to monitor patient's progress, set goals and discuss barriers to discharge. At admission, patient required moderate assistance with mobility and basic activities of daily living (ADLs). Mr. Virgin made good progress in occupational therapy, meeting all long-term goals for functional use of his left upper and lower extremities, and will benefit from ongoing skilled OT. He transitioned from requiring contact  guard assistance to needing only close supervision. In physical therapy, he was able to ambulate over 200 feet with ankle weights, requiring contact guard assist, minimal assistance, and cues for left lower extremity foot clearance. He met all long-term goals in physical therapy and will discharge at an ambulatory level supervision, benefiting from ongoing skilled PT and OT outpatient. He has had improvement in activity tolerance, balance, postural control as well as ability to compensate for deficits. He has had improvement in functional use LUE and LLE as well as improvement in awareness   Family teaching completed upon discharge to home with son in-law Evalene Scrape. Medications have been returned from pharmacy to the patient at discharge.  OTHER MEDICAL CONDITIONS: Hypertension, hyperlipidemia, CAD status post CABG, CKD, angioedema, previous stroke, peripheral neuropathy   REVIEW OF SYSTEMS: Full 14 system review of systems performed and negative with exception of: As noted in HPI  ALLERGIES: Allergies  Allergen Reactions   Zestril  [Lisinopril ] Anaphylaxis and Swelling    Face, lips, and throat swell   Buspirone  Other (See Comments)    Didn't like how it made him feel   Roxicodone  [Oxycodone ] Other (See Comments)    Hallucinations   Tape Other (See Comments)    PLASTIC TAPE TEARS OFF THE SKIN- Please use an alternative!!   Benadryl [Diphenhydramine Hcl] Swelling and Other (See Comments)    Facial swelling from high dose (tolerates Advil  PM as needed)   Darvocet [Propoxyphene N-Acetaminophen ] Other (See Comments)    Hallucinations    Darvon [Propoxyphene Hcl] Other (See Comments)    Hallucination    Norvasc  [Amlodipine ] Palpitations, Rash and Other (See Comments)    Heart pounding Headache  Rash on ankles   Tylenol  [Acetaminophen ] Swelling and Other (See Comments)    Foot swelling    HOME MEDICATIONS: Outpatient Medications Prior to Visit  Medication Sig Dispense Refill    albuterol  (VENTOLIN  HFA) 108 (90 Base) MCG/ACT inhaler Inhale 2 puffs into the lungs. Every 4-6 hours as needed     amiodarone  (PACERONE ) 200 MG tablet Take 400 mg (2 tablets) daily for 2 weeks then decrease to 200 mg (1 tablet) daily. 104 tablet 3   amLODipine  (NORVASC ) 2.5 MG tablet TAKE 1 TABLET BY MOUTH EVERY DAY 90 tablet 2   artificial tears ophthalmic solution Place 1 drop into both eyes as needed for dry eyes. 15 mL 0   aspirin  81 MG EC tablet Take 1 tablet (81 mg total) by mouth daily. 30 tablet 11   Azelastine  HCl 137 MCG/SPRAY SOLN Place 1 spray into both nostrils 2 (two) times daily.     betamethasone  valerate ointment (VALISONE ) 0.1 % Apply 1 application topically 2 (two) times daily as needed for rash.     Cyanocobalamin  (VITAMIN B-12 PO) Take 1 tablet by mouth daily.     ezetimibe  (ZETIA ) 10 MG tablet Take 1 tablet (10 mg total) by mouth daily. 30 tablet 0   fluoruracil (CARAC ) 0.5 % cream Apply to skin cancer for 1-7 days during 'flare up' as directed. 30 g 3   fluticasone  (FLONASE ) 50 MCG/ACT nasal spray Place 1 spray into both nostrils  2 (two) times daily.     icatibant  Acetate (FIRAZYR ) 30 MG/3ML injection Inject 30 mg into the skin once as needed (HAE).     levothyroxine  (SYNTHROID ) 100 MCG tablet Take 100 mcg by mouth daily before breakfast.     losartan  (COZAAR ) 25 MG tablet Take 1 tablet (25 mg total) by mouth daily. 90 tablet 1   metoprolol  succinate (TOPROL -XL) 25 MG 24 hr tablet Take 1 tablet (25 mg total) by mouth 2 (two) times daily. 60 tablet 0   Multiple Vitamins-Minerals (MULTIVITAMIN MEN 50+) TABS Take 1 tablet by mouth daily.     nabumetone  (RELAFEN ) 750 MG tablet Take 750 mg by mouth daily.     nitroGLYCERIN  (NITROSTAT ) 0.4 MG SL tablet Place 0.4 mg under the tongue every 5 (five) minutes as needed for chest pain.     pantoprazole  (PROTONIX ) 40 MG tablet Take 1 tablet (40 mg total) by mouth 2 (two) times daily. 60 tablet 0   Pitavastatin  Calcium  (LIVALO ) 4 MG TABS  Take 4 mg by mouth at bedtime.     Probiotic Product (PROBIOTIC DAILY PO) Take 1 capsule by mouth daily.     TAKHZYRO  300 MG/2ML SOSY INJECT 300MG  SUBCUTANEOUSLY  EVERY 2 WEEKS 4 mL 11   temazepam  (RESTORIL ) 15 MG capsule Take 15 mg by mouth at bedtime.     No facility-administered medications prior to visit.    PAST MEDICAL HISTORY: Past Medical History:  Diagnosis Date   Abnormal EKG 02/21/2015   Acute lower GI bleeding 05/28/2019   Anginal pain 2016   Angio-edema    Anxiety    Arthritis    CAD S/P percutaneous coronary angioplasty    NSTEMI treated with LAD PCI with DES x 03 Feb 2015 Myoview low risk Nov 2017   Cataract    Cerebellar stroke (HCC) 06/14/2021   Cerebellar stroke (HCC) 11/2023   CHF (congestive heart failure) (HCC) 2020   after surgery only   Chronic diarrhea of unknown origin    CKD (chronic kidney disease) stage 3, GFR 30-59 ml/min 12/21/2017   Colitis 05/23/2015   Coronary artery disease 05/28/2019   CVA (cerebral vascular accident) (HCC) 11/27/2023   Diverticular hemorrhage    Diverticulosis    Dyslipidemia, goal LDL below 70 02/21/2015   hyperlipidemia    Dysrhythmia    A-fib   Enterotoxigenic Escherichia coli infection 05/24/2015   Essential hypertension 02/21/2015   Geographic tongue    GERD (gastroesophageal reflux disease)    GERD without esophagitis 06/15/2021   GI bleed 12/21/2017   Glossitis, benign migratory 12/04/2018   Gout    Hematochezia 12/21/2017   Admitted Sept 2019 with lower GI bleeding felt to be secondary to hemorrhoids   Hereditary angioedema type 2 (HCC) 07/28/2017   High cholesterol    History of kidney stones    History of non-ST elevation myocardial infarction (NSTEMI) 02/22/2015   Nov 2016   History of prolonged Q-T interval on ECG    History of prostate cancer    Hoarseness 12/04/2018   Hx of Clostridium difficile infection    Hx of umbilical hernia repair    Hypertension    Hypothyroidism    Hypothyroidism,  unspecified 06/15/2021   ILD (interstitial lung disease) (HCC) 12/17/2013   ?related to chicken farm exposure or aspiration pneumonitis from GERD Previously attributed to acute interstitial pneumonitis but based on HRCT this is likely IPF, probable UIP   Lower GI bleed 06/03/2019   Malignant neoplasm of prostate (HCC) 08/07/2014  Myocardial infarction [facility]) 2016   NSTEMI (non-ST elevated myocardial infarction) (HCC) 05/21/2018   Open metatarsal fracture 09/02/2020   PAF (paroxysmal atrial fibrillation) (HCC) 06/13/2018   Post CABG- discharged on Amiodarone , he will probably not need this long term   Palpitations 03/15/2015   palpitations    Peripheral vascular disease    vericose veins   Personal history of digestive disease    gastric ulcer   Pre-diabetes    Pre-operative clearance 01/02/2018   Prostate cancer (HCC)    Prostate enlargement    S/P CABG x 3 05/28/2018    LIMA-LAD, RIMA-RCA, and Lt radial to OM.   Skin cancer    basal cell carcinoma   STEC (Shiga toxin-producing Escherichia coli) infection 05/24/2015   Stroke (cerebrum) (HCC) 06/15/2021   Tachycardia determined by examination of pulse    Thyroid  disease    Type 2 diabetes mellitus with unspecified complications (HCC)    Type 2 diabetes mellitus without complications (HCC) 06/15/2021   Urticaria     PAST SURGICAL HISTORY: Past Surgical History:  Procedure Laterality Date   APPENDECTOMY     BACK SURGERY     CARDIAC CATHETERIZATION N/A 02/21/2015   Procedure: Left Heart Cath;  Surgeon: Dorn JINNY Lesches, MD;  Location: [facility] INVASIVE CV LAB;  Service: Cardiovascular;  Laterality: N/A;   CATARACT EXTRACTION, BILATERAL     CORONARY ARTERY BYPASS GRAFT N/A 05/24/2018   Procedure: CORONARY ARTERY BYPASS GRAFTING (CABG), ON PUMP, TIMES THREE, USING BILATERAL INTERNAL MAMMARY ARTERY AND HARVESTED LEFT RADIAL ARTERY;  Surgeon: Lucas Dorise POUR, MD;  Location: [facility] OR;  Service: Open Heart Surgery;  Laterality: N/A;    CYSTOSCOPY WITH LITHOLAPAXY N/A 07/26/2020   Procedure: CYSTOSCOPY WITH LITHOLAPAXY WITH FULGERATION;  Surgeon: Renda Glance, MD;  Location: [facility] ORS;  Service: Urology;  Laterality: N/A;   HOLMIUM LASER APPLICATION N/A 07/26/2020   Procedure: HOLMIUM LASER APPLICATION;  Surgeon: Renda Glance, MD;  Location: [facility] ORS;  Service: Urology;  Laterality: N/A;   I & D EXTREMITY Right 09/02/2020   Procedure: IRRIGATION AND DEBRIDEMENT RIGHT 2ND AND 3RD METATARSAL;  Surgeon: Kit Rush, MD;  Location: [facility] OR;  Service: Orthopedics;  Laterality: Right;   LEFT HEART CATH AND CORONARY ANGIOGRAPHY N/A 05/22/2018   Procedure: LEFT HEART CATH AND CORONARY ANGIOGRAPHY;  Surgeon: Dann Candyce RAMAN, MD;  Location: [facility] INVASIVE CV LAB;  Service: Cardiovascular;  Laterality: N/A;   NASAL SINUS SURGERY     ORIF TOE FRACTURE Right 09/02/2020   Procedure: OPEN REDUCTION INTERNAL FIXATION (ORIF) OF RIGHT OPEN 2ND AND 3RD METATARSAL (TOE) OPEN FRACTURE;  Surgeon: Kit Rush, MD;  Location: [facility] OR;  Service: Orthopedics;  Laterality: Right;   PROSTATE BIOPSY     RADIAL ARTERY HARVEST Left 05/24/2018   Procedure: RADIAL ARTERY HARVEST;  Surgeon: Lucas Dorise POUR, MD;  Location: [facility] OR;  Service: Open Heart Surgery;  Laterality: Left;   right shoulder rotator cuff repair     TEE WITHOUT CARDIOVERSION N/A 05/24/2018   Procedure: TRANSESOPHAGEAL ECHOCARDIOGRAM (TEE);  Surgeon: Lucas Dorise POUR, MD;  [facility];  Service: Open Heart Surgery;  Laterality: N/A;    FAMILY HISTORY: Family History  Problem Relation Age of Onset   Cancer Mother    Stomach cancer Mother    Cancer Father        prostate   Cancer Sister    Sleep apnea Neg Hx     SOCIAL HISTORY: Social History   Socioeconomic History   Marital status: Married  Spouse name: Zelda   Number of children: 3   Years of education: Not on file   Highest education level: Bachelor's degree (e.g., BA, AB, BS)  Occupational History   Occupation: retired   Tobacco Use   Smoking status: Never   Smokeless tobacco: Never  Vaping Use   Vaping status: Never Used  Substance and Sexual Activity   Alcohol  use: Never   Drug use: No   Sexual activity: Not Currently  Other Topics Concern   Not on file  Social History Narrative   Not on file   Social Drivers of Health   Financial Resource Strain: Not on file  Food Insecurity: No Food Insecurity (11/25/2023)   Hunger Vital Sign    Worried About Running Out of Food in the Last Year: Never true    Ran Out of Food in the Last Year: Never true  Transportation Needs: No Transportation Needs (11/25/2023)   PRAPARE - Administrator, Civil Service (Medical): No    Lack of Transportation (Non-Medical): No  Physical Activity: Inactive (09/30/2018)   Received from [facility]   Exercise Vital Sign    On average, how many days per week do you engage in moderate to strenuous exercise (like a brisk walk)?: 0 days    On average, how many minutes do you engage in exercise at this level?: 0 min  Stress: No Stress Concern Present (09/30/2018)   Received from [facility] - Occupational Stress Questionnaire    Feeling of Stress : Not at all  Social Connections: Unknown (11/25/2023)   Social Connection and Isolation Panel    Frequency of Communication with Friends and Family: More than three times a week    Frequency of Social Gatherings with Friends and Family: More than three times a week    Attends Religious Services: Not on file    Active Member of Clubs or Organizations: Not on file    Attends Banker Meetings: Not on file    Marital Status: Not on file  Intimate Partner Violence: Not At Risk (11/25/2023)   Humiliation, Afraid, Rape, and Kick questionnaire    Fear of Current or Ex-Partner: No    Emotionally Abused: No    Physically Abused: No    Sexually Abused: No    PHYSICAL EXAM  GENERAL EXAM/CONSTITUTIONAL: Vitals:   Vitals:   02/05/24 1341  BP: (!) 166/67  Pulse: 63  SpO2: 98%  Weight: 198 lb 8 oz (90 kg)  Height: 6' 1 (1.854 m)   Body mass index is 26.19 kg/m. Wt Readings from Last 3 Encounters:  02/05/24 198 lb 8 oz (90 kg)  01/31/24 193 lb 3.2 oz (87.6 kg)  01/01/24 191 lb (86.6 kg)   Patient is in no distress; well developed, nourished and groomed; neck is supple  MUSCULOSKELETAL: Gait, strength, tone, movements noted in Neurologic exam below  NEUROLOGIC: MENTAL STATUS:      No data to display         awake, alert, oriented to person, place and time recent and remote memory intact normal attention and concentration language fluent, comprehension intact, naming intact fund of knowledge appropriate  CRANIAL NERVE:  2nd, 3rd, 4th, 6th - Visual fields full midshin on the left and ankle on the right confrontation, extraocular muscles intact, no nystagmus 5th - facial sensation symmetric 7th - facial strength symmetric 8th - hearing intact 9th - palate elevates symmetrically, uvula midline 11th -  shoulder shrug symmetric 12th - tongue protrusion midline  MOTOR:  normal bulk and tone, full strength in the BUE, BLE  SENSORY:  Ankle inconsistent proprioception, normal light touch, decreased pinprick up to mid shin on the right and ankle on the left, decreased vibration up to knee on the right ankle on the left  COORDINATION:  finger-nose-finger, fine finger movements normal  GAIT/STATION:  Does ambulate with a cane  DIAGNOSTIC DATA (LABS, IMAGING, TESTING) - I reviewed patient records, labs, notes, testing and imaging myself where available.  Lab Results  Component Value Date   WBC 5.4 01/30/2024   HGB 13.1 01/30/2024   HCT 39.7 01/30/2024   MCV 96 01/30/2024   PLT 177 01/30/2024      Component Value Date/Time   NA 139 01/30/2024 0934   K 4.0 01/30/2024 0934   CL 102 01/30/2024 0934   CO2 21 01/30/2024 0934   GLUCOSE 104 (H) 01/30/2024 0934   GLUCOSE 111 (H)  12/10/2023 0509   BUN 20 01/30/2024 0934   CREATININE 1.64 (H) 01/30/2024 0934   CREATININE 1.29 (H) 04/02/2015 1146   CALCIUM  9.4 01/30/2024 0934   PROT 6.4 01/30/2024 0934   ALBUMIN  4.4 01/30/2024 0934   AST 27 01/30/2024 0934   ALT 23 01/30/2024 0934   ALKPHOS 82 01/30/2024 0934   BILITOT 0.5 01/30/2024 0934   GFRNONAA 28 (L) 12/10/2023 0509   GFRAA 51 (L) 06/06/2019 0613   Lab Results  Component Value Date   CHOL 168 11/24/2023   HDL 33 (L) 11/24/2023   LDLCALC 106 (H) 11/24/2023   TRIG 144 11/24/2023   CHOLHDL 5.1 11/24/2023   Lab Results  Component Value Date   HGBA1C 5.6 11/23/2023   No results found for: VITAMINB12 Lab Results  Component Value Date   TSH 1.660 01/30/2024    MRI Brain 11/25/2023 1. Positive for: - 1.5 cm acute lacunar infarct of the right thalamus. No hemorrhagic transformation there. No mass effect. - evidence of trace SAH left superior convexity (possible Heidelberg classification 3c: Subarachnoid hemorrhage.). Recommend repeat noncontrast Head CT to further evaluate. 2. No intracranial mass effect. No other acute intracranial abnormality. Chronic distal left vertebral artery occlusion and patchy chronic left cerebellar infarcts  CTA Head and Neck 11/23/2023 1. Chronically occluded left vertebral artery. 2. Severe left and moderate to severe right P2 PCA stenosis. 3. Severe right M2 MCA stenosis. 4. Moderate left A2 ACA stenosis. 5.  Aortic Atherosclerosis (ICD10-I70.0).    ASSESSMENT AND PLAN  83 y.o. year old male with    Right thalamic stroke with residual left-sided weakness improved Very mild reported residual left-sided weakness persists post-stroke, with improvement in hand function but occasional dropping of objects. No new deficits. - Continue current medications. - Return to clinic as needed for new symptoms.  Peripheral neuropathy, predominantly right leg Peripheral neuropathy predominantly affects the right leg, with  numbness and occasional burning pain, especially at night. Examination reveals decreased sensation to pinprick and vibration in the right leg compared to the left. - Prescribed Emla cream (lidocaine  and prilocaine) for nighttime pain relief.  Atrial fibrillation, paroxysmal Paroxysmal atrial fibrillation detected on a 30-day heart monitor, with episodes accounting for 2% of the time. History of GI bleed with Plavix . Currently on aspirin  and metoprolol . Cardiology recommended Amiodarone  but he has not started the medication yet. Cardiology wanted to reach to us  prior to initiating anticoagulation given his bleeding risk. He previously had rectal bleed, ?diverticulosis. No recurrent bleeding. He might benefit from  anticoagulation but if the problem persist, then should consider watchman procedure.  - May benefit from anticoagulation with close monitoring for rectal bleeding - If bleeding reoccur, consider watchman procedure     1. Thalamic stroke (HCC)   2. Peripheral polyneuropathy   3. PAF (paroxysmal atrial fibrillation) [facility])      Patient Instructions  Continue current medications Please contact cardiologist regarding your concern about amiodarone  Can consider anticoagulation for his atrial fibrillation and stroke prevention. Continue to monitor for GI bleed, if it reoccur, then consider Watchman procedure.  Continue to follow with your doctors Return if worse or any new symptoms.  No orders of the defined types were placed in this encounter.   Meds ordered this encounter  Medications   lidocaine -prilocaine (EMLA) cream    Sig: Apply 1 Application topically as needed.    Dispense:  30 g    Refill:  0    Return if symptoms worsen or fail to improve.  I personally spent a total of 60 minutes in the care of the patient today including preparing to see the patient, getting/reviewing separately obtained history, performing a medically appropriate exam/evaluation, counseling and  educating, placing orders, referring and communicating with other health care professionals, and documenting clinical information in the EHR.   Pastor Falling, MD 02/06/2024, 5:04 PM  [facility] [address], [address] [place], [place] [address] [phone]

## 2024-02-05 NOTE — Patient Instructions (Signed)
 Continue current medications Please contact cardiologist regarding your concern about amiodarone  Continue to follow with your doctors Return if worse or any new symptoms.

## 2024-02-06 ENCOUNTER — Encounter: Payer: Self-pay | Admitting: Allergy and Immunology

## 2024-02-06 ENCOUNTER — Ambulatory Visit (INDEPENDENT_AMBULATORY_CARE_PROVIDER_SITE_OTHER): Admitting: Allergy and Immunology

## 2024-02-06 VITALS — BP 174/74 | HR 60 | Resp 18

## 2024-02-06 DIAGNOSIS — J849 Interstitial pulmonary disease, unspecified: Secondary | ICD-10-CM

## 2024-02-06 DIAGNOSIS — K219 Gastro-esophageal reflux disease without esophagitis: Secondary | ICD-10-CM

## 2024-02-06 DIAGNOSIS — D841 Defects in the complement system: Secondary | ICD-10-CM

## 2024-02-06 DIAGNOSIS — J3089 Other allergic rhinitis: Secondary | ICD-10-CM | POA: Diagnosis not present

## 2024-02-06 DIAGNOSIS — M3500 Sicca syndrome, unspecified: Secondary | ICD-10-CM | POA: Diagnosis not present

## 2024-02-06 NOTE — Patient Instructions (Addendum)
  1.  Continue Takhzryo every 14 days.   2.  Continue to Treat and prevent inflammation:    A.  Flonase  - 1-2 sprays each nostril once a day  3.  Continue to Treat and prevent reflux:   A.  pantoprazole  40 mg twice a day  4.  If needed:   A.  Nasal saline spray  B.  Azelastine  nose spray 1-2 times per day  C.  OTC Mucinex  DM - 2 tablets twice a day  D.  Firazyr  injection for swelling reaction  E.  Systane eye drops  5. Return to clinic in 6 months or earlier if problem  6. Influenza = Tamiflu. Covid = Paxlovid / molnupiravir

## 2024-02-06 NOTE — Progress Notes (Unsigned)
 [place] - [place] - [place] - [place] - [place]   Follow-up Note  Referring Provider: Erick Greig LABOR, NP Primary Provider: Erick Greig LABOR, NP Date of Office Visit: 02/06/2024  Subjective:   Daniel Herrera (DOB: 1940-08-17) is a 83 y.o. 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  I last saw him in this clinic 09 Aug 2023.  He has not had any swelling episodes and has not had to use any icatibant .  He continues on his Taxol Breo injections every 2 weeks.  His nose is doing well while using a nasal steroid and his reflux is doing well using a proton pump inhibitor.  He has had very little problems with his airway.  He apparently had an embolic stroke because of intermittent atrial fibrillation.  He tells me that he had a several week program of rehabilitation to deal with his left-sided paralysis and he is improved significantly and can use all his limbs.  It sounds as though he might of received tPA at the point of his stroke presentation.  He is now on amiodarone  and there will be a decision about starting a blood thinner at some point in the near future after consultation with his neurologist and cardiologist.  He did receive this years flu vaccine.  Allergies as of 02/06/2024       Reactions   Zestril  [lisinopril ] Anaphylaxis, Swelling   Face, lips, and throat swell   Buspirone  Other (See Comments)   Didn't like how it made him feel   Roxicodone  [oxycodone ] Other (See Comments)   Hallucinations   Tape Other (See Comments)   PLASTIC TAPE TEARS OFF THE SKIN- Please use an alternative!!   Benadryl [diphenhydramine Hcl] Swelling, Other (See Comments)   Facial swelling from high dose (tolerates Advil  PM as needed)   Darvocet  [propoxyphene N-acetaminophen ] Other (See Comments)   Hallucinations   Darvon [propoxyphene Hcl] Other (See Comments)   Hallucination   Norvasc  [amlodipine ] Palpitations, Rash, Other (See Comments)   Heart pounding Headache  Rash on ankles   Tylenol  [acetaminophen ] Swelling, Other (See Comments)   Foot swelling        Medication List    albuterol  108 (90 Base) MCG/ACT inhaler Commonly known as: VENTOLIN  HFA Inhale 2 puffs into the lungs. Every 4-6 hours as needed   amiodarone  200 MG tablet Commonly known as: PACERONE  Take 400 mg (2 tablets) daily for 2 weeks then decrease to 200 mg (1 tablet) daily.   amLODipine  2.5 MG tablet Commonly known as: NORVASC  TAKE 1 TABLET BY MOUTH EVERY DAY   artificial tears ophthalmic solution Place 1 drop into both eyes as needed for dry eyes.   aspirin  EC 81 MG tablet Take 1 tablet (81 mg total) by mouth daily.   Azelastine  HCl 137 MCG/SPRAY Soln Place 1 spray into both nostrils 2 (two) times daily.   betamethasone  valerate ointment 0.1 % Commonly known as: VALISONE  Apply 1 application topically 2 (two) times daily as needed for rash.   ezetimibe  10 MG tablet Commonly known as: ZETIA  Take 1 tablet (10 mg total) by mouth daily.   fluoruracil 0.5 % cream Commonly known as: CARAC  Apply to skin cancer for 1-7 days during 'flare up' as directed.   fluticasone  50 MCG/ACT nasal spray  Commonly known as: FLONASE  Place 1 spray into both nostrils 2 (two) times daily.   icatibant  Acetate 30 MG/3ML injection Commonly known as: FIRAZYR  Inject 30 mg into the skin once as needed (HAE).   levothyroxine  100 MCG tablet Commonly known as: SYNTHROID  Take 100 mcg by mouth daily before breakfast.   lidocaine -prilocaine cream Commonly known as: EMLA Apply 1 Application topically as needed.   Livalo  4 MG Tabs Generic drug: Pitavastatin  Calcium  Take 4 mg by mouth at bedtime.   losartan  25 MG tablet Commonly known as: COZAAR  Take 1 tablet  (25 mg total) by mouth daily.   metoprolol  succinate 25 MG 24 hr tablet Commonly known as: TOPROL -XL Take 1 tablet (25 mg total) by mouth 2 (two) times daily.   Multivitamin Men 50+ Tabs Take 1 tablet by mouth daily.   nabumetone  750 MG tablet Commonly known as: RELAFEN  Take 750 mg by mouth daily.   nitroGLYCERIN  0.4 MG SL tablet Commonly known as: NITROSTAT  Place 0.4 mg under the tongue every 5 (five) minutes as needed for chest pain.   pantoprazole  40 MG tablet Commonly known as: PROTONIX  Take 1 tablet (40 mg total) by mouth 2 (two) times daily.   PROBIOTIC DAILY PO Take 1 capsule by mouth daily.   Takhzyro  300 MG/2ML Sosy Generic drug: Lanadelumab -flyo INJECT 300MG  SUBCUTANEOUSLY  EVERY 2 WEEKS   temazepam  15 MG capsule Commonly known as: RESTORIL  Take 15 mg by mouth at bedtime.   VITAMIN B-12 PO Take 1 tablet by mouth daily.    Past Medical History:  Diagnosis Date  . Abnormal EKG 02/21/2015  . Acute lower GI bleeding 05/28/2019  . Anginal pain 2016  . Angio-edema   . Anxiety   . Arthritis   . CAD S/P percutaneous coronary angioplasty    NSTEMI treated with LAD PCI with DES x 03 Feb 2015 Myoview low risk Nov 2017  . Cataract   . Cerebellar stroke (HCC) 06/14/2021  . Cerebellar stroke (HCC) 11/2023  . CHF (congestive heart failure) (HCC) 2020   after surgery only  . Chronic diarrhea of unknown origin   . CKD (chronic kidney disease) stage 3, GFR 30-59 ml/min 12/21/2017  . Colitis 05/23/2015  . Coronary artery disease 05/28/2019  . CVA (cerebral vascular accident) (HCC) 11/27/2023  . Diverticular hemorrhage   . Diverticulosis   . Dyslipidemia, goal LDL below 70 02/21/2015   hyperlipidemia   . Dysrhythmia    A-fib  . Enterotoxigenic Escherichia coli infection 05/24/2015  . Essential hypertension 02/21/2015  . Geographic tongue   . GERD (gastroesophageal reflux disease)   . GERD without esophagitis 06/15/2021  . GI bleed 12/21/2017  . Glossitis,  benign migratory 12/04/2018  . Gout   . Hematochezia 12/21/2017   Admitted Sept 2019 with lower GI bleeding felt to be secondary to hemorrhoids  . Hereditary angioedema type 2 (HCC) 07/28/2017  . High cholesterol   . History of kidney stones   . History of non-ST elevation myocardial infarction (NSTEMI) 02/22/2015   Nov 2016  . History of prolonged Q-T interval on ECG   . History of prostate cancer   . Hoarseness 12/04/2018  . Hx of Clostridium difficile infection   . Hx of umbilical hernia repair   . Hypertension   . Hypothyroidism   . Hypothyroidism, unspecified 06/15/2021  . ILD (interstitial lung disease) (HCC) 12/17/2013   ?related to chicken farm exposure or aspiration pneumonitis from GERD Previously attributed to acute interstitial pneumonitis but based on HRCT this is  likely IPF, probable UIP  . Lower GI bleed 06/03/2019  . Malignant neoplasm of prostate (HCC) 08/07/2014  . Myocardial infarction (HCC) 2016  . NSTEMI (non-ST elevated myocardial infarction) (HCC) 05/21/2018  . Open metatarsal fracture 09/02/2020  . PAF (paroxysmal atrial fibrillation) (HCC) 06/13/2018   Post CABG- discharged on Amiodarone , he will probably not need this long term  . Palpitations 03/15/2015   palpitations   . Peripheral vascular disease    vericose veins  . Personal history of digestive disease    gastric ulcer  . Pre-diabetes   . Pre-operative clearance 01/02/2018  . Prostate cancer (HCC)   . Prostate enlargement   . S/P CABG x 3 05/28/2018    LIMA-LAD, RIMA-RCA, and Lt radial to OM.  . Skin cancer    basal cell carcinoma  . STEC (Shiga toxin-producing Escherichia coli) infection 05/24/2015  . Stroke (cerebrum) (HCC) 06/15/2021  . Tachycardia determined by examination of pulse   . Thyroid  disease   . Type 2 diabetes mellitus with unspecified complications (HCC)   . Type 2 diabetes mellitus without complications (HCC) 06/15/2021  . Urticaria     Past Surgical History:   Procedure Laterality Date  . APPENDECTOMY    . BACK SURGERY    . CARDIAC CATHETERIZATION N/A 02/21/2015   Procedure: Left Heart Cath;  Surgeon: Dorn JINNY Lesches, MD;  Location: [facility] INVASIVE CV LAB;  Service: Cardiovascular;  Laterality: N/A;  . CATARACT EXTRACTION, BILATERAL    . CORONARY ARTERY BYPASS GRAFT N/A 05/24/2018   Procedure: CORONARY ARTERY BYPASS GRAFTING (CABG), ON PUMP, TIMES THREE, USING BILATERAL INTERNAL MAMMARY ARTERY AND HARVESTED LEFT RADIAL ARTERY;  Surgeon: Lucas Dorise POUR, MD;  Location: [facility] OR;  Service: Open Heart Surgery;  Laterality: N/A;  . CYSTOSCOPY WITH LITHOLAPAXY N/A 07/26/2020   Procedure: CYSTOSCOPY WITH LITHOLAPAXY WITH FULGERATION;  Surgeon: Renda Glance, MD;  Location: [facility] ORS;  Service: Urology;  Laterality: N/A;  . HOLMIUM LASER APPLICATION N/A 07/26/2020   Procedure: HOLMIUM LASER APPLICATION;  Surgeon: Renda Glance, MD;  Location: [facility] ORS;  Service: Urology;  Laterality: N/A;  . I & D EXTREMITY Right 09/02/2020   Procedure: IRRIGATION AND DEBRIDEMENT RIGHT 2ND AND 3RD METATARSAL;  Surgeon: Kit Rush, MD;  Location: [facility] OR;  Service: Orthopedics;  Laterality: Right;  . LEFT HEART CATH AND CORONARY ANGIOGRAPHY N/A 05/22/2018   Procedure: LEFT HEART CATH AND CORONARY ANGIOGRAPHY;  Surgeon: Dann Candyce RAMAN, MD;  Location: [facility] INVASIVE CV LAB;  Service: Cardiovascular;  Laterality: N/A;  . NASAL SINUS SURGERY    . ORIF TOE FRACTURE Right 09/02/2020   Procedure: OPEN REDUCTION INTERNAL FIXATION (ORIF) OF RIGHT OPEN 2ND AND 3RD METATARSAL (TOE) OPEN FRACTURE;  Surgeon: Kit Rush, MD;  Location: [facility] OR;  Service: Orthopedics;  Laterality: Right;  . PROSTATE BIOPSY    . RADIAL ARTERY HARVEST Left 05/24/2018   Procedure: RADIAL ARTERY HARVEST;  Surgeon: Lucas Dorise POUR, MD;  Location: [facility] OR;  Service: Open Heart Surgery;  Laterality: Left;  . right shoulder rotator cuff repair    . TEE WITHOUT CARDIOVERSION N/A 05/24/2018   Procedure: TRANSESOPHAGEAL  ECHOCARDIOGRAM (TEE);  Surgeon: Lucas Dorise POUR, MD;  Location: [facility] OR;  Service: Open Heart Surgery;  Laterality: N/A;    Review of systems negative except as noted in HPI / PMHx or noted below:  Review of Systems  Constitutional: Negative.   HENT: Negative.    Eyes: Negative.   Respiratory: Negative.    Cardiovascular: Negative.   Gastrointestinal: Negative.  Genitourinary: Negative.   Musculoskeletal: Negative.   Skin: Negative.   Neurological: Negative.   Endo/Heme/Allergies: Negative.   Psychiatric/Behavioral: Negative.       Objective:   Vitals:   02/06/24 1107  BP: (!) 156/70  Pulse: 60  Resp: 18  SpO2: 98%          Physical Exam Constitutional:      Appearance: He is not diaphoretic.  HENT:     Head: Normocephalic.     Right Ear: Tympanic membrane, ear canal and external ear normal.     Left Ear: Tympanic membrane, ear canal and external ear normal.     Nose: Nose normal. No mucosal edema or rhinorrhea.     Mouth/Throat:     Pharynx: Uvula midline. No oropharyngeal exudate.  Eyes:     Conjunctiva/sclera: Conjunctivae normal.  Neck:     Thyroid : No thyromegaly.     Trachea: Trachea normal. No tracheal tenderness or tracheal deviation.  Cardiovascular:     Rate and Rhythm: Normal rate and regular rhythm.     Heart sounds: Normal heart sounds, S1 normal and S2 normal. No murmur heard. Pulmonary:     Effort: No respiratory distress.     Breath sounds: Normal breath sounds. No stridor. No wheezing or rales.  Lymphadenopathy:     Head:     Right side of head: No tonsillar adenopathy.     Left side of head: No tonsillar adenopathy.     Cervical: No cervical adenopathy.  Skin:    Findings: No erythema or rash.     Nails: There is no clubbing.  Neurological:     Mental Status: He is alert.     Diagnostics: none  Assessment and Plan:   1. Hereditary angioedema type 2 (HCC)   2. Perennial allergic rhinitis   3. LPRD (laryngopharyngeal reflux  disease)   4. Sicca syndrome   5. ILD (interstitial lung disease) (HCC)     1.  Continue Takhzryo every 14 days.   2.  Continue to Treat and prevent inflammation:    A.  Flonase  - 1-2 sprays each nostril once a day  3.  Continue to Treat and prevent reflux:   A.  pantoprazole  40 mg twice a day  4.  If needed:   A.  Nasal saline spray  B.  Azelastine  nose spray 1-2 times per day  C.  OTC Mucinex  DM - 2 tablets twice a day  D.  Firazyr  injection for swelling reaction  E.  Systane eye drops  5. Return to clinic in 6 months or earlier if problem  6. Influenza = Tamiflu. Covid = Paxlovid / molnupiravir  Alfie is doing very well regarding his type II hereditary angioedema on his current plan and he will continue on his biologic agent every 2 weeks.  Then his nose and his reflux is good while using a nasal steroid and a proton pump inhibitor we will continue on these agents as well.  If he does go on a blood thinner for his recent embolic stroke/atrial fibrillation then if he ever does contract COVID he would need to use molnupiravir instead of Paxlovid.  He has some serious concerns about using amiodarone  and I told him that he should use this medication and should he develop any problems as he moves forward he can always discontinue this medication.  His untreated interstitial lung disease appears to be stable clinically.  If he does well I will see him back in this clinic  in 6 months or earlier if there is a problem.  Camellia Denis, MD Allergy / Immunology [facility]

## 2024-02-07 ENCOUNTER — Encounter: Payer: Self-pay | Admitting: Allergy and Immunology

## 2024-02-12 DIAGNOSIS — I700 Atherosclerosis of aorta: Secondary | ICD-10-CM | POA: Diagnosis not present

## 2024-02-12 DIAGNOSIS — D841 Defects in the complement system: Secondary | ICD-10-CM | POA: Diagnosis not present

## 2024-02-12 DIAGNOSIS — N1831 Chronic kidney disease, stage 3a: Secondary | ICD-10-CM | POA: Diagnosis not present

## 2024-02-12 DIAGNOSIS — J849 Interstitial pulmonary disease, unspecified: Secondary | ICD-10-CM | POA: Diagnosis not present

## 2024-02-12 DIAGNOSIS — D649 Anemia, unspecified: Secondary | ICD-10-CM | POA: Diagnosis not present

## 2024-02-12 DIAGNOSIS — E538 Deficiency of other specified B group vitamins: Secondary | ICD-10-CM | POA: Diagnosis not present

## 2024-02-12 DIAGNOSIS — E785 Hyperlipidemia, unspecified: Secondary | ICD-10-CM | POA: Diagnosis not present

## 2024-02-12 DIAGNOSIS — C61 Malignant neoplasm of prostate: Secondary | ICD-10-CM | POA: Diagnosis not present

## 2024-02-12 DIAGNOSIS — I10 Essential (primary) hypertension: Secondary | ICD-10-CM | POA: Diagnosis not present

## 2024-02-12 DIAGNOSIS — E039 Hypothyroidism, unspecified: Secondary | ICD-10-CM | POA: Diagnosis not present

## 2024-02-12 DIAGNOSIS — E559 Vitamin D deficiency, unspecified: Secondary | ICD-10-CM | POA: Diagnosis not present

## 2024-02-12 DIAGNOSIS — E1169 Type 2 diabetes mellitus with other specified complication: Secondary | ICD-10-CM | POA: Diagnosis not present

## 2024-02-18 ENCOUNTER — Telehealth: Payer: Self-pay

## 2024-02-18 MED ORDER — APIXABAN 2.5 MG PO TABS: 2.5000 mg | ORAL_TABLET | Freq: Two times a day (BID) | ORAL | 12 refills | Status: DC

## 2024-02-18 NOTE — Telephone Encounter (Signed)
 Pt states that he started the Amiodarone  as prescribed and he had dizziness, shortness of breath and chest tightness. Pt states even decreasing the dose he was unable to tolerate the medication so he has not taken any additional doses of Amiodarone .  Recommendations reviewed with pt as per Dr. Posey note regarding starting Eliquis. Pt verbalized understanding and had no additional questions.  Eliquis dose Age 83 Weight 90 kg Creatinine 1.64 01/30/24  Eliquis 2.5 mg BID

## 2024-02-18 NOTE — Telephone Encounter (Signed)
-----   Message from Jennifer SAUNDERS Revankar sent at 02/15/2024  1:30 PM EST ----- Not sure why he has not started amiodarone .  Please give him my note and talk to him and let him start.  Neurology is okay to start him on Eliquis.  Lets do this today.  Thank you ----- Message ----- From: Gregg Lek, MD Sent: 02/06/2024   5:14 PM EST To: Jennifer SAUNDERS Crape, MD  Dr. Jennifer,  I saw our mutual patient Daniel Herrera. First, I wanted to let you know that he did not start the Amiodarone , I have strongly advised him to contact you to discuss his concerns.  Second, for anticoagulation, it is reasonable to initiate anticoagulation and continue to monitor for GI bleed, if it reoccur, then possibly watchman device.    Thank you and have a good rest of the evening.  Dr. Camara

## 2024-02-20 ENCOUNTER — Other Ambulatory Visit: Payer: Self-pay | Admitting: Cardiology

## 2024-02-21 ENCOUNTER — Other Ambulatory Visit: Payer: Self-pay | Admitting: Neurology

## 2024-02-22 DIAGNOSIS — I10 Essential (primary) hypertension: Secondary | ICD-10-CM | POA: Diagnosis not present

## 2024-02-23 ENCOUNTER — Other Ambulatory Visit: Payer: Self-pay | Admitting: Neurology

## 2024-03-05 ENCOUNTER — Encounter: Payer: Self-pay | Admitting: Cardiology

## 2024-03-05 ENCOUNTER — Telehealth: Payer: Self-pay

## 2024-03-05 ENCOUNTER — Ambulatory Visit: Attending: Cardiology | Admitting: Cardiology

## 2024-03-05 VITALS — BP 138/70 | HR 66 | Ht 73.0 in | Wt 200.8 lb

## 2024-03-05 DIAGNOSIS — I2510 Atherosclerotic heart disease of native coronary artery without angina pectoris: Secondary | ICD-10-CM | POA: Diagnosis not present

## 2024-03-05 DIAGNOSIS — I10 Essential (primary) hypertension: Secondary | ICD-10-CM | POA: Diagnosis not present

## 2024-03-05 DIAGNOSIS — Z951 Presence of aortocoronary bypass graft: Secondary | ICD-10-CM | POA: Diagnosis not present

## 2024-03-05 DIAGNOSIS — I480 Paroxysmal atrial fibrillation: Secondary | ICD-10-CM | POA: Diagnosis not present

## 2024-03-05 DIAGNOSIS — E118 Type 2 diabetes mellitus with unspecified complications: Secondary | ICD-10-CM | POA: Insufficient documentation

## 2024-03-05 DIAGNOSIS — Z9861 Coronary angioplasty status: Secondary | ICD-10-CM | POA: Diagnosis not present

## 2024-03-05 DIAGNOSIS — N183 Chronic kidney disease, stage 3 unspecified: Secondary | ICD-10-CM | POA: Insufficient documentation

## 2024-03-05 DIAGNOSIS — E538 Deficiency of other specified B group vitamins: Secondary | ICD-10-CM | POA: Diagnosis not present

## 2024-03-05 NOTE — Patient Outreach (Signed)
 First telephone outreach attempt to obtain mRS. Pt answered but hung up before able to get a french interpreter.  Shereen [facility]unders [facility]  Population Health Care Management Assistant  Direct Dial: [phone]  Fax: [phone] Website: [email or web address]

## 2024-03-05 NOTE — Patient Instructions (Signed)
Medication Instructions:  Your physician recommends that you continue on your current medications as directed. Please refer to the Current Medication list given to you today.  *If you need a refill on your cardiac medications before your next appointment, please call your pharmacy*   Lab Work: None ordered If you have labs (blood work) drawn today and your tests are completely normal, you will receive your results only by: MyChart Message (if you have MyChart) OR A paper copy in the mail If you have any lab test that is abnormal or we need to change your treatment, we will call you to review the results.   Testing/Procedures: None ordered   Follow-Up: At [facility], you and your health needs are our priority.  As part of our continuing mission to provide you with exceptional heart care, we have created designated Provider Care Teams.  These Care Teams include your primary Cardiologist (physician) and Advanced Practice Providers (APPs -  Physician Assistants and Nurse Practitioners) who all work together to provide you with the care you need, when you need it.  We recommend signing up for the patient portal called "MyChart".  Sign up information is provided on this After Visit Summary.  MyChart is used to connect with patients for Virtual Visits (Telemedicine).  Patients are able to view lab/test results, encounter notes, upcoming appointments, etc.  Non-urgent messages can be sent to your provider as well.   To learn more about what you can do with MyChart, go to [email or web address].    Your next appointment:   6 month(s)  The format for your next appointment:   In Person  Provider:   Rajan Revankar, MD    Other Instructions none  Important Information About Sugar       

## 2024-03-05 NOTE — Progress Notes (Signed)
 Cardiology Office Note:    Date:  03/05/2024   ID:  Daniel Herrera, DOB [birth year], MRN [id number]  PCP:  Erick Greig LABOR, NP  Cardiologist:  Jennifer JONELLE Crape, MD   Referring MD: Erick Greig LABOR, NP    ASSESSMENT:    1. Essential hypertension   2. CAD S/P percutaneous coronary angioplasty   3. PAF (paroxysmal atrial fibrillation) (HCC)   4. Type 2 diabetes mellitus with unspecified complications (HCC)   5. Stage 3 chronic kidney disease, unspecified whether stage 3a or 3b CKD (HCC)   6. S/P CABG x 3    PLAN:    In order of problems listed above:  Coronary artery disease: Secondary prevention stressed with the patient.  Importance of compliance with diet medication stressed and patient verbalized standing.  He ambulates with a cane and was encouraged to be active. History of stroke: Stable at this time. Paroxysmal atrial fibrillation:I discussed with the patient atrial fibrillation, disease process. Management and therapy including rate and rhythm control, anticoagulation benefits and potential risks were discussed extensively with the patient. Patient had multiple questions which were answered to patient's satisfaction. Amiodarone  therapy: He discontinued it as he is not tolerant to it and is not keen on it.  He tells me that he has no palpitations or any such issues. Essential hypertension: Blood pressure is stable and diet was emphasized. Mixed dyslipidemia: On lipid-lowering medications followed by primary care. Patient will be seen in follow-up appointment in 6 months or earlier if the patient has any concerns.    Medication Adjustments/Labs and Tests Ordered: Current medicines are reviewed at length with the patient today.  Concerns regarding medicines are outlined above.  Orders Placed This Encounter  Procedures   EKG 12-Lead   No orders of the defined types were placed in this encounter.    No chief complaint on file.    History of Present Illness:    Daniel Herrera is a 83 y.o. male.  Patient has past medical history of coronary artery disease, history of stroke, essential hypertension, mixed dyslipidemia and paroxysmal atrial fibrillation.  He did not tolerate amiodarone .  He tells me that he is not keen to take amiodarone .  He denies any palpitations or any such issues.  At the time of my evaluation, the patient is alert awake oriented and in no distress.  He has a nasal insufficiency.  Past Medical History:  Diagnosis Date   Abnormal EKG 02/21/2015   Acute lower GI bleeding 05/28/2019   Anginal pain 2016   Angio-edema    Anxiety    Arthritis    CAD S/P percutaneous coronary angioplasty    NSTEMI treated with LAD PCI with DES x 03 Feb 2015 Myoview low risk Nov 2017   Cataract    Cerebellar stroke (HCC) 06/14/2021   Cerebellar stroke (HCC) 11/2023   CHF (congestive heart failure) (HCC) 2020   after surgery only   Chronic diarrhea of unknown origin    CKD (chronic kidney disease) stage 3, GFR 30-59 ml/min 12/21/2017   Colitis 05/23/2015   Coronary artery disease 05/28/2019   CVA (cerebral vascular accident) (HCC) 11/27/2023   Diverticular hemorrhage    Diverticulosis    Dyslipidemia, goal LDL below 70 02/21/2015   hyperlipidemia    Dysrhythmia    A-fib   Enterotoxigenic Escherichia coli infection 05/24/2015   Essential hypertension 02/21/2015   Geographic tongue    GERD (gastroesophageal reflux disease)    GERD without esophagitis 06/15/2021  GI bleed 12/21/2017   Glossitis, benign migratory 12/04/2018   Gout    Hematochezia 12/21/2017   Admitted Sept 2019 with lower GI bleeding felt to be secondary to hemorrhoids   Hereditary angioedema type 2 (HCC) 07/28/2017   High cholesterol    History of kidney stones    History of non-ST elevation myocardial infarction (NSTEMI) 02/22/2015   Nov 2016   History of prolonged Q-T interval on ECG    History of prostate cancer    Hoarseness 12/04/2018   Hx of umbilical hernia repair     Hypertension    Hypothyroidism    Hypothyroidism, unspecified 06/15/2021   ILD (interstitial lung disease) (HCC) 12/17/2013   ?related to chicken farm exposure or aspiration pneumonitis from GERD Previously attributed to acute interstitial pneumonitis but based on HRCT this is likely IPF, probable UIP   Lower GI bleed 06/03/2019   Malignant neoplasm of prostate (HCC) 08/07/2014   Myocardial infarction [facility]) 2016   NSTEMI (non-ST elevated myocardial infarction) (HCC) 05/21/2018   Open metatarsal fracture 09/02/2020   PAF (paroxysmal atrial fibrillation) (HCC) 06/13/2018   Post CABG- discharged on Amiodarone , he will probably not need this long term   Palpitations 03/15/2015   palpitations    Peripheral vascular disease    vericose veins   Personal history of digestive disease    gastric ulcer   Pre-diabetes    Pre-operative clearance 01/02/2018   Prostate cancer (HCC)    Prostate enlargement    S/P CABG x 3 05/28/2018    LIMA-LAD, RIMA-RCA, and Lt radial to OM.   Skin cancer    basal cell carcinoma   STEC (Shiga toxin-producing Escherichia coli) infection 05/24/2015   Stroke (cerebrum) (HCC) 06/15/2021   Tachycardia determined by examination of pulse    Thyroid  disease    Type 2 diabetes mellitus with unspecified complications (HCC)    Type 2 diabetes mellitus without complications (HCC) 06/15/2021   Urticaria     Past Surgical History:  Procedure Laterality Date   APPENDECTOMY     BACK SURGERY     CARDIAC CATHETERIZATION N/A 02/21/2015   Procedure: Left Heart Cath;  Surgeon: Dorn JINNY Lesches, MD;  Location: [facility] INVASIVE CV LAB;  Service: Cardiovascular;  Laterality: N/A;   CATARACT EXTRACTION, BILATERAL     CORONARY ARTERY BYPASS GRAFT N/A 05/24/2018   Procedure: CORONARY ARTERY BYPASS GRAFTING (CABG), ON PUMP, TIMES THREE, USING BILATERAL INTERNAL MAMMARY ARTERY AND HARVESTED LEFT RADIAL ARTERY;  Surgeon: Lucas Dorise POUR, MD;  Location: [facility] OR;  Service: Open Heart Surgery;   Laterality: N/A;   CYSTOSCOPY WITH LITHOLAPAXY N/A 07/26/2020   Procedure: CYSTOSCOPY WITH LITHOLAPAXY WITH FULGERATION;  Surgeon: Renda Glance, MD;  Location: [facility] ORS;  Service: Urology;  Laterality: N/A;   HOLMIUM LASER APPLICATION N/A 07/26/2020   Procedure: HOLMIUM LASER APPLICATION;  Surgeon: Renda Glance, MD;  Location: [facility] ORS;  Service: Urology;  Laterality: N/A;   I & D EXTREMITY Right 09/02/2020   Procedure: IRRIGATION AND DEBRIDEMENT RIGHT 2ND AND 3RD METATARSAL;  Surgeon: Kit Rush, MD;  Location: [facility] OR;  Service: Orthopedics;  Laterality: Right;   LEFT HEART CATH AND CORONARY ANGIOGRAPHY N/A 05/22/2018   Procedure: LEFT HEART CATH AND CORONARY ANGIOGRAPHY;  Surgeon: Dann Candyce RAMAN, MD;  Location: [facility] INVASIVE CV LAB;  Service: Cardiovascular;  Laterality: N/A;   NASAL SINUS SURGERY     ORIF TOE FRACTURE Right 09/02/2020   Procedure: OPEN REDUCTION INTERNAL FIXATION (ORIF) OF RIGHT OPEN 2ND AND 3RD METATARSAL (TOE)  OPEN FRACTURE;  Surgeon: Kit Rush, MD;  Location[facility] OR;  Service: Orthopedics;  Laterality: Right;   PROSTATE BIOPSY     RADIAL ARTERY HARVEST Left 05/24/2018   Procedure: RADIAL ARTERY HARVEST;  Surgeon: Lucas Dorise POUR, MD;  Location: [facility] OR;  Service: Open Heart Surgery;  Laterality: Left;   right shoulder rotator cuff repair     TEE WITHOUT CARDIOVERSION N/A 05/24/2018   Procedure: TRANSESOPHAGEAL ECHOCARDIOGRAM (TEE);  Surgeon: Lucas Dorise POUR, MD;  Location: [facility] OR;  Service: Open Heart Surgery;  Laterality: N/A;    Current Medications: Current Meds  Medication Sig   albuterol  (VENTOLIN  HFA) 108 (90 Base) MCG/ACT inhaler Inhale 2 puffs into the lungs. Every 4-6 hours as needed   amLODipine  (NORVASC ) 2.5 MG tablet TAKE 1 TABLET BY MOUTH EVERY DAY   apixaban  (ELIQUIS ) 2.5 MG TABS tablet Take 1 tablet (2.5 mg total) by mouth 2 (two) times daily.   artificial tears ophthalmic solution Place 1 drop into both eyes as needed for dry eyes.   aspirin  81 MG EC  tablet Take 1 tablet (81 mg total) by mouth daily.   Azelastine  HCl 137 MCG/SPRAY SOLN Place 1 spray into both nostrils 2 (two) times daily.   betamethasone  valerate ointment (VALISONE ) 0.1 % Apply 1 application topically 2 (two) times daily as needed for rash.   Cyanocobalamin  (VITAMIN B-12 PO) Take 1 tablet by mouth daily.   ezetimibe  (ZETIA ) 10 MG tablet Take 1 tablet (10 mg total) by mouth daily.   fluoruracil (CARAC ) 0.5 % cream Apply to skin cancer for 1-7 days during 'flare up' as directed.   fluticasone  (FLONASE ) 50 MCG/ACT nasal spray Place 1 spray into both nostrils 2 (two) times daily.   gabapentin (NEURONTIN) 100 MG capsule Take 100-200 mg by mouth at bedtime.   icatibant  Acetate (FIRAZYR ) 30 MG/3ML injection Inject 30 mg into the skin once as needed (HAE).   levothyroxine  (SYNTHROID ) 100 MCG tablet Take 100 mcg by mouth daily before breakfast.   lidocaine -prilocaine  (EMLA ) cream Apply 1 Application topically as needed.   losartan  (COZAAR ) 25 MG tablet Take 1 tablet (25 mg total) by mouth daily.   metoprolol  succinate (TOPROL -XL) 25 MG 24 hr tablet Take 1 tablet (25 mg total) by mouth 2 (two) times daily.   Multiple Vitamins-Minerals (MULTIVITAMIN MEN 50+) TABS Take 1 tablet by mouth daily.   nabumetone  (RELAFEN ) 750 MG tablet Take 750 mg by mouth daily.   nitroGLYCERIN  (NITROSTAT ) 0.4 MG SL tablet Place 0.4 mg under the tongue every 5 (five) minutes as needed for chest pain.   pantoprazole  (PROTONIX ) 40 MG tablet Take 1 tablet (40 mg total) by mouth 2 (two) times daily.   Pitavastatin  Calcium  (LIVALO ) 4 MG TABS Take 4 mg by mouth at bedtime.   Probiotic Product (PROBIOTIC DAILY PO) Take 1 capsule by mouth daily.   TAKHZYRO  300 MG/2ML SOSY INJECT 300MG  SUBCUTANEOUSLY  EVERY 2 WEEKS   temazepam  (RESTORIL ) 15 MG capsule Take 15 mg by mouth at bedtime.     Allergies:   Zestril  [lisinopril ], Buspirone , Roxicodone  [oxycodone ], Tape, Benadryl [diphenhydramine hcl], Darvocet [propoxyphene  n-acetaminophen ], Darvon [propoxyphene hcl], Norvasc  [amlodipine ], and Tylenol  [acetaminophen ]   Social History   Socioeconomic History   Marital status: Married    Spouse name: Zelda   Number of children: 3   Years of education: Not on file   Highest education level: Bachelor's degree (e.g., BA, AB, BS)  Occupational History   Occupation: retired  Tobacco Use   Smoking  status: Never   Smokeless tobacco: Never  Vaping Use   Vaping status: Never Used  Substance and Sexual Activity   Alcohol  use: Never   Drug use: No   Sexual activity: Not Currently  Other Topics Concern   Not on file  Social History Narrative   Not on file   Social Drivers of Health   Financial Resource Strain: Not on file  Food Insecurity: No Food Insecurity (11/25/2023)   Hunger Vital Sign    Worried About Running Out of Food in the Last Year: Never true    Ran Out of Food in the Last Year: Never true  Transportation Needs: No Transportation Needs (11/25/2023)   PRAPARE - Administrator, Civil Service (Medical): No    Lack of Transportation (Non-Medical): No  Physical Activity: Inactive (09/30/2018)   Received from [facility]   Exercise Vital Sign    On average, how many days per week do you engage in moderate to strenuous exercise (like a brisk walk)?: 0 days    On average, how many minutes do you engage in exercise at this level?: 0 min  Stress: No Stress Concern Present (09/30/2018)   Received from [facility] of Occupational [facility] - Occupational Stress Questionnaire    Feeling of Stress : Not at all  Social Connections: Unknown (11/25/2023)   Social Connection and Isolation Panel    Frequency of Communication with Friends and Family: More than three times a week    Frequency of Social Gatherings with Friends and Family: More than three times a week    Attends Religious Services: Not on Marketing Executive or Organizations: Not on file    Attends  Banker Meetings: Not on file    Marital Status: Not on file     Family History: The patient's family history includes Cancer in his father, mother, and sister; Stomach cancer in his mother. There is no history of Sleep apnea.  ROS:   Please see the history of present illness.    All other systems reviewed and are negative.  EKGs/Labs/Other Studies Reviewed:    The following studies were reviewed today: .SABRAEKG Interpretation Date/Time:  Wednesday March 05 2024 09:12:01 EST Ventricular Rate:  66 PR Interval:  256 QRS Duration:  98 QT Interval:  414 QTC Calculation: 434 R Axis:   -15  Text Interpretation: Sinus rhythm with 1st degree A-V block Left ventricular hypertrophy Abnormal ECG When compared with ECG of 24-Nov-2023 13:51, Inverted T waves have replaced nonspecific T wave abnormality in Inferior leads Confirmed by Edwyna Backers [phone]) on 03/05/2024 9:16:20 AM     Recent Labs: 01/30/2024: ALT 23; BUN 20; Creatinine, Ser 1.64; Hemoglobin 13.1; Magnesium  2.0; Platelets 177; Potassium 4.0; Sodium 139; TSH 1.660  Recent Lipid Panel    Component Value Date/Time   CHOL 168 11/24/2023 1146   CHOL 183 05/23/2019 0826   TRIG 144 11/24/2023 1146   HDL 33 (L) 11/24/2023 1146   HDL 44 05/23/2019 0826   CHOLHDL 5.1 11/24/2023 1146   VLDL 29 11/24/2023 1146   LDLCALC 106 (H) 11/24/2023 1146   LDLCALC 108 (H) 05/23/2019 0826    Physical Exam:    VS:  BP (!) 146/68   Pulse 66   Ht 6' 1 (1.854 m)   Wt 200 lb 12.8 oz (91.1 kg)   SpO2 97%   BMI 26.49 kg/m     Wt Readings from  Last 3 Encounters:  03/05/24 200 lb 12.8 oz (91.1 kg)  02/05/24 198 lb 8 oz (90 kg)  01/31/24 193 lb 3.2 oz (87.6 kg)     GEN: Patient is in no acute distress HEENT: Normal NECK: No JVD; No carotid bruits LYMPHATICS: No lymphadenopathy CARDIAC: Hear sounds regular, 2/6 systolic murmur at the apex. RESPIRATORY:  Clear to auscultation without rales, wheezing or rhonchi  ABDOMEN:  Soft, non-tender, non-distended MUSCULOSKELETAL:  No edema; No deformity  SKIN: Warm and dry NEUROLOGIC:  Alert and oriented x 3 PSYCHIATRIC:  Normal affect   Signed, Jennifer JONELLE Crape, MD  03/05/2024 9:17 AM    [facility]

## 2024-03-08 ENCOUNTER — Ambulatory Visit (INDEPENDENT_AMBULATORY_CARE_PROVIDER_SITE_OTHER): Admit: 2024-03-08 | Discharge: 2024-03-08 | Disposition: A | Admitting: Radiology

## 2024-03-08 ENCOUNTER — Ambulatory Visit (HOSPITAL_BASED_OUTPATIENT_CLINIC_OR_DEPARTMENT_OTHER): Admission: EM | Admit: 2024-03-08 | Discharge: 2024-03-08 | Disposition: A | Discharge status: Home / Self Care

## 2024-03-08 ENCOUNTER — Encounter (HOSPITAL_BASED_OUTPATIENT_CLINIC_OR_DEPARTMENT_OTHER): Payer: Self-pay

## 2024-03-08 DIAGNOSIS — M79652 Pain in left thigh: Secondary | ICD-10-CM

## 2024-03-08 DIAGNOSIS — S7002XA Contusion of left hip, initial encounter: Secondary | ICD-10-CM | POA: Diagnosis not present

## 2024-03-08 DIAGNOSIS — M79605 Pain in left leg: Secondary | ICD-10-CM | POA: Diagnosis not present

## 2024-03-08 NOTE — ED Triage Notes (Signed)
 Pt states on Thursday he went to sit on the side of his bed and slide off into the floor. Since the fall he has had increased left hip and leg pain. Pt has hx of stroke and has left leg weakness to begin with. Denies any swelling or bruising. He has not taken anything for the pain. In the fall he skinned his left arm on wooden drawers. He has that bandage and denies any issues at this time.

## 2024-03-08 NOTE — Discharge Instructions (Signed)
  1. Contusion of left hip region (Primary) - DG Femur Min 2 Views Left x-ray performed in [facility] shows no acute fracture or dislocation to the left hip or femur. - Use over-the-counter topical pain relieving ointment as needed for pain and inflammation control.  -Continue to monitor symptoms for any change in severity if there is any escalation of current symptoms or development of new symptoms follow-up in ER for further evaluation and management.

## 2024-03-08 NOTE — ED Provider Notes (Signed)
 [facility]  Note:  This document was prepared using Conservation officer, historic buildings and may include unintentional dictation errors.  MRN: [id number] DOB: July 21, 1940  Subjective:   Daniel Herrera is a 83 y.o. male presenting for left-sided hip/upper leg pain after a fall that occurred on Thursday at home.  Patient reports that he was trying to sit on the edge of his bed when he slipped falling on his left hip on the floor.  Patient reports that he had a stroke previously and has residual left leg weakness which he believes contributed to leg giving out causing him to slide into the floor.  Patient denies any significant bruising, swelling, obvious deformity.  Patient also reports that he skinned his left arm on wooden drawers when sliding to the floor.  Patient denies any concern for wound care or secondary infection.  Patient is unable to take any over-the-counter pain medication due to allergies.  No current facility-administered medications for this encounter.  Current Outpatient Medications:    albuterol  (VENTOLIN  HFA) 108 (90 Base) MCG/ACT inhaler, Inhale 2 puffs into the lungs. Every 4-6 hours as needed, Disp: , Rfl:    amLODipine  (NORVASC ) 2.5 MG tablet, TAKE 1 TABLET BY MOUTH EVERY DAY, Disp: 90 tablet, Rfl: 2   apixaban  (ELIQUIS ) 2.5 MG TABS tablet, Take 1 tablet (2.5 mg total) by mouth 2 (two) times daily., Disp: 60 tablet, Rfl: 12   artificial tears ophthalmic solution, Place 1 drop into both eyes as needed for dry eyes., Disp: 15 mL, Rfl: 0   aspirin  81 MG EC tablet, Take 1 tablet (81 mg total) by mouth daily., Disp: 30 tablet, Rfl: 11   Azelastine  HCl 137 MCG/SPRAY SOLN, Place 1 spray into both nostrils 2 (two) times daily., Disp: , Rfl:    betamethasone  valerate ointment (VALISONE ) 0.1 %, Apply 1 application topically 2 (two) times daily as needed for rash., Disp: , Rfl:    Cyanocobalamin  (VITAMIN B-12 PO), Take 1 tablet by mouth daily., Disp: , Rfl:    ezetimibe   (ZETIA ) 10 MG tablet, Take 1 tablet (10 mg total) by mouth daily., Disp: 30 tablet, Rfl: 0   fluoruracil (CARAC ) 0.5 % cream, Apply to skin cancer for 1-7 days during 'flare up' as directed., Disp: 30 g, Rfl: 3   fluticasone  (FLONASE ) 50 MCG/ACT nasal spray, Place 1 spray into both nostrils 2 (two) times daily., Disp: , Rfl:    gabapentin (NEURONTIN) 100 MG capsule, Take 100-200 mg by mouth at bedtime., Disp: , Rfl:    icatibant  Acetate (FIRAZYR ) 30 MG/3ML injection, Inject 30 mg into the skin once as needed (HAE)., Disp: , Rfl:    levothyroxine  (SYNTHROID ) 100 MCG tablet, Take 100 mcg by mouth daily before breakfast., Disp: , Rfl:    lidocaine -prilocaine  (EMLA ) cream, Apply 1 Application topically as needed., Disp: 30 g, Rfl: 0   losartan  (COZAAR ) 25 MG tablet, Take 1 tablet (25 mg total) by mouth daily., Disp: 90 tablet, Rfl: 1   metoprolol  succinate (TOPROL -XL) 25 MG 24 hr tablet, Take 1 tablet (25 mg total) by mouth 2 (two) times daily., Disp: 60 tablet, Rfl: 0   Multiple Vitamins-Minerals (MULTIVITAMIN MEN 50+) TABS, Take 1 tablet by mouth daily., Disp: , Rfl:    nabumetone  (RELAFEN ) 750 MG tablet, Take 750 mg by mouth daily., Disp: , Rfl:    nitroGLYCERIN  (NITROSTAT ) 0.4 MG SL tablet, Place 0.4 mg under the tongue every 5 (five) minutes as needed for chest pain., Disp: , Rfl:  pantoprazole  (PROTONIX ) 40 MG tablet, Take 1 tablet (40 mg total) by mouth 2 (two) times daily., Disp: 60 tablet, Rfl: 0   Pitavastatin  Calcium  (LIVALO ) 4 MG TABS, Take 4 mg by mouth at bedtime., Disp: , Rfl:    Probiotic Product (PROBIOTIC DAILY PO), Take 1 capsule by mouth daily., Disp: , Rfl:    TAKHZYRO  300 MG/2ML SOSY, INJECT 300MG  SUBCUTANEOUSLY  EVERY 2 WEEKS, Disp: 4 mL, Rfl: 11   temazepam  (RESTORIL ) 15 MG capsule, Take 15 mg by mouth at bedtime., Disp: , Rfl:    Allergies  Allergen Reactions   Zestril  [Lisinopril ] Anaphylaxis and Swelling    Face, lips, and throat swell   Buspirone  Other (See Comments)     Didn't like how it made him feel   Roxicodone  [Oxycodone ] Other (See Comments)    Hallucinations   Tape Other (See Comments)    PLASTIC TAPE TEARS OFF THE SKIN- Please use an alternative!!   Benadryl [Diphenhydramine Hcl] Swelling and Other (See Comments)    Facial swelling from high dose (tolerates Advil  PM as needed)   Darvocet [Propoxyphene N-Acetaminophen ] Other (See Comments)    Hallucinations    Darvon [Propoxyphene Hcl] Other (See Comments)    Hallucination    Norvasc  [Amlodipine ] Palpitations, Rash and Other (See Comments)    Heart pounding Headache  Rash on ankles   Tylenol  [Acetaminophen ] Swelling and Other (See Comments)    Foot swelling    Past Medical History:  Diagnosis Date   Abnormal EKG 02/21/2015   Acute lower GI bleeding 05/28/2019   Anginal pain 2016   Angio-edema    Anxiety    Arthritis    CAD S/P percutaneous coronary angioplasty    NSTEMI treated with LAD PCI with DES x 03 Feb 2015 Myoview low risk Nov 2017   Cataract    Cerebellar stroke (HCC) 06/14/2021   Cerebellar stroke (HCC) 11/2023   CHF (congestive heart failure) (HCC) 2020   after surgery only   Chronic diarrhea of unknown origin    CKD (chronic kidney disease) stage 3, GFR 30-59 ml/min 12/21/2017   Colitis 05/23/2015   Coronary artery disease 05/28/2019   CVA (cerebral vascular accident) (HCC) 11/27/2023   Diverticular hemorrhage    Diverticulosis    Dyslipidemia, goal LDL below 70 02/21/2015   hyperlipidemia    Dysrhythmia    A-fib   Enterotoxigenic Escherichia coli infection 05/24/2015   Essential hypertension 02/21/2015   Geographic tongue    GERD (gastroesophageal reflux disease)    GERD without esophagitis 06/15/2021   GI bleed 12/21/2017   Glossitis, benign migratory 12/04/2018   Gout    Hematochezia 12/21/2017   Admitted Sept 2019 with lower GI bleeding felt to be secondary to hemorrhoids   Hereditary angioedema type 2 (HCC) 07/28/2017   High cholesterol    History  of kidney stones    History of non-ST elevation myocardial infarction (NSTEMI) 02/22/2015   Nov 2016   History of prolonged Q-T interval on ECG    History of prostate cancer    Hoarseness 12/04/2018   Hx of umbilical hernia repair    Hypertension    Hypothyroidism    Hypothyroidism, unspecified 06/15/2021   ILD (interstitial lung disease) (HCC) 12/17/2013   ?related to chicken farm exposure or aspiration pneumonitis from GERD Previously attributed to acute interstitial pneumonitis but based on HRCT this is likely IPF, probable UIP   Lower GI bleed 06/03/2019   Malignant neoplasm of prostate (HCC) 08/07/2014   Myocardial infarction (HCC)  2016   NSTEMI (non-ST elevated myocardial infarction) (HCC) 05/21/2018   Open metatarsal fracture 09/02/2020   PAF (paroxysmal atrial fibrillation) (HCC) 06/13/2018   Post CABG- discharged on Amiodarone , he will probably not need this long term   Palpitations 03/15/2015   palpitations    Peripheral vascular disease    vericose veins   Personal history of digestive disease    gastric ulcer   Pre-diabetes    Pre-operative clearance 01/02/2018   Prostate cancer (HCC)    Prostate enlargement    S/P CABG x 3 05/28/2018    LIMA-LAD, RIMA-RCA, and Lt radial to OM.   Skin cancer    basal cell carcinoma   STEC (Shiga toxin-producing Escherichia coli) infection 05/24/2015   Stroke (cerebrum) (HCC) 06/15/2021   Tachycardia determined by examination of pulse    Thyroid  disease    Type 2 diabetes mellitus with unspecified complications (HCC)    Type 2 diabetes mellitus without complications (HCC) 06/15/2021   Urticaria      Past Surgical History:  Procedure Laterality Date   APPENDECTOMY     BACK SURGERY     CARDIAC CATHETERIZATION N/A 02/21/2015   Procedure: Left Heart Cath;  Surgeon: Dorn JINNY Lesches, MD;  Location: [facility] INVASIVE CV LAB;  Service: Cardiovascular;  Laterality: N/A;   CATARACT EXTRACTION, BILATERAL     CORONARY ARTERY BYPASS GRAFT  N/A 05/24/2018   Procedure: CORONARY ARTERY BYPASS GRAFTING (CABG), ON PUMP, TIMES THREE, USING BILATERAL INTERNAL MAMMARY ARTERY AND HARVESTED LEFT RADIAL ARTERY;  Surgeon: Lucas Dorise POUR, MD;  Location: [facility] OR;  Service: Open Heart Surgery;  Laterality: N/A;   CYSTOSCOPY WITH LITHOLAPAXY N/A 07/26/2020   Procedure: CYSTOSCOPY WITH LITHOLAPAXY WITH FULGERATION;  Surgeon: Renda Glance, MD;  Location: [facility] ORS;  Service: Urology;  Laterality: N/A;   HOLMIUM LASER APPLICATION N/A 07/26/2020   Procedure: HOLMIUM LASER APPLICATION;  Surgeon: Renda Glance, MD;  Location: [facility] ORS;  Service: Urology;  Laterality: N/A;   I & D EXTREMITY Right 09/02/2020   Procedure: IRRIGATION AND DEBRIDEMENT RIGHT 2ND AND 3RD METATARSAL;  Surgeon: Kit Rush, MD;  Location: [facility] OR;  Service: Orthopedics;  Laterality: Right;   LEFT HEART CATH AND CORONARY ANGIOGRAPHY N/A 05/22/2018   Procedure: LEFT HEART CATH AND CORONARY ANGIOGRAPHY;  Surgeon: Dann Candyce RAMAN, MD;  Location[facility] INVASIVE CV LAB;  Service: Cardiovascular;  Laterality: N/A;   NASAL SINUS SURGERY     ORIF TOE FRACTURE Right 09/02/2020   Procedure: OPEN REDUCTION INTERNAL FIXATION (ORIF) OF RIGHT OPEN 2ND AND 3RD METATARSAL (TOE) OPEN FRACTURE;  Surgeon: Kit Rush, MD;  Location: [facility] OR;  Service: Orthopedics;  Laterality: Right;   PROSTATE BIOPSY     RADIAL ARTERY HARVEST Left 05/24/2018   Procedure: RADIAL ARTERY HARVEST;  Surgeon: Lucas Dorise POUR, MD;  Location: [facility] OR;  Service: Open Heart Surgery;  Laterality: Left;   right shoulder rotator cuff repair     TEE WITHOUT CARDIOVERSION N/A 05/24/2018   Procedure: TRANSESOPHAGEAL ECHOCARDIOGRAM (TEE);  Surgeon: Lucas Dorise POUR, MD;  Location[facility];  Service: Open Heart Surgery;  Laterality: N/A;    Family History  Problem Relation Age of Onset   Cancer Mother    Stomach cancer Mother    Cancer Father        prostate   Cancer Sister    Sleep apnea Neg Hx     Social History   Tobacco Use    Smoking status: Never   Smokeless tobacco: Never  Vaping Use  Vaping status: Never Used  Substance Use Topics   Alcohol  use: Never   Drug use: No    ROS Refer to HPI for ROS details.  Objective:    Vitals: BP (!) 152/75 (BP Location: Right Arm)   Pulse 70   Temp 97.8 F (36.6 C) (Oral)   Resp 20   SpO2 96%   Physical Exam Vitals and nursing note reviewed.  Constitutional:      General: He is not in acute distress.    Appearance: Normal appearance. He is not ill-appearing or toxic-appearing.  HENT:     Head: Normocephalic.  Cardiovascular:     Rate and Rhythm: Normal rate.  Pulmonary:     Effort: Pulmonary effort is normal. No respiratory distress.  Musculoskeletal:     Right hip: Normal.     Left hip: Tenderness present. No deformity, bony tenderness or crepitus. Decreased range of motion. Normal strength.     Left upper leg: Tenderness present. No swelling, deformity or bony tenderness.     Left foot: No foot drop.  Skin:    General: Skin is warm and dry.     Capillary Refill: Capillary refill takes less than 2 seconds.  Neurological:     General: No focal deficit present.     Mental Status: He is alert and oriented to person, place, and time.  Psychiatric:        Mood and Affect: Mood normal.        Behavior: Behavior normal.     Procedures  No results found for this or any previous visit (from the past 24 hours).  Assessment and Plan :     Discharge Instructions       1. Contusion of left hip region (Primary) - DG Femur Min 2 Views Left x-ray performed in UC shows no acute fracture or dislocation to the left hip or femur. - Use over-the-counter topical pain relieving ointment as needed for pain and inflammation control.  -Continue to monitor symptoms for any change in severity if there is any escalation of current symptoms or development of new symptoms follow-up in ER for further evaluation and management.      Deloyce Walthers B Elgar Scoggins    Stiven Kaspar, [place], [place] 03/08/24 1459

## 2024-03-10 ENCOUNTER — Telehealth: Payer: Self-pay

## 2024-03-10 DIAGNOSIS — Z9181 History of falling: Secondary | ICD-10-CM

## 2024-03-10 NOTE — Patient Outreach (Signed)
 Telephone outreach to patient to obtain mRS was successfully completed. MRS= 3  While on call with patient placed an referral for VBCI CM based off pt informing me of current obstacles at home.  Shereen Gin [facility]  Population Health VBCI Assistant Direct Dial: [phone]  Fax: [phone] Website: [email or web address]

## 2024-03-12 DIAGNOSIS — E538 Deficiency of other specified B group vitamins: Secondary | ICD-10-CM | POA: Diagnosis not present

## 2024-03-17 ENCOUNTER — Telehealth: Payer: Self-pay

## 2024-03-17 NOTE — Progress Notes (Signed)
 Complex Care Management Note  Care Guide Note 03/17/2024 Name: Daniel Herrera MRN: [id number] DOB: 01-11-1941  Daniel Herrera Phlegm is a 83 y.o. year old male who sees [name], [name], NP for primary care. I reached out to Lawton C Russ by phone today to offer complex care management services.  Daniel Herrera was given information about Complex Care Management services today including:   The Complex Care Management services include support from the care team which includes your Nurse Care Manager, Clinical Social Worker, or Pharmacist.  The Complex Care Management team is here to help remove barriers to the health concerns and goals most important to you. Complex Care Management services are voluntary, and the patient may decline or stop services at any time by request to their care team member.   Complex Care Management Consent Status: Patient agreed to services and verbal consent obtained.   Follow up plan:  Telephone appointment with complex care management team member scheduled for:  03/20/24 & 03/21/24.  Encounter Outcome:  Patient Scheduled  Dreama [facility]  [facility], [facility] VBCI Assistant Direct Dial: [phone]  Fax: [phone]

## 2024-03-20 ENCOUNTER — Telehealth: Payer: Self-pay | Admitting: Cardiology

## 2024-03-20 ENCOUNTER — Other Ambulatory Visit: Payer: Self-pay | Admitting: Licensed Clinical Social Worker

## 2024-03-20 NOTE — Telephone Encounter (Signed)
 Pt c/o medication issue:  1. Name of Medication:  apixaban  (ELIQUIS ) 2.5 MG TABS tablet   2. How are you currently taking this medication (dosage and times per day)? As written   3. Are you having a reaction (difficulty breathing--STAT)? No   4. What is your medication issue? Pt called in a week after taking this med, he stated getting headache. He states he has also had two nose bleeds. He asked if there is something else he can take. Please advise.

## 2024-03-20 NOTE — Patient Outreach (Addendum)
 Social Drivers of Health  Community Resource and Care Coordination Visit Note   03/20/2024  Name: Daniel Herrera MRN: [id number] DOB:04-07-1940  Situation: Referral received for [facility] needs assessment and assistance related to Assessment for a HHA in the home . I obtained verbal consent from Patient.  Visit completed with Patient on the phone.   Background:      Assessment:   Goals Addressed             This Visit's Progress    BSW VBCI Social Work Care Plan       Current SDOH Barriers:  Wants a HHA in the home to help him and his spouse out if they qualify   Interventions: Patient interviewed and appropriate screenings performed Provided patient with information about HHA [facility], [facility], [facility], and [facility] SW will inbasket the PCP about an order          Recommendation:   attend all scheduled provider appointments Wanting to get an assessment for a HHA in the home. The SW will have to get the PCP to write and order.   Follow Up Plan:   Telephone follow up appointment date/time:  04/08/2024 at 11:00 am  Daniel Herrera Daniel HEDWIG, PhD [facility], [facility] Social Worker Direct Dial: [phone]  Fax: [phone]

## 2024-03-20 NOTE — Patient Instructions (Signed)
 Visit Information  Thank you for taking time to visit with me today. Please don't hesitate to contact me if I can be of assistance to you before our next scheduled appointment.  Our next appointment is by telephone on 04/08/2024 at 11:00 am Please call the care guide team at [phone] if you need to cancel or reschedule your appointment.   Following is a copy of your care plan:   Goals Addressed             This Visit's Progress    BSW VBCI Social Work Care Plan       Current SDOH Barriers:  Wants a HHA in the home to help him and his spouse out if they qualify   Interventions: Patient interviewed and appropriate screenings performed Provided patient with information about [facility], [facility], [facility], and [facility] SW will inbasket the PCP about an order          Please call the Suicide and Crisis Lifeline: 988 call 911 if you are experiencing a Mental Health or Behavioral Health Crisis or need someone to talk to.  Patient verbalized understanding of Care plan and visit instructions communicated this visit  Tobias CHARM Maranda HEDWIG, PhD [facility], [facility] Social Worker Direct Dial: [phone]  Fax: [phone]

## 2024-03-21 ENCOUNTER — Other Ambulatory Visit: Payer: Self-pay

## 2024-03-21 NOTE — Patient Instructions (Signed)
 Visit Information  Thank you for taking time to visit with me today. Please don't hesitate to contact me if I can be of assistance to you before our next scheduled appointment.  Our next appointment is by telephone on 04/10/24 at 10 AM Please call the care guide team at [phone] if you need to cancel or reschedule your appointment.   Following is a copy of your care plan:   Goals Addressed             This Visit's Progress    VBCI RN Care Plan   On track    Problems:  Care Coordination needs related to securing [facility] aide Chronic Disease Management support and education needs related to fall risk  Goal: Over the next 30 days the Patient will work with child psychotherapist to address securing HH aide related to the management of chronic conditions as evidenced by review of electronic medical record and patient or social worker report     work with PT to improve strength and balance as evidenced by review of electronic medical record and patient or care team member report   Report no further falls as evidenced by patient report  Interventions:   Falls Interventions: Provided written and verbal education re: potential causes of falls and Fall prevention strategies Reviewed medications and discussed potential side effects of medications such as dizziness and frequent urination Advised patient of importance of notifying provider of falls Screening for signs and symptoms of depression related to chronic disease state  Assessed social determinant of health barriers Ensured patient has discussed PT with PCP. Patient reports during recent office visit, PCP stating she would order PT to start at the beginning of the year Discussed home health needs and ensured patient understands information provided by BSW  Patient Self-Care Activities:  Attend all scheduled provider appointments Call provider office for new concerns or questions  Perform all self care activities independently  Perform IADL's  (shopping, preparing meals, housekeeping, managing finances) independently Take medications as prescribed   Work with the social worker to address care coordination needs and will continue to work with the clinical team to address health care and disease management related needs  Plan:  Telephone follow up appointment with care management team member scheduled for:  04/10/24 at 10 AM             Please call the Suicide and Crisis Lifeline: 988 call 1-800-273-TALK (toll free, 24 hour hotline) if you are experiencing a Mental Health or Behavioral Health Crisis or need someone to talk to.  Patient verbalized understanding of Care plan and visit instructions communicated this visit  Rosaline Finlay, RN MSN [place]  [facility] RN Care Manager Direct Dial: [phone]  Fax: [phone]

## 2024-03-21 NOTE — Patient Outreach (Signed)
 Complex Care Management   Visit Note  03/21/2024  Name:  Daniel Herrera MRN: [id number] DOB: [birth year]  Situation: Referral received for Complex Care Management related to recent fall and assistance obtaining home health aide I obtained verbal consent from Patient.  Visit completed with Patient  on the phone  Background:   Past Medical History:  Diagnosis Date   Abnormal EKG 02/21/2015   Acute lower GI bleeding 05/28/2019   Anginal pain 2016   Angio-edema    Anxiety    Arthritis    CAD S/P percutaneous coronary angioplasty    NSTEMI treated with LAD PCI with DES x 03 Feb 2015 Myoview low risk Nov 2017   Cataract    Cerebellar stroke (HCC) 06/14/2021   Cerebellar stroke (HCC) 11/2023   CHF (congestive heart failure) (HCC) 2020   after surgery only   Chronic diarrhea of unknown origin    CKD (chronic kidney disease) stage 3, GFR 30-59 ml/min 12/21/2017   Colitis 05/23/2015   Coronary artery disease 05/28/2019   CVA (cerebral vascular accident) (HCC) 11/27/2023   Diverticular hemorrhage    Diverticulosis    Dyslipidemia, goal LDL below 70 02/21/2015   hyperlipidemia    Dysrhythmia    A-fib   Enterotoxigenic Escherichia coli infection 05/24/2015   Essential hypertension 02/21/2015   Geographic tongue    GERD (gastroesophageal reflux disease)    GERD without esophagitis 06/15/2021   GI bleed 12/21/2017   Glossitis, benign migratory 12/04/2018   Gout    Hematochezia 12/21/2017   Admitted Sept 2019 with lower GI bleeding felt to be secondary to hemorrhoids   Hereditary angioedema type 2 (HCC) 07/28/2017   High cholesterol    History of kidney stones    History of non-ST elevation myocardial infarction (NSTEMI) 02/22/2015   Nov 2016   History of prolonged Q-T interval on ECG    History of prostate cancer    Hoarseness 12/04/2018   Hx of umbilical hernia repair    Hypertension    Hypothyroidism    Hypothyroidism, unspecified 06/15/2021   ILD (interstitial lung  disease) (HCC) 12/17/2013   ?related to chicken farm exposure or aspiration pneumonitis from GERD Previously attributed to acute interstitial pneumonitis but based on HRCT this is likely IPF, probable UIP   Lower GI bleed 06/03/2019   Malignant neoplasm of prostate (HCC) 08/07/2014   Myocardial infarction [facility]) 2016   NSTEMI (non-ST elevated myocardial infarction) (HCC) 05/21/2018   Open metatarsal fracture 09/02/2020   PAF (paroxysmal atrial fibrillation) (HCC) 06/13/2018   Post CABG- discharged on Amiodarone , he will probably not need this long term   Palpitations 03/15/2015   palpitations    Peripheral vascular disease    vericose veins   Personal history of digestive disease    gastric ulcer   Pre-diabetes    Pre-operative clearance 01/02/2018   Prostate cancer (HCC)    Prostate enlargement    S/P CABG x 3 05/28/2018    LIMA-LAD, RIMA-RCA, and Lt radial to OM.   Skin cancer    basal cell carcinoma   STEC (Shiga toxin-producing Escherichia coli) infection 05/24/2015   Stroke (cerebrum) (HCC) 06/15/2021   Tachycardia determined by examination of pulse    Thyroid  disease    Type 2 diabetes mellitus with unspecified complications (HCC)    Type 2 diabetes mellitus without complications (HCC) 06/15/2021   Urticaria     Assessment: Patient Reported Symptoms:  Cognitive Cognitive Status: Able to follow simple commands, Alert and oriented to person, place,  and time, Normal speech and language skills Cognitive/Intellectual Conditions Management [RPT]: Brain Injury Brain Injury: History of stroke 2025   Health Maintenance Behaviors: Annual physical exam, Exercise, Healthy diet, Stress management Health Facilitated by: Healthy diet, Pain control, Rest, Stress management  Neurological Neurological Review of Symptoms: Other:, Headaches, Numbness (Numbness and tingling due to neuropathy) [place] Neurological Symptoms/Conditions [RPT]: History of several mini stroke and one bad  stroke which has pretty much left me paralized on my left side Neurological Management Strategies: Coping strategies, Medication therapy, Routine screening Neurological Comment: Patient reports headaches since starting Eliquis  recently. Patient has reported this to cardiology via MyChart  HEENT HEENT Symptoms Reported: Nosebleed HEENT Management Strategies: Coping strategies, Routine screening HEENT Comment: Patient reports 2 nosebleeds since starting Eliquis  recently. Patient has reported this to cardiology via MyChart    Cardiovascular Cardiovascular Symptoms Reported: No symptoms reported Does patient have uncontrolled Hypertension?: Yes Is patient checking Blood Pressure at home?: Yes Patient's Recent BP reading at home: 130/78 Cardiovascular Management Strategies: Coping strategies, Medication therapy, Routine screening, Weight management, Exercise Do You Have a Working Readable Scale?: Yes Weight: 190 lb (86.2 kg) (Patient reported) Cardiovascular Comment: Patient reports that he wears compression socks  Respiratory Respiratory Symptoms Reported: No symptoms reported Respiratory Management Strategies: Routine screening, Exercise  Endocrine Endocrine Symptoms Reported: Weakness or fatigue Is patient diabetic?: Yes Is patient checking blood sugars at home?: No Endocrine Comment: Patient confirms he does have glucometer at home in case he does need to check his blood sugar  Gastrointestinal Gastrointestinal Symptoms Reported: No symptoms reported Additional Gastrointestinal Details: Patient reports a good appetite and regular BMs. Last BM yesterday Gastrointestinal Management Strategies: Diet modification    Genitourinary Genitourinary Symptoms Reported: No symptoms reported Genitourinary Comment: Patient reports history of prostate CA s/p radiation. He denies issues related to urination and reports following with urology regularly  Integumentary Integumentary Symptoms Reported: No  symptoms reported Skin Management Strategies: Coping strategies  Musculoskeletal Musculoskelatal Symptoms Reviewed: Weakness, Unsteady gait, Difficulty walking Musculoskeletal Management Strategies: Medical device, Coping strategies, Routine screening Frieda) Musculoskeletal Comment: Patient reports a recent fall where he was sitting on the bed a slid off onto his bottom. He denies injuries from falling. Patient reports recently going back to using a walker for ambulation, was previously using a cane. Patient reports he discussed fall and unsteady gait with PCP at recent appointment, and was told by PCP they would be placing referral to hopefully get him started by the first of the year. Patient reports he continues to perform exercises provided by PT following recent stroke Falls in the past year?: Yes Number of falls in past year: 1 or less Was there an injury with Fall?: No Fall Risk Category Calculator: 1 Patient Fall Risk Level: Low Fall Risk Patient at Risk for Falls Due to: History of fall(s), Impaired balance/gait Fall risk Follow up: Falls evaluation completed, Education provided, Falls prevention discussed  Psychosocial Psychosocial Symptoms Reported: No symptoms reported     Quality of Family Relationships: helpful, involved, supportive Do you feel physically threatened by others?: No    03/21/2024    PHQ2-9 Depression Screening   Little interest or pleasure in doing things Not at all  Feeling down, depressed, or hopeless Not at all  PHQ-2 - Total Score 0  Trouble falling or staying asleep, or sleeping too much    Feeling tired or having little energy    Poor appetite or overeating     Feeling bad about yourself - or that  you are a failure or have let yourself or your family down    Trouble concentrating on things, such as reading the newspaper or watching television    Moving or speaking so slowly that other people could have noticed.  Or the opposite - being so fidgety or  restless that you have been moving around a lot more than usual    Thoughts that you would be better off dead, or hurting yourself in some way    PHQ2-9 Total Score    If you checked off any problems, how difficult have these problems made it for you to do your work, take care of things at home, or get along with other people    Depression Interventions/Treatment      Today's Vitals   03/21/24 1022  Weight: 190 lb (86.2 kg)   Pain Scale: 0-10 Pain Score: 7  Pain Type: Acute pain Pain Location: Leg Pain Orientation: Left  Medications Reviewed Today     Reviewed by Arno Rosaline SQUIBB, RN (Registered Nurse) on 03/21/24 at 1045  Med List Status: <None>   Medication Order Taking? Sig Documenting Provider Last Dose Status Informant  albuterol  (VENTOLIN  HFA) 108 (90 Base) MCG/ACT inhaler [id number] Yes Inhale 2 puffs into the lungs. Every 4-6 hours as needed [provider]  Active Self, Pharmacy Records  amLODipine  (NORVASC ) 2.5 MG tablet [id number] Yes TAKE 1 TABLET BY MOUTH EVERY DAY Revankar, Jennifer SAUNDERS, MD  Active   apixaban  (ELIQUIS ) 2.5 MG TABS tablet [id number] Yes Take 1 tablet (2.5 mg total) by mouth 2 (two) times daily. Revankar, Rajan R, MD  Active   artificial tears ophthalmic solution [id number] Yes Place 1 drop into both eyes as needed for dry eyes. de Clint Kill, Cortney E, NP  Active   aspirin  81 MG EC tablet [id number] Yes Take 1 tablet (81 mg total) by mouth daily. Ines Onetha NOVAK, MD  Active Self, Pharmacy Records  Azelastine  HCl 137 MCG/SPRAY SOLN [id number] Yes Place 1 spray into both nostrils 2 (two) times daily. [provider]  Active Self, Pharmacy Records           Med Note (GARNER, TIFFANY L   Tue Jun 06, 2022  3:07 PM)    betamethasone  valerate ointment (VALISONE ) 0.1 % [id number]  Apply 1 application topically 2 (two) times daily as needed for rash.  Patient not taking: Reported on 03/21/2024   [provider]  Active Self, Pharmacy Records   Cyanocobalamin  (VITAMIN B-12 PO) [id number] Yes Take 1 tablet by mouth daily. [provider]  Active Self, Pharmacy Records  diclofenac  Sodium (VOLTAREN ) 1 % GEL [id number] Yes Apply topically 4 (four) times daily. [provider]  Active   ezetimibe  (ZETIA ) 10 MG tablet [id number]  Take 1 tablet (10 mg total) by mouth daily.  Patient not taking: Reported on 03/21/2024   de Clint Kill, Cortney E, NP  Active   fluoruracil (CARAC ) 0.5 % cream [id number] Yes Apply to skin cancer for 1-7 days during 'flare up' as directed. Kozlow, Eric J, MD  Active Self, Pharmacy Records  fluticasone  (FLONASE ) 50 MCG/ACT nasal spray [id number] Yes Place 1 spray into both nostrils 2 (two) times daily. [provider]  Active Self, Pharmacy Records  gabapentin (NEURONTIN) 100 MG capsule [id number]  Take 100-200 mg by mouth at bedtime. [provider]  Active   icatibant  Acetate (FIRAZYR ) 30 MG/3ML injection [id number] Yes Inject 30 mg into the skin once as needed (HAE). [provider]  Active  Self, Pharmacy Records  levothyroxine  (SYNTHROID ) 100 MCG tablet [id number] Yes Take 100 mcg by mouth daily before breakfast. [provider]  Active Self, Pharmacy Records  lidocaine -prilocaine  (EMLA ) cream [id number] Yes Apply 1 Application topically as needed. Gregg Lek, MD  Active   losartan  (COZAAR ) 25 MG tablet [id number] Yes Take 1 tablet (25 mg total) by mouth daily. Revankar, Jennifer SAUNDERS, MD  Active Self, Pharmacy Records           Med Note (GARNER, TIFFANY L   Thu Oct 18, 2023  7:52 AM)    metoprolol  succinate (TOPROL -XL) 25 MG 24 hr tablet [id number] Yes Take 1 tablet (25 mg total) by mouth 2 (two) times daily. Jerilynn Daphne SAILOR, NP  Active   Multiple Vitamins-Minerals (MULTIVITAMIN MEN 50+) TABS [id number] Yes Take 1 tablet by mouth daily. [provider]  Active Self, Pharmacy Records  nabumetone  (RELAFEN ) 750 MG tablet [id number] Yes Take 750 mg by mouth daily.  [provider]  Active Self, Pharmacy Records           Med Note (GARNER, TIFFANY L   Thu Oct 18, 2023  7:52 AM)    nitroGLYCERIN  (NITROSTAT ) 0.4 MG SL tablet [id number] Yes Place 0.4 mg under the tongue every 5 (five) minutes as needed for chest pain. [provider]  Active Self, Pharmacy Records  pantoprazole  (PROTONIX ) 40 MG tablet [id number] Yes Take 1 tablet (40 mg total) by mouth 2 (two) times daily. Jerilynn Daphne SAILOR, NP  Active   Pitavastatin  Calcium  (LIVALO ) 4 MG TABS [id number] Yes Take 4 mg by mouth at bedtime. [provider]  Active Self, Pharmacy Records  Probiotic Product (PROBIOTIC DAILY PO) [id number] Yes Take 1 capsule by mouth daily. [provider]  Active Self, Pharmacy Records  TAKHZYRO  300 MG/2ML SHERMAN [id number] Yes INJECT 300MG  SUBCUTANEOUSLY  EVERY 2 WEEKS Kozlow, Camellia PARAS, MD  Active Self, Pharmacy Records  temazepam  (RESTORIL ) 15 MG capsule [id number] Yes Take 15 mg by mouth at bedtime. [provider]  Active Self, Pharmacy Records            Recommendation:   Continue Current Plan of Care  Follow Up Plan:   Telephone follow up appointment date/time:  04/10/24 at 10 AM  Rosaline Finlay, RN MSN [place]  [facility] RN Care Manager Direct Dial: [phone]  Fax: [phone]

## 2024-03-24 MED ORDER — RIVAROXABAN 15 MG PO TABS: 15.0000 mg | ORAL_TABLET | Freq: Every day | ORAL | 3 refills | Status: AC

## 2024-03-24 NOTE — Telephone Encounter (Signed)
 Recommendations reviewed with pt as per Dr. Posey note.  Pt verbalized understanding and had no additional questions.  Age 83 Weight 190 lb Creatine 1.64 Creatine clearance 40  Xarelto  dose 15 mg daily

## 2024-04-02 ENCOUNTER — Other Ambulatory Visit: Payer: Self-pay | Admitting: Cardiology

## 2024-04-08 ENCOUNTER — Other Ambulatory Visit: Payer: Self-pay | Admitting: Licensed Clinical Social Worker

## 2024-04-10 ENCOUNTER — Telehealth: Payer: Self-pay

## 2024-04-10 NOTE — Patient Outreach (Signed)
 Contacted patient for follow up, he wanted to keep call brief due to wife being in the hospital and wanted to focus on her care. Reports he still drives, has a daughter and son-in-law who offer support but they do still work.  He will await a call back from the [facility] B[facility] for home health aide resources.

## 2024-04-14 ENCOUNTER — Encounter: Payer: Self-pay | Admitting: Licensed Clinical Social Worker

## 2024-04-14 NOTE — Patient Outreach (Signed)
 Sw had to open and encounter to removes self from case  Tobias CHARM Maranda HEDWIG, PhD [facility] Social Worker Direct Dial: [phone]  Fax: [phone]

## 2024-04-15 ENCOUNTER — Other Ambulatory Visit: Payer: Self-pay

## 2024-04-15 NOTE — Patient Outreach (Signed)
 Complex Care Management   Visit Note  04/15/2024  Name:  Daniel Herrera MRN: [id number] DOB: 1940/12/23  Situation: Referral received for Complex Care Management related to fall risk I obtained verbal consent from Patient.  Visit completed with Patient  on the phone  Background:   Past Medical History:  Diagnosis Date   Abnormal EKG 02/21/2015   Acute lower GI bleeding 05/28/2019   Anginal pain 2016   Angio-edema    Anxiety    Arthritis    CAD S/P percutaneous coronary angioplasty    NSTEMI treated with LAD PCI with DES x 03 Feb 2015 Myoview low risk Nov 2017   Cataract    Cerebellar stroke (HCC) 06/14/2021   Cerebellar stroke (HCC) 11/2023   CHF (congestive heart failure) (HCC) 2020   after surgery only   Chronic diarrhea of unknown origin    CKD (chronic kidney disease) stage 3, GFR 30-59 ml/min 12/21/2017   Colitis 05/23/2015   Coronary artery disease 05/28/2019   CVA (cerebral vascular accident) (HCC) 11/27/2023   Diverticular hemorrhage    Diverticulosis    Dyslipidemia, goal LDL below 70 02/21/2015   hyperlipidemia    Dysrhythmia    A-fib   Enterotoxigenic Escherichia coli infection 05/24/2015   Essential hypertension 02/21/2015   Geographic tongue    GERD (gastroesophageal reflux disease)    GERD without esophagitis 06/15/2021   GI bleed 12/21/2017   Glossitis, benign migratory 12/04/2018   Gout    Hematochezia 12/21/2017   Admitted Sept 2019 with lower GI bleeding felt to be secondary to hemorrhoids   Hereditary angioedema type 2 (HCC) 07/28/2017   High cholesterol    History of kidney stones    History of non-ST elevation myocardial infarction (NSTEMI) 02/22/2015   Nov 2016   History of prolonged Q-T interval on ECG    History of prostate cancer    Hoarseness 12/04/2018   Hx of umbilical hernia repair    Hypertension    Hypothyroidism    Hypothyroidism, unspecified 06/15/2021   ILD (interstitial lung disease) (HCC) 12/17/2013   ?related to chicken  farm exposure or aspiration pneumonitis from GERD Previously attributed to acute interstitial pneumonitis but based on HRCT this is likely IPF, probable UIP   Lower GI bleed 06/03/2019   Malignant neoplasm of prostate (HCC) 08/07/2014   Myocardial infarction [facility]) 2016   NSTEMI (non-ST elevated myocardial infarction) (HCC) 05/21/2018   Open metatarsal fracture 09/02/2020   PAF (paroxysmal atrial fibrillation) (HCC) 06/13/2018   Post CABG- discharged on Amiodarone , he will probably not need this long term   Palpitations 03/15/2015   palpitations    Peripheral vascular disease    vericose veins   Personal history of digestive disease    gastric ulcer   Pre-diabetes    Pre-operative clearance 01/02/2018   Prostate cancer (HCC)    Prostate enlargement    S/P CABG x 3 05/28/2018    LIMA-LAD, RIMA-RCA, and Lt radial to OM.   Skin cancer    basal cell carcinoma   STEC (Shiga toxin-producing Escherichia coli) infection 05/24/2015   Stroke (cerebrum) (HCC) 06/15/2021   Tachycardia determined by examination of pulse    Thyroid  disease    Type 2 diabetes mellitus with unspecified complications (HCC)    Type 2 diabetes mellitus without complications (HCC) 06/15/2021   Urticaria     Assessment: Patient Reported Symptoms:  Cognitive Cognitive Status: Able to follow simple commands, Alert and oriented to person, place, and time, Normal speech and language  skills Cognitive/Intellectual Conditions Management [RPT]: Brain Injury Brain Injury: History of stroke 2025   Health Maintenance Behaviors: Annual physical exam, Exercise, Healthy diet, Stress management Health Facilitated by: Healthy diet, Pain control, Rest, Stress management  Neurological Neurological Review of Symptoms: Numbness, Weakness Neurological Management Strategies: Coping strategies, Medication therapy, Routine screening Neurological Comment: Continued left sided numbness due to history of stroke. Patient reports his  cardiologist switched Eliquis  to Xarelto  due to headaches and nosebleeds, but nosebleeds returned so he discontinued medication.  HEENT HEENT Symptoms Reported: No symptoms reported HEENT Management Strategies: Coping strategies, Routine screening HEENT Comment: Patient reports his cardiologist switched Eliquis  to Xarelto  due to headaches and nosebleeds, but nosebleeds returned so he discontinued medication. Offered to contact cardiologist to make aware that patient has stopped Xarelto , but he states no don't tell him, I will.    Cardiovascular Cardiovascular Symptoms Reported: No symptoms reported Does patient have uncontrolled Hypertension?: Yes Is patient checking Blood Pressure at home?: Yes Patient's Recent BP reading at home: 130/70 Cardiovascular Management Strategies: Coping strategies, Medication therapy, Routine screening, Weight management, Exercise Do You Have a Working Readable Scale?: Yes Weight: 193 lb (87.5 kg) (Patient reported)  Respiratory Respiratory Symptoms Reported: No symptoms reported Respiratory Management Strategies: Exercise, Routine screening  Endocrine Endocrine Symptoms Reported: Weakness or fatigue Is patient diabetic?: Yes Is patient checking blood sugars at home?: No    Gastrointestinal Gastrointestinal Symptoms Reported: Not assessed      Genitourinary Genitourinary Symptoms Reported: Not assessed    Integumentary Integumentary Symptoms Reported: Not assessed    Musculoskeletal Musculoskelatal Symptoms Reviewed: Difficulty walking, Unsteady gait, Weakness, Back pain Musculoskeletal Management Strategies: Medical device, Coping strategies, Routine screening Frieda) Musculoskeletal Comment: Patient reports he is not currently getting any home health services. Patient denies knowledge of PT referral by PT. Patient denies falls since previous CMRN visit. Falls in the past year?: Yes Number of falls in past year: 1 or less Was there an injury with Fall?:  No Fall Risk Category Calculator: 1 Patient Fall Risk Level: Low Fall Risk Patient at Risk for Falls Due to: History of fall(s), Impaired balance/gait Fall risk Follow up: Falls evaluation completed, Education provided, Falls prevention discussed  Psychosocial Psychosocial Symptoms Reported: Not assessed          04/15/2024    PHQ2-9 Depression Screening   Little interest or pleasure in doing things    Feeling down, depressed, or hopeless    PHQ-2 - Total Score    Trouble falling or staying asleep, or sleeping too much    Feeling tired or having little energy    Poor appetite or overeating     Feeling bad about yourself - or that you are a failure or have let yourself or your family down    Trouble concentrating on things, such as reading the newspaper or watching television    Moving or speaking so slowly that other people could have noticed.  Or the opposite - being so fidgety or restless that you have been moving around a lot more than usual    Thoughts that you would be better off dead, or hurting yourself in some way    PHQ2-9 Total Score    If you checked off any problems, how difficult have these problems made it for you to do your work, take care of things at home, or get along with other people    Depression Interventions/Treatment      Today's Vitals   04/15/24 1512  Weight: 193 lb (  87.5 kg)   Pain Scale: 0-10 Pain Score: 4  Pain Type: Chronic pain Pain Location: Back Pain Orientation: Lower  Medications Reviewed Today     Reviewed by Arno Rosaline SQUIBB, RN (Registered Nurse) on 04/15/24 at 1511  Med List Status: <None>   Medication Order Taking? Sig Documenting Provider Last Dose Status Informant  albuterol  (VENTOLIN  HFA) 108 (90 Base) MCG/ACT inhaler [id number]  Inhale 2 puffs into the lungs. Every 4-6 hours as needed [provider]  Active Self, Pharmacy Records  amLODipine  (NORVASC ) 2.5 MG tablet [id number]  TAKE 1 TABLET BY MOUTH EVERY DAY Revankar,  Rajan R, MD  Active   artificial tears ophthalmic solution [id number]  Place 1 drop into both eyes as needed for dry eyes. de Clint Kill, Cortney E, NP  Active   aspirin  81 MG EC tablet [id number]  Take 1 tablet (81 mg total) by mouth daily. Ines Onetha NOVAK, MD  Active Self, Pharmacy Records  Azelastine  HCl 137 MCG/SPRAY SOLN [id number]  Place 1 spray into both nostrils 2 (two) times daily. [provider]  Active Self, Pharmacy Records           Med Note (GARNER, TIFFANY L   Tue Jun 06, 2022  3:07 PM)    betamethasone  valerate ointment (VALISONE ) 0.1 % [id number]  Apply 1 application topically 2 (two) times daily as needed for rash.  Patient not taking: Reported on 03/21/2024   [provider]  Active Self, Pharmacy Records  Cyanocobalamin  (VITAMIN B-12 PO) [id number]  Take 1 tablet by mouth daily. [provider]  Active Self, Pharmacy Records  diclofenac  Sodium (VOLTAREN ) 1 % GEL [id number]  Apply topically 4 (four) times daily. [provider]  Active   ezetimibe  (ZETIA ) 10 MG tablet [id number]  Take 1 tablet (10 mg total) by mouth daily.  Patient not taking: Reported on 03/21/2024   de Clint Kill, Cortney E, NP  Active   fluoruracil (CARAC ) 0.5 % cream [id number]  Apply to skin cancer for 1-7 days during 'flare up' as directed. Kozlow, Eric J, MD  Active Self, Pharmacy Records  fluticasone  (FLONASE ) 50 MCG/ACT nasal spray [id number]  Place 1 spray into both nostrils 2 (two) times daily. [provider]  Active Self, Pharmacy Records  gabapentin (NEURONTIN) 100 MG capsule [id number]  Take 100-200 mg by mouth at bedtime.  Patient not taking: Reported on 03/21/2024   [provider]  Active   icatibant  Acetate (FIRAZYR ) 30 MG/3ML injection [id number]  Inject 30 mg into the skin once as needed (HAE). [provider]  Active Self, Pharmacy Records  levothyroxine  (SYNTHROID ) 100 MCG tablet [id number]  Take 100 mcg by mouth daily before breakfast.  [provider]  Active Self, Pharmacy Records  lidocaine -prilocaine  (EMLA ) cream [id number]  Apply 1 Application topically as needed. Gregg Lek, MD  Active   losartan  (COZAAR ) 25 MG tablet [id number]  Take 1 tablet (25 mg total) by mouth daily. Revankar, Jennifer SAUNDERS, MD  Active Self, Pharmacy Records           Med Note (GARNER, TIFFANY L   Thu Oct 18, 2023  7:52 AM)    metoprolol  succinate (TOPROL -XL) 25 MG 24 hr tablet [id number]  TAKE 1 TABLET BY MOUTH TWICE A DAY Revankar, Rajan R, MD  Active   Multiple Vitamins-Minerals (MULTIVITAMIN MEN 50+) TABS [id number]  Take 1 tablet by mouth daily. [provider]  Active Self, Pharmacy Records  nabumetone  (RELAFEN ) 750 MG tablet [id number]  Take 750 mg by  mouth daily. [provider]  Active Self, Pharmacy Records           Med Note (GARNER, TIFFANY L   Thu Oct 18, 2023  7:52 AM)    nitroGLYCERIN  (NITROSTAT ) 0.4 MG SL tablet [id number]  Place 0.4 mg under the tongue every 5 (five) minutes as needed for chest pain. [provider]  Active Self, Pharmacy Records  pantoprazole  (PROTONIX ) 40 MG tablet [id number]  Take 1 tablet (40 mg total) by mouth 2 (two) times daily. Jerilynn Daphne SAILOR, NP  Active   Pitavastatin  Calcium  (LIVALO ) 4 MG TABS [id number]  Take 4 mg by mouth at bedtime. [provider]  Active Self, Pharmacy Records  Probiotic Product (PROBIOTIC DAILY PO) [id number]  Take 1 capsule by mouth daily. [provider]  Active Self, Pharmacy Records  Rivaroxaban  (XARELTO ) 15 MG TABS tablet [id number]  Take 1 tablet (15 mg total) by mouth daily with supper. Revankar, Rajan R, MD  Active   TAKHZYRO  300 MG/2ML SHERMAN [id number]  INJECT 300MG  SUBCUTANEOUSLY  EVERY 2 WEEKS Kozlow, Eric J, MD  Active Self, Pharmacy Records  temazepam  (RESTORIL ) 15 MG capsule [id number]  Take 15 mg by mouth at bedtime. [provider]  Active Self, Pharmacy Records            Recommendation:   Continue Current Plan  of Care Contact your cardiologist to make them aware that you have stopped Xarelto  due to nosebleeds  Follow Up Plan:   Telephone follow up appointment date/time:  05/05/24 at 2 PM  Rosaline Finlay, RN MSN [place]  [facility] RN Care Manager Direct Dial: [phone]  Fax: [phone]

## 2024-04-15 NOTE — Patient Instructions (Signed)
 Visit Information  Thank you for taking time to visit with me today. Please don't hesitate to contact me if I can be of assistance to you before our next scheduled appointment.  Your next care management appointment is by telephone on 05/05/24 at 2 PM  Please call the care guide team at [phone] if you need to cancel, schedule, or reschedule an appointment.   Please call the Suicide and Crisis Lifeline: 988 call 1-800-273-TALK (toll free, 24 hour hotline) if you are experiencing a Mental Health or Behavioral Health Crisis or need someone to talk to.  Rosaline Finlay, RN MSN [place]  [facility] RN Care Manager Direct Dial: [phone]  Fax: [phone]   Following is a copy of your care plan:   Goals Addressed             This Visit's Progress    [facility] RN Care Plan   On track    Problems:  Care Coordination needs related to securing [facility] aide Chronic Disease Management support and education needs related to fall risk  Goal: Over the next 30 days the Patient will work with child psychotherapist to address securing HH aide related to the management of chronic conditions as evidenced by review of electronic medical record and patient or social worker report     Report no further falls as evidenced by patient report  Interventions:   Falls Interventions: Provided written and verbal education re: potential causes of falls and Fall prevention strategies Reviewed medications and discussed potential side effects of medications such as dizziness and frequent urination Advised patient of importance of notifying provider of falls Advised patient to discuss stopping Xarelto  with provider Discussed order for PT, which patient stated at previous visit was placed by PCP. Patient now reports that he is not aware of an order for PT. He denies this need right now, stating that he has previously worked with PT and has not had any falls since our previous visit Discussed home health  needs and message sent to [facility] requesting to reach back out to patient to discuss further  Patient Self-Care Activities:  Attend all scheduled provider appointments Call provider office for new concerns or questions  Perform all self care activities independently  Perform IADL's (shopping, preparing meals, housekeeping, managing finances) independently Take medications as prescribed   Work with the social worker to address care coordination needs and will continue to work with the clinical team to address health care and disease management related needs Notify your cardiologist to make them aware that you have stopped Xarelto  due to nosebleeds. Offered to send message to cardiologist to make aware, but patient prefers to report this himself  Plan:  Telephone follow up appointment with care management team member scheduled for:  05/05/24 at 2 PM             Patient verbalized understanding of Care plan and visit instructions communicated this visit

## 2024-05-05 ENCOUNTER — Other Ambulatory Visit: Payer: Self-pay

## 2024-06-02 ENCOUNTER — Telehealth

## 2024-08-07 ENCOUNTER — Ambulatory Visit: Admitting: Allergy and Immunology
